# Patient Record
Sex: Female | Born: 1960 | Race: White | Hispanic: No | Smoking: Current every day smoker
Health system: Southern US, Community
[De-identification: ages and names within clinical notes are randomized; demographics above are authoritative.]

## PROBLEM LIST (undated history)

## (undated) DIAGNOSIS — G4733 Obstructive sleep apnea (adult) (pediatric): Secondary | ICD-10-CM

## (undated) DIAGNOSIS — F419 Anxiety disorder, unspecified: Secondary | ICD-10-CM

## (undated) DIAGNOSIS — G4730 Sleep apnea, unspecified: Secondary | ICD-10-CM

## (undated) DIAGNOSIS — G709 Myoneural disorder, unspecified: Secondary | ICD-10-CM

## (undated) DIAGNOSIS — D649 Anemia, unspecified: Secondary | ICD-10-CM

## (undated) DIAGNOSIS — F319 Bipolar disorder, unspecified: Secondary | ICD-10-CM

## (undated) DIAGNOSIS — N183 Chronic kidney disease, stage 3 unspecified: Secondary | ICD-10-CM

## (undated) DIAGNOSIS — F329 Major depressive disorder, single episode, unspecified: Secondary | ICD-10-CM

## (undated) DIAGNOSIS — F0390 Unspecified dementia without behavioral disturbance: Secondary | ICD-10-CM

## (undated) DIAGNOSIS — R569 Unspecified convulsions: Secondary | ICD-10-CM

## (undated) DIAGNOSIS — I639 Cerebral infarction, unspecified: Secondary | ICD-10-CM

## (undated) DIAGNOSIS — M1990 Unspecified osteoarthritis, unspecified site: Secondary | ICD-10-CM

## (undated) DIAGNOSIS — E0590 Thyrotoxicosis, unspecified without thyrotoxic crisis or storm: Secondary | ICD-10-CM

## (undated) DIAGNOSIS — N321 Vesicointestinal fistula: Secondary | ICD-10-CM

## (undated) DIAGNOSIS — G629 Polyneuropathy, unspecified: Secondary | ICD-10-CM

## (undated) DIAGNOSIS — Z87442 Personal history of urinary calculi: Secondary | ICD-10-CM

## (undated) DIAGNOSIS — F32A Depression, unspecified: Secondary | ICD-10-CM

## (undated) DIAGNOSIS — R519 Headache, unspecified: Secondary | ICD-10-CM

## (undated) DIAGNOSIS — R0600 Dyspnea, unspecified: Secondary | ICD-10-CM

## (undated) DIAGNOSIS — I2510 Atherosclerotic heart disease of native coronary artery without angina pectoris: Secondary | ICD-10-CM

## (undated) DIAGNOSIS — L4050 Arthropathic psoriasis, unspecified: Secondary | ICD-10-CM

## (undated) DIAGNOSIS — K219 Gastro-esophageal reflux disease without esophagitis: Secondary | ICD-10-CM

## (undated) HISTORY — PX: TOTAL HIP ARTHROPLASTY: SHX124

## (undated) HISTORY — DX: Sleep apnea, unspecified: G47.30

## (undated) HISTORY — DX: Obstructive sleep apnea (adult) (pediatric): G47.33

## (undated) HISTORY — PX: CHOLECYSTECTOMY: SHX55

## (undated) HISTORY — DX: Polyneuropathy, unspecified: G62.9

## (undated) HISTORY — PX: FOOT SURGERY: SHX648

## (undated) HISTORY — DX: Chronic kidney disease, stage 3 unspecified: N18.30

## (undated) HISTORY — PX: APPENDECTOMY: SHX54

## (undated) HISTORY — DX: Bipolar disorder, unspecified: F31.9

## (undated) HISTORY — PX: ABDOMINAL HYSTERECTOMY: SHX81

## (undated) HISTORY — DX: Depression, unspecified: F32.A

## (undated) HISTORY — DX: Unspecified osteoarthritis, unspecified site: M19.90

## (undated) HISTORY — DX: Arthropathic psoriasis, unspecified: L40.50

## (undated) HISTORY — PX: TONSILLECTOMY: SUR1361

## (undated) HISTORY — DX: Anxiety disorder, unspecified: F41.9

## (undated) HISTORY — DX: Major depressive disorder, single episode, unspecified: F32.9

---

## 2000-09-15 LAB — COLOGUARD: Cologuard: NEGATIVE

## 2014-10-29 DIAGNOSIS — I10 Essential (primary) hypertension: Secondary | ICD-10-CM | POA: Insufficient documentation

## 2014-11-05 DIAGNOSIS — M1611 Unilateral primary osteoarthritis, right hip: Secondary | ICD-10-CM | POA: Insufficient documentation

## 2015-12-10 ENCOUNTER — Ambulatory Visit (INDEPENDENT_AMBULATORY_CARE_PROVIDER_SITE_OTHER): Payer: Medicare Other

## 2015-12-10 ENCOUNTER — Ambulatory Visit (INDEPENDENT_AMBULATORY_CARE_PROVIDER_SITE_OTHER): Payer: Medicare Other | Admitting: Podiatry

## 2015-12-10 ENCOUNTER — Encounter: Payer: Self-pay | Admitting: Podiatry

## 2015-12-10 VITALS — BP 144/88 | HR 83 | Resp 16

## 2015-12-10 DIAGNOSIS — M2041 Other hammer toe(s) (acquired), right foot: Secondary | ICD-10-CM | POA: Diagnosis not present

## 2015-12-10 DIAGNOSIS — Q828 Other specified congenital malformations of skin: Secondary | ICD-10-CM | POA: Diagnosis not present

## 2015-12-10 DIAGNOSIS — M7751 Other enthesopathy of right foot: Secondary | ICD-10-CM | POA: Diagnosis not present

## 2015-12-10 DIAGNOSIS — M779 Enthesopathy, unspecified: Secondary | ICD-10-CM

## 2015-12-10 DIAGNOSIS — M778 Other enthesopathies, not elsewhere classified: Secondary | ICD-10-CM

## 2015-12-10 NOTE — Progress Notes (Signed)
   Subjective:    Patient ID: Valerie [name], female    DOB: February 24, 1961, 55 y.o.   MRN: [id number]  HPI: She presents today with a long history of a painful callus between the fourth and fifth digits of the right foot. She states that she has had her toenail technicians work on this occasionally which would alleviate the pain for only a day or so at a time. She states that she now she needs to have someone else look at it. She denies any trauma.    Review of Systems  Cardiovascular: Positive for leg swelling.  All other systems reviewed and are negative.      Objective:   Physical Exam: Vital signs are stable alert and oriented 3. Pulses are palpable. Neurologic sensory was intact. Deep tendon reflexes are intact and muscle strength is normal bilateral. Orthopedic evaluation and industries all joints distal to the ankle for range of motion without crepitation. She does have a very deep webspace fourth interdigital space. Radiograph confirms a very short fifth metatarsal resulting in a short fifth toe and a juxtaposition is resulting in the skin breakdown and pain on palpation of the fifth metatarsophalangeal joint. The hypertrophic callus between the fourth and fifth digits is deep within sulcus and there appears to be skin breakdown overlying the PIPJ medially. There is no signs of infection.        Assessment & Plan:  Assessment: Capsulitis fifth metatarsophalangeal joint and soft corn between the toes and a superficial ulceration.  Plan: I injected dexamethasone in in and about the fifth metatarsophalangeal joint. There is no erythema or edema saline as drainage or odor in this area. I then debrided all reactive hyperkeratoses and placed padding and made suggestions. I also discussed the need for possible surgery in the future. She understands this is amenable to it will follow up with me as needed.

## 2016-03-24 ENCOUNTER — Encounter: Payer: Self-pay | Admitting: Podiatry

## 2016-03-24 ENCOUNTER — Ambulatory Visit (INDEPENDENT_AMBULATORY_CARE_PROVIDER_SITE_OTHER): Payer: Medicare Other | Admitting: Podiatry

## 2016-03-24 ENCOUNTER — Other Ambulatory Visit: Payer: Self-pay | Admitting: Podiatry

## 2016-03-24 DIAGNOSIS — Q828 Other specified congenital malformations of skin: Secondary | ICD-10-CM | POA: Diagnosis not present

## 2016-03-24 DIAGNOSIS — M2041 Other hammer toe(s) (acquired), right foot: Secondary | ICD-10-CM

## 2016-03-24 DIAGNOSIS — Z1231 Encounter for screening mammogram for malignant neoplasm of breast: Secondary | ICD-10-CM

## 2016-03-24 NOTE — Patient Instructions (Signed)
Pre-Operative Instructions  Congratulations, you have decided to take an important step to improving your quality of life.  You can be assured that the doctors of [facility] will be with you every step of the way.  1. Plan to be at the surgery center/hospital at least 1 (one) hour prior to your scheduled time unless otherwise directed by the surgical center/hospital staff.  You must have a responsible adult accompany you, remain during the surgery and drive you home.  Make sure you have directions to the surgical center/hospital and know how to get there on time. 2. For hospital based surgery you will need to obtain a history and physical form from your family physician within 1 month prior to the date of surgery- we will give you a form for you primary physician.  3. We make every effort to accommodate the date you request for surgery.  There are however, times where surgery dates or times have to be moved.  We will contact you as soon as possible if a change in schedule is required.   4. No Aspirin/Ibuprofen for one week before surgery.  If you are on aspirin, any non-steroidal anti-inflammatory medications (Mobic, Aleve, Ibuprofen) you should stop taking it 7 days prior to your surgery.  You make take Tylenol  For pain prior to surgery.  5. Medications- If you are taking daily heart and blood pressure medications, seizure, reflux, allergy, asthma, anxiety, pain or diabetes medications, make sure the surgery center/hospital is aware before the day of surgery so they may notify you which medications to take or avoid the day of surgery. 6. No food or drink after midnight the night before surgery unless directed otherwise by surgical center/hospital staff. 7. No alcoholic beverages 24 hours prior to surgery.  No smoking 24 hours prior to or 24 hours after surgery. 8. Wear loose pants or shorts- loose enough to fit over bandages, boots, and casts. 9. No slip on shoes, sneakers are best. 10. Bring  your boot with you to the surgery center/hospital.  Also bring crutches or a walker if your physician has prescribed it for you.  If you do not have this equipment, it will be provided for you after surgery. 11. If you have not been contracted by the surgery center/hospital by the day before your surgery, call to confirm the date and time of your surgery. 12. Leave-time from work may vary depending on the type of surgery you have.  Appropriate arrangements should be made prior to surgery with your employer. 13. Prescriptions will be provided immediately following surgery by your doctor.  Have these filled as soon as possible after surgery and take the medication as directed. 14. Remove nail polish on the operative foot. 15. Wash the night before surgery.  The night before surgery wash the foot and leg well with the antibacterial soap provided and water paying special attention to beneath the toenails and in between the toes.  Rinse thoroughly with water and dry well with a towel.  Perform this wash unless told not to do so by your physician.  Enclosed: 1 Ice pack (please put in freezer the night before surgery)   1 Hibiclens skin cleaner   Pre-op Instructions  If you have any questions regarding the instructions, do not hesitate to call our office.  [place]: [address] , [place] [address] [phone]  [place]: [address] [place], [place] [address] [phone]  [place]: [address]  [place], [place] [address] [phone]   Dr.   Norman Regal DPM, Dr. Matthew Wagoner DPM, Dr. M. Todd Hyatt DPM, Dr. Titorya Stover DPM 

## 2016-03-24 NOTE — Progress Notes (Signed)
She presents today with a chief complaint of recurrence of the painful lesion to the fifth digit medial aspect right foot. She states that she would like to have this permanently corrected.  Objective: Vital signs are stable alert and oriented 3. Pulses are palpable. Neurologic sensorium is intact. Deep tendon reflexes are intact. Orthopedic evaluation was resolved just distal to the ankle for range of motion or crepitus she has adductovarus rotated hammertoe deformity fifth digit of the right foot which is resulting in a reactive hyperkeratotic lesion medial aspect of the fifth toe with pain.  Assessment: Chronic hammertoe deformity with adductovarus rotation and reactive hyperkeratotic/4. Keratosis.  Plan: We debrided the area of reactive hyperkeratosis today and consented her for surgical correction. We discussed orthoplasty fifth digit of the right foot we discussed the possible postop complications which may include but are not limited to postop pain bleeding swelling infection recurrence possible need for further surgery overcorrection under correction recurrence of the condition. Loss of digit loss of limb loss of life. She signed all 3 pages of the consent form and was dispensed a Darco shoe.

## 2016-04-06 ENCOUNTER — Telehealth: Payer: Self-pay | Admitting: *Deleted

## 2016-04-06 NOTE — Telephone Encounter (Signed)
"  The request from Dr. Milinda Pointer for Valerie [name] has been approved.  Request was approved for cpt code 28285 on August 14 to September 13 of 2017 as outpatient.  Authorization number is [id number].  If you have questions please contact [facility] at [phone] if you have additional questions.

## 2016-04-07 NOTE — Telephone Encounter (Signed)
I called Caren Griffins at surgical center and informed her of Authorization number.

## 2016-04-09 ENCOUNTER — Ambulatory Visit
Admission: RE | Admit: 2016-04-09 | Discharge: 2016-04-09 | Disposition: A | Discharge status: Home / Self Care | Payer: Medicare Other | Source: Ambulatory Visit | Attending: Podiatry | Admitting: Podiatry

## 2016-04-09 ENCOUNTER — Encounter: Payer: Self-pay | Admitting: Radiology

## 2016-04-09 DIAGNOSIS — Z1231 Encounter for screening mammogram for malignant neoplasm of breast: Secondary | ICD-10-CM

## 2016-04-16 ENCOUNTER — Other Ambulatory Visit: Payer: Self-pay | Admitting: Podiatry

## 2016-04-16 MED ORDER — HYDROCODONE-ACETAMINOPHEN 10-325 MG PO TABS: 1.0000 | ORAL_TABLET | Freq: Four times a day (QID) | ORAL | 0 refills | Status: DC | PRN

## 2016-04-16 MED ORDER — CLINDAMYCIN HCL 150 MG PO CAPS: 150.0000 mg | ORAL_CAPSULE | Freq: Three times a day (TID) | ORAL | 0 refills | Status: DC

## 2016-04-17 ENCOUNTER — Encounter: Payer: Self-pay | Admitting: Podiatry

## 2016-04-17 DIAGNOSIS — M2041 Other hammer toe(s) (acquired), right foot: Secondary | ICD-10-CM | POA: Diagnosis not present

## 2016-04-20 ENCOUNTER — Telehealth: Payer: Self-pay | Admitting: *Deleted

## 2016-04-20 NOTE — Telephone Encounter (Signed)
Post op courtesy call-Left message encouraging pt to read and follow the post op instruction sheet. I reiterated not to weight bear or have hove foot

## 2016-04-21 ENCOUNTER — Ambulatory Visit (INDEPENDENT_AMBULATORY_CARE_PROVIDER_SITE_OTHER): Payer: Medicare Other

## 2016-04-21 ENCOUNTER — Ambulatory Visit (INDEPENDENT_AMBULATORY_CARE_PROVIDER_SITE_OTHER): Payer: Medicare Other | Admitting: Podiatry

## 2016-04-21 ENCOUNTER — Encounter: Payer: Self-pay | Admitting: Podiatry

## 2016-04-21 VITALS — BP 145/92 | HR 74 | Resp 16

## 2016-04-21 DIAGNOSIS — M2041 Other hammer toe(s) (acquired), right foot: Secondary | ICD-10-CM

## 2016-04-21 DIAGNOSIS — Z9889 Other specified postprocedural states: Secondary | ICD-10-CM

## 2016-04-21 NOTE — Progress Notes (Unsigned)
DOS 08/252017 Arthroplasty 5th toe right foot.

## 2016-04-22 NOTE — Progress Notes (Signed)
She presents today for her first postop visit status post hammertoe repair fifth right. She states that she's doing just fine.  Objective: Vital signs are stable she is alert and oriented 3 no erythema edema saline as drainage or odor. Pulses are strongly palpable. Neurologic sensorium is intact. Redressed in a straighter complete arthroplasty PIPJ fifth right.  Assessment: Well-healing surgical toe less than 1 week.  Plan: She is to follow up with [place] in 1 week at which time sutures will probably not be removed. We may redress it and allow her to start washing.

## 2016-04-23 ENCOUNTER — Encounter: Payer: Self-pay | Admitting: Podiatry

## 2016-04-30 ENCOUNTER — Ambulatory Visit (INDEPENDENT_AMBULATORY_CARE_PROVIDER_SITE_OTHER): Payer: Medicare Other | Admitting: Podiatry

## 2016-04-30 ENCOUNTER — Encounter: Payer: Self-pay | Admitting: Podiatry

## 2016-04-30 DIAGNOSIS — M2041 Other hammer toe(s) (acquired), right foot: Secondary | ICD-10-CM

## 2016-04-30 DIAGNOSIS — Z9889 Other specified postprocedural states: Secondary | ICD-10-CM

## 2016-04-30 NOTE — Progress Notes (Signed)
She presents today 2 weeks status post hammertoe repair fifth right. She denies fever chills nausea vomiting muscle aches and pains.  Objective: Vital signs are stable alert and oriented 3. Pulses are palpable. Sutures are intact margins well coapted. We removed all of the sutures today margins remain well coapted. No signs of infection.  Assessment: Well-healing surgical toe fifth right.  Plan: I placed a compression dressing today and instructed her on how to do so. She will continue the use of the Darco shoe for least 2 more weeks I will follow up with her for another set of x-rays at that time.

## 2016-05-14 ENCOUNTER — Ambulatory Visit (INDEPENDENT_AMBULATORY_CARE_PROVIDER_SITE_OTHER): Payer: Medicare Other | Admitting: Podiatry

## 2016-05-14 ENCOUNTER — Ambulatory Visit (INDEPENDENT_AMBULATORY_CARE_PROVIDER_SITE_OTHER): Payer: Medicare Other

## 2016-05-14 DIAGNOSIS — M2041 Other hammer toe(s) (acquired), right foot: Secondary | ICD-10-CM | POA: Diagnosis not present

## 2016-05-14 DIAGNOSIS — Z9889 Other specified postprocedural states: Secondary | ICD-10-CM

## 2016-05-14 NOTE — Progress Notes (Signed)
She presents today one month status post hammertoe repair fifth digit right foot. She states this seems to be doing just fine and feels much better than it did previously and unable to wear shoes.  Objective: Vital signs are stable alert and oriented 3. Pulses are strongly palpable. Neurologic sensorium is intact. Mild edema to the fifth digit of the right foot no skin breakdown or reactive hyperkeratoses noted.  Assessment: Well-healing surgical toe right fifths.  Plan: Follow up with me in 1 month if necessary.

## 2016-06-11 ENCOUNTER — Ambulatory Visit (INDEPENDENT_AMBULATORY_CARE_PROVIDER_SITE_OTHER): Payer: Medicare Other

## 2016-06-11 ENCOUNTER — Ambulatory Visit (INDEPENDENT_AMBULATORY_CARE_PROVIDER_SITE_OTHER): Payer: Medicare Other | Admitting: Podiatry

## 2016-06-11 DIAGNOSIS — Z9889 Other specified postprocedural states: Secondary | ICD-10-CM

## 2016-06-11 DIAGNOSIS — M2041 Other hammer toe(s) (acquired), right foot: Secondary | ICD-10-CM | POA: Diagnosis not present

## 2016-06-13 NOTE — Progress Notes (Signed)
Subjective: Patient presents today status post hammertoe repair fifth digit right foot patient states that she's doing well  Objective: Mild edema noted to the fifth digit right foot. No skin breakdown or hypertrophic scar reaction.  Assessment: Status post fifth digit hammertoe repair right foot  Plan of care: Patient doing well and extremely satisfied. Complete healing has occurred exception of minimal edema. Patient is to return to clinic as needed

## 2016-07-13 ENCOUNTER — Ambulatory Visit (INDEPENDENT_AMBULATORY_CARE_PROVIDER_SITE_OTHER): Payer: Medicare Other

## 2016-07-13 ENCOUNTER — Encounter: Payer: Self-pay | Admitting: Podiatry

## 2016-07-13 ENCOUNTER — Ambulatory Visit (INDEPENDENT_AMBULATORY_CARE_PROVIDER_SITE_OTHER): Payer: Medicare Other | Admitting: Podiatry

## 2016-07-13 DIAGNOSIS — M7751 Other enthesopathy of right foot: Secondary | ICD-10-CM | POA: Diagnosis not present

## 2016-07-13 DIAGNOSIS — M778 Other enthesopathies, not elsewhere classified: Secondary | ICD-10-CM

## 2016-07-13 DIAGNOSIS — M779 Enthesopathy, unspecified: Secondary | ICD-10-CM

## 2016-07-13 DIAGNOSIS — M79671 Pain in right foot: Secondary | ICD-10-CM | POA: Diagnosis not present

## 2016-07-13 DIAGNOSIS — R52 Pain, unspecified: Secondary | ICD-10-CM

## 2016-07-13 DIAGNOSIS — M792 Neuralgia and neuritis, unspecified: Secondary | ICD-10-CM

## 2016-07-13 NOTE — Progress Notes (Signed)
Subjective: Patient presents to the office today for concerns of pain to the top of her right foot and for numbness and a sharp pain going in pain her first and second toes of her right foot. This been ongoing for about 1 week. Denies any recent injury or trauma. The pain started she was coming down steps but she denies twisting her foot or ankle or falling. She said no recent treatment for this. She is doing very well from the fifth toe. Denies any systemic complaints such as fevers, chills, nausea, vomiting. No acute changes since last appointment, and no other complaints at this time.   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 There is mild diffuse tenderness the dorsal aspect of the right midfoot along Lisfranc joint. There is trace edema to the area any erythema or increase in warmth. Subjective there is numbness/tingling to the big toe and second toe on the course of the deep peroneal nerve. There is no other areas tenderness bilaterally. No open lesions or pre-ulcerative lesions.  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  Plan: -All treatment options discussed with the patient including all alternatives, risks, complications.  -X-rays were obtained and reviewed with the patient. No evidence of acute fracture. Cavus foot type is present. -Discussed steroid injection she wishes to proceed. Under sterile conditions a mix of dexamethasone local anesthetic was infiltrated into the area of maximal tarsal complications. Post injection care was discussed. -Prescribed compound cream through [facility] for anti-inflammatory.  -Ice -Discussed shoe gear modifications and to re-lace her shoes to avoid pressure to the area. -Follow-up in 4 weeks if symptoms continue or sooner if needed. -Patient encouraged to call the office with any  questions, concerns, change in symptoms.   Celesta Gentile, DPM

## 2016-07-21 ENCOUNTER — Ambulatory Visit: Payer: Medicare Other | Admitting: Podiatry

## 2016-11-05 ENCOUNTER — Encounter: Payer: Self-pay | Admitting: Podiatry

## 2016-11-05 ENCOUNTER — Ambulatory Visit (INDEPENDENT_AMBULATORY_CARE_PROVIDER_SITE_OTHER): Payer: Medicare Other | Admitting: Podiatry

## 2016-11-05 DIAGNOSIS — M792 Neuralgia and neuritis, unspecified: Secondary | ICD-10-CM

## 2016-11-05 NOTE — Progress Notes (Signed)
She presents today with a chief complaint of pain to the dorsal aspect of the right foot. States that it feels like lightening bolt.   Objective: Chest pain on palpation deep peroneal nerve of the right foot with underlying osteoarthritic changes.  Assessment: Deep peroneal nerve neuritis.  Plan: Initiate her first dose of dehydrated alcohol to the deep peroneal nerve. Follow-up with her in 3-4 weeks for her second dose of necessary.

## 2016-12-03 ENCOUNTER — Ambulatory Visit: Payer: Medicare Other | Admitting: Podiatry

## 2017-01-25 ENCOUNTER — Other Ambulatory Visit: Payer: Self-pay | Admitting: Obstetrics and Gynecology

## 2017-01-25 DIAGNOSIS — Z1231 Encounter for screening mammogram for malignant neoplasm of breast: Secondary | ICD-10-CM

## 2017-04-12 ENCOUNTER — Ambulatory Visit
Admission: RE | Admit: 2017-04-12 | Discharge: 2017-04-12 | Disposition: A | Discharge status: Home / Self Care | Payer: Medicare Other | Source: Ambulatory Visit | Attending: Obstetrics and Gynecology | Admitting: Obstetrics and Gynecology

## 2017-04-12 DIAGNOSIS — Z1231 Encounter for screening mammogram for malignant neoplasm of breast: Secondary | ICD-10-CM

## 2018-02-03 ENCOUNTER — Ambulatory Visit: Payer: Medicare Other | Admitting: Podiatry

## 2018-02-08 ENCOUNTER — Ambulatory Visit: Payer: Medicare Other | Admitting: Podiatry

## 2018-02-08 ENCOUNTER — Encounter: Payer: Self-pay | Admitting: Podiatry

## 2018-02-08 ENCOUNTER — Encounter

## 2018-02-08 ENCOUNTER — Ambulatory Visit: Payer: Medicare Other

## 2018-02-08 ENCOUNTER — Telehealth: Payer: Self-pay | Admitting: *Deleted

## 2018-02-08 DIAGNOSIS — M7989 Other specified soft tissue disorders: Secondary | ICD-10-CM

## 2018-02-08 DIAGNOSIS — Z01812 Encounter for preprocedural laboratory examination: Secondary | ICD-10-CM

## 2018-02-08 DIAGNOSIS — M799 Soft tissue disorder, unspecified: Secondary | ICD-10-CM | POA: Diagnosis not present

## 2018-02-08 DIAGNOSIS — M67479 Ganglion, unspecified ankle and foot: Secondary | ICD-10-CM

## 2018-02-08 LAB — BUN/CREATININE RATIO
BUN/Creatinine Ratio: 16 (calc) (ref 6–22)
BUN: 28 mg/dL — AB (ref 7–25)
CREATININE: 1.73 mg/dL — AB (ref 0.50–1.05)
GFR, EST AFRICAN AMERICAN: 37 mL/min/{1.73_m2} — AB (ref >=60)
GFR, EST NON AFRICAN AMERICAN: 32 mL/min/{1.73_m2} — AB (ref >=60)

## 2018-02-08 NOTE — Progress Notes (Signed)
She presents today with a chief complaint of a painful area to the dorsal aspect of the right foot.  She says I think have another cyst or something and is sending lightening bolts to the top of my foot down to my toes that she points to the first and second toes of the right foot.  She denies any trauma to the foot.  Objective: Vital signs are stable she is alert and oriented x3.  Pulses are palpable.  Neurologic sensorium is intact.  Degenerative flexors are intact.  Muscle strength is normal symmetrical bilateral.  She has pain on palpation of the deep peroneal nerve as it courses across the dorsum of the foot but exquisitely painful between the first intermetatarsal spaces.  There is a small mass in the first intermetatarsal space appears to be fluctuant sort of like a ganglion or a bursa but too painful on palpation to try to aspirate.  Typically ganglion cyst is not painful to palpate.  Assessment: Soft tissue mass first intermetatarsal space right foot.  Cannot rule out a neural mass or ganglion cyst.  Plan: Requesting an MRI with contrast we will look at her BUN and creatinine because of her history of kidney disease.  I will follow-up with her once that comes in.

## 2018-02-08 NOTE — Addendum Note (Signed)
Addended by: Clovis Riley E on: 02/08/2018 10:33 AM   Modules accepted: Orders

## 2018-02-08 NOTE — Telephone Encounter (Signed)
-----   Message from Rip Harbour, [facility] sent at 02/08/2018  9:54 AM EDT ----- Regarding: MRI MRI with contrast right - evaluate 1st intermetatarsal space, soft tissue tumor - surgical consideration  Patient sent for BUN and Creat.

## 2018-02-08 NOTE — Telephone Encounter (Signed)
Orders to [facility] for pre-cert and faxed to [place].

## 2018-02-09 ENCOUNTER — Telehealth: Payer: Self-pay | Admitting: *Deleted

## 2018-02-09 DIAGNOSIS — M7989 Other specified soft tissue disorders: Secondary | ICD-10-CM

## 2018-02-09 NOTE — Telephone Encounter (Signed)
-----   Message from Garrel Ridgel, [place] sent at 02/09/2018  6:57 AM EDT ----- She is probably better of not to have the contrast.  Might want to call her doc and let them decide.

## 2018-02-09 NOTE — Telephone Encounter (Signed)
Changed pt's MRI to without contrast.

## 2018-02-09 NOTE — Telephone Encounter (Signed)
Faxed notice to cancel the MRI with and Without contrast for the right foot, and change to the MRI without contrast of the right foot.

## 2018-02-17 ENCOUNTER — Telehealth: Payer: Self-pay | Admitting: Podiatry

## 2018-02-17 NOTE — Telephone Encounter (Signed)
Orders that was sent needs to be changed per [facility] -to MRI right foot with & without contrast.

## 2018-02-17 NOTE — Telephone Encounter (Signed)
I informed [facility] Dr. Milinda Pointer had okayed wo contrast.

## 2018-02-28 ENCOUNTER — Other Ambulatory Visit: Payer: Self-pay | Admitting: Obstetrics and Gynecology

## 2018-02-28 DIAGNOSIS — Z1231 Encounter for screening mammogram for malignant neoplasm of breast: Secondary | ICD-10-CM

## 2018-03-07 ENCOUNTER — Ambulatory Visit
Admission: RE | Admit: 2018-03-07 | Discharge: 2018-03-07 | Disposition: A | Discharge status: Home / Self Care | Payer: Medicare Other | Source: Ambulatory Visit | Attending: Podiatry | Admitting: Podiatry

## 2018-03-07 DIAGNOSIS — M7989 Other specified soft tissue disorders: Secondary | ICD-10-CM

## 2018-03-09 ENCOUNTER — Ambulatory Visit: Payer: Medicare Other | Admitting: Podiatry

## 2018-03-09 DIAGNOSIS — M898X9 Other specified disorders of bone, unspecified site: Secondary | ICD-10-CM

## 2018-03-09 DIAGNOSIS — M7989 Other specified soft tissue disorders: Secondary | ICD-10-CM

## 2018-03-09 DIAGNOSIS — M799 Soft tissue disorder, unspecified: Secondary | ICD-10-CM

## 2018-03-09 NOTE — Progress Notes (Signed)
She presents today with chief complaint of painful radiating pain from the dorsal aspect of her right foot through the first intermetatarsal space and out to her toes.  She is also here for her MRI review.  Objective: Vital signs are stable she is alert and oriented x3 reproducible pain on palpation to the dorsal aspect of the arthritic midfoot.  She also has a palpable mass between the first and second metatarsals.  Palpation of this mass is exquisitely painful with radiating pain.  MRI does demonstrate soft tissue mass in the first intermetatarsal space consistent with a ganglion cyst also demonstrates a entrapment of the deep peroneal nerve.  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  Plan: Discussed etiology pathology conservative surgical therapies at this point I am going to consent her for decompression of the nerve consisting of a dorsal tarsal exostectomy as well as a excision ganglion cyst.  We discussed the pros and cons of the surgery and possible complications associated with that she understands is amenable to it signed all 3 pages a consent form dispensed both oral and written home-going instruction for care and preop as well as anesthesia group and surgery center.  Follow-up with her in the near future.  Dispensed a Cam walker.

## 2018-03-09 NOTE — Patient Instructions (Signed)
Pre-Operative Instructions  Congratulations, you have decided to take an important step towards improving your quality of life.  You can be assured that the doctors and staff at [facility] will be with you every step of the way.  Here are some important things you should know:  1. Plan to be at the surgery center/hospital at least 1 (one) hour prior to your scheduled time, unless otherwise directed by the surgical center/hospital staff.  You must have a responsible adult accompany you, remain during the surgery and drive you home.  Make sure you have directions to the surgical center/hospital to ensure you arrive on time. 2. If you are having surgery at [facility] or [facility], you will need a copy of your medical history and physical form from your family physician within one month prior to the date of surgery. We will give you a form for your primary physician to complete.  3. We make every effort to accommodate the date you request for surgery.  However, there are times where surgery dates or times have to be moved.  We will contact you as soon as possible if a change in schedule is required.   4. No aspirin/ibuprofen for one week before surgery.  If you are on aspirin, any non-steroidal anti-inflammatory medications (Mobic, Aleve, Ibuprofen) should not be taken seven (7) days prior to your surgery.  You make take Tylenol for pain prior to surgery.  5. Medications - If you are taking daily heart and blood pressure medications, seizure, reflux, allergy, asthma, anxiety, pain or diabetes medications, make sure you notify the surgery center/hospital before the day of surgery so they can tell you which medications you should take or avoid the day of surgery. 6. No food or drink after midnight the night before surgery unless directed otherwise by surgical center/hospital staff. 7. No alcoholic beverages 24-hours prior to surgery.  No smoking 24-hours prior or 24-hours after  surgery. 8. Wear loose pants or shorts. They should be loose enough to fit over bandages, boots, and casts. 9. Don't wear slip-on shoes. Sneakers are preferred. 10. Bring your boot with you to the surgery center/hospital.  Also bring crutches or a walker if your physician has prescribed it for you.  If you do not have this equipment, it will be provided for you after surgery. 11. If you have not been contacted by the surgery center/hospital by the day before your surgery, call to confirm the date and time of your surgery. 12. Leave-time from work may vary depending on the type of surgery you have.  Appropriate arrangements should be made prior to surgery with your employer. 13. Prescriptions will be provided immediately following surgery by your doctor.  Fill these as soon as possible after surgery and take the medication as directed. Pain medications will not be refilled on weekends and must be approved by the doctor. 14. Remove nail polish on the operative foot and avoid getting pedicures prior to surgery. 15. Wash the night before surgery.  The night before surgery wash the foot and leg well with water and the antibacterial soap provided. Be sure to pay special attention to beneath the toenails and in between the toes.  Wash for at least three (3) minutes. Rinse thoroughly with water and dry well with a towel.  Perform this wash unless told not to do so by your physician.  Enclosed: 1 Ice pack (please put in freezer the night before surgery)   1 Hibiclens skin cleaner     Pre-op instructions  If you have any questions regarding the instructions, please do not hesitate to call our office.  [place]: [address], [place], [place] [address] -- [phone]  [place]: [address] [place], [place] [address] -- [phone]  [place]: [address]  [place], [place] [address] -- [phone]  [place]: [address], [place], [place] [address] -- [phone]  Website:  [email or web address] 

## 2018-03-10 ENCOUNTER — Telehealth: Payer: Self-pay | Admitting: *Deleted

## 2018-03-10 NOTE — Telephone Encounter (Signed)
"  I saw Dr. Milinda Pointer yesterday.  He told me to give you a call to schedule my surgery."  Do you have a date in mind?  He can do it on July 26, August 2,9.  "Let's schedule it for July 26.  Can I go ahead and go on-line to pre-register or do I need to wait until you get it scheduled?"  You can go ahead and register with the surgical center now if you like.

## 2018-03-16 ENCOUNTER — Other Ambulatory Visit: Payer: Self-pay | Admitting: Podiatry

## 2018-03-16 MED ORDER — OXYCODONE-ACETAMINOPHEN 10-325 MG PO TABS: 1.0000 | ORAL_TABLET | Freq: Three times a day (TID) | ORAL | 0 refills | Status: AC | PRN

## 2018-03-16 MED ORDER — ONDANSETRON HCL 4 MG PO TABS: 4.0000 mg | ORAL_TABLET | Freq: Three times a day (TID) | ORAL | 0 refills | Status: DC | PRN

## 2018-03-16 MED ORDER — CEPHALEXIN 500 MG PO CAPS: 500.0000 mg | ORAL_CAPSULE | Freq: Three times a day (TID) | ORAL | 0 refills | Status: DC

## 2018-03-18 ENCOUNTER — Encounter: Payer: Self-pay | Admitting: Podiatry

## 2018-03-18 DIAGNOSIS — M67471 Ganglion, right ankle and foot: Secondary | ICD-10-CM | POA: Diagnosis not present

## 2018-03-18 DIAGNOSIS — M25774 Osteophyte, right foot: Secondary | ICD-10-CM | POA: Diagnosis not present

## 2018-03-21 ENCOUNTER — Telehealth: Payer: Self-pay | Admitting: *Deleted

## 2018-03-21 NOTE — Telephone Encounter (Signed)
LEFT MESSAGE FOR PT TO CALL IF SHE HAD ANY CONCERNS OR QUESTIONS

## 2018-03-23 ENCOUNTER — Encounter: Payer: Self-pay | Admitting: Podiatry

## 2018-03-24 ENCOUNTER — Ambulatory Visit (INDEPENDENT_AMBULATORY_CARE_PROVIDER_SITE_OTHER): Payer: Self-pay | Admitting: Podiatry

## 2018-03-24 ENCOUNTER — Ambulatory Visit (INDEPENDENT_AMBULATORY_CARE_PROVIDER_SITE_OTHER): Payer: Medicare Other

## 2018-03-24 DIAGNOSIS — M792 Neuralgia and neuritis, unspecified: Secondary | ICD-10-CM

## 2018-03-24 DIAGNOSIS — M898X9 Other specified disorders of bone, unspecified site: Secondary | ICD-10-CM | POA: Diagnosis not present

## 2018-03-26 NOTE — Progress Notes (Signed)
She presents today for her first first postop visit date of surgery 03/18/2018 excision ganglion cyst and dorsal tarsal exostectomy.  She states that it seems to be feeling fine I am only using the pain medication at nighttime.  Objective: Vital signs are stable alert and oriented x3.  Pulses are palpable.  Neurologic sensorium is intact.  Deep tendon reflexes are intact muscle strength is normal symmetrical bilateral.  Orthopedic evaluation demonstrates all joints distal to the ankle full range of motion without crepitation.  Dry sterile dressing was removed demonstrates no erythematous mild edema no cellulitis drainage or odor sutures are intact margins well coapted.  Radiographs demonstrate dorsal exostectomy with fluid retention beneath the skin.  Assessment: Well-healing surgical foot.  Plan: Redressed today dressed a compressive dressing to the right foot follow-up with her in 1 week she is to continue the use of her cam walker.  She will continue to keep this elevated dry and clean.

## 2018-03-31 ENCOUNTER — Ambulatory Visit (INDEPENDENT_AMBULATORY_CARE_PROVIDER_SITE_OTHER): Payer: Self-pay | Admitting: Podiatry

## 2018-03-31 DIAGNOSIS — Z9889 Other specified postprocedural states: Secondary | ICD-10-CM

## 2018-03-31 DIAGNOSIS — M792 Neuralgia and neuritis, unspecified: Secondary | ICD-10-CM

## 2018-03-31 DIAGNOSIS — M898X9 Other specified disorders of bone, unspecified site: Secondary | ICD-10-CM

## 2018-03-31 MED ORDER — DOXYCYCLINE HYCLATE 100 MG PO TABS: 100.0000 mg | ORAL_TABLET | Freq: Two times a day (BID) | ORAL | 0 refills | Status: DC

## 2018-03-31 NOTE — Progress Notes (Signed)
She presents today date of surgery 03/18/2018 excision ganglion and dorsal tarsal exostectomy.  States that the dorsal tarsal exostectomy part is exquisitely tender.  Objective: Vital signs are stable alert oriented x3.  Pulses are palpable.  Sutures are intact margins well coapted is mildly erythematous and edematous.  Tender on palpation.  No purulence no malodor.  Assessment: Dorsal tarsal exostectomy with mild erythema and edema cannot rule out superficial infection.  Plan: Started her on doxycycline redressed today dressed a compressive dressing we will follow-up with her in 1 week to make sure she is doing better.  Hope to remove the sutures at that time.  I did place her a small Darco shoe.

## 2018-04-07 ENCOUNTER — Encounter: Payer: Self-pay | Admitting: Podiatry

## 2018-04-07 ENCOUNTER — Ambulatory Visit (INDEPENDENT_AMBULATORY_CARE_PROVIDER_SITE_OTHER): Payer: Medicare Other | Admitting: Podiatry

## 2018-04-07 DIAGNOSIS — M898X9 Other specified disorders of bone, unspecified site: Secondary | ICD-10-CM

## 2018-04-09 NOTE — Progress Notes (Signed)
She presents today date of surgery 03/18/2018 with excision ganglion cyst and dorsal tarsal exostectomy.  She states that still has some numbness and feels like it swells a lot at times she says particularly after working my foot and toes sort of go numb.  Objective: Vital signs are stable she is alert and oriented x3.  Moderate edema to the right foot.  There is still some tenderness on palpation of the incision sites.  I removed the sutures today margins remain closed however I did go ahead and place Steri-Strips secondary to the swelling.  I am allow her to try to get back into some regular shoe gear over the next few days should she have questions or concerns she will notify us.  Assessment: Well-healing surgical foot right.  Plan: Follow-up with me in a couple of weeks.

## 2018-04-18 ENCOUNTER — Ambulatory Visit
Admission: RE | Admit: 2018-04-18 | Discharge: 2018-04-18 | Disposition: A | Discharge status: Home / Self Care | Payer: Medicare Other | Source: Ambulatory Visit | Attending: Obstetrics and Gynecology | Admitting: Obstetrics and Gynecology

## 2018-04-18 DIAGNOSIS — Z1231 Encounter for screening mammogram for malignant neoplasm of breast: Secondary | ICD-10-CM

## 2018-04-28 ENCOUNTER — Encounter: Payer: Self-pay | Admitting: Podiatry

## 2018-04-28 ENCOUNTER — Ambulatory Visit (INDEPENDENT_AMBULATORY_CARE_PROVIDER_SITE_OTHER): Payer: Medicare Other | Admitting: Podiatry

## 2018-04-28 DIAGNOSIS — Z9889 Other specified postprocedural states: Secondary | ICD-10-CM

## 2018-04-28 DIAGNOSIS — M898X9 Other specified disorders of bone, unspecified site: Secondary | ICD-10-CM

## 2018-04-30 NOTE — Progress Notes (Signed)
She presents today date of surgery 03/18/2018 status post excision ganglion cyst between the first and second toes metatarsals of the right foot.  With decompression of the deep peroneal nerve.  Also aches dorsal tarsal exostectomy.  States that is been a little sore on the top of the foot but between the metatarsals is doing great.  Objective: Vital signs are stable she is alert and oriented x3.  Presents today in a pair shoes that has a tight strap that crosses over the surgical site.  She states that she has been wearing this recently.  All surgical sites appear to be healing very well.  Assessment: More than likely poor choice and shoe gear for postsurgical site.  Will up with me in about a month to 2 months.

## 2018-06-28 ENCOUNTER — Ambulatory Visit: Payer: Medicare Other | Admitting: Podiatry

## 2019-03-16 ENCOUNTER — Other Ambulatory Visit: Payer: Self-pay | Admitting: Obstetrics and Gynecology

## 2019-03-16 DIAGNOSIS — Z1231 Encounter for screening mammogram for malignant neoplasm of breast: Secondary | ICD-10-CM

## 2019-05-03 ENCOUNTER — Ambulatory Visit
Admission: RE | Admit: 2019-05-03 | Discharge: 2019-05-03 | Disposition: A | Discharge status: Home / Self Care | Payer: Medicare Other | Source: Ambulatory Visit | Attending: Obstetrics and Gynecology | Admitting: Obstetrics and Gynecology

## 2019-05-03 ENCOUNTER — Other Ambulatory Visit: Payer: Self-pay

## 2019-05-03 DIAGNOSIS — Z1231 Encounter for screening mammogram for malignant neoplasm of breast: Secondary | ICD-10-CM

## 2019-11-13 ENCOUNTER — Observation Stay (HOSPITAL_COMMUNITY)
Admission: EM | Admit: 2019-11-13 | Discharge: 2019-11-14 | Disposition: A | Discharge status: Home / Self Care | Payer: Medicare Other | Attending: Family Medicine | Admitting: Family Medicine

## 2019-11-13 ENCOUNTER — Emergency Department (HOSPITAL_COMMUNITY): Payer: Medicare Other

## 2019-11-13 ENCOUNTER — Other Ambulatory Visit: Payer: Self-pay

## 2019-11-13 DIAGNOSIS — L4050 Arthropathic psoriasis, unspecified: Secondary | ICD-10-CM | POA: Diagnosis not present

## 2019-11-13 DIAGNOSIS — F319 Bipolar disorder, unspecified: Secondary | ICD-10-CM | POA: Diagnosis not present

## 2019-11-13 DIAGNOSIS — N1832 Chronic kidney disease, stage 3b: Secondary | ICD-10-CM | POA: Diagnosis present

## 2019-11-13 DIAGNOSIS — Z7982 Long term (current) use of aspirin: Secondary | ICD-10-CM | POA: Insufficient documentation

## 2019-11-13 DIAGNOSIS — E119 Type 2 diabetes mellitus without complications: Secondary | ICD-10-CM

## 2019-11-13 DIAGNOSIS — F329 Major depressive disorder, single episode, unspecified: Secondary | ICD-10-CM | POA: Diagnosis not present

## 2019-11-13 DIAGNOSIS — Z20822 Contact with and (suspected) exposure to covid-19: Secondary | ICD-10-CM | POA: Diagnosis not present

## 2019-11-13 DIAGNOSIS — F32A Depression, unspecified: Secondary | ICD-10-CM | POA: Diagnosis present

## 2019-11-13 DIAGNOSIS — E66812 Obesity, class 2: Secondary | ICD-10-CM | POA: Diagnosis present

## 2019-11-13 DIAGNOSIS — K219 Gastro-esophageal reflux disease without esophagitis: Secondary | ICD-10-CM | POA: Diagnosis not present

## 2019-11-13 DIAGNOSIS — E1142 Type 2 diabetes mellitus with diabetic polyneuropathy: Secondary | ICD-10-CM | POA: Diagnosis not present

## 2019-11-13 DIAGNOSIS — G4733 Obstructive sleep apnea (adult) (pediatric): Secondary | ICD-10-CM | POA: Diagnosis not present

## 2019-11-13 DIAGNOSIS — Z6841 Body Mass Index (BMI) 40.0 and over, adult: Secondary | ICD-10-CM | POA: Diagnosis not present

## 2019-11-13 DIAGNOSIS — R072 Precordial pain: Secondary | ICD-10-CM

## 2019-11-13 DIAGNOSIS — I129 Hypertensive chronic kidney disease with stage 1 through stage 4 chronic kidney disease, or unspecified chronic kidney disease: Secondary | ICD-10-CM | POA: Insufficient documentation

## 2019-11-13 DIAGNOSIS — I2510 Atherosclerotic heart disease of native coronary artery without angina pectoris: Secondary | ICD-10-CM | POA: Insufficient documentation

## 2019-11-13 DIAGNOSIS — Z7952 Long term (current) use of systemic steroids: Secondary | ICD-10-CM | POA: Insufficient documentation

## 2019-11-13 DIAGNOSIS — R079 Chest pain, unspecified: Secondary | ICD-10-CM | POA: Diagnosis present

## 2019-11-13 DIAGNOSIS — Z8249 Family history of ischemic heart disease and other diseases of the circulatory system: Secondary | ICD-10-CM | POA: Diagnosis not present

## 2019-11-13 DIAGNOSIS — F419 Anxiety disorder, unspecified: Secondary | ICD-10-CM | POA: Diagnosis present

## 2019-11-13 DIAGNOSIS — I214 Non-ST elevation (NSTEMI) myocardial infarction: Secondary | ICD-10-CM | POA: Diagnosis not present

## 2019-11-13 DIAGNOSIS — Z79899 Other long term (current) drug therapy: Secondary | ICD-10-CM | POA: Diagnosis not present

## 2019-11-13 DIAGNOSIS — F17210 Nicotine dependence, cigarettes, uncomplicated: Secondary | ICD-10-CM | POA: Insufficient documentation

## 2019-11-13 DIAGNOSIS — G629 Polyneuropathy, unspecified: Secondary | ICD-10-CM

## 2019-11-13 DIAGNOSIS — Z720 Tobacco use: Secondary | ICD-10-CM | POA: Diagnosis present

## 2019-11-13 DIAGNOSIS — N183 Chronic kidney disease, stage 3 unspecified: Secondary | ICD-10-CM | POA: Diagnosis present

## 2019-11-13 DIAGNOSIS — E1122 Type 2 diabetes mellitus with diabetic chronic kidney disease: Secondary | ICD-10-CM | POA: Insufficient documentation

## 2019-11-13 LAB — URINALYSIS, ROUTINE W REFLEX MICROSCOPIC
Bilirubin Urine: NEGATIVE
Glucose, UA: NEGATIVE mg/dL
Ketones, ur: NEGATIVE mg/dL
Nitrite: POSITIVE — AB
Protein, ur: NEGATIVE mg/dL
Specific Gravity, Urine: 1.01 (ref 1.005–1.030)
WBC, UA: >50 WBC/hpf — ABNORMAL HIGH (ref 0–5)
pH: 6 (ref 5.0–8.0)

## 2019-11-13 LAB — COMPREHENSIVE METABOLIC PANEL
ALT: 20 U/L (ref 0–44)
AST: 18 U/L (ref 15–41)
Albumin: 3 g/dL — ABNORMAL LOW (ref 3.5–5.0)
Alkaline Phosphatase: 54 U/L (ref 38–126)
Anion gap: 8 (ref 5–15)
BUN: 26 mg/dL — ABNORMAL HIGH (ref 6–20)
CO2: 25 mmol/L (ref 22–32)
Calcium: 10.4 mg/dL — ABNORMAL HIGH (ref 8.9–10.3)
Chloride: 109 mmol/L (ref 98–111)
Creatinine, Ser: 1.54 mg/dL — ABNORMAL HIGH (ref 0.44–1.00)
GFR calc Af Amer: 42 mL/min — ABNORMAL LOW (ref >60)
GFR calc non Af Amer: 37 mL/min — ABNORMAL LOW (ref >60)
Glucose, Bld: 108 mg/dL — ABNORMAL HIGH (ref 70–99)
Potassium: 4.5 mmol/L (ref 3.5–5.1)
Sodium: 142 mmol/L (ref 135–145)
Total Bilirubin: 0.5 mg/dL (ref 0.3–1.2)
Total Protein: 5.6 g/dL — ABNORMAL LOW (ref 6.5–8.1)

## 2019-11-13 LAB — CBC WITH DIFFERENTIAL/PLATELET
Abs Immature Granulocytes: 0.2 10*3/uL — ABNORMAL HIGH (ref 0.00–0.07)
Basophils Absolute: 0 10*3/uL (ref 0.0–0.1)
Basophils Relative: 0 %
Eosinophils Absolute: 0 10*3/uL (ref 0.0–0.5)
Eosinophils Relative: 0 %
HCT: 38.7 % (ref 36.0–46.0)
Hemoglobin: 12.3 g/dL (ref 12.0–15.0)
Immature Granulocytes: 2 %
Lymphocytes Relative: 9 %
Lymphs Abs: 1 10*3/uL (ref 0.7–4.0)
MCH: 34.9 pg — ABNORMAL HIGH (ref 26.0–34.0)
MCHC: 31.8 g/dL (ref 30.0–36.0)
MCV: 109.9 fL — ABNORMAL HIGH (ref 80.0–100.0)
Monocytes Absolute: 0.9 10*3/uL (ref 0.1–1.0)
Monocytes Relative: 8 %
Neutro Abs: 8.3 10*3/uL — ABNORMAL HIGH (ref 1.7–7.7)
Neutrophils Relative %: 81 %
Platelets: 164 10*3/uL (ref 150–400)
RBC: 3.52 MIL/uL — ABNORMAL LOW (ref 3.87–5.11)
RDW: 15.8 % — ABNORMAL HIGH (ref 11.5–15.5)
WBC: 10.4 10*3/uL (ref 4.0–10.5)
nRBC: 0.2 % (ref 0.0–0.2)

## 2019-11-13 LAB — CBC
HCT: 43.4 % (ref 36.0–46.0)
Hemoglobin: 13.5 g/dL (ref 12.0–15.0)
MCH: 33.9 pg (ref 26.0–34.0)
MCHC: 31.1 g/dL (ref 30.0–36.0)
MCV: 109 fL — ABNORMAL HIGH (ref 80.0–100.0)
Platelets: 201 10*3/uL (ref 150–400)
RBC: 3.98 MIL/uL (ref 3.87–5.11)
RDW: 15.7 % — ABNORMAL HIGH (ref 11.5–15.5)
WBC: 16.1 10*3/uL — ABNORMAL HIGH (ref 4.0–10.5)
nRBC: 0.1 % (ref 0.0–0.2)

## 2019-11-13 LAB — CREATININE, SERUM
Creatinine, Ser: 1.72 mg/dL — ABNORMAL HIGH (ref 0.44–1.00)
GFR calc Af Amer: 37 mL/min — ABNORMAL LOW (ref >60)
GFR calc non Af Amer: 32 mL/min — ABNORMAL LOW (ref >60)

## 2019-11-13 LAB — TROPONIN I (HIGH SENSITIVITY)
Troponin I (High Sensitivity): 71 ng/L — ABNORMAL HIGH (ref <18)
Troponin I (High Sensitivity): 218 ng/L (ref <18)

## 2019-11-13 LAB — SARS CORONAVIRUS 2 (TAT 6-24 HRS): SARS Coronavirus 2: NEGATIVE

## 2019-11-13 LAB — MAGNESIUM: Magnesium: 2.2 mg/dL (ref 1.7–2.4)

## 2019-11-13 LAB — LIPASE, BLOOD: Lipase: 26 U/L (ref 11–51)

## 2019-11-13 MED ORDER — MORPHINE SULFATE (PF) 2 MG/ML IV SOLN: 2.0000 mg | INTRAVENOUS | Status: DC | PRN

## 2019-11-13 MED ORDER — ESCITALOPRAM OXALATE 10 MG PO TABS
30.0000 mg | ORAL_TABLET | Freq: Every day | ORAL | Status: DC
  Administered 2019-11-13 – 2019-11-14 (×2): 30 mg via ORAL
  Filled 2019-11-13 (×2): qty 3

## 2019-11-13 MED ORDER — RISAQUAD PO CAPS
1.0000 | ORAL_CAPSULE | Freq: Every day | ORAL | Status: DC
  Administered 2019-11-13: 1 via ORAL
  Filled 2019-11-13 (×2): qty 1

## 2019-11-13 MED ORDER — PANTOPRAZOLE SODIUM 40 MG PO TBEC
40.0000 mg | DELAYED_RELEASE_TABLET | Freq: Every day | ORAL | Status: DC
  Administered 2019-11-13 – 2019-11-14 (×2): 40 mg via ORAL
  Filled 2019-11-13 (×2): qty 1

## 2019-11-13 MED ORDER — NADOLOL 20 MG PO TABS
20.0000 mg | ORAL_TABLET | Freq: Every day | ORAL | Status: DC
  Administered 2019-11-13: 20 mg via ORAL
  Filled 2019-11-13 (×2): qty 1

## 2019-11-13 MED ORDER — ACETAMINOPHEN 325 MG PO TABS: 650.0000 mg | ORAL_TABLET | Freq: Four times a day (QID) | ORAL | Status: DC | PRN

## 2019-11-13 MED ORDER — RIZATRIPTAN BENZOATE 10 MG PO TBDP: 10.0000 mg | ORAL_TABLET | ORAL | Status: DC

## 2019-11-13 MED ORDER — LAMOTRIGINE 25 MG PO TABS
75.0000 mg | ORAL_TABLET | Freq: Every morning | ORAL | Status: DC
  Administered 2019-11-14: 75 mg via ORAL
  Filled 2019-11-13: qty 3

## 2019-11-13 MED ORDER — ENOXAPARIN SODIUM 40 MG/0.4ML ~~LOC~~ SOLN
40.0000 mg | SUBCUTANEOUS | Status: DC
  Administered 2019-11-13: 40 mg via SUBCUTANEOUS
  Filled 2019-11-13: qty 0.4

## 2019-11-13 MED ORDER — HYDROXYCHLOROQUINE SULFATE 200 MG PO TABS
200.0000 mg | ORAL_TABLET | Freq: Two times a day (BID) | ORAL | Status: DC
  Administered 2019-11-13 – 2019-11-14 (×2): 200 mg via ORAL
  Filled 2019-11-13 (×3): qty 1

## 2019-11-13 MED ORDER — DONEPEZIL HCL 10 MG PO TABS
10.0000 mg | ORAL_TABLET | Freq: Every day | ORAL | Status: DC
  Administered 2019-11-13 – 2019-11-14 (×2): 10 mg via ORAL
  Filled 2019-11-13 (×2): qty 1

## 2019-11-13 MED ORDER — DOCUSATE SODIUM 100 MG PO CAPS
200.0000 mg | ORAL_CAPSULE | Freq: Every day | ORAL | Status: DC
  Administered 2019-11-13: 200 mg via ORAL
  Filled 2019-11-13: qty 2

## 2019-11-13 MED ORDER — ACETAMINOPHEN 650 MG RE SUPP: 650.0000 mg | Freq: Four times a day (QID) | RECTAL | Status: DC | PRN

## 2019-11-13 MED ORDER — FLUCONAZOLE 100 MG PO TABS
100.0000 mg | ORAL_TABLET | Freq: Every day | ORAL | Status: DC
  Administered 2019-11-13 – 2019-11-14 (×2): 100 mg via ORAL
  Filled 2019-11-13 (×2): qty 1

## 2019-11-13 MED ORDER — ONDANSETRON HCL 4 MG PO TABS: 4.0000 mg | ORAL_TABLET | Freq: Four times a day (QID) | ORAL | Status: DC | PRN

## 2019-11-13 MED ORDER — ALPRAZOLAM 0.5 MG PO TABS: 0.5000 mg | ORAL_TABLET | Freq: Three times a day (TID) | ORAL | Status: DC | PRN

## 2019-11-13 MED ORDER — METHOTREXATE 2.5 MG PO TABS
10.0000 mg | ORAL_TABLET | ORAL | Status: DC
  Administered 2019-11-13: 10 mg via ORAL
  Filled 2019-11-13 (×2): qty 4

## 2019-11-13 MED ORDER — PREDNISONE 10 MG PO TABS
10.0000 mg | ORAL_TABLET | Freq: Every day | ORAL | Status: DC
  Administered 2019-11-13 – 2019-11-14 (×2): 10 mg via ORAL
  Filled 2019-11-13 (×2): qty 1

## 2019-11-13 MED ORDER — LAMOTRIGINE 100 MG PO TABS
150.0000 mg | ORAL_TABLET | Freq: Every evening | ORAL | Status: DC
  Administered 2019-11-13: 150 mg via ORAL
  Filled 2019-11-13: qty 1
  Filled 2019-11-13: qty 2

## 2019-11-13 MED ORDER — TRAZODONE HCL 50 MG PO TABS
225.0000 mg | ORAL_TABLET | Freq: Every day | ORAL | Status: DC
  Administered 2019-11-13: 225 mg via ORAL
  Filled 2019-11-13: qty 1

## 2019-11-13 MED ORDER — NITROGLYCERIN 0.4 MG SL SUBL: 0.4000 mg | SUBLINGUAL_TABLET | SUBLINGUAL | Status: DC | PRN

## 2019-11-13 MED ORDER — LORATADINE 10 MG PO TABS
5.0000 mg | ORAL_TABLET | Freq: Two times a day (BID) | ORAL | Status: DC
  Administered 2019-11-13 – 2019-11-14 (×2): 5 mg via ORAL
  Filled 2019-11-13 (×2): qty 1

## 2019-11-13 MED ORDER — ONDANSETRON HCL 4 MG/2ML IJ SOLN: 4.0000 mg | Freq: Four times a day (QID) | INTRAMUSCULAR | Status: DC | PRN

## 2019-11-13 MED ORDER — LAMOTRIGINE 25 MG PO TABS: 75.0000 mg | ORAL_TABLET | ORAL | Status: DC

## 2019-11-13 MED ORDER — SODIUM CHLORIDE 0.9 % IV SOLN: INTRAVENOUS | Status: DC

## 2019-11-13 NOTE — Consult Note (Addendum)
Cardiology Consultation:   Patient ID: Valerie [name] MRN: [id number]; DOB: Jul 09, 1961  Admit date: 11/13/2019 Date of Consult: 11/13/2019  Primary Care Provider: System, Pcp Not In Primary Cardiologist: New   Patient Profile:   Valerie [name] is a 59 y.o. female with a hx of diabetes, peripheral neuropathy, bipolar disorder, tobacco smoking and morbid obesity who is being seen today for the evaluation of chest discomfort at the request of Dr. Ralene Bathe.  No prior cardiac history.  Patient follows neurology for peripheral neuropathy.   She smokes 1 pack of cigarette every day.  Sedentary lifestyle due to history of frequent falls/peripheral neuropathy.  Lives with "female roommate".  Patient reports generalized weakness on all over her body.  Denies marijuana or cocaine abuse.  Social alcohol drinking.  Father had CABG in his early 64s and eventually died of heart failure in 76s.  History of Present Illness:   [name] presented for chest pain evaluation.  She had a sudden onset lower sternal/epigastric sharp pain while sitting in chair.  Radiated to her back and upper neck.  Her pain then become burning-like sensation with episode of vomiting.  EMS was called and noted hypertensive at 187/107.  She was given sublingual nitroglycerin x2 with improved pain from 9/10 to 4/10. She had headache.  Currently complains of chest and belly soreness.  She also has upper back soreness.  Denies prior similar episodes.  She has history of GERD but this is different.  Denies orthopnea or PND.  Patient reports involved in Covid antigen treatment in January.   BUN serum creatinine 26/1.54.  LFTs normal.  Hemoglobin normal.  High-sensitivity troponin 71.  Chest x-ray without acute finding.  Patient reports taking nadolol for tachycardia which is prescribed by PCP.  Past Medical History:  Diagnosis Date  . Anxiety   . Arthritis    Psoriatic  . Depression   . Sleep apnea    Inpatient Medications:  Scheduled Meds:  Continuous Infusions:  PRN Meds:   Allergies:    Allergies  Allergen Reactions  . Carbamazepine Other (See Comments)    Parkinsons like symptoms tremors  . Sertraline Hcl Other (See Comments)    Unknown reaction    Social History:   Social History   Socioeconomic History  . Marital status: Single    Spouse name: Not on file  . Number of children: Not on file  . Years of education: Not on file  . Highest education level: Not on file  Occupational History  . Not on file  Tobacco Use  . Smoking status: Current Every Day Smoker  . Smokeless tobacco: Never Used  Substance and Sexual Activity  . Alcohol use: Not on file  . Drug use: Not on file  . Sexual activity: Not on file  Other Topics Concern  . Not on file  Social History Narrative  . Not on file   Social Determinants of Health   Financial Resource Strain:   . Difficulty of Paying Living Expenses:   Food Insecurity:   . Worried About Charity fundraiser in the Last Year:   . Arboriculturist in the Last Year:   Transportation Needs:   . Film/video editor (Medical):   Marland Kitchen Lack of Transportation (Non-Medical):   Physical Activity:   . Days of Exercise per Week:   . Minutes of Exercise per Session:   Stress:   . Feeling of Stress :   Social Connections:   . Frequency of Communication with  Friends and Family:   . Frequency of Social Gatherings with Friends and Family:   . Attends Religious Services:   . Active Member of Clubs or Organizations:   . Attends Archivist Meetings:   Marland Kitchen Marital Status:   Intimate Partner Violence:   . Fear of Current or Ex-Partner:   . Emotionally Abused:   Marland Kitchen Physically Abused:   . Sexually Abused:     Family History:    Family History  Problem Relation Age of Onset  . Breast cancer Neg Hx      ROS:  Please see the history of present illness.  All other ROS reviewed and negative.     Physical Exam/Data:   Vitals:   11/13/19 1302  11/13/19 1321 11/13/19 1323  BP: 119/73    Pulse: (!) 58    Resp: 14    Temp: 98.6 F (37 C)    TempSrc: Oral    SpO2: 96%  95%  Weight:  106.6 kg   Height:  5\' 2"  (1.575 m)     Intake/Output Summary (Last 24 hours) at 11/13/2019 1610 Last data filed at 11/13/2019 1552 Gross per 24 hour  Intake -  Output 325 ml  Net -325 ml   Last 3 Weights 11/13/2019  Weight (lbs) 235 lb  Weight (kg) 106.595 kg     Body mass index is 42.98 kg/m.  General:  Obese female in no acute distress HEENT: normal Lymph: no adenopathy Neck: no JVD Endocrine:  No thryomegaly Vascular: No carotid bruits; FA pulses 2+ bilaterally without bruits, tender to palpitations at chest, upper back and abdomen  Cardiac:  normal S1, S2; RRR; no murmur  Lungs:  clear to auscultation bilaterally, no wheezing, rhonchi or rales  Abd: soft, tender, no hepatomegaly  Ext: no edema Musculoskeletal:  No deformities Skin: warm and dry  Neuro:  CNs 2-12 intact, no focal abnormalities noted Psych:  Normal affect   EKG:  The EKG was personally reviewed and demonstrates: Bradycardia at rate of 59 bpm Telemetry:  Telemetry was personally reviewed and demonstrates: Bradycardia in 50s  Relevant CV Studies: As above  Laboratory Data:  High Sensitivity Troponin:   Recent Labs  Lab 11/13/19 1330  TROPONINIHS 71*     Chemistry Recent Labs  Lab 11/13/19 1330  NA 142  K 4.5  CL 109  CO2 25  GLUCOSE 108*  BUN 26*  CREATININE 1.54*  CALCIUM 10.4*  GFRNONAA 37*  GFRAA 42*  ANIONGAP 8    Recent Labs  Lab 11/13/19 1330  PROT 5.6*  ALBUMIN 3.0*  AST 18  ALT 20  ALKPHOS 54  BILITOT 0.5   Hematology Recent Labs  Lab 11/13/19 1330  WBC 10.4  RBC 3.52*  HGB 12.3  HCT 38.7  MCV 109.9*  MCH 34.9*  MCHC 31.8  RDW 15.8*  PLT 164    Radiology/Studies:  DG Chest 2 View  Result Date: 11/13/2019 CLINICAL DATA:  Chest pain and back pain EXAM: CHEST - 2 VIEW COMPARISON:  None. FINDINGS: Upper normal heart  size. Low lung volumes are present, causing crowding of the pulmonary vasculature. The lungs appear clear; no edema identified. No blunting of the costophrenic angles. Minimal anterior wedging at T12 which could possibly be physiologic. Loss of disc height at T12-L1. IMPRESSION: 1. No acute findings. 2. Minimal anterior wedging at T12 which could possibly be physiologic. Electronically Signed   By: Van Clines M.D.   On: 11/13/2019 14:17    HEAR Score (  for undifferentiated chest pain):  HEAR Score: 4    Assessment and Plan:   1. Chest discomfort/elevated troponin Mixed symptoms.  Initial sharp burning pain radiating to back and shoulder which somewhat improved after sublingual nitroglycerin x2.  Currently complains of chest soreness.  Her pain is reproducible with palpation however reports initial presenting symptoms were different.  High-sensitivity troponin minimally elevated at 71.  EKG without acute ischemic changes.  Her cardiac risk factor includes ongoing tobacco smoking, morbid obesity, sedentary lifestyle, treated diabetes and family history of CAD.  Admit and cycle troponin.  Get echocardiogram.  N.p.o. after midnight. Will get coronary CT in AM.   2.  Tobacco smoking -Recommended cessation.  Education given.  Reports prior unsuccessful trial  on medications.   3.  Generalized body weakness -Recommended evaluation by primary team  4.  AKI -Gentle hydration -Unknown baseline renal function (Scr was 1.7-2.0 in 2016 per Care Everywhere) -Check renal function in morning  5.  Diabetes mellitus with peripheral neuropathy -Per primary team  6.  History of tachycardia -On nadolol, currently bradycardic in 50s  7.  Frequent urination -Pending UA  For questions or updates, please contact [place] Please consult [email or web address] for contact info under     Jarrett Soho, PA  11/13/2019 4:10 PM   Patient seen and examined with Leanor Kail, PA .  Agree  as above, with the following exceptions and changes as noted below. Chest pain has some atypical features but is nitro responsive, has risk factors of DM2, smoking.  Gen: NAD, CV: RRR, no murmurs, Lungs: clear, Abd: soft, Extrem: Warm, well perfused, no edema, Neuro/Psych: alert and oriented x 3, normal mood and affect. Skin: erythema over the face and neck, blanching.  All available labs, radiology testing, previous records reviewed.   She is intermediate risk for coronary artery disease, and needs an ischemic evaluation. She would be well suited for coronary CTA to evaluate for multivessel disease in setting of DM, and chest pain. Will perform tomorrow. Trend troponins overnight.   Remainder per IM.   [name], MD 11/13/19 5:05 PM

## 2019-11-13 NOTE — Progress Notes (Addendum)
Critical value- Troponin 218  Pt is asymptomatic- [facility] on-call Will continue to monitor

## 2019-11-13 NOTE — ED Notes (Signed)
Pt alert and oriented, eating and drinking a coke at this time.  Will move to holding zone soon.

## 2019-11-13 NOTE — ED Notes (Signed)
Pt ambulatory to bathroom with assistance.

## 2019-11-13 NOTE — ED Notes (Signed)
Denies any pain at this time.  Sidelying without distress noted.

## 2019-11-13 NOTE — ED Provider Notes (Signed)
[facility] EMERGENCY DEPARTMENT Provider Note   CSN: [id number] Arrival date & time: 11/13/19  1236     History No chief complaint on file.   Valerie [name] is a 59 y.o. female.  The history is provided by the patient and medical records. No language interpreter was used.   Valerie [name] is a 59 y.o. female who presents to the Emergency Department complaining of chest pain. She presents the emergency department by EMS accompanied by her daughter for evaluation of chest pain. Last night she had some thoracic back pain that she attributed to her chronic pain. Today she developed central, sharp chest pain radiating to her back as well as her abdomen. She had associated nausea and one episode of emesis. Her pain was very severe when it occurred, now it is almost resolved. She denies any fevers, diarrhea, dysuria. She has been having problems with her neuropathy recently with increased difficulty walking secondary to the neuropathy. She did have a lumbar epidural injection performed last week. She has a history of psoriatic arthritis is on chronic steroids. No prior similar symptoms.    Past Medical History:  Diagnosis Date  . Anxiety   . Arthritis    Psoriatic  . Depression   . Sleep apnea     There are no problems to display for this patient.   No past surgical history on file.   OB History   No obstetric history on file.     Family History  Problem Relation Age of Onset  . Breast cancer Neg Hx     Social History   Tobacco Use  . Smoking status: Current Every Day Smoker  . Smokeless tobacco: Never Used  Substance Use Topics  . Alcohol use: Not on file  . Drug use: Not on file    Home Medications Prior to Admission medications   Medication Sig Start Date End Date Taking? Authorizing Provider  ALPRAZolam Duanne Moron) 0.5 MG tablet Take 0.5 mg by mouth as needed.  04/18/13   [provider]  Cholecalciferol (VITAMIN D-3) 1000 units CAPS Take  by mouth.    [provider]  cycloSPORINE modified (NEORAL) 100 MG capsule Take 100 mg by mouth daily.  09/12/15   [provider]  escitalopram (LEXAPRO) 20 MG tablet Take 20 mg by mouth daily.  09/10/15   [provider]  hydroxychloroquine (PLAQUENIL) 200 MG tablet Take 200 mg by mouth daily.     [provider]  lamoTRIgine (LAMICTAL) 100 MG tablet TAKE 1 T BY MOUTH TWICE DAILY 11/08/15   [provider]  lamoTRIgine (LAMICTAL) 150 MG tablet TK 1 T PO QAM AND 1/2 T QPM 02/27/18   [provider]  metFORMIN (GLUCOPHAGE) 500 MG tablet TK 1 T PO QAM [facility] 12/24/17   [provider]  methocarbamol (ROBAXIN) 500 MG tablet Take 500 mg by mouth daily.  05/02/15   [provider]  Omeprazole (PRILOSEC PO) omeprazole    [provider]  omeprazole (PRILOSEC) 20 MG capsule Take 20 mg by mouth daily.     [provider]  ondansetron (ZOFRAN) 4 MG tablet Take 1 tablet (4 mg total) by mouth every 8 (eight) hours as needed for nausea or vomiting. 03/16/18   Hyatt, Max T, DPM  predniSONE (DELTASONE) 10 MG tablet Take 10 mg by mouth daily.  02/11/13   [provider]  traMADol (ULTRAM) 50 MG tablet Take 50 mg by mouth as needed.     [provider]    Allergies    Carbamazepine, Pregabalin, and Sertraline hcl  Review of Systems   Review of Systems  All other systems reviewed and are negative.   Physical Exam Updated Vital Signs BP 119/73 (BP Location: Right Arm)   Pulse (!) 58   Temp 98.6 F (37 C) (Oral)   Resp 14   Ht 5\' 2"  (1.575 m)   Wt 106.6 kg   SpO2 95%   BMI 42.98 kg/m   Physical Exam Vitals and nursing note reviewed.  Constitutional:      Appearance: She is well-developed.  HENT:     Head: Normocephalic and atraumatic.  Cardiovascular:     Rate and Rhythm: Normal rate and regular rhythm.     Heart sounds: No murmur.  Pulmonary:     Effort: Pulmonary effort is normal. No  respiratory distress.     Breath sounds: Normal breath sounds.  Abdominal:     Palpations: Abdomen is soft.     Tenderness: There is no abdominal tenderness. There is no guarding or rebound.  Musculoskeletal:        General: No tenderness.     Comments: 2+ DP pulses bilaterally  Skin:    General: Skin is warm and dry.  Neurological:     Mental Status: She is alert and oriented to person, place, and time.  Psychiatric:        Behavior: Behavior normal.     ED Results / Procedures / Treatments   Labs (all labs ordered are listed, but only abnormal results are displayed) Labs Reviewed  COMPREHENSIVE METABOLIC PANEL - Abnormal; Notable for the following components:      Result Value   Glucose, Bld 108 (*)    BUN 26 (*)    Creatinine, Ser 1.54 (*)    Calcium 10.4 (*)    Total Protein 5.6 (*)    Albumin 3.0 (*)    GFR calc non Af Amer 37 (*)    GFR calc Af Amer 42 (*)    All other components within normal limits  CBC WITH DIFFERENTIAL/PLATELET - Abnormal; Notable for the following components:   RBC 3.52 (*)    MCV 109.9 (*)    MCH 34.9 (*)    RDW 15.8 (*)    Neutro Abs 8.3 (*)    Abs Immature Granulocytes 0.20 (*)    All other components within normal limits  TROPONIN I (HIGH SENSITIVITY) - Abnormal; Notable for the following components:   Troponin I (High Sensitivity) 71 (*)    All other components within normal limits  SARS CORONAVIRUS 2 (TAT 6-24 HRS)  LIPASE, BLOOD  URINALYSIS, ROUTINE W REFLEX MICROSCOPIC  TROPONIN I (HIGH SENSITIVITY)    EKG EKG Interpretation  Date/Time:  Monday November 13 2019 12:58:36 EDT Ventricular Rate:  59 PR Interval:    QRS Duration: 83 QT Interval:  394 QTC Calculation: 391 R Axis:   1 Text Interpretation: Sinus rhythm Borderline prolonged PR interval Low voltage, precordial leads no prior available for comparison Confirmed by Quintella Reichert [phone]) on 11/13/2019 1:49:57 PM   Radiology DG Chest 2 View  Result Date:  11/13/2019 CLINICAL DATA:  Chest pain and back pain EXAM: CHEST - 2 VIEW COMPARISON:  None. FINDINGS: Upper normal heart size. Low lung volumes are present, causing crowding of the pulmonary vasculature. The lungs appear clear; no edema identified. No blunting of the costophrenic angles. Minimal anterior wedging at T12 which could possibly be physiologic. Loss of disc height  at T12-L1. IMPRESSION: 1. No acute findings. 2. Minimal anterior wedging at T12 which could possibly be physiologic. Electronically Signed   By: Van Clines M.D.   On: 11/13/2019 14:17    Procedures Procedures (including critical care time)  Medications Ordered in ED Medications - No data to display  ED Course  I have reviewed the triage vital signs and the nursing notes.  Pertinent labs & imaging results that were available during my care of the patient were reviewed by me and considered in my medical decision making (see chart for details).    MDM Rules/Calculators/A&P                     Patient here for evaluation of chest pain, resolved on ED evaluation. EKG without acute ischemic changes. Troponin is elevated at 71, no priors available for comparison. Labs demonstrate baseline renal insufficiency. Cardiology consulted regarding chest pain. Patient and daughter updated findings of studies recommendation for admission and they are in agreement with treatment plan.  Final Clinical Impression(s) / ED Diagnoses Final diagnoses:  Precordial chest pain    Rx / DC Orders ED Discharge Orders    None       Quintella Reichert, MD 11/13/19 1541

## 2019-11-13 NOTE — ED Triage Notes (Signed)
CP starting one hour ago, sitting in her chair without exertion.  Pain started from chest into back with HA.  Poor intake over the last several days d/t increased urination and no AM meds today. ASA 324mg , 2 Nitro rekueved her HA and CP.

## 2019-11-13 NOTE — ED Notes (Signed)
Dinner ordered 

## 2019-11-13 NOTE — H&P (Signed)
History and Physical    Valerie [name] [id number] DOB: 1960/10/09 DOA: 11/13/2019  PCP: System, Pcp Not In  Patient coming from: Home I have personally briefly reviewed patient's old medical records in [place]  Chief Complaint: Chest pain started this morning  HPI: Valerie [name] is a 59 y.o. female with medical history significant of bipolar disorder, depression/anxiety, psoriatic arthritis, obstructive sleep apnea on CPAP at home, neuropathy, CKD stage IIIb, morbid obesity, tobacco abuse presents to emergency department with chest pain which started suddenly this morning.  Patient tells me that she was sitting this morning and suddenly she developed chest pain, central, 10 out of 10, radiates to her back, burning in sensation, associated with nausea and vomiting.  Denies association with shortness of breath, palpitation, leg swelling, headache, blurry vision, cough, congestion, fever, chills, abdominal pain, urinary or bowel changes.   She said her A1c is well controlled and she does not take any medicines for diabetes at home.  She lives with female roommate.  Smokes 1 pack of cigarettes per day, drinks alcohol occasionally and denies illicit drug use.  ED Course: Upon arrival to ED: Patient's heart rate 58, other vital signs stable.  On room air.  She is afebrile with no leukocytosis.  Chest x-ray negative.  Lipase: WNL.  Troponin: 71, EKG: No ST-T wave changes.  EDP consulted cardiology who recommended coronary CTA tomorrow a.m.  [facility] consulted for observation overnight for chest pain.  Review of Systems: As per HPI otherwise negative.    Past Medical History:  Diagnosis Date  . Anxiety   . Arthritis    Psoriatic  . Depression   . Sleep apnea     No past surgical history on file.   reports that she has been smoking. She has never used smokeless tobacco. No history on file for alcohol and drug.  Allergies  Allergen Reactions  . Carbamazepine Other (See  Comments)    Parkinsons like symptoms tremors  . Sertraline Hcl Other (See Comments)    Unknown reaction    Family History  Problem Relation Age of Onset  . Breast cancer Neg Hx     Prior to Admission medications   Medication Sig Start Date End Date Taking? Authorizing Provider  ALPRAZolam Duanne Moron) 0.5 MG tablet Take 0.5 mg by mouth 3 (three) times daily as needed for anxiety.  04/18/13  Yes [provider]  Ascorbic Acid (VITAMIN C) 1000 MG tablet Take 1,000 mg by mouth at bedtime.   Yes [provider]  b complex vitamins tablet Take 2 tablets by mouth at bedtime.   Yes [provider]  Cholecalciferol (VITAMIN D-3) 1000 units CAPS Take 1,000 Units by mouth 2 (two) times daily.    Yes [provider]  docusate sodium (COLACE) 100 MG capsule Take 100-200 mg by mouth at bedtime.   Yes [provider]  donepezil (ARICEPT) 10 MG tablet Take 10 mg by mouth daily. 10/30/19  Yes [provider]  escitalopram (LEXAPRO) 20 MG tablet Take 30 mg by mouth daily.  09/10/15  Yes [provider]  etanercept (ENBREL) 50 MG/ML injection Inject 50 mg into the skin every Saturday.   Yes [provider]  fluconazole (DIFLUCAN) 100 MG tablet Take 100 mg by mouth daily. 11/10/19  Yes [provider]  HYDROcodone-acetaminophen (NORCO) 10-325 MG tablet Take 1 tablet by mouth 2 (two) times daily as needed (pain).  07/31/19  Yes [provider]  hydroxychloroquine (PLAQUENIL) 200 MG tablet  Take 200 mg by mouth 2 (two) times daily.    Yes [provider]  lamoTRIgine (LAMICTAL) 150 MG tablet Take 75-150 mg by mouth See admin instructions. Take 1/2 tablet (75 mg) by mouth every morning and 1 tablet (150 mg) at night 02/27/18  Yes [provider]  levocetirizine (XYZAL) 5 MG tablet Take 5 mg by mouth 2 (two) times daily.  10/26/19  Yes [provider]  methotrexate (RHEUMATREX) 2.5 MG tablet Take 10 mg by mouth  every Monday. 10/30/19  Yes [provider]  nadolol (CORGARD) 40 MG tablet Take 20 mg by mouth daily.   Yes [provider]  Olopatadine HCl (PATADAY OP) Place 1 drop into both eyes daily as needed (itching).   Yes [provider]  omeprazole (PRILOSEC) 20 MG capsule Take 20 mg by mouth daily.    Yes [provider]  predniSONE (DELTASONE) 10 MG tablet Take 10 mg by mouth daily.  02/11/13  Yes [provider]  Probiotic CAPS Take 1 capsule by mouth at bedtime.   Yes [provider]  rizatriptan (MAXALT-MLT) 10 MG disintegrating tablet Take 10 mg by mouth See admin instructions. Take one tablet (10 mg) by mouth daily as needed for "floaters", May repeat in 2 hours if still needed   Yes [provider]  traZODone (DESYREL) 150 MG tablet Take 225 mg by mouth at bedtime. 10/27/19  Yes [provider]  ondansetron (ZOFRAN) 4 MG tablet Take 1 tablet (4 mg total) by mouth every 8 (eight) hours as needed for nausea or vomiting. Patient not taking: Reported on 11/13/2019 03/16/18   Garrel Ridgel, [place]    Physical Exam: Vitals:   11/13/19 1302 11/13/19 1321 11/13/19 1323  BP: 119/73    Pulse: (!) 58    Resp: 14    Temp: 98.6 F (37 C)    TempSrc: Oral    SpO2: 96%  95%  Weight:  106.6 kg   Height:  5\' 2"  (1.575 m)     Constitutional: NAD, calm, comfortable, morbidly obese, communicating well, on room air Eyes: PERRL, lids and conjunctivae normal ENMT: Mucous membranes are moist. Posterior pharynx clear of any exudate or lesions.Normal dentition.  Neck: normal, supple, no masses, no thyromegaly Respiratory: clear to auscultation bilaterally, no wheezing, no crackles. Normal respiratory effort. No accessory muscle use.  Cardiovascular: Regular rate and rhythm, no murmurs / rubs / gallops. No extremity edema. 2+ pedal pulses. No carotid bruits.  Abdomen: no tenderness, no masses palpated. No hepatosplenomegaly. Bowel sounds positive.    Musculoskeletal: no clubbing / cyanosis. No joint deformity upper and lower extremities. Good ROM, no contractures. Normal muscle tone.  Skin: Multiple bruises noted on hands and arms.   Neurologic: CN 2-12 grossly intact. Sensation intact, DTR normal. Strength 5/5 in all 4.  Psychiatric: Normal judgment and insight. Alert and oriented x 3. Normal mood.    Labs on Admission: I have personally reviewed following labs and imaging studies  CBC: Recent Labs  Lab 11/13/19 1330  WBC 10.4  NEUTROABS 8.3*  HGB 12.3  HCT 38.7  MCV 109.9*  PLT 123456   Basic Metabolic Panel: Recent Labs  Lab 11/13/19 1330  NA 142  K 4.5  CL 109  CO2 25  GLUCOSE 108*  BUN 26*  CREATININE 1.54*  CALCIUM 10.4*   GFR: Estimated Creatinine Clearance: 45.1 mL/min (A) (by C-G formula based on SCr of 1.54 mg/dL (H)). Liver Function Tests: Recent Labs  Lab 11/13/19  1330  AST 18  ALT 20  ALKPHOS 54  BILITOT 0.5  PROT 5.6*  ALBUMIN 3.0*   Recent Labs  Lab 11/13/19 1330  LIPASE 26   No results for input(s): AMMONIA in the last 168 hours. Coagulation Profile: No results for input(s): INR, PROTIME in the last 168 hours. Cardiac Enzymes: No results for input(s): CKTOTAL, CKMB, CKMBINDEX, TROPONINI in the last 168 hours. BNP (last 3 results) No results for input(s): PROBNP in the last 8760 hours. HbA1C: No results for input(s): HGBA1C in the last 72 hours. CBG: No results for input(s): GLUCAP in the last 168 hours. Lipid Profile: No results for input(s): CHOL, HDL, LDLCALC, TRIG, CHOLHDL, LDLDIRECT in the last 72 hours. Thyroid Function Tests: No results for input(s): TSH, T4TOTAL, FREET4, T3FREE, THYROIDAB in the last 72 hours. Anemia Panel: No results for input(s): VITAMINB12, FOLATE, FERRITIN, TIBC, IRON, RETICCTPCT in the last 72 hours. Urine analysis: No results found for: COLORURINE, APPEARANCEUR, LABSPEC, [place], GLUCOSEU, HGBUR, BILIRUBINUR, KETONESUR, PROTEINUR, UROBILINOGEN,  NITRITE, LEUKOCYTESUR  Radiological Exams on Admission: DG Chest 2 View  Result Date: 11/13/2019 CLINICAL DATA:  Chest pain and back pain EXAM: CHEST - 2 VIEW COMPARISON:  None. FINDINGS: Upper normal heart size. Low lung volumes are present, causing crowding of the pulmonary vasculature. The lungs appear clear; no edema identified. No blunting of the costophrenic angles. Minimal anterior wedging at T12 which could possibly be physiologic. Loss of disc height at T12-L1. IMPRESSION: 1. No acute findings. 2. Minimal anterior wedging at T12 which could possibly be physiologic. Electronically Signed   By: Van Clines M.D.   On: 11/13/2019 14:17    EKG: Independently reviewed.  Sinus rhythm.  Borderline prolonged PR interval.  No ST elevation or depression noted.  Assessment/Plan Principal Problem:   Chest pain Active Problems:   Depression   Anxiety   Obesity, Class III, BMI 40-49.9 (morbid obesity) (HCC)   CKD (chronic kidney disease), stage III   Psoriatic arthritis (HCC)   Tobacco abuse   OSA (obstructive sleep apnea)   Neuropathy   Bipolar disorder (HCC)   Chest pain: -Patient has multiple risk factors such as morbid obesity, tobacco abuse, hypertension, sedentary lifestyle -Initial troponin elevated at 71.  Chest x-ray negative, EKG: No ST T wave changes. -Admit patient on the floor for observation.  Trend troponin. -Nitro and morphine as needed for pain control.  On continuous pulse ox. -Appreciate cardiology help. -N.p.o. after midnight for coronary CTA tomorrow AM.  CKD stage IIIb: Stable -Continue to monitor  Hypertension: Stable continue nadolol  Depression/anxiety/bipolar disorder: Continue home meds -Xanax, Aricept, Lexapro, Lamictal, trazodone  Psoriatic arthritis: -Continue methotrexate, prednisone, Plaquenil  GERD: Continue PPI  Obstructive sleep apnea on CPAP: Patient's family will bring CPAP tonight  Tobacco abuse: Counseled about cessation.  DVT  prophylaxis: Lovenox/SCD/TED  code Status: Full code Family Communication: Patient's daughter present at bedside.  Plan of care discussed with patient and her daughter in length and they verbalized understanding and agreed with it. Disposition Plan: Likely home tomorrow Consults called: Cardiology by EDP Admission status: Observation   Mckinley Jewel MD [facility] Pager [phone]  If 7PM-7AM, please contact night-coverage [email or web address] Password [facility]  11/13/2019, 5:39 PM

## 2019-11-14 ENCOUNTER — Encounter (HOSPITAL_COMMUNITY): Payer: Self-pay | Admitting: Internal Medicine

## 2019-11-14 ENCOUNTER — Encounter (HOSPITAL_COMMUNITY)
Admission: EM | Disposition: A | Discharge status: Home / Self Care | Payer: Self-pay | Source: Home / Self Care | Attending: Emergency Medicine

## 2019-11-14 ENCOUNTER — Observation Stay (HOSPITAL_BASED_OUTPATIENT_CLINIC_OR_DEPARTMENT_OTHER): Payer: Medicare Other

## 2019-11-14 DIAGNOSIS — I214 Non-ST elevation (NSTEMI) myocardial infarction: Secondary | ICD-10-CM | POA: Diagnosis not present

## 2019-11-14 DIAGNOSIS — I2510 Atherosclerotic heart disease of native coronary artery without angina pectoris: Secondary | ICD-10-CM

## 2019-11-14 DIAGNOSIS — R079 Chest pain, unspecified: Secondary | ICD-10-CM

## 2019-11-14 DIAGNOSIS — N1832 Chronic kidney disease, stage 3b: Secondary | ICD-10-CM

## 2019-11-14 DIAGNOSIS — E119 Type 2 diabetes mellitus without complications: Secondary | ICD-10-CM

## 2019-11-14 DIAGNOSIS — E1140 Type 2 diabetes mellitus with diabetic neuropathy, unspecified: Secondary | ICD-10-CM

## 2019-11-14 HISTORY — PX: LEFT HEART CATH AND CORONARY ANGIOGRAPHY: CATH118249

## 2019-11-14 HISTORY — DX: Non-ST elevation (NSTEMI) myocardial infarction: I21.4

## 2019-11-14 HISTORY — DX: Type 2 diabetes mellitus without complications: E11.9

## 2019-11-14 LAB — CBC
HCT: 37.2 % (ref 36.0–46.0)
Hemoglobin: 11.6 g/dL — ABNORMAL LOW (ref 12.0–15.0)
MCH: 33.8 pg (ref 26.0–34.0)
MCHC: 31.2 g/dL (ref 30.0–36.0)
MCV: 108.5 fL — ABNORMAL HIGH (ref 80.0–100.0)
Platelets: 184 10*3/uL (ref 150–400)
RBC: 3.43 MIL/uL — ABNORMAL LOW (ref 3.87–5.11)
RDW: 15.7 % — ABNORMAL HIGH (ref 11.5–15.5)
WBC: 13.4 10*3/uL — ABNORMAL HIGH (ref 4.0–10.5)
nRBC: 0 % (ref 0.0–0.2)

## 2019-11-14 LAB — GLUCOSE, CAPILLARY: Glucose-Capillary: 157 mg/dL — ABNORMAL HIGH (ref 70–99)

## 2019-11-14 LAB — BASIC METABOLIC PANEL
Anion gap: 5 (ref 5–15)
BUN: 26 mg/dL — ABNORMAL HIGH (ref 6–20)
CO2: 30 mmol/L (ref 22–32)
Calcium: 10.2 mg/dL (ref 8.9–10.3)
Chloride: 108 mmol/L (ref 98–111)
Creatinine, Ser: 1.62 mg/dL — ABNORMAL HIGH (ref 0.44–1.00)
GFR calc Af Amer: 40 mL/min — ABNORMAL LOW (ref >60)
GFR calc non Af Amer: 34 mL/min — ABNORMAL LOW (ref >60)
Glucose, Bld: 127 mg/dL — ABNORMAL HIGH (ref 70–99)
Potassium: 5.1 mmol/L (ref 3.5–5.1)
Sodium: 143 mmol/L (ref 135–145)

## 2019-11-14 LAB — TROPONIN I (HIGH SENSITIVITY)
Troponin I (High Sensitivity): 143 ng/L (ref <18)
Troponin I (High Sensitivity): 116 ng/L (ref <18)

## 2019-11-14 LAB — ECHOCARDIOGRAM COMPLETE
Height: 62 in
Weight: 3595.2 oz

## 2019-11-14 LAB — HIV ANTIBODY (ROUTINE TESTING W REFLEX): HIV Screen 4th Generation wRfx: NONREACTIVE

## 2019-11-14 LAB — HEMOGLOBIN A1C
Hgb A1c MFr Bld: 5.8 % — ABNORMAL HIGH (ref 4.8–5.6)
Mean Plasma Glucose: 119.76 mg/dL

## 2019-11-14 LAB — LIPID PANEL
Cholesterol: 210 mg/dL — ABNORMAL HIGH (ref 0–200)
HDL: 37 mg/dL — ABNORMAL LOW (ref >40)
LDL Cholesterol: 133 mg/dL — ABNORMAL HIGH (ref 0–99)
Total CHOL/HDL Ratio: 5.7 RATIO
Triglycerides: 202 mg/dL — ABNORMAL HIGH (ref <150)
VLDL: 40 mg/dL (ref 0–40)

## 2019-11-14 SURGERY — LEFT HEART CATH AND CORONARY ANGIOGRAPHY
Anesthesia: LOCAL

## 2019-11-14 MED ORDER — VERAPAMIL HCL 2.5 MG/ML IV SOLN
INTRAVENOUS | Status: DC | PRN
  Administered 2019-11-14: 10 mL via INTRA_ARTERIAL

## 2019-11-14 MED ORDER — ROSUVASTATIN CALCIUM 5 MG PO TABS: 5.0000 mg | ORAL_TABLET | Freq: Every day | ORAL | 0 refills | Status: DC

## 2019-11-14 MED ORDER — SODIUM CHLORIDE 0.9% FLUSH: 3.0000 mL | INTRAVENOUS | Status: DC | PRN

## 2019-11-14 MED ORDER — INSULIN ASPART 100 UNIT/ML ~~LOC~~ SOLN: 0.0000 [IU] | Freq: Three times a day (TID) | SUBCUTANEOUS | Status: DC

## 2019-11-14 MED ORDER — FENTANYL CITRATE (PF) 100 MCG/2ML IJ SOLN
INTRAMUSCULAR | Status: AC
  Filled 2019-11-14: qty 2

## 2019-11-14 MED ORDER — SODIUM CHLORIDE 0.9% FLUSH: 3.0000 mL | Freq: Two times a day (BID) | INTRAVENOUS | Status: DC

## 2019-11-14 MED ORDER — HYDRALAZINE HCL 20 MG/ML IJ SOLN: 10.0000 mg | INTRAMUSCULAR | Status: AC | PRN

## 2019-11-14 MED ORDER — HEPARIN SODIUM (PORCINE) 1000 UNIT/ML IJ SOLN
INTRAMUSCULAR | Status: AC
  Filled 2019-11-14: qty 1

## 2019-11-14 MED ORDER — MIDAZOLAM HCL 2 MG/2ML IJ SOLN
INTRAMUSCULAR | Status: AC
  Filled 2019-11-14: qty 2

## 2019-11-14 MED ORDER — SODIUM CHLORIDE 0.9 % IV SOLN: 250.0000 mL | INTRAVENOUS | Status: DC | PRN

## 2019-11-14 MED ORDER — IOHEXOL 350 MG/ML SOLN
INTRAVENOUS | Status: AC
  Filled 2019-11-14: qty 1

## 2019-11-14 MED ORDER — SODIUM CHLORIDE 0.9 % IV SOLN: INTRAVENOUS | Status: DC

## 2019-11-14 MED ORDER — HEPARIN SODIUM (PORCINE) 1000 UNIT/ML IJ SOLN
INTRAMUSCULAR | Status: DC | PRN
  Administered 2019-11-14: 5000 [IU] via INTRAVENOUS

## 2019-11-14 MED ORDER — HEPARIN (PORCINE) IN NACL 1000-0.9 UT/500ML-% IV SOLN
INTRAVENOUS | Status: AC
  Filled 2019-11-14: qty 1000

## 2019-11-14 MED ORDER — ENOXAPARIN SODIUM 40 MG/0.4ML ~~LOC~~ SOLN: 40.0000 mg | SUBCUTANEOUS | Status: DC

## 2019-11-14 MED ORDER — LIDOCAINE HCL (PF) 1 % IJ SOLN
INTRAMUSCULAR | Status: AC
  Filled 2019-11-14: qty 30

## 2019-11-14 MED ORDER — FENTANYL CITRATE (PF) 100 MCG/2ML IJ SOLN
INTRAMUSCULAR | Status: DC | PRN
  Administered 2019-11-14: 25 ug via INTRAVENOUS

## 2019-11-14 MED ORDER — IOHEXOL 350 MG/ML SOLN
INTRAVENOUS | Status: DC | PRN
  Administered 2019-11-14: 40 mL

## 2019-11-14 MED ORDER — ASPIRIN 81 MG PO CHEW
81.0000 mg | CHEWABLE_TABLET | ORAL | Status: AC
  Administered 2019-11-14: 81 mg via ORAL
  Filled 2019-11-14: qty 1

## 2019-11-14 MED ORDER — SUMATRIPTAN SUCCINATE 50 MG PO TABS
50.0000 mg | ORAL_TABLET | ORAL | Status: DC | PRN
  Filled 2019-11-14: qty 1

## 2019-11-14 MED ORDER — LIDOCAINE HCL (PF) 1 % IJ SOLN
INTRAMUSCULAR | Status: DC | PRN
  Administered 2019-11-14: 2 mL

## 2019-11-14 MED ORDER — LABETALOL HCL 5 MG/ML IV SOLN: 10.0000 mg | INTRAVENOUS | Status: AC | PRN

## 2019-11-14 MED ORDER — VERAPAMIL HCL 2.5 MG/ML IV SOLN
INTRAVENOUS | Status: AC
  Filled 2019-11-14: qty 2

## 2019-11-14 MED ORDER — ASPIRIN EC 81 MG PO TBEC: 81.0000 mg | DELAYED_RELEASE_TABLET | Freq: Every day | ORAL | Status: AC

## 2019-11-14 MED ORDER — NITROGLYCERIN 0.4 MG SL SUBL: 0.4000 mg | SUBLINGUAL_TABLET | SUBLINGUAL | 0 refills | Status: DC | PRN

## 2019-11-14 MED ORDER — HEPARIN (PORCINE) IN NACL 1000-0.9 UT/500ML-% IV SOLN
INTRAVENOUS | Status: DC | PRN
  Administered 2019-11-14 (×2): 500 mL

## 2019-11-14 MED ORDER — RIZATRIPTAN BENZOATE 10 MG PO TBDP: 10.0000 mg | ORAL_TABLET | Freq: Once | ORAL | Status: DC | PRN

## 2019-11-14 MED ORDER — MIDAZOLAM HCL 2 MG/2ML IJ SOLN
INTRAMUSCULAR | Status: DC | PRN
  Administered 2019-11-14: 1 mg via INTRAVENOUS

## 2019-11-14 MED ORDER — SODIUM CHLORIDE 0.9 % WEIGHT BASED INFUSION
1.0000 mL/kg/h | INTRAVENOUS | Status: AC
  Administered 2019-11-14: 1 mL/kg/h via INTRAVENOUS

## 2019-11-14 SURGICAL SUPPLY — 12 items
CATH 5FR JL3.5 JR4 ANG PIG MP (CATHETERS) ×1 IMPLANT
CATH LAUNCHER 5F RADR (CATHETERS) IMPLANT
CATHETER LAUNCHER 5F RADR (CATHETERS) ×2
DEVICE RAD COMP TR BAND LRG (VASCULAR PRODUCTS) ×1 IMPLANT
GLIDESHEATH SLEND SS 6F .021 (SHEATH) ×1 IMPLANT
GUIDEWIRE INQWIRE 1.5J.035X260 (WIRE) IMPLANT
INQWIRE 1.5J .035X260CM (WIRE) ×2
KIT HEART LEFT (KITS) ×2 IMPLANT
PACK CARDIAC CATHETERIZATION (CUSTOM PROCEDURE TRAY) ×2 IMPLANT
SHEATH PROBE COVER 6X72 (BAG) ×1 IMPLANT
TRANSDUCER W/STOPCOCK (MISCELLANEOUS) ×2 IMPLANT
TUBING CIL FLEX 10 FLL-RA (TUBING) ×2 IMPLANT

## 2019-11-14 NOTE — Discharge Summary (Signed)
Physician Discharge Summary  Valerie [name] [id number] DOB: 07-Feb-1961 DOA: 11/13/2019  PCP: System, Pcp Not In  Admit date: 11/13/2019 Discharge date: 11/14/2019  Recommendations for Outpatient Follow-up:  1. NSTEMI  Follow-up Information    [name], MD. Schedule an appointment as soon as possible for a visit in 2 week(s).   Specialties: Cardiology, Radiology Why: Make appointment for follow-up 1 to 2 weeks Contact information: [address] [place] [address] [phone]            Discharge Diagnoses: Principal diagnosis is #1 1. NSTEMI 2. CKD stage IIIb 3. Essential hypertension 4. Diabetes mellitus type 2 diet controlled with peripheral neuropathy,  5. Psoriatic arthritis on chronic steroids 6. Cigarette smoker 1 pack/day  Discharge Condition: improved Disposition: home  Diet recommendation: heart healthy  Filed Weights   11/13/19 1321 11/13/19 2136 11/14/19 0533  Weight: 106.6 kg 101.9 kg 101.9 kg    History of present illness:  59 year old woman PMH including diabetes mellitus with peripheral neuropathy, morbid obesity presented with chest pain and found to have elevated troponin. She was admitted for further evaluation of chest pain elevated troponin, plans for echocardiogram, seen by cardiology with plans for coronary CT.  Hospital Course:  Patient was admitted for further evaluation, troponins went higher and cardiology elected to proceed with heart catheterization which showed nonobstructive disease.  Patient asymptomatic and case discussed with Dr. [name] with recommendations as below.  Clear for discharge.  NSTEMI. Risk factors include tobacco smoking, and oriented obesity, sedentary lifestyle, diabetes mellitus and family history of CAD.   EKG without acute ischemic changes.   --Troponin 71 > 218 --Given elevated troponin, left heart catheterization was recommended rather than coronary CTA. 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  Potential etiology for chest pain includes hypertensive heart disease, stress-induced cardiomyopathy or aortic pathology. --Echocardiogram LVEF 60-65% with normal LV function and no regional wall motion abnormalities.  Diastolic parameters within normal limits.  Right systolic function normal. --Discharge home on aspirin 81 mg daily, nadolol 20 mg daily and Crestor 5 mg daily with outpatient follow-up with her in 1 to 2 weeks.  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 [name], in her opinion no contraindication to use of Maxalt given no evidence of ischemic disease.  This was prescribed by patient's outpatient physician   Consults:  . Cardiology  Today's assessment: See progress note same day    Discharge Instructions  Discharge Instructions    Diet - low sodium heart healthy   Complete by: As directed    Discharge instructions   Complete by: As directed    Call your physician or seek immediate medical attention for chest pain, shortness of breath or worsening of condition.   Increase activity slowly   Complete by: As directed      Allergies as of 11/14/2019      Reactions   Carbamazepine Other (See Comments)   Parkinsons like symptoms tremors   Sertraline Hcl Other (See Comments)   Unknown reaction      Medication List  STOP taking these medications   ondansetron 4 MG tablet Commonly known as: Zofran       TAKE these medications   ALPRAZolam 0.5 MG tablet Commonly known as: XANAX Take 0.5 mg by mouth 3 (three) times daily as needed for anxiety.   aspirin EC 81 MG tablet Take 1 tablet (81 mg total) by mouth daily.   b complex vitamins tablet Take 2 tablets by mouth at bedtime.   docusate sodium 100 MG capsule Commonly known as: COLACE Take 100-200 mg by mouth at bedtime.   donepezil 10 MG tablet Commonly known as: ARICEPT Take 10 mg by mouth daily.   Enbrel 50 MG/ML injection Generic drug: etanercept Inject 50 mg into the skin every Saturday.   escitalopram 20 MG tablet Commonly known as: LEXAPRO Take 30 mg by mouth daily.   fluconazole 100 MG tablet Commonly known as: DIFLUCAN Take 100 mg by mouth daily.   HYDROcodone-acetaminophen 10-325 MG tablet Commonly known as: NORCO Take 1 tablet by mouth 2 (two) times daily as needed (pain).   hydroxychloroquine 200 MG tablet Commonly known as: PLAQUENIL Take 200 mg by mouth 2 (two) times daily.   lamoTRIgine 150 MG tablet Commonly known as: LAMICTAL Take 75-150 mg by mouth See admin instructions. Take 1/2 tablet (75 mg) by mouth every morning and 1 tablet (150 mg) at night   levocetirizine 5 MG tablet Commonly known as: XYZAL Take 5 mg by mouth 2 (two) times daily.   methotrexate 2.5 MG tablet Commonly known as: RHEUMATREX Take 10 mg by mouth every Monday.   nadolol 40 MG tablet Commonly known as: CORGARD Take 20 mg by mouth daily.   nitroGLYCERIN 0.4 MG SL tablet Commonly known as: NITROSTAT Place 1 tablet (0.4 mg total) under the tongue every 5 (five) minutes as needed for chest pain.   omeprazole 20 MG capsule Commonly known as: PRILOSEC Take 20 mg by mouth daily.   PATADAY OP Place 1 drop into both eyes daily as needed (itching).   predniSONE 10 MG tablet Commonly known as: DELTASONE Take 10 mg by mouth daily.   Probiotic Caps Take 1 capsule by mouth at bedtime.   rizatriptan 10 MG  disintegrating tablet Commonly known as: MAXALT-MLT Take 10 mg by mouth See admin instructions. Take one tablet (10 mg) by mouth daily as needed for "floaters", May repeat in 2 hours if still needed   rosuvastatin 5 MG tablet Commonly known as: Crestor Take 1 tablet (5 mg total) by mouth at bedtime.   traZODone 150 MG tablet Commonly known as: DESYREL Take 225 mg by mouth at bedtime.   vitamin C 1000 MG tablet Take 1,000 mg by mouth at bedtime.   Vitamin D-3 25 MCG (1000 UT) Caps Take 1,000 Units by mouth 2 (two) times daily.      Allergies  Allergen Reactions  . Carbamazepine Other (See Comments)    Parkinsons like symptoms tremors  . Sertraline Hcl Other (See Comments)    Unknown reaction    The results of significant diagnostics from this hospitalization (including imaging, microbiology, ancillary and laboratory) are listed below for reference.    Significant Diagnostic Studies: DG Chest 2 View  Result Date: 11/13/2019 CLINICAL DATA:  Chest pain and back pain EXAM: CHEST - 2 VIEW COMPARISON:  None. FINDINGS: Upper normal heart size. Low lung volumes are present, causing crowding of the pulmonary vasculature. The lungs appear clear; no edema identified. No blunting of the costophrenic angles. Minimal anterior wedging at T12  which could possibly be physiologic. Loss of disc height at T12-L1. IMPRESSION: 1. No acute findings. 2. Minimal anterior wedging at T12 which could possibly be physiologic. Electronically Signed   By: Van Clines M.D.   On: 11/13/2019 14:17   CARDIAC CATHETERIZATION  Result Date: 11/14/2019  Prox LAD to Mid LAD lesion is 30% stenosed.  Prox RCA lesion is 25% stenosed.  LV end diastolic pressure is moderately elevated.  1. Mild nonobstructive CAD. 2. Moderately elevated LV EDP Plan: medical management. Assess LV function by Echo. Potential etiology for chest pain include hypertensive heart disease, stress induced CM, or aortic pathology.    ECHOCARDIOGRAM COMPLETE  Result Date: 11/14/2019    ECHOCARDIOGRAM REPORT   Patient Name:   Valerie [name] Date of Exam: 11/14/2019 Medical Rec #:  [id number]      Height:       62.0 in Accession #:    [place]:[id number]     Weight:       224.7 lb Date of Birth:  1961-05-17       BSA:          2.009 m Patient Age:    59 years       BP:           120/56 mmHg Patient Gender: F              HR:           50 bpm. Exam Location:  Inpatient Procedure: 2D Echo, Cardiac Doppler and Color Doppler Indications:    Chest Pain 786.50  History:        Patient has no prior history of Echocardiogram examinations.                 Signs/Symptoms:Chest Pain; Risk Factors:Current Smoker.  Sonographer:    Vickie Epley RDCS Referring Phys: [id number] [facility] BHAGAT IMPRESSIONS  1. Left ventricular ejection fraction, by estimation, is 60 to 65%. The left ventricle has normal function. The left ventricle has no regional wall motion abnormalities. Left ventricular diastolic parameters were normal.  2. Right ventricular systolic function is normal. The right ventricular size is normal. Tricuspid regurgitation signal is inadequate for assessing PA pressure.  3. The mitral valve is grossly normal. No evidence of mitral valve regurgitation. No evidence of mitral stenosis.  4. The aortic valve is tricuspid. Aortic valve regurgitation is not visualized. No aortic stenosis is present.  5. The inferior vena cava is normal in size with greater than 50% respiratory variability, suggesting right atrial pressure of 3 mmHg. FINDINGS  Left Ventricle: Left ventricular ejection fraction, by estimation, is 60 to 65%. The left ventricle has normal function. The left ventricle has no regional wall motion abnormalities. The left ventricular internal cavity size was normal in size. There is  no left ventricular hypertrophy. Left ventricular diastolic parameters were normal. Normal left ventricular filling pressure. Right Ventricle: The right ventricular size is  normal. No increase in right ventricular wall thickness. Right ventricular systolic function is normal. Tricuspid regurgitation signal is inadequate for assessing PA pressure. Left Atrium: Left atrial size was normal in size. Right Atrium: Right atrial size was normal in size. Prominent Crista terminalis. Pericardium: There is no evidence of pericardial effusion. Presence of pericardial fat pad. Mitral Valve: The mitral valve is grossly normal. No evidence of mitral valve regurgitation. No evidence of mitral valve stenosis. Tricuspid Valve: The tricuspid valve is grossly normal. Tricuspid valve regurgitation is not demonstrated. No evidence of tricuspid stenosis. Aortic Valve: The  aortic valve is tricuspid. Aortic valve regurgitation is not visualized. No aortic stenosis is present. Pulmonic Valve: The pulmonic valve was grossly normal. Pulmonic valve regurgitation is not visualized. No evidence of pulmonic stenosis. Aorta: The aortic root is normal in size and structure. Venous: The inferior vena cava is normal in size with greater than 50% respiratory variability, suggesting right atrial pressure of 3 mmHg. IAS/Shunts: No atrial level shunt detected by color flow Doppler.  LEFT VENTRICLE PLAX 2D LVIDd:         4.60 cm      Diastology LVIDs:         3.20 cm      LV e' lateral:   7.83 cm/s LV PW:         0.80 cm      LV E/e' lateral: 11.0 LV IVS:        0.80 cm      LV e' medial:    6.64 cm/s LVOT diam:     2.00 cm      LV E/e' medial:  13.0 LV SV:         74 LV SV Index:   37 LVOT Area:     3.14 cm  LV Volumes (MOD) LV vol d, MOD A2C: 111.0 ml LV vol d, MOD A4C: 123.0 ml LV vol s, MOD A2C: 50.6 ml LV vol s, MOD A4C: 46.3 ml LV SV MOD A2C:     60.4 ml LV SV MOD A4C:     123.0 ml LV SV MOD BP:      69.6 ml RIGHT VENTRICLE RV S prime:     8.81 cm/s TAPSE (M-mode): 1.7 cm LEFT ATRIUM             Index       RIGHT ATRIUM           Index LA diam:        3.60 cm 1.79 cm/m  RA Area:     13.70 cm LA Vol (A2C):   28.8 ml  14.34 ml/m RA Volume:   31.70 ml  15.78 ml/m LA Vol (A4C):   37.6 ml 18.72 ml/m LA Biplane Vol: 33.1 ml 16.48 ml/m  AORTIC VALVE LVOT Vmax:   119.00 cm/s LVOT Vmean:  70.100 cm/s LVOT VTI:    0.235 m  AORTA Ao Root diam: 3.00 cm MITRAL VALVE MV Area (PHT): 2.18 cm    SHUNTS MV Decel Time: 348 msec    Systemic VTI:  0.24 m MV E velocity: 86.30 cm/s  Systemic Diam: 2.00 cm MV A velocity: 97.50 cm/s MV E/A ratio:  0.89 Eleonore Chiquito MD Electronically signed by Eleonore Chiquito MD Signature Date/Time: 11/14/2019/4:11:53 PM    Final     Microbiology: Recent Results (from the past 240 hour(s))  SARS CORONAVIRUS 2 (TAT 6-24 HRS) Nasopharyngeal Nasopharyngeal Swab     Status: None   Collection Time: 11/13/19  3:34 PM   Specimen: Nasopharyngeal Swab  Result Value Ref Range Status   SARS Coronavirus 2 NEGATIVE NEGATIVE Final    Comment: (NOTE) SARS-CoV-2 target nucleic acids are NOT DETECTED. The SARS-CoV-2 RNA is generally detectable in upper and lower respiratory specimens during the acute phase of infection. Negative results do not preclude SARS-CoV-2 infection, do not rule out co-infections with other pathogens, and should not be used as the sole basis for treatment or other patient management decisions. Negative results must be combined with clinical observations, patient history, and epidemiological information. The expected result is  Negative. Fact Sheet for Patients: SugarRoll.be Fact Sheet for Healthcare Providers: https://www.woods-mathews.com/ This test is not yet approved or cleared by the [place] FDA and  has been authorized for detection and/or diagnosis of SARS-CoV-2 by FDA under an Emergency Use Authorization (EUA). This EUA will remain  in effect (meaning this test can be used) for the duration of the COVID-19 declaration under Section 56 4(b)(1) of the Act, 21 U.S.C. section 360bbb-3(b)(1), unless the authorization is terminated  or revoked sooner. Performed at [facility], [place] [address] [place], [place] [address]      Labs: Basic Metabolic Panel: Recent Labs  Lab 11/13/19 1330 11/13/19 2033 11/14/19 0002  NA 142  --  143  K 4.5  --  5.1  CL 109  --  108  CO2 25  --  30  GLUCOSE 108*  --  127*  BUN 26*  --  26*  CREATININE 1.54* 1.72* 1.62*  CALCIUM 10.4*  --  10.2  MG  --  2.2  --    Liver Function Tests: Recent Labs  Lab 11/13/19 1330  AST 18  ALT 20  ALKPHOS 54  BILITOT 0.5  PROT 5.6*  ALBUMIN 3.0*   Recent Labs  Lab 11/13/19 1330  LIPASE 26   CBC: Recent Labs  Lab 11/13/19 1330 11/13/19 2033 11/14/19 0002  WBC 10.4 16.1* 13.4*  NEUTROABS 8.3*  --   --   HGB 12.3 13.5 11.6*  HCT 38.7 43.4 37.2  MCV 109.9* 109.0* 108.5*  PLT 164 201 184    CBG: Recent Labs  Lab 11/14/19 1653  GLUCAP 157*    Principal Problem:   NSTEMI (non-ST elevated myocardial infarction) ([place]) Active Problems:   Depression   Anxiety   Chest pain   Obesity, Class III, BMI 40-49.9 (morbid obesity) ([facility])   CKD (chronic kidney disease), stage III   Psoriatic arthritis ([facility])   Tobacco abuse   OSA (obstructive sleep apnea)   Neuropathy   Bipolar disorder ([place])   DM type 2 (diabetes mellitus, type 2) ([place])   Time coordinating discharge: 35 minutes  Signed:  Murray Hodgkins, MD  [facility]  11/14/2019, 5:09 PM

## 2019-11-14 NOTE — Progress Notes (Signed)
TR BAND REMOVAL  LOCATION:    right radial  DEFLATED PER PROTOCOL:    Yes.    TIME BAND OFF / DRESSING APPLIED:    1630   SITE UPON ARRIVAL:    Level 0  SITE AFTER BAND REMOVAL:    Level 0  CIRCULATION SENSATION AND MOVEMENT:    Within Normal Limits   Yes.    COMMENTS:   Rechecked site at 1700 with no change in assessment

## 2019-11-14 NOTE — Progress Notes (Signed)
Progress Note  Patient Name: Valerie [name] Date of Encounter: 11/14/2019  Primary Cardiologist: new - Dr. [name]  Subjective   Feels well, no chest pain. 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  Inpatient Medications    Scheduled Meds: . acidophilus  1 capsule Oral QHS  . aspirin  81 mg Oral Pre-Cath  . docusate sodium  200 mg Oral QHS  . donepezil  10 mg Oral Daily  . enoxaparin (LOVENOX) injection  40 mg Subcutaneous Q24H  . escitalopram  30 mg Oral Daily  . fluconazole  100 mg Oral Daily  . hydroxychloroquine  200 mg Oral BID  . lamoTRIgine  75 mg Oral q morning - 10a   And  . lamoTRIgine  150 mg Oral QPM  . loratadine  5 mg Oral BID  . methotrexate  10 mg Oral Q Mon  . nadolol  20 mg Oral Daily  . pantoprazole  40 mg Oral Daily  . predniSONE  10 mg Oral Daily  . traZODone  225 mg Oral QHS   Continuous Infusions: . sodium chloride 75 mL/hr at 11/14/19 0544  . sodium chloride     PRN Meds: acetaminophen **OR** acetaminophen, ALPRAZolam, morphine injection, nitroGLYCERIN, ondansetron **OR** ondansetron (ZOFRAN) IV   Vital Signs    Vitals:   11/13/19 2042 11/13/19 2136 11/13/19 2232 11/14/19 0533  BP: 120/65 (!) 118/92  (!) 120/56  Pulse: 60 (!) 52 (!) 53 (!) 43  Resp: 16 17  18   Temp:  98.4 F (36.9 C)  (!) 97.5 F (36.4 C)  TempSrc:  Oral  Oral  SpO2: 95% 96% 97% 100%  Weight:  101.9 kg  101.9 kg  Height:  5\' 2"  (1.575 m)      Intake/Output Summary (Last 24 hours) at 11/14/2019 0921 Last data filed at 11/14/2019 0828 Gross per 24 hour  Intake 1004.33 ml  Output 526 ml  Net 478.33 ml   Last 3 Weights 11/14/2019 11/13/2019 11/13/2019  Weight (lbs) 224 lb 11.2 oz 224 lb 11.2 oz 235 lb  Weight (kg) 101.923 kg 101.923 kg 106.595 kg      Telemetry    Marked sinus bradycardia - Personally Reviewed  ECG    Sinus bradycardia - Personally Reviewed  Physical Exam   GEN: No acute  distress.   Neck: No JVD Cardiac: RRR, no murmurs, rubs, or gallops. Radial pulse 3/4 bilaterally Respiratory: Clear to auscultation bilaterally. GI: Soft, nontender, non-distended  MS: No edema; No deformity. Neuro:  Nonfocal  Psych: Normal affect   Labs    High Sensitivity Troponin:   Recent Labs  Lab 11/13/19 1330 11/13/19 2033 11/14/19 0002 11/14/19 0231  TROPONINIHS 71* 218* 143* 116*      Chemistry Recent Labs  Lab 11/13/19 1330 11/13/19 2033 11/14/19 0002  NA 142  --  143  K 4.5  --  5.1  CL 109  --  108  CO2 25  --  30  GLUCOSE 108*  --  127*  BUN 26*  --  26*  CREATININE 1.54* 1.72* 1.62*  CALCIUM 10.4*  --  10.2  PROT 5.6*  --   --   ALBUMIN 3.0*  --   --   AST 18  --   --   ALT 20  --   --   ALKPHOS 54  --   --   BILITOT 0.5  --   --   [facility]  37* 32* 34*  GFRAA 42* 37* 40*  ANIONGAP 8  --  5     Hematology Recent Labs  Lab 11/13/19 1330 11/13/19 2033 11/14/19 0002  WBC 10.4 16.1* 13.4*  RBC 3.52* 3.98 3.43*  HGB 12.3 13.5 11.6*  HCT 38.7 43.4 37.2  MCV 109.9* 109.0* 108.5*  MCH 34.9* 33.9 33.8  MCHC 31.8 31.1 31.2  RDW 15.8* 15.7* 15.7*  PLT 164 201 184    BNPNo results for input(s): BNP, PROBNP in the last 168 hours.   DDimer No results for input(s): DDIMER in the last 168 hours.   Radiology    DG Chest 2 View  Result Date: 11/13/2019 CLINICAL DATA:  Chest pain and back pain EXAM: CHEST - 2 VIEW COMPARISON:  None. FINDINGS: Upper normal heart size. Low lung volumes are present, causing crowding of the pulmonary vasculature. The lungs appear clear; no edema identified. No blunting of the costophrenic angles. Minimal anterior wedging at T12 which could possibly be physiologic. Loss of disc height at T12-L1. IMPRESSION: 1. No acute findings. 2. Minimal anterior wedging at T12 which could possibly be physiologic. Electronically Signed   By: Van Clines M.D.   On: 11/13/2019 14:17    Cardiac Studies   none  Patient Profile       59 y.o. female with DM2, HTN and smoking history who presents with nitro responsive chest discomfort, now with NSTEMI  Assessment & Plan    1. NSTEMI - trop 71 >> 218. Smoker and diabetic, HTN. CP free at this time, CP was nitro responsive. Initially felt CCTA may be appropriate however with troponin elevation, will define coronary anatomy with LHC. Shared decision making, patient feels this is the appropriate course of action. Informed consent discussed and obtained. No contraindications to DAPT. Discussed possibility of 3 vessel CAD, PCI vs CABG. -need to obtain lipid panel  INFORMED CONSENT: I have reviewed the risks, indications, and alternatives to cardiac catheterization, possible angioplasty, and stenting with the patient. Risks include but are not limited to bleeding, infection, vascular injury, stroke, myocardial infection, arrhythmia, kidney injury, radiation-related injury in the case of prolonged fluoroscopy use, emergency cardiac surgery, and death. The patient understands the risks of serious complication is 1-2 in [id number] with diagnostic cardiac cath and 1-2% or less with angioplasty/stenting.   2. HTN - takes nadolol, says she takes that because she didn't have good heart rate recovery with metoprolol, has a history of tachycardia. BP controlled here.  3. Bradycardia - will hold nadolol this morning, she is not symptomatic with bradycardia, consider therapy change after cath depending on disease.   4. DM2 per IM 5. CKD - received 75 cc/hr overnight prehydration, consider stopping fluids after cath, patient can drink to thirst.          For questions or updates, please contact [facility] Please consult [email or web address] for contact info under        Signed, [name], MD  11/14/2019, 9:21 AM

## 2019-11-14 NOTE — Progress Notes (Signed)
At 1315 patient complained of her "foaters being back" and requested her maxalt.  Patient states that she has a new prescription for maxalt that she has not filled yet.  Dr. Sarajane Jews notified, stated he would be up shortly to assess patient.

## 2019-11-14 NOTE — Discharge Instructions (Signed)
Radial Site Care  This sheet gives you information about how to care for yourself after your procedure. Your health care provider may also give you more specific instructions. If you have problems or questions, contact your health care provider. What can I expect after the procedure? After the procedure, it is common to have:  Bruising and tenderness at the catheter insertion area. Follow these instructions at home: Medicines  Take over-the-counter and prescription medicines only as told by your health care provider. Insertion site care  Follow instructions from your health care provider about how to take care of your insertion site. Make sure you: ? Wash your hands with soap and water before you change your bandage (dressing). If soap and water are not available, use hand sanitizer. ? Change your dressing as told by your health care provider. ? Leave stitches (sutures), skin glue, or adhesive strips in place. These skin closures may need to stay in place for 2 weeks or longer. If adhesive strip edges start to loosen and curl up, you may trim the loose edges. Do not remove adhesive strips completely unless your health care provider tells you to do that.  Check your insertion site every day for signs of infection. Check for: ? Redness, swelling, or pain. ? Fluid or blood. ? Pus or a bad smell. ? Warmth.  Do not take baths, swim, or use a hot tub until your health care provider approves.  You may shower 24-48 hours after the procedure, or as directed by your health care provider. ? Remove the dressing and gently wash the site with plain soap and water. ? Pat the area dry with a clean towel. ? Do not rub the site. That could cause bleeding.  Do not apply powder or lotion to the site. Activity   For 24 hours after the procedure, or as directed by your health care provider: ? Do not flex or bend the affected arm. ? Do not push or pull heavy objects with the affected arm. ? Do not  drive yourself home from the hospital or clinic. You may drive 24 hours after the procedure unless your health care provider tells you not to. ? Do not operate machinery or power tools.  Do not lift anything that is heavier than 10 lb (4.5 kg), or the limit that you are told, until your health care provider says that it is safe.  Ask your health care provider when it is okay to: ? Return to work or school. ? Resume usual physical activities or sports. ? Resume sexual activity. General instructions  If the catheter site starts to bleed, raise your arm and put firm pressure on the site. If the bleeding does not stop, get help right away. This is a medical emergency.  If you went home on the same day as your procedure, a responsible adult should be with you for the first 24 hours after you arrive home.  Keep all follow-up visits as told by your health care provider. This is important. Contact a health care provider if:  You have a fever.  You have redness, swelling, or yellow drainage around your insertion site. Get help right away if:  You have unusual pain at the radial site.  The catheter insertion area swells very fast.  The insertion area is bleeding, and the bleeding does not stop when you hold steady pressure on the area.  Your arm or hand becomes pale, cool, tingly, or numb. These symptoms may represent a serious problem   that is an emergency. Do not wait to see if the symptoms will go away. Get medical help right away. Call your local emergency services (911 in the [place] Do not drive yourself to the hospital. Summary  After the procedure, it is common to have bruising and tenderness at the site.  Follow instructions from your health care provider about how to take care of your radial site wound. Check the wound every day for signs of infection.  Do not lift anything that is heavier than 10 lb (4.5 kg), or the limit that you are told, until your health care provider says  that it is safe. This information is not intended to replace advice given to you by your health care provider. Make sure you discuss any questions you have with your health care provider. Document Revised: 09/15/2017 Document Reviewed: 09/15/2017 Elsevier Patient Education  [place].   Diabetes Mellitus and Nutrition, Adult When you have diabetes (diabetes mellitus), it is very important to have healthy eating habits because your blood sugar (glucose) levels are greatly affected by what you eat and drink. Eating healthy foods in the appropriate amounts, at about the same times every day, can help you:  Control your blood glucose.  Lower your risk of heart disease.  Improve your blood pressure.  Reach or maintain a healthy weight. Every person with diabetes is different, and each person has different needs for a meal plan. Your health care provider may recommend that you work with a diet and nutrition specialist (dietitian) to make a meal plan that is best for you. Your meal plan may vary depending on factors such as:  The calories you need.  The medicines you take.  Your weight.  Your blood glucose, blood pressure, and cholesterol levels.  Your activity level.  Other health conditions you have, such as heart or kidney disease. How do carbohydrates affect me? Carbohydrates, also called carbs, affect your blood glucose level more than any other type of food. Eating carbs naturally raises the amount of glucose in your blood. Carb counting is a method for keeping track of how many carbs you eat. Counting carbs is important to keep your blood glucose at a healthy level, especially if you use insulin or take certain oral diabetes medicines. It is important to know how many carbs you can safely have in each meal. This is different for every person. Your dietitian can help you calculate how many carbs you should have at each meal and for each snack. Foods that contain carbs  include:  Bread, cereal, rice, pasta, and crackers.  Potatoes and corn.  Peas, beans, and lentils.  Milk and yogurt.  Fruit and juice.  Desserts, such as cakes, cookies, ice cream, and candy. How does alcohol affect me? Alcohol can cause a sudden decrease in blood glucose (hypoglycemia), especially if you use insulin or take certain oral diabetes medicines. Hypoglycemia can be a life-threatening condition. Symptoms of hypoglycemia (sleepiness, dizziness, and confusion) are similar to symptoms of having too much alcohol. If your health care provider says that alcohol is safe for you, follow these guidelines:  Limit alcohol intake to no more than 1 drink per day for nonpregnant women and 2 drinks per day for men. One drink equals 12 oz of beer, 5 oz of wine, or 1 oz of hard liquor.  Do not drink on an empty stomach.  Keep yourself hydrated with water, diet soda, or unsweetened iced tea.  Keep in mind that regular soda, juice,  and other mixers may contain a lot of sugar and must be counted as carbs. What are tips for following this plan?  Reading food labels  Start by checking the serving size on the "Nutrition Facts" label of packaged foods and drinks. The amount of calories, carbs, fats, and other nutrients listed on the label is based on one serving of the item. Many items contain more than one serving per package.  Check the total grams (g) of carbs in one serving. You can calculate the number of servings of carbs in one serving by dividing the total carbs by 15. For example, if a food has 30 g of total carbs, it would be equal to 2 servings of carbs.  Check the number of grams (g) of saturated and trans fats in one serving. Choose foods that have low or no amount of these fats.  Check the number of milligrams (mg) of salt (sodium) in one serving. Most people should limit total sodium intake to less than 2,300 mg per day.  Always check the nutrition information of foods labeled  as "low-fat" or "nonfat". These foods may be higher in added sugar or refined carbs and should be avoided.  Talk to your dietitian to identify your daily goals for nutrients listed on the label. Shopping  Avoid buying canned, premade, or processed foods. These foods tend to be high in fat, sodium, and added sugar.  Shop around the outside edge of the grocery store. This includes fresh fruits and vegetables, bulk grains, fresh meats, and fresh dairy. Cooking  Use low-heat cooking methods, such as baking, instead of high-heat cooking methods like deep frying.  Cook using healthy oils, such as olive, canola, or sunflower oil.  Avoid cooking with butter, cream, or high-fat meats. Meal planning  Eat meals and snacks regularly, preferably at the same times every day. Avoid going long periods of time without eating.  Eat foods high in fiber, such as fresh fruits, vegetables, beans, and whole grains. Talk to your dietitian about how many servings of carbs you can eat at each meal.  Eat 4-6 ounces (oz) of lean protein each day, such as lean meat, chicken, fish, eggs, or tofu. One oz of lean protein is equal to: ? 1 oz of meat, chicken, or fish. ? 1 egg. ?  cup of tofu.  Eat some foods each day that contain healthy fats, such as avocado, nuts, seeds, and fish. Lifestyle  Check your blood glucose regularly.  Exercise regularly as told by your health care provider. This may include: ? 150 minutes of moderate-intensity or vigorous-intensity exercise each week. This could be brisk walking, biking, or water aerobics. ? Stretching and doing strength exercises, such as yoga or weightlifting, at least 2 times a week.  Take medicines as told by your health care provider.  Do not use any products that contain nicotine or tobacco, such as cigarettes and e-cigarettes. If you need help quitting, ask your health care provider.  Work with a Social worker or diabetes educator to identify strategies to  manage stress and any emotional and social challenges. Questions to ask a health care provider  Do I need to meet with a diabetes educator?  Do I need to meet with a dietitian?  What number can I call if I have questions?  When are the best times to check my blood glucose? Where to find more information:  American Diabetes Association: diabetes.org  Academy of Nutrition and Dietetics: www.eatright.[employer] of Diabetes and  Digestive and Kidney Diseases (NIH): [employer] Summary  A healthy meal plan will help you control your blood glucose and maintain a healthy lifestyle.  Working with a diet and nutrition specialist (dietitian) can help you make a meal plan that is best for you.  Keep in mind that carbohydrates (carbs) and alcohol have immediate effects on your blood glucose levels. It is important to count carbs and to use alcohol carefully. This information is not intended to replace advice given to you by your health care provider. Make sure you discuss any questions you have with your health care provider. Document Revised: 07/23/2017 Document Reviewed: 09/14/2016 Elsevier Patient Education  2020 [employer].

## 2019-11-14 NOTE — Interval H&P Note (Signed)
History and Physical Interval Note:  11/14/2019 10:33 AM  Valerie [name]  has presented today for surgery, with the diagnosis of SOB.  The various methods of treatment have been discussed with the patient and family. After consideration of risks, benefits and other options for treatment, the patient has consented to  Procedure(s): LEFT HEART CATH AND CORONARY ANGIOGRAPHY (N/A) as a surgical intervention.  The patient's history has been reviewed, patient examined, no change in status, stable for surgery.  I have reviewed the patient's chart and labs.  Questions were answered to the patient's satisfaction.   Cath Lab Visit (complete for each Cath Lab visit)  Clinical Evaluation Leading to the Procedure:   ACS: Yes.    Non-ACS:    Anginal Classification: CCS IV  Anti-ischemic medical therapy: Minimal Therapy (1 class of medications)  Non-Invasive Test Results: No non-invasive testing performed  Prior CABG: No previous CABG        [facility] 11/14/2019 10:33 AM

## 2019-11-14 NOTE — Progress Notes (Signed)
PROGRESS NOTE  Valerie [name] [id number] DOB: [birth year] DOA: 11/13/2019 PCP: System, Pcp Not In  Brief History   59 year old woman PMH including diabetes mellitus with peripheral neuropathy, morbid obesity presented with chest pain and found to have elevated troponin. She was admitted for further evaluation of chest pain elevated troponin, plans for echocardiogram, seen by cardiology with plans for coronary CT.  A & P  NSTEMI. Risk factors include tobacco smoking, and oriented obesity, sedentary lifestyle, diabetes mellitus and family history of CAD.   EKG without acute ischemic changes.   --Troponin 71 > 218 --Given elevated troponin, left heart catheterization was recommended rather than coronary CTA. 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  Potential etiology for chest pain includes hypertensive heart disease, stress-induced cardiomyopathy or aortic pathology. --Follow-up echocardiogram, cardiology recommendations  CKD stage IIIb --Appears stable  Essential hypertension on nadolol, cardiology considering changing to metoprolol   Diabetes mellitus type 2 diet controlled with peripheral neuropathy,  Random blood sugar 127 Add sliding scale insulin  Psoriatic arthritis on chronic steroids --Continue methotrexate, prednisone, Plaquenil  Cigarette smoker 1 pack/day --Will recommend cessation  Depression, anxiety --Continue Xanax, Aricept, Lexapro, Lamictal, trazodone  OSA --Continue CPAP at night  Disposition Plan:  From: home Anticipated disposition: home Discussion: Patient with NSTEMI.  Catheterization with nonobstructive disease.  Echocardiogram pending.  Cardiology recommendations pending.  Home when cleared by cardiology.  DVT prophylaxis: enoxaparin Code Status: Full Family Communication: sister at bedside   Murray Hodgkins, MD  [facility] Direct contact: see  [email or web address] (further directions at bottom of note if needed) 7PM-7AM contact night coverage as at bottom of note 11/14/2019, 1:42 PM  LOS: 0 days   Consults:  Cardiology   Procedures:  .   Interval History/Subjective  Feels okay, had some twinges in her chest earlier but nothing like previously.  No shortness of breath with this.  No lower extremity edema.  Does report floaters which her doctor thinks is related to migraines and has recommended Maxalt.  Objective   Vitals:  Vitals:   11/14/19 1104 11/14/19 1104  BP: (!) 153/84 (!) 157/83  Pulse: (!) 49 (!) 49  Resp: (!) 27 (!) 21  Temp:    SpO2: 92% 92%    Exam:  Constitutional.  Appears calm, comfortable. Respiratory.  Clear to auscultation bilaterally.  No wheezes, rales or rhonchi.  Normal respiratory effort. Cardiovascular.  Bradycardic, regular rhythm rhythm.  No murmur, rub or gallop.  No lower extremity edema.  Telemetry marked sinus bradycardia. Psychiatric.  Grossly normal mood and affect.  Speech fluent and appropriate.  I have personally reviewed the following:   Today's Data  . BMP unremarkable, chronic kidney disease appears stable . CBC with modest leukocytosis, otherwise unremarkable  Scheduled Meds: . acidophilus  1 capsule Oral QHS  . docusate sodium  200 mg Oral QHS  . donepezil  10 mg Oral Daily  . [START ON 11/15/2019] enoxaparin (LOVENOX) injection  40 mg Subcutaneous Q24H  . escitalopram  30 mg Oral Daily  . fluconazole  100 mg Oral Daily  . hydroxychloroquine  200 mg Oral BID  . insulin aspart  0-6 Units Subcutaneous TID WC  . lamoTRIgine  75 mg Oral q morning - 10a   And  . lamoTRIgine  150 mg Oral QPM  . loratadine  5 mg Oral BID  . methotrexate  10 mg Oral Q Mon  . nadolol  20 mg Oral Daily  .  pantoprazole  40 mg Oral Daily  . predniSONE  10 mg Oral Daily  . sodium chloride flush  3 mL Intravenous Q12H  . traZODone  225 mg Oral QHS   Continuous Infusions: . sodium chloride Stopped  (11/14/19 1019)  . sodium chloride    . sodium chloride 1 mL/kg/hr (11/14/19 1134)    Principal Problem:   NSTEMI (non-ST elevated myocardial infarction) ([place]) Active Problems:   Depression   Anxiety   Chest pain   Obesity, Class III, BMI 40-49.9 (morbid obesity) (HCC)   CKD (chronic kidney disease), stage III   Psoriatic arthritis (HCC)   Tobacco abuse   OSA (obstructive sleep apnea)   Neuropathy   Bipolar disorder ([place])   DM type 2 (diabetes mellitus, type 2) ([place])   LOS: 0 days   How to contact the [facility] Attending or Consulting provider [place] or covering provider during after hours [place], for this patient?  1. Check the care team in [facility] and look for a) attending/consulting [facility] provider listed and b) the [facility] team listed 2. Log into [email or web address] and use [place]'s universal password to access. If you do not have the password, please contact the hospital operator. 3. Locate the [facility] provider you are looking for under [facility] and page to a number that you can be directly reached. 4. If you still have difficulty reaching the provider, please page the [facility] (Director on Call) for the Hospitalists listed on amion for assistance.

## 2019-11-14 NOTE — Progress Notes (Signed)
Echo shows preserved EF and no hemodynamically significant valvular heart disease.  Dismissal CV med recommendations:   Asa 81mg   Nadolol 20 mg   Crestor 5 mg   F/u in office with Dr. [name] 1-2 weeks.

## 2019-11-14 NOTE — Progress Notes (Addendum)
Instruction received from Dr Sarajane Jews to have patient hold Nadolol for now until patient hears from Dr [name].  This instruction was added to the discharge paperwork.  Patient instructed to follow up tomorrow afternoon if she has not heard from Dr [name].   Discharge instructions reviewed with and given to patient.   Patient in no active distress and taken to private vehicle via wheelchair.

## 2019-11-14 NOTE — Progress Notes (Signed)
Attempted echo but patient had just received food and wanted me to come back later.

## 2019-11-14 NOTE — H&P (View-Only) (Signed)
Progress Note  Patient Name: Valerie [name] Date of Encounter: 11/14/2019  Primary Cardiologist: new - Dr. [name]  Subjective   Feels well, no chest pain. 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  Inpatient Medications    Scheduled Meds: . acidophilus  1 capsule Oral QHS  . aspirin  81 mg Oral Pre-Cath  . docusate sodium  200 mg Oral QHS  . donepezil  10 mg Oral Daily  . enoxaparin (LOVENOX) injection  40 mg Subcutaneous Q24H  . escitalopram  30 mg Oral Daily  . fluconazole  100 mg Oral Daily  . hydroxychloroquine  200 mg Oral BID  . lamoTRIgine  75 mg Oral q morning - 10a   And  . lamoTRIgine  150 mg Oral QPM  . loratadine  5 mg Oral BID  . methotrexate  10 mg Oral Q Mon  . nadolol  20 mg Oral Daily  . pantoprazole  40 mg Oral Daily  . predniSONE  10 mg Oral Daily  . traZODone  225 mg Oral QHS   Continuous Infusions: . sodium chloride 75 mL/hr at 11/14/19 0544  . sodium chloride     PRN Meds: acetaminophen **OR** acetaminophen, ALPRAZolam, morphine injection, nitroGLYCERIN, ondansetron **OR** ondansetron (ZOFRAN) IV   Vital Signs    Vitals:   11/13/19 2042 11/13/19 2136 11/13/19 2232 11/14/19 0533  BP: 120/65 (!) 118/92  (!) 120/56  Pulse: 60 (!) 52 (!) 53 (!) 43  Resp: 16 17  18   Temp:  98.4 F (36.9 C)  (!) 97.5 F (36.4 C)  TempSrc:  Oral  Oral  SpO2: 95% 96% 97% 100%  Weight:  101.9 kg  101.9 kg  Height:  5\' 2"  (1.575 m)      Intake/Output Summary (Last 24 hours) at 11/14/2019 0921 Last data filed at 11/14/2019 0828 Gross per 24 hour  Intake 1004.33 ml  Output 526 ml  Net 478.33 ml   Last 3 Weights 11/14/2019 11/13/2019 11/13/2019  Weight (lbs) 224 lb 11.2 oz 224 lb 11.2 oz 235 lb  Weight (kg) 101.923 kg 101.923 kg 106.595 kg      Telemetry    Marked sinus bradycardia - Personally Reviewed  ECG    Sinus bradycardia - Personally Reviewed  Physical Exam   GEN: No acute  distress.   Neck: No JVD Cardiac: RRR, no murmurs, rubs, or gallops. Radial pulse 3/4 bilaterally Respiratory: Clear to auscultation bilaterally. GI: Soft, nontender, non-distended  MS: No edema; No deformity. Neuro:  Nonfocal  Psych: Normal affect   Labs    High Sensitivity Troponin:   Recent Labs  Lab 11/13/19 1330 11/13/19 2033 11/14/19 0002 11/14/19 0231  TROPONINIHS 71* 218* 143* 116*      Chemistry Recent Labs  Lab 11/13/19 1330 11/13/19 2033 11/14/19 0002  NA 142  --  143  K 4.5  --  5.1  CL 109  --  108  CO2 25  --  30  GLUCOSE 108*  --  127*  BUN 26*  --  26*  CREATININE 1.54* 1.72* 1.62*  CALCIUM 10.4*  --  10.2  PROT 5.6*  --   --   ALBUMIN 3.0*  --   --   AST 18  --   --   ALT 20  --   --   ALKPHOS 54  --   --   BILITOT 0.5  --   --   [facility]  37* 32* 34*  GFRAA 42* 37* 40*  ANIONGAP 8  --  5     Hematology Recent Labs  Lab 11/13/19 1330 11/13/19 2033 11/14/19 0002  WBC 10.4 16.1* 13.4*  RBC 3.52* 3.98 3.43*  HGB 12.3 13.5 11.6*  HCT 38.7 43.4 37.2  MCV 109.9* 109.0* 108.5*  MCH 34.9* 33.9 33.8  MCHC 31.8 31.1 31.2  RDW 15.8* 15.7* 15.7*  PLT 164 201 184    BNPNo results for input(s): BNP, PROBNP in the last 168 hours.   DDimer No results for input(s): DDIMER in the last 168 hours.   Radiology    DG Chest 2 View  Result Date: 11/13/2019 CLINICAL DATA:  Chest pain and back pain EXAM: CHEST - 2 VIEW COMPARISON:  None. FINDINGS: Upper normal heart size. Low lung volumes are present, causing crowding of the pulmonary vasculature. The lungs appear clear; no edema identified. No blunting of the costophrenic angles. Minimal anterior wedging at T12 which could possibly be physiologic. Loss of disc height at T12-L1. IMPRESSION: 1. No acute findings. 2. Minimal anterior wedging at T12 which could possibly be physiologic. Electronically Signed   By: Van Clines M.D.   On: 11/13/2019 14:17    Cardiac Studies   none  Patient Profile       59 y.o. female with DM2, HTN and smoking history who presents with nitro responsive chest discomfort, now with NSTEMI  Assessment & Plan    1. NSTEMI - trop 71 >> 218. Smoker and diabetic, HTN. CP free at this time, CP was nitro responsive. Initially felt CCTA may be appropriate however with troponin elevation, will define coronary anatomy with LHC. Shared decision making, patient feels this is the appropriate course of action. Informed consent discussed and obtained. No contraindications to DAPT. Discussed possibility of 3 vessel CAD, PCI vs CABG. -need to obtain lipid panel  INFORMED CONSENT: I have reviewed the risks, indications, and alternatives to cardiac catheterization, possible angioplasty, and stenting with the patient. Risks include but are not limited to bleeding, infection, vascular injury, stroke, myocardial infection, arrhythmia, kidney injury, radiation-related injury in the case of prolonged fluoroscopy use, emergency cardiac surgery, and death. The patient understands the risks of serious complication is 1-2 in [id number] with diagnostic cardiac cath and 1-2% or less with angioplasty/stenting.   2. HTN - takes nadolol, says she takes that because she didn't have good heart rate recovery with metoprolol, has a history of tachycardia. BP controlled here.  3. Bradycardia - will hold nadolol this morning, she is not symptomatic with bradycardia, consider therapy change after cath depending on disease.   4. DM2 per IM 5. CKD - received 75 cc/hr overnight prehydration, consider stopping fluids after cath, patient can drink to thirst.          For questions or updates, please contact [place] Please consult [email or web address] for contact info under        Signed, [name], MD  11/14/2019, 9:21 AM

## 2019-11-14 NOTE — Progress Notes (Signed)
  Echocardiogram 2D Echocardiogram has been performed.  Geoffery Lyons Swaim 11/14/2019, 2:13 PM

## 2019-12-04 ENCOUNTER — Encounter: Payer: Self-pay | Admitting: Internal Medicine

## 2019-12-04 ENCOUNTER — Ambulatory Visit (INDEPENDENT_AMBULATORY_CARE_PROVIDER_SITE_OTHER): Payer: Medicare Other | Admitting: Internal Medicine

## 2019-12-04 ENCOUNTER — Other Ambulatory Visit: Payer: Self-pay

## 2019-12-04 VITALS — BP 130/76 | HR 99 | Ht 61.0 in | Wt 230.0 lb

## 2019-12-04 DIAGNOSIS — I214 Non-ST elevation (NSTEMI) myocardial infarction: Secondary | ICD-10-CM

## 2019-12-04 DIAGNOSIS — I10 Essential (primary) hypertension: Secondary | ICD-10-CM | POA: Diagnosis not present

## 2019-12-04 DIAGNOSIS — E1140 Type 2 diabetes mellitus with diabetic neuropathy, unspecified: Secondary | ICD-10-CM

## 2019-12-04 DIAGNOSIS — E782 Mixed hyperlipidemia: Secondary | ICD-10-CM

## 2019-12-04 DIAGNOSIS — G4733 Obstructive sleep apnea (adult) (pediatric): Secondary | ICD-10-CM

## 2019-12-04 DIAGNOSIS — N1832 Chronic kidney disease, stage 3b: Secondary | ICD-10-CM

## 2019-12-04 MED ORDER — NADOLOL 20 MG PO TABS: 10.0000 mg | ORAL_TABLET | Freq: Every day | ORAL | 1 refills | Status: DC

## 2019-12-04 NOTE — Progress Notes (Signed)
Cardiology Office Note:    Date:  12/04/2019   ID:  Valerie [name], DOB 04-25-1961, MRN [id number]  PCP:  System, Pcp Not In  Cardiologist:  [name], MD  Electrophysiologist:  None   Referring MD: No ref. provider found   Chief Complaint: hospital follow up for NSTEMI  History of Present Illness:    Valerie [name] is a 59 y.o. female with a history of diabetes mellitus with peripheral neuropathy, morbid obesity presented with chest pain and found to have elevated troponin. Underwent coronary angiography which demonstrated nonobstructive disease. LVEDP 25 mmHg on cath. NSTEMI secondary to [facility], etiology may include HTN heart disease or small vessel disease in the setting of diabetes.   The patient denies chest pain, chest pressure, dyspnea at rest or with exertion, palpitations, PND, orthopnea. Improved leg swelling. Denies cough, fever, chills. Denies nausea, vomiting. Denies syncope or presyncope. Denies dizziness or lightheadedness.    Past Medical History:  Diagnosis Date  . Anxiety   . Arthritis    Psoriatic  . Depression   . NSTEMI (non-ST elevated myocardial infarction) ([place]) 11/14/2019  . Sleep apnea     Past Surgical History:  Procedure Laterality Date  . LEFT HEART CATH AND CORONARY ANGIOGRAPHY N/A 11/14/2019   Procedure: LEFT HEART CATH AND CORONARY ANGIOGRAPHY;  Surgeon: Martinique, Peter M, MD;  Location: [place] [facility];  Service: Cardiovascular;  Laterality: N/A;    Current Medications: Current Meds  Medication Sig  . ALPRAZolam (XANAX) 0.5 MG tablet Take 0.5 mg by mouth 3 (three) times daily as needed for anxiety.   . Ascorbic Acid (VITAMIN C) 1000 MG tablet Take 1,000 mg by mouth at bedtime.  Marland Kitchen aspirin EC 81 MG tablet Take 1 tablet (81 mg total) by mouth daily.  Marland Kitchen b complex vitamins tablet Take 2 tablets by mouth at bedtime.  . Cholecalciferol (VITAMIN D-3) 1000 units CAPS Take 1,000 Units by mouth 2 (two) times daily.   Marland Kitchen docusate sodium (COLACE)  100 MG capsule Take 100-200 mg by mouth at bedtime.  . donepezil (ARICEPT) 10 MG tablet Take 10 mg by mouth daily.  Marland Kitchen escitalopram (LEXAPRO) 20 MG tablet Take 30 mg by mouth daily.   Marland Kitchen etanercept (ENBREL) 50 MG/ML injection Inject 50 mg into the skin every Saturday.  . fluconazole (DIFLUCAN) 100 MG tablet Take 100 mg by mouth daily.  Marland Kitchen HYDROcodone-acetaminophen (NORCO) 10-325 MG tablet Take 1 tablet by mouth 2 (two) times daily as needed (pain).   . hydroxychloroquine (PLAQUENIL) 200 MG tablet Take 200 mg by mouth 2 (two) times daily.   Marland Kitchen lamoTRIgine (LAMICTAL) 150 MG tablet Take 75-150 mg by mouth See admin instructions. Take 1/2 tablet (75 mg) by mouth every morning and 1 tablet (150 mg) at night  . levocetirizine (XYZAL) 5 MG tablet Take 5 mg by mouth 2 (two) times daily.   . methotrexate (RHEUMATREX) 2.5 MG tablet Take 10 mg by mouth every Monday.  . nitroGLYCERIN (NITROSTAT) 0.4 MG SL tablet Place 1 tablet (0.4 mg total) under the tongue every 5 (five) minutes as needed for chest pain.  Marland Kitchen Olopatadine HCl (PATADAY OP) Place 1 drop into both eyes daily as needed (itching).  Marland Kitchen omeprazole (PRILOSEC) 20 MG capsule Take 20 mg by mouth daily.   . predniSONE (DELTASONE) 10 MG tablet Take 10 mg by mouth daily.   . Probiotic CAPS Take 1 capsule by mouth at bedtime.  . rizatriptan (MAXALT-MLT) 10 MG disintegrating tablet Take 10 mg by mouth  See admin instructions. Take one tablet (10 mg) by mouth daily as needed for "floaters", May repeat in 2 hours if still needed  . rosuvastatin (CRESTOR) 5 MG tablet Take 1 tablet (5 mg total) by mouth at bedtime.  . traZODone (DESYREL) 150 MG tablet Take 225 mg by mouth at bedtime.  . [DISCONTINUED] nadolol (CORGARD) 40 MG tablet Take 20 mg by mouth daily.     Allergies:   Carbamazepine and Sertraline hcl   Social History   Socioeconomic History  . Marital status: Single    Spouse name: Not on file  . Number of children: Not on file  . Years of education:  Not on file  . Highest education level: Not on file  Occupational History  . Not on file  Tobacco Use  . Smoking status: Current Every Day Smoker  . Smokeless tobacco: Never Used  Substance and Sexual Activity  . Alcohol use: Not on file  . Drug use: Not on file  . Sexual activity: Not on file  Other Topics Concern  . Not on file  Social History Narrative  . Not on file   Social Determinants of Health   Financial Resource Strain:   . Difficulty of Paying Living Expenses:   Food Insecurity:   . Worried About Charity fundraiser in the Last Year:   . Arboriculturist in the Last Year:   Transportation Needs:   . Film/video editor (Medical):   Marland Kitchen Lack of Transportation (Non-Medical):   Physical Activity:   . Days of Exercise per Week:   . Minutes of Exercise per Session:   Stress:   . Feeling of Stress :   Social Connections:   . Frequency of Communication with Friends and Family:   . Frequency of Social Gatherings with Friends and Family:   . Attends Religious Services:   . Active Member of Clubs or Organizations:   . Attends Archivist Meetings:   Marland Kitchen Marital Status:      Family History: The patient's family history is negative for Breast cancer.  ROS:   Please see the history of present illness.    All other systems reviewed and are negative.  EKGs/Labs/Other Studies Reviewed:    The following studies were reviewed today:  EKG:  NSR, low voltage, inferior infarct pattern, possible anterolateral infarct pattern - poor R wave progression.   Recent Labs: 11/13/2019: ALT 20; Magnesium 2.2 11/14/2019: BUN 26; Creatinine, Ser 1.62; Hemoglobin 11.6; Platelets 184; Potassium 5.1; Sodium 143  Recent Lipid Panel    Component Value Date/Time   CHOL 210 (H) 11/14/2019 1658   TRIG 202 (H) 11/14/2019 1658   HDL 37 (L) 11/14/2019 1658   CHOLHDL 5.7 11/14/2019 1658   VLDL 40 11/14/2019 1658   LDLCALC 133 (H) 11/14/2019 1658    Physical Exam:    VS:  BP  130/76 (BP Location: Left Arm, Patient Position: Sitting, Cuff Size: Large)   Pulse 99   Ht 5\' 1"  (1.549 m)   Wt 230 lb (104.3 kg)   BMI 43.46 kg/m     Wt Readings from Last 5 Encounters:  12/04/19 230 lb (104.3 kg)  11/14/19 224 lb 11.2 oz (101.9 kg)     Constitutional: No acute distress Eyes: sclera non-icteric, normal conjunctiva and lids ENMT: normal dentition, moist mucous membranes Cardiovascular: regular rhythm, normal rate, no murmurs. S1 and S2 normal. Radial pulses normal bilaterally. No jugular venous distention.  Respiratory: clear to auscultation bilaterally GI :  normal bowel sounds, soft and nontender. No distention.   MSK: extremities warm, well perfused. No edema.  NEURO: grossly nonfocal exam, moves all extremities. PSYCH: alert and oriented x 3, normal mood and affect.      ASSESSMENT:    1. Non-ST elevation (NSTEMI) myocardial infarction ([place])   2. Mixed hyperlipidemia   3. Essential hypertension   4. Type 2 diabetes mellitus with diabetic neuropathy, without long-term current use of insulin ([place])   5. OSA (obstructive sleep apnea)   6. Stage 3b chronic kidney disease    PLAN:    Non-ST elevation (NSTEMI) myocardial infarction (H[facility]) -  Continue ASA, BB, crestor 5 mg daily. Continue aggressive risk factor modification.  Plan: EKG 12-Lead  Mixed hyperlipidemia - continue crestor, recheck lipids at next visit in 3 mo.   Essential hypertension - adequate control. Patient feels she does better on nadolol than metoprolol due to HR response with activity, and feels she would do better on 10 mg daily.   Type 2 diabetes mellitus with diabetic neuropathy, without long-term current use of insulin (HCC) - per PCP. Last A1C 5.8.   OSA (obstructive sleep apnea) - encouraged CPAP.   Total time of encounter: 30 minutes total time of encounter, including 25 minutes spent in face-to-face patient care on the date of this encounter. This time includes coordination of  care and counseling regarding above mentioned problem list. Remainder of non-face-to-face time involved reviewing chart documents/testing relevant to the patient encounter and documentation in the medical record. I have independently reviewed documentation from referring provider.   Cherlynn Kaiser, MD [place]  [facility]    Medication Adjustments/Labs and Tests Ordered: Current medicines are reviewed at length with the patient today.  Concerns regarding medicines are outlined above.  Orders Placed This Encounter  Procedures  . EKG 12-Lead   Meds ordered this encounter  Medications  . nadolol (CORGARD) 20 MG tablet    Sig: Take 0.5 tablets (10 mg total) by mouth daily.    Dispense:  90 tablet    Refill:  1    Patient Instructions  Medication Instructions:  Take Nadolol 10 mg (this is a half of tablet 20 mg)   *If you need a refill on your cardiac medications before your next appointment, please call your pharmacy*   Follow-Up: At [facility], you and your health needs are our priority.  As part of our continuing mission to provide you with exceptional heart care, we have created designated Provider Care Teams.  These Care Teams include your primary Cardiologist (physician) and Advanced Practice Providers (APPs -  Physician Assistants and Nurse Practitioners) who all work together to provide you with the care you need, when you need it.  We recommend signing up for the patient portal called "MyChart".  Sign up information is provided on this After Visit Summary.  MyChart is used to connect with patients for Virtual Visits (Telemedicine).  Patients are able to view lab/test results, encounter notes, upcoming appointments, etc.  Non-urgent messages can be sent to your provider as well.   To learn more about what you can do with MyChart, go to [email or web address].    Your next appointment:   3 month(s)  The format for your next appointment:   In Person  Provider:    Cherlynn Kaiser, MD

## 2019-12-04 NOTE — Patient Instructions (Signed)
Medication Instructions:  Take Nadolol 10 mg (this is a half of tablet 20 mg)   *If you need a refill on your cardiac medications before your next appointment, please call your pharmacy*   Follow-Up: At [facility], you and your health needs are our priority.  As part of our continuing mission to provide you with exceptional heart care, we have created designated Provider Care Teams.  These Care Teams include your primary Cardiologist (physician) and Advanced Practice Providers (APPs -  Physician Assistants and Nurse Practitioners) who all work together to provide you with the care you need, when you need it.  We recommend signing up for the patient portal called "MyChart".  Sign up information is provided on this After Visit Summary.  MyChart is used to connect with patients for Virtual Visits (Telemedicine).  Patients are able to view lab/test results, encounter notes, upcoming appointments, etc.  Non-urgent messages can be sent to your provider as well.   To learn more about what you can do with MyChart, go to [email or web address].    Your next appointment:   3 month(s)  The format for your next appointment:   In Person  Provider:   Cherlynn Kaiser, MD

## 2020-01-09 ENCOUNTER — Telehealth: Payer: Self-pay | Admitting: *Deleted

## 2020-01-09 DIAGNOSIS — I214 Non-ST elevation (NSTEMI) myocardial infarction: Secondary | ICD-10-CM

## 2020-01-09 DIAGNOSIS — E782 Mixed hyperlipidemia: Secondary | ICD-10-CM

## 2020-01-09 NOTE — Telephone Encounter (Signed)
Sent  Patient mychart message -@ [facility] an appointment and labs  Mailed  labslip Mrs Giammarino  ,  Dr [name] would like for you to have some lab work  ([place], LIPID PANEL) completed  prior to your next visit with her in July 2021.   I have schedule you an appointment for July 7 , 2021 at 11:20 am. Please arrive 15 minute early  and bring  all medication bottles that you are taking at present time. If this time is not appropriate, you may send a message or call to reschedule date and time suitable for you.    We mail you a labslip  for you to have labs done  a week prior to appointment. Instruction will be attached. You can come to the office - from 8 am -12 noon , 1:45- 4 pm.  Thanks Ivin Booty RN

## 2020-01-09 NOTE — Telephone Encounter (Signed)
-----   Message from [name], MD sent at 12/25/2019  6:52 PM EDT ----- Can we set her up to have CMP and lipids just prior to my next visit with her? Thanks, Smitty Cords

## 2020-02-20 ENCOUNTER — Emergency Department (HOSPITAL_COMMUNITY): Payer: Medicare Other

## 2020-02-20 ENCOUNTER — Encounter (HOSPITAL_COMMUNITY): Payer: Self-pay | Admitting: *Deleted

## 2020-02-20 ENCOUNTER — Emergency Department (HOSPITAL_COMMUNITY)
Admission: EM | Admit: 2020-02-20 | Discharge: 2020-02-20 | Disposition: A | Discharge status: Home / Self Care | Payer: Medicare Other | Attending: Emergency Medicine | Admitting: Emergency Medicine

## 2020-02-20 DIAGNOSIS — R519 Headache, unspecified: Secondary | ICD-10-CM

## 2020-02-20 DIAGNOSIS — R4182 Altered mental status, unspecified: Secondary | ICD-10-CM | POA: Diagnosis present

## 2020-02-20 DIAGNOSIS — R531 Weakness: Secondary | ICD-10-CM | POA: Diagnosis not present

## 2020-02-20 LAB — COMPREHENSIVE METABOLIC PANEL
ALT: 14 U/L (ref 0–44)
AST: 14 U/L — ABNORMAL LOW (ref 15–41)
Albumin: 3.2 g/dL — ABNORMAL LOW (ref 3.5–5.0)
Alkaline Phosphatase: 63 U/L (ref 38–126)
Anion gap: 11 (ref 5–15)
BUN: 18 mg/dL (ref 6–20)
CO2: 23 mmol/L (ref 22–32)
Calcium: 9.8 mg/dL (ref 8.9–10.3)
Chloride: 103 mmol/L (ref 98–111)
Creatinine, Ser: 1.73 mg/dL — ABNORMAL HIGH (ref 0.44–1.00)
GFR calc Af Amer: 37 mL/min — ABNORMAL LOW (ref >60)
GFR calc non Af Amer: 32 mL/min — ABNORMAL LOW (ref >60)
Glucose, Bld: 113 mg/dL — ABNORMAL HIGH (ref 70–99)
Potassium: 4 mmol/L (ref 3.5–5.1)
Sodium: 137 mmol/L (ref 135–145)
Total Bilirubin: 0.7 mg/dL (ref 0.3–1.2)
Total Protein: 6.1 g/dL — ABNORMAL LOW (ref 6.5–8.1)

## 2020-02-20 LAB — URINALYSIS, COMPLETE (UACMP) WITH MICROSCOPIC
Bacteria, UA: NONE SEEN
Bilirubin Urine: NEGATIVE
Glucose, UA: NEGATIVE mg/dL
Ketones, ur: NEGATIVE mg/dL
Nitrite: NEGATIVE
Protein, ur: NEGATIVE mg/dL
Specific Gravity, Urine: 1.002 — ABNORMAL LOW (ref 1.005–1.030)
pH: 6 (ref 5.0–8.0)

## 2020-02-20 LAB — CBC
HCT: 39.8 % (ref 36.0–46.0)
Hemoglobin: 12.6 g/dL (ref 12.0–15.0)
MCH: 33.4 pg (ref 26.0–34.0)
MCHC: 31.7 g/dL (ref 30.0–36.0)
MCV: 105.6 fL — ABNORMAL HIGH (ref 80.0–100.0)
Platelets: 163 10*3/uL (ref 150–400)
RBC: 3.77 MIL/uL — ABNORMAL LOW (ref 3.87–5.11)
RDW: 14.8 % (ref 11.5–15.5)
WBC: 10.1 10*3/uL (ref 4.0–10.5)
nRBC: 0 % (ref 0.0–0.2)

## 2020-02-20 LAB — I-STAT BETA HCG BLOOD, ED (MC, WL, AP ONLY): I-stat hCG, quantitative: 23.2 m[IU]/mL — ABNORMAL HIGH (ref <5)

## 2020-02-20 LAB — TROPONIN I (HIGH SENSITIVITY)
Troponin I (High Sensitivity): 8 ng/L (ref <18)
Troponin I (High Sensitivity): 6 ng/L (ref <18)

## 2020-02-20 LAB — RAPID URINE DRUG SCREEN, HOSP PERFORMED
Amphetamines: NOT DETECTED
Barbiturates: NOT DETECTED
Benzodiazepines: NOT DETECTED
Cocaine: NOT DETECTED
Opiates: NOT DETECTED
Tetrahydrocannabinol: NOT DETECTED

## 2020-02-20 MED ORDER — PROMETHAZINE HCL 25 MG/ML IJ SOLN: 25.0000 mg | Freq: Once | INTRAMUSCULAR | Status: DC

## 2020-02-20 MED ORDER — KETOROLAC TROMETHAMINE 15 MG/ML IJ SOLN: 15.0000 mg | Freq: Once | INTRAMUSCULAR | Status: DC

## 2020-02-20 MED ORDER — LORAZEPAM 1 MG PO TABS: 0.5000 mg | ORAL_TABLET | Freq: Once | ORAL | Status: DC

## 2020-02-20 MED ORDER — SODIUM CHLORIDE 0.9 % IV BOLUS
1000.0000 mL | Freq: Once | INTRAVENOUS | Status: AC
  Administered 2020-02-20: 1000 mL via INTRAVENOUS

## 2020-02-20 MED ORDER — SODIUM CHLORIDE 0.9 % IV SOLN: INTRAVENOUS | Status: DC

## 2020-02-20 MED ORDER — CYCLOBENZAPRINE HCL 10 MG PO TABS
10.0000 mg | ORAL_TABLET | Freq: Two times a day (BID) | ORAL | 0 refills | Status: AC | PRN
Start: 2020-02-20 — End: 2020-02-27

## 2020-02-20 MED ORDER — SODIUM CHLORIDE 0.9% FLUSH
3.0000 mL | Freq: Once | INTRAVENOUS | Status: AC
  Administered 2020-02-20: 3 mL via INTRAVENOUS

## 2020-02-20 NOTE — ED Notes (Signed)
Pt daughter got pt something to eat

## 2020-02-20 NOTE — ED Provider Notes (Signed)
[place] EMERGENCY DEPARTMENT Provider Note   CSN: [id number] Arrival date & time: 02/20/20  1112     History Chief Complaint  Patient presents with  . Altered Mental Status    Valerie [name] is a 59 y.o. female with pertinent past medical history of NSTEMI March 2021, with cardiac cath finding mild nonobstructive coronary disease, OSA, CKD stage III, obesity, diabetes that presents to the ER today for headache and weakness for the past 3 days.  Patient states that headache is behind her head and radiates down into her eyes, also is complaining of blurry vision.  States that the blurry vision has been constant for the past 3 days, denies any current blurry vision.  Denies any eye pain or vision loss.  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  Patient states she takes Plavix and aspirin, has been compliant with these medications.  Patient states that she normally does get headaches, however this 1 feels different.  Is not diabetic or have high blood pressure.  Denies any dizziness.  Does admit to generalized weakness, states that it has been difficult to walk due to the weakness.  States that she did have UTI 4 days ago has been taking Keflex for 4 days.  Denies any new back pain, chest pain, shortness of breath, paresthesias, bowel incontinence.  Patient states that she did have 1 episode of urinary incontinence while she is having her current UTI.  States that she has been eating and drinking normally.  States that she was nauseous 3 days ago, however has not been nauseous since then.  Has not vomited.  Also states that about 3 days ago when headache began she states that she had an episode of aphasia, describes that she was unable to get her words out for about 10 minutes.  Daughter who is present in room states that since her past stroke, she has been having difficulty with  her speech.  Patient states that this new aphasia that lasted for 10 minutes 3 days ago was different.  Denies any other neuro deficits at that time.  Denies any dysphagia, facial droop, paresthesias, focal weakness.  HPI     Past Medical History:  Diagnosis Date  . Anxiety   . Arthritis    Psoriatic  . Depression   . NSTEMI (non-ST elevated myocardial infarction) ([place]) 11/14/2019  . Sleep apnea     Patient Active Problem List   Diagnosis Date Noted  . NSTEMI (non-ST elevated myocardial infarction) ([place]) 11/14/2019  . DM type 2 (diabetes mellitus, type 2) ([place]) 11/14/2019  . Depression   . Anxiety   . Chest pain   . Obesity, Class III, BMI 40-49.9 (morbid obesity) ([place])   . CKD (chronic kidney disease), stage III   . Psoriatic arthritis ([place])   . Tobacco abuse   . OSA (obstructive sleep apnea)   . Neuropathy   . Bipolar disorder [facility])     Past Surgical History:  Procedure Laterality Date  . LEFT HEART CATH AND CORONARY ANGIOGRAPHY N/A 11/14/2019   Procedure: LEFT HEART CATH AND CORONARY ANGIOGRAPHY;  Surgeon: Martinique, Peter M, MD;  Location: [facility];  Service: Cardiovascular;  Laterality: N/A;     OB History   No obstetric history on file.     Family History  Problem Relation Age of Onset  . Breast cancer Neg Hx  Social History   Tobacco Use  . Smoking status: Current Every Day Smoker  . Smokeless tobacco: Never Used  Substance Use Topics  . Alcohol use: Not on file  . Drug use: Not on file    Home Medications Prior to Admission medications   Medication Sig Start Date End Date Taking? Authorizing Provider  ALPRAZolam Duanne Moron) 0.5 MG tablet Take 0.5 mg by mouth 3 (three) times daily as needed for anxiety.  04/18/13   [provider]  Ascorbic Acid (VITAMIN C) 1000 MG tablet Take 1,000 mg by mouth at bedtime.    [provider]  aspirin EC 81 MG tablet Take 1 tablet (81 mg total) by mouth daily. 11/14/19 11/13/20  Samuella Cota, MD  b complex vitamins tablet Take 2 tablets by mouth at bedtime.    [provider]  Cholecalciferol (VITAMIN D-3) 1000 units CAPS Take 1,000 Units by mouth 2 (two) times daily.     [provider]  docusate sodium (COLACE) 100 MG capsule Take 100-200 mg by mouth at bedtime.    [provider]  donepezil (ARICEPT) 10 MG tablet Take 10 mg by mouth daily. 10/30/19   [provider]  escitalopram (LEXAPRO) 20 MG tablet Take 30 mg by mouth daily.  09/10/15   [provider]  etanercept (ENBREL) 50 MG/ML injection Inject 50 mg into the skin every Saturday.    [provider]  fluconazole (DIFLUCAN) 100 MG tablet Take 100 mg by mouth daily. 11/10/19   [provider]  HYDROcodone-acetaminophen (NORCO) 10-325 MG tablet Take 1 tablet by mouth 2 (two) times daily as needed (pain).  07/31/19   [provider]  hydroxychloroquine (PLAQUENIL) 200 MG tablet Take 200 mg by mouth 2 (two) times daily.     [provider]  lamoTRIgine (LAMICTAL) 150 MG tablet Take 75-150 mg by mouth See admin instructions. Take 1/2 tablet (75 mg) by mouth every morning and 1 tablet (150 mg) at night 02/27/18   [provider]  levocetirizine (XYZAL) 5 MG tablet Take 5 mg by mouth 2 (two) times daily.  10/26/19   [provider]  methotrexate (RHEUMATREX) 2.5 MG tablet Take 10 mg by mouth every Monday. 10/30/19   [provider]  nadolol (CORGARD) 20 MG tablet Take 0.5 tablets (10 mg total) by mouth daily. 12/04/19   [name], MD  nitroGLYCERIN (NITROSTAT) 0.4 MG SL tablet Place 1 tablet (0.4 mg total) under the tongue every 5 (five) minutes as needed for chest pain. 11/14/19   Samuella Cota, MD  Olopatadine HCl (PATADAY OP) Place 1 drop into both eyes daily as needed (itching).    [provider]  omeprazole (PRILOSEC) 20 MG capsule Take 20 mg by mouth daily.     [provider]  predniSONE (DELTASONE)  10 MG tablet Take 10 mg by mouth daily.  02/11/13   [provider]  Probiotic CAPS Take 1 capsule by mouth at bedtime.    [provider]  rizatriptan (MAXALT-MLT) 10 MG disintegrating tablet Take 10 mg by mouth See admin instructions. Take one tablet (10 mg) by mouth daily as needed for "floaters", May repeat in 2 hours if still needed    [provider]  rosuvastatin (CRESTOR) 5 MG tablet Take 1 tablet (5 mg total) by mouth at bedtime. 11/14/19 12/14/19  Samuella Cota, MD  traZODone (DESYREL) 150 MG tablet Take 225 mg by mouth at bedtime. 10/27/19   [provider]    Allergies    Carbamazepine and Sertraline hcl  Review of Systems   Review of Systems  Constitutional: Positive for fatigue. Negative for chills, diaphoresis and fever.  HENT: Negative for congestion, sore throat and trouble swallowing.   Eyes: Positive for visual disturbance. Negative for photophobia, pain, redness and itching.  Respiratory: Negative for cough, shortness of breath and wheezing.   Cardiovascular: Negative for chest pain, palpitations and leg swelling.  Gastrointestinal: Negative for abdominal distention, abdominal pain, diarrhea, nausea and vomiting.  Genitourinary: Negative for difficulty urinating.  Musculoskeletal: Negative for back pain, neck pain and neck stiffness.  Skin: Negative for pallor.  Neurological: Positive for weakness and headaches. Negative for dizziness, seizures, syncope, facial asymmetry, speech difficulty, light-headedness and numbness.  Psychiatric/Behavioral: Negative for confusion.    Physical Exam Updated Vital Signs BP 123/67   Pulse (!) 54   Temp 98.3 F (36.8 C) (Oral)   Resp (!) 23   Ht 5\' 2"  (1.575 m)   Wt 104.3 kg   SpO2 94%   BMI 42.07 kg/m   Physical Exam Constitutional:      General: She is not in acute distress.    Appearance: Normal appearance. She is not ill-appearing, toxic-appearing or diaphoretic.  HENT:      Mouth/Throat:     Mouth: Mucous membranes are moist.     Pharynx: Oropharynx is clear.  Eyes:     General: No scleral icterus.    Extraocular Movements: Extraocular movements intact.     Pupils: Pupils are equal, round, and reactive to light.  Cardiovascular:     Rate and Rhythm: Normal rate and regular rhythm.     Pulses: Normal pulses.     Heart sounds: Normal heart sounds.  Pulmonary:     Effort: Pulmonary effort is normal. No respiratory distress.     Breath sounds: Normal breath sounds. No stridor. No wheezing, rhonchi or rales.  Chest:     Chest wall: No tenderness.  Abdominal:     General: Abdomen is flat. There is no distension.     Palpations: Abdomen is soft.     Tenderness: There is no abdominal tenderness. There is no guarding or rebound.  Musculoskeletal:        General: No swelling or tenderness. Normal range of motion.     Cervical back: Normal range of motion and neck supple. No rigidity.     Right lower leg: No edema.     Left lower leg: No edema.  Skin:    General: Skin is warm and dry.     Capillary Refill: Capillary refill takes less than 2 seconds.     Coloration: Skin is not pale.  Neurological:     General: No focal deficit present.     Mental Status: She is alert and oriented to person, place, and time. Mental status is at baseline.     Cranial Nerves: No cranial nerve deficit.     Sensory: No sensory deficit.     Motor: Weakness present.     Coordination: Coordination normal.     Gait: Gait normal.     Comments: Patient with generalized weakness, states that it is difficult for her to sit up out of bed due to her weakness.  Patient with normal strength to her upper and lower bilateral extremities.  Psychiatric:        Mood and Affect: Mood normal.        Behavior: Behavior normal.     ED  Results / Procedures / Treatments   Labs (all labs ordered are listed, but only abnormal results are displayed) Labs Reviewed  COMPREHENSIVE METABOLIC PANEL -  Abnormal; Notable for the following components:      Result Value   Glucose, Bld 113 (*)    Creatinine, Ser 1.73 (*)    Total Protein 6.1 (*)    Albumin 3.2 (*)    AST 14 (*)    GFR calc non Af Amer 32 (*)    GFR calc Af Amer 37 (*)    All other components within normal limits  CBC - Abnormal; Notable for the following components:   RBC 3.77 (*)    MCV 105.6 (*)    All other components within normal limits  I-STAT BETA HCG BLOOD, ED ([facility], [facility], [facility] ONLY) - Abnormal; Notable for the following components:   I-stat hCG, quantitative 23.2 (*)    All other components within normal limits  URINALYSIS, COMPLETE (UACMP) WITH MICROSCOPIC  RAPID URINE DRUG SCREEN, HOSP PERFORMED  TROPONIN I (HIGH SENSITIVITY)  TROPONIN I (HIGH SENSITIVITY)    EKG EKG Interpretation  Date/Time:  Tuesday February 20 2020 11:20:11 EDT Ventricular Rate:  69 PR Interval:  198 QRS Duration: 74 QT Interval:  364 QTC Calculation: 390 R Axis:   -3 Text Interpretation: Normal sinus rhythm Possible Inferior infarct , age undetermined Anterior infarct , age undetermined Abnormal ECG No significant change since last tracing Confirmed by Wandra Arthurs [phone]) on 02/20/2020 6:14:39 PM   Radiology No results found.  Procedures Procedures (including critical care time)  Medications Ordered in ED Medications  sodium chloride 0.9 % bolus 1,000 mL (0 mLs Intravenous Stopped 02/20/20 2056)    And  0.9 %  sodium chloride infusion ( Intravenous New Bag/Given 02/20/20 2056)  promethazine (PHENERGAN) injection 25 mg (has no administration in time range)  ketorolac (TORADOL) 15 MG/ML injection 15 mg (has no administration in time range)  sodium chloride flush (NS) 0.9 % injection 3 mL (3 mLs Intravenous Given 02/20/20 1931)    ED Course  I have reviewed the triage vital signs and the nursing notes.  Pertinent labs & imaging results that were available during my care of the patient were reviewed by me and considered in my  medical decision making (see chart for details).  Clinical Course as of Feb 20 1442  Tue Feb 20, 2020  2254 Patient with previous history of MI and stroke presenting to the emergency department for 3 days of occipital left-sided headache with associated occasional aphasia, blurry vision and feeling off balance.  Her neurological exam is normal, vision intact.  Her symptoms were improved with Toradol.  She is not orthostatic.  Overall her work-up so far has been benign including a CT scan without acute finding.  However, given her previous history of cerebellar stroke we will order an MRI.  Pending results right now.   [KM]  2344 MRI was normal, patient remained improved.  Patient does note that she sees neurology regularly and she does get occipital injections for headache and neck pain.  I believe that this may be an exacerbation of occipital neuralgia.  We will treat her pain and she will follow-up with neurology.  Advised on return precautions.   [KM]    Clinical Course User Index [KM] Kristine Royal   MDM Rules/Calculators/A&P  Esme Durkin is a 59 y.o. female with pertinent past medical history of NSTEMI March 2021, with cardiac cath finding mild nonobstructive coronary disease, OSA, CKD stage III, obesity, diabetes that presents to the ER today for headache and weakness for the past 3 days.  Patient with previous history of stroke about 3 months ago, patient is compliant with anticoagulation.Patient with current vitals blood pressure 90/49, will give fluids at this time.  Patient does not want anything for pain currently states that headache has greatly improved.  Wants to wait for CT to result before pain medication.  Is not currently nauseous.  Normal visual acuity.  CBC and CMP stable.  Creatinine at baseline.  Hemoglobin also baseline.  2 negative  Troponins. Awaiting CT. awaiting urinalysis and UDS.  I-STAT beta hemoglobin 23.2, I think this is a false  positive since patient is 59.  Pt care was handed off to [facility] at 900.  Complete history and physical and current plan have been communicated.  Please refer to their note for the remainder of ED care and ultimate disposition.  Dispo depending on CT head.  If CT head normal we will most likely treat with migraine cocktail to see if there is any improvement, touch base with neurology since patient does have previous history of stroke.  Also ambulate patient to observe weakness.  Final Clinical Impression(s) / ED Diagnoses Final diagnoses:  Acute intractable headache, unspecified headache type  Weakness    Rx / DC Orders ED Discharge Orders    None       Alfredia Client, PA-C 02/21/20 1447    Drenda Freeze, MD 02/21/20 1500

## 2020-02-20 NOTE — ED Notes (Signed)
Left eye 20/50 ; Right eye 20/50

## 2020-02-20 NOTE — ED Provider Notes (Signed)
[place] EMERGENCY DEPARTMENT Provider Note   CSN: [id number] Arrival date & time: 02/20/20  1112     History Chief Complaint  Patient presents with  . Altered Mental Status    Valerie [name] is a 59 y.o. female.  HPI     Past Medical History:  Diagnosis Date  . Anxiety   . Arthritis    Psoriatic  . Depression   . NSTEMI (non-ST elevated myocardial infarction) ([place]) 11/14/2019  . Sleep apnea     Patient Active Problem List   Diagnosis Date Noted  . NSTEMI (non-ST elevated myocardial infarction) ([place]) 11/14/2019  . DM type 2 (diabetes mellitus, type 2) ([place]) 11/14/2019  . Depression   . Anxiety   . Chest pain   . Obesity, Class III, BMI 40-49.9 (morbid obesity) ([place])   . CKD (chronic kidney disease), stage III   . Psoriatic arthritis ([place])   . Tobacco abuse   . OSA (obstructive sleep apnea)   . Neuropathy   . Bipolar disorder [facility])     Past Surgical History:  Procedure Laterality Date  . LEFT HEART CATH AND CORONARY ANGIOGRAPHY N/A 11/14/2019   Procedure: LEFT HEART CATH AND CORONARY ANGIOGRAPHY;  Surgeon: Martinique, Peter M, MD;  Location: [place] CV LAB;  Service: Cardiovascular;  Laterality: N/A;     OB History   No obstetric history on file.     Family History  Problem Relation Age of Onset  . Breast cancer Neg Hx     Social History   Tobacco Use  . Smoking status: Current Every Day Smoker  . Smokeless tobacco: Never Used  Substance Use Topics  . Alcohol use: Not on file  . Drug use: Not on file    Home Medications Prior to Admission medications   Medication Sig Start Date End Date Taking? Authorizing Provider  ALPRAZolam Duanne Moron) 0.5 MG tablet Take 0.5 mg by mouth 3 (three) times daily as needed for anxiety.  04/18/13   [provider]  Ascorbic Acid (VITAMIN C) 1000 MG tablet Take 1,000 mg by mouth at bedtime.    [provider]  aspirin EC 81 MG tablet Take 1 tablet (81 mg total) by mouth daily.  11/14/19 11/13/20  Samuella Cota, MD  b complex vitamins tablet Take 2 tablets by mouth at bedtime.    [provider]  Cholecalciferol (VITAMIN D-3) 1000 units CAPS Take 1,000 Units by mouth 2 (two) times daily.     [provider]  cyclobenzaprine (FLEXERIL) 10 MG tablet Take 1 tablet (10 mg total) by mouth 2 (two) times daily as needed for up to 7 days for muscle spasms. 02/20/20 02/27/20  Madilyn Hook A, PA-C  docusate sodium (COLACE) 100 MG capsule Take 100-200 mg by mouth at bedtime.    [provider]  donepezil (ARICEPT) 10 MG tablet Take 10 mg by mouth daily. 10/30/19   [provider]  escitalopram (LEXAPRO) 20 MG tablet Take 30 mg by mouth daily.  09/10/15   [provider]  etanercept (ENBREL) 50 MG/ML injection Inject 50 mg into the skin every Saturday.    [provider]  fluconazole (DIFLUCAN) 100 MG tablet Take 100 mg by mouth daily. 11/10/19   [provider]  HYDROcodone-acetaminophen (NORCO) 10-325 MG tablet Take 1 tablet by mouth 2 (two) times daily as needed (pain).  07/31/19   [provider]  hydroxychloroquine (PLAQUENIL) 200 MG tablet Take 200 mg by mouth 2 (two) times daily.  [provider]  lamoTRIgine (LAMICTAL) 150 MG tablet Take 75-150 mg by mouth See admin instructions. Take 1/2 tablet (75 mg) by mouth every morning and 1 tablet (150 mg) at night 02/27/18   [provider]  levocetirizine (XYZAL) 5 MG tablet Take 5 mg by mouth 2 (two) times daily.  10/26/19   [provider]  methotrexate (RHEUMATREX) 2.5 MG tablet Take 10 mg by mouth every Monday. 10/30/19   [provider]  nadolol (CORGARD) 20 MG tablet Take 0.5 tablets (10 mg total) by mouth daily. 12/04/19   [name], MD  nitroGLYCERIN (NITROSTAT) 0.4 MG SL tablet Place 1 tablet (0.4 mg total) under the tongue every 5 (five) minutes as needed for chest pain. 11/14/19   Samuella Cota, MD  Olopatadine  HCl (PATADAY OP) Place 1 drop into both eyes daily as needed (itching).    [provider]  omeprazole (PRILOSEC) 20 MG capsule Take 20 mg by mouth daily.     [provider]  predniSONE (DELTASONE) 10 MG tablet Take 10 mg by mouth daily.  02/11/13   [provider]  Probiotic CAPS Take 1 capsule by mouth at bedtime.    [provider]  rizatriptan (MAXALT-MLT) 10 MG disintegrating tablet Take 10 mg by mouth See admin instructions. Take one tablet (10 mg) by mouth daily as needed for "floaters", May repeat in 2 hours if still needed    [provider]  rosuvastatin (CRESTOR) 5 MG tablet Take 1 tablet (5 mg total) by mouth at bedtime. 11/14/19 12/14/19  Samuella Cota, MD  traZODone (DESYREL) 150 MG tablet Take 225 mg by mouth at bedtime. 10/27/19   [provider]    Allergies    Carbamazepine and Sertraline hcl  Review of Systems   Review of Systems  Physical Exam Updated Vital Signs BP 124/68 (BP Location: Left Arm)   Pulse 60   Temp 98.3 F (36.8 C) (Oral)   Resp 20   Ht 5\' 2"  (1.575 m)   Wt 104.3 kg   SpO2 100%   BMI 42.07 kg/m   Physical Exam Vitals and nursing note reviewed.  Constitutional:      General: She is not in acute distress.    Appearance: Normal appearance. She is not ill-appearing, toxic-appearing or diaphoretic.  HENT:     Head: Normocephalic.  Eyes:     Conjunctiva/sclera: Conjunctivae normal.  Pulmonary:     Effort: Pulmonary effort is normal.  Skin:    General: Skin is dry.  Neurological:     General: No focal deficit present.     Mental Status: She is alert and oriented to person, place, and time.     Cranial Nerves: No cranial nerve deficit.     Sensory: No sensory deficit.     Motor: No weakness.     Coordination: Coordination normal.  Psychiatric:        Mood and Affect: Mood normal.     ED Results / Procedures / Treatments   Labs (all labs ordered are listed, but only abnormal  results are displayed) Labs Reviewed  COMPREHENSIVE METABOLIC PANEL - Abnormal; Notable for the following components:      Result Value   Glucose, Bld 113 (*)    Creatinine, Ser 1.73 (*)    Total Protein 6.1 (*)    Albumin 3.2 (*)    AST 14 (*)    GFR calc non Af Amer 32 (*)    GFR calc  Af Amer 37 (*)    All other components within normal limits  CBC - Abnormal; Notable for the following components:   RBC 3.77 (*)    MCV 105.6 (*)    All other components within normal limits  URINALYSIS, COMPLETE (UACMP) WITH MICROSCOPIC - Abnormal; Notable for the following components:   Color, Urine STRAW (*)    Specific Gravity, Urine 1.002 (*)    Hgb urine dipstick SMALL (*)    Leukocytes,Ua MODERATE (*)    All other components within normal limits  I-STAT BETA HCG BLOOD, ED (MC, WL, AP ONLY) - Abnormal; Notable for the following components:   I-stat hCG, quantitative 23.2 (*)    All other components within normal limits  URINE CULTURE  RAPID URINE DRUG SCREEN, HOSP PERFORMED  TROPONIN I (HIGH SENSITIVITY)  TROPONIN I (HIGH SENSITIVITY)    EKG EKG Interpretation  Date/Time:  Tuesday February 20 2020 11:20:11 EDT Ventricular Rate:  69 PR Interval:  198 QRS Duration: 74 QT Interval:  364 QTC Calculation: 390 R Axis:   -3 Text Interpretation: Normal sinus rhythm Possible Inferior infarct , age undetermined Anterior infarct , age undetermined Abnormal ECG No significant change since last tracing Confirmed by Wandra Arthurs [phone]) on 02/20/2020 6:14:39 PM   Radiology CT HEAD WO CONTRAST  Result Date: 02/20/2020 CLINICAL DATA:  Headache, acute, normal neuro exam. Additional provided: Confusion and weakness for the past 4 days, headache for past 3 days. EXAM: CT HEAD WITHOUT CONTRAST TECHNIQUE: Contiguous axial images were obtained from the base of the skull through the vertex without intravenous contrast. COMPARISON:  No pertinent prior studies available for comparison. FINDINGS: Brain:  Cerebral volume is normal for age. There is no acute intracranial hemorrhage. No demarcated cortical infarct. No extra-axial fluid collection. No evidence of intracranial mass. No midline shift. Partially empty sella turcica. Vascular: No hyperdense vessel. Skull: Normal. Negative for fracture or focal lesion. Sinuses/Orbits: Visualized orbits show no acute finding. Partially imaged right maxillary sinus mucous retention cyst. No significant mastoid effusion. IMPRESSION: Partially empty sella turcica. This is very commonly an incidental finding, but can be associated with idiopathic intracranial hypertension. Otherwise unremarkable non-contrast CT appearance of the brain for age. Right maxillary sinus mucous retention cyst. Electronically Signed   By: Kellie Simmering DO   On: 02/20/2020 20:41   MR BRAIN WO CONTRAST  Result Date: 02/20/2020 CLINICAL DATA:  Initial evaluation for acute speech difficulty. EXAM: MRI HEAD WITHOUT CONTRAST TECHNIQUE: Multiplanar, multiecho pulse sequences of the brain and surrounding structures were obtained without intravenous contrast. COMPARISON:  Prior CT from earlier the same day. FINDINGS: Brain: Examination mildly degraded by motion artifact. Cerebral volume within normal limits for patient age. No focal parenchymal signal abnormality identified. No abnormal foci of restricted diffusion to suggest acute or subacute ischemia. Gray-white matter differentiation well maintained. No encephalomalacia to suggest chronic infarction. No foci of susceptibility artifact to suggest acute or chronic intracranial hemorrhage. No mass lesion, midline shift or mass effect. No hydrocephalus. No extra-axial fluid collection. Major dural sinuses are grossly patent. Incidental note made of a partially empty sella. Midline structures intact and normal. Vascular: Major intracranial vascular flow voids well maintained and normal in appearance. Skull and upper cervical spine: Craniocervical junction  normal. Visualized upper cervical spine within normal limits. Bone marrow signal intensity normal. No scalp soft tissue abnormality. Sinuses/Orbits: Patient status post bilateral ocular lens replacement. Globes and orbital soft tissues demonstrate no acute finding. Tiny right maxillary sinus retention cyst noted.  Paranasal sinuses are otherwise clear. No mastoid effusion. Inner ear structures grossly within normal limits. Other: None. IMPRESSION: Normal brain MRI for age. No acute intracranial infarct or other abnormality. Electronically Signed   By: Jeannine Boga M.D.   On: 02/20/2020 23:38    Procedures Procedures (including critical care time)  Medications Ordered in ED Medications  sodium chloride 0.9 % bolus 1,000 mL (0 mLs Intravenous Stopped 02/20/20 2056)    And  0.9 %  sodium chloride infusion ( Intravenous New Bag/Given 02/20/20 2056)  promethazine (PHENERGAN) injection 25 mg (25 mg Intravenous Refused 02/20/20 2340)  LORazepam (ATIVAN) tablet 0.5 mg (0.5 mg Oral Refused 02/20/20 2340)  sodium chloride flush (NS) 0.9 % injection 3 mL (3 mLs Intravenous Given 02/20/20 1931)    ED Course  I have reviewed the triage vital signs and the nursing notes.  Pertinent labs & imaging results that were available during my care of the patient were reviewed by me and considered in my medical decision making (see chart for details).  Clinical Course as of Feb 20 2344  Tue Feb 20, 2020  2254 Patient with previous history of MI and stroke presenting to the emergency department for 3 days of occipital left-sided headache with associated occasional aphasia, blurry vision and feeling off balance.  Her neurological exam is normal, vision intact.  Her symptoms were improved with Toradol.  She is not orthostatic.  Overall her work-up so far has been benign including a CT scan without acute finding.  However, given her previous history of cerebellar stroke we will order an MRI.  Pending results right now.    [KM]  2344 MRI was normal, patient remained improved.  Patient does note that she sees neurology regularly and she does get occipital injections for headache and neck pain.  I believe that this may be an exacerbation of occipital neuralgia.  We will treat her pain and she will follow-up with neurology.  Advised on return precautions.   [KM]    Clinical Course User Index [KM] Kristine Royal   MDM Rules/Calculators/A&P                          Based on review of vitals, medical screening exam, lab work and/or imaging, there does not appear to be an acute, emergent etiology for the patient's symptoms. Counseled pt on good return precautions and encouraged both PCP and ED follow-up as needed.  Prior to discharge, I also discussed incidental imaging findings with patient in detail and advised appropriate, recommended follow-up in detail.  Clinical Impression: 1. Weakness   2. Occipital headache     Disposition: Discharge  Prior to providing a prescription for a controlled substance, I independently reviewed the patient's recent prescription history on the [place]. The patient had no recent or regular prescriptions and was deemed appropriate for a brief, less than 3 day prescription of narcotic for acute analgesia.  This note was prepared with assistance of Systems analyst. Occasional wrong-word or sound-a-like substitutions may have occurred due to the inherent limitations of voice recognition software.  Final Clinical Impression(s) / ED Diagnoses Final diagnoses:  Weakness  Occipital headache    Rx / DC Orders ED Discharge Orders         Ordered    cyclobenzaprine (FLEXERIL) 10 MG tablet  2 times daily PRN     Discontinue  Reprint     02/20/20 2344  Alveria Apley, PA-C 02/20/20 2345    Drenda Freeze, MD 02/21/20 1501

## 2020-02-20 NOTE — Discharge Instructions (Addendum)
Your workup today was reassuring. Your MRI was normal and your labs were reassuring. I believe your symptoms may be related to occipital headaches/neuralgia and muscle tension. We will try a muscle relaxant. Please see your neurologist soon. Thank you for allowing me to care for you today. Please return to the emergency department if you have new or worsening symptoms. Take your medications as instructed.

## 2020-02-20 NOTE — ED Triage Notes (Addendum)
To ED for eval of feeling confused and weak for the past 4 days. On abx for uti - today is her last dose. Today feels tired and generally weak. No neuro deficits noted. Speech is clear. Complains of HA for past 3 days. Appears in nad. MI 3/21

## 2020-02-28 ENCOUNTER — Ambulatory Visit: Payer: Medicare Other | Admitting: Internal Medicine

## 2020-03-14 ENCOUNTER — Telehealth: Payer: Self-pay | Admitting: *Deleted

## 2020-03-14 NOTE — Telephone Encounter (Signed)
Called left detail message - reminder to do labwork CMP LIPID prior to 03/21/20 virtual visit   Instruction left for how to get lab work completed.

## 2020-03-18 ENCOUNTER — Telehealth: Payer: Self-pay | Admitting: Internal Medicine

## 2020-03-18 NOTE — Telephone Encounter (Signed)
Returned call to patient, she states she had labs drawn in the ER.   Advised she did have CMET but did not have lipid panel.  She request to hold off on this right now as she has been stuck a lot in last few weeks.  She is also wondering if she can just follow with her PCP, she is seeing a lot of doctors and has a lot of co-pays.  Advised to keep VV and discuss with Dr.Acharya.  Patient verbalized understanding.

## 2020-03-18 NOTE — Telephone Encounter (Signed)
Patient called and stated that she has already done the [place] and LIPID PANEL when she was in the ER. States that she did fast because she was in the ER for 11 hrs. Please call to discuss with patient.

## 2020-03-21 ENCOUNTER — Telehealth (INDEPENDENT_AMBULATORY_CARE_PROVIDER_SITE_OTHER): Payer: Medicare Other | Admitting: Internal Medicine

## 2020-03-21 VITALS — Ht 62.0 in | Wt 215.0 lb

## 2020-03-21 DIAGNOSIS — I10 Essential (primary) hypertension: Secondary | ICD-10-CM | POA: Diagnosis not present

## 2020-03-21 DIAGNOSIS — E782 Mixed hyperlipidemia: Secondary | ICD-10-CM | POA: Diagnosis not present

## 2020-03-21 DIAGNOSIS — E1140 Type 2 diabetes mellitus with diabetic neuropathy, unspecified: Secondary | ICD-10-CM | POA: Diagnosis not present

## 2020-03-21 DIAGNOSIS — I214 Non-ST elevation (NSTEMI) myocardial infarction: Secondary | ICD-10-CM

## 2020-03-21 DIAGNOSIS — Z79899 Other long term (current) drug therapy: Secondary | ICD-10-CM

## 2020-03-21 DIAGNOSIS — G4733 Obstructive sleep apnea (adult) (pediatric): Secondary | ICD-10-CM

## 2020-03-21 MED ORDER — ROSUVASTATIN CALCIUM 5 MG PO TABS
5.0000 mg | ORAL_TABLET | Freq: Every day | ORAL | 3 refills | Status: DC
Start: 2020-03-21 — End: 2021-01-10

## 2020-03-21 NOTE — Progress Notes (Signed)
Virtual Visit via Telephone Note   This visit type was conducted due to national recommendations for restrictions regarding the COVID-19 Pandemic (e.g. social distancing) in an effort to limit this patient's exposure and mitigate transmission in our community.  Due to her co-morbid illnesses, this patient is at least at moderate risk for complications without adequate follow up.  This format is felt to be most appropriate for this patient at this time.  The patient did not have access to video technology/had technical difficulties with video requiring transitioning to audio format only (telephone).  All issues noted in this document were discussed and addressed.  No physical exam could be performed with this format.  Please refer to the patient's chart for her  consent to telehealth for [facility].    Date:  03/21/2020   ID:  Valerie [name], DOB 10/30/1960, MRN [id number] The patient was identified using 2 identifiers.  Patient Location: Home Provider Location: Home Office  PCP:  System, Pcp Not In  Cardiologist:  [name], MD  Electrophysiologist:  None   Evaluation Performed:  Follow-Up Visit  Chief Complaint:  Follow up nstemi/minoca  History of Present Illness:    Valerie [name] is a 59 y.o. female with diabetes mellitus with peripheral neuropathy, morbid obesity presented with chest pain and found to have elevated troponin. Underwent coronary angiography which demonstrated nonobstructive disease. LVEDP 25 mmHg on cath. NSTEMI secondary to [facility], etiology may include HTN heart disease or small vessel disease in the setting of diabetes.    Recently had ED visit for weakness and AMS. She is doing better now. No CP or SOB.  The patient does not have symptoms concerning for COVID-19 infection (fever, chills, cough, or new shortness of breath).    Past Medical History:  Diagnosis Date   Anxiety    Arthritis    Psoriatic   Bipolar disorder ([place])    CKD (chronic  kidney disease), stage III    Depression    DM type 2 (diabetes mellitus, type 2) ([place]) 11/14/2019   Neuropathy    NSTEMI (non-ST elevated myocardial infarction) ([place]) 11/14/2019   OSA (obstructive sleep apnea)    Psoriatic arthritis ([place])    Sleep apnea    Past Surgical History:  Procedure Laterality Date   LEFT HEART CATH AND CORONARY ANGIOGRAPHY N/A 11/14/2019   Procedure: LEFT HEART CATH AND CORONARY ANGIOGRAPHY;  Surgeon: Martinique, Peter M, MD;  Location: [place] [facility] LAB;  Service: Cardiovascular;  Laterality: N/A;     Current Meds  Medication Sig   ALPRAZolam (XANAX) 0.5 MG tablet Take 0.5 mg by mouth 3 (three) times daily as needed for anxiety.    Ascorbic Acid (VITAMIN C) 1000 MG tablet Take 1,000 mg by mouth at bedtime.   aspirin EC 81 MG tablet Take 1 tablet (81 mg total) by mouth daily.   b complex vitamins tablet Take 2 tablets by mouth at bedtime.   Cholecalciferol (VITAMIN D-3) 1000 units CAPS Take 1,000 Units by mouth 2 (two) times daily.    clopidogrel (PLAVIX) 75 MG tablet Take 75 mg by mouth daily.   docusate sodium (COLACE) 100 MG capsule Take 100-200 mg by mouth at bedtime.   donepezil (ARICEPT) 10 MG tablet Take 10 mg by mouth daily.   escitalopram (LEXAPRO) 20 MG tablet Take 30 mg by mouth daily.    etanercept (ENBREL) 50 MG/ML injection Inject 50 mg into the skin every Saturday.   HYDROcodone-acetaminophen (NORCO) 10-325 MG tablet Take 1 tablet  by mouth 2 (two) times daily as needed (pain).    hydroxychloroquine (PLAQUENIL) 200 MG tablet Take 200 mg by mouth daily.    lamoTRIgine (LAMICTAL) 150 MG tablet Take 75-150 mg by mouth See admin instructions. Take 1/2 tablet (75 mg) by mouth every morning and 1 tablet (150 mg) at night   methotrexate (RHEUMATREX) 2.5 MG tablet Take 10 mg by mouth every Monday.   nadolol (CORGARD) 20 MG tablet Take 0.5 tablets (10 mg total) by mouth daily.   nitroGLYCERIN (NITROSTAT) 0.4 MG SL tablet Place 1 tablet  (0.4 mg total) under the tongue every 5 (five) minutes as needed for chest pain.   Olopatadine HCl (PATADAY OP) Place 1 drop into both eyes daily as needed (itching).   omeprazole (PRILOSEC) 20 MG capsule Take 20 mg by mouth daily.    predniSONE (DELTASONE) 10 MG tablet Take 10 mg by mouth daily.    rizatriptan (MAXALT-MLT) 10 MG disintegrating tablet Take 10 mg by mouth See admin instructions. Take one tablet (10 mg) by mouth daily as needed for "floaters", May repeat in 2 hours if still needed   traZODone (DESYREL) 150 MG tablet Take 200 mg by mouth at bedtime.      Allergies:   Carbamazepine and Sertraline hcl   Social History   Tobacco Use   Smoking status: Current Every Day Smoker   Smokeless tobacco: Never Used  Substance Use Topics   Alcohol use: Not on file   Drug use: Not on file     Family Hx: The patient's family history is negative for Breast cancer.  ROS:   Please see the history of present illness.     All other systems reviewed and are negative.   Prior CV studies:   The following studies were reviewed today:    Labs/Other Tests and Data Reviewed:    EKG:  An ECG dated 02/20/20 was personally reviewed today and demonstrated:  NSR, inferior infarct pattern  Recent Labs: 11/13/2019: Magnesium 2.2 02/20/2020: ALT 14; BUN 18; Creatinine, Ser 1.73; Hemoglobin 12.6; Platelets 163; Potassium 4.0; Sodium 137   Recent Lipid Panel Lab Results  Component Value Date/Time   CHOL 210 (H) 11/14/2019 04:58 PM   TRIG 202 (H) 11/14/2019 04:58 PM   HDL 37 (L) 11/14/2019 04:58 PM   CHOLHDL 5.7 11/14/2019 04:58 PM   LDLCALC 133 (H) 11/14/2019 04:58 PM    Wt Readings from Last 3 Encounters:  03/21/20 (!) 215 lb (97.5 kg)  02/20/20 230 lb (104.3 kg)  12/04/19 230 lb (104.3 kg)     Objective:    Vital Signs:  Ht 5\' 2"  (1.575 m)    Wt (!) 215 lb (97.5 kg)    BMI 39.32 kg/m    VITAL SIGNS:  reviewed GEN:  no acute distress RESPIRATORY:  normal respiratory  effort, no increased work of breathing NEURO:  alert and oriented x 3, speech normal PSYCH:  normal affect   ASSESSMENT & PLAN:    1. Non-ST elevation (NSTEMI) myocardial infarction ([place])   2. Mixed hyperlipidemia   3. Essential hypertension   4. Type 2 diabetes mellitus with diabetic neuropathy, without long-term current use of insulin ([place])   5. OSA (obstructive sleep apnea)   6. Medication management     Non-ST elevation (NSTEMI) myocardial infarction (HCC) -  Continue ASA, plavix, BB, statin. Will refill Crestor 5 mg daily.   Mixed hyperlipidemia - continue crestor, needs follow up lipids or with PCP. Would like to wait on lipids  due to recent sticks.   Essential hypertension - adequate control. Takes nadolol per preference, 10 mg daily.   Type 2 diabetes mellitus with diabetic neuropathy, without long-term current use of insulin (HCC) - per PCP. Last A1C 5.8 in March 21.    OSA (obstructive sleep apnea) - encouraged CPAP.    COVID-19 Education: The signs and symptoms of COVID-19 were discussed with the patient and how to seek care for testing (follow up with PCP or arrange E-visit).  The importance of social distancing was discussed today.  Time:   Today, I have spent 15 minutes with the patient with telehealth technology discussing the above problems.     Medication Adjustments/Labs and Tests Ordered: Current medicines are reviewed at length with the patient today.  Concerns regarding medicines are outlined above.   Patient Instructions  Medication InsCtructions:  Rosuvastatin 5 mg once daily has been sent in for you.  *If you need a refill on your cardiac medications before your next appointment, please call your pharmacy*   Lab Work: None ordered If you have labs (blood work) drawn today and your tests are completely normal, you will receive your results only by:  [place] (if you have MyChart) OR  A paper copy in the mail If you have any lab test that  is abnormal or we need to change your treatment, we will call you to review the results.   Testing/Procedures: None ordered   Follow-Up: At [facility], you and your health needs are our priority.  As part of our continuing mission to provide you with exceptional heart care, we have created designated Provider Care Teams.  These Care Teams include your primary Cardiologist (physician) and Advanced Practice Providers (APPs -  Physician Assistants and Nurse Practitioners) who all work together to provide you with the care you need, when you need it.  We recommend signing up for the patient portal called "MyChart".  Sign up information is provided on this After Visit Summary.  MyChart is used to connect with patients for Virtual Visits (Telemedicine).  Patients are able to view lab/test results, encounter notes, upcoming appointments, etc.  Non-urgent messages can be sent to your provider as well.   To learn more about what you can do with MyChart, go to [email or web address].    Your next appointment:   8 month(s)  The format for your next appointment:   In Person  Provider:   You may see [name], MD or one of the following Advanced Practice Providers on your designated Care Team:    Rosaria Ferries, PA-C  Jory Sims, DNP, ANP  Cadence Kathlen Mody, PA-C       Signed, [name], MD  03/21/2020 9:43 AM    [place]

## 2020-03-21 NOTE — Patient Instructions (Signed)
Medication InsCtructions:  Rosuvastatin 5 mg once daily has been sent in for you.  *If you need a refill on your cardiac medications before your next appointment, please call your pharmacy*   Lab Work: None ordered If you have labs (blood work) drawn today and your tests are completely normal, you will receive your results only by: Marland Kitchen MyChart Message (if you have MyChart) OR . A paper copy in the mail If you have any lab test that is abnormal or we need to change your treatment, we will call you to review the results.   Testing/Procedures: None ordered   Follow-Up: At [facility], you and your health needs are our priority.  As part of our continuing mission to provide you with exceptional heart care, we have created designated Provider Care Teams.  These Care Teams include your primary Cardiologist (physician) and Advanced Practice Providers (APPs -  Physician Assistants and Nurse Practitioners) who all work together to provide you with the care you need, when you need it.  We recommend signing up for the patient portal called "MyChart".  Sign up information is provided on this After Visit Summary.  MyChart is used to connect with patients for Virtual Visits (Telemedicine).  Patients are able to view lab/test results, encounter notes, upcoming appointments, etc.  Non-urgent messages can be sent to your provider as well.   To learn more about what you can do with MyChart, go to [email or web address].    Your next appointment:   8 month(s)  The format for your next appointment:   In Person  Provider:   You may see [name], MD or one of the following Advanced Practice Providers on your designated Care Team:    Rosaria Ferries, PA-C  Jory Sims, DNP, ANP  Cadence Kathlen Mody, PA-C

## 2020-04-30 ENCOUNTER — Other Ambulatory Visit: Payer: Self-pay | Admitting: Obstetrics and Gynecology

## 2020-04-30 DIAGNOSIS — Z1231 Encounter for screening mammogram for malignant neoplasm of breast: Secondary | ICD-10-CM

## 2020-05-10 ENCOUNTER — Ambulatory Visit: Payer: Medicare Other

## 2020-05-13 ENCOUNTER — Telehealth: Payer: Self-pay

## 2020-05-13 DIAGNOSIS — E782 Mixed hyperlipidemia: Secondary | ICD-10-CM

## 2020-05-13 NOTE — Telephone Encounter (Signed)
Attempted to call patient in regards to having repeat lipids drawn. Left message for patient to call back.

## 2020-05-14 ENCOUNTER — Emergency Department (HOSPITAL_COMMUNITY): Payer: Medicare Other

## 2020-05-14 ENCOUNTER — Other Ambulatory Visit: Payer: Self-pay

## 2020-05-14 ENCOUNTER — Inpatient Hospital Stay (HOSPITAL_COMMUNITY)
Admission: EM | Admit: 2020-05-14 | Discharge: 2020-05-19 | DRG: 699 | Disposition: A | Discharge status: Home Health | Payer: Medicare Other | Attending: Internal Medicine | Admitting: Internal Medicine

## 2020-05-14 ENCOUNTER — Encounter (HOSPITAL_COMMUNITY): Payer: Self-pay

## 2020-05-14 DIAGNOSIS — E11649 Type 2 diabetes mellitus with hypoglycemia without coma: Secondary | ICD-10-CM | POA: Diagnosis not present

## 2020-05-14 DIAGNOSIS — S8010XA Contusion of unspecified lower leg, initial encounter: Secondary | ICD-10-CM | POA: Diagnosis present

## 2020-05-14 DIAGNOSIS — E669 Obesity, unspecified: Secondary | ICD-10-CM | POA: Diagnosis present

## 2020-05-14 DIAGNOSIS — E1165 Type 2 diabetes mellitus with hyperglycemia: Secondary | ICD-10-CM | POA: Diagnosis present

## 2020-05-14 DIAGNOSIS — F17210 Nicotine dependence, cigarettes, uncomplicated: Secondary | ICD-10-CM | POA: Diagnosis present

## 2020-05-14 DIAGNOSIS — E1140 Type 2 diabetes mellitus with diabetic neuropathy, unspecified: Secondary | ICD-10-CM | POA: Diagnosis present

## 2020-05-14 DIAGNOSIS — E1122 Type 2 diabetes mellitus with diabetic chronic kidney disease: Secondary | ICD-10-CM | POA: Diagnosis present

## 2020-05-14 DIAGNOSIS — Z7982 Long term (current) use of aspirin: Secondary | ICD-10-CM

## 2020-05-14 DIAGNOSIS — R3129 Other microscopic hematuria: Secondary | ICD-10-CM | POA: Diagnosis present

## 2020-05-14 DIAGNOSIS — K529 Noninfective gastroenteritis and colitis, unspecified: Secondary | ICD-10-CM | POA: Diagnosis present

## 2020-05-14 DIAGNOSIS — I2510 Atherosclerotic heart disease of native coronary artery without angina pectoris: Secondary | ICD-10-CM | POA: Diagnosis present

## 2020-05-14 DIAGNOSIS — N3289 Other specified disorders of bladder: Secondary | ICD-10-CM | POA: Diagnosis present

## 2020-05-14 DIAGNOSIS — N390 Urinary tract infection, site not specified: Secondary | ICD-10-CM | POA: Diagnosis present

## 2020-05-14 DIAGNOSIS — F319 Bipolar disorder, unspecified: Secondary | ICD-10-CM | POA: Diagnosis present

## 2020-05-14 DIAGNOSIS — K5732 Diverticulitis of large intestine without perforation or abscess without bleeding: Secondary | ICD-10-CM | POA: Diagnosis present

## 2020-05-14 DIAGNOSIS — M4854XA Collapsed vertebra, not elsewhere classified, thoracic region, initial encounter for fracture: Secondary | ICD-10-CM | POA: Diagnosis present

## 2020-05-14 DIAGNOSIS — R296 Repeated falls: Secondary | ICD-10-CM | POA: Diagnosis present

## 2020-05-14 DIAGNOSIS — N321 Vesicointestinal fistula: Principal | ICD-10-CM | POA: Diagnosis present

## 2020-05-14 DIAGNOSIS — B999 Unspecified infectious disease: Secondary | ICD-10-CM | POA: Diagnosis not present

## 2020-05-14 DIAGNOSIS — F419 Anxiety disorder, unspecified: Secondary | ICD-10-CM | POA: Diagnosis present

## 2020-05-14 DIAGNOSIS — Z79899 Other long term (current) drug therapy: Secondary | ICD-10-CM

## 2020-05-14 DIAGNOSIS — Z7902 Long term (current) use of antithrombotics/antiplatelets: Secondary | ICD-10-CM

## 2020-05-14 DIAGNOSIS — N1832 Chronic kidney disease, stage 3b: Secondary | ICD-10-CM | POA: Diagnosis present

## 2020-05-14 DIAGNOSIS — Z7952 Long term (current) use of systemic steroids: Secondary | ICD-10-CM

## 2020-05-14 DIAGNOSIS — W19XXXA Unspecified fall, initial encounter: Secondary | ICD-10-CM | POA: Diagnosis present

## 2020-05-14 DIAGNOSIS — E1142 Type 2 diabetes mellitus with diabetic polyneuropathy: Secondary | ICD-10-CM | POA: Diagnosis present

## 2020-05-14 DIAGNOSIS — D7589 Other specified diseases of blood and blood-forming organs: Secondary | ICD-10-CM | POA: Diagnosis present

## 2020-05-14 DIAGNOSIS — G4733 Obstructive sleep apnea (adult) (pediatric): Secondary | ICD-10-CM | POA: Diagnosis present

## 2020-05-14 DIAGNOSIS — I252 Old myocardial infarction: Secondary | ICD-10-CM

## 2020-05-14 DIAGNOSIS — L4050 Arthropathic psoriasis, unspecified: Secondary | ICD-10-CM | POA: Diagnosis present

## 2020-05-14 DIAGNOSIS — D849 Immunodeficiency, unspecified: Secondary | ICD-10-CM | POA: Diagnosis present

## 2020-05-14 DIAGNOSIS — E785 Hyperlipidemia, unspecified: Secondary | ICD-10-CM | POA: Diagnosis present

## 2020-05-14 DIAGNOSIS — Z20822 Contact with and (suspected) exposure to covid-19: Secondary | ICD-10-CM | POA: Diagnosis present

## 2020-05-14 DIAGNOSIS — Z6836 Body mass index (BMI) 36.0-36.9, adult: Secondary | ICD-10-CM | POA: Diagnosis not present

## 2020-05-14 DIAGNOSIS — Z888 Allergy status to other drugs, medicaments and biological substances status: Secondary | ICD-10-CM

## 2020-05-14 DIAGNOSIS — Z8673 Personal history of transient ischemic attack (TIA), and cerebral infarction without residual deficits: Secondary | ICD-10-CM

## 2020-05-14 LAB — URINALYSIS, ROUTINE W REFLEX MICROSCOPIC
Bilirubin Urine: NEGATIVE
Glucose, UA: NEGATIVE mg/dL
Ketones, ur: NEGATIVE mg/dL
Nitrite: NEGATIVE
Protein, ur: 100 mg/dL — AB
Specific Gravity, Urine: 1.01 (ref 1.005–1.030)
WBC, UA: >50 WBC/hpf — ABNORMAL HIGH (ref 0–5)
pH: 5 (ref 5.0–8.0)

## 2020-05-14 LAB — COMPREHENSIVE METABOLIC PANEL
ALT: 14 U/L (ref 0–44)
AST: 22 U/L (ref 15–41)
Albumin: 3.7 g/dL (ref 3.5–5.0)
Alkaline Phosphatase: 72 U/L (ref 38–126)
Anion gap: 11 (ref 5–15)
BUN: 16 mg/dL (ref 6–20)
CO2: 30 mmol/L (ref 22–32)
Calcium: 12.9 mg/dL — ABNORMAL HIGH (ref 8.9–10.3)
Chloride: 95 mmol/L — ABNORMAL LOW (ref 98–111)
Creatinine, Ser: 1.86 mg/dL — ABNORMAL HIGH (ref 0.44–1.00)
GFR calc Af Amer: 33 mL/min — ABNORMAL LOW (ref >60)
GFR calc non Af Amer: 29 mL/min — ABNORMAL LOW (ref >60)
Glucose, Bld: 106 mg/dL — ABNORMAL HIGH (ref 70–99)
Potassium: 4.1 mmol/L (ref 3.5–5.1)
Sodium: 136 mmol/L (ref 135–145)
Total Bilirubin: 0.5 mg/dL (ref 0.3–1.2)
Total Protein: 6.4 g/dL — ABNORMAL LOW (ref 6.5–8.1)

## 2020-05-14 LAB — CBC
HCT: 39.4 % (ref 36.0–46.0)
Hemoglobin: 13 g/dL (ref 12.0–15.0)
MCH: 35.2 pg — ABNORMAL HIGH (ref 26.0–34.0)
MCHC: 33 g/dL (ref 30.0–36.0)
MCV: 106.8 fL — ABNORMAL HIGH (ref 80.0–100.0)
Platelets: 261 10*3/uL (ref 150–400)
RBC: 3.69 MIL/uL — ABNORMAL LOW (ref 3.87–5.11)
RDW: 16.6 % — ABNORMAL HIGH (ref 11.5–15.5)
WBC: 9.9 10*3/uL (ref 4.0–10.5)
nRBC: 0 % (ref 0.0–0.2)

## 2020-05-14 LAB — CBG MONITORING, ED: Glucose-Capillary: 81 mg/dL (ref 70–99)

## 2020-05-14 LAB — SARS CORONAVIRUS 2 BY RT PCR (HOSPITAL ORDER, PERFORMED IN ~~LOC~~ HOSPITAL LAB): SARS Coronavirus 2: NEGATIVE

## 2020-05-14 LAB — LIPASE, BLOOD: Lipase: 26 U/L (ref 11–51)

## 2020-05-14 MED ORDER — ACETAMINOPHEN 650 MG RE SUPP: 650.0000 mg | Freq: Four times a day (QID) | RECTAL | Status: DC | PRN

## 2020-05-14 MED ORDER — METHOTREXATE 2.5 MG PO TABS: 10.0000 mg | ORAL_TABLET | ORAL | Status: DC

## 2020-05-14 MED ORDER — LAMOTRIGINE 25 MG PO TABS
150.0000 mg | ORAL_TABLET | Freq: Every day | ORAL | Status: DC
  Administered 2020-05-14 – 2020-05-18 (×5): 150 mg via ORAL
  Filled 2020-05-14 (×2): qty 1
  Filled 2020-05-14: qty 2
  Filled 2020-05-14 (×2): qty 1

## 2020-05-14 MED ORDER — INSULIN ASPART 100 UNIT/ML ~~LOC~~ SOLN
0.0000 [IU] | Freq: Three times a day (TID) | SUBCUTANEOUS | Status: DC
  Administered 2020-05-17: 1 [IU] via SUBCUTANEOUS
  Filled 2020-05-14: qty 0.09

## 2020-05-14 MED ORDER — VITAMIN D3 25 MCG (1000 UNIT) PO TABS
1000.0000 [IU] | ORAL_TABLET | Freq: Two times a day (BID) | ORAL | Status: DC
  Administered 2020-05-14 – 2020-05-15 (×2): 1000 [IU] via ORAL
  Filled 2020-05-14 (×3): qty 1

## 2020-05-14 MED ORDER — LAMOTRIGINE 25 MG PO TABS
75.0000 mg | ORAL_TABLET | Freq: Every morning | ORAL | Status: DC
  Administered 2020-05-15 – 2020-05-19 (×5): 75 mg via ORAL
  Filled 2020-05-14 (×5): qty 3

## 2020-05-14 MED ORDER — ASCORBIC ACID 500 MG PO TABS
1000.0000 mg | ORAL_TABLET | Freq: Every day | ORAL | Status: DC
  Administered 2020-05-14: 1000 mg via ORAL
  Filled 2020-05-14: qty 2

## 2020-05-14 MED ORDER — NADOLOL 20 MG PO TABS
10.0000 mg | ORAL_TABLET | Freq: Every day | ORAL | Status: DC
  Administered 2020-05-14 – 2020-05-19 (×6): 10 mg via ORAL
  Filled 2020-05-14 (×7): qty 1

## 2020-05-14 MED ORDER — ONDANSETRON HCL 4 MG PO TABS: 4.0000 mg | ORAL_TABLET | Freq: Four times a day (QID) | ORAL | Status: DC | PRN

## 2020-05-14 MED ORDER — PIPERACILLIN-TAZOBACTAM 3.375 G IVPB 30 MIN: 3.3750 g | Freq: Four times a day (QID) | INTRAVENOUS | Status: DC

## 2020-05-14 MED ORDER — LAMOTRIGINE 25 MG PO TABS: 75.0000 mg | ORAL_TABLET | ORAL | Status: DC

## 2020-05-14 MED ORDER — PIPERACILLIN-TAZOBACTAM 3.375 G IVPB
3.3750 g | Freq: Three times a day (TID) | INTRAVENOUS | Status: DC
  Administered 2020-05-15 – 2020-05-19 (×14): 3.375 g via INTRAVENOUS
  Filled 2020-05-14 (×16): qty 50

## 2020-05-14 MED ORDER — ONDANSETRON HCL 4 MG/2ML IJ SOLN
4.0000 mg | Freq: Four times a day (QID) | INTRAMUSCULAR | Status: DC | PRN
  Administered 2020-05-15 – 2020-05-18 (×2): 4 mg via INTRAVENOUS
  Filled 2020-05-14 (×2): qty 2

## 2020-05-14 MED ORDER — HYDROXYCHLOROQUINE SULFATE 200 MG PO TABS
200.0000 mg | ORAL_TABLET | Freq: Every day | ORAL | Status: DC
  Administered 2020-05-15 – 2020-05-19 (×5): 200 mg via ORAL
  Filled 2020-05-14 (×6): qty 1

## 2020-05-14 MED ORDER — MAGNESIUM HYDROXIDE 400 MG/5ML PO SUSP: 30.0000 mL | Freq: Every day | ORAL | Status: DC | PRN

## 2020-05-14 MED ORDER — RIZATRIPTAN BENZOATE 10 MG PO TBDP: 10.0000 mg | ORAL_TABLET | ORAL | Status: DC

## 2020-05-14 MED ORDER — ENOXAPARIN SODIUM 40 MG/0.4ML ~~LOC~~ SOLN
40.0000 mg | SUBCUTANEOUS | Status: DC
  Administered 2020-05-14: 40 mg via SUBCUTANEOUS
  Filled 2020-05-14: qty 0.4

## 2020-05-14 MED ORDER — ALPRAZOLAM 0.5 MG PO TABS: 0.5000 mg | ORAL_TABLET | Freq: Three times a day (TID) | ORAL | Status: DC | PRN

## 2020-05-14 MED ORDER — ACETAMINOPHEN 325 MG PO TABS: 650.0000 mg | ORAL_TABLET | Freq: Four times a day (QID) | ORAL | Status: DC | PRN

## 2020-05-14 MED ORDER — HYDROCODONE-ACETAMINOPHEN 10-325 MG PO TABS: 1.0000 | ORAL_TABLET | Freq: Two times a day (BID) | ORAL | Status: DC | PRN

## 2020-05-14 MED ORDER — B COMPLEX-C PO TABS
2.0000 | ORAL_TABLET | Freq: Every day | ORAL | Status: DC
  Administered 2020-05-14: 2 via ORAL
  Filled 2020-05-14: qty 2

## 2020-05-14 MED ORDER — PANTOPRAZOLE SODIUM 40 MG PO TBEC
40.0000 mg | DELAYED_RELEASE_TABLET | Freq: Every day | ORAL | Status: DC
  Administered 2020-05-14 – 2020-05-19 (×6): 40 mg via ORAL
  Filled 2020-05-14 (×6): qty 1

## 2020-05-14 MED ORDER — ESCITALOPRAM OXALATE 20 MG PO TABS
30.0000 mg | ORAL_TABLET | Freq: Every day | ORAL | Status: DC
  Administered 2020-05-14 – 2020-05-15 (×2): 30 mg via ORAL
  Filled 2020-05-14: qty 3
  Filled 2020-05-14: qty 1

## 2020-05-14 MED ORDER — SODIUM CHLORIDE 0.9 % IV BOLUS
1000.0000 mL | Freq: Once | INTRAVENOUS | Status: AC
  Administered 2020-05-14: 1000 mL via INTRAVENOUS

## 2020-05-14 MED ORDER — DOCUSATE SODIUM 100 MG PO CAPS
100.0000 mg | ORAL_CAPSULE | Freq: Every day | ORAL | Status: DC
  Administered 2020-05-14 – 2020-05-18 (×5): 100 mg via ORAL
  Filled 2020-05-14 (×5): qty 1

## 2020-05-14 MED ORDER — DONEPEZIL HCL 10 MG PO TABS
10.0000 mg | ORAL_TABLET | Freq: Every day | ORAL | Status: DC
  Administered 2020-05-14 – 2020-05-15 (×2): 10 mg via ORAL
  Filled 2020-05-14: qty 1
  Filled 2020-05-14: qty 2

## 2020-05-14 MED ORDER — PREDNISONE 5 MG PO TABS
10.0000 mg | ORAL_TABLET | Freq: Every day | ORAL | Status: DC
  Administered 2020-05-14 – 2020-05-19 (×6): 10 mg via ORAL
  Filled 2020-05-14 (×4): qty 2
  Filled 2020-05-14: qty 1
  Filled 2020-05-14: qty 2

## 2020-05-14 MED ORDER — ROSUVASTATIN CALCIUM 5 MG PO TABS
5.0000 mg | ORAL_TABLET | Freq: Every day | ORAL | Status: DC
  Administered 2020-05-14 – 2020-05-15 (×2): 5 mg via ORAL
  Filled 2020-05-14 (×3): qty 1

## 2020-05-14 MED ORDER — TRAZODONE HCL 50 MG PO TABS: 25.0000 mg | ORAL_TABLET | Freq: Every evening | ORAL | Status: DC | PRN

## 2020-05-14 MED ORDER — PIPERACILLIN-TAZOBACTAM 3.375 G IVPB 30 MIN
3.3750 g | Freq: Once | INTRAVENOUS | Status: AC
  Administered 2020-05-14: 3.375 g via INTRAVENOUS
  Filled 2020-05-14: qty 50

## 2020-05-14 MED ORDER — OLOPATADINE HCL 0.1 % OP SOLN
1.0000 [drp] | Freq: Every day | OPHTHALMIC | Status: DC | PRN
  Filled 2020-05-14: qty 5

## 2020-05-14 MED ORDER — TRAZODONE HCL 50 MG PO TABS
200.0000 mg | ORAL_TABLET | Freq: Every day | ORAL | Status: DC
  Administered 2020-05-14 – 2020-05-18 (×5): 200 mg via ORAL
  Filled 2020-05-14 (×3): qty 4
  Filled 2020-05-14: qty 2
  Filled 2020-05-14: qty 4

## 2020-05-14 MED ORDER — NITROGLYCERIN 0.4 MG SL SUBL: 0.4000 mg | SUBLINGUAL_TABLET | SUBLINGUAL | Status: DC | PRN

## 2020-05-14 MED ORDER — SODIUM CHLORIDE 0.9 % IV SOLN: INTRAVENOUS | Status: DC

## 2020-05-14 NOTE — ED Provider Notes (Signed)
Patient seen/examined in the Emergency Department in conjunction with Advanced Practice Provider [facility] Patient reports abdominal pain, constipation and reports vomiting immediately after eating Exam : Awake alert, no distress.  Patient with epigastric tenderness Plan: CT imaging is pending    Ripley Fraise, MD 05/14/20 [phone]

## 2020-05-14 NOTE — ED Triage Notes (Signed)
Pt presents with c/o vomiting for the past 3 days. Pt reports she fell approx 3 weeks ago and had significant bruising on her stomach and pain from the fall.

## 2020-05-14 NOTE — Consult Note (Signed)
Reason for Consult:  Sigmoid colitis Referring Physician: Cayley Pester is an 59 y.o. female.  HPI:  Pt is a 59 yo F who came to the ED with abdominal pain and n/v.  She has a h/o DM with neuropathy; bipolar d/o; CKD; h/o NSTEMI; psoriatic arthritis on prednisone, methotrexate, enbrel, and plaquenil; and h/o CVA for which she is on plavix.  She had a fall three weeks ago and took some pain medication for this.  She had severe constipation after this and took multiple laxatives and prune juice.  She resumed having BMs the last few days.  She passed quite a bit of gas the last 1-2 days and most of the pain improved.  She is having foul smelling urine and ? Air in the urine.  She denies vaginal discharge, but has had "lots of gas from her vagina."  She has had a colonoscopy over 10 years ago with polyps and had a negative cologard 4 years ago.  She has had the bulk of medical care in [place].    Her MI and CVA were in march of this year.  She did have a cath but did not require stents.    Of note, she hasn't taken any of her meds in the last 3 days due to n/v.  Last MTX and enbrel were 1 week ago.  Her meds for psoriatic arthritis were started around 2 years ago.    Past Medical History:  Diagnosis Date  . Anxiety   . Arthritis    Psoriatic  . Bipolar disorder ([place])   . CKD (chronic kidney disease), stage III   . Depression   . DM type 2 (diabetes mellitus, type 2) ([place]) 11/14/2019  . Neuropathy   . NSTEMI (non-ST elevated myocardial infarction) ([place]) 11/14/2019  . OSA (obstructive sleep apnea)   . Psoriatic arthritis ([place])   . Sleep apnea     Past Surgical History:  Procedure Laterality Date  . LEFT HEART CATH AND CORONARY ANGIOGRAPHY N/A 11/14/2019   Procedure: LEFT HEART CATH AND CORONARY ANGIOGRAPHY;  Surgeon: Martinique, Peter M, MD;  Location: [place] [facility];  Service: Cardiovascular;  Laterality: N/A;    Family History  Problem Relation Age of Onset  . Breast  cancer Neg Hx     Social History:  reports that she has been smoking. She has never used smokeless tobacco. No history on file for alcohol use and drug use.  Allergies:  Allergies  Allergen Reactions  . Carbamazepine Other (See Comments)    Parkinsons like symptoms tremors  . Sertraline Hcl Other (See Comments)    Unknown reaction    Medications:  Current Meds  Medication Sig  . ALPRAZolam (XANAX) 1 MG tablet Take 1 mg by mouth 3 (three) times daily.  . Ascorbic Acid (VITAMIN C) 1000 MG tablet Take 1,000 mg by mouth at bedtime.  Marland Kitchen aspirin EC 81 MG tablet Take 1 tablet (81 mg total) by mouth daily.  Marland Kitchen b complex vitamins tablet Take 2 tablets by mouth at bedtime.  . Cholecalciferol (VITAMIN D-3) 1000 units CAPS Take 1,000 Units by mouth 2 (two) times daily.   Marland Kitchen docusate sodium (COLACE) 100 MG capsule Take 100-200 mg by mouth at bedtime.  . donepezil (ARICEPT) 10 MG tablet Take 10 mg by mouth daily.  Marland Kitchen escitalopram (LEXAPRO) 20 MG tablet Take 30 mg by mouth daily.   Marland Kitchen etanercept (ENBREL) 50 MG/ML injection Inject 50 mg into the skin every Saturday.  Marland Kitchen  HYDROcodone-acetaminophen (NORCO) 10-325 MG tablet Take 1 tablet by mouth 2 (two) times daily as needed (pain).   . hydroxychloroquine (PLAQUENIL) 200 MG tablet Take 200 mg by mouth daily.   Marland Kitchen lamoTRIgine (LAMICTAL) 150 MG tablet Take 75-150 mg by mouth See admin instructions. Take 1/2 tablet (75 mg) by mouth every morning and 1 tablet (150 mg) at night  . levocetirizine (XYZAL) 5 MG tablet Take 5 mg by mouth daily.  . methotrexate (RHEUMATREX) 2.5 MG tablet Take 10 mg by mouth every Monday.  . nadolol (CORGARD) 20 MG tablet Take 0.5 tablets (10 mg total) by mouth daily.  . nitroGLYCERIN (NITROSTAT) 0.4 MG SL tablet Place 1 tablet (0.4 mg total) under the tongue every 5 (five) minutes as needed for chest pain.  Marland Kitchen omeprazole (PRILOSEC) 20 MG capsule Take 20 mg by mouth daily.   . predniSONE (DELTASONE) 10 MG tablet Take 10 mg by mouth  daily.   . rizatriptan (MAXALT-MLT) 10 MG disintegrating tablet Take 10 mg by mouth See admin instructions. Take one tablet (10 mg) by mouth daily as needed for "floaters", May repeat in 2 hours if still needed  . rosuvastatin (CRESTOR) 5 MG tablet Take 1 tablet (5 mg total) by mouth daily.  . traZODone (DESYREL) 100 MG tablet Take 200 mg by mouth at bedtime.  . valACYclovir (VALTREX) 500 MG tablet Take 1 tablet by mouth 2 (two) times a week.     Results for orders placed or performed during the hospital encounter of 05/14/20 (from the past 48 hour(s))  Lipase, blood     Status: None   Collection Time: 05/14/20  3:14 PM  Result Value Ref Range   Lipase 26 11 - 51 U/L    Comment: Performed at [facility], [place] [address] [place], [place] [address]  Comprehensive metabolic panel     Status: Abnormal   Collection Time: 05/14/20  3:14 PM  Result Value Ref Range   Sodium 136 135 - 145 mmol/L   Potassium 4.1 3.5 - 5.1 mmol/L   Chloride 95 (L) 98 - 111 mmol/L   CO2 30 22 - 32 mmol/L   Glucose, Bld 106 (H) 70 - 99 mg/dL    Comment: Glucose reference range applies only to samples taken after fasting for at least 8 hours.   BUN 16 6 - 20 mg/dL   Creatinine, Ser 1.86 (H) 0.44 - 1.00 mg/dL   Calcium 12.9 (H) 8.9 - 10.3 mg/dL   Total Protein 6.4 (L) 6.5 - 8.1 g/dL   Albumin 3.7 3.5 - 5.0 g/dL   AST 22 15 - 41 U/L   ALT 14 0 - 44 U/L   Alkaline Phosphatase 72 38 - 126 U/L   Total Bilirubin 0.5 0.3 - 1.2 mg/dL   GFR calc non Af Amer 29 (L) >60 mL/min   GFR calc Af Amer 33 (L) >60 mL/min   Anion gap 11 5 - 15    Comment: Performed at [facility], [place] [address], [place],  [address]  CBC     Status: Abnormal   Collection Time: 05/14/20  3:14 PM  Result Value Ref Range   WBC 9.9 4.0 - 10.5 K/uL   RBC 3.69 (L) 3.87 - 5.11 MIL/uL   Hemoglobin 13.0 12.0 - 15.0 g/dL   HCT 39.4 36 - 46 %   MCV 106.8 (H) 80.0 - 100.0 fL   MCH 35.2 (H) 26.0 - 34.0 pg    MCHC 33.0 30.0 -  36.0 g/dL   RDW 16.6 (H) 11.5 - 15.5 %   Platelets 261 150 - 400 K/uL   nRBC 0.0 0.0 - 0.2 %    Comment: Performed at [facility], [place] [address] [place], [place] [address]    CT ABDOMEN PELVIS WO CONTRAST  Result Date: 05/14/2020 CLINICAL DATA:  Epigastric pain.  Pain status post fall EXAM: CT ABDOMEN AND PELVIS WITHOUT CONTRAST TECHNIQUE: Multidetector CT imaging of the abdomen and pelvis was performed following the standard protocol without IV contrast. COMPARISON:  None. FINDINGS: Lower chest: The lung bases are clear.  The heart size is normal. Hepatobiliary: The liver is normal. Status post cholecystectomy.There is no biliary ductal dilation. Pancreas: Normal contours without ductal dilatation. No peripancreatic fluid collection. Spleen: Unremarkable. Adrenals/Urinary Tract: --Adrenal glands: Unremarkable. --Right kidney/ureter: No hydronephrosis or radiopaque kidney stones. --Left kidney/ureter: No hydronephrosis or radiopaque kidney stones. --Urinary bladder: There is asymmetric urinary bladder wall thickening. There is a moderate to large volume of air within the urinary bladder. Stomach/Bowel: --Stomach/Duodenum: No hiatal hernia or other gastric abnormality. Normal duodenal course and caliber. --Small bowel: Unremarkable. --Colon: There is a large amount of stool throughout colon. There is a relatively abrupt transition point the level of the sigmoid colon where there are adjacent inflammatory changes circumferential a wall thickening. There is a questionable contained perforation of the sigmoid colon abutting the urinary bladder, measuring approximately 2.1 cm on the coronal series. This is not amenable to percutaneous drainage given its small size. --Appendix: Not visualized. No right lower quadrant inflammation or free fluid. Vascular/Lymphatic: Normal course and caliber of the major abdominal vessels. --No retroperitoneal lymphadenopathy. --No  mesenteric lymphadenopathy. --No pelvic or inguinal lymphadenopathy. Reproductive: Status post hysterectomy. No adnexal mass. Other: There is a small volume of free fluid in the patient's pelvis. The abdominal wall is normal. Musculoskeletal. There is an age-indeterminate compression fracture of the T9 vertebral body with approximately 20% height loss anteriorly. The patient is status post bilateral total hip arthroplasty. There is extensive streak artifact through the patient's pelvis that limits evaluation of this region. IMPRESSION: 1. Large amount of stool throughout the colon with an abrupt transition point at the sigmoid colon. At this location, there is circumferential wall thickening with adjacent inflammatory changes. Findings may be secondary to colitis/diverticulitis. There are findings suspicious for contained perforation abutting the urinary bladder. However, an underlying mass is not excluded and should be further evaluated with colonoscopy. 2. 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 3. Age-indeterminate compression fracture of the T9 vertebral body. Aortic Atherosclerosis (ICD10-I70.0). Electronically Signed   By: Constance Holster M.D.   On: 05/14/2020 18:29   DG Chest 2 View  Result Date: 05/14/2020 CLINICAL DATA:  Cough. EXAM: CHEST - 2 VIEW COMPARISON:  November 13, 2019. FINDINGS: The heart size and mediastinal contours are within normal limits. Both lungs are clear. The visualized skeletal structures are unremarkable. IMPRESSION: No active cardiopulmonary disease. Electronically Signed   By: Marijo Conception M.D.   On: 05/14/2020 18:22    Review of Systems  Constitutional: Negative.   HENT: Negative.   Eyes: Negative.   Respiratory: Positive for cough.   Gastrointestinal: Positive for abdominal distention, abdominal pain and constipation.  Endocrine: Negative.   Genitourinary: Negative  for dysuria.       Foul smelling urine  Musculoskeletal: Positive for arthralgias.  Allergic/Immunologic: Negative.   Neurological: Negative.  Hematological: Negative.   Psychiatric/Behavioral: Negative.   All other systems reviewed and are negative.   Blood pressure 121/66, pulse (!) 56, temperature 99.1 F (37.3 C), resp. rate 18, height 5\' 2"  (1.575 m), weight 90.7 kg, SpO2 96 %. Physical Exam Constitutional:      General: She is not in acute distress.    Appearance: Normal appearance. She is obese. She is not ill-appearing, toxic-appearing or diaphoretic.  HENT:     Head: Normocephalic and atraumatic.     Right Ear: External ear normal.     Left Ear: External ear normal.     Nose: Nose normal.  Eyes:     General: No scleral icterus.    Extraocular Movements: Extraocular movements intact.     Pupils: Pupils are equal, round, and reactive to light.  Cardiovascular:     Rate and Rhythm: Regular rhythm. Bradycardia present.     Pulses: Normal pulses.  Pulmonary:     Effort: Pulmonary effort is normal.  Chest:     Chest wall: No tenderness.  Abdominal:     General: There is distension.     Palpations: There is no mass.     Tenderness: There is no abdominal tenderness. There is no guarding or rebound.     Comments: Protuberant, no hepatosplenomegaly  Musculoskeletal:        General: Swelling present.     Cervical back: Normal range of motion and neck supple.  Skin:    Capillary Refill: Capillary refill takes 2 to 3 seconds.     Coloration: Skin is pale.  Neurological:     General: No focal deficit present.     Mental Status: She is alert and oriented to person, place, and time.  Psychiatric:        Mood and Affect: Mood normal.        Behavior: Behavior normal.        Thought Content: Thought content normal.        Judgment: Judgment normal.       Assessment/Plan:  Sigmoid colitis with possible stricture Possible microperforation  Possible colovesical  fistula Anticoagulation Immunosuppression with TNF alpha inhibitor, steroids, and methotrexate Hypercalcemia  Would start with bowel rest and IV antibiotics Pt high risk for surgery given blood thinners and immunosuppression Await u/a and urine culture.  Will likely need to get urology involved. Pt does not appear to have obstruction given stools over the last few days and passing gas.    Surgery to follow for evidence of improvement to see what kind of timeline we are on. She is non toxic, but certainly her immunosuppression plays a role in blunting her response, so fever and leukocytosis may not be present.      Stark Klein 05/14/2020, 9:39 PM

## 2020-05-14 NOTE — ED Provider Notes (Addendum)
[facility] DEPT Provider Note   CSN: [id number] Arrival date & time: 05/14/20  1421     History Chief Complaint  Patient presents with  . Emesis    Valerie [name] is a 59 y.o. female with a past medical history of diabetes, neuropathy presenting to the ED with a chief complaint of abdominal pain and vomiting. Three weeks ago suffered from a fall. Reports some recurrent falls due to her neuropathy. Had some upper abdominal pain and rib pain after the most recent fall. Was evaluated after this and imaging was negative for traumatic injury or other abnormalities. She was given pain medication and this resulted in constipation.  States that she did not have a bowel movement for about 7 days after taking this.  She discontinued the pain medication and took several different laxatives and prune juice.  She reported having a bowel movement 2 to 3 days ago, daily since then.  She reports nonbloody, nonbilious emesis for the past 3 days as well.  Reports "gas pain" throughout her abdomen.  Reports dry cough as well.  Denies any head injury, loss of consciousness.  No urinary symptoms.  No fever.  HPI     Past Medical History:  Diagnosis Date  . Anxiety   . Arthritis    Psoriatic  . Bipolar disorder ([place])   . CKD (chronic kidney disease), stage III   . Depression   . DM type 2 (diabetes mellitus, type 2) ([place]) 11/14/2019  . Neuropathy   . NSTEMI (non-ST elevated myocardial infarction) ([place]) 11/14/2019  . OSA (obstructive sleep apnea)   . Psoriatic arthritis ([place])   . Sleep apnea     Patient Active Problem List   Diagnosis Date Noted  . Acute colitis 05/14/2020  . NSTEMI (non-ST elevated myocardial infarction) ([place]) 11/14/2019  . DM type 2 (diabetes mellitus, type 2) ([place]) 11/14/2019  . Depression   . Anxiety   . Chest pain   . Obesity, Class III, BMI 40-49.9 (morbid obesity) ([place])   . CKD (chronic kidney disease), stage III   . Psoriatic arthritis  ([place])   . Tobacco abuse   . OSA (obstructive sleep apnea)   . Neuropathy   . Bipolar disorder [facility])     Past Surgical History:  Procedure Laterality Date  . LEFT HEART CATH AND CORONARY ANGIOGRAPHY N/A 11/14/2019   Procedure: LEFT HEART CATH AND CORONARY ANGIOGRAPHY;  Surgeon: Martinique, Peter M, MD;  Location: [facility];  Service: Cardiovascular;  Laterality: N/A;     OB History   No obstetric history on file.     Family History  Problem Relation Age of Onset  . Breast cancer Neg Hx     Social History   Tobacco Use  . Smoking status: Current Every Day Smoker  . Smokeless tobacco: Never Used  Substance Use Topics  . Alcohol use: Not on file  . Drug use: Not on file    Home Medications Prior to Admission medications   Medication Sig Start Date End Date Taking? Authorizing Provider  ALPRAZolam Duanne Moron) 0.5 MG tablet Take 0.5 mg by mouth 3 (three) times daily as needed for anxiety.  04/18/13   [provider]  Ascorbic Acid (VITAMIN C) 1000 MG tablet Take 1,000 mg by mouth at bedtime.    [provider]  aspirin EC 81 MG tablet Take 1 tablet (81 mg total) by mouth daily. 11/14/19 11/13/20  Samuella Cota, MD  b complex vitamins tablet Take  2 tablets by mouth at bedtime.    [provider]  Cholecalciferol (VITAMIN D-3) 1000 units CAPS Take 1,000 Units by mouth 2 (two) times daily.     [provider]  clopidogrel (PLAVIX) 75 MG tablet Take 75 mg by mouth daily.    [provider]  docusate sodium (COLACE) 100 MG capsule Take 100-200 mg by mouth at bedtime.    [provider]  donepezil (ARICEPT) 10 MG tablet Take 10 mg by mouth daily. 10/30/19   [provider]  escitalopram (LEXAPRO) 20 MG tablet Take 30 mg by mouth daily.  09/10/15   [provider]  etanercept (ENBREL) 50 MG/ML injection Inject 50 mg into the skin every Saturday.    [provider]  fluconazole (DIFLUCAN) 100 MG tablet  Take 100 mg by mouth daily. Patient not taking: Reported on 03/21/2020 11/10/19   [provider]  HYDROcodone-acetaminophen (NORCO) 10-325 MG tablet Take 1 tablet by mouth 2 (two) times daily as needed (pain).  07/31/19   [provider]  hydroxychloroquine (PLAQUENIL) 200 MG tablet Take 200 mg by mouth daily.     [provider]  lamoTRIgine (LAMICTAL) 150 MG tablet Take 75-150 mg by mouth See admin instructions. Take 1/2 tablet (75 mg) by mouth every morning and 1 tablet (150 mg) at night 02/27/18   [provider]  methotrexate (RHEUMATREX) 2.5 MG tablet Take 10 mg by mouth every Monday. 10/30/19   [provider]  nadolol (CORGARD) 20 MG tablet Take 0.5 tablets (10 mg total) by mouth daily. 12/04/19   [name], MD  nitroGLYCERIN (NITROSTAT) 0.4 MG SL tablet Place 1 tablet (0.4 mg total) under the tongue every 5 (five) minutes as needed for chest pain. 11/14/19   Samuella Cota, MD  Olopatadine HCl (PATADAY OP) Place 1 drop into both eyes daily as needed (itching).    [provider]  omeprazole (PRILOSEC) 20 MG capsule Take 20 mg by mouth daily.     [provider]  predniSONE (DELTASONE) 10 MG tablet Take 10 mg by mouth daily.  02/11/13   [provider]  rizatriptan (MAXALT-MLT) 10 MG disintegrating tablet Take 10 mg by mouth See admin instructions. Take one tablet (10 mg) by mouth daily as needed for "floaters", May repeat in 2 hours if still needed    [provider]  rosuvastatin (CRESTOR) 5 MG tablet Take 1 tablet (5 mg total) by mouth daily. 03/21/20 06/19/20  [name], MD  traZODone (DESYREL) 150 MG tablet Take 200 mg by mouth at bedtime.  10/27/19   [provider]    Allergies    Carbamazepine and Sertraline hcl  Review of Systems   Review of Systems  Constitutional: Negative for appetite change, chills and fever.  HENT: Negative for ear pain, rhinorrhea, sneezing and sore throat.    Eyes: Negative for photophobia and visual disturbance.  Respiratory: Positive for cough. Negative for chest tightness, shortness of breath and wheezing.   Cardiovascular: Negative for chest pain and palpitations.  Gastrointestinal: Positive for abdominal pain, constipation, nausea and vomiting. Negative for blood in stool and diarrhea.  Genitourinary: Negative for dysuria, hematuria and urgency.  Musculoskeletal: Negative for myalgias.  Skin: Negative for rash.  Neurological: Negative for dizziness, weakness and light-headedness.    Physical Exam Updated Vital Signs BP 109/67   Pulse 62   Temp 99.1 F (37.3 C)   Resp 14   Ht 5\' 2"  (1.575 m)  Wt 90.7 kg   SpO2 96%   BMI 36.58 kg/m   Physical Exam Vitals and nursing note reviewed.  Constitutional:      General: She is not in acute distress.    Appearance: She is well-developed. She is obese.  HENT:     Head: Normocephalic and atraumatic.     Nose: Nose normal.  Eyes:     General: No scleral icterus.       Right eye: No discharge.        Left eye: No discharge.     Conjunctiva/sclera: Conjunctivae normal.  Cardiovascular:     Rate and Rhythm: Normal rate and regular rhythm.     Heart sounds: Normal heart sounds. No murmur heard.  No friction rub. No gallop.   Pulmonary:     Effort: Pulmonary effort is normal. No respiratory distress.     Breath sounds: Normal breath sounds.  Abdominal:     General: Bowel sounds are normal. There is no distension.     Palpations: Abdomen is soft.     Tenderness: There is abdominal tenderness (diffuse upper and lower). There is no guarding or rebound.  Musculoskeletal:        General: Normal range of motion.     Cervical back: Normal range of motion and neck supple.  Skin:    General: Skin is warm and dry.     Findings: No rash.  Neurological:     Mental Status: She is alert.     Motor: No abnormal muscle tone.     Coordination: Coordination normal.     ED Results /  Procedures / Treatments   Labs (all labs ordered are listed, but only abnormal results are displayed) Labs Reviewed  COMPREHENSIVE METABOLIC PANEL - Abnormal; Notable for the following components:      Result Value   Chloride 95 (*)    Glucose, Bld 106 (*)    Creatinine, Ser 1.86 (*)    Calcium 12.9 (*)    Total Protein 6.4 (*)    GFR calc non Af Amer 29 (*)    GFR calc Af Amer 33 (*)    All other components within normal limits  CBC - Abnormal; Notable for the following components:   RBC 3.69 (*)    MCV 106.8 (*)    MCH 35.2 (*)    RDW 16.6 (*)    All other components within normal limits  SARS CORONAVIRUS 2 BY RT PCR (HOSPITAL ORDER, [facility])  LIPASE, BLOOD  URINALYSIS, ROUTINE W REFLEX MICROSCOPIC  BASIC METABOLIC PANEL  CBC    EKG EKG Interpretation  Date/Time:  Tuesday May 14 2020 18:53:32 EDT Ventricular Rate:  57 PR Interval:    QRS Duration: 90 QT Interval:  358 QTC Calculation: 352 R Axis:   -6 Text Interpretation: Sinus rhythm Prolonged PR interval significant artifact limits evaluation Confirmed by Ripley Fraise [id number]) on 05/14/2020 7:11:19 PM   Radiology CT ABDOMEN PELVIS WO CONTRAST  Result Date: 05/14/2020 CLINICAL DATA:  Epigastric pain.  Pain status post fall EXAM: CT ABDOMEN AND PELVIS WITHOUT CONTRAST TECHNIQUE: Multidetector CT imaging of the abdomen and pelvis was performed following the standard protocol without IV contrast. COMPARISON:  None. FINDINGS: Lower chest: The lung bases are clear.  The heart size is normal. Hepatobiliary: The liver is normal. Status post cholecystectomy.There is no biliary ductal dilation. Pancreas: Normal contours without ductal dilatation. No peripancreatic fluid collection. Spleen: Unremarkable. Adrenals/Urinary Tract: --Adrenal glands: Unremarkable. --Right  kidney/ureter: No hydronephrosis or radiopaque kidney stones. --Left kidney/ureter: No hydronephrosis or radiopaque kidney  stones. --Urinary bladder: There is asymmetric urinary bladder wall thickening. There is a moderate to large volume of air within the urinary bladder. Stomach/Bowel: --Stomach/Duodenum: No hiatal hernia or other gastric abnormality. Normal duodenal course and caliber. --Small bowel: Unremarkable. --Colon: There is a large amount of stool throughout colon. There is a relatively abrupt transition point the level of the sigmoid colon where there are adjacent inflammatory changes circumferential a wall thickening. There is a questionable contained perforation of the sigmoid colon abutting the urinary bladder, measuring approximately 2.1 cm on the coronal series. This is not amenable to percutaneous drainage given its small size. --Appendix: Not visualized. No right lower quadrant inflammation or free fluid. Vascular/Lymphatic: Normal course and caliber of the major abdominal vessels. --No retroperitoneal lymphadenopathy. --No mesenteric lymphadenopathy. --No pelvic or inguinal lymphadenopathy. Reproductive: Status post hysterectomy. No adnexal mass. Other: There is a small volume of free fluid in the patient's pelvis. The abdominal wall is normal. Musculoskeletal. There is an age-indeterminate compression fracture of the T9 vertebral body with approximately 20% height loss anteriorly. The patient is status post bilateral total hip arthroplasty. There is extensive streak artifact through the patient's pelvis that limits evaluation of this region. IMPRESSION: 1. Large amount of stool throughout the colon with an abrupt transition point at the sigmoid colon. At this location, there is circumferential wall thickening with adjacent inflammatory changes. Findings may be secondary to colitis/diverticulitis. There are findings suspicious for contained perforation abutting the urinary bladder. However, an underlying mass is not excluded and should be further evaluated with colonoscopy. 2. 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 3. Age-indeterminate compression fracture of the T9 vertebral body. Aortic Atherosclerosis (ICD10-I70.0). Electronically Signed   By: Constance Holster M.D.   On: 05/14/2020 18:29   DG Chest 2 View  Result Date: 05/14/2020 CLINICAL DATA:  Cough. EXAM: CHEST - 2 VIEW COMPARISON:  November 13, 2019. FINDINGS: The heart size and mediastinal contours are within normal limits. Both lungs are clear. The visualized skeletal structures are unremarkable. IMPRESSION: No active cardiopulmonary disease. Electronically Signed   By: Marijo Conception M.D.   On: 05/14/2020 18:22    Procedures .Critical Care Performed by: Delia Heady, PA-C Authorized by: Delia Heady, PA-C   Critical care provider statement:    Critical care time (minutes):  35   Critical care was necessary to treat or prevent imminent or life-threatening deterioration of the following conditions:  Sepsis and shock   Critical care was time spent personally by me on the following activities:  Development of treatment plan with patient or surrogate, discussions with consultants, discussions with primary provider, obtaining history from patient or surrogate, ordering and performing treatments and interventions, ordering and review of laboratory studies, ordering and review of radiographic studies, re-evaluation of patient's condition and review of old charts   I assumed direction of critical care for this patient from another provider in my specialty: no     (including critical care time)  Medications Ordered in ED Medications  HYDROcodone-acetaminophen (NORCO) 10-325 MG per tablet 1 tablet (has no administration in time range)  rizatriptan (MAXALT-MLT) disintegrating tablet 10 mg (has no administration in time range)  hydroxychloroquine (PLAQUENIL) tablet 200 mg (has no administration in time range)  methotrexate (RHEUMATREX) tablet 10 mg  (has no administration in time range)  nadolol (CORGARD)  tablet 10 mg (has no administration in time range)  nitroGLYCERIN (NITROSTAT) SL tablet 0.4 mg (has no administration in time range)  rosuvastatin (CRESTOR) tablet 5 mg (has no administration in time range)  ALPRAZolam (XANAX) tablet 0.5 mg (has no administration in time range)  donepezil (ARICEPT) tablet 10 mg (has no administration in time range)  escitalopram (LEXAPRO) tablet 30 mg (has no administration in time range)  traZODone (DESYREL) tablet 200 mg (has no administration in time range)  predniSONE (DELTASONE) tablet 10 mg (has no administration in time range)  docusate sodium (COLACE) capsule 100-200 mg (has no administration in time range)  pantoprazole (PROTONIX) EC tablet 40 mg (has no administration in time range)  lamoTRIgine (LAMICTAL) tablet 75-150 mg (has no administration in time range)  ascorbic acid (VITAMIN C) tablet 1,000 mg (has no administration in time range)  b complex vitamins tablet 2 tablet (has no administration in time range)  Vitamin D-3 CAPS 1,000 Units (has no administration in time range)  olopatadine (PATANOL) 0.1 % ophthalmic solution 1 drop (has no administration in time range)  enoxaparin (LOVENOX) injection 40 mg (has no administration in time range)  0.9 %  sodium chloride infusion (has no administration in time range)  acetaminophen (TYLENOL) tablet 650 mg (has no administration in time range)    Or  acetaminophen (TYLENOL) suppository 650 mg (has no administration in time range)  traZODone (DESYREL) tablet 25 mg (has no administration in time range)  magnesium hydroxide (MILK OF MAGNESIA) suspension 30 mL (has no administration in time range)  ondansetron (ZOFRAN) tablet 4 mg (has no administration in time range)    Or  ondansetron (ZOFRAN) injection 4 mg (has no administration in time range)  piperacillin-tazobactam (ZOSYN) IVPB 3.375 g (has no administration in time range)  sodium chloride  0.9 % bolus 1,000 mL (1,000 mLs Intravenous New Bag/Given 05/14/20 1918)  piperacillin-tazobactam (ZOSYN) IVPB 3.375 g (3.375 g Intravenous New Bag/Given 05/14/20 1930)    ED Course  I have reviewed the triage vital signs and the nursing notes.  Pertinent labs & imaging results that were available during my care of the patient were reviewed by me and considered in my medical decision making (see chart for details).    MDM Rules/Calculators/A&P                          59 year old female with a past medical history of diabetes, neuropathy presenting to the ED with a chief complaint of abdominal pain and vomiting.  Had a fall 3 weeks ago, was seen and evaluated with reassuring imaging of chest and ribs.  She was told to take pain medication.  This caused her to be constipated for about a week.  She began taking over-the-counter medications, laxatives and prune juice with improvement in her constipation.  She has had bowel movements for the past 2 to 3 days.  Reports nonbloody, nonbilious emesis for the past 3 days.  Describes it as "gas pain" throughout her abdomen.  Reports prior colonoscopy.  On exam there is some tenderness of the upper and lower abdomen diffusely.  She is status post cholecystectomy.  Lab work significant for creatinine of 1.8 which is slightly higher than her baseline of 1.6.  CBC unremarkable.  Lipase unremarkable.  Urinalysis is pending.  CXR unremarkable. CT of the abdomen pelvis without contrast shows an area of possible stricture versus mass versus possible diverticulitis with perforation.  Spoke to Dr. Barry Dienes, on-call for  general surgery who reviewed the patient's imaging and is concerned for possible chronic diverticulitis or underlying mass.  She recommends admission, IV antibiotics, bowel rest and they will evaluate the patient in consult to determine if she needs surgical intervention.  Patient updated on plan of care.  I have ordered broad-spectrum antibiotics.  All  imaging, if done today, including plain films, CT scans, and ultrasounds, independently reviewed by me, and interpretations confirmed via formal radiology reads.  Portions of this note were generated with [employer]. Dictation errors may occur despite best attempts at proofreading.  Final Clinical Impression(s) / ED Diagnoses Final diagnoses:  Intra-abdominal infection    Rx / DC Orders ED Discharge Orders    None         Delia Heady, PA-C 05/14/20 2050    Ripley Fraise, MD 05/14/20 2139

## 2020-05-14 NOTE — H&P (Signed)
[place]   PATIENT NAME: Valerie [name]    MR#:  [id number]  DATE OF BIRTH:  Jul 01, 1961  DATE OF ADMISSION:  05/14/2020  PRIMARY CARE PHYSICIAN: Ophelia Shoulder, MD   REQUESTING/REFERRING PHYSICIAN: Delia Heady, MDh  CHIEF COMPLAINT:   Chief Complaint  Patient presents with  . Emesis    HISTORY OF PRESENT ILLNESS:  Valerie [name]  is a 59 y.o. Caucasian female with a known history of type 2 diabetes mellitus, peripheral neuropathy, stage III chronic kidney disease, obstructive sleep apnea, coronary artery disease and psoriatic arthritis, presented to the emergency room with acute onset of abdominal pain with associated constipation for about a week for which she took prune juice, Dulcolax and Colace with only small bowel movements and later had nausea and vomiting.  Prior to that the patient had a fall about 3 weeks ago and was given pain medications including oxycodone for left sided rib pain and upper abdominal pain and dose were thought to be the culprit for her constipation.  No dyspnea or cough or wheezing.  No dysuria, oliguria or hematuria or flank pain.  No chest pain or palpitations. She has not been vaccinated for COVID-19 however she is enrolled in a trial with [employer] since January that apparently involves intrusion intake. No recent exposure to COVID-19. She has upper and lower extremity bruising that she relates to her neuropathy and partly due to her fall.  Upon presentation to the emergency room, blood pressure was 97/72 with otherwise normal vital signs.  Labs revealed a creatinine of 1.86 close to baseline with a BUN of 16, calcium was 12.9 with albumin of 3.7 with otherwise unremarkable CMP.  CBC showed relative macrocytosis.  Abdominal pelvic CT scan showed the following:  1. Large amount of stool throughout the colon with an abrupt transition point at the sigmoid colon. At this location, there is circumferential wall thickening with adjacent inflammatory  changes. Findings may be secondary to colitis/diverticulitis. There are findings suspicious for contained perforation abutting the urinary bladder. However, an underlying mass is not excluded and should be further evaluated with colonoscopy. 2. 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 3. Age-indeterminate compression fracture of the T9 vertebral body and Aortic Atherosclerosis.  The patient was given 1 L bolus of IV normal saline as well as IV Zosyn.  She will be admitted to a medical bed for further evaluation and management. PAST MEDICAL HISTORY:   Past Medical History:  Diagnosis Date  . Anxiety   . Arthritis    Psoriatic  . Bipolar disorder ([place])   . CKD (chronic kidney disease), stage III   . Depression   . DM type 2 (diabetes mellitus, type 2) ([place]) 11/14/2019  . Neuropathy   . NSTEMI (non-ST elevated myocardial infarction) ([place]) 11/14/2019  . OSA (obstructive sleep apnea)   . Psoriatic arthritis ([place])   . Sleep apnea     PAST SURGICAL HISTORY:   Past Surgical History:  Procedure Laterality Date  . LEFT HEART CATH AND CORONARY ANGIOGRAPHY N/A 11/14/2019   Procedure: LEFT HEART CATH AND CORONARY ANGIOGRAPHY;  Surgeon: Martinique, Peter M, MD;  Location: [facility];  Service: Cardiovascular;  Laterality: N/A;    SOCIAL HISTORY:   Social History   Tobacco Use  . Smoking status: Current Every Day Smoker  . Smokeless tobacco: Never Used  Substance Use Topics  . Alcohol  use: Not on file    FAMILY HISTORY:   Family History  Problem Relation Age of Onset  . Breast cancer Neg Hx     DRUG ALLERGIES:   Allergies  Allergen Reactions  . Carbamazepine Other (See Comments)    Parkinsons like symptoms tremors  . Sertraline Hcl Other (See Comments)    Unknown reaction    REVIEW OF SYSTEMS:   ROS As per history of present illness. All pertinent systems were  reviewed above. Constitutional, HEENT, cardiovascular, respiratory, GI, GU, musculoskeletal, neuro, psychiatric, endocrine, integumentary and hematologic systems were reviewed and are otherwise negative/unremarkable except for positive findings mentioned above in the HPI.   MEDICATIONS AT HOME:   Prior to Admission medications   Medication Sig Start Date End Date Taking? Authorizing Provider  ALPRAZolam Duanne Moron) 0.5 MG tablet Take 0.5 mg by mouth 3 (three) times daily as needed for anxiety.  04/18/13   [provider]  Ascorbic Acid (VITAMIN C) 1000 MG tablet Take 1,000 mg by mouth at bedtime.    [provider]  aspirin EC 81 MG tablet Take 1 tablet (81 mg total) by mouth daily. 11/14/19 11/13/20  Samuella Cota, MD  b complex vitamins tablet Take 2 tablets by mouth at bedtime.    [provider]  Cholecalciferol (VITAMIN D-3) 1000 units CAPS Take 1,000 Units by mouth 2 (two) times daily.     [provider]  clopidogrel (PLAVIX) 75 MG tablet Take 75 mg by mouth daily.    [provider]  docusate sodium (COLACE) 100 MG capsule Take 100-200 mg by mouth at bedtime.    [provider]  donepezil (ARICEPT) 10 MG tablet Take 10 mg by mouth daily. 10/30/19   [provider]  escitalopram (LEXAPRO) 20 MG tablet Take 30 mg by mouth daily.  09/10/15   [provider]  etanercept (ENBREL) 50 MG/ML injection Inject 50 mg into the skin every Saturday.    [provider]  fluconazole (DIFLUCAN) 100 MG tablet Take 100 mg by mouth daily. Patient not taking: Reported on 03/21/2020 11/10/19   [provider]  HYDROcodone-acetaminophen (NORCO) 10-325 MG tablet Take 1 tablet by mouth 2 (two) times daily as needed (pain).  07/31/19   [provider]  hydroxychloroquine (PLAQUENIL) 200 MG tablet Take 200 mg by mouth daily.     [provider]  lamoTRIgine (LAMICTAL) 150 MG tablet Take 75-150 mg by mouth See admin  instructions. Take 1/2 tablet (75 mg) by mouth every morning and 1 tablet (150 mg) at night 02/27/18   [provider]  methotrexate (RHEUMATREX) 2.5 MG tablet Take 10 mg by mouth every Monday. 10/30/19   [provider]  nadolol (CORGARD) 20 MG tablet Take 0.5 tablets (10 mg total) by mouth daily. 12/04/19   [name], MD  nitroGLYCERIN (NITROSTAT) 0.4 MG SL tablet Place 1 tablet (0.4 mg total) under the tongue every 5 (five) minutes as needed for chest pain. 11/14/19   Samuella Cota, MD  Olopatadine HCl (PATADAY OP) Place 1 drop into both eyes daily as needed (itching).    [provider]  omeprazole (PRILOSEC) 20 MG capsule Take 20 mg by mouth daily.     [provider]  predniSONE (DELTASONE) 10 MG tablet Take 10 mg by mouth daily.  02/11/13   [provider]  rizatriptan (MAXALT-MLT) 10 MG disintegrating tablet Take 10 mg by mouth See admin instructions. Take one tablet (10 mg) by mouth daily  as needed for "floaters", May repeat in 2 hours if still needed    [provider]  rosuvastatin (CRESTOR) 5 MG tablet Take 1 tablet (5 mg total) by mouth daily. 03/21/20 06/19/20  [name], MD  traZODone (DESYREL) 150 MG tablet Take 200 mg by mouth at bedtime.  10/27/19   [provider]      VITAL SIGNS:  Blood pressure 109/67, pulse 62, temperature 99.1 F (37.3 C), resp. rate 14, height 5\' 2"  (1.575 m), weight 90.7 kg, SpO2 96 %.  PHYSICAL EXAMINATION:  Physical Exam  GENERAL:  59 y.o.-year-old Caucasian female self patient lying in the bed with no acute distress.  EYES: Pupils equal, round, reactive to light and accommodation. No scleral icterus. Extraocular muscles intact.  HEENT: Head atraumatic, normocephalic. Oropharynx and nasopharynx clear.  NECK:  Supple, no jugular venous distention. No thyroid enlargement, no tenderness.  LUNGS: Normal breath sounds bilaterally, no wheezing, rales,rhonchi or crepitation. No  use of accessory muscles of respiration.  CARDIOVASCULAR: Regular rate and rhythm, S1, S2 normal. No murmurs, rubs, or gallops.  ABDOMEN: Soft, nondistended, with left upper quadrant, suprapubic and infraumbilical tenderness without rebound tenderness guarding or rigidity.. Bowel sounds present. No organomegaly or discrete mass.  EXTREMITIES: No pedal edema, cyanosis, or clubbing.  NEUROLOGIC: Cranial nerves II through XII are intact. Muscle strength 5/5 in all extremities. Sensation intact. Gait not checked.  PSYCHIATRIC: The patient is alert and oriented x 3.  Normal affect and good eye contact. SKIN: She has bilateral lower extremity and upper extremity bruises/contusions. LABORATORY PANEL:   CBC Recent Labs  Lab 05/14/20 1514  WBC 9.9  HGB 13.0  HCT 39.4  PLT 261   ------------------------------------------------------------------------------------------------------------------  Chemistries  Recent Labs  Lab 05/14/20 1514  NA 136  K 4.1  CL 95*  CO2 30  GLUCOSE 106*  BUN 16  CREATININE 1.86*  CALCIUM 12.9*  AST 22  ALT 14  ALKPHOS 72  BILITOT 0.5   ------------------------------------------------------------------------------------------------------------------  Cardiac Enzymes No results for input(s): TROPONINI in the last 168 hours. ------------------------------------------------------------------------------------------------------------------  RADIOLOGY:  CT ABDOMEN PELVIS WO CONTRAST  Result Date: 05/14/2020 CLINICAL DATA:  Epigastric pain.  Pain status post fall EXAM: CT ABDOMEN AND PELVIS WITHOUT CONTRAST TECHNIQUE: Multidetector CT imaging of the abdomen and pelvis was performed following the standard protocol without IV contrast. COMPARISON:  None. FINDINGS: Lower chest: The lung bases are clear.  The heart size is normal. Hepatobiliary: The liver is normal. Status post cholecystectomy.There is no biliary ductal dilation. Pancreas: Normal contours without  ductal dilatation. No peripancreatic fluid collection. Spleen: Unremarkable. Adrenals/Urinary Tract: --Adrenal glands: Unremarkable. --Right kidney/ureter: No hydronephrosis or radiopaque kidney stones. --Left kidney/ureter: No hydronephrosis or radiopaque kidney stones. --Urinary bladder: There is asymmetric urinary bladder wall thickening. There is a moderate to large volume of air within the urinary bladder. Stomach/Bowel: --Stomach/Duodenum: No hiatal hernia or other gastric abnormality. Normal duodenal course and caliber. --Small bowel: Unremarkable. --Colon: There is a large amount of stool throughout colon. There is a relatively abrupt transition point the level of the sigmoid colon where there are adjacent inflammatory changes circumferential a wall thickening. There is a questionable contained perforation of the sigmoid colon abutting the urinary bladder, measuring approximately 2.1 cm on the coronal series. This is not amenable to percutaneous drainage given its small size. --Appendix: Not visualized. No right lower quadrant inflammation or free fluid. Vascular/Lymphatic: Normal course and caliber of the major abdominal vessels. --No retroperitoneal lymphadenopathy. --No mesenteric lymphadenopathy. --No pelvic  or inguinal lymphadenopathy. Reproductive: Status post hysterectomy. No adnexal mass. Other: There is a small volume of free fluid in the patient's pelvis. The abdominal wall is normal. Musculoskeletal. There is an age-indeterminate compression fracture of the T9 vertebral body with approximately 20% height loss anteriorly. The patient is status post bilateral total hip arthroplasty. There is extensive streak artifact through the patient's pelvis that limits evaluation of this region. IMPRESSION: 1. Large amount of stool throughout the colon with an abrupt transition point at the sigmoid colon. At this location, there is circumferential wall thickening with adjacent inflammatory changes. Findings may  be secondary to colitis/diverticulitis. There are findings suspicious for contained perforation abutting the urinary bladder. However, an underlying mass is not excluded and should be further evaluated with colonoscopy. 2. 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 3. Age-indeterminate compression fracture of the T9 vertebral body. Aortic Atherosclerosis (ICD10-I70.0). Electronically Signed   By: Constance Holster M.D.   On: 05/14/2020 18:29   DG Chest 2 View  Result Date: 05/14/2020 CLINICAL DATA:  Cough. EXAM: CHEST - 2 VIEW COMPARISON:  November 13, 2019. FINDINGS: The heart size and mediastinal contours are within normal limits. Both lungs are clear. The visualized skeletal structures are unremarkable. IMPRESSION: No active cardiopulmonary disease. Electronically Signed   By: Marijo Conception M.D.   On: 05/14/2020 18:22      IMPRESSION AND PLAN:   1.  Acute colitis/diverticulitis, rule out colonic mass. -The patient will be admitted to a medical bed. -We will continue antibiotic therapy with IV Zosyn. -Pain management will be provided. -General surgery consultation will be obtained. -Dr. Barry Dienes was notified about the patient.  2.  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 -General surgery consultation will be obtained as mentioned above. -Dr. Barry Dienes was notified and is aware about the patient. -The patient will be placed n.p.o. after midnight. -Aspirin and Plavix will be held off.  3.  Hypercalcemia. -The patient will be hydrated with IV normal saline and will follow her calcium level. -This could be contributing to her constipation and could be related to colonic neoplastic mass.  4.  Type 2 diabetes mellitus. -The patient will be placed on supplement coverage with NovoLog.  5.  Coronary artery disease. -We will continue statin and hold off aspirin  and Plavix for now.  6.  Dyslipidemia. -Statin therapy will be resumed as mentioned above.  7.  Bipolar disorder. -We will continue Lamictal.  8.  Anxiety and depression. -We will continue Xanax and Lexapro.  9.  Psoriatic arthritis. -We will continue Plaquenil and methotrexate. -She takes outpatient Enbrel as well.  10.  DVT prophylaxis. -Subcutaneous Lovenox   All the records are reviewed and case discussed with ED provider. The plan of care was discussed in details with the patient (and family). I answered all questions. The patient agreed to proceed with the above mentioned plan. Further management will depend upon hospital course.   CODE STATUS: Full code  Status is: Inpatient  Remains inpatient appropriate because:Ongoing active pain requiring inpatient pain management, Ongoing diagnostic testing needed not appropriate for outpatient work up, Unsafe d/c plan, IV treatments appropriate due to intensity of illness or inability to take PO and Inpatient level of care appropriate due to severity of illness   Dispo: The patient is from: Home              Anticipated d/c  is to: Home              Anticipated d/c date is: 3 days              Patient currently is not medically stable to d/c.   TOTAL TIME TAKING CARE OF THIS PATIENT: 60 minutes.    Christel Mormon M.D on 05/14/2020 at 7:57 PM  [facility]   From 7 PM-7 AM, contact night-coverage [email or web address]  CC: Primary care physician; Ophelia Shoulder, MD   Note: This dictation was prepared with Dragon dictation along with smaller phrase technology. Any transcriptional typo errors that result from this process are unintentional.

## 2020-05-14 NOTE — Telephone Encounter (Signed)
Left message for pt to call. Also sent a mychart message. Advised she could reply there or by calling our office back.

## 2020-05-15 ENCOUNTER — Telehealth: Payer: Self-pay | Admitting: Internal Medicine

## 2020-05-15 DIAGNOSIS — E782 Mixed hyperlipidemia: Secondary | ICD-10-CM

## 2020-05-15 DIAGNOSIS — B999 Unspecified infectious disease: Secondary | ICD-10-CM

## 2020-05-15 LAB — GLUCOSE, CAPILLARY
Glucose-Capillary: 66 mg/dL — ABNORMAL LOW (ref 70–99)
Glucose-Capillary: 80 mg/dL (ref 70–99)
Glucose-Capillary: 91 mg/dL (ref 70–99)

## 2020-05-15 LAB — BASIC METABOLIC PANEL
Anion gap: 6 (ref 5–15)
BUN: 15 mg/dL (ref 6–20)
CO2: 27 mmol/L (ref 22–32)
Calcium: 11.2 mg/dL — ABNORMAL HIGH (ref 8.9–10.3)
Chloride: 102 mmol/L (ref 98–111)
Creatinine, Ser: 1.67 mg/dL — ABNORMAL HIGH (ref 0.44–1.00)
GFR calc Af Amer: 38 mL/min — ABNORMAL LOW (ref >60)
GFR calc non Af Amer: 33 mL/min — ABNORMAL LOW (ref >60)
Glucose, Bld: 87 mg/dL (ref 70–99)
Potassium: 3.7 mmol/L (ref 3.5–5.1)
Sodium: 135 mmol/L (ref 135–145)

## 2020-05-15 LAB — CBC
HCT: 32.4 % — ABNORMAL LOW (ref 36.0–46.0)
Hemoglobin: 10.8 g/dL — ABNORMAL LOW (ref 12.0–15.0)
MCH: 35.4 pg — ABNORMAL HIGH (ref 26.0–34.0)
MCHC: 33.3 g/dL (ref 30.0–36.0)
MCV: 106.2 fL — ABNORMAL HIGH (ref 80.0–100.0)
Platelets: 181 10*3/uL (ref 150–400)
RBC: 3.05 MIL/uL — ABNORMAL LOW (ref 3.87–5.11)
RDW: 16.3 % — ABNORMAL HIGH (ref 11.5–15.5)
WBC: 9.1 10*3/uL (ref 4.0–10.5)
nRBC: 0 % (ref 0.0–0.2)

## 2020-05-15 LAB — HEMOGLOBIN A1C
Hgb A1c MFr Bld: 5.4 % (ref 4.8–5.6)
Mean Plasma Glucose: 108.28 mg/dL

## 2020-05-15 LAB — CBG MONITORING, ED: Glucose-Capillary: 95 mg/dL (ref 70–99)

## 2020-05-15 MED ORDER — SUMATRIPTAN SUCCINATE 50 MG PO TABS
100.0000 mg | ORAL_TABLET | ORAL | Status: DC | PRN
  Filled 2020-05-15: qty 2

## 2020-05-15 MED ORDER — DEXTROSE-NACL 5-0.9 % IV SOLN: INTRAVENOUS | Status: DC

## 2020-05-15 MED ORDER — CHLORHEXIDINE GLUCONATE CLOTH 2 % EX PADS
6.0000 | MEDICATED_PAD | Freq: Every day | CUTANEOUS | Status: DC
  Administered 2020-05-15 – 2020-05-17 (×3): 6 via TOPICAL

## 2020-05-15 MED ORDER — SODIUM CHLORIDE 0.9 % IV BOLUS
500.0000 mL | Freq: Once | INTRAVENOUS | Status: AC
  Administered 2020-05-15: 500 mL via INTRAVENOUS

## 2020-05-15 MED ORDER — HYDROMORPHONE HCL 1 MG/ML IJ SOLN
0.5000 mg | INTRAMUSCULAR | Status: DC | PRN
  Administered 2020-05-15 – 2020-05-18 (×7): 0.5 mg via INTRAVENOUS
  Filled 2020-05-15 (×7): qty 0.5

## 2020-05-15 NOTE — Procedures (Signed)
Procedure Note  Preprocedure diagnosis:  1.  Colovesicular fistula  Postprocedure diagnosis: 1.  Colovesicular fistula  Procedure(s): 1.  Flexible cystoscopy  Surgeon: Rexene Alberts, MD  Anesthesia:  None  Complications:  None  EBL:  none  Specimens: 1. none  Drains/Catheters: 1.  18Fr foley catheter  Intraoperative findings:   1. Significant amount of debris in the bladder limiting the study. No large bladder mass identified.   Indication:  Valerie [name] is a 59 y.o. female with concern for colovesicular fistula seen on CT A/P 05/14/2020. After a thorough discussion, including all relevant risks, benefits and alternatives, she agrees to proceed.  Description of procedure: After the patient was prepped and draped in the usual sterile fashion, flexible cystourethroscopy was performed.  This revealed a normal urethra. Systematic examination of the bladder revealed significant amount of debris present limiting the diagnostic use. No large bladder tumor identified. Unable to find fistulous tract. An 18 Fr foley catheter was placed.  Plan:  Leave foley catheter to gravity drainage. Will repeat cystoscopy after adequately drained.   [facility]  Pager: [phone]

## 2020-05-15 NOTE — Progress Notes (Signed)
PROGRESS NOTE    Valerie [name]  DTO:[id number] DOB: [birth year] DOA: 05/14/2020 PCP: Ophelia Shoulder, MD  Brief Narrative:Valerie [name]  is a 59 y.o. Caucasian female with a known history of type 2 diabetes mellitus, peripheral neuropathy, stage III chronic kidney disease, obstructive sleep apnea, coronary artery disease and psoriatic arthritis, presented to the emergency room with acute onset of abdominal pain with associated constipation for about a week for which she took prune juice, Dulcolax and Colace with only small bowel movements and later had nausea and vomiting.  Prior to that the patient had a fall about 3 weeks ago and was given pain medications including oxycodone for left sided rib pain and upper abdominal pain and dose were thought to be the culprit for her constipation.  No dyspnea or cough or wheezing.  No dysuria, oliguria or hematuria or flank pain.  No chest pain or palpitations. She has not been vaccinated for COVID-19 however she is enrolled in a trial with [employer] since January that apparently involves intrusion intake. No recent exposure to COVID-19. She has upper and lower extremity bruising that she relates to her neuropathy and partly due to her fall.  Upon presentation to the emergency room, blood pressure was 97/72 with otherwise normal vital signs.  Labs revealed a creatinine of 1.86 close to baseline with a BUN of 16, calcium was 12.9 with albumin of 3.7 with otherwise unremarkable CMP.  CBC showed relative macrocytosis.  Abdominal pelvic CT scan showed the following:  1. Large amount of stool throughout the colon with an abrupt transition point at the sigmoid colon. At this location, there is circumferential wall thickening with adjacent inflammatory changes. Findings may be secondary to colitis/diverticulitis. There are findings suspicious for contained perforation abutting the urinary bladder. However, an underlying mass is not excluded and should be further  evaluated with colonoscopy. 2. 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 3. 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 At this location, there is circumferential wall thickening with adjacent inflammatory changes. Findings may be secondary to colitis/diverticulitis. There are findings suspicious for contained perforation abutting the urinary bladder. However, an underlying mass is not excluded and should be further evaluated with colonoscopy. 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 Age-indeterminate compression fracture of the T9 vertebral body Surgery following Urology consulted GI consulted Continue IV Zosyn n.p.o. and IV fluids  #2 type 2 diabetes-patient is n.p.o. continue SSI  #3 history of stroke on Plavix  #4 history of psoriatic arthritis on multiple meds including methotrexate Plaquenil and Enbrel   Estimated body mass index is 36.58 kg/m as calculated from the following:   Height as of this encounter: 5\' 2"  (1.575 m).   Weight as of this encounter: 90.7 kg.  DVT prophylaxis: scd Code Status: full Family Communication:dw daughter Disposition Plan:  Status is: Inpatient  Dispo: The patient is from: Home              Anticipated d/c is to: Home              Anticipated d/c  date is: > 3 days              Patient currently is not medically stable to d/c. Consultants: GI, general surgery, urology   Procedures: None Antimicrobials: Zosyn  Subjective: Patient resting in bed daughter  by the bedside she is complaining of abdominal discomfort has not had a bowel movement  Objective: Vitals:   05/15/20 0700 05/15/20 0815 05/15/20 0850 05/15/20 1306  BP: (!) 117/44 114/71 118/62 133/75  Pulse: (!) 56 61 (!) 56 (!) 59  Resp: 19 (!) 21 14 19   Temp:    98.8 F (37.1 C)  TempSrc:    Oral  SpO2: 97% 94% 90% 91%  Weight:      Height:        Intake/Output Summary (Last 24 hours) at 05/15/2020 1529 Last data filed at 05/15/2020 0631 Gross per 24 hour  Intake 549 ml  Output --  Net 549 ml   Filed Weights   05/14/20 1434  Weight: 90.7 kg    Examination:  General exam: Appears calm and comfortable  Respiratory system: Clear to auscultation. Respiratory effort normal. Cardiovascular system: S1 & S2 heard, RRR. No JVD, murmurs, rubs, gallops or clicks. No pedal edema. Gastrointestinal system: Abdomen is distended, soft and nontender. No organomegaly or masses felt. Normal bowel sounds heard. Central nervous system: Alert and oriented. No focal neurological deficits. Extremities: Symmetric 5 x 5 power. Skin: No rashes, lesions or ulcers Psychiatry: Judgement and insight appear normal. Mood & affect appropriate.     Data Reviewed: I have personally reviewed following labs and imaging studies  CBC: Recent Labs  Lab 05/14/20 1514 05/15/20 0210  WBC 9.9 9.1  HGB 13.0 10.8*  HCT 39.4 32.4*  MCV 106.8* 106.2*  PLT 261 010   Basic Metabolic Panel: Recent Labs  Lab 05/14/20 1514 05/15/20 0210  NA 136 135  K 4.1 3.7  CL 95* 102  CO2 30 27  GLUCOSE 106* 87  BUN 16 15  CREATININE 1.86* 1.67*  CALCIUM 12.9* 11.2*   GFR: Estimated Creatinine Clearance: 37.5 mL/min (A) (by C-G formula based on SCr of 1.67 mg/dL (H)). Liver Function Tests: Recent Labs  Lab 05/14/20 1514  AST 22  ALT 14  ALKPHOS 72  BILITOT 0.5  PROT 6.4*  ALBUMIN 3.7   Recent Labs  Lab 05/14/20 1514  LIPASE 26   No results for input(s): AMMONIA in the last 168  hours. Coagulation Profile: No results for input(s): INR, PROTIME in the last 168 hours. Cardiac Enzymes: No results for input(s): CKTOTAL, CKMB, CKMBINDEX, TROPONINI in the last 168 hours. BNP (last 3 results) No results for input(s): PROBNP in the last 8760 hours. HbA1C: Recent Labs    05/14/20 1514  HGBA1C 5.4   CBG: Recent Labs  Lab 05/14/20 2156 05/15/20 0805  GLUCAP 81 95   Lipid Profile: No results for input(s): CHOL, HDL, LDLCALC, TRIG, CHOLHDL, LDLDIRECT in the last 72 hours. Thyroid Function Tests: No results for input(s): TSH, T4TOTAL, FREET4, T3FREE, THYROIDAB in the last 72 hours. Anemia Panel: No results for input(s): VITAMINB12, FOLATE, FERRITIN, TIBC, IRON, RETICCTPCT in the last 72 hours. Sepsis Labs: No results for input(s): PROCALCITON, LATICACIDVEN in the last 168 hours.  Recent Results (from the past 240 hour(s))  SARS Coronavirus 2 by RT PCR (hospital order, performed in [facility] hospital lab) Nasopharyngeal Nasopharyngeal Swab     Status: None   Collection Time: 05/14/20  7:18 PM   Specimen: Nasopharyngeal Swab  Result  Value Ref Range Status   SARS Coronavirus 2 NEGATIVE NEGATIVE Final    Comment: (NOTE) SARS-CoV-2 target nucleic acids are NOT DETECTED.  The SARS-CoV-2 RNA is generally detectable in upper and lower respiratory specimens during the acute phase of infection. The lowest concentration of SARS-CoV-2 viral copies this assay can detect is 250 copies / mL. A negative result does not preclude SARS-CoV-2 infection and should not be used as the sole basis for treatment or other patient management decisions.  A negative result may occur with improper specimen collection / handling, submission of specimen other than nasopharyngeal swab, presence of viral mutation(s) within the areas targeted by this assay, and inadequate number of viral copies (<250 copies / mL). A negative result must be combined with clinical observations, patient  history, and epidemiological information.  Fact Sheet for Patients:   StrictlyIdeas.no  Fact Sheet for Healthcare Providers: BankingDealers.co.za  This test is not yet approved or  cleared by the [place] FDA and has been authorized for detection and/or diagnosis of SARS-CoV-2 by FDA under an Emergency Use Authorization (EUA).  This EUA will remain in effect (meaning this test can be used) for the duration of the COVID-19 declaration under Section 564(b)(1) of the Act, 21 U.S.C. section 360bbb-3(b)(1), unless the authorization is terminated or revoked sooner.  Performed at [facility], [place] [address] [place], [place] [address]          Radiology Studies: CT ABDOMEN PELVIS WO CONTRAST  Result Date: 05/14/2020 CLINICAL DATA:  Epigastric pain.  Pain status post fall EXAM: CT ABDOMEN AND PELVIS WITHOUT CONTRAST TECHNIQUE: Multidetector CT imaging of the abdomen and pelvis was performed following the standard protocol without IV contrast. COMPARISON:  None. FINDINGS: Lower chest: The lung bases are clear.  The heart size is normal. Hepatobiliary: The liver is normal. Status post cholecystectomy.There is no biliary ductal dilation. Pancreas: Normal contours without ductal dilatation. No peripancreatic fluid collection. Spleen: Unremarkable. Adrenals/Urinary Tract: --Adrenal glands: Unremarkable. --Right kidney/ureter: No hydronephrosis or radiopaque kidney stones. --Left kidney/ureter: No hydronephrosis or radiopaque kidney stones. --Urinary bladder: There is asymmetric urinary bladder wall thickening. There is a moderate to large volume of air within the urinary bladder. Stomach/Bowel: --Stomach/Duodenum: No hiatal hernia or other gastric abnormality. Normal duodenal course and caliber. --Small bowel: Unremarkable. --Colon: There is a large amount of stool throughout colon. There is a relatively abrupt transition point the  level of the sigmoid colon where there are adjacent inflammatory changes circumferential a wall thickening. There is a questionable contained perforation of the sigmoid colon abutting the urinary bladder, measuring approximately 2.1 cm on the coronal series. This is not amenable to percutaneous drainage given its small size. --Appendix: Not visualized. No right lower quadrant inflammation or free fluid. Vascular/Lymphatic: Normal course and caliber of the major abdominal vessels. --No retroperitoneal lymphadenopathy. --No mesenteric lymphadenopathy. --No pelvic or inguinal lymphadenopathy. Reproductive: Status post hysterectomy. No adnexal mass. Other: There is a small volume of free fluid in the patient's pelvis. The abdominal wall is normal. Musculoskeletal. There is an age-indeterminate compression fracture of the T9 vertebral body with approximately 20% height loss anteriorly. The patient is status post bilateral total hip arthroplasty. There is extensive streak artifact through the patient's pelvis that limits evaluation of this region. IMPRESSION: 1. Large amount of stool throughout the colon with an abrupt transition point at the sigmoid colon. At this location, there is circumferential wall thickening with adjacent inflammatory changes. Findings may be secondary to colitis/diverticulitis. There are findings  suspicious for contained perforation abutting the urinary bladder. However, an underlying mass is not excluded and should be further evaluated with colonoscopy. 2. 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 3. Age-indeterminate compression fracture of the T9 vertebral body. Aortic Atherosclerosis (ICD10-I70.0). Electronically Signed   By: Constance Holster M.D.   On: 05/14/2020 18:29   DG Chest 2 View  Result Date: 05/14/2020 CLINICAL DATA:  Cough. EXAM: CHEST - 2 VIEW COMPARISON:  November 13, 2019.  FINDINGS: The heart size and mediastinal contours are within normal limits. Both lungs are clear. The visualized skeletal structures are unremarkable. IMPRESSION: No active cardiopulmonary disease. Electronically Signed   By: Marijo Conception M.D.   On: 05/14/2020 18:22        Scheduled Meds: . vitamin C  1,000 mg Oral QHS  . B-complex with vitamin C  2 tablet Oral QHS  . cholecalciferol  1,000 Units Oral BID  . docusate sodium  100-200 mg Oral QHS  . donepezil  10 mg Oral Daily  . enoxaparin (LOVENOX) injection  40 mg Subcutaneous Q24H  . escitalopram  30 mg Oral Daily  . hydroxychloroquine  200 mg Oral Daily  . insulin aspart  0-9 Units Subcutaneous TID PC & HS  . lamoTRIgine  150 mg Oral QHS  . lamoTRIgine  75 mg Oral q morning - 10a  . nadolol  10 mg Oral Daily  . pantoprazole  40 mg Oral Daily  . predniSONE  10 mg Oral Daily  . rosuvastatin  5 mg Oral Daily  . traZODone  200 mg Oral QHS   Continuous Infusions: . sodium chloride 100 mL/hr at 05/15/20 1205  . piperacillin-tazobactam (ZOSYN)  IV 3.375 g (05/15/20 1218)     LOS: 1 day     Georgette Shell, MD 05/15/2020, 3:29 PM

## 2020-05-15 NOTE — Progress Notes (Addendum)
Subjective:  Patient feels about the same this morning.  Pain when she voids or moves her bowels also with standing and mobilizing.  Otherwise, pain is controlled when laying still.  She has been having pneumaturia for at least 3 months she tells me this morning.    ROS: See above, otherwise other systems negative  Objective: Vital signs in last 24 hours: Temp:  [98 F (36.7 C)-99.1 F (37.3 C)] 98 F (36.7 C) (09/22 0506) Pulse Rate:  [54-66] 56 (09/22 0850) Resp:  [12-30] 14 (09/22 0850) BP: (87-128)/(41-96) 118/62 (09/22 0850) SpO2:  [87 %-100 %] 90 % (09/22 0850) Weight:  [90.7 kg] 90.7 kg (09/21 1434)    Intake/Output from previous day: 09/21 0701 - 09/22 0700 In: 549 [IV Piggyback:549] Out: -  Intake/Output this shift: No intake/output data recorded.  PE: Heart: regular Lungs: CTAB Abd: soft, mild diffuse tenderness, but no peritonitis, guarding, rebounding, hypoactive BS, ND  Lab Results:  Recent Labs    05/14/20 1514 05/15/20 0210  WBC 9.9 9.1  HGB 13.0 10.8*  HCT 39.4 32.4*  PLT 261 181   BMET Recent Labs    05/14/20 1514 05/15/20 0210  NA 136 135  K 4.1 3.7  CL 95* 102  CO2 30 27  GLUCOSE 106* 87  BUN 16 15  CREATININE 1.86* 1.67*  CALCIUM 12.9* 11.2*   PT/INR No results for input(s): LABPROT, INR in the last 72 hours. CMP     Component Value Date/Time   NA 135 05/15/2020 0210   K 3.7 05/15/2020 0210   CL 102 05/15/2020 0210   CO2 27 05/15/2020 0210   GLUCOSE 87 05/15/2020 0210   BUN 15 05/15/2020 0210   CREATININE 1.67 (H) 05/15/2020 0210   CREATININE 1.73 (H) 02/08/2018 1020   CALCIUM 11.2 (H) 05/15/2020 0210   PROT 6.4 (L) 05/14/2020 1514   ALBUMIN 3.7 05/14/2020 1514   AST 22 05/14/2020 1514   ALT 14 05/14/2020 1514   ALKPHOS 72 05/14/2020 1514   BILITOT 0.5 05/14/2020 1514   GFRNONAA 33 (L) 05/15/2020 0210   GFRNONAA 32 (L) 02/08/2018 1020   GFRAA 38 (L) 05/15/2020 0210   GFRAA 37 (L) 02/08/2018 1020   Lipase      Component Value Date/Time   LIPASE 26 05/14/2020 1514       Studies/Results: CT ABDOMEN PELVIS WO CONTRAST  Result Date: 05/14/2020 CLINICAL DATA:  Epigastric pain.  Pain status post fall EXAM: CT ABDOMEN AND PELVIS WITHOUT CONTRAST TECHNIQUE: Multidetector CT imaging of the abdomen and pelvis was performed following the standard protocol without IV contrast. COMPARISON:  None. FINDINGS: Lower chest: The lung bases are clear.  The heart size is normal. Hepatobiliary: The liver is normal. Status post cholecystectomy.There is no biliary ductal dilation. Pancreas: Normal contours without ductal dilatation. No peripancreatic fluid collection. Spleen: Unremarkable. Adrenals/Urinary Tract: --Adrenal glands: Unremarkable. --Right kidney/ureter: No hydronephrosis or radiopaque kidney stones. --Left kidney/ureter: No hydronephrosis or radiopaque kidney stones. --Urinary bladder: There is asymmetric urinary bladder wall thickening. There is a moderate to large volume of air within the urinary bladder. Stomach/Bowel: --Stomach/Duodenum: No hiatal hernia or other gastric abnormality. Normal duodenal course and caliber. --Small bowel: Unremarkable. --Colon: There is a large amount of stool throughout colon. There is a relatively abrupt transition point the level of the sigmoid colon where there are adjacent inflammatory changes circumferential a wall thickening. There is a questionable contained perforation of the sigmoid colon abutting the urinary bladder, measuring  approximately 2.1 cm on the coronal series. This is not amenable to percutaneous drainage given its small size. --Appendix: Not visualized. No right lower quadrant inflammation or free fluid. Vascular/Lymphatic: Normal course and caliber of the major abdominal vessels. --No retroperitoneal lymphadenopathy. --No mesenteric lymphadenopathy. --No pelvic or inguinal lymphadenopathy. Reproductive: Status post hysterectomy. No adnexal mass. Other: There is a  small volume of free fluid in the patient's pelvis. The abdominal wall is normal. Musculoskeletal. There is an age-indeterminate compression fracture of the T9 vertebral body with approximately 20% height loss anteriorly. The patient is status post bilateral total hip arthroplasty. There is extensive streak artifact through the patient's pelvis that limits evaluation of this region. IMPRESSION: 1. Large amount of stool throughout the colon with an abrupt transition point at the sigmoid colon. At this location, there is circumferential wall thickening with adjacent inflammatory changes. Findings may be secondary to colitis/diverticulitis. There are findings suspicious for contained perforation abutting the urinary bladder. However, an underlying mass is not excluded and should be further evaluated with colonoscopy. 2. 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 3. Age-indeterminate compression fracture of the T9 vertebral body. Aortic Atherosclerosis (ICD10-I70.0). Electronically Signed   By: Constance Holster M.D.   On: 05/14/2020 18:29   DG Chest 2 View  Result Date: 05/14/2020 CLINICAL DATA:  Cough. EXAM: CHEST - 2 VIEW COMPARISON:  November 13, 2019. FINDINGS: The heart size and mediastinal contours are within normal limits. Both lungs are clear. The visualized skeletal structures are unremarkable. IMPRESSION: No active cardiopulmonary disease. Electronically Signed   By: Marijo Conception M.D.   On: 05/14/2020 18:22    Anti-infectives: Anti-infectives (From admission, onward)   Start     Dose/Rate Route Frequency Ordered Stop   05/15/20 1000  hydroxychloroquine (PLAQUENIL) tablet 200 mg        200 mg Oral Daily 05/14/20 1956     05/15/20 0200  piperacillin-tazobactam (ZOSYN) IVPB 3.375 g        3.375 g 12.5 mL/hr over 240 Minutes Intravenous Every 8 hours 05/14/20 2054     05/14/20 2000   piperacillin-tazobactam (ZOSYN) IVPB 3.375 g  Status:  Discontinued        3.375 g 100 mL/hr over 30 Minutes Intravenous Every 6 hours 05/14/20 1956 05/14/20 2054   05/14/20 1915  piperacillin-tazobactam (ZOSYN) IVPB 3.375 g        3.375 g 100 mL/hr over 30 Minutes Intravenous  Once 05/14/20 1902 05/14/20 2359       Assessment/Plan Psoriatic arthritis, Immunosuppressed on prednisone, methotrexate, plaquenil, and Enbrel - last had enbrel and MTX 1 week ago 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  Would hold for now pending further possible interventions CKD OSA DM  Sigmoid colitis with possible microperforation, stricture  The patient has had pneumaturia for the last 3 months and a CT with extensive air in her bladder c/w colovesical fistula.  Her UA also supports this idea as well.  Urology consult is recommended for evaluation.    The patient is significantly immunosuppressed on multiple agents and has a low chance of healing any of these issues on her own without intervention.  We will discuss with our colorectal specialist, but suspicion she will need some type of diverting colostomy this admission +/- resection of diseased area as well vs at a later date.  For now, continue bowel rest, IV abx therapy, and will follow closely for more definitive plans.  FEN - NPO.IVFs VTE - hold plavix, lovwnox ID - zosyn   LOS: 1 day    Henreitta Cea , [facility] Surgery 05/15/2020, 9:09 AM Please see Amion for pager number during day hours 7:00am-4:30pm or 7:00am -11:30am on weekends  Agree with above. Daughter, Gwinda Maine, in room with patient. She's has urologic symptoms since at least May.  Her PCP is Dr. Nat Christen in [place], though she lives in [place]. Urology is in the room for cysto at this time.  Alphonsa Overall, MD, [facility] Surgery Office phone:  [phone]

## 2020-05-15 NOTE — Consult Note (Addendum)
Urology Consult   Physician requesting consult: Dr. Shari Heritage  Reason for consult: Colovesicular fistula  History of Present Illness: Valerie [name] is a 59 y.o. female seen in consultation for colovesicular fistula.  She underwent a CT abdomen pelvis noncontrast on 05/14/2020 with diverticulitis with concern for contained perforation adjacent to the bladder as well as a concern for colovesicular fistula.   She presented to the ED on 05/14/2020 with acute onset of abdominal pain associated constipation for about a week  and some intermittent nausea and vomiting. She states that over the past 2 months she has noticed pneumaturia and passing sediment in her urine.  She notes that her urine has been cloudy with intermittent foul-smelling.  She has been treated 4 times for a urinary tract infection by her PCP.  She denies gross hematuria.  She denies dysuria presently.  She does note some urinary frequency and urgency. She has noticed suprapubic discomfort. She has a significant smoking history approximately pack a day for 40 years.  She denies a history of urolithiasis, GU malignancy/trauma/surgery.    Past Medical History:  Diagnosis Date  . Anxiety   . Arthritis    Psoriatic  . Bipolar disorder ([place])   . CKD (chronic kidney disease), stage III   . Depression   . DM type 2 (diabetes mellitus, type 2) ([place]) 11/14/2019  . Neuropathy   . NSTEMI (non-ST elevated myocardial infarction) ([place]) 11/14/2019  . OSA (obstructive sleep apnea)   . Psoriatic arthritis ([place])   . Sleep apnea     Past Surgical History:  Procedure Laterality Date  . LEFT HEART CATH AND CORONARY ANGIOGRAPHY N/A 11/14/2019   Procedure: LEFT HEART CATH AND CORONARY ANGIOGRAPHY;  Surgeon: Martinique, Peter M, MD;  Location: [facility];  Service: Cardiovascular;  Laterality: N/A;     Current Hospital Medications:  Home meds:  No current facility-administered medications on file prior to encounter.   Current  Outpatient Medications on File Prior to Encounter  Medication Sig Dispense Refill  . ALPRAZolam (XANAX) 1 MG tablet Take 1 mg by mouth 3 (three) times daily.    . Ascorbic Acid (VITAMIN C) 1000 MG tablet Take 1,000 mg by mouth at bedtime.    Marland Kitchen aspirin EC 81 MG tablet Take 1 tablet (81 mg total) by mouth daily.    Marland Kitchen b complex vitamins tablet Take 2 tablets by mouth at bedtime.    . Cholecalciferol (VITAMIN D-3) 1000 units CAPS Take 1,000 Units by mouth 2 (two) times daily.     Marland Kitchen docusate sodium (COLACE) 100 MG capsule Take 100-200 mg by mouth at bedtime.    . donepezil (ARICEPT) 10 MG tablet Take 10 mg by mouth daily.    Marland Kitchen escitalopram (LEXAPRO) 20 MG tablet Take 30 mg by mouth daily.   0  . etanercept (ENBREL) 50 MG/ML injection Inject 50 mg into the skin every Saturday.    . hydroxychloroquine (PLAQUENIL) 200 MG tablet Take 200 mg by mouth daily.     Marland Kitchen lamoTRIgine (LAMICTAL) 150 MG tablet Take 75-150 mg by mouth See admin instructions. Take 1/2 tablet (75 mg) by mouth every morning and 1 tablet (150 mg) at night  0  . levocetirizine (XYZAL) 5 MG tablet Take 5 mg by mouth daily.    . methotrexate (RHEUMATREX) 2.5 MG tablet Take 10 mg by mouth every Monday.    . nadolol (CORGARD) 20 MG tablet Take 0.5 tablets (10 mg total) by mouth daily. 90 tablet 1  .  nitroGLYCERIN (NITROSTAT) 0.4 MG SL tablet Place 1 tablet (0.4 mg total) under the tongue every 5 (five) minutes as needed for chest pain. 30 tablet 0  . omeprazole (PRILOSEC) 20 MG capsule Take 20 mg by mouth daily.     . predniSONE (DELTASONE) 10 MG tablet Take 10 mg by mouth daily.     . rizatriptan (MAXALT-MLT) 10 MG disintegrating tablet Take 10 mg by mouth See admin instructions. Take one tablet (10 mg) by mouth daily as needed for "floaters", May repeat in 2 hours if still needed    . rosuvastatin (CRESTOR) 5 MG tablet Take 1 tablet (5 mg total) by mouth daily. 90 tablet 3  . traZODone (DESYREL) 100 MG tablet Take 200 mg by mouth at  bedtime.    . valACYclovir (VALTREX) 500 MG tablet Take 1 tablet by mouth 2 (two) times a week.    . clopidogrel (PLAVIX) 75 MG tablet Take 75 mg by mouth daily.    Marland Kitchen HYDROcodone-acetaminophen (NORCO) 10-325 MG tablet Take 1 tablet by mouth 2 (two) times daily as needed (pain).        Scheduled Meds: . vitamin C  1,000 mg Oral QHS  . B-complex with vitamin C  2 tablet Oral QHS  . cholecalciferol  1,000 Units Oral BID  . docusate sodium  100-200 mg Oral QHS  . donepezil  10 mg Oral Daily  . enoxaparin (LOVENOX) injection  40 mg Subcutaneous Q24H  . escitalopram  30 mg Oral Daily  . hydroxychloroquine  200 mg Oral Daily  . insulin aspart  0-9 Units Subcutaneous TID PC & HS  . lamoTRIgine  150 mg Oral QHS  . lamoTRIgine  75 mg Oral q morning - 10a  . nadolol  10 mg Oral Daily  . pantoprazole  40 mg Oral Daily  . predniSONE  10 mg Oral Daily  . rosuvastatin  5 mg Oral Daily  . traZODone  200 mg Oral QHS   Continuous Infusions: . sodium chloride 100 mL/hr at 05/15/20 1205  . piperacillin-tazobactam (ZOSYN)  IV 3.375 g (05/15/20 1218)   PRN Meds:.acetaminophen **OR** acetaminophen, ALPRAZolam, HYDROcodone-acetaminophen, magnesium hydroxide, nitroGLYCERIN, olopatadine, ondansetron **OR** ondansetron (ZOFRAN) IV, SUMAtriptan, traZODone  Allergies:  Allergies  Allergen Reactions  . Carbamazepine Other (See Comments)    Parkinsons like symptoms tremors  . Sertraline Hcl Other (See Comments)    Unknown reaction    Family History  Problem Relation Age of Onset  . Breast cancer Neg Hx     Social History:  reports that she has been smoking. She has never used smokeless tobacco. No history on file for alcohol use and drug use.  ROS: A complete review of systems was performed.  All systems are negative except for pertinent findings as noted.  Physical Exam:  Vital signs in last 24 hours: Temp:  [98 F (36.7 C)-99.1 F (37.3 C)] 98 F (36.7 C) (09/22 0506) Pulse Rate:  [54-66]  56 (09/22 0850) Resp:  [12-30] 14 (09/22 0850) BP: (87-128)/(41-96) 118/62 (09/22 0850) SpO2:  [87 %-100 %] 90 % (09/22 0850) Weight:  [90.7 kg] 90.7 kg (09/21 1434) Constitutional:  Alert and oriented, No acute distress Cardiovascular: Regular rate and rhythm Respiratory: Normal respiratory effort, Lungs clear bilaterally GI: Abdomen is soft, nontender, nondistended, no abdominal masses, obese GU: No CVA tenderness; no anterior vaginal mass. Neurologic: Grossly intact, no focal deficits Psychiatric: Normal mood and affect  Laboratory Data:  Recent Labs    05/14/20 1514 05/15/20 0210  WBC  9.9 9.1  HGB 13.0 10.8*  HCT 39.4 32.4*  PLT 261 181    Recent Labs    05/14/20 1514 05/15/20 0210  NA 136 135  K 4.1 3.7  CL 95* 102  GLUCOSE 106* 87  BUN 16 15  CALCIUM 12.9* 11.2*  CREATININE 1.86* 1.67*     Results for orders placed or performed during the hospital encounter of 05/14/20 (from the past 24 hour(s))  Lipase, blood     Status: None   Collection Time: 05/14/20  3:14 PM  Result Value Ref Range   Lipase 26 11 - 51 U/L  Comprehensive metabolic panel     Status: Abnormal   Collection Time: 05/14/20  3:14 PM  Result Value Ref Range   Sodium 136 135 - 145 mmol/L   Potassium 4.1 3.5 - 5.1 mmol/L   Chloride 95 (L) 98 - 111 mmol/L   CO2 30 22 - 32 mmol/L   Glucose, Bld 106 (H) 70 - 99 mg/dL   BUN 16 6 - 20 mg/dL   Creatinine, Ser 1.86 (H) 0.44 - 1.00 mg/dL   Calcium 12.9 (H) 8.9 - 10.3 mg/dL   Total Protein 6.4 (L) 6.5 - 8.1 g/dL   Albumin 3.7 3.5 - 5.0 g/dL   AST 22 15 - 41 U/L   ALT 14 0 - 44 U/L   Alkaline Phosphatase 72 38 - 126 U/L   Total Bilirubin 0.5 0.3 - 1.2 mg/dL   GFR calc non Af Amer 29 (L) >60 mL/min   GFR calc Af Amer 33 (L) >60 mL/min   Anion gap 11 5 - 15  CBC     Status: Abnormal   Collection Time: 05/14/20  3:14 PM  Result Value Ref Range   WBC 9.9 4.0 - 10.5 K/uL   RBC 3.69 (L) 3.87 - 5.11 MIL/uL   Hemoglobin 13.0 12.0 - 15.0 g/dL   HCT  39.4 36 - 46 %   MCV 106.8 (H) 80.0 - 100.0 fL   MCH 35.2 (H) 26.0 - 34.0 pg   MCHC 33.0 30.0 - 36.0 g/dL   RDW 16.6 (H) 11.5 - 15.5 %   Platelets 261 150 - 400 K/uL   nRBC 0.0 0.0 - 0.2 %  Hemoglobin A1c     Status: None   Collection Time: 05/14/20  3:14 PM  Result Value Ref Range   Hgb A1c MFr Bld 5.4 4.8 - 5.6 %   Mean Plasma Glucose 108.28 mg/dL  SARS Coronavirus 2 by RT PCR (hospital order, performed in [facility] hospital lab) Nasopharyngeal Nasopharyngeal Swab     Status: None   Collection Time: 05/14/20  7:18 PM   Specimen: Nasopharyngeal Swab  Result Value Ref Range   SARS Coronavirus 2 NEGATIVE NEGATIVE  CBG monitoring, ED     Status: None   Collection Time: 05/14/20  9:56 PM  Result Value Ref Range   Glucose-Capillary 81 70 - 99 mg/dL  Urinalysis, Routine w reflex microscopic     Status: Abnormal   Collection Time: 05/14/20 10:56 PM  Result Value Ref Range   Color, Urine YELLOW YELLOW   APPearance CLOUDY (A) CLEAR   Specific Gravity, Urine 1.010 1.005 - 1.030   pH 5.0 5.0 - 8.0   Glucose, UA NEGATIVE NEGATIVE mg/dL   Hgb urine dipstick MODERATE (A) NEGATIVE   Bilirubin Urine NEGATIVE NEGATIVE   Ketones, ur NEGATIVE NEGATIVE mg/dL   Protein, ur 100 (A) NEGATIVE mg/dL   Nitrite NEGATIVE NEGATIVE  Leukocytes,Ua LARGE (A) NEGATIVE   RBC / HPF 11-20 0 - 5 RBC/hpf   WBC, UA >50 (H) 0 - 5 WBC/hpf   Bacteria, UA RARE (A) NONE SEEN   Squamous Epithelial / LPF 6-10 0 - 5   WBC Clumps PRESENT    Mucus PRESENT   Basic metabolic panel     Status: Abnormal   Collection Time: 05/15/20  2:10 AM  Result Value Ref Range   Sodium 135 135 - 145 mmol/L   Potassium 3.7 3.5 - 5.1 mmol/L   Chloride 102 98 - 111 mmol/L   CO2 27 22 - 32 mmol/L   Glucose, Bld 87 70 - 99 mg/dL   BUN 15 6 - 20 mg/dL   Creatinine, Ser 1.67 (H) 0.44 - 1.00 mg/dL   Calcium 11.2 (H) 8.9 - 10.3 mg/dL   GFR calc non Af Amer 33 (L) >60 mL/min   GFR calc Af Amer 38 (L) >60 mL/min   Anion gap 6 5 - 15   CBC     Status: Abnormal   Collection Time: 05/15/20  2:10 AM  Result Value Ref Range   WBC 9.1 4.0 - 10.5 K/uL   RBC 3.05 (L) 3.87 - 5.11 MIL/uL   Hemoglobin 10.8 (L) 12.0 - 15.0 g/dL   HCT 32.4 (L) 36 - 46 %   MCV 106.2 (H) 80.0 - 100.0 fL   MCH 35.4 (H) 26.0 - 34.0 pg   MCHC 33.3 30.0 - 36.0 g/dL   RDW 16.3 (H) 11.5 - 15.5 %   Platelets 181 150 - 400 K/uL   nRBC 0.0 0.0 - 0.2 %  CBG monitoring, ED     Status: None   Collection Time: 05/15/20  8:05 AM  Result Value Ref Range   Glucose-Capillary 95 70 - 99 mg/dL   Recent Results (from the past 240 hour(s))  SARS Coronavirus 2 by RT PCR (hospital order, performed in [facility] hospital lab) Nasopharyngeal Nasopharyngeal Swab     Status: None   Collection Time: 05/14/20  7:18 PM   Specimen: Nasopharyngeal Swab  Result Value Ref Range Status   SARS Coronavirus 2 NEGATIVE NEGATIVE Final    Comment: (NOTE) SARS-CoV-2 target nucleic acids are NOT DETECTED.  The SARS-CoV-2 RNA is generally detectable in upper and lower respiratory specimens during the acute phase of infection. The lowest concentration of SARS-CoV-2 viral copies this assay can detect is 250 copies / mL. A negative result does not preclude SARS-CoV-2 infection and should not be used as the sole basis for treatment or other patient management decisions.  A negative result may occur with improper specimen collection / handling, submission of specimen other than nasopharyngeal swab, presence of viral mutation(s) within the areas targeted by this assay, and inadequate number of viral copies (<250 copies / mL). A negative result must be combined with clinical observations, patient history, and epidemiological information.  Fact Sheet for Patients:   StrictlyIdeas.no  Fact Sheet for Healthcare Providers: BankingDealers.co.za  This test is not yet approved or  cleared by the [place] FDA and has been authorized for  detection and/or diagnosis of SARS-CoV-2 by FDA under an Emergency Use Authorization (EUA).  This EUA will remain in effect (meaning this test can be used) for the duration of the COVID-19 declaration under Section 564(b)(1) of the Act, 21 U.S.C. section 360bbb-3(b)(1), unless the authorization is terminated or revoked sooner.  Performed at [facility], [place] [address], [place], [place] [address]  Renal Function: Recent Labs    05/14/20 1514 05/15/20 0210  CREATININE 1.86* 1.67*   Estimated Creatinine Clearance: 37.5 mL/min (A) (by C-G formula based on SCr of 1.67 mg/dL (H)).  Radiologic Imaging: CT ABDOMEN PELVIS WO CONTRAST  Result Date: 05/14/2020 CLINICAL DATA:  Epigastric pain.  Pain status post fall EXAM: CT ABDOMEN AND PELVIS WITHOUT CONTRAST TECHNIQUE: Multidetector CT imaging of the abdomen and pelvis was performed following the standard protocol without IV contrast. COMPARISON:  None. FINDINGS: Lower chest: The lung bases are clear.  The heart size is normal. Hepatobiliary: The liver is normal. Status post cholecystectomy.There is no biliary ductal dilation. Pancreas: Normal contours without ductal dilatation. No peripancreatic fluid collection. Spleen: Unremarkable. Adrenals/Urinary Tract: --Adrenal glands: Unremarkable. --Right kidney/ureter: No hydronephrosis or radiopaque kidney stones. --Left kidney/ureter: No hydronephrosis or radiopaque kidney stones. --Urinary bladder: There is asymmetric urinary bladder wall thickening. There is a moderate to large volume of air within the urinary bladder. Stomach/Bowel: --Stomach/Duodenum: No hiatal hernia or other gastric abnormality. Normal duodenal course and caliber. --Small bowel: Unremarkable. --Colon: There is a large amount of stool throughout colon. There is a relatively abrupt transition point the level of the sigmoid colon where there are adjacent inflammatory changes circumferential a wall thickening.  There is a questionable contained perforation of the sigmoid colon abutting the urinary bladder, measuring approximately 2.1 cm on the coronal series. This is not amenable to percutaneous drainage given its small size. --Appendix: Not visualized. No right lower quadrant inflammation or free fluid. Vascular/Lymphatic: Normal course and caliber of the major abdominal vessels. --No retroperitoneal lymphadenopathy. --No mesenteric lymphadenopathy. --No pelvic or inguinal lymphadenopathy. Reproductive: Status post hysterectomy. No adnexal mass. Other: There is a small volume of free fluid in the patient's pelvis. The abdominal wall is normal. Musculoskeletal. There is an age-indeterminate compression fracture of the T9 vertebral body with approximately 20% height loss anteriorly. The patient is status post bilateral total hip arthroplasty. There is extensive streak artifact through the patient's pelvis that limits evaluation of this region. IMPRESSION: 1. Large amount of stool throughout the colon with an abrupt transition point at the sigmoid colon. At this location, there is circumferential wall thickening with adjacent inflammatory changes. Findings may be secondary to colitis/diverticulitis. There are findings suspicious for contained perforation abutting the urinary bladder. However, an underlying mass is not excluded and should be further evaluated with colonoscopy. 2. 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 3. Age-indeterminate compression fracture of the T9 vertebral body. Aortic Atherosclerosis (ICD10-I70.0). Electronically Signed   By: Constance Holster M.D.   On: 05/14/2020 18:29   DG Chest 2 View  Result Date: 05/14/2020 CLINICAL DATA:  Cough. EXAM: CHEST - 2 VIEW COMPARISON:  November 13, 2019. FINDINGS: The heart size and mediastinal contours are within normal limits. Both lungs are clear. The visualized  skeletal structures are unremarkable. IMPRESSION: No active cardiopulmonary disease. Electronically Signed   By: Marijo Conception M.D.   On: 05/14/2020 18:22    I independently reviewed the above imaging studies.  Impression/Recommendation: 1. Colovesicular fistula: CT abdomen pelvis on 05/14/2020 with apparent colovesicular fistula with large amount of air in the bladder and adjacent diverticulitis with a contained perforation.  Complaints of pneumaturia and sediment in her urine for 3 months. a. Obtain urine culture b. Cystoscopy today with a significant amount of debris within the bladder however no evidence of a large bladder mass.  Of note cystoscopy was limited given the amount of debris present.  Unable to visualize fistula however significant amount of debris present limiting this study. c. Recommend colonoscopy to be performed by GI or general surgery to evaluate for underlying colonic mass. d. We will likely need surgical intervention, likely a bowel resection with anastomosis.  Urology can be available for bladder repair and possible omental interposition.  Will defer to general surgery for need for diverting colostomy prior. e. Leave Foley catheter to gravity for now. f. Continue zosyn g. We will plan to repeat cystoscopy after Foley catheter drainage to clear debris. Can be done outpatient or in OR just prior to intervention. 2. Microscopic hematuria: UA 9/21 with 11-20 RBC a. Cystoscopy today limited by debris in bladder. Will plan to repeat after foley drainage. Can be done outpatient or just prior to OR intervention. b. Given significant smoking history (40 pack years) will need to evaluate upper tracts. GFR only 33. Hold off on CT Urogram for now. Could evaluate with cystoscopy and bilateral retrograde pyelograms later.  Janith Lima 05/15/2020, 12:53 PM  [facility]  Pager: [phone]   CC: Jacki Cones, Alphonsa Overall

## 2020-05-15 NOTE — Progress Notes (Addendum)
Hypoglycemic Event  CBG: 66  Treatment: Patient is NPO. RN paged MD and received orders to discontinue NS and administer D5NS @100ml  continuous.   Symptoms:Asymptomatic  Follow-up CBG Result: 80   Possible Reasons for Event:NPO  Comments/MD notified:See [place]

## 2020-05-15 NOTE — Telephone Encounter (Signed)
New Message:      Pt says she is in [facility]. She says she needs a lab order for her to get her Lipid drawn.

## 2020-05-15 NOTE — Telephone Encounter (Signed)
Attempted to call patient. Left message for patient to call back.

## 2020-05-15 NOTE — Progress Notes (Signed)
Pharmacy Note    When any of the following medications are ordered by a non-oncologist:   1.   A pharmacist will review the inpatient medical record for:  a.   Abnormal Lab Values: -  CBC  - CrCl  - LFTs b.   Fever   (temperature   ?100.5 F) or other signs of infection c.   Other pertinent data   2.   If fever, other signs of infection, or any of the medication-specific conditions in the checklist below are identified, the medication will be held until physician review occurs as described in the oral chemotherapy policy.   3.   If patient meets criteria to proceed after checklist screening, before dispensing the medication the pharmacist will also: a.   Review for potential drug interactions  b.   Review need for dosage adjustment for organ dysfunction    Therefore will hold methotrexate    Royetta Asal, PharmD, BCPS 05/15/2020 6:02 AM

## 2020-05-15 NOTE — Telephone Encounter (Signed)
Patient returning a call from [place]. Please advise.

## 2020-05-15 NOTE — Telephone Encounter (Signed)
Spoke with the patient who states that she has received messages about getting her lipids drawn. She states that she is currently in the hospital. I advised her that we can wait until she is out of the hospital to have her cholesterol checked, but if they would like to draw them in the hospital then that would be fine.

## 2020-05-15 NOTE — ED Notes (Signed)
Report called to [place], Therapist, sports.

## 2020-05-16 LAB — URINE CULTURE

## 2020-05-16 LAB — GLUCOSE, CAPILLARY
Glucose-Capillary: 92 mg/dL (ref 70–99)
Glucose-Capillary: 88 mg/dL (ref 70–99)
Glucose-Capillary: 114 mg/dL — ABNORMAL HIGH (ref 70–99)
Glucose-Capillary: 115 mg/dL — ABNORMAL HIGH (ref 70–99)

## 2020-05-16 LAB — PREALBUMIN: Prealbumin: 12.4 mg/dL — ABNORMAL LOW (ref 18–38)

## 2020-05-16 MED ORDER — LIP MEDEX EX OINT
TOPICAL_OINTMENT | CUTANEOUS | Status: DC | PRN
  Filled 2020-05-16: qty 7

## 2020-05-16 NOTE — Progress Notes (Signed)
PROGRESS NOTE    Valerie [name]  BSJ:[id number] DOB: 1960/10/18 DOA: 05/14/2020 PCP: Ophelia Shoulder, MD  Brief Narrative:Valerie [name]  is a 59 y.o. Caucasian female with a known history of type 2 diabetes mellitus, peripheral neuropathy, stage III chronic kidney disease, obstructive sleep apnea, coronary artery disease and psoriatic arthritis, presented to the emergency room with acute onset of abdominal pain with associated constipation for about a week for which she took prune juice, Dulcolax and Colace with only small bowel movements and later had nausea and vomiting.  Prior to that the patient had a fall about 3 weeks ago and was given pain medications including oxycodone for left sided rib pain and upper abdominal pain and dose were thought to be the culprit for her constipation.  No dyspnea or cough or wheezing.  No dysuria, oliguria or hematuria or flank pain.  No chest pain or palpitations. She has not been vaccinated for COVID-19 however she is enrolled in a trial with [employer] since January that apparently involves intrusion intake. No recent exposure to COVID-19. She has upper and lower extremity bruising that she relates to her neuropathy and partly due to her fall.  Upon presentation to the emergency room, blood pressure was 97/72 with otherwise normal vital signs.  Labs revealed a creatinine of 1.86 close to baseline with a BUN of 16, calcium was 12.9 with albumin of 3.7 with otherwise unremarkable CMP.  CBC showed relative macrocytosis.  Abdominal pelvic CT scan showed the following:  1. Large amount of stool throughout the colon with an abrupt transition point at the sigmoid colon. At this location, there is circumferential wall thickening with adjacent inflammatory changes. Findings may be secondary to colitis/diverticulitis. There are findings suspicious for contained perforation abutting the urinary bladder. However, an underlying mass is not excluded and should be further  evaluated with colonoscopy. 2. 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 3. 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  Plan to redo cystoscopy. I discussed with GI they advised to do a colonoscopy as an outpatient since this patient has perforation with acute diverticulitis they are not keen on doing a colonoscopy at this time.  CT abdomen pelvis-Large amount of stool throughout the colon with an abrupt transition point at the sigmoid colon. At this location, there is circumferential wall thickening with adjacent inflammatory changes. Findings may be secondary to colitis/diverticulitis. There are findings suspicious for contained perforation abutting the urinary bladder. However, an underlying mass is not excluded and should be further evaluated with colonoscopy. 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 Age-indeterminate compression fracture of the T9 vertebral body Surgery following Urology consulted GI consulted Continue IV Zosyn n.p.o. and IV fluids  #2 type 2 diabetes-patient is n.p.o. continue SSI  #3 history of stroke on Plavix  #4 history of psoriatic arthritis on multiple meds including methotrexate Plaquenil and Enbrel   Estimated body mass index is 36.58 kg/m as calculated from the following:   Height as of this encounter: 5\' 2"  (1.575 m).   Weight as of this encounter: 90.7 kg.  DVT prophylaxis: scd Code Status: full Family  Communication:dw daughter Disposition Plan:  Status is: Inpatient  Dispo: The patient is from:  Home              Anticipated d/c is to: Home              Anticipated d/c date is: > 3 days              Patient currently is not medically stable to d/c. Consultants: GI, general surgery, urology   Procedures: None Antimicrobials: Zosyn  Subjective: Patient resting in bed daughter by the bedside she is complaining of abdominal discomfort has not had a bowel movement  Objective: Vitals:   05/15/20 1719 05/15/20 2057 05/16/20 0602 05/16/20 1029  BP: (!) 115/56 100/60 (!) 110/54 119/63  Pulse: 66 (!) 59 (!) 49 (!) 55  Resp: 15 17 17    Temp: 98.8 F (37.1 C) 99 F (37.2 C) 98 F (36.7 C)   TempSrc: Oral Oral Oral   SpO2: 90% (!) 89% 96%   Weight:      Height:        Intake/Output Summary (Last 24 hours) at 05/16/2020 1242 Last data filed at 05/16/2020 0552 Gross per 24 hour  Intake 1471.35 ml  Output 975 ml  Net 496.35 ml   Filed Weights   05/14/20 1434  Weight: 90.7 kg    Examination:  General exam: Appears calm and comfortable  Respiratory system: Clear to auscultation. Respiratory effort normal. Cardiovascular system: S1 & S2 heard, RRR. No JVD, murmurs, rubs, gallops or clicks. No pedal edema. Gastrointestinal system: Abdomen is distended, soft and nontender. No organomegaly or masses felt. Normal bowel sounds heard. Central nervous system: Alert and oriented. No focal neurological deficits. Extremities: Symmetric 5 x 5 power. Skin: No rashes, lesions or ulcers Psychiatry: Judgement and insight appear normal. Mood & affect appropriate.     Data Reviewed: I have personally reviewed following labs and imaging studies  CBC: Recent Labs  Lab 05/14/20 1514 05/15/20 0210  WBC 9.9 9.1  HGB 13.0 10.8*  HCT 39.4 32.4*  MCV 106.8* 106.2*  PLT 261 259   Basic Metabolic Panel: Recent Labs  Lab 05/14/20 1514 05/15/20 0210  NA 136 135  K 4.1 3.7  CL 95* 102  CO2 30 27  GLUCOSE 106* 87  BUN 16 15  CREATININE 1.86* 1.67*  CALCIUM 12.9* 11.2*    GFR: Estimated Creatinine Clearance: 37.5 mL/min (A) (by C-G formula based on SCr of 1.67 mg/dL (H)). Liver Function Tests: Recent Labs  Lab 05/14/20 1514  AST 22  ALT 14  ALKPHOS 72  BILITOT 0.5  PROT 6.4*  ALBUMIN 3.7   Recent Labs  Lab 05/14/20 1514  LIPASE 26   No results for input(s): AMMONIA in the last 168 hours. Coagulation Profile: No results for input(s): INR, PROTIME in the last 168 hours. Cardiac Enzymes: No results for input(s): CKTOTAL, CKMB, CKMBINDEX, TROPONINI in the last 168 hours. BNP (last 3 results) No results for input(s): PROBNP in the last 8760 hours. HbA1C: Recent Labs    05/14/20 1514  HGBA1C 5.4   CBG: Recent Labs  Lab 05/15/20 1656 05/15/20 1844 05/15/20 2219 05/16/20 0726 05/16/20 1214  GLUCAP 66* 80 91 92 88   Lipid Profile: No results for input(s): CHOL, HDL, LDLCALC, TRIG, CHOLHDL, LDLDIRECT in the last 72 hours. Thyroid Function Tests: No results for input(s): TSH, T4TOTAL, FREET4, T3FREE, THYROIDAB in the last 72 hours. Anemia Panel: No results for input(s): VITAMINB12, FOLATE, FERRITIN, TIBC, IRON, RETICCTPCT  in the last 72 hours. Sepsis Labs: No results for input(s): PROCALCITON, LATICACIDVEN in the last 168 hours.  Recent Results (from the past 240 hour(s))  SARS Coronavirus 2 by RT PCR (hospital order, performed in [facility] hospital lab) Nasopharyngeal Nasopharyngeal Swab     Status: None   Collection Time: 05/14/20  7:18 PM   Specimen: Nasopharyngeal Swab  Result Value Ref Range Status   SARS Coronavirus 2 NEGATIVE NEGATIVE Final    Comment: (NOTE) SARS-CoV-2 target nucleic acids are NOT DETECTED.  The SARS-CoV-2 RNA is generally detectable in upper and lower respiratory specimens during the acute phase of infection. The lowest concentration of SARS-CoV-2 viral copies this assay can detect is 250 copies / mL. A negative result does not preclude SARS-CoV-2 infection and should not be used as the sole basis for  treatment or other patient management decisions.  A negative result may occur with improper specimen collection / handling, submission of specimen other than nasopharyngeal swab, presence of viral mutation(s) within the areas targeted by this assay, and inadequate number of viral copies (<250 copies / mL). A negative result must be combined with clinical observations, patient history, and epidemiological information.  Fact Sheet for Patients:   StrictlyIdeas.no  Fact Sheet for Healthcare Providers: BankingDealers.co.za  This test is not yet approved or  cleared by the [place] FDA and has been authorized for detection and/or diagnosis of SARS-CoV-2 by FDA under an Emergency Use Authorization (EUA).  This EUA will remain in effect (meaning this test can be used) for the duration of the COVID-19 declaration under Section 564(b)(1) of the Act, 21 U.S.C. section 360bbb-3(b)(1), unless the authorization is terminated or revoked sooner.  Performed at [facility], [place] [address], [place], [place] [address]          Radiology Studies: CT ABDOMEN PELVIS WO CONTRAST  Result Date: 05/14/2020 CLINICAL DATA:  Epigastric pain.  Pain status post fall EXAM: CT ABDOMEN AND PELVIS WITHOUT CONTRAST TECHNIQUE: Multidetector CT imaging of the abdomen and pelvis was performed following the standard protocol without IV contrast. COMPARISON:  None. FINDINGS: Lower chest: The lung bases are clear.  The heart size is normal. Hepatobiliary: The liver is normal. Status post cholecystectomy.There is no biliary ductal dilation. Pancreas: Normal contours without ductal dilatation. No peripancreatic fluid collection. Spleen: Unremarkable. Adrenals/Urinary Tract: --Adrenal glands: Unremarkable. --Right kidney/ureter: No hydronephrosis or radiopaque kidney stones. --Left kidney/ureter: No hydronephrosis or radiopaque kidney stones. --Urinary  bladder: There is asymmetric urinary bladder wall thickening. There is a moderate to large volume of air within the urinary bladder. Stomach/Bowel: --Stomach/Duodenum: No hiatal hernia or other gastric abnormality. Normal duodenal course and caliber. --Small bowel: Unremarkable. --Colon: There is a large amount of stool throughout colon. There is a relatively abrupt transition point the level of the sigmoid colon where there are adjacent inflammatory changes circumferential a wall thickening. There is a questionable contained perforation of the sigmoid colon abutting the urinary bladder, measuring approximately 2.1 cm on the coronal series. This is not amenable to percutaneous drainage given its small size. --Appendix: Not visualized. No right lower quadrant inflammation or free fluid. Vascular/Lymphatic: Normal course and caliber of the major abdominal vessels. --No retroperitoneal lymphadenopathy. --No mesenteric lymphadenopathy. --No pelvic or inguinal lymphadenopathy. Reproductive: Status post hysterectomy. No adnexal mass. Other: There is a small volume of free fluid in the patient's pelvis. The abdominal wall is normal. Musculoskeletal. There is an age-indeterminate compression fracture of the T9 vertebral body with approximately 20% height  loss anteriorly. The patient is status post bilateral total hip arthroplasty. There is extensive streak artifact through the patient's pelvis that limits evaluation of this region. IMPRESSION: 1. Large amount of stool throughout the colon with an abrupt transition point at the sigmoid colon. At this location, there is circumferential wall thickening with adjacent inflammatory changes. Findings may be secondary to colitis/diverticulitis. There are findings suspicious for contained perforation abutting the urinary bladder. However, an underlying mass is not excluded and should be further evaluated with colonoscopy. 2. Findings highly suspicious for a colovesicular fistula  given the presence of a moderate to large amount of air within the urinary bladder. 3. Age-indeterminate compression fracture of the T9 vertebral body. Aortic Atherosclerosis (ICD10-I70.0). Electronically Signed   By: Constance Holster M.D.   On: 05/14/2020 18:29   DG Chest 2 View  Result Date: 05/14/2020 CLINICAL DATA:  Cough. EXAM: CHEST - 2 VIEW COMPARISON:  November 13, 2019. FINDINGS: The heart size and mediastinal contours are within normal limits. Both lungs are clear. The visualized skeletal structures are unremarkable. IMPRESSION: No active cardiopulmonary disease. Electronically Signed   By: Marijo Conception M.D.   On: 05/14/2020 18:22        Scheduled Meds:  Chlorhexidine Gluconate Cloth  6 each Topical Daily   docusate sodium  100-200 mg Oral QHS   hydroxychloroquine  200 mg Oral Daily   insulin aspart  0-9 Units Subcutaneous TID PC & HS   lamoTRIgine  150 mg Oral QHS   lamoTRIgine  75 mg Oral q morning - 10a   nadolol  10 mg Oral Daily   pantoprazole  40 mg Oral Daily   predniSONE  10 mg Oral Daily   traZODone  200 mg Oral QHS   Continuous Infusions:  dextrose 5 % and 0.9% NaCl 100 mL/hr at 05/16/20 1032   piperacillin-tazobactam (ZOSYN)  IV 3.375 g (05/16/20 1044)     LOS: 2 days     Georgette Shell, MD 05/16/2020, 12:42 PM

## 2020-05-16 NOTE — Plan of Care (Signed)
Pt VS WNL this  am.  No complaints at this time.   Problem: Education: Goal: Knowledge of General Education information will improve Description: Including pain rating scale, medication(s)/side effects and non-pharmacologic comfort measures Outcome: Progressing   Problem: Health Behavior/Discharge Planning: Goal: Ability to manage health-related needs will improve Outcome: Progressing   Problem: Clinical Measurements: Goal: Ability to maintain clinical measurements within normal limits will improve Outcome: Progressing Goal: Will remain free from infection Outcome: Progressing Goal: Diagnostic test results will improve Outcome: Progressing Goal: Respiratory complications will improve Outcome: Progressing Goal: Cardiovascular complication will be avoided Outcome: Progressing   Problem: Coping: Goal: Level of anxiety will decrease Outcome: Progressing   Problem: Elimination: Goal: Will not experience complications related to bowel motility Outcome: Progressing Goal: Will not experience complications related to urinary retention Outcome: Progressing   Problem: Pain Managment: Goal: General experience of comfort will improve Outcome: Progressing   Problem: Safety: Goal: Ability to remain free from injury will improve Outcome: Progressing   Problem: Skin Integrity: Goal: Risk for impaired skin integrity will decrease Outcome: Progressing

## 2020-05-16 NOTE — Progress Notes (Addendum)
GM:[id number] pain after fall, nausea and vomiting x 3 days  Subjective:  She says her only discomfort is her bladder, foley is in.  No nausea or vomiting.  Patient reports her mobility is limited due to her peripheral neuropathy.  She has had multiple falls.  Since November 2020 she has been limited to a cane, walker, and wheelchair. Objective: Vital signs in last 24 hours: Temp:  [98 F (36.7 C)-99 F (37.2 C)] 98 F (36.7 C) (09/23 0602) Pulse Rate:  [49-66] 49 (09/23 0602) Resp:  [14-21] 17 (09/23 0602) BP: (100-133)/(54-75) 110/54 (09/23 0602) SpO2:  [89 %-96 %] 96 % (09/23 0602) Last BM Date: 05/12/20 2556 IV 975 urine Tm 99.3; VSS Prealbumin 12.4 Urinalysis shows 11-20 RBC; > 50 WBC/HPF  CT scan 9/21: Large amount of stool throughout the colon with an abrupt transition point at the sigmoid colon.  There is circumferential wall thickening with adjacent inflammatory changes.  Consistent with colitis/diverticulitis.  There are findings suspicious for contained perforation abutting the urinary bladder.  Underlying mass is not excluded.  Findings highly suspicious for colovesicular fistula with a moderate to large amount of air in the urinary bladder.  T9 compression fracture Intake/Output from previous day: 09/22 0701 - 09/23 0700 In: 2556.9 [I.V.:2506.8; IV Piggyback:50] Out: 975 [Urine:975] Intake/Output this shift: No intake/output data recorded.  General appearance: alert, cooperative and no distress Resp: clear to auscultation bilaterally; on CPAP x 16 years GI: soft, non-tender; bowel sounds normal; no masses,  no organomegaly  Lab Results:  Recent Labs    05/14/20 1514 05/15/20 0210  WBC 9.9 9.1  HGB 13.0 10.8*  HCT 39.4 32.4*  PLT 261 181    BMET Recent Labs    05/14/20 1514 05/15/20 0210  NA 136 135  K 4.1 3.7  CL 95* 102  CO2 30 27  GLUCOSE 106* 87  BUN 16 15  CREATININE 1.86* 1.67*  CALCIUM 12.9* 11.2*   PT/INR No results for input(s):  LABPROT, INR in the last 72 hours.  Recent Labs  Lab 05/14/20 1514  AST 22  ALT 14  ALKPHOS 72  BILITOT 0.5  PROT 6.4*  ALBUMIN 3.7     Lipase     Component Value Date/Time   LIPASE 26 05/14/2020 1514     Medications: . Chlorhexidine Gluconate Cloth  6 each Topical Daily  . docusate sodium  100-200 mg Oral QHS  . hydroxychloroquine  200 mg Oral Daily  . insulin aspart  0-9 Units Subcutaneous TID PC & HS  . lamoTRIgine  150 mg Oral QHS  . lamoTRIgine  75 mg Oral q morning - 10a  . nadolol  10 mg Oral Daily  . pantoprazole  40 mg Oral Daily  . predniSONE  10 mg Oral Daily  . traZODone  200 mg Oral QHS   . dextrose 5 % and 0.9% NaCl 100 mL/hr at 05/16/20 0552  . piperacillin-tazobactam (ZOSYN)  IV 3.375 g (05/16/20 0228)    Assessment/Plan Psoriatic arthritis, Immunosuppressed on prednisone, methotrexate, plaquenil, and Enbrel - last had enbrel and MTX 1 week ago  Dr. 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 Rodman Key Gay(Significant amount of debris in the bladder limiting the study. No large bladder mass identified. Plans to repeat study in the future before surgery)   - urine culture pending  - constipation after taking pain medicine for fall injury H/O MI/CAD - March 2021  H/O  CVA    - on plavix.  Would hold for now pending further possible interventions CKD stage III   - creatinine 1.86>>1.67 OSA Type II DM with peripheral neuropathy Multiple falls; severe deconditioning   - Using cane/walker/wheelchair since November 2020 Moderate malnutrition - prealbumin 12.4(9/23)  Sigmoid colitis with possible microperforation, stricture             The patient has had pneumaturia for the last 3 months and a CT with extensive air in her bladder c/w colovesical fistula.  Her UA also supports this idea as well.  Urology consult is recommended for evaluation.               The patient is significantly  immunosuppressed on multiple agents and has a low chance of healing any of these issues on her own without intervention.  We will discuss with our colorectal specialist, but suspicion she will need some type of diverting colostomy this admission +/- resection of diseased area as well vs at a later date. Will aim for maximum medical management at this time, and work toward resection once her immunosuppression/steroids are minimized.             For now, continue bowel rest, IV abx therapy, and will follow closely for more definitive plans.  FEN - NPO/IVFs VTE - hold plavix, SCDs ordered; may have chemical DVT prophylaxis from our standpoint ID - zosyn 9/21 >> day 3  Plan: We will review with Dr. Lucia Gaskins.  Continue current antibiotics.  Appreciate Dr. Virgina Norfolk assistance..      LOS: 2 days   JENNINGS,WILLARD 05/16/2020 Please see Amion  Agree with above. Daughter, Gwinda Maine, in room with patient. To give clear liquids since there is no immediate plans for surgery. Will get PT to see patient for ambulation. Ideally would be to have cystoscopy and colonoscopy during this hospitalization to rule out malignant process. If all indications are that this is benign (diverticulitis), then consider steroid holiday and attempted one stage operation by one our colo-rectal surgeons in 6 to 10 weeks.  Her PCP is Dr. Otelia Santee [phone])  Alphonsa Overall, MD, [facility] Surgery Office phone:  [phone]

## 2020-05-16 NOTE — Progress Notes (Signed)
Initial Nutrition Assessment  DOCUMENTATION CODES:   Obesity unspecified  INTERVENTION:   -Boost Breeze po TID, each supplement provides 250 kcal and 9 grams of protein  NUTRITION DIAGNOSIS:   Inadequate oral intake related to nausea, vomiting, constipation as evidenced by per patient/family report.  GOAL:   Patient will meet greater than or equal to 90% of their needs  MONITOR:   PO intake, Supplement acceptance, Labs, Weight trends, I & O's  REASON FOR ASSESSMENT:   Malnutrition Screening Tool    ASSESSMENT:   59 y.o. Caucasian female with a known history of type 2 diabetes mellitus, peripheral neuropathy, stage III chronic kidney disease, obstructive sleep apnea, coronary artery disease and psoriatic arthritis, presented to the emergency room with acute onset of abdominal pain with associated constipation for about a week for which she took prune juice, Dulcolax and Colace with only small bowel movements and later had nausea and vomiting  Patient in room with daughter at bedside.  Pt reports poor PO for 5-6 days PTA. Pt was having N/V and cannot recall the last food she was able to tolerate PTA. Pt also with constipation. No BM yet. Pt ambulates with wheelchair/walker/cane.  Pt just now had her diet advanced to clears. Will order supplements once tolerating.    Per weight records, pt has lost 29 lbs since 4/12 (12% wt loss x 5.5 months, significant for time frame).   Medications: Colace, D5 infusion  Labs reviewed:  CBGs: 88-92  NUTRITION - FOCUSED PHYSICAL EXAM:  No depletions noted.  Diet Order:   Diet Order            Diet clear liquid Room service appropriate? Yes; Fluid consistency: Thin  Diet effective now                 EDUCATION NEEDS:   No education needs have been identified at this time  Skin:  Skin Assessment: Reviewed RN Assessment  Last BM:  9/19  Height:   Ht Readings from Last 1 Encounters:  05/14/20 5\' 2"  (1.575 m)    Weight:    Wt Readings from Last 1 Encounters:  05/14/20 90.7 kg    BMI:  Body mass index is 36.58 kg/m.  Estimated Nutritional Needs:   Kcal:  1700-1900  Protein:  60-75g  Fluid:  1.8L/day  Clayton Bibles, MS, RD, LDN Inpatient Clinical Dietitian Contact information available via Amion

## 2020-05-16 NOTE — Progress Notes (Signed)
Subjective: Denies abdominal pain, no nausea or emesis. No recent BM. Passing flatus. Tolerating foley.  Objective: Vital signs in last 24 hours: Temp:  [98 F (36.7 C)-99 F (37.2 C)] 99 F (37.2 C) (09/22 2057) Pulse Rate:  [54-66] 59 (09/22 2057) Resp:  [14-30] 17 (09/22 2057) BP: (87-133)/(44-83) 100/60 (09/22 2057) SpO2:  [87 %-99 %] 89 % (09/22 2057)  Intake/Output from previous day: 09/22 0701 - 09/23 0700 In: 1559.8 [I.V.:1509.8; IV Piggyback:50] Out: -  Intake/Output this shift: No intake/output data recorded.   UOP: approx 640ml yellow urine with sediment in urometer  Physical Exam:  General: Alert and oriented CV: RRR Lungs: Clear Abdomen: Soft, ND, NT GU: foley draining yellow urine with sediment Ext: NT, No erythema  Lab Results: Recent Labs    05/14/20 1514 05/15/20 0210  HGB 13.0 10.8*  HCT 39.4 32.4*   BMET Recent Labs    05/14/20 1514 05/15/20 0210  NA 136 135  K 4.1 3.7  CL 95* 102  CO2 30 27  GLUCOSE 106* 87  BUN 16 15  CREATININE 1.86* 1.67*  CALCIUM 12.9* 11.2*     Studies/Results: CT ABDOMEN PELVIS WO CONTRAST  Result Date: 05/14/2020 CLINICAL DATA:  Epigastric pain.  Pain status post fall EXAM: CT ABDOMEN AND PELVIS WITHOUT CONTRAST TECHNIQUE: Multidetector CT imaging of the abdomen and pelvis was performed following the standard protocol without IV contrast. COMPARISON:  None. FINDINGS: Lower chest: The lung bases are clear.  The heart size is normal. Hepatobiliary: The liver is normal. Status post cholecystectomy.There is no biliary ductal dilation. Pancreas: Normal contours without ductal dilatation. No peripancreatic fluid collection. Spleen: Unremarkable. Adrenals/Urinary Tract: --Adrenal glands: Unremarkable. --Right kidney/ureter: No hydronephrosis or radiopaque kidney stones. --Left kidney/ureter: No hydronephrosis or radiopaque kidney stones. --Urinary bladder: There is asymmetric urinary bladder wall thickening. There is a  moderate to large volume of air within the urinary bladder. Stomach/Bowel: --Stomach/Duodenum: No hiatal hernia or other gastric abnormality. Normal duodenal course and caliber. --Small bowel: Unremarkable. --Colon: There is a large amount of stool throughout colon. There is a relatively abrupt transition point the level of the sigmoid colon where there are adjacent inflammatory changes circumferential a wall thickening. There is a questionable contained perforation of the sigmoid colon abutting the urinary bladder, measuring approximately 2.1 cm on the coronal series. This is not amenable to percutaneous drainage given its small size. --Appendix: Not visualized. No right lower quadrant inflammation or free fluid. Vascular/Lymphatic: Normal course and caliber of the major abdominal vessels. --No retroperitoneal lymphadenopathy. --No mesenteric lymphadenopathy. --No pelvic or inguinal lymphadenopathy. Reproductive: Status post hysterectomy. No adnexal mass. Other: There is a small volume of free fluid in the patient's pelvis. The abdominal wall is normal. Musculoskeletal. There is an age-indeterminate compression fracture of the T9 vertebral body with approximately 20% height loss anteriorly. The patient is status post bilateral total hip arthroplasty. There is extensive streak artifact through the patient's pelvis that limits evaluation of this region. IMPRESSION: 1. Large amount of stool throughout the colon with an abrupt transition point at the sigmoid colon. At this location, there is circumferential wall thickening with adjacent inflammatory changes. Findings may be secondary to colitis/diverticulitis. There are findings suspicious for contained perforation abutting the urinary bladder. However, an underlying mass is not excluded and should be further evaluated with colonoscopy. 2. Findings highly suspicious for a colovesicular fistula given the presence of a moderate to large amount of air within the urinary  bladder. 3. Age-indeterminate compression fracture of the  T9 vertebral body. Aortic Atherosclerosis (ICD10-I70.0). Electronically Signed   By: Constance Holster M.D.   On: 05/14/2020 18:29   DG Chest 2 View  Result Date: 05/14/2020 CLINICAL DATA:  Cough. EXAM: CHEST - 2 VIEW COMPARISON:  November 13, 2019. FINDINGS: The heart size and mediastinal contours are within normal limits. Both lungs are clear. The visualized skeletal structures are unremarkable. IMPRESSION: No active cardiopulmonary disease. Electronically Signed   By: Marijo Conception M.D.   On: 05/14/2020 18:22    Assessment/Plan: 1. Colovesicular fistula: CT abdomen pelvis on 05/14/2020 with apparent colovesicular fistula with large amount of air in the bladder and adjacent diverticulitis with a contained perforation.  Complaints of pneumaturia and sediment in her urine for 3 months. a. F/u urine cx b. Cystoscopy yesterday limited by debris. Will plan to repeat prior to intervention. c. Recommend colonoscopy to eval for colonic mass d. Will defer to general surgery regarding timing of OR. Likely will require bowel resection with anastomosis. Will be available for bladder repair and omental interposition. e. Leave foley catheter to gravity f. Zosyn 2. Microscopic hematuria: UA 9/21 with 11-20 RBC a. Cystoscopy 9/22 limited by debris in bladder. Will plan to repeat after foley drainage. Can be done outpatient or just prior to OR intervention. b. Given significant smoking history (40 pack years) will need to evaluate upper tracts. GFR only 33. Hold off on CT Urogram for now. Could evaluate with cystoscopy and bilateral retrograde pyelograms later vs MRI.   LOS: 2 days   Valerie [name] 05/16/2020, 4:57 AM [facility]  Pager: [phone]

## 2020-05-17 ENCOUNTER — Encounter (HOSPITAL_COMMUNITY): Payer: Self-pay | Admitting: Family Medicine

## 2020-05-17 ENCOUNTER — Inpatient Hospital Stay (HOSPITAL_COMMUNITY): Payer: Medicare Other | Admitting: Anesthesiology

## 2020-05-17 ENCOUNTER — Encounter (HOSPITAL_COMMUNITY)
Admission: EM | Disposition: A | Discharge status: Home Health | Payer: Self-pay | Source: Home / Self Care | Attending: Internal Medicine

## 2020-05-17 HISTORY — PX: FLEXIBLE SIGMOIDOSCOPY: SHX5431

## 2020-05-17 LAB — GLUCOSE, CAPILLARY
Glucose-Capillary: 90 mg/dL (ref 70–99)
Glucose-Capillary: 126 mg/dL — ABNORMAL HIGH (ref 70–99)
Glucose-Capillary: 111 mg/dL — ABNORMAL HIGH (ref 70–99)
Glucose-Capillary: 119 mg/dL — ABNORMAL HIGH (ref 70–99)

## 2020-05-17 SURGERY — SIGMOIDOSCOPY, FLEXIBLE
Anesthesia: Monitor Anesthesia Care

## 2020-05-17 MED ORDER — PROPOFOL 500 MG/50ML IV EMUL
INTRAVENOUS | Status: DC | PRN
  Administered 2020-05-17: 20 mg via INTRAVENOUS

## 2020-05-17 MED ORDER — PROPOFOL 500 MG/50ML IV EMUL
INTRAVENOUS | Status: DC | PRN
  Administered 2020-05-17: 125 ug/kg/min via INTRAVENOUS

## 2020-05-17 MED ORDER — LACTATED RINGERS IV SOLN: INTRAVENOUS | Status: DC | PRN

## 2020-05-17 MED ORDER — GLYCOPYRROLATE 0.2 MG/ML IJ SOLN
INTRAMUSCULAR | Status: DC | PRN
  Administered 2020-05-17: .2 mg via INTRAVENOUS

## 2020-05-17 MED ORDER — ONDANSETRON HCL 4 MG/2ML IJ SOLN
INTRAMUSCULAR | Status: DC | PRN
  Administered 2020-05-17: 4 mg via INTRAVENOUS

## 2020-05-17 MED ORDER — LIDOCAINE HCL (CARDIAC) PF 100 MG/5ML IV SOSY
PREFILLED_SYRINGE | INTRAVENOUS | Status: DC | PRN
  Administered 2020-05-17: 80 mg via INTRAVENOUS

## 2020-05-17 MED ORDER — SODIUM CHLORIDE 0.9 % IV SOLN: INTRAVENOUS | Status: DC

## 2020-05-17 MED ORDER — FLEET ENEMA 7-19 GM/118ML RE ENEM
1.0000 | ENEMA | Freq: Once | RECTAL | Status: AC
  Administered 2020-05-17: 1 via RECTAL
  Filled 2020-05-17: qty 1

## 2020-05-17 NOTE — Progress Notes (Signed)
OM:VEHMCNOBS pain after fall, nausea and vomiting x 3 days  Subjective:  She is feeling much better since she has been in the hospital.  Her only complaint is bladder spasms.  Patient reports her mobility is limited due to her peripheral neuropathy.  She has had multiple falls.  Since November 2020 she has been limited to a cane, walker, and wheelchair.  Objective: Vital signs in last 24 hours: Temp:  [97.9 F (36.6 C)-98.2 F (36.8 C)] 97.9 F (36.6 C) (09/24 0500) Pulse Rate:  [50-55] 51 (09/24 0500) Resp:  [15-17] 17 (09/24 0500) BP: (92-132)/(51-65) 92/51 (09/24 0500) SpO2:  [88 %-98 %] 98 % (09/24 0500) Last BM Date: 05/12/20 2556 IV 975 urine Tm 99.3; VSS Prealbumin 12.4 Urinalysis shows 11-20 RBC; > 50 WBC/HPF  CT scan 9/21: Large amount of stool throughout the colon with an abrupt transition point at the sigmoid colon.  There is circumferential wall thickening with adjacent inflammatory changes.  Consistent with colitis/diverticulitis.  There are findings suspicious for contained perforation abutting the urinary bladder.  Underlying mass is not excluded.  Findings highly suspicious for colovesicular fistula with a moderate to large amount of air in the urinary bladder.  T9 compression fracture Intake/Output from previous day: 09/23 0701 - 09/24 0700 In: 240 [P.O.:240] Out: 1025 [Urine:1025] Intake/Output this shift: No intake/output data recorded.  General appearance: alert, cooperative and no distress Resp: clear to auscultation bilaterally; on CPAP x 16 years GI: obese, soft, non-tender; bowel sounds normal; no masses,  no organomegaly  Lab Results:  Recent Labs    05/14/20 1514 05/15/20 0210  WBC 9.9 9.1  HGB 13.0 10.8*  HCT 39.4 32.4*  PLT 261 181    BMET Recent Labs    05/14/20 1514 05/15/20 0210  NA 136 135  K 4.1 3.7  CL 95* 102  CO2 30 27  GLUCOSE 106* 87  BUN 16 15  CREATININE 1.86* 1.67*  CALCIUM 12.9* 11.2*   PT/INR No results for  input(s): LABPROT, INR in the last 72 hours.  Recent Labs  Lab 05/14/20 1514  AST 22  ALT 14  ALKPHOS 72  BILITOT 0.5  PROT 6.4*  ALBUMIN 3.7     Lipase     Component Value Date/Time   LIPASE 26 05/14/2020 1514     Medications:  Chlorhexidine Gluconate Cloth  6 each Topical Daily   docusate sodium  100-200 mg Oral QHS   hydroxychloroquine  200 mg Oral Daily   insulin aspart  0-9 Units Subcutaneous TID PC & HS   lamoTRIgine  150 mg Oral QHS   lamoTRIgine  75 mg Oral q morning - 10a   nadolol  10 mg Oral Daily   pantoprazole  40 mg Oral Daily   predniSONE  10 mg Oral Daily   sodium phosphate  1 enema Rectal Once   traZODone  200 mg Oral QHS    dextrose 5 % and 0.9% NaCl 100 mL/hr at 05/16/20 2119   piperacillin-tazobactam (ZOSYN)  IV 3.375 g (05/17/20 0333)    Assessment/Plan Psoriatic arthritis, Immunosuppressed on prednisone, methotrexate, plaquenil, and Enbrel - last had enbrel and MTX 1 week ago  Dr. Otelia Santee [facility] [phone]), her PCP, manages her psoriatic arthritis Colovesical fistula    - cystoscopy 9/22 Dr. Rodman Key Gay(Significant amount of debris in the bladder limiting the study. No large bladder mass identified. Plans to repeat study in the future before surgery)   - urine culture pending H/O MI/CAD -  March 2021  H/O CVA    - on plavix.    This can be restarted since there is no immediate plan for surgery CKD stage III   - creatinine 1.86>>1.67 OSA Type II DM with peripheral neuropathy Multiple falls; severe deconditioning   - Using cane/walker/wheelchair since November 2020 Moderate malnutrition - prealbumin 12.4 -05/16/2020  Sigmoid colitis with possible microperforation, stricture - colovesical fistula             The patient has had pneumaturia for the last 3 months and a CT with extensive air in her bladder c/w colovesical fistula.  Her UA also supports this idea as well.  Urology consult is recommended for evaluation.                The patient is significantly immunosuppressed on multiple agents and has a low chance of healing any of these issues on her own without intervention.    FEN - NPO/IVFs VTE - hold plavix, SCDs ordered; may have chemical DVT prophylaxis from our standpoint ID - zosyn 9/21 >> day 3  Plan: Spoke to Dr. Benson Norway who will try sigmoidoscopy today to rule out potential GI malignancy    Spoke to Dr. Abner Greenspan who will arrange out patient/in office cystoscopy  Spoke to Dr. Rodena Piety who will speak to Dr. Dimas Millin (Her PCP is Dr. Otelia Santee [phone]) about her diagnosis and outpatient care.  Will try to give her get her off her immunosuppressants for 6 to 10 weeks with an planned sigmoid colon resection on an elective basis.  This would give her the best chance of a one stage procedure.  If she has a malignancy we will have to move with earlier surgery.   We will arrange follow up in our office in 2 to 4 weeks with one of our colo-rectal surgeons.    LOS: 3 days   Alphonsa Overall, MD, [facility] Office phone:  [phone]

## 2020-05-17 NOTE — Consult Note (Signed)
Reason for Consult: Abnormal CT scan. Referring Physician: [facility]  [name] HPI: This is a 59 year old female admitted with complaints of abdominal pain, nausea, and vomiting.  She has a PMH significant for psoriatic arthritis and she takes prednisone, MTX, Enbrel, and plaquenil.  Over the past several weeks she reports having problems with constipatino and she required the use of laxatives, which helped.  She also reports having foul smelling urine and air in her urinary stream.  A CT scan of the abdomen shows a possible contained perforation, but speaking with Dr. Lucia Gaskins, the thought is that she has a fistula to her urinary bladder.  GI is consulted to further evaluate her sigmoid colon.  Over ten years ago, per her report, some polyps were identified, but her Cologuard 4 years ago was normal.  Past Medical History:  Diagnosis Date  . Anxiety   . Arthritis    Psoriatic  . Bipolar disorder ([place])   . CKD (chronic kidney disease), stage III   . Depression   . DM type 2 (diabetes mellitus, type 2) ([place]) 11/14/2019  . Neuropathy   . NSTEMI (non-ST elevated myocardial infarction) ([place]) 11/14/2019  . OSA (obstructive sleep apnea)   . Psoriatic arthritis ([place])   . Sleep apnea     Past Surgical History:  Procedure Laterality Date  . LEFT HEART CATH AND CORONARY ANGIOGRAPHY N/A 11/14/2019   Procedure: LEFT HEART CATH AND CORONARY ANGIOGRAPHY;  Surgeon: Martinique, Peter M, MD;  Location: [facility];  Service: Cardiovascular;  Laterality: N/A;    Family History  Problem Relation Age of Onset  . Breast cancer Neg Hx     Social History:  reports that she has been smoking. She has never used smokeless tobacco. No history on file for alcohol use and drug use.  Allergies:  Allergies  Allergen Reactions  . Carbamazepine Other (See Comments)    Parkinsons like symptoms tremors  . Sertraline Hcl Other (See Comments)    Unknown reaction    Medications:  Scheduled: .  [MAR Hold] Chlorhexidine Gluconate Cloth  6 each Topical Daily  . [MAR Hold] docusate sodium  100-200 mg Oral QHS  . [MAR Hold] hydroxychloroquine  200 mg Oral Daily  . [MAR Hold] insulin aspart  0-9 Units Subcutaneous TID PC & HS  . [MAR Hold] lamoTRIgine  150 mg Oral QHS  . [MAR Hold] lamoTRIgine  75 mg Oral q morning - 10a  . [MAR Hold] nadolol  10 mg Oral Daily  . [MAR Hold] pantoprazole  40 mg Oral Daily  . [MAR Hold] predniSONE  10 mg Oral Daily  . [MAR Hold] traZODone  200 mg Oral QHS   Continuous: . sodium chloride    . dextrose 5 % and 0.9% NaCl 100 mL/hr at 05/17/20 0952  . [MAR Hold] piperacillin-tazobactam (ZOSYN)  IV 3.375 g (05/17/20 0956)    Results for orders placed or performed during the hospital encounter of 05/14/20 (from the past 24 hour(s))  Glucose, capillary     Status: Abnormal   Collection Time: 05/16/20  4:25 PM  Result Value Ref Range   Glucose-Capillary 114 (H) 70 - 99 mg/dL  Glucose, capillary     Status: Abnormal   Collection Time: 05/16/20 10:02 PM  Result Value Ref Range   Glucose-Capillary 115 (H) 70 - 99 mg/dL   Comment 1 Notify RN    Comment 2 Document in Chart   Glucose, capillary     Status: None  Collection Time: 05/17/20  7:57 AM  Result Value Ref Range   Glucose-Capillary 90 70 - 99 mg/dL  Glucose, capillary     Status: Abnormal   Collection Time: 05/17/20 11:44 AM  Result Value Ref Range   Glucose-Capillary 126 (H) 70 - 99 mg/dL     No results found.  ROS:  As stated above in the HPI otherwise negative.  Blood pressure 133/72, pulse (!) 50, temperature 97.7 F (36.5 C), temperature source Oral, resp. rate 15, height 5\' 2"  (1.575 m), weight 90.7 kg, SpO2 92 %.    PE: Gen: NAD, Alert and Oriented HEENT:  [place]/AT, EOMI Neck: Supple, no LAD Lungs: CTA Bilaterally CV: RRR without M/G/R ABD: Soft, NTND, +BS Ext: No C/C/E  Assessment/Plan: 1) Sigmoid colon fistula. 2) ? Sigmoid colon mass. 3) History of immunosuppression for  her psoriatic arthritis.   Speaking with Dr. Lucia Gaskins, the plan is to perform a FFS for further evaluation.  There does not appear to be a perforation, rather it is a fistula.  Plan: 1) FFS today and further recommendations pending the findings.  Hadlie Gipson D 05/17/2020, 3:09 PM

## 2020-05-17 NOTE — Op Note (Signed)
[facility] Patient Name: Valerie [name] Procedure Date: 05/17/2020 MRN: [id number] Attending MD: Carol Ada , MD Date of Birth: Jul 21, 1961 CSN: [id number] Age: 59 Admit Type: Inpatient Procedure:                Flexible Sigmoidoscopy Indications:              Abnormal CT of the GI tract Providers:                Carol Ada, MD, Glori Bickers, RN, Laverda Sorenson,                            Technician, Arnoldo Hooker, CRNA Referring MD:              Medicines:                Propofol per Anesthesia Complications:            No immediate complications. Estimated Blood Loss:     Estimated blood loss: none. Procedure:                Pre-Anesthesia Assessment:                           - Prior to the procedure, a History and Physical                            was performed, and patient medications and                            allergies were reviewed. The patient's tolerance of                            previous anesthesia was also reviewed. The risks                            and benefits of the procedure and the sedation                            options and risks were discussed with the patient.                            All questions were answered, and informed consent                            was obtained. Prior Anticoagulants: The patient has                            taken Plavix (clopidogrel), last dose was 5 days                            prior to procedure. ASA Grade Assessment: III - A                            patient with severe systemic disease. After  reviewing the risks and benefits, the patient was                            deemed in satisfactory condition to undergo the                            procedure.                           - Sedation was administered by an anesthesia                            professional. Deep sedation was attained.                           After obtaining informed consent, the scope was                             passed under direct vision. The [id number]-[id number] ([id number])                            was introduced through the anus and advanced to the                            the descending colon. The flexible sigmoidoscopy                            was technically difficult and complex due to a                            tortuous colon. Successful completion of the                            procedure was aided by straightening and shortening                            the scope to obtain bowel loop reduction. The                            quality of the bowel preparation was adequate. Scope In: Scope Out: 4:22:34 PM Findings:      A benign-appearing, intrinsic moderate stenosis measuring of unknown       length was found in the sigmoid colon and was traversed.      Scattered small and large-mouthed diverticula were found in the sigmoid       colon.      The procedure was extremely difficult to perform, but with gentle       forward pressure and using a column of water, the sigmoid colon was       traversed. There was NO evidence of any malignancy. No fistulas were       endoscopically visualized. It is not clear if the degree of difficulty       with traversing this area was as a result of an acute angulation, around       30 cm, or stenosis. It is most  likely a combination of the two. Impression:               - Stricture in the sigmoid colon.                           - Diverticulosis in the sigmoid colon.                           - No specimens collected. Moderate Sedation:      Not Applicable - Patient had care per Anesthesia. Recommendation:           - Return patient to hospital ward for ongoing care.                           - Clear liquid diet.                           - Miralax BID. Procedure Code(s):        --- Professional ---                           [phone], Sigmoidoscopy, flexible; diagnostic,                            including collection of specimen(s) by  brushing or                            washing, when performed (separate procedure) Diagnosis Code(s):        --- Professional ---                           O70.962, Other intestinal obstruction unspecified                            as to partial versus complete obstruction                           K57.30, Diverticulosis of large intestine without                            perforation or abscess without bleeding                           R93.3, Abnormal findings on diagnostic imaging of                            other parts of digestive tract CPT copyright 2019 [employer]. All rights reserved. The codes documented in this report are preliminary and upon coder review may  be revised to meet current compliance requirements. Carol Ada, MD Carol Ada, MD 05/17/2020 4:39:40 PM This report has been signed electronically. Number of Addenda: 0

## 2020-05-17 NOTE — Progress Notes (Signed)
PROGRESS NOTE    Valerie [name]  ZOX:[id number] DOB: [birth year] DOA: 05/14/2020 PCP: Ophelia Shoulder, MD  Brief Narrative:Valerie [name]  is a 59 y.o. Caucasian female with a known history of type 2 diabetes mellitus, peripheral neuropathy, stage III chronic kidney disease, obstructive sleep apnea, coronary artery disease and psoriatic arthritis, presented to the emergency room with acute onset of abdominal pain with associated constipation for about a week for which she took prune juice, Dulcolax and Colace with only small bowel movements and later had nausea and vomiting.  Prior to that the patient had a fall about 3 weeks ago and was given pain medications including oxycodone for left sided rib pain and upper abdominal pain and dose were thought to be the culprit for her constipation.  No dyspnea or cough or wheezing.  No dysuria, oliguria or hematuria or flank pain.  No chest pain or palpitations. She has not been vaccinated for COVID-19 however she is enrolled in a trial with [employer] since January that apparently involves intrusion intake. No recent exposure to COVID-19. She has upper and lower extremity bruising that she relates to her neuropathy and partly due to her fall.  Upon presentation to the emergency room, blood pressure was 97/72 with otherwise normal vital signs.  Labs revealed a creatinine of 1.86 close to baseline with a BUN of 16, calcium was 12.9 with albumin of 3.7 with otherwise unremarkable CMP.  CBC showed relative macrocytosis.  Abdominal pelvic CT scan showed the following:  1. Large amount of stool throughout the colon with an abrupt transition point at the sigmoid colon. At this location, there is circumferential wall thickening with adjacent inflammatory changes. Findings may be secondary to colitis/diverticulitis. There are findings suspicious for contained perforation abutting the urinary bladder. However, an underlying mass is not excluded and should be further  evaluated with colonoscopy. 2. 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 3. 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  Plan to redo cystoscopy as an outpatient. Patient to have flex sig today.  Urine culture with mixed growth.  Discussed with Dr. Johnnye Sima will plan to send her home on Augmentin.  I have left a message with patient's primary care physician Dr. Dimas Millin to call me back.  CT abdomen pelvis-Large amount of stool throughout the colon with an abrupt transition point at the sigmoid colon. At this location, there is circumferential wall thickening with adjacent inflammatory changes. Findings may be secondary to colitis/diverticulitis. There are findings suspicious for contained perforation abutting the urinary bladder. However, an underlying mass is not excluded and should be further evaluated with colonoscopy. 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 Age-indeterminate compression fracture of the T9 vertebral body Surgery following Urology consulted GI consulted Continue IV Zosyn n.p.o. and IV fluids  #2 type 2 diabetes-patient is n.p.o. continue SSI on D5 normal saline since she became hypoglycemic. CBG (last 3)  Recent Labs    05/16/20 2202 05/17/20 0757 05/17/20 1144  GLUCAP 115* 90 126*     #3 history of stroke on Plavix  #4 history of psoriatic arthritis on multiple meds including methotrexate Plaquenil  and Enbrel and prednisone   Estimated body mass index is 36.58 kg/m as calculated from the following:   Height as of  this encounter: 5\' 2"  (1.575 m).   Weight as of this encounter: 90.7 kg.  DVT prophylaxis: scd Code Status: full Family Communication:dw daughter Disposition Plan:  Status is: Inpatient  Dispo: The patient is from: Home              Anticipated d/c is to: Home              Anticipated d/c date is: > 3 days              Patient currently is not medically stable to d/c. Consultants: GI, general surgery, urology   Procedures: None Antimicrobials: Zosyn  Subjective: Patient complains of bladder spasm Other than that she has no complaints urine continues to be cloudy and with sediments Objective: Vitals:   05/16/20 1415 05/16/20 2159 05/17/20 0500 05/17/20 0940  BP: 132/65 (!) 93/59 (!) 92/51 125/70  Pulse: (!) 50 (!) 54 (!) 51 (!) 50  Resp: 15 17 17    Temp: 98.2 F (36.8 C) 98.1 F (36.7 C) 97.9 F (36.6 C)   TempSrc: Oral Oral Oral   SpO2: (!) 88% 92% 98%   Weight:      Height:        Intake/Output Summary (Last 24 hours) at 05/17/2020 1200 Last data filed at 05/17/2020 0600 Gross per 24 hour  Intake 240 ml  Output 1025 ml  Net -785 ml   Filed Weights   05/14/20 1434  Weight: 90.7 kg    Examination: Foley draining cloudy urine  General exam: Appears calm and comfortable  Respiratory system: Clear to auscultation. Respiratory effort normal. Cardiovascular system: S1 & S2 heard, RRR. No JVD, murmurs, rubs, gallops or clicks. No pedal edema. Gastrointestinal system: Abdomen is distended, soft and nontender. No organomegaly or masses felt. Normal bowel sounds heard. Central nervous system: Alert and oriented. No focal neurological deficits. Extremities: Symmetric 5 x 5 power. Skin: No rashes, lesions or ulcers Psychiatry: Judgement and insight appear normal. Mood & affect appropriate.     Data Reviewed: I have personally reviewed following labs and imaging studies  CBC: Recent Labs  Lab 05/14/20 1514 05/15/20 0210  WBC 9.9 9.1  HGB 13.0 10.8*  HCT 39.4  32.4*  MCV 106.8* 106.2*  PLT 261 952   Basic Metabolic Panel: Recent Labs  Lab 05/14/20 1514 05/15/20 0210  NA 136 135  K 4.1 3.7  CL 95* 102  CO2 30 27  GLUCOSE 106* 87  BUN 16 15  CREATININE 1.86* 1.67*  CALCIUM 12.9* 11.2*   GFR: Estimated Creatinine Clearance: 37.5 mL/min (A) (by C-G formula based on SCr of 1.67 mg/dL (H)). Liver Function Tests: Recent Labs  Lab 05/14/20 1514  AST 22  ALT 14  ALKPHOS 72  BILITOT 0.5  PROT 6.4*  ALBUMIN 3.7   Recent Labs  Lab 05/14/20 1514  LIPASE 26   No results for input(s): AMMONIA in the last 168 hours. Coagulation Profile: No results for input(s): INR, PROTIME in the last 168 hours. Cardiac Enzymes: No results for input(s): CKTOTAL, CKMB, CKMBINDEX, TROPONINI in the last 168 hours. BNP (last 3 results) No results for input(s): PROBNP in the last 8760 hours. HbA1C: Recent Labs    05/14/20 1514  HGBA1C 5.4   CBG: Recent Labs  Lab 05/16/20 1214 05/16/20 1625 05/16/20 2202 05/17/20 0757 05/17/20 1144  GLUCAP 88 114* 115*  90 126*   Lipid Profile: No results for input(s): CHOL, HDL, LDLCALC, TRIG, CHOLHDL, LDLDIRECT in the last 72 hours. Thyroid Function Tests: No results for input(s): TSH, T4TOTAL, FREET4, T3FREE, THYROIDAB in the last 72 hours. Anemia Panel: No results for input(s): VITAMINB12, FOLATE, FERRITIN, TIBC, IRON, RETICCTPCT in the last 72 hours. Sepsis Labs: No results for input(s): PROCALCITON, LATICACIDVEN in the last 168 hours.  Recent Results (from the past 240 hour(s))  SARS Coronavirus 2 by RT PCR (hospital order, performed in [facility] hospital lab) Nasopharyngeal Nasopharyngeal Swab     Status: None   Collection Time: 05/14/20  7:18 PM   Specimen: Nasopharyngeal Swab  Result Value Ref Range Status   SARS Coronavirus 2 NEGATIVE NEGATIVE Final    Comment: (NOTE) SARS-CoV-2 target nucleic acids are NOT DETECTED.  The SARS-CoV-2 RNA is generally detectable in upper and  lower respiratory specimens during the acute phase of infection. The lowest concentration of SARS-CoV-2 viral copies this assay can detect is 250 copies / mL. A negative result does not preclude SARS-CoV-2 infection and should not be used as the sole basis for treatment or other patient management decisions.  A negative result may occur with improper specimen collection / handling, submission of specimen other than nasopharyngeal swab, presence of viral mutation(s) within the areas targeted by this assay, and inadequate number of viral copies (<250 copies / mL). A negative result must be combined with clinical observations, patient history, and epidemiological information.  Fact Sheet for Patients:   StrictlyIdeas.no  Fact Sheet for Healthcare Providers: BankingDealers.co.za  This test is not yet approved or  cleared by the [place] FDA and has been authorized for detection and/or diagnosis of SARS-CoV-2 by FDA under an Emergency Use Authorization (EUA).  This EUA will remain in effect (meaning this test can be used) for the duration of the COVID-19 declaration under Section 564(b)(1) of the Act, 21 U.S.C. section 360bbb-3(b)(1), unless the authorization is terminated or revoked sooner.  Performed at [facility], [place] [address] [place], [place] [address]   Culture, Urine     Status: Abnormal   Collection Time: 05/15/20  3:13 PM   Specimen: Urine, Random  Result Value Ref Range Status   Specimen Description   Final    URINE, RANDOM Performed at [facility] [address], [place], [place] [address]    Special Requests   Final    NONE Performed at [facility], [place] [address] [place], [place] [address]    Culture MULTIPLE SPECIES PRESENT, SUGGEST RECOLLECTION (A)  Final   Report Status 05/16/2020 FINAL  Final         Radiology Studies: No results  found.      Scheduled Meds: . Chlorhexidine Gluconate Cloth  6 each Topical Daily  . docusate sodium  100-200 mg Oral QHS  . hydroxychloroquine  200 mg Oral Daily  . insulin aspart  0-9 Units Subcutaneous TID PC & HS  . lamoTRIgine  150 mg Oral QHS  . lamoTRIgine  75 mg Oral q morning - 10a  . nadolol  10 mg Oral Daily  . pantoprazole  40 mg Oral Daily  . predniSONE  10 mg Oral Daily  . sodium phosphate  1 enema Rectal Once  . traZODone  200 mg Oral QHS   Continuous Infusions: . dextrose 5 % and 0.9% NaCl 100 mL/hr at 05/17/20 0952  . piperacillin-tazobactam (ZOSYN)  IV 3.375 g (05/17/20 0956)  LOS: 3 days     Georgette Shell, MD 05/17/2020, 12:00 PM

## 2020-05-17 NOTE — Transfer of Care (Signed)
Immediate Anesthesia Transfer of Care Note  Patient: Rhett Najera  Procedure(s) Performed: Procedure(s): FLEXIBLE SIGMOIDOSCOPY (N/A)  Patient Location: PACU  Anesthesia Type:MAC  Level of Consciousness:  sedated, patient cooperative and responds to stimulation  Airway & Oxygen Therapy:Patient Spontanous Breathing and Patient connected to face mask oxgen  Post-op Assessment:  Report given to PACU RN and Post -op Vital signs reviewed and stable  Post vital signs:  Reviewed and stable  Last Vitals:  Vitals:   05/17/20 0940 05/17/20 1406  BP: 125/70 133/72  Pulse: (!) 50 (!) 50  Resp:  15  Temp:  36.5 C  SpO2:  84%    Complications: No apparent anesthesia complications

## 2020-05-17 NOTE — Progress Notes (Signed)
Day of Surgery Subjective: Denies abdominal pain. No nausea or emesis. Passing flatus and having bowel movements. Tolerating Foley. Foley is clear yellow.  Objective: Vital signs in last 24 hours: Temp:  [97.9 F (36.6 C)-98.2 F (36.8 C)] 97.9 F (36.6 C) (09/24 0500) Pulse Rate:  [50-54] 50 (09/24 0940) Resp:  [15-17] 17 (09/24 0500) BP: (92-132)/(51-70) 125/70 (09/24 0940) SpO2:  [88 %-98 %] 98 % (09/24 0500)  Intake/Output from previous day: 09/23 0701 - 09/24 0700 In: 240 [P.O.:240] Out: 1025 [Urine:1025] Intake/Output this shift: No intake/output data recorded.  Physical Exam:  General: Alert and oriented CV: RRR Lungs: Clear Abdomen: Soft, ND, nontender Ext: NT, No erythema  Lab Results: Recent Labs    05/14/20 1514 05/15/20 0210  HGB 13.0 10.8*  HCT 39.4 32.4*   BMET Recent Labs    05/14/20 1514 05/15/20 0210  NA 136 135  K 4.1 3.7  CL 95* 102  CO2 30 27  GLUCOSE 106* 87  BUN 16 15  CREATININE 1.86* 1.67*  CALCIUM 12.9* 11.2*     Studies/Results: No results found.  Assessment/Plan: 1. Colovesicular fistula:CT abdomen pelvis on 05/14/2020 with apparent colovesicular fistula with large amount of air in the bladder and adjacent diverticulitis with a contained perforation. Complaints of pneumaturia and sediment in her urine for 3 months. a. Urine cx multiple organisms. Repeat. Discharge home on course of abx to treat UTI. b. Will arrange for follow-up in the office in 1 to 2 weeks for repeat cystoscopy. c. General surgery to perform sigmoidoscopy today d. Discussed with Dr. Lucia Gaskins this morning. We'll plan for elective bowel obstruction and primary anastomosis as outpatient in several weeks. Will be available for bladder repair at time of surgery. e. Void trial today f. Anticipate discharge home over the weekend. 2. Microscopic hematuria: UA 9/21 with 11-20 RBC a. Cystoscopy 9/22 limited by debris in bladder. Will plan outpatient at  followup. b. Given significant smoking history (40 pack years) willneed to evaluate upper tracts. GFR only 33. Hold off on CT Urogram for now. Could evaluate with cystoscopy and bilateral retrograde pyelograms later vs MRI.   LOS: 3 days   Janith Lima 05/17/2020, 12:16 PM [facility]  Pager: [phone]

## 2020-05-17 NOTE — Plan of Care (Signed)
Pt  resting comfortably.  No complaints at this time.  Problem: Education: Goal: Knowledge of General Education information will improve Description: Including pain rating scale, medication(s)/side effects and non-pharmacologic comfort measures 05/17/2020 1109 by Hewitt Blade, Revia Nghiem D, RN Outcome: Progressing 05/17/2020 1108 by Hewitt Blade, Kasandra Fehr D, RN Outcome: Progressing   Problem: Health Behavior/Discharge Planning: Goal: Ability to manage health-related needs will improve 05/17/2020 1109 by Hewitt Blade, Samya Siciliano D, RN Outcome: Progressing 05/17/2020 1108 by Hewitt Blade, Leen Tworek D, RN Outcome: Progressing   Problem: Clinical Measurements: Goal: Ability to maintain clinical measurements within normal limits will improve 05/17/2020 1109 by Hewitt Blade, Chaos Carlile D, RN Outcome: Progressing 05/17/2020 1108 by Hewitt Blade, Roniqua Kintz D, RN Outcome: Progressing Goal: Diagnostic test results will improve 05/17/2020 1109 by Hewitt Blade, Benford Asch D, RN Outcome: Progressing 05/17/2020 1108 by Hewitt Blade, Halsey Hammen D, RN Outcome: Progressing Goal: Respiratory complications will improve 05/17/2020 1109 by Hewitt Blade, Kaine Mcquillen D, RN Outcome: Progressing 05/17/2020 1108 by Hewitt Blade, Elexis Pollak D, RN Outcome: Progressing Goal: Cardiovascular complication will be avoided 05/17/2020 1109 by Hewitt Blade, Ripley Lovecchio D, RN Outcome: Progressing 05/17/2020 1108 by Hewitt Blade, Aiyah Scarpelli D, RN Outcome: Progressing   Problem: Activity: Goal: Risk for activity intolerance will decrease 05/17/2020 1109 by Hewitt Blade, Rodd Heft D, RN Outcome: Progressing 05/17/2020 1108 by Hewitt Blade, Kadelyn Dimascio D, RN Outcome: Progressing   Problem: Nutrition: Goal: Adequate nutrition will be maintained 05/17/2020 1109 by Hewitt Blade, Noam Franzen D, RN Outcome: Progressing 05/17/2020 1108 by Hewitt Blade, Mark Hassey D, RN Outcome: Progressing   Problem: Coping: Goal: Level of  anxiety will decrease 05/17/2020 1109 by Hewitt Blade, Michelle Vanhise D, RN Outcome: Progressing 05/17/2020 1108 by Hewitt Blade, Rutilio Yellowhair D, RN Outcome: Progressing   Problem: Elimination: Goal: Will not experience complications related to urinary retention 05/17/2020 1109 by Hewitt Blade, Lexington Krotz D, RN Outcome: Progressing 05/17/2020 1108 by Hewitt Blade, Piotr Christopher D, RN Outcome: Progressing   Problem: Pain Managment: Goal: General experience of comfort will improve 05/17/2020 1109 by Hewitt Blade, Ren Aspinall D, RN Outcome: Progressing 05/17/2020 1108 by Hewitt Blade, Zeppelin Commisso D, RN Outcome: Progressing   Problem: Safety: Goal: Ability to remain free from injury will improve 05/17/2020 1109 by Hewitt Blade, Felicha Frayne D, RN Outcome: Progressing 05/17/2020 1108 by Hewitt Blade, Finesse Fielder D, RN Outcome: Progressing   Problem: Skin Integrity: Goal: Risk for impaired skin integrity will decrease 05/17/2020 1109 by Hewitt Blade, Jannel Lynne D, RN Outcome: Progressing 05/17/2020 1108 by Hewitt Blade, Laken Lobato D, RN Outcome: Progressing

## 2020-05-17 NOTE — Discharge Instructions (Signed)
   Please followup with [facility] in 1-2 weeks for office followup and cystoscopy. Please call [phone]) to confirm appointment.

## 2020-05-17 NOTE — Anesthesia Preprocedure Evaluation (Addendum)
Anesthesia Evaluation  Patient identified by MRN, date of birth, ID band Patient awake    Reviewed: Allergy & Precautions, NPO status , Patient's Chart, lab work & pertinent test results  Airway Mallampati: III  TM Distance: <3 FB Neck ROM: Full    Dental no notable dental hx.    Pulmonary sleep apnea , Current Smoker,    Pulmonary exam normal breath sounds clear to auscultation       Cardiovascular + CAD and + Past MI  Normal cardiovascular exam Rhythm:Regular Rate:Normal     Neuro/Psych Bipolar Disorder negative neurological ROS     GI/Hepatic negative GI ROS, Neg liver ROS,   Endo/Other  negative endocrine ROSdiabetes, Type 2  Renal/GU Renal InsufficiencyRenal disease  negative genitourinary   Musculoskeletal negative musculoskeletal ROS (+)   Abdominal   Peds negative pediatric ROS (+)  Hematology negative hematology ROS (+)   Anesthesia Other Findings   Reproductive/Obstetrics negative OB ROS                            Anesthesia Physical Anesthesia Plan  ASA: III  Anesthesia Plan: MAC   Post-op Pain Management:    Induction: Intravenous  PONV Risk Score and Plan: 0  Airway Management Planned: Simple Face Mask  Additional Equipment:   Intra-op Plan:   Post-operative Plan:   Informed Consent: I have reviewed the patients History and Physical, chart, labs and discussed the procedure including the risks, benefits and alternatives for the proposed anesthesia with the patient or authorized representative who has indicated his/her understanding and acceptance.     Dental advisory given  Plan Discussed with: CRNA and Surgeon  Anesthesia Plan Comments:         Anesthesia Quick Evaluation

## 2020-05-17 NOTE — Care Management Important Message (Signed)
Important Message  Patient Details IM Letter given to the Patient Name: Valerie [name] MRN: [id number] Date of Birth: Dec 31, 1960   Medicare Important Message Given:  Yes     Kerin Salen 05/17/2020, 10:11 AM

## 2020-05-17 NOTE — Progress Notes (Signed)
PT Cancellation Note  Patient Details Name: Valerie [name] MRN: [id number] DOB: [birth year]   Cancelled Treatment:    Reason Eval/Treat Not Completed: Patient at procedure or test/unavailable   Mckenzy Salazar,KATHrine E 05/17/2020, 3:03 PM Arlyce Dice, DPT Acute Rehabilitation Services Pager: [phone] Office: [phone]

## 2020-05-17 NOTE — Anesthesia Postprocedure Evaluation (Signed)
Anesthesia Post Note  [name]  Procedure(s) Performed: FLEXIBLE SIGMOIDOSCOPY (N/A )     Patient location during evaluation: PACU Anesthesia Type: MAC Level of consciousness: awake and alert Pain management: pain level controlled Vital Signs Assessment: post-procedure vital signs reviewed and stable Respiratory status: spontaneous breathing, nonlabored ventilation, respiratory function stable and patient connected to nasal cannula oxygen Cardiovascular status: stable and blood pressure returned to baseline Postop Assessment: no apparent nausea or vomiting Anesthetic complications: no   No complications documented.  Last Vitals:  Vitals:   05/17/20 1406 05/17/20 1630  BP: 133/72 (!) 167/101  Pulse: (!) 50 64  Resp: 15 13  Temp: 36.5 C 36.6 C  SpO2: 92% 96%    Last Pain:  Vitals:   05/17/20 1630  TempSrc: Axillary  PainSc: 0-No pain                 Ashlynn Gunnels S

## 2020-05-18 LAB — GLUCOSE, CAPILLARY
Glucose-Capillary: 107 mg/dL — ABNORMAL HIGH (ref 70–99)
Glucose-Capillary: 109 mg/dL — ABNORMAL HIGH (ref 70–99)
Glucose-Capillary: 110 mg/dL — ABNORMAL HIGH (ref 70–99)
Glucose-Capillary: 88 mg/dL (ref 70–99)

## 2020-05-18 MED ORDER — AMOXICILLIN-POT CLAVULANATE 875-125 MG PO TABS: 1.0000 | ORAL_TABLET | Freq: Two times a day (BID) | ORAL | 0 refills | Status: AC

## 2020-05-18 MED ORDER — SODIUM CHLORIDE 0.9 % IV SOLN: INTRAVENOUS | Status: DC

## 2020-05-18 NOTE — Progress Notes (Signed)
Assessment & Plan: Psoriatic arthritis, Immunosuppressed on prednisone, methotrexate, plaquenil, and Enbrel - last had enbrel and MTX 1 week ago             Dr. Otelia Santee [facility] [phone]), her PCP, manages her psoriatic arthritis Colovesical fistula             - cystoscopy 9/22 Dr. Rodman Key Gay(Significant amount of debris in the bladder limiting the study. No large bladder mass identified. Plans to repeat study in the future before surgery) H/O MI/CAD - March 2021 H/O CVA             -on plavix.              This can be restarted since there is no immediate plan for surgery CKD stage III             - creatinine 1.86>>1.67 OSA Type II DM with peripheral neuropathy Multiple falls; severe deconditioning             - Using cane/walker/wheelchair since November 2020 Moderate malnutrition - prealbumin 12.4 -05/16/2020  Sigmoid colitis with possible microperforation, stricture - colovesical fistula  - flex sig by Dr. Benson Norway with no evidence of malignancy, ?benign stricture  - follow up at [facility] with Dr. Neysa Bonito to be arranged  - follow up at [facility] with Dr. Abner Greenspan for cystoscopy  - medical team to wean steroids and immunosuppressives prior to surgery  Anticipating discharge home later today.        Armandina Gemma, MD       [facility]       Office: [phone]   Chief Complaint: Colovesical fistula  Subjective: Patient in bed, nurse in room.  No complaints.  Anticipating discharge home later today.  Objective: Vital signs in last 24 hours: Temp:  [97.7 F (36.5 C)-99.2 F (37.3 C)] 99.2 F (37.3 C) (09/25 0541) Pulse Rate:  [50-64] 57 (09/25 0541) Resp:  [13-21] 20 (09/25 0541) BP: (114-173)/(61-107) 131/81 (09/25 0541) SpO2:  [92 %-97 %] 92 % (09/25 0541) Last BM Date: 05/17/20  Intake/Output from previous day: 09/24 0701 - 09/25 0700 In: 2809 [I.V.:2809] Out: 2750 [Urine:2750] Intake/Output this shift: No  intake/output data recorded.  Physical Exam: HEENT - sclerae clear, mucous membranes moist Neck - soft Abdomen - soft, obese, non-tender Neuro - alert & oriented, no focal deficits  Lab Results:  No results for input(s): WBC, HGB, HCT, PLT in the last 72 hours. BMET No results for input(s): NA, K, CL, CO2, GLUCOSE, BUN, CREATININE, CALCIUM in the last 72 hours. PT/INR No results for input(s): LABPROT, INR in the last 72 hours. Comprehensive Metabolic Panel:    Component Value Date/Time   NA 135 05/15/2020 0210   NA 136 05/14/2020 1514   K 3.7 05/15/2020 0210   K 4.1 05/14/2020 1514   CL 102 05/15/2020 0210   CL 95 (L) 05/14/2020 1514   CO2 27 05/15/2020 0210   CO2 30 05/14/2020 1514   BUN 15 05/15/2020 0210   BUN 16 05/14/2020 1514   CREATININE 1.67 (H) 05/15/2020 0210   CREATININE 1.86 (H) 05/14/2020 1514   CREATININE 1.73 (H) 02/08/2018 1020   GLUCOSE 87 05/15/2020 0210   GLUCOSE 106 (H) 05/14/2020 1514   CALCIUM 11.2 (H) 05/15/2020 0210   CALCIUM 12.9 (H) 05/14/2020 1514   AST 22 05/14/2020 1514   AST 14 (L) 02/20/2020 1138   ALT 14 05/14/2020 1514   ALT 14  02/20/2020 1138   ALKPHOS 72 05/14/2020 1514   ALKPHOS 63 02/20/2020 1138   BILITOT 0.5 05/14/2020 1514   BILITOT 0.7 02/20/2020 1138   PROT 6.4 (L) 05/14/2020 1514   PROT 6.1 (L) 02/20/2020 1138   ALBUMIN 3.7 05/14/2020 1514   ALBUMIN 3.2 (L) 02/20/2020 1138    Studies/Results: No results found.    Armandina Gemma 05/18/2020  Patient ID: Valerie [name], female   DOB: [birth year], 60 y.o.   MRN: [id number]

## 2020-05-18 NOTE — Discharge Summary (Addendum)
Physician Discharge Summary  Valerie [name] TIR:[id number] DOB: [birth year] DOA: 05/14/2020  PCP: Otelia Santee I, MD  Admit date: 05/14/2020 Discharge date: 05/19/20 Admitted From: Home Disposition: Home  recommendations for Outpatient Follow-up:  1. Follow up with PCP in 1-2 weeks 2. Please obtain BMP/CBC in one week 3. Please follow up with [facility] surgery Dr. Johney Maine 4. Follow-up with urology Dr. Abner Greenspan 5. Follow-up with GI Dr. Dallas Breeding. [facility] none Equipment/Devices: None Discharge Condition: Stable CODE STATUS: Full code Diet recommendation cardiac diet Brief/Interim Summary:GlendaTrexleris a59 y.o.Caucasian femalewith a known history of type 2 diabetes mellitus, peripheral neuropathy, stage III chronic kidney disease, obstructive sleep apnea, coronary artery disease and psoriatic arthritis, presented to the emergency room with acute onset of abdominal pain with associated constipation for about a week for which she took prune juice, Dulcolax and Colace with only small bowel movements and later had nausea and vomiting. Prior to that the patient had a fall about 3 weeks ago and was given pain medications including oxycodone for left sided rib pain and upper abdominal pain and dosewere thought to be the culprit for her constipation. No dyspnea or cough or wheezing. No dysuria, oliguria or hematuria or flank pain. No chest pain or palpitations.She has not been vaccinated for COVID-19 however she is enrolled in a trial with [employer] since January that apparently involves intrusion intake. No recent exposure to COVID-19. She has upper and lower extremity bruising that she relates to her neuropathy and partly due to her fall.  Upon presentation to the emergency room, blood pressure was 97/72 with otherwise normal vital signs. Labs revealed a creatinine of 1.86 close to baseline with a BUN of 16, calcium was 12.9 with albumin of 3.7 with otherwise unremarkable CMP. CBC  showed relative macrocytosis. Abdominal pelvic CT scan showed the following: 1. Large amount of stool throughout the colon with an abrupt transition point at the sigmoid colon. At this location, there is circumferential wall thickening with adjacent inflammatory changes. Findings may be secondary to colitis/diverticulitis. There are findings suspicious for contained perforation abutting the urinary bladder. However, an underlying mass is not excluded and should be further evaluated with colonoscopy. 2. 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 3. 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 Dr. Abner Greenspan from urology did a cystoscopy which was limited by a lot of debris's.  He plans to do an outpatient cystoscopy. Discussed in detail with Dr. Lucia Gaskins with general surgery plan is to discharge her home on p.o. antibiotics for 7 to 10 days on Augmentin and she will follow up with Dr. Johney Maine as an outpatient for possible colectomy/colostomy.  We will stop all her immunosuppressants for 4 to 6 weeks prior to surgery.  I have called PCP Dr. Christianne Dolin office left a message yesterday to call me back at the time of the dictation I have not received a call back from him. She will follow up with Dr. Abner Greenspan and Dr. Jerrell Belfast and GI. She also had a flex sig 05/18/2020 which basically showed strictures with no evidence of malignancy. Patient will be discharged on a regular diet without a Foley catheter.  CT abdomen  pelvis-Large amount of stool throughout the colon with an abrupt transition point at  the sigmoid colon. At this location, there is circumferential wall thickening with adjacent inflammatory changes. Findings may be secondary to colitis/diverticulitis. There are findings suspicious for contained perforation abutting the urinary bladder. However, an underlying mass is not excluded and should be further evaluated with colonoscopy. 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 Age-indeterminate compression fracture of the T9 vertebral body  #2 type 2 diabetes-her hemoglobin A1c was 5.4 and she is not on any medications at this time.  Her hyperglycemia might have been induced by chronic steroids.  But throughout the hospital stay her blood sugars have been stable without any need for insulin.  #3 history of stroke on Plavix continue  #4 psoriatic arthritis patient is on methotrexate, prednisone 10 mg daily, Plaquenil and Enbrel which is on hold during the hospital stay and on discharge.  Patient to follow-up with PCP if any worsening of her arthritis.  This is on hold in preparation for surgery.  #5 obstructive sleep apnea patient uses nasal CPAP at home continue.  #6 history of CKD stage IIIb stable during the hospital stay  #7 history of CAD and MI March 2021 continue Plavix  # multiple falls with severe deconditioning at baseline she uses cane or a walker or a wheelchair since November 2020.   Nutrition Problem: Inadequate oral intake Etiology: nausea, vomiting, constipation    Signs/Symptoms: per patient/family report     Interventions: Refer to RD note for recommendations  Estimated body mass index is 36.58 kg/m as calculated from the following:   Height as of this encounter: 5\' 2"  (1.575 m).   Weight as of this encounter: 90.7 kg.  Discharge Instructions  Discharge Instructions    Diet - low sodium heart healthy   Complete by: As directed    Increase activity  slowly   Complete by: As directed      Allergies as of 05/18/2020      Reactions   Carbamazepine Other (See Comments)   Parkinsons like symptoms tremors   Sertraline Hcl Other (See Comments)   Unknown reaction      Medication List    STOP taking these medications   Enbrel 50 MG/ML injection Generic drug: etanercept   hydroxychloroquine 200 MG tablet Commonly known as: PLAQUENIL   methotrexate 2.5 MG tablet Commonly known as: RHEUMATREX   predniSONE 10 MG tablet Commonly known as: DELTASONE   valACYclovir 500 MG tablet Commonly known as: VALTREX     TAKE these medications   ALPRAZolam 1 MG tablet Commonly known as: XANAX Take 1 mg by mouth 3 (three) times daily.   amoxicillin-clavulanate 875-125 MG tablet Commonly known as: Augmentin Take 1 tablet by mouth 2 (two) times daily for 10 days.   aspirin EC 81 MG tablet Take 1 tablet (81 mg total) by mouth daily.   b complex vitamins tablet Take 2 tablets by mouth at bedtime.   clopidogrel 75 MG tablet Commonly known as: PLAVIX Take 75 mg by mouth daily.   docusate sodium 100 MG capsule Commonly known as: COLACE Take 100-200 mg by mouth at bedtime.   donepezil 10 MG tablet Commonly known as: ARICEPT Take 10 mg by mouth daily.   escitalopram 20 MG tablet Commonly known as: LEXAPRO Take 30 mg by mouth daily.   HYDROcodone-acetaminophen 10-325 MG tablet Commonly known as: NORCO Take 1 tablet  by mouth 2 (two) times daily as needed (pain).   lamoTRIgine 150 MG tablet Commonly known as: LAMICTAL Take 75-150 mg by mouth See admin instructions. Take 1/2 tablet (75 mg) by mouth every morning and 1 tablet (150 mg) at night   levocetirizine 5 MG tablet Commonly known as: XYZAL Take 5 mg by mouth daily.   nadolol 20 MG tablet Commonly known as: Corgard Take 0.5 tablets (10 mg total) by mouth daily.   nitroGLYCERIN 0.4 MG SL tablet Commonly known as: NITROSTAT Place 1 tablet (0.4 mg total) under the tongue  every 5 (five) minutes as needed for chest pain.   omeprazole 20 MG capsule Commonly known as: PRILOSEC Take 20 mg by mouth daily.   rizatriptan 10 MG disintegrating tablet Commonly known as: MAXALT-MLT Take 10 mg by mouth See admin instructions. Take one tablet (10 mg) by mouth daily as needed for "floaters", May repeat in 2 hours if still needed   rosuvastatin 5 MG tablet Commonly known as: CRESTOR Take 1 tablet (5 mg total) by mouth daily.   traZODone 100 MG tablet Commonly known as: DESYREL Take 200 mg by mouth at bedtime.   vitamin C 1000 MG tablet Take 1,000 mg by mouth at bedtime.   Vitamin D-3 25 MCG (1000 UT) Caps Take 1,000 Units by mouth 2 (two) times daily.       Follow-up Information    Michael Boston, MD Follow up in 3 week(s).   Specialty: General Surgery Contact information: [address] [place] [address] [phone]        Otelia Santee I, MD Follow up.   Specialty: Internal Medicine Why: i have stopped her rheumatoid medications in preparation for surgery soon.?colectomy and colostomy..monitor for uti.she has a colovescical fistula. Contact information: [place] [address] [phone]        [name], MD .   Specialties: Cardiology, Radiology Contact information: [address] [place] [address] [place] [address] [phone]        Janith Lima, MD Follow up.   Specialty: Urology Contact information: [address] [place] [address] [phone]              Allergies  Allergen Reactions  . Carbamazepine Other (See Comments)    Parkinsons like symptoms tremors  . Sertraline Hcl Other (See Comments)    Unknown reaction    Consultations:  Dr. Abner Greenspan urology  Dr. Lucia Gaskins surgery  Dr. Benson Norway GI   Procedures/Studies: CT ABDOMEN PELVIS WO CONTRAST  Result Date: 05/14/2020 CLINICAL DATA:  Epigastric pain.  Pain status post fall EXAM: CT ABDOMEN AND PELVIS WITHOUT  CONTRAST TECHNIQUE: Multidetector CT imaging of the abdomen and pelvis was performed following the standard protocol without IV contrast. COMPARISON:  None. FINDINGS: Lower chest: The lung bases are clear.  The heart size is normal. Hepatobiliary: The liver is normal. Status post cholecystectomy.There is no biliary ductal dilation. Pancreas: Normal contours without ductal dilatation. No peripancreatic fluid collection. Spleen: Unremarkable. Adrenals/Urinary Tract: --Adrenal glands: Unremarkable. --Right kidney/ureter: No hydronephrosis or radiopaque kidney stones. --Left kidney/ureter: No hydronephrosis or radiopaque kidney stones. --Urinary bladder: There is asymmetric urinary bladder wall thickening. There is a moderate to large volume of air within the urinary bladder. Stomach/Bowel: --Stomach/Duodenum: No hiatal hernia or other gastric abnormality. Normal duodenal course and caliber. --Small bowel: Unremarkable. --Colon: There is a large amount of stool throughout colon. There is a relatively abrupt transition point the level of the sigmoid colon  where there are adjacent inflammatory changes circumferential a wall thickening. There is a questionable contained perforation of the sigmoid colon abutting the urinary bladder, measuring approximately 2.1 cm on the coronal series. This is not amenable to percutaneous drainage given its small size. --Appendix: Not visualized. No right lower quadrant inflammation or free fluid. Vascular/Lymphatic: Normal course and caliber of the major abdominal vessels. --No retroperitoneal lymphadenopathy. --No mesenteric lymphadenopathy. --No pelvic or inguinal lymphadenopathy. Reproductive: Status post hysterectomy. No adnexal mass. Other: There is a small volume of free fluid in the patient's pelvis. The abdominal wall is normal. Musculoskeletal. There is an age-indeterminate compression fracture of the T9 vertebral body with approximately 20% height loss anteriorly. The patient is  status post bilateral total hip arthroplasty. There is extensive streak artifact through the patient's pelvis that limits evaluation of this region. IMPRESSION: 1. Large amount of stool throughout the colon with an abrupt transition point at the sigmoid colon. At this location, there is circumferential wall thickening with adjacent inflammatory changes. Findings may be secondary to colitis/diverticulitis. There are findings suspicious for contained perforation abutting the urinary bladder. However, an underlying mass is not excluded and should be further evaluated with colonoscopy. 2. 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 3. Age-indeterminate compression fracture of the T9 vertebral body. Aortic Atherosclerosis (ICD10-I70.0). Electronically Signed   By: Constance Holster M.D.   On: 05/14/2020 18:29   DG Chest 2 View  Result Date: 05/14/2020 CLINICAL DATA:  Cough. EXAM: CHEST - 2 VIEW COMPARISON:  November 13, 2019. FINDINGS: The heart size and mediastinal contours are within normal limits. Both lungs are clear. The visualized skeletal structures are unremarkable. IMPRESSION: No active cardiopulmonary disease. Electronically Signed   By: Marijo Conception M.D.   On: 05/14/2020 18:22    (Echo, Carotid, EGD, Colonoscopy, ERCP)    Subjective:  Patient resting in bed nasal CPAP on no new complaints anxious to go home  Discharge Exam: Vitals:   05/17/20 1938 05/18/20 0541  BP: 121/66 131/81  Pulse: (!) 55 (!) 57  Resp: 16 20  Temp:  99.2 F (37.3 C)  SpO2: 92% 92%   Vitals:   05/17/20 1650 05/17/20 1836 05/17/20 1938 05/18/20 0541  BP: (!) 173/107 114/61 121/66 131/81  Pulse: (!) 56 (!) 53 (!) 55 (!) 57  Resp: 17  16 20   Temp:    99.2 F (37.3 C)  TempSrc:    Oral  SpO2: 97%  92% 92%  Weight:      Height:        General: Pt is alert, awake, not in acute distress Cardiovascular: RRR, S1/S2  +, no rubs, no gallops Respiratory: CTA bilaterally, no wheezing, no rhonchi Abdominal: Soft, NT, ND, bowel sounds + Extremities: no edema, no cyanosis    The results of significant diagnostics from this hospitalization (including imaging, microbiology, ancillary and laboratory) are listed below for reference.     Microbiology: Recent Results (from the past 240 hour(s))  SARS Coronavirus 2 by RT PCR (hospital order, performed in [facility] hospital lab) Nasopharyngeal Nasopharyngeal Swab     Status: None   Collection Time: 05/14/20  7:18 PM   Specimen: Nasopharyngeal Swab  Result Value Ref Range Status   SARS Coronavirus 2 NEGATIVE NEGATIVE Final    Comment: (NOTE) SARS-CoV-2 target nucleic acids are NOT DETECTED.  The SARS-CoV-2 RNA is generally detectable in upper and lower respiratory specimens during  the acute phase of infection. The lowest concentration of SARS-CoV-2 viral copies this assay can detect is 250 copies / mL. A negative result does not preclude SARS-CoV-2 infection and should not be used as the sole basis for treatment or other patient management decisions.  A negative result may occur with improper specimen collection / handling, submission of specimen other than nasopharyngeal swab, presence of viral mutation(s) within the areas targeted by this assay, and inadequate number of viral copies (<250 copies / mL). A negative result must be combined with clinical observations, patient history, and epidemiological information.  Fact Sheet for Patients:   StrictlyIdeas.no  Fact Sheet for Healthcare Providers: BankingDealers.co.za  This test is not yet approved or  cleared by the [place] FDA and has been authorized for detection and/or diagnosis of SARS-CoV-2 by FDA under an Emergency Use Authorization (EUA).  This EUA will remain in effect (meaning this test can be used) for the duration of the COVID-19  declaration under Section 564(b)(1) of the Act, 21 U.S.C. section 360bbb-3(b)(1), unless the authorization is terminated or revoked sooner.  Performed at [facility], [place] [address] [place], [place] [address]   Culture, Urine     Status: Abnormal   Collection Time: 05/15/20  3:13 PM   Specimen: Urine, Random  Result Value Ref Range Status   Specimen Description   Final    URINE, RANDOM Performed at [facility] [address] [place], [place] [address]    Special Requests   Final    NONE Performed at [facility], [place] [address], [place], [place] [address]    Culture MULTIPLE SPECIES PRESENT, SUGGEST RECOLLECTION (A)  Final   Report Status 05/16/2020 FINAL  Final     Labs: BNP (last 3 results) No results for input(s): BNP in the last 8760 hours. Basic Metabolic Panel: Recent Labs  Lab 05/14/20 1514 05/15/20 0210  NA 136 135  K 4.1 3.7  CL 95* 102  CO2 30 27  GLUCOSE 106* 87  BUN 16 15  CREATININE 1.86* 1.67*  CALCIUM 12.9* 11.2*   Liver Function Tests: Recent Labs  Lab 05/14/20 1514  AST 22  ALT 14  ALKPHOS 72  BILITOT 0.5  PROT 6.4*  ALBUMIN 3.7   Recent Labs  Lab 05/14/20 1514  LIPASE 26   No results for input(s): AMMONIA in the last 168 hours. CBC: Recent Labs  Lab 05/14/20 1514 05/15/20 0210  WBC 9.9 9.1  HGB 13.0 10.8*  HCT 39.4 32.4*  MCV 106.8* 106.2*  PLT 261 181   Cardiac Enzymes: No results for input(s): CKTOTAL, CKMB, CKMBINDEX, TROPONINI in the last 168 hours. BNP: Invalid input(s): POCBNP CBG: Recent Labs  Lab 05/17/20 0757 05/17/20 1144 05/17/20 1743 05/17/20 2205 05/18/20 0728  GLUCAP 90 126* 111* 119* 107*   D-Dimer No results for input(s): DDIMER in the last 72 hours. Hgb A1c No results for input(s): HGBA1C in the last 72 hours. Lipid Profile No results for input(s): CHOL, HDL, LDLCALC, TRIG, CHOLHDL, LDLDIRECT in the last 72 hours. Thyroid function  studies No results for input(s): TSH, T4TOTAL, T3FREE, THYROIDAB in the last 72 hours.  Invalid input(s): FREET3 Anemia work up No results for input(s): VITAMINB12, FOLATE, FERRITIN, TIBC, IRON, RETICCTPCT in the last 72 hours. Urinalysis    Component Value Date/Time   COLORURINE YELLOW 05/14/2020 2256   APPEARANCEUR CLOUDY (A) 05/14/2020 2256   LABSPEC 1.010 05/14/2020 2256   PHURINE 5.0 05/14/2020 2256  GLUCOSEU NEGATIVE 05/14/2020 2256   HGBUR MODERATE (A) 05/14/2020 2256   BILIRUBINUR NEGATIVE 05/14/2020 2256   KETONESUR NEGATIVE 05/14/2020 2256   PROTEINUR 100 (A) 05/14/2020 2256   NITRITE NEGATIVE 05/14/2020 2256   LEUKOCYTESUR LARGE (A) 05/14/2020 2256   Sepsis Labs Invalid input(s): PROCALCITONIN,  WBC,  LACTICIDVEN Microbiology Recent Results (from the past 240 hour(s))  SARS Coronavirus 2 by RT PCR (hospital order, performed in [facility] hospital lab) Nasopharyngeal Nasopharyngeal Swab     Status: None   Collection Time: 05/14/20  7:18 PM   Specimen: Nasopharyngeal Swab  Result Value Ref Range Status   SARS Coronavirus 2 NEGATIVE NEGATIVE Final    Comment: (NOTE) SARS-CoV-2 target nucleic acids are NOT DETECTED.  The SARS-CoV-2 RNA is generally detectable in upper and lower respiratory specimens during the acute phase of infection. The lowest concentration of SARS-CoV-2 viral copies this assay can detect is 250 copies / mL. A negative result does not preclude SARS-CoV-2 infection and should not be used as the sole basis for treatment or other patient management decisions.  A negative result may occur with improper specimen collection / handling, submission of specimen other than nasopharyngeal swab, presence of viral mutation(s) within the areas targeted by this assay, and inadequate number of viral copies (<250 copies / mL). A negative result must be combined with clinical observations, patient history, and epidemiological information.  Fact Sheet for  Patients:   StrictlyIdeas.no  Fact Sheet for Healthcare Providers: BankingDealers.co.za  This test is not yet approved or  cleared by the [place] FDA and has been authorized for detection and/or diagnosis of SARS-CoV-2 by FDA under an Emergency Use Authorization (EUA).  This EUA will remain in effect (meaning this test can be used) for the duration of the COVID-19 declaration under Section 564(b)(1) of the Act, 21 U.S.C. section 360bbb-3(b)(1), unless the authorization is terminated or revoked sooner.  Performed at [facility], [place] [address] [place], [place] [address]   Culture, Urine     Status: Abnormal   Collection Time: 05/15/20  3:13 PM   Specimen: Urine, Random  Result Value Ref Range Status   Specimen Description   Final    URINE, RANDOM Performed at [facility] [address], [place], [place] [address]    Special Requests   Final    NONE Performed at [facility], [place] [address], [place],  [address]    Culture MULTIPLE SPECIES PRESENT, SUGGEST RECOLLECTION (A)  Final   Report Status 05/16/2020 FINAL  Final     Time coordinating discharge:  39 minutes  SIGNED:   Georgette Shell, MD  [facility] 05/18/2020, 9:50 AM

## 2020-05-18 NOTE — Progress Notes (Signed)
PROGRESS NOTE    Valerie [name]  WUJ:[id number] DOB: 1961/04/22 DOA: 05/14/2020 PCP: Ophelia Shoulder, MD  Brief Narrative:Valerie [name]  is a 59 y.o. Caucasian female with a known history of type 2 diabetes mellitus, peripheral neuropathy, stage III chronic kidney disease, obstructive sleep apnea, coronary artery disease and psoriatic arthritis, presented to the emergency room with acute onset of abdominal pain with associated constipation for about a week for which she took prune juice, Dulcolax and Colace with only small bowel movements and later had nausea and vomiting.  Prior to that the patient had a fall about 3 weeks ago and was given pain medications including oxycodone for left sided rib pain and upper abdominal pain and dose were thought to be the culprit for her constipation.  No dyspnea or cough or wheezing.  No dysuria, oliguria or hematuria or flank pain.  No chest pain or palpitations. She has not been vaccinated for COVID-19 however she is enrolled in a trial with [employer] since January that apparently involves intrusion intake. No recent exposure to COVID-19. She has upper and lower extremity bruising that she relates to her neuropathy and partly due to her fall.  Upon presentation to the emergency room, blood pressure was 97/72 with otherwise normal vital signs.  Labs revealed a creatinine of 1.86 close to baseline with a BUN of 16, calcium was 12.9 with albumin of 3.7 with otherwise unremarkable CMP.  CBC showed relative macrocytosis.  Abdominal pelvic CT scan showed the following:  1. Large amount of stool throughout the colon with an abrupt transition point at the sigmoid colon. At this location, there is circumferential wall thickening with adjacent inflammatory changes. Findings may be secondary to colitis/diverticulitis. There are findings suspicious for contained perforation abutting the urinary bladder. However, an underlying mass is not excluded and should be further  evaluated with colonoscopy. 2. 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 3. 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  Plan to redo cystoscopy as an outpatient. Patient to have flex sig today.  Urine culture with mixed growth.  Discussed with Dr. Johnnye Sima will plan to send her home on Augmentin.  I have left a message with patient's primary care physician Dr. Dimas Millin to call me back. She was scheduled to be discharged 05/18/2020.  However she threw up clear liquid diet and was very weak and the daughter was very concerned to take her home today.  They expressed a desire to stay another night.  I will put her on slow IV hydration. CT abdomen pelvis-Large amount of stool throughout the colon with an abrupt transition point at the sigmoid colon. At this location, there is circumferential wall thickening with adjacent inflammatory changes. Findings may be secondary to colitis/diverticulitis. There are findings suspicious for contained perforation abutting the urinary bladder. However, an underlying mass is not excluded and should be further evaluated with colonoscopy. 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 Age-indeterminate compression fracture of the T9 vertebral body Surgery following Urology consulted GI consulted Continue IV Zosyn n.p.o. and IV fluids  #2 type 2 diabetes-patient is n.p.o. continue SSI on D5 normal  saline since she became hypoglycemic. CBG (last 3)  Recent Labs    05/17/20 2205 05/18/20 0728 05/18/20 1155  GLUCAP 119* 107*  109*     #3 history of stroke on Plavix  #4 history of psoriatic arthritis on multiple meds including methotrexate Plaquenil and Enbrel and prednisone   Estimated body mass index is 36.58 kg/m as calculated from the following:   Height as of this encounter: 5\' 2"  (1.575 m).   Weight as of this encounter: 90.7 kg.  DVT prophylaxis: scd Code Status: full Family Communication:dw daughter Disposition Plan:  Status is: Inpatient  Dispo: The patient is from: Home              Anticipated d/c is to: Home              Anticipated d/c date is: > 3 days              Patient currently is not medically stable to d/c. Consultants: GI, general surgery, urology   Procedures: None Antimicrobials: Zosyn  Subjective: Patient was scheduled to be discharged today.  Patient started throwing up after taking a clear liquid diet.  Objective: Vitals:   05/17/20 1836 05/17/20 1938 05/18/20 0541 05/18/20 1315  BP: 114/61 121/66 131/81 104/89  Pulse: (!) 53 (!) 55 (!) 57 63  Resp:  16 20 18   Temp:   99.2 F (37.3 C) 99 F (37.2 C)  TempSrc:   Oral Oral  SpO2:  92% 92% 93%  Weight:      Height:        Intake/Output Summary (Last 24 hours) at 05/18/2020 1458 Last data filed at 05/18/2020 1100 Gross per 24 hour  Intake 2808.95 ml  Output 2850 ml  Net -41.05 ml   Filed Weights   05/14/20 1434  Weight: 90.7 kg    Examination: Foley draining cloudy urine  General exam: Appears calm and comfortable  Respiratory system: Clear to auscultation. Respiratory effort normal. Cardiovascular system: S1 & S2 heard, RRR. No JVD, murmurs, rubs, gallops or clicks. No pedal edema. Gastrointestinal system: Abdomen is distended, soft and nontender. No organomegaly or masses felt. Normal bowel sounds heard. Central nervous system: Alert and oriented. No focal neurological deficits. Extremities: Symmetric 5 x 5 power. Skin: No rashes, lesions or ulcers Psychiatry: Judgement and insight appear normal.  Mood & affect appropriate.     Data Reviewed: I have personally reviewed following labs and imaging studies  CBC: Recent Labs  Lab 05/14/20 1514 05/15/20 0210  WBC 9.9 9.1  HGB 13.0 10.8*  HCT 39.4 32.4*  MCV 106.8* 106.2*  PLT 261 161   Basic Metabolic Panel: Recent Labs  Lab 05/14/20 1514 05/15/20 0210  NA 136 135  K 4.1 3.7  CL 95* 102  CO2 30 27  GLUCOSE 106* 87  BUN 16 15  CREATININE 1.86* 1.67*  CALCIUM 12.9* 11.2*   GFR: Estimated Creatinine Clearance: 37.5 mL/min (A) (by C-G formula based on SCr of 1.67 mg/dL (H)). Liver Function Tests: Recent Labs  Lab 05/14/20 1514  AST 22  ALT 14  ALKPHOS 72  BILITOT 0.5  PROT 6.4*  ALBUMIN 3.7   Recent Labs  Lab 05/14/20 1514  LIPASE 26   No results for input(s): AMMONIA in the last 168 hours. Coagulation Profile: No results for input(s): INR, PROTIME in the last 168 hours. Cardiac Enzymes: No results for input(s): CKTOTAL, CKMB, CKMBINDEX, TROPONINI in the last 168 hours. BNP (last 3 results) No results for input(s):  PROBNP in the last 8760 hours. HbA1C: No results for input(s): HGBA1C in the last 72 hours. CBG: Recent Labs  Lab 05/17/20 1144 05/17/20 1743 05/17/20 2205 05/18/20 0728 05/18/20 1155  GLUCAP 126* 111* 119* 107* 109*   Lipid Profile: No results for input(s): CHOL, HDL, LDLCALC, TRIG, CHOLHDL, LDLDIRECT in the last 72 hours. Thyroid Function Tests: No results for input(s): TSH, T4TOTAL, FREET4, T3FREE, THYROIDAB in the last 72 hours. Anemia Panel: No results for input(s): VITAMINB12, FOLATE, FERRITIN, TIBC, IRON, RETICCTPCT in the last 72 hours. Sepsis Labs: No results for input(s): PROCALCITON, LATICACIDVEN in the last 168 hours.  Recent Results (from the past 240 hour(s))  SARS Coronavirus 2 by RT PCR (hospital order, performed in [facility] hospital lab) Nasopharyngeal Nasopharyngeal Swab     Status: None   Collection Time: 05/14/20  7:18 PM   Specimen: Nasopharyngeal Swab   Result Value Ref Range Status   SARS Coronavirus 2 NEGATIVE NEGATIVE Final    Comment: (NOTE) SARS-CoV-2 target nucleic acids are NOT DETECTED.  The SARS-CoV-2 RNA is generally detectable in upper and lower respiratory specimens during the acute phase of infection. The lowest concentration of SARS-CoV-2 viral copies this assay can detect is 250 copies / mL. A negative result does not preclude SARS-CoV-2 infection and should not be used as the sole basis for treatment or other patient management decisions.  A negative result may occur with improper specimen collection / handling, submission of specimen other than nasopharyngeal swab, presence of viral mutation(s) within the areas targeted by this assay, and inadequate number of viral copies (<250 copies / mL). A negative result must be combined with clinical observations, patient history, and epidemiological information.  Fact Sheet for Patients:   StrictlyIdeas.no  Fact Sheet for Healthcare Providers: BankingDealers.co.za  This test is not yet approved or  cleared by the [place] FDA and has been authorized for detection and/or diagnosis of SARS-CoV-2 by FDA under an Emergency Use Authorization (EUA).  This EUA will remain in effect (meaning this test can be used) for the duration of the COVID-19 declaration under Section 564(b)(1) of the Act, 21 U.S.C. section 360bbb-3(b)(1), unless the authorization is terminated or revoked sooner.  Performed at [facility], [place] [address] [place], [place] [address]   Culture, Urine     Status: Abnormal   Collection Time: 05/15/20  3:13 PM   Specimen: Urine, Random  Result Value Ref Range Status   Specimen Description   Final    URINE, RANDOM Performed at [facility] [address], [place], [place] [address]    Special Requests   Final    NONE Performed at [facility], [place] [address] [place], [place] [address]    Culture MULTIPLE SPECIES PRESENT, SUGGEST RECOLLECTION (A)  Final   Report Status 05/16/2020 FINAL  Final         Radiology Studies: No results found.      Scheduled Meds: . Chlorhexidine Gluconate Cloth  6 each Topical Daily  . docusate sodium  100-200 mg Oral QHS  . hydroxychloroquine  200 mg Oral Daily  . insulin aspart  0-9 Units Subcutaneous TID PC & HS  . lamoTRIgine  150 mg Oral QHS  . lamoTRIgine  75 mg Oral q morning - 10a  . nadolol  10 mg Oral Daily  . pantoprazole  40 mg Oral Daily  . predniSONE  10 mg Oral Daily  . traZODone  200 mg Oral QHS  Continuous Infusions: . dextrose 5 % and 0.9% NaCl 100 mL/hr at 05/18/20 0815  . piperacillin-tazobactam (ZOSYN)  IV 3.375 g (05/18/20 1029)     LOS: 4 days     Georgette Shell, MD 05/18/2020, 2:58 PM

## 2020-05-18 NOTE — Progress Notes (Signed)
Md was notified of daughter's concern.  Her mother cannot seem to tolerate full liquids, only clears.  Started dry heaving while trying to ingest clears. Feels mother is too weak to go home today.

## 2020-05-18 NOTE — Evaluation (Signed)
Physical Therapy Evaluation Patient Details Name: Valerie [name] MRN: [id number] DOB: 1961/05/23 Today's Date: 05/18/2020   History of Present Illness  59 yo female admitted with colitis, weakness. Hx of DM, neuropathy, CKD, obesity, falls, bipolar d/o, NSTEMI, DM, psoriatic arthritis  Clinical Impression  On eval, pt was mostly Min guard assist for mobility. Pt presents with general weakness, decreased activity tolerance, and impaired gait and balance. Pt reports feeling weak for about 3 weeks PTA. She reports having difficulty performing ADLs at home. Discussed d/c plan-pt is agreeable to HHPT and HHOT. Will plan to follow pt during hospital stay.     Follow Up Recommendations Home health PT;Home Health OT    Equipment Recommendations  None recommended by PT    Recommendations for Other Services       Precautions / Restrictions Precautions Precautions: Fall Restrictions Weight Bearing Restrictions: No      Mobility  Bed Mobility Overal bed mobility: Needs Assistance Bed Mobility: Supine to Sit     Supine to sit: Min assist;HOB elevated     General bed mobility comments: small amount of assist for trunk to upright. increased time.  Transfers Overall transfer level: Needs assistance Equipment used: Rolling walker (2 wheeled) Transfers: Sit to/from Stand Sit to Stand: Min guard         General transfer comment: min guard for safety.  Ambulation/Gait Ambulation/Gait assistance: Min guard Gait Distance (Feet): 60 Feet Assistive device: Rolling walker (2 wheeled) Gait Pattern/deviations: Step-through pattern;Decreased stride length     General Gait Details: min guard for safety. distance limited by fatigue and "legs feeling weak."  Stairs            Wheelchair Mobility    Modified Rankin (Stroke Patients Only)       Balance Overall balance assessment: Needs assistance         Standing balance support: Bilateral upper extremity  supported Standing balance-Leahy Scale: Poor                               Pertinent Vitals/Pain Pain Assessment: No/denies pain    Home Living Family/patient expects to be discharged to:: Private residence Living Arrangements: Other (Comment) Available Help at Discharge: Friend(s) Type of Home: House Home Access: Stairs to enter   [employer] of Steps: 1 Home Layout: Bed/bath upstairs;Two level Home Equipment: Walker - 2 wheels;Cane - single point;Wheelchair - manual      Prior Function Level of Independence: Needs assistance   Gait / Transfers Assistance Needed: using RW vs cane inside home. wheelchair outside           Hand Dominance        Extremity/Trunk Assessment   Upper Extremity Assessment Upper Extremity Assessment: Generalized weakness    Lower Extremity Assessment Lower Extremity Assessment: Generalized weakness    Cervical / Trunk Assessment Cervical / Trunk Assessment: Normal  Communication   Communication: No difficulties  Cognition Arousal/Alertness: Awake/alert Behavior During Therapy: WFL for tasks assessed/performed Overall Cognitive Status: Within Functional Limits for tasks assessed                                        General Comments      Exercises     Assessment/Plan    PT Assessment Patient needs continued PT services  PT Problem List Decreased strength;Decreased mobility;Decreased activity tolerance;Decreased balance;Decreased  knowledge of use of DME       PT Treatment Interventions DME instruction;Gait training;Therapeutic activities;Therapeutic exercise;Patient/family education;Balance training;Functional mobility training    PT Goals (Current goals can be found in the Care Plan section)  Acute Rehab PT Goals Patient Stated Goal: home PT Goal Formulation: With patient Time For Goal Achievement: 06/01/20 Potential to Achieve Goals: Good    Frequency Min 3X/week   Barriers  to discharge        Co-evaluation               AM-PAC PT "6 Clicks" Mobility  Outcome Measure Help needed turning from your back to your side while in a flat bed without using bedrails?: A Little Help needed moving from lying on your back to sitting on the side of a flat bed without using bedrails?: A Little Help needed moving to and from a bed to a chair (including a wheelchair)?: A Little Help needed standing up from a chair using your arms (e.g., wheelchair or bedside chair)?: A Little Help needed to walk in hospital room?: A Little Help needed climbing 3-5 steps with a railing? : A Little 6 Click Score: 18    End of Session Equipment Utilized During Treatment: Gait belt Activity Tolerance: Patient tolerated treatment well Patient left: in chair;with call bell/phone within reach   PT Visit Diagnosis: Muscle weakness (generalized) (M62.81);History of falling (Z91.81)    Time: 8099-8338 PT Time Calculation (min) (ACUTE ONLY): 14 min   Charges:   PT Evaluation $PT Eval Low Complexity: [place], PT Acute Rehabilitation  Office: [phone] Pager: [phone]

## 2020-05-19 LAB — GLUCOSE, CAPILLARY
Glucose-Capillary: 75 mg/dL (ref 70–99)
Glucose-Capillary: 94 mg/dL (ref 70–99)

## 2020-05-19 LAB — URINE CULTURE

## 2020-05-19 MED ORDER — ONDANSETRON HCL 4 MG PO TABS: 4.0000 mg | ORAL_TABLET | Freq: Every day | ORAL | 1 refills | Status: DC | PRN

## 2020-05-19 NOTE — Progress Notes (Signed)
Reviewed AVS and answered all questions.  Patient alert and oriented, skin warm and dry.  Discharged from the front lobby to care of Daughter without incident or difficulty.

## 2020-05-19 NOTE — TOC Initial Note (Signed)
Transition of Care [facility]) - Initial/Assessment Note    Patient Details  Name: Valerie [name] MRN: [id number] Date of Birth: [birth year]  Transition of Care (TOC) CM/SW Contact:    Joaquin Courts, RN Phone Number: 05/19/2020, 10:54 AM  Clinical Narrative:    Patient [facility] up with [facility] for HHPT.                Expected Discharge Plan: [place] Barriers to Discharge: No Barriers Identified   Patient Goals and CMS Choice Patient states their goals for this hospitalization and ongoing recovery are:: to go home with therapy CMS Medicare.gov Compare Post Acute Care list provided to:: Patient Choice offered to / list presented to : Patient  Expected Discharge Plan and Services Expected Discharge Plan: [place]   Discharge Planning Services: CM Consult Post Acute Care Choice: [place] arrangements for the past 2 months: Single Family Home Expected Discharge Date: 05/19/20               DME Arranged: N/A DME Agency: NA       HH Arranged: PT HH Agency: [facility] Date [place]: 05/19/20 Time [place]: 1054 Representative spoke with at [place]: [place] Arrangements/Services Living arrangements for the past 2 months: [place]   Patient language and need for interpreter reviewed:: Yes Do you feel safe going back to the place where you live?: Yes      Need for Family Participation in Patient Care: Yes (Comment) Care giver support system in place?: Yes (comment)   Criminal Activity/Legal Involvement Pertinent to Current Situation/Hospitalization: No - Comment as needed  Activities of Daily Living Home Assistive Devices/Equipment: Wheelchair, Radio producer (specify quad or straight), Eyeglasses ADL Screening (condition at time of admission) Patient's cognitive ability adequate to safely complete daily activities?: Yes Is the patient deaf or have difficulty hearing?: No Does the  patient have difficulty seeing, even when wearing glasses/contacts?: No Does the patient have difficulty concentrating, remembering, or making decisions?: No Patient able to express need for assistance with ADLs?: Yes Does the patient have difficulty dressing or bathing?: Yes Independently performs ADLs?: No Communication: Independent Dressing (OT): Needs assistance Is this a change from baseline?: Pre-admission baseline Grooming: Independent Feeding: Independent Bathing: Needs assistance Is this a change from baseline?: Pre-admission baseline Toileting: Needs assistance Is this a change from baseline?: Pre-admission baseline In/Out Bed: Needs assistance Is this a change from baseline?: Pre-admission baseline Walks in Home: Needs assistance Is this a change from baseline?: Pre-admission baseline Does the patient have difficulty walking or climbing stairs?: Yes Weakness of Legs: Both Weakness of Arms/Hands: Both  Permission Sought/Granted                  Emotional Assessment Appearance:: Appears stated age Attitude/Demeanor/Rapport: Engaged Affect (typically observed): Accepting Orientation: : Oriented to Self, Oriented to Place, Oriented to  Time, Oriented to Situation   Psych Involvement: No (comment)  Admission diagnosis:  Acute colitis [K52.9] Intra-abdominal infection [B99.9] Patient Active Problem List   Diagnosis Date Noted  . Intra-abdominal infection   . Acute colitis 05/14/2020  . NSTEMI (non-ST elevated myocardial infarction) ([place]) 11/14/2019  . DM type 2 (diabetes mellitus, type 2) ([place]) 11/14/2019  . Depression   . Anxiety   . Chest pain   . Obesity, Class III, BMI 40-49.9 (morbid obesity) ([place])   . CKD (chronic kidney disease), stage III   . Psoriatic arthritis ([place])   .  Tobacco abuse   . OSA (obstructive sleep apnea)   . Neuropathy   . Bipolar disorder ([place])    PCP:  Ophelia Shoulder, MD Pharmacy:   [facility] [place], [place] [address] Phone: [phone] Fax: [phone]     Social Determinants of Health (SDOH) Interventions    Readmission Risk Interventions No flowsheet data found.

## 2020-05-20 ENCOUNTER — Encounter: Payer: Self-pay | Admitting: Surgery

## 2020-05-20 ENCOUNTER — Encounter (HOSPITAL_COMMUNITY): Payer: Self-pay | Admitting: Gastroenterology

## 2020-05-20 DIAGNOSIS — K56699 Other intestinal obstruction unspecified as to partial versus complete obstruction: Secondary | ICD-10-CM

## 2020-05-20 DIAGNOSIS — N321 Vesicointestinal fistula: Secondary | ICD-10-CM | POA: Insufficient documentation

## 2020-05-20 HISTORY — DX: Other intestinal obstruction unspecified as to partial versus complete obstruction: K56.699

## 2020-05-20 LAB — GLUCOSE, CAPILLARY: Glucose-Capillary: 105 mg/dL — ABNORMAL HIGH (ref 70–99)

## 2020-05-21 NOTE — Telephone Encounter (Signed)
Spoke with patient, advised patient of the need for repeat lipid panel to be drawn. Patient stated she was just hospitalized and that her discharge papers states she needs to follow up with her cardiologist. Appointment scheduled 10/14 at 11:40am. Patient advised to come in for blood work the week before, and that she needs to be fasting for these labs. Orders placed. Patient verbalized understanding.

## 2020-05-30 ENCOUNTER — Telehealth: Payer: Self-pay | Admitting: Internal Medicine

## 2020-05-30 NOTE — Telephone Encounter (Signed)
[name] with [facility] states the patient will be having lab work completed with [facility] soon and the patient would like to have all lab work completed with them. [name] is requesting to have lipid and comprehensive metabolic panel faxed to [facility] at fax number [phone].

## 2020-05-30 NOTE — Telephone Encounter (Signed)
Spoke to [name] with [facility].Advised ok for patient to have a Lipid Panel and CMET done tomorrow 05/31/20 with [facility].Note faxed to [facility] at fax # [phone].Advised to fax Dr.Acharya results at fax # [phone].

## 2020-06-03 ENCOUNTER — Inpatient Hospital Stay (HOSPITAL_COMMUNITY)
Admission: EM | Admit: 2020-06-03 | Discharge: 2020-06-13 | DRG: 690 | Disposition: A | Discharge status: Home / Self Care | Payer: Medicare Other | Attending: Internal Medicine | Admitting: Internal Medicine

## 2020-06-03 ENCOUNTER — Encounter (HOSPITAL_COMMUNITY): Payer: Self-pay | Admitting: Emergency Medicine

## 2020-06-03 ENCOUNTER — Emergency Department (HOSPITAL_COMMUNITY): Payer: Medicare Other

## 2020-06-03 DIAGNOSIS — Z79899 Other long term (current) drug therapy: Secondary | ICD-10-CM

## 2020-06-03 DIAGNOSIS — I959 Hypotension, unspecified: Secondary | ICD-10-CM | POA: Diagnosis present

## 2020-06-03 DIAGNOSIS — F419 Anxiety disorder, unspecified: Secondary | ICD-10-CM | POA: Diagnosis present

## 2020-06-03 DIAGNOSIS — N390 Urinary tract infection, site not specified: Secondary | ICD-10-CM | POA: Diagnosis not present

## 2020-06-03 DIAGNOSIS — R001 Bradycardia, unspecified: Secondary | ICD-10-CM | POA: Diagnosis present

## 2020-06-03 DIAGNOSIS — I252 Old myocardial infarction: Secondary | ICD-10-CM

## 2020-06-03 DIAGNOSIS — R627 Adult failure to thrive: Secondary | ICD-10-CM | POA: Diagnosis present

## 2020-06-03 DIAGNOSIS — F319 Bipolar disorder, unspecified: Secondary | ICD-10-CM | POA: Diagnosis present

## 2020-06-03 DIAGNOSIS — N321 Vesicointestinal fistula: Secondary | ICD-10-CM | POA: Diagnosis not present

## 2020-06-03 DIAGNOSIS — K5720 Diverticulitis of large intestine with perforation and abscess without bleeding: Secondary | ICD-10-CM | POA: Diagnosis present

## 2020-06-03 DIAGNOSIS — E669 Obesity, unspecified: Secondary | ICD-10-CM | POA: Diagnosis present

## 2020-06-03 DIAGNOSIS — D849 Immunodeficiency, unspecified: Secondary | ICD-10-CM | POA: Diagnosis present

## 2020-06-03 DIAGNOSIS — N1832 Chronic kidney disease, stage 3b: Secondary | ICD-10-CM | POA: Diagnosis present

## 2020-06-03 DIAGNOSIS — R296 Repeated falls: Secondary | ICD-10-CM | POA: Diagnosis present

## 2020-06-03 DIAGNOSIS — R413 Other amnesia: Secondary | ICD-10-CM | POA: Diagnosis present

## 2020-06-03 DIAGNOSIS — E119 Type 2 diabetes mellitus without complications: Secondary | ICD-10-CM

## 2020-06-03 DIAGNOSIS — D539 Nutritional anemia, unspecified: Secondary | ICD-10-CM

## 2020-06-03 DIAGNOSIS — R4182 Altered mental status, unspecified: Secondary | ICD-10-CM | POA: Diagnosis present

## 2020-06-03 DIAGNOSIS — G4733 Obstructive sleep apnea (adult) (pediatric): Secondary | ICD-10-CM | POA: Diagnosis present

## 2020-06-03 DIAGNOSIS — Z7982 Long term (current) use of aspirin: Secondary | ICD-10-CM

## 2020-06-03 DIAGNOSIS — F17200 Nicotine dependence, unspecified, uncomplicated: Secondary | ICD-10-CM | POA: Diagnosis present

## 2020-06-03 DIAGNOSIS — L4050 Arthropathic psoriasis, unspecified: Secondary | ICD-10-CM | POA: Diagnosis present

## 2020-06-03 DIAGNOSIS — Z6836 Body mass index (BMI) 36.0-36.9, adult: Secondary | ICD-10-CM

## 2020-06-03 DIAGNOSIS — R531 Weakness: Secondary | ICD-10-CM

## 2020-06-03 DIAGNOSIS — Z7952 Long term (current) use of systemic steroids: Secondary | ICD-10-CM

## 2020-06-03 DIAGNOSIS — E440 Moderate protein-calorie malnutrition: Secondary | ICD-10-CM | POA: Insufficient documentation

## 2020-06-03 DIAGNOSIS — I2510 Atherosclerotic heart disease of native coronary artery without angina pectoris: Secondary | ICD-10-CM

## 2020-06-03 DIAGNOSIS — Z8673 Personal history of transient ischemic attack (TIA), and cerebral infarction without residual deficits: Secondary | ICD-10-CM

## 2020-06-03 DIAGNOSIS — B9620 Unspecified Escherichia coli [E. coli] as the cause of diseases classified elsewhere: Secondary | ICD-10-CM | POA: Diagnosis present

## 2020-06-03 DIAGNOSIS — Z7902 Long term (current) use of antithrombotics/antiplatelets: Secondary | ICD-10-CM

## 2020-06-03 DIAGNOSIS — E1142 Type 2 diabetes mellitus with diabetic polyneuropathy: Secondary | ICD-10-CM | POA: Diagnosis present

## 2020-06-03 DIAGNOSIS — Z20822 Contact with and (suspected) exposure to covid-19: Secondary | ICD-10-CM | POA: Diagnosis present

## 2020-06-03 DIAGNOSIS — E1122 Type 2 diabetes mellitus with diabetic chronic kidney disease: Secondary | ICD-10-CM | POA: Diagnosis present

## 2020-06-03 LAB — CBC
HCT: 38.6 % (ref 36.0–46.0)
Hemoglobin: 11.3 g/dL — ABNORMAL LOW (ref 12.0–15.0)
MCH: 32.7 pg (ref 26.0–34.0)
MCHC: 29.3 g/dL — ABNORMAL LOW (ref 30.0–36.0)
MCV: 111.6 fL — ABNORMAL HIGH (ref 80.0–100.0)
Platelets: 175 10*3/uL (ref 150–400)
RBC: 3.46 MIL/uL — ABNORMAL LOW (ref 3.87–5.11)
RDW: 15 % (ref 11.5–15.5)
WBC: 13.1 10*3/uL — ABNORMAL HIGH (ref 4.0–10.5)
nRBC: 0 % (ref 0.0–0.2)

## 2020-06-03 LAB — URINALYSIS, ROUTINE W REFLEX MICROSCOPIC
Bilirubin Urine: NEGATIVE
Glucose, UA: NEGATIVE mg/dL
Ketones, ur: NEGATIVE mg/dL
Nitrite: POSITIVE — AB
Protein, ur: NEGATIVE mg/dL
Specific Gravity, Urine: 1.004 — ABNORMAL LOW (ref 1.005–1.030)
WBC, UA: >50 WBC/hpf — ABNORMAL HIGH (ref 0–5)
pH: 6 (ref 5.0–8.0)

## 2020-06-03 LAB — BASIC METABOLIC PANEL
Anion gap: 11 (ref 5–15)
BUN: 12 mg/dL (ref 6–20)
CO2: 30 mmol/L (ref 22–32)
Calcium: 11.5 mg/dL — ABNORMAL HIGH (ref 8.9–10.3)
Chloride: 102 mmol/L (ref 98–111)
Creatinine, Ser: 1.8 mg/dL — ABNORMAL HIGH (ref 0.44–1.00)
GFR, Estimated: 30 mL/min — ABNORMAL LOW (ref >60)
Glucose, Bld: 95 mg/dL (ref 70–99)
Potassium: 3.7 mmol/L (ref 3.5–5.1)
Sodium: 143 mmol/L (ref 135–145)

## 2020-06-03 NOTE — ED Triage Notes (Signed)
Pt arrives to ED via [facility] from home where family has been taking care of her since coming home from hospital after being treated for sepsis. Pt reports she has had multiple falls while at home she is on plavix, pt did fall today at 3am due to generalized weakness. Family reports over the last few days she has also seemed to be more confused at times.

## 2020-06-04 ENCOUNTER — Emergency Department (HOSPITAL_COMMUNITY): Payer: Medicare Other

## 2020-06-04 DIAGNOSIS — E1140 Type 2 diabetes mellitus with diabetic neuropathy, unspecified: Secondary | ICD-10-CM

## 2020-06-04 DIAGNOSIS — E440 Moderate protein-calorie malnutrition: Secondary | ICD-10-CM | POA: Diagnosis present

## 2020-06-04 DIAGNOSIS — G4733 Obstructive sleep apnea (adult) (pediatric): Secondary | ICD-10-CM | POA: Diagnosis present

## 2020-06-04 DIAGNOSIS — Z6836 Body mass index (BMI) 36.0-36.9, adult: Secondary | ICD-10-CM | POA: Diagnosis not present

## 2020-06-04 DIAGNOSIS — N321 Vesicointestinal fistula: Secondary | ICD-10-CM

## 2020-06-04 DIAGNOSIS — F419 Anxiety disorder, unspecified: Secondary | ICD-10-CM | POA: Diagnosis present

## 2020-06-04 DIAGNOSIS — B9620 Unspecified Escherichia coli [E. coli] as the cause of diseases classified elsewhere: Secondary | ICD-10-CM | POA: Diagnosis present

## 2020-06-04 DIAGNOSIS — K5720 Diverticulitis of large intestine with perforation and abscess without bleeding: Secondary | ICD-10-CM | POA: Diagnosis present

## 2020-06-04 DIAGNOSIS — L4050 Arthropathic psoriasis, unspecified: Secondary | ICD-10-CM | POA: Diagnosis present

## 2020-06-04 DIAGNOSIS — E1142 Type 2 diabetes mellitus with diabetic polyneuropathy: Secondary | ICD-10-CM | POA: Diagnosis present

## 2020-06-04 DIAGNOSIS — D539 Nutritional anemia, unspecified: Secondary | ICD-10-CM | POA: Diagnosis present

## 2020-06-04 DIAGNOSIS — R627 Adult failure to thrive: Secondary | ICD-10-CM | POA: Diagnosis present

## 2020-06-04 DIAGNOSIS — I2510 Atherosclerotic heart disease of native coronary artery without angina pectoris: Secondary | ICD-10-CM

## 2020-06-04 DIAGNOSIS — E669 Obesity, unspecified: Secondary | ICD-10-CM | POA: Diagnosis present

## 2020-06-04 DIAGNOSIS — I959 Hypotension, unspecified: Secondary | ICD-10-CM | POA: Diagnosis present

## 2020-06-04 DIAGNOSIS — R413 Other amnesia: Secondary | ICD-10-CM | POA: Diagnosis present

## 2020-06-04 DIAGNOSIS — F17200 Nicotine dependence, unspecified, uncomplicated: Secondary | ICD-10-CM | POA: Diagnosis present

## 2020-06-04 DIAGNOSIS — E1122 Type 2 diabetes mellitus with diabetic chronic kidney disease: Secondary | ICD-10-CM | POA: Diagnosis present

## 2020-06-04 DIAGNOSIS — R001 Bradycardia, unspecified: Secondary | ICD-10-CM | POA: Diagnosis present

## 2020-06-04 DIAGNOSIS — R531 Weakness: Secondary | ICD-10-CM

## 2020-06-04 DIAGNOSIS — I252 Old myocardial infarction: Secondary | ICD-10-CM | POA: Diagnosis not present

## 2020-06-04 DIAGNOSIS — N390 Urinary tract infection, site not specified: Secondary | ICD-10-CM | POA: Diagnosis present

## 2020-06-04 DIAGNOSIS — Z20822 Contact with and (suspected) exposure to covid-19: Secondary | ICD-10-CM | POA: Diagnosis present

## 2020-06-04 DIAGNOSIS — R296 Repeated falls: Secondary | ICD-10-CM | POA: Diagnosis present

## 2020-06-04 DIAGNOSIS — F319 Bipolar disorder, unspecified: Secondary | ICD-10-CM | POA: Diagnosis present

## 2020-06-04 DIAGNOSIS — D849 Immunodeficiency, unspecified: Secondary | ICD-10-CM | POA: Diagnosis present

## 2020-06-04 LAB — GLUCOSE, CAPILLARY
Glucose-Capillary: 94 mg/dL (ref 70–99)
Glucose-Capillary: 113 mg/dL — ABNORMAL HIGH (ref 70–99)

## 2020-06-04 LAB — TRIGLYCERIDES: Triglycerides: 120 mg/dL (ref <150)

## 2020-06-04 LAB — VITAMIN B12: Vitamin B-12: 872 pg/mL (ref 180–914)

## 2020-06-04 LAB — PREALBUMIN: Prealbumin: 15.6 mg/dL — ABNORMAL LOW (ref 18–38)

## 2020-06-04 LAB — CBG MONITORING, ED
Glucose-Capillary: 81 mg/dL (ref 70–99)
Glucose-Capillary: 82 mg/dL (ref 70–99)

## 2020-06-04 LAB — RESPIRATORY PANEL BY RT PCR (FLU A&B, COVID)
Influenza A by PCR: NEGATIVE
Influenza B by PCR: NEGATIVE
SARS Coronavirus 2 by RT PCR: NEGATIVE

## 2020-06-04 LAB — CORTISOL-AM, BLOOD: Cortisol - AM: 13.9 ug/dL (ref 6.7–22.6)

## 2020-06-04 LAB — PROCALCITONIN: Procalcitonin: <0.1 ng/mL

## 2020-06-04 LAB — LACTIC ACID, PLASMA
Lactic Acid, Venous: 0.4 mmol/L — ABNORMAL LOW (ref 0.5–1.9)
Lactic Acid, Venous: 0.5 mmol/L (ref 0.5–1.9)

## 2020-06-04 LAB — HEMOGLOBIN A1C
Hgb A1c MFr Bld: 4.9 % (ref 4.8–5.6)
Mean Plasma Glucose: 93.93 mg/dL

## 2020-06-04 LAB — TSH: TSH: 1.604 u[IU]/mL (ref 0.350–4.500)

## 2020-06-04 LAB — FOLATE: Folate: 6.1 ng/mL (ref >5.9)

## 2020-06-04 MED ORDER — ENOXAPARIN SODIUM 40 MG/0.4ML ~~LOC~~ SOLN
40.0000 mg | SUBCUTANEOUS | Status: DC
  Administered 2020-06-04: 40 mg via SUBCUTANEOUS
  Filled 2020-06-04: qty 0.4

## 2020-06-04 MED ORDER — SODIUM CHLORIDE 0.9% FLUSH
10.0000 mL | Freq: Two times a day (BID) | INTRAVENOUS | Status: DC
  Administered 2020-06-04 – 2020-06-12 (×12): 10 mL

## 2020-06-04 MED ORDER — ONDANSETRON HCL 4 MG PO TABS
4.0000 mg | ORAL_TABLET | Freq: Four times a day (QID) | ORAL | Status: DC | PRN
  Administered 2020-06-05 – 2020-06-09 (×3): 4 mg via ORAL
  Filled 2020-06-04 (×3): qty 1

## 2020-06-04 MED ORDER — OXYCODONE HCL 5 MG PO TABS
5.0000 mg | ORAL_TABLET | Freq: Four times a day (QID) | ORAL | Status: DC | PRN
  Administered 2020-06-04 – 2020-06-11 (×7): 5 mg via ORAL
  Filled 2020-06-04 (×8): qty 1

## 2020-06-04 MED ORDER — ACETAMINOPHEN 325 MG PO TABS
650.0000 mg | ORAL_TABLET | ORAL | Status: DC | PRN
  Administered 2020-06-05 – 2020-06-11 (×2): 650 mg via ORAL
  Filled 2020-06-04 (×3): qty 2

## 2020-06-04 MED ORDER — INSULIN ASPART 100 UNIT/ML ~~LOC~~ SOLN: 0.0000 [IU] | SUBCUTANEOUS | Status: DC

## 2020-06-04 MED ORDER — INSULIN ASPART 100 UNIT/ML ~~LOC~~ SOLN
0.0000 [IU] | SUBCUTANEOUS | Status: DC
  Administered 2020-06-05 (×4): 1 [IU] via SUBCUTANEOUS
  Administered 2020-06-05: 2 [IU] via SUBCUTANEOUS

## 2020-06-04 MED ORDER — PIPERACILLIN-TAZOBACTAM 3.375 G IVPB 30 MIN
3.3750 g | Freq: Once | INTRAVENOUS | Status: AC
  Administered 2020-06-04: 3.375 g via INTRAVENOUS
  Filled 2020-06-04: qty 50

## 2020-06-04 MED ORDER — PIPERACILLIN-TAZOBACTAM 3.375 G IVPB
3.3750 g | Freq: Three times a day (TID) | INTRAVENOUS | Status: DC
  Administered 2020-06-04 – 2020-06-05 (×4): 3.375 g via INTRAVENOUS
  Filled 2020-06-04 (×4): qty 50

## 2020-06-04 MED ORDER — PREDNISONE 10 MG PO TABS
10.0000 mg | ORAL_TABLET | Freq: Every day | ORAL | Status: DC
  Administered 2020-06-04 – 2020-06-13 (×10): 10 mg via ORAL
  Filled 2020-06-04 (×10): qty 1

## 2020-06-04 MED ORDER — SENNOSIDES-DOCUSATE SODIUM 8.6-50 MG PO TABS
1.0000 | ORAL_TABLET | Freq: Two times a day (BID) | ORAL | Status: DC
  Administered 2020-06-04 – 2020-06-12 (×16): 1 via ORAL
  Filled 2020-06-04 (×18): qty 1

## 2020-06-04 MED ORDER — IOHEXOL 9 MG/ML PO SOLN
500.0000 mL | ORAL | Status: AC
  Administered 2020-06-04: 500 mL via ORAL

## 2020-06-04 MED ORDER — SODIUM CHLORIDE 0.9% FLUSH
10.0000 mL | INTRAVENOUS | Status: DC | PRN
  Administered 2020-06-10: 20 mL
  Administered 2020-06-11: 10 mL

## 2020-06-04 MED ORDER — ACETAMINOPHEN 325 MG PO TABS: 650.0000 mg | ORAL_TABLET | Freq: Four times a day (QID) | ORAL | Status: DC | PRN

## 2020-06-04 MED ORDER — CHLORHEXIDINE GLUCONATE CLOTH 2 % EX PADS
6.0000 | MEDICATED_PAD | Freq: Every day | CUTANEOUS | Status: DC
  Administered 2020-06-04 – 2020-06-12 (×7): 6 via TOPICAL

## 2020-06-04 MED ORDER — TRAVASOL 10 % IV SOLN
INTRAVENOUS | Status: AC
  Filled 2020-06-04: qty 518.4

## 2020-06-04 MED ORDER — SODIUM CHLORIDE 0.9 % IV BOLUS
500.0000 mL | Freq: Once | INTRAVENOUS | Status: AC
  Administered 2020-06-04: 500 mL via INTRAVENOUS

## 2020-06-04 MED ORDER — ONDANSETRON HCL 4 MG/2ML IJ SOLN
4.0000 mg | Freq: Four times a day (QID) | INTRAMUSCULAR | Status: DC | PRN
  Administered 2020-06-06 – 2020-06-07 (×2): 4 mg via INTRAVENOUS
  Filled 2020-06-04 (×3): qty 2

## 2020-06-04 MED ORDER — ACETAMINOPHEN 650 MG RE SUPP: 650.0000 mg | Freq: Four times a day (QID) | RECTAL | Status: DC | PRN

## 2020-06-04 MED ORDER — NYSTATIN 100000 UNIT/GM EX POWD
Freq: Three times a day (TID) | CUTANEOUS | Status: DC
  Filled 2020-06-04: qty 15

## 2020-06-04 MED ORDER — IOHEXOL 9 MG/ML PO SOLN
ORAL | Status: AC
  Administered 2020-06-04: 500 mL via ORAL
  Filled 2020-06-04: qty 1000

## 2020-06-04 NOTE — Progress Notes (Signed)
PHARMACY - TOTAL PARENTERAL NUTRITION CONSULT NOTE   Indication: Colovesicular Fistula  Patient Measurements: Height: 5\' 2"  (157.5 cm) Weight: 90.3 kg (199 lb) IBW/kg (Calculated) : 50.1 TPN AdjBW (KG): 60.1 Body mass index is 36.4 kg/m. Usual Weight: 104kg  Assessment:  59 yo W recently admitted 05/14/25 for fistula now readmitted with worsening weakness, falls, lethargy, nausea, confusion and poor PO intake. PICC was placed outpatient 10/7 for TPN with first TPN to start 10/12. Plan for colectomy in 4-6 weeks after weaning off immunosuppresants. Poor PO intake since ~05/08/20 with documented weight loss of 14kg over 6 months. Patient is at risk for refeeding syndrome.   Glucose / Insulin: BG 81-95 on chronic prednisone 10mg . A1C 5.4, no diabetic meds PTA.  Electrolytes: Na, K WNL; Ca 11.5 high; no mag or phos available Renal: Scr 1.8, ClCr ~34 ml/min LFTs / TGs: LFT wnl (9/21), TG 202 (3/23) Prealbumin / albumin: 12.4/ 3.7 Intake / Output; MIVF: NS 558ml x1 GI Imaging: 9/21 CT Abd: Large stool burden, possible colitis/diverticulitis, highly suspicious colovesicular fistula  10/12 CT abd: L colonic diverticulitis, likely colovesical fistula and paracolonic abscess Surgeries / Procedures:  9/24 Flex sig: sigmoid stricture and diverticulosis   Central access: 10/7 PICC  TPN start date: 10/12   Nutritional Goals (pending RD recommendation) kCal: 1700-2300, Protein: 80-110, Fluid: ~2L/d Goal TPN rate is 80 mL/hr (provides 103 g of protein and 2093 kcals per day)  Current Nutrition:  TPN  Plan:  Start TPN at 63mL/hr at 1800 (Will provide 52g AA, 154g Dextrose, 32g Lipid, 1045 kcal)  Electrolytes in TPN: 75 mEq/L of Na, 78mEq/L of K, 0 mEq/L of Ca, 43mEq/L of Mg, and 54mmol/L of Phos. Cl:Ac ratio 1:1 Add standard MVI and trace elements to TPN Initiate Sensitive q4h SSI and adjust as needed  Monitor TPN labs on Mon/Thurs   Benetta Spar, PharmD, BCPS, BCCP Clinical  Pharmacist  Please check AMION for all [place] phone numbers After 10:00 PM, call [place]

## 2020-06-04 NOTE — Assessment & Plan Note (Signed)
-   follows outpatient with cardiology; s/p previous NSTEMI - continue asa, BB, statin

## 2020-06-04 NOTE — ED Notes (Signed)
This nurse noticed yeast around her lower abdominal fold. MD Girguis notified.

## 2020-06-04 NOTE — Progress Notes (Signed)
[facility] Surgery Consult Note  Valerie [name] March 01, 1961  [id number].    Requesting MD: Thayer Jew Chief Complaint:  Multiple falls secondary to general weakness, chronic nausea and abdominal discomfort, unable to eat, starting home TNA, intermittent confusion, Reason for Consult: Left colonic diverticulosis with proximal sigmoid diverticulitis/probable colovesicular fistula.  HPI:  The patient is a 59 year old female who presented in 05/14/2020, with falls, nausea and vomiting. She has multiple medical problems and is on multiple medications for psoriatic arthritis. 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 She was found to have episodes of pneumaturia for 3 months, prior to admission. She was seen by Urology, Dr. Rexene Alberts, and underwent cystoscopy which showed significant amount of debris in the bladder no large mass was identified. She was placed on IV antibiotics. She underwent flexible sigmoidoscopy on 05/17/2020 by Dr. Benson Norway. This revealed stricture in the sigmoid colon that was able to be transversed. There was no evidence of malignancy or fistula on his limited exam.  She was discharged on 05/19/2020, on antibiotics, plans for follow-up with Dr. Abner Greenspan and repeat cystoscopy and follow-up with Dr. Johney Maine for possible colon resection. The plan was to get her off her immunosuppressive medications, allow her time to recover from her current infection, and work on her nutritional status prior to surgery.  She completed her antibiotics on 05/28/2020.  She returns now with the above complaints. She is afebrile she has had 1 low blood pressure, 92/55 this AM. Other vital signs are stable. Creatinine is 1.8, WBC 13.1, H/H 11.3/38.6, platelets 175,000, respiratory panel is negative. Urinalysis shows > 50 WBC/hpf, and many bacteria. CT of the abdomen and pelvis without contrast shows left colonic diverticulosis with  subtle pericolonic inflammation associated with the proximal sigmoid. There are 2 different tracks of soft tissue between proximal to mid sigmoid and the bladder dome. Either tract could be a possible colovesicular fistula. There is also a collection of debris and gas between the anterior wall of the sigmoid colon and mid bladder which could be an abscess or intramural bladder wall infection. We are asked to see. She saw Dr. Abner Greenspan last week and had a cystoscopy "and reassuring urine." Those records are not in [place]. She was due to see Dr. Johney Maine today. She is essentially had failure to thrive, ongoing nausea and abdominal pain, she is not eating and a PICC was placed last week and she is to start TNA this week.   ROS: Review of Systems  Constitutional: Positive for malaise/fatigue and weight loss (not eating PCP has placed a PICC with home TNA to start today). Negative for chills and fever.       She has had essentially failure to thrive since she went home from the hospital 05/19/20. Weak, and falling, could not stand at times.  HENT: Negative.   Eyes: Positive for blurred vision (had this issue before 05/14/20 admit, she thinks it's worse now).  Respiratory: Negative.   Cardiovascular: Negative.   Gastrointestinal: Positive for abdominal pain (She complains of generalized abdominal pain which is fairly constant.), blood in stool (She has had blood in her stools in the past but none recently.), constipation (She describes her stools as being like "little worms."), nausea (daily even with daily Zofran) and vomiting. Negative for heartburn.  Genitourinary: Positive for frequency, hematuria and urgency.       She is wearing depends garments at home because she cannot get to the bathroom in time.  She denies dysuria, or odor with voiding.  She has recently had some blood in her urine.  Musculoskeletal: Positive for myalgias.       Just generalized weakness leading to at least 3 falls since discharge.   Neurological: Positive for weakness.       Multiple falls at home  Endo/Heme/Allergies: Bruises/bleeds easily (Remains on Plavix).  Psychiatric/Behavioral: Positive for depression. The patient is nervous/anxious.     Family History  Problem Relation Age of Onset  . Breast cancer Neg Hx     Past Medical History:  Diagnosis Date  . Anxiety   . Arthritis    Psoriatic  . Bipolar disorder ([place])   . CKD (chronic kidney disease), stage III ([place])   . Depression   . DM type 2 (diabetes mellitus, type 2) ([place]) 11/14/2019  . Neuropathy   . NSTEMI (non-ST elevated myocardial infarction) ([place]) 11/14/2019  . OSA (obstructive sleep apnea)   . Psoriatic arthritis ([place])   . Sleep apnea     Past Surgical History:  Procedure Laterality Date  . FLEXIBLE SIGMOIDOSCOPY N/A 05/17/2020   Procedure: FLEXIBLE SIGMOIDOSCOPY;  Surgeon: Carol Ada, MD;  Location: [facility];  Service: Endoscopy;  Laterality: N/A;  . LEFT HEART CATH AND CORONARY ANGIOGRAPHY N/A 11/14/2019   Procedure: LEFT HEART CATH AND CORONARY ANGIOGRAPHY;  Surgeon: Martinique, Peter M, MD;  Location: [facility];  Service: Cardiovascular;  Laterality: N/A;    Social History:  reports that she has been smoking. She has never used smokeless tobacco. No history on file for alcohol use and drug use.  Allergies:  Allergies  Allergen Reactions  . Carbamazepine Other (See Comments)    Parkinsons like symptoms tremors  . Sertraline Hcl Other (See Comments)    Unknown reaction    Prior to Admission medications   Medication Sig Start Date End Date Taking? Authorizing Provider  ALPRAZolam Duanne Moron) 1 MG tablet Take 1 mg by mouth 3 (three) times daily. 11/21/19   [provider]  Ascorbic Acid (VITAMIN C) 1000 MG tablet Take 1,000 mg by mouth at bedtime.    [provider]  aspirin EC 81 MG tablet Take 1 tablet (81 mg total) by mouth daily. 11/14/19 11/13/20  Samuella Cota, MD  b complex vitamins tablet Take 2  tablets by mouth at bedtime.    [provider]  Cholecalciferol (VITAMIN D-3) 1000 units CAPS Take 1,000 Units by mouth 2 (two) times daily.     [provider]  clopidogrel (PLAVIX) 75 MG tablet Take 75 mg by mouth daily.    [provider]  docusate sodium (COLACE) 100 MG capsule Take 100-200 mg by mouth at bedtime.    [provider]  donepezil (ARICEPT) 10 MG tablet Take 10 mg by mouth daily. 10/30/19   [provider]  escitalopram (LEXAPRO) 20 MG tablet Take 30 mg by mouth daily.  09/10/15   [provider]  HYDROcodone-acetaminophen (NORCO) 10-325 MG tablet Take 1 tablet by mouth 2 (two) times daily as needed (pain).  07/31/19   [provider]  lamoTRIgine (LAMICTAL) 150 MG tablet Take 75-150 mg by mouth See admin instructions. Take 1/2 tablet (75 mg) by mouth every morning and 1 tablet (150 mg) at night 02/27/18   [provider]  levocetirizine (XYZAL) 5 MG tablet Take 5 mg by mouth daily. 04/14/20   [provider]  nadolol (CORGARD) 20 MG tablet Take 0.5 tablets (10 mg total) by mouth daily. 12/04/19  [name], MD  nitroGLYCERIN (NITROSTAT) 0.4 MG SL tablet Place 1 tablet (0.4 mg total) under the tongue every 5 (five) minutes as needed for chest pain. 11/14/19   Samuella Cota, MD  omeprazole (PRILOSEC) 20 MG capsule Take 20 mg by mouth daily.     [provider]  ondansetron (ZOFRAN) 4 MG tablet Take 1 tablet (4 mg total) by mouth daily as needed for nausea or vomiting. 05/19/20 05/19/21  Georgette Shell, MD  rizatriptan (MAXALT-MLT) 10 MG disintegrating tablet Take 10 mg by mouth See admin instructions. Take one tablet (10 mg) by mouth daily as needed for "floaters", May repeat in 2 hours if still needed    [provider]  rosuvastatin (CRESTOR) 5 MG tablet Take 1 tablet (5 mg total) by mouth daily. 03/21/20 06/19/20  [name], MD  traZODone (DESYREL) 100 MG tablet Take  200 mg by mouth at bedtime. 02/19/20   [provider]     Blood pressure 135/90, pulse 65, temperature 98.4 F (36.9 C), temperature source Oral, resp. rate 17, height 5\' 2"  (1.575 m), weight 90.3 kg, SpO2 95 %. Physical Exam:  General: pleasant, chronically ill appearing, older than her stated age white female who is laying in bed in NAD HEENT: head is normocephalic, atraumatic.  Sclera are noninjected.  Pupils are equal.  Ears and nose without any masses or lesions.  Mouth is pink and moist Heart: regular, rate, and rhythm.  Normal s1,s2. No obvious murmurs, gallops, or rubs noted.  Palpable radial and pedal pulses bilaterally Lungs: CTAB, no wheezes, rhonchi, or rales noted.  Respiratory effort nonlabored Abd: soft, tender in the left upper quadrant on palpation.  When you ask her where the pain is she points to the whole generalized abdomen.,  She was not distended , +BS, no masses, hernias, or organomegaly.  She does describe her BMs as being like "little worms." MS: all 4 extremities are symmetrical with no cyanosis, clubbing, or edema. Skin: warm and dry with no masses, lesions, or rashes Neuro: Cranial nerves 2-12 grossly intact, sensation is normal throughout Psych: A&Ox3 with an appropriate affect.   Results for orders placed or performed during the hospital encounter of 06/03/20 (from the past 48 hour(s))  Basic metabolic panel     Status: Abnormal   Collection Time: 06/03/20  1:39 PM  Result Value Ref Range   Sodium 143 135 - 145 mmol/L   Potassium 3.7 3.5 - 5.1 mmol/L   Chloride 102 98 - 111 mmol/L   CO2 30 22 - 32 mmol/L   Glucose, Bld 95 70 - 99 mg/dL    Comment: Glucose reference range applies only to samples taken after fasting for at least 8 hours.   BUN 12 6 - 20 mg/dL   Creatinine, Ser 1.80 (H) 0.44 - 1.00 mg/dL   Calcium 11.5 (H) 8.9 - 10.3 mg/dL   GFR, Estimated 30 (L) >60 mL/min   Anion gap 11 5 - 15    Comment: Performed at [facility] [address], [place], [place] [address]  CBC     Status: Abnormal   Collection Time: 06/03/20  1:39 PM  Result Value Ref Range   WBC 13.1 (H) 4.0 - 10.5 K/uL   RBC 3.46 (L) 3.87 - 5.11 MIL/uL   Hemoglobin 11.3 (L) 12.0 - 15.0 g/dL   HCT 38.6 36 - 46 %   MCV 111.6 (H) 80.0 - 100.0 fL   MCH 32.7  26.0 - 34.0 pg   MCHC 29.3 (L) 30.0 - 36.0 g/dL   RDW 15.0 11.5 - 15.5 %   Platelets 175 150 - 400 K/uL   nRBC 0.0 0.0 - 0.2 %    Comment: Performed at [facility], [place] [address], [place], [place] [address]  Urinalysis, Routine w reflex microscopic     Status: Abnormal   Collection Time: 06/03/20  2:05 PM  Result Value Ref Range   Color, Urine YELLOW YELLOW   APPearance CLOUDY (A) CLEAR   Specific Gravity, Urine 1.004 (L) 1.005 - 1.030   pH 6.0 5.0 - 8.0   Glucose, UA NEGATIVE NEGATIVE mg/dL   Hgb urine dipstick MODERATE (A) NEGATIVE   Bilirubin Urine NEGATIVE NEGATIVE   Ketones, ur NEGATIVE NEGATIVE mg/dL   Protein, ur NEGATIVE NEGATIVE mg/dL   Nitrite POSITIVE (A) NEGATIVE   Leukocytes,Ua LARGE (A) NEGATIVE   RBC / HPF 11-20 0 - 5 RBC/hpf   WBC, UA >50 (H) 0 - 5 WBC/hpf   Bacteria, UA MANY (A) NONE SEEN   Squamous Epithelial / LPF 0-5 0 - 5   WBC Clumps PRESENT    Mucus PRESENT    Non Squamous Epithelial 0-5 (A) NONE SEEN    Comment: Performed at [facility], [place] [address] [place], [place] [address]  Lactic acid, plasma     Status: Abnormal   Collection Time: 06/04/20  5:06 AM  Result Value Ref Range   Lactic Acid, Venous 0.4 (L) 0.5 - 1.9 mmol/L    Comment: Performed at [facility] [address], [place], [place] [address]  Respiratory Panel by RT PCR (Flu A&B, Covid) - Nasopharyngeal Swab     Status: None   Collection Time: 06/04/20  5:06 AM   Specimen: Nasopharyngeal Swab  Result Value Ref Range   SARS Coronavirus 2 by RT PCR NEGATIVE NEGATIVE    Comment: (NOTE) SARS-CoV-2 target nucleic acids are NOT DETECTED.  The SARS-CoV-2 RNA is generally  detectable in upper respiratoy specimens during the acute phase of infection. The lowest concentration of SARS-CoV-2 viral copies this assay can detect is 131 copies/mL. A negative result does not preclude SARS-Cov-2 infection and should not be used as the sole basis for treatment or other patient management decisions. A negative result may occur with  improper specimen collection/handling, submission of specimen other than nasopharyngeal swab, presence of viral mutation(s) within the areas targeted by this assay, and inadequate number of viral copies (<131 copies/mL). A negative result must be combined with clinical observations, patient history, and epidemiological information. The expected result is Negative.  Fact Sheet for Patients:  PinkCheek.be  Fact Sheet for Healthcare Providers:  GravelBags.it  This test is no t yet approved or cleared by the [place] FDA and  has been authorized for detection and/or diagnosis of SARS-CoV-2 by FDA under an Emergency Use Authorization (EUA). This EUA will remain  in effect (meaning this test can be used) for the duration of the COVID-19 declaration under Section 564(b)(1) of the Act, 21 U.S.C. section 360bbb-3(b)(1), unless the authorization is terminated or revoked sooner.     Influenza A by PCR NEGATIVE NEGATIVE   Influenza B by PCR NEGATIVE NEGATIVE    Comment: (NOTE) The Xpert Xpress SARS-CoV-2/FLU/RSV assay is intended as an aid in  the diagnosis of influenza from Nasopharyngeal swab specimens and  should not be used as a sole basis for treatment. Nasal washings and  aspirates are unacceptable for  Xpert Xpress SARS-CoV-2/FLU/RSV  testing.  Fact Sheet for Patients: PinkCheek.be  Fact Sheet for Healthcare Providers: GravelBags.it  This test is not yet approved or cleared by the [place] FDA and  has been  authorized for detection and/or diagnosis of SARS-CoV-2 by  FDA under an Emergency Use Authorization (EUA). This EUA will remain  in effect (meaning this test can be used) for the duration of the  Covid-19 declaration under Section 564(b)(1) of the Act, 21  U.S.C. section 360bbb-3(b)(1), unless the authorization is  terminated or revoked. Performed at [facility], [place] [address], [place], [place] [address]   Lactic acid, plasma     Status: None   Collection Time: 06/04/20  7:28 AM  Result Value Ref Range   Lactic Acid, Venous 0.5 0.5 - 1.9 mmol/L    Comment: Performed at [facility] [address], [place], [place] [address]   CT ABDOMEN PELVIS WO CONTRAST  Result Date: 06/04/2020 CLINICAL DATA:  Abdominal pain. EXAM: CT ABDOMEN AND PELVIS WITHOUT CONTRAST TECHNIQUE: Multidetector CT imaging of the abdomen and pelvis was performed following the standard protocol without IV contrast. COMPARISON:  05/14/2020 FINDINGS: Lower chest: Atelectasis or scarring noted right middle lobe. Dependent atelectasis noted lung bases bilaterally. Hepatobiliary: No focal abnormality in the liver on this study without intravenous contrast. Gallbladder is surgically absent. No intrahepatic or extrahepatic biliary dilation. Pancreas: No focal mass lesion. No dilatation of the main duct. No intraparenchymal cyst. No peripancreatic edema. Spleen: Insert spleen Adrenals/Urinary Tract: No adrenal nodule or mass. No gross renal mass lesion evident on this noncontrast CT scan. No evidence for urinary stone disease. Mild fullness of the right intrarenal collecting system is new in the interval. No gross hydroureter. Wall thickening noted along the bladder dome is associated with a tiny gas bubble in the bladder lumen. Stomach/Bowel: Small hiatal hernia. Stomach is unremarkable. No gastric wall thickening. No evidence of outlet obstruction. Duodenum is normally positioned as is the ligament of Treitz. No small  bowel wall thickening. No small bowel dilatation. The terminal ileum is normal. The appendix is not visualized, but there is no edema or inflammation in the region of the cecum. There is left colonic diverticulosis with subtle pericolonic edema/inflammation associated with the proximal sigmoid segment. 2 soft tissue tracks communicate between the proximal to mid sigmoid colon in the dome of bladder 1 of which goes from the anterior wall of the sigmoid colon to the anterior bladder dome and is best demonstrated on sagittal images 80-89. A second confluent area of soft tissue extends from the inferior wall of the sigmoid colon to the mid bladder no (sagittal 78/7) with gas and debris between the colon in the dome of bladder. Either one or both areas could represent colovesical fistula. The collection of debris and gas wedged between the inferior wall the sigmoid colon and mid bladder dome suggests para colonic abscess and this may be intramural in the bladder wall. Vascular/Lymphatic: No abdominal aortic aneurysm. There is no gastrohepatic or hepatoduodenal ligament lymphadenopathy. No retroperitoneal or mesenteric lymphadenopathy. No pelvic sidewall lymphadenopathy. Reproductive: Inferior pelvis obscured by beam hardening artifact from bilateral hip replacement, but no adnexal mass evident. Other: No intraperitoneal free fluid. Musculoskeletal: Status post bilateral hip replacement. No worrisome lytic or sclerotic osseous abnormality. T9 compression deformity again noted. IMPRESSION: 1. Left colonic diverticulosis with subtle pericolonic edema/inflammation associated with the proximal sigmoid segment. Imaging features are compatible with diverticulitis. 2. 2 different tracks of soft tissue between  the proximal to mid sigmoid colon and the bladder dome. Either one or both areas could represent colovesical fistula. The collection of debris and gas wedged between the inferior wall the sigmoid colon and mid bladder dome  suggests para colonic abscess and this may be intramural in the bladder wall. Overall imaging features are similar to 05/14/2020. 3. Mild fullness of the right intrarenal collecting system without gross hydroureter. This finding is new since 05/14/2020. 4. Small hiatal hernia. 5. Status post bilateral hip replacement. Electronically Signed   By: Misty Stanley M.D.   On: 06/04/2020 07:24   DG Tibia/Fibula Right  Result Date: 06/04/2020 CLINICAL DATA:  Pain after a fall. EXAM: RIGHT TIBIA AND FIBULA - 2 VIEW COMPARISON:  None. FINDINGS: There is no evidence of fracture or other focal bone lesions. Soft tissues are unremarkable. IMPRESSION: Negative. Electronically Signed   By: Misty Stanley M.D.   On: 06/04/2020 05:17   CT Head Wo Contrast  Result Date: 06/03/2020 CLINICAL DATA:  Head trauma, coagulopathy.  Weakness. EXAM: CT HEAD WITHOUT CONTRAST TECHNIQUE: Contiguous axial images were obtained from the base of the skull through the vertex without intravenous contrast. COMPARISON:  CT head 02/20/2020 FINDINGS: Brain: No evidence of acute large vascular territory infarction, hemorrhage, hydrocephalus, extra-axial collection or mass lesion/mass effect. Redemonstrated partially empty sella. Vascular: No hyperdense vessel identified. Skull: No acute fracture. Sinuses/Orbits: Partially imaged right maxillary sinus retention cyst. Otherwise, the sinuses are clear. No acute orbital abnormality. Other: No mastoid effusions IMPRESSION: 1. No evidence of acute intracranial abnormality. 2. Redemonstrated partially empty sella, which may be an incidental finding but can be seen with idiopathic intracranial hypertension in the correct clinical setting. Electronically Signed   By: Margaretha Sheffield MD   On: 06/03/2020 14:57   DG Knee Complete 4 Views Right  Result Date: 06/04/2020 CLINICAL DATA:  Pain after a fall. EXAM: RIGHT KNEE - COMPLETE 4+ VIEW COMPARISON:  None. FINDINGS: No fracture. No subluxation or  dislocation. Marked loss of joint space noted medial compartment. Hypertrophic spurring evident in all 3 compartments with probable small joint effusion in the suprapatellar bursa. IMPRESSION: Tricompartmental degenerative changes without acute bony abnormality. Electronically Signed   By: Misty Stanley M.D.   On: 06/04/2020 05:16      Assessment/Plan Psoriatic arthritis -immunosuppressed; prednisone, methotrexate, Plaquenil and Enbrel Hx CAD/MI -10/2019 Hx CVA -on Plavix Stage III kidney disease Multiple falls/severe deconditioning Moderate malnutrition -prealbumin 12.4 (05/16/2020) Failure to thrive Ongoing tobacco use -42 years Hx of anxiety and depression Probable UTI  Colovesicular fistula with sigmoid diverticulitis   Plan: Recommend Medicine admission and  place her back on antibiotics. The initial plan to wean her off immunosuppressants, and work on her nutrition in order to do a single procedure operation; would still be the primary recommendation.  If this fails, the most likely scenario would be a two-step operation with partial colectomy and fistula repair with colostomy.  Followed at a later date with colostomy reversal.  We will follow with you.  She has been off her immunosuppressive since discharge except for prednisone which is at half dose.   Earnstine Regal [facility] 06/04/2020, 7:52 AM Please see Amion for pager number during day hours 7:00am-4:30pm

## 2020-06-04 NOTE — Hospital Course (Addendum)
Valerie [name] is a 59 yo CF with PMH DMII, CKDIII, peripheral neuropathy, Psoriatic arthritis (chronic prednisone use), OSA, CAD and now recent diagnosis of colovesical fistula who was recently hospitalized 9/21 - 9/26 for abdominal pain and colitis related to underlying fistula.  She is presenting today with worsening weakness, frequent falling, lethargy, worsening nausea, intermittent confusion, and has significantly lost her appetite and is not eating much per her sister who is bedside in the ER.  She was evaluated by surgery and urology last hospitalization with tentative plans for possible fistula repair in 4 to 6 weeks after discontinuing her immunosuppressants. Her prednisone was decreased to 5 mg daily at discharge and her sister states that she then quickly and progressively became weaker and started falling almost daily.  She is now requiring a walker to get around and previously only needed a cane.  She uses a wheelchair if she is going to outpatient appointments.  Also, due to her poor appetite and weakness she underwent placement of a PICC on 10/7 with plans to start home TPN until her nutritional status improved. Her sister says the TPN was to be delivered to their house on 10/12.   In the ER she underwent work-up for her weakness. CT abdomen/pelvis revealed left colonic diverticulosis with pericolonic edema/inflammation concerning for diverticulitis.  Colovesical fistula again appreciated with possible underlying paracolonic abscess which is felt to be similar when compared to previous CT on 05/14/2020. There was also mild fullness of the right intrarenal collecting system but no obvious hydroureter, which is a new finding compared to previous CT. She was afebrile.  Blood pressure was mildly hypotensive however she was also previously hypotensive last hospitalization.  Slightly bradycardic, 50s to 60s however is on nadolol. Chemistries mostly unremarkable, renal function at baseline in  setting of CKD stage III. WBC mildly elevated, 13.1. Hemoglobin 11.3, hematocrit 38.6, MCV 111, platelets 175 Urinalysis: Large leukocytes, positive nitrites, greater than 50 WBC, many bacteria Lactic acid 0.4 Covid and flu swab negative  She was started on Zosyn.  Right lower extremity x-ray in setting of falls at home and mostly reveals degenerative changes in her right knee.  No obvious fractures.   Surgery was also consulted while patient was in the ER.  She is admitted for further evaluation of her weakness.

## 2020-06-04 NOTE — Assessment & Plan Note (Addendum)
-   SSI, q4h while on TPN and CBG monitoring - check A1c

## 2020-06-04 NOTE — Assessment & Plan Note (Signed)
-   follow up med rec - resume lamictal

## 2020-06-04 NOTE — Assessment & Plan Note (Signed)
-   Hgb 11 which is about baseline. MCV 111 - check folate and B12; start supplements as needed - patient being started on TPN as well

## 2020-06-04 NOTE — H&P (Addendum)
History and Physical    Valerie [name]  TKZ:[id number]  DOB: 05-19-1961  DOA: 06/03/2020  PCP: Valerie Santee I, MD Patient coming from: home  Chief Complaint: weakness  HPI:  Valerie [name] is a 59 yo CF with PMH DMII, CKDIII, peripheral neuropathy, Psoriatic arthritis (chronic prednisone use), OSA, CAD and now recent diagnosis of colovesical fistula who was recently hospitalized 9/21 - 9/26 for abdominal pain and colitis related to underlying fistula.  She is presenting today with worsening weakness, frequent falling, lethargy, worsening nausea, intermittent confusion, and has significantly lost her appetite and is not eating much per her sister who is bedside in the ER.  She was evaluated by surgery and urology last hospitalization with tentative plans for possible fistula repair in 4 to 6 weeks after discontinuing her immunosuppressants. Her prednisone was decreased to 5 mg daily at discharge and her sister states that she then quickly and progressively became weaker and started falling almost daily.  She is now requiring a walker to get around and previously only needed a cane.  She uses a wheelchair if she is going to outpatient appointments.  Also, due to her poor appetite and weakness she underwent placement of a PICC on 10/7 with plans to start home TPN until her nutritional status improved. Her sister says the TPN was to be delivered to their house on 10/12.   In the ER she underwent work-up for her weakness. CT abdomen/pelvis revealed left colonic diverticulosis with pericolonic edema/inflammation concerning for diverticulitis.  Colovesical fistula again appreciated with possible underlying paracolonic abscess which is felt to be similar when compared to previous CT on 05/14/2020. There was also mild fullness of the right intrarenal collecting system but no obvious hydroureter, which is a new finding compared to previous CT. She was afebrile.  Blood pressure was mildly hypotensive  however she was also previously hypotensive last hospitalization.  Slightly bradycardic, 50s to 60s however is on nadolol. Chemistries mostly unremarkable, renal function at baseline in setting of CKD stage III. WBC mildly elevated, 13.1. Hemoglobin 11.3, hematocrit 38.6, MCV 111, platelets 175 Urinalysis: Large leukocytes, positive nitrites, greater than 50 WBC, many bacteria Lactic acid 0.4 Covid and flu swab negative  She was started on Zosyn.  Right lower extremity x-ray in setting of falls at home and mostly reveals degenerative changes in her right knee.  No obvious fractures.   Surgery was also consulted while patient was in the ER.  She is admitted for further evaluation of her weakness.     [name] have personally briefly reviewed patient[facility] and discussed patient with the ER provider when appropriate/indicated.  Assessment/Plan: Colovesical fistula - well known to surgery and urology; tentative plan is for repair if/when patient able to tolerate surgery; currently per sister she has been getting weaker and falling more often since discharge on 9/26 (see weakness) - UA appearance is concerning for infection vs colonization from fistula; no obvious urinary symptoms, afebrile; mild leukocytosis (13.1); but weakness may be due to infection vs steroid withdrawal  - CT abd/pelvis shows subtle diverticulitis, known fistula with paracolonic abscess, and enlarged right renal system but no gross hydroureter - obtain blood cultures - continue zosyn for now; trend PCT - surgery aware, follow up consult  - may need to place drain into abscess via IR? - start on bowel regimen given previous constipation seen on CT - will have pharmacy start TPN while patient here given poor nutritional status; Right PICC in place  with plans to start TPN at home; monitor response to steroid increase as well  Weakness - differential includes infection from CV fistula vs  glucocorticoid withdrawal; she does not appear toxic/infected clinically; [name] believe her urine is colonized at this point and she has been on IV abx last hospitalization as well as discharged with 10 days Augmentin upon 9/26 discharge; her prednisone was chronically 10 mg daily for years she says and was taken down to 5 mg daily followed almost immediately by worsening weakness/lethargy, falling, confusion, loss of appetite, and nausea unrelieved by zofran (this appears consistent at least in part due to some HPA suppression and too aggressive of steroid decrease) - check cortisol; hold off on stim test - will place back on prednisone 10 mg (patient last took 5 mg on Sunday; nothing on Monday due to severe nausea and weakness); if unable to tolerate PO then may need to try IV; would decrease prednisone at discharge by 1 mg every 1-2 weeks  - check TSH - follow up cultures, PCT and continue zosyn but low threshold to discontinue if remains nontoxic appearing; however remains at risk for recurrent infections until fistula is repaired.   Psoriatic arthritis (Valerie [name]) - patient has been managed with MTX, prednisone 10 daily (for years per patient), Plaquenil, and Enbrel; all held on discharge on 9/26 in anticipation of surgery possibly within 4-6 weeks, however prednisone was decreased to 5 mg daily at discharge  - continue holding immunosuppressants except prednisone (see weakness)  Macrocytic anemia - Hgb 11 which is about baseline. MCV 111 - check folate and B12; start supplements as needed - patient being started on TPN as well   CAD (coronary artery disease) - follows outpatient with cardiology; s/p previous NSTEMI - continue asa, BB, statin   DM type 2 (diabetes mellitus, type 2) (HCC) - SSI, q4h while on TPN and CBG monitoring - check A1c  Bipolar disorder (Valerie [name]) - follow up med rec - resume lamictal  OSA (obstructive sleep apnea) - RT eval for nightly CPAP if she will wear it   Anxiety -  database reviewed; last fill of alprazolam was 11/21/19; hold off on further benzos at this time, especially in setting of weakness/falls - check UDS    Code Status: Full DVT Prophylaxis: Lovenox Anticipated disposition is: pending PT eval   History: Past Medical History:  Diagnosis Date  . Anxiety   . Arthritis    Psoriatic  . Bipolar disorder ([place])   . CKD (chronic kidney disease), stage III ([place])   . Depression   . DM type 2 (diabetes mellitus, type 2) ([place]) 11/14/2019  . Neuropathy   . NSTEMI (non-ST elevated myocardial infarction) ([place]) 11/14/2019  . OSA (obstructive sleep apnea)   . Psoriatic arthritis ([place])   . Sleep apnea     Past Surgical History:  Procedure Laterality Date  . FLEXIBLE SIGMOIDOSCOPY N/A 05/17/2020   Procedure: FLEXIBLE SIGMOIDOSCOPY;  Surgeon: Carol Ada, MD;  Location: [facility];  Service: Endoscopy;  Laterality: N/A;  . LEFT HEART CATH AND CORONARY ANGIOGRAPHY N/A 11/14/2019   Procedure: LEFT HEART CATH AND CORONARY ANGIOGRAPHY;  Surgeon: Martinique, Peter M, MD;  Location: [facility];  Service: Cardiovascular;  Laterality: N/A;     reports that she has been smoking. She has never used smokeless tobacco. No history on file for alcohol use and drug use.  Allergies  Allergen Reactions  . Carbamazepine Other (See Comments)    Parkinsons like symptoms tremors  . Sertraline  Hcl Other (See Comments)    Unknown reaction    Family History  Problem Relation Age of Onset  . Breast cancer Neg Hx    Home Medications: Prior to Admission medications   Medication Sig Start Date End Date Taking? Authorizing Provider  predniSONE (DELTASONE) 10 MG tablet Take 5 mg by mouth daily with breakfast.   Yes [provider]  ALPRAZolam (XANAX) 1 MG tablet Take 1 mg by mouth 3 (three) times daily. 11/21/19   [provider]  Ascorbic Acid (VITAMIN C) 1000 MG tablet Take 1,000 mg by mouth at bedtime.    [provider]  aspirin EC  81 MG tablet Take 1 tablet (81 mg total) by mouth daily. 11/14/19 11/13/20  Samuella Cota, MD  b complex vitamins tablet Take 2 tablets by mouth at bedtime.    [provider]  Cholecalciferol (VITAMIN D-3) 1000 units CAPS Take 1,000 Units by mouth 2 (two) times daily.     [provider]  clopidogrel (PLAVIX) 75 MG tablet Take 75 mg by mouth daily.    [provider]  docusate sodium (COLACE) 100 MG capsule Take 100-200 mg by mouth at bedtime.    [provider]  donepezil (ARICEPT) 10 MG tablet Take 10 mg by mouth daily. 10/30/19   [provider]  escitalopram (LEXAPRO) 20 MG tablet Take 30 mg by mouth daily.  09/10/15   [provider]  HYDROcodone-acetaminophen (NORCO) 10-325 MG tablet Take 1 tablet by mouth 2 (two) times daily as needed (pain).  07/31/19   [provider]  lamoTRIgine (LAMICTAL) 150 MG tablet Take 75-150 mg by mouth See admin instructions. Take 1/2 tablet (75 mg) by mouth every morning and 1 tablet (150 mg) at night 02/27/18   [provider]  levocetirizine (XYZAL) 5 MG tablet Take 5 mg by mouth daily. 04/14/20   [provider]  nadolol (CORGARD) 20 MG tablet Take 0.5 tablets (10 mg total) by mouth daily. 12/04/19   [name], MD  nitroGLYCERIN (NITROSTAT) 0.4 MG SL tablet Place 1 tablet (0.4 mg total) under the tongue every 5 (five) minutes as needed for chest pain. 11/14/19   Samuella Cota, MD  omeprazole (PRILOSEC) 20 MG capsule Take 20 mg by mouth daily.     [provider]  ondansetron (ZOFRAN) 8 MG tablet Take 8 mg by mouth 2 (two) times daily as needed. 05/02/20   [provider]  rizatriptan (MAXALT-MLT) 10 MG disintegrating tablet Take 10 mg by mouth See admin instructions. Take one tablet (10 mg) by mouth daily as needed for "floaters", May repeat in 2 hours if still needed    [provider]  rosuvastatin (CRESTOR) 5 MG tablet Take 1 tablet (5 mg total)  by mouth daily. 03/21/20 06/19/20  [name], MD  traZODone (DESYREL) 100 MG tablet Take 200 mg by mouth at bedtime. 02/19/20   [provider]    Review of Systems:  Pertinent items noted in HPI and remainder of comprehensive ROS otherwise negative.  Physical Exam: Vitals:   06/04/20 0830 06/04/20 0845 06/04/20 0915 06/04/20 0945  BP: 101/62 99/70 (!) 109/49 90/72  Pulse: 65 66 67 69  Resp: 15 16 19  (!) 24  Temp:      TempSrc:      SpO2: 93% 90% 94% 94%  Weight:      Height:       General appearance: pleasant but fatigued appearing adult woman laying in  bed in no distress and appears comfortable Head: Normocephalic, without obvious abnormality, atraumatic Eyes: EOMI Lungs: clear to auscultation bilaterally Heart: bradycardic, regular rhythm Abdomen: minimal nonspecific TTP; BS hypoactive; no R/G Extremities: trace LE edema; RUE noted with PICC in place and covered with dressing Skin: mobility and turgor normal Neurologic: Grossly normal  Labs on Admission:  [name] have personally reviewed following labs and imaging studies Results for orders placed or performed during the hospital encounter of 06/03/20 (from the past 24 hour(s))  Basic metabolic panel     Status: Abnormal   Collection Time: 06/03/20  1:39 PM  Result Value Ref Range   Sodium 143 135 - 145 mmol/L   Potassium 3.7 3.5 - 5.1 mmol/L   Chloride 102 98 - 111 mmol/L   CO2 30 22 - 32 mmol/L   Glucose, Bld 95 70 - 99 mg/dL   BUN 12 6 - 20 mg/dL   Creatinine, Ser 1.80 (H) 0.44 - 1.00 mg/dL   Calcium 11.5 (H) 8.9 - 10.3 mg/dL   GFR, Estimated 30 (L) >60 mL/min   Anion gap 11 5 - 15  CBC     Status: Abnormal   Collection Time: 06/03/20  1:39 PM  Result Value Ref Range   WBC 13.1 (H) 4.0 - 10.5 K/uL   RBC 3.46 (L) 3.87 - 5.11 MIL/uL   Hemoglobin 11.3 (L) 12.0 - 15.0 g/dL   HCT 38.6 36 - 46 %   MCV 111.6 (H) 80.0 - 100.0 fL   MCH 32.7 26.0 - 34.0 pg   MCHC 29.3 (L) 30.0 - 36.0 g/dL   RDW 15.0 11.5 -  15.5 %   Platelets 175 150 - 400 K/uL   nRBC 0.0 0.0 - 0.2 %  Urinalysis, Routine w reflex microscopic     Status: Abnormal   Collection Time: 06/03/20  2:05 PM  Result Value Ref Range   Color, Urine YELLOW YELLOW   APPearance CLOUDY (A) CLEAR   Specific Gravity, Urine 1.004 (L) 1.005 - 1.030   pH 6.0 5.0 - 8.0   Glucose, UA NEGATIVE NEGATIVE mg/dL   Hgb urine dipstick MODERATE (A) NEGATIVE   Bilirubin Urine NEGATIVE NEGATIVE   Ketones, ur NEGATIVE NEGATIVE mg/dL   Protein, ur NEGATIVE NEGATIVE mg/dL   Nitrite POSITIVE (A) NEGATIVE   Leukocytes,Ua LARGE (A) NEGATIVE   RBC / HPF 11-20 0 - 5 RBC/hpf   WBC, UA >50 (H) 0 - 5 WBC/hpf   Bacteria, UA MANY (A) NONE SEEN   Squamous Epithelial / LPF 0-5 0 - 5   WBC Clumps PRESENT    Mucus PRESENT    Non Squamous Epithelial 0-5 (A) NONE SEEN  Lactic acid, plasma     Status: Abnormal   Collection Time: 06/04/20  5:06 AM  Result Value Ref Range   Lactic Acid, Venous 0.4 (L) 0.5 - 1.9 mmol/L  Respiratory Panel by RT PCR (Flu A&B, Covid) - Nasopharyngeal Swab     Status: None   Collection Time: 06/04/20  5:06 AM   Specimen: Nasopharyngeal Swab  Result Value Ref Range   SARS Coronavirus 2 by RT PCR NEGATIVE NEGATIVE   Influenza A by PCR NEGATIVE NEGATIVE   Influenza B by PCR NEGATIVE NEGATIVE  Lactic acid, plasma     Status: None   Collection Time: 06/04/20  7:28 AM  Result Value Ref Range   Lactic Acid, Venous 0.5 0.5 - 1.9 mmol/L     Radiological Exams on Admission: CT ABDOMEN PELVIS WO CONTRAST  Result Date: 06/04/2020 CLINICAL DATA:  Abdominal pain. EXAM: CT ABDOMEN AND PELVIS WITHOUT CONTRAST TECHNIQUE: Multidetector CT imaging of the abdomen and pelvis was performed following the standard protocol without IV contrast. COMPARISON:  05/14/2020 FINDINGS: Lower chest: Atelectasis or scarring noted right middle lobe. Dependent atelectasis noted lung bases bilaterally. Hepatobiliary: No focal abnormality in the liver on this study  without intravenous contrast. Gallbladder is surgically absent. No intrahepatic or extrahepatic biliary dilation. Pancreas: No focal mass lesion. No dilatation of the main duct. No intraparenchymal cyst. No peripancreatic edema. Spleen: Insert spleen Adrenals/Urinary Tract: No adrenal nodule or mass. No gross renal mass lesion evident on this noncontrast CT scan. No evidence for urinary stone disease. Mild fullness of the right intrarenal collecting system is new in the interval. No gross hydroureter. Wall thickening noted along the bladder dome is associated with a tiny gas bubble in the bladder lumen. Stomach/Bowel: Small hiatal hernia. Stomach is unremarkable. No gastric wall thickening. No evidence of outlet obstruction. Duodenum is normally positioned as is the ligament of Treitz. No small bowel wall thickening. No small bowel dilatation. The terminal ileum is normal. The appendix is not visualized, but there is no edema or inflammation in the region of the cecum. There is left colonic diverticulosis with subtle pericolonic edema/inflammation associated with the proximal sigmoid segment. 2 soft tissue tracks communicate between the proximal to mid sigmoid colon in the dome of bladder 1 of which goes from the anterior wall of the sigmoid colon to the anterior bladder dome and is best demonstrated on sagittal images 80-89. A second confluent area of soft tissue extends from the inferior wall of the sigmoid colon to the mid bladder no (sagittal 78/7) with gas and debris between the colon in the dome of bladder. Either one or both areas could represent colovesical fistula. The collection of debris and gas wedged between the inferior wall the sigmoid colon and mid bladder dome suggests para colonic abscess and this may be intramural in the bladder wall. Vascular/Lymphatic: No abdominal aortic aneurysm. There is no gastrohepatic or hepatoduodenal ligament lymphadenopathy. No retroperitoneal or mesenteric  lymphadenopathy. No pelvic sidewall lymphadenopathy. Reproductive: Inferior pelvis obscured by beam hardening artifact from bilateral hip replacement, but no adnexal mass evident. Other: No intraperitoneal free fluid. Musculoskeletal: Status post bilateral hip replacement. No worrisome lytic or sclerotic osseous abnormality. T9 compression deformity again noted. IMPRESSION: 1. Left colonic diverticulosis with subtle pericolonic edema/inflammation associated with the proximal sigmoid segment. Imaging features are compatible with diverticulitis. 2. 2 different tracks of soft tissue between the proximal to mid sigmoid colon and the bladder dome. Either one or both areas could represent colovesical fistula. The collection of debris and gas wedged between the inferior wall the sigmoid colon and mid bladder dome suggests para colonic abscess and this may be intramural in the bladder wall. Overall imaging features are similar to 05/14/2020. 3. Mild fullness of the right intrarenal collecting system without gross hydroureter. This finding is new since 05/14/2020. 4. Small hiatal hernia. 5. Status post bilateral hip replacement. Electronically Signed   By: Misty Stanley M.D.   On: 06/04/2020 07:24   DG Tibia/Fibula Right  Result Date: 06/04/2020 CLINICAL DATA:  Pain after a fall. EXAM: RIGHT TIBIA AND FIBULA - 2 VIEW COMPARISON:  None. FINDINGS: There is no evidence of fracture or other focal bone lesions. Soft tissues are unremarkable. IMPRESSION: Negative. Electronically Signed   By: Misty Stanley M.D.   On: 06/04/2020 05:17   CT Head Wo Contrast  Result Date: 06/03/2020 CLINICAL DATA:  Head trauma, coagulopathy.  Weakness. EXAM: CT HEAD WITHOUT CONTRAST TECHNIQUE: Contiguous axial images were obtained from the base of the skull through the vertex without intravenous contrast. COMPARISON:  CT head 02/20/2020 FINDINGS: Brain: No evidence of acute large vascular territory infarction, hemorrhage, hydrocephalus,  extra-axial collection or mass lesion/mass effect. Redemonstrated partially empty sella. Vascular: No hyperdense vessel identified. Skull: No acute fracture. Sinuses/Orbits: Partially imaged right maxillary sinus retention cyst. Otherwise, the sinuses are clear. No acute orbital abnormality. Other: No mastoid effusions IMPRESSION: 1. No evidence of acute intracranial abnormality. 2. Redemonstrated partially empty sella, which may be an incidental finding but can be seen with idiopathic intracranial hypertension in the correct clinical setting. Electronically Signed   By: Margaretha Sheffield MD   On: 06/03/2020 14:57   DG Knee Complete 4 Views Right  Result Date: 06/04/2020 CLINICAL DATA:  Pain after a fall. EXAM: RIGHT KNEE - COMPLETE 4+ VIEW COMPARISON:  None. FINDINGS: No fracture. No subluxation or dislocation. Marked loss of joint space noted medial compartment. Hypertrophic spurring evident in all 3 compartments with probable small joint effusion in the suprapatellar bursa. IMPRESSION: Tricompartmental degenerative changes without acute bony abnormality. Electronically Signed   By: Misty Stanley M.D.   On: 06/04/2020 05:16   CT ABDOMEN PELVIS WO CONTRAST  Final Result    DG Knee Complete 4 Views Right  Final Result    DG Tibia/Fibula Right  Final Result    CT Head Wo Contrast  Final Result      Consults called:  Surgery   EKG: Independently reviewed. SR with 1st degree AV block   Dwyane Dee, MD [facility] 06/04/2020, 10:05 AM

## 2020-06-04 NOTE — Assessment & Plan Note (Signed)
-   RT eval for nightly CPAP if she will wear it

## 2020-06-04 NOTE — Assessment & Plan Note (Signed)
-   patient has been managed with MTX, prednisone 10 daily (for years per patient), Plaquenil, and Enbrel; all held on discharge on 9/26 in anticipation of surgery possibly within 4-6 weeks, however prednisone was decreased to 5 mg daily at discharge  - continue holding immunosuppressants except prednisone (see weakness)

## 2020-06-04 NOTE — ED Provider Notes (Addendum)
[facility] EMERGENCY DEPARTMENT Provider Note   CSN: [id number] Arrival date & time: 06/03/20  1321     History Chief Complaint  Patient presents with  . Weakness    Valerie [name] is a 59 y.o. female.  HPI     This is a 59 year old female with a history of chronic kidney disease, diabetes, colovesicular fistula who presents with altered mental status and generalized weakness.  Patient reports that she has had generalized weakness and multiple falls over the last several days.  She lives with a roommate.  Per the daughter, the roommate called earlier today because the patient was altered.  Patient's daughter went to evaluate and stated that she was somnolent and difficult to arouse.  She also since discharge 3 weeks ago has had decreased oral intake and persistent nausea.  No noted fevers, chest pain, shortness of breath.  However, she has had some abdominal discomfort and nausea.  The abdominal discomfort is lower.  No obvious dysuria.  Daughter reports that she saw Dr. Abner Greenspan on Friday and had a cystoscopy and a reassuring urine.  Additionally she is due to see Dr. Johney Maine today.  Chart reviewed.  She was admitted several weeks ago for colovesicular fistula and abdominal infection.  She was discharged on oral antibiotics of Augmentin with both urology and general surgery follow-up.  She has finished that course of antibiotics.  Past Medical History:  Diagnosis Date  . Anxiety   . Arthritis    Psoriatic  . Bipolar disorder ([place])   . CKD (chronic kidney disease), stage III ([place])   . Depression   . DM type 2 (diabetes mellitus, type 2) ([place]) 11/14/2019  . Neuropathy   . NSTEMI (non-ST elevated myocardial infarction) ([place]) 11/14/2019  . OSA (obstructive sleep apnea)   . Psoriatic arthritis ([place])   . Sleep apnea     Patient Active Problem List   Diagnosis Date Noted  . 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  Intra-abdominal infection   . Acute colitis 05/14/2020  . NSTEMI (non-ST elevated myocardial infarction) ([place]) 11/14/2019  . DM type 2 (diabetes mellitus, type 2) ([place]) 11/14/2019  . Depression   . Anxiety   . Chest pain   . Obesity, Class III, BMI 40-49.9 (morbid obesity) ([place])   . CKD (chronic kidney disease), stage III ([place])   . Psoriatic arthritis ([place])   . Tobacco abuse   . OSA (obstructive sleep apnea)   . Neuropathy   . Bipolar disorder [facility])     Past Surgical History:  Procedure Laterality Date  . FLEXIBLE SIGMOIDOSCOPY N/A 05/17/2020   Procedure: FLEXIBLE SIGMOIDOSCOPY;  Surgeon: Carol Ada, MD;  Location: [facility];  Service: Endoscopy;  Laterality: N/A;  . LEFT HEART CATH AND CORONARY ANGIOGRAPHY N/A 11/14/2019   Procedure: LEFT HEART CATH AND CORONARY ANGIOGRAPHY;  Surgeon: Martinique, Peter M, MD;  Location: [facility];  Service: Cardiovascular;  Laterality: N/A;     OB History   No obstetric history on file.     Family History  Problem Relation Age of Onset  . Breast cancer Neg Hx     Social History   Tobacco Use  . Smoking status: Current Every Day Smoker  . Smokeless tobacco: Never Used  Substance Use Topics  . Alcohol use: Not on file  . Drug use: Not on file    Home Medications Prior to Admission medications   Medication Sig Start Date End Date  Taking? Authorizing Provider  ALPRAZolam Duanne Moron) 1 MG tablet Take 1 mg by mouth 3 (three) times daily. 11/21/19   [provider]  Ascorbic Acid (VITAMIN C) 1000 MG tablet Take 1,000 mg by mouth at bedtime.    [provider]  aspirin EC 81 MG tablet Take 1 tablet (81 mg total) by mouth daily. 11/14/19 11/13/20  Samuella Cota, MD  b complex vitamins tablet Take 2 tablets by mouth at bedtime.    [provider]  Cholecalciferol (VITAMIN D-3) 1000 units CAPS Take 1,000 Units by mouth 2 (two) times daily.     [provider]  clopidogrel (PLAVIX) 75 MG tablet Take  75 mg by mouth daily.    [provider]  docusate sodium (COLACE) 100 MG capsule Take 100-200 mg by mouth at bedtime.    [provider]  donepezil (ARICEPT) 10 MG tablet Take 10 mg by mouth daily. 10/30/19   [provider]  escitalopram (LEXAPRO) 20 MG tablet Take 30 mg by mouth daily.  09/10/15   [provider]  HYDROcodone-acetaminophen (NORCO) 10-325 MG tablet Take 1 tablet by mouth 2 (two) times daily as needed (pain).  07/31/19   [provider]  lamoTRIgine (LAMICTAL) 150 MG tablet Take 75-150 mg by mouth See admin instructions. Take 1/2 tablet (75 mg) by mouth every morning and 1 tablet (150 mg) at night 02/27/18   [provider]  levocetirizine (XYZAL) 5 MG tablet Take 5 mg by mouth daily. 04/14/20   [provider]  nadolol (CORGARD) 20 MG tablet Take 0.5 tablets (10 mg total) by mouth daily. 12/04/19   [name], MD  nitroGLYCERIN (NITROSTAT) 0.4 MG SL tablet Place 1 tablet (0.4 mg total) under the tongue every 5 (five) minutes as needed for chest pain. 11/14/19   Samuella Cota, MD  omeprazole (PRILOSEC) 20 MG capsule Take 20 mg by mouth daily.     [provider]  ondansetron (ZOFRAN) 4 MG tablet Take 1 tablet (4 mg total) by mouth daily as needed for nausea or vomiting. 05/19/20 05/19/21  Georgette Shell, MD  rizatriptan (MAXALT-MLT) 10 MG disintegrating tablet Take 10 mg by mouth See admin instructions. Take one tablet (10 mg) by mouth daily as needed for "floaters", May repeat in 2 hours if still needed    [provider]  rosuvastatin (CRESTOR) 5 MG tablet Take 1 tablet (5 mg total) by mouth daily. 03/21/20 06/19/20  [name], MD  traZODone (DESYREL) 100 MG tablet Take 200 mg by mouth at bedtime. 02/19/20   [provider]    Allergies    Carbamazepine and Sertraline hcl  Review of Systems   Review of Systems  Constitutional: Negative for fever.  Respiratory: Negative  for cough and shortness of breath.   Cardiovascular: Negative for chest pain.  Gastrointestinal: Positive for abdominal pain and nausea. Negative for constipation, diarrhea and vomiting.  Genitourinary: Negative for dysuria.  Musculoskeletal: Negative for back pain.       Right knee pain  Neurological: Positive for light-headedness.  Psychiatric/Behavioral: Positive for confusion.  All other systems reviewed and are negative.   Physical Exam Updated Vital Signs BP 135/90   Pulse 65   Temp 98.4 F (36.9 C) (Oral)   Resp 17   Ht 1.575 m (5\' 2" )   Wt 90.3 kg   SpO2 95%   BMI 36.40 kg/m   Physical Exam Vitals and nursing note reviewed.  Constitutional:  Appearance: She is well-developed. She is obese.     Comments: Chronically ill-appearing, nontoxic  HENT:     Head: Normocephalic and atraumatic.     Mouth/Throat:     Mouth: Mucous membranes are dry.  Eyes:     Pupils: Pupils are equal, round, and reactive to light.  Cardiovascular:     Rate and Rhythm: Normal rate and regular rhythm.     Heart sounds: Normal heart sounds.  Pulmonary:     Effort: Pulmonary effort is normal. No respiratory distress.     Breath sounds: No wheezing.  Abdominal:     General: Bowel sounds are normal.     Palpations: Abdomen is soft.     Tenderness: There is abdominal tenderness. There is no guarding or rebound.  Musculoskeletal:     Cervical back: Neck supple.     Right lower leg: Edema present.     Left lower leg: Edema present.     Comments: Tenderness palpation right knee, no obvious deformities, normal range of motion, large bruise noted over the lateral aspect of the right calf  Skin:    General: Skin is warm and dry.     Comments: Chronic venous stasis changes bilateral lower extremities  Neurological:     Mental Status: She is alert and oriented to person, place, and time.     Comments: Symmetric strength in all 4 extremities, fluent speech  Psychiatric:        Mood and  Affect: Mood normal.     ED Results / Procedures / Treatments   Labs (all labs ordered are listed, but only abnormal results are displayed) Labs Reviewed  BASIC METABOLIC PANEL - Abnormal; Notable for the following components:      Result Value   Creatinine, Ser 1.80 (*)    Calcium 11.5 (*)    GFR, Estimated 30 (*)    All other components within normal limits  CBC - Abnormal; Notable for the following components:   WBC 13.1 (*)    RBC 3.46 (*)    Hemoglobin 11.3 (*)    MCV 111.6 (*)    MCHC 29.3 (*)    All other components within normal limits  URINALYSIS, ROUTINE W REFLEX MICROSCOPIC - Abnormal; Notable for the following components:   APPearance CLOUDY (*)    Specific Gravity, Urine 1.004 (*)    Hgb urine dipstick MODERATE (*)    Nitrite POSITIVE (*)    Leukocytes,Ua LARGE (*)    WBC, UA >50 (*)    Bacteria, UA MANY (*)    Non Squamous Epithelial 0-5 (*)    All other components within normal limits  LACTIC ACID, PLASMA - Abnormal; Notable for the following components:   Lactic Acid, Venous 0.4 (*)    All other components within normal limits  RESPIRATORY PANEL BY RT PCR (FLU A&B, COVID)  URINE CULTURE  LACTIC ACID, PLASMA  CBG MONITORING, ED  I-STAT BETA HCG BLOOD, ED ([facility], [facility], [facility] ONLY)    EKG EKG Interpretation  Date/Time:  Monday June 03 2020 13:33:13 EDT Ventricular Rate:  60 PR Interval:  210 QRS Duration: 76 QT Interval:  386 QTC Calculation: 386 R Axis:   -21 Text Interpretation:  Poor data quality, interpretation may be adversely affected Sinus rhythm with 1st degree A-V block Possible Anterolateral infarct , age undetermined Abnormal ECG When compared with ECG of 05/14/2020, No significant change was found Confirmed by Delora Fuel ([id number]) on 06/04/2020 12:33:06 AM   Radiology DG Tibia/Fibula Right  Result Date: 06/04/2020 CLINICAL DATA:  Pain after a fall. EXAM: RIGHT TIBIA AND FIBULA - 2 VIEW COMPARISON:  None. FINDINGS: There is no evidence of  fracture or other focal bone lesions. Soft tissues are unremarkable. IMPRESSION: Negative. Electronically Signed   By: Misty Stanley M.D.   On: 06/04/2020 05:17   CT Head Wo Contrast  Result Date: 06/03/2020 CLINICAL DATA:  Head trauma, coagulopathy.  Weakness. EXAM: CT HEAD WITHOUT CONTRAST TECHNIQUE: Contiguous axial images were obtained from the base of the skull through the vertex without intravenous contrast. COMPARISON:  CT head 02/20/2020 FINDINGS: Brain: No evidence of acute large vascular territory infarction, hemorrhage, hydrocephalus, extra-axial collection or mass lesion/mass effect. Redemonstrated partially empty sella. Vascular: No hyperdense vessel identified. Skull: No acute fracture. Sinuses/Orbits: Partially imaged right maxillary sinus retention cyst. Otherwise, the sinuses are clear. No acute orbital abnormality. Other: No mastoid effusions IMPRESSION: 1. No evidence of acute intracranial abnormality. 2. Redemonstrated partially empty sella, which may be an incidental finding but can be seen with idiopathic intracranial hypertension in the correct clinical setting. Electronically Signed   By: Margaretha Sheffield MD   On: 06/03/2020 14:57   DG Knee Complete 4 Views Right  Result Date: 06/04/2020 CLINICAL DATA:  Pain after a fall. EXAM: RIGHT KNEE - COMPLETE 4+ VIEW COMPARISON:  None. FINDINGS: No fracture. No subluxation or dislocation. Marked loss of joint space noted medial compartment. Hypertrophic spurring evident in all 3 compartments with probable small joint effusion in the suprapatellar bursa. IMPRESSION: Tricompartmental degenerative changes without acute bony abnormality. Electronically Signed   By: Misty Stanley M.D.   On: 06/04/2020 05:16    Procedures Procedures (including critical care time)  CRITICAL CARE Performed by: Merryl Hacker   Total critical care time: 35 minutes  Critical care time was exclusive of separately billable procedures and treating other  patients.  Critical care was necessary to treat or prevent imminent or life-threatening deterioration.  Critical care was time spent personally by me on the following activities: development of treatment plan with patient and/or surrogate as well as nursing, discussions with consultants, evaluation of patient's response to treatment, examination of patient, obtaining history from patient or surrogate, ordering and performing treatments and interventions, ordering and review of laboratory studies, ordering and review of radiographic studies, pulse oximetry and re-evaluation of patient's condition.  Medications Ordered in ED Medications  sodium chloride 0.9 % bolus 500 mL (500 mLs Intravenous New Bag/Given 06/04/20 0504)  piperacillin-tazobactam (ZOSYN) IVPB 3.375 g (0 g Intravenous Stopped 06/04/20 0538)  iohexol (OMNIPAQUE) 9 MG/ML oral solution 500 mL (500 mLs Oral Contrast Given 06/04/20 0608)    ED Course  I have reviewed the triage vital signs and the nursing notes.  Pertinent labs & imaging results that were available during my care of the patient were reviewed by me and considered in my medical decision making (see chart for details).  Clinical Course as of Jun 04 702  Tue Jun 04, 2020  6606 Spoke with Dr. Claudia Desanctis, urology.  Nothing specifically to do from a urology perspective.  Recommends Foley catheter if she is not emptying fully.  Will await CT findings and plan for admission.   [CH]    Clinical Course User Index [CH] Xaivier Malay, Barbette Hair, MD   MDM Rules/Calculators/A&P                          Patient presents with confusion, generalized weakness and deconditioning.  Recently hospitalized  for colovesicular fistula.  She is also reporting some abdominal pain.  She is nontoxic and afebrile.  Does not overtly appear septic appearing although her blood pressure has down trended some.  Daughter reports that this is normal for her.  I have reviewed her lab work sent from triage.  She  now has a leukocytosis and her urine is nitrite positive with white blood cell clumps.  Given her clinical picture, will elect to treat although I asked presume she will have some level of contamination given known fistula.  Patient was given Zosyn and fluids.  Lactate is normal.  She reports multiple falls.  CT head is negative for acute traumatic injury.  X-rays of the right lower extremity without obvious deformity.  Covid testing negative.  CT scan of the abdomen pelvis obtained to rule out worsening infection.  Patient will likely need admission to the hospital for ongoing IV antibiotics and physical therapy.   Final Clinical Impression(s) / ED Diagnoses Final diagnoses:  Colovesical fistula  Complicated UTI (urinary tract infection)  Generalized weakness    Rx / DC Orders ED Discharge Orders    None       Rigel Filsinger, Barbette Hair, MD 06/04/20 0768    Merryl Hacker, MD 06/04/20 [phone]

## 2020-06-04 NOTE — Progress Notes (Signed)
RT set up patient's home CPAP and placed on bedside table. Patient applied mask to herself without assistance. RT will monitor as needed.

## 2020-06-04 NOTE — ED Provider Notes (Signed)
Received the patient in signout from Dr. Dina Rich.  Briefly the patient is a 59 year old female with a significant past medical history of a colovesicular fistula.  Plans for colectomy after she has been off immunosuppressants.  Arrived with worsening fatigue and frequent falls.  Found to have a urinary tract infection and leukocytosis.  Discussed with general surgery, Will Creig Hines, PA-C  will come and see the patient at bedside recommended hospitalist admission and IV antibiotics.   Deno Etienne, DO 06/04/20 [phone]

## 2020-06-04 NOTE — Assessment & Plan Note (Addendum)
-   differential includes infection from CV fistula vs glucocorticoid withdrawal; she does not appear toxic/infected clinically; I believe her urine is colonized at this point and she has been on IV abx last hospitalization as well as discharged with 10 days Augmentin upon 9/26 discharge; her prednisone was chronically 10 mg daily for years she says and was taken down to 5 mg daily followed almost immediately by worsening weakness/lethargy, falling, confusion, loss of appetite, and nausea unrelieved by zofran (this appears consistent at least in part due to some HPA suppression and too aggressive of steroid decrease) - check cortisol; hold off on stim test - will place back on prednisone 10 mg (patient last took 5 mg on Sunday; nothing on Monday due to severe nausea and weakness); if unable to tolerate PO then may need to try IV; would decrease prednisone at discharge by 1 mg every 1-2 weeks  - check TSH - follow up cultures, PCT and continue zosyn but low threshold to discontinue if remains nontoxic appearing; however remains at risk for recurrent infections until fistula is repaired.

## 2020-06-04 NOTE — Assessment & Plan Note (Signed)
-   database reviewed; last fill of alprazolam was 11/21/19; hold off on further benzos at this time, especially in setting of weakness/falls - check UDS

## 2020-06-04 NOTE — Assessment & Plan Note (Addendum)
-   well known to surgery and urology; tentative plan is for repair if/when patient able to tolerate surgery; currently per sister she has been getting weaker and falling more often since discharge on 9/26 (see weakness) - UA appearance is concerning for infection vs colonization from fistula; no obvious urinary symptoms, afebrile; mild leukocytosis (13.1); but weakness may be due to infection vs steroid withdrawal  - CT abd/pelvis shows subtle diverticulitis, known fistula with paracolonic abscess, and enlarged right renal system but no gross hydroureter - obtain blood cultures - continue zosyn for now; trend PCT - surgery aware, follow up consult  - may need to place drain into abscess via IR? - start on bowel regimen given previous constipation seen on CT - will have pharmacy start TPN while patient here given poor nutritional status; Right PICC in place with plans to start TPN at home; monitor response to steroid increase as well

## 2020-06-04 NOTE — Evaluation (Signed)
Physical Therapy Evaluation Patient Details Name: Valerie [name] MRN: [id number] DOB: 1960-10-19 Today's Date: 06/04/2020   History of Present Illness  Pt is a 59 y/o female admitted secondary to progressive weakness and frequent falls. 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 CT abdomen/pelvis revealed left colonic diverticulosis with pericolonic edema/inflammation concerning for diverticulitis.  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 PMH includes bipolar disorder, CAD, DM, CKD, and psoriatic arthritis.   Clinical Impression  Pt admitted secondary to problem above with deficits below. Pt initially lethargic, but noted improved alertness with change of position. Required mod A to get to sitting on EOB. Pt unable to touch feet to floor from stretcher height and was unsafe to attempt further mobility with +1 assist. Pt and family wanting pt to return home. Recommend resuming [facility] services. Will continue to follow acutely to maximize functional mobility independence and safety.     Follow Up Recommendations Home health PT;Supervision/Assistance - 24 hour (pt wanting to go home)    Equipment Recommendations  None recommended by PT    Recommendations for Other Services       Precautions / Restrictions Precautions Precautions: Fall Precaution Comments: Has had 3 falls within the past month.  Restrictions Weight Bearing Restrictions: No      Mobility  Bed Mobility Overal bed mobility: Needs Assistance Bed Mobility: Supine to Sit;Sit to Supine     Supine to sit: Mod assist Sit to supine: Min assist   General bed mobility comments: Mod A for trunk elevation to come to sitting. Increased time and effort required. Pt could not reach floor from stretcher height, so did not attempt standing given current deficits. Min A for LE assist for return to supine.   Transfers                    Ambulation/Gait                 Stairs            Wheelchair Mobility    Modified Rankin (Stroke Patients Only)       Balance Overall balance assessment: Needs assistance Sitting-balance support: Feet unsupported;No upper extremity supported Sitting balance-Leahy Scale: Fair                                       Pertinent Vitals/Pain Pain Assessment: Faces Faces Pain Scale: Hurts even more Pain Location: R knee Pain Descriptors / Indicators: Aching;Operative site guarding Pain Intervention(s): Monitored during session;Limited activity within patient's tolerance;Repositioned    Home Living Family/patient expects to be discharged to:: Private residence Living Arrangements: Non-relatives/Friends (roommate) Available Help at Discharge: Family;Available 24 hours/day Type of Home: House Home Access: Stairs to enter   [employer] of Steps: 1 (can fit walker on step) Home Layout: Two level;Able to live on main level with bedroom/bathroom Home Equipment: Gilford Rile - 2 wheels;Cane - single point;Wheelchair - manual;Bedside commode      Prior Function Level of Independence: Needs assistance   Gait / Transfers Assistance Needed: Has had to transition to RW in home. Per daughter, needs assist to walk. Uses WC outside of home.   ADL's / Homemaking Assistance Needed: Needs occasional assist for ADL tasks. Moreso since getting weaker.         Hand Dominance        Extremity/Trunk Assessment   Upper Extremity Assessment Upper Extremity Assessment:  Defer to OT evaluation    Lower Extremity Assessment Lower Extremity Assessment: RLE deficits/detail;LLE deficits/detail RLE Deficits / Details: noted bruising around knee. Increased effort required to flex knee secondary to pain.  LLE Deficits / Details: Bruses on shin.     Cervical / Trunk Assessment Cervical / Trunk Assessment: Normal  Communication   Communication: No difficulties  Cognition Arousal/Alertness:  Lethargic Behavior During Therapy: WFL for tasks assessed/performed Overall Cognitive Status: Within Functional Limits for tasks assessed                                 General Comments: Pt initially very lethargic. Increased alertness noted with change in position.       General Comments General comments (skin integrity, edema, etc.): Pt's daughter present throughout session.     Exercises General Exercises - Lower Extremity Ankle Circles/Pumps: AROM;Both;10 reps Long Arc Quad: AROM;Both;5 reps;Seated   Assessment/Plan    PT Assessment Patient needs continued PT services  PT Problem List Decreased strength;Decreased mobility;Decreased activity tolerance;Decreased balance;Decreased knowledge of use of DME;Pain       PT Treatment Interventions DME instruction;Gait training;Therapeutic activities;Therapeutic exercise;Patient/family education;Balance training;Functional mobility training    PT Goals (Current goals can be found in the Care Plan section)  Acute Rehab PT Goals Patient Stated Goal: to go home PT Goal Formulation: With patient Time For Goal Achievement: 06/18/20 Potential to Achieve Goals: Good    Frequency Min 3X/week   Barriers to discharge        Co-evaluation               AM-PAC PT "6 Clicks" Mobility  Outcome Measure Help needed turning from your back to your side while in a flat bed without using bedrails?: A Little Help needed moving from lying on your back to sitting on the side of a flat bed without using bedrails?: A Lot Help needed moving to and from a bed to a chair (including a wheelchair)?: A Lot Help needed standing up from a chair using your arms (e.g., wheelchair or bedside chair)?: A Lot Help needed to walk in hospital room?: A Lot Help needed climbing 3-5 steps with a railing? : A Lot 6 Click Score: 13    End of Session   Activity Tolerance: Patient tolerated treatment well Patient left: in bed;with call bell/phone  within reach;with family/visitor present (on stretcher in ED ) Nurse Communication: Mobility status PT Visit Diagnosis: Muscle weakness (generalized) (M62.81);History of falling (Z91.81);Unsteadiness on feet (R26.81)    Time: 0867-6195 PT Time Calculation (min) (ACUTE ONLY): 16 min   Charges:   PT Evaluation $PT Eval Moderate Complexity: 1 Mod          Reuel Derby, PT, DPT  Acute Rehabilitation Services  Pager: [phone] Office: [phone]   Rudean Hitt 06/04/2020, 11:35 AM

## 2020-06-04 NOTE — Plan of Care (Signed)

## 2020-06-04 NOTE — Progress Notes (Signed)
Report given to night nurse [place].  Pt. In bed resting watching TV. Purewick in place. PICC line in use, Zosyn and TPN infusing. All meds administered per MAR. Patient complaining of pain, messaged MD; awaiting orders. No distress noted.

## 2020-06-05 ENCOUNTER — Other Ambulatory Visit: Payer: Self-pay

## 2020-06-05 DIAGNOSIS — N390 Urinary tract infection, site not specified: Secondary | ICD-10-CM | POA: Diagnosis not present

## 2020-06-05 DIAGNOSIS — N321 Vesicointestinal fistula: Secondary | ICD-10-CM | POA: Diagnosis not present

## 2020-06-05 DIAGNOSIS — E440 Moderate protein-calorie malnutrition: Secondary | ICD-10-CM | POA: Insufficient documentation

## 2020-06-05 DIAGNOSIS — E1140 Type 2 diabetes mellitus with diabetic neuropathy, unspecified: Secondary | ICD-10-CM | POA: Diagnosis not present

## 2020-06-05 LAB — CBC WITH DIFFERENTIAL/PLATELET
Abs Immature Granulocytes: 0.06 10*3/uL (ref 0.00–0.07)
Basophils Absolute: 0 10*3/uL (ref 0.0–0.1)
Basophils Relative: 0 %
Eosinophils Absolute: 0.1 10*3/uL (ref 0.0–0.5)
Eosinophils Relative: 1 %
HCT: 31.7 % — ABNORMAL LOW (ref 36.0–46.0)
Hemoglobin: 9.7 g/dL — ABNORMAL LOW (ref 12.0–15.0)
Immature Granulocytes: 1 %
Lymphocytes Relative: 10 %
Lymphs Abs: 1 10*3/uL (ref 0.7–4.0)
MCH: 33.4 pg (ref 26.0–34.0)
MCHC: 30.6 g/dL (ref 30.0–36.0)
MCV: 109.3 fL — ABNORMAL HIGH (ref 80.0–100.0)
Monocytes Absolute: 0.8 10*3/uL (ref 0.1–1.0)
Monocytes Relative: 8 %
Neutro Abs: 8.2 10*3/uL — ABNORMAL HIGH (ref 1.7–7.7)
Neutrophils Relative %: 80 %
Platelets: 160 10*3/uL (ref 150–400)
RBC: 2.9 MIL/uL — ABNORMAL LOW (ref 3.87–5.11)
RDW: 14.6 % (ref 11.5–15.5)
WBC: 10.2 10*3/uL (ref 4.0–10.5)
nRBC: 0 % (ref 0.0–0.2)

## 2020-06-05 LAB — PHOSPHORUS: Phosphorus: 2.6 mg/dL (ref 2.5–4.6)

## 2020-06-05 LAB — COMPREHENSIVE METABOLIC PANEL
ALT: 15 U/L (ref 0–44)
AST: 13 U/L — ABNORMAL LOW (ref 15–41)
Albumin: 2.3 g/dL — ABNORMAL LOW (ref 3.5–5.0)
Alkaline Phosphatase: 75 U/L (ref 38–126)
Anion gap: 4 — ABNORMAL LOW (ref 5–15)
BUN: 15 mg/dL (ref 6–20)
CO2: 32 mmol/L (ref 22–32)
Calcium: 10.8 mg/dL — ABNORMAL HIGH (ref 8.9–10.3)
Chloride: 106 mmol/L (ref 98–111)
Creatinine, Ser: 1.6 mg/dL — ABNORMAL HIGH (ref 0.44–1.00)
GFR, Estimated: 35 mL/min — ABNORMAL LOW (ref >60)
Glucose, Bld: 110 mg/dL — ABNORMAL HIGH (ref 70–99)
Potassium: 4.2 mmol/L (ref 3.5–5.1)
Sodium: 142 mmol/L (ref 135–145)
Total Bilirubin: 0.5 mg/dL (ref 0.3–1.2)
Total Protein: 4.9 g/dL — ABNORMAL LOW (ref 6.5–8.1)

## 2020-06-05 LAB — URINE CULTURE: Culture: >=100000 — AB

## 2020-06-05 LAB — GLUCOSE, CAPILLARY
Glucose-Capillary: 113 mg/dL — ABNORMAL HIGH (ref 70–99)
Glucose-Capillary: 118 mg/dL — ABNORMAL HIGH (ref 70–99)
Glucose-Capillary: 122 mg/dL — ABNORMAL HIGH (ref 70–99)
Glucose-Capillary: 123 mg/dL — ABNORMAL HIGH (ref 70–99)
Glucose-Capillary: 132 mg/dL — ABNORMAL HIGH (ref 70–99)
Glucose-Capillary: 133 mg/dL — ABNORMAL HIGH (ref 70–99)
Glucose-Capillary: 162 mg/dL — ABNORMAL HIGH (ref 70–99)

## 2020-06-05 LAB — MAGNESIUM: Magnesium: 2.1 mg/dL (ref 1.7–2.4)

## 2020-06-05 MED ORDER — ROSUVASTATIN CALCIUM 5 MG PO TABS
5.0000 mg | ORAL_TABLET | Freq: Every day | ORAL | Status: DC
  Administered 2020-06-05 – 2020-06-13 (×9): 5 mg via ORAL
  Filled 2020-06-05 (×9): qty 1

## 2020-06-05 MED ORDER — DONEPEZIL HCL 10 MG PO TABS
10.0000 mg | ORAL_TABLET | Freq: Every day | ORAL | Status: DC
  Administered 2020-06-05 – 2020-06-13 (×9): 10 mg via ORAL
  Filled 2020-06-05 (×9): qty 1

## 2020-06-05 MED ORDER — ENOXAPARIN SODIUM 40 MG/0.4ML ~~LOC~~ SOLN
40.0000 mg | SUBCUTANEOUS | Status: DC
  Administered 2020-06-05 – 2020-06-12 (×8): 40 mg via SUBCUTANEOUS
  Filled 2020-06-05 (×9): qty 0.4

## 2020-06-05 MED ORDER — NADOLOL 20 MG PO TABS
10.0000 mg | ORAL_TABLET | Freq: Every day | ORAL | Status: DC
  Administered 2020-06-05 – 2020-06-13 (×8): 10 mg via ORAL
  Filled 2020-06-05 (×9): qty 1

## 2020-06-05 MED ORDER — CEFDINIR 300 MG PO CAPS
300.0000 mg | ORAL_CAPSULE | Freq: Two times a day (BID) | ORAL | Status: DC
  Administered 2020-06-06 – 2020-06-13 (×15): 300 mg via ORAL
  Filled 2020-06-05 (×16): qty 1

## 2020-06-05 MED ORDER — LAMOTRIGINE 25 MG PO TABS
75.0000 mg | ORAL_TABLET | Freq: Every day | ORAL | Status: DC
  Administered 2020-06-06 – 2020-06-13 (×8): 75 mg via ORAL
  Filled 2020-06-05 (×8): qty 3

## 2020-06-05 MED ORDER — ASPIRIN EC 81 MG PO TBEC
81.0000 mg | DELAYED_RELEASE_TABLET | Freq: Every day | ORAL | Status: DC
  Administered 2020-06-05 – 2020-06-13 (×9): 81 mg via ORAL
  Filled 2020-06-05 (×9): qty 1

## 2020-06-05 MED ORDER — ESCITALOPRAM OXALATE 20 MG PO TABS
30.0000 mg | ORAL_TABLET | Freq: Every day | ORAL | Status: DC
  Administered 2020-06-06 – 2020-06-13 (×8): 30 mg via ORAL
  Filled 2020-06-05 (×9): qty 1

## 2020-06-05 MED ORDER — ENSURE ENLIVE PO LIQD: 237.0000 mL | Freq: Two times a day (BID) | ORAL | Status: DC

## 2020-06-05 MED ORDER — TRAVASOL 10 % IV SOLN
INTRAVENOUS | Status: AC
  Filled 2020-06-05: qty 777.6

## 2020-06-05 MED ORDER — TRAZODONE HCL 100 MG PO TABS
200.0000 mg | ORAL_TABLET | Freq: Every day | ORAL | Status: DC
  Administered 2020-06-05 – 2020-06-12 (×8): 200 mg via ORAL
  Filled 2020-06-05 (×8): qty 2

## 2020-06-05 MED ORDER — LAMOTRIGINE 150 MG PO TABS
150.0000 mg | ORAL_TABLET | Freq: Every day | ORAL | Status: DC
  Administered 2020-06-05 – 2020-06-12 (×8): 150 mg via ORAL
  Filled 2020-06-05 (×8): qty 1

## 2020-06-05 NOTE — Progress Notes (Signed)
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  GOAL:   Patient will meet greater than or equal to 90% of their needs  MONITOR:   PO intake, Labs, Weight trends, Skin, I & O's  REASON FOR ASSESSMENT:   Consult Assessment of nutrition requirement/status, New TPN/TNA  ASSESSMENT:   Valerie [name] is a 59 yo CF with PMH DMII, CKDIII, peripheral neuropathy, Psoriatic arthritis (chronic prednisone use), OSA, CAD and now recent diagnosis of colovesical fistula who was recently hospitalized 9/21 - 9/26 for abdominal pain and colitis related to underlying fistula. She is presenting today with worsening weakness, frequent falling, lethargy, worsening nausea, intermittent confusion, and has significantly lost her appetite and is not eating much per her sister who is bedside in the ER.  Pt admitted with colovesical fistula and weakness.   10/7- PICC placed 10/12- TPN initiated  Reviewed I/O's: -114 ml x 24 hours and -64 ml since admission  UOP: 1.4 L x 24 hours  Case discussed with pharmacist and MD, who both confirm that TPN was initiated yesterday and is tolerating well.   Spoke with pt and close friend at bedside. Both confirm that pt has experienced a general decline in health over the past 6 months. Pt reports progressive decreased appetite due to significant nausea and vomiting accompanied after eating. Per pt friend, this has gotten a lot worse over the past 3 weeks after pt was previously discharged from the hospital with sepsis (over the past 2 weeks pt had been  vomiting most PO intake, including jello). Pt has only been able to eat 2-3 times per day and intake consist of broth, tomato soup, wonton soup, and ice chips. Pt reports she was unable to eat breakfast today due to nausea.   Pt shares her UBW is around 225#. She estimates she has lost about 30 pounds over the past 3 months. Reviewed wt hx; pt has experienced a 13.4% wt loss over the past 6 months, which is significant for time frame. Pt has also had weakness and decline in mobility. She was working with home health PT PTA, but has also experienced increased falls in the past 2 weeks (averaging 3-4 falls per week during a 10-15 foot walk from her bedroom to her bathroom at home).   Discussed with pt soft diet and reviewed menu items she could choose that were appealing to her. Educated pt on TPN and how this will help support her nutritional status (pt aware, as she was supposed to start home TPN on 06/04/20). Pt currently receiving TPN at 40 ml/hr, which will provides 1045 kcals and 52 grams protein, meeting 58% of estimated kcal needs and 47% of estimated protein needs).   Medications reviewed and include prednisone and senokot.   Labs reviewed: K, Mg, and Phos WDL, CBGS: 113-123 (inpatient orders for glycemic control are 0-9 units insulin aspart every 4 hours).   NUTRITION - FOCUSED PHYSICAL EXAM:    Most Recent Value  Orbital Region No depletion  Upper Arm Region Mild depletion  Thoracic and Lumbar Region No depletion  Buccal Region No depletion  Temple Region Mild depletion  Clavicle Bone Region No depletion  Clavicle and Acromion Bone Region No depletion  Scapular Bone Region No depletion  Dorsal Hand Mild depletion  Patellar Region Mild depletion  Anterior Thigh Region Mild depletion  Posterior Calf Region Mild depletion  Edema (RD Assessment) Mild  Hair Reviewed  Eyes Reviewed  Mouth Reviewed  Skin Reviewed  Nails Reviewed       Diet Order:   Diet Order            DIET  SOFT Room service appropriate? Yes; Fluid consistency: Thin  Diet effective now                 EDUCATION NEEDS:   Education needs have been addressed  Skin:  Skin Assessment: Reviewed RN Assessment  Last BM:  06/02/20  Height:   Ht Readings from Last 1 Encounters:  06/04/20 5\' 2"  (1.575 m)    Weight:   Wt Readings from Last 1 Encounters:  06/04/20 90.3 kg    Ideal Body Weight:  50 kg  BMI:  Body mass index is 36.4 kg/m.  Estimated Nutritional Needs:   Kcal:  1800-2000  Protein:  110-125 grams  Fluid:  > 1.8 L    Loistine Chance, RD, LDN, [place] Registered Dietitian II Certified Diabetes Care and Education Specialist Please refer to [facility] for RD and/or RD on-call/weekend/after hours pager

## 2020-06-05 NOTE — Progress Notes (Signed)
Subjective: Feels ok.  Asking when she can go home.  States she is very weak in her legs and having trouble with falls at home and ambulating with her walker due to severe weakness.  Ate some small stuff overnight.  Nausea seems improved currently.  Waiting for breakfast this am.  Had a BM since admission that was a good BM.  Pain is mild in LLQ  ROS: See above, otherwise other systems negative  Objective: Vital signs in last 24 hours: Temp:  [97.6 F (36.4 C)-98.7 F (37.1 C)] 98.3 F (36.8 C) (10/13 0511) Pulse Rate:  [55-73] 62 (10/13 0511) Resp:  [10-25] 15 (10/13 0511) BP: (90-136)/(46-75) 110/62 (10/13 0511) SpO2:  [89 %-97 %] 94 % (10/13 0511) Last BM Date: 06/02/20  Intake/Output from previous day: 10/12 0701 - 10/13 0700 In: 1236.1 [P.O.:180; I.V.:506.2; IV Piggyback:550] Out: 1350 [Urine:1350] Intake/Output this shift: No intake/output data recorded.  PE: Heart: regular Lungs: CTAB Abd: soft, mildly tender in LLQ, +BS, ND, obese  Lab Results:  Recent Labs    06/03/20 1339 06/05/20 0356  WBC 13.1* 10.2  HGB 11.3* 9.7*  HCT 38.6 31.7*  PLT 175 160   BMET Recent Labs    06/03/20 1339 06/05/20 0356  NA 143 142  K 3.7 4.2  CL 102 106  CO2 30 32  GLUCOSE 95 110*  BUN 12 15  CREATININE 1.80* 1.60*  CALCIUM 11.5* 10.8*   PT/INR No results for input(s): LABPROT, INR in the last 72 hours. CMP     Component Value Date/Time   NA 142 06/05/2020 0356   K 4.2 06/05/2020 0356   CL 106 06/05/2020 0356   CO2 32 06/05/2020 0356   GLUCOSE 110 (H) 06/05/2020 0356   BUN 15 06/05/2020 0356   CREATININE 1.60 (H) 06/05/2020 0356   CREATININE 1.73 (H) 02/08/2018 1020   CALCIUM 10.8 (H) 06/05/2020 0356   PROT 4.9 (L) 06/05/2020 0356   ALBUMIN 2.3 (L) 06/05/2020 0356   AST 13 (L) 06/05/2020 0356   ALT 15 06/05/2020 0356   ALKPHOS 75 06/05/2020 0356   BILITOT 0.5 06/05/2020 0356   GFRNONAA 35 (L) 06/05/2020 0356   GFRNONAA 32 (L) 02/08/2018 1020    GFRAA 38 (L) 05/15/2020 0210   GFRAA 37 (L) 02/08/2018 1020   Lipase     Component Value Date/Time   LIPASE 26 05/14/2020 1514       Studies/Results: CT ABDOMEN PELVIS WO CONTRAST  Result Date: 06/04/2020 CLINICAL DATA:  Abdominal pain. EXAM: CT ABDOMEN AND PELVIS WITHOUT CONTRAST TECHNIQUE: Multidetector CT imaging of the abdomen and pelvis was performed following the standard protocol without IV contrast. COMPARISON:  05/14/2020 FINDINGS: Lower chest: Atelectasis or scarring noted right middle lobe. Dependent atelectasis noted lung bases bilaterally. Hepatobiliary: No focal abnormality in the liver on this study without intravenous contrast. Gallbladder is surgically absent. No intrahepatic or extrahepatic biliary dilation. Pancreas: No focal mass lesion. No dilatation of the main duct. No intraparenchymal cyst. No peripancreatic edema. Spleen: Insert spleen Adrenals/Urinary Tract: No adrenal nodule or mass. No gross renal mass lesion evident on this noncontrast CT scan. No evidence for urinary stone disease. Mild fullness of the right intrarenal collecting system is new in the interval. No gross hydroureter. Wall thickening noted along the bladder dome is associated with a tiny gas bubble in the bladder lumen. Stomach/Bowel: Small hiatal hernia. Stomach is unremarkable. No gastric wall thickening. No evidence of outlet obstruction. Duodenum is normally positioned  as is the ligament of Treitz. No small bowel wall thickening. No small bowel dilatation. The terminal ileum is normal. The appendix is not visualized, but there is no edema or inflammation in the region of the cecum. There is left colonic diverticulosis with subtle pericolonic edema/inflammation associated with the proximal sigmoid segment. 2 soft tissue tracks communicate between the proximal to mid sigmoid colon in the dome of bladder 1 of which goes from the anterior wall of the sigmoid colon to the anterior bladder dome and is best  demonstrated on sagittal images 80-89. A second confluent area of soft tissue extends from the inferior wall of the sigmoid colon to the mid bladder no (sagittal 78/7) with gas and debris between the colon in the dome of bladder. Either one or both areas could represent colovesical fistula. The collection of debris and gas wedged between the inferior wall the sigmoid colon and mid bladder dome suggests para colonic abscess and this may be intramural in the bladder wall. Vascular/Lymphatic: No abdominal aortic aneurysm. There is no gastrohepatic or hepatoduodenal ligament lymphadenopathy. No retroperitoneal or mesenteric lymphadenopathy. No pelvic sidewall lymphadenopathy. Reproductive: Inferior pelvis obscured by beam hardening artifact from bilateral hip replacement, but no adnexal mass evident. Other: No intraperitoneal free fluid. Musculoskeletal: Status post bilateral hip replacement. No worrisome lytic or sclerotic osseous abnormality. T9 compression deformity again noted. IMPRESSION: 1. Left colonic diverticulosis with subtle pericolonic edema/inflammation associated with the proximal sigmoid segment. Imaging features are compatible with diverticulitis. 2. 2 different tracks of soft tissue between the proximal to mid sigmoid colon and the bladder dome. Either one or both areas could represent colovesical fistula. The collection of debris and gas wedged between the inferior wall the sigmoid colon and mid bladder dome suggests para colonic abscess and this may be intramural in the bladder wall. Overall imaging features are similar to 05/14/2020. 3. Mild fullness of the right intrarenal collecting system without gross hydroureter. This finding is new since 05/14/2020. 4. Small hiatal hernia. 5. Status post bilateral hip replacement. Electronically Signed   By: Misty Stanley M.D.   On: 06/04/2020 07:24   DG Tibia/Fibula Right  Result Date: 06/04/2020 CLINICAL DATA:  Pain after a fall. EXAM: RIGHT TIBIA AND  FIBULA - 2 VIEW COMPARISON:  None. FINDINGS: There is no evidence of fracture or other focal bone lesions. Soft tissues are unremarkable. IMPRESSION: Negative. Electronically Signed   By: Misty Stanley M.D.   On: 06/04/2020 05:17   CT Head Wo Contrast  Result Date: 06/03/2020 CLINICAL DATA:  Head trauma, coagulopathy.  Weakness. EXAM: CT HEAD WITHOUT CONTRAST TECHNIQUE: Contiguous axial images were obtained from the base of the skull through the vertex without intravenous contrast. COMPARISON:  CT head 02/20/2020 FINDINGS: Brain: No evidence of acute large vascular territory infarction, hemorrhage, hydrocephalus, extra-axial collection or mass lesion/mass effect. Redemonstrated partially empty sella. Vascular: No hyperdense vessel identified. Skull: No acute fracture. Sinuses/Orbits: Partially imaged right maxillary sinus retention cyst. Otherwise, the sinuses are clear. No acute orbital abnormality. Other: No mastoid effusions IMPRESSION: 1. No evidence of acute intracranial abnormality. 2. Redemonstrated partially empty sella, which may be an incidental finding but can be seen with idiopathic intracranial hypertension in the correct clinical setting. Electronically Signed   By: Margaretha Sheffield MD   On: 06/03/2020 14:57   DG Knee Complete 4 Views Right  Result Date: 06/04/2020 CLINICAL DATA:  Pain after a fall. EXAM: RIGHT KNEE - COMPLETE 4+ VIEW COMPARISON:  None. FINDINGS: No fracture. No subluxation or  dislocation. Marked loss of joint space noted medial compartment. Hypertrophic spurring evident in all 3 compartments with probable small joint effusion in the suprapatellar bursa. IMPRESSION: Tricompartmental degenerative changes without acute bony abnormality. Electronically Signed   By: Misty Stanley M.D.   On: 06/04/2020 05:16    Anti-infectives: Anti-infectives (From admission, onward)   Start     Dose/Rate Route Frequency Ordered Stop   06/04/20 1830  piperacillin-tazobactam (ZOSYN) IVPB  3.375 g        3.375 g 12.5 mL/hr over 240 Minutes Intravenous Every 8 hours 06/04/20 1806     06/04/20 0430  piperacillin-tazobactam (ZOSYN) IVPB 3.375 g        3.375 g 100 mL/hr over 30 Minutes Intravenous  Once 06/04/20 0422 06/04/20 0538       Assessment/Plan  Psoriatic arthritis -immunosuppressed; prednisone, methotrexate, Plaquenil and Enbrel, which are all being held by PCP per plan discussed at [facility] last admission, although prednisone has been reordered here.  Not sure if this was weaned to off at home or if she has actually been taking this one. Hx CAD/MI -10/2019 Hx CVA -on Plavix, but currently on hold in case she were to need something this admission Stage III kidney disease Multiple falls/severe deconditioning - needs PT/OT eval Moderate malnutrition -prealbumin 15.  On TNA, shakes, and soft diet Failure to thrive Ongoing tobacco use -42 years Hx of anxiety and depression UTI - urine cx with >100,000 colonies of gram negative, c/w last urine cx at [facility] c/w colovesical fistula  Colovesicular fistula with sigmoid diverticulitis -diverticulitis is improved on this set of imaging from her admission at [facility]. -cont abx therapy for UTI currently -agree with TNA to assist with nutrition here and as an outpatient -if she improves and is able to get discharged, would ideally like to keep plan to stay off of immunosuppressants to try for a 1 stage procedure by Dr. Johney Maine.  However, if she is unable to improve well enough to get back home, may need [facility]'s while here.  FEN - soft diet, TNA, ensure VTE - ok for chemical prophylaxis from our standpoint, hold plavix ID - [email or web address]   LOS: 1 day    Henreitta Cea , [facility] 06/05/2020, 9:42 AM Please see Amion for pager number during day hours 7:00am-4:30pm or 7:00am -11:30am on weekends

## 2020-06-05 NOTE — Progress Notes (Signed)
PHARMACY - TOTAL PARENTERAL NUTRITION CONSULT NOTE   Indication: Colovesicular Fistula  Patient Measurements: Height: 5\' 2"  (157.5 cm) Weight: 90.3 kg (199 lb) IBW/kg (Calculated) : 50.1 TPN AdjBW (KG): 60.1 Body mass index is 36.4 kg/m. Usual Weight: 104kg  Assessment:  59 yo W recently admitted 05/14/25 for fistula now readmitted with worsening weakness, falls, lethargy, nausea, confusion and poor PO intake. PICC was placed outpatient 10/7 for TPN with first TPN to start 10/12. Plan for colectomy in 4-6 weeks after weaning off immunosuppresants. Poor PO intake since ~05/08/20 with documented weight loss of 14kg over 6 months. Patient is at risk for refeeding syndrome.   Glucose / Insulin: BG 82-113, used 1 unit insulin/24hr. On chronic prednisone 10mg . A1C 4.9, no diabetic meds PTA.  Electrolytes: all lytes WNL; CoCa 12.1 high  Renal: Scr 1.6, ClCr ~39 ml/min LFTs / TGs: LFT wnl (10/13), TG 120 (10/13) Prealbumin / albumin: 15.6 (10/12), 2.3 (10/13) Intake / Output; MIVF: UOP 0.6 ml/kg/hr GI Imaging: 9/21 CT Abd: Large stool burden, possible colitis/diverticulitis, highly suspicious colovesicular fistula  10/12 CT abd: L colonic diverticulitis, likely colovesical fistula and paracolonic abscess Surgeries / Procedures:  9/24 Flex sig: sigmoid stricture and diverticulosis   Central access: 10/7 PICC  TPN start date: 10/12   Nutritional Goals (pending RD recommendation) kCal: 1700-2300, Protein: 80-110, Fluid: ~2L/d Goal TPN rate is 80 mL/hr (provides 103 g of protein and 2093 kcals per day)  Current Nutrition:  TPN  Plan:  Advance TPN to 35mL/hr at 1800 (Will provide 77g AA, 230g Dextrose, 47g Lipid, 1570 kcal; meeting ~75% of estimated goals)  Electrolytes in TPN: Decrease to 36mEq/L of K; Continue 75 mEq/L of Na, 0 mEq/L of Ca, 72mEq/L of Mg, and 11mmol/L of Phos; Cl:Ac ratio 1:1 Add standard MVI and trace elements to TPN Initiate Sensitive q4h SSI and adjust as needed   Monitor TPN labs daily until goal and then on Mon/Thurs   Benetta Spar, PharmD, BCPS, [place] Pharmacist  Please check AMION for all [place] phone numbers After 10:00 PM, call [place]

## 2020-06-05 NOTE — Progress Notes (Signed)
PROGRESS NOTE    Valerie [name]  JQB:[id number] DOB: [birth year] DOA: 06/03/2020 PCP: Otelia Santee I, MD   Brief Narrative: Valerie [name] is a 59 y.o. female with a history of diabetes mellitus, type II, CKD stage IIIb, peripheral neuropathy, psoriatic arthritis, obstructive sleep apnea, CAD, colovesical fistula.  Patient presented secondary to worsening weakness with concern for urinary tract infection.  She was started on empiric antibiotics with cultures obtained.   Assessment & Plan:   Active Problems:   Anxiety   Psoriatic arthritis (HCC)   OSA (obstructive sleep apnea)   Bipolar disorder (HCC)   DM type 2 (diabetes mellitus, type 2) (HCC)   Colovesical fistula   Weakness   CAD (coronary artery disease)   Macrocytic anemia   Malnutrition of moderate degree   Urinary tract infection Patient empirically started on Zosyn IV for treatment.  Urine culture significant for E. Coli with sensitivities available.  In setting of colovesical fistula.  -Discontinue Zosyn IV -Cefdinir 300 mg twice daily  Weakness Concern on admission was related to infection in setting of colovesical history of versus glucocorticosteroid withdrawal. -Physical therapy consulted  Colovesical fistula Patient followed by general surgery as an outpatient with plan for laparoscopic management. -General surgery recommendations: Following this admission  Psoriatic arthritis Patient has been managed with methotrexate, prednisone, Plaquenil, Enbrel which were held on previous discharge.  Patient's prednisone was decreased to 5 mg daily at discharge as well.  Prednisone was continued at 10 mg dose this admission.  Macrocytic anemia Currently baseline.  Folate and vitamin B12 levels ordered on admission and are normal.  Coronary artery disease Patient with history of NSTEMI.  Patient follows with cardiology as outpatient. -Continue aspirin, nadolol, Crestor  Memory impairment Patient is aware of her  memory deficits.  Unsure if this is related to dementia versus other cognitive impairment.  Patient is currently on donepezil as an outpatient -Continue donepezil  Diabetes mellitus, type II Patient is not on medication therapy as an outpatient.  Complicated by prednisone use.  Last hemoglobin A1c of 4.9% obtained this admission.  Patient is currently now on TPN. -Continue sliding scale insulin  CKD stage IIIb Baseline creatinine of 1.6-1.8.  Patient currently at baseline.  Moderate malnutrition Dietitian consulted.  Patient started on TPN per outpatient plan by PCP.  Patient presented with [facility] line in place.   DVT prophylaxis: Lovenox Code Status:   Code Status: Full Code Family Communication: Daughter on telephone in patient's room Disposition Plan: Discharge likely home in 1-3 days pending blood culture results in addition to PT/general surgery recommendations.   Consultants:   General surgery  Procedures:   None  Antimicrobials:  Zosyn IV    Subjective: Some nausea after eating breakfast but otherwise feels well.  Objective: Vitals:   06/04/20 2058 06/04/20 2132 06/05/20 0244 06/05/20 0511  BP: 136/73  117/72 110/62  Pulse: (!) 58 65 (!) 55 62  Resp: 16 18 15 15   Temp: 98.4 F (36.9 C)  97.6 F (36.4 C) 98.3 F (36.8 C)  TempSrc:    Oral  SpO2: 95% 96% 96% 94%  Weight:      Height:        Intake/Output Summary (Last 24 hours) at 06/05/2020 1041 Last data filed at 06/05/2020 0700 Gross per 24 hour  Intake 736.14 ml  Output 1350 ml  Net -613.86 ml   Filed Weights   06/03/20 1315 06/04/20 0342  Weight: 90.3 kg 90.3 kg    Examination:  General exam:  Appears calm and comfortable Respiratory system: Clear to auscultation. Respiratory effort normal. Cardiovascular system: S1 & S2 heard, RRR. Gastrointestinal system: Abdomen is nondistended, soft and nontender. No organomegaly or masses felt. Normal bowel sounds heard. Central nervous system: Alert  and oriented. No focal neurological deficits. Musculoskeletal: No edema. No calf tenderness Skin: No cyanosis. No rashes Psychiatry: Judgement and insight appear normal. Mood & affect appropriate.     Data Reviewed: I have personally reviewed following labs and imaging studies  CBC Lab Results  Component Value Date   WBC 10.2 06/05/2020   RBC 2.90 (L) 06/05/2020   HGB 9.7 (L) 06/05/2020   HCT 31.7 (L) 06/05/2020   MCV 109.3 (H) 06/05/2020   MCH 33.4 06/05/2020   PLT 160 06/05/2020   MCHC 30.6 06/05/2020   RDW 14.6 06/05/2020   LYMPHSABS 1.0 06/05/2020   MONOABS 0.8 06/05/2020   EOSABS 0.1 06/05/2020   BASOSABS 0.0 34/74/2595     Last metabolic panel Lab Results  Component Value Date   NA 142 06/05/2020   K 4.2 06/05/2020   CL 106 06/05/2020   CO2 32 06/05/2020   BUN 15 06/05/2020   CREATININE 1.60 (H) 06/05/2020   GLUCOSE 110 (H) 06/05/2020   GFRNONAA 35 (L) 06/05/2020   GFRAA 38 (L) 05/15/2020   CALCIUM 10.8 (H) 06/05/2020   PHOS 2.6 06/05/2020   PROT 4.9 (L) 06/05/2020   ALBUMIN 2.3 (L) 06/05/2020   BILITOT 0.5 06/05/2020   ALKPHOS 75 06/05/2020   AST 13 (L) 06/05/2020   ALT 15 06/05/2020   ANIONGAP 4 (L) 06/05/2020    CBG (last 3)  Recent Labs    06/05/20 0007 06/05/20 0445 06/05/20 0747  GLUCAP 123* 113* 122*     GFR: Estimated Creatinine Clearance: 39.1 mL/min (A) (by C-G formula based on SCr of 1.6 mg/dL (H)).  Coagulation Profile: No results for input(s): INR, PROTIME in the last 168 hours.  Recent Results (from the past 240 hour(s))  Respiratory Panel by RT PCR (Flu A&B, Covid) - Nasopharyngeal Swab     Status: None   Collection Time: 06/04/20  5:06 AM   Specimen: Nasopharyngeal Swab  Result Value Ref Range Status   SARS Coronavirus 2 by RT PCR NEGATIVE NEGATIVE Final    Comment: (NOTE) SARS-CoV-2 target nucleic acids are NOT DETECTED.  The SARS-CoV-2 RNA is generally detectable in upper respiratoy specimens during the acute phase of  infection. The lowest concentration of SARS-CoV-2 viral copies this assay can detect is 131 copies/mL. A negative result does not preclude SARS-Cov-2 infection and should not be used as the sole basis for treatment or other patient management decisions. A negative result may occur with  improper specimen collection/handling, submission of specimen other than nasopharyngeal swab, presence of viral mutation(s) within the areas targeted by this assay, and inadequate number of viral copies (<131 copies/mL). A negative result must be combined with clinical observations, patient history, and epidemiological information. The expected result is Negative.  Fact Sheet for Patients:  PinkCheek.be  Fact Sheet for Healthcare Providers:  GravelBags.it  This test is no t yet approved or cleared by the [place] FDA and  has been authorized for detection and/or diagnosis of SARS-CoV-2 by FDA under an Emergency Use Authorization (EUA). This EUA will remain  in effect (meaning this test can be used) for the duration of the COVID-19 declaration under Section 564(b)(1) of the Act, 21 U.S.C. section 360bbb-3(b)(1), unless the authorization is terminated or revoked sooner.  Influenza A by PCR NEGATIVE NEGATIVE Final   Influenza B by PCR NEGATIVE NEGATIVE Final    Comment: (NOTE) The Xpert Xpress SARS-CoV-2/FLU/RSV assay is intended as an aid in  the diagnosis of influenza from Nasopharyngeal swab specimens and  should not be used as a sole basis for treatment. Nasal washings and  aspirates are unacceptable for Xpert Xpress SARS-CoV-2/FLU/RSV  testing.  Fact Sheet for Patients: PinkCheek.be  Fact Sheet for Healthcare Providers: GravelBags.it  This test is not yet approved or cleared by the [place] FDA and  has been authorized for detection and/or diagnosis of SARS-CoV-2  by  FDA under an Emergency Use Authorization (EUA). This EUA will remain  in effect (meaning this test can be used) for the duration of the  Covid-19 declaration under Section 564(b)(1) of the Act, 21  U.S.C. section 360bbb-3(b)(1), unless the authorization is  terminated or revoked. Performed at [facility], [place] [address], [place], [place] [address]   Urine culture     Status: Abnormal   Collection Time: 06/04/20  6:08 AM   Specimen: Urine, Random  Result Value Ref Range Status   Specimen Description URINE, RANDOM  Final   Special Requests   Final    NONE Performed at [facility], [place] [address] [place], [place] [address]    Culture >=100,000 COLONIES/mL ESCHERICHIA COLI (A)  Final   Report Status 06/05/2020 FINAL  Final   Organism ID, Bacteria ESCHERICHIA COLI (A)  Final      Susceptibility   Escherichia coli - MIC*    AMPICILLIN >=32 RESISTANT Resistant     CEFAZOLIN 16 SENSITIVE Sensitive     CEFTRIAXONE <=0.25 SENSITIVE Sensitive     CIPROFLOXACIN 1 SENSITIVE Sensitive     GENTAMICIN <=1 SENSITIVE Sensitive     IMIPENEM <=0.25 SENSITIVE Sensitive     NITROFURANTOIN <=16 SENSITIVE Sensitive     TRIMETH/SULFA <=20 SENSITIVE Sensitive     AMPICILLIN/SULBACTAM >=32 RESISTANT Resistant     PIP/TAZO <=4 SENSITIVE Sensitive     * >=100,000 COLONIES/mL ESCHERICHIA COLI  Blood culture (routine x 2)     Status: None (Preliminary result)   Collection Time: 06/04/20  8:23 AM   Specimen: BLOOD  Result Value Ref Range Status   Specimen Description BLOOD RIGHT ANTECUBITAL  Final   Special Requests   Final    BOTTLES DRAWN AEROBIC ONLY Blood Culture results may not be optimal due to an inadequate volume of blood received in culture bottles   Culture   Final    NO GROWTH < 24 HOURS Performed at [facility], [address], [place], [place] [address]    Report Status PENDING  Incomplete  Blood culture (routine x 2)     Status: None (Preliminary result)    Collection Time: 06/04/20  8:59 AM   Specimen: BLOOD LEFT HAND  Result Value Ref Range Status   Specimen Description BLOOD LEFT HAND  Final   Special Requests   Final    BOTTLES DRAWN AEROBIC ONLY Blood Culture results may not be optimal due to an inadequate volume of blood received in culture bottles   Culture   Final    NO GROWTH < 24 HOURS Performed at [facility], [place] [address], [place], [place] [address]    Report Status PENDING  Incomplete        Radiology Studies: CT ABDOMEN PELVIS WO CONTRAST  Result Date: 06/04/2020 CLINICAL DATA:  Abdominal pain. EXAM:  CT ABDOMEN AND PELVIS WITHOUT CONTRAST TECHNIQUE: Multidetector CT imaging of the abdomen and pelvis was performed following the standard protocol without IV contrast. COMPARISON:  05/14/2020 FINDINGS: Lower chest: Atelectasis or scarring noted right middle lobe. Dependent atelectasis noted lung bases bilaterally. Hepatobiliary: No focal abnormality in the liver on this study without intravenous contrast. Gallbladder is surgically absent. No intrahepatic or extrahepatic biliary dilation. Pancreas: No focal mass lesion. No dilatation of the main duct. No intraparenchymal cyst. No peripancreatic edema. Spleen: Insert spleen Adrenals/Urinary Tract: No adrenal nodule or mass. No gross renal mass lesion evident on this noncontrast CT scan. No evidence for urinary stone disease. Mild fullness of the right intrarenal collecting system is new in the interval. No gross hydroureter. Wall thickening noted along the bladder dome is associated with a tiny gas bubble in the bladder lumen. Stomach/Bowel: Small hiatal hernia. Stomach is unremarkable. No gastric wall thickening. No evidence of outlet obstruction. Duodenum is normally positioned as is the ligament of Treitz. No small bowel wall thickening. No small bowel dilatation. The terminal ileum is normal. The appendix is not visualized, but there is no edema or inflammation in the region of  the cecum. There is left colonic diverticulosis with subtle pericolonic edema/inflammation associated with the proximal sigmoid segment. 2 soft tissue tracks communicate between the proximal to mid sigmoid colon in the dome of bladder 1 of which goes from the anterior wall of the sigmoid colon to the anterior bladder dome and is best demonstrated on sagittal images 80-89. A second confluent area of soft tissue extends from the inferior wall of the sigmoid colon to the mid bladder no (sagittal 78/7) with gas and debris between the colon in the dome of bladder. Either one or both areas could represent colovesical fistula. The collection of debris and gas wedged between the inferior wall the sigmoid colon and mid bladder dome suggests para colonic abscess and this may be intramural in the bladder wall. Vascular/Lymphatic: No abdominal aortic aneurysm. There is no gastrohepatic or hepatoduodenal ligament lymphadenopathy. No retroperitoneal or mesenteric lymphadenopathy. No pelvic sidewall lymphadenopathy. Reproductive: Inferior pelvis obscured by beam hardening artifact from bilateral hip replacement, but no adnexal mass evident. Other: No intraperitoneal free fluid. Musculoskeletal: Status post bilateral hip replacement. No worrisome lytic or sclerotic osseous abnormality. T9 compression deformity again noted. IMPRESSION: 1. Left colonic diverticulosis with subtle pericolonic edema/inflammation associated with the proximal sigmoid segment. Imaging features are compatible with diverticulitis. 2. 2 different tracks of soft tissue between the proximal to mid sigmoid colon and the bladder dome. Either one or both areas could represent colovesical fistula. The collection of debris and gas wedged between the inferior wall the sigmoid colon and mid bladder dome suggests para colonic abscess and this may be intramural in the bladder wall. Overall imaging features are similar to 05/14/2020. 3. Mild fullness of the right  intrarenal collecting system without gross hydroureter. This finding is new since 05/14/2020. 4. Small hiatal hernia. 5. Status post bilateral hip replacement. Electronically Signed   By: Misty Stanley M.D.   On: 06/04/2020 07:24   DG Tibia/Fibula Right  Result Date: 06/04/2020 CLINICAL DATA:  Pain after a fall. EXAM: RIGHT TIBIA AND FIBULA - 2 VIEW COMPARISON:  None. FINDINGS: There is no evidence of fracture or other focal bone lesions. Soft tissues are unremarkable. IMPRESSION: Negative. Electronically Signed   By: Misty Stanley M.D.   On: 06/04/2020 05:17   CT Head Wo Contrast  Result Date: 06/03/2020 CLINICAL DATA:  Head trauma,  coagulopathy.  Weakness. EXAM: CT HEAD WITHOUT CONTRAST TECHNIQUE: Contiguous axial images were obtained from the base of the skull through the vertex without intravenous contrast. COMPARISON:  CT head 02/20/2020 FINDINGS: Brain: No evidence of acute large vascular territory infarction, hemorrhage, hydrocephalus, extra-axial collection or mass lesion/mass effect. Redemonstrated partially empty sella. Vascular: No hyperdense vessel identified. Skull: No acute fracture. Sinuses/Orbits: Partially imaged right maxillary sinus retention cyst. Otherwise, the sinuses are clear. No acute orbital abnormality. Other: No mastoid effusions IMPRESSION: 1. No evidence of acute intracranial abnormality. 2. Redemonstrated partially empty sella, which may be an incidental finding but can be seen with idiopathic intracranial hypertension in the correct clinical setting. Electronically Signed   By: Margaretha Sheffield MD   On: 06/03/2020 14:57   DG Knee Complete 4 Views Right  Result Date: 06/04/2020 CLINICAL DATA:  Pain after a fall. EXAM: RIGHT KNEE - COMPLETE 4+ VIEW COMPARISON:  None. FINDINGS: No fracture. No subluxation or dislocation. Marked loss of joint space noted medial compartment. Hypertrophic spurring evident in all 3 compartments with probable small joint effusion in the  suprapatellar bursa. IMPRESSION: Tricompartmental degenerative changes without acute bony abnormality. Electronically Signed   By: Misty Stanley M.D.   On: 06/04/2020 05:16        Scheduled Meds: . Chlorhexidine Gluconate Cloth  6 each Topical Daily  . feeding supplement (ENSURE ENLIVE)  237 mL Oral BID BM  . insulin aspart  0-9 Units Subcutaneous Q4H  . nystatin   Topical TID  . predniSONE  10 mg Oral Q breakfast  . senna-docusate  1 tablet Oral BID  . sodium chloride flush  10-40 mL Intracatheter Q12H   Continuous Infusions: . piperacillin-tazobactam (ZOSYN)  IV 3.375 g (06/05/20 0912)  . TPN ADULT (ION) 40 mL/hr at 06/05/20 0700     LOS: 1 day     Cordelia Poche, MD [facility] 06/05/2020, 10:41 AM  If 7PM-7AM, please contact night-coverage [email or web address]

## 2020-06-05 NOTE — Plan of Care (Signed)
  Problem: Education: Goal: Knowledge of General Education information will improve Description: Including pain rating scale, medication(s)/side effects and non-pharmacologic comfort measures 06/05/2020 0724 by Bethann Punches, RN Outcome: Progressing 06/04/2020 1912 by Bethann Punches, RN Outcome: Progressing   Problem: Health Behavior/Discharge Planning: Goal: Ability to manage health-related needs will improve 06/05/2020 0724 by Bethann Punches, RN Outcome: Progressing 06/04/2020 1912 by Bethann Punches, RN Outcome: Progressing   Problem: Clinical Measurements: Goal: Ability to maintain clinical measurements within normal limits will improve 06/05/2020 0724 by Bethann Punches, RN Outcome: Progressing 06/04/2020 1912 by Bethann Punches, RN Outcome: Progressing Goal: Will remain free from infection 06/05/2020 0724 by Bethann Punches, RN Outcome: Progressing 06/04/2020 1912 by Bethann Punches, RN Outcome: Progressing Goal: Diagnostic test results will improve 06/05/2020 0724 by Bethann Punches, RN Outcome: Progressing 06/04/2020 1912 by Bethann Punches, RN Outcome: Progressing Goal: Respiratory complications will improve 06/05/2020 0724 by Bethann Punches, RN Outcome: Progressing 06/04/2020 1912 by Bethann Punches, RN Outcome: Progressing Goal: Cardiovascular complication will be avoided 06/05/2020 0724 by Bethann Punches, RN Outcome: Progressing 06/04/2020 1912 by Bethann Punches, RN Outcome: Progressing   Problem: Activity: Goal: Risk for activity intolerance will decrease 06/05/2020 0724 by Bethann Punches, RN Outcome: Progressing 06/04/2020 1912 by Bethann Punches, RN Outcome: Progressing   Problem: Nutrition: Goal: Adequate nutrition will be maintained 06/05/2020 0724 by Bethann Punches, RN Outcome: Progressing 06/04/2020 1912 by Bethann Punches, RN Outcome: Progressing   Problem: Coping: Goal: Level of anxiety will decrease 06/05/2020 0724 by Bethann Punches, RN Outcome: Progressing 06/04/2020 1912 by Bethann Punches, RN Outcome: Progressing   Problem: Elimination: Goal: Will not experience complications related to bowel motility 06/05/2020 0724 by Bethann Punches, RN Outcome: Progressing 06/04/2020 1912 by Bethann Punches, RN Outcome: Progressing Goal: Will not experience complications related to urinary retention 06/05/2020 0724 by Bethann Punches, RN Outcome: Progressing 06/04/2020 1912 by Bethann Punches, RN Outcome: Progressing   Problem: Pain Managment: Goal: General experience of comfort will improve 06/05/2020 0724 by Bethann Punches, RN Outcome: Progressing 06/04/2020 1912 by Bethann Punches, RN Outcome: Progressing   Problem: Safety: Goal: Ability to remain free from injury will improve 06/05/2020 0724 by Bethann Punches, RN Outcome: Progressing 06/04/2020 1912 by Bethann Punches, RN Outcome: Progressing   Problem: Skin Integrity: Goal: Risk for impaired skin integrity will decrease 06/05/2020 0724 by Bethann Punches, RN Outcome: Progressing 06/04/2020 1912 by Bethann Punches, RN Outcome: Progressing

## 2020-06-05 NOTE — Plan of Care (Signed)
  Problem: Education: Goal: Knowledge of General Education information will improve Description: Including pain rating scale, medication(s)/side effects and non-pharmacologic comfort measures 06/05/2020 1602 by Bethann Punches, RN Outcome: Progressing 06/05/2020 0724 by Bethann Punches, RN Outcome: Progressing   Problem: Health Behavior/Discharge Planning: Goal: Ability to manage health-related needs will improve 06/05/2020 1602 by Bethann Punches, RN Outcome: Progressing 06/05/2020 0724 by Bethann Punches, RN Outcome: Progressing   Problem: Clinical Measurements: Goal: Ability to maintain clinical measurements within normal limits will improve 06/05/2020 1602 by Bethann Punches, RN Outcome: Progressing 06/05/2020 0724 by Bethann Punches, RN Outcome: Progressing Goal: Will remain free from infection 06/05/2020 1602 by Bethann Punches, RN Outcome: Progressing 06/05/2020 0724 by Bethann Punches, RN Outcome: Progressing Goal: Diagnostic test results will improve 06/05/2020 1602 by Bethann Punches, RN Outcome: Progressing 06/05/2020 0724 by Bethann Punches, RN Outcome: Progressing Goal: Respiratory complications will improve 06/05/2020 1602 by Bethann Punches, RN Outcome: Progressing 06/05/2020 0724 by Bethann Punches, RN Outcome: Progressing Goal: Cardiovascular complication will be avoided 06/05/2020 1602 by Bethann Punches, RN Outcome: Progressing 06/05/2020 0724 by Bethann Punches, RN Outcome: Progressing   Problem: Activity: Goal: Risk for activity intolerance will decrease 06/05/2020 1602 by Bethann Punches, RN Outcome: Progressing 06/05/2020 0724 by Bethann Punches, RN Outcome: Progressing   Problem: Nutrition: Goal: Adequate nutrition will be maintained 06/05/2020 1602 by Bethann Punches, RN Outcome: Progressing 06/05/2020 0724 by Bethann Punches, RN Outcome: Progressing   Problem: Coping: Goal: Level of anxiety will decrease 06/05/2020 1602 by Bethann Punches, RN Outcome: Progressing 06/05/2020 0724 by Bethann Punches, RN Outcome: Progressing   Problem: Elimination: Goal: Will not experience complications related to bowel motility 06/05/2020 1602 by Bethann Punches, RN Outcome: Progressing 06/05/2020 0724 by Bethann Punches, RN Outcome: Progressing Goal: Will not experience complications related to urinary retention 06/05/2020 1602 by Bethann Punches, RN Outcome: Progressing 06/05/2020 0724 by Bethann Punches, RN Outcome: Progressing   Problem: Pain Managment: Goal: General experience of comfort will improve 06/05/2020 1602 by Bethann Punches, RN Outcome: Progressing 06/05/2020 0724 by Bethann Punches, RN Outcome: Progressing   Problem: Safety: Goal: Ability to remain free from injury will improve 06/05/2020 1602 by Bethann Punches, RN Outcome: Progressing 06/05/2020 0724 by Bethann Punches, RN Outcome: Progressing   Problem: Skin Integrity: Goal: Risk for impaired skin integrity will decrease 06/05/2020 1602 by Bethann Punches, RN Outcome: Progressing 06/05/2020 0724 by Bethann Punches, RN Outcome: Progressing

## 2020-06-06 ENCOUNTER — Ambulatory Visit: Payer: Medicare Other | Admitting: Internal Medicine

## 2020-06-06 DIAGNOSIS — N321 Vesicointestinal fistula: Secondary | ICD-10-CM | POA: Diagnosis not present

## 2020-06-06 DIAGNOSIS — E440 Moderate protein-calorie malnutrition: Secondary | ICD-10-CM | POA: Diagnosis not present

## 2020-06-06 DIAGNOSIS — E1140 Type 2 diabetes mellitus with diabetic neuropathy, unspecified: Secondary | ICD-10-CM | POA: Diagnosis not present

## 2020-06-06 DIAGNOSIS — N390 Urinary tract infection, site not specified: Secondary | ICD-10-CM | POA: Diagnosis not present

## 2020-06-06 LAB — GLUCOSE, CAPILLARY
Glucose-Capillary: 113 mg/dL — ABNORMAL HIGH (ref 70–99)
Glucose-Capillary: 109 mg/dL — ABNORMAL HIGH (ref 70–99)
Glucose-Capillary: 165 mg/dL — ABNORMAL HIGH (ref 70–99)
Glucose-Capillary: 148 mg/dL — ABNORMAL HIGH (ref 70–99)
Glucose-Capillary: 118 mg/dL — ABNORMAL HIGH (ref 70–99)

## 2020-06-06 LAB — CBC WITH DIFFERENTIAL/PLATELET
Abs Immature Granulocytes: 0.04 10*3/uL (ref 0.00–0.07)
Basophils Absolute: 0 10*3/uL (ref 0.0–0.1)
Basophils Relative: 1 %
Eosinophils Absolute: 0.1 10*3/uL (ref 0.0–0.5)
Eosinophils Relative: 2 %
HCT: 30.3 % — ABNORMAL LOW (ref 36.0–46.0)
Hemoglobin: 9.1 g/dL — ABNORMAL LOW (ref 12.0–15.0)
Immature Granulocytes: 1 %
Lymphocytes Relative: 15 %
Lymphs Abs: 1.2 10*3/uL (ref 0.7–4.0)
MCH: 33.1 pg (ref 26.0–34.0)
MCHC: 30 g/dL (ref 30.0–36.0)
MCV: 110.2 fL — ABNORMAL HIGH (ref 80.0–100.0)
Monocytes Absolute: 0.7 10*3/uL (ref 0.1–1.0)
Monocytes Relative: 9 %
Neutro Abs: 6.1 10*3/uL (ref 1.7–7.7)
Neutrophils Relative %: 72 %
Platelets: 155 10*3/uL (ref 150–400)
RBC: 2.75 MIL/uL — ABNORMAL LOW (ref 3.87–5.11)
RDW: 14.4 % (ref 11.5–15.5)
WBC: 8.2 10*3/uL (ref 4.0–10.5)
nRBC: 0 % (ref 0.0–0.2)

## 2020-06-06 LAB — COMPREHENSIVE METABOLIC PANEL
ALT: 12 U/L (ref 0–44)
AST: 13 U/L — ABNORMAL LOW (ref 15–41)
Albumin: 2.1 g/dL — ABNORMAL LOW (ref 3.5–5.0)
Alkaline Phosphatase: 69 U/L (ref 38–126)
Anion gap: 3 — ABNORMAL LOW (ref 5–15)
BUN: 17 mg/dL (ref 6–20)
CO2: 31 mmol/L (ref 22–32)
Calcium: 10.5 mg/dL — ABNORMAL HIGH (ref 8.9–10.3)
Chloride: 106 mmol/L (ref 98–111)
Creatinine, Ser: 1.45 mg/dL — ABNORMAL HIGH (ref 0.44–1.00)
GFR, Estimated: 39 mL/min — ABNORMAL LOW (ref >60)
Glucose, Bld: 123 mg/dL — ABNORMAL HIGH (ref 70–99)
Potassium: 4 mmol/L (ref 3.5–5.1)
Sodium: 140 mmol/L (ref 135–145)
Total Bilirubin: 0.2 mg/dL — ABNORMAL LOW (ref 0.3–1.2)
Total Protein: 4.7 g/dL — ABNORMAL LOW (ref 6.5–8.1)

## 2020-06-06 LAB — MAGNESIUM: Magnesium: 2.1 mg/dL (ref 1.7–2.4)

## 2020-06-06 LAB — PHOSPHORUS: Phosphorus: 2.2 mg/dL — ABNORMAL LOW (ref 2.5–4.6)

## 2020-06-06 MED ORDER — INSULIN ASPART 100 UNIT/ML ~~LOC~~ SOLN
0.0000 [IU] | Freq: Two times a day (BID) | SUBCUTANEOUS | Status: DC
  Administered 2020-06-07: 1 [IU] via SUBCUTANEOUS

## 2020-06-06 MED ORDER — TRAVASOL 10 % IV SOLN
INTRAVENOUS | Status: AC
  Filled 2020-06-06: qty 1152

## 2020-06-06 NOTE — Care Management (Addendum)
Per chart patient from home with [facility] and [facility]. [name] cell [phone]  Left message for Amy with [facility], and spoke to [name] with [facility]. [name] updated on plan. If patient does discharge to home [facility] cannot make new TPN on weekends so discharge would have to be a weekday.   Will follow up with both , patient and family after morning meeting.  Magdalen Spatz RN

## 2020-06-06 NOTE — Progress Notes (Addendum)
CC: Multiple falls secondary to generalized weakness, chronic nausea, chronic abdominal discomfort, unable to eat, and intermittent confusion  Subjective: She looks much better than she did 2 days ago.  She is taking some p.o. she has 820 recorded.  She is asking for regular diet so she can get vegetables.  She is not complaining of nausea, and asking if she can get up and get around some more.  She also wanted to know when she could go home.  Objective: Vital signs in last 24 hours: Temp:  [98.3 F (36.8 C)-98.6 F (37 C)] 98.3 F (36.8 C) (10/14 0420) Pulse Rate:  [62-73] 63 (10/14 0420) Resp:  [16-17] 16 (10/14 0420) BP: (104-120)/(57-69) 111/64 (10/14 0420) SpO2:  [93 %-95 %] 95 % (10/14 0420) Last BM Date: 06/05/20 820 p.o. 700 IV 2300 urine BM x1 Afebrile vital signs are stable CMP is stable this a.m. WBC 13.1 >>8.2 Culture >=100,000 COLONIES/mL ESCHERICHIA COLIAbnormal   Report Status 06/05/2020 FINAL   Organism ID, Bacteria ESCHERICHIA COLIAbnormal   Resulting Agency [facility]  Susceptibility   Escherichia coli    MIC    AMPICILLIN >=32 RESIST... Resistant    AMPICILLIN/SULBACTAM >=32 RESIST... Resistant    CEFAZOLIN 16 SENSITIVE  Sensitive    CEFTRIAXONE <=0.25 SENS... Sensitive    CIPROFLOXACIN 1 SENSITIVE  Sensitive    GENTAMICIN <=1 SENSITIVE  Sensitive    IMIPENEM <=0.25 SENS... Sensitive    NITROFURANTOIN <=16 SENSIT... Sensitive    PIP/TAZO <=4 SENSITIVE  Sensitive    TRIMETH/SULFA <=20 SENSIT... Sensitive       Intake/Output from previous day: 10/13 0701 - 10/14 0700 In: 1557.3 [P.O.:820; I.V.:637.3; IV Piggyback:100] Out: 2300 [Urine:2300] Intake/Output this shift: No intake/output data recorded.  General appearance: alert, cooperative and no distress Resp: clear to auscultation bilaterally GI: soft, non-tender; bowel sounds normal; no masses,  no organomegaly  Lab Results:  Recent Labs    06/05/20 0356 06/06/20 0409  WBC 10.2  8.2  HGB 9.7* 9.1*  HCT 31.7* 30.3*  PLT 160 155    BMET Recent Labs    06/05/20 0356 06/06/20 0409  NA 142 140  K 4.2 4.0  CL 106 106  CO2 32 31  GLUCOSE 110* 123*  BUN 15 17  CREATININE 1.60* 1.45*  CALCIUM 10.8* 10.5*   PT/INR No results for input(s): LABPROT, INR in the last 72 hours.  Recent Labs  Lab 06/05/20 0356 06/06/20 0409  AST 13* 13*  ALT 15 12  ALKPHOS 75 69  BILITOT 0.5 0.2*  PROT 4.9* 4.7*  ALBUMIN 2.3* 2.1*     Lipase     Component Value Date/Time   LIPASE 26 05/14/2020 1514     Medications: . aspirin EC  81 mg Oral Daily  . cefdinir  300 mg Oral Q12H  . Chlorhexidine Gluconate Cloth  6 each Topical Daily  . donepezil  10 mg Oral Daily  . enoxaparin (LOVENOX) injection  40 mg Subcutaneous Q24H  . escitalopram  30 mg Oral Daily  . insulin aspart  0-9 Units Subcutaneous Q12H  . lamoTRIgine  150 mg Oral QHS  . lamoTRIgine  75 mg Oral Daily  . nadolol  10 mg Oral Daily  . nystatin   Topical TID  . predniSONE  10 mg Oral Q breakfast  . rosuvastatin  5 mg Oral Daily  . senna-docusate  1 tablet Oral BID  . sodium chloride flush  10-40 mL Intracatheter Q12H  . traZODone  200  mg Oral QHS   Anti-infectives (From admission, onward)   Start     Dose/Rate Route Frequency Ordered Stop   06/06/20 1000  cefdinir (OMNICEF) capsule 300 mg        300 mg Oral Every 12 hours 06/05/20 1955     06/04/20 1830  piperacillin-tazobactam (ZOSYN) IVPB 3.375 g  Status:  Discontinued        3.375 g 12.5 mL/hr over 240 Minutes Intravenous Every 8 hours 06/04/20 1806 06/05/20 1955   06/04/20 0430  piperacillin-tazobactam (ZOSYN) IVPB 3.375 g        3.375 g 100 mL/hr over 30 Minutes Intravenous  Once 06/04/20 0422 06/04/20 0538      Assessment/Plan Psoriatic arthritis-immunosuppressed; prednisone,methotrexate, Plaquenil and Enbrel, which are all being held by PCP per plan discussed at [facility] last admission, although prednisone has been reordered here. She was on  1/2 dose prednisone at home. Hx CAD/MI-10/2019 Hx CVA-on Plavix, but currently on hold in case she were to need something this admission Stage III kidney disease Multiple falls/severe deconditioning - needs PT/OT eval Moderate malnutrition-prealbumin 15.  On TNA, shakes, and soft diet Failure to thrive Ongoing tobacco use-42 years Hxof anxiety and depression UTI - urine cx with >100,000 colonies of gram negative, c/w last urine cx at [facility] c/w colovesical fistula  Colovesicular fistula with sigmoid diverticulitis -diverticulitis is improved on this set of imaging from her admission at [facility]. -cont abx therapy for UTI currently -agree with TNA to assist with nutrition here and as an outpatient -if she improves and is able to get discharged, would ideally like to keep plan to stay off of immunosuppressants to try for a 1 stage procedure by Dr. Johney [name].  However, if she is unable to improve well enough to get back home, may need [facility] while here.  FEN - soft diet, TNA 10/12>> day 3, ensure VTE - Lovenox; hold plavix ID - zosyn 10/12-10/13: Cefdinir 10/14>> day 1 Follow up: Dr. Johney [name]   Plan: She seems to be doing much better today.  She is asking for boost instead of Ensure.  She is also asking about increasing her diet, she denies any abdominal pain except with bowel movements.  Continue current plans and treatments.       LOS: 2 days    Valerie [name] 06/06/2020 Please see Amion

## 2020-06-06 NOTE — Progress Notes (Signed)
PHARMACY - TOTAL PARENTERAL NUTRITION CONSULT NOTE   Indication: Colovesicular Fistula  Patient Measurements: Height: 5\' 2"  (157.5 cm) Weight: 90.3 kg (199 lb) IBW/kg (Calculated) : 50.1 TPN AdjBW (KG): 60.1 Body mass index is 36.4 kg/m. Usual Weight: 104kg  Assessment:  59 yo W recently admitted 05/14/25 for fistula now readmitted with worsening weakness, falls, lethargy, nausea, confusion and poor PO intake. PICC was placed outpatient 10/7 for TPN with first TPN to start 10/12. Plan for colectomy in 4-6 weeks after weaning off immunosuppresants. Poor PO intake since ~05/08/20 with documented weight loss of ~14% over 3-6 months. Patient is at risk for refeeding syndrome.   Glucose / Insulin: BG <120, used 5 units insulin/24hr. On chronic prednisone 10mg . A1C 4.9, no diabetic meds PTA.  Electrolytes: Phos 2.2 low, CoCa 12 high; Na/K/Mg WNL Renal: Scr 1.45, ClCr 43 ml/min LFTs / TGs: LFT wnl, TG 120 (10/12) Prealbumin / albumin: 15.6 (10/12), 2.1 (10/14) Intake / Output; MIVF: UOP 1.1 ml/kg/hr, LBM 10/13 GI Imaging:  9/21 CT Abd: Large stool burden, possible colitis/diverticulitis, highly suspicious colovesicular fistula  10/12 CT abd: L colonic diverticulitis, likely colovesical fistula and paracolonic abscess Surgeries / Procedures:  9/24 Flex sig: sigmoid stricture and diverticulosis   Central access: 10/7 PICC  TPN start date: 10/12   Nutritional Goals (per RD recommendation 10/13) kCal: 1800-2000, Protein: 110-125, Fluid: > 1.8 L/d Goal TPN rate is 80 mL/hr (provides 115g of protein and 1970 kcals per day)   Current Nutrition:  TPN, soft diet   Plan:  Advance TPN to goal 41mL/hr at 1800 (Will provide 115gAA, 269g Dextrose, 60g Lipid, 1970 kcal; meeting 100% of estimated goals)  Electrolytes in TPN: Increase to 17 mmol/L of Phos; Decrease to 28 mEq/L of K; Continue 75 mEq/L of Na, 0 mEq/L of Ca, 72mEq/L of Mg; Cl:Ac ratio 1:1 Add standard MVI and trace elements to  TPN Decrease Sensitive SSI to q12h and discontinue if BG remains controlled with low use Monitor TPN labs 10/15 and then on Mon/Thurs  Cycle TPN when stable at goal   Benetta Spar, PharmD, BCPS, [place] Pharmacist  Please check AMION for all [place] phone numbers After 10:00 PM, call [place]

## 2020-06-06 NOTE — TOC Progression Note (Signed)
Transition of Care (TOC) - Progression Note    Patient Details  Name: Valerie [name] MRN: [id number] Date of Birth: 01/23/1961  Transition of Care (TOC) CM/SW Contact   P , LCSW Phone Number: 06/06/2020, 3:40 PM  Clinical Narrative:      Met with pt and sister bedside; daughter on speaker phone. Discussed snf process and potential for tpn for barrier. Pt and family want to pursue snf if possible. Possible facilities are [facility] and [facility]. CSW waiting for call back from [facility] after they accepted py in [facility].       Expected Discharge Plan and Services                                                 Social Determinants of Health (SDOH) Interventions    Readmission Risk Interventions No flowsheet data found.  

## 2020-06-06 NOTE — Evaluation (Signed)
Occupational Therapy Evaluation Patient Details Name: Valerie [name] MRN: [id number] DOB: 1961/07/08 Today's Date: 06/06/2020    History of Present Illness Pt is a 59 y/o female admitted secondary to progressive weakness and frequent falls. 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 CT abdomen/pelvis revealed left colonic diverticulosis with pericolonic edema/inflammation concerning for diverticulitis.  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 PMH includes bipolar disorder, CAD, DM, CKD, and psoriatic arthritis.    Clinical Impression   PTA pt living with family and requiring as needed assist for ADL. At time of eval, pt able to complete bed mobility at mod I level and sit <> stands with min guard assist and RW. Pt is reliant on UE support to maintain safe balance. She has a history of falls at home with knee buckling. She currently requires mod A to complete LB ADL and min A to complete toilet transfers. Some cognitive deficits in memory and problem solving were noted. Given current status, recommend SNF at d/c to continue to progress ADL independence safely. Will continue to follow per POC listed below.    Follow Up Recommendations  SNF    Equipment Recommendations  3 in 1 bedside commode    Recommendations for Other Services       Precautions / Restrictions Precautions Precautions: Fall Restrictions Weight Bearing Restrictions: No      Mobility Bed Mobility Overal bed mobility: Modified Independent                Transfers Overall transfer level: Needs assistance Equipment used: Rolling walker (2 wheeled) Transfers: Sit to/from Stand Sit to Stand: Min guard              Balance Overall balance assessment: Needs assistance Sitting-balance support: Feet unsupported;No upper extremity supported Sitting balance-Leahy Scale: Good     Standing balance support: Bilateral upper extremity supported;During  functional activity Standing balance-Leahy Scale: Poor Standing balance comment: reliant on external support                           ADL either performed or assessed with clinical judgement   ADL Overall ADL's : Needs assistance/impaired Eating/Feeding: Set up;Sitting   Grooming: Min guard;Standing   Upper Body Bathing: Minimal assistance;Sitting   Lower Body Bathing: Moderate assistance;Sit to/from stand;Sitting/lateral leans   Upper Body Dressing : Minimal assistance;Sitting   Lower Body Dressing: Moderate assistance;Sit to/from stand;Sitting/lateral leans   Toilet Transfer: Minimal assistance;Regular Toilet;Grab bars;RW   Toileting- Clothing Manipulation and Hygiene: Minimal assistance;Sit to/from stand;Sitting/lateral lean       Functional mobility during ADLs: Min guard;Rolling walker;Cueing for safety       Vision Baseline Vision/History: Wears glasses Wears Glasses: At all times Patient Visual Report: No change from baseline       Perception     Praxis      Pertinent Vitals/Pain Pain Assessment: No/denies pain     Hand Dominance     Extremity/Trunk Assessment Upper Extremity Assessment Upper Extremity Assessment: Generalized weakness   Lower Extremity Assessment Lower Extremity Assessment: Generalized weakness       Communication Communication Communication: No difficulties   Cognition Arousal/Alertness: Awake/alert Behavior During Therapy: WFL for tasks assessed/performed Overall Cognitive Status: Impaired/Different from baseline Area of Impairment: Memory;Problem solving                     Memory: Decreased short-term memory       Problem Solving: Requires verbal cues  General Comments: pt and family remember report a decreas in STM in the past few months. Pt requires VC's to problem solve multistep tasks   General Comments       Exercises     Shoulder Instructions      Home Living Family/patient expects to  be discharged to:: Private residence Living Arrangements: Non-relatives/Friends Available Help at Discharge: Family;Available 24 hours/day Type of Home: House Home Access: Stairs to enter [employer] of Steps: 1   Home Layout: Two level;Able to live on main level with bedroom/bathroom     Bathroom Shower/Tub: Other (comment) (has been sponge bathing)         Home Equipment: Walker - 2 wheels;Cane - single point;Wheelchair - manual;Bedside commode          Prior Functioning/Environment Level of Independence: Needs assistance  Gait / Transfers Assistance Needed: Has had to transition to RW in home. Per daughter, needs assist to walk. Uses WC outside of home.  ADL's / Homemaking Assistance Needed: Needs occasional assist for ADL tasks.            OT Problem List: Decreased strength;Decreased knowledge of use of DME or AE;Decreased activity tolerance;Cardiopulmonary status limiting activity;Impaired balance (sitting and/or standing);Decreased safety awareness      OT Treatment/Interventions: Self-care/ADL training;Therapeutic exercise;Patient/family education;Balance training;Energy conservation;Therapeutic activities;DME and/or AE instruction    OT Goals(Current goals can be found in the care plan section) Acute Rehab OT Goals Patient Stated Goal: to get stronger OT Goal Formulation: With patient Time For Goal Achievement: 06/20/20 Potential to Achieve Goals: Good  OT Frequency: Min 2X/week   Barriers to D/C:            Co-evaluation              AM-PAC OT "6 Clicks" Daily Activity     Outcome Measure Help from another person eating meals?: A Little Help from another person taking care of personal grooming?: A Little Help from another person toileting, which includes using toliet, bedpan, or urinal?: A Lot Help from another person bathing (including washing, rinsing, drying)?: A Lot Help from another person to put on and taking off regular upper  body clothing?: A Little Help from another person to put on and taking off regular lower body clothing?: A Lot 6 Click Score: 15   End of Session Equipment Utilized During Treatment: Rolling walker Nurse Communication: Mobility status  Activity Tolerance: Patient tolerated treatment well Patient left: in chair;with call bell/phone within reach  OT Visit Diagnosis: Unsteadiness on feet (R26.81);Other abnormalities of gait and mobility (R26.89);Muscle weakness (generalized) (M62.81)                Time: 8921-1941 OT Time Calculation (min): 27 min Charges:  OT General Charges $OT Visit: 1 Visit OT Evaluation $OT Eval Moderate Complexity: 1 Mod OT Treatments $Self Care/Home Management : 8-22 mins  Zenovia Jarred, MSOT, OTR/L Acute Rehabilitation Services [facility] Office Number: [phone] Pager: [phone]  Zenovia Jarred 06/06/2020, 5:28 PM

## 2020-06-06 NOTE — NC FL2 (Signed)
[place] MEDICAID FL2 LEVEL OF CARE SCREENING TOOL     IDENTIFICATION  Patient Name: Valerie [name] Birthdate: 27-Aug-1960 Sex: female Admission Date (Current Location): 06/03/2020  [facility] and [place] Number:  Herbalist and Address:  [facility] [place] [address] [place], [place] [address]      Provider Number: [id number]  Attending Physician Name and Address:  Mariel Aloe, MD  Relative Name and Phone Number:  Avital, Dancy (Daughter) [phone] [facility])    Current Level of Care: Hospital Recommended Level of Care: [place] Prior Approval Number:    Date Approved/Denied:   PASRR Number:    Discharge Plan: SNF    Current Diagnoses: Patient Active Problem List   Diagnosis Date Noted  . Malnutrition of moderate degree 06/05/2020  . Weakness 06/04/2020  . CAD (coronary artery disease) 06/04/2020  . Macrocytic anemia 06/04/2020  . 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 Intra-abdominal infection   . Acute colitis 05/14/2020  . NSTEMI (non-ST elevated myocardial infarction) ([place]) 11/14/2019  . DM type 2 (diabetes mellitus, type 2) ([place]) 11/14/2019  . Depression   . Anxiety   . Chest pain   . Obesity, Class III, BMI 40-49.9 (morbid obesity) ([place])   . CKD (chronic kidney disease), stage III ([place])   . Psoriatic arthritis ([place])   . Tobacco abuse   . OSA (obstructive sleep apnea)   . Neuropathy   . Bipolar disorder ([place])     Orientation RESPIRATION BLADDER Height & Weight     Self, Time, Situation, Place  Other (Comment) (roomair; cpap) External catheter Weight: 199 lb (90.3 kg) Height:  5\' 2"  (157.5 cm)  BEHAVIORAL SYMPTOMS/MOOD NEUROLOGICAL BOWEL NUTRITION STATUS   (none)  (none) Continent Diet (TPN Adult infusion)  AMBULATORY STATUS COMMUNICATION OF NEEDS Skin   Extensive Assist   Normal                       Personal Care Assistance Level of Assistance   Bathing, Feeding, Dressing Bathing Assistance: Limited assistance Feeding assistance: Limited assistance Dressing Assistance: Limited assistance     Functional Limitations Info  Sight, Hearing, Speech Sight Info: Adequate Hearing Info: Adequate Speech Info: Adequate    SPECIAL CARE FACTORS FREQUENCY  PT (By licensed PT), OT (By licensed OT), Restorative feeding program     PT Frequency: 5x week OT Frequency: 5x /week   Restorative Feeding Program Frequency: TPN        Contractures Contractures Info: Not present    Additional Factors Info  Code Status, Allergies Code Status Info: Full Allergies Info: Carbamazepine,Sertraline Hcl           Current Medications (06/06/2020):  This is the current hospital active medication list Current Facility-Administered Medications  Medication Dose Route Frequency Provider Last Rate Last Admin  . acetaminophen (TYLENOL) tablet 650 mg  650 mg Oral Q4H PRN Dwyane Dee, MD   650 mg at 06/05/20 0527  . aspirin EC tablet 81 mg  81 mg Oral Daily Mariel Aloe, MD   81 mg at 06/06/20 0956  . cefdinir (OMNICEF) capsule 300 mg  300 mg Oral Q12H Mariel Aloe, MD   300 mg at 06/06/20 0958  . Chlorhexidine Gluconate Cloth 2 % PADS 6 each  6 each Topical Daily Dwyane Dee, MD   6 each at 06/05/20 0913  . donepezil (ARICEPT) tablet 10 mg  10 mg Oral Daily Nettey,  Evalee Jefferson, MD   10 mg at 06/06/20 0958  . enoxaparin (LOVENOX) injection 40 mg  40 mg Subcutaneous Q24H Mariel Aloe, MD   40 mg at 06/05/20 2102  . escitalopram (LEXAPRO) tablet 30 mg  30 mg Oral Daily Mariel Aloe, MD   30 mg at 06/06/20 0957  . insulin aspart (novoLOG) injection 0-9 Units  0-9 Units Subcutaneous Q12H Donnamae Jude, [facility]      . lamoTRIgine (LAMICTAL) tablet 150 mg  150 mg Oral QHS Mariel Aloe, MD   150 mg at 06/05/20 2100  . lamoTRIgine (LAMICTAL) tablet 75 mg  75 mg Oral Daily Blenda Nicely, RPH   75 mg at 06/06/20 0957  . nadolol (CORGARD) tablet 10  mg  10 mg Oral Daily Mariel Aloe, MD   10 mg at 06/06/20 0958  . nystatin (MYCOSTATIN/NYSTOP) topical powder   Topical TID Dwyane Dee, MD   Given at 06/06/20 1005  . ondansetron (ZOFRAN) tablet 4 mg  4 mg Oral Q6H PRN Dwyane Dee, MD   4 mg at 06/05/20 1551   Or  . ondansetron (ZOFRAN) injection 4 mg  4 mg Intravenous Q6H PRN Dwyane Dee, MD   4 mg at 06/06/20 0959  . oxyCODONE (Oxy IR/ROXICODONE) immediate release tablet 5 mg  5 mg Oral Q6H PRN Dwyane Dee, MD   5 mg at 06/05/20 2100  . predniSONE (DELTASONE) tablet 10 mg  10 mg Oral Q breakfast Dwyane Dee, MD   10 mg at 06/06/20 0858  . rosuvastatin (CRESTOR) tablet 5 mg  5 mg Oral Daily Mariel Aloe, MD   5 mg at 06/06/20 0957  . senna-docusate (Senokot-S) tablet 1 tablet  1 tablet Oral BID Dwyane Dee, MD   1 tablet at 06/06/20 0957  . sodium chloride flush (NS) 0.9 % injection 10-40 mL  10-40 mL Intracatheter Q12H Dwyane Dee, MD   10 mL at 06/06/20 1005  . sodium chloride flush (NS) 0.9 % injection 10-40 mL  10-40 mL Intracatheter PRN Dwyane Dee, MD      . TPN ADULT (ION)   Intravenous Continuous TPN Donnamae Jude, RPH 60 mL/hr at 06/05/20 1730 New Bag at 06/05/20 1730  . TPN ADULT (ION)   Intravenous Continuous TPN Donnamae Jude, RPH      . traZODone (DESYREL) tablet 200 mg  200 mg Oral QHS Mariel Aloe, MD   200 mg at 06/05/20 2100     Discharge Medications: Please see discharge summary for a list of discharge medications.  Relevant Imaging Results:  Relevant Lab Results:   Additional Information SSN [place], [place]

## 2020-06-06 NOTE — Progress Notes (Signed)
PROGRESS NOTE    Valerie [name]  DGL:[id number] DOB: 02/12/1961 DOA: 06/03/2020 PCP: Otelia Santee I, MD   Brief Narrative: Valerie [name] is a 59 y.o. female with a history of diabetes mellitus, type II, CKD stage IIIb, peripheral neuropathy, psoriatic arthritis, obstructive sleep apnea, CAD, colovesical fistula.  Patient presented secondary to worsening weakness with concern for urinary tract infection.  She was started on empiric antibiotics with cultures obtained.   Assessment & Plan:   Active Problems:   Anxiety   Psoriatic arthritis (HCC)   OSA (obstructive sleep apnea)   Bipolar disorder (HCC)   DM type 2 (diabetes mellitus, type 2) (HCC)   Colovesical fistula   Weakness   CAD (coronary artery disease)   Macrocytic anemia   Malnutrition of moderate degree   Urinary tract infection Patient empirically started on Zosyn IV for treatment.  Urine culture significant for E. Coli with sensitivities available.  In setting of colovesical fistula. Discussed with ID and recommending 7 day total treatment course with refill available for recurrent UTI symptoms. -Cefdinir 300 mg twice daily  Weakness Concern on admission was related to infection in setting of colovesical history of versus glucocorticosteroid withdrawal. Physical therapy recommending SNF.  Colovesical fistula Patient followed by general surgery as an outpatient with plan for laparoscopic management. -General surgery recommendations: Following this admission  Psoriatic arthritis Patient has been managed with methotrexate, prednisone, Plaquenil, Enbrel which were held on previous discharge.  Patient's prednisone was decreased to 5 mg daily at discharge as well.  Prednisone was continued at 10 mg dose this admission.  Macrocytic anemia Currently baseline.  Folate and vitamin B12 levels ordered on admission and are normal.  Coronary artery disease Patient with history of NSTEMI.  Patient follows with cardiology as  outpatient. -Continue aspirin, nadolol, Crestor  Memory impairment Patient is aware of her memory deficits.  Unsure if this is related to dementia versus other cognitive impairment.  Patient is currently on donepezil as an outpatient -Continue donepezil  Diabetes mellitus, type II Patient is not on medication therapy as an outpatient.  Complicated by prednisone use.  Last hemoglobin A1c of 4.9% obtained this admission.  Patient is currently now on TPN. -Continue sliding scale insulin  CKD stage IIIb Baseline creatinine of 1.6-1.8.  Patient currently at baseline.  Moderate malnutrition Dietitian consulted.  Patient started on TPN per outpatient plan by PCP.  Patient presented with PICC line in place. -Continue TPN   DVT prophylaxis: Lovenox Code Status:   Code Status: Full Code Family Communication: Sister in room Disposition Plan: Discharge to SNF pending bed availability.   Consultants:   General surgery  Procedures:   None  Antimicrobials:  Zosyn IV    Subjective: Feeling well today although had some nausea with breakfast.  Objective: Vitals:   06/05/20 0511 06/05/20 1438 06/05/20 2051 06/06/20 0420  BP: 110/62 (!) 120/57 104/69 111/64  Pulse: 62 73 62 63  Resp: 15 17 16 16   Temp: 98.3 F (36.8 C) 98.6 F (37 C) 98.3 F (36.8 C) 98.3 F (36.8 C)  TempSrc: Oral Oral Oral Oral  SpO2: 94% 95% 93% 95%  Weight:      Height:        Intake/Output Summary (Last 24 hours) at 06/06/2020 1209 Last data filed at 06/06/2020 0422 Gross per 24 hour  Intake 1077.34 ml  Output 2300 ml  Net -1222.66 ml   Filed Weights   06/03/20 1315 06/04/20 0342  Weight: 90.3 kg 90.3 kg  Examination:  General exam: Appears calm and comfortable Respiratory system: Clear to auscultation. Respiratory effort normal. Cardiovascular system: S1 & S2 heard, RRR. Gastrointestinal system: Abdomen is nondistended, soft and nontender. No organomegaly or masses felt. Normal bowel  sounds heard. Central nervous system: Alert and oriented. No focal neurological deficits. Musculoskeletal: No edema. No calf tenderness Skin: No cyanosis. No rashes Psychiatry: Judgement and insight appear normal. Mood & affect appropriate.     Data Reviewed: I have personally reviewed following labs and imaging studies  CBC Lab Results  Component Value Date   WBC 8.2 06/06/2020   RBC 2.75 (L) 06/06/2020   HGB 9.1 (L) 06/06/2020   HCT 30.3 (L) 06/06/2020   MCV 110.2 (H) 06/06/2020   MCH 33.1 06/06/2020   PLT 155 06/06/2020   MCHC 30.0 06/06/2020   RDW 14.4 06/06/2020   LYMPHSABS 1.2 06/06/2020   MONOABS 0.7 06/06/2020   EOSABS 0.1 06/06/2020   BASOSABS 0.0 47/04/6282     Last metabolic panel Lab Results  Component Value Date   NA 140 06/06/2020   K 4.0 06/06/2020   CL 106 06/06/2020   CO2 31 06/06/2020   BUN 17 06/06/2020   CREATININE 1.45 (H) 06/06/2020   GLUCOSE 123 (H) 06/06/2020   GFRNONAA 39 (L) 06/06/2020   GFRAA 38 (L) 05/15/2020   CALCIUM 10.5 (H) 06/06/2020   PHOS 2.2 (L) 06/06/2020   PROT 4.7 (L) 06/06/2020   ALBUMIN 2.1 (L) 06/06/2020   BILITOT 0.2 (L) 06/06/2020   ALKPHOS 69 06/06/2020   AST 13 (L) 06/06/2020   ALT 12 06/06/2020   ANIONGAP 3 (L) 06/06/2020    CBG (last 3)  Recent Labs    06/06/20 0417 06/06/20 0754 06/06/20 1144  GLUCAP 113* 109* 165*     GFR: Estimated Creatinine Clearance: 43.1 mL/min (A) (by C-G formula based on SCr of 1.45 mg/dL (H)).  Coagulation Profile: No results for input(s): INR, PROTIME in the last 168 hours.  Recent Results (from the past 240 hour(s))  Respiratory Panel by RT PCR (Flu A&B, Covid) - Nasopharyngeal Swab     Status: None   Collection Time: 06/04/20  5:06 AM   Specimen: Nasopharyngeal Swab  Result Value Ref Range Status   SARS Coronavirus 2 by RT PCR NEGATIVE NEGATIVE Final    Comment: (NOTE) SARS-CoV-2 target nucleic acids are NOT DETECTED.  The SARS-CoV-2 RNA is generally detectable in  upper respiratoy specimens during the acute phase of infection. The lowest concentration of SARS-CoV-2 viral copies this assay can detect is 131 copies/mL. A negative result does not preclude SARS-Cov-2 infection and should not be used as the sole basis for treatment or other patient management decisions. A negative result may occur with  improper specimen collection/handling, submission of specimen other than nasopharyngeal swab, presence of viral mutation(s) within the areas targeted by this assay, and inadequate number of viral copies (<131 copies/mL). A negative result must be combined with clinical observations, patient history, and epidemiological information. The expected result is Negative.  Fact Sheet for Patients:  PinkCheek.be  Fact Sheet for Healthcare Providers:  GravelBags.it  This test is no t yet approved or cleared by the [place] FDA and  has been authorized for detection and/or diagnosis of SARS-CoV-2 by FDA under an Emergency Use Authorization (EUA). This EUA will remain  in effect (meaning this test can be used) for the duration of the COVID-19 declaration under Section 564(b)(1) of the Act, 21 U.S.C. section 360bbb-3(b)(1), unless the authorization is terminated or  revoked sooner.     Influenza A by PCR NEGATIVE NEGATIVE Final   Influenza B by PCR NEGATIVE NEGATIVE Final    Comment: (NOTE) The Xpert Xpress SARS-CoV-2/FLU/RSV assay is intended as an aid in  the diagnosis of influenza from Nasopharyngeal swab specimens and  should not be used as a sole basis for treatment. Nasal washings and  aspirates are unacceptable for Xpert Xpress SARS-CoV-2/FLU/RSV  testing.  Fact Sheet for Patients: PinkCheek.be  Fact Sheet for Healthcare Providers: GravelBags.it  This test is not yet approved or cleared by the [place] FDA and  has been  authorized for detection and/or diagnosis of SARS-CoV-2 by  FDA under an Emergency Use Authorization (EUA). This EUA will remain  in effect (meaning this test can be used) for the duration of the  Covid-19 declaration under Section 564(b)(1) of the Act, 21  U.S.C. section 360bbb-3(b)(1), unless the authorization is  terminated or revoked. Performed at [facility], [place] [address], [place], [place] [address]   Urine culture     Status: Abnormal   Collection Time: 06/04/20  6:08 AM   Specimen: Urine, Random  Result Value Ref Range Status   Specimen Description URINE, RANDOM  Final   Special Requests   Final    NONE Performed at [facility], [place] [address] [place], [place] [address]    Culture >=100,000 COLONIES/mL ESCHERICHIA COLI (A)  Final   Report Status 06/05/2020 FINAL  Final   Organism ID, Bacteria ESCHERICHIA COLI (A)  Final      Susceptibility   Escherichia coli - MIC*    AMPICILLIN >=32 RESISTANT Resistant     CEFAZOLIN 16 SENSITIVE Sensitive     CEFTRIAXONE <=0.25 SENSITIVE Sensitive     CIPROFLOXACIN 1 SENSITIVE Sensitive     GENTAMICIN <=1 SENSITIVE Sensitive     IMIPENEM <=0.25 SENSITIVE Sensitive     NITROFURANTOIN <=16 SENSITIVE Sensitive     TRIMETH/SULFA <=20 SENSITIVE Sensitive     AMPICILLIN/SULBACTAM >=32 RESISTANT Resistant     PIP/TAZO <=4 SENSITIVE Sensitive     * >=100,000 COLONIES/mL ESCHERICHIA COLI  Blood culture (routine x 2)     Status: None (Preliminary result)   Collection Time: 06/04/20  8:23 AM   Specimen: BLOOD  Result Value Ref Range Status   Specimen Description BLOOD RIGHT ANTECUBITAL  Final   Special Requests   Final    BOTTLES DRAWN AEROBIC ONLY Blood Culture results may not be optimal due to an inadequate volume of blood received in culture bottles   Culture   Final    NO GROWTH < 24 HOURS Performed at [facility], [address], [place], [place] [address]    Report Status PENDING  Incomplete  Blood culture  (routine x 2)     Status: None (Preliminary result)   Collection Time: 06/04/20  8:59 AM   Specimen: BLOOD LEFT HAND  Result Value Ref Range Status   Specimen Description BLOOD LEFT HAND  Final   Special Requests   Final    BOTTLES DRAWN AEROBIC ONLY Blood Culture results may not be optimal due to an inadequate volume of blood received in culture bottles   Culture   Final    NO GROWTH < 24 HOURS Performed at [facility], [place] [address], [place], [place] [address]    Report Status PENDING  Incomplete        Radiology Studies: No results found.      Scheduled  Meds: . aspirin EC  81 mg Oral Daily  . cefdinir  300 mg Oral Q12H  . Chlorhexidine Gluconate Cloth  6 each Topical Daily  . donepezil  10 mg Oral Daily  . enoxaparin (LOVENOX) injection  40 mg Subcutaneous Q24H  . escitalopram  30 mg Oral Daily  . insulin aspart  0-9 Units Subcutaneous Q12H  . lamoTRIgine  150 mg Oral QHS  . lamoTRIgine  75 mg Oral Daily  . nadolol  10 mg Oral Daily  . nystatin   Topical TID  . predniSONE  10 mg Oral Q breakfast  . rosuvastatin  5 mg Oral Daily  . senna-docusate  1 tablet Oral BID  . sodium chloride flush  10-40 mL Intracatheter Q12H  . traZODone  200 mg Oral QHS   Continuous Infusions: . TPN ADULT (ION) 60 mL/hr at 06/05/20 1730  . TPN ADULT (ION)       LOS: 2 days     Cordelia Poche, MD [facility] 06/06/2020, 12:09 PM  If 7PM-7AM, please contact night-coverage [email or web address]

## 2020-06-06 NOTE — Progress Notes (Signed)
Physical Therapy Treatment Patient Details Name: Valerie [name] MRN: [id number] DOB: 1961-02-03 Today's Date: 06/06/2020    History of Present Illness Pt is a 59 y/o female admitted secondary to progressive weakness and frequent falls. 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 CT abdomen/pelvis revealed left colonic diverticulosis with pericolonic edema/inflammation concerning for diverticulitis.  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 PMH includes bipolar disorder, CAD, DM, CKD, and psoriatic arthritis.     PT Comments    Pt progressing towards her physical therapy goals. Requiring min guard assist for transfers, ambulating 20 feet with a walker at a min assist level. Pt did experience one episode of knee buckling, requiring external assist by PT to correct. Pt demonstrates generalized weakness (RLE weaker than LLE), balance deficits, and decreased endurance. She presents as a high risk for falling based on history of recurrent falls and decreased gait speed. After discussion with pt/pt sister, recommending ST rehab to address deficits and maximize functional independence.     Follow Up Recommendations  SNF     Equipment Recommendations  None recommended by PT    Recommendations for Other Services       Precautions / Restrictions Precautions Precautions: Fall Restrictions Weight Bearing Restrictions: No    Mobility  Bed Mobility Overal bed mobility: Modified Independent                Transfers Overall transfer level: Needs assistance Equipment used: Rolling walker (2 wheeled) Transfers: Sit to/from Stand Sit to Stand: Min guard         General transfer comment: Cues for hand placement  Ambulation/Gait Ambulation/Gait assistance: Min assist Gait Distance (Feet): 20 Feet Assistive device: Rolling walker (2 wheeled) Gait Pattern/deviations: Step-through pattern;Decreased stride length;Trunk  flexed Gait velocity: decreased Gait velocity interpretation: <1.31 ft/sec, indicative of household ambulator General Gait Details: Cues for upright posture, walker proximity with negotiating threshold. Pt with one episode of knee buckle exiting bathroom, requiring minA by therapist to correct and bring back to upright   Stairs             Wheelchair Mobility    Modified Rankin (Stroke Patients Only)       Balance Overall balance assessment: Needs assistance Sitting-balance support: Feet unsupported;No upper extremity supported Sitting balance-Leahy Scale: Good     Standing balance support: Bilateral upper extremity supported Standing balance-Leahy Scale: Poor                              Cognition Arousal/Alertness: Awake/alert Behavior During Therapy: WFL for tasks assessed/performed Overall Cognitive Status: Within Functional Limits for tasks assessed                                        Exercises      General Comments        Pertinent Vitals/Pain Pain Assessment: Faces Faces Pain Scale: Hurts a little bit Pain Location: "toes," with standing Pain Descriptors / Indicators: Aching Pain Intervention(s): Monitored during session    Home Living                      Prior Function            PT Goals (current goals can now be found in the care plan section) Acute Rehab PT Goals Patient Stated Goal: to get stronger  PT Goal Formulation: With patient Time For Goal Achievement: 06/18/20 Potential to Achieve Goals: Good Progress towards PT goals: Progressing toward goals    Frequency    Min 2X/week      PT Plan Discharge plan needs to be updated    Co-evaluation              AM-PAC PT "6 Clicks" Mobility   Outcome Measure  Help needed turning from your back to your side while in a flat bed without using bedrails?: None Help needed moving from lying on your back to sitting on the side of a flat bed  without using bedrails?: None Help needed moving to and from a bed to a chair (including a wheelchair)?: A Little Help needed standing up from a chair using your arms (e.g., wheelchair or bedside chair)?: A Little Help needed to walk in hospital room?: A Little   6 Click Score: 17    End of Session Equipment Utilized During Treatment: Gait belt Activity Tolerance: Patient tolerated treatment well Patient left: with call bell/phone within reach;with family/visitor present;in chair;with chair alarm set (on stretcher in ED ) Nurse Communication: Mobility status PT Visit Diagnosis: Muscle weakness (generalized) (M62.81);History of falling (Z91.81);Unsteadiness on feet (R26.81)     Time: 2336-1224 PT Time Calculation (min) (ACUTE ONLY): 32 min  Charges:  $Gait Training: 8-22 mins $Therapeutic Activity: 8-22 mins                     Wyona Almas, PT, DPT Acute Rehabilitation Services Pager [phone] Office [phone]    Deno Etienne 06/06/2020, 11:29 AM

## 2020-06-07 DIAGNOSIS — N321 Vesicointestinal fistula: Secondary | ICD-10-CM | POA: Diagnosis not present

## 2020-06-07 DIAGNOSIS — E1140 Type 2 diabetes mellitus with diabetic neuropathy, unspecified: Secondary | ICD-10-CM | POA: Diagnosis not present

## 2020-06-07 DIAGNOSIS — E440 Moderate protein-calorie malnutrition: Secondary | ICD-10-CM | POA: Diagnosis not present

## 2020-06-07 DIAGNOSIS — N390 Urinary tract infection, site not specified: Secondary | ICD-10-CM | POA: Diagnosis not present

## 2020-06-07 LAB — BASIC METABOLIC PANEL
Anion gap: 3 — ABNORMAL LOW (ref 5–15)
BUN: 23 mg/dL — ABNORMAL HIGH (ref 6–20)
CO2: 32 mmol/L (ref 22–32)
Calcium: 10.7 mg/dL — ABNORMAL HIGH (ref 8.9–10.3)
Chloride: 109 mmol/L (ref 98–111)
Creatinine, Ser: 1.42 mg/dL — ABNORMAL HIGH (ref 0.44–1.00)
GFR, Estimated: 40 mL/min — ABNORMAL LOW (ref >60)
Glucose, Bld: 117 mg/dL — ABNORMAL HIGH (ref 70–99)
Potassium: 4.4 mmol/L (ref 3.5–5.1)
Sodium: 144 mmol/L (ref 135–145)

## 2020-06-07 LAB — CBC WITH DIFFERENTIAL/PLATELET
Abs Immature Granulocytes: 0.04 10*3/uL (ref 0.00–0.07)
Basophils Absolute: 0 10*3/uL (ref 0.0–0.1)
Basophils Relative: 1 %
Eosinophils Absolute: 0.2 10*3/uL (ref 0.0–0.5)
Eosinophils Relative: 3 %
HCT: 30.4 % — ABNORMAL LOW (ref 36.0–46.0)
Hemoglobin: 9.1 g/dL — ABNORMAL LOW (ref 12.0–15.0)
Immature Granulocytes: 1 %
Lymphocytes Relative: 19 %
Lymphs Abs: 1.3 10*3/uL (ref 0.7–4.0)
MCH: 33 pg (ref 26.0–34.0)
MCHC: 29.9 g/dL — ABNORMAL LOW (ref 30.0–36.0)
MCV: 110.1 fL — ABNORMAL HIGH (ref 80.0–100.0)
Monocytes Absolute: 0.6 10*3/uL (ref 0.1–1.0)
Monocytes Relative: 9 %
Neutro Abs: 4.6 10*3/uL (ref 1.7–7.7)
Neutrophils Relative %: 67 %
Platelets: 174 10*3/uL (ref 150–400)
RBC: 2.76 MIL/uL — ABNORMAL LOW (ref 3.87–5.11)
RDW: 14.5 % (ref 11.5–15.5)
WBC: 6.8 10*3/uL (ref 4.0–10.5)
nRBC: 0 % (ref 0.0–0.2)

## 2020-06-07 LAB — GLUCOSE, CAPILLARY
Glucose-Capillary: 117 mg/dL — ABNORMAL HIGH (ref 70–99)
Glucose-Capillary: 127 mg/dL — ABNORMAL HIGH (ref 70–99)

## 2020-06-07 LAB — MAGNESIUM: Magnesium: 2.1 mg/dL (ref 1.7–2.4)

## 2020-06-07 LAB — PHOSPHORUS: Phosphorus: 2.5 mg/dL (ref 2.5–4.6)

## 2020-06-07 MED ORDER — TRAVASOL 10 % IV SOLN
INTRAVENOUS | Status: AC
  Filled 2020-06-07: qty 1152

## 2020-06-07 MED ORDER — PANTOPRAZOLE SODIUM 40 MG PO TBEC
40.0000 mg | DELAYED_RELEASE_TABLET | Freq: Once | ORAL | Status: AC
  Administered 2020-06-07: 40 mg via ORAL
  Filled 2020-06-07: qty 1

## 2020-06-07 MED ORDER — METRONIDAZOLE 500 MG PO TABS
500.0000 mg | ORAL_TABLET | Freq: Three times a day (TID) | ORAL | Status: DC
  Administered 2020-06-07 – 2020-06-13 (×19): 500 mg via ORAL
  Filled 2020-06-07 (×19): qty 1

## 2020-06-07 NOTE — Progress Notes (Signed)
Physical Therapy Treatment Patient Details Name: Valerie [name] MRN: [id number] DOB: [birth year] Today's Date: 06/07/2020    History of Present Illness Pt is a 59 y/o female admitted secondary to progressive weakness and frequent falls. 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 CT abdomen/pelvis revealed left colonic diverticulosis with pericolonic edema/inflammation concerning for diverticulitis.  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 PMH includes bipolar disorder, CAD, DM, CKD, and psoriatic arthritis.     PT Comments    Pt making excellent progress towards her physical therapy goals, exhibiting improved activity tolerance and ambulation distance today. Session focused on seated exercises for strengthening and gait training. Pt ambulating 120 feet with a walker at a min assist level (chair follow utilized). Demonstrates right knee instability and decreased weight shift to right. Continues to present as a high fall risk based on history of recurrent falling and decreased gait speed. Will follow to progress mobility as tolerated.    Follow Up Recommendations  SNF v Home health PT;Supervision/Assistance - 24 hour     Equipment Recommendations  None recommended by PT    Recommendations for Other Services       Precautions / Restrictions Precautions Precautions: Fall Restrictions Weight Bearing Restrictions: No    Mobility  Bed Mobility               General bed mobility comments: OOB in chair  Transfers Overall transfer level: Needs assistance Equipment used: Rolling walker (2 wheeled) Transfers: Sit to/from Stand Sit to Stand: Min guard            Ambulation/Gait Ambulation/Gait assistance: Min assist Gait Distance (Feet): 120 Feet Assistive device: Rolling walker (2 wheeled) Gait Pattern/deviations: Step-through pattern;Decreased stride length;Decreased weight shift to right Gait velocity:  decreased   General Gait Details: Cues for upward gaze, posture, walker proximity. Chair follow utilized. MinA for stability; decreased weight shift to right which increased with fatigue.   Stairs             Wheelchair Mobility    Modified Rankin (Stroke Patients Only)       Balance Overall balance assessment: Needs assistance Sitting-balance support: Feet unsupported;No upper extremity supported Sitting balance-Leahy Scale: Good     Standing balance support: Bilateral upper extremity supported;During functional activity Standing balance-Leahy Scale: Poor Standing balance comment: reliant on external support                            Cognition Arousal/Alertness: Awake/alert Behavior During Therapy: WFL for tasks assessed/performed Overall Cognitive Status: Impaired/Different from baseline Area of Impairment: Memory;Problem solving                     Memory: Decreased short-term memory       Problem Solving: Requires verbal cues        Exercises General Exercises - Lower Extremity Long Arc Quad: Both;10 reps;Seated Hip Flexion/Marching: Both;10 reps;Seated Heel Raises: 15 reps;Both;Seated Other Exercises Other Exercises: x5 Sit to Stands    General Comments        Pertinent Vitals/Pain Pain Assessment: No/denies pain    Home Living                      Prior Function            PT Goals (current goals can now be found in the care plan section) Acute Rehab PT Goals Patient Stated Goal: to get stronger Potential  to Achieve Goals: Good Progress towards PT goals: Progressing toward goals    Frequency    Min 3X/week      PT Plan Discharge plan needs to be updated;Frequency needs to be updated    Co-evaluation              AM-PAC PT "6 Clicks" Mobility   Outcome Measure  Help needed turning from your back to your side while in a flat bed without using bedrails?: None Help needed moving from lying on  your back to sitting on the side of a flat bed without using bedrails?: None Help needed moving to and from a bed to a chair (including a wheelchair)?: A Little Help needed standing up from a chair using your arms (e.g., wheelchair or bedside chair)?: A Little Help needed to walk in hospital room?: A Little Help needed climbing 3-5 steps with a railing? : A Lot 6 Click Score: 19    End of Session Equipment Utilized During Treatment: Gait belt Activity Tolerance: Patient tolerated treatment well Patient left: in chair;with call bell/phone within reach;with family/visitor present Nurse Communication: Mobility status PT Visit Diagnosis: Muscle weakness (generalized) (M62.81);History of falling (Z91.81);Unsteadiness on feet (R26.81)     Time: 4276-7011 PT Time Calculation (min) (ACUTE ONLY): 19 min  Charges:  $Gait Training: 8-22 mins                     Wyona Almas, PT, DPT Acute Rehabilitation Services Pager [phone] Office [phone]    Deno Etienne 06/07/2020, 4:04 PM

## 2020-06-07 NOTE — Progress Notes (Signed)
                   RE:  Valerie [name]       Date of Birth:  03/26/1961     Date:   06/07/2020       To Whom It May Concern:  Please be advised that the above-named patient will require a short-term nursing home stay - anticipated 30 days or less for rehabilitation and strengthening.  The plan is for return home.

## 2020-06-07 NOTE — Progress Notes (Signed)
Pt states she would like to go to [facility].

## 2020-06-07 NOTE — Progress Notes (Signed)
PHARMACY - TOTAL PARENTERAL NUTRITION CONSULT NOTE   Indication: Colovesicular Fistula  Patient Measurements: Height: 5\' 2"  (157.5 cm) Weight: 90.3 kg (199 lb) IBW/kg (Calculated) : 50.1 TPN AdjBW (KG): 60.1 Body mass index is 36.4 kg/m. Usual Weight: 104kg  Assessment:  59 yo W recently admitted 05/14/25 for fistula now readmitted with worsening weakness, falls, lethargy, nausea, confusion and poor PO intake. PICC was placed outpatient 10/7 for TPN with first TPN to start 10/12. Plan for colectomy in 4-6 weeks after weaning off immunosuppresants. Poor PO intake since ~05/08/20 with documented weight loss of ~14% over 3-6 months.  Glucose / Insulin: BG <120, used 0 units insulin/24hr. On chronic prednisone 10mg . A1C 4.9, no diabetic meds PTA.  Electrolytes: Lytes wnl except CoCa 12 high  Renal: Scr 1.4, ClCr 43 ml/min LFTs / TGs: LFT wnl, TG 120 (10/12) Prealbumin / albumin: 15.6 (10/12), 2.1 (10/14) Intake / Output; MIVF: UOP 0.5 ml/kg/hr (incompletely documented), LBM 10/14 GI Imaging:  9/21 CT Abd: Large stool burden, possible colitis/diverticulitis, highly suspicious colovesicular fistula  10/12 CT Abd: L colonic diverticulitis, likely colovesical fistula and paracolonic abscess Surgeries / Procedures:  9/24 Flex sig: sigmoid stricture and diverticulosis   Central access: 10/7 PICC  TPN start date: 10/12   Nutritional Goals (per RD recommendation 10/13) kCal: 1800-2000, Protein: 110-125, Fluid: > 1.8 L/d Goal TPN rate is 80 mL/hr (provides 115g of protein and 1970 kcals per day)   Current Nutrition:  TPN, regular diet- eating 100% x2 meals  Plan:  Cycle TPN over 18 hours (56-113 mL/hr) at 1800 (Will provide 115gAA, 269g Dextrose, 60g Lipid, 1970 kcal; meeting 100% of estimated goals)  Electrolytes in TPN: Continue 75 mEq/L of Na, 28 mEq/L of K; 0 mEq/L of Ca, 41mEq/L of Mg, 17 mmol/L of Phos; Cl:Ac ratio 1:1 (unable to decrease acetate due to lyte components)  Add standard  MVI and trace elements to TPN Continue Sensitive SSI at q12h while cycling and discontinue if BG remains controlled  Monitor TPN labs while cycling and then on Mon/Thurs  F/u PO intake, if continues to eat well, decrease TPN   Benetta Spar, PharmD, BCPS, BCCP Clinical Pharmacist  Please check AMION for all [place] phone numbers After 10:00 PM, call [place]

## 2020-06-07 NOTE — Progress Notes (Signed)
Subjective: Patient feels well today.  Ate well yesterday.  Nausea seems to be improving.  Still with minimal LLQ discomfort.  Transitioned to oral abx yesterday.  ROS: See above, otherwise other systems negative  Objective: Vital signs in last 24 hours: Temp:  [97.9 F (36.6 C)-98.6 F (37 C)] 98.6 F (37 C) (10/15 0425) Pulse Rate:  [57-62] 59 (10/15 0425) Resp:  [16-18] 18 (10/15 0425) BP: (90-113)/(46-65) 103/46 (10/15 0425) SpO2:  [94 %-96 %] 95 % (10/15 0425) Last BM Date: 06/06/20  Intake/Output from previous day: 10/14 0701 - 10/15 0700 In: 2957.8 [P.O.:1060; I.V.:1897.8] Out: 1076 [Urine:1075; Stool:1] Intake/Output this shift: No intake/output data recorded.  PE: Abd: soft, minimally tender in LLQ, +BS, ND  Lab Results:  Recent Labs    06/06/20 0409 06/07/20 0346  WBC 8.2 6.8  HGB 9.1* 9.1*  HCT 30.3* 30.4*  PLT 155 174   BMET Recent Labs    06/06/20 0409 06/07/20 0346  NA 140 144  K 4.0 4.4  CL 106 109  CO2 31 32  GLUCOSE 123* 117*  BUN 17 23*  CREATININE 1.45* 1.42*  CALCIUM 10.5* 10.7*   PT/INR No results for input(s): LABPROT, INR in the last 72 hours. CMP     Component Value Date/Time   NA 144 06/07/2020 0346   K 4.4 06/07/2020 0346   CL 109 06/07/2020 0346   CO2 32 06/07/2020 0346   GLUCOSE 117 (H) 06/07/2020 0346   BUN 23 (H) 06/07/2020 0346   CREATININE 1.42 (H) 06/07/2020 0346   CREATININE 1.73 (H) 02/08/2018 1020   CALCIUM 10.7 (H) 06/07/2020 0346   PROT 4.7 (L) 06/06/2020 0409   ALBUMIN 2.1 (L) 06/06/2020 0409   AST 13 (L) 06/06/2020 0409   ALT 12 06/06/2020 0409   ALKPHOS 69 06/06/2020 0409   BILITOT 0.2 (L) 06/06/2020 0409   GFRNONAA 40 (L) 06/07/2020 0346   GFRNONAA 32 (L) 02/08/2018 1020   GFRAA 38 (L) 05/15/2020 0210   GFRAA 37 (L) 02/08/2018 1020   Lipase     Component Value Date/Time   LIPASE 26 05/14/2020 1514       Studies/Results: No results found.  Anti-infectives: Anti-infectives (From  admission, onward)   Start     Dose/Rate Route Frequency Ordered Stop   06/06/20 1000  cefdinir (OMNICEF) capsule 300 mg        300 mg Oral Every 12 hours 06/05/20 1955     06/04/20 1830  piperacillin-tazobactam (ZOSYN) IVPB 3.375 g  Status:  Discontinued        3.375 g 12.5 mL/hr over 240 Minutes Intravenous Every 8 hours 06/04/20 1806 06/05/20 1955   06/04/20 0430  piperacillin-tazobactam (ZOSYN) IVPB 3.375 g        3.375 g 100 mL/hr over 30 Minutes Intravenous  Once 06/04/20 0422 06/04/20 0538       Assessment/Plan Psoriatic arthritis-immunosuppressed; prednisone,methotrexate, Plaquenil and Enbrel, which are all being held by PCP per plan discussed at [facility] last admission, although prednisone has been reordered here. She was on 1/2 dose prednisone at home. Hx CAD/MI-10/2019 Hx CVA-on Plavix, can resume given no plans for surgery this admit Stage III kidney disease Multiple falls/severe deconditioning- SNF Moderate malnutrition-prealbumin 15. On TNA, shakes, and soft diet Failure to thrive Ongoing tobacco use-42 years Hxof anxiety and depression UTI- urine cx with >100,000 colonies of gram negative, c/w last urine cx at [facility] c/w colovesical fistula  Colovesicular fistula with sigmoid diverticulitis -oral abx therapy  for a total of about 2 weeks from date of admit -ok for DC to SNF when bed available from our standpoint -cont TNA and nutrition shakes and diet at SNF  -outpatient follow up arranged with Dr. Johney Maine to still plan for elective robotic resection -cont immunosuppressive care per PCP, would ideally like to decrease steroids to minimize post operative complications.  FEN -soft diet, TNA, ensure VTE -Lovenox; may resume plavix ID -[email or web address] 10/12-10/13: Cefdinir 10/14 Follow up: Dr. Johney Maine, appt made   LOS: 3 days    Henreitta Cea , [facility] 06/07/2020, 8:20 AM Please see Amion for pager number during day hours 7:00am-4:30pm or 7:00am  -11:30am on weekends

## 2020-06-07 NOTE — Progress Notes (Signed)
RT went to check on pt to see if she is ready to go on the CPAP, Pt said she will wear it by herself. It is her home unit.

## 2020-06-07 NOTE — Progress Notes (Signed)
PROGRESS NOTE    Valerie [name]  IEP:[id number] DOB: [birth year] DOA: 06/03/2020 PCP: Otelia Santee I, MD   Brief Narrative: Valerie [name] is a 59 y.o. female with a history of diabetes mellitus, type II, CKD stage IIIb, peripheral neuropathy, psoriatic arthritis, obstructive sleep apnea, CAD, colovesical fistula.  Patient presented secondary to worsening weakness with concern for urinary tract infection.  She was started on empiric antibiotics with cultures obtained.   Assessment & Plan:   Active Problems:   Anxiety   Psoriatic arthritis (HCC)   OSA (obstructive sleep apnea)   Bipolar disorder (HCC)   DM type 2 (diabetes mellitus, type 2) (HCC)   Colovesical fistula   Weakness   CAD (coronary artery disease)   Macrocytic anemia   Malnutrition of moderate degree   Urinary tract infection Patient empirically started on Zosyn IV for treatment.  Urine culture significant for E. Coli with sensitivities available.  In setting of colovesical fistula. Discussed with ID and recommending 7 day total treatment course with refill available for recurrent UTI symptoms. -Cefdinir 300 mg twice daily  Diverticulitis -Cefdinir as above and will add Flagyl to treat for 10-14 days total  Weakness Concern on admission was related to infection in setting of colovesical history of versus glucocorticosteroid withdrawal. Physical therapy recommending SNF.  Colovesical fistula Patient followed by general surgery as an outpatient with plan for laparoscopic management. -General surgery recommendations: Following this admission  Psoriatic arthritis Patient has been managed with methotrexate, prednisone, Plaquenil, Enbrel which were held on previous discharge.  Patient's prednisone was decreased to 5 mg daily at discharge as well.  Prednisone was continued at 10 mg dose this admission.  Macrocytic anemia Currently baseline.  Folate and vitamin B12 levels ordered on admission and are  normal.  Coronary artery disease Patient with history of NSTEMI.  Patient follows with cardiology as outpatient. -Continue aspirin, nadolol, Crestor  Memory impairment Patient is aware of her memory deficits.  Unsure if this is related to dementia versus other cognitive impairment.  Patient is currently on donepezil as an outpatient -Continue donepezil  Diabetes mellitus, type II Patient is not on medication therapy as an outpatient.  Complicated by prednisone use.  Last hemoglobin A1c of 4.9% obtained this admission.  Patient is currently now on TPN. -Continue sliding scale insulin  CKD stage IIIb Baseline creatinine of 1.6-1.8.  Patient currently at baseline.  Moderate malnutrition Dietitian consulted.  Patient started on TPN per outpatient plan by PCP.  Patient presented with PICC line in place. -Continue TPN   DVT prophylaxis: Lovenox Code Status:   Code Status: Full Code Family Communication: None at bedside Disposition Plan: Discharge to SNF pending bed availability. Likely in 3 days   Consultants:   General surgery  Procedures:   None  Antimicrobials:  Zosyn IV   Cefdinir  Flagyl   Subjective: Did not sleep well overnight. Otherwise no concerns.  Objective: Vitals:   06/06/20 0420 06/06/20 1324 06/06/20 2043 06/07/20 0425  BP: 111/64 (!) 90/50 113/65 (!) 103/46  Pulse: 63 62 (!) 57 (!) 59  Resp: 16 16 18 18   Temp: 98.3 F (36.8 C) 97.9 F (36.6 C) 98.6 F (37 C) 98.6 F (37 C)  TempSrc: Oral Oral Oral Oral  SpO2: 95% 96% 94% 95%  Weight:      Height:        Intake/Output Summary (Last 24 hours) at 06/07/2020 1310 Last data filed at 06/07/2020 0500 Gross per 24 hour  Intake 2477.78 ml  Output 1075 ml  Net 1402.78 ml   Filed Weights   06/03/20 1315 06/04/20 0342  Weight: 90.3 kg 90.3 kg    Examination:  General exam: Appears calm and comfortable Respiratory system: Clear to auscultation. Respiratory effort normal. Cardiovascular  system: S1 & S2 heard, RRR. No murmurs, rubs, gallops or clicks. Gastrointestinal system: Abdomen is nondistended, soft and nontender. No organomegaly or masses felt. Normal bowel sounds heard. Central nervous system: Alert and oriented. No focal neurological deficits. Musculoskeletal: No calf tenderness Skin: No cyanosis. No rashes Psychiatry: Judgement and insight appear normal. Mood & affect appropriate.      Data Reviewed: I have personally reviewed following labs and imaging studies  CBC Lab Results  Component Value Date   WBC 6.8 06/07/2020   RBC 2.76 (L) 06/07/2020   HGB 9.1 (L) 06/07/2020   HCT 30.4 (L) 06/07/2020   MCV 110.1 (H) 06/07/2020   MCH 33.0 06/07/2020   PLT 174 06/07/2020   MCHC 29.9 (L) 06/07/2020   RDW 14.5 06/07/2020   LYMPHSABS 1.3 06/07/2020   MONOABS 0.6 06/07/2020   EOSABS 0.2 06/07/2020   BASOSABS 0.0 89/38/1017     Last metabolic panel Lab Results  Component Value Date   NA 144 06/07/2020   K 4.4 06/07/2020   CL 109 06/07/2020   CO2 32 06/07/2020   BUN 23 (H) 06/07/2020   CREATININE 1.42 (H) 06/07/2020   GLUCOSE 117 (H) 06/07/2020   GFRNONAA 40 (L) 06/07/2020   GFRAA 38 (L) 05/15/2020   CALCIUM 10.7 (H) 06/07/2020   PHOS 2.5 06/07/2020   PROT 4.7 (L) 06/06/2020   ALBUMIN 2.1 (L) 06/06/2020   BILITOT 0.2 (L) 06/06/2020   ALKPHOS 69 06/06/2020   AST 13 (L) 06/06/2020   ALT 12 06/06/2020   ANIONGAP 3 (L) 06/07/2020    CBG (last 3)  Recent Labs    06/06/20 1636 06/06/20 2115 06/07/20 0807  GLUCAP 148* 118* 117*     GFR: Estimated Creatinine Clearance: 44 mL/min (A) (by C-G formula based on SCr of 1.42 mg/dL (H)).  Coagulation Profile: No results for input(s): INR, PROTIME in the last 168 hours.  Recent Results (from the past 240 hour(s))  Respiratory Panel by RT PCR (Flu A&B, Covid) - Nasopharyngeal Swab     Status: None   Collection Time: 06/04/20  5:06 AM   Specimen: Nasopharyngeal Swab  Result Value Ref Range Status    SARS Coronavirus 2 by RT PCR NEGATIVE NEGATIVE Final    Comment: (NOTE) SARS-CoV-2 target nucleic acids are NOT DETECTED.  The SARS-CoV-2 RNA is generally detectable in upper respiratoy specimens during the acute phase of infection. The lowest concentration of SARS-CoV-2 viral copies this assay can detect is 131 copies/mL. A negative result does not preclude SARS-Cov-2 infection and should not be used as the sole basis for treatment or other patient management decisions. A negative result may occur with  improper specimen collection/handling, submission of specimen other than nasopharyngeal swab, presence of viral mutation(s) within the areas targeted by this assay, and inadequate number of viral copies (<131 copies/mL). A negative result must be combined with clinical observations, patient history, and epidemiological information. The expected result is Negative.  Fact Sheet for Patients:  PinkCheek.be  Fact Sheet for Healthcare Providers:  GravelBags.it  This test is no t yet approved or cleared by the [place] FDA and  has been authorized for detection and/or diagnosis of SARS-CoV-2 by FDA under an Emergency Use Authorization (EUA). This EUA will remain  in effect (meaning this test can be used) for the duration of the COVID-19 declaration under Section 564(b)(1) of the Act, 21 U.S.C. section 360bbb-3(b)(1), unless the authorization is terminated or revoked sooner.     Influenza A by PCR NEGATIVE NEGATIVE Final   Influenza B by PCR NEGATIVE NEGATIVE Final    Comment: (NOTE) The Xpert Xpress SARS-CoV-2/FLU/RSV assay is intended as an aid in  the diagnosis of influenza from Nasopharyngeal swab specimens and  should not be used as a sole basis for treatment. Nasal washings and  aspirates are unacceptable for Xpert Xpress SARS-CoV-2/FLU/RSV  testing.  Fact Sheet for  Patients: PinkCheek.be  Fact Sheet for Healthcare Providers: GravelBags.it  This test is not yet approved or cleared by the [place] FDA and  has been authorized for detection and/or diagnosis of SARS-CoV-2 by  FDA under an Emergency Use Authorization (EUA). This EUA will remain  in effect (meaning this test can be used) for the duration of the  Covid-19 declaration under Section 564(b)(1) of the Act, 21  U.S.C. section 360bbb-3(b)(1), unless the authorization is  terminated or revoked. Performed at [facility], [place] [address], [place], [place] [address]   Urine culture     Status: Abnormal   Collection Time: 06/04/20  6:08 AM   Specimen: Urine, Random  Result Value Ref Range Status   Specimen Description URINE, RANDOM  Final   Special Requests   Final    NONE Performed at [facility], [place] [address], [place], [place] [address]    Culture >=100,000 COLONIES/mL ESCHERICHIA COLI (A)  Final   Report Status 06/05/2020 FINAL  Final   Organism ID, Bacteria ESCHERICHIA COLI (A)  Final      Susceptibility   Escherichia coli - MIC*    AMPICILLIN >=32 RESISTANT Resistant     CEFAZOLIN 16 SENSITIVE Sensitive     CEFTRIAXONE <=0.25 SENSITIVE Sensitive     CIPROFLOXACIN 1 SENSITIVE Sensitive     GENTAMICIN <=1 SENSITIVE Sensitive     IMIPENEM <=0.25 SENSITIVE Sensitive     NITROFURANTOIN <=16 SENSITIVE Sensitive     TRIMETH/SULFA <=20 SENSITIVE Sensitive     AMPICILLIN/SULBACTAM >=32 RESISTANT Resistant     PIP/TAZO <=4 SENSITIVE Sensitive     * >=100,000 COLONIES/mL ESCHERICHIA COLI  Blood culture (routine x 2)     Status: None (Preliminary result)   Collection Time: 06/04/20  8:23 AM   Specimen: BLOOD  Result Value Ref Range Status   Specimen Description BLOOD RIGHT ANTECUBITAL  Final   Special Requests   Final    BOTTLES DRAWN AEROBIC ONLY Blood Culture results may not be optimal due to an inadequate  volume of blood received in culture bottles   Culture   Final    NO GROWTH 3 DAYS Performed at [facility], [address] [place], [place] [address]    Report Status PENDING  Incomplete  Blood culture (routine x 2)     Status: None (Preliminary result)   Collection Time: 06/04/20  8:59 AM   Specimen: BLOOD LEFT HAND  Result Value Ref Range Status   Specimen Description BLOOD LEFT HAND  Final   Special Requests   Final    BOTTLES DRAWN AEROBIC ONLY Blood Culture results may not be optimal due to an inadequate volume of blood received in culture bottles   Culture   Final    NO GROWTH 3 DAYS Performed at [facility], [place] [address]  [place], [place] [address]    Report Status PENDING  Incomplete        Radiology Studies: No results found.      Scheduled Meds: . aspirin EC  81 mg Oral Daily  . cefdinir  300 mg Oral Q12H  . Chlorhexidine Gluconate Cloth  6 each Topical Daily  . donepezil  10 mg Oral Daily  . enoxaparin (LOVENOX) injection  40 mg Subcutaneous Q24H  . escitalopram  30 mg Oral Daily  . insulin aspart  0-9 Units Subcutaneous Q12H  . lamoTRIgine  150 mg Oral QHS  . lamoTRIgine  75 mg Oral Daily  . metroNIDAZOLE  500 mg Oral Q8H  . nadolol  10 mg Oral Daily  . nystatin   Topical TID  . predniSONE  10 mg Oral Q breakfast  . rosuvastatin  5 mg Oral Daily  . senna-docusate  1 tablet Oral BID  . sodium chloride flush  10-40 mL Intracatheter Q12H  . traZODone  200 mg Oral QHS   Continuous Infusions: . TPN ADULT (ION) 80 mL/hr at 06/06/20 1800  . TPN CYCLIC-ADULT (ION)       LOS: 3 days     Cordelia Poche, MD [facility] 06/07/2020, 1:10 PM  If 7PM-7AM, please contact night-coverage [email or web address]

## 2020-06-08 DIAGNOSIS — N390 Urinary tract infection, site not specified: Secondary | ICD-10-CM | POA: Diagnosis not present

## 2020-06-08 DIAGNOSIS — E440 Moderate protein-calorie malnutrition: Secondary | ICD-10-CM | POA: Diagnosis not present

## 2020-06-08 DIAGNOSIS — E1140 Type 2 diabetes mellitus with diabetic neuropathy, unspecified: Secondary | ICD-10-CM | POA: Diagnosis not present

## 2020-06-08 DIAGNOSIS — N321 Vesicointestinal fistula: Secondary | ICD-10-CM | POA: Diagnosis not present

## 2020-06-08 LAB — BASIC METABOLIC PANEL
Anion gap: 6 (ref 5–15)
BUN: 35 mg/dL — ABNORMAL HIGH (ref 6–20)
CO2: 28 mmol/L (ref 22–32)
Calcium: 10.4 mg/dL — ABNORMAL HIGH (ref 8.9–10.3)
Chloride: 107 mmol/L (ref 98–111)
Creatinine, Ser: 1.39 mg/dL — ABNORMAL HIGH (ref 0.44–1.00)
GFR, Estimated: 41 mL/min — ABNORMAL LOW (ref >60)
Glucose, Bld: 118 mg/dL — ABNORMAL HIGH (ref 70–99)
Potassium: 4.1 mmol/L (ref 3.5–5.1)
Sodium: 141 mmol/L (ref 135–145)

## 2020-06-08 LAB — CBC WITH DIFFERENTIAL/PLATELET
Abs Immature Granulocytes: 0.06 10*3/uL (ref 0.00–0.07)
Basophils Absolute: 0.1 10*3/uL (ref 0.0–0.1)
Basophils Relative: 1 %
Eosinophils Absolute: 0.1 10*3/uL (ref 0.0–0.5)
Eosinophils Relative: 2 %
HCT: 27.9 % — ABNORMAL LOW (ref 36.0–46.0)
Hemoglobin: 8.6 g/dL — ABNORMAL LOW (ref 12.0–15.0)
Immature Granulocytes: 1 %
Lymphocytes Relative: 16 %
Lymphs Abs: 1.1 10*3/uL (ref 0.7–4.0)
MCH: 34.1 pg — ABNORMAL HIGH (ref 26.0–34.0)
MCHC: 30.8 g/dL (ref 30.0–36.0)
MCV: 110.7 fL — ABNORMAL HIGH (ref 80.0–100.0)
Monocytes Absolute: 0.6 10*3/uL (ref 0.1–1.0)
Monocytes Relative: 9 %
Neutro Abs: 5.1 10*3/uL (ref 1.7–7.7)
Neutrophils Relative %: 71 %
Platelets: 194 10*3/uL (ref 150–400)
RBC: 2.52 MIL/uL — ABNORMAL LOW (ref 3.87–5.11)
RDW: 14.3 % (ref 11.5–15.5)
WBC: 7.1 10*3/uL (ref 4.0–10.5)
nRBC: 0 % (ref 0.0–0.2)

## 2020-06-08 LAB — GLUCOSE, CAPILLARY
Glucose-Capillary: 112 mg/dL — ABNORMAL HIGH (ref 70–99)
Glucose-Capillary: 114 mg/dL — ABNORMAL HIGH (ref 70–99)
Glucose-Capillary: 153 mg/dL — ABNORMAL HIGH (ref 70–99)
Glucose-Capillary: 170 mg/dL — ABNORMAL HIGH (ref 70–99)
Glucose-Capillary: 190 mg/dL — ABNORMAL HIGH (ref 70–99)

## 2020-06-08 LAB — MAGNESIUM: Magnesium: 2 mg/dL (ref 1.7–2.4)

## 2020-06-08 LAB — PHOSPHORUS: Phosphorus: 2.7 mg/dL (ref 2.5–4.6)

## 2020-06-08 MED ORDER — INSULIN ASPART 100 UNIT/ML ~~LOC~~ SOLN
0.0000 [IU] | SUBCUTANEOUS | Status: DC
  Administered 2020-06-08: 2 [IU] via SUBCUTANEOUS
  Administered 2020-06-09: 1 [IU] via SUBCUTANEOUS
  Administered 2020-06-09: 2 [IU] via SUBCUTANEOUS

## 2020-06-08 MED ORDER — TRAVASOL 10 % IV SOLN
INTRAVENOUS | Status: AC
  Filled 2020-06-08: qty 1154.4

## 2020-06-08 MED ORDER — TRAVASOL 10 % IV SOLN: INTRAVENOUS | Status: DC

## 2020-06-08 NOTE — Progress Notes (Signed)
PHARMACY - TOTAL PARENTERAL NUTRITION CONSULT NOTE   Indication: Colovesicular Fistula  Patient Measurements: Height: 5\' 2"  (157.5 cm) Weight: 90.3 kg (199 lb) IBW/kg (Calculated) : 50.1 TPN AdjBW (KG): 60.1 Body mass index is 36.4 kg/m. Usual Weight: 104kg  Assessment:  59 yo W recently admitted 05/14/25 for fistula now readmitted with worsening weakness, falls, lethargy, nausea, confusion and poor PO intake. PICC was placed outpatient 10/7 for TPN with first TPN to start 10/12. Plan for colectomy in 4-6 weeks after weaning off immunosuppresants. Poor PO intake since ~05/08/20 with documented weight loss of ~14% over 3-6 months.  Glucose / Insulin: CBGs controlled, utilized 1u SSI in last 24hrs. On chronic prednisone 10mg . A1C 4.9 - no diabetic meds PTA.  Electrolytes: all WNL except corrected Ca high ~12.8 Renal: AKI improving - Scr 1.39 stable today, BUN up to 35 LFTs / TGs: LFTs / Tbili / TG WNL Prealbumin / albumin: Prealbumin 15.6; albumin 2.1 Intake / Output; MIVF: UOP 0.5 ml/kg/hr (incompletely documented), LBM 10/15 GI Imaging: 9/21 CT Abd: Large stool burden, possible colitis/diverticulitis, highly suspicious colovesicular fistula  10/12 CT Abd: L colonic diverticulitis, likely colovesical fistula and paracolonic abscess Surgeries / Procedures: 9/24 Flex sig: sigmoid stricture and diverticulosis   Central access: 10/7 PICC  TPN start date: 10/12   Nutritional Goals (per RD recommendation 10/13) kCal: 1800-2000, Protein: 110-125g, Fluid: > 1.8 L/d Goal TPN rate is 80 mL/hr (provides 115g of protein and 1970 kcals per day)   Current Nutrition:  TPN, regular diet- eating 75-100% x2 meals per documentation  Plan:  Advance to 16-FB cyclic TPN at 9038. Plan to titrate further to 12-hr cycle on 10/17 if patient tolerates. Cycle 1924 ml over 14hrs: 74 ml/hr x 1 hr; then 148 ml/hr x 12 hrs; then 74 ml/hr x 1 hr Will provide 115g AA, 269g Dextrose, 60g Lipid, and 1974 kCal;  meeting 100% of patient protein and kCal needs)  Electrolytes in TPN: Continue 75 mEq/L of Na, 28 mEq/L of K; 0 mEq/L of Ca, 62mEq/L of Mg, 17 mmol/L of Phos; Cl:Ac ratio 1:1 Notified MD of high Ca Add standard MVI and trace elements to TPN Change Sensitive SSI to custom cyclic 4x daily schedule and adjust as needed Monitor TPN labs, PO intake and ability to wean TPN Planning SNF when bed available per MD notes   Arturo Morton, PharmD, BCPS Please check AMION for all [facility] contact numbers Clinical Pharmacist 06/08/2020 8:41 AM

## 2020-06-08 NOTE — Progress Notes (Signed)
PROGRESS NOTE    Valerie [name]  IRW:[id number] DOB: November 24, 1960 DOA: 06/03/2020 PCP: Otelia Santee I, MD   Brief Narrative: Valerie [name] is a 59 y.o. female with a history of diabetes mellitus, type II, CKD stage IIIb, peripheral neuropathy, psoriatic arthritis, obstructive sleep apnea, CAD, colovesical fistula.  Patient presented secondary to worsening weakness with concern for urinary tract infection.  She was started on empiric antibiotics with cultures obtained.   Assessment & Plan:   Active Problems:   Anxiety   Psoriatic arthritis (HCC)   OSA (obstructive sleep apnea)   Bipolar disorder (HCC)   DM type 2 (diabetes mellitus, type 2) (HCC)   Colovesical fistula   Weakness   CAD (coronary artery disease)   Macrocytic anemia   Malnutrition of moderate degree   Urinary tract infection Patient empirically started on Zosyn IV for treatment.  Urine culture significant for E. Coli with sensitivities available.  In setting of colovesical fistula. Discussed with ID and recommending 7 day total treatment course with refill available for recurrent UTI symptoms. -Cefdinir 300 mg twice daily  Diverticulitis -Cefdinir as above in addition to Flagyl to treat for 10-14 days total  Weakness Concern on admission was related to infection in setting of colovesical history of versus glucocorticosteroid withdrawal. Physical therapy recommending SNF.  Colovesical fistula Patient followed by general surgery as an outpatient with plan for laparoscopic management. -General surgery recommendations: Following this admission. Plan for outpatient management  Psoriatic arthritis Patient has been managed with methotrexate, prednisone, Plaquenil, Enbrel which were held on previous discharge.  Patient's prednisone was decreased to 5 mg daily at discharge as well.  Prednisone was continued at 10 mg dose this admission.  Macrocytic anemia Currently baseline.  Folate and vitamin B12 levels ordered on  admission and are normal.  Coronary artery disease Patient with history of NSTEMI.  Patient follows with cardiology as outpatient. -Continue aspirin, nadolol, Crestor  Memory impairment Patient is aware of her memory deficits.  Unsure if this is related to dementia versus other cognitive impairment.  Patient is currently on donepezil as an outpatient -Continue donepezil  Diabetes mellitus, type II Patient is not on medication therapy as an outpatient.  Complicated by prednisone use.  Last hemoglobin A1c of 4.9% obtained this admission.  Patient is currently now on TPN. -Continue sliding scale insulin  CKD stage IIIb Baseline creatinine of 1.6-1.8.  Patient currently at baseline.  Moderate malnutrition Dietitian consulted.  Patient started on TPN per outpatient plan by PCP.  Patient presented with PICC line in place. -Continue TPN   DVT prophylaxis: Lovenox Code Status:   Code Status: Full Code Family Communication: None at bedside Disposition Plan: Discharge to SNF pending bed availability. Likely in 2-3 days   Consultants:   General surgery  Procedures:   None  Antimicrobials:  Zosyn IV   Cefdinir  Flagyl   Subjective: No issues overnight. Slept well. No concerns.  Objective: Vitals:   06/07/20 2107 06/07/20 2247 06/08/20 0448 06/08/20 0517  BP: 118/69  (!) 96/48 126/64  Pulse: (!) 58 (!) 59 (!) 59 62  Resp: 20 18 17    Temp: 97.8 F (36.6 C)  (!) 97.5 F (36.4 C)   TempSrc: Oral     SpO2: 98% 98% 92%   Weight:      Height:        Intake/Output Summary (Last 24 hours) at 06/08/2020 1048 Last data filed at 06/08/2020 0252 Gross per 24 hour  Intake 1181.23 ml  Output --  Net 1181.23 ml   Filed Weights   06/03/20 1315 06/04/20 0342  Weight: 90.3 kg 90.3 kg    Examination:  General exam: Appears calm and comfortable Respiratory system: Clear to auscultation. Respiratory effort normal. Cardiovascular system: S1 & S2 heard, RRR. No murmurs,  rubs, gallops or clicks. Gastrointestinal system: Abdomen is nondistended, soft and nontender. No organomegaly or masses felt. Normal bowel sounds heard. Central nervous system: Alert and oriented. No focal neurological deficits. Musculoskeletal: 1+ B/L LE edema. No calf tenderness Skin: No cyanosis. No rashes Psychiatry: Judgement and insight appear normal. Mood & affect appropriate.       Data Reviewed: I have personally reviewed following labs and imaging studies  CBC Lab Results  Component Value Date   WBC 7.1 06/08/2020   RBC 2.52 (L) 06/08/2020   HGB 8.6 (L) 06/08/2020   HCT 27.9 (L) 06/08/2020   MCV 110.7 (H) 06/08/2020   MCH 34.1 (H) 06/08/2020   PLT 194 06/08/2020   MCHC 30.8 06/08/2020   RDW 14.3 06/08/2020   LYMPHSABS 1.1 06/08/2020   MONOABS 0.6 06/08/2020   EOSABS 0.1 06/08/2020   BASOSABS 0.1 16/05/9603     Last metabolic panel Lab Results  Component Value Date   NA 141 06/08/2020   K 4.1 06/08/2020   CL 107 06/08/2020   CO2 28 06/08/2020   BUN 35 (H) 06/08/2020   CREATININE 1.39 (H) 06/08/2020   GLUCOSE 118 (H) 06/08/2020   GFRNONAA 41 (L) 06/08/2020   GFRAA 38 (L) 05/15/2020   CALCIUM 10.4 (H) 06/08/2020   PHOS 2.7 06/08/2020   PROT 4.7 (L) 06/06/2020   ALBUMIN 2.1 (L) 06/06/2020   BILITOT 0.2 (L) 06/06/2020   ALKPHOS 69 06/06/2020   AST 13 (L) 06/06/2020   ALT 12 06/06/2020   ANIONGAP 6 06/08/2020    CBG (last 3)  Recent Labs    06/07/20 0807 06/07/20 2140 06/08/20 0812  GLUCAP 117* 127* 114*     GFR: Estimated Creatinine Clearance: 45 mL/min (A) (by C-G formula based on SCr of 1.39 mg/dL (H)).  Coagulation Profile: No results for input(s): INR, PROTIME in the last 168 hours.  Recent Results (from the past 240 hour(s))  Respiratory Panel by RT PCR (Flu A&B, Covid) - Nasopharyngeal Swab     Status: None   Collection Time: 06/04/20  5:06 AM   Specimen: Nasopharyngeal Swab  Result Value Ref Range Status   SARS Coronavirus 2 by RT  PCR NEGATIVE NEGATIVE Final    Comment: (NOTE) SARS-CoV-2 target nucleic acids are NOT DETECTED.  The SARS-CoV-2 RNA is generally detectable in upper respiratoy specimens during the acute phase of infection. The lowest concentration of SARS-CoV-2 viral copies this assay can detect is 131 copies/mL. A negative result does not preclude SARS-Cov-2 infection and should not be used as the sole basis for treatment or other patient management decisions. A negative result may occur with  improper specimen collection/handling, submission of specimen other than nasopharyngeal swab, presence of viral mutation(s) within the areas targeted by this assay, and inadequate number of viral copies (<131 copies/mL). A negative result must be combined with clinical observations, patient history, and epidemiological information. The expected result is Negative.  Fact Sheet for Patients:  PinkCheek.be  Fact Sheet for Healthcare Providers:  GravelBags.it  This test is no t yet approved or cleared by the [place] FDA and  has been authorized for detection and/or diagnosis of SARS-CoV-2 by FDA under an Emergency Use Authorization (EUA). This EUA will remain  in effect (meaning this test can be used) for the duration of the COVID-19 declaration under Section 564(b)(1) of the Act, 21 U.S.C. section 360bbb-3(b)(1), unless the authorization is terminated or revoked sooner.     Influenza A by PCR NEGATIVE NEGATIVE Final   Influenza B by PCR NEGATIVE NEGATIVE Final    Comment: (NOTE) The Xpert Xpress SARS-CoV-2/FLU/RSV assay is intended as an aid in  the diagnosis of influenza from Nasopharyngeal swab specimens and  should not be used as a sole basis for treatment. Nasal washings and  aspirates are unacceptable for Xpert Xpress SARS-CoV-2/FLU/RSV  testing.  Fact Sheet for Patients: PinkCheek.be  Fact Sheet for  Healthcare Providers: GravelBags.it  This test is not yet approved or cleared by the [place] FDA and  has been authorized for detection and/or diagnosis of SARS-CoV-2 by  FDA under an Emergency Use Authorization (EUA). This EUA will remain  in effect (meaning this test can be used) for the duration of the  Covid-19 declaration under Section 564(b)(1) of the Act, 21  U.S.C. section 360bbb-3(b)(1), unless the authorization is  terminated or revoked. Performed at [facility], [place] [address] [place], [place] [address]   Urine culture     Status: Abnormal   Collection Time: 06/04/20  6:08 AM   Specimen: Urine, Random  Result Value Ref Range Status   Specimen Description URINE, RANDOM  Final   Special Requests   Final    NONE Performed at [facility], [place] [address], [place], [place] [address]    Culture >=100,000 COLONIES/mL ESCHERICHIA COLI (A)  Final   Report Status 06/05/2020 FINAL  Final   Organism ID, Bacteria ESCHERICHIA COLI (A)  Final      Susceptibility   Escherichia coli - MIC*    AMPICILLIN >=32 RESISTANT Resistant     CEFAZOLIN 16 SENSITIVE Sensitive     CEFTRIAXONE <=0.25 SENSITIVE Sensitive     CIPROFLOXACIN 1 SENSITIVE Sensitive     GENTAMICIN <=1 SENSITIVE Sensitive     IMIPENEM <=0.25 SENSITIVE Sensitive     NITROFURANTOIN <=16 SENSITIVE Sensitive     TRIMETH/SULFA <=20 SENSITIVE Sensitive     AMPICILLIN/SULBACTAM >=32 RESISTANT Resistant     PIP/TAZO <=4 SENSITIVE Sensitive     * >=100,000 COLONIES/mL ESCHERICHIA COLI  Blood culture (routine x 2)     Status: None (Preliminary result)   Collection Time: 06/04/20  8:23 AM   Specimen: BLOOD  Result Value Ref Range Status   Specimen Description BLOOD RIGHT ANTECUBITAL  Final   Special Requests   Final    BOTTLES DRAWN AEROBIC ONLY Blood Culture results may not be optimal due to an inadequate volume of blood received in culture bottles   Culture   Final    NO  GROWTH 4 DAYS Performed at [facility] [address], [place], [place] [address]    Report Status PENDING  Incomplete  Blood culture (routine x 2)     Status: None (Preliminary result)   Collection Time: 06/04/20  8:59 AM   Specimen: BLOOD LEFT HAND  Result Value Ref Range Status   Specimen Description BLOOD LEFT HAND  Final   Special Requests   Final    BOTTLES DRAWN AEROBIC ONLY Blood Culture results may not be optimal due to an inadequate volume of blood received in culture bottles   Culture   Final    NO GROWTH 4 DAYS Performed at [facility], [place] [address],  [place], [place] [address]    Report Status PENDING  Incomplete        Radiology Studies: No results found.      Scheduled Meds:  aspirin EC  81 mg Oral Daily   cefdinir  300 mg Oral Q12H   Chlorhexidine Gluconate Cloth  6 each Topical Daily   donepezil  10 mg Oral Daily   enoxaparin (LOVENOX) injection  40 mg Subcutaneous Q24H   escitalopram  30 mg Oral Daily   insulin aspart  0-9 Units Subcutaneous 4 times per day   lamoTRIgine  150 mg Oral QHS   lamoTRIgine  75 mg Oral Daily   metroNIDAZOLE  500 mg Oral Q8H   nadolol  10 mg Oral Daily   nystatin   Topical TID   predniSONE  10 mg Oral Q breakfast   rosuvastatin  5 mg Oral Daily   senna-docusate  1 tablet Oral BID   sodium chloride flush  10-40 mL Intracatheter Q12H   traZODone  200 mg Oral QHS   Continuous Infusions:  TPN CYCLIC-ADULT (ION) 213 mL/hr at 08/65/78 4696   TPN CYCLIC-ADULT (ION)       LOS: 4 days     Cordelia Poche, MD [facility] 06/08/2020, 10:48 AM  If 7PM-7AM, please contact night-coverage [email or web address]

## 2020-06-08 NOTE — Progress Notes (Signed)
[facility] Progress Note:   * No surgery found *  Subjective: Mental status is clear.  Complaints none-she says that her pain is much better. Objective: Vital signs in last 24 hours: Temp:  [97.5 F (36.4 C)-98.4 F (36.9 C)] 97.5 F (36.4 C) (10/16 0448) Pulse Rate:  [55-62] 62 (10/16 0517) Resp:  [17-20] 17 (10/16 0448) BP: (96-126)/(48-69) 126/64 (10/16 0517) SpO2:  [92 %-100 %] 92 % (10/16 0448)  Intake/Output from previous day: 10/15 0701 - 10/16 0700 In: 1181.2 [P.O.:280; I.V.:901.2] Out: -  Intake/Output this shift: No intake/output data recorded.  Physical Exam: Work of breathing --CPAP used when sleeping and removed for exam.  Less abdominal tenderness  Lab Results:  Results for orders placed or performed during the hospital encounter of 06/03/20 (from the past 48 hour(s))  Glucose, capillary     Status: Abnormal   Collection Time: 06/06/20 11:44 AM  Result Value Ref Range   Glucose-Capillary 165 (H) 70 - 99 mg/dL    Comment: Glucose reference range applies only to samples taken after fasting for at least 8 hours.   Comment 1 Notify RN   Glucose, capillary     Status: Abnormal   Collection Time: 06/06/20  4:36 PM  Result Value Ref Range   Glucose-Capillary 148 (H) 70 - 99 mg/dL    Comment: Glucose reference range applies only to samples taken after fasting for at least 8 hours.   Comment 1 Notify RN   Glucose, capillary     Status: Abnormal   Collection Time: 06/06/20  9:15 PM  Result Value Ref Range   Glucose-Capillary 118 (H) 70 - 99 mg/dL    Comment: Glucose reference range applies only to samples taken after fasting for at least 8 hours.  CBC with Differential/Platelet     Status: Abnormal   Collection Time: 06/07/20  3:46 AM  Result Value Ref Range   WBC 6.8 4.0 - 10.5 K/uL   RBC 2.76 (L) 3.87 - 5.11 MIL/uL   Hemoglobin 9.1 (L) 12.0 - 15.0 g/dL   HCT 30.4 (L) 36 - 46 %   MCV 110.1 (H) 80.0 - 100.0 fL   MCH 33.0 26.0 - 34.0 pg   MCHC 29.9  (L) 30.0 - 36.0 g/dL   RDW 14.5 11.5 - 15.5 %   Platelets 174 150 - 400 K/uL   nRBC 0.0 0.0 - 0.2 %   Neutrophils Relative % 67 %   Neutro Abs 4.6 1.7 - 7.7 K/uL   Lymphocytes Relative 19 %   Lymphs Abs 1.3 0.7 - 4.0 K/uL   Monocytes Relative 9 %   Monocytes Absolute 0.6 0.1 - 1.0 K/uL   Eosinophils Relative 3 %   Eosinophils Absolute 0.2 0.0 - 0.5 K/uL   Basophils Relative 1 %   Basophils Absolute 0.0 0.0 - 0.1 K/uL   Immature Granulocytes 1 %   Abs Immature Granulocytes 0.04 0.00 - 0.07 K/uL    Comment: Performed at [facility], [address] [place],  [address]  Basic metabolic panel     Status: Abnormal   Collection Time: 06/07/20  3:46 AM  Result Value Ref Range   Sodium 144 135 - 145 mmol/L   Potassium 4.4 3.5 - 5.1 mmol/L   Chloride 109 98 - 111 mmol/L   CO2 32 22 - 32 mmol/L   Glucose, Bld 117 (H) 70 - 99 mg/dL    Comment: Glucose reference range applies only to samples taken after fasting  for at least 8 hours.   BUN 23 (H) 6 - 20 mg/dL   Creatinine, Ser 1.42 (H) 0.44 - 1.00 mg/dL   Calcium 10.7 (H) 8.9 - 10.3 mg/dL   GFR, Estimated 40 (L) >60 mL/min   Anion gap 3 (L) 5 - 15    Comment: Performed at [facility] [address] [place], [place] [address]  Magnesium     Status: None   Collection Time: 06/07/20  3:46 AM  Result Value Ref Range   Magnesium 2.1 1.7 - 2.4 mg/dL    Comment: Performed at [facility] [address], [place], [place] [address]  Phosphorus     Status: None   Collection Time: 06/07/20  3:46 AM  Result Value Ref Range   Phosphorus 2.5 2.5 - 4.6 mg/dL    Comment: Performed at [facility] [address] [place], [place] [address]  Glucose, capillary     Status: Abnormal   Collection Time: 06/07/20  8:07 AM  Result Value Ref Range   Glucose-Capillary 117 (H) 70 - 99 mg/dL    Comment: Glucose reference range applies only to samples taken after fasting for at least 8 hours.  Glucose, capillary     Status:  Abnormal   Collection Time: 06/07/20  9:40 PM  Result Value Ref Range   Glucose-Capillary 127 (H) 70 - 99 mg/dL    Comment: Glucose reference range applies only to samples taken after fasting for at least 8 hours.  CBC with Differential/Platelet     Status: Abnormal   Collection Time: 06/08/20  4:00 AM  Result Value Ref Range   WBC 7.1 4.0 - 10.5 K/uL   RBC 2.52 (L) 3.87 - 5.11 MIL/uL   Hemoglobin 8.6 (L) 12.0 - 15.0 g/dL   HCT 27.9 (L) 36 - 46 %   MCV 110.7 (H) 80.0 - 100.0 fL   MCH 34.1 (H) 26.0 - 34.0 pg   MCHC 30.8 30.0 - 36.0 g/dL   RDW 14.3 11.5 - 15.5 %   Platelets 194 150 - 400 K/uL   nRBC 0.0 0.0 - 0.2 %   Neutrophils Relative % 71 %   Neutro Abs 5.1 1.7 - 7.7 K/uL   Lymphocytes Relative 16 %   Lymphs Abs 1.1 0.7 - 4.0 K/uL   Monocytes Relative 9 %   Monocytes Absolute 0.6 0.1 - 1.0 K/uL   Eosinophils Relative 2 %   Eosinophils Absolute 0.1 0.0 - 0.5 K/uL   Basophils Relative 1 %   Basophils Absolute 0.1 0.0 - 0.1 K/uL   Immature Granulocytes 1 %   Abs Immature Granulocytes 0.06 0.00 - 0.07 K/uL    Comment: Performed at [facility], [address] [place], [place] [address]  Basic metabolic panel     Status: Abnormal   Collection Time: 06/08/20  4:00 AM  Result Value Ref Range   Sodium 141 135 - 145 mmol/L   Potassium 4.1 3.5 - 5.1 mmol/L   Chloride 107 98 - 111 mmol/L   CO2 28 22 - 32 mmol/L   Glucose, Bld 118 (H) 70 - 99 mg/dL    Comment: Glucose reference range applies only to samples taken after fasting for at least 8 hours.   BUN 35 (H) 6 - 20 mg/dL   Creatinine, Ser 1.39 (H) 0.44 - 1.00 mg/dL   Calcium 10.4 (H) 8.9 - 10.3 mg/dL   GFR, Estimated 41 (L) >60 mL/min   Anion  gap 6 5 - 15    Comment: Performed at [facility], [place] [address] [place], [place] [address]  Magnesium     Status: None   Collection Time: 06/08/20  4:00 AM  Result Value Ref Range   Magnesium 2.0 1.7 - 2.4 mg/dL    Comment: Performed at [facility]  [address], [place], [place] [address]  Phosphorus     Status: None   Collection Time: 06/08/20  4:00 AM  Result Value Ref Range   Phosphorus 2.7 2.5 - 4.6 mg/dL    Comment: Performed at [facility] [address], [place], [place] [address]  Glucose, capillary     Status: Abnormal   Collection Time: 06/08/20  8:12 AM  Result Value Ref Range   Glucose-Capillary 114 (H) 70 - 99 mg/dL    Comment: Glucose reference range applies only to samples taken after fasting for at least 8 hours.    Radiology/Results: No results found.  Anti-infectives: Anti-infectives (From admission, onward)   Start     Dose/Rate Route Frequency Ordered Stop   06/07/20 0945  metroNIDAZOLE (FLAGYL) tablet 500 mg        500 mg Oral Every 8 hours 06/07/20 0846     06/06/20 1000  cefdinir (OMNICEF) capsule 300 mg        300 mg Oral Every 12 hours 06/05/20 1955     06/04/20 1830  piperacillin-tazobactam (ZOSYN) IVPB 3.375 g  Status:  Discontinued        3.375 g 12.5 mL/hr over 240 Minutes Intravenous Every 8 hours 06/04/20 1806 06/05/20 1955   06/04/20 0430  piperacillin-tazobactam (ZOSYN) IVPB 3.375 g        3.375 g 100 mL/hr over 30 Minutes Intravenous  Once 06/04/20 0422 06/04/20 0538      Assessment/Plan: Problem List: Patient Active Problem List   Diagnosis Date Noted  . Malnutrition of moderate degree 06/05/2020  . Weakness 06/04/2020  . CAD (coronary artery disease) 06/04/2020  . Macrocytic anemia 06/04/2020  . 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 Intra-abdominal infection   . Acute colitis 05/14/2020  . NSTEMI (non-ST elevated myocardial infarction) ([place]) 11/14/2019  . DM type 2 (diabetes mellitus, type 2) ([place]) 11/14/2019  . Depression   . Anxiety   . Chest pain   . Obesity, Class III, BMI 40-49.9 (morbid obesity) ([place])   . CKD (chronic kidney disease), stage III ([place])   . Psoriatic arthritis ([place])   . Tobacco abuse   . OSA (obstructive sleep  apnea)   . Neuropathy   . Bipolar disorder ([place])     Inprovement in perforated diverticulitis.  * No surgery found *    LOS: 4 days   Matt B. Hassell Done, MD, [facility] [phone] to reach the surgeon on call.    06/08/2020 9:23 AMPatient ID: Valerie [name], female   DOB: 31-May-1961, 59 y.o.   MRN: [id number]

## 2020-06-09 DIAGNOSIS — N390 Urinary tract infection, site not specified: Secondary | ICD-10-CM | POA: Diagnosis not present

## 2020-06-09 LAB — CULTURE, BLOOD (ROUTINE X 2)
Culture: NO GROWTH
Culture: NO GROWTH

## 2020-06-09 LAB — CBC WITH DIFFERENTIAL/PLATELET
Abs Immature Granulocytes: 0.09 10*3/uL — ABNORMAL HIGH (ref 0.00–0.07)
Basophils Absolute: 0.1 10*3/uL (ref 0.0–0.1)
Basophils Relative: 1 %
Eosinophils Absolute: 0.2 10*3/uL (ref 0.0–0.5)
Eosinophils Relative: 2 %
HCT: 26.9 % — ABNORMAL LOW (ref 36.0–46.0)
Hemoglobin: 8.1 g/dL — ABNORMAL LOW (ref 12.0–15.0)
Immature Granulocytes: 1 %
Lymphocytes Relative: 17 %
Lymphs Abs: 1.2 10*3/uL (ref 0.7–4.0)
MCH: 33.5 pg (ref 26.0–34.0)
MCHC: 30.1 g/dL (ref 30.0–36.0)
MCV: 111.2 fL — ABNORMAL HIGH (ref 80.0–100.0)
Monocytes Absolute: 0.7 10*3/uL (ref 0.1–1.0)
Monocytes Relative: 9 %
Neutro Abs: 5.1 10*3/uL (ref 1.7–7.7)
Neutrophils Relative %: 70 %
Platelets: 199 10*3/uL (ref 150–400)
RBC: 2.42 MIL/uL — ABNORMAL LOW (ref 3.87–5.11)
RDW: 14.4 % (ref 11.5–15.5)
WBC: 7.3 10*3/uL (ref 4.0–10.5)
nRBC: 0.3 % — ABNORMAL HIGH (ref 0.0–0.2)

## 2020-06-09 LAB — COMPREHENSIVE METABOLIC PANEL
ALT: 16 U/L (ref 0–44)
AST: 16 U/L (ref 15–41)
Albumin: 2.3 g/dL — ABNORMAL LOW (ref 3.5–5.0)
Alkaline Phosphatase: 65 U/L (ref 38–126)
Anion gap: 4 — ABNORMAL LOW (ref 5–15)
BUN: 41 mg/dL — ABNORMAL HIGH (ref 6–20)
CO2: 26 mmol/L (ref 22–32)
Calcium: 10.1 mg/dL (ref 8.9–10.3)
Chloride: 111 mmol/L (ref 98–111)
Creatinine, Ser: 1.36 mg/dL — ABNORMAL HIGH (ref 0.44–1.00)
GFR, Estimated: 42 mL/min — ABNORMAL LOW (ref >60)
Glucose, Bld: 148 mg/dL — ABNORMAL HIGH (ref 70–99)
Potassium: 3.9 mmol/L (ref 3.5–5.1)
Sodium: 141 mmol/L (ref 135–145)
Total Bilirubin: 0.4 mg/dL (ref 0.3–1.2)
Total Protein: 4.6 g/dL — ABNORMAL LOW (ref 6.5–8.1)

## 2020-06-09 LAB — GLUCOSE, CAPILLARY
Glucose-Capillary: 118 mg/dL — ABNORMAL HIGH (ref 70–99)
Glucose-Capillary: 133 mg/dL — ABNORMAL HIGH (ref 70–99)
Glucose-Capillary: 141 mg/dL — ABNORMAL HIGH (ref 70–99)
Glucose-Capillary: 141 mg/dL — ABNORMAL HIGH (ref 70–99)
Glucose-Capillary: 154 mg/dL — ABNORMAL HIGH (ref 70–99)
Glucose-Capillary: 172 mg/dL — ABNORMAL HIGH (ref 70–99)

## 2020-06-09 LAB — PHOSPHORUS: Phosphorus: 2.5 mg/dL (ref 2.5–4.6)

## 2020-06-09 LAB — MAGNESIUM: Magnesium: 2 mg/dL (ref 1.7–2.4)

## 2020-06-09 MED ORDER — TRAVASOL 10 % IV SOLN
INTRAVENOUS | Status: AC
  Filled 2020-06-09: qty 1154.4

## 2020-06-09 MED ORDER — INSULIN ASPART 100 UNIT/ML ~~LOC~~ SOLN
0.0000 [IU] | SUBCUTANEOUS | Status: DC
  Administered 2020-06-09: 2 [IU] via SUBCUTANEOUS
  Administered 2020-06-09: 1 [IU] via SUBCUTANEOUS
  Administered 2020-06-10: 2 [IU] via SUBCUTANEOUS
  Administered 2020-06-10: 3 [IU] via SUBCUTANEOUS
  Administered 2020-06-11: 2 [IU] via SUBCUTANEOUS
  Administered 2020-06-11 (×2): 1 [IU] via SUBCUTANEOUS
  Administered 2020-06-12 – 2020-06-13 (×4): 2 [IU] via SUBCUTANEOUS

## 2020-06-09 NOTE — Progress Notes (Signed)
PROGRESS NOTE    Valerie [name]  WPY:[id number] DOB: 1960-10-23 DOA: 06/03/2020 PCP: Otelia Santee I, MD   Brief Narrative: Valerie [name] is a 59 y.o. female with a history of diabetes mellitus, type II, CKD stage IIIb, peripheral neuropathy, psoriatic arthritis, obstructive sleep apnea, CAD, colovesical fistula.  Patient presented secondary to worsening weakness with concern for urinary tract infection.  She was started on empiric antibiotics with cultures obtained.   Assessment & Plan:   Active Problems:   Anxiety   Psoriatic arthritis (HCC)   OSA (obstructive sleep apnea)   Bipolar disorder (HCC)   DM type 2 (diabetes mellitus, type 2) (HCC)   Colovesical fistula   Weakness   CAD (coronary artery disease)   Macrocytic anemia   Malnutrition of moderate degree   Urinary tract infection Patient empirically started on Zosyn IV for treatment.  Urine culture significant for E. Coli with sensitivities available.  In setting of colovesical fistula. Discussed with ID and recommending 7 day total treatment course with refill available for recurrent UTI symptoms. -Cefdinir 300 mg twice daily  Diverticulitis -Cefdinir as above in addition to Flagyl to treat for 10-14 days total  Weakness Concern on admission was related to infection in setting of colovesical history of versus glucocorticosteroid withdrawal. Physical therapy recommending SNF.  Colovesical fistula Patient followed by general surgery as an outpatient with plan for laparoscopic management. -General surgery recommendations: Following this admission. Plan for outpatient management  Psoriatic arthritis Patient has been managed with methotrexate, prednisone, Plaquenil, Enbrel which were held on previous discharge.  Patient's prednisone was decreased to 5 mg daily at discharge as well.  Prednisone was continued at 10 mg dose this admission.  Macrocytic anemia Trending down slowly.  Folate and vitamin B12 levels ordered on  admission and are normal.  -Repeat CBC  Coronary artery disease Patient with history of NSTEMI.  Patient follows with cardiology as outpatient. -Continue aspirin, nadolol, Crestor  Memory impairment Patient is aware of her memory deficits.  Unsure if this is related to dementia versus other cognitive impairment.  Patient is currently on donepezil as an outpatient -Continue donepezil  Diabetes mellitus, type II Patient is not on medication therapy as an outpatient.  Complicated by prednisone use.  Last hemoglobin A1c of 4.9% obtained this admission.  Patient is currently now on TPN. -Continue sliding scale insulin  CKD stage IIIb Baseline creatinine of 1.6-1.8.  Patient currently at baseline.  Moderate malnutrition Dietitian consulted.  Patient started on TPN per outpatient plan by PCP.  Patient presented with PICC line in place. -Continue TPN   DVT prophylaxis: Lovenox Code Status:   Code Status: Full Code Family Communication: None at bedside Disposition Plan: Discharge to SNF pending bed availability. Likely in 1-3 days   Consultants:   General surgery  Procedures:   None  Antimicrobials:  Zosyn IV   Cefdinir  Flagyl   Subjective: No reports overnight  Objective: Vitals:   06/08/20 1545 06/08/20 2048 06/09/20 0101 06/09/20 0449  BP: 96/64 (!) 117/56 120/60 119/61  Pulse: (!) 58 (!) 53 60 61  Resp: 18 18 18 18   Temp: 98.2 F (36.8 C) 98 F (36.7 C) 98.1 F (36.7 C) 98.2 F (36.8 C)  TempSrc: Oral Oral Oral Oral  SpO2: 98% 99% 99% 99%  Weight:      Height:        Intake/Output Summary (Last 24 hours) at 06/09/2020 1217 Last data filed at 06/09/2020 0859 Gross per 24 hour  Intake 540  ml  Output --  Net 540 ml   Filed Weights   06/03/20 1315 06/04/20 0342  Weight: 90.3 kg 90.3 kg    Examination:  General: Well appearing, no distress        Data Reviewed: I have personally reviewed following labs and imaging studies  CBC Lab Results   Component Value Date   WBC 7.3 06/09/2020   RBC 2.42 (L) 06/09/2020   HGB 8.1 (L) 06/09/2020   HCT 26.9 (L) 06/09/2020   MCV 111.2 (H) 06/09/2020   MCH 33.5 06/09/2020   PLT 199 06/09/2020   MCHC 30.1 06/09/2020   RDW 14.4 06/09/2020   LYMPHSABS 1.2 06/09/2020   MONOABS 0.7 06/09/2020   EOSABS 0.2 06/09/2020   BASOSABS 0.1 54/00/8676     Last metabolic panel Lab Results  Component Value Date   NA 141 06/09/2020   K 3.9 06/09/2020   CL 111 06/09/2020   CO2 26 06/09/2020   BUN 41 (H) 06/09/2020   CREATININE 1.36 (H) 06/09/2020   GLUCOSE 148 (H) 06/09/2020   GFRNONAA 42 (L) 06/09/2020   GFRAA 38 (L) 05/15/2020   CALCIUM 10.1 06/09/2020   PHOS 2.5 06/09/2020   PROT 4.6 (L) 06/09/2020   ALBUMIN 2.3 (L) 06/09/2020   BILITOT 0.4 06/09/2020   ALKPHOS 65 06/09/2020   AST 16 06/09/2020   ALT 16 06/09/2020   ANIONGAP 4 (L) 06/09/2020    CBG (last 3)  Recent Labs    06/09/20 0454 06/09/20 0745 06/09/20 1213  GLUCAP 141* 133* 118*     GFR: Estimated Creatinine Clearance: 46 mL/min (A) (by C-G formula based on SCr of 1.36 mg/dL (H)).  Coagulation Profile: No results for input(s): INR, PROTIME in the last 168 hours.  Recent Results (from the past 240 hour(s))  Respiratory Panel by RT PCR (Flu A&B, Covid) - Nasopharyngeal Swab     Status: None   Collection Time: 06/04/20  5:06 AM   Specimen: Nasopharyngeal Swab  Result Value Ref Range Status   SARS Coronavirus 2 by RT PCR NEGATIVE NEGATIVE Final    Comment: (NOTE) SARS-CoV-2 target nucleic acids are NOT DETECTED.  The SARS-CoV-2 RNA is generally detectable in upper respiratoy specimens during the acute phase of infection. The lowest concentration of SARS-CoV-2 viral copies this assay can detect is 131 copies/mL. A negative result does not preclude SARS-Cov-2 infection and should not be used as the sole basis for treatment or other patient management decisions. A negative result may occur with  improper specimen  collection/handling, submission of specimen other than nasopharyngeal swab, presence of viral mutation(s) within the areas targeted by this assay, and inadequate number of viral copies (<131 copies/mL). A negative result must be combined with clinical observations, patient history, and epidemiological information. The expected result is Negative.  Fact Sheet for Patients:  PinkCheek.be  Fact Sheet for Healthcare Providers:  GravelBags.it  This test is no t yet approved or cleared by the [place] FDA and  has been authorized for detection and/or diagnosis of SARS-CoV-2 by FDA under an Emergency Use Authorization (EUA). This EUA will remain  in effect (meaning this test can be used) for the duration of the COVID-19 declaration under Section 564(b)(1) of the Act, 21 U.S.C. section 360bbb-3(b)(1), unless the authorization is terminated or revoked sooner.     Influenza A by PCR NEGATIVE NEGATIVE Final   Influenza B by PCR NEGATIVE NEGATIVE Final    Comment: (NOTE) The Xpert Xpress SARS-CoV-2/FLU/RSV assay is intended as an aid  in  the diagnosis of influenza from Nasopharyngeal swab specimens and  should not be used as a sole basis for treatment. Nasal washings and  aspirates are unacceptable for Xpert Xpress SARS-CoV-2/FLU/RSV  testing.  Fact Sheet for Patients: PinkCheek.be  Fact Sheet for Healthcare Providers: GravelBags.it  This test is not yet approved or cleared by the [place] FDA and  has been authorized for detection and/or diagnosis of SARS-CoV-2 by  FDA under an Emergency Use Authorization (EUA). This EUA will remain  in effect (meaning this test can be used) for the duration of the  Covid-19 declaration under Section 564(b)(1) of the Act, 21  U.S.C. section 360bbb-3(b)(1), unless the authorization is  terminated or revoked. Performed at [facility], [place] [address], [place], [place] [address]   Urine culture     Status: Abnormal   Collection Time: 06/04/20  6:08 AM   Specimen: Urine, Random  Result Value Ref Range Status   Specimen Description URINE, RANDOM  Final   Special Requests   Final    NONE Performed at [facility], [place] [address] [place], [place] [address]    Culture >=100,000 COLONIES/mL ESCHERICHIA COLI (A)  Final   Report Status 06/05/2020 FINAL  Final   Organism ID, Bacteria ESCHERICHIA COLI (A)  Final      Susceptibility   Escherichia coli - MIC*    AMPICILLIN >=32 RESISTANT Resistant     CEFAZOLIN 16 SENSITIVE Sensitive     CEFTRIAXONE <=0.25 SENSITIVE Sensitive     CIPROFLOXACIN 1 SENSITIVE Sensitive     GENTAMICIN <=1 SENSITIVE Sensitive     IMIPENEM <=0.25 SENSITIVE Sensitive     NITROFURANTOIN <=16 SENSITIVE Sensitive     TRIMETH/SULFA <=20 SENSITIVE Sensitive     AMPICILLIN/SULBACTAM >=32 RESISTANT Resistant     PIP/TAZO <=4 SENSITIVE Sensitive     * >=100,000 COLONIES/mL ESCHERICHIA COLI  Blood culture (routine x 2)     Status: None   Collection Time: 06/04/20  8:23 AM   Specimen: BLOOD  Result Value Ref Range Status   Specimen Description BLOOD RIGHT ANTECUBITAL  Final   Special Requests   Final    BOTTLES DRAWN AEROBIC ONLY Blood Culture results may not be optimal due to an inadequate volume of blood received in culture bottles   Culture   Final    NO GROWTH 5 DAYS Performed at [facility], [place] [address], [place], [place] [address]    Report Status 06/09/2020 FINAL  Final  Blood culture (routine x 2)     Status: None   Collection Time: 06/04/20  8:59 AM   Specimen: BLOOD LEFT HAND  Result Value Ref Range Status   Specimen Description BLOOD LEFT HAND  Final   Special Requests   Final    BOTTLES DRAWN AEROBIC ONLY Blood Culture results may not be optimal due to an inadequate volume of blood received in culture bottles   Culture   Final    NO GROWTH 5  DAYS Performed at [facility], [place] [address], [place], [place] [address]    Report Status 06/09/2020 FINAL  Final        Radiology Studies: No results found.      Scheduled Meds: . aspirin EC  81 mg Oral Daily  . cefdinir  300 mg Oral Q12H  . Chlorhexidine Gluconate Cloth  6 each Topical Daily  . donepezil  10 mg Oral Daily  . enoxaparin (LOVENOX) injection  40 mg Subcutaneous Q24H  . escitalopram  30 mg Oral Daily  . insulin aspart  0-9 Units Subcutaneous 4 times per day  . lamoTRIgine  150 mg Oral QHS  . lamoTRIgine  75 mg Oral Daily  . metroNIDAZOLE  500 mg Oral Q8H  . nadolol  10 mg Oral Daily  . nystatin   Topical TID  . predniSONE  10 mg Oral Q breakfast  . rosuvastatin  5 mg Oral Daily  . senna-docusate  1 tablet Oral BID  . sodium chloride flush  10-40 mL Intracatheter Q12H  . traZODone  200 mg Oral QHS   Continuous Infusions: . TPN CYCLIC-ADULT (ION)       LOS: 5 days     Cordelia Poche, MD [facility] 06/09/2020, 12:17 PM  If 7PM-7AM, please contact night-coverage [email or web address]

## 2020-06-09 NOTE — Progress Notes (Signed)
Dr. Lonny Prude notified of below vitial signs    06/09/20 1444  Vitals  Temp 98.4 F (36.9 C)  Temp Source Oral  BP (!) 89/58  MAP (mmHg) 69  BP Location Left Arm  BP Method Automatic  Patient Position (if appropriate) Sitting  Pulse Rate (!) 57  Pulse Rate Source Monitor  Resp 19  MEWS COLOR  MEWS Score Color Green  Oxygen Therapy  SpO2 98 %  O2 Device Room Air  MEWS Score  MEWS Temp 0  MEWS Systolic 1  MEWS Pulse 0  MEWS RR 0  MEWS LOC 0  MEWS Score 1

## 2020-06-09 NOTE — Progress Notes (Signed)
Patient ID: Valerie [name], female   DOB: 07-Oct-1960, 59 y.o.   MRN: [id number] [facility] Progress Note:   * No surgery found *  Subjective: Mental status is clear and awake.  Complaints she has seen some more bubbles in her urine. Objective: Vital signs in last 24 hours: Temp:  [98 F (36.7 C)-98.2 F (36.8 C)] 98.2 F (36.8 C) (10/17 0449) Pulse Rate:  [53-61] 61 (10/17 0449) Resp:  [18] 18 (10/17 0449) BP: (96-120)/(56-64) 119/61 (10/17 0449) SpO2:  [98 %-99 %] 99 % (10/17 0449)  Intake/Output from previous day: 10/16 0701 - 10/17 0700 In: 320 [P.O.:300; I.V.:20] Out: -  Intake/Output this shift: No intake/output data recorded.  Physical Exam: Work of breathing is not elevated;  No abdominal pain.  Has noticed some pneumaturia.   Lab Results:  Results for orders placed or performed during the hospital encounter of 06/03/20 (from the past 48 hour(s))  Glucose, capillary     Status: Abnormal   Collection Time: 06/07/20  9:40 PM  Result Value Ref Range   Glucose-Capillary 127 (H) 70 - 99 mg/dL    Comment: Glucose reference range applies only to samples taken after fasting for at least 8 hours.  CBC with Differential/Platelet     Status: Abnormal   Collection Time: 06/08/20  4:00 AM  Result Value Ref Range   WBC 7.1 4.0 - 10.5 K/uL   RBC 2.52 (L) 3.87 - 5.11 MIL/uL   Hemoglobin 8.6 (L) 12.0 - 15.0 g/dL   HCT 27.9 (L) 36 - 46 %   MCV 110.7 (H) 80.0 - 100.0 fL   MCH 34.1 (H) 26.0 - 34.0 pg   MCHC 30.8 30.0 - 36.0 g/dL   RDW 14.3 11.5 - 15.5 %   Platelets 194 150 - 400 K/uL   nRBC 0.0 0.0 - 0.2 %   Neutrophils Relative % 71 %   Neutro Abs 5.1 1.7 - 7.7 K/uL   Lymphocytes Relative 16 %   Lymphs Abs 1.1 0.7 - 4.0 K/uL   Monocytes Relative 9 %   Monocytes Absolute 0.6 0.1 - 1.0 K/uL   Eosinophils Relative 2 %   Eosinophils Absolute 0.1 0.0 - 0.5 K/uL   Basophils Relative 1 %   Basophils Absolute 0.1 0.0 - 0.1 K/uL   Immature Granulocytes 1 %   Abs Immature  Granulocytes 0.06 0.00 - 0.07 K/uL    Comment: Performed at [facility], [address] [place], [place] [address]  Basic metabolic panel     Status: Abnormal   Collection Time: 06/08/20  4:00 AM  Result Value Ref Range   Sodium 141 135 - 145 mmol/L   Potassium 4.1 3.5 - 5.1 mmol/L   Chloride 107 98 - 111 mmol/L   CO2 28 22 - 32 mmol/L   Glucose, Bld 118 (H) 70 - 99 mg/dL    Comment: Glucose reference range applies only to samples taken after fasting for at least 8 hours.   BUN 35 (H) 6 - 20 mg/dL   Creatinine, Ser 1.39 (H) 0.44 - 1.00 mg/dL   Calcium 10.4 (H) 8.9 - 10.3 mg/dL   GFR, Estimated 41 (L) >60 mL/min   Anion gap 6 5 - 15    Comment: Performed at [facility] [address], [place], [place] [address]  Magnesium     Status: None   Collection Time: 06/08/20  4:00 AM  Result Value Ref Range   Magnesium 2.0 1.7 - 2.4  mg/dL    Comment: Performed at [facility], [place] [address], [place], [place] [address]  Phosphorus     Status: None   Collection Time: 06/08/20  4:00 AM  Result Value Ref Range   Phosphorus 2.7 2.5 - 4.6 mg/dL    Comment: Performed at [facility] [address] [place], [place] [address]  Glucose, capillary     Status: Abnormal   Collection Time: 06/08/20  8:12 AM  Result Value Ref Range   Glucose-Capillary 114 (H) 70 - 99 mg/dL    Comment: Glucose reference range applies only to samples taken after fasting for at least 8 hours.  Glucose, capillary     Status: Abnormal   Collection Time: 06/08/20 12:22 PM  Result Value Ref Range   Glucose-Capillary 112 (H) 70 - 99 mg/dL    Comment: Glucose reference range applies only to samples taken after fasting for at least 8 hours.  Glucose, capillary     Status: Abnormal   Collection Time: 06/08/20  5:27 PM  Result Value Ref Range   Glucose-Capillary 170 (H) 70 - 99 mg/dL    Comment: Glucose reference range applies only to samples taken after fasting for at least 8 hours.  Glucose,  capillary     Status: Abnormal   Collection Time: 06/08/20  8:46 PM  Result Value Ref Range   Glucose-Capillary 190 (H) 70 - 99 mg/dL    Comment: Glucose reference range applies only to samples taken after fasting for at least 8 hours.  Glucose, capillary     Status: Abnormal   Collection Time: 06/08/20 11:31 PM  Result Value Ref Range   Glucose-Capillary 153 (H) 70 - 99 mg/dL    Comment: Glucose reference range applies only to samples taken after fasting for at least 8 hours.  CBC with Differential/Platelet     Status: Abnormal   Collection Time: 06/09/20  4:14 AM  Result Value Ref Range   WBC 7.3 4.0 - 10.5 K/uL   RBC 2.42 (L) 3.87 - 5.11 MIL/uL   Hemoglobin 8.1 (L) 12.0 - 15.0 g/dL   HCT 26.9 (L) 36 - 46 %   MCV 111.2 (H) 80.0 - 100.0 fL   MCH 33.5 26.0 - 34.0 pg   MCHC 30.1 30.0 - 36.0 g/dL   RDW 14.4 11.5 - 15.5 %   Platelets 199 150 - 400 K/uL   nRBC 0.3 (H) 0.0 - 0.2 %   Neutrophils Relative % 70 %   Neutro Abs 5.1 1.7 - 7.7 K/uL   Lymphocytes Relative 17 %   Lymphs Abs 1.2 0.7 - 4.0 K/uL   Monocytes Relative 9 %   Monocytes Absolute 0.7 0.1 - 1.0 K/uL   Eosinophils Relative 2 %   Eosinophils Absolute 0.2 0.0 - 0.5 K/uL   Basophils Relative 1 %   Basophils Absolute 0.1 0.0 - 0.1 K/uL   Immature Granulocytes 1 %   Abs Immature Granulocytes 0.09 (H) 0.00 - 0.07 K/uL   Polychromasia PRESENT     Comment: Performed at [facility], [address], [place], [place] [address]  Magnesium     Status: None   Collection Time: 06/09/20  4:14 AM  Result Value Ref Range   Magnesium 2.0 1.7 - 2.4 mg/dL    Comment: Performed at [facility] [address] [place], [place] [address]  Phosphorus     Status: None   Collection Time: 06/09/20  4:14 AM  Result  Value Ref Range   Phosphorus 2.5 2.5 - 4.6 mg/dL    Comment: Performed at [facility] [address], [place], [place] [address]  Comprehensive metabolic panel     Status: Abnormal   Collection Time:  06/09/20  4:14 AM  Result Value Ref Range   Sodium 141 135 - 145 mmol/L   Potassium 3.9 3.5 - 5.1 mmol/L   Chloride 111 98 - 111 mmol/L   CO2 26 22 - 32 mmol/L   Glucose, Bld 148 (H) 70 - 99 mg/dL    Comment: Glucose reference range applies only to samples taken after fasting for at least 8 hours.   BUN 41 (H) 6 - 20 mg/dL   Creatinine, Ser 1.36 (H) 0.44 - 1.00 mg/dL   Calcium 10.1 8.9 - 10.3 mg/dL   Total Protein 4.6 (L) 6.5 - 8.1 g/dL   Albumin 2.3 (L) 3.5 - 5.0 g/dL   AST 16 15 - 41 U/L   ALT 16 0 - 44 U/L   Alkaline Phosphatase 65 38 - 126 U/L   Total Bilirubin 0.4 0.3 - 1.2 mg/dL   GFR, Estimated 42 (L) >60 mL/min   Anion gap 4 (L) 5 - 15    Comment: Performed at [facility] [address], [place], [place] [address]  Glucose, capillary     Status: Abnormal   Collection Time: 06/09/20  4:54 AM  Result Value Ref Range   Glucose-Capillary 141 (H) 70 - 99 mg/dL    Comment: Glucose reference range applies only to samples taken after fasting for at least 8 hours.  Glucose, capillary     Status: Abnormal   Collection Time: 06/09/20  7:45 AM  Result Value Ref Range   Glucose-Capillary 133 (H) 70 - 99 mg/dL    Comment: Glucose reference range applies only to samples taken after fasting for at least 8 hours.    Radiology/Results: No results found.  Anti-infectives: Anti-infectives (From admission, onward)   Start     Dose/Rate Route Frequency Ordered Stop   06/07/20 0945  metroNIDAZOLE (FLAGYL) tablet 500 mg        500 mg Oral Every 8 hours 06/07/20 0846     06/06/20 1000  cefdinir (OMNICEF) capsule 300 mg        300 mg Oral Every 12 hours 06/05/20 1955     06/04/20 1830  piperacillin-tazobactam (ZOSYN) IVPB 3.375 g  Status:  Discontinued        3.375 g 12.5 mL/hr over 240 Minutes Intravenous Every 8 hours 06/04/20 1806 06/05/20 1955   06/04/20 0430  piperacillin-tazobactam (ZOSYN) IVPB 3.375 g        3.375 g 100 mL/hr over 30 Minutes Intravenous  Once 06/04/20 0422  06/04/20 0538      Assessment/Plan: Problem List: Patient Active Problem List   Diagnosis Date Noted  . Malnutrition of moderate degree 06/05/2020  . Weakness 06/04/2020  . CAD (coronary artery disease) 06/04/2020  . Macrocytic anemia 06/04/2020  . 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 Intra-abdominal infection   . Acute colitis 05/14/2020  . NSTEMI (non-ST elevated myocardial infarction) ([place]) 11/14/2019  . DM type 2 (diabetes mellitus, type 2) ([place]) 11/14/2019  . Depression   . Anxiety   . Chest pain   . Obesity, Class III, BMI 40-49.9 (morbid obesity) ([place])   . CKD (chronic kidney disease), stage III ([place])   . Psoriatic arthritis ([place])   . Tobacco  abuse   . OSA (obstructive sleep apnea)   . Neuropathy   . Bipolar disorder ([place])     Pneumaturia is not necessarily a reason to push forward with immediate surgery.  I would suggest letting her inflammation mature and then try for a one stage resection and closure of colovesical fistula.   * No surgery found *    LOS: 5 days   Matt B. Hassell Done, MD, [facility] [phone] to reach the surgeon on call.    06/09/2020 8:33 AM

## 2020-06-09 NOTE — Progress Notes (Signed)
Patient has home CPAP at bedside. RT added distilled H20 to chamber for humidity per patient request. Patient states she is able to place herself on/off as needed. Aware to call for assistance if needed.

## 2020-06-09 NOTE — Progress Notes (Signed)
PHARMACY - TOTAL PARENTERAL NUTRITION CONSULT NOTE   Indication: Colovesicular Fistula  Patient Measurements: Height: 5\' 2"  (157.5 cm) Weight: 90.3 kg (199 lb) IBW/kg (Calculated) : 50.1 TPN AdjBW (KG): 60.1 Body mass index is 36.4 kg/m. Usual Weight: 104kg  Assessment:  59 yo W recently admitted 05/14/25 for fistula now readmitted with worsening weakness, falls, lethargy, nausea, confusion and poor PO intake. PICC was placed outpatient 10/7 for TPN with first TPN to start 10/12. Plan for colectomy in 4-6 weeks after weaning off immunosuppresants. Poor PO intake since ~05/08/20 with documented weight loss of ~14% over 3-6 months.  Glucose / Insulin: CBGs controlled, utilized 4 units SSI in last 24hrs. On chronic prednisone 10mg . A1C 4.9 - no meds PTA.  Electrolytes: all WNL except corrected Ca high ~11.3 (improved) Renal: AKI improving - Scr 1.36 stable, BUN up to 41 LFTs / TGs: LFTs / Tbili / TG WNL Prealbumin / albumin: Prealbumin 15.6; albumin 2.1 Intake / Output; MIVF: UOP inaccurate, LBM 10/16 GI Imaging: 9/21 CT Abd: Large stool burden, possible colitis/diverticulitis, highly suspicious colovesicular fistula  10/12 CT Abd: L colonic diverticulitis, likely colovesical fistula and paracolonic abscess Surgeries / Procedures: 9/24 Flex sig: sigmoid stricture and diverticulosis   Central access: 10/7 PICC  TPN start date: 10/12   Nutritional Goals (per RD recommendation 10/13) kCal: 1800-2000, Protein: 110-125g, Fluid: > 1.8 L/d Goal TPN rate is 80 mL/hr (provides 115g of protein and 1970 kcals per day)   Current Nutrition:  TPN, regular diet- eating 75-100% x2 meals per documentation  Plan:  Advance to 41-OI cyclic TPN at 7867 with CBGs controlled <200. Cycle 1924 ml over 14hrs: 87 ml/hr x 1 hr; then 175 ml/hr x 10 hrs; then 87 ml/hr x 1 hr Will provide 115g AA, 269g Dextrose, 60g Lipid, and 1974 kCal; meeting 100% of patient protein and kCal needs Electrolytes in TPN:  Continue 75 mEq/L of Na, 28 mEq/L of K; 0 mEq/L of Ca, 4mEq/L of Mg, 17 mmol/L of Phos; Cl:Ac ratio 1:1 Watch high Ca (improved today) Add standard MVI and trace elements to TPN Continue custom cyclic 4x daily Sensitive SSI and adjust as needed Monitor TPN labs, PO intake and ability to wean TPN Surgery planning to continue TPN at SNF when bed available   Arturo Morton, PharmD, BCPS Please check AMION for all [facility] contact numbers Clinical Pharmacist 06/09/2020 7:51 AM

## 2020-06-10 DIAGNOSIS — E1140 Type 2 diabetes mellitus with diabetic neuropathy, unspecified: Secondary | ICD-10-CM | POA: Diagnosis not present

## 2020-06-10 DIAGNOSIS — E440 Moderate protein-calorie malnutrition: Secondary | ICD-10-CM | POA: Diagnosis not present

## 2020-06-10 DIAGNOSIS — N321 Vesicointestinal fistula: Secondary | ICD-10-CM | POA: Diagnosis not present

## 2020-06-10 DIAGNOSIS — N390 Urinary tract infection, site not specified: Secondary | ICD-10-CM | POA: Diagnosis not present

## 2020-06-10 LAB — COMPREHENSIVE METABOLIC PANEL
ALT: 17 U/L (ref 0–44)
AST: 18 U/L (ref 15–41)
Albumin: 2.4 g/dL — ABNORMAL LOW (ref 3.5–5.0)
Alkaline Phosphatase: 64 U/L (ref 38–126)
Anion gap: 8 (ref 5–15)
BUN: 45 mg/dL — ABNORMAL HIGH (ref 6–20)
CO2: 23 mmol/L (ref 22–32)
Calcium: 9.8 mg/dL (ref 8.9–10.3)
Chloride: 112 mmol/L — ABNORMAL HIGH (ref 98–111)
Creatinine, Ser: 1.45 mg/dL — ABNORMAL HIGH (ref 0.44–1.00)
GFR, Estimated: 39 mL/min — ABNORMAL LOW (ref >60)
Glucose, Bld: 145 mg/dL — ABNORMAL HIGH (ref 70–99)
Potassium: 3.8 mmol/L (ref 3.5–5.1)
Sodium: 143 mmol/L (ref 135–145)
Total Bilirubin: 0.5 mg/dL (ref 0.3–1.2)
Total Protein: 4.9 g/dL — ABNORMAL LOW (ref 6.5–8.1)

## 2020-06-10 LAB — CBC
HCT: 27.6 % — ABNORMAL LOW (ref 36.0–46.0)
Hemoglobin: 8.4 g/dL — ABNORMAL LOW (ref 12.0–15.0)
MCH: 34 pg (ref 26.0–34.0)
MCHC: 30.4 g/dL (ref 30.0–36.0)
MCV: 111.7 fL — ABNORMAL HIGH (ref 80.0–100.0)
Platelets: 208 10*3/uL (ref 150–400)
RBC: 2.47 MIL/uL — ABNORMAL LOW (ref 3.87–5.11)
RDW: 14.7 % (ref 11.5–15.5)
WBC: 7.8 10*3/uL (ref 4.0–10.5)
nRBC: 0.4 % — ABNORMAL HIGH (ref 0.0–0.2)

## 2020-06-10 LAB — PHOSPHORUS: Phosphorus: 3.1 mg/dL (ref 2.5–4.6)

## 2020-06-10 LAB — TRIGLYCERIDES: Triglycerides: 146 mg/dL (ref <150)

## 2020-06-10 LAB — PREALBUMIN: Prealbumin: 26.8 mg/dL (ref 18–38)

## 2020-06-10 LAB — GLUCOSE, CAPILLARY
Glucose-Capillary: 109 mg/dL — ABNORMAL HIGH (ref 70–99)
Glucose-Capillary: 127 mg/dL — ABNORMAL HIGH (ref 70–99)
Glucose-Capillary: 158 mg/dL — ABNORMAL HIGH (ref 70–99)
Glucose-Capillary: 201 mg/dL — ABNORMAL HIGH (ref 70–99)
Glucose-Capillary: 85 mg/dL (ref 70–99)

## 2020-06-10 LAB — MAGNESIUM: Magnesium: 2.1 mg/dL (ref 1.7–2.4)

## 2020-06-10 MED ORDER — TRAVASOL 10 % IV SOLN
INTRAVENOUS | Status: AC
  Filled 2020-06-10: qty 1154.4

## 2020-06-10 MED ORDER — PROCHLORPERAZINE EDISYLATE 10 MG/2ML IJ SOLN
10.0000 mg | Freq: Once | INTRAMUSCULAR | Status: AC
  Administered 2020-06-10: 10 mg via INTRAVENOUS
  Filled 2020-06-10: qty 2

## 2020-06-10 MED ORDER — ALUM & MAG HYDROXIDE-SIMETH 200-200-20 MG/5ML PO SUSP
30.0000 mL | Freq: Four times a day (QID) | ORAL | Status: DC | PRN
  Administered 2020-06-10 – 2020-06-11 (×2): 30 mL via ORAL
  Filled 2020-06-10 (×2): qty 30

## 2020-06-10 NOTE — Progress Notes (Signed)
PROGRESS NOTE    Valerie [name]  CBS:[id number] DOB: 06/19/1961 DOA: 06/03/2020 PCP: Otelia Santee I, MD   Brief Narrative: Valerie [name] is a 59 y.o. female with a history of diabetes mellitus, type II, CKD stage IIIb, peripheral neuropathy, psoriatic arthritis, obstructive sleep apnea, CAD, colovesical fistula.  Patient presented secondary to worsening weakness with concern for urinary tract infection.  She was started on empiric antibiotics with cultures obtained.   Assessment & Plan:   Active Problems:   Anxiety   Psoriatic arthritis (HCC)   OSA (obstructive sleep apnea)   Bipolar disorder (HCC)   DM type 2 (diabetes mellitus, type 2) (HCC)   Colovesical fistula   Weakness   CAD (coronary artery disease)   Macrocytic anemia   Malnutrition of moderate degree   Urinary tract infection Patient empirically started on Zosyn IV for treatment.  Urine culture significant for E. Coli with sensitivities available.  In setting of colovesical fistula. Discussed with ID and recommending 7 day total treatment course with refill available for recurrent UTI symptoms. -Cefdinir 300 mg twice daily  Diverticulitis -Cefdinir as above in addition to Flagyl to treat for 10-14 days total  Weakness Concern on admission was related to infection in setting of colovesical history of versus glucocorticosteroid withdrawal. Physical therapy recommending SNF.  Colovesical fistula Patient followed by general surgery as an outpatient with plan for laparoscopic management. -General surgery recommendations: Following this admission. Plan for outpatient management  Psoriatic arthritis Patient has been managed with methotrexate, prednisone, Plaquenil, Enbrel which were held on previous discharge.  Patient's prednisone was decreased to 5 mg daily at discharge as well.  Prednisone was continued at 10 mg dose this admission.  Macrocytic anemia Trended down slowly.  Folate and vitamin B12 levels ordered on  admission and are normal. CBC stable. Hemoglobin on admission of 11 with baseline of around 13. Trended down to 8.4 -FOBT  Coronary artery disease Patient with history of NSTEMI.  Patient follows with cardiology as outpatient. -Continue aspirin, nadolol, Crestor  Memory impairment Patient is aware of her memory deficits.  Unsure if this is related to dementia versus other cognitive impairment.  Patient is currently on donepezil as an outpatient -Continue donepezil  Diabetes mellitus, type II Patient is not on medication therapy as an outpatient.  Complicated by prednisone use.  Last hemoglobin A1c of 4.9% obtained this admission.  Patient is currently now on TPN. -Continue sliding scale insulin  CKD stage IIIb Baseline creatinine of 1.6-1.8.  Patient currently at baseline.  Moderate malnutrition Dietitian consulted.  Patient started on TPN per outpatient plan by PCP.  Patient presented with PICC line in place. -Continue TPN   DVT prophylaxis: Lovenox Code Status:   Code Status: Full Code Family Communication: None at bedside Disposition Plan: Discharge to SNF pending bed availability. Likely in 1-2 days   Consultants:   General surgery  Procedures:   None  Antimicrobials:  Zosyn IV   Cefdinir  Flagyl   Subjective: Some nausea. No vomiting but dry heaves.  Objective: Vitals:   06/09/20 1444 06/09/20 1451 06/09/20 2115 06/10/20 0553  BP: (!) 89/58 104/72 (!) 112/45 (!) 98/46  Pulse: (!) 57 61 (!) 55 67  Resp: 19  16 17   Temp: 98.4 F (36.9 C)  97.8 F (36.6 C) 97.6 F (36.4 C)  TempSrc: Oral     SpO2: 98%  98% 96%  Weight:      Height:       No intake or output data  in the 24 hours ending 06/10/20 1423 Filed Weights   06/03/20 1315 06/04/20 0342  Weight: 90.3 kg 90.3 kg    Examination:  General exam: Appears calm and comfortable Respiratory system: Clear to auscultation. Respiratory effort normal. Cardiovascular system: S1 & S2 heard, RRR. No  murmurs, rubs, gallops or clicks. Gastrointestinal system: Abdomen is nondistended, soft and mildly tender in epigastrium. No organomegaly or masses felt. Normal bowel sounds heard. Central nervous system: Alert and oriented. No focal neurological deficits. Musculoskeletal: No calf tenderness Skin: No cyanosis. No rashes Psychiatry: Judgement and insight appear normal. Mood & affect appropriate.      Data Reviewed: I have personally reviewed following labs and imaging studies  CBC Lab Results  Component Value Date   WBC 7.8 06/10/2020   RBC 2.47 (L) 06/10/2020   HGB 8.4 (L) 06/10/2020   HCT 27.6 (L) 06/10/2020   MCV 111.7 (H) 06/10/2020   MCH 34.0 06/10/2020   PLT 208 06/10/2020   MCHC 30.4 06/10/2020   RDW 14.7 06/10/2020   LYMPHSABS 1.2 06/09/2020   MONOABS 0.7 06/09/2020   EOSABS 0.2 06/09/2020   BASOSABS 0.1 62/13/0865     Last metabolic panel Lab Results  Component Value Date   NA 143 06/10/2020   K 3.8 06/10/2020   CL 112 (H) 06/10/2020   CO2 23 06/10/2020   BUN 45 (H) 06/10/2020   CREATININE 1.45 (H) 06/10/2020   GLUCOSE 145 (H) 06/10/2020   GFRNONAA 39 (L) 06/10/2020   GFRAA 38 (L) 05/15/2020   CALCIUM 9.8 06/10/2020   PHOS 3.1 06/10/2020   PROT 4.9 (L) 06/10/2020   ALBUMIN 2.4 (L) 06/10/2020   BILITOT 0.5 06/10/2020   ALKPHOS 64 06/10/2020   AST 18 06/10/2020   ALT 17 06/10/2020   ANIONGAP 8 06/10/2020    CBG (last 3)  Recent Labs    06/09/20 2337 06/10/20 0754 06/10/20 1204  GLUCAP 141* 85 109*     GFR: Estimated Creatinine Clearance: 43.1 mL/min (A) (by C-G formula based on SCr of 1.45 mg/dL (H)).  Coagulation Profile: No results for input(s): INR, PROTIME in the last 168 hours.  Recent Results (from the past 240 hour(s))  Respiratory Panel by RT PCR (Flu A&B, Covid) - Nasopharyngeal Swab     Status: None   Collection Time: 06/04/20  5:06 AM   Specimen: Nasopharyngeal Swab  Result Value Ref Range Status   SARS Coronavirus 2 by RT PCR  NEGATIVE NEGATIVE Final    Comment: (NOTE) SARS-CoV-2 target nucleic acids are NOT DETECTED.  The SARS-CoV-2 RNA is generally detectable in upper respiratoy specimens during the acute phase of infection. The lowest concentration of SARS-CoV-2 viral copies this assay can detect is 131 copies/mL. A negative result does not preclude SARS-Cov-2 infection and should not be used as the sole basis for treatment or other patient management decisions. A negative result may occur with  improper specimen collection/handling, submission of specimen other than nasopharyngeal swab, presence of viral mutation(s) within the areas targeted by this assay, and inadequate number of viral copies (<131 copies/mL). A negative result must be combined with clinical observations, patient history, and epidemiological information. The expected result is Negative.  Fact Sheet for Patients:  PinkCheek.be  Fact Sheet for Healthcare Providers:  GravelBags.it  This test is no t yet approved or cleared by the [place] FDA and  has been authorized for detection and/or diagnosis of SARS-CoV-2 by FDA under an Emergency Use Authorization (EUA). This EUA will remain  in effect (  meaning this test can be used) for the duration of the COVID-19 declaration under Section 564(b)(1) of the Act, 21 U.S.C. section 360bbb-3(b)(1), unless the authorization is terminated or revoked sooner.     Influenza A by PCR NEGATIVE NEGATIVE Final   Influenza B by PCR NEGATIVE NEGATIVE Final    Comment: (NOTE) The Xpert Xpress SARS-CoV-2/FLU/RSV assay is intended as an aid in  the diagnosis of influenza from Nasopharyngeal swab specimens and  should not be used as a sole basis for treatment. Nasal washings and  aspirates are unacceptable for Xpert Xpress SARS-CoV-2/FLU/RSV  testing.  Fact Sheet for Patients: PinkCheek.be  Fact Sheet for  Healthcare Providers: GravelBags.it  This test is not yet approved or cleared by the [place] FDA and  has been authorized for detection and/or diagnosis of SARS-CoV-2 by  FDA under an Emergency Use Authorization (EUA). This EUA will remain  in effect (meaning this test can be used) for the duration of the  Covid-19 declaration under Section 564(b)(1) of the Act, 21  U.S.C. section 360bbb-3(b)(1), unless the authorization is  terminated or revoked. Performed at [facility], [place] [address] [place], [place] [address]   Urine culture     Status: Abnormal   Collection Time: 06/04/20  6:08 AM   Specimen: Urine, Random  Result Value Ref Range Status   Specimen Description URINE, RANDOM  Final   Special Requests   Final    NONE Performed at [facility], [place] [address], [place], [place] [address]    Culture >=100,000 COLONIES/mL ESCHERICHIA COLI (A)  Final   Report Status 06/05/2020 FINAL  Final   Organism ID, Bacteria ESCHERICHIA COLI (A)  Final      Susceptibility   Escherichia coli - MIC*    AMPICILLIN >=32 RESISTANT Resistant     CEFAZOLIN 16 SENSITIVE Sensitive     CEFTRIAXONE <=0.25 SENSITIVE Sensitive     CIPROFLOXACIN 1 SENSITIVE Sensitive     GENTAMICIN <=1 SENSITIVE Sensitive     IMIPENEM <=0.25 SENSITIVE Sensitive     NITROFURANTOIN <=16 SENSITIVE Sensitive     TRIMETH/SULFA <=20 SENSITIVE Sensitive     AMPICILLIN/SULBACTAM >=32 RESISTANT Resistant     PIP/TAZO <=4 SENSITIVE Sensitive     * >=100,000 COLONIES/mL ESCHERICHIA COLI  Blood culture (routine x 2)     Status: None   Collection Time: 06/04/20  8:23 AM   Specimen: BLOOD  Result Value Ref Range Status   Specimen Description BLOOD RIGHT ANTECUBITAL  Final   Special Requests   Final    BOTTLES DRAWN AEROBIC ONLY Blood Culture results may not be optimal due to an inadequate volume of blood received in culture bottles   Culture   Final    NO GROWTH 5 DAYS Performed  at [facility], [place] [address] [place], [place] [address]    Report Status 06/09/2020 FINAL  Final  Blood culture (routine x 2)     Status: None   Collection Time: 06/04/20  8:59 AM   Specimen: BLOOD LEFT HAND  Result Value Ref Range Status   Specimen Description BLOOD LEFT HAND  Final   Special Requests   Final    BOTTLES DRAWN AEROBIC ONLY Blood Culture results may not be optimal due to an inadequate volume of blood received in culture bottles   Culture   Final    NO GROWTH 5 DAYS Performed at [facility], [place] [address] [place], [place] [address]  Report Status 06/09/2020 FINAL  Final        Radiology Studies: No results found.      Scheduled Meds: . aspirin EC  81 mg Oral Daily  . cefdinir  300 mg Oral Q12H  . Chlorhexidine Gluconate Cloth  6 each Topical Daily  . donepezil  10 mg Oral Daily  . enoxaparin (LOVENOX) injection  40 mg Subcutaneous Q24H  . escitalopram  30 mg Oral Daily  . insulin aspart  0-9 Units Subcutaneous 4 times per day  . lamoTRIgine  150 mg Oral QHS  . lamoTRIgine  75 mg Oral Daily  . metroNIDAZOLE  500 mg Oral Q8H  . nadolol  10 mg Oral Daily  . nystatin   Topical TID  . predniSONE  10 mg Oral Q breakfast  . rosuvastatin  5 mg Oral Daily  . senna-docusate  1 tablet Oral BID  . sodium chloride flush  10-40 mL Intracatheter Q12H  . traZODone  200 mg Oral QHS   Continuous Infusions: . TPN CYCLIC-ADULT (ION)       LOS: 6 days     Cordelia Poche, MD [facility] 06/10/2020, 2:23 PM  If 7PM-7AM, please contact night-coverage [email or web address]

## 2020-06-10 NOTE — Progress Notes (Addendum)
Patient has home CPAP at bedside. Patient is able to place her self on/off as needed. Distilled water added to chamber for humidity per patient request.  Aware to call for assistance if needed.

## 2020-06-10 NOTE — Care Management Important Message (Signed)
Important Message  Patient Details  Name: Valerie [name] MRN: [id number] Date of Birth: 12/13/1960   Medicare Important Message Given:        [name] 06/10/2020, 2:31 PM

## 2020-06-10 NOTE — TOC Progression Note (Signed)
Transition of Care [facility]) - Progression Note    Patient Details  Name: Valerie [name] MRN: [id number] Date of Birth: August 04, 1961  [facility] CM/SW [place], [place] Phone Number: 06/10/2020, 12:02 PM  Clinical Narrative:     CSW called Noreene Filbert, and [facility] regarding TPN.   [facility] extended bed offer under condition that TPN would be discontinued. They are unable to take pt's on TPN .  Abigail Butts at [employer] is out this week and she will review pt with leadership.   [facility] explained they would review pt with leadership.        Expected Discharge Plan and Services                                                 Social Determinants of Health (SDOH) Interventions    Readmission Risk Interventions No flowsheet data found.

## 2020-06-10 NOTE — Progress Notes (Signed)
Physical Therapy Treatment Patient Details Name: Valerie [name] MRN: [id number] DOB: [birth year] Today's Date: 06/10/2020    History of Present Illness Pt is a 59 y/o female admitted secondary to progressive weakness and frequent falls. 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 CT abdomen/pelvis revealed left colonic diverticulosis with pericolonic edema/inflammation concerning for diverticulitis.  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 PMH includes bipolar disorder, CAD, DM, CKD, and psoriatic arthritis.     PT Comments    Pt was in pain on R knee with lateral instability, relieved with friction massage to the joint.  Her plan is to progress from  Requiring help to being more independent on RW for home.  Pt is in pain but was relieved partially by friction on R knee during session.  Will expect a rehab placement to be the best option now due to her unsafe walking on RW, painful and limited R knee stability and proprioceptive deficits, as well as limited help at home to manage her restrictions.  Follow acutely for these needs.  Follow Up Recommendations  SNF     Equipment Recommendations  None recommended by PT    Recommendations for Other Services       Precautions / Restrictions Precautions Precautions: Fall Precaution Comments: mult falls with R knee instabiltiy Restrictions Weight Bearing Restrictions: No    Mobility  Bed Mobility Overal bed mobility: Modified Independent                Transfers Overall transfer level: Needs assistance Equipment used: Rolling walker (2 wheeled);1 person hand held assist Transfers: Sit to/from Stand Sit to Stand: Min guard         General transfer comment: Cues for hand placement  Ambulation/Gait Ambulation/Gait assistance: Min guard;Min assist Gait Distance (Feet): 100 Feet Assistive device: Rolling walker (2 wheeled) Gait Pattern/deviations: Step-through  pattern;Decreased stride length;Decreased weight shift to right Gait velocity: decreased Gait velocity interpretation: <1.31 ft/sec, indicative of household ambulator General Gait Details: lateral instability and shift to LLE during gait   Stairs             Wheelchair Mobility    Modified Rankin (Stroke Patients Only)       Balance Overall balance assessment: Needs assistance Sitting-balance support: Feet unsupported;No upper extremity supported Sitting balance-Leahy Scale: Good       Standing balance-Leahy Scale: Poor                              Cognition Arousal/Alertness: Awake/alert Behavior During Therapy: WFL for tasks assessed/performed Overall Cognitive Status: Impaired/Different from baseline Area of Impairment: Problem solving;Safety/judgement;Following commands                     Memory: Decreased short-term memory Following Commands: Follows one step commands inconsistently Safety/Judgement: Decreased awareness of safety   Problem Solving: Requires verbal cues General Comments: sister is voicing details for pt      Exercises      General Comments General comments (skin integrity, edema, etc.): pt is able to walk but unsafe, used close following of chair to avoid her losign balance and sat her when R knee increasingly became painful      Pertinent Vitals/Pain Pain Assessment: Faces Faces Pain Scale: Hurts even more Pain Location: R knee with gait Pain Descriptors / Indicators: Grimacing;Guarding Pain Intervention(s): Monitored during session;Repositioned;Other (comment) (friction to R lateral collateral ligament)    Home Living  Prior Function            PT Goals (current goals can now be found in the care plan section) Acute Rehab PT Goals Patient Stated Goal: to get stronger Progress towards PT goals: Progressing toward goals    Frequency    Min 3X/week      PT Plan Current  plan remains appropriate    Co-evaluation              AM-PAC PT "6 Clicks" Mobility   Outcome Measure  Help needed turning from your back to your side while in a flat bed without using bedrails?: None Help needed moving from lying on your back to sitting on the side of a flat bed without using bedrails?: None Help needed moving to and from a bed to a chair (including a wheelchair)?: A Little Help needed standing up from a chair using your arms (e.g., wheelchair or bedside chair)?: A Little Help needed to walk in hospital room?: A Little Help needed climbing 3-5 steps with a railing? : A Lot 6 Click Score: 19    End of Session Equipment Utilized During Treatment: Gait belt Activity Tolerance: Patient tolerated treatment well Patient left: in chair;with call bell/phone within reach;with family/visitor present Nurse Communication: Mobility status PT Visit Diagnosis: Muscle weakness (generalized) (M62.81);History of falling (Z91.81);Unsteadiness on feet (R26.81)     Time: 0881-1031 PT Time Calculation (min) (ACUTE ONLY): 40 min  Charges:  $Gait Training: 8-22 mins $Therapeutic Exercise: 8-22 mins $Therapeutic Activity: 8-22 mins               Ramond Dial 06/10/2020, 8:31 PM  Mee Hives, PT MS Acute Rehab Dept. Number: [place]

## 2020-06-10 NOTE — Final Consult Note (Signed)
Consultant Final Sign-Off Note    Assessment/Final recommendations  Valerie [name] is a 59 y.o. female followed by me for diverticulitis and colovesical fistula. Abdominal pain, constipation, nausea, and vomiting are all improving on antibiotics. Continue PO abx for total of 14 days per ID, with plans for outpatient abx PRN for recurrent urinary symptoms related to CV fistula. Patient remains non-toxic with no acute surgical needs. On TPN for supplemental nutrition given moderate malnutrition and poor PO intake. We will sign off but will remain available as needed. Follow up as below to discuss elective, laparoscopic repair of CV fistula.   Wound care (if applicable): N/A   Diet at discharge: low residue diet/low fiber for at least 2 weeks   Activity at discharge: per primary team   Follow-up appointment:  06/17/2020 Dr. Michael Boston    Pending results:  Unresulted Labs (From admission, onward)          Start     Ordered   06/12/20 0500  Creatinine, serum  (enoxaparin (LOVENOX)    CrCl >/= 30 ml/min)  Weekly,   R     Comments: while on enoxaparin therapy   Question:  Specimen collection method  Answer:  IV Team=IV Team collect   06/05/20 2001   06/10/20 0500  CBC  (TPN Lab Panel)  Every Monday (0500),   R      06/04/20 1037   06/10/20 0500  Triglycerides  (TPN Lab Panel)  Every Monday (0500),   R      06/04/20 1037   06/10/20 0500  Prealbumin  (TPN Lab Panel)  Every Monday (0500),   R      06/04/20 1037   06/06/20 0500  Comprehensive metabolic panel  (TPN Lab Panel)  Every Mon,Thu (0500),   R      06/04/20 1037   06/06/20 0500  Magnesium  (TPN Lab Panel)  Every Mon,Thu (0500),   R      06/04/20 1037   06/06/20 0500  Phosphorus  (TPN Lab Panel)  Every Mon,Thu (0500),   R      06/04/20 1037           Medication recommendations:   Other recommendations:    Thank you for allowing [place] to participate in the care of your patient!  Please consult [place] again if you have further  needs for your patient.  Jill Alexanders 06/10/2020 8:37 AM    Subjective   Reports some nausea and an episode of dry heaves overnight, now resolved. +flatus. Last BM day before yesterday described as normal and non-bloody. Pneumaturia is improves compared to last week.   Objective  Vital signs in last 24 hours: Temp:  [97.6 F (36.4 C)-98.4 F (36.9 C)] 97.6 F (36.4 C) (10/18 0553) Pulse Rate:  [55-67] 67 (10/18 0553) Resp:  [16-19] 17 (10/18 0553) BP: (89-112)/(45-72) 98/46 (10/18 0553) SpO2:  [96 %-98 %] 96 % (10/18 0553)  General: alert, non-toxic, no acute distress Pulm; normal effort on room air CV: RRR Abd: soft, obese, mild epigastric tenderness to deep palpation, no lower abdominal tenderness, no peritonitis.   Pertinent labs and Studies: Recent Labs    06/08/20 0400 06/09/20 0414 06/10/20 0418  WBC 7.1 7.3 7.8  HGB 8.6* 8.1* 8.4*  HCT 27.9* 26.9* 27.6*   BMET Recent Labs    06/09/20 0414 06/10/20 0418  NA 141 143  K 3.9 3.8  CL 111 112*  CO2 26 23  GLUCOSE 148* 145*  BUN 41* 45*  CREATININE 1.36* 1.45*  CALCIUM 10.1 9.8   No results for input(s): LABURIN in the last 72 hours. Results for orders placed or performed during the hospital encounter of 06/03/20  Respiratory Panel by RT PCR (Flu A&B, Covid) - Nasopharyngeal Swab     Status: None   Collection Time: 06/04/20  5:06 AM   Specimen: Nasopharyngeal Swab  Result Value Ref Range Status   SARS Coronavirus 2 by RT PCR NEGATIVE NEGATIVE Final    Comment: (NOTE) SARS-CoV-2 target nucleic acids are NOT DETECTED.  The SARS-CoV-2 RNA is generally detectable in upper respiratoy specimens during the acute phase of infection. The lowest concentration of SARS-CoV-2 viral copies this assay can detect is 131 copies/mL. A negative result does not preclude SARS-Cov-2 infection and should not be used as the sole basis for treatment or other patient management decisions. A negative result may occur  with  improper specimen collection/handling, submission of specimen other than nasopharyngeal swab, presence of viral mutation(s) within the areas targeted by this assay, and inadequate number of viral copies (<131 copies/mL). A negative result must be combined with clinical observations, patient history, and epidemiological information. The expected result is Negative.  Fact Sheet for Patients:  PinkCheek.be  Fact Sheet for Healthcare Providers:  GravelBags.it  This test is no t yet approved or cleared by the [place] FDA and  has been authorized for detection and/or diagnosis of SARS-CoV-2 by FDA under an Emergency Use Authorization (EUA). This EUA will remain  in effect (meaning this test can be used) for the duration of the COVID-19 declaration under Section 564(b)(1) of the Act, 21 U.S.C. section 360bbb-3(b)(1), unless the authorization is terminated or revoked sooner.     Influenza A by PCR NEGATIVE NEGATIVE Final   Influenza B by PCR NEGATIVE NEGATIVE Final    Comment: (NOTE) The Xpert Xpress SARS-CoV-2/FLU/RSV assay is intended as an aid in  the diagnosis of influenza from Nasopharyngeal swab specimens and  should not be used as a sole basis for treatment. Nasal washings and  aspirates are unacceptable for Xpert Xpress SARS-CoV-2/FLU/RSV  testing.  Fact Sheet for Patients: PinkCheek.be  Fact Sheet for Healthcare Providers: GravelBags.it  This test is not yet approved or cleared by the [place] FDA and  has been authorized for detection and/or diagnosis of SARS-CoV-2 by  FDA under an Emergency Use Authorization (EUA). This EUA will remain  in effect (meaning this test can be used) for the duration of the  Covid-19 declaration under Section 564(b)(1) of the Act, 21  U.S.C. section 360bbb-3(b)(1), unless the authorization is  terminated or  revoked. Performed at [facility], [place] [address] [place], [place] [address]   Urine culture     Status: Abnormal   Collection Time: 06/04/20  6:08 AM   Specimen: Urine, Random  Result Value Ref Range Status   Specimen Description URINE, RANDOM  Final   Special Requests   Final    NONE Performed at [facility], [place] [address] [place], [place] [address]    Culture >=100,000 COLONIES/mL ESCHERICHIA COLI (A)  Final   Report Status 06/05/2020 FINAL  Final   Organism ID, Bacteria ESCHERICHIA COLI (A)  Final      Susceptibility   Escherichia coli - MIC*    AMPICILLIN >=32 RESISTANT Resistant     CEFAZOLIN 16 SENSITIVE Sensitive     CEFTRIAXONE <=0.25 SENSITIVE Sensitive     CIPROFLOXACIN 1 SENSITIVE Sensitive     GENTAMICIN <=1 SENSITIVE  Sensitive     IMIPENEM <=0.25 SENSITIVE Sensitive     NITROFURANTOIN <=16 SENSITIVE Sensitive     TRIMETH/SULFA <=20 SENSITIVE Sensitive     AMPICILLIN/SULBACTAM >=32 RESISTANT Resistant     PIP/TAZO <=4 SENSITIVE Sensitive     * >=100,000 COLONIES/mL ESCHERICHIA COLI  Blood culture (routine x 2)     Status: None   Collection Time: 06/04/20  8:23 AM   Specimen: BLOOD  Result Value Ref Range Status   Specimen Description BLOOD RIGHT ANTECUBITAL  Final   Special Requests   Final    BOTTLES DRAWN AEROBIC ONLY Blood Culture results may not be optimal due to an inadequate volume of blood received in culture bottles   Culture   Final    NO GROWTH 5 DAYS Performed at [facility], [place] [address], [place], [place] [address]    Report Status 06/09/2020 FINAL  Final  Blood culture (routine x 2)     Status: None   Collection Time: 06/04/20  8:59 AM   Specimen: BLOOD LEFT HAND  Result Value Ref Range Status   Specimen Description BLOOD LEFT HAND  Final   Special Requests   Final    BOTTLES DRAWN AEROBIC ONLY Blood Culture results may not be optimal due to an inadequate volume of blood received in culture bottles   Culture   Final     NO GROWTH 5 DAYS Performed at [facility], [place] [address] [place], [place] [address]    Report Status 06/09/2020 FINAL  Final    Imaging: No results found.

## 2020-06-10 NOTE — Progress Notes (Addendum)
PHARMACY - TOTAL PARENTERAL NUTRITION CONSULT NOTE   Indication: Colovesicular Fistula  Patient Measurements: Height: 5\' 2"  (157.5 cm) Weight: 90.3 kg (199 lb) IBW/kg (Calculated) : 50.1 TPN AdjBW (KG): 60.1 Body mass index is 36.4 kg/m. Usual Weight: 104kg  Assessment:  59 yo W recently admitted 05/14/20 for fistula now readmitted with worsening weakness, falls, lethargy, nausea, confusion and poor PO intake. PICC was placed outpatient 10/7 for TPN with first TPN to start 10/12. Plan for colectomy in 4-6 weeks after weaning off immunosuppresants. Poor PO intake since ~05/08/20 with documented weight loss of ~14% over 3-6 months.  Glucose / Insulin: CBGs controlled, utilized 4 units SSI in last 24hrs. On chronic prednisone 10mg . A1C 4.9 - no meds PTA.  Electrolytes: all WNL except corrected Ca high ~10.9 (improved), Cl 112 Renal: AKI improving - Scr 1.45 stable, BUN up to 45 LFTs / TGs: LFTs / Tbili / TG WNL Prealbumin / albumin: Prealbumin up 26.8; albumin up 2.4 Intake / Output; MIVF: UOP inaccurate, LBM 10/16 GI Imaging:  9/21 CT Abd: Large stool burden, possible colitis/diverticulitis, highly suspicious colovesicular fistula  10/12 CT Abd: L colonic diverticulitis, likely colovesical fistula and paracolonic abscess Surgeries / Procedures:  9/24 Flex sig: sigmoid stricture and diverticulosis   Central access: 10/7 PICC  TPN start date: 10/12   Nutritional Goals (per RD recommendation 10/13) kCal: 1800-2000, Protein: 110-125g, Fluid: > 1.8 L/d Goal TPN rate is 80 mL/hr (provides 115g of protein and 1970 kcals per day)   Current Nutrition:  TPN, regular diet- eating 75% x1 meal per documentation  Plan:  Continue 03-KJ cyclic TPN at 1791 with CBGs controlled <200. Cycle 1924 ml over 12hrs: 87 ml/hr x 1 hr; then 175 ml/hr x 10 hrs; then 87 ml/hr x 1 hr Will provide 115g AA, 269g Dextrose, 60g Lipid, and 1974 kCal; meeting 100% of patient protein and kCal needs Electrolytes in  TPN: Continue 75 mEq/L of Na, 28 mEq/L of K; 0 mEq/L of Ca, 29mEq/L of Mg, 17 mmol/L of Phos; Cl:Ac ratio 1:1 Watch high Ca (improved today) Add standard MVI and trace elements to TPN Continue custom cyclic 4x daily Sensitive SSI and adjust as needed Monitor TPN labs, PO intake and ability to wean TPN Surgery planning to continue TPN at SNF when bed available   Thank you for involving pharmacy in this patient's care.  Renold Genta, PharmD, BCPS Clinical Pharmacist Clinical phone for 06/10/2020 until 3p is [phone] 06/10/2020 7:37 AM  **Pharmacist phone directory can be found on [email or web address] listed under [place]**

## 2020-06-11 DIAGNOSIS — N390 Urinary tract infection, site not specified: Secondary | ICD-10-CM | POA: Diagnosis not present

## 2020-06-11 LAB — GLUCOSE, CAPILLARY
Glucose-Capillary: 123 mg/dL — ABNORMAL HIGH (ref 70–99)
Glucose-Capillary: 82 mg/dL (ref 70–99)
Glucose-Capillary: 120 mg/dL — ABNORMAL HIGH (ref 70–99)
Glucose-Capillary: 130 mg/dL — ABNORMAL HIGH (ref 70–99)
Glucose-Capillary: 179 mg/dL — ABNORMAL HIGH (ref 70–99)

## 2020-06-11 MED ORDER — TRAVASOL 10 % IV SOLN
INTRAVENOUS | Status: AC
  Filled 2020-06-11: qty 1154.4

## 2020-06-11 NOTE — Progress Notes (Signed)
Nutrition Follow-up  RD working remotely.  DOCUMENTATION CODES:   Non-severe (moderate) malnutrition in context of chronic illness, Obesity unspecified  INTERVENTION:   -Continue with soft diet -TPN management per pharmacy  NUTRITION DIAGNOSIS:   Moderate Malnutrition related to chronic illness (colovesical fistula) as evidenced by mild muscle depletion, energy intake < 75% for > or equal to 1 month, percent weight loss.  Ongoing  GOAL:   Patient will meet greater than or equal to 90% of their needs  Met with TPN  MONITOR:   PO intake, Labs, Weight trends, Skin, I & O's  REASON FOR ASSESSMENT:   Consult Assessment of nutrition requirement/status, New TPN/TNA  ASSESSMENT:   Valerie [name] is a 59 yo CF with PMH DMII, CKDIII, peripheral neuropathy, Psoriatic arthritis (chronic prednisone use), OSA, CAD and now recent diagnosis of colovesical fistula who was recently hospitalized 9/21 - 9/26 for abdominal pain and colitis related to underlying fistula. She is presenting today with worsening weakness, frequent falling, lethargy, worsening nausea, intermittent confusion, and has significantly lost her appetite and is not eating much per her sister who is bedside in the ER.  10/7- PICC placed 10/12- TPN initiated 10/15- transition to cyclic TPN   Reviewed I/O's: +767 ml x 24 hours and +3.6 L since admission  Attempted to speak with pt via call to hospital room phone, however, unable to reach.   Pt consuming soft diet; noted meal completion 75-100%, however, meals tend to be small (pancakes or scrambled eggs or sandwich or mac and cheese).   Pt remains on TPN for nutritional support- (59-XH cyclic TPN- cycle 7414 ml over 12hrs: 87 ml/hr x 1 hr; then 175 ml/hr x 10 hrs; then 87 ml/hr x 1 hr). Regimen providing 1974 kcals and 115 grams protein, meeting 100% of estimated kcal and protein needs.  Per general surgery notes, plan for robotic colectomy once pt able to improve at SNF/  therapies.   Per TOC notes, plan for SNF placement, however, TPN is a barrier to placement.   Labs reviewed: CBGS: 82-158 (inpatient orders for glycemic control are 0-9 units insulin aspart 4 times daily).   Diet Order:   Diet Order            DIET SOFT Room service appropriate? Yes; Fluid consistency: Thin  Diet effective now                 EDUCATION NEEDS:   Education needs have been addressed  Skin:  Skin Assessment: Reviewed RN Assessment  Last BM:  06/10/20  Height:   Ht Readings from Last 1 Encounters:  06/04/20 _0  (1.575 m)    Weight:   Wt Readings from Last 1 Encounters:  06/04/20 90.3 kg    Ideal Body Weight:  50 kg  BMI:  Body mass index is 36.4 kg/m.  Estimated Nutritional Needs:   Kcal:  1800-2000  Protein:  110-125 grams  Fluid:  > 1.8 L    Loistine Chance, RD, LDN, [place] Registered Dietitian II Certified Diabetes Care and Education Specialist Please refer to [facility] for RD and/or RD on-call/weekend/after hours pager

## 2020-06-11 NOTE — Progress Notes (Signed)
PHARMACY - TOTAL PARENTERAL NUTRITION CONSULT NOTE   Indication: Colovesicular Fistula  Patient Measurements: Height: 5\' 2"  (157.5 cm) Weight: 90.3 kg (199 lb) IBW/kg (Calculated) : 50.1 TPN AdjBW (KG): 60.1 Body mass index is 36.4 kg/m. Usual Weight: 104kg  Assessment:  59 yo W recently admitted 05/14/20 for fistula now readmitted with worsening weakness, falls, lethargy, nausea, confusion and poor PO intake. PICC was placed outpatient 10/7 for TPN with first TPN to start 10/12. Plan for colectomy in 4-6 weeks after weaning off immunosuppresants. Poor PO intake since ~05/08/20 with documented weight loss of ~14% over 3-6 months.  Glucose / Insulin: CBGs 123-201 while on TPN, utilized 6 units SSI in last 24hrs. On chronic prednisone 10mg . A1C 4.9 - no meds PTA.  Electrolytes: all WNL except corrected Ca high ~10.9 (improved), Cl 112 Renal: AKI improving - Scr 1.45 stable, BUN up to 45 LFTs / TGs: LFTs / Tbili / TG WNL Prealbumin / albumin: Prealbumin up 26.8; albumin up 2.4 Intake / Output; MIVF: UOP inaccurate, LBM 10/18 GI Imaging:  9/21 CT Abd: Large stool burden, possible colitis/diverticulitis, highly suspicious colovesicular fistula  10/12 CT Abd: L colonic diverticulitis, likely colovesical fistula and paracolonic abscess Surgeries / Procedures:  9/24 Flex sig: sigmoid stricture and diverticulosis   Central access: 10/7 PICC  TPN start date: 10/12   Nutritional Goals (per RD recommendation 10/13) kCal: 1800-2000, Protein: 110-125g, Fluid: > 1.8 L/d Goal TPN rate is 80 mL/hr (provides 115g of protein and 1970 kcals per day)   Current Nutrition:  TPN, regular diet - % meal eaten not documented 10/18  Plan:  Continue 39-QZ cyclic TPN at 0092 with CBGs controlled <200. Cycle 1924 ml over 12hrs: 87 ml/hr x 1 hr; then 175 ml/hr x 10 hrs; then 87 ml/hr x 1 hr Will provide 115g AA, 269g Dextrose, 60g Lipid, and 1974 kCal; meeting 100% of patient protein and kCal  needs Electrolytes in TPN: Continue 75 mEq/L of Na, 28 mEq/L of K; 0 mEq/L of Ca, 49mEq/L of Mg, 17 mmol/L of Phos; Cl:Ac ratio 1:1 Watch high Ca (improved today) Add standard MVI and trace elements to TPN Continue custom cyclic 4x daily Sensitive SSI and adjust as needed Monitor TPN labs, PO intake and ability to wean TPN Surgery planning to continue TPN at SNF when bed available   Thank you for involving pharmacy in this patient's care.  Renold Genta, PharmD, BCPS Clinical Pharmacist Clinical phone for 06/11/2020 until 3p is [phone] 06/11/2020 7:31 AM  **Pharmacist phone directory can be found on [email or web address] listed under [place]**

## 2020-06-11 NOTE — Progress Notes (Signed)
PROGRESS NOTE    Valerie [name]  QAS:[id number] DOB: 1961-08-19 DOA: 06/03/2020 PCP: Otelia Santee I, MD   Brief Narrative: Valerie [name] is a 59 y.o. female with a history of diabetes mellitus, type II, CKD stage IIIb, peripheral neuropathy, psoriatic arthritis, obstructive sleep apnea, CAD, colovesical fistula.  Patient presented secondary to worsening weakness with concern for urinary tract infection.  She was started on empiric antibiotics with cultures obtained.   Assessment & Plan:   Active Problems:   Anxiety   Psoriatic arthritis (HCC)   OSA (obstructive sleep apnea)   Bipolar disorder (HCC)   DM type 2 (diabetes mellitus, type 2) (HCC)   Colovesical fistula   Weakness   CAD (coronary artery disease)   Macrocytic anemia   Malnutrition of moderate degree   Urinary tract infection Patient empirically started on Zosyn IV for treatment.  Urine culture significant for E. Coli with sensitivities available.  In setting of colovesical fistula. Discussed with ID and recommending 7 day total treatment course for UTI with refill available for recurrent UTI symptoms. -Cefdinir 300 mg twice daily (treating for 10-14 days as below)  Diverticulitis -Cefdinir as above in addition to Flagyl to treat for 10-14 days total  Weakness Concern on admission was related to infection in setting of colovesical history of versus glucocorticosteroid withdrawal. Physical therapy recommending SNF.  Colovesical fistula Patient followed by general surgery as an outpatient with plan for laparoscopic management. -General surgery recommendations: Following this admission. Plan for outpatient management  Psoriatic arthritis Patient has been managed with methotrexate, prednisone, Plaquenil, Enbrel which were held on previous discharge.  Patient's prednisone was decreased to 5 mg daily at discharge as well.  Prednisone was continued at 10 mg dose this admission.  Macrocytic anemia Trended down slowly.   Folate and vitamin B12 levels ordered on admission and are normal. CBC stable. Hemoglobin on admission of 11 with baseline of around 13. Trended down to 8.4. Stable. -FOBT  Coronary artery disease Patient with history of NSTEMI.  Patient follows with cardiology as outpatient. -Continue aspirin, nadolol, Crestor  Memory impairment Patient is aware of her memory deficits.  Unsure if this is related to dementia versus other cognitive impairment.  Patient is currently on donepezil as an outpatient -Continue donepezil  Diabetes mellitus, type II Patient is not on medication therapy as an outpatient.  Complicated by prednisone use.  Last hemoglobin A1c of 4.9% obtained this admission.  Patient is currently now on TPN. -Continue sliding scale insulin  CKD stage IIIb Baseline creatinine of 1.6-1.8.  Patient currently at baseline.  Moderate malnutrition Dietitian consulted.  Patient started on TPN per outpatient plan by PCP.  Patient presented with PICC line in place. -Continue TPN   DVT prophylaxis: Lovenox Code Status:   Code Status: Full Code Family Communication: None at bedside Disposition Plan: Discharge to SNF. SNF now saying they cannot take the patient. I have called to speak with administration at the SNF and I am still awaiting a call back. If patient cannot discharge to SNF, she will discharge home with home health; will need orders earlier in the day for TPN delivery.   Consultants:   General surgery  Procedures:   None  Antimicrobials:  Zosyn IV   Cefdinir  Flagyl   Subjective: No reports overnight.  Objective: Vitals:   06/10/20 1959 06/11/20 0426 06/11/20 0622 06/11/20 1511  BP: (!) 102/54 104/60 116/71 107/73  Pulse: (!) 52 (!) 54 (!) 105 (!) 55  Resp: 16  17  Temp: 98.3 F (36.8 C) 99 F (37.2 C)  98.8 F (37.1 C)  TempSrc:  Oral  Oral  SpO2: 97% 97% (!) 83% 100%  Weight:      Height:        Intake/Output Summary (Last 24 hours) at  06/11/2020 1609 Last data filed at 06/11/2020 9798 Gross per 24 hour  Intake 769.63 ml  Output --  Net 769.63 ml   Filed Weights   06/03/20 1315 06/04/20 0342  Weight: 90.3 kg 90.3 kg    Examination:  General: Well appearing, no distress    Data Reviewed: I have personally reviewed following labs and imaging studies  CBC Lab Results  Component Value Date   WBC 7.8 06/10/2020   RBC 2.47 (L) 06/10/2020   HGB 8.4 (L) 06/10/2020   HCT 27.6 (L) 06/10/2020   MCV 111.7 (H) 06/10/2020   MCH 34.0 06/10/2020   PLT 208 06/10/2020   MCHC 30.4 06/10/2020   RDW 14.7 06/10/2020   LYMPHSABS 1.2 06/09/2020   MONOABS 0.7 06/09/2020   EOSABS 0.2 06/09/2020   BASOSABS 0.1 92/06/9416     Last metabolic panel Lab Results  Component Value Date   NA 143 06/10/2020   K 3.8 06/10/2020   CL 112 (H) 06/10/2020   CO2 23 06/10/2020   BUN 45 (H) 06/10/2020   CREATININE 1.45 (H) 06/10/2020   GLUCOSE 145 (H) 06/10/2020   GFRNONAA 39 (L) 06/10/2020   GFRAA 38 (L) 05/15/2020   CALCIUM 9.8 06/10/2020   PHOS 3.1 06/10/2020   PROT 4.9 (L) 06/10/2020   ALBUMIN 2.4 (L) 06/10/2020   BILITOT 0.5 06/10/2020   ALKPHOS 64 06/10/2020   AST 18 06/10/2020   ALT 17 06/10/2020   ANIONGAP 8 06/10/2020    CBG (last 3)  Recent Labs    06/11/20 0618 06/11/20 0821 06/11/20 1219  GLUCAP 123* 82 120*     GFR: Estimated Creatinine Clearance: 43.1 mL/min (A) (by C-G formula based on SCr of 1.45 mg/dL (H)).  Coagulation Profile: No results for input(s): INR, PROTIME in the last 168 hours.  Recent Results (from the past 240 hour(s))  Respiratory Panel by RT PCR (Flu A&B, Covid) - Nasopharyngeal Swab     Status: None   Collection Time: 06/04/20  5:06 AM   Specimen: Nasopharyngeal Swab  Result Value Ref Range Status   SARS Coronavirus 2 by RT PCR NEGATIVE NEGATIVE Final    Comment: (NOTE) SARS-CoV-2 target nucleic acids are NOT DETECTED.  The SARS-CoV-2 RNA is generally detectable in upper  respiratoy specimens during the acute phase of infection. The lowest concentration of SARS-CoV-2 viral copies this assay can detect is 131 copies/mL. A negative result does not preclude SARS-Cov-2 infection and should not be used as the sole basis for treatment or other patient management decisions. A negative result may occur with  improper specimen collection/handling, submission of specimen other than nasopharyngeal swab, presence of viral mutation(s) within the areas targeted by this assay, and inadequate number of viral copies (<131 copies/mL). A negative result must be combined with clinical observations, patient history, and epidemiological information. The expected result is Negative.  Fact Sheet for Patients:  PinkCheek.be  Fact Sheet for Healthcare Providers:  GravelBags.it  This test is no t yet approved or cleared by the [place] FDA and  has been authorized for detection and/or diagnosis of SARS-CoV-2 by FDA under an Emergency Use Authorization (EUA). This EUA will remain  in effect (meaning this test can be used) for  the duration of the COVID-19 declaration under Section 564(b)(1) of the Act, 21 U.S.C. section 360bbb-3(b)(1), unless the authorization is terminated or revoked sooner.     Influenza A by PCR NEGATIVE NEGATIVE Final   Influenza B by PCR NEGATIVE NEGATIVE Final    Comment: (NOTE) The Xpert Xpress SARS-CoV-2/FLU/RSV assay is intended as an aid in  the diagnosis of influenza from Nasopharyngeal swab specimens and  should not be used as a sole basis for treatment. Nasal washings and  aspirates are unacceptable for Xpert Xpress SARS-CoV-2/FLU/RSV  testing.  Fact Sheet for Patients: PinkCheek.be  Fact Sheet for Healthcare Providers: GravelBags.it  This test is not yet approved or cleared by the [place] FDA and  has been  authorized for detection and/or diagnosis of SARS-CoV-2 by  FDA under an Emergency Use Authorization (EUA). This EUA will remain  in effect (meaning this test can be used) for the duration of the  Covid-19 declaration under Section 564(b)(1) of the Act, 21  U.S.C. section 360bbb-3(b)(1), unless the authorization is  terminated or revoked. Performed at [facility], [place] [address], [place], [place] [address]   Urine culture     Status: Abnormal   Collection Time: 06/04/20  6:08 AM   Specimen: Urine, Random  Result Value Ref Range Status   Specimen Description URINE, RANDOM  Final   Special Requests   Final    NONE Performed at [facility], [place] [address], [place], [place] [address]    Culture >=100,000 COLONIES/mL ESCHERICHIA COLI (A)  Final   Report Status 06/05/2020 FINAL  Final   Organism ID, Bacteria ESCHERICHIA COLI (A)  Final      Susceptibility   Escherichia coli - MIC*    AMPICILLIN >=32 RESISTANT Resistant     CEFAZOLIN 16 SENSITIVE Sensitive     CEFTRIAXONE <=0.25 SENSITIVE Sensitive     CIPROFLOXACIN 1 SENSITIVE Sensitive     GENTAMICIN <=1 SENSITIVE Sensitive     IMIPENEM <=0.25 SENSITIVE Sensitive     NITROFURANTOIN <=16 SENSITIVE Sensitive     TRIMETH/SULFA <=20 SENSITIVE Sensitive     AMPICILLIN/SULBACTAM >=32 RESISTANT Resistant     PIP/TAZO <=4 SENSITIVE Sensitive     * >=100,000 COLONIES/mL ESCHERICHIA COLI  Blood culture (routine x 2)     Status: None   Collection Time: 06/04/20  8:23 AM   Specimen: BLOOD  Result Value Ref Range Status   Specimen Description BLOOD RIGHT ANTECUBITAL  Final   Special Requests   Final    BOTTLES DRAWN AEROBIC ONLY Blood Culture results may not be optimal due to an inadequate volume of blood received in culture bottles   Culture   Final    NO GROWTH 5 DAYS Performed at [facility], [place] [address], [place],  [address]    Report Status 06/09/2020 FINAL  Final  Blood culture (routine x 2)     Status:  None   Collection Time: 06/04/20  8:59 AM   Specimen: BLOOD LEFT HAND  Result Value Ref Range Status   Specimen Description BLOOD LEFT HAND  Final   Special Requests   Final    BOTTLES DRAWN AEROBIC ONLY Blood Culture results may not be optimal due to an inadequate volume of blood received in culture bottles   Culture   Final    NO GROWTH 5 DAYS Performed at [facility], [place] [address] [place],  [address]    Report Status 06/09/2020 FINAL  Final  Radiology Studies: No results found.      Scheduled Meds: . aspirin EC  81 mg Oral Daily  . cefdinir  300 mg Oral Q12H  . Chlorhexidine Gluconate Cloth  6 each Topical Daily  . donepezil  10 mg Oral Daily  . enoxaparin (LOVENOX) injection  40 mg Subcutaneous Q24H  . escitalopram  30 mg Oral Daily  . insulin aspart  0-9 Units Subcutaneous 4 times per day  . lamoTRIgine  150 mg Oral QHS  . lamoTRIgine  75 mg Oral Daily  . metroNIDAZOLE  500 mg Oral Q8H  . nadolol  10 mg Oral Daily  . nystatin   Topical TID  . predniSONE  10 mg Oral Q breakfast  . rosuvastatin  5 mg Oral Daily  . senna-docusate  1 tablet Oral BID  . sodium chloride flush  10-40 mL Intracatheter Q12H  . traZODone  200 mg Oral QHS   Continuous Infusions: . TPN CYCLIC-ADULT (ION)       LOS: 7 days     Cordelia Poche, MD [facility] 06/11/2020, 4:09 PM  If 7PM-7AM, please contact night-coverage [email or web address]

## 2020-06-11 NOTE — Care Management Important Message (Signed)
Important Message  Patient Details  Name: Valerie [name] MRN: [id number] Date of Birth: May 23, 1961   Medicare Important Message Given:  Yes     Naif Alabi 06/11/2020, 3:45 PM

## 2020-06-11 NOTE — Progress Notes (Signed)
Patient has home CPAP machine at bedside. Distilled water added to chamber. Patient able to place herself on/off as needed.

## 2020-06-12 DIAGNOSIS — E440 Moderate protein-calorie malnutrition: Secondary | ICD-10-CM | POA: Diagnosis not present

## 2020-06-12 DIAGNOSIS — N321 Vesicointestinal fistula: Secondary | ICD-10-CM | POA: Diagnosis not present

## 2020-06-12 LAB — GLUCOSE, CAPILLARY
Glucose-Capillary: 135 mg/dL — ABNORMAL HIGH (ref 70–99)
Glucose-Capillary: 141 mg/dL — ABNORMAL HIGH (ref 70–99)
Glucose-Capillary: 152 mg/dL — ABNORMAL HIGH (ref 70–99)
Glucose-Capillary: 155 mg/dL — ABNORMAL HIGH (ref 70–99)
Glucose-Capillary: 172 mg/dL — ABNORMAL HIGH (ref 70–99)
Glucose-Capillary: 182 mg/dL — ABNORMAL HIGH (ref 70–99)
Glucose-Capillary: 96 mg/dL (ref 70–99)

## 2020-06-12 MED ORDER — OMEPRAZOLE 20 MG PO CPDR: 20.0000 mg | DELAYED_RELEASE_CAPSULE | Freq: Two times a day (BID) | ORAL | 3 refills | Status: DC

## 2020-06-12 MED ORDER — CEFDINIR 300 MG PO CAPS: 300.0000 mg | ORAL_CAPSULE | Freq: Two times a day (BID) | ORAL | 0 refills | Status: AC

## 2020-06-12 MED ORDER — METRONIDAZOLE 500 MG PO TABS: 500.0000 mg | ORAL_TABLET | Freq: Three times a day (TID) | ORAL | 0 refills | Status: AC

## 2020-06-12 MED ORDER — TRAVASOL 10 % IV SOLN
INTRAVENOUS | Status: AC
  Filled 2020-06-12: qty 1154.4

## 2020-06-12 MED ORDER — ALPRAZOLAM 0.5 MG PO TABS
1.0000 mg | ORAL_TABLET | Freq: Three times a day (TID) | ORAL | Status: DC | PRN
  Administered 2020-06-12 (×2): 1 mg via ORAL
  Filled 2020-06-12 (×2): qty 2

## 2020-06-12 NOTE — Discharge Instructions (Signed)
Deconditioning °Deconditioning refers to the changes in the body that occur during a period of inactivity. The changes happen in the heart, lungs, and muscles. They make you feel tired and weak (fatigued) and decrease your ability to be active. The three stages of deconditioning include: °· Mild deconditioning. This is a change in your ability to do your usual exercise activities, such as running, biking, or swimming. °· Moderate deconditioning. This is a change in your ability to do normal everyday activities, such as walking, shopping for groceries, and doing chores. °· Severe deconditioning. In this stage, you may not be able to do minimal activity or usual self-care. °What are the causes? °Deconditioning can occur after only a few days of inactivity. The longer the period of inactivity, the more severe the deconditioning will be, and the longer it will take to return to your previous level of functioning. Deconditioning is often caused by inactivity due to: °· Illnesses, such as cancer, stroke, heart attack, fibromyalgia, and chronic fatigue syndrome. °· Injuries, especially back injuries, broken bones, and injuries to soft tissues, such as ligaments and tendons. °· A long stay in the hospital. °· Pregnancy, especially if long periods of bed rest are needed. °What increases the risk? °The following factors may make you more likely to develop this condition: °· Staying in the hospital or being on bed rest. °· Obesity. °· Poor nutrition. °· Being an older adult. °· Having an injury or illness that affects your movement and activity. °What are the signs or symptoms? °Symptoms of this condition include: °· Weakness and tiredness. °· Shortness of breath with minor physical effort (exertion). °· A heartbeat that is faster than normal. You may not notice this without taking your pulse. °· Pain or discomfort with activity. °· Decreased strength, endurance, and balance. °· Difficulty doing your usual forms of  exercise. °· Difficulty doing activities of daily living, such as grocery shopping or chores. You may also have problems walking around the house and doing basic self-care, such as getting to the bathroom, preparing meals, or doing laundry. °How is this diagnosed? °This condition is diagnosed based on your medical history and a physical exam. During the physical exam, your health care provider will check for signs of deconditioning, such as: °· Decreased size of muscles. °· Decreased strength. °· Trouble with balance. °· Shortness of breath or a heart rate that is faster than normal after minor exertion. °How is this treated? °Treatment for this condition involves an exercise program in which activity is increased slowly. Your health care provider will tell you which exercises are right for you. The exercise program will likely include: °· Aerobic exercise. This type of exercise helps improve the functioning of the heart, lungs, and muscles. °· Strength training. This type of exercise helps increase muscle size and strength. °Both of these types of exercise will improve your endurance. You may be referred to a physical therapist who can create a safe strengthening program for you to follow. °Follow these instructions at home: °Eating and drinking ° °· Eat a healthy, well-balanced diet. This includes: °? Proteins, such as lean meats and fish, to build muscles. °? Fresh fruits and vegetables. °? Carbohydrates, such as whole grains, to boost energy. °· Drink enough fluid to keep your urine pale yellow. °Activity ° °· Follow the exercise program that is recommended by your health care provider or physical therapist. °· Do not increase your exercise any faster than directed. °General instructions °· Take over-the-counter and prescription medicines only   as told by your health care provider. °· Do not use any products that contain nicotine or tobacco, such as cigarettes, e-cigarettes, and chewing tobacco. If you need help  quitting, ask your health care provider. °· Keep all follow-up visits as told by your health care provider. This is important. °Contact a health care provider if: °· You are not able to do the recommended exercise program. °· You are becoming more and more tired and weak. °· You become light-headed when rising to a sitting or standing position. °· Your level of endurance decreases after it has improved. °Get help right away if you: °· Have chest pain. °· Are very short of breath. °· Have any episodes of fainting. °Summary °· Deconditioning refers to the changes in the body that occur during a period of inactivity. °· Deconditioning happens in the heart, lungs, and muscles. The changes make you feel tired and weak and decrease your ability to be active. °· Treatment for deconditioning involves an exercise program in which activity is increased slowly. °This information is not intended to replace advice given to you by your health care provider. Make sure you discuss any questions you have with your health care provider. °Document Revised: 01/05/2019 Document Reviewed: 01/05/2019 °Elsevier Patient Education © 2020 [employer] ° °

## 2020-06-12 NOTE — Progress Notes (Signed)
Occupational Therapy Treatment Patient Details Name: Valerie [name] MRN: [id number] DOB: 1960/12/16 Today's Date: 06/12/2020    History of present illness Pt is a 59 y/o female admitted secondary to progressive weakness and frequent falls. 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 CT abdomen/pelvis revealed left colonic diverticulosis with pericolonic edema/inflammation concerning for diverticulitis.  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 PMH includes bipolar disorder, CAD, DM, CKD, and psoriatic arthritis.    OT comments  Pt making steady progress towards OT goals this session. Pt continues to present with decreased safety awareness, decreased activity tolerance and generalized weakness impacting pts ability to complete BADLs. Pt overall, requires s/u for UB ADLS, MIN guard for LB ADLs, and min guard for functional mobility with RW needing cues for RW mgmt and for safety awareness. Pt reports frequent falls at home. Extensive education provided on fall prevention strategies. Pt reports grab bars will be installed at home as pt reports frequent falls in the bathroom. Feel pt more appropriate for SNF placement although noted pt is getting HH. Will continue to follow acutely per [place].    Follow Up Recommendations  SNF    Equipment Recommendations  3 in 1 bedside commode    Recommendations for Other Services      Precautions / Restrictions Precautions Precautions: Fall Precaution Comments: mult falls with R knee instabiltiy Restrictions Weight Bearing Restrictions: No       Mobility Bed Mobility               General bed mobility comments: pt sitting EOB upon arrival, sister reports pts bed is in living room  Transfers Overall transfer level: Needs assistance Equipment used: Rolling walker (2 wheeled) Transfers: Sit to/from Stand Sit to Stand: Min guard         General transfer comment: close minguard and cues  for hand placement especially in bathroom    Balance Overall balance assessment: Needs assistance Sitting-balance support: Feet unsupported;No upper extremity supported Sitting balance-Leahy Scale: Good     Standing balance support: Single extremity supported;During functional activity Standing balance-Leahy Scale: Poor Standing balance comment: at least one UE supported during ADLs                           ADL either performed or assessed with clinical judgement   ADL Overall ADL's : Needs assistance/impaired     Grooming: Oral care;Supervision/safety;Standing Grooming Details (indicate cue type and reason): pt able to complete oral care while standing at sink with supervision for safety secondary to frequent falls         Upper Body Dressing : Set up;Sitting Upper Body Dressing Details (indicate cue type and reason): from EOB Lower Body Dressing: Supervision/safety;Set up;Min guard;Sit to/from stand Lower Body Dressing Details (indicate cue type and reason): min guard when pt sit<>stand from EOB and for safety when reaching out BOS with no AD Toilet Transfer: Min guard;RW;Ambulation;Grab bars Toilet Transfer Details (indicate cue type and reason): noted heavy use of grab bars; sister reports plan to install some at home Toileting- Clothing Manipulation and Hygiene: Supervision/safety;Sitting/lateral lean     Tub/Shower Transfer Details (indicate cue type and reason): pt reports not getting into shower at home as shower is on the second floor, bird baths at home Functional mobility during ADLs: Min guard;Rolling walker;Cueing for safety General ADL Comments: pt reports frequent falls at home, mostly all occurring in the bathroom. education provided on fall prevention strategies. pt plans  to have grab bars installed in bathroom     [place]: Awake/alert Behavior During Therapy: WFL for tasks  assessed/performed Overall Cognitive Status: Within Functional Limits for tasks assessed                                 General Comments: overall for simple mobility tasks, noted some decreased safety awareness        Exercises     Shoulder Instructions       General Comments pts sister present during session, feel pt more appropriate for SNF placement but noted denial d/t TPN    Pertinent Vitals/ Pain       Pain Assessment: No/denies pain  Home Living                                          Prior Functioning/Environment              Frequency  Min 2X/week        Progress Toward Goals  OT Goals(current goals can now be found in the care plan section)  Progress towards OT goals: Progressing toward goals  Acute Rehab OT Goals Patient Stated Goal: to get stronger OT Goal Formulation: With patient Time For Goal Achievement: 06/20/20 Potential to Achieve Goals: Good  Plan Discharge plan remains appropriate;Frequency remains appropriate    Co-evaluation                 AM-PAC OT "6 Clicks" Daily Activity     Outcome Measure   Help from another person eating meals?: None Help from another person taking care of personal grooming?: A Little Help from another person toileting, which includes using toliet, bedpan, or urinal?: A Little Help from another person bathing (including washing, rinsing, drying)?: A Lot Help from another person to put on and taking off regular upper body clothing?: A Little Help from another person to put on and taking off regular lower body clothing?: A Little 6 Click Score: 18    End of Session Equipment Utilized During Treatment: Rolling walker;Gait belt  OT Visit Diagnosis: Unsteadiness on feet (R26.81);Other abnormalities of gait and mobility (R26.89);Muscle weakness (generalized) (M62.81)   Activity Tolerance Patient tolerated treatment well   Patient Left in bed;with call bell/phone  within reach;with family/visitor present;Other (comment);with nursing/sitter in room (sitting EOB)   Nurse Communication Mobility status        Time: 2500-3704 OT Time Calculation (min): 18 min  Charges: OT General Charges $OT Visit: 1 Visit OT Treatments $Self Care/Home Management : 8-22 mins  Lanier Clam., COTA/L Acute Rehabilitation Services [phone]   Ihor Gully 06/12/2020, 12:45 PM

## 2020-06-12 NOTE — Discharge Summary (Signed)
Physician Discharge Summary  Valerie [name] NUU:[id number] DOB: 06/27/1961 DOA: 06/03/2020  PCP: Otelia Santee I, MD  Admit date: 06/03/2020 Discharge date: 06/12/2020  Admitted From: Home Disposition: Home with home health  Recommendations for Outpatient Follow-up:  1. Follow up with PCP in 1-2 weeks 2. Follow-up chemistry, CMP magnesium and phosphorus as per TPN protocol by home health nurse.  Home Health: PT/RN/home health aide Equipment/Devices: TPN  Discharge Condition: Fair CODE STATUS: Full code Diet recommendation: As tolerated and TPN.  Discharge summary: Valerie [name] is a 59 y.o. female with a history of diabetes mellitus type II, CKD stage IIIb, peripheral neuropathy, psoriatic arthritis, obstructive sleep apnea, CAD, colovesical fistula.  Patient presented secondary to worsening weakness with concern for urinary tract infection.  She was started on empiric antibiotics with cultures obtained.  Treated for following conditions.  #1. urinary tract infection, present on admission.  Empirically treated with Zosyn.  Urine culture with E. coli.  Patient has complicated UTI given colovesical fistula.  Will treat with 10 days of antibiotic therapy with cefdinir.  ID recommended that she can empirically use antibiotics next time she has any events.  # 2. Diverticulitis: Cefdinir and Flagyl for total 10 days.  Patient complained of persistent epigastric discomfort, will increase her omeprazole to twice daily.  # 3. Colovesical fistula: Followed by general surgery as outpatient.  They do have surgical management plan.  Patient with moderate protein calorie malnutrition, currently planned for TPN and PICC line in place.  She is going home with PICC line.  Oral diet as tolerated. TPN and electrolytes to be managed by pharmacy along with her primary care physician. She will be followed by her surgery.  #4. coronary artery disease: Currently stable.  On aspirin, nadolol and Crestor.  #5.  Stage IIIb chronic kidney disease: Creatinine at about 1.6-1.8 and stable.  # 6.  Psoriatic arthritis: Patient on chronic prednisone therapy.  She will continue this.  Physical deconditioning, patient was seen by PT OT recommended inpatient therapies, however her insurance company declined placement to skilled nursing facility.  She is going home with home health therapies and support at home.  Discharge home when home health arrangements can be made.   Discharge Diagnoses:  Active Problems:   Anxiety   Psoriatic arthritis (HCC)   OSA (obstructive sleep apnea)   Bipolar disorder (HCC)   DM type 2 (diabetes mellitus, type 2) (HCC)   Colovesical fistula   Weakness   CAD (coronary artery disease)   Macrocytic anemia   Malnutrition of moderate degree    Discharge Instructions  Discharge Instructions    Call MD for:  persistant nausea and vomiting   Complete by: As directed    Call MD for:  severe uncontrolled pain   Complete by: As directed    Diet - low sodium heart healthy   Complete by: As directed    Increase activity slowly   Complete by: As directed      Allergies as of 06/12/2020      Reactions   Carbamazepine Other (See Comments)   Parkinsons like symptoms tremors   Sertraline Hcl Other (See Comments)   Unknown reaction      Medication List    STOP taking these medications   clopidogrel 75 MG tablet Commonly known as: PLAVIX   levocetirizine 5 MG tablet Commonly known as: XYZAL     TAKE these medications   ALPRAZolam 1 MG tablet Commonly known as: XANAX Take 1 mg by mouth 3 (three)  times daily as needed for anxiety (depression.).   aspirin EC 81 MG tablet Take 1 tablet (81 mg total) by mouth daily.   b complex vitamins tablet Take 2 tablets by mouth at bedtime.   cefdinir 300 MG capsule Commonly known as: OMNICEF Take 1 capsule (300 mg total) by mouth every 12 (twelve) hours for 2 days.   docusate sodium 100 MG capsule Commonly known as:  COLACE Take 100-200 mg by mouth at bedtime.   donepezil 10 MG tablet Commonly known as: ARICEPT Take 10 mg by mouth daily.   escitalopram 20 MG tablet Commonly known as: LEXAPRO Take 30 mg by mouth daily.   HYDROcodone-acetaminophen 10-325 MG tablet Commonly known as: NORCO Take 1 tablet by mouth 2 (two) times daily as needed (pain).   lamoTRIgine 150 MG tablet Commonly known as: LAMICTAL Take 75-150 mg by mouth See admin instructions. Take 1/2 tablet (75 mg) by mouth every morning and 1 tablet (150 mg) at night   metroNIDAZOLE 500 MG tablet Commonly known as: FLAGYL Take 1 tablet (500 mg total) by mouth every 8 (eight) hours for 2 days.   nadolol 20 MG tablet Commonly known as: Corgard Take 0.5 tablets (10 mg total) by mouth daily.   nitroGLYCERIN 0.4 MG SL tablet Commonly known as: NITROSTAT Place 1 tablet (0.4 mg total) under the tongue every 5 (five) minutes as needed for chest pain.   omeprazole 20 MG capsule Commonly known as: PRILOSEC Take 1 capsule (20 mg total) by mouth 2 (two) times daily before a meal. What changed: when to take this   ondansetron 8 MG tablet Commonly known as: ZOFRAN Take 8 mg by mouth 2 (two) times daily as needed for nausea or vomiting.   predniSONE 10 MG tablet Commonly known as: DELTASONE Take 5 mg by mouth daily with breakfast.   rizatriptan 10 MG disintegrating tablet Commonly known as: MAXALT-MLT Take 10 mg by mouth See admin instructions. Take one tablet (10 mg) by mouth daily as needed for "floaters", May repeat in 2 hours if still needed   rosuvastatin 5 MG tablet Commonly known as: CRESTOR Take 1 tablet (5 mg total) by mouth daily.   traZODone 100 MG tablet Commonly known as: DESYREL Take 200 mg by mouth at bedtime.   vitamin C 1000 MG tablet Take 1,000 mg by mouth at bedtime.   Vitamin D-3 25 MCG (1000 UT) Caps Take 1,000 Units by mouth 2 (two) times daily.            Durable Medical Equipment  (From  admission, onward)         Start     Ordered   06/06/20 1821  For home use only DME 3 n 1  Once        06/06/20 1820          Follow-up Information    Michael Boston, MD Follow up on 06/17/2020.   Specialty: General Surgery Why: Your appointment is at 1:30 PM.  Be at the office 30 minutes early with family member.  Bring photo ID and insuracne information with you.   Contact information: [address] [place] [address] [phone]              Allergies  Allergen Reactions  . Carbamazepine Other (See Comments)    Parkinsons like symptoms tremors  . Sertraline Hcl Other (See Comments)    Unknown reaction    Consultations:  None   Procedures/Studies: CT ABDOMEN PELVIS  WO CONTRAST  Result Date: 06/04/2020 CLINICAL DATA:  Abdominal pain. EXAM: CT ABDOMEN AND PELVIS WITHOUT CONTRAST TECHNIQUE: Multidetector CT imaging of the abdomen and pelvis was performed following the standard protocol without IV contrast. COMPARISON:  05/14/2020 FINDINGS: Lower chest: Atelectasis or scarring noted right middle lobe. Dependent atelectasis noted lung bases bilaterally. Hepatobiliary: No focal abnormality in the liver on this study without intravenous contrast. Gallbladder is surgically absent. No intrahepatic or extrahepatic biliary dilation. Pancreas: No focal mass lesion. No dilatation of the main duct. No intraparenchymal cyst. No peripancreatic edema. Spleen: Insert spleen Adrenals/Urinary Tract: No adrenal nodule or mass. No gross renal mass lesion evident on this noncontrast CT scan. No evidence for urinary stone disease. Mild fullness of the right intrarenal collecting system is new in the interval. No gross hydroureter. Wall thickening noted along the bladder dome is associated with a tiny gas bubble in the bladder lumen. Stomach/Bowel: Small hiatal hernia. Stomach is unremarkable. No gastric wall thickening. No evidence of outlet obstruction. Duodenum is normally  positioned as is the ligament of Treitz. No small bowel wall thickening. No small bowel dilatation. The terminal ileum is normal. The appendix is not visualized, but there is no edema or inflammation in the region of the cecum. There is left colonic diverticulosis with subtle pericolonic edema/inflammation associated with the proximal sigmoid segment. 2 soft tissue tracks communicate between the proximal to mid sigmoid colon in the dome of bladder 1 of which goes from the anterior wall of the sigmoid colon to the anterior bladder dome and is best demonstrated on sagittal images 80-89. A second confluent area of soft tissue extends from the inferior wall of the sigmoid colon to the mid bladder no (sagittal 78/7) with gas and debris between the colon in the dome of bladder. Either one or both areas could represent colovesical fistula. The collection of debris and gas wedged between the inferior wall the sigmoid colon and mid bladder dome suggests para colonic abscess and this may be intramural in the bladder wall. Vascular/Lymphatic: No abdominal aortic aneurysm. There is no gastrohepatic or hepatoduodenal ligament lymphadenopathy. No retroperitoneal or mesenteric lymphadenopathy. No pelvic sidewall lymphadenopathy. Reproductive: Inferior pelvis obscured by beam hardening artifact from bilateral hip replacement, but no adnexal mass evident. Other: No intraperitoneal free fluid. Musculoskeletal: Status post bilateral hip replacement. No worrisome lytic or sclerotic osseous abnormality. T9 compression deformity again noted. IMPRESSION: 1. Left colonic diverticulosis with subtle pericolonic edema/inflammation associated with the proximal sigmoid segment. Imaging features are compatible with diverticulitis. 2. 2 different tracks of soft tissue between the proximal to mid sigmoid colon and the bladder dome. Either one or both areas could represent colovesical fistula. The collection of debris and gas wedged between the  inferior wall the sigmoid colon and mid bladder dome suggests para colonic abscess and this may be intramural in the bladder wall. Overall imaging features are similar to 05/14/2020. 3. Mild fullness of the right intrarenal collecting system without gross hydroureter. This finding is new since 05/14/2020. 4. Small hiatal hernia. 5. Status post bilateral hip replacement. Electronically Signed   By: Misty Stanley M.D.   On: 06/04/2020 07:24   CT ABDOMEN PELVIS WO CONTRAST  Result Date: 05/14/2020 CLINICAL DATA:  Epigastric pain.  Pain status post fall EXAM: CT ABDOMEN AND PELVIS WITHOUT CONTRAST TECHNIQUE: Multidetector CT imaging of the abdomen and pelvis was performed following the standard protocol without IV contrast. COMPARISON:  None. FINDINGS: Lower chest: The lung bases are clear.  The heart size is  normal. Hepatobiliary: The liver is normal. Status post cholecystectomy.There is no biliary ductal dilation. Pancreas: Normal contours without ductal dilatation. No peripancreatic fluid collection. Spleen: Unremarkable. Adrenals/Urinary Tract: --Adrenal glands: Unremarkable. --Right kidney/ureter: No hydronephrosis or radiopaque kidney stones. --Left kidney/ureter: No hydronephrosis or radiopaque kidney stones. --Urinary bladder: There is asymmetric urinary bladder wall thickening. There is a moderate to large volume of air within the urinary bladder. Stomach/Bowel: --Stomach/Duodenum: No hiatal hernia or other gastric abnormality. Normal duodenal course and caliber. --Small bowel: Unremarkable. --Colon: There is a large amount of stool throughout colon. There is a relatively abrupt transition point the level of the sigmoid colon where there are adjacent inflammatory changes circumferential a wall thickening. There is a questionable contained perforation of the sigmoid colon abutting the urinary bladder, measuring approximately 2.1 cm on the coronal series. This is not amenable to percutaneous drainage given  its small size. --Appendix: Not visualized. No right lower quadrant inflammation or free fluid. Vascular/Lymphatic: Normal course and caliber of the major abdominal vessels. --No retroperitoneal lymphadenopathy. --No mesenteric lymphadenopathy. --No pelvic or inguinal lymphadenopathy. Reproductive: Status post hysterectomy. No adnexal mass. Other: There is a small volume of free fluid in the patient's pelvis. The abdominal wall is normal. Musculoskeletal. There is an age-indeterminate compression fracture of the T9 vertebral body with approximately 20% height loss anteriorly. The patient is status post bilateral total hip arthroplasty. There is extensive streak artifact through the patient's pelvis that limits evaluation of this region. IMPRESSION: 1. Large amount of stool throughout the colon with an abrupt transition point at the sigmoid colon. At this location, there is circumferential wall thickening with adjacent inflammatory changes. Findings may be secondary to colitis/diverticulitis. There are findings suspicious for contained perforation abutting the urinary bladder. However, an underlying mass is not excluded and should be further evaluated with colonoscopy. 2. 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 3. Age-indeterminate compression fracture of the T9 vertebral body. Aortic Atherosclerosis (ICD10-I70.0). Electronically Signed   By: Constance Holster M.D.   On: 05/14/2020 18:29   DG Chest 2 View  Result Date: 05/14/2020 CLINICAL DATA:  Cough. EXAM: CHEST - 2 VIEW COMPARISON:  November 13, 2019. FINDINGS: The heart size and mediastinal contours are within normal limits. Both lungs are clear. The visualized skeletal structures are unremarkable. IMPRESSION: No active cardiopulmonary disease. Electronically Signed   By: Marijo Conception M.D.   On: 05/14/2020 18:22   DG Tibia/Fibula Right  Result Date:  06/04/2020 CLINICAL DATA:  Pain after a fall. EXAM: RIGHT TIBIA AND FIBULA - 2 VIEW COMPARISON:  None. FINDINGS: There is no evidence of fracture or other focal bone lesions. Soft tissues are unremarkable. IMPRESSION: Negative. Electronically Signed   By: Misty Stanley M.D.   On: 06/04/2020 05:17   CT Head Wo Contrast  Result Date: 06/03/2020 CLINICAL DATA:  Head trauma, coagulopathy.  Weakness. EXAM: CT HEAD WITHOUT CONTRAST TECHNIQUE: Contiguous axial images were obtained from the base of the skull through the vertex without intravenous contrast. COMPARISON:  CT head 02/20/2020 FINDINGS: Brain: No evidence of acute large vascular territory infarction, hemorrhage, hydrocephalus, extra-axial collection or mass lesion/mass effect. Redemonstrated partially empty sella. Vascular: No hyperdense vessel identified. Skull: No acute fracture. Sinuses/Orbits: Partially imaged right maxillary sinus retention cyst. Otherwise, the sinuses are clear. No acute orbital abnormality. Other: No mastoid effusions IMPRESSION: 1. No evidence of acute intracranial abnormality. 2. Redemonstrated partially empty sella, which may  be an incidental finding but can be seen with idiopathic intracranial hypertension in the correct clinical setting. Electronically Signed   By: Margaretha Sheffield MD   On: 06/03/2020 14:57   DG Knee Complete 4 Views Right  Result Date: 06/04/2020 CLINICAL DATA:  Pain after a fall. EXAM: RIGHT KNEE - COMPLETE 4+ VIEW COMPARISON:  None. FINDINGS: No fracture. No subluxation or dislocation. Marked loss of joint space noted medial compartment. Hypertrophic spurring evident in all 3 compartments with probable small joint effusion in the suprapatellar bursa. IMPRESSION: Tricompartmental degenerative changes without acute bony abnormality. Electronically Signed   By: Misty Stanley M.D.   On: 06/04/2020 05:16   (Echo, Carotid, EGD, Colonoscopy, ERCP)    Subjective: Patient seen and examined.  Sister at the  bedside.  Patient is frustrated resting in the hospital waiting for rehab and her insurance is not approving it.  She would like to go home and sister is available to help her. Complains of epigastric discomfort which is going on for long time.  No other events.   Discharge Exam: Vitals:   06/12/20 0826 06/12/20 1043  BP: (!) 99/54 120/62  Pulse: (!) 58 61  Resp: 19 18  Temp: 97.8 F (36.6 C)   SpO2: 100%    Vitals:   06/11/20 2001 06/12/20 0511 06/12/20 0826 06/12/20 1043  BP: (!) 132/57 (!) 103/50 (!) 99/54 120/62  Pulse: (!) 52 60 (!) 58 61  Resp: 18 18 19 18   Temp: 98.1 F (36.7 C) (!) 97.5 F (36.4 C) 97.8 F (36.6 C)   TempSrc: Oral Oral Oral   SpO2: 100% 98% 100%   Weight:      Height:        General: Pt is alert, awake, not in acute distress Patient is anxious.  She is on room air.  Chronically sick looking but not in any distress. Cardiovascular: RRR, S1/S2 +, no rubs, no gallops Respiratory: CTA bilaterally, no wheezing, no rhonchi Abdominal: Soft, NT, ND, bowel sounds +, obese and pendulous.  No localized tenderness. Extremities: no edema, no cyanosis PICC line left arm intact and dry.    The results of significant diagnostics from this hospitalization (including imaging, microbiology, ancillary and laboratory) are listed below for reference.     Microbiology: Recent Results (from the past 240 hour(s))  Respiratory Panel by RT PCR (Flu A&B, Covid) - Nasopharyngeal Swab     Status: None   Collection Time: 06/04/20  5:06 AM   Specimen: Nasopharyngeal Swab  Result Value Ref Range Status   SARS Coronavirus 2 by RT PCR NEGATIVE NEGATIVE Final    Comment: (NOTE) SARS-CoV-2 target nucleic acids are NOT DETECTED.  The SARS-CoV-2 RNA is generally detectable in upper respiratoy specimens during the acute phase of infection. The lowest concentration of SARS-CoV-2 viral copies this assay can detect is 131 copies/mL. A negative result does not preclude  SARS-Cov-2 infection and should not be used as the sole basis for treatment or other patient management decisions. A negative result may occur with  improper specimen collection/handling, submission of specimen other than nasopharyngeal swab, presence of viral mutation(s) within the areas targeted by this assay, and inadequate number of viral copies (<131 copies/mL). A negative result must be combined with clinical observations, patient history, and epidemiological information. The expected result is Negative.  Fact Sheet for Patients:  PinkCheek.be  Fact Sheet for Healthcare Providers:  GravelBags.it  This test is no t yet approved or cleared by the [place] and  has been authorized for detection and/or diagnosis of SARS-CoV-2 by FDA under an Emergency Use Authorization (EUA). This EUA will remain  in effect (meaning this test can be used) for the duration of the COVID-19 declaration under Section 564(b)(1) of the Act, 21 U.S.C. section 360bbb-3(b)(1), unless the authorization is terminated or revoked sooner.     Influenza A by PCR NEGATIVE NEGATIVE Final   Influenza B by PCR NEGATIVE NEGATIVE Final    Comment: (NOTE) The Xpert Xpress SARS-CoV-2/FLU/RSV assay is intended as an aid in  the diagnosis of influenza from Nasopharyngeal swab specimens and  should not be used as a sole basis for treatment. Nasal washings and  aspirates are unacceptable for Xpert Xpress SARS-CoV-2/FLU/RSV  testing.  Fact Sheet for Patients: PinkCheek.be  Fact Sheet for Healthcare Providers: GravelBags.it  This test is not yet approved or cleared by the [place] FDA and  has been authorized for detection and/or diagnosis of SARS-CoV-2 by  FDA under an Emergency Use Authorization (EUA). This EUA will remain  in effect (meaning this test can be used) for the duration of the   Covid-19 declaration under Section 564(b)(1) of the Act, 21  U.S.C. section 360bbb-3(b)(1), unless the authorization is  terminated or revoked. Performed at [facility], [place] [address], [place], [place] [address]   Urine culture     Status: Abnormal   Collection Time: 06/04/20  6:08 AM   Specimen: Urine, Random  Result Value Ref Range Status   Specimen Description URINE, RANDOM  Final   Special Requests   Final    NONE Performed at [facility], [place] [address], [place], [place] [address]    Culture >=100,000 COLONIES/mL ESCHERICHIA COLI (A)  Final   Report Status 06/05/2020 FINAL  Final   Organism ID, Bacteria ESCHERICHIA COLI (A)  Final      Susceptibility   Escherichia coli - MIC*    AMPICILLIN >=32 RESISTANT Resistant     CEFAZOLIN 16 SENSITIVE Sensitive     CEFTRIAXONE <=0.25 SENSITIVE Sensitive     CIPROFLOXACIN 1 SENSITIVE Sensitive     GENTAMICIN <=1 SENSITIVE Sensitive     IMIPENEM <=0.25 SENSITIVE Sensitive     NITROFURANTOIN <=16 SENSITIVE Sensitive     TRIMETH/SULFA <=20 SENSITIVE Sensitive     AMPICILLIN/SULBACTAM >=32 RESISTANT Resistant     PIP/TAZO <=4 SENSITIVE Sensitive     * >=100,000 COLONIES/mL ESCHERICHIA COLI  Blood culture (routine x 2)     Status: None   Collection Time: 06/04/20  8:23 AM   Specimen: BLOOD  Result Value Ref Range Status   Specimen Description BLOOD RIGHT ANTECUBITAL  Final   Special Requests   Final    BOTTLES DRAWN AEROBIC ONLY Blood Culture results may not be optimal due to an inadequate volume of blood received in culture bottles   Culture   Final    NO GROWTH 5 DAYS Performed at [facility], [place] [address], [place], [place] [address]    Report Status 06/09/2020 FINAL  Final  Blood culture (routine x 2)     Status: None   Collection Time: 06/04/20  8:59 AM   Specimen: BLOOD LEFT HAND  Result Value Ref Range Status   Specimen Description BLOOD LEFT HAND  Final   Special Requests   Final    BOTTLES DRAWN  AEROBIC ONLY Blood Culture results may not be optimal due to an inadequate volume of blood received in culture bottles   Culture  Final    NO GROWTH 5 DAYS Performed at [facility], [place] [address], [place], [place] [address]    Report Status 06/09/2020 FINAL  Final     Labs: BNP (last 3 results) No results for input(s): BNP in the last 8760 hours. Basic Metabolic Panel: Recent Labs  Lab 06/06/20 0409 06/07/20 0346 06/08/20 0400 06/09/20 0414 06/10/20 0418  NA 140 144 141 141 143  K 4.0 4.4 4.1 3.9 3.8  CL 106 109 107 111 112*  CO2 31 32 28 26 23   GLUCOSE 123* 117* 118* 148* 145*  BUN 17 23* 35* 41* 45*  CREATININE 1.45* 1.42* 1.39* 1.36* 1.45*  CALCIUM 10.5* 10.7* 10.4* 10.1 9.8  MG 2.1 2.1 2.0 2.0 2.1  PHOS 2.2* 2.5 2.7 2.5 3.1   Liver Function Tests: Recent Labs  Lab 06/06/20 0409 06/09/20 0414 06/10/20 0418  AST 13* 16 18  ALT 12 16 17   ALKPHOS 69 65 64  BILITOT 0.2* 0.4 0.5  PROT 4.7* 4.6* 4.9*  ALBUMIN 2.1* 2.3* 2.4*   No results for input(s): LIPASE, AMYLASE in the last 168 hours. No results for input(s): AMMONIA in the last 168 hours. CBC: Recent Labs  Lab 06/06/20 0409 06/07/20 0346 06/08/20 0400 06/09/20 0414 06/10/20 0418  WBC 8.2 6.8 7.1 7.3 7.8  NEUTROABS 6.1 4.6 5.1 5.1  --   HGB 9.1* 9.1* 8.6* 8.1* 8.4*  HCT 30.3* 30.4* 27.9* 26.9* 27.6*  MCV 110.2* 110.1* 110.7* 111.2* 111.7*  PLT 155 174 194 199 208   Cardiac Enzymes: No results for input(s): CKTOTAL, CKMB, CKMBINDEX, TROPONINI in the last 168 hours. BNP: Invalid input(s): POCBNP CBG: Recent Labs  Lab 06/11/20 2002 06/12/20 0040 06/12/20 0512 06/12/20 0828 06/12/20 1133  GLUCAP 179* 182* 155* 96 135*   D-Dimer No results for input(s): DDIMER in the last 72 hours. Hgb A1c No results for input(s): HGBA1C in the last 72 hours. Lipid Profile Recent Labs    06/10/20 0418  TRIG 146   Thyroid function studies No results for input(s): TSH, T4TOTAL, T3FREE, THYROIDAB  in the last 72 hours.  Invalid input(s): FREET3 Anemia work up No results for input(s): VITAMINB12, FOLATE, FERRITIN, TIBC, IRON, RETICCTPCT in the last 72 hours. Urinalysis    Component Value Date/Time   COLORURINE YELLOW 06/03/2020 1405   APPEARANCEUR CLOUDY (A) 06/03/2020 1405   LABSPEC 1.004 (L) 06/03/2020 1405   PHURINE 6.0 06/03/2020 1405   GLUCOSEU NEGATIVE 06/03/2020 1405   HGBUR MODERATE (A) 06/03/2020 1405   BILIRUBINUR NEGATIVE 06/03/2020 1405   KETONESUR NEGATIVE 06/03/2020 1405   PROTEINUR NEGATIVE 06/03/2020 1405   NITRITE POSITIVE (A) 06/03/2020 1405   LEUKOCYTESUR LARGE (A) 06/03/2020 1405   Sepsis Labs Invalid input(s): PROCALCITONIN,  WBC,  LACTICIDVEN Microbiology Recent Results (from the past 240 hour(s))  Respiratory Panel by RT PCR (Flu A&B, Covid) - Nasopharyngeal Swab     Status: None   Collection Time: 06/04/20  5:06 AM   Specimen: Nasopharyngeal Swab  Result Value Ref Range Status   SARS Coronavirus 2 by RT PCR NEGATIVE NEGATIVE Final    Comment: (NOTE) SARS-CoV-2 target nucleic acids are NOT DETECTED.  The SARS-CoV-2 RNA is generally detectable in upper respiratoy specimens during the acute phase of infection. The lowest concentration of SARS-CoV-2 viral copies this assay can detect is 131 copies/mL. A negative result does not preclude SARS-Cov-2 infection and should not be used as the sole basis for treatment or other patient management decisions. A negative result may  occur with  improper specimen collection/handling, submission of specimen other than nasopharyngeal swab, presence of viral mutation(s) within the areas targeted by this assay, and inadequate number of viral copies (<131 copies/mL). A negative result must be combined with clinical observations, patient history, and epidemiological information. The expected result is Negative.  Fact Sheet for Patients:  PinkCheek.be  Fact Sheet for Healthcare  Providers:  GravelBags.it  This test is no t yet approved or cleared by the [place] FDA and  has been authorized for detection and/or diagnosis of SARS-CoV-2 by FDA under an Emergency Use Authorization (EUA). This EUA will remain  in effect (meaning this test can be used) for the duration of the COVID-19 declaration under Section 564(b)(1) of the Act, 21 U.S.C. section 360bbb-3(b)(1), unless the authorization is terminated or revoked sooner.     Influenza A by PCR NEGATIVE NEGATIVE Final   Influenza B by PCR NEGATIVE NEGATIVE Final    Comment: (NOTE) The Xpert Xpress SARS-CoV-2/FLU/RSV assay is intended as an aid in  the diagnosis of influenza from Nasopharyngeal swab specimens and  should not be used as a sole basis for treatment. Nasal washings and  aspirates are unacceptable for Xpert Xpress SARS-CoV-2/FLU/RSV  testing.  Fact Sheet for Patients: PinkCheek.be  Fact Sheet for Healthcare Providers: GravelBags.it  This test is not yet approved or cleared by the [place] FDA and  has been authorized for detection and/or diagnosis of SARS-CoV-2 by  FDA under an Emergency Use Authorization (EUA). This EUA will remain  in effect (meaning this test can be used) for the duration of the  Covid-19 declaration under Section 564(b)(1) of the Act, 21  U.S.C. section 360bbb-3(b)(1), unless the authorization is  terminated or revoked. Performed at [facility], [place] [address] [place], [place] [address]   Urine culture     Status: Abnormal   Collection Time: 06/04/20  6:08 AM   Specimen: Urine, Random  Result Value Ref Range Status   Specimen Description URINE, RANDOM  Final   Special Requests   Final    NONE Performed at [facility], [place] [address] [place], [place] [address]    Culture >=100,000 COLONIES/mL ESCHERICHIA COLI (A)  Final   Report Status 06/05/2020 FINAL  Final    Organism ID, Bacteria ESCHERICHIA COLI (A)  Final      Susceptibility   Escherichia coli - MIC*    AMPICILLIN >=32 RESISTANT Resistant     CEFAZOLIN 16 SENSITIVE Sensitive     CEFTRIAXONE <=0.25 SENSITIVE Sensitive     CIPROFLOXACIN 1 SENSITIVE Sensitive     GENTAMICIN <=1 SENSITIVE Sensitive     IMIPENEM <=0.25 SENSITIVE Sensitive     NITROFURANTOIN <=16 SENSITIVE Sensitive     TRIMETH/SULFA <=20 SENSITIVE Sensitive     AMPICILLIN/SULBACTAM >=32 RESISTANT Resistant     PIP/TAZO <=4 SENSITIVE Sensitive     * >=100,000 COLONIES/mL ESCHERICHIA COLI  Blood culture (routine x 2)     Status: None   Collection Time: 06/04/20  8:23 AM   Specimen: BLOOD  Result Value Ref Range Status   Specimen Description BLOOD RIGHT ANTECUBITAL  Final   Special Requests   Final    BOTTLES DRAWN AEROBIC ONLY Blood Culture results may not be optimal due to an inadequate volume of blood received in culture bottles   Culture   Final    NO GROWTH 5 DAYS Performed at [facility], [place] [address], [place], [place] [address]  Report Status 06/09/2020 FINAL  Final  Blood culture (routine x 2)     Status: None   Collection Time: 06/04/20  8:59 AM   Specimen: BLOOD LEFT HAND  Result Value Ref Range Status   Specimen Description BLOOD LEFT HAND  Final   Special Requests   Final    BOTTLES DRAWN AEROBIC ONLY Blood Culture results may not be optimal due to an inadequate volume of blood received in culture bottles   Culture   Final    NO GROWTH 5 DAYS Performed at [facility], [place] [address] [place], [place] [address]    Report Status 06/09/2020 FINAL  Final     Time coordinating discharge: 40 minutes  SIGNED:   Barb Merino, MD  [facility] 06/12/2020, 3:04 PM

## 2020-06-12 NOTE — Plan of Care (Signed)
  Problem: Education: Goal: Knowledge of General Education information will improve Description: Including pain rating scale, medication(s)/side effects and non-pharmacologic comfort measures 06/12/2020 2042 by Berta Minor, RN Outcome: Progressing 06/12/2020 2042 by Berta Minor, RN Outcome: Progressing   Problem: Health Behavior/Discharge Planning: Goal: Ability to manage health-related needs will improve 06/12/2020 2042 by Berta Minor, RN Outcome: Progressing 06/12/2020 2042 by Berta Minor, RN Outcome: Progressing   Problem: Clinical Measurements: Goal: Ability to maintain clinical measurements within normal limits will improve 06/12/2020 2042 by Berta Minor, RN Outcome: Progressing 06/12/2020 2042 by Berta Minor, RN Outcome: Progressing Goal: Will remain free from infection 06/12/2020 2042 by Berta Minor, RN Outcome: Progressing 06/12/2020 2042 by Berta Minor, RN Outcome: Progressing Goal: Diagnostic test results will improve 06/12/2020 2042 by Berta Minor, RN Outcome: Progressing 06/12/2020 2042 by Berta Minor, RN Outcome: Progressing Goal: Respiratory complications will improve 06/12/2020 2042 by Berta Minor, RN Outcome: Progressing 06/12/2020 2042 by Berta Minor, RN Outcome: Progressing Goal: Cardiovascular complication will be avoided 06/12/2020 2042 by Berta Minor, RN Outcome: Progressing 06/12/2020 2042 by Berta Minor, RN Outcome: Progressing   Problem: Activity: Goal: Risk for activity intolerance will decrease 06/12/2020 2042 by Berta Minor, RN Outcome: Progressing 06/12/2020 2042 by Berta Minor, RN Outcome: Progressing   Problem: Nutrition: Goal: Adequate nutrition will be maintained 06/12/2020 2042 by Berta Minor, RN Outcome: Progressing 06/12/2020 2042 by Berta Minor, RN Outcome:  Progressing   Problem: Coping: Goal: Level of anxiety will decrease 06/12/2020 2042 by Berta Minor, RN Outcome: Progressing 06/12/2020 2042 by Berta Minor, RN Outcome: Progressing   Problem: Elimination: Goal: Will not experience complications related to bowel motility 06/12/2020 2042 by Berta Minor, RN Outcome: Progressing 06/12/2020 2042 by Berta Minor, RN Outcome: Progressing Goal: Will not experience complications related to urinary retention 06/12/2020 2042 by Berta Minor, RN Outcome: Progressing 06/12/2020 2042 by Berta Minor, RN Outcome: Progressing   Problem: Pain Managment: Goal: General experience of comfort will improve 06/12/2020 2042 by Berta Minor, RN Outcome: Progressing 06/12/2020 2042 by Berta Minor, RN Outcome: Progressing   Problem: Safety: Goal: Ability to remain free from injury will improve 06/12/2020 2042 by Berta Minor, RN Outcome: Progressing 06/12/2020 2042 by Berta Minor, RN Outcome: Progressing   Problem: Skin Integrity: Goal: Risk for impaired skin integrity will decrease 06/12/2020 2042 by Berta Minor, RN Outcome: Progressing 06/12/2020 2042 by Berta Minor, RN Outcome: Progressing

## 2020-06-12 NOTE — Progress Notes (Signed)
Physical Therapy Treatment Patient Details Name: Valerie [name] MRN: [id number] DOB: 1960-10-03 Today's Date: 06/12/2020    History of Present Illness Pt is a 59 y/o female admitted secondary to progressive weakness and frequent falls. 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 CT abdomen/pelvis revealed left colonic diverticulosis with pericolonic edema/inflammation concerning for diverticulitis.  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 PMH includes bipolar disorder, CAD, DM, CKD, and psoriatic arthritis.     PT Comments    Pt making good progress toward mobility goals, able to perform gait trial up to 134ft using RW prior to needing seated break with min guard at most and min guard to supervision for transfers. Pt needing min cues for safety awareness/to check if chair is locked prior to sitting and would benefit from Supervision with all mobility at home for safety if pt instead D/C home. Pt continues to benefit from PT services to progress toward functional mobility goals. D/C recs below remain appropriate, pending progress.   Follow Up Recommendations  SNF     Equipment Recommendations  None recommended by PT    Recommendations for Other Services       Precautions / Restrictions Precautions Precautions: Fall Precaution Comments: mult falls with R knee instabiltiy Restrictions Weight Bearing Restrictions: No    Mobility  Bed Mobility Overal bed mobility: Modified Independent             General bed mobility comments: pt sitting EOB upon arrival  Transfers Overall transfer level: Needs assistance Equipment used: Rolling walker (2 wheeled) Transfers: Sit to/from Stand Sit to Stand: Min guard         General transfer comment: cues for hand placement  Ambulation/Gait Ambulation/Gait assistance: Min guard Gait Distance (Feet): 150 Feet (176ft, seated break, then 113ft) Assistive device: Rolling walker (2  wheeled) Gait Pattern/deviations: Step-through pattern;Decreased stride length;Decreased weight shift to right Gait velocity: decreased Gait velocity interpretation: <1.31 ft/sec, indicative of household ambulator General Gait Details: occasional mild R knee buckling/instability but able to correct with RW support and min guard   Stairs Stairs:  (verbal review of sequencing)           Wheelchair Mobility    Modified Rankin (Stroke Patients Only)       Balance Overall balance assessment: Needs assistance Sitting-balance support: Feet supported Sitting balance-Leahy Scale: Good     Standing balance support: Bilateral upper extremity supported;During functional activity Standing balance-Leahy Scale: Poor Standing balance comment: mild to moderate BUE support of RW during dynamic standing tasks                            Cognition Arousal/Alertness: Awake/alert Behavior During Therapy: WFL for tasks assessed/performed Overall Cognitive Status: Within Functional Limits for tasks assessed Area of Impairment: Attention;Memory;Safety/judgement                     Memory: Decreased short-term memory Following Commands: Follows one step commands consistently Safety/Judgement: Decreased awareness of safety   Problem Solving: Requires verbal cues General Comments: overall for simple mobility tasks, noted some decreased safety awareness such as not checking if recliner chair locked prior to sitting down      Exercises      General Comments General comments (skin integrity, edema, etc.): pt reports increased feeling of stability after transverse friction massage to R lateral knee      Pertinent Vitals/Pain Pain Assessment: Faces Faces Pain Scale: Hurts little more  Pain Location: R knee with gait Pain Descriptors / Indicators: Grimacing;Guarding Pain Intervention(s): Monitored during session;Repositioned;Other (comment) (pt given gentle transverse  friction massage to R knee)    Home Living                      Prior Function            PT Goals (current goals can now be found in the care plan section) Acute Rehab PT Goals Patient Stated Goal: to get stronger PT Goal Formulation: With patient Time For Goal Achievement: 06/18/20 Potential to Achieve Goals: Good Progress towards PT goals: Progressing toward goals    Frequency    Min 3X/week      PT Plan Current plan remains appropriate    Co-evaluation              AM-PAC PT "6 Clicks" Mobility   Outcome Measure  Help needed turning from your back to your side while in a flat bed without using bedrails?: None Help needed moving from lying on your back to sitting on the side of a flat bed without using bedrails?: None Help needed moving to and from a bed to a chair (including a wheelchair)?: A Little Help needed standing up from a chair using your arms (e.g., wheelchair or bedside chair)?: A Little Help needed to walk in hospital room?: A Little Help needed climbing 3-5 steps with a railing? : A Lot 6 Click Score: 19    End of Session Equipment Utilized During Treatment: Gait belt Activity Tolerance: Patient tolerated treatment well Patient left: in chair;with call bell/phone within reach Nurse Communication: Mobility status PT Visit Diagnosis: Muscle weakness (generalized) (M62.81);History of falling (Z91.81);Unsteadiness on feet (R26.81)     Time: 8469-6295 PT Time Calculation (min) (ACUTE ONLY): 18 min  Charges:  $Gait Training: 8-22 mins                     Anslee Micheletti P., PTA Acute Rehabilitation Services Pager: [phone] Office: [place] 06/12/2020, 4:17 PM

## 2020-06-12 NOTE — Progress Notes (Signed)
PHARMACY - TOTAL PARENTERAL NUTRITION CONSULT NOTE   Indication: Colovesicular Fistula  Patient Measurements: Height: 5\' 2"  (157.5 cm) Weight: 90.3 kg (199 lb) IBW/kg (Calculated) : 50.1 TPN AdjBW (KG): 60.1 Body mass index is 36.4 kg/m. Usual Weight: 104kg  Assessment:  59 yo W recently admitted 05/14/20 for fistula; now readmitted with worsening weakness, falls, lethargy, nausea, confusion and poor PO intake. PICC was placed outpatient 10/7 for TPN with first TPN to start 10/12. Plan for colectomy in 4-6 weeks after weaning off immunosuppresants for psoriatic arthritis. Poor PO intake since ~05/08/20 with documented weight loss of ~14% over 3-6 months.  Glucose / Insulin: CBGs 82-182 over 24 hrs, utilized 6 units SSI in last 24hrs. On chronic prednisone 10mg  (psoriatic arthritis). A1C 4.9.  Electrolytes: all WNL except corrected Ca high ~10.9 (improved), Cl 112 Renal: AKI improving - Scr 1.45 stable, BUN up to 45. LFTs / TGs: LFTs / Tbili / TG WNL Prealbumin / albumin: Prealbumin up 26.8; albumin up 2.4 Intake / Output; MIVF: UOP not measured, LBM 10/19 ID: UTI. Day #9 total Zosyn>Cefdinir. Flagyl for diverticulitis. GI Imaging:  9/21 CT Abd: Large stool burden, possible colitis/diverticulitis, highly suspicious colovesicular fistula  10/12 CT Abd: L colonic diverticulitis, likely colovesical fistula and paracolonic abscess Surgeries / Procedures:  9/24 Flex sig: sigmoid stricture and diverticulosis   Central access: 10/7 PICC  TPN start date: 10/12   Nutritional Goals (per RD recommendation 10/13) kCal: 1800-2000, Protein: 110-125g, Fluid: > 1.8 L/d Goal TPN rate is 80 mL/hr (provides 115g of protein and 1970 kcals per day)   Current Nutrition:  TPN, regular diet -0 documented 10/19.  Plan:  Continue 22-VV cyclic TPN at 6122 with CBGs controlled <200. - Cycle 1924 ml over 12hrs: 87 ml/hr x 1 hr; then 175 ml/hr x 10 hrs; then 87 ml/hr x 1 hr - Will provide 115g AA, 269g  Dextrose, 60g Lipid, and 1974 kCal; meeting 100% of patient protein and kCal needs Electrolytes in TPN: Continue 75 mEq/L of Na, 28 mEq/L of K; 0 mEq/L of Ca, 44mEq/L of Mg, 17 mmol/L of Phos; Cl:Ac ratio 1:1 Watch high Ca (improved) Add standard MVI and trace elements to TPN Continue custom cyclic 4x daily Sensitive SSI and adjust as needed Monitor TPN labs Mon/Thurs and prn Surgery planning to continue TPN at SNF when bed available vs home with [place]. Alford Highland, PharmD, BCPS Clinical Staff Pharmacist [email or web address] Clinical phone for 06/12/2020 until 3p is [phone] 06/12/2020 8:44 AM  **Pharmacist phone directory can be found on [email or web address] listed under [place]**

## 2020-06-12 NOTE — Progress Notes (Signed)
RT assisted patient with home CPAP set up, patient will self place when ready.

## 2020-06-12 NOTE — Care Management (Addendum)
Left message for [name] with [facility] ( [facility]fusion [facility]) regarding discharge today. [name] returned call. She is requesting NCM to fax recent lab results,  Dietitian notes, MD progress notes, Recent TPN orders to [phone].  Also notified Amy with [facility] regarding discharge today. She will contact office and call NCM back.   Resumption of care orders for [facility] and HHPT entered . Amy with [facility] aware.   Just waiting for [name] with [facility] to review fax and call back.  Timpson with [facility] called back. They have everything they need for resumption of home TPN , supplies will be delivered tonight.   NCM spoke to patient and sister Tye [place] at bedside. They have supplies and TPN at home, however they want to ensure a [place] visit tonight because they have never started administration of TPN at home before. Tye [place] would like a call from [facility] prior to discharging to confirm a visit tonight.   NCM called Amy with [facility] back  Explained above. Amy will call office and call NCM and Cathy back.   Amy with [facility] returned call. They are unable to provide [facility] visit tonight. [facility] asking NCM to call [facility] and see if they have an infusion nurse to visit patient at home tonight. If [facility] does not have a nurse to visit tonight Amy with [facility] asking if patient could stay in hospital tonight receive TPN , then be discharged home tomorrow morning and [facility] will see patient at home tomorrow.   NCM has called [facility] awaiting call back. [name] with [facility] returned call. They do not have a nurse to see patient tonight. Spoke with patient sister Tye [place] . Explained above.   Patient will stay here at hospital tonight receive TPN and be discharged in morning. [facility] will see patient at home tomorrow afternoon. Tye [place] wanting time of visit. NCM explained to [facility] NCM has provided [facility] with Cathy's contact number and asked [facility] to call  [facility] directly. Cathy voiced understanding. [name] with [facility] will call Cathy directly also.   Magdalen Spatz RN

## 2020-06-13 DIAGNOSIS — E440 Moderate protein-calorie malnutrition: Secondary | ICD-10-CM | POA: Diagnosis not present

## 2020-06-13 DIAGNOSIS — G4733 Obstructive sleep apnea (adult) (pediatric): Secondary | ICD-10-CM | POA: Diagnosis not present

## 2020-06-13 DIAGNOSIS — N321 Vesicointestinal fistula: Secondary | ICD-10-CM | POA: Diagnosis not present

## 2020-06-13 LAB — COMPREHENSIVE METABOLIC PANEL
ALT: 13 U/L (ref 0–44)
AST: 12 U/L — ABNORMAL LOW (ref 15–41)
Albumin: 2.3 g/dL — ABNORMAL LOW (ref 3.5–5.0)
Alkaline Phosphatase: 62 U/L (ref 38–126)
Anion gap: 5 (ref 5–15)
BUN: 40 mg/dL — ABNORMAL HIGH (ref 6–20)
CO2: 23 mmol/L (ref 22–32)
Calcium: 9.3 mg/dL (ref 8.9–10.3)
Chloride: 114 mmol/L — ABNORMAL HIGH (ref 98–111)
Creatinine, Ser: 1.18 mg/dL — ABNORMAL HIGH (ref 0.44–1.00)
GFR, Estimated: 50 mL/min — ABNORMAL LOW (ref >60)
Glucose, Bld: 141 mg/dL — ABNORMAL HIGH (ref 70–99)
Potassium: 4.3 mmol/L (ref 3.5–5.1)
Sodium: 142 mmol/L (ref 135–145)
Total Bilirubin: 0.3 mg/dL (ref 0.3–1.2)
Total Protein: 4.7 g/dL — ABNORMAL LOW (ref 6.5–8.1)

## 2020-06-13 LAB — PHOSPHORUS: Phosphorus: 3 mg/dL (ref 2.5–4.6)

## 2020-06-13 LAB — GLUCOSE, CAPILLARY
Glucose-Capillary: 157 mg/dL — ABNORMAL HIGH (ref 70–99)
Glucose-Capillary: 81 mg/dL (ref 70–99)

## 2020-06-13 LAB — MAGNESIUM: Magnesium: 1.9 mg/dL (ref 1.7–2.4)

## 2020-06-13 MED ORDER — TRAVASOL 10 % IV SOLN
INTRAVENOUS | Status: DC
  Filled 2020-06-13: qty 1159.06

## 2020-06-13 NOTE — Progress Notes (Signed)
Patient was seen and examined.  No overnight events.  She was sleeping nicely wearing her CPAP machine.  She could not be discharged yesterday due to unavailability of TPN at home. Today, home health arrangements has been made and patient is stable to discharge. Reviewed with her about discharge planning medications.  Discharge summary from 10/20 remains unchanged.  Physical Exam Constitutional:      Appearance: Normal appearance. She is obese.  HENT:     Head: Normocephalic.     Mouth/Throat:     Mouth: Mucous membranes are moist.  Cardiovascular:     Heart sounds: Normal heart sounds.  Pulmonary:     Breath sounds: Normal breath sounds.  Abdominal:     Palpations: Abdomen is soft.     Comments: Obese and pendulous.  Musculoskeletal:     Cervical back: Neck supple.  Psychiatric:        Mood and Affect: Mood normal.        Behavior: Behavior normal.    Total time spent: 15 minutes.

## 2020-06-13 NOTE — Progress Notes (Signed)
Patient verbalized understanding of d/cinstructions. D/c home with daughter.

## 2020-06-13 NOTE — Progress Notes (Addendum)
PHARMACY - TOTAL PARENTERAL NUTRITION CONSULT NOTE  Indication: Colovesicular fistula  Patient Measurements: Height: 5\' 2"  (157.5 cm) Weight: 90.3 kg (199 lb) IBW/kg (Calculated) : 50.1 TPN AdjBW (KG): 60.1 Body mass index is 36.4 kg/m. Usual Weight: 104kg  Assessment:  78 yoF recently admitted 05/14/20 for fistula; now readmitted with worsening weakness, falls, lethargy, nausea, confusion and poor PO intake. PICC was placed outpatient 10/7 for TPN with first TPN to start 10/12. Plan for colectomy in 4-6 weeks after weaning off immunosuppresants for psoriatic arthritis. Poor PO intake since ~05/08/20 with documented weight loss of ~14% over 3-6 months.  Glucose / Insulin: A1c 4.9% - CBGs 152-172 on TPN, 81-141 off TPN (controlled) Utilized 4 units SSI in last 24hrs Chronic prednisone 10mg /d for psoriatic arthritis. Electrolytes: CL 114, CoCa 10.66 (none in TPN), others WNL Renal: AKI improving - SCr down 1.18, BUN down to 40 LFTs / TGs: LFTs / tbili / TG WNL Prealbumin / albumin: Prealbumin up 26.8; albumin 2.3 Intake / Output; MIVF: UOP not measured, LBM 10/19 ID: UTI. Day #10 total Zosyn>Cefdinir. Flagyl for diverticulitis. GI Imaging:  9/21 CT - large stool burden, possible colitis/diverticulitis, highly suspicious colovesicular fistula  10/12 CT - L colonic diverticulitis, likely colovesical fistula and paracolonic abscess Surgeries / Procedures:  9/24 Flex sig - sigmoid stricture and diverticulosis   Central access: 10/7 PICC  TPN start date: 06/04/20  Nutritional Goals (per RD rec 10/13) kCal: 1800-2000, Protein: 110-125g, Fluid: > 1.8 L/d  Current Nutrition:  TPN Soft diet - consume up to 75% of meals  Plan:  Tweak TPN to adjust Cl:Ac ratio  Continue 42-VZ cyclic TPN, cycle 5638 ml over 12hrs: 89 ml/hr x 1 hr; then 177 ml/hr x 10 hrs; then 89 ml/hr x 1 hr TPN provides 116g AA, 263g CHO, 59g ILE for a total of 1952 kCal, meeting 100% of patient needs Electrolytes in  TPN: Na 75 mEq/L, K 28 mEq/L, no Ca, Mag 12mEq/L, Phos 17 mmol/L, change Cl:Ac to 1:2 Add standard MVI and trace elements to TPN Continue custom cyclic 4x daily, sensitive SSI  Monitor TPN labs Mon/Thurs and PRN Surgery planning to continue TPN at SNF when bed available vs home with [facility]. Mina Marble, PharmD, BCPS, [place] 06/13/2020, 8:35 AM  ==========================================  Addendum: Patient to be discharged per RN's progress note Pharmacy will not prepare a TPN bag for today Home health consult entered for [facility] to manage TPN per protocol  Stephaie Dardis D. Mina Marble, PharmD, BCPS, [place] 06/13/2020, 11:26 AM

## 2020-06-17 ENCOUNTER — Other Ambulatory Visit (HOSPITAL_COMMUNITY): Payer: Self-pay | Admitting: Surgery

## 2020-06-17 ENCOUNTER — Ambulatory Visit: Payer: Self-pay | Admitting: Surgery

## 2020-06-17 ENCOUNTER — Ambulatory Visit (HOSPITAL_COMMUNITY): Admission: RE | Admit: 2020-06-17 | Payer: Medicare Other | Source: Ambulatory Visit

## 2020-06-17 DIAGNOSIS — R609 Edema, unspecified: Secondary | ICD-10-CM

## 2020-06-17 NOTE — H&P (View-Only) (Signed)
[facility]erie [name] Appointment: 06/17/2020 1:30 PM Location: [name] Patient #: [id number] DOB: Dec 08, 1960 Single / Language: [name] / Race: White Female  History of Present Illness Valerie Hector MD; 06/17/2020 5:42 PM) The patient is a 59 year old female who presents with colovesical fistula. Note for "Colovesical fistula": ` ` ` Patient sent for surgical consultation at the request of Valerie Valerie [name]  Chief Complaint: Colovesical fistula with multiple medical problems ` ` The patient is a obese woman with numerous health issues. Chronically immunosuppressed on steroids and Biologics for psoriatic arthritis - intermittently on prednisone, methotrexate, Enbrel, plaque Valerie [name], bipolar disorder. Had a stroke and myocardial infarction in March. Catheterization data but no stenting done. Felt to be not amenable to more aggressive intervention. Medical therapy. Chronic kidney disease. Diabetes. Had been on anticoagulation. [name] care physician in [place] - Valerie [name]. She is to live down there by Valerie [name]. She has had care at numerous health centers in the region. Rheumatology management and immunosuppression per her [name] care physician.   Patient had a history of fall. Narcotics. Pain medications. Constipation. Recurrent urinary tract infections. Had worsening nausea or vomiting and hematuria. Admitted in September. Colovesical fistula suspected. Consultations main. Flexible sigmoidoscopy noting thickening and stricture but no malignancy. Improved with antibiotics. Recommendation for outpatient follow-up and try to hold immunosuppressive. Then she bounced back in the hospital a few weeks ago with failure to thrive. Placed on IV parenteral nutrition through a PICC line. Antibiotics. Steadily improved. Recommended to go to rehabilitation. She comes in to the office less than a week from her discharge. She notes she normally would have a bowel  movement at least once a day. Sometimes up to 4 times a day. Intermittent constipation. He comes in a wheelchair. Her daughter is with her trying to help with overall healthcare. She claims she can walk maybe a block before she has to stop. Her appetite has been poor. She has been getting stronger on the IV nutrition though. Patient and daughter note that her legs are swelling and she has some swelling in her right upper extremity as well. She is off all immunosuppression right now and is just on prednisone only. She is down to 10 mg a day. He had been initially consulted, and recommended holding prednisone and considering just using Enbrel or methotrexate to control her psoriatic arthritis. Looks like she is somewhat insulin-requiring. She notes she is not taking her Plavix since her last discharge for some reason. She does not know why.   (Review of systems as stated in this history (HPI) or in the review of systems. Otherwise all other 12 point ROS are negative) ` ` ###########################################`  This patient encounter took 80 minutes today to perform the following: obtain history, perform exam, review outside records, interpret tests & imaging, counsel the patient on their diagnosis; and, document this encounter, including findings & plan in the electronic health record (EHR).   Allergies (Valerie [name], CMA; 06/17/2020 1:42 PM) TEGretol *ANTICONVULSANTS* Sertraline HCl *ANTIDEPRESSANTS* carBAMazepine *ANTICONVULSANTS* Allergies Reconciled  Medication History (Valerie [name], CMA; 06/17/2020 1:43 PM) ALPRAZolam (1MG  Tablet, Oral) Active. Cefdinir (300MG  Capsule, Oral) Active. Donepezil HCl (10MG  Tablet, Oral) Active. Escitalopram Oxalate (20MG  Tablet, Oral) Active. HYDROcodone-Acetaminophen (10-325MG  Tablet, Oral) Active. lamoTRIgine (150MG  Tablet, Oral) Active. metroNIDAZOLE (500MG  Tablet, Oral) Active. Nadolol (20MG  Tablet, Oral)  Active. Nitroglycerin (0.4MG  Tab Sublingual, Sublingual) Active. Ondansetron HCl (8MG  Tablet, Oral) Active. predniSONE (10MG  Tablet, Oral) Active. Rizatriptan Benzoate (10MG  Tablet, Oral) Active. Rosuvastatin Calcium (5MG  Tablet,  Oral) Active. traZODone HCl (100MG  Tablet, Oral) Active. Medications Reconciled    Vitals (Valerie [name] CMA; 06/17/2020 1:44 PM) 06/17/2020 1:43 PM Weight: 219.25 lb Height: 62in Body Surface Area: 1.99 m Body Mass Index: 40.1 kg/m  Temp.: 97.71F  Pulse: 74 (Regular)         Physical Exam Valerie Hector MD; 06/17/2020 5:44 PM)  General Mental Status-Alert. General Appearance-Not in acute distress, Not Sickly. Orientation-Oriented X3. Hydration-Well hydrated. Voice-Normal. Note: Sitting wheelchair. Calm. Not toxic. Not sickly. Can slowly climb up to the examination table with one person assist for balance  Integumentary Global Assessment Upon inspection and palpation of skin surfaces of the - Axillae: non-tender, no inflammation or ulceration, no drainage. and Distribution of scalp and body hair is normal. General Characteristics Temperature - normal warmth is noted.  Head and Neck Head-normocephalic, atraumatic with no lesions or palpable masses. Face Global Assessment - atraumatic, no absence of expression. Neck Global Assessment - no abnormal movements, no bruit auscultated on the right, no bruit auscultated on the left, no decreased range of motion, non-tender. Trachea-midline. Thyroid Gland Characteristics - non-tender.  Eye Eyeball - Left-Extraocular movements intact, No Nystagmus - Left. Eyeball - Right-Extraocular movements intact, No Nystagmus - Right. Cornea - Left-No Hazy - Left. Cornea - Right-No Hazy - Right. Sclera/Conjunctiva - Left-No scleral icterus, No Discharge - Left. Sclera/Conjunctiva - Right-No scleral icterus, No Discharge - Right. Pupil - Left-Direct reaction  to light normal. Pupil - Right-Direct reaction to light normal.  ENMT Ears Pinna - Left - no drainage observed, no generalized tenderness observed. Pinna - Right - no drainage observed, no generalized tenderness observed. Nose and Sinuses External Inspection of the Nose - no destructive lesion observed. Inspection of the nares - Left - quiet respiration. Inspection of the nares - Right - quiet respiration. Mouth and Throat Lips - Upper Lip - no fissures observed, no pallor noted. Lower Lip - no fissures observed, no pallor noted. Nasopharynx - no discharge present. Oral Cavity/Oropharynx - Tongue - no dryness observed. Oral Mucosa - no cyanosis observed. Hypopharynx - no evidence of airway distress observed.  Chest and Lung Exam Inspection Movements - Normal and Symmetrical. Accessory muscles - No use of accessory muscles in breathing. Palpation Palpation of the chest reveals - Non-tender. Auscultation Breath sounds - Normal and Clear.  Cardiovascular Auscultation Rhythm - Regular. Murmurs & Other Heart Sounds - Auscultation of the heart reveals - No Murmurs and No Systolic Clicks.  Abdomen Inspection Inspection of the abdomen reveals - No Visible peristalsis and No Abnormal pulsations. Umbilicus - No Bleeding, No Urine drainage. Palpation/Percussion Palpation and Percussion of the abdomen reveal - Soft, Non Tender, No Rebound tenderness, No Rigidity (guarding) and No Cutaneous hyperesthesia. Note: Abdomen morbidly obese but soft. Suprapubic discomfort but no peritonitis.. Not severely distended. No umbilical or other anterior abdominal wall hernias  Female Genitourinary Sexual Maturity Tanner 5 - Adult hair pattern. Note: No vaginal bleeding nor discharge  Peripheral Vascular Upper Extremity Inspection - Left - No Cyanotic nailbeds - Left, Not Ischemic. Inspection - Right - No Cyanotic nailbeds - Right, Not Ischemic.  Neurologic Neurologic evaluation reveals -normal  attention span and ability to concentrate, able to name objects and repeat phrases. Appropriate fund of knowledge , normal sensation and normal coordination. Mental Status Affect - not angry, not paranoid. Cranial Nerves-Normal Bilaterally. Gait-Normal.  Neuropsychiatric Mental status exam performed with findings of-able to articulate well with normal speech/language, rate, volume and coherence, thought content normal with ability to  perform basic computations and apply abstract reasoning and no evidence of hallucinations, delusions, obsessions or homicidal/suicidal ideation.  Musculoskeletal Global Assessment Spine, Ribs and Pelvis - no instability, subluxation or laxity. Right Upper Extremity - no instability, subluxation or laxity. Note: 1-2 plus bilateral lower extremity edema within the skin. Right upper extremity with 2+ edema. More swollen and right upper extremity than left. PICC line in right inner arm with clean dressing. No purulence or cellulitis.  Lymphatic Head & Neck  General Head & Neck Lymphatics: Bilateral - Description - No Localized lymphadenopathy. Axillary  General Axillary Region: Bilateral - Description - No Localized lymphadenopathy. Femoral & Inguinal  Generalized Femoral & Inguinal Lymphatics: Left - Description - No Localized lymphadenopathy. Right - Description - No Localized lymphadenopathy.    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 No obvious tumor on flex sigmoidoscopy. Less inflammation on follow-up CAT scan a few weeks ago.  Standard of care would be robotic segmental resection rectosigmoid resection repair of colovesical fistula.  Hopefully can do a one-step surgery with immediate colorectal anastomosis.  The challenges her malnutrition and chronic immunosuppression for psoriatic arthritis. She's been hoping a lot of  medications with still on prednisone. Would be nice to get below 10 mg and down to 5 possible to minimize risk of leak. Otherwise low threshold to do Hartmann resection or temporary diverting loop ileostomy to minimize risk issues.  Her surgery risks are obviously increased. I would like cardiac clearance. She is overdue to see cardiology. She tried to imply her [name] care physician commanded all that. I flat out stated I need cardiology in town to see her in double check things. Patient had a heart attack in March. She is not taking her Plavix. I don't know if that is appropriate or safe.  She is currently placed on parenteral nutrition with her last hospitalization. I do not know who is managing this. Records implied as to [name] care physician. Daughter notes patient is stronger on that. May need to stay on that until surgery can happen next month. We will see.   MALNUTRITION OF MODERATE DEGREE (E44.0) Impression: Fair oral intake but nutrition clinically seems to improve on the parenteral nutrition.  Would be wise to get nutritional labs. Patient notes she is getting those weekly. See if we can get copy of those lab results. If her albumin is above 3 and prealbumin in the more normal range, hopefully perioperative risks and wound/anastomotic line/hernia risk would be minimized   PICC (PERIPHERALLY INSERTED CENTRAL CATHETER) IN PLACE (Z45.2)   ON TOTAL PARENTERAL NUTRITION (Z78.9)   IMMUNOSUPPRESSION DUE TO DRUG THERAPY (Y86.578) Impression: Immunosuppression for psoriatic arthritis. Steroids have a highest risk of anastomotic leak and other issues, so ideally she should be off that and on something else. She is down to 10 mg. If she get down to 5, hopefully risk of anastomotic leak or hernia problems would be less. Once she can get past the surgery, consider more aggressive immunosuppression as needed >6 weeks postop   PSORIATIC ARTHRITIS (L40.50)   HISTORY OF STROKE  (I69.62) Impression: History of stroke. Her Plavix has been held as of the last month. Don't know who held that. I strongly recommend the patient reach out to her PCP and [facility] neurology to make sure that that is okay versus resuming.   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 Evaluation seems like it is consistent with my card disease not amenable to stent or bypass. Needs cardiac clearance before considering surgery. She is overdue to see them since last visit in April 2021  Current Plans I recommended obtaining preoperative cardiac clearance. I am concerned about the health of the patient and the ability to tolerate the operation. Therefore, we will request clearance by cardiology to better assess operative risk & see if a reevaluation, further workup, etc is needed. Also recommendations on how medications such as for anticoagulation and blood pressure should be managed/held/restarted after surgery. Pt Education - [facility] Hold anticoagulation preoperatively  SWELLING OF RIGHT UPPER EXTREMITY (M79.89) Impression: Swelling of right upper extremity at site of PICC line. Do not know if there is a clot or an issue there. See if we can order a duplex. See if she can restart her Plavix.  Current Plans Follow Up - Call [facility] office after tests / studies doneto discuss further plans  PREOP COLON - ENCOUNTER FOR PREOPERATIVE EXAMINATION FOR GENERAL SURGICAL PROCEDURE (Z01.818)  Current Plans You are being scheduled for surgery- Our schedulers will call you.  You should hear from our office's scheduling department within 5 working days about the location, date, and time of surgery. We try to make accommodations for patient's preferences in scheduling surgery, but sometimes the OR schedule or the surgeon's schedule prevents [place] from making those accommodations.  If you have not heard from our office [phone]) in 5 working  days, call the office and ask for your surgeon's nurse.  If you have other questions about your diagnosis, plan, or surgery, call the office and ask for your surgeon's nurse.  Written instructions provided The anatomy & physiology of the digestive tract was discussed. The pathophysiology of the colon was discussed. Natural history risks without surgery was discussed. I feel the risks of no intervention will lead to serious problems that outweigh the operative risks; therefore, I recommended a partial colectomy to remove the pathology. Minimally invasive (Robotic/Laparoscopic) & open techniques were discussed.  Risks such as bleeding, infection, abscess, leak, reoperation, possible ostomy, hernia, heart attack, death, and other risks were discussed. I noted a good likelihood this will help address the problem. Goals of post-operative recovery were discussed as well. Need for adequate nutrition, daily bowel regimen and healthy physical activity, to optimize recovery was noted as well. We will work to minimize complications. Educational materials were available as well. Questions were answered. The patient expresses understanding & wishes to proceed with surgery.  Pt Education - [facility] Colon Bowel Prep 2018 ERAS/Miralax/Antibiotics Started Neomycin Sulfate 500 MG Oral Tablet, 2 (two) Tablet SEE NOTE, #6, 06/17/2020, No Refill. Local Order: Pharmacist Notes: TAKE TWO TABLETS AT 2 PM, 3 PM, AND 10 PM THE DAY PRIOR TO SURGERY Started Flagyl 500 MG Oral Tablet, 2 (two) Tablet SEE NOTE, #6, 06/17/2020, No Refill. Local Order: Pharmacist Notes: Take at 2pm, 3pm, and 10pm the day prior to your colon operation Pt Education - Pamphlet Given - Laparoscopic Colorectal Surgery: discussed with patient and provided information. Pt Education - [facility] Colectomy post-op instructions: discussed with patient and provided information.  OBESITY, MORBID, BMI 40.0-49.9 (E66.01)  Valerie Hector, MD, FACS,  [facility] Gastrointestinal and Minimally Invasive Surgery  [facility] [address] [place], [place] [address] [phone] Fax [phone] Main/Paging  CONTACT INFORMATION: Weekday (9AM-5PM) concerns: Call [facility] main office at [phone] Weeknight (5PM-9AM) or Weekend/Holiday concerns: Check [email or web address] for General Surgery [facility] coverage (Please,  do not use SecureChat as it is not reliable communication to operating surgeons for immediate patient care)

## 2020-06-17 NOTE — H&P (Signed)
Valerie [name] Appointment: 06/17/2020 1:30 PM Location: [facility] Patient #: [id number] DOB: September 30, 1960 Single / Language: [name] / Race: White Female  History of Present Illness [name] MD; 06/17/2020 5:42 PM) The patient is a 59 year old female who presents with colovesical fistula. Note for "Colovesical fistula": ` ` ` Patient sent for surgical consultation at the request of Dr Otelia Santee  Chief Complaint: Colovesical fistula with multiple medical problems ` ` The patient is a obese woman with numerous health issues. Chronically immunosuppressed on steroids and Biologics for psoriatic arthritis - intermittently on prednisone, methotrexate, Enbrel, plaque [place], bipolar disorder. Had a stroke and myocardial infarction in March. Catheterization data but no stenting done. Felt to be not amenable to more aggressive intervention. Medical therapy. Chronic kidney disease. Diabetes. Had been on anticoagulation. Primary care physician in [place] - Dr Dimas Millin. She is to live down there by His Primary. She has had care at numerous health centers in the region. Rheumatology management and immunosuppression per her primary care physician.   Patient had a history of fall. Narcotics. Pain medications. Constipation. Recurrent urinary tract infections. Had worsening nausea or vomiting and hematuria. Admitted in September. Colovesical fistula suspected. Consultations main. Flexible sigmoidoscopy noting thickening and stricture but no malignancy. Improved with antibiotics. Recommendation for outpatient follow-up and try to hold immunosuppressive. Then she bounced back in the hospital a few weeks ago with failure to thrive. Placed on IV parenteral nutrition through a PICC line. Antibiotics. Steadily improved. Recommended to go to rehabilitation. She comes in to the office less than a week from her discharge. She notes she normally would have a bowel  movement at least once a day. Sometimes up to 4 times a day. Intermittent constipation. He comes in a wheelchair. Her daughter is with her trying to help with overall healthcare. She claims she can walk maybe a block before she has to stop. Her appetite has been poor. She has been getting stronger on the IV nutrition though. Patient and daughter note that her legs are swelling and she has some swelling in her right upper extremity as well. She is off all immunosuppression right now and is just on prednisone only. She is down to 10 mg a day. He had been initially consulted, and recommended holding prednisone and considering just using Enbrel or methotrexate to control her psoriatic arthritis. Looks like she is somewhat insulin-requiring. She notes she is not taking her Plavix since her last discharge for some reason. She does not know why.   (Review of systems as stated in this history (HPI) or in the review of systems. Otherwise all other 12 point ROS are negative) ` ` ###########################################`  This patient encounter took 80 minutes today to perform the following: obtain history, perform exam, review outside records, interpret tests & imaging, counsel the patient on their diagnosis; and, document this encounter, including findings & plan in the electronic health record (EHR).   Allergies (Chanel Teressa Senter, CMA; 06/17/2020 1:42 PM) TEGretol *ANTICONVULSANTS* Sertraline HCl *ANTIDEPRESSANTS* carBAMazepine *ANTICONVULSANTS* Allergies Reconciled  Medication History (Chanel Teressa Senter, CMA; 06/17/2020 1:43 PM) ALPRAZolam (1MG  Tablet, Oral) Active. Cefdinir (300MG  Capsule, Oral) Active. Donepezil HCl (10MG  Tablet, Oral) Active. Escitalopram Oxalate (20MG  Tablet, Oral) Active. HYDROcodone-Acetaminophen (10-325MG  Tablet, Oral) Active. lamoTRIgine (150MG  Tablet, Oral) Active. metroNIDAZOLE (500MG  Tablet, Oral) Active. Nadolol (20MG  Tablet, Oral)  Active. Nitroglycerin (0.4MG  Tab Sublingual, Sublingual) Active. Ondansetron HCl (8MG  Tablet, Oral) Active. predniSONE (10MG  Tablet, Oral) Active. Rizatriptan Benzoate (10MG  Tablet, Oral) Active. Rosuvastatin Calcium (5MG  Tablet,  Oral) Active. traZODone HCl (100MG  Tablet, Oral) Active. Medications Reconciled    Vitals (Chanel Nolan CMA; 06/17/2020 1:44 PM) 06/17/2020 1:43 PM Weight: 219.25 lb Height: 62in Body Surface Area: 1.99 m Body Mass Index: 40.1 kg/m  Temp.: 97.63F  Pulse: 74 (Regular)         Physical Exam [name] MD; 06/17/2020 5:44 PM)  General Mental Status-Alert. General Appearance-Not in acute distress, Not Sickly. Orientation-Oriented X3. Hydration-Well hydrated. Voice-Normal. Note: Sitting wheelchair. Calm. Not toxic. Not sickly. Can slowly climb up to the examination table with one person assist for balance  Integumentary Global Assessment Upon inspection and palpation of skin surfaces of the - Axillae: non-tender, no inflammation or ulceration, no drainage. and Distribution of scalp and body hair is normal. General Characteristics Temperature - normal warmth is noted.  Head and Neck Head-normocephalic, atraumatic with no lesions or palpable masses. Face Global Assessment - atraumatic, no absence of expression. Neck Global Assessment - no abnormal movements, no bruit auscultated on the right, no bruit auscultated on the left, no decreased range of motion, non-tender. Trachea-midline. Thyroid Gland Characteristics - non-tender.  Eye Eyeball - Left-Extraocular movements intact, No Nystagmus - Left. Eyeball - Right-Extraocular movements intact, No Nystagmus - Right. Cornea - Left-No Hazy - Left. Cornea - Right-No Hazy - Right. Sclera/Conjunctiva - Left-No scleral icterus, No Discharge - Left. Sclera/Conjunctiva - Right-No scleral icterus, No Discharge - Right. Pupil - Left-Direct reaction  to light normal. Pupil - Right-Direct reaction to light normal.  ENMT Ears Pinna - Left - no drainage observed, no generalized tenderness observed. Pinna - Right - no drainage observed, no generalized tenderness observed. Nose and Sinuses External Inspection of the Nose - no destructive lesion observed. Inspection of the nares - Left - quiet respiration. Inspection of the nares - Right - quiet respiration. Mouth and Throat Lips - Upper Lip - no fissures observed, no pallor noted. Lower Lip - no fissures observed, no pallor noted. Nasopharynx - no discharge present. Oral Cavity/Oropharynx - Tongue - no dryness observed. Oral Mucosa - no cyanosis observed. Hypopharynx - no evidence of airway distress observed.  Chest and Lung Exam Inspection Movements - Normal and Symmetrical. Accessory muscles - No use of accessory muscles in breathing. Palpation Palpation of the chest reveals - Non-tender. Auscultation Breath sounds - Normal and Clear.  Cardiovascular Auscultation Rhythm - Regular. Murmurs & Other Heart Sounds - Auscultation of the heart reveals - No Murmurs and No Systolic Clicks.  Abdomen Inspection Inspection of the abdomen reveals - No Visible peristalsis and No Abnormal pulsations. Umbilicus - No Bleeding, No Urine drainage. Palpation/Percussion Palpation and Percussion of the abdomen reveal - Soft, Non Tender, No Rebound tenderness, No Rigidity (guarding) and No Cutaneous hyperesthesia. Note: Abdomen morbidly obese but soft. Suprapubic discomfort but no peritonitis.. Not severely distended. No umbilical or other anterior abdominal wall hernias  Female Genitourinary Sexual Maturity Tanner 5 - Adult hair pattern. Note: No vaginal bleeding nor discharge  Peripheral Vascular Upper Extremity Inspection - Left - No Cyanotic nailbeds - Left, Not Ischemic. Inspection - Right - No Cyanotic nailbeds - Right, Not Ischemic.  Neurologic Neurologic evaluation reveals -normal  attention span and ability to concentrate, able to name objects and repeat phrases. Appropriate fund of knowledge , normal sensation and normal coordination. Mental Status Affect - not angry, not paranoid. Cranial Nerves-Normal Bilaterally. Gait-Normal.  Neuropsychiatric Mental status exam performed with findings of-able to articulate well with normal speech/language, rate, volume and coherence, thought content normal with ability to  perform basic computations and apply abstract reasoning and no evidence of hallucinations, delusions, obsessions or homicidal/suicidal ideation.  Musculoskeletal Global Assessment Spine, Ribs and Pelvis - no instability, subluxation or laxity. Right Upper Extremity - no instability, subluxation or laxity. Note: 1-2 plus bilateral lower extremity edema within the skin. Right upper extremity with 2+ edema. More swollen and right upper extremity than left. PICC line in right inner arm with clean dressing. No purulence or cellulitis.  Lymphatic Head & Neck  General Head & Neck Lymphatics: Bilateral - Description - No Localized lymphadenopathy. Axillary  General Axillary Region: Bilateral - Description - No Localized lymphadenopathy. Femoral & Inguinal  Generalized Femoral & Inguinal Lymphatics: Left - Description - No Localized lymphadenopathy. Right - Description - No Localized lymphadenopathy.    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 No obvious tumor on flex sigmoidoscopy. Less inflammation on follow-up CAT scan a few weeks ago.  Standard of care would be robotic segmental resection rectosigmoid resection repair of colovesical fistula.  Hopefully can do a one-step surgery with immediate colorectal anastomosis.  The challenges her malnutrition and chronic immunosuppression for psoriatic arthritis. She's been hoping a lot of  medications with still on prednisone. Would be nice to get below 10 mg and down to 5 possible to minimize risk of leak. Otherwise low threshold to do Hartmann resection or temporary diverting loop ileostomy to minimize risk issues.  Her surgery risks are obviously increased. I would like cardiac clearance. She is overdue to see cardiology. She tried to imply her primary care physician commanded all that. I flat out stated I need cardiology in town to see her in double check things. Patient had a heart attack in March. She is not taking her Plavix. I don't know if that is appropriate or safe.  She is currently placed on parenteral nutrition with her last hospitalization. I do not know who is managing this. Records implied as to primary care physician. Daughter notes patient is stronger on that. May need to stay on that until surgery can happen next month. We will see.   MALNUTRITION OF MODERATE DEGREE (E44.0) Impression: Fair oral intake but nutrition clinically seems to improve on the parenteral nutrition.  Would be wise to get nutritional labs. Patient notes she is getting those weekly. See if we can get copy of those lab results. If her albumin is above 3 and prealbumin in the more normal range, hopefully perioperative risks and wound/anastomotic line/hernia risk would be minimized   PICC (PERIPHERALLY INSERTED CENTRAL CATHETER) IN PLACE (Z45.2)   ON TOTAL PARENTERAL NUTRITION (Z78.9)   IMMUNOSUPPRESSION DUE TO DRUG THERAPY (P50.932) Impression: Immunosuppression for psoriatic arthritis. Steroids have a highest risk of anastomotic leak and other issues, so ideally she should be off that and on something else. She is down to 10 mg. If she get down to 5, hopefully risk of anastomotic leak or hernia problems would be less. Once she can get past the surgery, consider more aggressive immunosuppression as needed >6 weeks postop   PSORIATIC ARTHRITIS (L40.50)   HISTORY OF STROKE  (I71.24) Impression: History of stroke. Her Plavix has been held as of the last month. Don't know who held that. I strongly recommend the patient reach out to her PCP and [facility] neurology to make sure that that is okay versus resuming.   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 Evaluation seems like it is consistent with my card disease not amenable to stent or bypass. Needs cardiac clearance before considering surgery. She is overdue to see them since last visit in April 2021  Current Plans I recommended obtaining preoperative cardiac clearance. I am concerned about the health of the patient and the ability to tolerate the operation. Therefore, we will request clearance by cardiology to better assess operative risk & see if a reevaluation, further workup, etc is needed. Also recommendations on how medications such as for anticoagulation and blood pressure should be managed/held/restarted after surgery. Pt Education - [facility] Hold anticoagulation preoperatively  SWELLING OF RIGHT UPPER EXTREMITY (M79.89) Impression: Swelling of right upper extremity at site of PICC line. Do not know if there is a clot or an issue there. See if we can order a duplex. See if she can restart her Plavix.  Current Plans Follow Up - Call [facility] office after tests / studies doneto discuss further plans  PREOP COLON - ENCOUNTER FOR PREOPERATIVE EXAMINATION FOR GENERAL SURGICAL PROCEDURE (Z01.818)  Current Plans You are being scheduled for surgery- Our schedulers will call you.  You should hear from our office's scheduling department within 5 working days about the location, date, and time of surgery. We try to make accommodations for patient's preferences in scheduling surgery, but sometimes the OR schedule or the surgeon's schedule prevents [place] from making those accommodations.  If you have not heard from our office [phone]) in 5 working  days, call the office and ask for your surgeon's nurse.  If you have other questions about your diagnosis, plan, or surgery, call the office and ask for your surgeon's nurse.  Written instructions provided The anatomy & physiology of the digestive tract was discussed. The pathophysiology of the colon was discussed. Natural history risks without surgery was discussed. I feel the risks of no intervention will lead to serious problems that outweigh the operative risks; therefore, I recommended a partial colectomy to remove the pathology. Minimally invasive (Robotic/Laparoscopic) & open techniques were discussed.  Risks such as bleeding, infection, abscess, leak, reoperation, possible ostomy, hernia, heart attack, death, and other risks were discussed. I noted a good likelihood this will help address the problem. Goals of post-operative recovery were discussed as well. Need for adequate nutrition, daily bowel regimen and healthy physical activity, to optimize recovery was noted as well. We will work to minimize complications. Educational materials were available as well. Questions were answered. The patient expresses understanding & wishes to proceed with surgery.  Pt Education - [facility] Colon Bowel Prep 2018 ERAS/Miralax/Antibiotics Started Neomycin Sulfate 500 MG Oral Tablet, 2 (two) Tablet SEE NOTE, #6, 06/17/2020, No Refill. Local Order: Pharmacist Notes: TAKE TWO TABLETS AT 2 PM, 3 PM, AND 10 PM THE DAY PRIOR TO SURGERY Started Flagyl 500 MG Oral Tablet, 2 (two) Tablet SEE NOTE, #6, 06/17/2020, No Refill. Local Order: Pharmacist Notes: Take at 2pm, 3pm, and 10pm the day prior to your colon operation Pt Education - Pamphlet Given - Laparoscopic Colorectal Surgery: discussed with patient and provided information. Pt Education - [facility] Colectomy post-op instructions: discussed with patient and provided information.  OBESITY, MORBID, BMI 40.0-49.9 (E66.01)  [name], MD, FACS,  [facility] Gastrointestinal and Minimally Invasive Surgery  [facility] [address], [place], [place] [address] [phone] Fax [phone] Main/Paging  CONTACT INFORMATION: Weekday (9AM-5PM) concerns: Call [facility] main office at [phone] Weeknight (5PM-9AM) or Weekend/Holiday concerns: Check [email or web address] for General Surgery [facility] coverage (Please,  do not use SecureChat as it is not reliable communication to operating surgeons for immediate patient care)

## 2020-06-18 ENCOUNTER — Other Ambulatory Visit: Payer: Self-pay

## 2020-06-18 ENCOUNTER — Ambulatory Visit (HOSPITAL_COMMUNITY)
Admission: RE | Admit: 2020-06-18 | Discharge: 2020-06-18 | Disposition: A | Discharge status: Home / Self Care | Payer: Medicare Other | Source: Ambulatory Visit | Attending: Surgery | Admitting: Surgery

## 2020-06-18 ENCOUNTER — Telehealth: Payer: Self-pay

## 2020-06-18 DIAGNOSIS — R609 Edema, unspecified: Secondary | ICD-10-CM | POA: Diagnosis present

## 2020-06-18 NOTE — Telephone Encounter (Signed)
   [facility] Pre-operative Risk Assessment    HEARTCARE STAFF  Request for surgical clearance:  1. What type of surgery is being performed? Colon surgery  2. When is this surgery scheduled? TBD   3. What type of clearance is required (medical clearance vs. Pharmacy clearance to hold med vs. Both)? both  4. Are there any medications that need to be held prior to surgery and how long? Patient reports plavix is on hold, please confirm   5. Practice name and name of physician performing surgery? [facility] / Dr. Johney Maine   6. What is the office phone number? [phone]   7.   What is the office fax number? [phone]  8.   Anesthesia type (None, local, MAC, general) ? general   Sherrie Mustache 06/18/2020, 10:44 AM  _________________________________________________________________   (provider comments below)

## 2020-06-18 NOTE — Telephone Encounter (Signed)
   Primary Cardiologist: Valerie Munroe, MD  Chart reviewed as part of pre-operative protocol coverage. Patient history of NSTEMI secondary to [facility] in 10/2019. Cardiac cath showed mild non-obstructive CAD. Echo showed LVEF of Echo at that time showed LVEF of 60-65% with normal wall motion and diastolic function. She was last seen by Dr. [name] in 02/2020 for a virtual visit at which time she was doing well from a cardiac standpoint. Patient contacted today for further pre-op evaluation. She reports doing well from a cardiac standpoint since last office visit. She notes some lower extremity edema recently but no chest pain, shortness of breath, orthopnea, PND, palpitations, syncope. Advised limiting sodium intake, compression stockings, and elevating legs for edema. Patient has had some significant weakness lately and is not very activity so unsure if she can truly complete 4.0 METS. However, given only mild CAD on recent cath, patient OK to proceed with procedure. Discussed with Dr. [name] and patient probably considered intermediate risk for procedure but OK to proceed without any additional cardiovascular testing.    Patient's Plavix was recently discontinued at most recent discharged. Unclear exactly why as it was not specifically mentioned in Discharge Summary. Possibly due to acute anemia with hemoglobin trending down to 8.4.  Discussed this with Dr. [name] as well. Patient would ideally be on dual antiplatelet therapy for 12 months after NSTEMI; however, given no intervention was performed, OK to hold Plavix for now in light of unclear source of anemia and upcoming surgery. However, patient has history of CVA so would also recommend patient touch base with Neurology or PCP about need for Plavix.  The patient was advised that if she develops new symptoms or worsening lower extremity edema prior to surgery to contact our office and she verbalized understanding.  I will route this recommendation  to the requesting party via Epic fax function and remove from pre-op pool.  Please call with questions.  Darreld Mclean, PA-C 06/18/2020, 1:36 PM

## 2020-06-18 NOTE — Progress Notes (Signed)
Lower extremity venous has been completed.   Preliminary results in CV Proc.   Abram Sander 06/18/2020 9:11 AM

## 2020-07-03 ENCOUNTER — Other Ambulatory Visit: Payer: Self-pay | Admitting: Urology

## 2020-07-05 ENCOUNTER — Ambulatory Visit: Payer: Self-pay | Admitting: Surgery

## 2020-07-05 NOTE — Progress Notes (Addendum)
COVID Vaccine Completed:  x1 Date COVID Vaccine completed:  November 2021 COVID vaccine manufacturer: [place]   PCP - Otelia Santee, MD Cardiologist - Cherlynn Kaiser, MD  Clearance in Epic dated 06-18-20 by Sande Rives, PA-C  Chest x-ray - 05-14-20 in Epic EKG - 06-04-20 in Epic Stress Test -  ECHO - 11-14-19 in Epic Cardiac Cath - 11-14-19 in Epic Pacemaker/ICD device last checked:  Sleep Study - 10+ years ago,  +sleep apnea CPAP - Yes  Fasting Blood Sugar -  Checks Blood Sugar - does not check   Blood Thinner Instructions:  Plavix 75.  Stop five days preop per patient Aspirin Instructions:  ASA 81 Last Dose:  Anesthesia review:  CAD, hx of MI, OSA, DM, CKD Stage III  Patient denies shortness of breath, fever, cough and chest pain at PAT appointment   Patient verbalized understanding of instructions that were given to them at the PAT appointment. Patient was also instructed that they will need to review over the PAT instructions again at home before surgery.

## 2020-07-05 NOTE — Patient Instructions (Addendum)
DUE TO COVID-19 ONLY ONE VISITOR IS ALLOWED TO COME WITH YOU AND STAY IN THE WAITING ROOM ONLY DURING PRE OP AND PROCEDURE.   IF YOU WILL BE ADMITTED INTO THE HOSPITAL YOU ARE ALLOWED ONE SUPPORT PERSON DURING VISITATION HOURS ONLY (10AM -8PM)   . The support person may change daily. . The support person must pass our screening, gel in and out, and wear a mask at all times, including in the patient's room. . Patients must also wear a mask when staff or their support person are in the room.   COVID SWAB TESTING MUST BE COMPLETED ON:  Saturday, 07-13-20 @  10:45 AM   [address] [place], [place] [address]  (Must self quarantine after testing. Follow instructions on handout.)    Your procedure is scheduled on:  Wednesday, 07-17-20   Report to [facility] Main  Entrance   Report to Short Stay at 5:45 AM   [facility])   Call this number if you have problems the morning of surgery [phone]   Do not eat food :After Midnight.   May have liquids until 4:45 AM  day of surgery  CLEAR LIQUID DIET  Foods Allowed                                                                     Foods Excluded  Water, Black Coffee and tea, regular and decaf             liquids that you cannot  Plain Jell-O in any flavor  (No red)                                    see through such as: Fruit ices (not with fruit pulp)                                      milk, soups, orange juice              Iced Popsicles (No red)                                      All solid food                                   Apple juices Sports drinks like Gatorade (No red) Lightly seasoned clear broth or consume(fat free) Sugar, honey syrup     Drink 2 G2 drinks the night before surgery by 10:00 PM.  Complete one Ensure drink the morning of surgery 3 hours prior to  scheduled surgery at 4:45 AM.   Oral Hygiene is also important to reduce your risk of infection.                                    Remember -  BRUSH YOUR TEETH THE MORNING OF SURGERY WITH YOUR REGULAR  TOOTHPASTE   Do NOT smoke after Midnight   Take these medicines the morning of surgery with A SIP OF WATER:  Aricept, Lexapro, Lamictal, Omeprazole, Nadolol, Rosuvastatin                               You may not have any metal on your body including hair pins, jewelry, and body piercings             Do not wear make-up, lotions, powders, perfumes/cologne, or deodorant             Do not wear nail polish.  Do not shave  48 hours prior to surgery.    Bring CPAP mask and tubing day of surgery           Do not bring valuables to the hospital. [place].   Contacts, dentures or bridgework may not be worn into surgery.   Bring small overnight bag day of surgery.                 Please read over the following fact sheets you were given: IF YOU HAVE QUESTIONS ABOUT YOUR PRE OP INSTRUCTIONS PLEASE CALL [phone]   [place] - Preparing for Surgery Before surgery, you can play an important role.  Because skin is not sterile, your skin needs to be as free of germs as possible.  You can reduce the number of germs on your skin by washing with CHG (chlorahexidine gluconate) soap before surgery.  CHG is an antiseptic cleaner which kills germs and bonds with the skin to continue killing germs even after washing. Please DO NOT use if you have an allergy to CHG or antibacterial soaps.  If your skin becomes reddened/irritated stop using the CHG and inform your nurse when you arrive at Short Stay. Do not shave (including legs and underarms) for at least 48 hours prior to the first CHG shower.  You may shave your face/neck.  Please follow these instructions carefully:  1.  Shower with CHG Soap the night before surgery and the  morning of surgery.  2.  If you choose to wash your hair, wash your hair first as usual with your normal  shampoo.  3.  After you shampoo, rinse your hair and body thoroughly to  remove the shampoo.                             4.  Use CHG as you would any other liquid soap.  You can apply chg directly to the skin and wash.  Gently with a scrungie or clean washcloth.  5.  Apply the CHG Soap to your body ONLY FROM THE NECK DOWN.   Do   not use on face/ open                           Wound or open sores. Avoid contact with eyes, ears mouth and   genitals (private parts).                       Wash face,  Genitals (private parts) with your normal soap.             6.  Wash thoroughly, paying special attention to the area where your    surgery  will be  performed.  7.  Thoroughly rinse your body with warm water from the neck down.  8.  DO NOT shower/wash with your normal soap after using and rinsing off the CHG Soap.                9.  Pat yourself dry with a clean towel.            10.  Wear clean pajamas.            11.  Place clean sheets on your bed the night of your first shower and do not  sleep with pets. Day of Surgery : Do not apply any lotions/deodorants the morning of surgery.  Please wear clean clothes to the hospital/surgery center.  FAILURE TO FOLLOW THESE INSTRUCTIONS MAY RESULT IN THE CANCELLATION OF YOUR SURGERY  PATIENT SIGNATURE_________________________________  NURSE SIGNATURE__________________________________  ________________________________________________________________________   Valerie [name]  An incentive spirometer is a tool that can help keep your lungs clear and active. This tool measures how well you are filling your lungs with each breath. Taking long deep breaths may help reverse or decrease the chance of developing breathing (pulmonary) problems (especially infection) following:  A long period of time when you are unable to move or be active. BEFORE THE PROCEDURE   If the spirometer includes an indicator to show your best effort, your nurse or respiratory therapist will set it to a desired goal.  If possible, sit up straight  or lean slightly forward. Try not to slouch.  Hold the incentive spirometer in an upright position. INSTRUCTIONS FOR USE  1. Sit on the edge of your bed if possible, or sit up as far as you can in bed or on a chair. 2. Hold the incentive spirometer in an upright position. 3. Breathe out normally. 4. Place the mouthpiece in your mouth and seal your lips tightly around it. 5. Breathe in slowly and as deeply as possible, raising the piston or the ball toward the top of the column. 6. Hold your breath for 3-5 seconds or for as long as possible. Allow the piston or ball to fall to the bottom of the column. 7. Remove the mouthpiece from your mouth and breathe out normally. 8. Rest for a few seconds and repeat Steps 1 through 7 at least 10 times every 1-2 hours when you are awake. Take your time and take a few normal breaths between deep breaths. 9. The spirometer may include an indicator to show your best effort. Use the indicator as a goal to work toward during each repetition. 10. After each set of 10 deep breaths, practice coughing to be sure your lungs are clear. If you have an incision (the cut made at the time of surgery), support your incision when coughing by placing a pillow or rolled up towels firmly against it. Once you are able to get out of bed, walk around indoors and cough well. You may stop using the incentive spirometer when instructed by your caregiver.  RISKS AND COMPLICATIONS  Take your time so you do not get dizzy or light-headed.  If you are in pain, you may need to take or ask for pain medication before doing incentive spirometry. It is harder to take a deep breath if you are having pain. AFTER USE  Rest and breathe slowly and easily.  It can be helpful to keep track of a log of your progress. Your caregiver can provide you with a simple table to help with this. If you  are using the spirometer at home, follow these instructions: [place] IF:   You are having  difficultly using the spirometer.  You have trouble using the spirometer as often as instructed.  Your pain medication is not giving enough relief while using the spirometer.  You develop fever of 100.5 F (38.1 C) or higher. SEEK IMMEDIATE MEDICAL CARE IF:   You cough up bloody sputum that had not been present before.  You develop fever of 102 F (38.9 C) or greater.  You develop worsening pain at or near the incision site. MAKE SURE YOU:   Understand these instructions.  Will watch your condition.  Will get help right away if you are not doing well or get worse. Document Released: 12/21/2006 Document Revised: 11/02/2011 Document Reviewed: 02/21/2007 ExitCare Patient Information 2014 Exit[employer], [place].   ________________________________________________________________________  WHAT IS A BLOOD TRANSFUSION? Blood Transfusion Information  A transfusion is the replacement of blood or some of its parts. Blood is made up of multiple cells which provide different functions.  Red blood cells carry oxygen and are used for blood loss replacement.  White blood cells fight against infection.  Platelets control bleeding.  Plasma helps clot blood.  Other blood products are available for specialized needs, such as hemophilia or other clotting disorders. BEFORE THE TRANSFUSION  Who gives blood for transfusions?   Healthy volunteers who are fully evaluated to make sure their blood is safe. This is blood bank blood. Transfusion therapy is the safest it has ever been in the practice of medicine. Before blood is taken from a donor, a complete history is taken to make sure that person has no history of diseases nor engages in risky social behavior (examples are intravenous drug use or sexual activity with multiple partners). The donor's travel history is screened to minimize risk of transmitting infections, such as malaria. The donated blood is tested for signs of infectious diseases, such as  HIV and hepatitis. The blood is then tested to be sure it is compatible with you in order to minimize the chance of a transfusion reaction. If you or a relative donates blood, this is often done in anticipation of surgery and is not appropriate for emergency situations. It takes many days to process the donated blood. RISKS AND COMPLICATIONS Although transfusion therapy is very safe and saves many lives, the main dangers of transfusion include:   Getting an infectious disease.  Developing a transfusion reaction. This is an allergic reaction to something in the blood you were given. Every precaution is taken to prevent this. The decision to have a blood transfusion has been considered carefully by your caregiver before blood is given. Blood is not given unless the benefits outweigh the risks. AFTER THE TRANSFUSION  Right after receiving a blood transfusion, you will usually feel much better and more energetic. This is especially true if your red blood cells have gotten low (anemic). The transfusion raises the level of the red blood cells which carry oxygen, and this usually causes an energy increase.  The nurse administering the transfusion will monitor you carefully for complications. HOME CARE INSTRUCTIONS  No special instructions are needed after a transfusion. You may find your energy is better. Speak with your caregiver about any limitations on activity for underlying diseases you may have. SEEK MEDICAL CARE IF:   Your condition is not improving after your transfusion.  You develop redness or irritation at the intravenous (IV) site. SEEK IMMEDIATE MEDICAL CARE IF:  Any of the following  symptoms occur over the next 12 hours:  Shaking chills.  You have a temperature by mouth above 102 F (38.9 C), not controlled by medicine.  Chest, back, or muscle pain.  People around you feel you are not acting correctly or are confused.  Shortness of breath or difficulty breathing.  Dizziness  and fainting.  You get a rash or develop hives.  You have a decrease in urine output.  Your urine turns a dark color or changes to pink, red, or brown. Any of the following symptoms occur over the next 10 days:  You have a temperature by mouth above 102 F (38.9 C), not controlled by medicine.  Shortness of breath.  Weakness after normal activity.  The white part of the eye turns yellow (jaundice).  You have a decrease in the amount of urine or are urinating less often.  Your urine turns a dark color or changes to pink, red, or brown. Document Released: 08/07/2000 Document Revised: 11/02/2011 Document Reviewed: 03/26/2008 [facility] Patient Information 2014 [place], [place].  _______________________________________________________________________

## 2020-07-08 ENCOUNTER — Other Ambulatory Visit: Payer: Self-pay

## 2020-07-08 ENCOUNTER — Encounter (HOSPITAL_COMMUNITY)
Admission: RE | Admit: 2020-07-08 | Discharge: 2020-07-08 | Disposition: A | Discharge status: Home / Self Care | Payer: Medicare Other | Source: Ambulatory Visit | Attending: Surgery | Admitting: Surgery

## 2020-07-08 ENCOUNTER — Encounter (HOSPITAL_COMMUNITY): Payer: Self-pay

## 2020-07-08 DIAGNOSIS — Z79899 Other long term (current) drug therapy: Secondary | ICD-10-CM | POA: Diagnosis not present

## 2020-07-08 DIAGNOSIS — R531 Weakness: Secondary | ICD-10-CM | POA: Insufficient documentation

## 2020-07-08 DIAGNOSIS — Z01812 Encounter for preprocedural laboratory examination: Secondary | ICD-10-CM | POA: Diagnosis present

## 2020-07-08 DIAGNOSIS — N321 Vesicointestinal fistula: Secondary | ICD-10-CM | POA: Diagnosis not present

## 2020-07-08 DIAGNOSIS — N183 Chronic kidney disease, stage 3 unspecified: Secondary | ICD-10-CM | POA: Insufficient documentation

## 2020-07-08 DIAGNOSIS — F17210 Nicotine dependence, cigarettes, uncomplicated: Secondary | ICD-10-CM | POA: Insufficient documentation

## 2020-07-08 DIAGNOSIS — I2510 Atherosclerotic heart disease of native coronary artery without angina pectoris: Secondary | ICD-10-CM | POA: Diagnosis not present

## 2020-07-08 DIAGNOSIS — E118 Type 2 diabetes mellitus with unspecified complications: Secondary | ICD-10-CM | POA: Insufficient documentation

## 2020-07-08 DIAGNOSIS — Z8673 Personal history of transient ischemic attack (TIA), and cerebral infarction without residual deficits: Secondary | ICD-10-CM | POA: Diagnosis not present

## 2020-07-08 HISTORY — DX: Vesicointestinal fistula: N32.1

## 2020-07-08 HISTORY — DX: Headache, unspecified: R51.9

## 2020-07-08 HISTORY — DX: Dyspnea, unspecified: R06.00

## 2020-07-08 HISTORY — DX: Anemia, unspecified: D64.9

## 2020-07-08 HISTORY — DX: Cerebral infarction, unspecified: I63.9

## 2020-07-08 LAB — HEMOGLOBIN A1C
Hgb A1c MFr Bld: 5.2 % (ref 4.8–5.6)
Mean Plasma Glucose: 102.54 mg/dL

## 2020-07-08 LAB — COMPREHENSIVE METABOLIC PANEL
ALT: 14 U/L (ref 0–44)
AST: 15 U/L (ref 15–41)
Albumin: 3.2 g/dL — ABNORMAL LOW (ref 3.5–5.0)
Alkaline Phosphatase: 89 U/L (ref 38–126)
Anion gap: 7 (ref 5–15)
BUN: 42 mg/dL — ABNORMAL HIGH (ref 6–20)
CO2: 26 mmol/L (ref 22–32)
Calcium: 10.1 mg/dL (ref 8.9–10.3)
Chloride: 106 mmol/L (ref 98–111)
Creatinine, Ser: 1.53 mg/dL — ABNORMAL HIGH (ref 0.44–1.00)
GFR, Estimated: 39 mL/min — ABNORMAL LOW (ref >60)
Glucose, Bld: 133 mg/dL — ABNORMAL HIGH (ref 70–99)
Potassium: 4.8 mmol/L (ref 3.5–5.1)
Sodium: 139 mmol/L (ref 135–145)
Total Bilirubin: 0.4 mg/dL (ref 0.3–1.2)
Total Protein: 6.4 g/dL — ABNORMAL LOW (ref 6.5–8.1)

## 2020-07-08 LAB — CBC WITH DIFFERENTIAL/PLATELET
Abs Immature Granulocytes: 0.15 10*3/uL — ABNORMAL HIGH (ref 0.00–0.07)
Basophils Absolute: 0.1 10*3/uL (ref 0.0–0.1)
Basophils Relative: 1 %
Eosinophils Absolute: 0.3 10*3/uL (ref 0.0–0.5)
Eosinophils Relative: 2 %
HCT: 33.7 % — ABNORMAL LOW (ref 36.0–46.0)
Hemoglobin: 10.1 g/dL — ABNORMAL LOW (ref 12.0–15.0)
Immature Granulocytes: 1 %
Lymphocytes Relative: 5 %
Lymphs Abs: 0.6 10*3/uL — ABNORMAL LOW (ref 0.7–4.0)
MCH: 32.8 pg (ref 26.0–34.0)
MCHC: 30 g/dL (ref 30.0–36.0)
MCV: 109.4 fL — ABNORMAL HIGH (ref 80.0–100.0)
Monocytes Absolute: 0.8 10*3/uL (ref 0.1–1.0)
Monocytes Relative: 7 %
Neutro Abs: 10.4 10*3/uL — ABNORMAL HIGH (ref 1.7–7.7)
Neutrophils Relative %: 84 %
Platelets: 304 10*3/uL (ref 150–400)
RBC: 3.08 MIL/uL — ABNORMAL LOW (ref 3.87–5.11)
RDW: 14.4 % (ref 11.5–15.5)
WBC: 12.3 10*3/uL — ABNORMAL HIGH (ref 4.0–10.5)
nRBC: 0 % (ref 0.0–0.2)

## 2020-07-08 LAB — GLUCOSE, CAPILLARY: Glucose-Capillary: 131 mg/dL — ABNORMAL HIGH (ref 70–99)

## 2020-07-08 NOTE — Progress Notes (Signed)
CBC with Diff sent to Dr. Johney [place] to review.

## 2020-07-09 NOTE — Consult Note (Signed)
[facility] Nurse requested for preoperative stoma site marking LATE ENTRY  Patient was seen at Pre surgical testing appointment 07/08/20  Discussed surgical procedure and stoma creation with patient and family.  Explained role of the [facility] nurse team.  Provided the patient with educational booklet and provided samples of pouching options.  Answered patient and family questions.   Examined patient  sitting, and standing in order to place the marking in the patient's visual field, away from any creases or abdominal contour issues and within the rectus muscle.  Patient unable to visualize stoma is placed below umbilicus due to pendulous abdomen.  Marked above umbilicus within field of vision.   Marked for colostomy in the LLQ  4 cm to the left of the umbilicus and 4 cm above the umbilicus.  Marked for ileostomy in the RLQ  4 cm to the right of the umbilicus and  4 cm above the umbilicus.    Patient's abdomen cleansed with CHG wipes at site markings, allowed to air dry prior to marking.Covered mark with thin film transparent dressing to preserve mark until date of surgery.   [facility] Nurse team will follow up with patient after surgery for continue ostomy care and teaching.   Domenic Moras MSN, RN, FNP-BC [facility]ON Wound, Ostomy, Continence Nurse Pager [phone]

## 2020-07-11 ENCOUNTER — Other Ambulatory Visit (HOSPITAL_COMMUNITY): Payer: Medicare Other

## 2020-07-12 NOTE — Progress Notes (Signed)
Anesthesia Chart Review   Case: [id number] Date/Time: 07/17/20 0715   Procedures:      ROBOTIC RESECTION OF COLON, RECTOSIGMOID, POSSIBLE BLADDER REPAIR, (N/A )     POSSIBLE OSTOMY (N/A )     RIGID PROCTOSCOPY (N/A )     CYSTOSCOPY WITH FIREFLY STENT PLACEMENT (N/A )   Anesthesia type: General   Pre-op diagnosis: colovesical fistula   Location: [facility] ROOM 02 / [facility]   Surgeons: Michael Boston, MD; Ceasar Mons, MD      DISCUSSION:59 y.o. current every day smoker with h/o OSA, DM II, CKD Stage III, stroke, colovesical fistula scheduled for above procedure 07/17/2020 with Dr. Michael Boston and Dr. Ellison Hughs.   Per cardiology preoperative risk assessment 06/18/2020, "Chart reviewed as part of pre-operative protocol coverage. Patient history of NSTEMI secondary to [facility] in 10/2019. Cardiac cath showed mild non-obstructive CAD. Echo showed LVEF of Echo at that time showed LVEF of 60-65% with normal wall motion and diastolic function. She was last seen by Dr. [name] in 02/2020 for a virtual visit at which time she was doing well from a cardiac standpoint. Patient contacted today for further pre-op evaluation. She reports doing well from a cardiac standpoint since last office visit. She notes some lower extremity edema recently but no chest pain, shortness of breath, orthopnea, PND, palpitations, syncope. Advised limiting sodium intake, compression stockings, and elevating legs for edema. Patient has had some significant weakness lately and is not very activity so unsure if she can truly complete 4.0 METS. However, given only mild CAD on recent cath, patient OK to proceed with procedure. Discussed with Dr. [name] and patient probably considered intermediate risk for procedure but OK to proceed without any additional cardiovascular testing. Patient's Plavix was recently discontinued at most recent discharged. Unclear exactly why as it was not specifically mentioned in Discharge Summary.  Possibly due to acute anemia with hemoglobin trending down to 8.4.  Discussed this with Dr. [name] as well. Patient would ideally be on dual antiplatelet therapy for 12 months after NSTEMI; however, given no intervention was performed, OK to hold Plavix for now in light of unclear source of anemia and upcoming surgery. However, patient has history of CVA so would also recommend patient touch base with Neurology or PCP about need for Plavix." Pt reports she has been advised to hold Plavix 5 days prior to surgery.   Anticipate pt can proceed with planned procedure barring acute status change.   VS: BP (!) 129/37   Temp 36.7 C (Oral)   Resp 16   Ht 5\' 2"  (1.575 m)   Wt 100.7 kg   SpO2 98%   BMI 40.60 kg/m   PROVIDERS: Ophelia Shoulder, MD is PCP   Cherlynn Kaiser, MD is Cardiologist  LABS: Labs reviewed: Acceptable for surgery. (all labs ordered are listed, but only abnormal results are displayed)  Labs Reviewed  CBC WITH DIFFERENTIAL/PLATELET - Abnormal; Notable for the following components:      Result Value   WBC 12.3 (*)    RBC 3.08 (*)    Hemoglobin 10.1 (*)    HCT 33.7 (*)    MCV 109.4 (*)    Neutro Abs 10.4 (*)    Lymphs Abs 0.6 (*)    Abs Immature Granulocytes 0.15 (*)    All other components within normal limits  COMPREHENSIVE METABOLIC PANEL - Abnormal; Notable for the following components:   Glucose, Bld 133 (*)    BUN 42 (*)  Creatinine, Ser 1.53 (*)    Total Protein 6.4 (*)    Albumin 3.2 (*)    GFR, Estimated 39 (*)    All other components within normal limits  GLUCOSE, CAPILLARY - Abnormal; Notable for the following components:   Glucose-Capillary 131 (*)    All other components within normal limits  HEMOGLOBIN A1C  TYPE AND SCREEN     IMAGES:   EKG: 06/04/20 Rate 60 bpm  Sinus rhythm with 1st degree AV block  Possible anterolateral infarct, age undetermined   CV: Cardiac Cath 11/14/2019  Prox LAD to Mid LAD lesion is 30% stenosed.  Prox RCA  lesion is 25% stenosed.  LV end diastolic pressure is moderately elevated.   1. Mild nonobstructive CAD. 2. Moderately elevated LV EDP  Plan: medical management. Assess LV function by Echo. Potential etiology for chest pain include hypertensive heart disease, stress induced CM, or aortic pathology.   Echo 11/14/2019 IMPRESSIONS    1. Left ventricular ejection fraction, by estimation, is 60 to 65%. The  left ventricle has normal function. The left ventricle has no regional  wall motion abnormalities. Left ventricular diastolic parameters were  normal.  2. Right ventricular systolic function is normal. The right ventricular  size is normal. Tricuspid regurgitation signal is inadequate for assessing  PA pressure.  3. The mitral valve is grossly normal. No evidence of mitral valve  regurgitation. No evidence of mitral stenosis.  4. The aortic valve is tricuspid. Aortic valve regurgitation is not  visualized. No aortic stenosis is present.  5. The inferior vena cava is normal in size with greater than 50%  respiratory variability, suggesting right atrial pressure of 3 mmHg. Past Medical History:  Diagnosis Date  . Anemia   . Anxiety   . Arthritis    Psoriatic  . Bipolar disorder ([place])   . CKD (chronic kidney disease), stage III ([place])   . Colovesical fistula   . Depression   . DM type 2 (diabetes mellitus, type 2) ([place]) 11/14/2019  . Dyspnea   . Headache   . Neuropathy   . NSTEMI (non-ST elevated myocardial infarction) ([place]) 11/14/2019  . OSA (obstructive sleep apnea)   . Psoriatic arthritis ([place])   . Sleep apnea   . Stroke [facility])    Memory issues, March 2021    Past Surgical History:  Procedure Laterality Date  . APPENDECTOMY    . CHOLECYSTECTOMY    . FLEXIBLE SIGMOIDOSCOPY N/A 05/17/2020   Procedure: FLEXIBLE SIGMOIDOSCOPY;  Surgeon: Carol Ada, MD;  Location: [facility];  Service: Endoscopy;  Laterality: N/A;  . FOOT SURGERY Right   . LEFT HEART CATH AND  CORONARY ANGIOGRAPHY N/A 11/14/2019   Procedure: LEFT HEART CATH AND CORONARY ANGIOGRAPHY;  Surgeon: Martinique, Peter M, MD;  Location: [facility] CV LAB;  Service: Cardiovascular;  Laterality: N/A;  . TONSILLECTOMY    . TOTAL HIP ARTHROPLASTY Bilateral     MEDICATIONS: . ALPRAZolam (XANAX) 1 MG tablet  . Ascorbic Acid (VITAMIN C) 1000 MG tablet  . aspirin EC 81 MG tablet  . b complex vitamins tablet  . Cholecalciferol (VITAMIN D-3) 1000 units CAPS  . ciprofloxacin (CIPRO) 500 MG tablet  . clopidogrel (PLAVIX) 75 MG tablet  . docusate sodium (COLACE) 100 MG capsule  . donepezil (ARICEPT) 10 MG tablet  . escitalopram (LEXAPRO) 20 MG tablet  . furosemide (LASIX) 20 MG tablet  . HYDROcodone-acetaminophen (NORCO) 10-325 MG tablet  . lamoTRIgine (LAMICTAL) 150 MG tablet  . nadolol (CORGARD) 20 MG  tablet  . nitroGLYCERIN (NITROSTAT) 0.4 MG SL tablet  . omeprazole (PRILOSEC) 20 MG capsule  . ondansetron (ZOFRAN) 8 MG tablet  . predniSONE (DELTASONE) 10 MG tablet  . rizatriptan (MAXALT-MLT) 10 MG disintegrating tablet  . rosuvastatin (CRESTOR) 5 MG tablet  . traZODone (DESYREL) 100 MG tablet   No current facility-administered medications for this encounter.    [name], PA-C [facility] Pre-Surgical Testing [phone]

## 2020-07-12 NOTE — Anesthesia Preprocedure Evaluation (Addendum)
Anesthesia Evaluation  Patient identified by MRN, date of birth, ID band Patient awake    Reviewed: Allergy & Precautions, H&P , NPO status , Patient's Chart, lab work & pertinent test results  Airway Mallampati: III  TM Distance: >3 FB Neck ROM: Full    Dental no notable dental hx. (+) Teeth Intact, Dental Advisory Given, Poor Dentition, Chipped,    Pulmonary neg pulmonary ROS, sleep apnea and Continuous Positive Airway Pressure Ventilation , Current Smoker,    Pulmonary exam normal breath sounds clear to auscultation       Cardiovascular Exercise Tolerance: Good + CAD and + Past MI  negative cardio ROS Normal cardiovascular exam Rhythm:Regular Rate:Normal     Neuro/Psych  Headaches, PSYCHIATRIC DISORDERS Anxiety Depression Bipolar Disorder CVA, No Residual Symptoms negative psych ROS   GI/Hepatic negative GI ROS, Neg liver ROS,   Endo/Other  negative endocrine ROSdiabetes, Type 2Morbid obesity  Renal/GU Renal diseasenegative Renal ROS  negative genitourinary   Musculoskeletal negative musculoskeletal ROS (+) Arthritis , Osteoarthritis,    Abdominal   Peds negative pediatric ROS (+)  Hematology negative hematology ROS (+) Blood dyscrasia, anemia ,   Anesthesia Other Findings   Reproductive/Obstetrics negative OB ROS                         Anesthesia Physical Anesthesia Plan  ASA: III  Anesthesia Plan: General   Post-op Pain Management:    Induction: Intravenous  PONV Risk Score and Plan: 3 and Ondansetron, Treatment may vary due to age or medical condition and Dexamethasone  Airway Management Planned: Oral ETT  Additional Equipment: None  Intra-op Plan:   Post-operative Plan: Extubation in OR  Informed Consent: I have reviewed the patients History and Physical, chart, labs and discussed the procedure including the risks, benefits and alternatives for the proposed anesthesia  with the patient or authorized representative who has indicated his/her understanding and acceptance.       Plan Discussed with: CRNA and Anesthesiologist  Anesthesia Plan Comments: (See PAT note 07/08/2020, Valerie Felix, PA-C 58 y.o. current every day smoker with h/o OSA, DM II, CKD Stage III, stroke, colovesical fistula scheduled for above procedure 07/17/2020 with Dr. Michael Boston and Dr. Ellison Hughs.   Per cardiology preoperative risk assessment 06/18/2020, "Chart reviewed as part of pre-operative protocol coverage.Patient history of NSTEMI secondary to [name] in 10/2019. Cardiac cath showed mild non-obstructive CAD. Echo showed LVEF of Echo at that time showed LVEF of 60-65% with normal wall motion and diastolic function. She was last seen by Dr. [name] in 02/2020 for a virtual visit at which time she was doing well from a cardiac standpoint. Patient contacted today for further pre-op evaluation. She reports doing well from a cardiac standpoint since last office visit. She notes some lower extremity edema recently but no chest pain, shortness of breath, orthopnea, PND, palpitations, syncope. Advised limiting sodium intake, compression stockings, and elevating legs for edema. Patient has had some significant weakness lately and is not very activity so unsure if she can truly complete 4.0 METS. However, given only mild CAD on recent cath, patient OK to proceed with procedure. Discussed with Dr. [name] and patient probably considered intermediate risk for procedure but OK to proceed without any additional cardiovascular testing. Patient's Plavix was recently discontinued at most recent discharged. Unclear exactly why as it was not specifically mentioned in Discharge Summary. Possibly due to acute anemia with hemoglobin trending down to 8.4. Discussed this with Dr. [name]  as well. Patient would ideally be on dual antiplatelet therapy for 12 months after NSTEMI; however, given no  intervention was performed, OK to hold Plavix for now in light of unclear source of anemia and upcoming surgery. However, patient has history of CVA so would also recommend patient touch base with Neurology or PCP about need for Plavix." Pt reports she has been advised to hold Plavix 5 days prior to surgery.   EKG: 06/04/20 Rate 60 bpm  Sinus rhythm with 1st degree AV block  Possible anterolateral infarct, age undetermined   Cardiac Cath 11/14/2019  Prox LAD to Mid LAD lesion is 30% stenosed.  Prox RCA lesion is 25% stenosed.  LV end diastolic pressure is moderately elevated.  1. Mild nonobstructive CAD. 2. Moderately elevated LV EDP  Echo 11/14/2019 1. Left ventricular ejection fraction, by estimation, is 60 to 65%. The  left ventricle has normal function. The left ventricle has no regional  wall motion abnormalities. Left ventricular diastolic parameters were  normal.  )       Anesthesia Quick Evaluation

## 2020-07-13 ENCOUNTER — Other Ambulatory Visit (HOSPITAL_COMMUNITY)
Admission: RE | Admit: 2020-07-13 | Discharge: 2020-07-13 | Disposition: A | Discharge status: Home / Self Care | Payer: Medicare Other | Source: Ambulatory Visit | Attending: Surgery | Admitting: Surgery

## 2020-07-13 DIAGNOSIS — Z01812 Encounter for preprocedural laboratory examination: Secondary | ICD-10-CM | POA: Insufficient documentation

## 2020-07-13 DIAGNOSIS — Z20822 Contact with and (suspected) exposure to covid-19: Secondary | ICD-10-CM | POA: Diagnosis not present

## 2020-07-13 LAB — SARS CORONAVIRUS 2 (TAT 6-24 HRS): SARS Coronavirus 2: NEGATIVE

## 2020-07-16 MED ORDER — BUPIVACAINE LIPOSOME 1.3 % IJ SUSP
20.0000 mL | Freq: Once | INTRAMUSCULAR | Status: DC
  Filled 2020-07-16: qty 20

## 2020-07-17 ENCOUNTER — Inpatient Hospital Stay (HOSPITAL_COMMUNITY): Payer: Medicare Other | Admitting: Anesthesiology

## 2020-07-17 ENCOUNTER — Inpatient Hospital Stay (HOSPITAL_COMMUNITY): Payer: Medicare Other | Admitting: Physician Assistant

## 2020-07-17 ENCOUNTER — Other Ambulatory Visit: Payer: Self-pay

## 2020-07-17 ENCOUNTER — Inpatient Hospital Stay (HOSPITAL_COMMUNITY)
Admission: RE | Admit: 2020-07-17 | Discharge: 2020-07-23 | DRG: 329 | Disposition: A | Discharge status: Skilled Nursing Facility | Payer: Medicare Other | Attending: Internal Medicine | Admitting: Internal Medicine

## 2020-07-17 ENCOUNTER — Encounter (HOSPITAL_COMMUNITY): Payer: Self-pay | Admitting: Surgery

## 2020-07-17 ENCOUNTER — Encounter (HOSPITAL_COMMUNITY)
Admission: RE | Disposition: A | Discharge status: Skilled Nursing Facility | Payer: Self-pay | Source: Home / Self Care | Attending: Internal Medicine

## 2020-07-17 DIAGNOSIS — Z20822 Contact with and (suspected) exposure to covid-19: Secondary | ICD-10-CM | POA: Diagnosis present

## 2020-07-17 DIAGNOSIS — E8770 Fluid overload, unspecified: Secondary | ICD-10-CM | POA: Diagnosis not present

## 2020-07-17 DIAGNOSIS — K660 Peritoneal adhesions (postprocedural) (postinfection): Secondary | ICD-10-CM | POA: Diagnosis present

## 2020-07-17 DIAGNOSIS — F419 Anxiety disorder, unspecified: Secondary | ICD-10-CM | POA: Diagnosis not present

## 2020-07-17 DIAGNOSIS — F319 Bipolar disorder, unspecified: Secondary | ICD-10-CM | POA: Diagnosis present

## 2020-07-17 DIAGNOSIS — G9341 Metabolic encephalopathy: Secondary | ICD-10-CM | POA: Diagnosis not present

## 2020-07-17 DIAGNOSIS — N183 Chronic kidney disease, stage 3 unspecified: Secondary | ICD-10-CM | POA: Diagnosis present

## 2020-07-17 DIAGNOSIS — J9601 Acute respiratory failure with hypoxia: Secondary | ICD-10-CM

## 2020-07-17 DIAGNOSIS — E1122 Type 2 diabetes mellitus with diabetic chronic kidney disease: Secondary | ICD-10-CM | POA: Diagnosis present

## 2020-07-17 DIAGNOSIS — J9811 Atelectasis: Secondary | ICD-10-CM | POA: Diagnosis not present

## 2020-07-17 DIAGNOSIS — L4050 Arthropathic psoriasis, unspecified: Secondary | ICD-10-CM | POA: Diagnosis not present

## 2020-07-17 DIAGNOSIS — D631 Anemia in chronic kidney disease: Secondary | ICD-10-CM | POA: Diagnosis present

## 2020-07-17 DIAGNOSIS — E1140 Type 2 diabetes mellitus with diabetic neuropathy, unspecified: Secondary | ICD-10-CM | POA: Diagnosis not present

## 2020-07-17 DIAGNOSIS — N1832 Chronic kidney disease, stage 3b: Secondary | ICD-10-CM | POA: Diagnosis present

## 2020-07-17 DIAGNOSIS — I2510 Atherosclerotic heart disease of native coronary artery without angina pectoris: Secondary | ICD-10-CM | POA: Diagnosis present

## 2020-07-17 DIAGNOSIS — I129 Hypertensive chronic kidney disease with stage 1 through stage 4 chronic kidney disease, or unspecified chronic kidney disease: Secondary | ICD-10-CM | POA: Diagnosis present

## 2020-07-17 DIAGNOSIS — E1142 Type 2 diabetes mellitus with diabetic polyneuropathy: Secondary | ICD-10-CM | POA: Diagnosis present

## 2020-07-17 DIAGNOSIS — R4781 Slurred speech: Secondary | ICD-10-CM | POA: Diagnosis not present

## 2020-07-17 DIAGNOSIS — R0902 Hypoxemia: Secondary | ICD-10-CM

## 2020-07-17 DIAGNOSIS — K219 Gastro-esophageal reflux disease without esophagitis: Secondary | ICD-10-CM | POA: Diagnosis present

## 2020-07-17 DIAGNOSIS — D638 Anemia in other chronic diseases classified elsewhere: Secondary | ICD-10-CM

## 2020-07-17 DIAGNOSIS — E7800 Pure hypercholesterolemia, unspecified: Secondary | ICD-10-CM | POA: Diagnosis present

## 2020-07-17 DIAGNOSIS — E876 Hypokalemia: Secondary | ICD-10-CM | POA: Diagnosis not present

## 2020-07-17 DIAGNOSIS — N321 Vesicointestinal fistula: Secondary | ICD-10-CM | POA: Diagnosis present

## 2020-07-17 DIAGNOSIS — E440 Moderate protein-calorie malnutrition: Secondary | ICD-10-CM

## 2020-07-17 DIAGNOSIS — Z888 Allergy status to other drugs, medicaments and biological substances status: Secondary | ICD-10-CM

## 2020-07-17 DIAGNOSIS — R270 Ataxia, unspecified: Secondary | ICD-10-CM | POA: Diagnosis not present

## 2020-07-17 DIAGNOSIS — R7981 Abnormal blood-gas level: Secondary | ICD-10-CM

## 2020-07-17 DIAGNOSIS — G4733 Obstructive sleep apnea (adult) (pediatric): Secondary | ICD-10-CM | POA: Diagnosis present

## 2020-07-17 DIAGNOSIS — E66812 Obesity, class 2: Secondary | ICD-10-CM | POA: Diagnosis present

## 2020-07-17 DIAGNOSIS — Z7952 Long term (current) use of systemic steroids: Secondary | ICD-10-CM

## 2020-07-17 DIAGNOSIS — K5720 Diverticulitis of large intestine with perforation and abscess without bleeding: Principal | ICD-10-CM | POA: Diagnosis present

## 2020-07-17 DIAGNOSIS — R400 Somnolence: Secondary | ICD-10-CM | POA: Diagnosis not present

## 2020-07-17 DIAGNOSIS — E119 Type 2 diabetes mellitus without complications: Secondary | ICD-10-CM

## 2020-07-17 DIAGNOSIS — T502X5A Adverse effect of carbonic-anhydrase inhibitors, benzothiadiazides and other diuretics, initial encounter: Secondary | ICD-10-CM | POA: Diagnosis not present

## 2020-07-17 DIAGNOSIS — Z6841 Body Mass Index (BMI) 40.0 and over, adult: Secondary | ICD-10-CM | POA: Diagnosis not present

## 2020-07-17 DIAGNOSIS — I252 Old myocardial infarction: Secondary | ICD-10-CM

## 2020-07-17 DIAGNOSIS — Z79899 Other long term (current) drug therapy: Secondary | ICD-10-CM

## 2020-07-17 DIAGNOSIS — F17210 Nicotine dependence, cigarettes, uncomplicated: Secondary | ICD-10-CM | POA: Diagnosis present

## 2020-07-17 DIAGNOSIS — R21 Rash and other nonspecific skin eruption: Secondary | ICD-10-CM | POA: Diagnosis not present

## 2020-07-17 DIAGNOSIS — Z452 Encounter for adjustment and management of vascular access device: Secondary | ICD-10-CM

## 2020-07-17 DIAGNOSIS — N182 Chronic kidney disease, stage 2 (mild): Secondary | ICD-10-CM

## 2020-07-17 DIAGNOSIS — Z8673 Personal history of transient ischemic attack (TIA), and cerebral infarction without residual deficits: Secondary | ICD-10-CM

## 2020-07-17 DIAGNOSIS — R4182 Altered mental status, unspecified: Secondary | ICD-10-CM

## 2020-07-17 HISTORY — PX: CYSTOSCOPY WITH STENT PLACEMENT: SHX5790

## 2020-07-17 HISTORY — PX: PROCTOSCOPY: SHX2266

## 2020-07-17 LAB — TYPE AND SCREEN
ABO/RH(D): AB POS
Antibody Screen: NEGATIVE

## 2020-07-17 LAB — CBC WITH DIFFERENTIAL/PLATELET
Abs Immature Granulocytes: 0.02 10*3/uL (ref 0.00–0.07)
Basophils Absolute: 0.1 10*3/uL (ref 0.0–0.1)
Basophils Relative: 1 %
Eosinophils Absolute: 0.3 10*3/uL (ref 0.0–0.5)
Eosinophils Relative: 4 %
HCT: 31.7 % — ABNORMAL LOW (ref 36.0–46.0)
Hemoglobin: 9.7 g/dL — ABNORMAL LOW (ref 12.0–15.0)
Immature Granulocytes: 0 %
Lymphocytes Relative: 13 %
Lymphs Abs: 0.9 10*3/uL (ref 0.7–4.0)
MCH: 32 pg (ref 26.0–34.0)
MCHC: 30.6 g/dL (ref 30.0–36.0)
MCV: 104.6 fL — ABNORMAL HIGH (ref 80.0–100.0)
Monocytes Absolute: 0.8 10*3/uL (ref 0.1–1.0)
Monocytes Relative: 11 %
Neutro Abs: 5.3 10*3/uL (ref 1.7–7.7)
Neutrophils Relative %: 71 %
Platelets: 218 10*3/uL (ref 150–400)
RBC: 3.03 MIL/uL — ABNORMAL LOW (ref 3.87–5.11)
RDW: 13.8 % (ref 11.5–15.5)
WBC: 7.4 10*3/uL (ref 4.0–10.5)
nRBC: 0 % (ref 0.0–0.2)

## 2020-07-17 LAB — COMPREHENSIVE METABOLIC PANEL
ALT: 12 U/L (ref 0–44)
AST: 18 U/L (ref 15–41)
Albumin: 3.1 g/dL — ABNORMAL LOW (ref 3.5–5.0)
Alkaline Phosphatase: 73 U/L (ref 38–126)
Anion gap: 7 (ref 5–15)
BUN: 20 mg/dL (ref 6–20)
CO2: 28 mmol/L (ref 22–32)
Calcium: 11.3 mg/dL — ABNORMAL HIGH (ref 8.9–10.3)
Chloride: 102 mmol/L (ref 98–111)
Creatinine, Ser: 1.64 mg/dL — ABNORMAL HIGH (ref 0.44–1.00)
GFR, Estimated: 36 mL/min — ABNORMAL LOW (ref >60)
Glucose, Bld: 109 mg/dL — ABNORMAL HIGH (ref 70–99)
Potassium: 3.9 mmol/L (ref 3.5–5.1)
Sodium: 137 mmol/L (ref 135–145)
Total Bilirubin: 0.4 mg/dL (ref 0.3–1.2)
Total Protein: 6 g/dL — ABNORMAL LOW (ref 6.5–8.1)

## 2020-07-17 LAB — ABO/RH: ABO/RH(D): AB POS

## 2020-07-17 LAB — GLUCOSE, CAPILLARY: Glucose-Capillary: 96 mg/dL (ref 70–99)

## 2020-07-17 SURGERY — COLECTOMY, SIGMOID, ROBOT-ASSISTED
Anesthesia: General | Site: Rectum

## 2020-07-17 MED ORDER — ASPIRIN EC 81 MG PO TBEC
81.0000 mg | DELAYED_RELEASE_TABLET | Freq: Every day | ORAL | Status: DC
  Administered 2020-07-18 – 2020-07-23 (×6): 81 mg via ORAL
  Filled 2020-07-17 (×6): qty 1

## 2020-07-17 MED ORDER — BUPIVACAINE-EPINEPHRINE (PF) 0.25% -1:200000 IJ SOLN
INTRAMUSCULAR | Status: AC
  Filled 2020-07-17: qty 60

## 2020-07-17 MED ORDER — METHOCARBAMOL 500 MG PO TABS: 1000.0000 mg | ORAL_TABLET | Freq: Four times a day (QID) | ORAL | Status: DC | PRN

## 2020-07-17 MED ORDER — ONDANSETRON HCL 4 MG PO TABS: 4.0000 mg | ORAL_TABLET | Freq: Four times a day (QID) | ORAL | Status: DC | PRN

## 2020-07-17 MED ORDER — ROCURONIUM BROMIDE 10 MG/ML (PF) SYRINGE
PREFILLED_SYRINGE | INTRAVENOUS | Status: AC
  Filled 2020-07-17: qty 10

## 2020-07-17 MED ORDER — METRONIDAZOLE 500 MG PO TABS: 1000.0000 mg | ORAL_TABLET | ORAL | Status: DC

## 2020-07-17 MED ORDER — ASCORBIC ACID 500 MG PO TABS
1000.0000 mg | ORAL_TABLET | Freq: Every day | ORAL | Status: DC
  Administered 2020-07-17 – 2020-07-18 (×2): 1000 mg via ORAL
  Filled 2020-07-17 (×2): qty 2

## 2020-07-17 MED ORDER — ROCURONIUM BROMIDE 10 MG/ML (PF) SYRINGE
PREFILLED_SYRINGE | INTRAVENOUS | Status: DC | PRN
  Administered 2020-07-17 (×3): 20 mg via INTRAVENOUS

## 2020-07-17 MED ORDER — STERILE WATER FOR INJECTION IJ SOLN
INTRAMUSCULAR | Status: AC
  Filled 2020-07-17: qty 10

## 2020-07-17 MED ORDER — PSYLLIUM 95 % PO PACK
1.0000 | PACK | Freq: Two times a day (BID) | ORAL | Status: DC
  Administered 2020-07-17 – 2020-07-23 (×10): 1 via ORAL
  Filled 2020-07-17 (×10): qty 1

## 2020-07-17 MED ORDER — LIDOCAINE 2% (20 MG/ML) 5 ML SYRINGE
INTRAMUSCULAR | Status: AC
  Filled 2020-07-17: qty 5

## 2020-07-17 MED ORDER — PHENYLEPHRINE 40 MCG/ML (10ML) SYRINGE FOR IV PUSH (FOR BLOOD PRESSURE SUPPORT)
PREFILLED_SYRINGE | INTRAVENOUS | Status: DC | PRN
  Administered 2020-07-17 (×2): 80 ug via INTRAVENOUS

## 2020-07-17 MED ORDER — BUPIVACAINE LIPOSOME 1.3 % IJ SUSP
INTRAMUSCULAR | Status: DC | PRN
  Administered 2020-07-17: 20 mL

## 2020-07-17 MED ORDER — MAGIC MOUTHWASH
15.0000 mL | Freq: Four times a day (QID) | ORAL | Status: DC | PRN
  Filled 2020-07-17: qty 15

## 2020-07-17 MED ORDER — PHENYLEPHRINE HCL (PRESSORS) 10 MG/ML IV SOLN
INTRAVENOUS | Status: AC
  Filled 2020-07-17: qty 1

## 2020-07-17 MED ORDER — POLYETHYLENE GLYCOL 3350 17 GM/SCOOP PO POWD: 1.0000 | Freq: Once | ORAL | Status: DC

## 2020-07-17 MED ORDER — LIDOCAINE 2% (20 MG/ML) 5 ML SYRINGE
INTRAMUSCULAR | Status: DC | PRN
  Administered 2020-07-17: 1 mg/kg/h via INTRAVENOUS

## 2020-07-17 MED ORDER — LACTATED RINGERS IV SOLN: INTRAVENOUS | Status: DC

## 2020-07-17 MED ORDER — ACETAMINOPHEN 325 MG PO TABS: 325.0000 mg | ORAL_TABLET | ORAL | Status: DC | PRN

## 2020-07-17 MED ORDER — MIDAZOLAM HCL 5 MG/5ML IJ SOLN
INTRAMUSCULAR | Status: DC | PRN
  Administered 2020-07-17: 2 mg via INTRAVENOUS

## 2020-07-17 MED ORDER — ACETAMINOPHEN 160 MG/5ML PO SOLN: 325.0000 mg | ORAL | Status: DC | PRN

## 2020-07-17 MED ORDER — ENOXAPARIN SODIUM 40 MG/0.4ML ~~LOC~~ SOLN
40.0000 mg | Freq: Once | SUBCUTANEOUS | Status: AC
  Administered 2020-07-17: 40 mg via SUBCUTANEOUS
  Filled 2020-07-17: qty 0.4

## 2020-07-17 MED ORDER — PHENYLEPHRINE HCL-NACL 10-0.9 MG/250ML-% IV SOLN
INTRAVENOUS | Status: DC | PRN
  Administered 2020-07-17: 50 ug/min via INTRAVENOUS

## 2020-07-17 MED ORDER — LACTATED RINGERS IV SOLN: INTRAVENOUS | Status: DC | PRN

## 2020-07-17 MED ORDER — CELECOXIB 200 MG PO CAPS
200.0000 mg | ORAL_CAPSULE | ORAL | Status: AC
  Administered 2020-07-17: 200 mg via ORAL
  Filled 2020-07-17: qty 1

## 2020-07-17 MED ORDER — NADOLOL 20 MG PO TABS
10.0000 mg | ORAL_TABLET | Freq: Every day | ORAL | Status: DC
  Administered 2020-07-17 – 2020-07-23 (×7): 10 mg via ORAL
  Filled 2020-07-17 (×7): qty 1

## 2020-07-17 MED ORDER — PREDNISONE 5 MG PO TABS: 5.0000 mg | ORAL_TABLET | ORAL | Status: DC

## 2020-07-17 MED ORDER — ACETAMINOPHEN 500 MG PO TABS
1000.0000 mg | ORAL_TABLET | ORAL | Status: AC
  Administered 2020-07-17: 1000 mg via ORAL
  Filled 2020-07-17: qty 2

## 2020-07-17 MED ORDER — PHENYLEPHRINE 40 MCG/ML (10ML) SYRINGE FOR IV PUSH (FOR BLOOD PRESSURE SUPPORT)
PREFILLED_SYRINGE | INTRAVENOUS | Status: AC
  Filled 2020-07-17: qty 10

## 2020-07-17 MED ORDER — FENTANYL CITRATE (PF) 250 MCG/5ML IJ SOLN
INTRAMUSCULAR | Status: DC | PRN
  Administered 2020-07-17 (×2): 50 ug via INTRAVENOUS

## 2020-07-17 MED ORDER — ACETAMINOPHEN 500 MG PO TABS
500.0000 mg | ORAL_TABLET | Freq: Four times a day (QID) | ORAL | Status: DC
  Administered 2020-07-17 – 2020-07-23 (×15): 500 mg via ORAL
  Filled 2020-07-17 (×15): qty 1

## 2020-07-17 MED ORDER — OXYCODONE HCL 5 MG/5ML PO SOLN: 5.0000 mg | Freq: Once | ORAL | Status: DC | PRN

## 2020-07-17 MED ORDER — STERILE WATER FOR INJECTION IJ SOLN
INTRAMUSCULAR | Status: DC | PRN
  Administered 2020-07-17: 15 mL via URETERAL

## 2020-07-17 MED ORDER — ALVIMOPAN 12 MG PO CAPS
12.0000 mg | ORAL_CAPSULE | ORAL | Status: AC
  Administered 2020-07-17: 12 mg via ORAL
  Filled 2020-07-17: qty 1

## 2020-07-17 MED ORDER — NITROGLYCERIN 0.4 MG SL SUBL: 0.4000 mg | SUBLINGUAL_TABLET | SUBLINGUAL | Status: DC | PRN

## 2020-07-17 MED ORDER — SODIUM CHLORIDE 0.9 % IR SOLN
Status: DC | PRN
  Administered 2020-07-17: 200 mL via INTRAVESICAL

## 2020-07-17 MED ORDER — PROCHLORPERAZINE MALEATE 10 MG PO TABS
10.0000 mg | ORAL_TABLET | Freq: Four times a day (QID) | ORAL | Status: DC | PRN
  Filled 2020-07-17: qty 1

## 2020-07-17 MED ORDER — ENSURE PRE-SURGERY PO LIQD
296.0000 mL | Freq: Once | ORAL | Status: DC
  Filled 2020-07-17: qty 296

## 2020-07-17 MED ORDER — LIP MEDEX EX OINT
1.0000 "application " | TOPICAL_OINTMENT | Freq: Two times a day (BID) | CUTANEOUS | Status: DC
  Administered 2020-07-17 – 2020-07-23 (×13): 1 via TOPICAL
  Filled 2020-07-17 (×4): qty 7

## 2020-07-17 MED ORDER — PROCHLORPERAZINE EDISYLATE 10 MG/2ML IJ SOLN: 5.0000 mg | Freq: Four times a day (QID) | INTRAMUSCULAR | Status: DC | PRN

## 2020-07-17 MED ORDER — ESCITALOPRAM OXALATE 20 MG PO TABS
30.0000 mg | ORAL_TABLET | Freq: Every day | ORAL | Status: DC
  Administered 2020-07-17 – 2020-07-23 (×7): 30 mg via ORAL
  Filled 2020-07-17 (×7): qty 1

## 2020-07-17 MED ORDER — ORAL CARE MOUTH RINSE
15.0000 mL | Freq: Once | OROMUCOSAL | Status: AC
  Administered 2020-07-17: 15 mL via OROMUCOSAL

## 2020-07-17 MED ORDER — LIDOCAINE 2% (20 MG/ML) 5 ML SYRINGE
INTRAMUSCULAR | Status: DC | PRN
  Administered 2020-07-17: 40 mg via INTRAVENOUS

## 2020-07-17 MED ORDER — ONDANSETRON HCL 4 MG/2ML IJ SOLN: 4.0000 mg | Freq: Once | INTRAMUSCULAR | Status: DC | PRN

## 2020-07-17 MED ORDER — TRAMADOL HCL 50 MG PO TABS: 50.0000 mg | ORAL_TABLET | Freq: Four times a day (QID) | ORAL | Status: DC | PRN

## 2020-07-17 MED ORDER — METHOCARBAMOL 1000 MG/10ML IJ SOLN
1000.0000 mg | Freq: Four times a day (QID) | INTRAVENOUS | Status: DC | PRN
  Filled 2020-07-17: qty 10

## 2020-07-17 MED ORDER — LAMOTRIGINE 25 MG PO TABS
150.0000 mg | ORAL_TABLET | Freq: Two times a day (BID) | ORAL | Status: DC
  Administered 2020-07-17 – 2020-07-23 (×13): 150 mg via ORAL
  Filled 2020-07-17 (×3): qty 2
  Filled 2020-07-17: qty 1
  Filled 2020-07-17: qty 2
  Filled 2020-07-17: qty 1
  Filled 2020-07-17 (×3): qty 2
  Filled 2020-07-17: qty 1
  Filled 2020-07-17: qty 2
  Filled 2020-07-17: qty 1
  Filled 2020-07-17: qty 2

## 2020-07-17 MED ORDER — PREDNISONE 5 MG PO TABS
10.0000 mg | ORAL_TABLET | ORAL | Status: DC
  Administered 2020-07-19: 10 mg via ORAL
  Filled 2020-07-17 (×2): qty 2

## 2020-07-17 MED ORDER — HYDROCODONE-ACETAMINOPHEN 10-325 MG PO TABS
1.0000 | ORAL_TABLET | Freq: Four times a day (QID) | ORAL | Status: DC | PRN
  Administered 2020-07-17 – 2020-07-19 (×2): 1 via ORAL
  Filled 2020-07-17 (×2): qty 1

## 2020-07-17 MED ORDER — HYDROMORPHONE HCL 1 MG/ML IJ SOLN
0.5000 mg | INTRAMUSCULAR | Status: DC | PRN
  Administered 2020-07-17: 1 mg via INTRAVENOUS
  Administered 2020-07-18: 0.5 mg via INTRAVENOUS
  Filled 2020-07-17 (×2): qty 1

## 2020-07-17 MED ORDER — PROPOFOL 10 MG/ML IV BOLUS
INTRAVENOUS | Status: AC
  Filled 2020-07-17: qty 20

## 2020-07-17 MED ORDER — ALPRAZOLAM 1 MG PO TABS
1.0000 mg | ORAL_TABLET | Freq: Three times a day (TID) | ORAL | Status: DC | PRN
  Administered 2020-07-18: 1 mg via ORAL
  Filled 2020-07-17: qty 1

## 2020-07-17 MED ORDER — MEPERIDINE HCL 50 MG/ML IJ SOLN: 6.2500 mg | INTRAMUSCULAR | Status: DC | PRN

## 2020-07-17 MED ORDER — ALUM & MAG HYDROXIDE-SIMETH 200-200-20 MG/5ML PO SUSP: 30.0000 mL | Freq: Four times a day (QID) | ORAL | Status: DC | PRN

## 2020-07-17 MED ORDER — GABAPENTIN 300 MG PO CAPS
300.0000 mg | ORAL_CAPSULE | ORAL | Status: AC
  Administered 2020-07-17: 300 mg via ORAL
  Filled 2020-07-17: qty 1

## 2020-07-17 MED ORDER — DEXAMETHASONE SODIUM PHOSPHATE 10 MG/ML IJ SOLN
INTRAMUSCULAR | Status: DC | PRN
  Administered 2020-07-17: 8 mg via INTRAVENOUS

## 2020-07-17 MED ORDER — ONDANSETRON HCL 4 MG/2ML IJ SOLN
INTRAMUSCULAR | Status: AC
  Filled 2020-07-17: qty 2

## 2020-07-17 MED ORDER — ONDANSETRON HCL 4 MG/2ML IJ SOLN
INTRAMUSCULAR | Status: DC | PRN
  Administered 2020-07-17: 4 mg via INTRAVENOUS

## 2020-07-17 MED ORDER — OXYCODONE HCL 5 MG PO TABS: 5.0000 mg | ORAL_TABLET | Freq: Once | ORAL | Status: DC | PRN

## 2020-07-17 MED ORDER — B COMPLEX-C PO TABS
1.0000 | ORAL_TABLET | Freq: Every day | ORAL | Status: DC
  Administered 2020-07-17 – 2020-07-23 (×7): 1 via ORAL
  Filled 2020-07-17 (×7): qty 1

## 2020-07-17 MED ORDER — GABAPENTIN 100 MG PO CAPS
200.0000 mg | ORAL_CAPSULE | Freq: Three times a day (TID) | ORAL | Status: DC
  Administered 2020-07-17 – 2020-07-18 (×5): 200 mg via ORAL
  Filled 2020-07-17 (×5): qty 2

## 2020-07-17 MED ORDER — 0.9 % SODIUM CHLORIDE (POUR BTL) OPTIME
TOPICAL | Status: DC | PRN
  Administered 2020-07-17: 2000 mL

## 2020-07-17 MED ORDER — DIPHENHYDRAMINE HCL 50 MG/ML IJ SOLN: 12.5000 mg | Freq: Four times a day (QID) | INTRAMUSCULAR | Status: DC | PRN

## 2020-07-17 MED ORDER — KETAMINE HCL 10 MG/ML IJ SOLN
INTRAMUSCULAR | Status: AC
  Filled 2020-07-17: qty 1

## 2020-07-17 MED ORDER — BISACODYL 5 MG PO TBEC: 20.0000 mg | DELAYED_RELEASE_TABLET | Freq: Once | ORAL | Status: DC

## 2020-07-17 MED ORDER — SODIUM CHLORIDE 0.9 % IV SOLN
2.0000 g | INTRAVENOUS | Status: AC
  Administered 2020-07-17: 2 g via INTRAVENOUS
  Filled 2020-07-17: qty 2

## 2020-07-17 MED ORDER — ENSURE PRE-SURGERY PO LIQD: 592.0000 mL | Freq: Once | ORAL | Status: DC

## 2020-07-17 MED ORDER — METHYLENE BLUE 0.5 % INJ SOLN
INTRAVENOUS | Status: DC | PRN
  Administered 2020-07-17: 10 mL

## 2020-07-17 MED ORDER — METOPROLOL TARTRATE 5 MG/5ML IV SOLN: 5.0000 mg | Freq: Four times a day (QID) | INTRAVENOUS | Status: DC | PRN

## 2020-07-17 MED ORDER — ONDANSETRON HCL 4 MG PO TABS: 8.0000 mg | ORAL_TABLET | Freq: Two times a day (BID) | ORAL | Status: DC | PRN

## 2020-07-17 MED ORDER — SODIUM CHLORIDE 0.9 % IR SOLN
Status: DC | PRN
  Administered 2020-07-17: 1000 mL

## 2020-07-17 MED ORDER — SODIUM CHLORIDE 0.9 % IV SOLN: Freq: Three times a day (TID) | INTRAVENOUS | Status: DC | PRN

## 2020-07-17 MED ORDER — FENTANYL CITRATE (PF) 100 MCG/2ML IJ SOLN: 25.0000 ug | INTRAMUSCULAR | Status: DC | PRN

## 2020-07-17 MED ORDER — SUGAMMADEX SODIUM 200 MG/2ML IV SOLN
INTRAVENOUS | Status: DC | PRN
  Administered 2020-07-17: 400 mg via INTRAVENOUS

## 2020-07-17 MED ORDER — NEOMYCIN SULFATE 500 MG PO TABS: 1000.0000 mg | ORAL_TABLET | ORAL | Status: DC

## 2020-07-17 MED ORDER — FENTANYL CITRATE (PF) 250 MCG/5ML IJ SOLN
INTRAMUSCULAR | Status: AC
  Filled 2020-07-17: qty 5

## 2020-07-17 MED ORDER — B COMPLEX PO TABS: 2.0000 | ORAL_TABLET | Freq: Every day | ORAL | Status: DC

## 2020-07-17 MED ORDER — BUPIVACAINE-EPINEPHRINE (PF) 0.25% -1:200000 IJ SOLN
INTRAMUSCULAR | Status: DC | PRN
  Administered 2020-07-17: 60 mL

## 2020-07-17 MED ORDER — ONDANSETRON HCL 4 MG/2ML IJ SOLN: 4.0000 mg | Freq: Four times a day (QID) | INTRAMUSCULAR | Status: DC | PRN

## 2020-07-17 MED ORDER — SPY AGENT GREEN - (INDOCYANINE FOR INJECTION)
INTRAMUSCULAR | Status: DC | PRN
  Administered 2020-07-17: 5 mL via INTRAVENOUS

## 2020-07-17 MED ORDER — VITAMIN D3 25 MCG (1000 UNIT) PO TABS
1000.0000 [IU] | ORAL_TABLET | Freq: Every day | ORAL | Status: DC
  Administered 2020-07-18 – 2020-07-23 (×6): 1000 [IU] via ORAL
  Filled 2020-07-17 (×6): qty 1

## 2020-07-17 MED ORDER — LIDOCAINE HCL 2 % IJ SOLN
INTRAMUSCULAR | Status: AC
  Filled 2020-07-17: qty 20

## 2020-07-17 MED ORDER — ROSUVASTATIN CALCIUM 5 MG PO TABS
5.0000 mg | ORAL_TABLET | Freq: Every day | ORAL | Status: DC
  Administered 2020-07-17 – 2020-07-23 (×7): 5 mg via ORAL
  Filled 2020-07-17 (×7): qty 1

## 2020-07-17 MED ORDER — ALVIMOPAN 12 MG PO CAPS
12.0000 mg | ORAL_CAPSULE | Freq: Two times a day (BID) | ORAL | Status: DC
  Administered 2020-07-18 – 2020-07-19 (×4): 12 mg via ORAL
  Filled 2020-07-17 (×4): qty 1

## 2020-07-17 MED ORDER — ENOXAPARIN SODIUM 40 MG/0.4ML ~~LOC~~ SOLN
40.0000 mg | SUBCUTANEOUS | Status: DC
  Administered 2020-07-18 – 2020-07-23 (×6): 40 mg via SUBCUTANEOUS
  Filled 2020-07-17 (×6): qty 0.4

## 2020-07-17 MED ORDER — TRAZODONE HCL 100 MG PO TABS
200.0000 mg | ORAL_TABLET | Freq: Every day | ORAL | Status: DC
  Administered 2020-07-17 – 2020-07-23 (×4): 200 mg via ORAL
  Filled 2020-07-17 (×4): qty 2

## 2020-07-17 MED ORDER — PREDNISONE 5 MG PO TABS
5.0000 mg | ORAL_TABLET | ORAL | Status: DC
  Administered 2020-07-18 – 2020-07-20 (×2): 5 mg via ORAL
  Filled 2020-07-17 (×2): qty 1

## 2020-07-17 MED ORDER — MIDAZOLAM HCL 2 MG/2ML IJ SOLN
INTRAMUSCULAR | Status: AC
  Filled 2020-07-17: qty 2

## 2020-07-17 MED ORDER — LACTATED RINGERS IR SOLN
Status: DC | PRN
  Administered 2020-07-17: 1000 mL

## 2020-07-17 MED ORDER — SODIUM CHLORIDE 0.9 % IV SOLN
2.0000 g | Freq: Two times a day (BID) | INTRAVENOUS | Status: AC
  Administered 2020-07-17: 2 g via INTRAVENOUS
  Filled 2020-07-17: qty 2

## 2020-07-17 MED ORDER — GLUCERNA SHAKE PO LIQD
237.0000 mL | Freq: Two times a day (BID) | ORAL | Status: DC
  Administered 2020-07-18 – 2020-07-23 (×9): 237 mL via ORAL
  Filled 2020-07-17 (×13): qty 237

## 2020-07-17 MED ORDER — ENALAPRILAT 1.25 MG/ML IV SOLN
0.6250 mg | Freq: Four times a day (QID) | INTRAVENOUS | Status: DC | PRN
  Filled 2020-07-17: qty 1

## 2020-07-17 MED ORDER — KETAMINE HCL 10 MG/ML IJ SOLN
INTRAMUSCULAR | Status: DC | PRN
  Administered 2020-07-17: 50 mg via INTRAVENOUS

## 2020-07-17 MED ORDER — RIZATRIPTAN BENZOATE 10 MG PO TBDP: 10.0000 mg | ORAL_TABLET | ORAL | Status: DC

## 2020-07-17 MED ORDER — PANTOPRAZOLE SODIUM 40 MG PO TBEC
40.0000 mg | DELAYED_RELEASE_TABLET | Freq: Every day | ORAL | Status: DC
  Administered 2020-07-17 – 2020-07-23 (×7): 40 mg via ORAL
  Filled 2020-07-17 (×8): qty 1

## 2020-07-17 MED ORDER — CHLORHEXIDINE GLUCONATE 0.12 % MT SOLN: 15.0000 mL | Freq: Once | OROMUCOSAL | Status: AC

## 2020-07-17 MED ORDER — DONEPEZIL HCL 10 MG PO TABS
10.0000 mg | ORAL_TABLET | Freq: Every day | ORAL | Status: DC
  Administered 2020-07-17 – 2020-07-18 (×2): 10 mg via ORAL
  Filled 2020-07-17 (×2): qty 1

## 2020-07-17 MED ORDER — DEXAMETHASONE SODIUM PHOSPHATE 10 MG/ML IJ SOLN
INTRAMUSCULAR | Status: AC
  Filled 2020-07-17: qty 1

## 2020-07-17 MED ORDER — METHYLENE BLUE 0.5 % INJ SOLN
INTRAVENOUS | Status: AC
  Filled 2020-07-17: qty 10

## 2020-07-17 MED ORDER — DIPHENHYDRAMINE HCL 12.5 MG/5ML PO ELIX: 12.5000 mg | ORAL_SOLUTION | Freq: Four times a day (QID) | ORAL | Status: DC | PRN

## 2020-07-17 SURGICAL SUPPLY — 114 items
APPLIER CLIP 5 13 M/L LIGAMAX5 (MISCELLANEOUS)
APPLIER CLIP ROT 10 11.4 M/L (STAPLE)
BAG URO CATCHER STRL LF (MISCELLANEOUS) ×4 IMPLANT
BLADE EXTENDED COATED 6.5IN (ELECTRODE) IMPLANT
CANNULA REDUC XI 12-8 STAPL (CANNULA)
CANNULA REDUCER 12-8 DVNC XI (CANNULA) IMPLANT
CATH FOLEY 3WAY 30CC 16FR (CATHETERS) ×4 IMPLANT
CATH URET 5FR 28IN OPEN ENDED (CATHETERS) ×4 IMPLANT
CELLS DAT CNTRL 66122 CELL SVR (MISCELLANEOUS) IMPLANT
CHLORAPREP W/TINT 26 (MISCELLANEOUS) IMPLANT
CLIP APPLIE 5 13 M/L LIGAMAX5 (MISCELLANEOUS) IMPLANT
CLIP APPLIE ROT 10 11.4 M/L (STAPLE) IMPLANT
CLOTH BEACON ORANGE TIMEOUT ST (SAFETY) ×4 IMPLANT
COVER SURGICAL LIGHT HANDLE (MISCELLANEOUS) ×8 IMPLANT
COVER TIP SHEARS 8 DVNC (MISCELLANEOUS) ×3 IMPLANT
COVER TIP SHEARS 8MM DA VINCI (MISCELLANEOUS) ×1
COVER WAND RF STERILE (DRAPES) ×4 IMPLANT
DEVICE TROCAR PUNCTURE CLOSURE (ENDOMECHANICALS) IMPLANT
DRAIN CHANNEL 19F RND (DRAIN) ×4 IMPLANT
DRAPE ARM DVNC X/XI (DISPOSABLE) ×12 IMPLANT
DRAPE COLUMN DVNC XI (DISPOSABLE) ×3 IMPLANT
DRAPE DA VINCI XI ARM (DISPOSABLE) ×4
DRAPE DA VINCI XI COLUMN (DISPOSABLE) ×1
DRAPE SURG IRRIG POUCH 19X23 (DRAPES) ×4 IMPLANT
DRSG OPSITE POSTOP 4X10 (GAUZE/BANDAGES/DRESSINGS) IMPLANT
DRSG OPSITE POSTOP 4X6 (GAUZE/BANDAGES/DRESSINGS) ×4 IMPLANT
DRSG OPSITE POSTOP 4X8 (GAUZE/BANDAGES/DRESSINGS) IMPLANT
DRSG TEGADERM 2-3/8X2-3/4 SM (GAUZE/BANDAGES/DRESSINGS) ×20 IMPLANT
DRSG TEGADERM 4X4.75 (GAUZE/BANDAGES/DRESSINGS) IMPLANT
ELECT PENCIL ROCKER SW 15FT (MISCELLANEOUS) ×4 IMPLANT
ELECT REM PT RETURN 15FT ADLT (MISCELLANEOUS) ×4 IMPLANT
ENDOLOOP SUT PDS II  0 18 (SUTURE)
ENDOLOOP SUT PDS II 0 18 (SUTURE) IMPLANT
EVACUATOR SILICONE 100CC (DRAIN) ×4 IMPLANT
GAUZE SPONGE 2X2 8PLY STRL LF (GAUZE/BANDAGES/DRESSINGS) ×3 IMPLANT
GLOVE BIOGEL M STRL SZ7.5 (GLOVE) ×4 IMPLANT
GLOVE ECLIPSE 8.0 STRL XLNG CF (GLOVE) ×12 IMPLANT
GLOVE INDICATOR 8.0 STRL GRN (GLOVE) ×12 IMPLANT
GOWN STRL REUS W/TWL XL LVL3 (GOWN DISPOSABLE) ×16 IMPLANT
GRASPER SUT TROCAR 14GX15 (MISCELLANEOUS) IMPLANT
GUIDEWIRE STR DUAL SENSOR (WIRE) IMPLANT
GUIDEWIRE ZIPWRE .038 STRAIGHT (WIRE) ×4 IMPLANT
HOLDER FOLEY CATH W/STRAP (MISCELLANEOUS) ×4 IMPLANT
IRRIG SUCT STRYKERFLOW 2 WTIP (MISCELLANEOUS) ×4
IRRIGATION SUCT STRKRFLW 2 WTP (MISCELLANEOUS) ×3 IMPLANT
KIT PROCEDURE DA VINCI SI (MISCELLANEOUS) ×1
KIT PROCEDURE DVNC SI (MISCELLANEOUS) ×3 IMPLANT
KIT SIGMOIDOSCOPE (SET/KITS/TRAYS/PACK) ×4 IMPLANT
KIT TURNOVER KIT A (KITS) IMPLANT
MANIFOLD NEPTUNE II (INSTRUMENTS) ×4 IMPLANT
NDL SAFETY ECLIPSE 18X1.5 (NEEDLE) ×3 IMPLANT
NEEDLE HYPO 18GX1.5 SHARP (NEEDLE) ×1
NEEDLE INSUFFLATION 14GA 120MM (NEEDLE) ×4 IMPLANT
PACK CARDIOVASCULAR III (CUSTOM PROCEDURE TRAY) ×4 IMPLANT
PACK COLON (CUSTOM PROCEDURE TRAY) ×4 IMPLANT
PACK CYSTO (CUSTOM PROCEDURE TRAY) ×4 IMPLANT
PAD POSITIONING PINK XL (MISCELLANEOUS) ×4 IMPLANT
PLUG CATH AND CAP STER (CATHETERS) ×4 IMPLANT
PORT LAP GEL ALEXIS MED 5-9CM (MISCELLANEOUS) ×4 IMPLANT
PROTECTOR NERVE ULNAR (MISCELLANEOUS) ×4 IMPLANT
RELOAD STAPLER 3.5X45 BLU DVNC (STAPLE) IMPLANT
RELOAD STAPLER 4.3X45 GRN DVNC (STAPLE) IMPLANT
RELOAD STAPLER 4.3X60 GRN DVNC (STAPLE) ×3 IMPLANT
RTRCTR WOUND ALEXIS 18CM MED (MISCELLANEOUS)
SCISSORS LAP 5X35 DISP (ENDOMECHANICALS) ×4 IMPLANT
SEAL CANN UNIV 5-8 DVNC XI (MISCELLANEOUS) ×12 IMPLANT
SEAL XI 5MM-8MM UNIVERSAL (MISCELLANEOUS) ×4
SEALER VESSEL DA VINCI XI (MISCELLANEOUS) ×1
SEALER VESSEL EXT DVNC XI (MISCELLANEOUS) ×3 IMPLANT
SET IRRIG Y TYPE TUR BLADDER L (SET/KITS/TRAYS/PACK) ×4 IMPLANT
SOLUTION ELECTROLUBE (MISCELLANEOUS) ×4 IMPLANT
SPONGE GAUZE 2X2 STER 10/PKG (GAUZE/BANDAGES/DRESSINGS) ×1
STAPLER 45 DA VINCI SURE FORM (STAPLE)
STAPLER 45 SUREFORM DVNC (STAPLE) IMPLANT
STAPLER 60 DA VINCI SURE FORM (STAPLE) ×1
STAPLER 60 SUREFORM DVNC (STAPLE) ×3 IMPLANT
STAPLER CANNULA SEAL DVNC XI (STAPLE) ×3 IMPLANT
STAPLER CANNULA SEAL XI (STAPLE) ×1
STAPLER ECHELON POWER CIR 29 (STAPLE) IMPLANT
STAPLER ECHELON POWER CIR 31 (STAPLE) ×4 IMPLANT
STAPLER RELOAD 3.5X45 BLU DVNC (STAPLE)
STAPLER RELOAD 3.5X45 BLUE (STAPLE)
STAPLER RELOAD 4.3X45 GREEN (STAPLE)
STAPLER RELOAD 4.3X45 GRN DVNC (STAPLE)
STAPLER RELOAD 4.3X60 GREEN (STAPLE) ×1
STAPLER RELOAD 4.3X60 GRN DVNC (STAPLE) ×3
STOPCOCK 4 WAY LG BORE MALE ST (IV SETS) ×8 IMPLANT
SURGILUBE 2OZ TUBE FLIPTOP (MISCELLANEOUS) ×4 IMPLANT
SUT MNCRL AB 4-0 PS2 18 (SUTURE) ×4 IMPLANT
SUT PDS AB 1 CT1 27 (SUTURE) ×8 IMPLANT
SUT PROLENE 0 CT 2 (SUTURE) IMPLANT
SUT PROLENE 2 0 KS (SUTURE) ×4 IMPLANT
SUT PROLENE 2 0 SH DA (SUTURE) ×4 IMPLANT
SUT SILK 2 0 (SUTURE)
SUT SILK 2 0 SH CR/8 (SUTURE) IMPLANT
SUT SILK 2-0 18XBRD TIE 12 (SUTURE) IMPLANT
SUT SILK 3 0 (SUTURE)
SUT SILK 3 0 SH CR/8 (SUTURE) ×4 IMPLANT
SUT SILK 3-0 18XBRD TIE 12 (SUTURE) IMPLANT
SUT V-LOC BARB 180 2/0GR6 GS22 (SUTURE) ×12
SUT VIC AB 3-0 SH 18 (SUTURE) IMPLANT
SUT VIC AB 3-0 SH 27 (SUTURE)
SUT VIC AB 3-0 SH 27XBRD (SUTURE) IMPLANT
SUT VICRYL 0 UR6 27IN ABS (SUTURE) ×4 IMPLANT
SUTURE V-LC BRB 180 2/0GR6GS22 (SUTURE) ×9 IMPLANT
SYR 10ML ECCENTRIC (SYRINGE) ×4 IMPLANT
SYR 10ML LL (SYRINGE) ×4 IMPLANT
SYS LAPSCP GELPORT 120MM (MISCELLANEOUS)
SYSTEM LAPSCP GELPORT 120MM (MISCELLANEOUS) IMPLANT
TOWEL OR NON WOVEN STRL DISP B (DISPOSABLE) ×4 IMPLANT
TRAY FOLEY MTR SLVR 16FR STAT (SET/KITS/TRAYS/PACK) ×4 IMPLANT
TROCAR ADV FIXATION 5X100MM (TROCAR) ×4 IMPLANT
TUBING CONNECTING 10 (TUBING) ×12 IMPLANT
TUBING INSUFFLATION 10FT LAP (TUBING) ×4 IMPLANT

## 2020-07-17 NOTE — Progress Notes (Signed)
Pt home CPAP inspected by RT for damages/defects, none were found.  Pt's machine is working properly at this time.

## 2020-07-17 NOTE — Op Note (Signed)
07/17/2020  1:33 PM  PATIENT:  Valerie [name]  59 y.o. 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  This assistant was needed for exposure, dissection, suctioning, retraction, instrument exchange, etc.   ANESTHESIA:     General  Nerve block provided with liposomal bupivacaine (Experel) mixed with 0.25% bupivacaine as a Bilateral TAP block x 78mL each side at the level of the transverse abdominis & preperitoneal spaces along the flank at the anterior axillary line, from subcostal ridge to iliac crest under laparoscopic guidance   Local field block at port sites & extraction wound  EBL:  Total I/O In: 1600 [I.V.:1500; IV Piggyback:100] Out: 200 [Urine:100; Blood:100]  Delay start of Pharmacological VTE agent (>24hrs) due to surgical blood loss or risk of bleeding:  no  DRAINS: 19 Fr Blake drain goes to the pelvis  SPECIMEN:   RECTOSIGMOID COLON (open end proximal) DISTAL ANASTOMOTIC RING (final distal margin) OMENTUM  DISPOSITION OF  SPECIMEN:  PATHOLOGY  COUNTS:  YES  PLAN OF CARE: Admit to inpatient   PATIENT DISPOSITION:  PACU - hemodynamically stable.  INDICATION:    Woman with morbid obesity and chronically immunosuppressed for psoriatic arthritis.  Found to have diverticulitis with development of colovesical fistula with recurrent urinary tract infections.  No evidence of malignancy.  Eventually somewhat stabilized.  Placed on parenteral nutrition to improve nutritional status.  Recommendation made for robotic resection with possible bladder repair.  I recommended segmental resection:  The anatomy & physiology of the digestive tract was discussed.  The pathophysiology was discussed.  Natural history risks without surgery was discussed.   I worked to give an overview of the disease and the frequent need to have multispecialty involvement.  I feel the risks of no intervention will lead to serious problems that outweigh the operative risks; therefore, I recommended a partial colectomy to remove the pathology.  Laparoscopic & open techniques were discussed.   Risks such as bleeding, infection, abscess, leak, reoperation, possible ostomy, hernia, heart attack, death, and other risks were discussed.  I noted a good likelihood this will help address the problem.   Goals of post-operative recovery were discussed as well.  We will work to minimize complications.  Educational materials on the pathology had been given in the office.  Questions were answered.    The patient expressed understanding & wished to proceed with surgery.  OR FINDINGS:   Patient had very inflamed sigmoid colon densely adherent to left lower quadrant and entire dome of bladder towards the right pelvis.  Significant colovesical fistula at the left superior dome of the bladder.  Primary repair and omentopexy with omental pedicle flap done.  No obvious metastatic disease on visceral  parietal peritoneum or liver.  The anastomosis rests ~12 cm from the anal  verge by rigid proctoscopy.  CASE DATA:  Type of patient?: LDOW CASE (Surgical Hospitalist [facility] Inpatient)  Status of Case? Elective Scheduled  Infection Present At Time Of Surgery (PATOS)?  PHLEGMON  DESCRIPTION:   Informed consent was confirmed.  The patient underwent general anaesthesia without difficulty.  The patient was positioned appropriately.  VTE prevention in place.  Patient underwent cystoscopy with firefly immunofluorescent injection of bladder and ureters by Dr. Alinda Money.  Please see his separate operative note.  The patient was clipped, prepped, & draped in a sterile fashion.  Surgical timeout confirmed our plan.  The patient was positioned in reverse Trendelenburg.  Abdominal entry was gained using Varess technique at the left subcostal ridge on the anterior abdominal wall.  No elevated EtCO2 noted.  Port placed.  Camera inspection revealed no injury.  Extra ports were carefully placed under direct laparoscopic visualization.  I did some laparoscopically adhesions to free the omentum off the lower abdomen to allow port placement.  Patient had prior history of open cholecystectomy as well as open appendectomy I reflected the greater omentum and the upper abdomen the small bowel in the upper abdomen.  The patient was carefully positioned.  The Intuitive daVinci robot was docked with camera & instruments carefully placed.  The patient had major phlegmon involving the entire sigmoid colon.  I work to help free some of the attachments of the rectosigmoid colon off the right pelvis to identify and preserve the right adnexa.  I came around anteriorly but was clear she had very dense adhesions to most of the dome of the bladder.  I proceeded to go to a medial to lateral approach since there seemed to not have strong inflammation to the retroperitoneum.  I mobilized the rectosigmoid colon & elevated it to put the main pedicle on tension.  I scored the base of peritoneum of the medial side of the  mesentery of the elevated left colon from the ligament of Treitz to the mid rectum.   I elevated the sigmoid mesentery and entered into the retro-mesenteric plane. We were able to identify the left ureter and gonadal vessels. We kept those posterior within the retroperitoneum and elevated the left colon mesentery off that. I did isolate the inferior mesenteric artery (IMA) pedicle but did not ligate it yet.  I continued distally and got into the avascular plane posterior to the mesorectum, sparing the nervi ergentes.. This allowed me to help mobilize the rectum as well by freeing the mesorectum off the sacrum.  I stayed away from the right and left ureters.  I kept the lateral vascular pedicles to the rectum intact.  I skeletonized the lymph nodes off the inferior mesenteric artery pedicle.  I went down to its takeoff from the aorta.  I isolated the inferior mesenteric vein off of the ligament of Treitz just cephalad to that as well.  After confirming the left ureter was out of the way, I went ahead and ligated the inferior mesenteric artery pedicle just near its takeoff from the aorta.  I did ligate the inferior mesenteric vein in a similar fashion.  We ensured hemostasis.  I proceeded to free the left colon mesentery off the retroperitoneum including the left kidney up towards the splenic flexure and inferior pancreatic ridge.  There is still some tension on the mesentery of the left colon which was somewhat foreshortened given the chronic inflammation of the sigmoid and even  lower descending colon.  And transecting more mesentery until it came to the middle colic pedicle.  Return to dissection of the colovesical fistula.  Freed the rectosigmoid mesentery off the retroperitoneum and came up to the left lateral pelvis.  Worked in a lateral to medial fashion to free the left colon in a lateral medial fashion until it came to the left anterior pelvis.  Was able to free off adhesions to the left adnexa and came  around the bladder.  Worked from the rectum side on the right pelvis to come up more proximally.  Started transect through the adhesions tween the sigmoid and bladder dome taking down the colovesical fistula.  Encountered a ball of stool as well as a small abscess.  Aspirated control these.  Stool removed.  Eventually was able to take the colon down.  With this I can place the rectosigmoid colon on axial tension.  Focused on dissection of the proximal rectum to free it off the posterior pelvis and come around the anterior pedicles.  Shows an area at the proximal/mid rectal junction that was distal to all the inflammation.  I transected through the mesial rectum using a vessel sealer.  Became apparent that the colon was not going to reach down the pelvis well.  Therefore I completed splenic flexure mobilization.  Found the mid transverse colon and freed the greater omentum off it and continued distally until I got to the splenic flexure.  With this I could get into the retrogastric space.  Freddrick March off the distal transverse colon adhesions to the inferior pancreatic ridge and retroperitoneum to mobilize the entire left side of the colon.  Came more proximally until it came to the middle colic pedicle which I preserved.  We also focused on creating omental pedicle flap.  I transected the omentum off the greater curvature of the stomach up to the short gastrics.  Transected across the omentum just inferior to the splenic tip.  With this her omentum could reach down towards the pelvis.  Her right side omentum was densely adhered and somewhat obliterated given her prior cholecystectomy.  However we were able to mobilize enough off so we could have a decent tongue of omentum to reach down.  I had the circulator infuse the bladder with methylene blue stained isotonic fluid to insufflate the bladder.  Had 2 openings on the left anterior dome of the bladder that leaked out a few millimeters in size but a 15 mm region.  I  primarily closed the persistent bladder defect using 2 OV lock running Connell style suture to help imbricate the region.  We reinsufflated and noted one other corner leaking.  I ran another stitch V-Loc 2 imbricate the prior suture and close this down.  Reinsufflated and no more leak.  I chose a region at the mid descending colon that was soft and easily reached down to the rectal stump.  I transected the mesentery of the colon radially to preserve remaining colon blood supply.  We asked anesthesia to dilute the indocyanine green (ICG) to 10 mL and inject 3 mL intravenously with IV flush.  I switched to the NIR fluorescence (Firefly mode) imaging window on the daVinci platform.  I was able to see good light green visualization of blood vessels with good perfusion of tissues, confirming good tissue perfusion of both ends planned for anastomosis. We also inspected the omentum.  A tongue of omentum had no good blood supply so this was transected off until the omental pedicle  flap had good perfusion.  It could reach down to the pelvis.  I transected at the proximal/mid rectum junction using a robotic stapler. 60 mm green load.  I created an extraction incision through a small Pfannenstiel incision in the suprapubic region.  Placed a wound protector.  I was able to eviscerate the rectosigmoid and descending colon out the wound.   I clamped the colon proximal to this area using a reusable pursestringer device.  Passed a 2-0 Keith needle. I transected at the descending/sigmoid junction with a scalpel. I got healthy bleeding mucosa.  We sent the rectosigmoid colon specimen off to go to pathology.  We sized the colon orifice.  I chose a 51mm EEA anvil stapler system.  I reinforced the prolene pursestring with interrupted silk suture.  I placed the anvil to the open end of the proximal remaining colon and closed around it using the pursestring.    We did copious irrigation with crystalloid solution.  Hemostasis was  good.  The distal end of the remaining colon easily reached down to the rectal stump.     The robot was redocked.  .Dr Thermon Leyland scrubbed down and did gentle anal dilation and advanced the EEA stapler up the rectal stump. The spike was brought out at the provimal end of the rectal stump under direct visualization.  I  attached the anvil of the proximal colon the spike of the stapler. Anvil was tightened down and held clamped for 60 seconds. The EEA stapler was fired and held clamped for 30 seconds. The stapler was released & removed. We noted 2 excellent anastomotic rings. Blue stitch is in the proximal ring.  We irrigation of isotonic solution & held that for the pelvic air leak test .  The rectum was insufflated the rectum while clamping the colon proximal to that anastomosis.  There was a negative air leak test. There was no tension of mesentery or bowel at the anastomosis.   Tissues looked viable.  Ureters & bowel uninjured.  The anastomosis looked healthy.   Greater omentum positioned down into the pelvis to help protect the anastomosis.  I proceeded to do an omentopexy using 2 OV lock around most of the dome of the bladder in a running half pursestring between bladder and omental pedicle flap on the right and left.  This provided good healthy patch.  Had a drain placed to go down the pelvis down to the pelvis as well as around the repair of the colovesical fistula.  We already done tap blocks the start of the case.  Endoluminal gas was evacuated.  Ports & wound protector removed.  We changed gloves & redraped the patient per colon SSI prevention protocol.  We aspirated the antibiotic irrigation.  Hemostasis was good.  Sterile unused instruments were used from this point.  I closed the skin at the port sites using Monocryl stitch and sterile dressing.  I closed the extraction wound using a 0 Vicryl vertical peritoneal closure and a #1 PDS transverse anterior rectal fascial closure like a small  Pfannenstiel closure. I closed the skin with some interrupted Monocryl stitches.  I placed sterile dressings.     Patient is being extubated go to recovery room. I had discussed postop care with the patient in detail the office & in the holding area. Instructions are written. I discussed operative findings, updated the patient's status, discussed probable steps to recovery, and gave postoperative recommendations to the patient's daughter, [name]..  Recommendations were made.  Questions were answered.  She expressed understanding & appreciation.  [name], M.D., F.A.C.S. Gastrointestinal and Minimally Invasive Surgery [facility] [address], [place], [place] [address] [phone] Main / Paging

## 2020-07-17 NOTE — Op Note (Signed)
Preoperative diagnosis:  1. Diverticulitis with colovesical fistula  Postoperative diagnosis:  1. Diverticulitis with colovesical fistula  Procedure:  1. Cystoscopy 2. Bilateral ureteral cannulation and injection of indocyanine green dye  Surgeon: Pryor Curia. M.D.  Anesthesia: General  Complications: None  Intraoperative findings: There was large amount of fecal material circulating in the bladder with a defect posteriorly consistent with her known colovesical fistula.  EBL: Minimal  Specimens: None  Indication: Valerie [name] is a 59 y.o. patient with diverticulitis and a colovesical fistula. I was asked to assist Dr. Johney Maine by performing the above procedures to assist him with intraoperative identification of the ureters. After reviewing the management options for treatment, she elected to proceed with the above surgical procedure(s). We have discussed the potential benefits and risks of the procedure, side effects of the proposed treatment, the likelihood of the patient achieving the goals of the procedure, and any potential problems that might occur during the procedure or recuperation. Informed consent has been obtained.  Description of procedure:  The patient was taken to the operating room and general anesthesia was induced.  The patient was placed in the dorsal lithotomy position, prepped and draped in the usual sterile fashion, and preoperative antibiotics were administered. A preoperative time-out was performed.   Cystourethroscopy was performed.  The patients urethra was examined and was normal. The bladder was then systematically examined in its entirety. Findings are as dictated as above.  Attention then turned to the right ureteral orifice and a 5 Fr ureteral catheter was used to intubate the ureteral orifice.  7 cc of indocyanine green dye was injected through the ureteral catheter.  Attention then turned to the left ureteral orifice and a 5 Fr ureteral  catheter was used to intubate the ureteral orifice.  7 cc of indocyanine green dye was injected through the ureteral catheter.  A 3 way catheter was then inserted into the bladder.  The case was then turned over to Dr. Johney Maine for the remainder of the procedure.  The patient appeared to tolerate this portion of the procedure well and without complications.    Pryor Curia MD

## 2020-07-17 NOTE — Transfer of Care (Signed)
Immediate Anesthesia Transfer of Care Note  [name]  Procedure(s) Performed: ROBOTIC LOW ANTERIOR RESECTION, TAP BLOCK, COLOVESICAL FISTULA REPAIR, MOBILIZATION OF SPLENIC FLEXURE, OMENTAL PEDICLE FLAP (N/A Abdomen) RIGID PROCTOSCOPY (N/A Rectum) CYSTOSCOPY WITH BILATERAL FIREFLY INJECTION (N/A )  Patient Location: PACU  Anesthesia Type:General  Level of Consciousness: drowsy, patient cooperative and responds to stimulation  Airway & Oxygen Therapy: Patient Spontanous Breathing and Patient connected to face mask oxygen  Post-op Assessment: Report given to RN, Post -op Vital signs reviewed and stable and Patient moving all extremities  Post vital signs: Reviewed and stable  Last Vitals:  Vitals Value Taken Time  BP 124/102 07/17/20 1317  Temp    Pulse 65 07/17/20 1322  Resp    SpO2 92 % 07/17/20 1322  Vitals shown include unvalidated device data.  Last Pain:  Vitals:   07/17/20 0616  TempSrc: Oral         Complications: No complications documented.

## 2020-07-17 NOTE — Anesthesia Postprocedure Evaluation (Signed)
Anesthesia Post Note  Patient: [name]  Procedure(s) Performed: ROBOTIC LOW ANTERIOR RESECTION, TAP BLOCK, COLOVESICAL FISTULA REPAIR, MOBILIZATION OF SPLENIC FLEXURE, OMENTAL PEDICLE FLAP (N/A Abdomen) RIGID PROCTOSCOPY (N/A Rectum) CYSTOSCOPY WITH BILATERAL FIREFLY INJECTION (N/A )     Patient location during evaluation: PACU Anesthesia Type: General Level of consciousness: awake and alert Pain management: pain level controlled Vital Signs Assessment: post-procedure vital signs reviewed and stable Respiratory status: spontaneous breathing, nonlabored ventilation, respiratory function stable and patient connected to nasal cannula oxygen Cardiovascular status: blood pressure returned to baseline and stable Postop Assessment: no apparent nausea or vomiting Anesthetic complications: no   No complications documented.  Last Vitals:  Vitals:   07/17/20 1400 07/17/20 1415  BP: 133/63 127/71  Pulse: 65 68  Resp: 17 16  Temp:    SpO2: 93% 93%    Last Pain:  Vitals:   07/17/20 1318  TempSrc:   PainSc: Asleep                 Madhavi Hamblen

## 2020-07-17 NOTE — Interval H&P Note (Signed)
History and Physical Interval Note:  07/17/2020 7:14 AM  Valerie [name]  has presented today for surgery, with the diagnosis of colovesical fistula.  The various methods of treatment have been discussed with the patient and family. After consideration of risks, benefits and other options for treatment, the patient has consented to  Procedure(s): ROBOTIC RESECTION OF COLON, RECTOSIGMOID, POSSIBLE BLADDER REPAIR, (N/A) POSSIBLE OSTOMY (N/A) RIGID PROCTOSCOPY (N/A) CYSTOSCOPY WITH FIREFLY STENT PLACEMENT (N/A) as a surgical intervention.  The patient's history has been reviewed, patient examined, no change in status, stable for surgery.  I have reviewed the patient's chart and labs.  Questions were answered to the patient's satisfaction.     I have re-reviewed the the patient's records, history, medications, and allergies.  I have re-examined the patient.  I again discussed intraoperative plans and goals of post-operative recovery.  The patient agrees to proceed.  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[place]) 05/20/2020   Intra-abdominal infection    Acute colitis 05/14/2020   NSTEMI (non-ST elevated myocardial infarction) ([place]) 11/14/2019   DM type 2 (diabetes mellitus, type 2) ([place]) 11/14/2019   Depression    Anxiety    Chest pain    Obesity, Class III, BMI  40-49.9 (morbid obesity) ([place])    CKD (chronic kidney disease), stage III ([place])    Psoriatic arthritis ([place])    Tobacco abuse    OSA (obstructive sleep apnea)    Neuropathy    Bipolar disorder ([place])     Past Medical History:  Diagnosis Date   Anemia    Anxiety    Arthritis    Psoriatic   Bipolar disorder ([place])    CKD (chronic kidney disease), stage III ([place])    Colovesical fistula    Depression    DM type 2 (diabetes mellitus, type 2) ([place]) 11/14/2019   Dyspnea    Headache    Neuropathy    NSTEMI (non-ST elevated myocardial infarction) ([place]) 11/14/2019   OSA (obstructive sleep apnea)    Psoriatic arthritis ([place])    Sleep apnea    Stroke [facility])    Memory issues, March 2021    Past Surgical History:  Procedure Laterality Date   APPENDECTOMY     CHOLECYSTECTOMY     FLEXIBLE SIGMOIDOSCOPY N/A 05/17/2020   Procedure: FLEXIBLE SIGMOIDOSCOPY;  Surgeon: Carol Ada, MD;  Location: [facility];  Service: Endoscopy;  Laterality: N/A;   FOOT SURGERY Right    LEFT HEART CATH AND CORONARY ANGIOGRAPHY N/A 11/14/2019   Procedure: LEFT HEART CATH AND CORONARY ANGIOGRAPHY;  Surgeon: Martinique, Peter M, MD;  Location: [place] [facility];  Service: Cardiovascular;  Laterality: N/A;   TONSILLECTOMY     TOTAL HIP ARTHROPLASTY Bilateral     Social History   Socioeconomic History   Marital status: Divorced    Spouse name: Not on file   Number of children: Not on file   Years of education: Not on  file   Highest education level: Not on file  Occupational History   Not on file  Tobacco Use   Smoking status: Current Every Day Smoker    Packs/day: 0.50   Smokeless tobacco: Never Used  Vaping Use   Vaping Use: Never used  Substance and Sexual Activity   Alcohol use: Not Currently   Drug use: Never   Sexual activity: Not on file  Other Topics Concern   Not on file  Social History Narrative   Not on file   Social Determinants of Health   Financial Resource Strain:    Difficulty of  Paying Living Expenses: Not on file  Food Insecurity:    Worried About [place] in the Last Year: Not on file   Ran Out of Food in the Last Year: Not on file  Transportation Needs:    Lack of Transportation (Medical): Not on file   Lack of Transportation (Non-Medical): Not on file  Physical Activity:    Days of Exercise per Week: Not on file   Minutes of Exercise per Session: Not on file  Stress:    Feeling of Stress : Not on file  Social Connections:    Frequency of Communication with Friends and Family: Not on file   Frequency of Social Gatherings with Friends and Family: Not on file   Attends Religious Services: Not on file   Active Member of [place] or Organizations: Not on file   Attends Archivist Meetings: Not on file   Marital Status: Not on file  Intimate Partner Violence:    Fear of Current or Ex-Partner: Not on file   Emotionally Abused: Not on file   Physically Abused: Not on file   Sexually Abused: Not on file    Family History  Problem Relation Age of Onset   Breast cancer Neg Hx     Medications Prior to Admission  Medication Sig Dispense Refill Last Dose   ALPRAZolam (XANAX) 1 MG tablet Take 1 mg by mouth 3 (three) times daily as needed for anxiety (depression.).    07/16/2020 at Unknown time   Ascorbic Acid (VITAMIN C) 1000 MG tablet Take 1,000 mg by mouth at bedtime.   07/16/2020 at Unknown time   aspirin EC 81 MG tablet Take 1 tablet (81 mg total) by mouth daily.   07/16/2020 at Unknown time   b complex vitamins tablet Take 2 tablets by mouth at bedtime.   07/16/2020 at Unknown time   Cholecalciferol (VITAMIN D-3) 1000 units CAPS Take 1,000 Units by mouth daily.    07/16/2020 at Unknown time   ciprofloxacin (CIPRO) 500 MG tablet Take 500 mg by mouth 2 (two) times daily.   07/16/2020 at Unknown time   clopidogrel (PLAVIX) 75 MG tablet Take 75 mg by mouth daily.   07/12/2020   docusate sodium (COLACE) 100 MG capsule Take 100-200 mg by mouth  at bedtime.   07/16/2020 at Unknown time   donepezil (ARICEPT) 10 MG tablet Take 10 mg by mouth daily.   07/16/2020 at Unknown time   escitalopram (LEXAPRO) 20 MG tablet Take 30 mg by mouth daily.   0 07/16/2020 at Unknown time   furosemide (LASIX) 20 MG tablet Take 20 mg by mouth every other day.   07/16/2020 at Unknown time   HYDROcodone-acetaminophen (NORCO) 10-325 MG tablet Take 1 tablet by mouth 2 (two) times daily as needed (pain).    07/16/2020 at Unknown time   lamoTRIgine (LAMICTAL) 150 MG tablet  Take 150 mg by mouth 2 (two) times daily.   0 07/16/2020 at Unknown time   nadolol (CORGARD) 20 MG tablet Take 0.5 tablets (10 mg total) by mouth daily. 90 tablet 1 07/16/2020 at Unknown time   nitroGLYCERIN (NITROSTAT) 0.4 MG SL tablet Place 1 tablet (0.4 mg total) under the tongue every 5 (five) minutes as needed for chest pain. 30 tablet 0    omeprazole (PRILOSEC) 20 MG capsule Take 1 capsule (20 mg total) by mouth 2 (two) times daily before a meal. 60 capsule 3 07/16/2020 at Unknown time   ondansetron (ZOFRAN) 8 MG tablet Take 8 mg by mouth 2 (two) times daily as needed for nausea or vomiting.    07/16/2020 at Unknown time   predniSONE (DELTASONE) 10 MG tablet Take 5-10 mg by mouth See admin instructions. Take 5 mg by mouth every other day, alternating with 10 mg on opposing days   07/16/2020 at Unknown time   rosuvastatin (CRESTOR) 5 MG tablet Take 1 tablet (5 mg total) by mouth daily. 90 tablet 3 07/16/2020 at Unknown time   traZODone (DESYREL) 100 MG tablet Take 200 mg by mouth at bedtime.   07/16/2020 at Unknown time   rizatriptan (MAXALT-MLT) 10 MG disintegrating tablet Take 10 mg by mouth See admin instructions. Take one tablet (10 mg) by mouth daily as needed for "floaters", May repeat in 2 hours if still needed   More than a month at Unknown time    Current Facility-Administered Medications  Medication Dose Route Frequency Provider Last Rate Last Admin   bisacodyl (DULCOLAX) EC tablet  20 mg  20 mg Oral Once Michael Boston, MD       bupivacaine liposome (EXPAREL) 1.3 % injection 266 mg  20 mL Infiltration Once Leodis Sias T, RPH       cefoTEtan (CEFOTAN) 2 g in sodium chloride 0.9 % 100 mL IVPB  2 g Intravenous On Call to OR Michael Boston, MD       Derrill Memo ON 07/18/2020] feeding supplement (ENSURE PRE-SURGERY) liquid 296 mL  296 mL Oral Once Michael Boston, MD       lactated ringers infusion   Intravenous Continuous Oleta Mouse, MD 10 mL/hr at 07/17/20 0637 New Bag at 07/17/20 1610     Allergies  Allergen Reactions   Carbamazepine Other (See Comments)    Parkinsons like symptoms tremors   Sertraline Hcl Other (See Comments)    Unknown reaction    BP (!) 120/55   Pulse 77   Temp 98.6 F (37 C) (Oral)   Resp 18   SpO2 93%   Labs: Results for orders placed or performed during the hospital encounter of 07/17/20 (from the past 48 hour(s))  Glucose, capillary     Status: None   Collection Time: 07/17/20  5:57 AM  Result Value Ref Range   Glucose-Capillary 96 70 - 99 mg/dL    Comment: Glucose reference range applies only to samples taken after fasting for at least 8 hours.  CBC with Differential/Platelet     Status: Abnormal   Collection Time: 07/17/20  6:45 AM  Result Value Ref Range   WBC 7.4 4.0 - 10.5 K/uL   RBC 3.03 (L) 3.87 - 5.11 MIL/uL   Hemoglobin 9.7 (L) 12.0 - 15.0 g/dL   HCT 31.7 (L) 36 - 46 %   MCV 104.6 (H) 80.0 - 100.0 fL   MCH 32.0 26.0 - 34.0 pg   MCHC 30.6 30.0 - 36.0 g/dL   RDW  13.8 11.5 - 15.5 %   Platelets 218 150 - 400 K/uL   nRBC 0.0 0.0 - 0.2 %   Neutrophils Relative % 71 %   Neutro Abs 5.3 1.7 - 7.7 K/uL   Lymphocytes Relative 13 %   Lymphs Abs 0.9 0.7 - 4.0 K/uL   Monocytes Relative 11 %   Monocytes Absolute 0.8 0.1 - 1.0 K/uL   Eosinophils Relative 4 %   Eosinophils Absolute 0.3 0.0 - 0.5 K/uL   Basophils Relative 1 %   Basophils Absolute 0.1 0.0 - 0.1 K/uL   Immature Granulocytes 0 %   Abs Immature Granulocytes 0.02  0.00 - 0.07 K/uL    Comment: Performed at [facility], [place] [address], [place], [place] [address]    Imaging / Studies: VAS [place] UPPER EXTREMITY VENOUS DUPLEX  Result Date: 06/18/2020 UPPER VENOUS STUDY  Indications: Swelling Comparison Study: no prior Performing Technologist: Abram Sander RVS  Examination Guidelines: A complete evaluation includes B-mode imaging, spectral Doppler, color Doppler, and power Doppler as needed of all accessible portions of each vessel. Bilateral testing is considered an integral part of a complete examination. Limited examinations for reoccurring indications may be performed as noted.  Right Findings: +----------+------------+---------+-----------+----------+-------+ RIGHT     CompressiblePhasicitySpontaneousPropertiesSummary +----------+------------+---------+-----------+----------+-------+ IJV           Full       Yes       Yes                      +----------+------------+---------+-----------+----------+-------+ Subclavian    Full       Yes       Yes                      +----------+------------+---------+-----------+----------+-------+ Axillary      Full       Yes       Yes                      +----------+------------+---------+-----------+----------+-------+ Brachial      Full       Yes       Yes                      +----------+------------+---------+-----------+----------+-------+ Radial        Full                                          +----------+------------+---------+-----------+----------+-------+ Ulnar         Full                                          +----------+------------+---------+-----------+----------+-------+ Cephalic      Full                                          +----------+------------+---------+-----------+----------+-------+ Basilic       Full                                          +----------+------------+---------+-----------+----------+-------+  Left  Findings: +----------+------------+---------+-----------+----------+-------+  LEFT      CompressiblePhasicitySpontaneousPropertiesSummary +----------+------------+---------+-----------+----------+-------+ IJV           Full       Yes       Yes                      +----------+------------+---------+-----------+----------+-------+ Subclavian    Full       Yes       Yes                      +----------+------------+---------+-----------+----------+-------+ Axillary      Full       Yes       Yes                      +----------+------------+---------+-----------+----------+-------+ Brachial      Full       Yes       Yes                      +----------+------------+---------+-----------+----------+-------+ Radial        Full                                          +----------+------------+---------+-----------+----------+-------+ Ulnar         Full                                          +----------+------------+---------+-----------+----------+-------+ Cephalic      Full                                          +----------+------------+---------+-----------+----------+-------+ Basilic       Full                                          +----------+------------+---------+-----------+----------+-------+  Summary:  Right: No evidence of deep vein thrombosis in the upper extremity. No evidence of superficial vein thrombosis in the upper extremity.  Left: No evidence of deep vein thrombosis in the upper extremity. No evidence of superficial vein thrombosis in the upper extremity.  *See table(s) above for measurements and observations.  Diagnosing physician: Deitra Mayo MD Electronically signed by Deitra Mayo MD on 06/18/2020 at 5:41:03 PM.    Final      .[name], M.D., F.A.C.S. Gastrointestinal and Minimally Invasive Surgery [facility] [address], [place], [place] [address] [phone]  Main / Paging  07/17/2020 7:15 AM    [name]

## 2020-07-17 NOTE — Anesthesia Procedure Notes (Signed)
Procedure Name: Intubation Date/Time: 07/17/2020 8:04 AM Performed by: Mitzie Na, CRNA Pre-anesthesia Checklist: Patient identified, Emergency Drugs available, Suction available and Patient being monitored Patient Re-evaluated:Patient Re-evaluated prior to induction Oxygen Delivery Method: Circle system utilized Preoxygenation: Pre-oxygenation with 100% oxygen Induction Type: IV induction Ventilation: Mask ventilation without difficulty and Oral airway inserted - appropriate to patient size Laryngoscope Size: Mac and 3 Grade View: Grade I Tube type: Oral Tube size: 7.5 mm Number of attempts: 1 Airway Equipment and Method: Stylet and Oral airway Placement Confirmation: ETT inserted through vocal cords under direct vision,  positive ETCO2 and breath sounds checked- equal and bilateral Secured at: 23 cm Tube secured with: Tape Dental Injury: Teeth and Oropharynx as per pre-operative assessment

## 2020-07-18 ENCOUNTER — Encounter (HOSPITAL_COMMUNITY): Payer: Self-pay | Admitting: Surgery

## 2020-07-18 ENCOUNTER — Inpatient Hospital Stay (HOSPITAL_COMMUNITY): Payer: Medicare Other

## 2020-07-18 DIAGNOSIS — Z79899 Other long term (current) drug therapy: Secondary | ICD-10-CM

## 2020-07-18 DIAGNOSIS — D638 Anemia in other chronic diseases classified elsewhere: Secondary | ICD-10-CM

## 2020-07-18 DIAGNOSIS — D84821 Immunodeficiency due to drugs: Secondary | ICD-10-CM

## 2020-07-18 DIAGNOSIS — N182 Chronic kidney disease, stage 2 (mild): Secondary | ICD-10-CM

## 2020-07-18 LAB — BASIC METABOLIC PANEL
Anion gap: 6 (ref 5–15)
BUN: 20 mg/dL (ref 6–20)
CO2: 26 mmol/L (ref 22–32)
Calcium: 10.7 mg/dL — ABNORMAL HIGH (ref 8.9–10.3)
Chloride: 104 mmol/L (ref 98–111)
Creatinine, Ser: 1.7 mg/dL — ABNORMAL HIGH (ref 0.44–1.00)
GFR, Estimated: 34 mL/min — ABNORMAL LOW (ref >60)
Glucose, Bld: 124 mg/dL — ABNORMAL HIGH (ref 70–99)
Potassium: 4.6 mmol/L (ref 3.5–5.1)
Sodium: 136 mmol/L (ref 135–145)

## 2020-07-18 LAB — CBC
HCT: 26.7 % — ABNORMAL LOW (ref 36.0–46.0)
Hemoglobin: 8.5 g/dL — ABNORMAL LOW (ref 12.0–15.0)
MCH: 33.5 pg (ref 26.0–34.0)
MCHC: 31.8 g/dL (ref 30.0–36.0)
MCV: 105.1 fL — ABNORMAL HIGH (ref 80.0–100.0)
Platelets: 224 10*3/uL (ref 150–400)
RBC: 2.54 MIL/uL — ABNORMAL LOW (ref 3.87–5.11)
RDW: 13.7 % (ref 11.5–15.5)
WBC: 15 10*3/uL — ABNORMAL HIGH (ref 4.0–10.5)
nRBC: 0 % (ref 0.0–0.2)

## 2020-07-18 LAB — RETICULOCYTES
Immature Retic Fract: 22.2 % — ABNORMAL HIGH (ref 2.3–15.9)
RBC.: 2.51 MIL/uL — ABNORMAL LOW (ref 3.87–5.11)
Retic Count, Absolute: 77.8 10*3/uL (ref 19.0–186.0)
Retic Ct Pct: 3.1 % (ref 0.4–3.1)

## 2020-07-18 LAB — IRON AND TIBC
Iron: 12 ug/dL — ABNORMAL LOW (ref 28–170)
Saturation Ratios: 5 % — ABNORMAL LOW (ref 10.4–31.8)
TIBC: 225 ug/dL — ABNORMAL LOW (ref 250–450)
UIBC: 213 ug/dL

## 2020-07-18 LAB — MAGNESIUM: Magnesium: 1.7 mg/dL (ref 1.7–2.4)

## 2020-07-18 LAB — FOLATE: Folate: 9.3 ng/mL (ref >5.9)

## 2020-07-18 LAB — VITAMIN B12: Vitamin B-12: 1723 pg/mL — ABNORMAL HIGH (ref 180–914)

## 2020-07-18 LAB — PHOSPHORUS: Phosphorus: 3.1 mg/dL (ref 2.5–4.6)

## 2020-07-18 LAB — FERRITIN: Ferritin: 84 ng/mL (ref 11–307)

## 2020-07-18 LAB — PREALBUMIN: Prealbumin: 11.4 mg/dL — ABNORMAL LOW (ref 18–38)

## 2020-07-18 MED ORDER — SODIUM CHLORIDE 0.9% FLUSH
10.0000 mL | Freq: Two times a day (BID) | INTRAVENOUS | Status: DC
  Administered 2020-07-18 – 2020-07-22 (×6): 10 mL

## 2020-07-18 MED ORDER — CHLORHEXIDINE GLUCONATE CLOTH 2 % EX PADS
6.0000 | MEDICATED_PAD | Freq: Every day | CUTANEOUS | Status: DC
  Administered 2020-07-18 – 2020-07-23 (×6): 6 via TOPICAL

## 2020-07-18 MED ORDER — SODIUM CHLORIDE 0.9% FLUSH
3.0000 mL | Freq: Two times a day (BID) | INTRAVENOUS | Status: DC
  Administered 2020-07-18: 3 mL via INTRAVENOUS

## 2020-07-18 MED ORDER — SODIUM CHLORIDE 0.9 % IV SOLN
25.0000 mg | Freq: Once | INTRAVENOUS | Status: AC
  Administered 2020-07-18: 25 mg via INTRAVENOUS
  Filled 2020-07-18: qty 0.5

## 2020-07-18 MED ORDER — SODIUM CHLORIDE 0.9 % IV SOLN: 250.0000 mL | INTRAVENOUS | Status: DC | PRN

## 2020-07-18 MED ORDER — SODIUM CHLORIDE 0.9 % IV SOLN
500.0000 mg | Freq: Once | INTRAVENOUS | Status: AC
  Administered 2020-07-18: 500 mg via INTRAVENOUS
  Filled 2020-07-18: qty 10

## 2020-07-18 MED ORDER — SODIUM CHLORIDE 0.9% FLUSH: 10.0000 mL | INTRAVENOUS | Status: DC | PRN

## 2020-07-18 MED ORDER — SODIUM CHLORIDE 0.9% FLUSH: 3.0000 mL | INTRAVENOUS | Status: DC | PRN

## 2020-07-18 MED ORDER — LACTATED RINGERS IV BOLUS: 1000.0000 mL | Freq: Three times a day (TID) | INTRAVENOUS | Status: DC | PRN

## 2020-07-18 NOTE — Progress Notes (Signed)
PT Cancellation Note  Patient Details Name: Mushka Laconte MRN: [id number] DOB: 09-06-1960   Cancelled Treatment:    Reason Eval/Treat Not Completed: Fatigue/lethargy limiting ability to participate (pt sleeping soundly, pt's daughter requested PT hold for today and attempt tomorrow as pt is very fatigued. Will follow.)  Philomena Doheny PT 07/18/2020  Acute Rehabilitation Services Pager [phone] Office [phone]

## 2020-07-18 NOTE — Evaluation (Signed)
Occupational Therapy Evaluation Patient Details Name: Valerie [name] MRN: [id number] DOB: 1961/03/27 Today's Date: 07/18/2020    History of Present Illness 59 year old female who presents with colovesical fistula, s/p rectosigmoid resection and fistula repair 07/17/20. PMH includes bipolar disorder, CAD, CKD, psoriatic arthritis, NSTEMI, B THA. Recent admission 10/11-10/21/21 for colovesical fistula and frequent falls.   Clinical Impression   Patient lives with roommate and stays on main level of home. Per patient/DTR since July has been limited physically spending a lot of time in bed or recliner "she walked out to her patio once." Patient has supervision from family or roommate for mobility with walker and intermittent assist with ADLs. Currently patient requiring max A for bed mobility, mod A x1-2 to power up to standing with decreased stability and LE buckling. Required mod A to side step to [facility] and for eccentric control onto bed. Patient also having R UE weakness/jerking with difficulty maintaining grasp on walker. DTR reporting patient not fully back to baseline for mentation post surgery. Recommend continued acute OT services in order to maximize safety and independence with self care in order to facilitate D/C to venue listed below.    Follow Up Recommendations  SNF;Supervision/Assistance - 24 hour    Equipment Recommendations  None recommended by OT       Precautions / Restrictions Precautions Precautions: Fall Precaution Comments: drain Restrictions Weight Bearing Restrictions: No      Mobility Bed Mobility Overal bed mobility: Needs Assistance Bed Mobility: Rolling;Sidelying to Sit;Sit to Sidelying Rolling: Max assist Sidelying to sit: HOB elevated;Max assist     Sit to sidelying: Mod assist;HOB elevated General bed mobility comments: cues to sequence log roll, max A for LE and trunk management to EOB. mod A to lift LEs onto bed    Transfers Overall transfer level:  Needs assistance Equipment used: Rolling walker (2 wheeled) Transfers: Sit to/from Stand Sit to Stand: Mod assist         General transfer comment: patient tremulous at EOB, having difficulty maintaining grasp on walker with R UE, please see toilet transfer in ADL section    Balance Overall balance assessment: Needs assistance Sitting-balance support: Feet supported;Bilateral upper extremity supported Sitting balance-Leahy Scale: Poor     Standing balance support: Bilateral upper extremity supported Standing balance-Leahy Scale: Poor Standing balance comment: needs external support from RW and mod A                           ADL either performed or assessed with clinical judgement   ADL Overall ADL's : Needs assistance/impaired     Grooming: Wash/dry face;Set up;Bed level   Upper Body Bathing: Moderate assistance;Sitting   Lower Body Bathing: Total assistance;Bed level;Sitting/lateral leans   Upper Body Dressing : Moderate assistance;Sitting   Lower Body Dressing: Total assistance;Sit to/from stand;Bed level   Toilet Transfer: Moderate assistance;Cueing for safety;Cueing for sequencing;RW Toilet Transfer Details (indicate cue type and reason): side step to HOB, LE buckling and small shuffled steps, assist for eccentric control onto EOB Toileting- Clothing Manipulation and Hygiene: Total assistance       Functional mobility during ADLs: Moderate assistance;Cueing for safety;Cueing for sequencing;Rolling walker General ADL Comments: patient requiring increased assistance for self care due to decreased activity tolerance, balance, safety awareness, strength and pain                   Pertinent Vitals/Pain Pain Assessment: Faces Pain Score: 5  Faces Pain Scale:  Hurts even more Pain Location: abdomen with mobility Pain Descriptors / Indicators: Grimacing;Guarding Pain Intervention(s): Monitored during session     Hand Dominance Right    Extremity/Trunk Assessment Upper Extremity Assessment Upper Extremity Assessment: Generalized weakness   Lower Extremity Assessment Lower Extremity Assessment: Defer to PT evaluation   Cervical / Trunk Assessment Cervical / Trunk Assessment: Other exceptions Cervical / Trunk Exceptions: body habitus   Communication Communication Communication: No difficulties   Cognition Arousal/Alertness: Awake/alert;Lethargic;Suspect due to medications Behavior During Therapy: [facility] for tasks assessed/performed Overall Cognitive Status: Impaired/Different from baseline Area of Impairment: Attention;Following commands                   Current Attention Level: Sustained   Following Commands: Follows one step commands with increased time       General Comments: DTR reports patient still groggy not quite back to her normal, patient initially lethargic, improved with mobility               Home Living Family/patient expects to be discharged to:: Private residence Living Arrangements: Non-relatives/Friends (roommate ) Available Help at Discharge: Family;Available 24 hours/day Type of Home: House Home Access: Stairs to enter [employer] of Steps: 1   Home Layout: Two level;Able to live on main level with bedroom/bathroom Alternate Level Stairs-Number of Steps: 1 flight   Bathroom Shower/Tub: Other (comment) (sponge bath)   Bathroom Toilet: Handicapped height     Home Equipment: Walker - 2 wheels;Cane - single point;Wheelchair - manual;Bedside commode;Grab bars - toilet          Prior Functioning/Environment Level of Independence: Needs assistance  Gait / Transfers Assistance Needed: supervision with ambulation + RW, uses w/c in community ADL's / Homemaking Assistance Needed: Needs occasional assist for ADL/IADL tasks.   Comments: DTR, patient's sister or roommate provides assist for ADL/IADLs        OT Problem List: Decreased strength;Decreased activity  tolerance;Impaired balance (sitting and/or standing);Decreased cognition;Decreased safety awareness;Obesity;Pain      OT Treatment/Interventions: Self-care/ADL training;Therapeutic exercise;Energy conservation;DME and/or AE instruction;Therapeutic activities;Patient/family education;Balance training;Cognitive remediation/compensation    OT Goals(Current goals can be found in the care plan section) Acute Rehab OT Goals Patient Stated Goal: go to rehab OT Goal Formulation: With patient/family Time For Goal Achievement: 08/01/20 Potential to Achieve Goals: Good  OT Frequency: Min 2X/week    AM-PAC OT "6 Clicks" Daily Activity     Outcome Measure Help from another person eating meals?: A Little Help from another person taking care of personal grooming?: A Little Help from another person toileting, which includes using toliet, bedpan, or urinal?: Total Help from another person bathing (including washing, rinsing, drying)?: A Lot Help from another person to put on and taking off regular upper body clothing?: A Lot Help from another person to put on and taking off regular lower body clothing?: Total 6 Click Score: 12   End of Session Equipment Utilized During Treatment: Rolling walker  Activity Tolerance: Patient limited by fatigue;Patient limited by pain Patient left: in bed;with call bell/phone within reach;with bed alarm set;with family/visitor present  OT Visit Diagnosis: Unsteadiness on feet (R26.81);Other abnormalities of gait and mobility (R26.89);Muscle weakness (generalized) (M62.81);Pain Pain - part of body:  (abdomen)                Time: 2197-5883 OT Time Calculation (min): 27 min Charges:  OT General Charges $OT Visit: 1 Visit OT Evaluation $OT Eval Moderate Complexity: 1 Mod OT Treatments $Self Care/Home Management : 8-22 mins  Delbert Phenix OT OT pager: [phone]  Rosemary Holms 07/18/2020, 12:03 PM

## 2020-07-18 NOTE — Progress Notes (Addendum)
Valerie [name] [id number] 12/10/1960  CARE TEAM:  PCP: Ophelia Shoulder, MD  Outpatient Care Team: Patient Care Team: Ophelia Shoulder, MD as PCP - General (Internal Medicine) [name], MD as PCP - Cardiology (Cardiology) Janith Lima, MD as Consulting Physician (Urology) Michael Boston, MD as Consulting Physician (Colon and Rectal Surgery) Carol Ada, MD as Consulting Physician (Gastroenterology) [name], MD as Consulting Physician (Cardiology) Tenna Child, RN as [place] Nurse  Inpatient Treatment Team: Treatment Team: Attending Provider: Michael Boston, MD; [place] Nurse: Tenna Child, RN; Technician: Ernest Mallick, NT; Registered Nurse: Levonne Spiller, RN; Registered Nurse: Baltazar Najjar, RN; Technician: Leda Quail, NT; Physical Therapist: Lucile Crater, PT; Utilization Review: Fortino Sic, RN   Problem List:   Principal Problem:   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 Active Problems:   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   Anxiety   Obesity, Class III, BMI 40-49.9 (morbid obesity) ([place])   CKD (chronic kidney disease), stage III (HCC)   Psoriatic arthritis ([place])   OSA (obstructive sleep apnea)   Bipolar disorder ([place])   DM type 2 (diabetes mellitus, type 2) ([place])   CAD (coronary artery disease)   CKD (chronic kidney disease) stage 2, GFR 60-89 ml/min   Anemia, chronic disease   1 Day Post-Op    07/17/2020  POST-OPERATIVE DIAGNOSIS:  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 Diverticulitis with colovesical  fistula  Postoperative diagnosis:  1. Diverticulitis with colovesical fistula  Procedure: 1. Cystoscopy 2. Bilateral ureteral cannulation and injection of indocyanine green dye  Surgeon: Pryor Curia. M.D.  Assessment  Recovering  [facility] Stay = 1 days)  Plan:  -Advance diet per protocol  Pain control  Wean off IV fluids per protocol.  Low threshold to give IV boluses to catch up if needed.  Keep on dry side as possible while watching creatinine closely  Follow-up on pathology  Foley catheter for at least 7 days and then cystogram on postop day 7.  Low threshold to carry further out or have urology reinvolved if delayed healing.  She is high risk for this given her chronic immunosuppression and large fistula requiring 2 layer closure and omentopexy  Acute on chronic anemia.  IV iron.  Check anemia panel since she seems to be macrocytic to rule out any folate and vitamin B12 or other deficiencies  Keep on low-dose steroids for now to minimize anastomotic healing issues while the same time preventing debilitating psoriatic arthritis.  Pain control.  Hypercholesterolemia controlled  GERD control  CPAP for sleep apnea  Borderline diabetes.  Not hyperglycemic.  Hold off on sliding scale for now but follow-up  -VTE prophylaxis- SCDs, etc  -mobilize as tolerated to help recovery.  Physical therapy and Occupational Therapy evaluations.  See if she can do home health versus needing skilled facility given her medical complexity and deconditioned state  30 minutes spent in review, evaluation, examination, counseling, and coordination of care.  More than 50% of that time was spent in counseling.  07/18/2020    Subjective: (Chief complaint)  Patient woke up very sore.  Medicines given.  Little groggy.  Nurse and daughter at bedside.  Tolerating clears.  No nausea.    Objective:  Vital signs:  Vitals:   07/17/20 2009 07/17/20 2125 07/18/20 0500  07/18/20 0512  BP: (!) 112/53   (!) 141/113  Pulse: 85 88  88  Resp: 18 16  18   Temp: 98.2 F (36.8 C)   98.7 F (37.1 C)  TempSrc:      SpO2: 99% 91%  (!) 86%  Weight:   102.8 kg   Height:        Last BM Date: 07/17/20  Intake/Output   Yesterday:  11/24 0701 - 11/25 0700 In: 1739 [P.O.:60; I.V.:1579; IV Piggyback:100] Out: 1550 [Urine:1100; Drains:350; Blood:100] This shift:  No intake/output data recorded.  Bowel function:  Flatus: No  BM:  No  Drain: Serosanguinous   Physical Exam:  General: Pt awake/alert in no acute distress.  Initially groggy but wakens up.  Tired but not toxic nor sickly Eyes: PERRL, normal EOM.  Sclera clear.  No icterus Neuro: CN II-XII intact w/o focal sensory/motor deficits. Lymph: No head/neck/groin lymphadenopathy Psych:  No delerium/psychosis/paranoia.  Oriented x 4 HENT: Normocephalic, Mucus membranes moist.  No thrush Neck: Supple, No tracheal deviation.  No obvious thyromegaly Chest: No pain to chest wall compression.  Good respiratory excursion.  No audible wheezing CV:  Pulses intact.  Regular rhythm.  No major extremity edema MS: Normal AROM mjr joints.  No obvious deformity  Abdomen: Soft.  Nondistended.  Mildly tender at incisions only.  No evidence of peritonitis.  No incarcerated hernias.  Ext:  No deformity.  No mjr edema.  No cyanosis Skin: No petechiae / purpurea.  No major sores.  Warm and dry    Results:   Cultures: Recent Results (from the past 720 hour(s))  SARS CORONAVIRUS 2 (TAT 6-24 HRS) Nasopharyngeal Nasopharyngeal Swab     Status: None   Collection Time: 07/13/20 10:40 AM   Specimen: Nasopharyngeal Swab  Result Value Ref Range Status   SARS Coronavirus 2 NEGATIVE NEGATIVE Final    Comment: (NOTE) SARS-CoV-2 target nucleic acids are NOT DETECTED.  The SARS-CoV-2 RNA is generally detectable in upper and lower respiratory specimens during the acute phase of infection. Negative results do not preclude  SARS-CoV-2 infection, do not rule out co-infections with other pathogens, and should not be used as the sole basis for treatment or other patient management decisions. Negative results must be combined with clinical observations, patient history, and epidemiological information. The expected result is Negative.  Fact Sheet for Patients: SugarRoll.be  Fact Sheet for Healthcare Providers: https://www.woods-mathews.com/  This test is not yet approved or cleared by the [place] FDA and  has been authorized for detection and/or diagnosis of SARS-CoV-2 by FDA under an Emergency Use Authorization (EUA). This EUA will remain  in effect (meaning this test can be used) for the duration of the COVID-19 declaration under Se ction 564(b)(1) of the Act, 21 U.S.C. section 360bbb-3(b)(1), unless the authorization is terminated or revoked sooner.  Performed at [facility], [place] [address] [place], [place] [address]     Labs: Results for orders placed or performed during the hospital encounter of 07/17/20 (from the past 48 hour(s))  Glucose, capillary     Status: None   Collection Time: 07/17/20  5:57 AM  Result Value Ref Range   Glucose-Capillary 96 70 - 99 mg/dL    Comment: Glucose reference range applies only to samples taken after fasting for at least 8 hours.  ABO/Rh     Status: None  Collection Time: 07/17/20  6:45 AM  Result Value Ref Range   ABO/RH(D)      AB POS Performed at [facility], [place] [address], [place], [place] [address]   CBC with Differential/Platelet     Status: Abnormal   Collection Time: 07/17/20  6:45 AM  Result Value Ref Range   WBC 7.4 4.0 - 10.5 K/uL   RBC 3.03 (L) 3.87 - 5.11 MIL/uL   Hemoglobin 9.7 (L) 12.0 - 15.0 g/dL   HCT 31.7 (L) 36 - 46 %   MCV 104.6 (H) 80.0 - 100.0 fL   MCH 32.0 26.0 - 34.0 pg   MCHC 30.6 30.0 - 36.0 g/dL   RDW 13.8 11.5 - 15.5 %   Platelets 218 150 - 400 K/uL    nRBC 0.0 0.0 - 0.2 %   Neutrophils Relative % 71 %   Neutro Abs 5.3 1.7 - 7.7 K/uL   Lymphocytes Relative 13 %   Lymphs Abs 0.9 0.7 - 4.0 K/uL   Monocytes Relative 11 %   Monocytes Absolute 0.8 0.1 - 1.0 K/uL   Eosinophils Relative 4 %   Eosinophils Absolute 0.3 0.0 - 0.5 K/uL   Basophils Relative 1 %   Basophils Absolute 0.1 0.0 - 0.1 K/uL   Immature Granulocytes 0 %   Abs Immature Granulocytes 0.02 0.00 - 0.07 K/uL    Comment: Performed at [facility], [place] [address] [place], [place] [address]  Comprehensive metabolic panel     Status: Abnormal   Collection Time: 07/17/20  6:45 AM  Result Value Ref Range   Sodium 137 135 - 145 mmol/L   Potassium 3.9 3.5 - 5.1 mmol/L   Chloride 102 98 - 111 mmol/L   CO2 28 22 - 32 mmol/L   Glucose, Bld 109 (H) 70 - 99 mg/dL    Comment: Glucose reference range applies only to samples taken after fasting for at least 8 hours.   BUN 20 6 - 20 mg/dL   Creatinine, Ser 1.64 (H) 0.44 - 1.00 mg/dL   Calcium 11.3 (H) 8.9 - 10.3 mg/dL   Total Protein 6.0 (L) 6.5 - 8.1 g/dL   Albumin 3.1 (L) 3.5 - 5.0 g/dL   AST 18 15 - 41 U/L   ALT 12 0 - 44 U/L   Alkaline Phosphatase 73 38 - 126 U/L   Total Bilirubin 0.4 0.3 - 1.2 mg/dL   GFR, Estimated 36 (L) >60 mL/min    Comment: (NOTE) Calculated using the CKD-EPI Creatinine Equation (2021)    Anion gap 7 5 - 15    Comment: Performed at [facility], [place] [address], [place], [place] [address]  Basic metabolic panel     Status: Abnormal   Collection Time: 07/18/20  4:49 AM  Result Value Ref Range   Sodium 136 135 - 145 mmol/L   Potassium 4.6 3.5 - 5.1 mmol/L   Chloride 104 98 - 111 mmol/L   CO2 26 22 - 32 mmol/L   Glucose, Bld 124 (H) 70 - 99 mg/dL    Comment: Glucose reference range applies only to samples taken after fasting for at least 8 hours.   BUN 20 6 - 20 mg/dL   Creatinine, Ser 1.70 (H) 0.44 - 1.00 mg/dL   Calcium 10.7 (H) 8.9 - 10.3 mg/dL   GFR,  Estimated 34 (L) >60 mL/min    Comment: (NOTE) Calculated using the CKD-EPI Creatinine Equation (2021)    Anion gap 6  5 - 15    Comment: Performed at [facility], [place] [address], [place], [place] [address]  CBC     Status: Abnormal   Collection Time: 07/18/20  4:49 AM  Result Value Ref Range   WBC 15.0 (H) 4.0 - 10.5 K/uL   RBC 2.54 (L) 3.87 - 5.11 MIL/uL   Hemoglobin 8.5 (L) 12.0 - 15.0 g/dL   HCT 26.7 (L) 36 - 46 %   MCV 105.1 (H) 80.0 - 100.0 fL   MCH 33.5 26.0 - 34.0 pg   MCHC 31.8 30.0 - 36.0 g/dL   RDW 13.7 11.5 - 15.5 %   Platelets 224 150 - 400 K/uL   nRBC 0.0 0.0 - 0.2 %    Comment: Performed at [facility], [place] [address] [place], [place] [address]  Magnesium     Status: None   Collection Time: 07/18/20  4:49 AM  Result Value Ref Range   Magnesium 1.7 1.7 - 2.4 mg/dL    Comment: Performed at [facility], [place] [address] [place], [place] [address]    Imaging / Studies: No results found.  Medications / Allergies: per chart  Antibiotics: Anti-infectives (From admission, onward)   Start     Dose/Rate Route Frequency Ordered Stop   07/17/20 2000  cefoTEtan (CEFOTAN) 2 g in sodium chloride 0.9 % 100 mL IVPB        2 g 200 mL/hr over 30 Minutes Intravenous Every 12 hours 07/17/20 1615 07/17/20 2359   07/17/20 1400  neomycin (MYCIFRADIN) tablet 1,000 mg  Status:  Discontinued       "And" Linked Group Details   1,000 mg Oral 3 times per day 07/17/20 0603 07/17/20 0609   07/17/20 1400  metroNIDAZOLE (FLAGYL) tablet 1,000 mg  Status:  Discontinued       "And" Linked Group Details   1,000 mg Oral 3 times per day 07/17/20 0603 07/17/20 0609   07/17/20 0615  cefoTEtan (CEFOTAN) 2 g in sodium chloride 0.9 % 100 mL IVPB        2 g 200 mL/hr over 30 Minutes Intravenous On call to O.R. 07/17/20 0603 07/17/20 4287        Note: Portions of this report may have been transcribed using voice recognition software.  Every effort was made to ensure accuracy; however, inadvertent computerized transcription errors may be present.   Any transcriptional errors that result from this process are unintentional.    [name], MD, FACS, MASCRS Gastrointestinal and Minimally Invasive Surgery  [facility] [address] [place], [place] [address] [phone] Fax [phone] Main/Paging  CONTACT INFORMATION: Weekday (9AM-5PM) concerns: Call [facility] main office at [phone] Weeknight (5PM-9AM) or Weekend/Holiday concerns: Check [email or web address] for General Surgery [facility] coverage (Please, do not use SecureChat as it is not reliable communication to operating surgeons for immediate patient care)      07/18/2020  8:09 AM

## 2020-07-19 ENCOUNTER — Inpatient Hospital Stay (HOSPITAL_COMMUNITY): Payer: Medicare Other

## 2020-07-19 DIAGNOSIS — I2510 Atherosclerotic heart disease of native coronary artery without angina pectoris: Secondary | ICD-10-CM

## 2020-07-19 DIAGNOSIS — E1140 Type 2 diabetes mellitus with diabetic neuropathy, unspecified: Secondary | ICD-10-CM

## 2020-07-19 DIAGNOSIS — N321 Vesicointestinal fistula: Secondary | ICD-10-CM

## 2020-07-19 DIAGNOSIS — L4050 Arthropathic psoriasis, unspecified: Secondary | ICD-10-CM

## 2020-07-19 DIAGNOSIS — R4182 Altered mental status, unspecified: Secondary | ICD-10-CM

## 2020-07-19 DIAGNOSIS — F419 Anxiety disorder, unspecified: Secondary | ICD-10-CM

## 2020-07-19 DIAGNOSIS — G4733 Obstructive sleep apnea (adult) (pediatric): Secondary | ICD-10-CM

## 2020-07-19 DIAGNOSIS — R400 Somnolence: Secondary | ICD-10-CM

## 2020-07-19 DIAGNOSIS — D638 Anemia in other chronic diseases classified elsewhere: Secondary | ICD-10-CM

## 2020-07-19 DIAGNOSIS — N1832 Chronic kidney disease, stage 3b: Secondary | ICD-10-CM

## 2020-07-19 DIAGNOSIS — F319 Bipolar disorder, unspecified: Secondary | ICD-10-CM

## 2020-07-19 DIAGNOSIS — E440 Moderate protein-calorie malnutrition: Secondary | ICD-10-CM

## 2020-07-19 DIAGNOSIS — J9601 Acute respiratory failure with hypoxia: Secondary | ICD-10-CM

## 2020-07-19 LAB — BASIC METABOLIC PANEL
Anion gap: 6 (ref 5–15)
BUN: 22 mg/dL — ABNORMAL HIGH (ref 6–20)
CO2: 27 mmol/L (ref 22–32)
Calcium: 11.6 mg/dL — ABNORMAL HIGH (ref 8.9–10.3)
Chloride: 108 mmol/L (ref 98–111)
Creatinine, Ser: 1.56 mg/dL — ABNORMAL HIGH (ref 0.44–1.00)
GFR, Estimated: 38 mL/min — ABNORMAL LOW (ref >60)
Glucose, Bld: 89 mg/dL (ref 70–99)
Potassium: 4.4 mmol/L (ref 3.5–5.1)
Sodium: 141 mmol/L (ref 135–145)

## 2020-07-19 LAB — POTASSIUM: Potassium: 4.2 mmol/L (ref 3.5–5.1)

## 2020-07-19 LAB — BLOOD GAS, ARTERIAL
Acid-Base Excess: 3.4 mmol/L — ABNORMAL HIGH (ref 0.0–2.0)
Bicarbonate: 28.6 mmol/L — ABNORMAL HIGH (ref 20.0–28.0)
Drawn by: 308601
FIO2: 40
O2 Content: 5 L/min
O2 Saturation: 93.7 %
Patient temperature: 98.6
pCO2 arterial: 47.8 mmHg (ref 32.0–48.0)
pH, Arterial: 7.395 (ref 7.350–7.450)
pO2, Arterial: 69.8 mmHg — ABNORMAL LOW (ref 83.0–108.0)

## 2020-07-19 LAB — CREATININE, SERUM
Creatinine, Ser: 1.5 mg/dL — ABNORMAL HIGH (ref 0.44–1.00)
GFR, Estimated: 40 mL/min — ABNORMAL LOW (ref >60)

## 2020-07-19 LAB — CBC
HCT: 27.8 % — ABNORMAL LOW (ref 36.0–46.0)
Hemoglobin: 8.6 g/dL — ABNORMAL LOW (ref 12.0–15.0)
MCH: 33.1 pg (ref 26.0–34.0)
MCHC: 30.9 g/dL (ref 30.0–36.0)
MCV: 106.9 fL — ABNORMAL HIGH (ref 80.0–100.0)
Platelets: 198 10*3/uL (ref 150–400)
RBC: 2.6 MIL/uL — ABNORMAL LOW (ref 3.87–5.11)
RDW: 13.5 % (ref 11.5–15.5)
WBC: 14.2 10*3/uL — ABNORMAL HIGH (ref 4.0–10.5)
nRBC: 0 % (ref 0.0–0.2)

## 2020-07-19 LAB — AMMONIA: Ammonia: 28 umol/L (ref 9–35)

## 2020-07-19 LAB — COMPREHENSIVE METABOLIC PANEL
ALT: 14 U/L (ref 0–44)
AST: 17 U/L (ref 15–41)
Albumin: 2.8 g/dL — ABNORMAL LOW (ref 3.5–5.0)
Alkaline Phosphatase: 65 U/L (ref 38–126)
Anion gap: 7 (ref 5–15)
BUN: 21 mg/dL — ABNORMAL HIGH (ref 6–20)
CO2: 27 mmol/L (ref 22–32)
Calcium: 11.7 mg/dL — ABNORMAL HIGH (ref 8.9–10.3)
Chloride: 104 mmol/L (ref 98–111)
Creatinine, Ser: 1.56 mg/dL — ABNORMAL HIGH (ref 0.44–1.00)
GFR, Estimated: 38 mL/min — ABNORMAL LOW (ref >60)
Glucose, Bld: 97 mg/dL (ref 70–99)
Potassium: 4.1 mmol/L (ref 3.5–5.1)
Sodium: 138 mmol/L (ref 135–145)
Total Bilirubin: 0.9 mg/dL (ref 0.3–1.2)
Total Protein: 6.1 g/dL — ABNORMAL LOW (ref 6.5–8.1)

## 2020-07-19 LAB — GLUCOSE, CAPILLARY
Glucose-Capillary: 98 mg/dL (ref 70–99)
Glucose-Capillary: 103 mg/dL — ABNORMAL HIGH (ref 70–99)
Glucose-Capillary: 97 mg/dL (ref 70–99)

## 2020-07-19 LAB — TSH: TSH: 1.301 u[IU]/mL (ref 0.350–4.500)

## 2020-07-19 MED ORDER — ALBUTEROL SULFATE (2.5 MG/3ML) 0.083% IN NEBU
2.5000 mg | INHALATION_SOLUTION | Freq: Once | RESPIRATORY_TRACT | Status: AC
  Administered 2020-07-19: 2.5 mg via RESPIRATORY_TRACT
  Filled 2020-07-19: qty 3

## 2020-07-19 MED ORDER — FENTANYL CITRATE (PF) 100 MCG/2ML IJ SOLN: 25.0000 ug | INTRAMUSCULAR | Status: DC | PRN

## 2020-07-19 MED ORDER — ALTEPLASE 2 MG IJ SOLR
2.0000 mg | Freq: Once | INTRAMUSCULAR | Status: AC
  Administered 2020-07-19: 2 mg
  Filled 2020-07-19: qty 2

## 2020-07-19 MED ORDER — FUROSEMIDE 10 MG/ML IJ SOLN
40.0000 mg | Freq: Once | INTRAMUSCULAR | Status: AC
  Administered 2020-07-19: 40 mg via INTRAVENOUS
  Filled 2020-07-19: qty 4

## 2020-07-19 MED ORDER — INSULIN ASPART 100 UNIT/ML ~~LOC~~ SOLN
0.0000 [IU] | Freq: Three times a day (TID) | SUBCUTANEOUS | Status: DC
  Administered 2020-07-20: 2 [IU] via SUBCUTANEOUS

## 2020-07-19 MED ORDER — INSULIN ASPART 100 UNIT/ML ~~LOC~~ SOLN: 0.0000 [IU] | Freq: Every day | SUBCUTANEOUS | Status: DC

## 2020-07-19 MED ORDER — ASCORBIC ACID 500 MG PO TABS
500.0000 mg | ORAL_TABLET | Freq: Every day | ORAL | Status: DC
  Administered 2020-07-19 – 2020-07-23 (×5): 500 mg via ORAL
  Filled 2020-07-19 (×5): qty 1

## 2020-07-19 NOTE — Consult Note (Signed)
Neurology Consultation  CC: My speech is slurred and I am so tired  Neuro asked to see due to continued somulence after surgery.   History is obtained from: patient and the chart  HPI: Stephany Poorman is a 59 y.o. female with a PMHX of obesity, OSA, morbid obesity, bipolar disorder, ? 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 (cystocopy with stent, robotic low anterior rectosigmoid resection and colovesical fistula repair.   Surgery consulted hospitalists and hospitalist asked for neuro help today with request to consult. Per chart, pt has had hypoxia several times since surgery. She pulled her CPAP off so now she is on O2 ATC. ABG showed normal pH, pCO2 47, pO2 70. Her SaO2 drops when lying back and improves with sitting up. CXR showed pleural effusions and atelectasis. Lasix was given. Her sugars have been in the 90s. On Lamictal and Lexapro for hx of psych issues. Medicine ordered MRI, CMP, close CBG monitoring, O2 ATC, and continuous oximetry.   Per chart and patient, she has been "extra sleepy" since surgery 2 days ago. Her main concern is for stroke. She states that her words are slurred and are usually not.   Per RN, her movements and activity has been slowed but seemed to improve some today. Her last Norco was 0100 and she has not received any Benzos today.     ROS: A complete ROS was not performed due to pt's sleepiness.    Past Medical History:  Diagnosis Date   Anemia    Anxiety    Arthritis    Psoriatic   Bipolar disorder ([place])    CKD (chronic kidney disease), stage III (HCC)    Colovesical fistula    Depression    DM type 2 (diabetes mellitus, type 2) ([place]) 11/14/2019   Dyspnea    Headache    Neuropathy    NSTEMI (non-ST elevated myocardial infarction) ([place]) 11/14/2019   OSA (obstructive sleep apnea)     Psoriatic arthritis ([place])    Sleep apnea    Stroke [facility])    Memory issues, March 2021     Family History  Problem Relation Age of Onset   Breast cancer Neg Hx      Social History:  reports that she has been smoking. She has been smoking about 0.50 packs per day. She has never used smokeless tobacco. She reports previous alcohol use. She reports that she does not use drugs.   Prior to Admission medications   Medication Sig Start Date End Date Taking? Authorizing Provider  ALPRAZolam Duanne Moron) 1 MG tablet Take 1 mg by mouth 3 (three) times daily as needed for anxiety (depression.).  11/21/19  Yes [provider]  Ascorbic Acid (VITAMIN C) 1000 MG tablet Take 1,000 mg by mouth at bedtime.   Yes [provider]  aspirin EC 81 MG tablet Take 1 tablet (81 mg total) by mouth daily. 11/14/19 11/13/20 Yes Samuella Cota, MD  b complex vitamins tablet Take 2 tablets by mouth at bedtime.   Yes [provider]  Cholecalciferol (VITAMIN D-3) 1000 units CAPS Take 1,000 Units by mouth daily.    Yes [provider]  clopidogrel (PLAVIX) 75 MG tablet Take 75 mg by mouth daily.   Yes [provider]  docusate sodium (COLACE) 100 MG capsule Take 100-200 mg by mouth at bedtime.   Yes [provider]  donepezil (ARICEPT) 10 MG tablet Take 10 mg by mouth daily. 10/30/19  Yes [provider]  escitalopram (LEXAPRO) 20 MG tablet Take 30 mg by mouth daily.  09/10/15  Yes [provider]  furosemide (LASIX) 20 MG tablet Take 20 mg by mouth every other day. 06/21/20  Yes [provider]  HYDROcodone-acetaminophen (NORCO) 10-325 MG tablet Take 1 tablet by mouth 2 (two) times daily as needed (pain).  07/31/19  Yes [provider]  lamoTRIgine (LAMICTAL) 150 MG tablet Take 150 mg by mouth 2 (two) times daily.  02/27/18  Yes [provider]  nadolol (CORGARD) 20 MG tablet Take 0.5 tablets (10 mg total) by mouth daily.  12/04/19  Yes [name], MD  nitroGLYCERIN (NITROSTAT) 0.4 MG SL tablet Place 1 tablet (0.4 mg total) under the tongue every 5 (five) minutes as needed for chest pain. 11/14/19  Yes Samuella Cota, MD  omeprazole (PRILOSEC) 20 MG capsule Take 1 capsule (20 mg total) by mouth 2 (two) times daily before a meal. 06/12/20 10/10/20 Yes Ghimire, Dante Gang, MD  ondansetron (ZOFRAN) 8 MG tablet Take 8 mg by mouth 2 (two) times daily as needed for nausea or vomiting.  05/02/20  Yes [provider]  predniSONE (DELTASONE) 10 MG tablet Take 5-10 mg by mouth See admin instructions. Take 5 mg by mouth every other day, alternating with 10 mg on opposing days   Yes [provider]  rosuvastatin (CRESTOR) 5 MG tablet Take 1 tablet (5 mg total) by mouth daily. 03/21/20 07/03/20 Yes [name], MD  traZODone (DESYREL) 100 MG tablet Take 200 mg by mouth at bedtime. 02/19/20  Yes [provider]  rizatriptan (MAXALT-MLT) 10 MG disintegrating tablet Take 10 mg by mouth See admin instructions. Take one tablet (10 mg) by mouth daily as needed for "floaters", May repeat in 2 hours if still needed    [provider]     Exam: Current vital signs: BP 139/68 (BP Location: Left Arm)    Pulse 85    Temp 98.9 F (37.2 C) (Oral)    Resp 20    Ht 5\' 2"  (1.575 m)    Wt 102.8 kg    SpO2 95%    BMI 41.45 kg/m    Physical Exam  Constitutional: Appears well-developed and well-nourished.  Psych: Affect appropriate to situation Eyes: No scleral injection HENT: No OP obstrucion Head: Normocephalic.  Cardiovascular: Normal rate and regular rhythm.  Respiratory: Effort normal, non-labored breathing GI: Soft.  Obese.  Skin: WDI  Neuro: Mental Status: Patient awakens to voice, but keeps her eyes closed throughout most of exam. She is oriented to person, place, month, year, and situation. Patient is able to give a clear and coherent history. No signs of aphasia or neglect but  exhibits dysarthria.  Cranial Nerves: II: Visual Fields are somewhat difficult to obtain because pt will not keep her eyes open. Pupils are equal, round, and reactive to light.   III,IV, VI: EOMI . Her OD eyelid is drooping more than Left. She does not know if this is chronic.  V: Facial sensation is symmetric to temperature VII: Facial movement is symmetric. No facial droop VIII: hearing is intact to voice X: Uvula elevates symmetrically XI: Shoulder shrug is symmetric. XII: tongue is midline without atrophy or fasciculations.  Motor: Tone is normal. Bulk is normal. 5/5 strength was present in all four extremities. Sensory: Sensation is symmetric to light touch to all extremities.  Deep Tendon Reflexes: 2+ and symmetric in the brachioradialis, biceps and patellae.  Plantars: Toes are downgoing bilaterally.  Cerebellar: FNF slightly ataxic but NP questions whether her exhibition is due to her sleepiness. Also, hx cerebellar stroke.   I have reviewed labs in epic and the pertinent results are:  Lab Results  Component Value Date/Time   CHOL 210 (H) 11/14/2019 04:58 PM    Results for orders placed or performed during the hospital encounter of 07/17/20 (from the past 48 hour(s))  Basic metabolic panel     Status: Abnormal   Collection Time: 07/18/20  4:49 AM  Result Value Ref Range   Sodium 136 135 - 145 mmol/L   Potassium 4.6 3.5 - 5.1 mmol/L   Chloride 104 98 - 111 mmol/L   CO2 26 22 - 32 mmol/L   Glucose, Bld 124 (H) 70 - 99 mg/dL    Comment: Glucose reference range applies only to samples taken after fasting for at least 8 hours.   BUN 20 6 - 20 mg/dL   Creatinine, Ser 1.70 (H) 0.44 - 1.00 mg/dL   Calcium 10.7 (H) 8.9 - 10.3 mg/dL   GFR, Estimated 34 (L) >60 mL/min    Comment: (NOTE) Calculated using the CKD-EPI Creatinine Equation (2021)    Anion gap 6 5 - 15    Comment: Performed at [facility], [place] [address], [place], [place] [address]  CBC      Status: Abnormal   Collection Time: 07/18/20  4:49 AM  Result Value Ref Range   WBC 15.0 (H) 4.0 - 10.5 K/uL   RBC 2.54 (L) 3.87 - 5.11 MIL/uL   Hemoglobin 8.5 (L) 12.0 - 15.0 g/dL   HCT 26.7 (L) 36 - 46 %   MCV 105.1 (H) 80.0 - 100.0 fL   MCH 33.5 26.0 - 34.0 pg   MCHC 31.8 30.0 - 36.0 g/dL   RDW 13.7 11.5 - 15.5 %   Platelets 224 150 - 400 K/uL   nRBC 0.0 0.0 - 0.2 %    Comment: Performed at [facility], [place] [address], [place], [place] [address]  Magnesium     Status: None   Collection Time: 07/18/20  4:49 AM  Result Value Ref Range   Magnesium 1.7 1.7 - 2.4 mg/dL    Comment: Performed at [facility], [place] [address] [place], [place] [address]  Vitamin B12     Status: Abnormal   Collection Time: 07/18/20  9:55 AM  Result Value Ref Range   Vitamin B-12 1,723 (H) 180 - 914 pg/mL    Comment: RESULTS CONFIRMED BY MANUAL DILUTION (NOTE) This assay is not validated for testing neonatal or myeloproliferative syndrome specimens for Vitamin B12 levels. Performed at [facility] [place] [address] [place], [place] [address]   Folate     Status: None   Collection Time: 07/18/20  9:55 AM  Result Value Ref Range   Folate 9.3 >5.9 ng/mL    Comment: Performed at [facility], [place] [address] [place], [place] [address]  Iron and TIBC     Status: Abnormal   Collection Time: 07/18/20  9:55 AM  Result Value Ref Range   Iron 12 (L) 28 - 170 ug/dL   TIBC 225 (L) 250 - 450 ug/dL   Saturation Ratios 5 (L) 10.4 - 31.8 %   UIBC 213 ug/dL    Comment: Performed at [facility], [place] [address] [place],  [place] [address]  Ferritin     Status: None   Collection Time: 07/18/20  9:55 AM  Result Value Ref Range   Ferritin 84 11 - 307 ng/mL    Comment: Performed at [facility], [place] [address] [place], [place] [address]  Reticulocytes     Status: Abnormal   Collection Time: 07/18/20   9:55 AM  Result Value Ref Range   Retic Ct Pct 3.1 0.4 - 3.1 %   RBC. 2.51 (L) 3.87 - 5.11 MIL/uL   Retic Count, Absolute 77.8 19.0 - 186.0 K/uL   Immature Retic Fract 22.2 (H) 2.3 - 15.9 %    Comment: Performed at [facility], [place] [address], [place], [place] [address]  Prealbumin     Status: Abnormal   Collection Time: 07/18/20  9:55 AM  Result Value Ref Range   Prealbumin 11.4 (L) 18 - 38 mg/dL    Comment: Performed at [facility], [place] [address] [place], [place] [address]  Phosphorus     Status: None   Collection Time: 07/18/20  9:55 AM  Result Value Ref Range   Phosphorus 3.1 2.5 - 4.6 mg/dL    Comment: Performed at [facility], [place] [address] [place], [place] [address]  Blood gas, arterial     Status: Abnormal   Collection Time: 07/19/20  5:25 AM  Result Value Ref Range   FIO2 40.00    O2 Content 5.0 L/min   Delivery systems NASAL CANNULA    pH, Arterial 7.395 7.35 - 7.45   pCO2 arterial 47.8 32 - 48 mmHg   pO2, Arterial 69.8 (L) 83 - 108 mmHg   Bicarbonate 28.6 (H) 20.0 - 28.0 mmol/L   Acid-Base Excess 3.4 (H) 0.0 - 2.0 mmol/L   O2 Saturation 93.7 %   Patient temperature 98.6    Collection site LEFT BRACHIAL    Drawn by [id number]     Comment: Performed at [place] [address], [place], [place] [address]  CBC     Status: Abnormal   Collection Time: 07/19/20  5:43 AM  Result Value Ref Range   WBC 14.2 (H) 4.0 - 10.5 K/uL   RBC 2.60 (L) 3.87 - 5.11 MIL/uL   Hemoglobin 8.6 (L) 12.0 - 15.0 g/dL   HCT 27.8 (L) 36 - 46 %   MCV 106.9 (H) 80.0 - 100.0 fL   MCH 33.1 26.0 - 34.0 pg   MCHC 30.9 30.0 - 36.0 g/dL   RDW 13.5 11.5 - 15.5 %   Platelets 198 150 - 400 K/uL   nRBC 0.0 0.0 - 0.2 %    Comment: Performed at [facility], [place] [address] [place], [place] [address]  Potassium     Status: None   Collection Time: 07/19/20  5:43 AM  Result Value Ref Range   Potassium  4.2 3.5 - 5.1 mmol/L    Comment: Performed at [facility], [place] [address], [place], [place] [address]  Creatinine, serum     Status: Abnormal   Collection Time: 07/19/20  5:43 AM  Result Value Ref Range   Creatinine, Ser 1.50 (H) 0.44 - 1.00 mg/dL   GFR, Estimated 40 (L) >60 mL/min    Comment: (NOTE) Calculated using the CKD-EPI Creatinine Equation (2021) Performed at [facility], [place] [address] [place],  [address]   Basic metabolic panel     Status: Abnormal   Collection Time: 07/19/20  5:43 AM  Result Value Ref Range   Sodium 141 135 - 145 mmol/L   Potassium 4.4 3.5 - 5.1 mmol/L   Chloride 108 98 - 111 mmol/L   CO2 27 22 - 32 mmol/L   Glucose, Bld 89 70 - 99 mg/dL    Comment: Glucose reference range applies only to samples taken after fasting for at least 8 hours.   BUN 22 (H) 6 - 20 mg/dL   Creatinine, Ser 1.56 (H) 0.44 - 1.00 mg/dL   Calcium 11.6 (H) 8.9 - 10.3 mg/dL   GFR, Estimated 38 (L) >60 mL/min    Comment: (NOTE) Calculated using the CKD-EPI Creatinine Equation (2021)    Anion gap 6 5 - 15    Comment: Performed at [facility], [place] [address] [place], [place] [address]  Comprehensive metabolic panel     Status: Abnormal   Collection Time: 07/19/20  9:06 AM  Result Value Ref Range   Sodium 138 135 - 145 mmol/L   Potassium 4.1 3.5 - 5.1 mmol/L   Chloride 104 98 - 111 mmol/L   CO2 27 22 - 32 mmol/L   Glucose, Bld 97 70 - 99 mg/dL    Comment: Glucose reference range applies only to samples taken after fasting for at least 8 hours.   BUN 21 (H) 6 - 20 mg/dL   Creatinine, Ser 1.56 (H) 0.44 - 1.00 mg/dL   Calcium 11.7 (H) 8.9 - 10.3 mg/dL   Total Protein 6.1 (L) 6.5 - 8.1 g/dL   Albumin 2.8 (L) 3.5 - 5.0 g/dL   AST 17 15 - 41 U/L   ALT 14 0 - 44 U/L   Alkaline Phosphatase 65 38 - 126 U/L   Total Bilirubin 0.9 0.3 - 1.2 mg/dL   GFR, Estimated 38 (L) >60 mL/min    Comment: (NOTE) Calculated using  the CKD-EPI Creatinine Equation (2021)    Anion gap 7 5 - 15    Comment: Performed at [facility], [place] [address] [place], [place] [address]  Glucose, capillary     Status: None   Collection Time: 07/19/20 12:32 PM  Result Value Ref Range   Glucose-Capillary 98 70 - 99 mg/dL    Comment: Glucose reference range applies only to samples taken after fasting for at least 8 hours.  Glucose, capillary     Status: Abnormal   Collection Time: 07/19/20  5:14 PM  Result Value Ref Range   Glucose-Capillary 103 (H) 70 - 99 mg/dL    Comment: Glucose reference range applies only to samples taken after fasting for at least 8 hours.    DG Chest 1 View  Result Date: 07/19/2020 CLINICAL DATA:  Low oxygen saturation EXAM: CHEST  1 VIEW COMPARISON:  05/14/2020, 07/18/2020 FINDINGS: Shallow inspiration with atelectasis in the lung bases. Scarring or atelectasis in the medial left upper lung, similar to prior study. Cardiac enlargement. No definite consolidation or edema. Subcutaneous emphysema in the left breast and left chest wall. Probable developing pleural effusions. IMPRESSION: 1. Shallow inspiration with atelectasis in the lung bases. 2. Developing bilateral pleural effusions. 3. Subcutaneous emphysema in the left breast and left chest wall. Electronically Signed   By: Lucienne Capers M.D.   On: 07/19/2020 05:02   CT HEAD WO CONTRAST  Result Date: 07/19/2020 CLINICAL DATA:  Mental status change, unknown cause. EXAM: CT HEAD WITHOUT CONTRAST TECHNIQUE: Contiguous axial images were obtained from the base of the skull through the vertex without intravenous contrast.  COMPARISON:  Head CT 06/03/2020.  Brain MRI 02/20/2020. FINDINGS: Brain: Cerebral volume is normal for age. There is no acute intracranial hemorrhage. No demarcated cortical infarct. No extra-axial fluid collection. No evidence of intracranial mass. No midline shift. Partially empty sella turcica. Vascular: No hyperdense  vessel. Skull: Normal. Negative for fracture or focal lesion. Sinuses/Orbits: Visualized orbits show no acute finding. No significant paranasal sinus disease at the imaged levels. 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 IMPRESSION: No evidence of acute intracranial abnormality. Redemonstrated partially empty sella turcica. This finding is very commonly incidental, but can be associated with idiopathic intracranial hypertension. Incidentally noted advanced left TMJ osteoarthrosis. Electronically Signed   By: Kellie Simmering DO   On: 07/19/2020 12:26   DG CHEST PORT 1 VIEW  Result Date: 07/18/2020 CLINICAL DATA:  59 year old female with right PICC line. EXAM: PORTABLE CHEST 1 VIEW COMPARISON:  05/14/2020 FINDINGS: No cardiomegaly. Obscuration of the left superior mediastinum due to linear opacification. Interval insertion right upper extremity peripherally inserted central catheter with the catheter tip at the cavoatrial junction. No pleural effusion. No pneumothorax. The visualized skeletal structures are unremarkable. IMPRESSION: 1. Streaky linear opacity in the medial aspect left upper lobe. Differential diagnosis is broad, however after recent intubation, sequela of aspiration with possible superimposed infection is most likely. Mucous plugging with left upper lobe atelectasis could appear similarly. Widening of the mediastinum due to etiology such as mediastinal hemorrhage is least likely. If there is any clinical concern for mediastinal process, recommend CT chest. 2. Right upper extremity PICC line in place with the tip at the cavoatrial junction. These results will be called to the ordering clinician or representative by the Radiologist Assistant, and communication documented in the PACS or [employer]. Ruthann Cancer, MD [facility] Radiology Electronically Signed   By: Ruthann Cancer MD   On: 07/18/2020 11:38     I have  reviewed the images obtained:CT head neg for acute.   Primary Diagnosis:  1. Persistent encephalopathy-Agree that this is multifactoral-anesthia clearance, hypoxia, likely has hypoventilation syndrome due to her body habitus, medications.  Impression: 59 yo female with morbid obesity and PMHX as above who has been hyper somulent since surgery on the 24th. Per RN, she seemed to be a little better today as she helped with toileting and walking back to bed.   Recommendations: 1. Continue to monitor O2 levels and sugars as hypoglycemia could play a role.  2. Continue to monitor electrolytes and replete prn.. We will give Thiamine.  2. Will get MRI brain to r/o stroke. If neg, neuro will sign off.   Clance Boll, NP/Neuro Case/findings discussed with Dr. Meredeth Ide who will review and edit note as needed.    NEUROHOSPITALIST ADDENDUM I was unfortunately not able to see Ms. Kastens as she was down for MRI Brain today.  I have reviewed the contents of history and physical exam as documented by PA/ARNP/Resident and agree with above documentation.  I have discussed and formulated the above plan as documented. Edits to the note have been made as needed.  Impression: 30 F with delayed wakefullness after surgery. It is not uncommon for obese patient's to take some time to wake up due to distribution of anesthesia into adipose tissues. Key exam findings: I was unable to see Ms. Apperson as she was down for MRI Brain but I did discuss plan with the Neuro NP and also spoke to the hospitalist. It seems that patient is now more  awake and conversant. Plan: No further workup if MRI Brain is negative. Hospitalist will let us know if they need our assistance.  Donnetta Simpers, MD [facility] [phone]   If 7pm to 7am, please call on call as listed on AMION.

## 2020-07-19 NOTE — Evaluation (Signed)
Physical Therapy Evaluation Patient Details Name: Valerie [name] MRN: [id number] DOB: Jul 09, 1961 Today's Date: 07/19/2020   History of Present Illness  59 year old female who presents with colovesical fistula, s/p rectosigmoid resection and fistula repair 07/17/20. PMH includes bipolar disorder, CAD, CKD, psoriatic arthritis, NSTEMI, B THA. Recent admission 10/11-10/21/21 for colovesical fistula and frequent falls.  Clinical Impression  Pt admitted with above diagnosis.  Pt extremely lethargic but arouses with multi-modal stimuli. Oriented once alert. Pt is globally deconditioned compounded by surgery and post op bedrest. Will likely need STSNF post acute.continue to follow in acute setting  Pt currently with functional limitations due to the deficits listed below (see PT Problem List). Pt will benefit from skilled PT to increase their independence and safety with mobility to allow discharge to the venue listed below.       Follow Up Recommendations SNF    Equipment Recommendations  None recommended by PT    Recommendations for Other Services       Precautions / Restrictions Precautions Precautions: Fall Precaution Comments: JP drain on R Restrictions Weight Bearing Restrictions: No      Mobility  Bed Mobility Overal bed mobility: Needs Assistance Bed Mobility: Rolling;Sidelying to Sit Rolling: Min assist;Mod assist Sidelying to sit: Min assist;Mod assist       General bed mobility comments: cues for sequence, assist to bring LEs off bed and elevate trunk    Transfers Overall transfer level: Needs assistance Equipment used: Rolling walker (2 wheeled) Transfers: Sit to/from [facility] Sit to Stand: Min assist;+2 safety/equipment Stand pivot transfers: Min assist;+2 physical assistance;+2 safety/equipment       General transfer comment: cues for hand placement adn to power up with LEs, pt uses momentum and counting to stand. repeated x2 for activity  tolerance  Ambulation/Gait Ambulation/Gait assistance: Min assist;+2 safety/equipment;+2 physical assistance Gait Distance (Feet): 4 Feet Assistive device: Rolling walker (2 wheeled) Gait Pattern/deviations: Step-to pattern     General Gait Details: assist for safety and balance, cues for task sequence. pivotal steps bed to recliner  Stairs            Wheelchair Mobility    Modified Rankin (Stroke Patients Only)       Balance Overall balance assessment: Needs assistance Sitting-balance support: Feet supported;Bilateral upper extremity supported Sitting balance-Leahy Scale: Fair     Standing balance support: Bilateral upper extremity supported Standing balance-Leahy Scale: Poor Standing balance comment: reliant on RW and  external support                             Pertinent Vitals/Pain Pain Assessment: No/denies pain    Home Living Family/patient expects to be discharged to:: Private residence Living Arrangements: Non-relatives/Friends (roommate) Available Help at Discharge: Other (Comment) Type of Home: Other(Comment) (condo) Home Access: Stairs to enter   Entrance Stairs-Number of Steps: 1 Home Layout: Two level;Able to live on main level with bedroom/bathroom Home Equipment: Gilford Rile - 2 wheels;Cane - single point;Wheelchair - manual;Bedside commode;Grab bars - toilet Additional Comments: per pt sister they placed temporary bed downstairs as pt has been unable to get upstairs for 2-3 mos    Prior Function Level of Independence: Needs assistance   Gait / Transfers Assistance Needed: supervision with ambulation + RW, uses w/c in community. reports multiple falls, roommate calls family when pt falls to get her up  ADL's / Homemaking Assistance Needed: Needs occasional assist for ADL/IADL tasks.  Comments: DTR, patient's sister  or roommate provides assist for ADL/IADLs     Hand Dominance        Extremity/Trunk Assessment   Upper Extremity  Assessment Upper Extremity Assessment: Defer to OT evaluation;Generalized weakness    Lower Extremity Assessment Lower Extremity Assessment: Generalized weakness       Communication   Communication: No difficulties  Cognition Arousal/Alertness: Lethargic;Suspect due to medications Behavior During Therapy: [facility] for tasks assessed/performed   Area of Impairment: Following commands;Problem solving                       Following Commands: Follows one step commands with increased time;Follows multi-step commands inconsistently     Problem Solving: Decreased initiation;Slow processing;Difficulty sequencing;Requires verbal cues;Requires tactile cues General Comments: pt sister reports she has been asleep all day      General Comments      Exercises     Assessment/Plan    PT Assessment Patient needs continued PT services  PT Problem List Decreased strength;Decreased activity tolerance;Decreased mobility;Decreased safety awareness;Decreased balance;Decreased knowledge of use of DME;Decreased cognition;Cardiopulmonary status limiting activity       PT Treatment Interventions DME instruction;Therapeutic exercise;Gait training;Functional mobility training;Therapeutic activities;Patient/family education;Balance training    PT Goals (Current goals can be found in the Care Plan section)  Acute Rehab PT Goals Patient Stated Goal: go to rehab PT Goal Formulation: With patient/family Time For Goal Achievement: 08/01/20 Potential to Achieve Goals: Good    Frequency Min 2X/week   Barriers to discharge        Co-evaluation               AM-PAC PT "6 Clicks" Mobility  Outcome Measure Help needed turning from your back to your side while in a flat bed without using bedrails?: A Lot Help needed moving from lying on your back to sitting on the side of a flat bed without using bedrails?: A Lot Help needed moving to and from a bed to a chair (including a wheelchair)?: A  Lot Help needed standing up from a chair using your arms (e.g., wheelchair or bedside chair)?: Total Help needed to walk in hospital room?: A Lot Help needed climbing 3-5 steps with a railing? : Total 6 Click Score: 10    End of Session Equipment Utilized During Treatment: Gait belt Activity Tolerance: Patient tolerated treatment well Patient left: in chair;with call bell/phone within reach;with family/visitor present;with chair alarm set        Time: 1543-1601 PT Time Calculation (min) (ACUTE ONLY): 18 min   Charges:   PT Evaluation $PT Eval Low Complexity: [place], PT  Acute Rehab Dept ([place]) [phone] Pager [phone]  07/19/2020   [facility] 07/19/2020, 5:32 PM

## 2020-07-19 NOTE — Progress Notes (Addendum)
CT head unremarkable for acute pathology. MRI brain ordered to rule out acute stroke  I also discussed the case with Dr. Lorrin Goodell, Neurology, who agreed with the current plan and also recommended a TSH, Ammonia level, AM cortisol and agreed to see in the patient in consult in the AM.  Marva Panda, DO

## 2020-07-19 NOTE — Consult Note (Addendum)
Medical Consultation   Nature Kueker  EYC:[id number]  DOB: 1961/02/01  DOA: 07/17/2020  PCP: Ophelia Shoulder, MD    Requesting physician: Dr. Johney Maine  Reason for consultation: Post op hypoxia and persistent grogginess.    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 Alinda Money followed by robotic low anterior rectosigmoid resection and colovesical fistula repair with Dr. Johney Maine on 07/17/2020.  She has been hospitalized since.    Early this a.m. (11/26), she was noted by nursing to have had persisting grogginess since her surgery on 11/24.  She was placed on CPAP for sleep at 5 but pulled her CPAP off multiple times overnight and was noted to have O2 sat 79% on room air. She was subsequently placed on nasal cannula at 5 L and remained in the low 90s. Overnight primary team ordered an ABG, CXR and albuterol neb.  CXR showed atelectasis in the lung bases with developing bilateral pleural effusions and subcutaneous emphysema in the left breast and left chest wall.  ABG: pH 7.39, pCO2 47, pO2 70, Bicarb 29. She was given Lasix 40 mg IV x1.  Given her persistent grogginess and hypoxia, TRH was consulted.  Today, the patient states that since her surgery, she has been very tired and with generalized weakness without improvement. States that her speech has been slurred since surgery but was normal prior. She denies any focal weakness. Denies cough, wheeze, chest pain but admits to shortness of breath.     Review of Systems:  Review of Systems  All other systems reviewed and are negative.  As per HPI otherwise 10 point review of systems negative.      Past Medical History: Past Medical History:  Diagnosis Date  . Anemia   . Anxiety   . Arthritis    Psoriatic  . Bipolar disorder ([place])   . CKD (chronic kidney disease), stage III ([place])   . Colovesical fistula   . Depression   . DM type 2 (diabetes mellitus, type 2) ([place]) 11/14/2019  . Dyspnea   . Headache   . Neuropathy   . NSTEMI (non-ST elevated myocardial infarction) ([place]) 11/14/2019  . OSA (obstructive sleep apnea)   . Psoriatic arthritis ([place])   . Sleep apnea   . Stroke [facility])    Memory issues, March 2021    Past Surgical History: Past Surgical History:  Procedure Laterality Date  . APPENDECTOMY    . CHOLECYSTECTOMY    . CYSTOSCOPY WITH STENT PLACEMENT N/A 07/17/2020   Procedure: CYSTOSCOPY WITH BILATERAL FIREFLY INJECTION;  Surgeon: Raynelle Bring, MD;  Location: [facility] ORS;  Service: Urology;  Laterality: N/A;  . FLEXIBLE SIGMOIDOSCOPY N/A 05/17/2020   Procedure: FLEXIBLE SIGMOIDOSCOPY;  Surgeon: Carol Ada, MD;  Location: [facility] ENDOSCOPY;  Service: Endoscopy;  Laterality: N/A;  . FOOT SURGERY Right   . LEFT HEART CATH AND CORONARY ANGIOGRAPHY N/A 11/14/2019   Procedure: LEFT HEART CATH AND CORONARY ANGIOGRAPHY;  Surgeon: Martinique, Peter M, MD;  Location: [facility] CV LAB;  Service: Cardiovascular;  Laterality: N/A;  . PROCTOSCOPY N/A 07/17/2020   Procedure: RIGID PROCTOSCOPY;  Surgeon: Michael Boston, MD;  Location: [facility];  Service: General;  Laterality: N/A;  . TONSILLECTOMY    . TOTAL HIP ARTHROPLASTY Bilateral      Allergies:   Allergies  Allergen Reactions  . Carbamazepine Other (See Comments)    Parkinsons like symptoms tremors  . Sertraline Hcl Other (See Comments)    Unknown reaction     Social History:  reports that she has been smoking. She has been smoking about 0.50 packs per day. She has never used smokeless tobacco. She reports previous alcohol use. She reports that she does not use drugs.   Family History: Family History  Problem  Relation Age of Onset  . Breast cancer Neg Hx      Physical Exam: Vitals:   07/18/20 1411 07/18/20 2148 07/19/20 0502 07/19/20 0525  BP: (!) 104/57 114/70 (!) 121/52   Pulse: 80 77 86   Resp: 18 18 18    Temp: 99.3 F (37.4 C) 98.6 F (37 C) 98.6 F (37 C)   TempSrc: Oral Oral Oral   SpO2: 90% 93% 92% 90%  Weight:      Height:        Physical Exam Vitals and nursing note reviewed.  Constitutional:      General: She is not in acute distress.    Appearance: She is not toxic-appearing.  HENT:     Head: Normocephalic.     Mouth/Throat:     Mouth: Mucous membranes are moist.  Eyes:     Extraocular Movements: Extraocular movements intact.     Conjunctiva/sclera: Conjunctivae normal.  Cardiovascular:     Rate and Rhythm: Normal rate and regular rhythm.  Pulmonary:     Effort: Pulmonary effort is normal.     Breath sounds: Rales present. No wheezing.     Comments:  +Bibasilar rales and decreased bibasilar breath sounds +O2 sat decreased to 80% when laying forward for lung exam and returned to 90s with sitting upright and deep breaths Abdominal:     General: There is no distension.     Comments: Well healing post op abdomen with drain  Musculoskeletal:        General: No swelling or tenderness.     Comments: RUE PICC without erythema  Skin:    Coloration: Skin is not jaundiced or pale.  Neurological:     Mental Status: She is lethargic.     Cranial Nerves: Dysarthria present. No facial asymmetry.     Sensory: No sensory deficit.     Motor: No pronator drift.     Coordination: Coordination abnormal. Finger-Nose-Finger Test abnormal.     Comments: Oriented x 2 to person and place, not time  Psychiatric:        Mood and Affect: Mood normal.        Behavior: Behavior normal.      Data reviewed:  I have personally reviewed following labs and imaging studies Labs:  CBC: Recent Labs  Lab 07/17/20 0645 07/18/20 0449 07/19/20 0543  WBC 7.4 15.0* 14.2*  NEUTROABS  5.3  --   --   HGB 9.7* 8.5* 8.6*  HCT 31.7* 26.7* 27.8*  MCV 104.6* 105.1* 106.9*  PLT 218 224 546    Basic Metabolic Panel: Recent Labs  Lab 07/17/20 0645 07/17/20 0645 07/18/20 0449 07/18/20 0955 07/19/20 0543  NA 137  --  136  --  141  K 3.9   < > 4.6  --  4.4  4.2  CL 102  --  104  --  108  CO2 28  --  26  --  27  GLUCOSE 109*  --  124*  --  89  BUN 20  --  20  --  22*  CREATININE 1.64*  --  1.70*  --  1.56*  1.50*  CALCIUM 11.3*  --  10.7*  --  11.6*  MG  --   --  1.7  --   --   PHOS  --   --   --  3.1  --    < > = values in this interval not displayed.   GFR Estimated Creatinine Clearance: 44.8 mL/min (A) (by C-G formula based on SCr of 1.5 mg/dL (H)). Liver Function Tests: Recent Labs  Lab 07/17/20 0645  AST 18  ALT 12  ALKPHOS 73  BILITOT 0.4  PROT 6.0*  ALBUMIN 3.1*   No results for input(s): LIPASE, AMYLASE in the last 168 hours. No results for input(s): AMMONIA in the last 168 hours. Coagulation profile No results for input(s): INR, PROTIME in the last 168 hours.  Cardiac Enzymes: No results for input(s): CKTOTAL, CKMB, CKMBINDEX, TROPONINI in the last 168 hours. BNP: Invalid input(s): POCBNP CBG: Recent Labs  Lab 07/17/20 0557  GLUCAP 96   D-Dimer No results for input(s): DDIMER in the last 72 hours. Hgb A1c No results for input(s): HGBA1C in the last 72 hours. Lipid Profile No results for input(s): CHOL, HDL, LDLCALC, TRIG, CHOLHDL, LDLDIRECT in the last 72 hours. Thyroid function studies No results for input(s): TSH, T4TOTAL, T3FREE, THYROIDAB in the last 72 hours.  Invalid input(s): FREET3 Anemia work up Recent Labs    07/18/20 0955  VITAMINB12 1,723*  FOLATE 9.3  FERRITIN 84  TIBC 225*  IRON 12*  RETICCTPCT 3.1   Urinalysis    Component Value Date/Time   COLORURINE YELLOW 06/03/2020 1405   APPEARANCEUR CLOUDY (A) 06/03/2020 1405   LABSPEC 1.004 (L) 06/03/2020 1405   PHURINE 6.0 06/03/2020 1405   GLUCOSEU NEGATIVE  06/03/2020 1405   HGBUR MODERATE (A) 06/03/2020 1405   BILIRUBINUR NEGATIVE 06/03/2020 1405   KETONESUR NEGATIVE 06/03/2020 1405   PROTEINUR NEGATIVE 06/03/2020 1405   NITRITE POSITIVE (A) 06/03/2020 1405   LEUKOCYTESUR LARGE (A) 06/03/2020 1405     Microbiology Recent Results (from the past 240 hour(s))  SARS CORONAVIRUS 2 (TAT 6-24 HRS) Nasopharyngeal Nasopharyngeal Swab     Status: None   Collection Time: 07/13/20 10:40 AM   Specimen: Nasopharyngeal Swab  Result Value Ref Range Status   SARS Coronavirus 2 NEGATIVE NEGATIVE Final    Comment: (NOTE) SARS-CoV-2 target nucleic acids are NOT DETECTED.  The SARS-CoV-2 RNA is generally detectable in upper and lower respiratory specimens during the acute phase of infection. Negative results do not preclude SARS-CoV-2 infection, do not rule out co-infections with other pathogens, and should not be used as the sole basis for treatment or other patient management decisions. Negative results must be combined with clinical observations, patient history, and epidemiological information. The expected result is Negative.  Fact Sheet for Patients: SugarRoll.be  Fact Sheet for Healthcare Providers: https://www.woods-mathews.com/  This test is not yet approved or cleared by the [place] FDA and  has been authorized for detection and/or diagnosis of SARS-CoV-2 by FDA under an Emergency Use Authorization (EUA). This EUA will remain  in effect (meaning this test can be used) for the duration of the COVID-19 declaration under Se ction 564(b)(1) of the Act, 21 U.S.C. section 360bbb-3(b)(1), unless the authorization is terminated or revoked sooner.  Performed at [facility]  Lab, [address], [place], [place] [address]        Inpatient Medications:   Scheduled Meds: . acetaminophen  500 mg Oral Q6H  . alvimopan  12 mg Oral BID  . vitamin C  500 mg Oral QHS  . aspirin EC  81 mg Oral  Daily  . B-complex with vitamin C  1 tablet Oral QHS  . Chlorhexidine Gluconate Cloth  6 each Topical Daily  . cholecalciferol  1,000 Units Oral Daily  . enoxaparin (LOVENOX) injection  40 mg Subcutaneous Q24H  . escitalopram  30 mg Oral Daily  . feeding supplement (GLUCERNA SHAKE)  237 mL Oral BID BM  . insulin aspart  0-15 Units Subcutaneous TID WC  . insulin aspart  0-5 Units Subcutaneous QHS  . lamoTRIgine  150 mg Oral BID  . lip balm  1 application Topical BID  . nadolol  10 mg Oral Daily  . pantoprazole  40 mg Oral Daily  . predniSONE  10 mg Oral QODAY  . predniSONE  5 mg Oral QODAY  . psyllium  1 packet Oral BID  . rosuvastatin  5 mg Oral Daily  . sodium chloride flush  10-40 mL Intracatheter Q12H  . sodium chloride flush  3 mL Intravenous Q12H  . traZODone  200 mg Oral QHS   Continuous Infusions: . sodium chloride    . methocarbamol (ROBAXIN) IV       Radiological Exams on Admission: DG Chest 1 View  Result Date: 07/19/2020 CLINICAL DATA:  Low oxygen saturation EXAM: CHEST  1 VIEW COMPARISON:  05/14/2020, 07/18/2020 FINDINGS: Shallow inspiration with atelectasis in the lung bases. Scarring or atelectasis in the medial left upper lung, similar to prior study. Cardiac enlargement. No definite consolidation or edema. Subcutaneous emphysema in the left breast and left chest wall. Probable developing pleural effusions. IMPRESSION: 1. Shallow inspiration with atelectasis in the lung bases. 2. Developing bilateral pleural effusions. 3. Subcutaneous emphysema in the left breast and left chest wall. Electronically Signed   By: Lucienne Capers M.D.   On: 07/19/2020 05:02   DG CHEST PORT 1 VIEW  Result Date: 07/18/2020 CLINICAL DATA:  59 year old female with right PICC line. EXAM: PORTABLE CHEST 1 VIEW COMPARISON:  05/14/2020 FINDINGS: No cardiomegaly. Obscuration of the left superior mediastinum due to linear opacification. Interval insertion right upper extremity peripherally  inserted central catheter with the catheter tip at the cavoatrial junction. No pleural effusion. No pneumothorax. The visualized skeletal structures are unremarkable. IMPRESSION: 1. Streaky linear opacity in the medial aspect left upper lobe. Differential diagnosis is broad, however after recent intubation, sequela of aspiration with possible superimposed infection is most likely. Mucous plugging with left upper lobe atelectasis could appear similarly. Widening of the mediastinum due to etiology such as mediastinal hemorrhage is least likely. If there is any clinical concern for mediastinal process, recommend CT chest. 2. Right upper extremity PICC line in place with the tip at the cavoatrial junction. These results will be called to the ordering clinician or representative by the Radiologist Assistant, and communication documented in the PACS or [employer]. Ruthann Cancer, MD [facility] Radiology Electronically Signed   By: Ruthann Cancer MD   On: 07/18/2020 11:38    Impression/Recommendations Principal Problem:   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 Active Problems:   Anxiety   Obesity, Class III, BMI 40-49.9 (morbid obesity) ([place])   CKD (chronic kidney disease), stage III ([place])   Psoriatic arthritis ([place])  OSA (obstructive sleep apnea)   Bipolar disorder ([facility])   DM type 2 (diabetes mellitus, type 2) ([facility])   CAD (coronary artery disease)   Protein-calorie malnutrition, moderate ([facility])   Anemia, chronic disease   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   Altered mental status   Acute respiratory failure with hypoxemia ([place])    1. Altered mental status, likely multifactorial: hypoxemia, medication induced, concern for stroke a. Patient with persistent fatigue, garbled speech (new this admission), abnormal cerebellar neuro exam (however she did have a previous right sided cerebellar stroke in  March - no records of her previous neuro exam to compare to) b. CT head, may need an MRI pending CT results c. Limit opiate and benzo use (received Norco last night) d. Check CMP/full electrolytes and monitor glucose (may have hypoglycemia component) e. EKG f. Physical therapy g. Hold gabapentin, aricept, benadryl.  h. If no improvement, can consider briefly decreasing Lamictal if she has an AKI (95% renally cleared) i. Hypoxemia - treatment as below  2. Acute hypoxemic respiratory failure, likely multifactorial: atelectasis, possible volume overload a. Agree with Lasix dose, monitor renal function b. Incentive spirometry c. Out of bed as tolerated if ok with surgery  3. Colovesical Fistula due to Diverticulitis s/p robotic low anterior rectosigmoid resection and fistula repair with Dr. Johney Maine, POD 2 a. Per primary team  4. Type 2 Diabetes a. Increase frequency of glucose checks  5. Psoriatic arthritis a. Continue current management  6. Bipolar disorder a. On Lamictal  7. CKD 3b, at baseline  8. OSA  a. On CPAP   Thank you for this consultation.  Our [facility] hospitalist team will follow the patient with you.   Time Spent: 75 minutes  Harold Hedge D.O.  [facility] 07/19/2020, 9:42 AM

## 2020-07-19 NOTE — Progress Notes (Signed)
IV team notes: Scant blood return from PICC [place]. Lab notified for lab work.

## 2020-07-19 NOTE — Progress Notes (Signed)
Went to get pt around 1740. Pt is on an O2 monitor and MD informed me that pt needs to stay on it for MRI. I informed MD that a RN will need to come down with pt to monitor her O2. Floor is short staffed and unable to come down now.

## 2020-07-19 NOTE — Progress Notes (Signed)
Previous shift reported that pt has been groggy with an O2 sat in the low 90s while on 5L O2. Pt placed on CPAP for sleep on 5L O2 with an O2 sat of 88%; 79% on RA. Pt is still groggy and pulled off CPAP multiple times. RN placed pt back on [place] at 5L O2. MD Nani Gasser notified. ABG, CXR, and albuterol neb was ordered. Continuing to monitor pt.

## 2020-07-19 NOTE — Consult Note (Signed)
[place] nurse follow-up Patient receiving care in [place] Patient was marked for colostomy on 07/08/20. Surgical procedure performed on 07/17/20.  Colostomy not needed for this procedure.  WOC will sign off at this time.   Please re-consult the [place] team if needed.  Cathlean Marseilles Tamala Julian, MSN, RN, [place], Lysle Pearl, [facility] Wound Treatment Associate Pager [phone]

## 2020-07-19 NOTE — Progress Notes (Addendum)
Valerie [name] [id number] [id number]  CARE TEAM:  PCP: Ophelia Shoulder, MD  Outpatient Care Team: Patient Care Team: Ophelia Shoulder, MD as PCP - General (Internal Medicine) [name], MD as PCP - Cardiology (Cardiology) Janith Lima, MD as Consulting Physician (Urology) Michael Boston, MD as Consulting Physician (Colon and Rectal Surgery) Carol Ada, MD as Consulting Physician (Gastroenterology) [name], MD as Consulting Physician (Cardiology) Tenna Child, RN as [place] Nurse  Inpatient Treatment Team: Treatment Team: Attending Provider: Michael Boston, MD; [place] Nurse: Tenna Child, RN; Technician: Ernest Mallick, NT; Physical Therapist: Neil Crouch, PT; Technician: Karna Christmas, NT; Utilization Review: Fortino Sic, RN; Registered Nurse: Donia Ast, RN; Consulting Physician: Harold Hedge, MD; Rounding Team: Jackelyn Knife, MD   Problem List:   Principal Problem:   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 Active Problems:   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   Anxiety   Obesity, Class III, BMI 40-49.9 (morbid obesity) ([place])   CKD (chronic kidney disease), stage III (HCC)   Psoriatic arthritis ([place])   OSA (obstructive sleep apnea)   Bipolar disorder ([place])   DM type 2 (diabetes mellitus, type 2) ([place])   CAD (coronary artery disease)   Protein-calorie malnutrition, moderate (HCC)   CKD (chronic kidney disease) stage 2, GFR 60-89 ml/min   Anemia, chronic disease   2 Days Post-Op    07/17/2020  POST-OPERATIVE DIAGNOSIS:  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 Diverticulitis with colovesical fistula  Postoperative diagnosis:  1. Diverticulitis with colovesical fistula  Procedure: 1. Cystoscopy 2. Bilateral ureteral cannulation and injection of indocyanine green dye  Surgeon: Pryor Curia. M.D.  Assessment  FAIR  [facility] Stay = 2 days)  Plan:  -Advance diet per protocol given the fact patient completely oriented and hungry.  Try mechanically soft diet.  Hold off on TNA given concerns of pulmonary fluid edema.  May reconsider if she does not advance well given that her moderate malnutrition  Pain control.  Hold gabapentin given grogginess.  Low-dose Tylenol.  Patient used to hydrocodone to use for p.o.  Hypoxia most likely related to pulmonary issues despite try to keep on the dry side.  We will give 1 dose of Lasix.  I have asked internal medicine consult.  Discussed with Dr. Maryln Gottron who will evaluate the patient and offer further insights.    Grogginess better but concerning - medicine to see.    Follow-up on pathology  Foley catheter for at least 7 days and then cystogram on postop day 7.  Low threshold to carry further out or have urology reinvolved if delayed healing.  She is high risk for this given her chronic immunosuppression and large fistula requiring 2 layer closure and omentopexy  Acute on chronic anemia.  IV iron given 11/25.  Check anemia panel since she seems to be macrocytic to rule out any folate and vitamin B12 or other deficiencies  Keep on low-dose steroids for now to minimize anastomotic healing issues while the same time preventing debilitating psoriatic arthritis.  Discussed with internal medicine.  Try to avoid bolusing high-dose steroids in order to minimize anastomotic leak risks unless would be  of benefit from a pulmonary standpoint.  See what medicine thanks  Pain control.  Hypercholesterolemia controlled  GERD control  CPAP for sleep  apnea  Borderline diabetes.  SSI & follow  VTE prophylaxis- SCDs, etc  -mobilize as tolerated to help recovery.  She is already rather deconditioned.  Hopefully can work with therapy when she is more alert.  We will get transitional care team involved since most likely she will need skilled nursing facility.  Physical therapy and Occupational Therapy evaluations.  See if she can do home health versus needing skilled facility given her medical complexity and deconditioned state  30 minutes spent in review, evaluation, examination, counseling, and coordination of care.  More than 50% of that time was spent in counseling.  07/19/2020    Subjective: (Chief complaint)  Patient with some hypoxia requiring oxygen.  Still somewhat groggy but more alert and oriented this morning.  Nursing in room.  Denies any nausea.  Starting to have bowel movements.    Objective:  Vital signs:  Vitals:   07/18/20 1411 07/18/20 2148 07/19/20 0502 07/19/20 0525  BP: (!) 104/57 114/70 (!) 121/52   Pulse: 80 77 86   Resp: 18 18 18    Temp: 99.3 F (37.4 C) 98.6 F (37 C) 98.6 F (37 C)   TempSrc: Oral Oral Oral   SpO2: 90% 93% 92% 90%  Weight:      Height:        Last BM Date: 07/17/20  Intake/Output   Yesterday:  11/25 0701 - 11/26 0700 In: 1030 [P.O.:480; I.V.:300; IV Piggyback:250] Out: 2550 [Urine:1900; Drains:650] This shift:  No intake/output data recorded.  Bowel function:  Flatus: YES  BM:  YES  Drain: Serosanguinous   Physical Exam:  General: Pt awake/alert in no acute distress.  Somewhat groggy but more alert today.  Joking. Eyes: PERRL, normal EOM.  Sclera clear.  No icterus Neuro: CN II-XII intact w/o focal sensory/motor deficits. Lymph: No head/neck/groin lymphadenopathy Psych:  No delerium/psychosis/paranoia.  Oriented x 4 HENT: Normocephalic, Mucus membranes moist.  No thrush Neck: Supple, No tracheal deviation.  No obvious thyromegaly Chest: No pain to  chest wall compression.  Good respiratory excursion.  No audible wheezing CV:  Pulses intact.  Regular rhythm.  No major extremity edema MS: Normal AROM mjr joints.  No obvious deformity  Abdomen: Soft.  Nondistended.  Mildly tender at incisions only.  Dressings clean dry and intact no evidence of peritonitis.  No incarcerated hernias.  GU: Pfannenstiel dressing dry.  Foley in place with clear dark yellow urine. Ext:  No deformity.  No mjr edema.  No cyanosis Skin: No petechiae / purpurea.  No major sores.  Warm and dry    Results:   Cultures: Recent Results (from the past 720 hour(s))  SARS CORONAVIRUS 2 (TAT 6-24 HRS) Nasopharyngeal Nasopharyngeal Swab     Status: None   Collection Time: 07/13/20 10:40 AM   Specimen: Nasopharyngeal Swab  Result Value Ref Range Status   SARS Coronavirus 2 NEGATIVE NEGATIVE Final    Comment: (NOTE) SARS-CoV-2 target nucleic acids are NOT DETECTED.  The SARS-CoV-2 RNA is generally detectable in upper and lower respiratory specimens during the acute phase of infection. Negative results do not preclude SARS-CoV-2 infection, do not rule out co-infections with other pathogens, and should not be used as the sole basis for treatment or other patient management decisions. Negative results must be combined with clinical observations, patient history, and epidemiological information. The expected result is Negative.  Fact Sheet for Patients: SugarRoll.be  Fact Sheet for Healthcare Providers: https://www.woods-mathews.com/  This test is not yet approved or cleared by the [place] FDA and  has been authorized for detection and/or diagnosis of SARS-CoV-2 by FDA under an Emergency Use Authorization (EUA). This EUA will remain  in effect (meaning this test can be used) for the duration of the COVID-19 declaration under Se ction 564(b)(1) of the Act, 21 U.S.C. section 360bbb-3(b)(1), unless the authorization is  terminated or revoked sooner.  Performed at [facility], [place] [address] [place], [place] [address]     Labs: Results for orders placed or performed during the hospital encounter of 07/17/20 (from the past 48 hour(s))  Basic metabolic panel     Status: Abnormal   Collection Time: 07/18/20  4:49 AM  Result Value Ref Range   Sodium 136 135 - 145 mmol/L   Potassium 4.6 3.5 - 5.1 mmol/L   Chloride 104 98 - 111 mmol/L   CO2 26 22 - 32 mmol/L   Glucose, Bld 124 (H) 70 - 99 mg/dL    Comment: Glucose reference range applies only to samples taken after fasting for at least 8 hours.   BUN 20 6 - 20 mg/dL   Creatinine, Ser 1.70 (H) 0.44 - 1.00 mg/dL   Calcium 10.7 (H) 8.9 - 10.3 mg/dL   GFR, Estimated 34 (L) >60 mL/min    Comment: (NOTE) Calculated using the CKD-EPI Creatinine Equation (2021)    Anion gap 6 5 - 15    Comment: Performed at [facility], [place] [address], [place], [place] [address]  CBC     Status: Abnormal   Collection Time: 07/18/20  4:49 AM  Result Value Ref Range   WBC 15.0 (H) 4.0 - 10.5 K/uL   RBC 2.54 (L) 3.87 - 5.11 MIL/uL   Hemoglobin 8.5 (L) 12.0 - 15.0 g/dL   HCT 26.7 (L) 36 - 46 %   MCV 105.1 (H) 80.0 - 100.0 fL   MCH 33.5 26.0 - 34.0 pg   MCHC 31.8 30.0 - 36.0 g/dL   RDW 13.7 11.5 - 15.5 %   Platelets 224 150 - 400 K/uL   nRBC 0.0 0.0 - 0.2 %    Comment: Performed at [facility], [place] [address], [place], [place] [address]  Magnesium     Status: None   Collection Time: 07/18/20  4:49 AM  Result Value Ref Range   Magnesium 1.7 1.7 - 2.4 mg/dL    Comment: Performed at [facility], [place] [address] [place], [place] [address]  Vitamin B12     Status: Abnormal   Collection Time: 07/18/20  9:55 AM  Result Value Ref Range   Vitamin B-12 1,723 (H) 180 - 914 pg/mL    Comment: RESULTS CONFIRMED BY MANUAL DILUTION (NOTE) This assay is not validated for testing neonatal or myeloproliferative  syndrome specimens for Vitamin B12 levels. Performed at [facility], [place], [place] [address] [place], [place] [address]   Folate     Status: None   Collection Time: 07/18/20  9:55 AM  Result Value Ref Range   Folate 9.3 >5.9 ng/mL    Comment: Performed at [facility], [place] [address] [place], [place] [address]  Iron and TIBC     Status: Abnormal   Collection Time: 07/18/20  9:55 AM  Result Value Ref Range   Iron 12 (L) 28 - 170 ug/dL   TIBC 225 (  L) 250 - 450 ug/dL   Saturation Ratios 5 (L) 10.4 - 31.8 %   UIBC 213 ug/dL    Comment: Performed at [facility], [place] [address], [place], [place] [address]  Ferritin     Status: None   Collection Time: 07/18/20  9:55 AM  Result Value Ref Range   Ferritin 84 11 - 307 ng/mL    Comment: Performed at [facility], [place] [address] [place], [place] [address]  Reticulocytes     Status: Abnormal   Collection Time: 07/18/20  9:55 AM  Result Value Ref Range   Retic Ct Pct 3.1 0.4 - 3.1 %   RBC. 2.51 (L) 3.87 - 5.11 MIL/uL   Retic Count, Absolute 77.8 19.0 - 186.0 K/uL   Immature Retic Fract 22.2 (H) 2.3 - 15.9 %    Comment: Performed at [facility], [place] [address], [place], [place] [address]  Prealbumin     Status: Abnormal   Collection Time: 07/18/20  9:55 AM  Result Value Ref Range   Prealbumin 11.4 (L) 18 - 38 mg/dL    Comment: Performed at [facility], [place] [address], [place], [place] [address]  Phosphorus     Status: None   Collection Time: 07/18/20  9:55 AM  Result Value Ref Range   Phosphorus 3.1 2.5 - 4.6 mg/dL    Comment: Performed at [facility], [place] [address] [place], [place] [address]  Blood gas, arterial     Status: Abnormal   Collection Time: 07/19/20  5:25 AM  Result Value Ref Range   FIO2 40.00    O2 Content 5.0 L/min   Delivery systems NASAL CANNULA    pH, Arterial 7.395 7.35 - 7.45   pCO2  arterial 47.8 32 - 48 mmHg   pO2, Arterial 69.8 (L) 83 - 108 mmHg   Bicarbonate 28.6 (H) 20.0 - 28.0 mmol/L   Acid-Base Excess 3.4 (H) 0.0 - 2.0 mmol/L   O2 Saturation 93.7 %   Patient temperature 98.6    Collection site LEFT BRACHIAL    Drawn by [id number]     Comment: Performed at [place] [address], [place], [place] [address]  CBC     Status: Abnormal   Collection Time: 07/19/20  5:43 AM  Result Value Ref Range   WBC 14.2 (H) 4.0 - 10.5 K/uL   RBC 2.60 (L) 3.87 - 5.11 MIL/uL   Hemoglobin 8.6 (L) 12.0 - 15.0 g/dL   HCT 27.8 (L) 36 - 46 %   MCV 106.9 (H) 80.0 - 100.0 fL   MCH 33.1 26.0 - 34.0 pg   MCHC 30.9 30.0 - 36.0 g/dL   RDW 13.5 11.5 - 15.5 %   Platelets 198 150 - 400 K/uL   nRBC 0.0 0.0 - 0.2 %    Comment: Performed at [facility], [place] [address] [place], [place] [address]  Potassium     Status: None   Collection Time: 07/19/20  5:43 AM  Result Value Ref Range   Potassium 4.2 3.5 - 5.1 mmol/L    Comment: Performed at [facility], [place] [address], [place], [place] [address]  Creatinine, serum     Status: Abnormal   Collection Time: 07/19/20  5:43 AM  Result Value Ref Range   Creatinine, Ser 1.50 (H) 0.44 - 1.00 mg/dL   GFR, Estimated 40 (L) >60 mL/min    Comment: (NOTE) Calculated using  the CKD-EPI Creatinine Equation (2021) Performed at [facility], [place] [address], [place], [place] [address]     Imaging / Studies: DG Chest 1 View  Result Date: 07/19/2020 CLINICAL DATA:  Low oxygen saturation EXAM: CHEST  1 VIEW COMPARISON:  05/14/2020, 07/18/2020 FINDINGS: Shallow inspiration with atelectasis in the lung bases. Scarring or atelectasis in the medial left upper lung, similar to prior study. Cardiac enlargement. No definite consolidation or edema. Subcutaneous emphysema in the left breast and left chest wall. Probable developing pleural effusions. IMPRESSION: 1. Shallow inspiration  with atelectasis in the lung bases. 2. Developing bilateral pleural effusions. 3. Subcutaneous emphysema in the left breast and left chest wall. Electronically Signed   By: Lucienne Capers M.D.   On: 07/19/2020 05:02   DG CHEST PORT 1 VIEW  Result Date: 07/18/2020 CLINICAL DATA:  59 year old female with right PICC line. EXAM: PORTABLE CHEST 1 VIEW COMPARISON:  05/14/2020 FINDINGS: No cardiomegaly. Obscuration of the left superior mediastinum due to linear opacification. Interval insertion right upper extremity peripherally inserted central catheter with the catheter tip at the cavoatrial junction. No pleural effusion. No pneumothorax. The visualized skeletal structures are unremarkable. IMPRESSION: 1. Streaky linear opacity in the medial aspect left upper lobe. Differential diagnosis is broad, however after recent intubation, sequela of aspiration with possible superimposed infection is most likely. Mucous plugging with left upper lobe atelectasis could appear similarly. Widening of the mediastinum due to etiology such as mediastinal hemorrhage is least likely. If there is any clinical concern for mediastinal process, recommend CT chest. 2. Right upper extremity PICC line in place with the tip at the cavoatrial junction. These results will be called to the ordering clinician or representative by the Radiologist Assistant, and communication documented in the PACS or [employer]. Ruthann Cancer, MD [facility] Radiology Electronically Signed   By: Ruthann Cancer MD   On: 07/18/2020 11:38    Medications / Allergies: per chart  Antibiotics: Anti-infectives (From admission, onward)   Start     Dose/Rate Route Frequency Ordered Stop   07/17/20 2000  cefoTEtan (CEFOTAN) 2 g in sodium chloride 0.9 % 100 mL IVPB        2 g 200 mL/hr over 30 Minutes Intravenous Every 12 hours 07/17/20 1615 07/17/20 2359   07/17/20 1400  neomycin (MYCIFRADIN) tablet 1,000 mg   Status:  Discontinued       "And" Linked Group Details   1,000 mg Oral 3 times per day 07/17/20 0603 07/17/20 0609   07/17/20 1400  metroNIDAZOLE (FLAGYL) tablet 1,000 mg  Status:  Discontinued       "And" Linked Group Details   1,000 mg Oral 3 times per day 07/17/20 0603 07/17/20 0609   07/17/20 0615  cefoTEtan (CEFOTAN) 2 g in sodium chloride 0.9 % 100 mL IVPB        2 g 200 mL/hr over 30 Minutes Intravenous On call to O.R. 07/17/20 0603 07/17/20 5329        Note: Portions of this report may have been transcribed using voice recognition software. Every effort was made to ensure accuracy; however, inadvertent computerized transcription errors may be present.   Any transcriptional errors that result from this process are unintentional.    [name], MD, FACS, MASCRS Gastrointestinal and Minimally Invasive Surgery  [facility] [address] [place],  [address] [phone] Fax [phone] Main/Paging  CONTACT INFORMATION: Weekday (9AM-5PM) concerns: Call [facility] main office  at [phone] Weeknight (5PM-9AM) or Weekend/Holiday concerns: Check [email or web address] for General Surgery [facility] coverage (Please, do not use SecureChat as it is not reliable communication to operating surgeons for immediate patient care)      07/19/2020  8:01 AM

## 2020-07-20 ENCOUNTER — Inpatient Hospital Stay (HOSPITAL_COMMUNITY): Payer: Medicare Other

## 2020-07-20 DIAGNOSIS — R0902 Hypoxemia: Secondary | ICD-10-CM

## 2020-07-20 LAB — MAGNESIUM: Magnesium: 1.9 mg/dL (ref 1.7–2.4)

## 2020-07-20 LAB — CBC
HCT: 27.2 % — ABNORMAL LOW (ref 36.0–46.0)
Hemoglobin: 8.4 g/dL — ABNORMAL LOW (ref 12.0–15.0)
MCH: 32.2 pg (ref 26.0–34.0)
MCHC: 30.9 g/dL (ref 30.0–36.0)
MCV: 104.2 fL — ABNORMAL HIGH (ref 80.0–100.0)
Platelets: 212 10*3/uL (ref 150–400)
RBC: 2.61 MIL/uL — ABNORMAL LOW (ref 3.87–5.11)
RDW: 13.6 % (ref 11.5–15.5)
WBC: 13.4 10*3/uL — ABNORMAL HIGH (ref 4.0–10.5)
nRBC: 0 % (ref 0.0–0.2)

## 2020-07-20 LAB — CREATININE, FLUID (PLEURAL, PERITONEAL, JP DRAINAGE): Creat, Fluid: 1.4 mg/dL

## 2020-07-20 LAB — GLUCOSE, CAPILLARY
Glucose-Capillary: 85 mg/dL (ref 70–99)
Glucose-Capillary: 116 mg/dL — ABNORMAL HIGH (ref 70–99)
Glucose-Capillary: 122 mg/dL — ABNORMAL HIGH (ref 70–99)
Glucose-Capillary: 95 mg/dL (ref 70–99)

## 2020-07-20 LAB — BASIC METABOLIC PANEL
Anion gap: 8 (ref 5–15)
BUN: 19 mg/dL (ref 6–20)
CO2: 31 mmol/L (ref 22–32)
Calcium: 11.4 mg/dL — ABNORMAL HIGH (ref 8.9–10.3)
Chloride: 101 mmol/L (ref 98–111)
Creatinine, Ser: 1.33 mg/dL — ABNORMAL HIGH (ref 0.44–1.00)
GFR, Estimated: 46 mL/min — ABNORMAL LOW (ref >60)
Glucose, Bld: 88 mg/dL (ref 70–99)
Potassium: 3.9 mmol/L (ref 3.5–5.1)
Sodium: 140 mmol/L (ref 135–145)

## 2020-07-20 LAB — CORTISOL-AM, BLOOD: Cortisol - AM: 5.8 ug/dL — ABNORMAL LOW (ref 6.7–22.6)

## 2020-07-20 MED ORDER — FUROSEMIDE 40 MG PO TABS
40.0000 mg | ORAL_TABLET | Freq: Two times a day (BID) | ORAL | Status: AC
  Administered 2020-07-20 – 2020-07-21 (×3): 40 mg via ORAL
  Filled 2020-07-20 (×3): qty 1

## 2020-07-20 MED ORDER — PREDNISONE 5 MG PO TABS
10.0000 mg | ORAL_TABLET | Freq: Every day | ORAL | Status: DC
  Administered 2020-07-21 – 2020-07-23 (×3): 10 mg via ORAL
  Filled 2020-07-20 (×3): qty 2

## 2020-07-20 MED ORDER — HYDROCODONE-ACETAMINOPHEN 10-325 MG PO TABS
0.5000 | ORAL_TABLET | Freq: Four times a day (QID) | ORAL | Status: DC | PRN
  Administered 2020-07-21 – 2020-07-23 (×2): 1 via ORAL
  Filled 2020-07-20 (×2): qty 1

## 2020-07-20 NOTE — Progress Notes (Signed)
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 Postoperatively, patient was noted to be more sleepy and groggy so medicine was consulted.   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 Does have history of cerebellar stroke, now with some difficulty coordination, CT head without evidence of acute stroke.  Mental status already improving.  MRI of the brain today. Minimizing opiates and benzodiazepines. Electrolytes normal.  Ammonia normal.  TSH normal.  Arterial blood gas normal with no CO2 retention. Continue mobility. Discontinued dose of gabapentin, Aricept and Benadryl with clinical improvement. PT OT, out of bed.  Hypoxemia: Multifactorial.  Due to atelectasis.   Intermittently diuresing.  Aggressive chest physiotherapy and mobility.  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  Type 2 diabetes: Blood sugars are stable on current dose of insulin.  Psoriatic arthritis: On low-dose maintenance prednisone.  OSA on CPAP: Using CPAP at night.  Thank you for consult.  We will continue to follow this patient along with you while she is in the hospital. Will follow MRI and if positive will discuss with neurology.   DVT prophylaxis: enoxaparin (LOVENOX) injection 40 mg Start: 07/18/20 0800 SCD's Start: 07/17/20 1616 Place TED hose Start: 07/17/20 1616   Code Status: Full code Family Communication: Daughter at the bedside Disposition Plan: Status is: Inpatient  Remains inpatient appropriate because:Inpatient level of care appropriate due to severity of illness   Dispo: The patient is from: Home              Anticipated d/c is to: SNF              Anticipated d/c date is: > 3 days              Patient currently is not medically stable to d/c.         Consultants:   TRH  Procedures:   As above  Antimicrobials:   None   Subjective: Patient seen and examined on the morning rounds.  Her daughter was at the bedside.  Patient was more awake and alert today.  Family reported that she is more communicating and interacting today. On examination, she denied any complaints. Patient was noted to have some difficulty with coordination especially reaching to her mouth. Patient herself denies any focal weakness.  Objective: Vitals:   07/19/20 1420 07/19/20 2112 07/20/20 0500 07/20/20 0639  BP: 139/68 (!) 141/72  (!) 148/86  Pulse: 85 73  75  Resp: 20 20  17   Temp: 98.9 F (37.2 C) 99.5 F (37.5 C)  98.5 F (36.9 C)  TempSrc: Oral Oral  Oral  SpO2: 95% 96%  94%  Weight:   102 kg   Height:        Intake/Output Summary (Last 24 hours) at 07/20/2020 1127 Last data filed at 07/20/2020 0854 Gross per 24 hour   Intake 450 ml  Output 2004 ml  Net -1554 ml   Filed Weights   07/17/20 1622 07/18/20 0500 07/20/20 0500  Weight: 100.7 kg 102.8 kg 102 kg    Examination:  General exam: Appears calm and comfortable  Chronically sick looking.  Not in any distress.  Currently on 3 to 4 L of oxygen. Respiratory system: Poor air entry at bases. Cardiovascular system: S1 & S2 heard, RRR. N Gastrointestinal system: Abdomen is distended but nontender.  Obese and pendulous. Foley catheter with dark urine. Central nervous system: Alert and oriented.  Cranial nerves with no deficits. Bilateral upper and lower extremity motor and sensory normal. Difficulty coordinating with both hands to the mouth. Extremities: Symmetric 5 x 5 power. Skin: No rashes, lesions or ulcers Psychiatry: Judgement and insight appear normal. Mood & affect appropriate.     Data Reviewed: I have personally reviewed following labs and imaging studies  CBC: Recent Labs  Lab 07/17/20 0645 07/18/20 0449 07/19/20 0543 07/20/20 0349  WBC 7.4 15.0* 14.2* 13.4*  NEUTROABS 5.3  --   --   --   HGB 9.7* 8.5* 8.6* 8.4*  HCT 31.7* 26.7* 27.8* 27.2*  MCV 104.6* 105.1* 106.9* 104.2*  PLT 218 224 198 144   Basic Metabolic Panel: Recent Labs  Lab 07/17/20 0645 07/18/20 0449 07/18/20 0955 07/19/20 0543 07/19/20 0906 07/20/20 0349  NA 137 136  --  141 138 140  K 3.9 4.6  --  4.4  4.2 4.1 3.9  CL 102 104  --  108 104 101  CO2 28 26  --  27 27 31   GLUCOSE 109* 124*  --  89 97 88  BUN 20 20  --  22* 21* 19  CREATININE 1.64* 1.70*  --  1.56*  1.50* 1.56* 1.33*  CALCIUM 11.3* 10.7*  --  11.6* 11.7* 11.4*  MG  --  1.7  --   --   --  1.9  PHOS  --   --  3.1  --   --   --    GFR: Estimated Creatinine Clearance: 50.3 mL/min (A) (by C-G formula based on SCr of 1.33 mg/dL (H)). Liver Function Tests: Recent Labs  Lab 07/17/20 0645 07/19/20 0906  AST 18 17  ALT 12 14  ALKPHOS 73 65  BILITOT 0.4 0.9  PROT 6.0* 6.1*  ALBUMIN  3.1* 2.8*   No results for input(s): LIPASE, AMYLASE in the last 168 hours. Recent Labs  Lab 07/19/20 1902  AMMONIA 28   Coagulation Profile: No results for input(s): INR, PROTIME in the last 168 hours. Cardiac Enzymes: No results for input(s): CKTOTAL, CKMB, CKMBINDEX, TROPONINI in the last 168 hours. BNP (last 3 results) No results for input(s): PROBNP in the last 8760 hours. HbA1C: No results for input(s): HGBA1C in the last 72 hours. CBG: Recent Labs  Lab 07/17/20 0557 07/19/20 1232 07/19/20 1714 07/19/20 2223 07/20/20 0722  GLUCAP 96 98 103* 97 85   Lipid Profile: No results for input(s): CHOL, HDL, LDLCALC, TRIG, CHOLHDL, LDLDIRECT in the last 72 hours. Thyroid  Function Tests: Recent Labs    07/19/20 1902  TSH 1.301   Anemia Panel: Recent Labs    07/18/20 0955  VITAMINB12 1,723*  FOLATE 9.3  FERRITIN 84  TIBC 225*  IRON 12*  RETICCTPCT 3.1   Sepsis Labs: No results for input(s): PROCALCITON, LATICACIDVEN in the last 168 hours.  Recent Results (from the past 240 hour(s))  SARS CORONAVIRUS 2 (TAT 6-24 HRS) Nasopharyngeal Nasopharyngeal Swab     Status: None   Collection Time: 07/13/20 10:40 AM   Specimen: Nasopharyngeal Swab  Result Value Ref Range Status   SARS Coronavirus 2 NEGATIVE NEGATIVE Final    Comment: (NOTE) SARS-CoV-2 target nucleic acids are NOT DETECTED.  The SARS-CoV-2 RNA is generally detectable in upper and lower respiratory specimens during the acute phase of infection. Negative results do not preclude SARS-CoV-2 infection, do not rule out co-infections with other pathogens, and should not be used as the sole basis for treatment or other patient management decisions. Negative results must be combined with clinical observations, patient history, and epidemiological information. The expected result is Negative.  Fact Sheet for Patients: SugarRoll.be  Fact Sheet for Healthcare  Providers: https://www.woods-mathews.com/  This test is not yet approved or cleared by the [place] FDA and  has been authorized for detection and/or diagnosis of SARS-CoV-2 by FDA under an Emergency Use Authorization (EUA). This EUA will remain  in effect (meaning this test can be used) for the duration of the COVID-19 declaration under Se ction 564(b)(1) of the Act, 21 U.S.C. section 360bbb-3(b)(1), unless the authorization is terminated or revoked sooner.  Performed at [facility], [place] [address] [place], [place] [address]          Radiology Studies: DG Chest 1 View  Result Date: 07/19/2020 CLINICAL DATA:  Low oxygen saturation EXAM: CHEST  1 VIEW COMPARISON:  05/14/2020, 07/18/2020 FINDINGS: Shallow inspiration with atelectasis in the lung bases. Scarring or atelectasis in the medial left upper lung, similar to prior study. Cardiac enlargement. No definite consolidation or edema. Subcutaneous emphysema in the left breast and left chest wall. Probable developing pleural effusions. IMPRESSION: 1. Shallow inspiration with atelectasis in the lung bases. 2. Developing bilateral pleural effusions. 3. Subcutaneous emphysema in the left breast and left chest wall. Electronically Signed   By: Lucienne Capers M.D.   On: 07/19/2020 05:02   CT HEAD WO CONTRAST  Result Date: 07/19/2020 CLINICAL DATA:  Mental status change, unknown cause. EXAM: CT HEAD WITHOUT CONTRAST TECHNIQUE: Contiguous axial images were obtained from the base of the skull through the vertex without intravenous contrast. COMPARISON:  Head CT 06/03/2020.  Brain MRI 02/20/2020. FINDINGS: Brain: Cerebral volume is normal for age. There is no acute intracranial hemorrhage. No demarcated cortical infarct. No extra-axial fluid collection. No evidence of intracranial mass. No midline shift. Partially empty sella turcica. Vascular: No hyperdense vessel. Skull: Normal. Negative for fracture or focal lesion.  Sinuses/Orbits: Visualized orbits show no acute finding. No significant paranasal sinus disease at the imaged levels. 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 IMPRESSION: No evidence of acute intracranial abnormality. Redemonstrated partially empty sella turcica. This finding is very commonly incidental, but can be associated with idiopathic intracranial hypertension. Incidentally noted advanced left TMJ osteoarthrosis. Electronically Signed   By: Kellie Simmering DO   On: 07/19/2020 12:26        Scheduled Meds: . acetaminophen  500 mg Oral Q6H  . vitamin C  500 mg Oral QHS  . aspirin EC  81  mg Oral Daily  . B-complex with vitamin C  1 tablet Oral QHS  . Chlorhexidine Gluconate Cloth  6 each Topical Daily  . cholecalciferol  1,000 Units Oral Daily  . enoxaparin (LOVENOX) injection  40 mg Subcutaneous Q24H  . escitalopram  30 mg Oral Daily  . feeding supplement (GLUCERNA SHAKE)  237 mL Oral BID BM  . furosemide  40 mg Oral BID  . insulin aspart  0-15 Units Subcutaneous TID WC  . insulin aspart  0-5 Units Subcutaneous QHS  . lamoTRIgine  150 mg Oral BID  . lip balm  1 application Topical BID  . nadolol  10 mg Oral Daily  . pantoprazole  40 mg Oral Daily  . [START ON 07/21/2020] predniSONE  10 mg Oral Q breakfast  . psyllium  1 packet Oral BID  . rosuvastatin  5 mg Oral Daily  . sodium chloride flush  10-40 mL Intracatheter Q12H  . traZODone  200 mg Oral QHS   Continuous Infusions: . methocarbamol (ROBAXIN) IV       LOS: 3 days    Time spent: 30 minutes    Barb Merino, MD [facility] Pager [phone]

## 2020-07-20 NOTE — TOC Initial Note (Signed)
Transition of Care [facility]) - Initial/Assessment Note    Patient Details  Name: Valerie [name] MRN: [id number] Date of Birth: 1961-05-18  [facility] CM/SW Contact:    [address], [place] Phone Number: 07/20/2020, 2:22 PM  Clinical Narrative:                 CSW called pt's phone to initiate assessment for possible SNF placement. Phone was answered by pt's daughter, Valerie [name]. Valerie [name] states that she believes it is best for pt to complete short term rehab at a SNF. Valerie [name] states that pt lives with a roommate and uses a walker. Valerie [name] states pt will have support once she returns from SNF. Pt lives in the [place] area but [place] request SNF be located in the [place] area. Valerie [name] states pt is partially vaccinated. CSW explained SNF ratings list on Medicare.gov and Biomedical engineer process. CSW will continue to follow for dc planning needs.         Patient Goals and CMS Choice        Expected Discharge Plan and Services                                                Prior Living Arrangements/Services                       Activities of Daily Living Home Assistive Devices/Equipment: None ADL Screening (condition at time of admission) Patient's cognitive ability adequate to safely complete daily activities?: Yes Is the patient deaf or have difficulty hearing?: No Does the patient have difficulty seeing, even when wearing glasses/contacts?: No Does the patient have difficulty concentrating, remembering, or making decisions?: No Patient able to express need for assistance with ADLs?: Yes Does the patient have difficulty dressing or bathing?: No Independently performs ADLs?: Yes (appropriate for developmental age) Does the patient have difficulty walking or climbing stairs?: No Weakness of Legs: None Weakness of Arms/Hands: None  Permission Sought/Granted                  Emotional Assessment              Admission  diagnosis:  Colovesical fistula [N32.1] Patient Active Problem List   Diagnosis Date Noted  . Hypoxemia 07/20/2020  . Altered mental status 07/19/2020  . Acute respiratory failure with hypoxemia ([place]) 07/19/2020  . Anemia, chronic disease 07/18/2020  . 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 Protein-calorie malnutrition, moderate ([place]) 06/05/2020  . Weakness 06/04/2020  . CAD (coronary artery disease) 06/04/2020  . Macrocytic anemia 06/04/2020  . 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 Intra-abdominal infection   . NSTEMI (non-ST elevated myocardial infarction) ([place]) 11/14/2019  . DM type 2 (diabetes mellitus, type 2) ([place]) 11/14/2019  . Depression   . Anxiety   . Chest pain   . Obesity, Class III, BMI 40-49.9 (morbid obesity) ([place])   . CKD (chronic kidney disease), stage III ([place])   . Psoriatic arthritis ([place])   . Tobacco abuse   . OSA (obstructive sleep apnea)   . Neuropathy   . Bipolar disorder ([place])   . Osteoarthritis of right hip 11/05/2014  . Essential hypertension 10/29/2014   PCP:  Ophelia Shoulder, MD Pharmacy:   [facility] [place], [address] [place] [address] Phone: [phone] Fax: [phone]     Social Determinants of Health (SDOH) Interventions    Readmission Risk Interventions No flowsheet data found.

## 2020-07-20 NOTE — Progress Notes (Signed)
Physical Therapy Treatment Patient Details Name: Valerie [name] MRN: [id number] DOB: 1960/10/15 Today's Date: 07/20/2020    History of Present Illness 59 year old female who presents with colovesical fistula, s/p rectosigmoid resection and fistula repair 07/17/20. PMH includes bipolar disorder, CAD, CKD, psoriatic arthritis, NSTEMI, B THA. Recent admission 10/11-10/21/21 for colovesical fistula and frequent falls.    PT Comments    Continues to progress toward goals. incr activity tolerance and gait distance however does fatigue quickly. Continue to recommend SNF    Follow Up Recommendations  SNF     Equipment Recommendations  None recommended by PT    Recommendations for Other Services       Precautions / Restrictions Precautions Precautions: Fall Precaution Comments: JP drain on R    Mobility  Bed Mobility Overal bed mobility: Needs Assistance Bed Mobility: Rolling;Sidelying to Sit Rolling: Min assist;Min guard Sidelying to sit: Min assist       General bed mobility comments: cues for sequence, assist to elevate trunk  Transfers Overall transfer level: Needs assistance Equipment used: Rolling walker (2 wheeled) Transfers: Sit to/from Stand Sit to Stand: Min assist         General transfer comment: cues for hand placement and to power up with LEs as well as control descent  Ambulation/Gait Ambulation/Gait assistance: Min assist;+2 safety/equipment Gait Distance (Feet): 18 Feet Assistive device: Rolling walker (2 wheeled) Gait Pattern/deviations: Step-through pattern;Decreased stride length     General Gait Details: assist for safety and balance, cues for task sequence. chair follow for safety , pt continues to fatigue quickly. SpO2=85% on RA with activity. replaced O2 at 4.5L, SpO2 = 90s   Stairs             Wheelchair Mobility    Modified Rankin (Stroke Patients Only)       Balance                                             Cognition Arousal/Alertness: Awake/alert (somewhat sleepy, arouses easily today ) Behavior During Therapy: WFL for tasks assessed/performed Overall Cognitive Status: Within Functional Limits for tasks assessed                                        Exercises      General Comments        Pertinent Vitals/Pain Pain Assessment: No/denies pain    Home Living                      Prior Function            PT Goals (current goals can now be found in the care plan section) Acute Rehab PT Goals Patient Stated Goal: go to rehab PT Goal Formulation: With patient/family Time For Goal Achievement: 08/01/20 Potential to Achieve Goals: Good Progress towards PT goals: Progressing toward goals    Frequency    Min 2X/week      PT Plan Current plan remains appropriate    Co-evaluation              AM-PAC PT "6 Clicks" Mobility   Outcome Measure  Help needed turning from your back to your side while in a flat bed without using bedrails?: A Little Help needed moving from lying on your  back to sitting on the side of a flat bed without using bedrails?: A Little Help needed moving to and from a bed to a chair (including a wheelchair)?: A Little Help needed standing up from a chair using your arms (e.g., wheelchair or bedside chair)?: A Little Help needed to walk in hospital room?: A Little Help needed climbing 3-5 steps with a railing? : A Lot 6 Click Score: 17    End of Session Equipment Utilized During Treatment: Gait belt Activity Tolerance: Patient tolerated treatment well Patient left: in chair;with call bell/phone within reach;with family/visitor present;with chair alarm set   PT Visit Diagnosis: Unsteadiness on feet (R26.81)     Time: 2182-8833 PT Time Calculation (min) (ACUTE ONLY): 16 min  Charges:  $Gait Training: 8-22 mins                     Baxter Flattery, PT  Acute Rehab Dept ([place]) [phone] Pager  [phone]  07/20/2020    Kenyon Ana 07/20/2020, 2:20 PM

## 2020-07-20 NOTE — Progress Notes (Signed)
CSW received a call from pt's RN Caryl Pina at [phone] stating pt/pt's family is wanting SNF and PT recommends SNF, per the chart.  Per the provider, pt may need a "post-surgical work-up" and the specialist has been consulted via Epic messaging as to the estimated LOS.  CSW will continue to follow for D/C needs.  Alphonse Guild. Ryiah Bellissimo  MSW, LCSW, LCAS, CCS Transitions of Care Clinical Social Worker Care Coordination Department Ph: [phone]

## 2020-07-20 NOTE — Progress Notes (Signed)
Pharmacy Brief Note - Alvimopan (Entereg)  The standing order set for alvimopan (Entereg) now includes an automatic order to discontinue the drug after the patient has had a bowel movement. The change was approved by the [facility] and the Medical Executive Committee.   This patient has had bowel movements documented by nursing. Therefore, alvimopan has been discontinued. If there are questions, please contact the pharmacy at [phone].   Thank you-  Dia Sitter, PharmD, BCPS 07/20/2020 11:23 AM

## 2020-07-20 NOTE — Progress Notes (Signed)
CSW called pt's phone at ph: [phone] and was unable to reach her.  CSW will continue to follow for D/C needs.  Alphonse Guild. Orene Abbasi  MSW, LCSW, LCAS, CCS Transitions of Care Clinical Social Worker Care Coordination Department Ph: [phone]

## 2020-07-20 NOTE — [place] FL2 (Signed)
[place] MEDICAID FL2 LEVEL OF CARE SCREENING TOOL     IDENTIFICATION  Patient Name: Valerie [name] Birthdate: Jan 19, 1961 Sex: female Admission Date (Current Location): 07/17/2020  [facility] and [place] Number:  Herbalist and Address:  [facility],  [place] [address] [place]      Provider Number: [id number]  Attending Physician Name and Address:  Barb Merino, MD  Relative Name and Phone Number:  Dorothye Berni [phone]    Current Level of Care: Hospital Recommended Level of Care: [place] Prior Approval Number:    Date Approved/Denied:   PASRR Number: pending  Discharge Plan: SNF    Current Diagnoses: Patient Active Problem List   Diagnosis Date Noted  . Hypoxemia 07/20/2020  . Altered mental status 07/19/2020  . Acute respiratory failure with hypoxemia ([place]) 07/19/2020  . Anemia, chronic disease 07/18/2020  . 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 Protein-calorie malnutrition, moderate ([place]) 06/05/2020  . Weakness 06/04/2020  . CAD (coronary artery disease) 06/04/2020  . Macrocytic anemia 06/04/2020  . 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[place]) 05/20/2020  . Intra-abdominal infection   . NSTEMI (non-ST elevated myocardial infarction) ([place]) 11/14/2019  . DM type 2 (diabetes mellitus, type 2) ([place]) 11/14/2019  . Depression   . Anxiety   . Chest pain   . Obesity, Class III, BMI 40-49.9 (morbid obesity) ([place])   . CKD (chronic kidney disease), stage III ([place])   . Psoriatic arthritis ([place])   . Tobacco abuse   . OSA (obstructive sleep apnea)   . Neuropathy   . Bipolar disorder ([place])   . Osteoarthritis of right hip 11/05/2014  . Essential hypertension 10/29/2014    Orientation RESPIRATION BLADDER Height & Weight     Self, Time, Situation, Place  O2 ([place] 5 Liters) Continent, External catheter Weight: 224 lb  13.9 oz (102 kg) Height:  5\' 2"  (157.5 cm)  BEHAVIORAL SYMPTOMS/MOOD NEUROLOGICAL BOWEL NUTRITION STATUS      Incontinent Diet (see dc summary)  AMBULATORY STATUS COMMUNICATION OF NEEDS Skin   Extensive Assist Verbally Surgical wounds (Closed incision 07/17/20 Abdomen)                       Personal Care Assistance Level of Assistance  Bathing, Feeding, Dressing Bathing Assistance: Limited assistance Feeding assistance: Independent Dressing Assistance: Limited assistance     Functional Limitations Info  Sight, Hearing, Speech Sight Info: Adequate Hearing Info: Adequate Speech Info: Adequate    SPECIAL CARE FACTORS FREQUENCY  PT (By licensed PT), OT (By licensed OT)     PT Frequency: 5x week OT Frequency: 5x week            Contractures Contractures Info: Not present    Additional Factors Info  Code Status, Allergies, Psychotropic, Insulin Sliding Scale Code Status Info: Full Allergies Info: Carbamazepine Sertraline Hcl Psychotropic Info: traZODone (DESYREL) tablet 200 mg at bedtime Insulin Sliding Scale Info: insulin aspart (novoLOG) injection 0-15 Units 3x daily with meals, insulin aspart (novoLOG) injection 0-5 Units at bedtime       Current Medications (07/20/2020):  This is the current hospital active medication list Current Facility-Administered Medications  Medication Dose Route Frequency Provider Last Rate Last Admin  . acetaminophen (TYLENOL) tablet 500 mg  500 mg Oral Lajuana Ripple, MD   500 mg at 07/20/20 1216  . ALPRAZolam (XANAX) tablet 1 mg  1 mg  Oral TID PRN Michael Boston, MD   1 mg at 07/18/20 0526  . alum & mag hydroxide-simeth (MAALOX/MYLANTA) 200-200-20 MG/5ML suspension 30 mL  30 mL Oral Q6H PRN Michael Boston, MD      . ascorbic acid (VITAMIN C) tablet 500 mg  500 mg Oral Ardeen Fillers, MD   500 mg at 07/19/20 2153  . aspirin EC tablet 81 mg  81 mg Oral Daily Michael Boston, MD   81 mg at 07/20/20 0801  . B-complex with vitamin C  tablet 1 tablet  1 tablet Oral Ardeen Fillers, MD   1 tablet at 07/19/20 2159  . Chlorhexidine Gluconate Cloth 2 % PADS 6 each  6 each Topical Daily Michael Boston, MD   6 each at 07/20/20 0805  . cholecalciferol (VITAMIN D) tablet 1,000 Units  1,000 Units Oral Daily Michael Boston, MD   1,000 Units at 07/20/20 0801  . enalaprilat (VASOTEC) injection 0.625-1.25 mg  0.625-1.25 mg Intravenous Q6H PRN Michael Boston, MD      . enoxaparin (LOVENOX) injection 40 mg  40 mg Subcutaneous Q24H Michael Boston, MD   40 mg at 07/20/20 0802  . escitalopram (LEXAPRO) tablet 30 mg  30 mg Oral Daily Michael Boston, MD   30 mg at 07/20/20 0800  . feeding supplement (GLUCERNA SHAKE) (GLUCERNA SHAKE) liquid 237 mL  237 mL Oral BID BM Michael Boston, MD   237 mL at 07/20/20 1320  . fentaNYL (SUBLIMAZE) injection 25-50 mcg  25-50 mcg Intravenous Q2H PRN Michael Boston, MD      . furosemide (LASIX) tablet 40 mg  40 mg Oral BID Michael Boston, MD   40 mg at 07/20/20 0800  . HYDROcodone-acetaminophen (NORCO) 10-325 MG per tablet 0.5-1 tablet  0.5-1 tablet Oral Q6H PRN Michael Boston, MD      . insulin aspart (novoLOG) injection 0-15 Units  0-15 Units Subcutaneous TID WC Gross, Remo Lipps, MD      . insulin aspart (novoLOG) injection 0-5 Units  0-5 Units Subcutaneous QHS Michael Boston, MD      . lamoTRIgine (LAMICTAL) tablet 150 mg  150 mg Oral BID Michael Boston, MD   150 mg at 07/20/20 1015  . lip balm (CARMEX) ointment 1 application  1 application Topical BID Michael Boston, MD   1 application at 75/64/33 0805  . magic mouthwash  15 mL Oral QID PRN Michael Boston, MD      . methocarbamol (ROBAXIN) 1,000 mg in dextrose 5 % 100 mL IVPB  1,000 mg Intravenous Q6H PRN Michael Boston, MD      . methocarbamol (ROBAXIN) tablet 1,000 mg  1,000 mg Oral Q6H PRN Michael Boston, MD      . metoprolol tartrate (LOPRESSOR) injection 5 mg  5 mg Intravenous Q6H PRN Michael Boston, MD      . nadolol (CORGARD) tablet 10 mg  10 mg Oral Daily Michael Boston,  MD   10 mg at 07/20/20 1014  . nitroGLYCERIN (NITROSTAT) SL tablet 0.4 mg  0.4 mg Sublingual Q5 min PRN Michael Boston, MD      . ondansetron [facility]) tablet 4 mg  4 mg Oral Q6H PRN Michael Boston, MD       Or  . ondansetron (ZOFRAN) injection 4 mg  4 mg Intravenous Q6H PRN Michael Boston, MD      . pantoprazole (PROTONIX) EC tablet 40 mg  40 mg Oral Daily Michael Boston, MD   40 mg at 07/20/20 1014  . [START ON 07/21/2020] predniSONE (  DELTASONE) tablet 10 mg  10 mg Oral Q breakfast Michael Boston, MD      . prochlorperazine (COMPAZINE) tablet 10 mg  10 mg Oral Q6H PRN Michael Boston, MD       Or  . prochlorperazine (COMPAZINE) injection 5-10 mg  5-10 mg Intravenous Q6H PRN Michael Boston, MD      . psyllium (HYDROCIL/METAMUCIL) 1 packet  1 packet Oral BID Michael Boston, MD   1 packet at 07/19/20 2153  . rosuvastatin (CRESTOR) tablet 5 mg  5 mg Oral Daily Michael Boston, MD   5 mg at 07/20/20 1016  . sodium chloride flush (NS) 0.9 % injection 10-40 mL  10-40 mL Intracatheter Gorden Harms, MD   10 mL at 07/20/20 0806  . sodium chloride flush (NS) 0.9 % injection 10-40 mL  10-40 mL Intracatheter PRN Michael Boston, MD      . traZODone (DESYREL) tablet 200 mg  200 mg Oral Ardeen Fillers, MD   200 mg at 07/18/20 2208     Discharge Medications: Please see discharge summary for a list of discharge medications.  Relevant Imaging Results:  Relevant Lab Results:   Additional Information    Raneen Jaffer B Edenilson Austad, LCSWA

## 2020-07-20 NOTE — Progress Notes (Addendum)
Valerie [name] [id number] [birth year]  CARE TEAM:  PCP: Ophelia Shoulder, MD  Outpatient Care Team: Patient Care Team: Ophelia Shoulder, MD as PCP - General (Internal Medicine) [name], MD as PCP - Cardiology (Cardiology) Janith Lima, MD as Consulting Physician (Urology) Michael Boston, MD as Consulting Physician (Colon and Rectal Surgery) Carol Ada, MD as Consulting Physician (Gastroenterology) [name], MD as Consulting Physician (Cardiology)  Inpatient Treatment Team: Treatment Team: Attending Provider: Barb Merino, MD; Technician: Ernest Mallick, NT; Consulting Physician: Harold Hedge, MD; Rounding Team: Catarina Hartshorn, MD; Technician: Lewanda Rife, NT; Registered Nurse: Sunny Schlein, RN; Consulting Physician: Mariel Aloe, MD; Utilization Review: Alease Medina, RN; Rounding Team: Suzan Garibaldi, MD; Technician: Stamps, Lawanna Kobus, [place]; Physical Therapist: Neil Crouch, PT; Registered Nurse: Donia Ast, RN   Problem List:   Principal Problem:   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 Active Problems:   Psoriatic arthritis ([place])   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   Anxiety   Obesity, Class III, BMI 40-49.9 (morbid obesity) ([place])   CKD (chronic kidney disease), stage III (HCC)   OSA (obstructive sleep apnea)   Bipolar disorder ([place])   DM type 2 (diabetes mellitus, type 2) ([place])   CAD (coronary artery disease)   Protein-calorie malnutrition, moderate (HCC)   Anemia, chronic disease   Altered mental status   Acute respiratory failure with hypoxemia ([place])   3 Days Post-Op    07/17/2020  POST-OPERATIVE DIAGNOSIS:  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 Diverticulitis with colovesical fistula  Postoperative diagnosis:  1. Diverticulitis with colovesical fistula  Procedure: 1. Cystoscopy 2. Bilateral ureteral cannulation and injection of indocyanine green dye  Surgeon: Pryor Curia. M.D.  Assessment  SLOWLY IMPROVING  [facility] Stay = 3 days)  Plan:  [facility] internal medicine help appreciated  Heart healthy solid diet as tolerated-  Mental status seems improved to me.  Still little groggy but more alert.  Nursing agrees.  Defer to medicine and neurology with MRI to make sure no other issues.  Pain control.  Holding gabapentin given grogginess.  Low-dose Tylenol seems to be adequate.  Patient used to hydrocodone to use for p.o.  Hypoxia most likely related to pulmonary issues despite try to keep on the dry side.  Lasix responsive so will place on scheduled Lasix for 3 more doses and follow creatinine.   Hypoxia on oxygen.  Able to go down from 5 L to 3 L.  Hopefully can wean further versus need for home health oxygen.  We will see  Follow-up on pathology  Foley catheter for at least 7 days and then cystogram on postop day 7.  Low threshold to carry further out or have urology reinvolved if delayed healing.  She is high risk for this given her chronic immunosuppression and large fistula requiring 2 layer closure and omentopexy.  Keep surgical drain as well.  Most likely will send urine for creatinine on postop day 7 to rule out any urine leak as well  Acute on chronic anemia.  IV iron given 11/25.  Check anemia panel since she seems to be macrocytic to rule out any folate  and vitamin B12 or other deficiencies  Keep on low-dose steroids for now to minimize anastomotic healing issues while the same time preventing debilitating psoriatic arthritis.  Discussed with internal medicine.  Try to  avoid bolusing high-dose steroids in order to minimize anastomotic leak risks unless would be of benefit from a pulmonary standpoint.  Cortisol somewhat low.  Can place on 10 mg daily for now (up from 5/10 alternating).  Severe psoriatic arthritis.  Usually on Enbrel.  Can consider restarting that around 3 weeks out from surgery on side outside window of possible leak or abscess.  Hopefully could wean off steroids as well  Pain control.  Hypercholesterolemia controlled  GERD control  CPAP for sleep apnea  Borderline diabetes.  SSI & follow  VTE prophylaxis- SCDs, etc  -mobilize as tolerated to help recovery.  She is already rather deconditioned.  Hopefully can work with therapy when she is more alert.    Initial impression patient would benefit from skilled nursing facility for more aggressive care and daily rehab.  We will get transitional care team involved given her medical complexity and deconditioned state.  Perhaps she would be ready to discharge in a few days, Monday if improves.  We will see.  40 minutes spent in review, evaluation, examination, counseling, and coordination of care.  More than 50% of that time was spent in counseling.  07/20/2020    Subjective: (Chief complaint)  Patient feeling more alert.  Nursing agrees.  Tolerating solid diet.  CT scan of brain yesterday argued against any acute issues.  MRI pending.  Patient still requiring oxygen.  Did diurese with Lasix.  Starting to be able to stand and bear weight but still very deconditioned    Objective:  Vital signs:  Vitals:   07/19/20 1420 07/19/20 2112 07/20/20 0500 07/20/20 0639  BP: 139/68 (!) 141/72  (!) 148/86  Pulse: 85 73  75  Resp: 20 20  17   Temp: 98.9 F (37.2 C) 99.5 F (37.5 C)  98.5 F (36.9 C)  TempSrc: Oral Oral  Oral  SpO2: 95% 96%  94%  Weight:   102 kg   Height:        Last BM Date: 07/17/20  Intake/Output   Yesterday:  11/26 0701 - 11/27 0700 In: 370  [P.O.:370] Out: 4008 [Urine:3625; Drains:124] This shift:  No intake/output data recorded.  Bowel function:  Flatus: YES  BM:  YES  Drain: Serosanguinous   Physical Exam:  General: Pt awake/alert in no acute distress.  Somewhat groggy but more alert today.  Joking. Eyes: PERRL, normal EOM.  Sclera clear.  No icterus Neuro: CN II-XII intact w/o focal sensory/motor deficits. Lymph: No head/neck/groin lymphadenopathy Psych:  No delerium/psychosis/paranoia.  Oriented x 4 HENT: Normocephalic, Mucus membranes moist.  No thrush Neck: Supple, No tracheal deviation.  No obvious thyromegaly Chest: No pain to chest wall compression.  Good respiratory excursion.  No audible wheezing CV:  Pulses intact.  Regular rhythm.  No major extremity edema MS: Normal AROM mjr joints.  No obvious deformity  Abdomen: Soft.  Nondistended.  Mildly tender at incisions only.  Dressings clean dry and intact no evidence of peritonitis.  No incarcerated hernias.  GU: Pfannenstiel dressing dry.  Foley in place with clear dark yellow urine. Ext:  No deformity.  No mjr edema.  No cyanosis Skin: No petechiae / purpurea.  No major sores.  Warm and dry    Results:   Cultures: Recent Results (from the past 720 hour(s))  SARS CORONAVIRUS 2 (TAT 6-24 HRS) Nasopharyngeal Nasopharyngeal Swab     Status: None   Collection Time: 07/13/20 10:40 AM   Specimen: Nasopharyngeal Swab  Result Value Ref Range Status   SARS Coronavirus 2 NEGATIVE NEGATIVE Final    Comment: (NOTE) SARS-CoV-2 target nucleic acids are NOT DETECTED.  The SARS-CoV-2 RNA is generally detectable in upper and lower respiratory specimens during the acute phase of infection. Negative results do not preclude SARS-CoV-2 infection, do not rule out co-infections with other pathogens, and should not be used as the sole basis for treatment or other patient management decisions. Negative results must be combined with clinical observations, patient  history, and epidemiological information. The expected result is Negative.  Fact Sheet for Patients: SugarRoll.be  Fact Sheet for Healthcare Providers: https://www.woods-mathews.com/  This test is not yet approved or cleared by the [place] FDA and  has been authorized for detection and/or diagnosis of SARS-CoV-2 by FDA under an Emergency Use Authorization (EUA). This EUA will remain  in effect (meaning this test can be used) for the duration of the COVID-19 declaration under Se ction 564(b)(1) of the Act, 21 U.S.C. section 360bbb-3(b)(1), unless the authorization is terminated or revoked sooner.  Performed at [facility], [place] [address] [place], [place] [address]     Labs: Results for orders placed or performed during the hospital encounter of 07/17/20 (from the past 48 hour(s))  Vitamin B12     Status: Abnormal   Collection Time: 07/18/20  9:55 AM  Result Value Ref Range   Vitamin B-12 1,723 (H) 180 - 914 pg/mL    Comment: RESULTS CONFIRMED BY MANUAL DILUTION (NOTE) This assay is not validated for testing neonatal or myeloproliferative syndrome specimens for Vitamin B12 levels. Performed at [facility], [place] [address] [place], [place] [address]   Folate     Status: None   Collection Time: 07/18/20  9:55 AM  Result Value Ref Range   Folate 9.3 >5.9 ng/mL    Comment: Performed at [facility], [place] [address], [place], [place] [address]  Iron and TIBC     Status: Abnormal   Collection Time: 07/18/20  9:55 AM  Result Value Ref Range   Iron 12 (L) 28 - 170 ug/dL   TIBC 225 (L) 250 - 450 ug/dL   Saturation Ratios 5 (L) 10.4 - 31.8 %   UIBC 213 ug/dL    Comment: Performed at [facility], [place] [address], [place], [place] [address]  Ferritin     Status: None   Collection Time: 07/18/20  9:55 AM  Result Value Ref Range   Ferritin 84 11 - 307 ng/mL    Comment:  Performed at [facility], [place] [address], [place], [place] [address]  Reticulocytes     Status: Abnormal   Collection Time: 07/18/20  9:55 AM  Result Value Ref Range   Retic Ct Pct 3.1 0.4 - 3.1 %   RBC. 2.51 (L) 3.87 - 5.11 MIL/uL   Retic Count, Absolute 77.8 19.0 - 186.0 K/uL   Immature Retic Fract 22.2 (H) 2.3 - 15.9 %    Comment: Performed at [facility], [place] [address], [place], [place] [address]  Prealbumin     Status: Abnormal   Collection Time: 07/18/20  9:55 AM  Result Value Ref Range   Prealbumin 11.4 (L) 18 - 38 mg/dL    Comment: Performed at [facility], [place]  [address] [place], [place] [address]  Phosphorus     Status: None   Collection Time: 07/18/20  9:55 AM  Result Value Ref Range   Phosphorus 3.1 2.5 - 4.6 mg/dL    Comment: Performed at [facility], [place] [address], [place], [place] [address]  Blood gas, arterial     Status: Abnormal   Collection Time: 07/19/20  5:25 AM  Result Value Ref Range   FIO2 40.00    O2 Content 5.0 L/min   Delivery systems NASAL CANNULA    pH, Arterial 7.395 7.35 - 7.45   pCO2 arterial 47.8 32 - 48 mmHg   pO2, Arterial 69.8 (L) 83 - 108 mmHg   Bicarbonate 28.6 (H) 20.0 - 28.0 mmol/L   Acid-Base Excess 3.4 (H) 0.0 - 2.0 mmol/L   O2 Saturation 93.7 %   Patient temperature 98.6    Collection site LEFT BRACHIAL    Drawn by [id number]     Comment: Performed at [place] [address] [place], [place] [address]  CBC     Status: Abnormal   Collection Time: 07/19/20  5:43 AM  Result Value Ref Range   WBC 14.2 (H) 4.0 - 10.5 K/uL   RBC 2.60 (L) 3.87 - 5.11 MIL/uL   Hemoglobin 8.6 (L) 12.0 - 15.0 g/dL   HCT 27.8 (L) 36 - 46 %   MCV 106.9 (H) 80.0 - 100.0 fL   MCH 33.1 26.0 - 34.0 pg   MCHC 30.9 30.0 - 36.0 g/dL   RDW 13.5 11.5 - 15.5 %   Platelets 198 150 - 400 K/uL   nRBC 0.0 0.0 - 0.2 %    Comment: Performed at [facility], [place] [address] [place], [place] [address]  Potassium     Status: None   Collection Time: 07/19/20  5:43 AM  Result Value Ref Range   Potassium 4.2 3.5 - 5.1 mmol/L    Comment: Performed at [facility], [place] [address] [place], [place] [address]  Creatinine, serum     Status: Abnormal   Collection Time: 07/19/20  5:43 AM  Result Value Ref Range   Creatinine, Ser 1.50 (H) 0.44 - 1.00 mg/dL   GFR, Estimated 40 (L) >60 mL/min    Comment: (NOTE) Calculated using the CKD-EPI Creatinine Equation (2021) Performed at [facility], [place] [address], [place], [place] [address]   Basic metabolic panel     Status: Abnormal   Collection Time: 07/19/20  5:43 AM  Result Value Ref Range   Sodium 141 135 - 145 mmol/L   Potassium 4.4 3.5 - 5.1 mmol/L   Chloride 108 98 - 111 mmol/L   CO2 27 22 - 32 mmol/L   Glucose, Bld 89 70 - 99 mg/dL    Comment: Glucose reference range applies only to samples taken after fasting for at least 8 hours.   BUN 22 (H) 6 - 20 mg/dL   Creatinine, Ser 1.56 (H) 0.44 - 1.00 mg/dL   Calcium 11.6 (H) 8.9 - 10.3 mg/dL   GFR, Estimated 38 (L) >60 mL/min    Comment: (NOTE) Calculated using the CKD-EPI Creatinine Equation (2021)    Anion gap 6 5 - 15    Comment: Performed at [facility] [place] [address], [place],  [address]  Comprehensive metabolic panel     Status: Abnormal   Collection Time: 07/19/20  9:06 AM  Result Value Ref Range   Sodium 138 135 -  145 mmol/L   Potassium 4.1 3.5 - 5.1 mmol/L   Chloride 104 98 - 111 mmol/L   CO2 27 22 - 32 mmol/L   Glucose, Bld 97 70 - 99 mg/dL    Comment: Glucose reference range applies only to samples taken after fasting for at least 8 hours.   BUN 21 (H) 6 - 20 mg/dL   Creatinine, Ser 1.56 (H) 0.44 - 1.00 mg/dL   Calcium 11.7 (H) 8.9 - 10.3 mg/dL   Total Protein 6.1 (L) 6.5 - 8.1 g/dL   Albumin 2.8 (L) 3.5 - 5.0 g/dL   AST 17 15 - 41 U/L   ALT 14 0 - 44  U/L   Alkaline Phosphatase 65 38 - 126 U/L   Total Bilirubin 0.9 0.3 - 1.2 mg/dL   GFR, Estimated 38 (L) >60 mL/min    Comment: (NOTE) Calculated using the CKD-EPI Creatinine Equation (2021)    Anion gap 7 5 - 15    Comment: Performed at [facility], [place] [address], [place], [place] [address]  Glucose, capillary     Status: None   Collection Time: 07/19/20 12:32 PM  Result Value Ref Range   Glucose-Capillary 98 70 - 99 mg/dL    Comment: Glucose reference range applies only to samples taken after fasting for at least 8 hours.  Glucose, capillary     Status: Abnormal   Collection Time: 07/19/20  5:14 PM  Result Value Ref Range   Glucose-Capillary 103 (H) 70 - 99 mg/dL    Comment: Glucose reference range applies only to samples taken after fasting for at least 8 hours.  TSH     Status: None   Collection Time: 07/19/20  7:02 PM  Result Value Ref Range   TSH 1.301 0.350 - 4.500 uIU/mL    Comment: Performed by a 3rd Generation assay with a functional sensitivity of <=0.01 uIU/mL. Performed at [facility], [place] [address] [place], [place] [address]   Ammonia     Status: None   Collection Time: 07/19/20  7:02 PM  Result Value Ref Range   Ammonia 28 9 - 35 umol/L    Comment: Performed at [facility], [place] [address] [place],  [address]  Glucose, capillary     Status: None   Collection Time: 07/19/20 10:23 PM  Result Value Ref Range   Glucose-Capillary 97 70 - 99 mg/dL    Comment: Glucose reference range applies only to samples taken after fasting for at least 8 hours.  Magnesium     Status: None   Collection Time: 07/20/20  3:49 AM  Result Value Ref Range   Magnesium 1.9 1.7 - 2.4 mg/dL    Comment: Performed at [facility], [place] [address] [place],  [address]  CBC     Status: Abnormal   Collection Time: 07/20/20  3:49 AM  Result Value Ref Range   WBC 13.4 (H) 4.0 - 10.5 K/uL   RBC 2.61  (L) 3.87 - 5.11 MIL/uL   Hemoglobin 8.4 (L) 12.0 - 15.0 g/dL   HCT 27.2 (L) 36 - 46 %   MCV 104.2 (H) 80.0 - 100.0 fL   MCH 32.2 26.0 - 34.0 pg   MCHC 30.9 30.0 - 36.0 g/dL   RDW 13.6 11.5 - 15.5 %   Platelets 212 150 - 400 K/uL   nRBC 0.0 0.0 - 0.2 %    Comment: Performed at [facility], [place]  [address] [place], [place] [address]  Basic metabolic panel     Status: Abnormal   Collection Time: 07/20/20  3:49 AM  Result Value Ref Range   Sodium 140 135 - 145 mmol/L   Potassium 3.9 3.5 - 5.1 mmol/L   Chloride 101 98 - 111 mmol/L   CO2 31 22 - 32 mmol/L   Glucose, Bld 88 70 - 99 mg/dL    Comment: Glucose reference range applies only to samples taken after fasting for at least 8 hours.   BUN 19 6 - 20 mg/dL   Creatinine, Ser 1.33 (H) 0.44 - 1.00 mg/dL   Calcium 11.4 (H) 8.9 - 10.3 mg/dL   GFR, Estimated 46 (L) >60 mL/min    Comment: (NOTE) Calculated using the CKD-EPI Creatinine Equation (2021)    Anion gap 8 5 - 15    Comment: Performed at [facility], [place] [address], [place], [place] [address]  Glucose, capillary     Status: None   Collection Time: 07/20/20  7:22 AM  Result Value Ref Range   Glucose-Capillary 85 70 - 99 mg/dL    Comment: Glucose reference range applies only to samples taken after fasting for at least 8 hours.    Imaging / Studies: DG Chest 1 View  Result Date: 07/19/2020 CLINICAL DATA:  Low oxygen saturation EXAM: CHEST  1 VIEW COMPARISON:  05/14/2020, 07/18/2020 FINDINGS: Shallow inspiration with atelectasis in the lung bases. Scarring or atelectasis in the medial left upper lung, similar to prior study. Cardiac enlargement. No definite consolidation or edema. Subcutaneous emphysema in the left breast and left chest wall. Probable developing pleural effusions. IMPRESSION: 1. Shallow inspiration with atelectasis in the lung bases. 2. Developing bilateral pleural effusions. 3. Subcutaneous emphysema in the left breast and  left chest wall. Electronically Signed   By: Lucienne Capers M.D.   On: 07/19/2020 05:02   CT HEAD WO CONTRAST  Result Date: 07/19/2020 CLINICAL DATA:  Mental status change, unknown cause. EXAM: CT HEAD WITHOUT CONTRAST TECHNIQUE: Contiguous axial images were obtained from the base of the skull through the vertex without intravenous contrast. COMPARISON:  Head CT 06/03/2020.  Brain MRI 02/20/2020. FINDINGS: Brain: Cerebral volume is normal for age. There is no acute intracranial hemorrhage. No demarcated cortical infarct. No extra-axial fluid collection. No evidence of intracranial mass. No midline shift. Partially empty sella turcica. Vascular: No hyperdense vessel. Skull: Normal. Negative for fracture or focal lesion. Sinuses/Orbits: Visualized orbits show no acute finding. No significant paranasal sinus disease at the imaged levels. 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 IMPRESSION: No evidence of acute intracranial abnormality. Redemonstrated partially empty sella turcica. This finding is very commonly incidental, but can be associated with idiopathic intracranial hypertension. Incidentally noted advanced left TMJ osteoarthrosis. Electronically Signed   By: Kellie Simmering DO   On: 07/19/2020 12:26   DG CHEST PORT 1 VIEW  Result Date: 07/18/2020 CLINICAL DATA:  59 year old female with right PICC line. EXAM: PORTABLE CHEST 1 VIEW COMPARISON:  05/14/2020 FINDINGS: No cardiomegaly. Obscuration of the left superior mediastinum due to linear opacification. Interval insertion right upper extremity peripherally inserted central catheter with the catheter tip at the cavoatrial junction. No pleural effusion. No pneumothorax. The visualized skeletal structures are unremarkable. IMPRESSION: 1. Streaky linear opacity in the medial aspect left upper lobe. Differential diagnosis is broad, however after recent intubation, sequela of aspiration with possible superimposed infection is most  likely. Mucous plugging with left upper lobe atelectasis could appear similarly. Widening of the  mediastinum due to etiology such as mediastinal hemorrhage is least likely. If there is any clinical concern for mediastinal process, recommend CT chest. 2. Right upper extremity PICC line in place with the tip at the cavoatrial junction. These results will be called to the ordering clinician or representative by the Radiologist Assistant, and communication documented in the PACS or [employer]. Ruthann Cancer, MD [facility] Radiology Electronically Signed   By: Ruthann Cancer MD   On: 07/18/2020 11:38    Medications / Allergies: per chart  Antibiotics: Anti-infectives (From admission, onward)   Start     Dose/Rate Route Frequency Ordered Stop   07/17/20 2000  cefoTEtan (CEFOTAN) 2 g in sodium chloride 0.9 % 100 mL IVPB        2 g 200 mL/hr over 30 Minutes Intravenous Every 12 hours 07/17/20 1615 07/17/20 2359   07/17/20 1400  neomycin (MYCIFRADIN) tablet 1,000 mg  Status:  Discontinued       "And" Linked Group Details   1,000 mg Oral 3 times per day 07/17/20 0603 07/17/20 0609   07/17/20 1400  metroNIDAZOLE (FLAGYL) tablet 1,000 mg  Status:  Discontinued       "And" Linked Group Details   1,000 mg Oral 3 times per day 07/17/20 0603 07/17/20 0609   07/17/20 0615  cefoTEtan (CEFOTAN) 2 g in sodium chloride 0.9 % 100 mL IVPB        2 g 200 mL/hr over 30 Minutes Intravenous On call to O.R. 07/17/20 0603 07/17/20 6945        Note: Portions of this report may have been transcribed using voice recognition software. Every effort was made to ensure accuracy; however, inadvertent computerized transcription errors may be present.   Any transcriptional errors that result from this process are unintentional.    [name], MD, FACS, MASCRS Gastrointestinal and Minimally Invasive Surgery  [facility] [address], [place], [place] [address] [phone] Fax [phone] Main/Paging  CONTACT INFORMATION: Weekday (9AM-5PM) concerns: Call [facility] main office at [phone] Weeknight (5PM-9AM) or Weekend/Holiday concerns: Check [email or web address] for General Surgery [facility] coverage (Please, do not use SecureChat as it is not reliable communication to operating surgeons for immediate patient care)      07/20/2020  7:47 AM

## 2020-07-20 NOTE — TOC Progression Note (Signed)
Transition of Care [facility]) - Progression Note    Patient Details  Name: Geroldine Esquivias MRN: [id number] Date of Birth: [birth year]  Transition of Care [facility]) CM/SW [place], [place] Phone Number: 07/20/2020, 2:46 PM  Clinical Narrative:    RE: Latia Mataya Date of Birth:  12/07/1960 Date: 07/20/20   Please be advised that the above-named patient will require a short-term nursing home stay - anticipated 30 days or less for rehabilitation and strengthening.  The plan is for return home.   Expected Discharge Plan: Skilled Nursing Facility Barriers to Discharge: Ship broker, Continued Medical Work up  Expected Discharge Plan and Services Expected Discharge Plan: [place] In-house Referral: Clinical Social Work                                             Social Determinants of Health (SDOH) Interventions    Readmission Risk Interventions No flowsheet data found.

## 2020-07-21 LAB — CBC WITH DIFFERENTIAL/PLATELET
Abs Immature Granulocytes: 0.15 10*3/uL — ABNORMAL HIGH (ref 0.00–0.07)
Basophils Absolute: 0 10*3/uL (ref 0.0–0.1)
Basophils Relative: 0 %
Eosinophils Absolute: 0.3 10*3/uL (ref 0.0–0.5)
Eosinophils Relative: 2 %
HCT: 27.8 % — ABNORMAL LOW (ref 36.0–46.0)
Hemoglobin: 8.6 g/dL — ABNORMAL LOW (ref 12.0–15.0)
Immature Granulocytes: 1 %
Lymphocytes Relative: 8 %
Lymphs Abs: 1 10*3/uL (ref 0.7–4.0)
MCH: 32.2 pg (ref 26.0–34.0)
MCHC: 30.9 g/dL (ref 30.0–36.0)
MCV: 104.1 fL — ABNORMAL HIGH (ref 80.0–100.0)
Monocytes Absolute: 0.8 10*3/uL (ref 0.1–1.0)
Monocytes Relative: 7 %
Neutro Abs: 9.8 10*3/uL — ABNORMAL HIGH (ref 1.7–7.7)
Neutrophils Relative %: 82 %
Platelets: 246 10*3/uL (ref 150–400)
RBC: 2.67 MIL/uL — ABNORMAL LOW (ref 3.87–5.11)
RDW: 13.6 % (ref 11.5–15.5)
WBC: 12 10*3/uL — ABNORMAL HIGH (ref 4.0–10.5)
nRBC: 0 % (ref 0.0–0.2)

## 2020-07-21 LAB — GLUCOSE, CAPILLARY: Glucose-Capillary: 95 mg/dL (ref 70–99)

## 2020-07-21 LAB — BASIC METABOLIC PANEL
Anion gap: 8 (ref 5–15)
BUN: 16 mg/dL (ref 6–20)
CO2: 32 mmol/L (ref 22–32)
Calcium: 11.4 mg/dL — ABNORMAL HIGH (ref 8.9–10.3)
Chloride: 99 mmol/L (ref 98–111)
Creatinine, Ser: 1.24 mg/dL — ABNORMAL HIGH (ref 0.44–1.00)
GFR, Estimated: 50 mL/min — ABNORMAL LOW (ref >60)
Glucose, Bld: 82 mg/dL (ref 70–99)
Potassium: 3.4 mmol/L — ABNORMAL LOW (ref 3.5–5.1)
Sodium: 139 mmol/L (ref 135–145)

## 2020-07-21 LAB — PHOSPHORUS: Phosphorus: 1.9 mg/dL — ABNORMAL LOW (ref 2.5–4.6)

## 2020-07-21 LAB — MAGNESIUM: Magnesium: 1.8 mg/dL (ref 1.7–2.4)

## 2020-07-21 MED ORDER — NYSTATIN 100000 UNIT/GM EX POWD
Freq: Two times a day (BID) | CUTANEOUS | Status: DC
  Filled 2020-07-21: qty 15

## 2020-07-21 MED ORDER — FUROSEMIDE 40 MG PO TABS
40.0000 mg | ORAL_TABLET | Freq: Two times a day (BID) | ORAL | Status: DC
  Administered 2020-07-21 – 2020-07-23 (×5): 40 mg via ORAL
  Filled 2020-07-21 (×5): qty 1

## 2020-07-21 MED ORDER — POTASSIUM CHLORIDE CRYS ER 20 MEQ PO TBCR
40.0000 meq | EXTENDED_RELEASE_TABLET | Freq: Every day | ORAL | Status: AC
  Administered 2020-07-21 – 2020-07-23 (×3): 40 meq via ORAL
  Filled 2020-07-21 (×3): qty 2

## 2020-07-21 MED ORDER — POTASSIUM PHOSPHATES 15 MMOLE/5ML IV SOLN
20.0000 mmol | Freq: Once | INTRAVENOUS | Status: AC
  Administered 2020-07-21: 20 mmol via INTRAVENOUS
  Filled 2020-07-21: qty 6.67

## 2020-07-21 MED ORDER — MAGNESIUM OXIDE 400 (241.3 MG) MG PO TABS
400.0000 mg | ORAL_TABLET | Freq: Every day | ORAL | Status: DC
  Administered 2020-07-21 – 2020-07-23 (×3): 400 mg via ORAL
  Filled 2020-07-21 (×3): qty 1

## 2020-07-21 MED ORDER — FLUCONAZOLE 100 MG PO TABS
200.0000 mg | ORAL_TABLET | Freq: Once | ORAL | Status: AC
  Administered 2020-07-21: 200 mg via ORAL
  Filled 2020-07-21: qty 2

## 2020-07-21 NOTE — Progress Notes (Addendum)
Valerie [name] [id number] 03/13/1961  CARE TEAM:  PCP: Ophelia Shoulder, MD  Outpatient Care Team: Patient Care Team: Ophelia Shoulder, MD as PCP - General (Internal Medicine) [name], MD as PCP - Cardiology (Cardiology) Janith Lima, MD as Consulting Physician (Urology) Michael Boston, MD as Consulting Physician (Colon and Rectal Surgery) Carol Ada, MD as Consulting Physician (Gastroenterology) [name], MD as Consulting Physician (Cardiology)  Inpatient Treatment Team: Treatment Team: Attending Provider: Barb Merino, MD; Technician: Ernest Mallick, NT; Consulting Physician: Harold Hedge, MD; Consulting Physician: Mariel Aloe, MD; Rounding Team: Suzan Garibaldi, MD; Registered Nurse: Efraim Kaufmann, RN; Registered Nurse: Charlyne Petrin, RN; Occupational Therapist: Lavon Paganini, OT; Utilization Review: Alease Medina, RN   Problem List:   Principal Problem:   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 Active Problems:   Psoriatic arthritis ([place])   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   Anxiety   Obesity, Class III, BMI 40-49.9 (morbid obesity) ([place])   CKD (chronic kidney disease), stage III (HCC)   OSA (obstructive sleep apnea)   Bipolar disorder ([place])   DM type 2 (diabetes mellitus, type 2) ([place])   CAD (coronary artery disease)   Protein-calorie malnutrition, moderate (HCC)   Anemia, chronic disease   Altered mental status   Acute respiratory failure with hypoxemia ([place])   Hypoxemia   4 Days Post-Op    07/17/2020  POST-OPERATIVE DIAGNOSIS:  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 Diverticulitis with colovesical fistula  Postoperative diagnosis:  1. Diverticulitis with colovesical fistula  Procedure: 1. Cystoscopy 2. Bilateral ureteral cannulation and injection of indocyanine green dye  Surgeon: Pryor Curia. M.D.  Assessment  SLOWLY IMPROVING  [facility] Stay = 4 days)  Plan:  [facility] internal medicine help appreciated  Heart healthy solid diet as tolerated  Mental status seems improved to me.  Still little groggy but more alert.  Nursing agrees.  Defer to medicine and neurology - CT & MRI underwhelming a hopeful sign.  Pain control adequate.  Holding gabapentin given grogginess.  Low-dose Tylenol seems to be adequate.  Patient used to hydrocodone to use for p.o.  Hypoxia most likely related to pulmonary issues despite try to keep on the dry side.  Lasix responsive so will place on scheduled Lasix for 3 more doses and follow creatinine Able to go down from 5 L to 3 L.  Hopefully can wean further versus need for home health oxygen.  Could benefit from chest x-ray but will defer to medicine.   Hypokalemia most likely diuresis.  Replace orally and follow  Follow-up on pathology  Foley catheter for at least 7 days and then cystogram on postop day 7.  Low threshold to carry further out or have urology reinvolved if delayed healing.  She is high risk for this given her chronic immunosuppression and large fistula requiring 2 layer closure and omentopexy.  Keep surgical drain as well.  Most likely will send urine for creatinine on postop day 7 to rule out any urine leak as well  Acute on chronic anemia.  IV iron given 11/25.  Check anemia panel since she seems to be macrocytic to rule out any folate and  vitamin B12 or other deficiencies  Keep on low-dose steroids for now to minimize anastomotic healing issues while the same time preventing debilitating psoriatic arthritis.  Discussed with internal medicine.   Try to avoid bolusing high-dose steroids in order to minimize anastomotic leak risks unless would be of benefit from a pulmonary standpoint.  Cortisol somewhat low.  Can place on 10 mg daily for now (up from 5/10 alternating).  Severe psoriatic arthritis.  Usually on Enbrel.  Can consider restarting that around 3 weeks out from surgery on side outside window of possible leak or abscess.  Hopefully could wean off steroids as well  Nystatin & fluconazole for prob yeast rash under panniculus, etc  Pain control.  Hypercholesterolemia controlled  GERD control  CPAP for sleep apnea  Borderline diabetes.  SSI & follow  VTE prophylaxis- SCDs, etc  -mobilize as tolerated to help recovery.  She is already rather deconditioned.  Hopefully can work with therapy when she is more alert.    Initial impression patient would benefit from skilled nursing facility for more aggressive care and daily rehab.  We will get transitional care team involved given her medical complexity and deconditioned state.  Perhaps she would be ready to discharge in a few days, Monday if improves.  We will see.  40 minutes spent in review, evaluation, examination, counseling, and coordination of care.  More than 50% of that time was spent in counseling.  07/21/2020    Subjective: (Chief complaint)  Patient sitting up in chair.  Denies nausea.  Nursing in room.  Started to try and walk to the hallway.    Objective:  Vital signs:  Vitals:   07/20/20 1337 07/20/20 2038 07/20/20 2112 07/21/20 0552  BP: 126/64  123/68 (!) 142/65  Pulse: 75 74 66 72  Resp: 18 17 19 18   Temp: 98.1 F (36.7 C)  97.9 F (36.6 C) 98.3 F (36.8 C)  TempSrc:    Oral  SpO2: 96% 95% 93% 93%  Weight:      Height:        Last BM Date: 07/20/20  Intake/Output   Yesterday:  11/27 0701 - 11/28 0700 In: 610 [P.O.:600] Out: 8527 [Urine:3850; Drains:70] This shift:  No intake/output data recorded.  Bowel function:  Flatus:  YES  BM:  YES  Drain: Serosanguinous   Physical Exam:  General: Pt awake/alert in no acute distress.  Somewhat groggy but more alert today.  Joking. Eyes: PERRL, normal EOM.  Sclera clear.  No icterus Neuro: CN II-XII intact w/o focal sensory/motor deficits. Lymph: No head/neck/groin lymphadenopathy Psych:  No delerium/psychosis/paranoia.  Oriented x 4 HENT: Normocephalic, Mucus membranes moist.  No thrush Neck: Supple, No tracheal deviation.  No obvious thyromegaly Chest: No pain to chest wall compression.  Good respiratory excursion.  No audible wheezing CV:  Pulses intact.  Regular rhythm.  No major extremity edema MS: Normal AROM mjr joints.  No obvious deformity  Abdomen: Soft.  Nondistended.  Mildly tender at incisions only.  Dressings all removed.  Clean dry and intact no evidence of peritonitis.  No incarcerated hernias.  GU: Pfannenstiel incision dry but mild red rash returning.  Foley in place with clear    yellow urine. Ext:  No deformity.  No mjr edema.  No cyanosis Skin: No petechiae / purpurea.  No major sores.  Warm and dry    Results:   Cultures: Recent Results (from the past 720 hour(s))  SARS CORONAVIRUS 2 (TAT 6-24 HRS) Nasopharyngeal Nasopharyngeal Swab  Status: None   Collection Time: 07/13/20 10:40 AM   Specimen: Nasopharyngeal Swab  Result Value Ref Range Status   SARS Coronavirus 2 NEGATIVE NEGATIVE Final    Comment: (NOTE) SARS-CoV-2 target nucleic acids are NOT DETECTED.  The SARS-CoV-2 RNA is generally detectable in upper and lower respiratory specimens during the acute phase of infection. Negative results do not preclude SARS-CoV-2 infection, do not rule out co-infections with other pathogens, and should not be used as the sole basis for treatment or other patient management decisions. Negative results must be combined with clinical observations, patient history, and epidemiological information. The expected result is Negative.  Fact Sheet  for Patients: SugarRoll.be  Fact Sheet for Healthcare Providers: https://www.woods-mathews.com/  This test is not yet approved or cleared by the [place] FDA and  has been authorized for detection and/or diagnosis of SARS-CoV-2 by FDA under an Emergency Use Authorization (EUA). This EUA will remain  in effect (meaning this test can be used) for the duration of the COVID-19 declaration under Se ction 564(b)(1) of the Act, 21 U.S.C. section 360bbb-3(b)(1), unless the authorization is terminated or revoked sooner.  Performed at [facility], [place] [address], [place], [place] [address]     Labs: Results for orders placed or performed during the hospital encounter of 07/17/20 (from the past 48 hour(s))  Comprehensive metabolic panel     Status: Abnormal   Collection Time: 07/19/20  9:06 AM  Result Value Ref Range   Sodium 138 135 - 145 mmol/L   Potassium 4.1 3.5 - 5.1 mmol/L   Chloride 104 98 - 111 mmol/L   CO2 27 22 - 32 mmol/L   Glucose, Bld 97 70 - 99 mg/dL    Comment: Glucose reference range applies only to samples taken after fasting for at least 8 hours.   BUN 21 (H) 6 - 20 mg/dL   Creatinine, Ser 1.56 (H) 0.44 - 1.00 mg/dL   Calcium 11.7 (H) 8.9 - 10.3 mg/dL   Total Protein 6.1 (L) 6.5 - 8.1 g/dL   Albumin 2.8 (L) 3.5 - 5.0 g/dL   AST 17 15 - 41 U/L   ALT 14 0 - 44 U/L   Alkaline Phosphatase 65 38 - 126 U/L   Total Bilirubin 0.9 0.3 - 1.2 mg/dL   GFR, Estimated 38 (L) >60 mL/min    Comment: (NOTE) Calculated using the CKD-EPI Creatinine Equation (2021)    Anion gap 7 5 - 15    Comment: Performed at [facility], [place] [address], [place], [place] [address]  Glucose, capillary     Status: None   Collection Time: 07/19/20 12:32 PM  Result Value Ref Range   Glucose-Capillary 98 70 - 99 mg/dL    Comment: Glucose reference range applies only to samples taken after fasting for at least 8 hours.  Glucose,  capillary     Status: Abnormal   Collection Time: 07/19/20  5:14 PM  Result Value Ref Range   Glucose-Capillary 103 (H) 70 - 99 mg/dL    Comment: Glucose reference range applies only to samples taken after fasting for at least 8 hours.  TSH     Status: None   Collection Time: 07/19/20  7:02 PM  Result Value Ref Range   TSH 1.301 0.350 - 4.500 uIU/mL    Comment: Performed by a 3rd Generation assay with a functional sensitivity of <=0.01 uIU/mL. Performed at [facility], [place] [address], [place], [place] [address]  Ammonia     Status: None   Collection Time: 07/19/20  7:02 PM  Result Value Ref Range   Ammonia 28 9 - 35 umol/L    Comment: Performed at [facility], [place] [address] [place], [place] [address]  Glucose, capillary     Status: None   Collection Time: 07/19/20 10:23 PM  Result Value Ref Range   Glucose-Capillary 97 70 - 99 mg/dL    Comment: Glucose reference range applies only to samples taken after fasting for at least 8 hours.  Magnesium     Status: None   Collection Time: 07/20/20  3:49 AM  Result Value Ref Range   Magnesium 1.9 1.7 - 2.4 mg/dL    Comment: Performed at [facility], [place] [address], [place], [place] [address]  CBC     Status: Abnormal   Collection Time: 07/20/20  3:49 AM  Result Value Ref Range   WBC 13.4 (H) 4.0 - 10.5 K/uL   RBC 2.61 (L) 3.87 - 5.11 MIL/uL   Hemoglobin 8.4 (L) 12.0 - 15.0 g/dL   HCT 27.2 (L) 36 - 46 %   MCV 104.2 (H) 80.0 - 100.0 fL   MCH 32.2 26.0 - 34.0 pg   MCHC 30.9 30.0 - 36.0 g/dL   RDW 13.6 11.5 - 15.5 %   Platelets 212 150 - 400 K/uL   nRBC 0.0 0.0 - 0.2 %    Comment: Performed at [facility], [place] [address], [place], [place] [address]  Basic metabolic panel     Status: Abnormal   Collection Time: 07/20/20  3:49 AM  Result Value Ref Range   Sodium 140 135 - 145 mmol/L   Potassium 3.9 3.5 - 5.1 mmol/L   Chloride 101 98 - 111 mmol/L   CO2 31  22 - 32 mmol/L   Glucose, Bld 88 70 - 99 mg/dL    Comment: Glucose reference range applies only to samples taken after fasting for at least 8 hours.   BUN 19 6 - 20 mg/dL   Creatinine, Ser 1.33 (H) 0.44 - 1.00 mg/dL   Calcium 11.4 (H) 8.9 - 10.3 mg/dL   GFR, Estimated 46 (L) >60 mL/min    Comment: (NOTE) Calculated using the CKD-EPI Creatinine Equation (2021)    Anion gap 8 5 - 15    Comment: Performed at [facility], [place] [address], [place], [place] [address]  Cortisol-am, blood     Status: Abnormal   Collection Time: 07/20/20  3:49 AM  Result Value Ref Range   Cortisol - AM 5.8 (L) 6.7 - 22.6 ug/dL    Comment: Performed at [place] [address], [place], [place] [address]  Glucose, capillary     Status: None   Collection Time: 07/20/20  7:22 AM  Result Value Ref Range   Glucose-Capillary 85 70 - 99 mg/dL    Comment: Glucose reference range applies only to samples taken after fasting for at least 8 hours.  Glucose, capillary     Status: Abnormal   Collection Time: 07/20/20 11:35 AM  Result Value Ref Range   Glucose-Capillary 116 (H) 70 - 99 mg/dL    Comment: Glucose reference range applies only to samples taken after fasting for at least 8 hours.  Creatinine, fluid (JP Drainage)     Status: None   Collection Time: 07/20/20 12:23 PM  Result Value Ref Range   Creat, Fluid 1.4 mg/dL    Comment: (  NOTE) No normal range established for this test Results should be evaluated in conjunction with serum values    Fluid Type-FCRE JP DRAINAGE     Comment: Performed at [facility], [place] [address], [place], [place] [address]  Glucose, capillary     Status: Abnormal   Collection Time: 07/20/20  5:11 PM  Result Value Ref Range   Glucose-Capillary 122 (H) 70 - 99 mg/dL    Comment: Glucose reference range applies only to samples taken after fasting for at least 8 hours.  Glucose, capillary     Status: None   Collection Time: 07/20/20  9:13  PM  Result Value Ref Range   Glucose-Capillary 95 70 - 99 mg/dL    Comment: Glucose reference range applies only to samples taken after fasting for at least 8 hours.   Comment 1 Notify RN   CBC with Differential/Platelet     Status: Abnormal   Collection Time: 07/21/20  3:25 AM  Result Value Ref Range   WBC 12.0 (H) 4.0 - 10.5 K/uL   RBC 2.67 (L) 3.87 - 5.11 MIL/uL   Hemoglobin 8.6 (L) 12.0 - 15.0 g/dL   HCT 27.8 (L) 36 - 46 %   MCV 104.1 (H) 80.0 - 100.0 fL   MCH 32.2 26.0 - 34.0 pg   MCHC 30.9 30.0 - 36.0 g/dL   RDW 13.6 11.5 - 15.5 %   Platelets 246 150 - 400 K/uL   nRBC 0.0 0.0 - 0.2 %   Neutrophils Relative % 82 %   Neutro Abs 9.8 (H) 1.7 - 7.7 K/uL   Lymphocytes Relative 8 %   Lymphs Abs 1.0 0.7 - 4.0 K/uL   Monocytes Relative 7 %   Monocytes Absolute 0.8 0.1 - 1.0 K/uL   Eosinophils Relative 2 %   Eosinophils Absolute 0.3 0.0 - 0.5 K/uL   Basophils Relative 0 %   Basophils Absolute 0.0 0.0 - 0.1 K/uL   Immature Granulocytes 1 %   Abs Immature Granulocytes 0.15 (H) 0.00 - 0.07 K/uL    Comment: Performed at [facility], [place] [address], [place], [place] [address]  Basic metabolic panel     Status: Abnormal   Collection Time: 07/21/20  3:25 AM  Result Value Ref Range   Sodium 139 135 - 145 mmol/L   Potassium 3.4 (L) 3.5 - 5.1 mmol/L   Chloride 99 98 - 111 mmol/L   CO2 32 22 - 32 mmol/L   Glucose, Bld 82 70 - 99 mg/dL    Comment: Glucose reference range applies only to samples taken after fasting for at least 8 hours.   BUN 16 6 - 20 mg/dL   Creatinine, Ser 1.24 (H) 0.44 - 1.00 mg/dL   Calcium 11.4 (H) 8.9 - 10.3 mg/dL   GFR, Estimated 50 (L) >60 mL/min    Comment: (NOTE) Calculated using the CKD-EPI Creatinine Equation (2021)    Anion gap 8 5 - 15    Comment: Performed at [facility], [place] [address] [place], [place] [address]  Magnesium     Status: None   Collection Time: 07/21/20  3:25 AM  Result Value Ref Range    Magnesium 1.8 1.7 - 2.4 mg/dL    Comment: Performed at [facility], [place] [address] [place], [place] [address]  Phosphorus     Status: Abnormal   Collection Time: 07/21/20  3:25 AM  Result Value Ref Range   Phosphorus 1.9 (L) 2.5 -  4.6 mg/dL    Comment: Performed at [facility], [place] [address] [place], [place] [address]    Imaging / Studies: CT HEAD WO CONTRAST  Result Date: 07/19/2020 CLINICAL DATA:  Mental status change, unknown cause. EXAM: CT HEAD WITHOUT CONTRAST TECHNIQUE: Contiguous axial images were obtained from the base of the skull through the vertex without intravenous contrast. COMPARISON:  Head CT 06/03/2020.  Brain MRI 02/20/2020. FINDINGS: Brain: Cerebral volume is normal for age. There is no acute intracranial hemorrhage. No demarcated cortical infarct. No extra-axial fluid collection. No evidence of intracranial mass. No midline shift. Partially empty sella turcica. Vascular: No hyperdense vessel. Skull: Normal. Negative for fracture or focal lesion. Sinuses/Orbits: Visualized orbits show no acute finding. No significant paranasal sinus disease at the imaged levels. 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 IMPRESSION: No evidence of acute intracranial abnormality. Redemonstrated partially empty sella turcica. This finding is very commonly incidental, but can be associated with idiopathic intracranial hypertension. Incidentally noted advanced left TMJ osteoarthrosis. Electronically Signed   By: Kellie Simmering DO   On: 07/19/2020 12:26   MR BRAIN WO CONTRAST  Result Date: 07/20/2020 CLINICAL DATA:  Altered mental status. EXAM: MRI HEAD WITHOUT CONTRAST TECHNIQUE: Multiplanar, multiecho pulse sequences of the brain and surrounding structures were obtained without intravenous contrast. COMPARISON:  Head CT 07/19/2020 and MRI 02/20/2020 FINDINGS: The study is mildly motion degraded. Brain: There is no evidence of an  acute infarct, intracranial hemorrhage, mass, midline shift, or extra-axial fluid collection. A partially empty sella is unchanged from the prior MRI. The ventricles and sulci are normal. No significant white matter disease is seen for age. Vascular: Major intracranial vascular flow voids are preserved. Skull and upper cervical spine: Unremarkable bone marrow signal para Sinuses/Orbits: Bilateral cataract extraction. Minimal sphenoid sinus mucosal thickening. Clear mastoid air cells. Other: None. IMPRESSION: Unchanged and largely unremarkable appearance of the brain. No acute intracranial abnormality. Electronically Signed   By: Logan Bores M.D.   On: 07/20/2020 14:39    Medications / Allergies: per chart  Antibiotics: Anti-infectives (From admission, onward)   Start     Dose/Rate Route Frequency Ordered Stop   07/17/20 2000  cefoTEtan (CEFOTAN) 2 g in sodium chloride 0.9 % 100 mL IVPB        2 g 200 mL/hr over 30 Minutes Intravenous Every 12 hours 07/17/20 1615 07/17/20 2359   07/17/20 1400  neomycin (MYCIFRADIN) tablet 1,000 mg  Status:  Discontinued       "And" Linked Group Details   1,000 mg Oral 3 times per day 07/17/20 0603 07/17/20 0609   07/17/20 1400  metroNIDAZOLE (FLAGYL) tablet 1,000 mg  Status:  Discontinued       "And" Linked Group Details   1,000 mg Oral 3 times per day 07/17/20 0603 07/17/20 0609   07/17/20 0615  cefoTEtan (CEFOTAN) 2 g in sodium chloride 0.9 % 100 mL IVPB        2 g 200 mL/hr over 30 Minutes Intravenous On call to O.R. 07/17/20 0603 07/17/20 3299        Note: Portions of this report may have been transcribed using voice recognition software. Every effort was made to ensure accuracy; however, inadvertent computerized transcription errors may be present.   Any transcriptional errors that result from this process are unintentional.    [name], MD, FACS, MASCRS Gastrointestinal and Minimally Invasive Surgery  [facility] [address], [place], [place] [address] [phone]  Fax [phone] Main/Paging  CONTACT INFORMATION: Weekday (9AM-5PM) concerns: Call [facility] main office at [phone] Weeknight (5PM-9AM) or Weekend/Holiday concerns: Check [email or web address] for General Surgery [facility] coverage (Please, do not use SecureChat as it is not reliable communication to operating surgeons for immediate patient care)      07/21/2020  7:35 AM

## 2020-07-21 NOTE — Progress Notes (Signed)
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 Postoperatively, patient was noted to be more sleepy and groggy so medicine was consulted. She has some fluid overload.  Negative for stroke.  Improved with symptomatic treatment and de-escalation of sedatives.   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 History of cerebellar stroke, new MRI with no evidence of recurrence of stroke. Minimizing opiates and benzodiazepines. Electrolytes normal.  Ammonia normal.  TSH normal.  Arterial blood gas normal with no CO2 retention. Continue mobility. Discontinued dose of gabapentin, Aricept and Benadryl with clinical improvement. PT OT, out of bed.  Hypoxemia: Multifactorial.  Due to  atelectasis.  Aggressive chest physiotherapy antivenin exercises. Currently on Lasix 40 mg twice a day with excellent response. Balance -8 L.  Continue Lasix along with electrolyte replacements.  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  Type 2 diabetes: Blood sugars are stable on current dose of insulin.  Psoriatic arthritis: On low-dose maintenance prednisone.  OSA on CPAP: Using CPAP at night.  Thank you for consult.  We will continue to follow this patient along with you while she is in the hospital.   DVT prophylaxis: enoxaparin (LOVENOX) injection 40 mg Start: 07/18/20 0800 SCD's Start: 07/17/20 1616 Place TED hose Start: 07/17/20 1616   Code Status: Full code Family Communication: Daughter at the bedside 11/27. Disposition Plan: Status is: Inpatient  Remains inpatient appropriate because:Inpatient level of care appropriate due to severity of illness   Dispo: The patient is from: Home              Anticipated d/c is to: SNF              Anticipated d/c date is: > 3 days              Patient currently is not medically stable to d/c.         Consultants:   TRH  Procedures:   As above  Antimicrobials:   None   Subjective: Patient seen and examined.  No overnight events.  She feels slightly more energetic today.  She was sitting in couch.  Was on 3 L of oxygen.  Was able to actively participate on incentive spirometry.  Denies any chest pain shortness of breath or abdominal pain.  Objective: Vitals:   07/20/20 1337 07/20/20 2038 07/20/20 2112 07/21/20 0552  BP: 126/64  123/68 (!) 142/65  Pulse:  75 74 66 72  Resp: 18 17 19 18   Temp: 98.1 F (36.7 C)  97.9 F (36.6 C) 98.3 F (36.8 C)  TempSrc:    Oral  SpO2: 96% 95% 93% 93%  Weight:      Height:        Intake/Output Summary (Last 24 hours) at 07/21/2020 1310 Last data filed at 07/21/2020 1000 Gross per 24 hour  Intake 992.86 ml  Output 4300 ml  Net -3307.14 ml    Filed Weights   07/17/20 1622 07/18/20 0500 07/20/20 0500  Weight: 100.7 kg 102.8 kg 102 kg    Examination:  General exam: Appears calm and comfortable, sitting in couch.  Not in any distress.  On 3 L oxygen. Respiratory system: Poor air entry at bases.  No added sounds. Cardiovascular system: S1 & S2 heard, RRR.  Gastrointestinal system: Abdomen is distended but nontender.  Obese and pendulous. Foley catheter with dark urine. Central nervous system: Alert and oriented.  Cranial nerves with no deficits. Bilateral upper and lower extremity motor and sensory normal. Extremities: Symmetric 5 x 5 power. Skin: No rashes, lesions or ulcers Psychiatry: Judgement and insight appear normal. Mood & affect appropriate.     Data Reviewed: I have personally reviewed following labs and imaging studies  CBC: Recent Labs  Lab 07/17/20 0645 07/18/20 0449 07/19/20 0543 07/20/20 0349 07/21/20 0325  WBC 7.4 15.0* 14.2* 13.4* 12.0*  NEUTROABS 5.3  --   --   --  9.8*  HGB 9.7* 8.5* 8.6* 8.4* 8.6*  HCT 31.7* 26.7* 27.8* 27.2* 27.8*  MCV 104.6* 105.1* 106.9* 104.2* 104.1*  PLT 218 224 198 212 354   Basic Metabolic Panel: Recent Labs  Lab 07/18/20 0449 07/18/20 0955 07/19/20 0543 07/19/20 0906 07/20/20 0349 07/21/20 0325  NA 136  --  141 138 140 139  K 4.6  --  4.4  4.2 4.1 3.9 3.4*  CL 104  --  108 104 101 99  CO2 26  --  27 27 31  32  GLUCOSE 124*  --  89 97 88 82  BUN 20  --  22* 21* 19 16  CREATININE 1.70*  --  1.56*  1.50* 1.56* 1.33* 1.24*  CALCIUM 10.7*  --  11.6* 11.7* 11.4* 11.4*  MG 1.7  --   --   --  1.9 1.8  PHOS  --  3.1  --   --   --  1.9*   GFR: Estimated Creatinine Clearance: 54 mL/min (A) (by C-G formula based on SCr of 1.24 mg/dL (H)). Liver Function Tests: Recent Labs  Lab 07/17/20 0645 07/19/20 0906  AST 18 17  ALT 12 14  ALKPHOS 73 65  BILITOT 0.4 0.9  PROT 6.0* 6.1*  ALBUMIN 3.1* 2.8*   No results for input(s): LIPASE, AMYLASE in the last 168  hours. Recent Labs  Lab 07/19/20 1902  AMMONIA 28   Coagulation Profile: No results for input(s): INR, PROTIME in the last 168 hours. Cardiac Enzymes: No results for input(s): CKTOTAL, CKMB, CKMBINDEX, TROPONINI in the last 168 hours. BNP (last 3 results) No results for input(s): PROBNP in the last 8760 hours. HbA1C: No results for input(s): HGBA1C in the last 72 hours. CBG: Recent Labs  Lab 07/20/20 0722 07/20/20 1135 07/20/20 1711 07/20/20 2113 07/21/20 0735  GLUCAP 85 116* 122* 95 95   Lipid Profile: No results for input(s): CHOL, HDL, LDLCALC, TRIG, CHOLHDL, LDLDIRECT in the last 72 hours. Thyroid Function Tests: Recent Labs  07/19/20 1902  TSH 1.301   Anemia Panel: No results for input(s): VITAMINB12, FOLATE, FERRITIN, TIBC, IRON, RETICCTPCT in the last 72 hours. Sepsis Labs: No results for input(s): PROCALCITON, LATICACIDVEN in the last 168 hours.  Recent Results (from the past 240 hour(s))  SARS CORONAVIRUS 2 (TAT 6-24 HRS) Nasopharyngeal Nasopharyngeal Swab     Status: None   Collection Time: 07/13/20 10:40 AM   Specimen: Nasopharyngeal Swab  Result Value Ref Range Status   SARS Coronavirus 2 NEGATIVE NEGATIVE Final    Comment: (NOTE) SARS-CoV-2 target nucleic acids are NOT DETECTED.  The SARS-CoV-2 RNA is generally detectable in upper and lower respiratory specimens during the acute phase of infection. Negative results do not preclude SARS-CoV-2 infection, do not rule out co-infections with other pathogens, and should not be used as the sole basis for treatment or other patient management decisions. Negative results must be combined with clinical observations, patient history, and epidemiological information. The expected result is Negative.  Fact Sheet for Patients: SugarRoll.be  Fact Sheet for Healthcare Providers: https://www.woods-mathews.com/  This test is not yet approved or cleared by the [place] FDA and  has been authorized for detection and/or diagnosis of SARS-CoV-2 by FDA under an Emergency Use Authorization (EUA). This EUA will remain  in effect (meaning this test can be used) for the duration of the COVID-19 declaration under Se ction 564(b)(1) of the Act, 21 U.S.C. section 360bbb-3(b)(1), unless the authorization is terminated or revoked sooner.  Performed at [facility], [place] [address], [place], [place] [address]          Radiology Studies: MR BRAIN WO CONTRAST  Result Date: 07/20/2020 CLINICAL DATA:  Altered mental status. EXAM: MRI HEAD WITHOUT CONTRAST TECHNIQUE: Multiplanar, multiecho pulse sequences of the brain and surrounding structures were obtained without intravenous contrast. COMPARISON:  Head CT 07/19/2020 and MRI 02/20/2020 FINDINGS: The study is mildly motion degraded. Brain: There is no evidence of an acute infarct, intracranial hemorrhage, mass, midline shift, or extra-axial fluid collection. A partially empty sella is unchanged from the prior MRI. The ventricles and sulci are normal. No significant white matter disease is seen for age. Vascular: Major intracranial vascular flow voids are preserved. Skull and upper cervical spine: Unremarkable bone marrow signal para Sinuses/Orbits: Bilateral cataract extraction. Minimal sphenoid sinus mucosal thickening. Clear mastoid air cells. Other: None. IMPRESSION: Unchanged and largely unremarkable appearance of the brain. No acute intracranial abnormality. Electronically Signed   By: Logan Bores M.D.   On: 07/20/2020 14:39        Scheduled Meds: . acetaminophen  500 mg Oral Q6H  . vitamin C  500 mg Oral QHS  . aspirin EC  81 mg Oral Daily  . B-complex with vitamin C  1 tablet Oral QHS  . Chlorhexidine Gluconate Cloth  6 each Topical Daily  . cholecalciferol  1,000 Units Oral Daily  . enoxaparin (LOVENOX) injection  40 mg Subcutaneous Q24H  . escitalopram  30 mg Oral Daily  . feeding  supplement (GLUCERNA SHAKE)  237 mL Oral BID BM  . furosemide  40 mg Oral BID  . lamoTRIgine  150 mg Oral BID  . lip balm  1 application Topical BID  . magnesium oxide  400 mg Oral Daily  . nadolol  10 mg Oral Daily  . pantoprazole  40 mg Oral Daily  . potassium chloride  40 mEq Oral Daily  . predniSONE  10 mg Oral Q breakfast  . psyllium  1 packet Oral  BID  . rosuvastatin  5 mg Oral Daily  . sodium chloride flush  10-40 mL Intracatheter Q12H  . traZODone  200 mg Oral QHS   Continuous Infusions: . methocarbamol (ROBAXIN) IV    . potassium PHOSPHATE IVPB (in mmol) 20 mmol (07/21/20 0950)     LOS: 4 days    Time spent: 30 minutes    Barb Merino, MD [facility] Pager [phone]

## 2020-07-22 ENCOUNTER — Inpatient Hospital Stay (HOSPITAL_COMMUNITY): Payer: Medicare Other

## 2020-07-22 LAB — CBC WITH DIFFERENTIAL/PLATELET
Abs Immature Granulocytes: 0.3 10*3/uL — ABNORMAL HIGH (ref 0.00–0.07)
Basophils Absolute: 0 10*3/uL (ref 0.0–0.1)
Basophils Relative: 0 %
Eosinophils Absolute: 0.3 10*3/uL (ref 0.0–0.5)
Eosinophils Relative: 3 %
HCT: 28.8 % — ABNORMAL LOW (ref 36.0–46.0)
Hemoglobin: 8.9 g/dL — ABNORMAL LOW (ref 12.0–15.0)
Immature Granulocytes: 2 %
Lymphocytes Relative: 9 %
Lymphs Abs: 1.1 10*3/uL (ref 0.7–4.0)
MCH: 32 pg (ref 26.0–34.0)
MCHC: 30.9 g/dL (ref 30.0–36.0)
MCV: 103.6 fL — ABNORMAL HIGH (ref 80.0–100.0)
Monocytes Absolute: 1.1 10*3/uL — ABNORMAL HIGH (ref 0.1–1.0)
Monocytes Relative: 9 %
Neutro Abs: 9.4 10*3/uL — ABNORMAL HIGH (ref 1.7–7.7)
Neutrophils Relative %: 77 %
Platelets: 261 10*3/uL (ref 150–400)
RBC: 2.78 MIL/uL — ABNORMAL LOW (ref 3.87–5.11)
RDW: 13.6 % (ref 11.5–15.5)
WBC: 12.3 10*3/uL — ABNORMAL HIGH (ref 4.0–10.5)
nRBC: 0 % (ref 0.0–0.2)

## 2020-07-22 LAB — CREATININE, SERUM
Creatinine, Ser: 1.34 mg/dL — ABNORMAL HIGH (ref 0.44–1.00)
GFR, Estimated: 45 mL/min — ABNORMAL LOW (ref >60)

## 2020-07-22 LAB — PREALBUMIN: Prealbumin: 12 mg/dL — ABNORMAL LOW (ref 18–38)

## 2020-07-22 LAB — SURGICAL PATHOLOGY

## 2020-07-22 LAB — POTASSIUM: Potassium: 3.5 mmol/L (ref 3.5–5.1)

## 2020-07-22 NOTE — Progress Notes (Signed)
Valerie [name] [id number] 1961/01/18  CARE TEAM:  PCP: Ophelia Shoulder, MD  Outpatient Care Team: Patient Care Team: Ophelia Shoulder, MD as PCP - General (Internal Medicine) [name], MD as PCP - Cardiology (Cardiology) Janith Lima, MD as Consulting Physician (Urology) Michael Boston, MD as Consulting Physician (Colon and Rectal Surgery) Carol Ada, MD as Consulting Physician (Gastroenterology) [name], MD as Consulting Physician (Cardiology)  Inpatient Treatment Team: Treatment Team: Attending Provider: Barb Merino, MD; Technician: Ernest Mallick, NT; Consulting Physician: Harold Hedge, MD; Consulting Physician: Mariel Aloe, MD; Rounding Team: Suzan Garibaldi, MD; Registered Nurse: Efraim Kaufmann, RN; Technician: Abbe Amsterdam, NT; Registered Nurse: Charlyne Petrin, RN; Consulting Physician: Michael Boston, MD   Problem List:   Principal Problem:   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 Active Problems:   Psoriatic arthritis ([place])   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   Anxiety   Obesity, Class III, BMI 40-49.9 (morbid obesity) ([place])   CKD (chronic kidney disease), stage III (HCC)   OSA (obstructive sleep apnea)   Bipolar disorder ([place])   DM type 2 (diabetes mellitus, type 2) ([place])   CAD (coronary artery disease)   Protein-calorie malnutrition, moderate (HCC)   Anemia, chronic disease   Altered mental status   Acute respiratory failure with hypoxemia ([place])   Hypoxemia   5 Days Post-Op    07/17/2020  POST-OPERATIVE DIAGNOSIS:  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 Diverticulitis with colovesical fistula  Postoperative diagnosis:  1. Diverticulitis with colovesical fistula  Procedure: 1. Cystoscopy 2. Bilateral ureteral cannulation and injection of indocyanine green dye  Surgeon: Pryor Curia. M.D.  Assessment  SLOWLY IMPROVING  [facility] Stay = 5 days)  Plan:  [facility] internal medicine help appreciated  Heart healthy solid diet as tolerated  Mental status more WNL to me.  Defer to medicine and neurology - CT & MRI underwhelming a hopeful sign.  Pain control adequate.  Holding gabapentin given grogginess.  Low-dose Tylenol seems to be adequate.  Patient used to hydrocodone to use for p.o.  Hypoxia most likely related to pulmonary issues despite try to keep on the dry side.  Lasix responsive so will place on scheduled Lasix for 3 more doses and follow creatinine Able to go down from 5 L to 3 L.  Hopefully can wean further versus need for home health oxygen.  Could benefit from chest x-ray but will defer to medicine.   Hypokalemia most likely from diuresis.  Replace orally and follow  Follow-up on pathology  Foley catheter for at least 7 days and then cystogram on postop day 7 = 12/1 Wed.  Low threshold to carry further out or have urology reinvolved if delayed healing.  She is high risk for this given her chronic immunosuppression and large fistula requiring 2 layer closure and omentopexy.  Keep surgical drain as well.  Most likely will send urine for creatinine on postop day 7 to rule out any urine leak as well  Acute on chronic anemia.  IV iron given 11/25.  Check anemia panel since she seems to be macrocytic to rule out any folate and vitamin B12 or other deficiencies  Keep  on low-dose steroids for now to minimize anastomotic healing issues while the same time preventing debilitating psoriatic arthritis.  Discussed with internal medicine.  Try to avoid bolusing high-dose steroids in  order to minimize anastomotic leak risks unless would be of benefit from a pulmonary standpoint.  Cortisol somewhat low.  Can place on 10 mg daily for now (up from 5/10 alternating).  Severe psoriatic arthritis.  Usually on Enbrel.  Can consider restarting that around 3 weeks out from surgery on side outside window of possible leak or abscess.  Hopefully could wean off steroids as well  Nystatin & fluconazole for prob yeast rash under panniculus, etc  Pain control.  Hypercholesterolemia controlled  GERD control  CPAP for sleep apnea  Borderline diabetes.  SSI & follow  VTE prophylaxis- SCDs, etc  -mobilize as tolerated to help recovery.  PT/OT seeing.  She is already rather deconditioned.  Hopefully can work with therapy when she is more alert.    Initial impression patient would benefit from skilled nursing facility for more aggressive care and daily rehab.  We will get transitional care team involved given her medical complexity and deconditioned state.  Perhaps she would be ready to discharge in a few days, Monday if improves.  We will see.  40 minutes spent in review, evaluation, examination, counseling, and coordination of care.  More than 50% of that time was spent in counseling.  07/22/2020    Subjective: (Chief complaint)  Pt walked to hallway No N/V but appetite fair "I dont like the food" Tol supp shakes    Objective:  Vital signs:  Vitals:   07/21/20 0552 07/21/20 1445 07/21/20 2137 07/22/20 0517  BP: (!) 142/65 131/62 136/70 121/63  Pulse: 72 66 (!) 58 66  Resp: 18 18 17 18   Temp: 98.3 F (36.8 C) 99.1 F (37.3 C) 98.9 F (37.2 C) 99.1 F (37.3 C)  TempSrc: Oral Oral Oral Oral  SpO2: 93% 97% 98% 92%  Weight:    94.5 kg  Height:        Last BM Date: 07/21/20  Intake/Output   Yesterday:  11/28 0701 - 11/29 0700 In: 1717.4 [P.O.:1200; IV Piggyback:507.4] Out: 9326 [Urine:3600; Drains:45] This shift:  Total I/O In: 240 [P.O.:240] Out: 2235  [Urine:2200; Drains:35]  Bowel function:  Flatus: YES  BM:  YES  Drain: Serosanguinous   Physical Exam:  General: Pt awake/alert in no acute distress.  Alert Eyes: PERRL, normal EOM.  Sclera clear.  No icterus Neuro: CN II-XII intact w/o focal sensory/motor deficits. Lymph: No head/neck/groin lymphadenopathy Psych:  No delerium/psychosis/paranoia.  Oriented x 4 HENT: Normocephalic, Mucus membranes moist.  No thrush Neck: Supple, No tracheal deviation.  No obvious thyromegaly Chest: No pain to chest wall compression.  Good respiratory excursion.  No audible wheezing CV:  Pulses intact.  Regular rhythm.  No major extremity edema MS: Normal AROM mjr joints.  No obvious deformity  Abdomen: Soft.  Nondistended.  Mildly tender at incisions only.  Dressings all removed.  Incisions - clean dry and intact no evidence of peritonitis.  No incarcerated hernias.  GU: Pfannenstiel incision w scant moisture but no cellulitis/seroma/ecchymosis.  Foley in place with clear    yellow urine. Ext:  No deformity.  No mjr edema.  No cyanosis Skin: No petechiae / purpurea.  No major sores.  Warm and dry    Results:   Cultures: Recent Results (from the past 720 hour(s))  SARS CORONAVIRUS 2 (TAT 6-24 HRS) Nasopharyngeal Nasopharyngeal Swab  Status: None   Collection Time: 07/13/20 10:40 AM   Specimen: Nasopharyngeal Swab  Result Value Ref Range Status   SARS Coronavirus 2 NEGATIVE NEGATIVE Final    Comment: (NOTE) SARS-CoV-2 target nucleic acids are NOT DETECTED.  The SARS-CoV-2 RNA is generally detectable in upper and lower respiratory specimens during the acute phase of infection. Negative results do not preclude SARS-CoV-2 infection, do not rule out co-infections with other pathogens, and should not be used as the sole basis for treatment or other patient management decisions. Negative results must be combined with clinical observations, patient history, and epidemiological information.  The expected result is Negative.  Fact Sheet for Patients: SugarRoll.be  Fact Sheet for Healthcare Providers: https://www.woods-mathews.com/  This test is not yet approved or cleared by the [place] FDA and  has been authorized for detection and/or diagnosis of SARS-CoV-2 by FDA under an Emergency Use Authorization (EUA). This EUA will remain  in effect (meaning this test can be used) for the duration of the COVID-19 declaration under Se ction 564(b)(1) of the Act, 21 U.S.C. section 360bbb-3(b)(1), unless the authorization is terminated or revoked sooner.  Performed at [facility], [place] [address] [place], [place] [address]     Labs: Results for orders placed or performed during the hospital encounter of 07/17/20 (from the past 48 hour(s))  Glucose, capillary     Status: None   Collection Time: 07/20/20  7:22 AM  Result Value Ref Range   Glucose-Capillary 85 70 - 99 mg/dL    Comment: Glucose reference range applies only to samples taken after fasting for at least 8 hours.  Glucose, capillary     Status: Abnormal   Collection Time: 07/20/20 11:35 AM  Result Value Ref Range   Glucose-Capillary 116 (H) 70 - 99 mg/dL    Comment: Glucose reference range applies only to samples taken after fasting for at least 8 hours.  Creatinine, fluid (JP Drainage)     Status: None   Collection Time: 07/20/20 12:23 PM  Result Value Ref Range   Creat, Fluid 1.4 mg/dL    Comment: (NOTE) No normal range established for this test Results should be evaluated in conjunction with serum values    Fluid Type-FCRE JP DRAINAGE     Comment: Performed at [facility], [place] [address] [place], [place] [address]  Glucose, capillary     Status: Abnormal   Collection Time: 07/20/20  5:11 PM  Result Value Ref Range   Glucose-Capillary 122 (H) 70 - 99 mg/dL    Comment: Glucose reference range applies only to samples taken after fasting  for at least 8 hours.  Glucose, capillary     Status: None   Collection Time: 07/20/20  9:13 PM  Result Value Ref Range   Glucose-Capillary 95 70 - 99 mg/dL    Comment: Glucose reference range applies only to samples taken after fasting for at least 8 hours.   Comment 1 Notify RN   CBC with Differential/Platelet     Status: Abnormal   Collection Time: 07/21/20  3:25 AM  Result Value Ref Range   WBC 12.0 (H) 4.0 - 10.5 K/uL   RBC 2.67 (L) 3.87 - 5.11 MIL/uL   Hemoglobin 8.6 (L) 12.0 - 15.0 g/dL   HCT 27.8 (L) 36 - 46 %   MCV 104.1 (H) 80.0 - 100.0 fL   MCH 32.2 26.0 - 34.0 pg   MCHC 30.9 30.0 - 36.0 g/dL   RDW 13.6 11.5 -  15.5 %   Platelets 246 150 - 400 K/uL   nRBC 0.0 0.0 - 0.2 %   Neutrophils Relative % 82 %   Neutro Abs 9.8 (H) 1.7 - 7.7 K/uL   Lymphocytes Relative 8 %   Lymphs Abs 1.0 0.7 - 4.0 K/uL   Monocytes Relative 7 %   Monocytes Absolute 0.8 0.1 - 1.0 K/uL   Eosinophils Relative 2 %   Eosinophils Absolute 0.3 0.0 - 0.5 K/uL   Basophils Relative 0 %   Basophils Absolute 0.0 0.0 - 0.1 K/uL   Immature Granulocytes 1 %   Abs Immature Granulocytes 0.15 (H) 0.00 - 0.07 K/uL    Comment: Performed at [facility], [place] [address] [place], [place] [address]  Basic metabolic panel     Status: Abnormal   Collection Time: 07/21/20  3:25 AM  Result Value Ref Range   Sodium 139 135 - 145 mmol/L   Potassium 3.4 (L) 3.5 - 5.1 mmol/L   Chloride 99 98 - 111 mmol/L   CO2 32 22 - 32 mmol/L   Glucose, Bld 82 70 - 99 mg/dL    Comment: Glucose reference range applies only to samples taken after fasting for at least 8 hours.   BUN 16 6 - 20 mg/dL   Creatinine, Ser 1.24 (H) 0.44 - 1.00 mg/dL   Calcium 11.4 (H) 8.9 - 10.3 mg/dL   GFR, Estimated 50 (L) >60 mL/min    Comment: (NOTE) Calculated using the CKD-EPI Creatinine Equation (2021)    Anion gap 8 5 - 15    Comment: Performed at [facility], [place] [address] [place], [place] [address]   Magnesium     Status: None   Collection Time: 07/21/20  3:25 AM  Result Value Ref Range   Magnesium 1.8 1.7 - 2.4 mg/dL    Comment: Performed at [facility], [place] [address] [place], [place] [address]  Phosphorus     Status: Abnormal   Collection Time: 07/21/20  3:25 AM  Result Value Ref Range   Phosphorus 1.9 (L) 2.5 - 4.6 mg/dL    Comment: Performed at [facility], [place] [address] [place], [place] [address]  Glucose, capillary     Status: None   Collection Time: 07/21/20  7:35 AM  Result Value Ref Range   Glucose-Capillary 95 70 - 99 mg/dL    Comment: Glucose reference range applies only to samples taken after fasting for at least 8 hours.  Prealbumin     Status: Abnormal   Collection Time: 07/21/20 11:53 PM  Result Value Ref Range   Prealbumin 12.0 (L) 18 - 38 mg/dL    Comment: Performed at [facility], [place] [address] [place],  [address]  CBC with Differential/Platelet     Status: Abnormal   Collection Time: 07/22/20  3:17 AM  Result Value Ref Range   WBC 12.3 (H) 4.0 - 10.5 K/uL   RBC 2.78 (L) 3.87 - 5.11 MIL/uL   Hemoglobin 8.9 (L) 12.0 - 15.0 g/dL   HCT 28.8 (L) 36 - 46 %   MCV 103.6 (H) 80.0 - 100.0 fL   MCH 32.0 26.0 - 34.0 pg   MCHC 30.9 30.0 - 36.0 g/dL   RDW 13.6 11.5 - 15.5 %   Platelets 261 150 - 400 K/uL   nRBC 0.0 0.0 - 0.2 %   Neutrophils Relative % 77 %   Neutro Abs 9.4 (H) 1.7 - 7.7 K/uL  Lymphocytes Relative 9 %   Lymphs Abs 1.1 0.7 - 4.0 K/uL   Monocytes Relative 9 %   Monocytes Absolute 1.1 (H) 0.1 - 1.0 K/uL   Eosinophils Relative 3 %   Eosinophils Absolute 0.3 0.0 - 0.5 K/uL   Basophils Relative 0 %   Basophils Absolute 0.0 0.0 - 0.1 K/uL   Immature Granulocytes 2 %   Abs Immature Granulocytes 0.30 (H) 0.00 - 0.07 K/uL    Comment: Performed at [facility], [place] [address] [place], [place] [address]  Potassium     Status: None   Collection Time: 07/22/20   3:17 AM  Result Value Ref Range   Potassium 3.5 3.5 - 5.1 mmol/L    Comment: Performed at [facility], [place] [address], [place], [place] [address]  Creatinine, serum     Status: Abnormal   Collection Time: 07/22/20  3:17 AM  Result Value Ref Range   Creatinine, Ser 1.34 (H) 0.44 - 1.00 mg/dL   GFR, Estimated 45 (L) >60 mL/min    Comment: (NOTE) Calculated using the CKD-EPI Creatinine Equation (2021) Performed at [facility], [place] [address] [place], [place] [address]     Imaging / Studies: MR BRAIN WO CONTRAST  Result Date: 07/20/2020 CLINICAL DATA:  Altered mental status. EXAM: MRI HEAD WITHOUT CONTRAST TECHNIQUE: Multiplanar, multiecho pulse sequences of the brain and surrounding structures were obtained without intravenous contrast. COMPARISON:  Head CT 07/19/2020 and MRI 02/20/2020 FINDINGS: The study is mildly motion degraded. Brain: There is no evidence of an acute infarct, intracranial hemorrhage, mass, midline shift, or extra-axial fluid collection. A partially empty sella is unchanged from the prior MRI. The ventricles and sulci are normal. No significant white matter disease is seen for age. Vascular: Major intracranial vascular flow voids are preserved. Skull and upper cervical spine: Unremarkable bone marrow signal para Sinuses/Orbits: Bilateral cataract extraction. Minimal sphenoid sinus mucosal thickening. Clear mastoid air cells. Other: None. IMPRESSION: Unchanged and largely unremarkable appearance of the brain. No acute intracranial abnormality. Electronically Signed   By: Logan Bores M.D.   On: 07/20/2020 14:39    Medications / Allergies: per chart  Antibiotics: Anti-infectives (From admission, onward)   Start     Dose/Rate Route Frequency Ordered Stop   07/21/20 1630  fluconazole (DIFLUCAN) tablet 200 mg        200 mg Oral  Once 07/21/20 1535 07/21/20 1601   07/17/20 2000  cefoTEtan (CEFOTAN) 2 g in sodium chloride 0.9 % 100 mL  IVPB        2 g 200 mL/hr over 30 Minutes Intravenous Every 12 hours 07/17/20 1615 07/17/20 2359   07/17/20 1400  neomycin (MYCIFRADIN) tablet 1,000 mg  Status:  Discontinued       "And" Linked Group Details   1,000 mg Oral 3 times per day 07/17/20 0603 07/17/20 0609   07/17/20 1400  metroNIDAZOLE (FLAGYL) tablet 1,000 mg  Status:  Discontinued       "And" Linked Group Details   1,000 mg Oral 3 times per day 07/17/20 0603 07/17/20 0609   07/17/20 0615  cefoTEtan (CEFOTAN) 2 g in sodium chloride 0.9 % 100 mL IVPB        2 g 200 mL/hr over 30 Minutes Intravenous On call to O.R. 07/17/20 0603 07/17/20 9449        Note: Portions of this report may have been transcribed using voice recognition software. Every effort was made to ensure accuracy; however, inadvertent  computerized transcription errors may be present.   Any transcriptional errors that result from this process are unintentional.    [name], MD, FACS, MASCRS Gastrointestinal and Minimally Invasive Surgery  [facility] [address], [place], [place] [address] [phone] Fax [phone] Main/Paging  CONTACT INFORMATION: Weekday (9AM-5PM) concerns: Call [facility] main office at [phone] Weeknight (5PM-9AM) or Weekend/Holiday concerns: Check [email or web address] for General Surgery [facility] coverage (Please, do not use SecureChat as it is not reliable communication to operating surgeons for immediate patient care)      07/22/2020  6:52 AM

## 2020-07-22 NOTE — Progress Notes (Signed)
Physical Therapy Treatment Patient Details Name: Valerie [name] MRN: [id number] DOB: 12-08-1960 Today's Date: 07/22/2020    History of Present Illness 59 year old female who presents with colovesical fistula, s/p rectosigmoid resection and fistula repair 07/17/20. PMH includes bipolar disorder, CAD, CKD, psoriatic arthritis, NSTEMI, B THA. Recent admission 10/11-10/21/21 for colovesical fistula and frequent falls.    PT Comments    Patient resting in bed on 2 L [place], SPO2 93%.The patient is motivated to ambulate today. Ambulated x 25' then 15' on RA with SPO2 87%  With return to 90% with rest. Placed back on 2 L. Participated in strengthening exercises for U/LE's. Patient plans for Rehab . Continue  Progressive mobility.   Follow Up Recommendations  SNF     Equipment Recommendations  None recommended by PT    Recommendations for Other Services       Precautions / Restrictions Precautions Precautions: Fall Precaution Comments: JP drain on R, on O2 but amb w/o    Mobility  Bed Mobility   Bed Mobility: Rolling;Sidelying to Sit;Sit to Supine Rolling: Supervision Sidelying to sit: Min guard     Sit to sidelying: Supervision General bed mobility comments: patient able to move to sitting and back to supine with supervision only  Transfers Overall transfer level: Needs assistance Equipment used: Rolling walker (2 wheeled) Transfers: Sit to/from Stand Sit to Stand: Min guard         General transfer comment: stands from bed, toilet using rails and from window seat with min guard.  Ambulation/Gait Ambulation/Gait assistance: Min assist Gait Distance (Feet): 25 Feet (then 15) Assistive device: Rolling walker (2 wheeled) Gait Pattern/deviations: Step-through pattern;Decreased stride length Gait velocity: decr   General Gait Details: gait  presnents with improved steadiness than previously   Chief Strategy Officer    Modified Rankin (Stroke  Patients Only)       Balance   Sitting-balance support: Feet supported;Bilateral upper extremity supported Sitting balance-Leahy Scale: Fair     Standing balance support: Bilateral upper extremity supported Standing balance-Leahy Scale: Fair                              Cognition Arousal/Alertness: Awake/alert Behavior During Therapy: WFL for tasks assessed/performed Overall Cognitive Status: Within Functional Limits for tasks assessed                                        Exercises General Exercises - Upper Extremity Shoulder Flexion: Strengthening;Theraband;10 reps;Supine Theraband Level (Shoulder Flexion): Level 2 (Red) Shoulder Extension: Strengthening;Both;10 reps;Supine;Theraband Theraband Level (Shoulder Extension): Level 2 (Red) Shoulder ABduction: Strengthening;Both;10 reps;Theraband;Supine Theraband Level (Shoulder Abduction): Level 2 (Red) Elbow Flexion: AROM;Both;10 reps;Theraband Theraband Level (Elbow Flexion): Level 2 (Red) Elbow Extension: Strengthening;Both;10 reps;Theraband Theraband Level (Elbow Extension): Level 2 (Red) General Exercises - Lower Extremity Ankle Circles/Pumps: AROM;Both;10 reps Heel Slides: AROM;Both;10 reps;Supine Hip ABduction/ADduction: AROM;Both;10 reps;Supine Straight Leg Raises: AROM;Both;10 reps;Supine    General Comments        Pertinent Vitals/Pain Faces Pain Scale: Hurts even more Pain Location: bladder spasm Pain Descriptors / Indicators: Cramping Pain Intervention(s): Monitored during session    Home Living                      Prior Function  PT Goals (current goals can now be found in the care plan section) Progress towards PT goals: Progressing toward goals    Frequency    Min 2X/week      PT Plan Current plan remains appropriate    Co-evaluation              AM-PAC PT "6 Clicks" Mobility   Outcome Measure  Help needed turning from your back  to your side while in a flat bed without using bedrails?: None Help needed moving from lying on your back to sitting on the side of a flat bed without using bedrails?: None Help needed moving to and from a bed to a chair (including a wheelchair)?: A Little Help needed standing up from a chair using your arms (e.g., wheelchair or bedside chair)?: A Little Help needed to walk in hospital room?: A Lot Help needed climbing 3-5 steps with a railing? : A Lot 6 Click Score: 18    End of Session Equipment Utilized During Treatment: Gait belt Activity Tolerance: Patient tolerated treatment well Patient left: in bed;with call bell/phone within reach;with bed alarm set Nurse Communication: Mobility status PT Visit Diagnosis: Unsteadiness on feet (R26.81)     Time: 1510-1540 PT Time Calculation (min) (ACUTE ONLY): 30 min  Charges:  $Gait Training: 8-22 mins $Self Care/Home Management: [place] Pager [phone] Office [phone]    Claretha Cooper 07/22/2020, 4:30 PM

## 2020-07-22 NOTE — TOC Progression Note (Signed)
Transition of Care [facility]) - Progression Note    Patient Details  Name: Talulah Schirmer MRN: [id number] Date of Birth: Feb 27, 1961  Transition of [facility] CM/SW [place], LCSW Phone Number: 07/22/2020, 3:01 PM  Clinical Narrative:    CSW provided SNF bed offers to the patient daughter [name]. CSW explain how daughter can reference ratings at StartupExpense.be. Daughter to follow up with a decision tomorrow.  A[place]a authorization initiated, pending reference# [id number].   Expected Discharge Plan: Skilled Nursing Facility Barriers to Discharge: Continued Medical Work up, Ship broker, Environmental education officer)  Expected Discharge Plan and Services Expected Discharge Plan: [place] In-house Referral: Clinical Social Work                                             Social Determinants of Health (SDOH) Interventions    Readmission Risk Interventions No flowsheet data found.

## 2020-07-22 NOTE — Care Management Important Message (Signed)
Important Message  Patient Details IM Letter given to the Patient. Name: Valerie [name] MRN: [id number] Date of Birth: 06-29-1961   Medicare Important Message Given:  Yes     Kerin Salen 07/22/2020, 11:46 AM

## 2020-07-22 NOTE — Progress Notes (Signed)
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 Postoperatively, patient was noted to be more sleepy and groggy so medicine was consulted. She has some fluid overload.  Negative for stroke.  Improved with symptomatic treatment and de-escalation of sedatives.   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 Active Problems:   Anxiety   Obesity, Class III, BMI 40-49.9 (morbid obesity) (HCC)   CKD (chronic kidney disease), stage III (HCC)   Psoriatic arthritis (HCC)   OSA (obstructive sleep apnea)   Bipolar disorder ([place])   DM type 2 (diabetes mellitus, type 2) (HCC)   CAD (coronary artery disease)   Protein-calorie malnutrition, moderate (HCC)   Anemia, chronic disease   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   Altered mental status   Acute respiratory failure with hypoxemia (HCC)   Hypoxemia  Acute metabolic encephalopathy, multifactorial.  Suspected medication induced.  Acute stroke ruled out.  Metabolic abnormalities ruled out.  Clinically improved with medication adjustment and discontinuing opiates and benzos.  Discontinued dose of gabapentin, Aricept and Benadryl with clinical improvement. PT OT, out of bed.  Hypoxemia: Multifactorial.  Due to atelectasis with underlying history of obstructive sleep apnea. Chest physiotherapy.  Incentive spirometry. Excellent response to  diuresis.  Repeat chest x-ray with improvement. Continue to keep on oxygen to keep saturations more than 92%.  She may take some time to come off the oxygen. She can be discharged to a skilled nursing facility on oxygen.  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  Foley catheter in place.  Type 2 diabetes: Blood sugars are stable on current dose of insulin.  Psoriatic arthritis: On low-dose maintenance prednisone.  OSA on CPAP: Not quite using CPAP at night.  She needs to start using it.  Thank you for consult.  We will continue to follow this patient along with you while she is in the hospital.   DVT prophylaxis: enoxaparin (LOVENOX) injection 40 mg Start: 07/18/20 0800 SCD's Start: 07/17/20 1616 Place TED hose Start: 07/17/20 1616   Code Status: Full code Family Communication: No family at bedside. Disposition Plan: Status is: Inpatient  Remains inpatient appropriate because:Inpatient level of care appropriate due to severity of illness   Dispo: The patient is from: Home              Anticipated d/c is to: SNF              Anticipated d/c date is: When surgically stable.              Patient currently is medically stable to transfer to skilled level of care if surgery is okay.    Consultants:   TRH  Procedures:   As above  Antimicrobials:   None   Subjective: Seen and examined.  No overnight events.  Feels better today.  No more confusion.  More participating.  Able to do incentive spirometry and pull up to 750 mL.  On 2 to 3 L of oxygen but mostly comfortable. Objective: Vitals:  07/21/20 0552 07/21/20 1445 07/21/20 2137 07/22/20 0517  BP: (!) 142/65 131/62 136/70 121/63  Pulse: 72 66 (!) 58 66  Resp: 18 18 17 18   Temp: 98.3 F (36.8 C) 99.1 F (37.3 C) 98.9 F (37.2 C) 99.1 F (37.3 C)  TempSrc: Oral Oral Oral Oral  SpO2: 93% 97% 98% 92%  Weight:    94.5 kg  Height:        Intake/Output Summary (Last 24 hours) at 07/22/2020  1059 Last data filed at 07/22/2020 1000 Gross per 24 hour  Intake 1454.5 ml  Output 3655 ml  Net -2200.5 ml   Filed Weights   07/18/20 0500 07/20/20 0500 07/22/20 0517  Weight: 102.8 kg 102 kg 94.5 kg    Examination:  General exam: Appears calm and comfortable, sitting in couch.  Not in any distress.  On 2 L oxygen. Respiratory system: Poor air entry at bases.  No added sounds. Cardiovascular system: S1 & S2 heard, RRR.  Gastrointestinal system: Abdomen is distended but nontender.  Obese and pendulous. Foley catheter with dark urine. Central nervous system: Alert and oriented.  Cranial nerves with no deficits. Bilateral upper and lower extremity motor and sensory normal. Extremities: Symmetric 5 x 5 power. Skin: No rashes, lesions or ulcers Psychiatry: Judgement and insight appear normal. Mood & affect appropriate.     Data Reviewed: I have personally reviewed following labs and imaging studies  CBC: Recent Labs  Lab 07/17/20 0645 07/17/20 0645 07/18/20 0449 07/19/20 0543 07/20/20 0349 07/21/20 0325 07/22/20 0317  WBC 7.4   < > 15.0* 14.2* 13.4* 12.0* 12.3*  NEUTROABS 5.3  --   --   --   --  9.8* 9.4*  HGB 9.7*   < > 8.5* 8.6* 8.4* 8.6* 8.9*  HCT 31.7*   < > 26.7* 27.8* 27.2* 27.8* 28.8*  MCV 104.6*   < > 105.1* 106.9* 104.2* 104.1* 103.6*  PLT 218   < > 224 198 212 246 261   < > = values in this interval not displayed.   Basic Metabolic Panel: Recent Labs  Lab 07/18/20 0449 07/18/20 0449 07/18/20 0955 07/19/20 0543 07/19/20 0906 07/20/20 0349 07/21/20 0325 07/22/20 0317  NA 136  --   --  141 138 140 139  --   K 4.6   < >  --  4.4  4.2 4.1 3.9 3.4* 3.5  CL 104  --   --  108 104 101 99  --   CO2 26  --   --  27 27 31  32  --   GLUCOSE 124*  --   --  89 97 88 82  --   BUN 20  --   --  22* 21* 19 16  --   CREATININE 1.70*   < >  --  1.56*  1.50* 1.56* 1.33* 1.24* 1.34*  CALCIUM 10.7*  --   --  11.6* 11.7* 11.4* 11.4*  --   MG 1.7  --   --   --   --   1.9 1.8  --   PHOS  --   --  3.1  --   --   --  1.9*  --    < > = values in this interval not displayed.   GFR: Estimated Creatinine Clearance: 47.9 mL/min (A) (by C-G formula based on SCr of 1.34 mg/dL (H)). Liver Function Tests: Recent Labs  Lab 07/17/20 0645 07/19/20 0906  AST 18 17  ALT 12 14  ALKPHOS 73  65  BILITOT 0.4 0.9  PROT 6.0* 6.1*  ALBUMIN 3.1* 2.8*   No results for input(s): LIPASE, AMYLASE in the last 168 hours. Recent Labs  Lab 07/19/20 1902  AMMONIA 28   Coagulation Profile: No results for input(s): INR, PROTIME in the last 168 hours. Cardiac Enzymes: No results for input(s): CKTOTAL, CKMB, CKMBINDEX, TROPONINI in the last 168 hours. BNP (last 3 results) No results for input(s): PROBNP in the last 8760 hours. HbA1C: No results for input(s): HGBA1C in the last 72 hours. CBG: Recent Labs  Lab 07/20/20 0722 07/20/20 1135 07/20/20 1711 07/20/20 2113 07/21/20 0735  GLUCAP 85 116* 122* 95 95   Lipid Profile: No results for input(s): CHOL, HDL, LDLCALC, TRIG, CHOLHDL, LDLDIRECT in the last 72 hours. Thyroid Function Tests: Recent Labs    07/19/20 1902  TSH 1.301   Anemia Panel: No results for input(s): VITAMINB12, FOLATE, FERRITIN, TIBC, IRON, RETICCTPCT in the last 72 hours. Sepsis Labs: No results for input(s): PROCALCITON, LATICACIDVEN in the last 168 hours.  Recent Results (from the past 240 hour(s))  SARS CORONAVIRUS 2 (TAT 6-24 HRS) Nasopharyngeal Nasopharyngeal Swab     Status: None   Collection Time: 07/13/20 10:40 AM   Specimen: Nasopharyngeal Swab  Result Value Ref Range Status   SARS Coronavirus 2 NEGATIVE NEGATIVE Final    Comment: (NOTE) SARS-CoV-2 target nucleic acids are NOT DETECTED.  The SARS-CoV-2 RNA is generally detectable in upper and lower respiratory specimens during the acute phase of infection. Negative results do not preclude SARS-CoV-2 infection, do not rule out co-infections with other pathogens, and should not  be used as the sole basis for treatment or other patient management decisions. Negative results must be combined with clinical observations, patient history, and epidemiological information. The expected result is Negative.  Fact Sheet for Patients: SugarRoll.be  Fact Sheet for Healthcare Providers: https://www.woods-mathews.com/  This test is not yet approved or cleared by the [place] FDA and  has been authorized for detection and/or diagnosis of SARS-CoV-2 by FDA under an Emergency Use Authorization (EUA). This EUA will remain  in effect (meaning this test can be used) for the duration of the COVID-19 declaration under Se ction 564(b)(1) of the Act, 21 U.S.C. section 360bbb-3(b)(1), unless the authorization is terminated or revoked sooner.  Performed at [facility], [place] [address] [place], [place] [address]          Radiology Studies: MR BRAIN WO CONTRAST  Result Date: 07/20/2020 CLINICAL DATA:  Altered mental status. EXAM: MRI HEAD WITHOUT CONTRAST TECHNIQUE: Multiplanar, multiecho pulse sequences of the brain and surrounding structures were obtained without intravenous contrast. COMPARISON:  Head CT 07/19/2020 and MRI 02/20/2020 FINDINGS: The study is mildly motion degraded. Brain: There is no evidence of an acute infarct, intracranial hemorrhage, mass, midline shift, or extra-axial fluid collection. A partially empty sella is unchanged from the prior MRI. The ventricles and sulci are normal. No significant white matter disease is seen for age. Vascular: Major intracranial vascular flow voids are preserved. Skull and upper cervical spine: Unremarkable bone marrow signal para Sinuses/Orbits: Bilateral cataract extraction. Minimal sphenoid sinus mucosal thickening. Clear mastoid air cells. Other: None. IMPRESSION: Unchanged and largely unremarkable appearance of the brain. No acute intracranial abnormality. Electronically Signed    By: Logan Bores M.D.   On: 07/20/2020 14:39   DG CHEST PORT 1 VIEW  Result Date: 07/22/2020 CLINICAL DATA:  Hypoxia EXAM: PORTABLE CHEST 1 VIEW COMPARISON:  07/19/2020 chest radiograph. FINDINGS: Right PICC terminates  over the cavoatrial junction. Stable cardiomediastinal silhouette with top-normal heart size. No pneumothorax. No pleural effusion. Improved lung volumes. No pulmonary edema. Minimal residual atelectasis at the left lung base, improved. IMPRESSION: Improved lung volumes with minimal residual left basilar atelectasis. Electronically Signed   By: Ilona Sorrel M.D.   On: 07/22/2020 08:49        Scheduled Meds: . acetaminophen  500 mg Oral Q6H  . vitamin C  500 mg Oral QHS  . aspirin EC  81 mg Oral Daily  . B-complex with vitamin C  1 tablet Oral QHS  . Chlorhexidine Gluconate Cloth  6 each Topical Daily  . cholecalciferol  1,000 Units Oral Daily  . enoxaparin (LOVENOX) injection  40 mg Subcutaneous Q24H  . escitalopram  30 mg Oral Daily  . feeding supplement (GLUCERNA SHAKE)  237 mL Oral BID BM  . furosemide  40 mg Oral BID  . lamoTRIgine  150 mg Oral BID  . lip balm  1 application Topical BID  . magnesium oxide  400 mg Oral Daily  . nadolol  10 mg Oral Daily  . nystatin   Topical BID  . pantoprazole  40 mg Oral Daily  . potassium chloride  40 mEq Oral Daily  . predniSONE  10 mg Oral Q breakfast  . psyllium  1 packet Oral BID  . rosuvastatin  5 mg Oral Daily  . sodium chloride flush  10-40 mL Intracatheter Q12H  . traZODone  200 mg Oral QHS   Continuous Infusions: . methocarbamol (ROBAXIN) IV       LOS: 5 days    Time spent: 30 minutes    Barb Merino, MD [facility] Pager [phone]

## 2020-07-23 ENCOUNTER — Inpatient Hospital Stay (HOSPITAL_COMMUNITY): Payer: Medicare Other

## 2020-07-23 LAB — CBC WITH DIFFERENTIAL/PLATELET
Abs Immature Granulocytes: 0.38 10*3/uL — ABNORMAL HIGH (ref 0.00–0.07)
Basophils Absolute: 0.1 10*3/uL (ref 0.0–0.1)
Basophils Relative: 0 %
Eosinophils Absolute: 0.5 10*3/uL (ref 0.0–0.5)
Eosinophils Relative: 4 %
HCT: 30.2 % — ABNORMAL LOW (ref 36.0–46.0)
Hemoglobin: 9.3 g/dL — ABNORMAL LOW (ref 12.0–15.0)
Immature Granulocytes: 3 %
Lymphocytes Relative: 9 %
Lymphs Abs: 1.3 10*3/uL (ref 0.7–4.0)
MCH: 32 pg (ref 26.0–34.0)
MCHC: 30.8 g/dL (ref 30.0–36.0)
MCV: 103.8 fL — ABNORMAL HIGH (ref 80.0–100.0)
Monocytes Absolute: 1.2 10*3/uL — ABNORMAL HIGH (ref 0.1–1.0)
Monocytes Relative: 9 %
Neutro Abs: 10.3 10*3/uL — ABNORMAL HIGH (ref 1.7–7.7)
Neutrophils Relative %: 75 %
Platelets: 275 10*3/uL (ref 150–400)
RBC: 2.91 MIL/uL — ABNORMAL LOW (ref 3.87–5.11)
RDW: 13.8 % (ref 11.5–15.5)
WBC: 13.7 10*3/uL — ABNORMAL HIGH (ref 4.0–10.5)
nRBC: 0 % (ref 0.0–0.2)

## 2020-07-23 LAB — RESP PANEL BY RT-PCR (FLU A&B, COVID) ARPGX2
Influenza A by PCR: NEGATIVE
Influenza B by PCR: NEGATIVE
SARS Coronavirus 2 by RT PCR: NEGATIVE

## 2020-07-23 LAB — CREATININE, SERUM
Creatinine, Ser: 1.46 mg/dL — ABNORMAL HIGH (ref 0.44–1.00)
GFR, Estimated: 41 mL/min — ABNORMAL LOW (ref >60)

## 2020-07-23 LAB — POTASSIUM: Potassium: 3.7 mmol/L (ref 3.5–5.1)

## 2020-07-23 LAB — CREATININE, FLUID (PLEURAL, PERITONEAL, JP DRAINAGE): Creat, Fluid: 1.6 mg/dL

## 2020-07-23 MED ORDER — HYDROCODONE-ACETAMINOPHEN 10-325 MG PO TABS
0.5000 | ORAL_TABLET | Freq: Four times a day (QID) | ORAL | 0 refills | Status: DC | PRN
Start: 2020-07-23 — End: 2020-08-21

## 2020-07-23 MED ORDER — FUROSEMIDE 20 MG PO TABS
20.0000 mg | ORAL_TABLET | Freq: Every day | ORAL | 1 refills | Status: DC
Start: 2020-07-23 — End: 2022-04-16

## 2020-07-23 MED ORDER — TRAZODONE HCL 100 MG PO TABS: 200.0000 mg | ORAL_TABLET | Freq: Every day | ORAL | 0 refills | Status: AC

## 2020-07-23 NOTE — Progress Notes (Signed)
Patient and family member were given discharge instructions which were then put in the packet to go to [facility]. Report was given to the nurse at the facility.  Patient waiting for PTAR.

## 2020-07-23 NOTE — TOC Transition Note (Signed)
Transition of Care [facility]) - CM/SW Discharge Note   Patient Details  Name: Valerie [name] MRN: [id number] Date of Birth: [birth year]  Transition of Care [facility]) CM/SW Contact:  Lia Hopping, [place] Phone Number: 07/23/2020, 3:16 PM   Clinical Narrative:    Daughter first choice [facility] unable to accept pt. Pt. and daughter chose [facility]F.  CSW confirm [facility] can accept today.  [employer] approval. 3 days, start of care 11/30-12/2. SNF provided details. Reference#[id number] Covid test negative. Facility aware pt. Not fully vaccinated.  Nurse call report to: [phone] Room 111     Final next level of care: Skilled Nursing Facility Barriers to Discharge: No Barriers Identified   Patient Goals and CMS Choice Patient states their goals for this hospitalization and ongoing recovery are:: Get back home CMS Medicare.gov Compare Post Acute Care list provided to:: Patient Represenative (must comment) Valerie [name], daughter) Choice offered to / list presented to : Adult Children  Discharge Placement PASRR number recieved: 07/23/20            Patient chooses bed at: [facility] Patient to be transferred to facility by: [employer] Name of family member notified: Daughter Valerie [name] Patient and family notified of of transfer: 07/23/20  Discharge Plan and Services In-house Referral: Clinical Social Work                                   Social Determinants of Health (SDOH) Interventions     Readmission Risk Interventions No flowsheet data found.

## 2020-07-23 NOTE — Discharge Summary (Addendum)
Physician Discharge Summary    Patient ID: Valerie [name] MRN: [id number] DOB/AGE: 60-May-1962  60 y.o.  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   Anxiety   Obesity, Class III, BMI 40-49.9 (morbid obesity) ([place])   CKD (chronic kidney disease), stage III (HCC)   OSA (obstructive sleep apnea)   Bipolar disorder ([place])   DM type 2 (diabetes mellitus, type 2) (HCC)   CAD (coronary artery disease)   Protein-calorie malnutrition, moderate (HCC)   Anemia, chronic disease   Altered mental status   Acute respiratory failure with hypoxemia ([place])   Hypoxemia   6 Days Post-Op   SURGERY: 07/17/2020  POST-OPERATIVE DIAGNOSIS: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 Diverticulitis with colovesical fistula  Postoperative diagnosis: 1. Diverticulitis with colovesical fistula  Procedure: 1. Cystoscopy 2. Bilateral ureteral cannulation and injection of indocyanine green dye  Surgeon: Pryor Curia.  M.D.  SURGICAL PATHOLOGY   CASE: [id number]  [name]   FINAL MICROSCOPIC DIAGNOSIS:   A. COLON, RECTOSIGMOID, RESECTION:  - Diverticulitis with perforation and pericolonic abscess, clinically  colovesical fistula.  - Two benign lymph nodes.  - No evidence of malignancy.   B. FINAL DISTAL MARGIN:  - Unremarkable colon.  - No evidence of malignancy.    Consults: Internal medicine, physical therapy, Occupational Therapy, Urology  Hospital Course:   Woman with many medical problems including immunosuppression for severe psoriatic arthritis, obesity, sleep apnea, chronic kidney disease, bipolar disease, diabetes, anemia, etc; who developed diverticulitis with urosepsis and colovesical fistula.  Chronic medical admissions with surgical consultation.  Eventually she stabilized.  Placed on optimized parenteral nutrition.  Cleared medically and from cardiac standpoint.  Therefore underwent robotic rectosigmoid low anterior resection with primary repair of persistent bladder fistula and omentopexy on day of admission.   Postoperatively, the patient gradually mobilized and advanced to a solid diet.  Pain and other symptoms were treated aggressively.  She had been given a bolus of steroids intraoperatively.  She was returned to her lower dose of prednisone 10 mg a day.  Medicine was consulted given her medical complexity with multiple medical problems  The patient was deconditioned but eventually mobilized somewhat and at the time of discharge was walking with assistance by PT & nursing in the hallways.  She had flatus & bowel movements.  Decreased appetite but able to tolerate solid diet with supplemental nutritional shakes.  No nausea.  Sleep apnea with CPAP.  She had persistent postoperative hypoxia.  We did aggressive diuresis.  Over 10 L off.  Her oxygen requirements went down but could not be totally weaned off.  Remained off parenteral nutrition.  Baseline mildly elevated  creatinine consistent with her chronic kidney disease.  Pain  was well-controlled on an oral medications.    Patient had cystogram on postoperative day 6 which showed no evidence of any bladder leak.  Pelvic drain fluid creatinine the same as serum arguing against bladder leak as well.  Therefore Foley catheter and drain removed.  Felt no need to have decline anymore.  Based on meeting discharge criteria and continuing to recover, I felt it was safe for the patient to be discharged from the hospital to further recover with close followup.  Internal medicine agreed.  It was felt by nursing and therapy as well as the physicians that she was too deconditioned to be independent of to go home despite close support by her daughter.  Skilled nursing facility with therapy recommended.  That has been arranged.  Postoperative recommendations were discussed in detail.  They are written as well.  Discharged Condition: Fair but stable and improving  Discharge Exam: Blood pressure 121/64, pulse 66, temperature 98.6 F (37 C), temperature source Oral, resp. rate 16, height 5\' 2"  (1.575 m), weight 94.5 kg, SpO2 93 %.  General: Pt awake/alert in no acute distress.  Alert Eyes: PERRL, normal EOM.  Sclera clear.  No icterus Neuro: CN II-XII intact w/o focal sensory/motor deficits. Lymph: No head/neck/groin lymphadenopathy Psych:  No delerium/psychosis/paranoia.  Oriented x 4 HENT: Normocephalic, Mucus membranes moist.  No thrush Neck: Supple, No tracheal deviation.  No obvious thyromegaly Chest: No pain to chest wall compression.  Good respiratory excursion.  No audible wheezing CV:  Pulses intact.  Regular rhythm.  No major extremity edema MS: Normal AROM mjr joints.  No obvious deformity  Abdomen: Soft.  Nondistended.  Nontender.  Incisions - clean dry and intact no evidence of peritonitis.  No incarcerated hernias.  GU: Pfannenstiel incision w scant moisture but no cellulitis/seroma/ecchymosis.  Foley in  place with clear    yellow urine.  Ext:  No deformity.  No mjr edema.  No cyanosis Skin: No petechiae / purpurea.  No major sores.  Warm and dry  Disposition:    Contact information for follow-up providers    Michael Boston, MD. Schedule an appointment as soon as possible for a visit in 3 weeks.   Specialty: General Surgery Why: To follow up after your operation, To follow up after your hospital stay Contact information: [place] [address] [phone]        Otelia Santee I, MD. Schedule an appointment as soon as possible for a visit in 2 week(s).   Specialty: Internal Medicine Contact information: [place] [address] [phone]            Contact information for after-discharge care    Destination    [facility] Preferred SNF .   Service: Skilled Nursing Contact information: [address] [place] [phone]                  Discharge disposition: 03-Skilled [place]       Discharge Instructions    Call MD for:   Complete by: As directed    FEVER > 101.5 F  (temperatures < 101.5 F are not significant)   Call MD for:   Complete by: As directed    FEVER > 101.5 F  (temperatures < 101.5 F are not significant)   Call MD for:  extreme fatigue   Complete by: As directed    Call MD for:  extreme fatigue   Complete by: As directed  Call MD for:  persistant dizziness or light-headedness   Complete by: As directed    Call MD for:  persistant dizziness or light-headedness   Complete by: As directed    Call MD for:  persistant nausea and vomiting   Complete by: As directed    Call MD for:  persistant nausea and vomiting   Complete by: As directed    Call MD for:  redness, tenderness, or signs of infection (pain, swelling, redness, odor or green/yellow discharge around incision site)   Complete by: As directed    Call MD for:  redness, tenderness,  or signs of infection (pain, swelling, redness, odor or green/yellow discharge around incision site)   Complete by: As directed    Call MD for:  severe uncontrolled pain   Complete by: As directed    Call MD for:  severe uncontrolled pain   Complete by: As directed    Diet - low sodium heart healthy   Complete by: As directed    Start with a bland diet such as soups, liquids, starchy foods, low fat foods, etc. the first few days at home. Gradually advance to a solid, low-fat, high fiber diet by the end of the first week at home.   Add a fiber supplement to your diet (Metamucil, etc) If you feel full, bloated, or constipated, stay on a full liquid or pureed/blenderized diet for a few days until you feel better and are no longer constipated.   Discharge instructions   Complete by: As directed    See Discharge Instructions If you are not getting better after two weeks or are noticing you are getting worse, contact our office [phone] for further advice.  We may need to adjust your medications, re-evaluate you in the office, send you to the emergency room, or see what other things we can do to help. The clinic staff is available to answer your questions during regular business hours (8:30am-5pm).  Please don't hesitate to call and ask to speak to one of our nurses for clinical concerns.    A surgeon from [facility] is always on call at the hospitals 24 hours/day If you have a medical emergency, go to the nearest emergency room or call 911.   Discharge instructions   Complete by: As directed    See Discharge Instructions If you are not getting better after two weeks or are noticing you are getting worse, contact our office [phone] for further advice.  We may need to adjust your medications, re-evaluate you in the office, send you to the emergency room, or see what other things we can do to help. The clinic staff is available to answer your questions during regular  business hours (8:30am-5pm).  Please don't hesitate to call and ask to speak to one of our nurses for clinical concerns.    A surgeon from [facility] is always on call at the hospitals 24 hours/day If you have a medical emergency, go to the nearest emergency room or call 911.   Discharge wound care:   Complete by: As directed    It is good for closed incisions and even open wounds to be washed every day.  Shower every day.  Short baths are fine.  Wash the incisions and wounds clean with soap & water.    You may leave closed incisions open to air if it is dry.   You may cover the incision with clean gauze & replace it after your  daily shower for comfort.   Driving Restrictions   Complete by: As directed    You may drive when: - you are no longer taking narcotic prescription pain medication - you can comfortably wear a seatbelt - you can safely make sudden turns/stops without pain.   Driving Restrictions   Complete by: As directed    You may drive when: - you are no longer taking narcotic prescription pain medication - you can comfortably wear a seatbelt - you can safely make sudden turns/stops without pain.   For home use only DME oxygen   Complete by: As directed    Keep oxygen saturations > 90%   Length of Need: 6 Months   Mode or (Route): Nasal cannula   Liters per Minute: 3   Frequency: Continuous (stationary and portable oxygen unit needed)   Oxygen delivery system: Gas   Increase activity slowly   Complete by: As directed    Start light daily activities --- self-care, walking, climbing stairs- beginning the day after surgery.  Gradually increase activities as tolerated.  Control your pain to be active.  Stop when you are tired.  Ideally, walk several times a day, eventually an hour a day.   Most people are back to most day-to-day activities in a few weeks.  It takes 4-6 weeks to get back to unrestricted, intense activity. If you can walk 30 minutes without  difficulty, it is safe to try more intense activity such as jogging, treadmill, bicycling, low-impact aerobics, swimming, etc. Save the most intensive and strenuous activity for last (Usually 4-8 weeks after surgery) such as sit-ups, heavy lifting, contact sports, etc.  Refrain from any intense heavy lifting or straining until you are off narcotics for pain control.  You will have off days, but things should improve week-by-week. DO NOT PUSH THROUGH PAIN.  Let pain be your guide: If it hurts to do something, don't do it.   Increase activity slowly   Complete by: As directed    Start light daily activities --- self-care, walking, climbing stairs- beginning the day after surgery.  Gradually increase activities as tolerated.  Control your pain to be active.  Stop when you are tired.  Ideally, walk several times a day, eventually an hour a day.   Most people are back to most day-to-day activities in a few weeks.  It takes 4-6 weeks to get back to unrestricted, intense activity. If you can walk 30 minutes without difficulty, it is safe to try more intense activity such as jogging, treadmill, bicycling, low-impact aerobics, swimming, etc. Save the most intensive and strenuous activity for last (Usually 4-8 weeks after surgery) such as sit-ups, heavy lifting, contact sports, etc.  Refrain from any intense heavy lifting or straining until you are off narcotics for pain control.  You will have off days, but things should improve week-by-week. DO NOT PUSH THROUGH PAIN.  Let pain be your guide: If it hurts to do something, don't do it.   Lifting restrictions   Complete by: As directed    If you can walk 30 minutes without difficulty, it is safe to try more intense activity such as jogging, treadmill, bicycling, low-impact aerobics, swimming, etc. Save the most intensive and strenuous activity for last (Usually 4-8 weeks after surgery) such as sit-ups, heavy lifting, contact sports, etc.   Refrain from any intense  heavy lifting or straining until you are off narcotics for pain control.  You will have off days, but things should improve week-by-week. DO  NOT PUSH THROUGH PAIN.  Let pain be your guide: If it hurts to do something, don't do it.  Pain is your body warning you to avoid that activity for another week until the pain goes down.   Lifting restrictions   Complete by: As directed    If you can walk 30 minutes without difficulty, it is safe to try more intense activity such as jogging, treadmill, bicycling, low-impact aerobics, swimming, etc. Save the most intensive and strenuous activity for last (Usually 4-8 weeks after surgery) such as sit-ups, heavy lifting, contact sports, etc.   Refrain from any intense heavy lifting or straining until you are off narcotics for pain control.  You will have off days, but things should improve week-by-week. DO NOT PUSH THROUGH PAIN.  Let pain be your guide: If it hurts to do something, don't do it.  Pain is your body warning you to avoid that activity for another week until the pain goes down.   May shower / Bathe   Complete by: As directed    May shower / Bathe   Complete by: As directed    May walk up steps   Complete by: As directed    May walk up steps   Complete by: As directed    Remove dressing in 72 hours   Complete by: As directed    Make sure all dressings are removed by the third day after surgery.  Leave incisions open to air.  OK to cover incisions with gauze or bandages as desired   Sexual Activity Restrictions   Complete by: As directed    You may have sexual intercourse when it is comfortable. If it hurts to do something, stop.   Sexual Activity Restrictions   Complete by: As directed    You may have sexual intercourse when it is comfortable. If it hurts to do something, stop.      Allergies as of 07/23/2020      Reactions   Carbamazepine Other (See Comments)   Parkinsons like symptoms tremors   Sertraline Hcl Other (See Comments)    Unknown reaction      Medication List    TAKE these medications   ALPRAZolam 1 MG tablet Commonly known as: XANAX Take 1 mg by mouth 3 (three) times daily as needed for anxiety (depression.).   aspirin EC 81 MG tablet Take 1 tablet (81 mg total) by mouth daily.   b complex vitamins tablet Take 2 tablets by mouth at bedtime.   clopidogrel 75 MG tablet Commonly known as: PLAVIX Take 75 mg by mouth daily.   docusate sodium 100 MG capsule Commonly known as: COLACE Take 100-200 mg by mouth at bedtime.   donepezil 10 MG tablet Commonly known as: ARICEPT Take 10 mg by mouth daily.   escitalopram 20 MG tablet Commonly known as: LEXAPRO Take 30 mg by mouth daily.   furosemide 20 MG tablet Commonly known as: LASIX Take 1 tablet (20 mg total) by mouth daily. What changed: when to take this   HYDROcodone-acetaminophen 10-325 MG tablet Commonly known as: NORCO Take 1 tablet by mouth 2 (two) times daily as needed (pain).   lamoTRIgine 150 MG tablet Commonly known as: LAMICTAL Take 150 mg by mouth 2 (two) times daily.   nadolol 20 MG tablet Commonly known as: Corgard Take 0.5 tablets (10 mg total) by mouth daily.   nitroGLYCERIN 0.4 MG SL tablet Commonly known as: NITROSTAT Place 1 tablet (0.4 mg total) under the tongue every  5 (five) minutes as needed for chest pain.   omeprazole 20 MG capsule Commonly known as: PRILOSEC Take 1 capsule (20 mg total) by mouth 2 (two) times daily before a meal.   ondansetron 8 MG tablet Commonly known as: ZOFRAN Take 8 mg by mouth 2 (two) times daily as needed for nausea or vomiting.   predniSONE 10 MG tablet Commonly known as: DELTASONE Take 5-10 mg by mouth See admin instructions. Take 5 mg by mouth every other day, alternating with 10 mg on opposing days   rizatriptan 10 MG disintegrating tablet Commonly known as: MAXALT-MLT Take 10 mg by mouth See admin instructions. Take one tablet (10 mg) by mouth daily as needed for  "floaters", May repeat in 2 hours if still needed   rosuvastatin 5 MG tablet Commonly known as: CRESTOR Take 1 tablet (5 mg total) by mouth daily.   traZODone 100 MG tablet Commonly known as: DESYREL Take 200 mg by mouth at bedtime.   vitamin C 1000 MG tablet Take 1,000 mg by mouth at bedtime.   Vitamin D-3 25 MCG (1000 UT) Caps Take 1,000 Units by mouth daily.            Durable Medical Equipment  (From admission, onward)         Start     Ordered   07/23/20 0000  For home use only DME oxygen       Comments: Keep oxygen saturations > 90%  Question Answer Comment  Length of Need 6 Months   Mode or (Route) Nasal cannula   Liters per Minute 3   Frequency Continuous (stationary and portable oxygen unit needed)   Oxygen delivery system Gas      07/23/20 0714           Discharge Care Instructions  (From admission, onward)         Start     Ordered   07/23/20 0000  Discharge wound care:       Comments: It is good for closed incisions and even open wounds to be washed every day.  Shower every day.  Short baths are fine.  Wash the incisions and wounds clean with soap & water.    You may leave closed incisions open to air if it is dry.   You may cover the incision with clean gauze & replace it after your daily shower for comfort.   07/23/20 0711          Significant Diagnostic Studies:  Results for orders placed or performed during the hospital encounter of 07/17/20 (from the past 72 hour(s))  Glucose, capillary     Status: Abnormal   Collection Time: 07/20/20  5:11 PM  Result Value Ref Range   Glucose-Capillary 122 (H) 70 - 99 mg/dL    Comment: Glucose reference range applies only to samples taken after fasting for at least 8 hours.  Glucose, capillary     Status: None   Collection Time: 07/20/20  9:13 PM  Result Value Ref Range   Glucose-Capillary 95 70 - 99 mg/dL    Comment: Glucose reference range applies only to samples taken after fasting for at least  8 hours.   Comment 1 Notify RN   CBC with Differential/Platelet     Status: Abnormal   Collection Time: 07/21/20  3:25 AM  Result Value Ref Range   WBC 12.0 (H) 4.0 - 10.5 K/uL   RBC 2.67 (L) 3.87 - 5.11 MIL/uL   Hemoglobin 8.6 (L) 12.0 -  15.0 g/dL   HCT 27.8 (L) 36 - 46 %   MCV 104.1 (H) 80.0 - 100.0 fL   MCH 32.2 26.0 - 34.0 pg   MCHC 30.9 30.0 - 36.0 g/dL   RDW 13.6 11.5 - 15.5 %   Platelets 246 150 - 400 K/uL   nRBC 0.0 0.0 - 0.2 %   Neutrophils Relative % 82 %   Neutro Abs 9.8 (H) 1.7 - 7.7 K/uL   Lymphocytes Relative 8 %   Lymphs Abs 1.0 0.7 - 4.0 K/uL   Monocytes Relative 7 %   Monocytes Absolute 0.8 0.1 - 1.0 K/uL   Eosinophils Relative 2 %   Eosinophils Absolute 0.3 0.0 - 0.5 K/uL   Basophils Relative 0 %   Basophils Absolute 0.0 0.0 - 0.1 K/uL   Immature Granulocytes 1 %   Abs Immature Granulocytes 0.15 (H) 0.00 - 0.07 K/uL    Comment: Performed at [facility], [place] [address], [place], [place] [address]  Basic metabolic panel     Status: Abnormal   Collection Time: 07/21/20  3:25 AM  Result Value Ref Range   Sodium 139 135 - 145 mmol/L   Potassium 3.4 (L) 3.5 - 5.1 mmol/L   Chloride 99 98 - 111 mmol/L   CO2 32 22 - 32 mmol/L   Glucose, Bld 82 70 - 99 mg/dL    Comment: Glucose reference range applies only to samples taken after fasting for at least 8 hours.   BUN 16 6 - 20 mg/dL   Creatinine, Ser 1.24 (H) 0.44 - 1.00 mg/dL   Calcium 11.4 (H) 8.9 - 10.3 mg/dL   GFR, Estimated 50 (L) >60 mL/min    Comment: (NOTE) Calculated using the CKD-EPI Creatinine Equation (2021)    Anion gap 8 5 - 15    Comment: Performed at [facility], [place] [address] [place], [place] [address]  Magnesium     Status: None   Collection Time: 07/21/20  3:25 AM  Result Value Ref Range   Magnesium 1.8 1.7 - 2.4 mg/dL    Comment: Performed at [facility], [place] [address], [place], [place] [address]  Phosphorus     Status: Abnormal    Collection Time: 07/21/20  3:25 AM  Result Value Ref Range   Phosphorus 1.9 (L) 2.5 - 4.6 mg/dL    Comment: Performed at [facility], [place] [address] [place], [place] [address]  Glucose, capillary     Status: None   Collection Time: 07/21/20  7:35 AM  Result Value Ref Range   Glucose-Capillary 95 70 - 99 mg/dL    Comment: Glucose reference range applies only to samples taken after fasting for at least 8 hours.  Prealbumin     Status: Abnormal   Collection Time: 07/21/20 11:53 PM  Result Value Ref Range   Prealbumin 12.0 (L) 18 - 38 mg/dL    Comment: Performed at [facility], [place] [address] [place], [place] [address]  CBC with Differential/Platelet     Status: Abnormal   Collection Time: 07/22/20  3:17 AM  Result Value Ref Range   WBC 12.3 (H) 4.0 - 10.5 K/uL   RBC 2.78 (L) 3.87 - 5.11 MIL/uL   Hemoglobin 8.9 (L) 12.0 - 15.0 g/dL   HCT 28.8 (L) 36 - 46 %   MCV 103.6 (H) 80.0 - 100.0 fL   MCH 32.0 26.0 - 34.0 pg   MCHC 30.9 30.0 - 36.0  g/dL   RDW 13.6 11.5 - 15.5 %   Platelets 261 150 - 400 K/uL   nRBC 0.0 0.0 - 0.2 %   Neutrophils Relative % 77 %   Neutro Abs 9.4 (H) 1.7 - 7.7 K/uL   Lymphocytes Relative 9 %   Lymphs Abs 1.1 0.7 - 4.0 K/uL   Monocytes Relative 9 %   Monocytes Absolute 1.1 (H) 0.1 - 1.0 K/uL   Eosinophils Relative 3 %   Eosinophils Absolute 0.3 0.0 - 0.5 K/uL   Basophils Relative 0 %   Basophils Absolute 0.0 0.0 - 0.1 K/uL   Immature Granulocytes 2 %   Abs Immature Granulocytes 0.30 (H) 0.00 - 0.07 K/uL    Comment: Performed at [facility], [place] [address], [place], [place] [address]  Potassium     Status: None   Collection Time: 07/22/20  3:17 AM  Result Value Ref Range   Potassium 3.5 3.5 - 5.1 mmol/L    Comment: Performed at [facility], [place] [address] [place], [place] [address]  Creatinine, serum     Status: Abnormal   Collection Time: 07/22/20  3:17 AM  Result Value  Ref Range   Creatinine, Ser 1.34 (H) 0.44 - 1.00 mg/dL   GFR, Estimated 45 (L) >60 mL/min    Comment: (NOTE) Calculated using the CKD-EPI Creatinine Equation (2021) Performed at [facility], [place] [address] [place], [place] [address]   CBC with Differential/Platelet     Status: Abnormal   Collection Time: 07/23/20  3:44 AM  Result Value Ref Range   WBC 13.7 (H) 4.0 - 10.5 K/uL   RBC 2.91 (L) 3.87 - 5.11 MIL/uL   Hemoglobin 9.3 (L) 12.0 - 15.0 g/dL   HCT 30.2 (L) 36 - 46 %   MCV 103.8 (H) 80.0 - 100.0 fL   MCH 32.0 26.0 - 34.0 pg   MCHC 30.8 30.0 - 36.0 g/dL   RDW 13.8 11.5 - 15.5 %   Platelets 275 150 - 400 K/uL   nRBC 0.0 0.0 - 0.2 %   Neutrophils Relative % 75 %   Neutro Abs 10.3 (H) 1.7 - 7.7 K/uL   Lymphocytes Relative 9 %   Lymphs Abs 1.3 0.7 - 4.0 K/uL   Monocytes Relative 9 %   Monocytes Absolute 1.2 (H) 0.1 - 1.0 K/uL   Eosinophils Relative 4 %   Eosinophils Absolute 0.5 0.0 - 0.5 K/uL   Basophils Relative 0 %   Basophils Absolute 0.1 0.0 - 0.1 K/uL   Immature Granulocytes 3 %   Abs Immature Granulocytes 0.38 (H) 0.00 - 0.07 K/uL    Comment: Performed at [facility], [place] [address] [place], [place] [address]  Potassium     Status: None   Collection Time: 07/23/20  3:44 AM  Result Value Ref Range   Potassium 3.7 3.5 - 5.1 mmol/L    Comment: Performed at [facility], [place] [address], [place], [place] [address]  Creatinine, serum     Status: Abnormal   Collection Time: 07/23/20  3:44 AM  Result Value Ref Range   Creatinine, Ser 1.46 (H) 0.44 - 1.00 mg/dL   GFR, Estimated 41 (L) >60 mL/min    Comment: (NOTE) Calculated using the CKD-EPI Creatinine Equation (2021) Performed at [facility], [place] [address], [place], [place] [address]   Creatinine, fluid (JP Drainage)     Status: None   Collection Time: 07/23/20  8:15  AM  Result Value Ref Range   Creat, Fluid 1.6 mg/dL    Comment:  (NOTE) No normal range established for this test Results should be evaluated in conjunction with serum values    Fluid Type-FCRE JP DRAINAGE     Comment: Performed at [facility], [place] [address] [place], [place] [address]  Resp Panel by RT-PCR (Flu A&B, Covid) Nasopharyngeal Swab     Status: None   Collection Time: 07/23/20  9:53 AM   Specimen: Nasopharyngeal Swab; Nasopharyngeal(NP) swabs in vial transport medium  Result Value Ref Range   SARS Coronavirus 2 by RT PCR NEGATIVE NEGATIVE    Comment: (NOTE) SARS-CoV-2 target nucleic acids are NOT DETECTED.  The SARS-CoV-2 RNA is generally detectable in upper respiratory specimens during the acute phase of infection. The lowest concentration of SARS-CoV-2 viral copies this assay can detect is 138 copies/mL. A negative result does not preclude SARS-Cov-2 infection and should not be used as the sole basis for treatment or other patient management decisions. A negative result may occur with  improper specimen collection/handling, submission of specimen other than nasopharyngeal swab, presence of viral mutation(s) within the areas targeted by this assay, and inadequate number of viral copies(<138 copies/mL). A negative result must be combined with clinical observations, patient history, and epidemiological information. The expected result is Negative.  Fact Sheet for Patients:  EntrepreneurPulse.com.au  Fact Sheet for Healthcare Providers:  IncredibleEmployment.be  This test is no t yet approved or cleared by the [place] FDA and  has been authorized for detection and/or diagnosis of SARS-CoV-2 by FDA under an Emergency Use Authorization (EUA). This EUA will remain  in effect (meaning this test can be used) for the duration of the COVID-19 declaration under Section 564(b)(1) of the Act, 21 U.S.C.section 360bbb-3(b)(1), unless the authorization is terminated  or revoked  sooner.       Influenza A by PCR NEGATIVE NEGATIVE   Influenza B by PCR NEGATIVE NEGATIVE    Comment: (NOTE) The Xpert Xpress SARS-CoV-2/FLU/RSV plus assay is intended as an aid in the diagnosis of influenza from Nasopharyngeal swab specimens and should not be used as a sole basis for treatment. Nasal washings and aspirates are unacceptable for Xpert Xpress SARS-CoV-2/FLU/RSV testing.  Fact Sheet for Patients: EntrepreneurPulse.com.au  Fact Sheet for Healthcare Providers: IncredibleEmployment.be  This test is not yet approved or cleared by the [place] FDA and has been authorized for detection and/or diagnosis of SARS-CoV-2 by FDA under an Emergency Use Authorization (EUA). This EUA will remain in effect (meaning this test can be used) for the duration of the COVID-19 declaration under Section 564(b)(1) of the Act, 21 U.S.C. section 360bbb-3(b)(1), unless the authorization is terminated or revoked.  Performed at [facility], [place] [address] [place], [place] [address]     No results found.  Past Medical History:  Diagnosis Date  . Anemia   . Anxiety   . Arthritis    Psoriatic  . Bipolar disorder ([place])   . CKD (chronic kidney disease), stage III ([place])   . Colovesical fistula   . Depression   . DM type 2 (diabetes mellitus, type 2) ([place]) 11/14/2019  . Dyspnea   . Headache   . Neuropathy   . NSTEMI (non-ST elevated myocardial infarction) ([place]) 11/14/2019  . OSA (obstructive sleep apnea)   . Psoriatic arthritis ([place])   . Sleep apnea   . Stroke [facility])    Memory issues, March 2021    Past Surgical  History:  Procedure Laterality Date  . APPENDECTOMY    . CHOLECYSTECTOMY    . CYSTOSCOPY WITH STENT PLACEMENT N/A 07/17/2020   Procedure: CYSTOSCOPY WITH BILATERAL FIREFLY INJECTION;  Surgeon: Raynelle Bring, MD;  Location: [facility];  Service: Urology;  Laterality: N/A;  . FLEXIBLE SIGMOIDOSCOPY N/A 05/17/2020    Procedure: FLEXIBLE SIGMOIDOSCOPY;  Surgeon: Carol Ada, MD;  Location: [facility] END[facility]COPY;  Service: Endoscopy;  Laterality: N/A;  . FOOT SURGERY Right   . LEFT HEART CATH AND CORONARY ANGIOGRAPHY N/A 11/14/2019   Procedure: LEFT HEART CATH AND CORONARY ANGIOGRAPHY;  Surgeon: Martinique, Peter M, MD;  Location: [place] [facility];  Service: Cardiovascular;  Laterality: N/A;  . PROCTOSCOPY N/A 07/17/2020   Procedure: RIGID PROCTOSCOPY;  Surgeon: Michael Boston, MD;  Location: [facility];  Service: General;  Laterality: N/A;  . TONSILLECTOMY    . TOTAL HIP ARTHROPLASTY Bilateral     Social History   Socioeconomic History  . Marital status: Divorced    Spouse name: Not on file  . Number of children: Not on file  . Years of education: Not on file  . Highest education level: Not on file  Occupational History  . Not on file  Tobacco Use  . Smoking status: Current Every Day Smoker    Packs/day: 0.50  . Smokeless tobacco: Never Used  Vaping Use  . Vaping Use: Never used  Substance and Sexual Activity  . Alcohol use: Not Currently  . Drug use: Never  . Sexual activity: Not on file  Other Topics Concern  . Not on file  Social History Narrative  . Not on file   Social Determinants of Health   Financial Resource Strain:   . Difficulty of Paying Living Expenses: Not on file  Food Insecurity:   . Worried About Charity fundraiser in the Last Year: Not on file  . Ran Out of Food in the Last Year: Not on file  Transportation Needs:   . Lack of Transportation (Medical): Not on file  . Lack of Transportation (Non-Medical): Not on file  Physical Activity:   . Days of Exercise per Week: Not on file  . Minutes of Exercise per Session: Not on file  Stress:   . Feeling of Stress : Not on file  Social Connections:   . Frequency of Communication with Friends and Family: Not on file  . Frequency of Social Gatherings with Friends and Family: Not on file  . Attends Religious Services: Not on file  .  Active Member of Clubs or Organizations: Not on file  . Attends Archivist Meetings: Not on file  . Marital Status: Not on file  Intimate Partner Violence:   . Fear of Current or Ex-Partner: Not on file  . Emotionally Abused: Not on file  . Physically Abused: Not on file  . Sexually Abused: Not on file    Family History  Problem Relation Age of Onset  . Breast cancer Neg Hx     Current Facility-Administered Medications  Medication Dose Route Frequency Provider Last Rate Last Admin  . acetaminophen (TYLENOL) tablet 500 mg  500 mg Oral Lajuana Ripple, MD   500 mg at 07/23/20 1139  . ALPRAZolam Duanne Moron) tablet 1 mg  1 mg Oral TID PRN Michael Boston, MD   1 mg at 07/18/20 0526  . alum & mag hydroxide-simeth (MAALOX/MYLANTA) 200-200-20 MG/5ML suspension 30 mL  30 mL Oral Q6H PRN Michael Boston, MD      . ascorbic  acid (VITAMIN C) tablet 500 mg  500 mg Oral Ardeen Fillers, MD   500 mg at 07/22/20 2145  . aspirin EC tablet 81 mg  81 mg Oral Daily Michael Boston, MD   81 mg at 07/23/20 1139  . B-complex with vitamin C tablet 1 tablet  1 tablet Oral Ardeen Fillers, MD   1 tablet at 07/22/20 2145  . Chlorhexidine Gluconate Cloth 2 % PADS 6 each  6 each Topical Daily Michael Boston, MD   6 each at 07/23/20 1139  . cholecalciferol (VITAMIN D) tablet 1,000 Units  1,000 Units Oral Daily Michael Boston, MD   1,000 Units at 07/23/20 1136  . enalaprilat (VASOTEC) injection 0.625-1.25 mg  0.625-1.25 mg Intravenous Q6H PRN Michael Boston, MD      . enoxaparin (LOVENOX) injection 40 mg  40 mg Subcutaneous Q24H Michael Boston, MD   40 mg at 07/23/20 0802  . escitalopram (LEXAPRO) tablet 30 mg  30 mg Oral Daily Michael Boston, MD   30 mg at 07/23/20 1138  . feeding supplement (GLUCERNA SHAKE) (GLUCERNA SHAKE) liquid 237 mL  237 mL Oral BID BM Michael Boston, MD   237 mL at 07/23/20 1352  . fentaNYL (SUBLIMAZE) injection 25-50 mcg  25-50 mcg Intravenous Q2H PRN Michael Boston, MD      . furosemide  (LASIX) tablet 40 mg  40 mg Oral BID Michael Boston, MD   40 mg at 07/23/20 0802  . HYDROcodone-acetaminophen (NORCO) 10-325 MG per tablet 0.5-1 tablet  0.5-1 tablet Oral Q6H PRN Michael Boston, MD   1 tablet at 07/23/20 [phone]  . lamoTRIgine (LAMICTAL) tablet 150 mg  150 mg Oral BID Michael Boston, MD   150 mg at 07/23/20 1136  . lip balm (CARMEX) ointment 1 application  1 application Topical BID Michael Boston, MD   1 application at 25/85/27 1140  . magic mouthwash  15 mL Oral QID PRN Michael Boston, MD      . magnesium oxide (MAG-OX) tablet 400 mg  400 mg Oral Daily Barb Merino, MD   400 mg at 07/23/20 1138  . methocarbamol (ROBAXIN) 1,000 mg in dextrose 5 % 100 mL IVPB  1,000 mg Intravenous Q6H PRN Michael Boston, MD      . methocarbamol (ROBAXIN) tablet 1,000 mg  1,000 mg Oral Q6H PRN Michael Boston, MD      . metoprolol tartrate (LOPRESSOR) injection 5 mg  5 mg Intravenous Q6H PRN Michael Boston, MD      . nadolol (CORGARD) tablet 10 mg  10 mg Oral Daily Michael Boston, MD   10 mg at 07/23/20 1135  . nitroGLYCERIN (NITROSTAT) SL tablet 0.4 mg  0.4 mg Sublingual Q5 min PRN Michael Boston, MD      . nystatin (MYCOSTATIN/NYSTOP) topical powder   Topical BID Michael Boston, MD   Given at 07/23/20 1351  . ondansetron (ZOFRAN) tablet 4 mg  4 mg Oral Q6H PRN Michael Boston, MD       Or  . ondansetron (ZOFRAN) injection 4 mg  4 mg Intravenous Q6H PRN Michael Boston, MD      . pantoprazole (PROTONIX) EC tablet 40 mg  40 mg Oral Daily Michael Boston, MD   40 mg at 07/23/20 1134  . predniSONE (DELTASONE) tablet 10 mg  10 mg Oral Q breakfast Michael Boston, MD   10 mg at 07/23/20 0802  . prochlorperazine (COMPAZINE) tablet 10 mg  10 mg Oral Q6H PRN Michael Boston, MD  Or  . prochlorperazine (COMPAZINE) injection 5-10 mg  5-10 mg Intravenous Q6H PRN Michael Boston, MD      . psyllium (HYDROCIL/METAMUCIL) 1 packet  1 packet Oral BID Michael Boston, MD   1 packet at 07/23/20 1140  . rosuvastatin (CRESTOR) tablet 5  mg  5 mg Oral Daily Michael Boston, MD   5 mg at 07/23/20 1138  . sodium chloride flush (NS) 0.9 % injection 10-40 mL  10-40 mL Intracatheter Gorden Harms, MD   10 mL at 07/22/20 2145  . sodium chloride flush (NS) 0.9 % injection 10-40 mL  10-40 mL Intracatheter PRN Michael Boston, MD      . traZODone (DESYREL) tablet 200 mg  200 mg Oral Ardeen Fillers, MD   200 mg at 07/22/20 2145     Allergies  Allergen Reactions  . Carbamazepine Other (See Comments)    Parkinsons like symptoms tremors  . Sertraline Hcl Other (See Comments)    Unknown reaction    Signed: Morton Peters, MD, FACS, MASCRS Gastrointestinal and Minimally Invasive Surgery  [facility] [address] [place], [place] [address] [phone] Fax [phone] Main/Paging  CONTACT INFORMATION: Weekday (9AM-5PM) concerns: Call [facility] main office at [phone] Weeknight (5PM-9AM) or Weekend/Holiday concerns: Check [email or web address] for General Surgery [facility] coverage (Please, do not use SecureChat as it is not reliable communication to operating surgeons for immediate patient care)      07/23/2020, 2:07 PM

## 2020-07-23 NOTE — Discharge Instructions (Signed)
Follow-up with your surgeon to make sure you continue to recover.  Dr. Johney Maine at [facility] surgery.  Follow-up with your primary care physician to follow-up on your health status including sleep apnea, oxygen, psoriatic arthritis, etc.  Stay on low-dose prednisone for the next several weeks.  Can consider restarting Enbrel or other immunosuppressive medication with your rheumatologist when you are more than 4 weeks out from surgery  SURGERY: POST OP INSTRUCTIONS (Surgery for small bowel obstruction, colon resection, etc)   ######################################################################  EAT Gradually transition to a high fiber diet with a fiber supplement over the next few days after discharge  WALK Walk an hour a day.  Control your pain to do that.    CONTROL PAIN Control pain so that you can walk, sleep, tolerate sneezing/coughing, go up/down stairs.  HAVE A BOWEL MOVEMENT DAILY Keep your bowels regular to avoid problems.  OK to try a laxative to override constipation.  OK to use an antidairrheal to slow down diarrhea.  Call if not better after 2 tries  CALL IF YOU HAVE PROBLEMS/CONCERNS Call if you are still struggling despite following these instructions. Call if you have concerns not answered by these instructions  ######################################################################   DIET Follow a light diet the first few days at home.  Start with a bland diet such as soups, liquids, starchy foods, low fat foods, etc.  If you feel full, bloated, or constipated, stay on a ful liquid or pureed/blenderized diet for a few days until you feel better and no longer constipated. Be sure to drink plenty of fluids every day to avoid getting dehydrated (feeling dizzy, not urinating, etc.). Gradually add a fiber supplement to your diet over the next week.  Gradually get back to a regular solid diet.  Avoid fast food or heavy meals the first week as you are more likely to  get nauseated. It is expected for your digestive tract to need a few months to get back to normal.  It is common for your bowel movements and stools to be irregular.  You will have occasional bloating and cramping that should eventually fade away.  Until you are eating solid food normally, off all pain medications, and back to regular activities; your bowels will not be normal. Focus on eating a low-fat, high fiber diet the rest of your life (See Getting to [place], below).  CARE of your INCISION or WOUND It is good for closed incision and even open wounds to be washed every day.  Shower every day.  Short baths are fine.  Wash the incisions and wounds clean with soap & water.    If you have a closed incision(s), wash the incision with soap & water every day.  You may leave closed incisions open to air if it is dry.   You may cover the incision with clean gauze & replace it after your daily shower for comfort. If you have skin tapes (Steristrips) or skin glue (Dermabond) on your incision, leave them in place.  They will fall off on their own like a scab.  You may trim any edges that curl up with clean scissors.  If you have staples, set up an appointment for them to be removed in the office in 10 days after surgery.  If you have a drain, wash around the skin exit site with soap & water and place a new dressing of gauze or band aid around the skin every day.  Keep the drain site clean & dry.  If you have an open wound with packing, see wound care instructions.  In general, it is encouraged that you remove your dressing and packing, shower with soap & water, and replace your dressing once a day.  Pack the wound with clean gauze moistened with normal (0.9%) saline to keep the wound moist & uninfected.  Pressure on the dressing for 30 minutes will stop most wound bleeding.  Eventually your body will heal & pull the open wound closed over the next few months.  Raw open wounds will occasionally bleed  or secrete yellow drainage until it heals closed.  Drain sites will drain a little until the drain is removed.  Even closed incisions can have mild bleeding or drainage the first few days until the skin edges scab over & seal.   If you have an open wound with a wound vac, see wound vac care instructions.     ACTIVITIES as tolerated Start light daily activities --- self-care, walking, climbing stairs-- beginning the day after surgery.  Gradually increase activities as tolerated.  Control your pain to be active.  Stop when you are tired.  Ideally, walk several times a day, eventually an hour a day.   Most people are back to most day-to-day activities in a few weeks.  It takes 4-8 weeks to get back to unrestricted, intense activity. If you can walk 30 minutes without difficulty, it is safe to try more intense activity such as jogging, treadmill, bicycling, low-impact aerobics, swimming, etc. Save the most intensive and strenuous activity for last (Usually 4-8 weeks after surgery) such as sit-ups, heavy lifting, contact sports, etc.  Refrain from any intense heavy lifting or straining until you are off narcotics for pain control.  You will have off days, but things should improve week-by-week. DO NOT PUSH THROUGH PAIN.  Let pain be your guide: If it hurts to do something, don't do it.  Pain is your body warning you to avoid that activity for another week until the pain goes down. You may drive when you are no longer taking narcotic prescription pain medication, you can comfortably wear a seatbelt, and you can safely make sudden turns/stops to protect yourself without hesitating due to pain. You may have sexual intercourse when it is comfortable. If it hurts to do something, stop.  MEDICATIONS Take your usually prescribed home medications unless otherwise directed.   Blood thinners:  Usually you can restart any strong blood thinners after the second postoperative day.  It is OK to take aspirin right  away.     If you are on strong blood thinners (warfarin/Coumadin, Plavix, Xerelto, Eliquis, Pradaxa, etc), discuss with your surgeon, medicine PCP, and/or cardiologist for instructions on when to restart the blood thinner & if blood monitoring is needed (PT/INR blood check, etc).     PAIN CONTROL Pain after surgery or related to activity is often due to strain/injury to muscle, tendon, nerves and/or incisions.  This pain is usually short-term and will improve in a few months.  To help speed the process of healing and to get back to regular activity more quickly, DO THE FOLLOWING THINGS TOGETHER: 1. Increase activity gradually.  DO NOT PUSH THROUGH PAIN 2. Use Ice and/or Heat 3. Try Gentle Massage and/or Stretching 4. Take over the counter pain medication 5. Take Narcotic prescription pain medication for more severe pain  Good pain control = faster recovery.  It is better to take more medicine to be more active than to stay in bed all  day to avoid medications. 1.  Increase activity gradually Avoid heavy lifting at first, then increase to lifting as tolerated over the next 6 weeks. Do not "push through" the pain.  Listen to your body and avoid positions and maneuvers than reproduce the pain.  Wait a few days before trying something more intense Walking an hour a day is encouraged to help your body recover faster and more safely.  Start slowly and stop when getting sore.  If you can walk 30 minutes without stopping or pain, you can try more intense activity (running, jogging, aerobics, cycling, swimming, treadmill, sex, sports, weightlifting, etc.) Remember: If it hurts to do it, then don't do it! 2. Use Ice and/or Heat You will have swelling and bruising around the incisions.  This will take several weeks to resolve. Ice packs or heating pads (6-8 times a day, 30-60 minutes at a time) will help sooth soreness & bruising. Some people prefer to use ice alone, heat alone, or alternate between ice &  heat.  Experiment and see what works best for you.  Consider trying ice for the first few days to help decrease swelling and bruising; then, switch to heat to help relax sore spots and speed recovery. Shower every day.  Short baths are fine.  It feels good!  Keep the incisions and wounds clean with soap & water.   3. Try Gentle Massage and/or Stretching Massage at the area of pain many times a day Stop if you feel pain - do not overdo it 4. Take over the counter pain medication This helps the muscle and nerve tissues become less irritable and calm down faster Choose ONE of the following over-the-counter anti-inflammatory medications: Acetaminophen 500mg  tabs (Tylenol) 1-2 pills with every meal and just before bedtime (avoid if you have liver problems or if you have acetaminophen in you narcotic prescription) Naproxen 220mg  tabs (ex. Aleve, Naprosyn) 1-2 pills twice a day (avoid if you have kidney, stomach, IBD, or bleeding problems) Ibuprofen 200mg  tabs (ex. Advil, Motrin) 3-4 pills with every meal and just before bedtime (avoid if you have kidney, stomach, IBD, or bleeding problems) Take with food/snack several times a day as directed for at least 2 weeks to help keep pain / soreness down & more manageable. 5. Take Narcotic prescription pain medication for more severe pain A prescription for strong pain control is often given to you upon discharge (for example: oxycodone/Percocet, hydrocodone/Norco/Vicodin, or tramadol/Ultram) Take your pain medication as prescribed. Be mindful that most narcotic prescriptions contain Tylenol (acetaminophen) as well - avoid taking too much Tylenol. If you are having problems/concerns with the prescription medicine (does not control pain, nausea, vomiting, rash, itching, etc.), please call us [phone] to see if we need to switch you to a different pain medicine that will work better for you and/or control your side effects better. If you need a refill on your  pain medication, you must call the office before 4 pm and on weekdays only.  By federal law, prescriptions for narcotics cannot be called into a pharmacy.  They must be filled out on paper & picked up from our office by the patient or authorized caretaker.  Prescriptions cannot be filled after 4 pm nor on weekends.    WHEN TO CALL us [phone] Severe uncontrolled or worsening pain  Fever over 101 F (38.5 C) Concerns with the incision: Worsening pain, redness, rash/hives, swelling, bleeding, or drainage Reactions / problems with new medications (itching, rash, hives, nausea, etc.) Nausea  and/or vomiting Difficulty urinating Difficulty breathing Worsening fatigue, dizziness, lightheadedness, blurred vision Other concerns If you are not getting better after two weeks or are noticing you are getting worse, contact our office [phone] for further advice.  We may need to adjust your medications, re-evaluate you in the office, send you to the emergency room, or see what other things we can do to help. The clinic staff is available to answer your questions during regular business hours (8:30am-5pm).  Please don't hesitate to call and ask to speak to one of our nurses for clinical concerns.    A surgeon from [facility] is always on call at the hospitals 24 hours/day If you have a medical emergency, go to the nearest emergency room or call 911.  FOLLOW UP in our office One the day of your discharge from the hospital (or the next business weekday), please call [facility] to set up or confirm an appointment to see your surgeon in the office for a follow-up appointment.  Usually it is 2-3 weeks after your surgery.   If you have skin staples at your incision(s), let the office know so we can set up a time in the office for the nurse to remove them (usually around 10 days after surgery). Make sure that you call for appointments the day of discharge (or the next business  weekday) from the hospital to ensure a convenient appointment time. IF YOU HAVE DISABILITY OR FAMILY LEAVE FORMS, BRING THEM TO THE OFFICE FOR PROCESSING.  DO NOT GIVE THEM TO YOUR DOCTOR.  [facility] [address], [place], [place], [place]  [address] ? [phone] - Main [phone] - [place],  [phone] - Fax [email or web address]  GETTING TO GOOD BOWEL HEALTH. It is expected for your digestive tract to need a few months to get back to normal.  It is common for your bowel movements and stools to be irregular.  You will have occasional bloating and cramping that should eventually fade away.  Until you are eating solid food normally, off all pain medications, and back to regular activities; your bowels will not be normal.   Avoiding constipation The goal: ONE SOFT BOWEL MOVEMENT A DAY!    Drink plenty of fluids.  Choose water first. TAKE A FIBER SUPPLEMENT EVERY DAY THE REST OF YOUR LIFE During your first week back home, gradually add back a fiber supplement every day Experiment which form you can tolerate.   There are many forms such as powders, tablets, wafers, gummies, etc Psyllium bran (Metamucil), methylcellulose (Citrucel), Miralax or Glycolax, Benefiber, Flax Seed.  Adjust the dose week-by-week (1/2 dose/day to 6 doses a day) until you are moving your bowels 1-2 times a day.  Cut back the dose or try a different fiber product if it is giving you problems such as diarrhea or bloating. Sometimes a laxative is needed to help jump-start bowels if constipated until the fiber supplement can help regulate your bowels.  If you are tolerating eating & you are farting, it is okay to try a gentle laxative such as double dose MiraLax, prune juice, or Milk of Magnesia.  Avoid using laxatives too often. Stool softeners can sometimes help counteract the constipating effects of narcotic pain medicines.  It can also cause diarrhea, so avoid using for too  long. If you are still constipated despite taking fiber daily, eating solids, and a few doses of laxatives, call our office. Controlling diarrhea Try drinking liquids and  eating bland foods for a few days to avoid stressing your intestines further. Avoid dairy products (especially milk & ice cream) for a short time.  The intestines often can lose the ability to digest lactose when stressed. Avoid foods that cause gassiness or bloating.  Typical foods include beans and other legumes, cabbage, broccoli, and dairy foods.  Avoid greasy, spicy, fast foods.  Every person has some sensitivity to other foods, so listen to your body and avoid those foods that trigger problems for you. Probiotics (such as active yogurt, Align, etc) may help repopulate the intestines and colon with normal bacteria and calm down a sensitive digestive tract Adding a fiber supplement gradually can help thicken stools by absorbing excess fluid and retrain the intestines to act more normally.  Slowly increase the dose over a few weeks.  Too much fiber too soon can backfire and cause cramping & bloating. It is okay to try and slow down diarrhea with a few doses of antidiarrheal medicines.   Bismuth subsalicylate (ex. Kayopectate, Pepto Bismol) for a few doses can help control diarrhea.  Avoid if pregnant.   Loperamide (Imodium) can slow down diarrhea.  Start with one tablet (2mg ) first.  Avoid if you are having fevers or severe pain.  ILEOSTOMY PATIENTS WILL HAVE CHRONIC DIARRHEA since their colon is not in use.    Drink plenty of liquids.  You will need to drink even more glasses of water/liquid a day to avoid getting dehydrated. Record output from your ileostomy.  Expect to empty the bag every 3-4 hours at first.  Most people with a permanent ileostomy empty their bag 4-6 times at the least.   Use antidiarrheal medicine (especially Imodium) several times a day to avoid getting dehydrated.  Start with a dose at bedtime & breakfast.   Adjust up or down as needed.  Increase antidiarrheal medications as directed to avoid emptying the bag more than 8 times a day (every 3 hours). Work with your wound ostomy nurse to learn care for your ostomy.  See ostomy care instructions. TROUBLESHOOTING IRREGULAR BOWELS 1) Start with a soft & bland diet. No spicy, greasy, or fried foods.  2) Avoid gluten/wheat or dairy products from diet to see if symptoms improve. 3) Miralax 17gm or flax seed mixed in [place]. water or juice-daily. May use 2-4 times a day as needed. 4) Gas-X, Phazyme, etc. as needed for gas & bloating.  5) Prilosec (omeprazole) over-the-counter as needed 6)  Consider probiotics (Align, Activa, etc) to help calm the bowels down  Call your doctor if you are getting worse or not getting better.  Sometimes further testing (cultures, endoscopy, X-ray studies, CT scans, bloodwork, etc.) may be needed to help diagnose and treat the cause of the diarrhea. [facility], [place], [place], [place], [place]  [address] [phone] - Main.    [phone]  - Toll Free.   [phone] - Fax [email or web address]

## 2020-07-23 NOTE — Progress Notes (Signed)
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 PMH includes bipolar disorder, CAD, CKD, psoriatic arthritis, NSTEMI, B THA. Recent admission 10/11-10/21/21 for colovesical fistula and frequent falls.   OT comments  OT treatment session with focus on self-care re-education, activity tolerance, and energy conservation strategies. Patient making good progress toward goals demonstrating multiple sit to stand transfers with Min guard. Patient complete toieting/hygiene/clothing management with Min guard. Toilet transfer and toileting goals updated. Patient continues to require supplemental O2 with activity. Patient would benefit from continued acute OT services to maximize safety and independence with self-care tasks in prep for safe d/c to next level of care with continued recommendation for SNF rehab.     Follow Up Recommendations  SNF;Supervision/Assistance - 24 hour    Equipment Recommendations  None recommended by OT    Recommendations for Other Services      Precautions / Restrictions Precautions Precautions: Fall Precaution Comments: JP drain on R, on O2 but amb w/o Restrictions Weight Bearing Restrictions: No       Mobility Bed Mobility Overal bed mobility: Needs Assistance Bed Mobility: Supine to Sit Rolling: Supervision         General bed mobility comments: HOB elevated and slightly increased time/effort.   Transfers   Equipment used: Rolling walker (2 wheeled) Transfers: Sit to/from Stand Sit to Stand: Min guard         General transfer comment: Sit to stand x4 from EOB>BSC over standard toilet, and recliner with Min guard.     Balance   Sitting-balance support: Feet supported;Bilateral upper extremity supported Sitting balance-Leahy Scale:  Fair     Standing balance support: Bilateral upper extremity supported Standing balance-Leahy Scale: Fair Standing balance comment: Reliance on BUE on RW                           ADL either performed or assessed with clinical judgement   ADL Overall ADL's : Needs assistance/impaired     Grooming: Wash/dry hands;Wash/dry face;Oral care;Brushing hair;Sitting;Standing;Min guard Grooming Details (indicate cue type and reason): Patient completed hand washing and oral hygiene in standing. Completed face washing and hair brushing seated in recliner 2/2 fatigue and knee buckling.                  Toilet Transfer: Designer, television/film set Details (indicate cue type and reason): Min guard with use of RW.  Toileting- Water quality scientist and Hygiene: Min guard;Sit to/from stand;Sitting/lateral lean Toileting - Clothing Manipulation Details (indicate cue type and reason): Hygiene/clothing management with Min guard for steadying.      Functional mobility during ADLs: Min guard;Rolling walker General ADL Comments: Min guard for safety and occasional steadying with use of RW. Episode of LE buckling with fatigue during grooming tasks standing at sink. SpO2 97% at rest on 2L but desat to 86-88% on RA during toileting task as patient declined O2. SpO2 returned to 97% on 2L within ~10-15 sec.      Vision       Perception     Praxis      Cognition Arousal/Alertness: Awake/alert Behavior During Therapy: WFL for tasks assessed/performed Overall Cognitive Status: Within Functional Limits for tasks assessed  Exercises     Shoulder Instructions       General Comments      Pertinent Vitals/ Pain       Pain Assessment: No/denies pain Pain Intervention(s): Monitored during session  Home Living                                          Prior Functioning/Environment               Frequency  Min 2X/week        Progress Toward Goals  OT Goals(current goals can now be found in the care plan section)  Progress towards OT goals: Progressing toward goals  Acute Rehab OT Goals Patient Stated Goal: go to rehab OT Goal Formulation: With patient/family Time For Goal Achievement: 08/01/20 Potential to Achieve Goals: Good ADL Goals Pt Will Perform Grooming: with supervision;sitting;standing Pt Will Perform Upper Body Dressing: with min assist;sitting Pt Will Perform Lower Body Dressing: with min assist;with adaptive equipment;sit to/from stand;sitting/lateral leans Pt Will Transfer to Toilet: with modified independence;ambulating Pt Will Perform Toileting - Clothing Manipulation and hygiene: with modified independence;sit to/from stand;sitting/lateral leans  Plan Discharge plan remains appropriate    Co-evaluation                 AM-PAC OT "6 Clicks" Daily Activity     Outcome Measure   Help from another person eating meals?: A Little Help from another person taking care of personal grooming?: A Little Help from another person toileting, which includes using toliet, bedpan, or urinal?: A Little Help from another person bathing (including washing, rinsing, drying)?: A Lot Help from another person to put on and taking off regular upper body clothing?: A Lot Help from another person to put on and taking off regular lower body clothing?: A Lot 6 Click Score: 15    End of Session Equipment Utilized During Treatment: Gait belt;Rolling walker;Oxygen (2L via [place])  OT Visit Diagnosis: Unsteadiness on feet (R26.81);Other abnormalities of gait and mobility (R26.89);Muscle weakness (generalized) (M62.81);Pain   Activity Tolerance Patient tolerated treatment well   Patient Left in chair;with call bell/phone within reach;with chair alarm set   Nurse Communication          Time: 1638-4536 OT Time Calculation (min): 26 min  Charges: OT General  Charges $OT Visit: 1 Visit OT Treatments $Self Care/Home Management : 23-37 mins  Sumiya Mamaril H. OTR/L Supplemental OT, Department of rehab services [phone]   Betheny Suchecki R H. 07/23/2020, 11:48 AM

## 2020-07-23 NOTE — Progress Notes (Signed)
PROGRESS NOTE    Valerie [name]  RXV:[id number] DOB: Feb 28, 1961 DOA: 07/17/2020 PCP: Otelia Santee I, MD    Brief history. 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 Postoperatively, patient was noted to be more sleepy and groggy so medicine was consulted.  She has some fluid overload.  Negative for stroke.  Improved with symptomatic treatment and de-escalation of sedatives.  Assessment plan:  Acute metabolic encephalopathy: Improved.  Negative for stroke or infection.  No focal deficits now.  Hypoxia: Multifactorial.  Obstructive sleep apnea, postop atelectasis and fluid retention.  Improved with diuresis.  Still using 2 to 3 L of oxygen.  She can be gradually weaned off from the oxygen at skilled nursing rehab.  Colovesical fistula: As per surgical plan.  Foley catheter and drain removed today.  Type 2 diabetes: Diet controlled.  Not on treatment.  Psoriatic arthritis: On maintenance prednisone 5/10 mg alternate day.  Continued.  Obstructive sleep apnea: Using CPAP.  Patient has medically stabilized.  Still using some supplemental oxygen.  Due to her obesity, obstructive sleep apnea, it may take some time for her to wean off the oxygen.  She can be discharged to a skilled nursing facility when bed is available.  Discussed with surgery.     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 Disposition Plan: Status is: Inpatient  Remains inpatient appropriate because:Inpatient level of care appropriate due to severity of illness   Dispo: The patient is from: Home              Anticipated d/c is to: SNF              Anticipated d/c date is: When bed available.              Patient is medically stable to transfer to skilled level of care when available.     Consultants:   TRH  Procedures:   As above  Antimicrobials:   None   Subjective: No overnight events.   Objective: Vitals:   07/22/20 1333 07/22/20 2116 07/23/20 0525 07/23/20 1429  BP: 113/62 (!) 119/58 121/64 (!) 150/55  Pulse: (!) 58 (!) 55 66 (!) 59  Resp: 20 17 16 17   Temp: 98 F (36.7 C) 98.2 F (36.8 C) 98.6 F (37 C) 98 F (36.7 C)  TempSrc: Oral  Oral Oral  SpO2: 93% 97% 93% 96%  Weight:  Height:        Intake/Output Summary (Last 24 hours) at 07/23/2020 1513 Last data filed at 07/23/2020 1009 Gross per 24 hour  Intake 720 ml  Output 2290 ml  Net -1570 ml   Filed Weights   07/18/20 0500 07/20/20 0500 07/22/20 0517  Weight: 102.8 kg 102 kg 94.5 kg    Examination:  General exam: Appears calm and comfortable. Not in any distress.  On 2-3 L oxygen. Respiratory system: Poor air entry at bases.  No added sounds. Cardiovascular system: S1 & S2 heard, RRR.  Gastrointestinal  system: Abdomen is distended but nontender.  Obese and pendulous. No neurological deficits.    Data Reviewed: I have personally reviewed following labs and imaging studies  CBC: Recent Labs  Lab 07/17/20 0645 07/18/20 0449 07/19/20 0543 07/20/20 0349 07/21/20 0325 07/22/20 0317 07/23/20 0344  WBC 7.4   < > 14.2* 13.4* 12.0* 12.3* 13.7*  NEUTROABS 5.3  --   --   --  9.8* 9.4* 10.3*  HGB 9.7*   < > 8.6* 8.4* 8.6* 8.9* 9.3*  HCT 31.7*   < > 27.8* 27.2* 27.8* 28.8* 30.2*  MCV 104.6*   < > 106.9* 104.2* 104.1* 103.6* 103.8*  PLT 218   < > 198 212 246 261 275   < > = values in this interval not displayed.   Basic Metabolic Panel: Recent Labs  Lab 07/18/20 0449 07/18/20 0449 07/18/20 0955 07/19/20 0543 07/19/20 0543 07/19/20 0906 07/20/20 0349 07/21/20 0325 07/22/20 0317 07/23/20 0344  NA 136  --   --  141  --  138 140 139  --   --   K 4.6   < >  --  4.4  4.2   < > 4.1 3.9 3.4* 3.5 3.7  CL 104  --   --  108  --  104 101 99  --   --   CO2 26  --   --  27  --  27 31 32  --   --   GLUCOSE 124*  --   --  89  --  97 88 82  --   --   BUN 20  --   --  22*  --  21* 19 16  --   --   CREATININE 1.70*   < >  --  1.56*  1.50*   < > 1.56* 1.33* 1.24* 1.34* 1.46*  CALCIUM 10.7*  --   --  11.6*  --  11.7* 11.4* 11.4*  --   --   MG 1.7  --   --   --   --   --  1.9 1.8  --   --   PHOS  --   --  3.1  --   --   --   --  1.9*  --   --    < > = values in this interval not displayed.   GFR: Estimated Creatinine Clearance: 43.9 mL/min (A) (by C-G formula based on SCr of 1.46 mg/dL (H)). Liver Function Tests: Recent Labs  Lab 07/17/20 0645 07/19/20 0906  AST 18 17  ALT 12 14  ALKPHOS 73 65  BILITOT 0.4 0.9  PROT 6.0* 6.1*  ALBUMIN 3.1* 2.8*   No results for input(s): LIPASE, AMYLASE in the last 168 hours. Recent Labs  Lab 07/19/20 1902  AMMONIA 28   Coagulation Profile: No results for input(s): INR, PROTIME in the last 168 hours. Cardiac Enzymes: No results for input(s):  CKTOTAL, CKMB, CKMBINDEX, TROPONINI in the last 168 hours. BNP (last 3 results) No results for input(s): PROBNP in the last 8760 hours. HbA1C: No results for input(s): HGBA1C in the last 72 hours. CBG: Recent Labs  Lab 07/20/20 0722 07/20/20 1135 07/20/20 1711 07/20/20 2113 07/21/20 0735  GLUCAP 85 116* 122* 95 95   Lipid Profile: No results for input(s): CHOL, HDL, LDLCALC, TRIG, CHOLHDL, LDLDIRECT in the last 72 hours. Thyroid Function Tests: No results for input(s): TSH, T4TOTAL, FREET4, T3FREE, THYROIDAB in the last 72 hours. Anemia Panel: No results for input(s): VITAMINB12, FOLATE, FERRITIN, TIBC, IRON, RETICCTPCT in the last 72 hours. Sepsis Labs: No results for input(s): PROCALCITON, LATICACIDVEN in the last 168 hours.  Recent Results (from the past 240 hour(s))  Resp Panel by RT-PCR (Flu A&B, Covid) Nasopharyngeal Swab     Status: None   Collection Time: 07/23/20  9:53 AM   Specimen: Nasopharyngeal Swab; Nasopharyngeal(NP) swabs in vial transport medium  Result Value Ref Range Status   SARS Coronavirus 2 by RT PCR NEGATIVE NEGATIVE Final    Comment: (NOTE) SARS-CoV-2 target nucleic acids are NOT DETECTED.  The SARS-CoV-2 RNA is generally detectable in upper respiratory specimens during the acute phase of infection. The lowest concentration of SARS-CoV-2 viral copies this assay can detect is 138 copies/mL. A negative result does not preclude SARS-Cov-2 infection and should not be used as the sole basis for treatment or other patient management decisions. A negative result may occur with  improper specimen collection/handling, submission of specimen other than nasopharyngeal swab, presence of viral mutation(s) within the areas targeted by this assay, and inadequate number of viral copies(<138 copies/mL). A negative result must be combined with clinical observations, patient history, and epidemiological information. The expected result is Negative.  Fact Sheet for  Patients:  EntrepreneurPulse.com.au  Fact Sheet for Healthcare Providers:  IncredibleEmployment.be  This test is no t yet approved or cleared by the [place] FDA and  has been authorized for detection and/or diagnosis of SARS-CoV-2 by FDA under an Emergency Use Authorization (EUA). This EUA will remain  in effect (meaning this test can be used) for the duration of the COVID-19 declaration under Section 564(b)(1) of the Act, 21 U.S.C.section 360bbb-3(b)(1), unless the authorization is terminated  or revoked sooner.       Influenza A by PCR NEGATIVE NEGATIVE Final   Influenza B by PCR NEGATIVE NEGATIVE Final    Comment: (NOTE) The Xpert Xpress SARS-CoV-2/FLU/RSV plus assay is intended as an aid in the diagnosis of influenza from Nasopharyngeal swab specimens and should not be used as a sole basis for treatment. Nasal washings and aspirates are unacceptable for Xpert Xpress SARS-CoV-2/FLU/RSV testing.  Fact Sheet for Patients: EntrepreneurPulse.com.au  Fact Sheet for Healthcare Providers: IncredibleEmployment.be  This test is not yet approved or cleared by the [place] FDA and has been authorized for detection and/or diagnosis of SARS-CoV-2 by FDA under an Emergency Use Authorization (EUA). This EUA will remain in effect (meaning this test can be used) for the duration of the COVID-19 declaration under Section 564(b)(1) of the Act, 21 U.S.C. section 360bbb-3(b)(1), unless the authorization is terminated or revoked.  Performed at [facility], [place] [address] [place], [place] [address]          Radiology Studies: DG Cystogram  Result Date: 07/23/2020 CLINICAL DATA:  History of repair of colovesical fistula. EXAM: CYSTOGRAM TECHNIQUE: 150 mL Cysto-Hypaque 30% was infused into the bladder through the existing Foley catheter by drip  infusion. Serial spot images were obtained  during bladder filling and post draining. FLUOROSCOPY TIME:  Fluoroscopy Time:  1 minutes and 40 seconds Radiation Exposure Index (if provided by the fluoroscopic device): 48.8 mGy Number of Acquired Spot Images: 3 COMPARISON:  CT scan 06/04/2020 FINDINGS: The bladder is unremarkable. No filling defects or extravasation of contrast is identified. IMPRESSION: Normal cystogram. No extravasation of contrast to suggest persistent fistula or postoperative leakage. Electronically Signed   By: Marijo Sanes M.D.   On: 07/23/2020 10:16   DG CHEST PORT 1 VIEW  Result Date: 07/22/2020 CLINICAL DATA:  Hypoxia EXAM: PORTABLE CHEST 1 VIEW COMPARISON:  07/19/2020 chest radiograph. FINDINGS: Right PICC terminates over the cavoatrial junction. Stable cardiomediastinal silhouette with top-normal heart size. No pneumothorax. No pleural effusion. Improved lung volumes. No pulmonary edema. Minimal residual atelectasis at the left lung base, improved. IMPRESSION: Improved lung volumes with minimal residual left basilar atelectasis. Electronically Signed   By: Ilona Sorrel M.D.   On: 07/22/2020 08:49        Scheduled Meds: . acetaminophen  500 mg Oral Q6H  . vitamin C  500 mg Oral QHS  . aspirin EC  81 mg Oral Daily  . B-complex with vitamin C  1 tablet Oral QHS  . Chlorhexidine Gluconate Cloth  6 each Topical Daily  . cholecalciferol  1,000 Units Oral Daily  . enoxaparin (LOVENOX) injection  40 mg Subcutaneous Q24H  . escitalopram  30 mg Oral Daily  . feeding supplement (GLUCERNA SHAKE)  237 mL Oral BID BM  . furosemide  40 mg Oral BID  . lamoTRIgine  150 mg Oral BID  . lip balm  1 application Topical BID  . magnesium oxide  400 mg Oral Daily  . nadolol  10 mg Oral Daily  . nystatin   Topical BID  . pantoprazole  40 mg Oral Daily  . predniSONE  10 mg Oral Q breakfast  . psyllium  1 packet Oral BID  . rosuvastatin  5 mg Oral Daily  . sodium chloride flush  10-40 mL Intracatheter Q12H  . traZODone  200 mg  Oral QHS   Continuous Infusions: . methocarbamol (ROBAXIN) IV       LOS: 6 days    Time spent: 20 minutes    Barb Merino, MD [facility] Pager [phone]

## 2020-07-23 NOTE — Progress Notes (Signed)
[facility] arrived to transport patient to [facility].

## 2020-07-23 NOTE — Progress Notes (Signed)
Valerie [name] [id number] [birth year]  CARE TEAM:  PCP: Ophelia Shoulder, MD  Outpatient Care Team: Patient Care Team: Ophelia Shoulder, MD as PCP - General (Internal Medicine) [name], MD as PCP - Cardiology (Cardiology) Janith Lima, MD as Consulting Physician (Urology) Michael Boston, MD as Consulting Physician (Colon and Rectal Surgery) Carol Ada, MD as Consulting Physician (Gastroenterology) [name], MD as Consulting Physician (Cardiology)  Inpatient Treatment Team: Treatment Team: Attending Provider: Barb Merino, MD; Technician: Ernest Mallick, NT; Consulting Physician: Harold Hedge, MD; Consulting Physician: Mariel Aloe, MD; Rounding Team: Suzan Garibaldi, MD; Consulting Physician: Michael Boston, MD; Consulting Physician: Nolon Nations, MD; Registered Nurse: Heloise Ochoa, RN; Occupational Therapist: Felipe Drone, OT; Registered Nurse: Aura Dials, RN   Problem List:   Principal Problem:   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 Active Problems:   Psoriatic arthritis ([place])   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   Anxiety   Obesity, Class III, BMI 40-49.9 (morbid obesity) (HCC)   CKD (chronic kidney disease), stage III (HCC)   OSA (obstructive sleep apnea)   Bipolar disorder ([place])   DM type 2 (diabetes mellitus, type 2) (HCC)   CAD (coronary artery disease)   Protein-calorie malnutrition, moderate (HCC)   Anemia, chronic disease   Altered mental status   Acute respiratory failure with hypoxemia ([place])   Hypoxemia   6 Days Post-Op    07/17/2020  POST-OPERATIVE DIAGNOSIS:  COLOVESICAL FISTULA DUE TO DIVERTICULITIS  PROCEDURE:   ROBOTIC LOW ANTERIOR RECTOSIGMOID RESECTION PRIMARY COLOVESICAL FISTULA REPAIR MOBILIZATION OF SPLENIC FLEXURE OMENTAL PEDICLE FLAP WITH OMENTOPEXY OF BLADDER REPAIR ASSESSMENT OF TISSUE PERFUSION WITH FIREFLY IMMUNOFLUORESENCE RIGID  PROCTOSCOPY  SURGEON:  [name], MD  07/17/2020 Preoperative diagnosis:  1. Diverticulitis with colovesical fistula  Postoperative diagnosis:  1. Diverticulitis with colovesical fistula  Procedure: 1. Cystoscopy 2. Bilateral ureteral cannulation and injection of indocyanine green dye  Surgeon: Pryor Curia. M.D.  SURGICAL PATHOLOGY   CASE: [id number]  [name]   FINAL MICROSCOPIC DIAGNOSIS:   A. COLON, RECTOSIGMOID, RESECTION:  - Diverticulitis with perforation and pericolonic abscess, clinically  colovesical fistula.  - Two benign lymph nodes.  - No evidence of malignancy.   B. FINAL DISTAL MARGIN:  - Unremarkable colon.  - No evidence of malignancy.   Assessment  SLOWLY IMPROVING  [facility] Stay = 6 days)  Plan:  Pathology consistent with diverticulitis.  Reassuring.  [facility] internal medicine help appreciated  Heart healthy solid diet as tolerated  Mental status more WNL to me.  Defer to medicine and neurology - CT & MRI underwhelming a hopeful sign.  Pain control adequate.  Holding gabapentin given grogginess.  Low-dose Tylenol seems to be adequate.  Patient used to hydrocodone to use for p.o.  Hypoxia most likely related to pulmonary issues despite try to keep on the dry side.  Lasix responsive so will place on scheduled Lasix for 3 more doses and follow creatinine Able to go down from 5 L to 3 L.  Hopefully can wean further versus need for home health oxygen.  Could benefit from chest x-ray but will defer to medicine.   Hypokalemia most likely from diuresis.  Replace orally and follow  Follow-up on pathology  Foley catheter status post repair of colovesical fistula.  Send fluid for drain creatinine today.  Do cystogram today as well.  If no evidence of leak remove Foley.  If concern for persistent  leak, leave for 2 more weeks and restudy.  See if this can be done prior to discharge.    Acute on chronic anemia.  IV  iron given 11/25.  Check anemia panel since she seems to be macrocytic to rule out any folate and vitamin B12 or other deficiencies  Plan remove PICC line at d/c  Keep on low-dose steroids for now to minimize anastomotic healing issues while the same time preventing debilitating psoriatic arthritis.  Discussed with internal medicine.  Try to avoid bolusing high-dose steroids in order to minimize anastomotic leak risks unless would be of benefit from a pulmonary standpoint.  Cortisol somewhat low.  Can place on 10 mg daily for now (up from 5/10 alternating).  Severe psoriatic arthritis.  Usually on Enbrel.  Can consider restarting that around 3 weeks out from surgery on side outside window of possible leak or abscess.  Hopefully could wean off steroids as well  Nystatin & fluconazole for prob yeast rash under panniculus, etc  Pain control.  Hypercholesterolemia controlled  GERD control  CPAP for sleep apnea  Borderline diabetes.  SSI & follow  VTE prophylaxis- SCDs, etc  -mobilize as tolerated to help recovery.  PT/OT seeing.  She is already rather deconditioned.  Hopefully can work with therapy when she is more alert.    Patient would benefit from skilled nursing facility for more aggressive care and daily rehab.  We will get transitional care team involved given her medical complexity and deconditioned state.  Medicine had taken over as lead but now seems to be wishing me to be primary again.  Patient has not weaned off oxygen = will need oxygen supplementation per TRH.  Otherwise, should be able to discharge later today 11/30, with or without Foley depending on cystogram findings  40 minutes spent in review, evaluation, examination, counseling, and coordination of care.  More than 50% of that time was spent in counseling.  07/23/2020    Subjective: (Chief complaint)  Pt walked with physical therapy  Tol supp shakes RN in room    Objective:  Vital signs:  Vitals:    07/22/20 0517 07/22/20 1333 07/22/20 2116 07/23/20 0525  BP: 121/63 113/62 (!) 119/58 121/64  Pulse: 66 (!) 58 (!) 55 66  Resp: 18 20 17 16   Temp: 99.1 F (37.3 C) 98 F (36.7 C) 98.2 F (36.8 C) 98.6 F (37 C)  TempSrc: Oral Oral  Oral  SpO2: 92% 93% 97% 93%  Weight: 94.5 kg     Height:        Last BM Date: 07/21/20  Intake/Output   Yesterday:  11/29 0701 - 11/30 0700 In: 1200 [P.O.:1200] Out: 2840 [Urine:2800; Drains:40] This shift:  No intake/output data recorded.  Bowel function:  Flatus: YES  BM:  YES  Drain: Serosanguinous   Physical Exam:  General: Pt awake/alert in no acute distress.  Alert Eyes: PERRL, normal EOM.  Sclera clear.  No icterus Neuro: CN II-XII intact w/o focal sensory/motor deficits. Lymph: No head/neck/groin lymphadenopathy Psych:  No delerium/psychosis/paranoia.  Oriented x 4 HENT: Normocephalic, Mucus membranes moist.  No thrush Neck: Supple, No tracheal deviation.  No obvious thyromegaly Chest: No pain to chest wall compression.  Good respiratory excursion.  No audible wheezing CV:  Pulses intact.  Regular rhythm.  No major extremity edema MS: Normal AROM mjr joints.  No obvious deformity  Abdomen: Soft.  Nondistended.  Nontender.  Incisions - clean dry and intact no evidence of peritonitis.  No incarcerated hernias.  GU: Pfannenstiel incision w scant moisture but no cellulitis/seroma/ecchymosis.  Foley in place with clear    yellow urine.  Ext:  No deformity.  No mjr edema.  No cyanosis Skin: No petechiae / purpurea.  No major sores.  Warm and dry    Results:   SURGICAL PATHOLOGY   CASE: [id number]  [name]  Surgical Pathology Report   Clinical History: Colo vesical fistula ([facility])   FINAL MICROSCOPIC DIAGNOSIS:   A. COLON, RECTOSIGMOID, RESECTION:  - Diverticulitis with perforation and pericolonic abscess, clinically  colovesical fistula.  - Two benign lymph nodes.  - No evidence of malignancy.   B.  FINAL DISTAL MARGIN:  - Unremarkable colon.  - No evidence of malignancy.   Valerie [name] DESCRIPTION:  Specimen A: Rectosigmoid colon, open end is proximal, received fresh.  Length: 29 cm, tortuous.  Serosa: Pink-red to dark red, diffusely roughened. Within the mid  segment there is a 0.4 cm transmural defect.  Contents: Small amount of light green-brown soft material.  Mucosa/Wall: The mucosa is tan-pink to hyperemic, smooth, soft, with  normal intestinal folds. There are no mass lesions. The wall is up to  2.3 cm thick, and has multiple diverticula throughout the segment, one  of which is involved with the transmural defect.  Lymph nodes: 5 possible lymph nodes are sampled from the fat.  Block Summary:  Block 1 = proximal margin  Block 2 = distal margin  Blocks 3, 4 = transmural defect associated with diverticulum  Block 5 = nonperforated diverticulum  Block 6 = 5 possible nodes   Specimen B: Received fresh. Is a 1.6 cm in length and 2.1 cm in  diameter ring of tissue with overlying tan-pink smooth mucosa, and  underlying muscularis with embedded staples. Representative sections  are submitted in 1 block.  SW 07/19/2020    Final Diagnosis performed by Claudette Laws, MD.  Electronically signed  07/22/2020  Technical and / or Professional components performed at [facility], [place] [address], [place], [place] [address].  Immunohistochemistry Technical component (if applicable) was performed  at [facility]. [address] [place],  [place], [place] [address].  IMMUNOHISTOCHEMISTRY DISCLAIMER (if applicable):  Some of these immunohistochemical stains Zena Amos been developed and the  performance characteristics determine by [facility]. Some  may not have been cleared or approved by the U.S. Food and Drug  Administration. The FDA has determined that such clearance or approval  is not necessary. This test is used for clinical  purposes. It should not  be regarded as investigational or for research. This laboratory is  certified under the [place]  (CLIA-88) as qualified to perform high complexity clinical laboratory  testing. The controls stained appropriately.   Cultures: Recent Results (from the past 720 hour(s))  SARS CORONAVIRUS 2 (TAT 6-24 HRS) Nasopharyngeal Nasopharyngeal Swab     Status: None   Collection Time: 07/13/20 10:40 AM   Specimen: Nasopharyngeal Swab  Result Value Ref Range Status   SARS Coronavirus 2 NEGATIVE NEGATIVE Final    Comment: (NOTE) SARS-CoV-2 target nucleic acids are NOT DETECTED.  The SARS-CoV-2 RNA is generally detectable in upper and lower respiratory specimens during the acute phase of infection. Negative results do not preclude SARS-CoV-2 infection, do not rule out co-infections with other pathogens, and should not be used as the sole basis for treatment or other patient management decisions. Negative results must be combined with clinical observations, patient history, and  epidemiological information. The expected result is Negative.  Fact Sheet for Patients: SugarRoll.be  Fact Sheet for Healthcare Providers: https://www.woods-mathews.com/  This test is not yet approved or cleared by the [place] FDA and  has been authorized for detection and/or diagnosis of SARS-CoV-2 by FDA under an Emergency Use Authorization (EUA). This EUA will remain  in effect (meaning this test can be used) for the duration of the COVID-19 declaration under Se ction 564(b)(1) of the Act, 21 U.S.C. section 360bbb-3(b)(1), unless the authorization is terminated or revoked sooner.  Performed at [facility], [place] [address] [place], [place] [address]     Labs: Results for orders placed or performed during the hospital encounter of 07/17/20 (from the past 48 hour(s))  Glucose, capillary      Status: None   Collection Time: 07/21/20  7:35 AM  Result Value Ref Range   Glucose-Capillary 95 70 - 99 mg/dL    Comment: Glucose reference range applies only to samples taken after fasting for at least 8 hours.  Prealbumin     Status: Abnormal   Collection Time: 07/21/20 11:53 PM  Result Value Ref Range   Prealbumin 12.0 (L) 18 - 38 mg/dL    Comment: Performed at [facility], [place] [address] [place], [place] [address]  CBC with Differential/Platelet     Status: Abnormal   Collection Time: 07/22/20  3:17 AM  Result Value Ref Range   WBC 12.3 (H) 4.0 - 10.5 K/uL   RBC 2.78 (L) 3.87 - 5.11 MIL/uL   Hemoglobin 8.9 (L) 12.0 - 15.0 g/dL   HCT 28.8 (L) 36 - 46 %   MCV 103.6 (H) 80.0 - 100.0 fL   MCH 32.0 26.0 - 34.0 pg   MCHC 30.9 30.0 - 36.0 g/dL   RDW 13.6 11.5 - 15.5 %   Platelets 261 150 - 400 K/uL   nRBC 0.0 0.0 - 0.2 %   Neutrophils Relative % 77 %   Neutro Abs 9.4 (H) 1.7 - 7.7 K/uL   Lymphocytes Relative 9 %   Lymphs Abs 1.1 0.7 - 4.0 K/uL   Monocytes Relative 9 %   Monocytes Absolute 1.1 (H) 0.1 - 1.0 K/uL   Eosinophils Relative 3 %   Eosinophils Absolute 0.3 0.0 - 0.5 K/uL   Basophils Relative 0 %   Basophils Absolute 0.0 0.0 - 0.1 K/uL   Immature Granulocytes 2 %   Abs Immature Granulocytes 0.30 (H) 0.00 - 0.07 K/uL    Comment: Performed at [facility], [place] [address], [place], [place] [address]  Potassium     Status: None   Collection Time: 07/22/20  3:17 AM  Result Value Ref Range   Potassium 3.5 3.5 - 5.1 mmol/L    Comment: Performed at [facility], [place] [address], [place], [place] [address]  Creatinine, serum     Status: Abnormal   Collection Time: 07/22/20  3:17 AM  Result Value Ref Range   Creatinine, Ser 1.34 (H) 0.44 - 1.00 mg/dL   GFR, Estimated 45 (L) >60 mL/min    Comment: (NOTE) Calculated using the CKD-EPI Creatinine Equation (2021) Performed at [facility], [place]  [address] [place], [place] [address]   CBC with Differential/Platelet     Status: Abnormal   Collection Time: 07/23/20  3:44 AM  Result Value Ref Range   WBC 13.7 (H) 4.0 - 10.5 K/uL   RBC 2.91 (L) 3.87 - 5.11 MIL/uL  Hemoglobin 9.3 (L) 12.0 - 15.0 g/dL   HCT 30.2 (L) 36 - 46 %   MCV 103.8 (H) 80.0 - 100.0 fL   MCH 32.0 26.0 - 34.0 pg   MCHC 30.8 30.0 - 36.0 g/dL   RDW 13.8 11.5 - 15.5 %   Platelets 275 150 - 400 K/uL   nRBC 0.0 0.0 - 0.2 %   Neutrophils Relative % 75 %   Neutro Abs 10.3 (H) 1.7 - 7.7 K/uL   Lymphocytes Relative 9 %   Lymphs Abs 1.3 0.7 - 4.0 K/uL   Monocytes Relative 9 %   Monocytes Absolute 1.2 (H) 0.1 - 1.0 K/uL   Eosinophils Relative 4 %   Eosinophils Absolute 0.5 0.0 - 0.5 K/uL   Basophils Relative 0 %   Basophils Absolute 0.1 0.0 - 0.1 K/uL   Immature Granulocytes 3 %   Abs Immature Granulocytes 0.38 (H) 0.00 - 0.07 K/uL    Comment: Performed at [facility], [place] [address], [place], [place] [address]  Potassium     Status: None   Collection Time: 07/23/20  3:44 AM  Result Value Ref Range   Potassium 3.7 3.5 - 5.1 mmol/L    Comment: Performed at [facility], [place] [address], [place], [place] [address]  Creatinine, serum     Status: Abnormal   Collection Time: 07/23/20  3:44 AM  Result Value Ref Range   Creatinine, Ser 1.46 (H) 0.44 - 1.00 mg/dL   GFR, Estimated 41 (L) >60 mL/min    Comment: (NOTE) Calculated using the CKD-EPI Creatinine Equation (2021) Performed at [facility], [place] [address] [place], [place] [address]     Imaging / Studies: DG CHEST PORT 1 VIEW  Result Date: 07/22/2020 CLINICAL DATA:  Hypoxia EXAM: PORTABLE CHEST 1 VIEW COMPARISON:  07/19/2020 chest radiograph. FINDINGS: Right PICC terminates over the cavoatrial junction. Stable cardiomediastinal silhouette with top-normal heart size. No pneumothorax. No pleural effusion. Improved lung volumes. No pulmonary edema.  Minimal residual atelectasis at the left lung base, improved. IMPRESSION: Improved lung volumes with minimal residual left basilar atelectasis. Electronically Signed   By: Ilona Sorrel M.D.   On: 07/22/2020 08:49    Medications / Allergies: per chart  Antibiotics: Anti-infectives (From admission, onward)   Start     Dose/Rate Route Frequency Ordered Stop   07/21/20 1630  fluconazole (DIFLUCAN) tablet 200 mg        200 mg Oral  Once 07/21/20 1535 07/21/20 1601   07/17/20 2000  cefoTEtan (CEFOTAN) 2 g in sodium chloride 0.9 % 100 mL IVPB        2 g 200 mL/hr over 30 Minutes Intravenous Every 12 hours 07/17/20 1615 07/17/20 2359   07/17/20 1400  neomycin (MYCIFRADIN) tablet 1,000 mg  Status:  Discontinued       "And" Linked Group Details   1,000 mg Oral 3 times per day 07/17/20 0603 07/17/20 0609   07/17/20 1400  metroNIDAZOLE (FLAGYL) tablet 1,000 mg  Status:  Discontinued       "And" Linked Group Details   1,000 mg Oral 3 times per day 07/17/20 0603 07/17/20 0609   07/17/20 0615  cefoTEtan (CEFOTAN) 2 g in sodium chloride 0.9 % 100 mL IVPB        2 g 200 mL/hr over 30 Minutes Intravenous On call to O.R. 07/17/20 0603 07/17/20 3893        Note: Portions of this report may have been transcribed using  voice recognition software. Every effort was made to ensure accuracy; however, inadvertent computerized transcription errors may be present.   Any transcriptional errors that result from this process are unintentional.    [name], MD, FACS, MASCRS Gastrointestinal and Minimally Invasive Surgery  [facility] Surgery [address], [place], [place] [address] [phone] Fax [phone] Main/Paging  CONTACT INFORMATION: Weekday (9AM-5PM) concerns: Call [facility] main office at [phone] Weeknight (5PM-9AM) or Weekend/Holiday concerns: Check [email or web address] for General Surgery [facility] coverage (Please, do not use SecureChat as it is not reliable communication  to operating surgeons for immediate patient care)      07/23/2020  7:03 AM

## 2020-07-23 NOTE — Progress Notes (Signed)
Physical Therapy Treatment Patient Details Name: Valerie [name] MRN: [id number] DOB: [birth year] Today's Date: 07/23/2020    History of Present Illness 59 year old female who presents with colovesical fistula, s/p rectosigmoid resection and fistula repair 07/17/20. PMH includes bipolar disorder, CAD, CKD, psoriatic arthritis, NSTEMI, B THA. Recent admission 10/11-10/21/21 for colovesical fistula and frequent falls.    PT Comments    Pt in bed on 2 lts nasal at rest 95% with HR 81.  Assisted OOB to amb.  General bed mobility comments: HOB elevated and slightly increased time/effort. General transfer comment: one VC on safety with turns and hand placement with stand to sit.  Quick sit due to fatigue/dyspnea.  General Gait Details: sats decreased to 89% remaining on 2 lts.  used Rollator this session as this is the AD she uses at home.  Increased gait instability and effort needed.  Required one extended seated rest break and amb a limited distance of 22 feet x 2. Pt will need ST Rehab to address mobility decline prior to returning home.   Follow Up Recommendations  SNF     Equipment Recommendations  None recommended by PT    Recommendations for Other Services       Precautions / Restrictions Precautions Precautions: Fall Precaution Comments: R JP drain    Mobility  Bed Mobility Overal bed mobility: Needs Assistance Bed Mobility: Sidelying to Sit   Sidelying to sit: Min guard;Supervision       General bed mobility comments: HOB elevated and slightly increased time/effort.   Transfers Overall transfer level: Needs assistance Equipment used: 4-wheeled walker Transfers: Sit to/from [facility] Sit to Stand: Min assist Stand pivot transfers: Min assist;Mod assist       General transfer comment: one VC on safety with turns and hand placement with stand to sit.  Quick sit due to fatigue/dyspnea  Ambulation/Gait Ambulation/Gait assistance: Min assist Gait  Distance (Feet): 44 Feet (22 feet xc 2) Assistive device: Rolling walker (2 wheeled) Gait Pattern/deviations: Step-through pattern;Decreased stride length     General Gait Details: used Rollator this session as this is the AD she uses at home.  Increased gait instability and effort needed.  Required one extended seated rest break and amb a limited distance of 22 feet x 2.   Stairs             Wheelchair Mobility    Modified Rankin (Stroke Patients Only)       Balance                                            Cognition Arousal/Alertness: Awake/alert Behavior During Therapy: WFL for tasks assessed/performed Overall Cognitive Status: Within Functional Limits for tasks assessed                                 General Comments: AxO x 3 pleasant, aware      Exercises      General Comments        Pertinent Vitals/Pain Pain Assessment: Faces Faces Pain Scale: Hurts a little bit Pain Location: bladder spasm Pain Descriptors / Indicators: Cramping Pain Intervention(s): Monitored during session    Home Living                      Prior Function  PT Goals (current goals can now be found in the care plan section) Progress towards PT goals: Progressing toward goals    Frequency    Min 2X/week      PT Plan Current plan remains appropriate    Co-evaluation              AM-PAC PT "6 Clicks" Mobility   Outcome Measure  Help needed turning from your back to your side while in a flat bed without using bedrails?: None Help needed moving from lying on your back to sitting on the side of a flat bed without using bedrails?: A Little Help needed moving to and from a bed to a chair (including a wheelchair)?: A Little Help needed standing up from a chair using your arms (e.g., wheelchair or bedside chair)?: A Little Help needed to walk in hospital room?: A Lot Help needed climbing 3-5 steps with a railing? :  A Lot 6 Click Score: 17    End of Session Equipment Utilized During Treatment: Gait belt Activity Tolerance: Patient limited by fatigue;Treatment limited secondary to medical complications (Comment);Other (comment) (dyspnea) Patient left: in bed;with call bell/phone within reach;with bed alarm set Nurse Communication: Mobility status PT Visit Diagnosis: Unsteadiness on feet (R26.81)     Time: 1314-3888   Charges:    2 ta                    Rica Koyanagi  PTA Acute  Rehabilitation Services Pager      [phone] Office      [phone]

## 2020-07-23 NOTE — Progress Notes (Signed)
Cystogram shows no leak of bladder.  Drain creatinine equivalent to serum.  Therefore no evidence of any delayed bladder leak at this time.  Remove Foley catheter.  Remove Blake drain.  Discussed with Dr. Sloan Leiter with [facility] hospitalist.  He feels patient is stable for discharge as well.  He feels patient would benefit from oxygen at discharge.  Perhaps can follow-up with primary care physician and wean off over time.  Patient with adequate oral intake and not needing supplemental parenteral nutrition.  Therefore remove PICC catheter.  Okay to discharge once location established.  Believe the daughter and patient have agreed to location.  Will place discharge orders and summary.  [name], MD, FACS, MASCRS Gastrointestinal and Minimally Invasive Surgery  [facility] [address], [place], [place] [address] [phone] Fax [phone] Main/Paging  CONTACT INFORMATION: Weekday (9AM-5PM) concerns: Call [facility] main office at [phone] Weeknight (5PM-9AM) or Weekend/Holiday concerns: Check [email or web address] for General Surgery [facility] coverage (Please, do not use SecureChat as it is not reliable communication to operating surgeons for immediate patient care)

## 2020-08-03 ENCOUNTER — Telehealth: Payer: Self-pay | Admitting: Physician Assistant

## 2020-08-03 NOTE — Telephone Encounter (Signed)
Called to discuss the homebound Covid-19 vaccination initiative with the patient and/or caregiver.   Message left to call back and sent mychart message.  Angelena Form PA-C  [facility]

## 2020-08-05 ENCOUNTER — Telehealth: Payer: Self-pay | Admitting: Physician Assistant

## 2020-08-05 NOTE — Telephone Encounter (Signed)
I connected by phone with [name] and/or patient's caregiver on 08/05/2020 at 4:52 PM to discuss the potential vaccination through our Homebound vaccination initiative.   Prevaccination Checklist for COVID-19 Vaccines  1.  Are you feeling sick today? no  2.  Have you ever received a dose of a COVID-19 vaccine?  yes      If yes, which one? Moderna   How many dose of Covid-19 vaccine have your received and dates ? One on 11/2   Check all that apply: I live in a long-term care setting. no  I have been diagnosed with a medical condition(s). Please list: recent surgery with rehab stay (pertinent to homebound status)  I am a first responder. no  I work in a long-term care facility, correctional facility, hospital, restaurant, retail setting, school, or other setting with high exposure to the public. no  4. Do you have a health condition or are you undergoing treatment that makes you moderately or severely immunocompromised? (This would include treatment for cancer or HIV, receipt of organ transplant, immunosuppressive therapy or high-dose corticosteroids, CAR-T-cell therapy, hematopoietic cell transplant [HCT], DiGeorge syndrome or Wiskott-Aldrich syndrome)  no (does take prednisone but lower dose  5. Have you received hematopoietic cell transplant (HCT) or CAR-T-cell therapies since receiving COVID-19 vaccine? no  6.  Have you ever had an allergic reaction: (This would include a severe reaction [ e.g., anaphylaxis] that required treatment with epinephrine or EpiPen or that caused you to go to the hospital.  It would also include an allergic reaction that occurred within 4 hours that caused hives, swelling, or respiratory distress, including wheezing.) A.  A previous dose of COVID-19 vaccine. no  B.  A vaccine or injectable therapy that contains multiple components, one of which is a COVID-19 vaccine component, but it is not known which component elicited the immediate reaction. no  C.  Are  you allergic to polyethylene glycol? no  D. Are you allergic to Polysorbate, which is found in some vaccines, film coated tablets and intravenous steroids?  no   7.  Have you ever had an allergic reaction to another vaccine (other than COVID-19 vaccine) or an injectable medication? (This would include a severe reaction [ e.g., anaphylaxis] that required treatment with epinephrine or EpiPen or that caused you to go to the hospital.  It would also include an allergic reaction that occurred within 4 hours that caused hives, swelling, or respiratory distress, including wheezing.)  no   8.  Have you ever had a severe allergic reaction (e.g., anaphylaxis) to something other than a component of the COVID-19 vaccine, or any vaccine or injectable medication?  This would include food, pet, venom, environmental, or oral medication allergies.  no   Check all that apply to you:  Am a female between ages 86 and 47 years old  no  Women 58 through 61 years of age can receive any FDA-authorized or -approved COVID-19 vaccine. However, they should be informed of the rare but increased risk of thrombosis with thrombocytopenia syndrome (TTS) after receipt of the [employer] Vaccine and the availability of other FDA-authorized and -approved COVID-19 vaccines. People who had TTS after a first dose of Janssen vaccine should not receive a subsequent dose of Janssen product    Am a female between ages 65 and 48 years old  no Males 5 through 60 years of age may receive the correct formulation of Pfizer-BioNTech COVID-19 vaccine. Males 18 and older can receive any FDA-authorized or -approved vaccine.  However, people receiving an mRNA COVID-19 vaccine, especially males 33 through 59 years of age and their parents/legal representative (when relevant), should be informed of the risk of developing myocarditis (an inflammation of the heart muscle) or pericarditis (inflammation of the lining around the heart) after receipt of an mRNA  vaccine. The risk of developing either myocarditis or pericarditis after vaccination is low, and lower than the risk of myocarditis associated with SARS-CoV-2 infection in adolescents and adults. Vaccine recipients should be counseled about the need to seek care if symptoms of myocarditis or pericarditis develop after vaccination     Have a history of myocarditis or pericarditis  no Myocarditis or pericarditis after receipt of the first dose of an mRNA COVID-19 vaccine series but before administration of the second dose  Experts advise that people who develop myocarditis or pericarditis after a dose of an mRNA COVID-19 vaccine not receive a subsequent dose of any COVID-19 vaccine, until additional safety data are available.  Administration of a subsequent dose of COVID-19 vaccine before safety data are available can be considered in certain circumstances after the episode of myocarditis or pericarditis has completely resolved. Until additional data are available, some experts recommend a Alphonsa Overall COVID-19 vaccine be considered instead of an mRNA COVID-19 vaccine. Decisions about proceeding with a subsequent dose should include a conversation between the patient, their parent/legal representative (when relevant), and their clinical team, which may include a cardiologist.    Have been treated with monoclonal antibodies or convalescent serum to prevent or treat COVID-19  no Vaccination should be offered to people regardless of history of prior symptomatic or asymptomatic SARS-CoV-2 infection. There is no recommended minimal interval between infection and vaccination.  However, vaccination should be deferred if a patient received monoclonal antibodies or convalescent serum as treatment for COVID-19 or for post-exposure prophylaxis. This is a precautionary measure until additional information becomes available, to avoid interference of the antibody treatment with vaccine-induced immune responses.  Defer  COVID-19 vaccination for 30 days when a passive antibody product was used for post-exposure prophylaxis.  Defer COVID-19 vaccination for 90 days when a passive antibody product was used to treat COVID-19.     Diagnosed with Multisystem Inflammatory Syndrome (MIS-C or MIS-A) after a COVID-19 infection  no It is unknown if people with a history of MIS-C or MIS-A are at risk for a dysregulated immune response to COVID-19 vaccination.  People with a history of MIS-C or MIS-A may choose to be vaccinated. Considerations for vaccination may include:   Clinical recovery from MIS-C or MIS-A, including return to normal cardiac function   Personal risk of severe acute COVID-19 (e.g., age, underlying conditions)   High or substantial community transmission of SARS-CoV-2 and personal increased risk of reinfection.   Timing of any immunomodulatory therapies (general best practice guidelines for immunization can be consulted for more information [email or web address])   It has been 90 days or more since their diagnosis of MIS-C   Onset of MIS-C occurred before any COVID-19 vaccination   A conversation between the patient, their guardian(s), and their clinical team or a specialist may assist with COVID-19 vaccination decisions. Healthcare providers and health departments may also request a consultation from the [place] at [email or web address].     Have a bleeding disorder  no Take a blood thinner  yes but holding plavix currently s/p surgery  As with all vaccines, any COVID-19 vaccine product may be given to these patients, if a physician familiar with the patient's  bleeding risk determines that the vaccine can be administered intramuscularly with reasonable safety.  ACIP recommends the following technique for intramuscular vaccination in patients with bleeding disorders or taking  blood thinners: a fine-gauge needle (23-gauge or smaller caliber) should be used for the vaccination, followed by firm pressure on the site, without rubbing, for at least 2 minutes.  People who regularly take aspirin or anticoagulants as part of their routine medications do not need to stop these medications prior to receipt of any COVID-19 vaccine.    Have a history of heparin-induced thrombocytopenia (HIT)  no Although the etiology of TTS associated with the Alphonsa Overall COVID-19 vaccine is unclear, it appears to be similar to another rare immune-mediated syndrome, heparin-induced thrombocytopenia (HIT). People with a history of an episode of an immune-mediated syndrome characterized by thrombosis and thrombocytopenia, such as HIT, should be offered a currently FDA-approved or FDA-authorized mRNA COVID-19 vaccine if it has been ?90 days since their TTS resolved. After 90 days, patients may be vaccinated with any currently FDA-approved or FDA-authorized COVID-19 vaccine, including Janssen COVID-19 Vaccine. However, people who developed TTS after their initial Alphonsa Overall vaccine should not receive a Janssen booster dose.  Experts believe the following factors do not make people more susceptible to TTS after receipt of the [employer]. People with these conditions can be vaccinated with any FDA-authorized or - approved COVID-19 vaccine, including the [employer] COVID-19 Vaccine:   A prior history of venous thromboembolism   Risk factors for venous thromboembolism (e.g., inherited or acquired thrombophilia including Factor V Leiden; prothrombin gene 20210A mutation; antiphospholipid syndrome; protein C, protein S or antithrombin deficiency   A prior history of other types of thromboses not associated with thrombocytopenia   Pregnancy, post-partum status, or receipt of hormonal contraceptives (e.g., combined oral contraceptives, patch, ring)   Additional recipient education materials can be found at  [email or web address] vaccines/safety/JJUpdate.html.    Am currently pregnant or breastfeeding  no Vaccination is recommended for all people aged 66 years and older, including people that are:   Pregnant   Breastfeeding   Trying to get pregnant now or who might become pregnant in the future   Pregnant, breastfeeding, and post-partum people 60 through 60 years of age should be aware of the rare risk of TTS after receipt of the Alphonsa Overall COVID-19 Vaccine and the availability of other FDA-authorized or -approved COVID-19 vaccines (i.e., mRNA vaccines).    Have received dermal fillers  no FDA-authorized or -approved COVID-19 vaccines can be administered to people who have received injectable dermal fillers who have no contraindications for vaccination.  Infrequently, these people might experience temporary swelling at or near the site of filler injection (usually the face or lips) following administration of a dose of an mRNA COVID-19 vaccine. These people should be advised to contact their healthcare provider if swelling develops at or near the site of dermal filler following vaccination.     Have a history of Guillain-Barr Syndrome (GBS)  no People with a history of GBS can receive any FDA-authorized or -approved COVID-19 vaccine. However, given the possible association between the [employer] and an increased risk of GBS, a patient with a history of GBS and their clinical team should discuss the availability of mRNA vaccines to offer protection against COVID-19. The highest risk has been observed in men aged 28-64 years with symptoms of GBS beginning within 42 days after Alphonsa Overall COVID-19 vaccination.  People who had GBS after receiving Janssen vaccine should be made aware of the option  to receive an mRNA COVID-19 vaccine booster at least 2 months (8 weeks) after the Janssen dose. However, Alphonsa Overall vaccine may be used as a booster, particularly if GBS occurred more than 42 days  after vaccination or was related to a non-vaccine factor. Prior to booster vaccination, a conversation between the patient and their clinical team may assist with decisions about use of a COVID-19 booster dose, including the timing of administration     Postvaccination Observation Times for People without Contraindications to Covid 19 Vaccination.  30 minutes:  People with a history of: A contraindication to another type of COVID-19 vaccine product (i.e., mRNA or viral vector COVID-19 vaccines)   Immediate (within 4 hours of exposure) non-severe allergic reaction to a COVID-19 vaccine or injectable therapies   Anaphylaxis due to any cause   Immediate allergic reaction of any severity to a non-COVID-19 vaccine   15 minutes: All other people  This patient is a 59 y.o. female that meets the FDA criteria to receive homebound vaccination. Patient or parent/caregiver understands they have the option to accept or refuse homebound vaccination.  Patient passed the pre-screening checklist and would like to proceed with homebound vaccination.  Based on questionnaire above, I recommend the patient be observed for 15 minutes.  There are an estimated no other household members/caregivers who are also interested in receiving the vaccine.    The patient has been confirmed homebound and eligible for homebound vaccination with the considerations outlined above. I will send the patient's information to our scheduling team who will reach out to schedule the patient and potential caregiver/family members for homebound vaccination.   Second [place] shot.   Angelena Form 08/05/2020 4:52 PM

## 2020-08-06 ENCOUNTER — Ambulatory Visit: Payer: Self-pay | Admitting: Surgery

## 2020-08-09 ENCOUNTER — Encounter (HOSPITAL_COMMUNITY): Payer: Self-pay

## 2020-08-09 ENCOUNTER — Other Ambulatory Visit: Payer: Self-pay

## 2020-08-09 ENCOUNTER — Emergency Department (HOSPITAL_COMMUNITY): Payer: Medicare Other

## 2020-08-09 ENCOUNTER — Inpatient Hospital Stay (HOSPITAL_COMMUNITY)
Admission: EM | Admit: 2020-08-09 | Discharge: 2020-08-21 | DRG: 643 | Disposition: A | Discharge status: Skilled Nursing Facility | Payer: Medicare Other | Attending: Internal Medicine | Admitting: Internal Medicine

## 2020-08-09 DIAGNOSIS — E1122 Type 2 diabetes mellitus with diabetic chronic kidney disease: Secondary | ICD-10-CM | POA: Diagnosis present

## 2020-08-09 DIAGNOSIS — Z79899 Other long term (current) drug therapy: Secondary | ICD-10-CM

## 2020-08-09 DIAGNOSIS — Z9049 Acquired absence of other specified parts of digestive tract: Secondary | ICD-10-CM

## 2020-08-09 DIAGNOSIS — B999 Unspecified infectious disease: Secondary | ICD-10-CM

## 2020-08-09 DIAGNOSIS — N1832 Chronic kidney disease, stage 3b: Secondary | ICD-10-CM | POA: Diagnosis present

## 2020-08-09 DIAGNOSIS — E210 Primary hyperparathyroidism: Secondary | ICD-10-CM | POA: Diagnosis not present

## 2020-08-09 DIAGNOSIS — Z20822 Contact with and (suspected) exposure to covid-19: Secondary | ICD-10-CM | POA: Diagnosis present

## 2020-08-09 DIAGNOSIS — F0390 Unspecified dementia without behavioral disturbance: Secondary | ICD-10-CM | POA: Diagnosis present

## 2020-08-09 DIAGNOSIS — E860 Dehydration: Secondary | ICD-10-CM | POA: Diagnosis present

## 2020-08-09 DIAGNOSIS — G9341 Metabolic encephalopathy: Secondary | ICD-10-CM

## 2020-08-09 DIAGNOSIS — E119 Type 2 diabetes mellitus without complications: Secondary | ICD-10-CM

## 2020-08-09 DIAGNOSIS — Z7902 Long term (current) use of antithrombotics/antiplatelets: Secondary | ICD-10-CM

## 2020-08-09 DIAGNOSIS — Z96643 Presence of artificial hip joint, bilateral: Secondary | ICD-10-CM | POA: Diagnosis present

## 2020-08-09 DIAGNOSIS — E669 Obesity, unspecified: Secondary | ICD-10-CM | POA: Diagnosis present

## 2020-08-09 DIAGNOSIS — Z8673 Personal history of transient ischemic attack (TIA), and cerebral infarction without residual deficits: Secondary | ICD-10-CM

## 2020-08-09 DIAGNOSIS — R531 Weakness: Secondary | ICD-10-CM

## 2020-08-09 DIAGNOSIS — R296 Repeated falls: Secondary | ICD-10-CM | POA: Diagnosis present

## 2020-08-09 DIAGNOSIS — L4050 Arthropathic psoriasis, unspecified: Secondary | ICD-10-CM | POA: Diagnosis present

## 2020-08-09 DIAGNOSIS — E876 Hypokalemia: Secondary | ICD-10-CM | POA: Diagnosis not present

## 2020-08-09 DIAGNOSIS — Z888 Allergy status to other drugs, medicaments and biological substances status: Secondary | ICD-10-CM

## 2020-08-09 DIAGNOSIS — D509 Iron deficiency anemia, unspecified: Secondary | ICD-10-CM | POA: Diagnosis present

## 2020-08-09 DIAGNOSIS — E871 Hypo-osmolality and hyponatremia: Secondary | ICD-10-CM | POA: Diagnosis not present

## 2020-08-09 DIAGNOSIS — G4733 Obstructive sleep apnea (adult) (pediatric): Secondary | ICD-10-CM | POA: Diagnosis present

## 2020-08-09 DIAGNOSIS — N183 Chronic kidney disease, stage 3 unspecified: Secondary | ICD-10-CM | POA: Diagnosis present

## 2020-08-09 DIAGNOSIS — Z6832 Body mass index (BMI) 32.0-32.9, adult: Secondary | ICD-10-CM

## 2020-08-09 DIAGNOSIS — N179 Acute kidney failure, unspecified: Secondary | ICD-10-CM | POA: Diagnosis not present

## 2020-08-09 DIAGNOSIS — I252 Old myocardial infarction: Secondary | ICD-10-CM

## 2020-08-09 DIAGNOSIS — I2510 Atherosclerotic heart disease of native coronary artery without angina pectoris: Secondary | ICD-10-CM | POA: Diagnosis present

## 2020-08-09 DIAGNOSIS — I129 Hypertensive chronic kidney disease with stage 1 through stage 4 chronic kidney disease, or unspecified chronic kidney disease: Secondary | ICD-10-CM | POA: Diagnosis present

## 2020-08-09 DIAGNOSIS — B952 Enterococcus as the cause of diseases classified elsewhere: Secondary | ICD-10-CM | POA: Diagnosis not present

## 2020-08-09 DIAGNOSIS — R627 Adult failure to thrive: Secondary | ICD-10-CM | POA: Diagnosis present

## 2020-08-09 DIAGNOSIS — N321 Vesicointestinal fistula: Secondary | ICD-10-CM | POA: Diagnosis present

## 2020-08-09 DIAGNOSIS — B9620 Unspecified Escherichia coli [E. coli] as the cause of diseases classified elsewhere: Secondary | ICD-10-CM | POA: Diagnosis present

## 2020-08-09 DIAGNOSIS — T8183XA Persistent postprocedural fistula, initial encounter: Secondary | ICD-10-CM | POA: Diagnosis present

## 2020-08-09 DIAGNOSIS — E440 Moderate protein-calorie malnutrition: Secondary | ICD-10-CM | POA: Diagnosis present

## 2020-08-09 DIAGNOSIS — F17210 Nicotine dependence, cigarettes, uncomplicated: Secondary | ICD-10-CM | POA: Diagnosis present

## 2020-08-09 DIAGNOSIS — Z87442 Personal history of urinary calculi: Secondary | ICD-10-CM

## 2020-08-09 DIAGNOSIS — K651 Peritoneal abscess: Secondary | ICD-10-CM

## 2020-08-09 DIAGNOSIS — I82B11 Acute embolism and thrombosis of right subclavian vein: Secondary | ICD-10-CM | POA: Diagnosis present

## 2020-08-09 DIAGNOSIS — R21 Rash and other nonspecific skin eruption: Secondary | ICD-10-CM | POA: Diagnosis present

## 2020-08-09 DIAGNOSIS — T8143XA Infection following a procedure, organ and space surgical site, initial encounter: Secondary | ICD-10-CM

## 2020-08-09 DIAGNOSIS — R5381 Other malaise: Secondary | ICD-10-CM | POA: Diagnosis present

## 2020-08-09 DIAGNOSIS — D84821 Immunodeficiency due to drugs: Secondary | ICD-10-CM

## 2020-08-09 DIAGNOSIS — Z7952 Long term (current) use of systemic steroids: Secondary | ICD-10-CM

## 2020-08-09 DIAGNOSIS — E66812 Obesity, class 2: Secondary | ICD-10-CM | POA: Diagnosis present

## 2020-08-09 DIAGNOSIS — N390 Urinary tract infection, site not specified: Secondary | ICD-10-CM | POA: Diagnosis present

## 2020-08-09 DIAGNOSIS — K5909 Other constipation: Secondary | ICD-10-CM | POA: Diagnosis present

## 2020-08-09 DIAGNOSIS — T3695XA Adverse effect of unspecified systemic antibiotic, initial encounter: Secondary | ICD-10-CM

## 2020-08-09 DIAGNOSIS — Z7982 Long term (current) use of aspirin: Secondary | ICD-10-CM

## 2020-08-09 DIAGNOSIS — F319 Bipolar disorder, unspecified: Secondary | ICD-10-CM | POA: Diagnosis present

## 2020-08-09 DIAGNOSIS — R3989 Other symptoms and signs involving the genitourinary system: Secondary | ICD-10-CM | POA: Diagnosis present

## 2020-08-09 DIAGNOSIS — F419 Anxiety disorder, unspecified: Secondary | ICD-10-CM | POA: Diagnosis present

## 2020-08-09 DIAGNOSIS — D638 Anemia in other chronic diseases classified elsewhere: Secondary | ICD-10-CM | POA: Diagnosis present

## 2020-08-09 LAB — CBC WITH DIFFERENTIAL/PLATELET
Abs Immature Granulocytes: 0.12 10*3/uL — ABNORMAL HIGH (ref 0.00–0.07)
Basophils Absolute: 0.1 10*3/uL (ref 0.0–0.1)
Basophils Relative: 0 %
Eosinophils Absolute: 0.5 10*3/uL (ref 0.0–0.5)
Eosinophils Relative: 3 %
HCT: 33.2 % — ABNORMAL LOW (ref 36.0–46.0)
Hemoglobin: 10.3 g/dL — ABNORMAL LOW (ref 12.0–15.0)
Immature Granulocytes: 1 %
Lymphocytes Relative: 6 %
Lymphs Abs: 1.1 10*3/uL (ref 0.7–4.0)
MCH: 31.2 pg (ref 26.0–34.0)
MCHC: 31 g/dL (ref 30.0–36.0)
MCV: 100.6 fL — ABNORMAL HIGH (ref 80.0–100.0)
Monocytes Absolute: 1.2 10*3/uL — ABNORMAL HIGH (ref 0.1–1.0)
Monocytes Relative: 7 %
Neutro Abs: 13.7 10*3/uL — ABNORMAL HIGH (ref 1.7–7.7)
Neutrophils Relative %: 83 %
Platelets: 231 10*3/uL (ref 150–400)
RBC: 3.3 MIL/uL — ABNORMAL LOW (ref 3.87–5.11)
RDW: 14.1 % (ref 11.5–15.5)
WBC: 16.6 10*3/uL — ABNORMAL HIGH (ref 4.0–10.5)
nRBC: 0 % (ref 0.0–0.2)

## 2020-08-09 LAB — COMPREHENSIVE METABOLIC PANEL
ALT: 22 U/L (ref 0–44)
AST: 23 U/L (ref 15–41)
Albumin: 3.3 g/dL — ABNORMAL LOW (ref 3.5–5.0)
Alkaline Phosphatase: 83 U/L (ref 38–126)
Anion gap: 9 (ref 5–15)
BUN: 23 mg/dL — ABNORMAL HIGH (ref 6–20)
CO2: 27 mmol/L (ref 22–32)
Calcium: 12.8 mg/dL — ABNORMAL HIGH (ref 8.9–10.3)
Chloride: 99 mmol/L (ref 98–111)
Creatinine, Ser: 1.78 mg/dL — ABNORMAL HIGH (ref 0.44–1.00)
GFR, Estimated: 32 mL/min — ABNORMAL LOW (ref >60)
Glucose, Bld: 116 mg/dL — ABNORMAL HIGH (ref 70–99)
Potassium: 3.5 mmol/L (ref 3.5–5.1)
Sodium: 135 mmol/L (ref 135–145)
Total Bilirubin: 0.5 mg/dL (ref 0.3–1.2)
Total Protein: 6.6 g/dL (ref 6.5–8.1)

## 2020-08-09 LAB — URINALYSIS, ROUTINE W REFLEX MICROSCOPIC
Bilirubin Urine: NEGATIVE
Glucose, UA: NEGATIVE mg/dL
Ketones, ur: NEGATIVE mg/dL
Nitrite: NEGATIVE
Protein, ur: 30 mg/dL — AB
Specific Gravity, Urine: 1.011 (ref 1.005–1.030)
WBC, UA: >50 WBC/hpf — ABNORMAL HIGH (ref 0–5)
pH: 5 (ref 5.0–8.0)

## 2020-08-09 LAB — VITAMIN D 25 HYDROXY (VIT D DEFICIENCY, FRACTURES): Vit D, 25-Hydroxy: 68.85 ng/mL (ref 30–100)

## 2020-08-09 LAB — RESP PANEL BY RT-PCR (FLU A&B, COVID) ARPGX2
Influenza A by PCR: NEGATIVE
Influenza B by PCR: NEGATIVE
SARS Coronavirus 2 by RT PCR: NEGATIVE

## 2020-08-09 LAB — CREATININE, SERUM
Creatinine, Ser: 1.49 mg/dL — ABNORMAL HIGH (ref 0.44–1.00)
GFR, Estimated: 40 mL/min — ABNORMAL LOW (ref >60)

## 2020-08-09 LAB — PHOSPHORUS: Phosphorus: 2.7 mg/dL (ref 2.5–4.6)

## 2020-08-09 LAB — MAGNESIUM: Magnesium: 2 mg/dL (ref 1.7–2.4)

## 2020-08-09 LAB — TSH: TSH: 1.144 u[IU]/mL (ref 0.350–4.500)

## 2020-08-09 LAB — CK: Total CK: 18 U/L — ABNORMAL LOW (ref 38–234)

## 2020-08-09 MED ORDER — SODIUM CHLORIDE 0.9 % IV BOLUS
1000.0000 mL | Freq: Once | INTRAVENOUS | Status: AC
  Administered 2020-08-09: 1000 mL via INTRAVENOUS

## 2020-08-09 MED ORDER — SODIUM CHLORIDE 0.9% FLUSH
3.0000 mL | Freq: Two times a day (BID) | INTRAVENOUS | Status: DC
  Administered 2020-08-13 – 2020-08-21 (×8): 3 mL via INTRAVENOUS

## 2020-08-09 MED ORDER — ONDANSETRON HCL 4 MG PO TABS
4.0000 mg | ORAL_TABLET | Freq: Four times a day (QID) | ORAL | Status: DC | PRN
  Administered 2020-08-15: 4 mg via ORAL
  Filled 2020-08-09: qty 1

## 2020-08-09 MED ORDER — POLYETHYLENE GLYCOL 3350 17 G PO PACK: 17.0000 g | PACK | Freq: Every day | ORAL | Status: DC | PRN

## 2020-08-09 MED ORDER — SODIUM CHLORIDE 0.9% FLUSH
10.0000 mL | INTRAVENOUS | Status: DC | PRN
  Administered 2020-08-18 (×2): 30 mL
  Administered 2020-08-21: 10 mL

## 2020-08-09 MED ORDER — ACETAMINOPHEN 650 MG RE SUPP: 650.0000 mg | Freq: Four times a day (QID) | RECTAL | Status: DC | PRN

## 2020-08-09 MED ORDER — SODIUM CHLORIDE 0.9% FLUSH
10.0000 mL | Freq: Two times a day (BID) | INTRAVENOUS | Status: DC
  Administered 2020-08-10 – 2020-08-16 (×6): 10 mL
  Administered 2020-08-18: 30 mL
  Administered 2020-08-19 (×2): 10 mL

## 2020-08-09 MED ORDER — ENOXAPARIN SODIUM 40 MG/0.4ML ~~LOC~~ SOLN
40.0000 mg | SUBCUTANEOUS | Status: DC
  Administered 2020-08-09 – 2020-08-15 (×6): 40 mg via SUBCUTANEOUS
  Filled 2020-08-09 (×6): qty 0.4

## 2020-08-09 MED ORDER — PREDNISONE 5 MG PO TABS
10.0000 mg | ORAL_TABLET | Freq: Every day | ORAL | Status: DC
  Administered 2020-08-10 – 2020-08-21 (×12): 10 mg via ORAL
  Filled 2020-08-09 (×13): qty 2

## 2020-08-09 MED ORDER — ACETAMINOPHEN 325 MG PO TABS
650.0000 mg | ORAL_TABLET | Freq: Four times a day (QID) | ORAL | Status: DC | PRN
  Administered 2020-08-10 – 2020-08-15 (×4): 650 mg via ORAL
  Filled 2020-08-09 (×5): qty 2

## 2020-08-09 MED ORDER — ONDANSETRON HCL 4 MG/2ML IJ SOLN: 4.0000 mg | Freq: Four times a day (QID) | INTRAMUSCULAR | Status: DC | PRN

## 2020-08-09 MED ORDER — ROSUVASTATIN CALCIUM 5 MG PO TABS
5.0000 mg | ORAL_TABLET | Freq: Every day | ORAL | Status: DC
  Administered 2020-08-09 – 2020-08-20 (×12): 5 mg via ORAL
  Filled 2020-08-09 (×12): qty 1

## 2020-08-09 MED ORDER — LACTATED RINGERS IV SOLN: INTRAVENOUS | Status: DC

## 2020-08-09 NOTE — ED Provider Notes (Signed)
[facility] DEPT Provider Note   CSN: [id number] Arrival date & time: 08/09/20  1207     History Chief Complaint  Patient presents with  . Weakness    Valerie [name] is a 59 y.o. female with PMh/o bipolar, CKD, Depression, DM with recent surgery BIB EMS for evaluation of increased generalized weakness.  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  She was discharged to rehab work with.  She was discharged home 5 days ago.  She reports that since being home, she feels like generalized weakness is gone worse.  She feels like she does not have a strength to support herself.  She states some of this had been ongoing prior to surgery but states that since surgery, she feels like it is gotten worse.  She states she has fallen twice this week because of weakness.  She states she feels like she cannot support herself.  She has not had any dizziness.  She has not any chest pain.  She states she has had some decreased appetite and has been able to tolerate small amount of things.  She states that she has not any vomiting.  She denies any diarrhea or abdominal pain.  Does feel like her urine has decreased.  He has not noted any fevers.  She denies any chest pain or difficulty breathing.  The history is provided by the patient.       Past Medical History:  Diagnosis Date  . Anemia   . Anxiety   . Arthritis    Psoriatic  . Bipolar disorder ([place])   . CKD (chronic kidney disease), stage III ([place])   . Colovesical fistula   . Depression   . DM type 2 (diabetes mellitus, type 2) ([place]) 11/14/2019  . Dyspnea   . Headache   . Neuropathy   . NSTEMI (non-ST elevated myocardial infarction) ([place]) 11/14/2019  . OSA (obstructive sleep apnea)   . Psoriatic arthritis ([place])   . Sleep apnea   . Stroke [facility])    Memory issues, March 2021    Patient Active Problem List   Diagnosis Date Noted  . Hypercalcemia 08/09/2020  . Hypoxemia 07/20/2020  . Altered  mental status 07/19/2020  . Acute respiratory failure with hypoxemia ([place]) 07/19/2020  . Anemia, chronic disease 07/18/2020  . 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 Protein-calorie malnutrition, moderate ([place]) 06/05/2020  . Weakness 06/04/2020  . CAD (coronary artery disease) 06/04/2020  . Macrocytic anemia 06/04/2020  . 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 Intra-abdominal infection   . NSTEMI (non-ST elevated myocardial infarction) ([place]) 11/14/2019  . DM type 2 (diabetes mellitus, type 2) ([place]) 11/14/2019  . Depression   . Anxiety   . Chest pain   . Obesity, Class III, BMI 40-49.9 (morbid obesity) ([place])   . CKD (chronic kidney disease), stage III ([place])   . Psoriatic arthritis ([place])   . Tobacco abuse   . OSA (obstructive sleep apnea)   . Neuropathy   . Bipolar disorder ([place])   . Osteoarthritis of right hip 11/05/2014  . Essential hypertension 10/29/2014    Past Surgical History:  Procedure Laterality Date  . APPENDECTOMY    . CHOLECYSTECTOMY    . CYSTOSCOPY WITH STENT PLACEMENT N/A 07/17/2020   Procedure: CYSTOSCOPY WITH BILATERAL FIREFLY INJECTION;  Surgeon: Raynelle Bring, MD;  Location: [facility] ORS;  Service: Urology;  Laterality: N/A;  .  FLEXIBLE SIGMOIDOSCOPY N/A 05/17/2020   Procedure: FLEXIBLE SIGMOIDOSCOPY;  Surgeon: Carol Ada, MD;  Location: [facility] ENDOSCOPY;  Service: Endoscopy;  Laterality: N/A;  . FOOT SURGERY Right   . LEFT HEART CATH AND CORONARY ANGIOGRAPHY N/A 11/14/2019   Procedure: LEFT HEART CATH AND CORONARY ANGIOGRAPHY;  Surgeon: Martinique, Peter M, MD;  Location: [facility];  Service: Cardiovascular;  Laterality: N/A;  . PROCTOSCOPY N/A 07/17/2020   Procedure: RIGID PROCTOSCOPY;  Surgeon: Michael Boston, MD;  Location: [facility];  Service: General;  Laterality: N/A;  . TONSILLECTOMY    . TOTAL HIP ARTHROPLASTY Bilateral      OB History    No obstetric history on file.     Family History  Problem Relation Age of Onset  . Breast cancer Neg Hx     Social History   Tobacco Use  . Smoking status: Current Every Day Smoker    Packs/day: 0.50  . Smokeless tobacco: Never Used  Vaping Use  . Vaping Use: Never used  Substance Use Topics  . Alcohol use: Not Currently  . Drug use: Never    Home Medications Prior to Admission medications   Medication Sig Start Date End Date Taking? Authorizing Provider  ALPRAZolam Duanne Moron) 1 MG tablet Take 1 mg by mouth 3 (three) times daily as needed for anxiety (depression.).  11/21/19   [provider]  Ascorbic Acid (VITAMIN C) 1000 MG tablet Take 1,000 mg by mouth at bedtime.    [provider]  aspirin EC 81 MG tablet Take 1 tablet (81 mg total) by mouth daily. 11/14/19 11/13/20  Samuella Cota, MD  b complex vitamins tablet Take 2 tablets by mouth at bedtime.    [provider]  Cholecalciferol (VITAMIN D-3) 1000 units CAPS Take 1,000 Units by mouth daily.     [provider]  clopidogrel (PLAVIX) 75 MG tablet Take 75 mg by mouth daily.    [provider]  docusate sodium (COLACE) 100 MG capsule Take 100-200 mg by mouth at bedtime.    [provider]  donepezil (ARICEPT) 10 MG tablet Take 10 mg by mouth daily. 10/30/19   [provider]  escitalopram (LEXAPRO) 20 MG tablet Take 30 mg by mouth daily.  09/10/15   [provider]  furosemide (LASIX) 20 MG tablet Take 1 tablet (20 mg total) by mouth daily. 07/23/20   Michael Boston, MD  HYDROcodone-acetaminophen (NORCO) 10-325 MG tablet Take 0.5-1 tablets by mouth every 6 (six) hours as needed for moderate pain or severe pain. 07/23/20   Earnstine Regal, PA-C  lamoTRIgine (LAMICTAL) 150 MG tablet Take 150 mg by mouth 2 (two) times daily.  02/27/18   [provider]  nadolol (CORGARD) 20 MG tablet Take 0.5 tablets (10 mg total) by mouth daily. 12/04/19   [name], MD  nitroGLYCERIN (NITROSTAT) 0.4 MG SL tablet Place 1 tablet (0.4 mg total) under the tongue every 5 (five) minutes as needed for chest pain. 11/14/19   Samuella Cota, MD  omeprazole (PRILOSEC) 20 MG capsule Take 1 capsule (20 mg total) by mouth 2 (two) times daily before a meal. 06/12/20 10/10/20  Barb Merino, MD  ondansetron (ZOFRAN) 8 MG tablet Take 8 mg by mouth 2 (two) times daily as needed for nausea or vomiting.  05/02/20   [provider]  predniSONE (DELTASONE) 10 MG tablet Take 5-10 mg by mouth See admin instructions. Take 5 mg by mouth every other day, alternating with 10 mg  on opposing days    [provider]  rizatriptan (MAXALT-MLT) 10 MG disintegrating tablet Take 10 mg by mouth See admin instructions. Take one tablet (10 mg) by mouth daily as needed for "floaters", May repeat in 2 hours if still needed    [provider]  rosuvastatin (CRESTOR) 5 MG tablet Take 1 tablet (5 mg total) by mouth daily. 03/21/20 07/03/20  [name], MD  traZODone (DESYREL) 100 MG tablet Take 2 tablets (200 mg total) by mouth at bedtime. 07/23/20   Earnstine Regal, PA-C    Allergies    Carbamazepine and Sertraline hcl  Review of Systems   Review of Systems  Constitutional: Positive for appetite change. Negative for fever.  Respiratory: Negative for cough and shortness of breath.   Cardiovascular: Negative for chest pain.  Gastrointestinal: Negative for abdominal pain, nausea and vomiting.  Genitourinary: Negative for dysuria and hematuria.  Neurological: Positive for weakness (generalized). Negative for headaches.  All other systems reviewed and are negative.   Physical Exam Updated Vital Signs BP 114/63   Pulse (!) 57   Temp 98.6 F (37 C) (Oral)   Resp (!) 21   SpO2 98%   Physical Exam Vitals and nursing note reviewed.  Constitutional:      Appearance: Normal appearance. She is well-developed and well-nourished.  HENT:     Head:  Normocephalic and atraumatic.     Mouth/Throat:     Mouth: Oropharynx is clear and moist. Mucous membranes are dry.     Comments: Dry mucous membranes none. Eyes:     General: Lids are normal.     Extraocular Movements: EOM normal.     Conjunctiva/sclera: Conjunctivae normal.     Pupils: Pupils are equal, round, and reactive to light.     Comments: PERRL. EOMs intact. No nystagmus. No neglect.   Cardiovascular:     Rate and Rhythm: Normal rate and regular rhythm.     Pulses: Normal pulses.     Heart sounds: Normal heart sounds. No murmur heard. No friction rub. No gallop.   Pulmonary:     Effort: Pulmonary effort is normal.     Breath sounds: Normal breath sounds.     Comments: Lungs clear to auscultation bilaterally.  Symmetric chest rise.  No wheezing, rales, rhonchi. Abdominal:     Palpations: Abdomen is soft. Abdomen is not rigid.     Tenderness: There is no abdominal tenderness. There is no guarding.     Comments: Abdomen is soft, non-distended, non-tender. No rigidity, No guarding. No peritoneal signs.  Healing abdominal incision sites with no surrounding warmth, erythema, drainage.  Musculoskeletal:        General: Normal range of motion.     Cervical back: Full passive range of motion without pain.  Skin:    General: Skin is warm and dry.     Capillary Refill: Capillary refill takes less than 2 seconds.  Neurological:     Mental Status: She is alert and oriented to person, place, and time.     Comments: Cranial nerves III-XII intact Follows commands, Moves all extremities  5/5 strength to BUE and BLE  Sensation intact throughout all major nerve distributions No slurred speech. No facial droop.   Psychiatric:        Mood and Affect: Mood and affect normal.        Speech: Speech normal.     ED Results / Procedures / Treatments   Labs (all labs ordered are listed, but only  abnormal results are displayed) Labs Reviewed  URINALYSIS, ROUTINE W REFLEX MICROSCOPIC -  Abnormal; Notable for the following components:      Result Value   APPearance CLOUDY (*)    Hgb urine dipstick MODERATE (*)    Protein, ur 30 (*)    Leukocytes,Ua LARGE (*)    WBC, UA >50 (*)    Bacteria, UA MANY (*)    All other components within normal limits  COMPREHENSIVE METABOLIC PANEL - Abnormal; Notable for the following components:   Glucose, Bld 116 (*)    BUN 23 (*)    Creatinine, Ser 1.78 (*)    Calcium 12.8 (*)    Albumin 3.3 (*)    GFR, Estimated 32 (*)    All other components within normal limits  CBC WITH DIFFERENTIAL/PLATELET - Abnormal; Notable for the following components:   WBC 16.6 (*)    RBC 3.30 (*)    Hemoglobin 10.3 (*)    HCT 33.2 (*)    MCV 100.6 (*)    Neutro Abs 13.7 (*)    Monocytes Absolute 1.2 (*)    Abs Immature Granulocytes 0.12 (*)    All other components within normal limits  RESP PANEL BY RT-PCR (FLU A&B, COVID) ARPGX2  URINE CULTURE  CBC  CREATININE, SERUM  MAGNESIUM  PHOSPHORUS  TSH  PTH, INTACT AND CALCIUM  PTH-RELATED PEPTIDE  CALCITRIOL (1,25 DI-OH VIT D)  VITAMIN D 25 HYDROXY (VIT D DEFICIENCY, FRACTURES)  COMPREHENSIVE METABOLIC PANEL  CBC  CK    EKG None  Radiology CT Head Wo Contrast  Result Date: 08/09/2020 CLINICAL DATA:  Increased weakness, recent abdominal surgery EXAM: CT HEAD WITHOUT CONTRAST TECHNIQUE: Contiguous axial images were obtained from the base of the skull through the vertex without intravenous contrast. COMPARISON:  07/19/2020 FINDINGS: Brain: There is no acute intracranial hemorrhage, mass effect, or edema. Gray-white differentiation is preserved. There is no extra-axial fluid collection. Ventricles and sulci are within normal limits in size and configuration. Vascular: No hyperdense vessel or unexpected calcification. Skull: Calvarium is unremarkable. Sinuses/Orbits: No acute finding. Other: None. IMPRESSION: No acute intracranial abnormality. Electronically Signed   By: Macy Mis M.D.   On:  08/09/2020 15:05   DG Chest Portable 1 View  Result Date: 08/09/2020 CLINICAL DATA:  Weakness EXAM: PORTABLE CHEST 1 VIEW COMPARISON:  None. FINDINGS: Stable cardiomediastinal contours with top-normal heart size. No new consolidation or edema. Mild chronic interstitial prominence. No pleural effusion or pneumothorax. IMPRESSION: No acute process in the chest. Electronically Signed   By: Macy Mis M.D.   On: 08/09/2020 14:51    Procedures Procedures (including critical care time)  Medications Ordered in ED Medications  enoxaparin (LOVENOX) injection 40 mg (has no administration in time range)  sodium chloride flush (NS) 0.9 % injection 3 mL (has no administration in time range)  acetaminophen (TYLENOL) tablet 650 mg (has no administration in time range)    Or  acetaminophen (TYLENOL) suppository 650 mg (has no administration in time range)  polyethylene glycol (MIRALAX / GLYCOLAX) packet 17 g (has no administration in time range)  ondansetron (ZOFRAN) tablet 4 mg (has no administration in time range)    Or  ondansetron (ZOFRAN) injection 4 mg (has no administration in time range)  lactated ringers infusion (has no administration in time range)  rosuvastatin (CRESTOR) tablet 5 mg (has no administration in time range)  predniSONE (DELTASONE) tablet 10 mg (has no administration in time range)  sodium chloride 0.9 % bolus 1,000 mL (  1,000 mLs Intravenous New Bag/Given 08/09/20 1333)    ED Course  I have reviewed the triage vital signs and the nursing notes.  Pertinent labs & imaging results that were available during my care of the patient were reviewed by me and considered in my medical decision making (see chart for details).    MDM Rules/Calculators/A&P                           59 year old female who presents for evaluation of generalized weakness.  Recent abdominal surgery and November.  Was discharged to rehab.  She recently returned home this past week.  Since then, she  has had increased weakness.  She has fallen twice.  On initial arrival, she is afebrile nontoxic-appearing.  Vital signs are stable.  On exam, she is stable.  Vital signs are stable.  She is on 2 L which is baseline.  Her abdominal incisions seem to be healing well.  No abdominal tenderness.  No neuro deficits noted on exam.  She describes more systemic weakness rather than focal.  Will obtain CT head given recent falls as well as baseline labs.  CBC shows leukocytosis of 16.6.  She has had leukocytosis previously.  She has been on prednisone so question of that is causing a bump.  Her hemoglobin is low at 10.3 but this appears to be baseline is actually improved from her most recent.  CMP shows creatinine of 1.78, BUN of 23.  During her admission, she had had creatinines between 1.2-1.5.  CT head negative for any acute abnormalities. Chest x-ray is unremarkable.  At this time, patient with worsening generalized weakness as removal of multiple falls. Patient reports that before her surgery, she could use a walker to get around but now she is so weak that she cannot even use that. I think she would benefit from admission for IV fluids for AKI. Social work has been consulted regarding possible placement. Patient is unsure if she would like long term placement. She has home health in place already and would like to eventually get back home.   Discussed patient with Dr. Reesa Chew (hospitalist) who accepts patient for admission.   Portions of this note were generated with [employer]. Dictation errors may occur despite best attempts at proofreading.   Final Clinical Impression(s) / ED Diagnoses Final diagnoses:  Generalized weakness  AKI (acute kidney injury) [facility])    Rx / DC Orders ED Discharge Orders    None       Desma Mcgregor 08/09/20 1722    Lacretia Leigh, MD 08/10/20 1328

## 2020-08-09 NOTE — ED Provider Notes (Signed)
Medical screening examination/treatment/procedure(s) were conducted as a shared visit with non-physician practitioner(s) and myself.  I personally evaluated the patient during the encounter.    59 year old female presents with increasing weakness times several days.  Notes decreased oral intake.  Has evidence of dehydration.  Will admit to the hospital service   Lacretia Leigh, MD 08/09/20 1419

## 2020-08-09 NOTE — H&P (Signed)
History and Physical    Valerie [name] PHX:[id number] DOB: Aug 24, 1961 DOA: 08/09/2020  PCP: Otelia Santee I, MD   Patient coming from: Home  I have personally briefly reviewed patient's old medical records in [place]  Chief Complaint: Generalized weakness  HPI: Valerie [name] is a 59 y.o. 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  Patient was accompanied by her caregiver.  According to caregiver she is having multiple falls and unable to get up from floor, they have to call fire department twice in the last 2 days to help.  Patient is also very somnolent, sleeping randomly while talking.  Patient denies any nausea or vomiting.  Decreased appetite, but stating that her p.o. intake is improving.  No fever or chills.  No pain.  No shortness of breath or chest pain.  No urinary symptoms. Has a remote history of renal calculi.  ED Course: Hemodynamically stable, labs positive for neutrophilic predominant leukocytosis at 6.6, hemoglobin of 10.3, BUN 23, creatinine of 1.78 with baseline around 1.2-1.3, hypercalcemia with corrected calcium of 13.4, COVID-19 PCR negative, UA with positive dipstick, large leukocytes and many bacteria which seems chronic, might be a colonization.CT head without any acute abnormality.  Chest x-ray within normal limit.  Review of Systems: As per HPI otherwise 10 point review of systems negative.   Past Medical History:  Diagnosis Date  . Anemia   . Anxiety   . Arthritis    Psoriatic  . Bipolar disorder ([place])   . CKD (chronic kidney disease), stage III ([place])   . Colovesical fistula   . Depression    . DM type 2 (diabetes mellitus, type 2) ([place]) 11/14/2019  . Dyspnea   . Headache   . Neuropathy   . NSTEMI (non-ST elevated myocardial infarction) ([place]) 11/14/2019  . OSA (obstructive sleep apnea)   . Psoriatic arthritis ([place])   . Sleep apnea   . Stroke [facility])    Memory issues, March 2021    Past Surgical History:  Procedure Laterality Date  . APPENDECTOMY    . CHOLECYSTECTOMY    . CYSTOSCOPY WITH STENT PLACEMENT N/A 07/17/2020   Procedure: CYSTOSCOPY WITH BILATERAL FIREFLY INJECTION;  Surgeon: Raynelle Bring, MD;  Location: [facility];  Service: Urology;  Laterality: N/A;  . FLEXIBLE SIGMOIDOSCOPY N/A 05/17/2020   Procedure: FLEXIBLE SIGMOIDOSCOPY;  Surgeon: Carol Ada, MD;  Location: [facility];  Service: Endoscopy;  Laterality: N/A;  . FOOT SURGERY Right   . LEFT HEART CATH AND CORONARY ANGIOGRAPHY N/A 11/14/2019   Procedure: LEFT HEART CATH AND CORONARY ANGIOGRAPHY;  Surgeon: Martinique, Peter M, MD;  Location: [facility];  Service: Cardiovascular;  Laterality: N/A;  . PROCTOSCOPY N/A 07/17/2020   Procedure: RIGID PROCTOSCOPY;  Surgeon: Michael Boston, MD;  Location: [facility];  Service: General;  Laterality: N/A;  . TONSILLECTOMY    . TOTAL HIP ARTHROPLASTY Bilateral      reports that she has been smoking. She has been smoking about 0.50 packs per day. She has never used smokeless tobacco. She reports previous alcohol use. She reports that she does not use drugs.  Allergies  Allergen Reactions  . Carbamazepine Other (See Comments)    Parkinsons like symptoms tremors  .  Sertraline Hcl Other (See Comments)    Unknown reaction    Family History  Problem Relation Age of Onset  . Breast cancer Neg Hx     Prior to Admission medications   Medication Sig Start Date End Date Taking? Authorizing Provider  ALPRAZolam Duanne Moron) 1 MG tablet Take 1 mg by mouth 3 (three) times daily as needed for anxiety (depression.).  11/21/19   [provider]  Ascorbic Acid (VITAMIN C)  1000 MG tablet Take 1,000 mg by mouth at bedtime.    [provider]  aspirin EC 81 MG tablet Take 1 tablet (81 mg total) by mouth daily. 11/14/19 11/13/20  Samuella Cota, MD  b complex vitamins tablet Take 2 tablets by mouth at bedtime.    [provider]  Cholecalciferol (VITAMIN D-3) 1000 units CAPS Take 1,000 Units by mouth daily.     [provider]  clopidogrel (PLAVIX) 75 MG tablet Take 75 mg by mouth daily.    [provider]  docusate sodium (COLACE) 100 MG capsule Take 100-200 mg by mouth at bedtime.    [provider]  donepezil (ARICEPT) 10 MG tablet Take 10 mg by mouth daily. 10/30/19   [provider]  escitalopram (LEXAPRO) 20 MG tablet Take 30 mg by mouth daily.  09/10/15   [provider]  furosemide (LASIX) 20 MG tablet Take 1 tablet (20 mg total) by mouth daily. 07/23/20   Michael Boston, MD  HYDROcodone-acetaminophen (NORCO) 10-325 MG tablet Take 0.5-1 tablets by mouth every 6 (six) hours as needed for moderate pain or severe pain. 07/23/20   Earnstine Regal, PA-C  lamoTRIgine (LAMICTAL) 150 MG tablet Take 150 mg by mouth 2 (two) times daily.  02/27/18   [provider]  nadolol (CORGARD) 20 MG tablet Take 0.5 tablets (10 mg total) by mouth daily. 12/04/19   [name], MD  nitroGLYCERIN (NITROSTAT) 0.4 MG SL tablet Place 1 tablet (0.4 mg total) under the tongue every 5 (five) minutes as needed for chest pain. 11/14/19   Samuella Cota, MD  omeprazole (PRILOSEC) 20 MG capsule Take 1 capsule (20 mg total) by mouth 2 (two) times daily before a meal. 06/12/20 10/10/20  Barb Merino, MD  ondansetron (ZOFRAN) 8 MG tablet Take 8 mg by mouth 2 (two) times daily as needed for nausea or vomiting.  05/02/20   [provider]  predniSONE (DELTASONE) 10 MG tablet Take 5-10 mg by mouth See admin instructions. Take 5 mg by mouth every other day, alternating with 10 mg on opposing days    [provider]  rizatriptan (MAXALT-MLT) 10 MG disintegrating tablet Take 10 mg by mouth See admin instructions. Take one tablet (10 mg) by mouth daily as needed for "floaters", May repeat in 2 hours if still needed    [provider]  rosuvastatin (CRESTOR) 5 MG tablet Take 1 tablet (5 mg total) by mouth daily. 03/21/20 07/03/20  [name], MD  traZODone (DESYREL) 100 MG tablet Take 2 tablets (200 mg total) by mouth at bedtime. 07/23/20   Earnstine Regal, PA-C    Physical Exam: Vitals:   08/09/20 1545 08/09/20 1600 08/09/20 1615 08/09/20 1630  BP:  (!) 105/58  114/63  Pulse: (!) 59 62 62 (!) 57  Resp: (!) 23 (!) 22 20 (!) 21  Temp:      TempSrc:      SpO2: (!) 89% 97% 100% 98%    General: Vital signs reviewed.  Patient  is well-developed and well-nourished, in no acute distress and cooperative with exam, appears lethargic. Head: Normocephalic and atraumatic. Eyes: EOMI, conjunctivae normal, no scleral icterus.  ENMT: Mucous membranes are dry. Neck: Supple, trachea midline, normal ROM,  Cardiovascular: RRR, S1 normal, S2 normal, no murmurs, gallops, or rubs. Pulmonary/Chest: Clear to auscultation bilaterally, no wheezes, rales, or rhonchi. Abdominal: Soft, non-tender, non-distended, BS +, well-healing surgical scars. Extremities: No lower extremity edema bilaterally,  pulses symmetric and intact bilaterally. No cyanosis or clubbing. Neurological: A&O x3, Strength is symmetric bilaterally, cranial nerve II-XII are grossly intact, no focal motor deficit, sensory intact to light touch bilaterally.  Psychiatric: Normal mood and affect.  Labs on Admission: I have personally reviewed following labs and imaging studies  CBC: Recent Labs  Lab 08/09/20 1312  WBC 16.6*  NEUTROABS 13.7*  HGB 10.3*  HCT 33.2*  MCV 100.6*  PLT 175   Basic Metabolic Panel: Recent Labs  Lab 08/09/20 1312  NA 135  K 3.5  CL 99  CO2 27  GLUCOSE 116*  BUN 23*  CREATININE 1.78*   CALCIUM 12.8*   GFR: CrCl cannot be calculated (Unknown ideal weight.). Liver Function Tests: Recent Labs  Lab 08/09/20 1312  AST 23  ALT 22  ALKPHOS 83  BILITOT 0.5  PROT 6.6  ALBUMIN 3.3*   No results for input(s): LIPASE, AMYLASE in the last 168 hours. No results for input(s): AMMONIA in the last 168 hours. Coagulation Profile: No results for input(s): INR, PROTIME in the last 168 hours. Cardiac Enzymes: No results for input(s): CKTOTAL, CKMB, CKMBINDEX, TROPONINI in the last 168 hours. BNP (last 3 results) No results for input(s): PROBNP in the last 8760 hours. HbA1C: No results for input(s): HGBA1C in the last 72 hours. CBG: No results for input(s): GLUCAP in the last 168 hours. Lipid Profile: No results for input(s): CHOL, HDL, LDLCALC, TRIG, CHOLHDL, LDLDIRECT in the last 72 hours. Thyroid Function Tests: No results for input(s): TSH, T4TOTAL, FREET4, T3FREE, THYROIDAB in the last 72 hours. Anemia Panel: No results for input(s): VITAMINB12, FOLATE, FERRITIN, TIBC, IRON, RETICCTPCT in the last 72 hours. Urine analysis:    Component Value Date/Time   COLORURINE YELLOW 08/09/2020 1237   APPEARANCEUR CLOUDY (A) 08/09/2020 1237   LABSPEC 1.011 08/09/2020 1237   PHURINE 5.0 08/09/2020 1237   GLUCOSEU NEGATIVE 08/09/2020 1237   HGBUR MODERATE (A) 08/09/2020 1237   BILIRUBINUR NEGATIVE 08/09/2020 1237   KETONESUR NEGATIVE 08/09/2020 1237   PROTEINUR 30 (A) 08/09/2020 1237   NITRITE NEGATIVE 08/09/2020 1237   LEUKOCYTESUR LARGE (A) 08/09/2020 1237    Radiological Exams on Admission: CT Head Wo Contrast  Result Date: 08/09/2020 CLINICAL DATA:  Increased weakness, recent abdominal surgery EXAM: CT HEAD WITHOUT CONTRAST TECHNIQUE: Contiguous axial images were obtained from the base of the skull through the vertex without intravenous contrast. COMPARISON:  07/19/2020 FINDINGS: Brain: There is no acute intracranial hemorrhage, mass effect, or edema. Gray-white  differentiation is preserved. There is no extra-axial fluid collection. Ventricles and sulci are within normal limits in size and configuration. Vascular: No hyperdense vessel or unexpected calcification. Skull: Calvarium is unremarkable. Sinuses/Orbits: No acute finding. Other: None. IMPRESSION: No acute intracranial abnormality. Electronically Signed   By: Macy Mis M.D.   On: 08/09/2020 15:05   DG Chest Portable 1 View  Result Date: 08/09/2020 CLINICAL DATA:  Weakness EXAM: PORTABLE CHEST 1 VIEW COMPARISON:  None. FINDINGS: Stable cardiomediastinal contours with top-normal heart size. No new consolidation or edema. Mild chronic interstitial prominence.  No pleural effusion or pneumothorax. IMPRESSION: No acute process in the chest. Electronically Signed   By: Macy Mis M.D.   On: 08/09/2020 14:51    Assessment/Plan Active Problems:   Hypercalcemia   Hypercalcemia.  Patient's generalized weakness and increased somnolence can be secondary to hypercalcemia.  Corrected calcium of 13.4.  Appears very dry.  Seems chronic, as it was present in different range since March 2021, no other records available.  Patient also has CKD.  No history of malignancy.  Could not find any hypercalcemia work-up in her chart.  Received 1 L of normal saline bolus in ED. -Start her on LR at 125 mL/h. -Check hypercalcemia labs which include parathyroid intake, vitamin D, parathyroid related hormone, TSH and a.m. cortisol.  Patient is also chronically on steroid for her psoriatic arthritis.  Frequent falls.  Patient was on floor for more than an hour twice since yesterday, requiring fire department help. -Check CK -PT evaluation  AKI with CKD stage IIIb.  Patient appears dry.  Baseline creatinine around 1.3-1.4. -Giving some IV fluid. -Monitor renal function. -Avoid nephrotoxins.  Psoriatic arthritis.  Most of her immunosuppressive therapy is being held after surgery.  Per patient plan was to restart next  year. -Continue prednisone at 10 mg daily.  Dirty urine/UTI/colonization.  Patient with multiple UA with pyuria, no urinary symptoms but do have leukocytosis.  History of E. coli with good sensitivity which were adequately treated before, might be colonization. -Check urine culture -Hold antibiotics at this time.  Type 2 diabetes.  Listed in her chart.  A1c was 5.2 in November 2021.  She is not on any medication at home. -Continue to monitor.  History of bipolar disorder. -Continue home meds once med rec is completed.  Hypertension.  Blood pressure within goal. -We will hold home dose of Lasix due to AKI and dehydration. -Can restart Nardella once med rec is complete.  History of diverticulitis, perforation and colovesical fistula s/p repair. Healing well.  No acute concern  DVT prophylaxis: Lovenox Code Status: Full code Family Communication: Caregiver was updated at bedside. Disposition Plan: To be determined Consults called: None Admission status: Observation   Lorella Nimrod MD [facility]  If 7PM-7AM, please contact night-coverage [email or web address]  08/09/2020, 5:04 PM   This record has been created using Systems analyst. Errors have been sought and corrected,but may not always be located. Such creation errors do not reflect on the standard of care.

## 2020-08-09 NOTE — ED Notes (Signed)
Placed Pt on purewick

## 2020-08-09 NOTE — ED Triage Notes (Signed)
PT BIB EMS. Pt is c/o of increased weakness. Pt had abd surgery 3 weeks ago; was in rehab for 2 weeks and has been home for a week. Pts physical therapist came out today and stated that pt is weak and needs to be seen at hospital.    BP-106/70 HR-68 Temp-98.5 CBG-130 RR-18 O2- 95% RA; 100% on 2L [place] (pt wears this at home)

## 2020-08-10 DIAGNOSIS — N321 Vesicointestinal fistula: Secondary | ICD-10-CM | POA: Diagnosis present

## 2020-08-10 DIAGNOSIS — D84821 Immunodeficiency due to drugs: Secondary | ICD-10-CM | POA: Diagnosis present

## 2020-08-10 DIAGNOSIS — Z20822 Contact with and (suspected) exposure to covid-19: Secondary | ICD-10-CM | POA: Diagnosis present

## 2020-08-10 DIAGNOSIS — F419 Anxiety disorder, unspecified: Secondary | ICD-10-CM | POA: Diagnosis present

## 2020-08-10 DIAGNOSIS — F319 Bipolar disorder, unspecified: Secondary | ICD-10-CM | POA: Diagnosis present

## 2020-08-10 DIAGNOSIS — N390 Urinary tract infection, site not specified: Secondary | ICD-10-CM | POA: Diagnosis present

## 2020-08-10 DIAGNOSIS — F0390 Unspecified dementia without behavioral disturbance: Secondary | ICD-10-CM | POA: Diagnosis present

## 2020-08-10 DIAGNOSIS — N179 Acute kidney failure, unspecified: Secondary | ICD-10-CM | POA: Diagnosis present

## 2020-08-10 DIAGNOSIS — E871 Hypo-osmolality and hyponatremia: Secondary | ICD-10-CM | POA: Diagnosis not present

## 2020-08-10 DIAGNOSIS — G9341 Metabolic encephalopathy: Secondary | ICD-10-CM | POA: Diagnosis present

## 2020-08-10 DIAGNOSIS — R627 Adult failure to thrive: Secondary | ICD-10-CM | POA: Diagnosis present

## 2020-08-10 DIAGNOSIS — N1832 Chronic kidney disease, stage 3b: Secondary | ICD-10-CM | POA: Diagnosis present

## 2020-08-10 DIAGNOSIS — R531 Weakness: Secondary | ICD-10-CM | POA: Diagnosis not present

## 2020-08-10 DIAGNOSIS — D509 Iron deficiency anemia, unspecified: Secondary | ICD-10-CM | POA: Diagnosis present

## 2020-08-10 DIAGNOSIS — R296 Repeated falls: Secondary | ICD-10-CM | POA: Diagnosis present

## 2020-08-10 DIAGNOSIS — L4050 Arthropathic psoriasis, unspecified: Secondary | ICD-10-CM | POA: Diagnosis present

## 2020-08-10 DIAGNOSIS — G4733 Obstructive sleep apnea (adult) (pediatric): Secondary | ICD-10-CM | POA: Diagnosis present

## 2020-08-10 DIAGNOSIS — T8183XA Persistent postprocedural fistula, initial encounter: Secondary | ICD-10-CM | POA: Diagnosis present

## 2020-08-10 DIAGNOSIS — E440 Moderate protein-calorie malnutrition: Secondary | ICD-10-CM | POA: Diagnosis present

## 2020-08-10 DIAGNOSIS — I129 Hypertensive chronic kidney disease with stage 1 through stage 4 chronic kidney disease, or unspecified chronic kidney disease: Secondary | ICD-10-CM | POA: Diagnosis present

## 2020-08-10 DIAGNOSIS — E210 Primary hyperparathyroidism: Secondary | ICD-10-CM | POA: Diagnosis present

## 2020-08-10 DIAGNOSIS — K651 Peritoneal abscess: Secondary | ICD-10-CM | POA: Diagnosis present

## 2020-08-10 DIAGNOSIS — I82B11 Acute embolism and thrombosis of right subclavian vein: Secondary | ICD-10-CM | POA: Diagnosis present

## 2020-08-10 DIAGNOSIS — E1122 Type 2 diabetes mellitus with diabetic chronic kidney disease: Secondary | ICD-10-CM | POA: Diagnosis present

## 2020-08-10 LAB — CBC
HCT: 27.6 % — ABNORMAL LOW (ref 36.0–46.0)
Hemoglobin: 8.5 g/dL — ABNORMAL LOW (ref 12.0–15.0)
MCH: 31 pg (ref 26.0–34.0)
MCHC: 30.8 g/dL (ref 30.0–36.0)
MCV: 100.7 fL — ABNORMAL HIGH (ref 80.0–100.0)
Platelets: 200 10*3/uL (ref 150–400)
RBC: 2.74 MIL/uL — ABNORMAL LOW (ref 3.87–5.11)
RDW: 14.1 % (ref 11.5–15.5)
WBC: 11.9 10*3/uL — ABNORMAL HIGH (ref 4.0–10.5)
nRBC: 0 % (ref 0.0–0.2)

## 2020-08-10 LAB — COMPREHENSIVE METABOLIC PANEL
ALT: 16 U/L (ref 0–44)
AST: 15 U/L (ref 15–41)
Albumin: 2.4 g/dL — ABNORMAL LOW (ref 3.5–5.0)
Alkaline Phosphatase: 69 U/L (ref 38–126)
Anion gap: 8 (ref 5–15)
BUN: 16 mg/dL (ref 6–20)
CO2: 28 mmol/L (ref 22–32)
Calcium: 11.9 mg/dL — ABNORMAL HIGH (ref 8.9–10.3)
Chloride: 104 mmol/L (ref 98–111)
Creatinine, Ser: 1.56 mg/dL — ABNORMAL HIGH (ref 0.44–1.00)
GFR, Estimated: 38 mL/min — ABNORMAL LOW (ref >60)
Glucose, Bld: 86 mg/dL (ref 70–99)
Potassium: 3.9 mmol/L (ref 3.5–5.1)
Sodium: 140 mmol/L (ref 135–145)
Total Bilirubin: 0.5 mg/dL (ref 0.3–1.2)
Total Protein: 5.3 g/dL — ABNORMAL LOW (ref 6.5–8.1)

## 2020-08-10 LAB — CORTISOL-AM, BLOOD: Cortisol - AM: 7.8 ug/dL (ref 6.7–22.6)

## 2020-08-10 MED ORDER — DONEPEZIL HCL 10 MG PO TABS
10.0000 mg | ORAL_TABLET | Freq: Every day | ORAL | Status: DC
  Administered 2020-08-10 – 2020-08-21 (×12): 10 mg via ORAL
  Filled 2020-08-10 (×12): qty 1

## 2020-08-10 MED ORDER — FERROUS SULFATE 325 (65 FE) MG PO TABS
325.0000 mg | ORAL_TABLET | Freq: Two times a day (BID) | ORAL | Status: DC
  Administered 2020-08-10 – 2020-08-14 (×9): 325 mg via ORAL
  Filled 2020-08-10 (×9): qty 1

## 2020-08-10 MED ORDER — ZOLEDRONIC ACID 5 MG/100ML IV SOLN: 5.0000 mg | Freq: Once | INTRAVENOUS | Status: DC

## 2020-08-10 MED ORDER — ASCORBIC ACID 500 MG PO TABS
1000.0000 mg | ORAL_TABLET | Freq: Every day | ORAL | Status: DC
  Administered 2020-08-10 – 2020-08-20 (×11): 1000 mg via ORAL
  Filled 2020-08-10 (×11): qty 2

## 2020-08-10 MED ORDER — ASPIRIN EC 81 MG PO TBEC
81.0000 mg | DELAYED_RELEASE_TABLET | Freq: Every day | ORAL | Status: DC
  Administered 2020-08-10 – 2020-08-21 (×11): 81 mg via ORAL
  Filled 2020-08-10 (×12): qty 1

## 2020-08-10 MED ORDER — SODIUM CHLORIDE 0.9 % IV SOLN: INTRAVENOUS | Status: DC

## 2020-08-10 MED ORDER — ZOLEDRONIC ACID 4 MG/5ML IV CONC
4.0000 mg | Freq: Once | INTRAVENOUS | Status: AC
  Administered 2020-08-10: 4 mg via INTRAVENOUS
  Filled 2020-08-10 (×2): qty 5

## 2020-08-10 MED ORDER — POLYETHYLENE GLYCOL 3350 17 G PO PACK
17.0000 g | PACK | Freq: Every day | ORAL | Status: DC
  Administered 2020-08-10 – 2020-08-12 (×3): 17 g via ORAL
  Filled 2020-08-10 (×9): qty 1

## 2020-08-10 MED ORDER — CALCITONIN (SALMON) 200 UNIT/ACT NA SOLN
1.0000 | Freq: Once | NASAL | Status: AC
  Administered 2020-08-10: 1 via NASAL
  Filled 2020-08-10: qty 3.7

## 2020-08-10 MED ORDER — ALPRAZOLAM 1 MG PO TABS
1.0000 mg | ORAL_TABLET | Freq: Two times a day (BID) | ORAL | Status: DC
  Administered 2020-08-10 – 2020-08-13 (×7): 1 mg via ORAL
  Administered 2020-08-15: 0.5 mg via ORAL
  Administered 2020-08-15 – 2020-08-21 (×8): 1 mg via ORAL
  Filled 2020-08-10 (×21): qty 1

## 2020-08-10 MED ORDER — PANTOPRAZOLE SODIUM 40 MG PO TBEC
40.0000 mg | DELAYED_RELEASE_TABLET | Freq: Every day | ORAL | Status: DC
  Administered 2020-08-10 – 2020-08-21 (×12): 40 mg via ORAL
  Filled 2020-08-10 (×12): qty 1

## 2020-08-10 MED ORDER — LAMOTRIGINE 25 MG PO TABS
150.0000 mg | ORAL_TABLET | Freq: Two times a day (BID) | ORAL | Status: DC
  Administered 2020-08-10 – 2020-08-21 (×23): 150 mg via ORAL
  Filled 2020-08-10: qty 1
  Filled 2020-08-10 (×10): qty 2
  Filled 2020-08-10: qty 1
  Filled 2020-08-10 (×10): qty 2
  Filled 2020-08-10: qty 1

## 2020-08-10 MED ORDER — ESCITALOPRAM OXALATE 20 MG PO TABS
30.0000 mg | ORAL_TABLET | Freq: Every day | ORAL | Status: DC
  Administered 2020-08-10 – 2020-08-21 (×12): 30 mg via ORAL
  Filled 2020-08-10 (×12): qty 1

## 2020-08-10 NOTE — Progress Notes (Addendum)
PROGRESS NOTE    Valerie [name]  JKD:[id number] DOB: 21-Jun-1961 DOA: 08/09/2020 PCP: Ophelia Shoulder, MD   Brief Narrative: Taken from H&P. Valerie [name] is a 59 y.o. 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 Found to have hypercalcemia, corrected calcium of 13.4.  Started her on IV fluid and sent hypercalcemia labs.  Subjective: Patient was very somnolent when seen today.  Daughter at bedside.  Last bowel movement was 3 to 4 days ago.  History of constipation.  Assessment & Plan:   Active Problems:   Generalized weakness   Hypercalcemia  Hypercalcemia.  Patient's generalized weakness and increased somnolence can be secondary to hypercalcemia.  Corrected calcium of 13.4>>13.2.  Seems chronic, as it was present in different range since March 2021, no other records available.  Patient also has CKD.  No history of malignancy.  Could not find any hypercalcemia work-up in her chart.  Received 1 L of normal saline bolus in ED, and received LR at 125 mL/h overnight with very small change in her calcium.  TSH within normal limits, a.m. cortisol of 7.8 which is within lower normal limit but patient is also chronically on steroid for psoriatic arthritis.  Vitamin D level at 68.  Rest of the labs are still pending. History of constipation. Has symptomatic hypercalcemia. -Switch her IV fluid with normal saline. -Give her 1 dose of zoledronic acid. -Give her 1 nasal spray of calcitonin today. -Continue to monitor calcium. -Check urinary calcium. -Follow-up lab results to direct our management.  Constipation.  Can be secondary to  hyperglycemia.  Patient also has poor p.o. intake. -Start her on scheduled bowel regimen.  Frequent falls.  Patient was on floor for more than an hour twice since yesterday, requiring fire department help.  CK within normal limit. -PT evaluation-recommending SNF placement.  TOC to check if she still has some benefits left as she was recently sent home from SNF.  AKI with CKD stage IIIb.   Baseline creatinine around 1.3-1.4.  BUN normalized with little worsening of creatinine this morning, at 1.56 -Continue with normal saline. -Monitor renal function. -Avoid nephrotoxins.  Anemia.  Patient had an anemia panel done in November 2021 which was consistent with anemia of chronic disease with some iron deficiency. Hemoglobin decreased to 8.5 this morning without any obvious bleeding.  All cell lines decreased, most likely dilutional effect with IV fluid. -Start her on iron supplement -Continue to monitor. -Conceals below 7.  Psoriatic arthritis.  Most of her immunosuppressive therapy is being held after surgery.  Per patient plan was to restart next year. -Continue prednisone at 10 mg daily, patient normally takes 5 and 10 mg on alternate days.  Dirty urine/UTI/colonization.  Patient with multiple UA with pyuria, no urinary symptoms but do have leukocytosis.  History of E. coli with good sensitivity which were adequately treated before, might be colonization. -Follow-up urine culture -Hold antibiotics at this time.  Type 2 diabetes.  Listed in her chart.  A1c was 5.2 in November 2021.  She is not on any medication at home. -Continue to monitor.  History of bipolar disorder. -Continue home dose of Lamictal and Lexapro.  Hypertension.  Blood pressure within goal. -We will hold  home dose of Lasix due to AKI and dehydration. -Keep holding nadolol-can be restarted if needed.  History of diverticulitis, perforation and colovesical fistula s/p repair. Healing well.  No acute  concern   Objective: Vitals:   08/09/20 2135 08/09/20 2147 08/10/20 0121 08/10/20 0500  BP:  (!) 117/54 (!) 113/52 (!) 110/49  Pulse:  (!) 59 73 71  Resp:  20 14 16   Temp:  (!) 97.4 F (36.3 C) 99.3 F (37.4 C) 98.5 F (36.9 C)  TempSrc:    Oral  SpO2:  100% 91% 92%  Weight: 80.7 kg     Height: 5\' 2"  (1.575 m)       Intake/Output Summary (Last 24 hours) at 08/10/2020 1235 Last data filed at 08/10/2020 0500 Gross per 24 hour  Intake 1461.67 ml  Output 700 ml  Net 761.67 ml   Filed Weights   08/09/20 2135  Weight: 80.7 kg    Examination:  General exam: Well-developed, lethargic lady, appears calm and comfortable  Respiratory system: Clear to auscultation. Respiratory effort normal. Cardiovascular system: S1 & S2 heard, RRR. Gastrointestinal system: Soft, nontender, nondistended, bowel sounds positive. Central nervous system: Alert and oriented. No focal neurological deficits. Extremities: No edema, no cyanosis, pulses intact and symmetrical. Psychiatry: Judgement and insight appear normal.    DVT prophylaxis: Lovenox Code Status: Full Family Communication: Daughter was updated at bedside. Disposition Plan:  Status is: Inpatient  Remains inpatient appropriate because:Inpatient level of care appropriate due to severity of illness   Dispo: The patient is from: Home              Anticipated d/c is to: SNF              Anticipated d/c date is: 2 days              Patient currently is not medically stable to d/c.   Consultants:   None  Procedures:  Antimicrobials:   Data Reviewed: I have personally reviewed following labs and imaging studies  CBC: Recent Labs  Lab 08/09/20 1312 08/10/20 0652  WBC 16.6* 11.9*  NEUTROABS 13.7*  --   HGB 10.3* 8.5*  HCT 33.2* 27.6*  MCV 100.6* 100.7*  PLT 231 433   Basic Metabolic Panel: Recent Labs  Lab 08/09/20 1312 08/09/20 1729 08/10/20 0652  NA 135  --  140  K 3.5  --  3.9  CL 99  --  104  CO2 27  --   28  GLUCOSE 116*  --  86  BUN 23*  --  16  CREATININE 1.78* 1.49* 1.56*  CALCIUM 12.8*  --  11.9*  MG  --  2.0  --   PHOS  --  2.7  --    GFR: Estimated Creatinine Clearance: 37.7 mL/min (A) (by C-G formula based on SCr of 1.56 mg/dL (H)). Liver Function Tests: Recent Labs  Lab 08/09/20 1312 08/10/20 0652  AST 23 15  ALT 22 16  ALKPHOS 83 69  BILITOT 0.5 0.5  PROT 6.6 5.3*  ALBUMIN 3.3* 2.4*   No results for input(s): LIPASE, AMYLASE in the last 168 hours. No results for input(s): AMMONIA in the last 168 hours. Coagulation Profile: No results for input(s): INR, PROTIME in the last 168 hours. Cardiac Enzymes: Recent Labs  Lab 08/09/20 1710  CKTOTAL 18*   BNP (last 3 results) No results for input(s): PROBNP in the last 8760 hours. HbA1C: No results for input(s): HGBA1C in the last 72 hours. CBG: No  results for input(s): GLUCAP in the last 168 hours. Lipid Profile: No results for input(s): CHOL, HDL, LDLCALC, TRIG, CHOLHDL, LDLDIRECT in the last 72 hours. Thyroid Function Tests: Recent Labs    08/09/20 1729  TSH 1.144   Anemia Panel: No results for input(s): VITAMINB12, FOLATE, FERRITIN, TIBC, IRON, RETICCTPCT in the last 72 hours. Sepsis Labs: No results for input(s): PROCALCITON, LATICACIDVEN in the last 168 hours.  Recent Results (from the past 240 hour(s))  Resp Panel by RT-PCR (Flu A&B, Covid) Nasopharyngeal Swab     Status: None   Collection Time: 08/09/20  2:13 PM   Specimen: Nasopharyngeal Swab; Nasopharyngeal(NP) swabs in vial transport medium  Result Value Ref Range Status   SARS Coronavirus 2 by RT PCR NEGATIVE NEGATIVE Final    Comment: (NOTE) SARS-CoV-2 target nucleic acids are NOT DETECTED.  The SARS-CoV-2 RNA is generally detectable in upper respiratory specimens during the acute phase of infection. The lowest concentration of SARS-CoV-2 viral copies this assay can detect is 138 copies/mL. A negative result does not preclude  SARS-Cov-2 infection and should not be used as the sole basis for treatment or other patient management decisions. A negative result may occur with  improper specimen collection/handling, submission of specimen other than nasopharyngeal swab, presence of viral mutation(s) within the areas targeted by this assay, and inadequate number of viral copies(<138 copies/mL). A negative result must be combined with clinical observations, patient history, and epidemiological information. The expected result is Negative.  Fact Sheet for Patients:  EntrepreneurPulse.com.au  Fact Sheet for Healthcare Providers:  IncredibleEmployment.be  This test is no t yet approved or cleared by the [place] FDA and  has been authorized for detection and/or diagnosis of SARS-CoV-2 by FDA under an Emergency Use Authorization (EUA). This EUA will remain  in effect (meaning this test can be used) for the duration of the COVID-19 declaration under Section 564(b)(1) of the Act, 21 U.S.C.section 360bbb-3(b)(1), unless the authorization is terminated  or revoked sooner.       Influenza A by PCR NEGATIVE NEGATIVE Final   Influenza B by PCR NEGATIVE NEGATIVE Final    Comment: (NOTE) The Xpert Xpress SARS-CoV-2/FLU/RSV plus assay is intended as an aid in the diagnosis of influenza from Nasopharyngeal swab specimens and should not be used as a sole basis for treatment. Nasal washings and aspirates are unacceptable for Xpert Xpress SARS-CoV-2/FLU/RSV testing.  Fact Sheet for Patients: EntrepreneurPulse.com.au  Fact Sheet for Healthcare Providers: IncredibleEmployment.be  This test is not yet approved or cleared by the [place] FDA and has been authorized for detection and/or diagnosis of SARS-CoV-2 by FDA under an Emergency Use Authorization (EUA). This EUA will remain in effect (meaning this test can be used) for the duration of  the COVID-19 declaration under Section 564(b)(1) of the Act, 21 U.S.C. section 360bbb-3(b)(1), unless the authorization is terminated or revoked.  Performed at [facility], [place] [address] [place], [place] [address]      Radiology Studies: CT Head Wo Contrast  Result Date: 08/09/2020 CLINICAL DATA:  Increased weakness, recent abdominal surgery EXAM: CT HEAD WITHOUT CONTRAST TECHNIQUE: Contiguous axial images were obtained from the base of the skull through the vertex without intravenous contrast. COMPARISON:  07/19/2020 FINDINGS: Brain: There is no acute intracranial hemorrhage, mass effect, or edema. Gray-white differentiation is preserved. There is no extra-axial fluid collection. Ventricles and sulci are within normal limits in size and configuration. Vascular: No hyperdense vessel or unexpected calcification. Skull: Calvarium is unremarkable. Sinuses/Orbits: No  acute finding. Other: None. IMPRESSION: No acute intracranial abnormality. Electronically Signed   By: Macy Mis M.D.   On: 08/09/2020 15:05   DG Chest Portable 1 View  Result Date: 08/09/2020 CLINICAL DATA:  Weakness EXAM: PORTABLE CHEST 1 VIEW COMPARISON:  None. FINDINGS: Stable cardiomediastinal contours with top-normal heart size. No new consolidation or edema. Mild chronic interstitial prominence. No pleural effusion or pneumothorax. IMPRESSION: No acute process in the chest. Electronically Signed   By: Macy Mis M.D.   On: 08/09/2020 14:51    Scheduled Meds: . ALPRAZolam  1 mg Oral BID  . vitamin C  1,000 mg Oral QHS  . aspirin EC  81 mg Oral Daily  . calcitonin (salmon)  1 spray Alternating Nares Once  . donepezil  10 mg Oral Daily  . enoxaparin (LOVENOX) injection  40 mg Subcutaneous Q24H  . escitalopram  30 mg Oral Daily  . lamoTRIgine  150 mg Oral BID  . pantoprazole  40 mg Oral Daily  . polyethylene glycol  17 g Oral Daily  . predniSONE  10 mg Oral Q breakfast  . rosuvastatin  5 mg  Oral q1800  . sodium chloride flush  10-40 mL Intracatheter Q12H  . sodium chloride flush  3 mL Intravenous Q12H  . zoledronic acid  5 mg Intravenous Once   Continuous Infusions: . sodium chloride       LOS: 0 days   Time spent: 35 minutes.  Lorella Nimrod, MD [facility]  If 7PM-7AM, please contact night-coverage [email or web address]  08/10/2020, 12:35 PM   This record has been created using Systems analyst. Errors have been sought and corrected,but may not always be located. Such creation errors do not reflect on the standard of care.

## 2020-08-10 NOTE — Evaluation (Signed)
Occupational Therapy Evaluation Patient Details Name: Valerie [name] MRN: [id number] DOB: [birth year] Today's Date: 08/10/2020    History of Present Illness 59 y.o. 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 Dx of hypercalcemia, AKI.   Clinical Impression   [name] is a 59 year old woman who presents with generalized weakness, decreased activity tolerance, impaired balance and increased somnolence resulting in significant decline in functional abilities and falls. Per patient she did improve at rehab but had increasing sleepiness the last week and a sudden decline in strength when she got home. On evaluation patient min assist to transfer to side of bed, min assist to stand and perform pivot transfers. With attempts at more than 2-3 steps patient's knees buckling. Patient set up for UB ADLs and max-total assist for lower body dressing and toileting. Patient will benefit from skilled OT services while in hospital to improve deficits and learn compensatory strategies as needed in order to improve mobility and ADLs. Therapist recommendation is for patient to return to short term rehab at discharge. If patient unable to go to SNF at discharge she will need maximized [facility] services and 24/7 assistance at home.       Follow Up Recommendations  SNF;Supervision/Assistance - 24 hour;Home health OT    Equipment Recommendations  None recommended by OT    Recommendations for Other Services       Precautions / Restrictions Precautions Precautions: Fall Precaution Comments: 3 falls in week prior to admission, pt/daughter report multiple  falls in past 1 year Restrictions Weight Bearing Restrictions: No      Mobility Bed Mobility Overal bed mobility: Needs Assistance Bed Mobility: Supine to Sit     Supine to sit: Min assist;HOB elevated     General bed mobility comments: Increased time, use of bed rails and hand hold to pull herself into sitting. Needed assistance to scoot forward    Transfers Overall transfer level: Needs assistance Equipment used: Rolling walker (2 wheeled) Transfers: Sit to/from [facility] Sit to Stand: Min assist;From elevated surface Stand pivot transfers: Min assist       General transfer comment: Min assist to stand with walker from elevated bed height. Only able to pivot due to knees buckling with any significant time in standing - also reports pain in knees.    Balance Overall balance assessment: Needs assistance Sitting-balance support: Feet supported;No upper extremity supported Sitting balance-Leahy Scale: Fair     Standing balance support: Bilateral upper extremity supported;During functional activity Standing balance-Leahy Scale: Poor Standing balance comment: Reliance on BUE on RW                           ADL either performed or assessed with clinical judgement   ADL Overall ADL's : Needs assistance/impaired Eating/Feeding: Set up   Grooming: Set up;Sitting   Upper Body Bathing: Set up;Sitting   Lower Body Bathing: Sit to/from stand;Maximal assistance   Upper Body Dressing : Set up;Sitting   Lower Body Dressing: Total assistance;Sit to/from stand Lower Body Dressing Details (indicate cue type and reason): Unable to assist with LB dressing - hands needing to be on walker Toilet Transfer: Minimal assistance;Stand-pivot;BSC;RW  Toileting- Clothing Manipulation and Hygiene: Maximal assistance;Sit to/from stand               Vision Patient Visual Report: No change from baseline       Perception     Praxis      Pertinent  Vitals/Pain Pain Assessment: Faces Pain Score: 5  Faces Pain Scale: Hurts a little bit Pain Location: generalized Pain Descriptors / Indicators: Sore Pain Intervention(s): Monitored during session     Hand Dominance Right   Extremity/Trunk Assessment Upper Extremity Assessment Upper Extremity Assessment: Generalized weakness;RUE deficits/detail;LUE deficits/detail RUE Deficits / Details: Shoulder 4-/5, elbow 4-/5, wrist 5/5, grip 4-/5 RUE Sensation: WNL RUE Coordination: WNL (able to perform finger to thumb and finger to nose without significant difficulty. Fatigued quickly and started missing the target.) LUE Deficits / Details: Shoulder 3+/5, elbow 3+/5, wrist 4/5, grip 3+/5 LUE Sensation: WNL LUE Coordination: WNL (able to perform finger to thumb and finger to nose without significant difficulty. Fatigued quickly and started missing the target.)   Lower Extremity Assessment Lower Extremity Assessment: Defer to PT evaluation   Cervical / Trunk Assessment Cervical / Trunk Assessment: Kyphotic   Communication Communication Communication: No difficulties   Cognition Arousal/Alertness: Awake/alert Behavior During Therapy: WFL for tasks assessed/performed Overall Cognitive Status: Within Functional Limits for tasks assessed                                     General Comments       Exercises     Shoulder Instructions      Home Living Family/patient expects to be discharged to:: Private residence Living Arrangements: Non-relatives/Friends (roommate) Available Help at Discharge: Other (Comment) (has a roommate) Type of Home: Other(Comment) (condo) Home Access: Stairs to enter Entrance Stairs-Number of Steps: 1   Home Layout: Two level;Able to live on main level with bedroom/bathroom Alternate Level Stairs-Number of Steps: 1 flight   Bathroom Shower/Tub: Other (comment) (sponge bathes)   Bathroom Toilet: Handicapped height     Home Equipment: Walker  - 2 wheels;Cane - single point;Wheelchair - manual;Bedside commode;Grab bars - toilet   Additional Comments: per daughter they placed temporary bed downstairs as pt has been unable to get upstairs for 2-3 mos      Prior Functioning/Environment Level of Independence: Needs assistance  Gait / Transfers Assistance Needed: Prior to recent hospitalization and SNF stay: supervision with ambulation + RW, uses w/c in community. reports multiple falls, roommate calls family when pt falls to get her up. Was walking about 67' at SNF. ADL's / Homemaking Assistance Needed: Able to perform UB ADLs, needs assistance with LB ADLs, was able to get up to [facility]   Comments: DTR, patient's sister or roommate provides assist for ADL/IADLs        OT Problem List: Decreased strength;Decreased activity tolerance;Impaired balance (sitting and/or standing);Decreased cognition;Decreased safety awareness;Obesity;Pain      OT Treatment/Interventions: Self-care/ADL training;Therapeutic exercise;Energy conservation;DME and/or AE instruction;Therapeutic activities;Patient/family education;Balance training    OT Goals(Current goals can be found in the care plan section) Acute Rehab OT Goals Patient Stated Goal: to get stronger OT Goal Formulation: With patient/family Time For Goal Achievement: 08/24/20 Potential to Achieve Goals: Good  OT Frequency: Min 2X/week   Barriers to D/C:            Co-evaluation              AM-PAC OT "6 Clicks" Daily  Activity     Outcome Measure Help from another person eating meals?: A Little Help from another person taking care of personal grooming?: A Little Help from another person toileting, which includes using toliet, bedpan, or urinal?: A Lot Help from another person bathing (including washing, rinsing, drying)?: A Lot Help from another person to put on and taking off regular upper body clothing?: A Little Help from another person to put on and taking off regular lower  body clothing?: Total 6 Click Score: 14   End of Session Equipment Utilized During Treatment: Gait belt;Rolling walker Nurse Communication: Mobility status  Activity Tolerance: Patient tolerated treatment well Patient left: in chair;with call bell/phone within reach;with chair alarm set  OT Visit Diagnosis: Unsteadiness on feet (R26.81);Other abnormalities of gait and mobility (R26.89);Muscle weakness (generalized) (M62.81);Pain Pain - part of body:  (generalized complaints of pain)                Time: 5449-2010 OT Time Calculation (min): 28 min Charges:  OT General Charges $OT Visit: 1 Visit OT Evaluation $OT Eval Moderate Complexity: 1 Mod OT Treatments $Self Care/Home Management : 8-22 mins  Glessie Eustice, OTR/L [place] [phone] Pager: [phone]   Lenward Chancellor 08/10/2020, 1:00 PM

## 2020-08-10 NOTE — TOC Initial Note (Addendum)
Transition of Care [facility]) - Initial/Assessment Note    Patient Details  Name: Valerie [name] MRN: [id number] Date of Birth: [birth year]  Transition of Care [facility]) CM/SW Contact:    Lennart Pall, LCSW Phone Number: 08/10/2020, 9:58 AM  Clinical Narrative:                 Met with pt and daughter this morning to discuss rehospitalization following recent SNF discharge home.  Both confirm pt was not managing well at home with poor mobility and unable to perform ADLs safely.  Pt very frustrated by this but realistic that a return to SNF rehab will likely be needed - pt and daughter agreeable with this plan.  Will await therapy evaluations and will need to begin ins authorization once these have been done.  Tentative plan at this point is SNF placement.  Pt is adamant that she does NOT wish to return to [facility].  Insurance Auth begun with ref # [id number]  Expected Discharge Plan: Skilled Nursing Facility Barriers to Discharge: Continued Medical Work up   Patient Goals and CMS Choice Patient states their goals for this hospitalization and ongoing recovery are:: return to SNF rehab      Expected Discharge Plan and Services Expected Discharge Plan: [place] In-house Referral: Clinical Social Work     Living arrangements for the past 2 months: [place] (Pt recently at SNF x 2 weeks then home alone x 1 week with rehospitalization)                                      Prior Living Arrangements/Services Living arrangements for the past 2 months: [place] (Pt recently at SNF x 2 weeks then home alone x 1 week with rehospitalization)   Patient language and need for interpreter reviewed:: Yes Do you feel safe going back to the place where you live?: No   does not have needed assistance at home  - requests rehab  Need for Family Participation in Patient Care: Yes (Comment) Care giver support  system in place?: No (comment)   Criminal Activity/Legal Involvement Pertinent to Current Situation/Hospitalization: No - Comment as needed  Activities of Daily Living Home Assistive Devices/Equipment: Eyeglasses,CBG Meter,CPAP,Blood pressure cuff,Cane (specify quad or straight),Walker (specify type),Wheelchair (Straight cane, walker with seat and no seat) ADL Screening (condition at time of admission) Patient's cognitive ability adequate to safely complete daily activities?: Yes Is the patient deaf or have difficulty hearing?: No Does the patient have difficulty seeing, even when wearing glasses/contacts?: Yes Does the patient have difficulty concentrating, remembering, or making decisions?: Yes (Memory issues since stroke) Patient able to express need for assistance with ADLs?: Yes Does the patient have difficulty dressing or bathing?: No Independently performs ADLs?: No Communication: Needs assistance Is this a change from baseline?: Pre-admission baseline Dressing (OT): Needs assistance Is this a change from baseline?: Pre-admission baseline Grooming: Independent Feeding: Independent Bathing: Needs assistance Is this a change from baseline?: Pre-admission baseline Toileting: Needs assistance Is this a change from baseline?: Pre-admission baseline In/Out Bed: Needs assistance Is this a change from baseline?: Pre-admission baseline Walks in Home: Dependent Is this a change from baseline?: Pre-admission baseline Does the patient have difficulty walking or climbing stairs?: Yes Weakness of Legs: Both Weakness of Arms/Hands: Both  Permission Sought/Granted Permission sought to share information with : Family Supports Permission granted to share information  with : Yes, Verbal Permission Granted  Share Information with NAME: Suleyma Wafer  Permission granted to share info w AGENCY: SNFs  Permission granted to share info w Relationship: daughter  Permission granted to share info w  Contact Information: [phone]  Emotional Assessment Appearance:: Appears older than stated age Attitude/Demeanor/Rapport: Gracious Affect (typically observed): Accepting,Pleasant Orientation: : Oriented to Place,Oriented to Self,Oriented to  Time,Oriented to Situation Alcohol / Substance Use: Not Applicable Psych Involvement: No (comment)  Admission diagnosis:  Hypercalcemia [E83.52] Generalized weakness [R53.1] AKI (acute kidney injury) ([place]) [N17.9] Patient Active Problem List   Diagnosis Date Noted  . Hypercalcemia 08/09/2020  . AKI (acute kidney injury) ([place])   . Hypoxemia 07/20/2020  . Altered mental status 07/19/2020  . Acute respiratory failure with hypoxemia ([place]) 07/19/2020  . Anemia, chronic disease 07/18/2020  . 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 Protein-calorie malnutrition, moderate ([place]) 06/05/2020  . Generalized weakness 06/04/2020  . CAD (coronary artery disease) 06/04/2020  . Macrocytic anemia 06/04/2020  . 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[place]) 05/20/2020  . Intra-abdominal infection   . NSTEMI (non-ST elevated myocardial infarction) ([place]) 11/14/2019  . DM type 2 (diabetes mellitus, type 2) ([place]) 11/14/2019  . Depression   . Anxiety   . Chest pain   . Obesity, Class III, BMI 40-49.9 (morbid obesity) ([place])   . CKD (chronic kidney disease), stage III ([place])   . Psoriatic arthritis ([place])   . Tobacco abuse   . OSA (obstructive sleep apnea)   . Neuropathy   . Bipolar disorder ([place])   . Osteoarthritis of right hip 11/05/2014  . Essential hypertension 10/29/2014   PCP:  Ophelia Shoulder, MD Pharmacy:   [facility] [place], [place] [place] [address] Phone: [phone] Fax: [phone]     Social Determinants of Health (SDOH) Interventions     Readmission Risk Interventions No flowsheet data found.

## 2020-08-10 NOTE — NC FL2 (Signed)
[place] MEDICAID FL2 LEVEL OF CARE SCREENING TOOL     IDENTIFICATION  Patient Name: Valerie [name] Birthdate: Jun 29, 1961 Sex: female Admission Date (Current Location): 08/09/2020  [facility] and [place] Number:  Herbalist and Address:  [facility],  [place], [place]      Provider Number: [id number]  Attending Physician Name and Address:  Lorella Nimrod, MD  Relative Name and Phone Number:  Rosia Syme [phone]    Current Level of Care: Hospital Recommended Level of Care: [place] Prior Approval Number:    Date Approved/Denied:   PASRR Number: [id number]  Discharge Plan: SNF    Current Diagnoses: Patient Active Problem List   Diagnosis Date Noted  . Hypercalcemia 08/09/2020  . AKI (acute kidney injury) ([place])   . Hypoxemia 07/20/2020  . Altered mental status 07/19/2020  . Acute respiratory failure with hypoxemia ([place]) 07/19/2020  . Anemia, chronic disease 07/18/2020  . 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 Protein-calorie malnutrition, moderate ([place]) 06/05/2020  . Generalized weakness 06/04/2020  . CAD (coronary artery disease) 06/04/2020  . Macrocytic anemia 06/04/2020  . 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[place]) 05/20/2020  . Intra-abdominal infection   . NSTEMI (non-ST elevated myocardial infarction) ([place]) 11/14/2019  . DM type 2 (diabetes mellitus, type 2) ([place]) 11/14/2019  . Depression   . Anxiety   . Chest pain   . Obesity, Class III, BMI 40-49.9 (morbid obesity) ([place])   . CKD (chronic kidney disease), stage III ([place])   . Psoriatic arthritis ([place])   . Tobacco abuse   . OSA (obstructive sleep apnea)   . Neuropathy   . Bipolar disorder ([place])   . Osteoarthritis of right hip 11/05/2014  . Essential hypertension 10/29/2014    Orientation RESPIRATION BLADDER Height & Weight      Self,Time,Situation,Place  Normal Continent,External catheter Weight: 177 lb 14.4 oz (80.7 kg) Height:  5\' 2"  (157.5 cm)  BEHAVIORAL SYMPTOMS/MOOD NEUROLOGICAL BOWEL NUTRITION STATUS      Continent    AMBULATORY STATUS COMMUNICATION OF NEEDS Skin   Extensive Assist Verbally Surgical wounds                       Personal Care Assistance Level of Assistance  Bathing,Feeding,Dressing Bathing Assistance: Limited assistance Feeding assistance: Independent Dressing Assistance: Limited assistance     Functional Limitations Info             SPECIAL CARE FACTORS FREQUENCY  PT (By licensed PT),OT (By licensed OT)     PT Frequency: 5x/wk OT Frequency: 5x/wk            Contractures Contractures Info: Not present    Additional Factors Info  Code Status,Allergies,Psychotropic Code Status Info: Full Allergies Info: see MAR Psychotropic Info: see MAR         Current Medications (08/10/2020):  This is the current hospital active medication list Current Facility-Administered Medications  Medication Dose Route Frequency Provider Last Rate Last Admin  . 0.9 %  sodium chloride infusion   Intravenous Continuous Lorella Nimrod, MD      . acetaminophen (TYLENOL) tablet 650 mg  650 mg Oral Q6H PRN Lorella Nimrod, MD   650 mg at 08/10/20 1057   Or  . acetaminophen (TYLENOL) suppository 650 mg  650 mg Rectal Q6H PRN Lorella Nimrod, MD      . ALPRAZolam Duanne Moron) tablet 1 mg  1 mg Oral BID Lorella Nimrod, MD   1 mg at 08/10/20 1058  . ascorbic acid (VITAMIN C) tablet 1,000 mg  1,000 mg Oral QHS Lorella Nimrod, MD      . aspirin EC tablet 81 mg  81 mg Oral Daily Lorella Nimrod, MD   81 mg at 08/10/20 1058  . calcitonin (salmon) (MIACALCIN/FORTICAL) nasal spray 1 spray  1 spray Alternating Nares Once Lorella Nimrod, MD      . donepezil (ARICEPT) tablet 10 mg  10 mg Oral Daily Lorella Nimrod, MD   10 mg at 08/10/20 1058  . enoxaparin (LOVENOX) injection 40 mg  40 mg Subcutaneous Q24H Lorella Nimrod, MD   40 mg at 08/09/20 2221  . escitalopram (LEXAPRO) tablet 30 mg  30 mg Oral Daily Lorella Nimrod, MD   30 mg at 08/10/20 1057  . ferrous sulfate tablet 325 mg  325 mg Oral BID WC Lorella Nimrod, MD      . lamoTRIgine (LAMICTAL) tablet 150 mg  150 mg Oral BID Lorella Nimrod, MD   150 mg at 08/10/20 1057  . ondansetron (ZOFRAN) tablet 4 mg  4 mg Oral Q6H PRN Lorella Nimrod, MD       Or  . ondansetron (ZOFRAN) injection 4 mg  4 mg Intravenous Q6H PRN Lorella Nimrod, MD      . pantoprazole (PROTONIX) EC tablet 40 mg  40 mg Oral Daily Lorella Nimrod, MD   40 mg at 08/10/20 1058  . polyethylene glycol (MIRALAX / GLYCOLAX) packet 17 g  17 g Oral Daily Lorella Nimrod, MD      . predniSONE (DELTASONE) tablet 10 mg  10 mg Oral Q breakfast Lorella Nimrod, MD   10 mg at 08/10/20 0856  . rosuvastatin (CRESTOR) tablet 5 mg  5 mg Oral q1800 Lorella Nimrod, MD   5 mg at 08/09/20 2136  . sodium chloride flush (NS) 0.9 % injection 10-40 mL  10-40 mL Intracatheter Q12H Lorella Nimrod, MD   10 mL at 08/10/20 0004  . sodium chloride flush (NS) 0.9 % injection 10-40 mL  10-40 mL Intracatheter PRN Lorella Nimrod, MD      . sodium chloride flush (NS) 0.9 % injection 3 mL  3 mL Intravenous Q12H Amin, Soundra Pilon, MD      . zolendronic acid (ZOMETA) 4 mg in sodium chloride 0.9 % 100 mL IVPB  4 mg Intravenous Once Lorella Nimrod, MD         Discharge Medications: Please see discharge summary for a list of discharge medications.  Relevant Imaging Results:  Relevant Lab Results:   Additional Information SSN [place]  Takeysha Bonk, LCSW

## 2020-08-10 NOTE — Evaluation (Signed)
Physical Therapy Evaluation Patient Details Name: Valerie [name] MRN: [id number] DOB: 12-07-1960 Today's Date: 08/10/2020   History of Present Illness  59 y.o. 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 Dx of hypercalcemia, AKI.  Clinical Impression  Pt admitted with above diagnosis. Pt reports she was able to ambulate about 43' with a RW at SNF, from which she DCed on 08/05/20. She reports 2-3 falls in the past 5 days. Today she was able to stand about 45 seconds with a RW and perform marching in place, tolerance limited by BLE fatigue and pain. ST-SNF recommended. Pt currently with functional limitations due to the deficits listed below (see PT Problem List). Pt will benefit from skilled PT to increase their independence and safety with mobility to allow discharge to the venue listed below.       Follow Up Recommendations SNF;Supervision for mobility/OOB;Supervision/Assistance - 24 hour    Equipment Recommendations  None recommended by PT    Recommendations for Other Services       Precautions / Restrictions Precautions Precautions: Fall Precaution Comments: 3 falls in week prior to admission, pt/daughter report multiple falls in past 1 year Restrictions Weight Bearing Restrictions: No      Mobility  Bed Mobility               General bed mobility comments: up in recliner    Transfers Overall transfer level: Needs assistance Equipment used: Rolling walker (2 wheeled) Transfers: Sit to/from Stand Sit to Stand: Mod assist         General transfer comment: Mod A to power up, pt stood and marched in place for  ~45 seconds, tolerance limited by fatigue  Ambulation/Gait             General Gait Details: unable  Stairs            Wheelchair Mobility    Modified Rankin (Stroke Patients Only)       Balance   Sitting-balance support: Feet supported;Bilateral upper extremity supported Sitting balance-Leahy Scale: Fair     Standing balance support: Bilateral upper extremity supported Standing balance-Leahy Scale: Poor Standing balance comment: Reliance on BUE on RW                             Pertinent Vitals/Pain Pain Score: 5  Pain Location: B knees Pain Descriptors / Indicators: Sore Pain Intervention(s): Limited activity within patient's tolerance;Monitored during session;Patient requesting pain meds-RN notified    Home Living Family/patient expects to be discharged to:: Private residence Living Arrangements: Non-relatives/Friends (roommate) Available Help at Discharge: Other (Comment) (has a roommate) Type of Home: Other(Comment) (condo) Home Access: Stairs to enter   Entrance Stairs-Number of Steps: 1 Home Layout: Two level;Able to live on main level with bedroom/bathroom Home Equipment: Gilford Rile - 2 wheels;Cane - single point;Wheelchair - manual;Bedside commode;Grab bars - toilet Additional Comments: per pt sister they placed temporary bed downstairs as pt has been unable to get upstairs for 2-3 mos    Prior Function Level of Independence: Needs assistance   Gait / Transfers Assistance Needed: Prior to recent hospitalization and SNF stay: supervision with ambulation + RW, uses w/c in community.  reports multiple falls, roommate calls family when pt falls to get her up. Was walking about 12' at SNF.  ADL's / Homemaking Assistance Needed: needs assist  Comments: DTR, patient's sister or roommate provides assist for ADL/IADLs     Hand Dominance        Extremity/Trunk Assessment   Upper Extremity Assessment Upper Extremity Assessment: Defer to OT  evaluation    Lower Extremity Assessment Lower Extremity Assessment: Generalized weakness (B knee ext -4/5, B ankle DF -4/5)    Cervical / Trunk Assessment Cervical / Trunk Assessment: Kyphotic  Communication   Communication: No difficulties  Cognition Arousal/Alertness: Awake/alert Behavior During Therapy: WFL for tasks assessed/performed Overall Cognitive Status: Within Functional Limits for tasks assessed                                        General Comments      Exercises     Assessment/Plan    PT Assessment Patient needs continued PT services  PT Problem List Decreased strength;Decreased activity tolerance;Decreased mobility;Decreased safety awareness;Decreased balance;Decreased knowledge of use of DME;Decreased cognition;Cardiopulmonary status limiting activity       PT Treatment Interventions DME instruction;Therapeutic exercise;Gait training;Functional mobility training;Therapeutic activities;Patient/family education;Balance training    PT Goals (Current goals can be found in the Care Plan section)  Acute Rehab PT Goals Patient Stated Goal: go to rehab PT Goal Formulation: With patient/family Time For Goal Achievement: 08/24/20 Potential to Achieve Goals: Good    Frequency Min 2X/week   Barriers to discharge        Co-evaluation               AM-PAC PT "6 Clicks" Mobility  Outcome Measure Help needed turning from your back to your side while in a flat bed without using bedrails?: A Little Help needed moving from lying on your back to sitting on the side of a flat bed without using bedrails?: A Lot Help needed moving to and from a bed to a chair (including a wheelchair)?: A Lot Help needed standing up from a chair using your arms (e.g., wheelchair or bedside chair)?: A Lot Help needed to walk in hospital room?: A Lot Help needed climbing 3-5 steps with a railing? : A Lot 6 Click Score: 13    End of Session Equipment Utilized  During Treatment: Gait belt Activity Tolerance: Patient tolerated treatment well Patient left: with call bell/phone within reach;in chair;with nursing/sitter in room;with family/visitor present;with chair alarm set Nurse Communication: Mobility status PT Visit Diagnosis: Unsteadiness on feet (R26.81)    Time: 1638-4665 PT Time Calculation (min) (ACUTE ONLY): 13 min   Charges:   PT Evaluation $PT Eval Low Complexity: 1 Low         Blondell Reveal Kistler PT 08/10/2020  Acute Rehabilitation Services Pager [phone] Office [phone]

## 2020-08-11 LAB — RENAL FUNCTION PANEL
Albumin: 2.4 g/dL — ABNORMAL LOW (ref 3.5–5.0)
Anion gap: 5 (ref 5–15)
BUN: 16 mg/dL (ref 6–20)
CO2: 28 mmol/L (ref 22–32)
Calcium: 11.7 mg/dL — ABNORMAL HIGH (ref 8.9–10.3)
Chloride: 107 mmol/L (ref 98–111)
Creatinine, Ser: 1.55 mg/dL — ABNORMAL HIGH (ref 0.44–1.00)
GFR, Estimated: 38 mL/min — ABNORMAL LOW (ref >60)
Glucose, Bld: 86 mg/dL (ref 70–99)
Phosphorus: 2.3 mg/dL — ABNORMAL LOW (ref 2.5–4.6)
Potassium: 4 mmol/L (ref 3.5–5.1)
Sodium: 140 mmol/L (ref 135–145)

## 2020-08-11 LAB — PTH, INTACT AND CALCIUM
Calcium, Total (PTH): 12 mg/dL — ABNORMAL HIGH (ref 8.7–10.3)
PTH: 73 pg/mL — ABNORMAL HIGH (ref 15–65)

## 2020-08-11 LAB — CBC
HCT: 28.6 % — ABNORMAL LOW (ref 36.0–46.0)
Hemoglobin: 8.7 g/dL — ABNORMAL LOW (ref 12.0–15.0)
MCH: 31 pg (ref 26.0–34.0)
MCHC: 30.4 g/dL (ref 30.0–36.0)
MCV: 101.8 fL — ABNORMAL HIGH (ref 80.0–100.0)
Platelets: 201 10*3/uL (ref 150–400)
RBC: 2.81 MIL/uL — ABNORMAL LOW (ref 3.87–5.11)
RDW: 14.1 % (ref 11.5–15.5)
WBC: 8.7 10*3/uL (ref 4.0–10.5)
nRBC: 0 % (ref 0.0–0.2)

## 2020-08-11 LAB — CALCITRIOL (1,25 DI-OH VIT D): Vit D, 1,25-Dihydroxy: 33.2 pg/mL (ref 19.9–79.3)

## 2020-08-11 MED ORDER — SODIUM CHLORIDE 0.9 % IV SOLN
1.0000 g | INTRAVENOUS | Status: DC
  Administered 2020-08-11: 1 g via INTRAVENOUS
  Filled 2020-08-11: qty 1
  Filled 2020-08-11: qty 10

## 2020-08-11 MED ORDER — SODIUM CHLORIDE 0.9 % IV SOLN: INTRAVENOUS | Status: AC

## 2020-08-11 NOTE — Plan of Care (Signed)

## 2020-08-11 NOTE — Progress Notes (Signed)
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  After surgery she was discharged to skilled nursing facility for rehab.  She came back on Monday with complaints of inability to take care of herself with persistent generalized weakness and recurrent falls.  She was found to have hypercalcemia, corrected calcium 13.4.   Subjective   Patient seen and examined, continues to have generalized weakness.  Serum calcium is still elevated at 13.0.   Assessment/Plan:     1. Hypercalcemia-patient presented with generalized weakness and increased somnolence.  Serum calcium was elevated to 13.4, started on IV normal saline.  TSH with normal limits, cortisol 7.8.  Vitamin D level normal at 68.  PTH is mildly elevated at 73, patient received Zometa 4 mg 1 dose, along with Miacalcin nasal spray.  Serum calcium is still elevated at 13.0.  Will order 24-hour urinary calcium to distinguish mild hyperparathyroidism with [place].  Continue IV normal saline at 125 mL/h. 2. Constipation-secondary to hypercalcemia, started on bowel regimen  3. Frequent falls-patient was found on floor more than twice in the hospital.  CK within normal.  PT recommended skilled nursing facility placement. 4. Acute kidney injury with CKD stage IIIb-baseline creatinine around 1.3-1.4, creatinine is elevated at 1.55.  Continue with normal saline.  Follow BMP in am. 5. Chronic anemia-anemia panel in November 21 showed findings consistent with iron deficiency anemia.  Continue iron supplementation. 6. Psoriatic arthritis-most ofher therapy is being held after surgery.  Continue prednisone 10 mg daily 7. UTI-urine  culture growing E. coli, will start ceftriaxone.  Follow culture sensitivity results. 8. Diabetes mellitus type 2-continue sliding scale insulin with NovoLog. 9. History bipolar disorder-continue Lamictal, Lexapro. 10. Hypertension-blood pressure is stable, 11. History of diverticulitis, perforation and colovesical fistula s/p repair-healing well.     COVID-19 Labs  No results for input(s): DDIMER, FERRITIN, LDH, CRP in the last 72 hours.  Lab Results  Component Value Date   SARSCOV2NAA NEGATIVE 08/09/2020   [place] NEGATIVE 07/23/2020   [employer] [employer] NEGATIVE 07/13/2020   [place] NEGATIVE 06/04/2020     Scheduled medications:   . ALPRAZolam  1 mg Oral BID  . vitamin C  1,000 mg Oral QHS  . aspirin EC  81 mg Oral Daily  . donepezil  10 mg Oral Daily  . enoxaparin (LOVENOX) injection  40 mg Subcutaneous Q24H  . escitalopram  30 mg Oral Daily  . ferrous sulfate  325 mg Oral BID WC  . lamoTRIgine  150 mg Oral BID  . pantoprazole  40 mg Oral Daily  . polyethylene glycol  17 g Oral Daily  . predniSONE  10 mg Oral Q breakfast  . rosuvastatin  5 mg Oral q1800  . sodium chloride flush  10-40 mL Intracatheter Q12H  . sodium chloride flush  3 mL Intravenous Q12H         CBG: No results for input(s): GLUCAP in the last 168 hours.  SpO2: 93 %    CBC: Recent Labs  Lab 08/09/20 1312 08/10/20 0652 08/11/20 0438  WBC 16.6* 11.9* 8.7  NEUTROABS 13.7*  --   --   HGB 10.3* 8.5* 8.7*  HCT 33.2* 27.6* 28.6*  MCV 100.6* 100.7* 101.8*  PLT 231 200 201  Basic Metabolic Panel: Recent Labs  Lab 08/09/20 1312 08/09/20 1729 08/10/20 0652 08/11/20 0438  NA 135  --  140 140  K 3.5  --  3.9 4.0  CL 99  --  104 107  CO2 27  --  28 28  GLUCOSE 116*  --  86 86  BUN 23*  --  16 16  CREATININE 1.78* 1.49* 1.56* 1.55*  CALCIUM 12.8* 12.0* 11.9* 11.7*  MG  --  2.0  --   --   PHOS  --  2.7  --  2.3*     Liver Function Tests: Recent Labs  Lab 08/09/20 1312  08/10/20 0652 08/11/20 0438  AST 23 15  --   ALT 22 16  --   ALKPHOS 83 69  --   BILITOT 0.5 0.5  --   PROT 6.6 5.3*  --   ALBUMIN 3.3* 2.4* 2.4*     Antibiotics: Anti-infectives (From admission, onward)   None       DVT prophylaxis: Lovenox  Code Status: Full code  Family Communication: No family at bedside Yes    Objective   Vitals:   08/10/20 0500 08/10/20 1349 08/10/20 2108 08/11/20 0419  BP: (!) 110/49 (!) 107/49 (!) 105/52 (!) 121/56  Pulse: 71 65 61 (!) 56  Resp: 16 18 17 18   Temp: 98.5 F (36.9 C) 98.4 F (36.9 C) 98.4 F (36.9 C) 97.6 F (36.4 C)  TempSrc: Oral Oral Oral Oral  SpO2: 92% (!) 88% 91% 93%  Weight:      Height:        Intake/Output Summary (Last 24 hours) at 08/11/2020 1540 Last data filed at 08/11/2020 1400 Gross per 24 hour  Intake 2436.2 ml  Output 1300 ml  Net 1136.2 ml    12/17 1901 - 12/19 0700 In: 3255.6 [P.O.:780; I.V.:2370.6] Out: 2150 [Urine:2150]  Filed Weights   08/09/20 2135  Weight: 80.7 kg    Physical Examination:    General-appears in no acute distress  Heart-S1-S2, regular, no murmur auscultated  Lungs-clear to auscultation bilaterally, no wheezing or crackles auscultated  Abdomen-soft, nontender, no organomegaly  Extremities-no edema in the lower extremities  Neuro-alert, oriented x3, no focal deficit noted   Status is: Inpatient  Dispo: The patient is from: Home              Anticipated d/c is to: Home              Anticipated d/c date is: 08/15/2020              Patient currently not medically stable for discharge  Barrier to discharge-ongoing treatment for hypercalcemia       Data Reviewed:   Recent Results (from the past 240 hour(s))  Culture, Urine     Status: Abnormal (Preliminary result)   Collection Time: 08/09/20 12:37 PM   Specimen: Urine, Clean Catch  Result Value Ref Range Status   Specimen Description   Final    URINE, CLEAN CATCH Performed at [facility], [place] [address] [place], [place] [address]    Special Requests   Final    NONE Performed at [facility], [place] [address], [place], [place] [address]    Culture (A)  Final    >=100,000 COLONIES/mL ESCHERICHIA COLI SUSCEPTIBILITIES TO FOLLOW Performed at [facility], [place] [address], [place],  [address]    Report Status PENDING  Incomplete  Resp Panel by RT-PCR (Flu A&B, Covid)  Nasopharyngeal Swab     Status: None   Collection Time: 08/09/20  2:13 PM   Specimen: Nasopharyngeal Swab; Nasopharyngeal(NP) swabs in vial transport medium  Result Value Ref Range Status   SARS Coronavirus 2 by RT PCR NEGATIVE NEGATIVE Final    Comment: (NOTE) SARS-CoV-2 target nucleic acids are NOT DETECTED.  The SARS-CoV-2 RNA is generally detectable in upper respiratory specimens during the acute phase of infection. The lowest concentration of SARS-CoV-2 viral copies this assay can detect is 138 copies/mL. A negative result does not preclude SARS-Cov-2 infection and should not be used as the sole basis for treatment or other patient management decisions. A negative result may occur with  improper specimen collection/handling, submission of specimen other than nasopharyngeal swab, presence of viral mutation(s) within the areas targeted by this assay, and inadequate number of viral copies(<138 copies/mL). A negative result must be combined with clinical observations, patient history, and epidemiological information. The expected result is Negative.  Fact Sheet for Patients:  EntrepreneurPulse.com.au  Fact Sheet for Healthcare Providers:  IncredibleEmployment.be  This test is no t yet approved or cleared by the [place] FDA and  has been authorized for detection and/or diagnosis of SARS-CoV-2 by FDA under an Emergency Use Authorization (EUA). This EUA will remain  in effect (meaning this test can be used) for  the duration of the COVID-19 declaration under Section 564(b)(1) of the Act, 21 U.S.C.section 360bbb-3(b)(1), unless the authorization is terminated  or revoked sooner.       Influenza A by PCR NEGATIVE NEGATIVE Final   Influenza B by PCR NEGATIVE NEGATIVE Final    Comment: (NOTE) The Xpert Xpress SARS-CoV-2/FLU/RSV plus assay is intended as an aid in the diagnosis of influenza from Nasopharyngeal swab specimens and should not be used as a sole basis for treatment. Nasal washings and aspirates are unacceptable for Xpert Xpress SARS-CoV-2/FLU/RSV testing.  Fact Sheet for Patients: EntrepreneurPulse.com.au  Fact Sheet for Healthcare Providers: IncredibleEmployment.be  This test is not yet approved or cleared by the [place] FDA and has been authorized for detection and/or diagnosis of SARS-CoV-2 by FDA under an Emergency Use Authorization (EUA). This EUA will remain in effect (meaning this test can be used) for the duration of the COVID-19 declaration under Section 564(b)(1) of the Act, 21 U.S.C. section 360bbb-3(b)(1), unless the authorization is terminated or revoked.  Performed at [facility], [place] [address], [place], [place] [address]      Cardiac Enzymes: Recent Labs  Lab 08/09/20 1710  CKTOTAL [place]   [facility] If 7PM-7AM, please contact night-coverage at [email or web address], Office  [phone]   08/11/2020, 3:40 PM  LOS: 1 day

## 2020-08-12 LAB — RENAL FUNCTION PANEL
Albumin: 2.5 g/dL — ABNORMAL LOW (ref 3.5–5.0)
Anion gap: 7 (ref 5–15)
BUN: 13 mg/dL (ref 6–20)
CO2: 26 mmol/L (ref 22–32)
Calcium: 10.3 mg/dL (ref 8.9–10.3)
Chloride: 105 mmol/L (ref 98–111)
Creatinine, Ser: 1.43 mg/dL — ABNORMAL HIGH (ref 0.44–1.00)
GFR, Estimated: 42 mL/min — ABNORMAL LOW (ref >60)
Glucose, Bld: 85 mg/dL (ref 70–99)
Phosphorus: 1.1 mg/dL — ABNORMAL LOW (ref 2.5–4.6)
Potassium: 3.9 mmol/L (ref 3.5–5.1)
Sodium: 138 mmol/L (ref 135–145)

## 2020-08-12 LAB — URINE CULTURE: Culture: >=100000 — AB

## 2020-08-12 LAB — CALCIUM / CREATININE RATIO, URINE
Calcium, Ur: 6 mg/dL
Calcium/Creat.Ratio: 154 mg/g creat (ref 29–442)
Creatinine, Urine: 39 mg/dL

## 2020-08-12 MED ORDER — SODIUM CHLORIDE 0.9 % IV SOLN: INTRAVENOUS | Status: DC

## 2020-08-12 MED ORDER — CEPHALEXIN 500 MG PO CAPS
500.0000 mg | ORAL_CAPSULE | Freq: Two times a day (BID) | ORAL | Status: DC
  Administered 2020-08-12 – 2020-08-15 (×6): 500 mg via ORAL
  Filled 2020-08-12 (×7): qty 1

## 2020-08-12 NOTE — Progress Notes (Signed)
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  After surgery she was discharged to skilled nursing facility for rehab.  She came back on Monday with complaints of inability to take care of herself with persistent generalized weakness and recurrent falls.  She was found to have hypercalcemia, corrected calcium 13.4.   Subjective   Patient seen and examined, denies any complaints.  Serum calcium is down to 11.5.   Assessment/Plan:     1. Hypercalcemia-patient presented with generalized weakness and increased somnolence.  Serum calcium was elevated to 13.4, started on IV normal saline.  TSH with normal limits, cortisol 7.8.  Vitamin D level normal at 68.  PTH is mildly elevated at 73, patient received Zometa 4 mg 1 dose, along with Miacalcin nasal spray.  Serum calcium is down to 11.5.   24-hour urinary calcium has been ordered to distinguish mild hyperparathyroidism with [place].  Continue IV normal saline at 125 mL/h. 2. Constipation-secondary to hypercalcemia, started on bowel regimen  3. Frequent falls-patient was found on floor more than twice in the hospital.  CK within normal.  PT recommended skilled nursing facility placement. 4. Acute kidney injury with CKD stage IIIb-baseline creatinine around 1.3-1.4, creatinine is down to 1.43.  Continue with normal saline.  Follow BMP in am. 5. Chronic anemia-anemia panel in November 21 showed findings consistent with iron deficiency anemia.  Continue iron supplementation. 6. Psoriatic arthritis-most ofher therapy is being held after surgery.  Continue prednisone 10 mg daily 7. UTI-urine culture growing E. coli, continue  ceftriaxone.  Follow culture sensitivity results. 8. Diabetes mellitus type 2-continue sliding scale insulin with NovoLog. 9. History bipolar disorder-continue Lamictal, Lexapro. 10. Hypertension-blood pressure is stable, 11. History of diverticulitis, perforation and colovesical fistula s/p repair-healing well.     COVID-19 Labs  No results for input(s): DDIMER, FERRITIN, LDH, CRP in the last 72 hours.  Lab Results  Component Value Date   SARSCOV2NAA NEGATIVE 08/09/2020   [place] NEGATIVE 07/23/2020   [place] NEGATIVE 07/13/2020   [place] NEGATIVE 06/04/2020     Scheduled medications:   . ALPRAZolam  1 mg Oral BID  . vitamin C  1,000 mg Oral QHS  . aspirin EC  81 mg Oral Daily  . donepezil  10 mg Oral Daily  . enoxaparin (LOVENOX) injection  40 mg Subcutaneous Q24H  . escitalopram  30 mg Oral Daily  . ferrous sulfate  325 mg Oral BID WC  . lamoTRIgine  150 mg Oral BID  . pantoprazole  40 mg Oral Daily  . polyethylene glycol  17 g Oral Daily  . predniSONE  10 mg Oral Q breakfast  . rosuvastatin  5 mg Oral q1800  . sodium chloride flush  10-40 mL Intracatheter Q12H  . sodium chloride flush  3 mL Intravenous Q12H         CBG: No results for input(s): GLUCAP in the last 168 hours.  SpO2: 95 %    CBC: Recent Labs  Lab 08/09/20 1312 08/10/20 0652 08/11/20 0438  WBC 16.6* 11.9* 8.7  NEUTROABS 13.7*  --   --   HGB 10.3* 8.5* 8.7*  HCT 33.2* 27.6* 28.6*  MCV 100.6* 100.7* 101.8*  PLT 231 200 201    Basic  Metabolic Panel: Recent Labs  Lab 08/09/20 1312 08/09/20 1729 08/10/20 0652 08/11/20 0438 08/12/20 0428  NA 135  --  140 140 138  K 3.5  --  3.9 4.0 3.9  CL 99  --  104 107 105  CO2 27  --  28 28 26   GLUCOSE 116*  --  86 86 85  BUN 23*  --  16 16 13   CREATININE 1.78* 1.49* 1.56* 1.55* 1.43*  CALCIUM 12.8* 12.0* 11.9* 11.7* 10.3  MG  --  2.0  --   --   --   PHOS  --  2.7  --  2.3* 1.1*     Liver Function Tests: Recent Labs  Lab  08/09/20 1312 08/10/20 0652 08/11/20 0438 08/12/20 0428  AST 23 15  --   --   ALT 22 16  --   --   ALKPHOS 83 69  --   --   BILITOT 0.5 0.5  --   --   PROT 6.6 5.3*  --   --   ALBUMIN 3.3* 2.4* 2.4* 2.5*     Antibiotics: Anti-infectives (From admission, onward)   Start     Dose/Rate Route Frequency Ordered Stop   08/11/20 1730  cefTRIAXone (ROCEPHIN) 1 g in sodium chloride 0.9 % 100 mL IVPB        1 g 200 mL/hr over 30 Minutes Intravenous Every 24 hours 08/11/20 1631         DVT prophylaxis: Lovenox  Code Status: Full code  Family Communication: No family at bedside Yes    Objective   Vitals:   08/10/20 2108 08/11/20 0419 08/11/20 2050 08/12/20 0454  BP: (!) 105/52 (!) 121/56 119/73 131/67  Pulse: 61 (!) 56 68 74  Resp: 17 18 17 17   Temp: 98.4 F (36.9 C) 97.6 F (36.4 C) 98.8 F (37.1 C) 98.3 F (36.8 C)  TempSrc: Oral Oral Oral   SpO2: 91% 93% 94% 95%  Weight:      Height:        Intake/Output Summary (Last 24 hours) at 08/12/2020 1040 Last data filed at 08/12/2020 1000 Gross per 24 hour  Intake 2606.75 ml  Output 1250 ml  Net 1356.75 ml    12/18 1901 - 12/20 0700 In: 4515.1 [P.O.:540; I.V.:3875.1] Out: 2200 [Urine:2200]  Filed Weights   08/09/20 2135  Weight: 80.7 kg    Physical Examination:    General-appears in no acute distress  Heart-S1-S2, regular, no murmur auscultated  Lungs-clear to auscultation bilaterally, no wheezing or crackles auscultated  Abdomen-soft, nontender, no organomegaly  Extremities-no edema in the lower extremities  Neuro-alert, oriented x3, no focal deficit noted   Status is: Inpatient  Dispo: The patient is from: Home              Anticipated d/c is to: Home              Anticipated d/c date is: 08/15/2020              Patient currently not medically stable for discharge  Barrier to discharge-ongoing treatment for hypercalcemia       Data Reviewed:   Recent Results (from the past 240  hour(s))  Culture, Urine     Status: Abnormal   Collection Time: 08/09/20 12:37 PM   Specimen: Urine, Clean Catch  Result Value Ref Range Status   Specimen Description   Final    URINE, CLEAN CATCH Performed at [facility] [address]  [place], [place] [address]    Special Requests   Final    NONE Performed at [facility], [place] [address], [place], [place] [address]    Culture >=100,000 COLONIES/mL ESCHERICHIA COLI (A)  Final   Report Status 08/12/2020 FINAL  Final   Organism ID, Bacteria ESCHERICHIA COLI (A)  Final      Susceptibility   Escherichia coli - MIC*    AMPICILLIN >=32 RESISTANT Resistant     CEFAZOLIN <=4 SENSITIVE Sensitive     CEFEPIME <=0.12 SENSITIVE Sensitive     CEFTRIAXONE <=0.25 SENSITIVE Sensitive     CIPROFLOXACIN 0.5 SENSITIVE Sensitive     GENTAMICIN <=1 SENSITIVE Sensitive     IMIPENEM <=0.25 SENSITIVE Sensitive     NITROFURANTOIN <=16 SENSITIVE Sensitive     TRIMETH/SULFA <=20 SENSITIVE Sensitive     AMPICILLIN/SULBACTAM >=32 RESISTANT Resistant     PIP/TAZO <=4 SENSITIVE Sensitive     * >=100,000 COLONIES/mL ESCHERICHIA COLI  Resp Panel by RT-PCR (Flu A&B, Covid) Nasopharyngeal Swab     Status: None   Collection Time: 08/09/20  2:13 PM   Specimen: Nasopharyngeal Swab; Nasopharyngeal(NP) swabs in vial transport medium  Result Value Ref Range Status   SARS Coronavirus 2 by RT PCR NEGATIVE NEGATIVE Final    Comment: (NOTE) SARS-CoV-2 target nucleic acids are NOT DETECTED.  The SARS-CoV-2 RNA is generally detectable in upper respiratory specimens during the acute phase of infection. The lowest concentration of SARS-CoV-2 viral copies this assay can detect is 138 copies/mL. A negative result does not preclude SARS-Cov-2 infection and should not be used as the sole basis for treatment or other patient management decisions. A negative result may occur with  improper specimen collection/handling, submission of  specimen other than nasopharyngeal swab, presence of viral mutation(s) within the areas targeted by this assay, and inadequate number of viral copies(<138 copies/mL). A negative result must be combined with clinical observations, patient history, and epidemiological information. The expected result is Negative.  Fact Sheet for Patients:  EntrepreneurPulse.com.au  Fact Sheet for Healthcare Providers:  IncredibleEmployment.be  This test is no t yet approved or cleared by the [place] FDA and  has been authorized for detection and/or diagnosis of SARS-CoV-2 by FDA under an Emergency Use Authorization (EUA). This EUA will remain  in effect (meaning this test can be used) for the duration of the COVID-19 declaration under Section 564(b)(1) of the Act, 21 U.S.C.section 360bbb-3(b)(1), unless the authorization is terminated  or revoked sooner.       Influenza A by PCR NEGATIVE NEGATIVE Final   Influenza B by PCR NEGATIVE NEGATIVE Final    Comment: (NOTE) The Xpert Xpress SARS-CoV-2/FLU/RSV plus assay is intended as an aid in the diagnosis of influenza from Nasopharyngeal swab specimens and should not be used as a sole basis for treatment. Nasal washings and aspirates are unacceptable for Xpert Xpress SARS-CoV-2/FLU/RSV testing.  Fact Sheet for Patients: EntrepreneurPulse.com.au  Fact Sheet for Healthcare Providers: IncredibleEmployment.be  This test is not yet approved or cleared by the [place] FDA and has been authorized for detection and/or diagnosis of SARS-CoV-2 by FDA under an Emergency Use Authorization (EUA). This EUA will remain in effect (meaning this test can be used) for the duration of the COVID-19 declaration under Section 564(b)(1) of the Act, 21 U.S.C. section 360bbb-3(b)(1), unless the authorization is terminated or revoked.  Performed at [facility], [place]  [address] [place],  [address]      Cardiac Enzymes: Recent Labs  Lab 08/09/20 1710  CKTOTAL 18*    [place]   [facility] If 7PM-7AM, please contact night-coverage at [email or web address], Office  [phone]   08/12/2020, 10:40 AM  LOS: 2 days

## 2020-08-13 LAB — RENAL FUNCTION PANEL
Albumin: 2.5 g/dL — ABNORMAL LOW (ref 3.5–5.0)
Anion gap: 8 (ref 5–15)
BUN: 11 mg/dL (ref 6–20)
CO2: 23 mmol/L (ref 22–32)
Calcium: 9.5 mg/dL (ref 8.9–10.3)
Chloride: 110 mmol/L (ref 98–111)
Creatinine, Ser: 1.35 mg/dL — ABNORMAL HIGH (ref 0.44–1.00)
GFR, Estimated: 45 mL/min — ABNORMAL LOW (ref >60)
Glucose, Bld: 67 mg/dL — ABNORMAL LOW (ref 70–99)
Phosphorus: 1 mg/dL — CL (ref 2.5–4.6)
Potassium: 4 mmol/L (ref 3.5–5.1)
Sodium: 141 mmol/L (ref 135–145)

## 2020-08-13 MED ORDER — SODIUM CHLORIDE 0.9 % IV SOLN: INTRAVENOUS | Status: DC

## 2020-08-13 MED ORDER — SODIUM PHOSPHATES 45 MMOLE/15ML IV SOLN
30.0000 mmol | Freq: Once | INTRAVENOUS | Status: AC
  Administered 2020-08-13: 30 mmol via INTRAVENOUS
  Filled 2020-08-13: qty 10

## 2020-08-13 NOTE — Progress Notes (Signed)
CRITICAL VALUE ALERT  Critical Value:  Phosphorus 1.0  Date & Time Notied:  08/13/20 @0718   Provider Notified: [place]  Orders Received/Actions taken:None

## 2020-08-13 NOTE — Progress Notes (Addendum)
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  After surgery she was discharged to skilled nursing facility for rehab.  She came back on Monday with complaints of inability to take care of herself with persistent generalized weakness and recurrent falls.  She was found to have hypercalcemia, corrected calcium 13.4.   Subjective   Patient seen and examined, still complains of lethargy.  Phosphorus was 1.0 this morning.   Assessment/Plan:     1. Hypercalcemia-patient presented with generalized weakness and increased somnolence.  Serum calcium was elevated to 13.4, started on IV normal saline.  TSH with normal limits, cortisol 7.8.  Vitamin D level normal at 68.  PTH is mildly elevated at 73, patient received Zometa 4 mg 1 dose, along with Miacalcin nasal spray.  Corrected serum calcium is down to 10.7.    24-hour urinary calcium has been ordered to distinguish mild hyperparathyroidism with [place].  Will cut down normal saline to 75 mill per hour. 2. Constipation-secondary to hypercalcemia, started on bowel regimen  3. Hypophosphatemia-phosphorus is down to 1.0 this morning.  Will replace phosphorus with sodium phosphate 30 mmol.  Follow phosphorus level in a.m. 4. Frequent falls-patient was found on floor more than twice in the hospital.  CK within normal.  PT recommended skilled nursing facility placement. 5. Acute kidney injury with CKD stage IIIb-baseline creatinine around 1.3-1.4, creatinine is down to 1. 35.likely at baseline.  Continue with normal saline.  Follow BMP in am. 6. Chronic anemia-anemia panel in November 21 showed findings consistent with iron deficiency  anemia.  Continue iron supplementation. 7. Psoriatic arthritis-most ofher therapy is being held after surgery.  Continue prednisone 10 mg daily 8. UTI-urine culture growing E. coli sensitive to ceftriaxone, Keflex.  Ceftriaxone has been changed to p.o. Keflex. 9. Diabetes mellitus type 2-continue sliding scale insulin with NovoLog. 10. History bipolar disorder-continue Lamictal, Lexapro. 11. Hypertension-blood pressure is stable, 12. History of diverticulitis, perforation and colovesical fistula s/p repair-healing well.     COVID-19 Labs  No results for input(s): DDIMER, FERRITIN, LDH, CRP in the last 72 hours.  Lab Results  Component Value Date   SARSCOV2NAA NEGATIVE 08/09/2020   [place] NEGATIVE 07/23/2020   [place] NEGATIVE 07/13/2020   [place] NEGATIVE 06/04/2020     Scheduled medications:   . ALPRAZolam  1 mg Oral BID  . vitamin C  1,000 mg Oral QHS  . aspirin EC  81 mg Oral Daily  . cephALEXin  500 mg Oral Q12H  . donepezil  10 mg Oral Daily  . enoxaparin (LOVENOX) injection  40 mg Subcutaneous Q24H  . escitalopram  30 mg Oral Daily  . ferrous sulfate  325 mg Oral BID WC  . lamoTRIgine  150 mg Oral BID  . pantoprazole  40 mg Oral Daily  . polyethylene glycol  17 g Oral Daily  . predniSONE  10 mg Oral Q breakfast  . rosuvastatin  5 mg Oral q1800  . sodium chloride flush  10-40 mL Intracatheter Q12H  . sodium chloride flush  3 mL Intravenous Q12H         CBG: No results for input(s): GLUCAP in the last 168 hours.  SpO2: 95 %    CBC: Recent Labs  Lab  08/09/20 1312 08/10/20 0652 08/11/20 0438  WBC 16.6* 11.9* 8.7  NEUTROABS 13.7*  --   --   HGB 10.3* 8.5* 8.7*  HCT 33.2* 27.6* 28.6*  MCV 100.6* 100.7* 101.8*  PLT 231 200 240    Basic Metabolic Panel: Recent Labs  Lab 08/09/20 1312 08/09/20 1729 08/10/20 0652 08/11/20 0438 08/12/20 0428 08/13/20 0320  NA 135  --  140 140 138 141  K 3.5  --  3.9 4.0 3.9 4.0  CL 99  --  104 107  105 110  CO2 27  --  28 28 26 23   GLUCOSE 116*  --  86 86 85 67*  BUN 23*  --  16 16 13 11   CREATININE 1.78* 1.49* 1.56* 1.55* 1.43* 1.35*  CALCIUM 12.8* 12.0* 11.9* 11.7* 10.3 9.5  MG  --  2.0  --   --   --   --   PHOS  --  2.7  --  2.3* 1.1* 1.0*     Liver Function Tests: Recent Labs  Lab 08/09/20 1312 08/10/20 0652 08/11/20 0438 08/12/20 0428 08/13/20 0320  AST 23 15  --   --   --   ALT 22 16  --   --   --   ALKPHOS 83 69  --   --   --   BILITOT 0.5 0.5  --   --   --   PROT 6.6 5.3*  --   --   --   ALBUMIN 3.3* 2.4* 2.4* 2.5* 2.5*     Antibiotics: Anti-infectives (From admission, onward)   Start     Dose/Rate Route Frequency Ordered Stop   08/12/20 1800  cephALEXin (KEFLEX) capsule 500 mg        500 mg Oral Every 12 hours 08/12/20 1327     08/11/20 1730  cefTRIAXone (ROCEPHIN) 1 g in sodium chloride 0.9 % 100 mL IVPB  Status:  Discontinued        1 g 200 mL/hr over 30 Minutes Intravenous Every 24 hours 08/11/20 1631 08/12/20 1327       DVT prophylaxis: Lovenox  Code Status: Full code  Family Communication: No family at bedside     Objective   Vitals:   08/12/20 1401 08/12/20 2040 08/13/20 0635 08/13/20 1324  BP: 124/64 130/74 134/73 112/83  Pulse: 68 62 79 67  Resp: 18 18 18 18   Temp: 98.9 F (37.2 C) 98 F (36.7 C) 97.6 F (36.4 C)   TempSrc: Oral Oral Oral   SpO2: 96% 94% 95% 95%  Weight:      Height:        Intake/Output Summary (Last 24 hours) at 08/13/2020 1411 Last data filed at 08/13/2020 1300 Gross per 24 hour  Intake 2359.79 ml  Output 3225 ml  Net -865.21 ml    12/19 1901 - 12/21 0700 In: 4416.4 [P.O.:960; I.V.:3356.4] Out: 4175 [Urine:4175]  Filed Weights   08/09/20 2135  Weight: 80.7 kg    Physical Examination:    General-appears in no acute distress  Heart-S1-S2, regular, no murmur auscultated  Lungs-clear to auscultation bilaterally, no wheezing or crackles auscultated  Abdomen-soft, nontender, no  organomegaly  Extremities-no edema in the lower extremities  Neuro-alert, oriented x3, no focal deficit noted   Status is: Inpatient  Dispo: The patient is from: Home              Anticipated d/c is to: Home  Anticipated d/c date is: 08/15/2020              Patient currently not medically stable for discharge  Barrier to discharge-ongoing treatment for hypercalcemia       Data Reviewed:   Recent Results (from the past 240 hour(s))  Culture, Urine     Status: Abnormal   Collection Time: 08/09/20 12:37 PM   Specimen: Urine, Clean Catch  Result Value Ref Range Status   Specimen Description   Final    URINE, CLEAN CATCH Performed at [facility] [address], [place], [place] [address]    Special Requests   Final    NONE Performed at [facility], [place] [address] [place], [place] [address]    Culture >=100,000 COLONIES/mL ESCHERICHIA COLI (A)  Final   Report Status 08/12/2020 FINAL  Final   Organism ID, Bacteria ESCHERICHIA COLI (A)  Final      Susceptibility   Escherichia coli - MIC*    AMPICILLIN >=32 RESISTANT Resistant     CEFAZOLIN <=4 SENSITIVE Sensitive     CEFEPIME <=0.12 SENSITIVE Sensitive     CEFTRIAXONE <=0.25 SENSITIVE Sensitive     CIPROFLOXACIN 0.5 SENSITIVE Sensitive     GENTAMICIN <=1 SENSITIVE Sensitive     IMIPENEM <=0.25 SENSITIVE Sensitive     NITROFURANTOIN <=16 SENSITIVE Sensitive     TRIMETH/SULFA <=20 SENSITIVE Sensitive     AMPICILLIN/SULBACTAM >=32 RESISTANT Resistant     PIP/TAZO <=4 SENSITIVE Sensitive     * >=100,000 COLONIES/mL ESCHERICHIA COLI  Resp Panel by RT-PCR (Flu A&B, Covid) Nasopharyngeal Swab     Status: None   Collection Time: 08/09/20  2:13 PM   Specimen: Nasopharyngeal Swab; Nasopharyngeal(NP) swabs in vial transport medium  Result Value Ref Range Status   SARS Coronavirus 2 by RT PCR NEGATIVE NEGATIVE Final    Comment: (NOTE) SARS-CoV-2 target nucleic acids are NOT  DETECTED.  The SARS-CoV-2 RNA is generally detectable in upper respiratory specimens during the acute phase of infection. The lowest concentration of SARS-CoV-2 viral copies this assay can detect is 138 copies/mL. A negative result does not preclude SARS-Cov-2 infection and should not be used as the sole basis for treatment or other patient management decisions. A negative result may occur with  improper specimen collection/handling, submission of specimen other than nasopharyngeal swab, presence of viral mutation(s) within the areas targeted by this assay, and inadequate number of viral copies(<138 copies/mL). A negative result must be combined with clinical observations, patient history, and epidemiological information. The expected result is Negative.  Fact Sheet for Patients:  EntrepreneurPulse.com.au  Fact Sheet for Healthcare Providers:  IncredibleEmployment.be  This test is no t yet approved or cleared by the [place] FDA and  has been authorized for detection and/or diagnosis of SARS-CoV-2 by FDA under an Emergency Use Authorization (EUA). This EUA will remain  in effect (meaning this test can be used) for the duration of the COVID-19 declaration under Section 564(b)(1) of the Act, 21 U.S.C.section 360bbb-3(b)(1), unless the authorization is terminated  or revoked sooner.       Influenza A by PCR NEGATIVE NEGATIVE Final   Influenza B by PCR NEGATIVE NEGATIVE Final    Comment: (NOTE) The Xpert Xpress SARS-CoV-2/FLU/RSV plus assay is intended as an aid in the diagnosis of influenza from Nasopharyngeal swab specimens and should not be used as a sole basis for treatment. Nasal washings and aspirates are unacceptable for Xpert Xpress SARS-CoV-2/FLU/RSV testing.  Fact Sheet for Patients:  EntrepreneurPulse.com.au  Fact Sheet for Healthcare Providers: IncredibleEmployment.be  This test is not yet  approved or cleared by the [place] FDA and has been authorized for detection and/or diagnosis of SARS-CoV-2 by FDA under an Emergency Use Authorization (EUA). This EUA will remain in effect (meaning this test can be used) for the duration of the COVID-19 declaration under Section 564(b)(1) of the Act, 21 U.S.C. section 360bbb-3(b)(1), unless the authorization is terminated or revoked.  Performed at [facility], [place] [address], [place], [place] [address]      Cardiac Enzymes: Recent Labs  Lab 08/09/20 1710  [facility] [place]   [facility] If 7PM-7AM, please contact night-coverage at [email or web address], Office  [phone]   08/13/2020, 2:11 PM  LOS: 3 days

## 2020-08-13 NOTE — Progress Notes (Signed)
Physical Therapy Treatment Patient Details Name: Falisa Lamora MRN: [id number] DOB: 1961/02/05 Today's Date: 08/13/2020    History of Present Illness 59 y.o. 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 Dx of hypercalcemia, AKI.    PT Comments    Pt is very lethargic today, difficult to arouse. Her speech is mumbled. She responded briefly to questions, but fell asleep prior to completing response. She became more alert once in a sitting position. Max assist for bed mobility. Pt sat at edge of bed for 6 minutes, tolerance limited by abdominal pain.   Follow Up Recommendations  SNF;Supervision for mobility/OOB;Supervision/Assistance - 24 hour     Equipment Recommendations  None recommended by PT    Recommendations for Other Services       Precautions / Restrictions Precautions Precautions: Fall Precaution Comments: 3 falls in week prior to admission, pt/daughter report multiple falls in past 1 year Restrictions Weight Bearing Restrictions: No    Mobility  Bed Mobility Overal bed mobility: Needs Assistance Bed Mobility: Supine to Sit     Supine to sit: Mod assist   Sit to sidelying: Max assist General bed mobility comments: assist to raise trunk and pivot hips to edge of bed, max assist for LEs into bed; pt sat at edge of bed x 6 minutes, performed forward reaching with max verbal/manual cues  Transfers                 General transfer comment: unable  Ambulation/Gait             General Gait Details: unable   Stairs             Wheelchair Mobility    Modified Rankin (Stroke Patients  Only)       Balance   Sitting-balance support: Feet supported Sitting balance-Leahy Scale: Fair                                      Cognition Arousal/Alertness: Lethargic Behavior During Therapy: WFL for tasks assessed/performed Overall Cognitive Status: Impaired/Different from baseline Area of Impairment: Attention;Orientation;Following commands;Problem solving                 Orientation Level: Situation;Time     Following Commands: Follows one step commands inconsistently     Problem Solving: Decreased initiation;Slow processing;Difficulty sequencing;Requires verbal cues;Requires tactile cues General Comments: very lethargic, falls asleep while talking, more alert once in sitting position. Stated year is "2000". Oriented to self and location.      Exercises General Exercises - Upper Extremity Shoulder Flexion: AAROM;Both;10 reps;Supine General Exercises - Lower Extremity Ankle Circles/Pumps: AAROM;Both;10 reps;Supine Heel Slides: AROM;Both;Supine;15 reps    General Comments        Pertinent Vitals/Pain Faces Pain Scale: Hurts little more Pain Location: "belly" Pain Descriptors / Indicators: Grimacing;Guarding Pain Intervention(s): Limited activity within patient's tolerance;Monitored during session;Repositioned;Patient requesting pain meds-RN notified    Home Living                      Prior Function            PT Goals (current  goals can now be found in the care plan section) Acute Rehab PT Goals Patient Stated Goal: go to rehab PT Goal Formulation: With patient/family Time For Goal Achievement: 08/24/20 Potential to Achieve Goals: Fair Progress towards PT goals: Not progressing toward goals - comment (significantly lethargic today)    Frequency    Min 2X/week      PT Plan Current plan remains appropriate    Co-evaluation              AM-PAC PT "6 Clicks" Mobility   Outcome Measure  Help needed turning  from your back to your side while in a flat bed without using bedrails?: A Lot Help needed moving from lying on your back to sitting on the side of a flat bed without using bedrails?: A Lot Help needed moving to and from a bed to a chair (including a wheelchair)?: Total Help needed standing up from a chair using your arms (e.g., wheelchair or bedside chair)?: Total Help needed to walk in hospital room?: Total Help needed climbing 3-5 steps with a railing? : Total 6 Click Score: 8    End of Session Equipment Utilized During Treatment: Gait belt Activity Tolerance: Patient limited by lethargy Patient left: with call bell/phone within reach;with family/visitor present;in bed;with bed alarm set Nurse Communication: Mobility status PT Visit Diagnosis: Unsteadiness on feet (R26.81)     Time: [id number] PT Time Calculation (min) (ACUTE ONLY): 14 min  Charges:  $Therapeutic Activity: 8-22 mins                     Blondell Reveal Kistler PT 08/13/2020  Acute Rehabilitation Services Pager [phone] Office [phone]

## 2020-08-13 NOTE — Care Management Important Message (Signed)
Important Message  Patient Details IM Letter given to the Patient. Name: Khrystina Bonnes MRN: [id number] Date of Birth: 1960/09/30   Medicare Important Message Given:  Yes     Kerin Salen 08/13/2020, 2:25 PM

## 2020-08-14 ENCOUNTER — Ambulatory Visit: Payer: Medicare Other

## 2020-08-14 DIAGNOSIS — G9341 Metabolic encephalopathy: Secondary | ICD-10-CM

## 2020-08-14 DIAGNOSIS — B9620 Unspecified Escherichia coli [E. coli] as the cause of diseases classified elsewhere: Secondary | ICD-10-CM

## 2020-08-14 DIAGNOSIS — N1832 Chronic kidney disease, stage 3b: Secondary | ICD-10-CM

## 2020-08-14 DIAGNOSIS — N390 Urinary tract infection, site not specified: Secondary | ICD-10-CM

## 2020-08-14 DIAGNOSIS — R627 Adult failure to thrive: Secondary | ICD-10-CM

## 2020-08-14 LAB — CALCIUM, URINE, 24 HOUR
Calcium, 24 hour urine: 143 mg/24 hr (ref 0–320)
Calcium, Ur: 6.2 mg/dL
Total Volume: 2300

## 2020-08-14 LAB — RENAL FUNCTION PANEL
Albumin: 2.5 g/dL — ABNORMAL LOW (ref 3.5–5.0)
Anion gap: 9 (ref 5–15)
BUN: 13 mg/dL (ref 6–20)
CO2: 24 mmol/L (ref 22–32)
Calcium: 8.9 mg/dL (ref 8.9–10.3)
Chloride: 108 mmol/L (ref 98–111)
Creatinine, Ser: 1.32 mg/dL — ABNORMAL HIGH (ref 0.44–1.00)
GFR, Estimated: 46 mL/min — ABNORMAL LOW (ref >60)
Glucose, Bld: 76 mg/dL (ref 70–99)
Phosphorus: 1.8 mg/dL — ABNORMAL LOW (ref 2.5–4.6)
Potassium: 3.6 mmol/L (ref 3.5–5.1)
Sodium: 141 mmol/L (ref 135–145)

## 2020-08-14 MED ORDER — NADOLOL 20 MG PO TABS
10.0000 mg | ORAL_TABLET | Freq: Every day | ORAL | Status: DC
  Administered 2020-08-15 – 2020-08-21 (×7): 10 mg via ORAL
  Filled 2020-08-14 (×7): qty 1

## 2020-08-14 MED ORDER — K PHOS MONO-SOD PHOS DI & MONO 155-852-130 MG PO TABS
250.0000 mg | ORAL_TABLET | Freq: Two times a day (BID) | ORAL | Status: DC
  Administered 2020-08-14 (×2): 250 mg via ORAL
  Filled 2020-08-14 (×3): qty 1

## 2020-08-14 NOTE — Progress Notes (Addendum)
PROGRESS NOTE    Valerie [name]  [id number] DOB: February 29, 1961 DOA: 08/09/2020 PCP: Ophelia Shoulder, MD    Chief Complaint  Patient presents with  . Weakness    Brief Narrative:  Valerie [name] is a 59 y.o. 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  Patient was accompanied by her caregiver.  According to caregiver she is having multiple falls and unable to get up from floor, they have to call fire department twice in the last 2 days to help.  Patient is also very somnolent, sleeping randomly while talking.  Subjective:   Feeling better, more alert , per daughter she was sleep and confused initially, today confused has resolved , she appears back to baseline memory impairment She continue to reports feeling weak No fever, denies pain   Assessment & Plan:   Active Problems:   Generalized weakness   Hypercalcemia  Acute metabolic encephalopathy, present on admission -per caregiver "Patient is also very somnolent, sleeping randomly while talking." at home --Daughter report she was confused and very somnolent initially, today confusion appear has resolved, she is fully alert -Acute metabolic encephalopathy likely due to hypercalcemia and UTI  Hypercalcemia -Presented with generalized weakness, acute encephalopathy, constipation -TSH with normal limits, cortisol 7.8.  Vitamin D level normal at 68.  Calcitriol level unremarkable, PTH is mildly elevated at 73, PTH like protein in process, 24hr urine calcium unremarkabble -Calcium on presentation was 13.4, she does appear dehydrated, she received hydration ,  Zometa and  calcitonin nasal spray -calcium is 8.9 today, constipation resolved, DC hydration, encourage oral intake, repeat lab in the morning -Case discussed with endocrinology Dr. Benjiman Core who will recommend outpatient follow-up with her in 2 to 3 weeks, recommend repeat calcium  in 1 week by PCP  hypophosphatemia -phos nadir at 1 -today is 1.8 continue to replace  E. coli UTI Received Rocephin on 12/19 and then Keflex, last dose on 12/23  AKI on CKDIII b/anemia of chronic disease -Creatinine improved, hemoglobin stable -Monitor  Psoriatic arthritis On prednisone 10 mg daily Was on methotrexate, Plaquenil, Enbrel prior to surgery  Per chart review there is no history of diabetes, her A1c is 5.2 Fasting blood glucose range from 67 to 116 She is not on any medication for this at home Will monitor   History of CAD status post non-STEMI March 2021, history of CVA ( per chart review, though ct head and mri in the system did not show h/o CVA) Stable,  Was on Plavix, currently on asa Continue crestor  Continue nadolol Daughter reports patient was suppose to be on asa and plavix , but plavix held due to recently surgery, message sent to cardiology to clarify   HTN; continue nadalol  History bipolar disorder-continue Lamictal, Lexapro  H/o mild dementia, on aricept , aaox3, with mild memory deficit, at baseline today per daugher  Recent History of diverticulitis, perforation and colovesical fistula s/p repair-healing well   OSA on nightly cpap Body mass index is 32.54 kg/m.   FTT/frequent falls Therapy, rehab placement   DVT prophylaxis: enoxaparin (LOVENOX) injection 40 mg Start: 08/09/20 2200   Code Status:full Family Communication: daughter at bedside Disposition:   Status is: Inpatient  Dispo:  The patient is from: home              Anticipated d/c is to: SNF              Anticipated d/c date is: 24-48hrs                Consultants:   Phone conversation with  endocrinology Dr. Cruzita Lederer  Procedures:   none  Antimicrobials:   As above     Objective: Vitals:   08/13/20 1324 08/13/20 1955 08/13/20 2014 08/14/20 0646  BP: 112/83  130/80 129/68  Pulse: 67  (!) 54 64  Resp: 18 18 18 19   Temp:   (!) 97.4 F (36.3 C) 97.6 F (36.4 C)  TempSrc:   Oral Oral  SpO2: 95%  97% 94%  Weight:      Height:        Intake/Output Summary (Last 24 hours) at 08/14/2020 1156 Last data filed at 08/14/2020 1000 Gross per 24 hour  Intake 2822.72 ml  Output 2451 ml  Net 371.72 ml   Filed Weights   08/09/20 2135  Weight: 80.7 kg    Examination:  General exam: Chronically ill appearing ,calm, NAD, AAOx3, baseline memory deficit, immediate recall 2 out of 3 Respiratory system: Clear to auscultation. Respiratory effort normal. Cardiovascular system: S1 & S2 heard, RRR. No JVD, no murmur, No pedal edema. Gastrointestinal system: Abdomen is nondistended, soft and nontender.  Normal bowel sounds heard. Central nervous system: Alert and orientedx3. No focal neurological deficits. Extremities: equal , generalized weakness, chronic venous stasis changes, no edema Skin: chronic venous stasis changes lower legs Psychiatry: Judgement and insight appear normal. Mood & affect appropriate.     Data Reviewed: I have personally reviewed following labs and imaging studies  CBC: Recent Labs  Lab 08/09/20 1312 08/10/20 0652 08/11/20 0438  WBC 16.6* 11.9* 8.7  NEUTROABS 13.7*  --   --   HGB 10.3* 8.5* 8.7*  HCT 33.2* 27.6* 28.6*  MCV 100.6* 100.7* 101.8*  PLT 231 200 [id number]    Basic Metabolic Panel: Recent Labs  Lab 08/09/20 1729 08/10/20 0652 08/11/20 0438 08/12/20 0428 08/13/20 0320 08/14/20 0350  NA  --  140 140 138 141 141  K  --  3.9 4.0 3.9 4.0 3.6  CL  --  104 107 105 110 108  CO2  --  28 28 26 23 24   GLUCOSE  --  86 86 85 67* 76  BUN  --  16 16 13 11 13   CREATININE 1.49* 1.56* 1.55* 1.43* 1.35* 1.32*  CALCIUM 12.0* 11.9* 11.7* 10.3  9.5 8.9  MG 2.0  --   --   --   --   --   PHOS 2.7  --  2.3* 1.1* 1.0* 1.8*    GFR: Estimated Creatinine Clearance: 44.6 mL/min (A) (by C-G formula based on SCr of 1.32 mg/dL (H)).  Liver Function Tests: Recent Labs  Lab 08/09/20 1312 08/10/20 0652 08/11/20 0438 08/12/20 0428 08/13/20 0320 08/14/20 0350  AST 23 15  --   --   --   --   ALT 22 16  --   --   --   --   ALKPHOS 83 69  --   --   --   --   BILITOT 0.5 0.5  --   --   --   --   PROT 6.6 5.3*  --   --   --   --   ALBUMIN 3.3*  2.4* 2.4* 2.5* 2.5* 2.5*    CBG: No results for input(s): GLUCAP in the last 168 hours.   Recent Results (from the past 240 hour(s))  Culture, Urine     Status: Abnormal   Collection Time: 08/09/20 12:37 PM   Specimen: Urine, Clean Catch  Result Value Ref Range Status   Specimen Description   Final    URINE, CLEAN CATCH Performed at [facility], [place] [address], [place], [place] [address]    Special Requests   Final    NONE Performed at [facility], [place] [address] [place], [place] [address]    Culture >=100,000 COLONIES/mL ESCHERICHIA COLI (A)  Final   Report Status 08/12/2020 FINAL  Final   Organism ID, Bacteria ESCHERICHIA COLI (A)  Final      Susceptibility   Escherichia coli - MIC*    AMPICILLIN >=32 RESISTANT Resistant     CEFAZOLIN <=4 SENSITIVE Sensitive     CEFEPIME <=0.12 SENSITIVE Sensitive     CEFTRIAXONE <=0.25 SENSITIVE Sensitive     CIPROFLOXACIN 0.5 SENSITIVE Sensitive     GENTAMICIN <=1 SENSITIVE Sensitive     IMIPENEM <=0.25 SENSITIVE Sensitive     NITROFURANTOIN <=16 SENSITIVE Sensitive     TRIMETH/SULFA <=20 SENSITIVE Sensitive     AMPICILLIN/SULBACTAM >=32 RESISTANT Resistant     PIP/TAZO <=4 SENSITIVE Sensitive     * >=100,000 COLONIES/mL ESCHERICHIA COLI  Resp Panel by RT-PCR (Flu A&B, Covid) Nasopharyngeal Swab     Status: None   Collection Time: 08/09/20  2:13 PM   Specimen: Nasopharyngeal Swab; Nasopharyngeal(NP)  swabs in vial transport medium  Result Value Ref Range Status   SARS Coronavirus 2 by RT PCR NEGATIVE NEGATIVE Final    Comment: (NOTE) SARS-CoV-2 target nucleic acids are NOT DETECTED.  The SARS-CoV-2 RNA is generally detectable in upper respiratory specimens during the acute phase of infection. The lowest concentration of SARS-CoV-2 viral copies this assay can detect is 138 copies/mL. A negative result does not preclude SARS-Cov-2 infection and should not be used as the sole basis for treatment or other patient management decisions. A negative result may occur with  improper specimen collection/handling, submission of specimen other than nasopharyngeal swab, presence of viral mutation(s) within the areas targeted by this assay, and inadequate number of viral copies(<138 copies/mL). A negative result must be combined with clinical observations, patient history, and epidemiological information. The expected result is Negative.  Fact Sheet for Patients:  EntrepreneurPulse.com.au  Fact Sheet for Healthcare Providers:  IncredibleEmployment.be  This test is no t yet approved or cleared by the [place] FDA and  has been authorized for detection and/or diagnosis of SARS-CoV-2 by FDA under an Emergency Use Authorization (EUA). This EUA will remain  in effect (meaning this test can be used) for the duration of the COVID-19 declaration under Section 564(b)(1) of the Act, 21 U.S.C.section 360bbb-3(b)(1), unless the authorization is terminated  or revoked sooner.       Influenza A by PCR NEGATIVE NEGATIVE Final   Influenza B by PCR NEGATIVE NEGATIVE Final    Comment: (NOTE) The Xpert Xpress SARS-CoV-2/FLU/RSV plus assay is intended as an aid in the diagnosis of influenza from Nasopharyngeal swab specimens and should not be used as a sole basis for treatment. Nasal washings and aspirates are unacceptable for Xpert Xpress  SARS-CoV-2/FLU/RSV testing.  Fact Sheet for Patients: EntrepreneurPulse.com.au  Fact Sheet for Healthcare Providers: IncredibleEmployment.be  This test is not yet approved or cleared by the [place]  States FDA and has been authorized for detection and/or diagnosis of SARS-CoV-2 by FDA under an Emergency Use Authorization (EUA). This EUA will remain in effect (meaning this test can be used) for the duration of the COVID-19 declaration under Section 564(b)(1) of the Act, 21 U.S.C. section 360bbb-3(b)(1), unless the authorization is terminated or revoked.  Performed at [facility], [place] [address] [place], [place] [address]          Radiology Studies: No results found.      Scheduled Meds: . ALPRAZolam  1 mg Oral BID  . vitamin C  1,000 mg Oral QHS  . aspirin EC  81 mg Oral Daily  . cephALEXin  500 mg Oral Q12H  . donepezil  10 mg Oral Daily  . enoxaparin (LOVENOX) injection  40 mg Subcutaneous Q24H  . escitalopram  30 mg Oral Daily  . ferrous sulfate  325 mg Oral BID WC  . lamoTRIgine  150 mg Oral BID  . pantoprazole  40 mg Oral Daily  . phosphorus  250 mg Oral BID  . polyethylene glycol  17 g Oral Daily  . predniSONE  10 mg Oral Q breakfast  . rosuvastatin  5 mg Oral q1800  . sodium chloride flush  10-40 mL Intracatheter Q12H  . sodium chloride flush  3 mL Intravenous Q12H   Continuous Infusions: . sodium chloride 75 mL/hr at 08/14/20 1029     LOS: 4 days   Time spent: 35 mins, case discussed with endocrinology Dr. Cruzita Lederer, message sent to cardiology, contact urology office per family request Greater than 50% of this time was spent in counseling, explanation of diagnosis, planning of further management, and coordination of care.  I have personally reviewed and interpreted on  08/14/2020 daily labs, I reviewed all nursing notes, pharmacy notes,  vitals, pertinent old records  I have discussed plan of  care as described above with RN , patient and family on 08/14/2020  Voice Recognition /Dragon dictation system was used to create this note, attempts have been made to correct errors. Please contact the author with questions and/or clarifications.   Florencia Reasons, MD PhD FACP [facility]  Available via Epic secure chat 7am-7pm for nonurgent issues Please page for urgent issues To page the attending provider between 7A-7P or the covering provider during after hours 7P-7A, please log into the web site [email or web address] and access using universal [place] password for that web site. If you do not have the password, please call the hospital operator.    08/14/2020, 11:56 AM

## 2020-08-14 NOTE — Progress Notes (Signed)
Occupational Therapy Progress Note  Patient min A x2 for safety due to tremulous in standing and with functional transfer requiring cues for hand placement during sit <> stand. Patient able to stand pivot to [facility], requiring total A for peri care due to decreased standing tolerance and balance. Continue to recommend D/C to venue listed below in order to maximize patient strength, endurance, safety in order to safely participate in ADLs.    08/14/20 1200  OT Visit Information  Last OT Received On 08/14/20  Assistance Needed +2  PT/OT/SLP Co-Evaluation/Treatment Yes  Reason for Co-Treatment For patient/therapist safety  OT goals addressed during session ADL's and self-care  History of Present Illness 59 y.o. 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 Dx of hypercalcemia, AKI.  Precautions  Precautions Fall  Precaution Comments 3 falls in week prior to admission, pt/daughter report multiple falls in past 1 year  Pain Assessment  Pain Assessment Faces  Faces Pain Scale 4  Pain Location "belly"  Pain Descriptors / Indicators Grimacing;Guarding  Pain Intervention(s) Monitored during session  Cognition  Arousal/Alertness Awake/alert  Behavior During Therapy WFL for tasks assessed/performed  Overall Cognitive Status Within Functional Limits for tasks assessed  ADL  Overall ADL's  Needs assistance/impaired  Grooming Wash/dry face;Set up;Sitting  Toilet Transfer Minimal assistance;+2 for safety/equipment;Cueing for safety;Stand-pivot;RW;BSC  Toilet Transfer Details (indicate cue type and reason) decreased standing tolerance and balance,  no LE buckling however patient does become shaky in standing. cues for hand placement during sit <> stand  Toileting- Clothing Manipulation and Hygiene Total assistance;Sit to/from stand  Toileting - Clothing Manipulation Details (indicate cue type and reason) patient attempt to perform perianal care after bowel movement however having difficulty reaching and decreased standing balance + tolerance  Bed Mobility  Overal bed mobility Needs Assistance  Bed Mobility Sidelying to Sit;Rolling  Rolling Supervision  Sidelying to sit Min assist  General bed mobility comments min A to elevate trunk from side lying  Balance  Overall balance assessment Needs assistance  Sitting-balance support Feet supported;No upper extremity supported  Sitting balance-Leahy Scale Fair  Standing balance support Bilateral upper extremity supported;During functional activity  Standing balance-Leahy Scale Poor  Standing balance comment Reliance on BUE on RW  Transfers  Overall transfer level Needs assistance  Equipment used Rolling walker (2 wheeled)  Transfers Sit to/from [employer] Transfers  Sit to Stand +2 safety/equipment;Min assist  Stand pivot transfers Min assist;+2 safety/equipment  OT - End of Session  Equipment Utilized During Treatment Gait belt;Rolling walker  Activity Tolerance Patient tolerated treatment well  Patient left in chair;with call bell/phone within reach;with chair alarm set  Nurse Communication Mobility status  OT Assessment/Plan  OT Plan Discharge plan remains appropriate  OT Visit Diagnosis Unsteadiness on feet (R26.81);Other abnormalities of gait and mobility (R26.89);Muscle weakness (generalized) (M62.81);Pain  Pain - part of body  (abdomen)  OT Frequency (ACUTE ONLY) Min 2X/week  Follow Up Recommendations SNF  OT Equipment None recommended by OT  AM-PAC OT "6 Clicks" Daily Activity Outcome Measure (Version 2)  Help from another person eating meals? 3  Help from another  person taking care of personal grooming? 3  Help from another person toileting, which includes using toliet,  bedpan, or urinal? 2  Help from another person bathing (including washing, rinsing, drying)? 2  Help from another person to put on and taking off regular upper body clothing? 3  Help from another person to put on and taking off regular lower body clothing? 1  6 Click Score 14  OT Goal Progression  Progress towards OT goals Progressing toward goals  Acute Rehab OT Goals  Patient Stated Goal go to rehab  OT Goal Formulation With patient/family  Time For Goal Achievement 08/24/20  Potential to Achieve Goals Good  ADL Goals  Pt Will Perform Lower Body Dressing sit to/from stand;with min guard assist;with adaptive equipment  Pt Will Transfer to Toilet with supervision;ambulating;regular height toilet;grab bars  Pt Will Perform Toileting - Clothing Manipulation and hygiene with supervision;sit to/from stand  Pt/caregiver will Perform Home Exercise Program Right Upper extremity;Left upper extremity;Increased strength;With theraband  Additional ADL Goal #1 Patient will perform 10 min functional activity or exercise activity as evidence of improving activity tolerance  OT Time Calculation  OT Start Time (ACUTE ONLY) 1047  OT Stop Time (ACUTE ONLY) 1110  OT Time Calculation (min) 23 min  OT General Charges  $OT Visit 1 Visit  OT Treatments  $Self Care/Home Management  8-22 mins   Delbert Phenix OT OT pager: [phone]

## 2020-08-14 NOTE — Progress Notes (Signed)
Physical Therapy Treatment Patient Details Name: Valerie [name] MRN: [id number] DOB: 1961-06-11 Today's Date: 08/14/2020    History of Present Illness 59 y.o. 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 Dx of hypercalcemia, AKI.    PT Comments    Pt is much more alert today. Min assist for sit to stand and for stand pivot transfer with RW x 2 trials. She is able to stand for ~90 seconds with a RW before becoming fatigued.  Pt performed seated BLE exercises for strengthening.   Follow Up Recommendations  SNF;Supervision for mobility/OOB;Supervision/Assistance - 24 hour     Equipment Recommendations  None recommended by PT    Recommendations for Other Services       Precautions / Restrictions Precautions Precautions: Fall Precaution Comments: 3 falls in week prior to admission, pt/daughter report multiple falls in past 1 year Restrictions Weight Bearing Restrictions: No    Mobility  Bed Mobility Overal bed mobility: Needs Assistance Bed Mobility: Sidelying to Sit;Rolling Rolling: Supervision Sidelying to sit: Min assist          Transfers Overall transfer level: Needs assistance Equipment used: Rolling walker (2 wheeled) Transfers: Sit to/from [employer] Transfers Sit to Stand: +2 safety/equipment;Min assist Stand pivot transfers: Min assist;+2 safety/equipment       General transfer comment: VCs hand placement, min A to power up, stand pivot tx x 2; pt stood for ~90 seconds with RW for pericare, standing tolerance limited by generalized weakness (pt tremulous )  Ambulation/Gait                  Stairs             Wheelchair Mobility    Modified Rankin (Stroke Patients Only)       Balance Overall balance assessment: Needs assistance Sitting-balance support: Feet supported;No upper extremity supported Sitting balance-Leahy Scale: Fair     Standing balance support: Bilateral upper extremity supported;During functional activity Standing balance-Leahy Scale: Poor Standing balance comment: Reliance on BUE on RW                            Cognition Arousal/Alertness: Awake/alert Behavior During Therapy: WFL for tasks assessed/performed Overall Cognitive Status: Within Functional Limits for tasks assessed Area of Impairment: Attention;Orientation;Following commands;Problem solving                       Following Commands: Follows one step commands consistently     Problem Solving: Decreased initiation;Slow processing;Difficulty sequencing;Requires verbal cues;Requires tactile cues        Exercises General Exercises - Lower Extremity Ankle Circles/Pumps: AROM;Both;Seated;15 reps Long Arc Quad: AROM;Both;20 reps;Seated Hip Flexion/Marching: AROM;Both;15 reps;Seated    General Comments        Pertinent Vitals/Pain Pain Score: 3  Pain Location: "belly" Pain Descriptors / Indicators: Grimacing;Guarding Pain Intervention(s): Limited activity within patient's tolerance;Monitored during session    Home Living                      Prior Function            PT Goals (current goals can now be found in the  care plan section) Acute Rehab PT Goals Patient Stated Goal: go to rehab PT Goal Formulation: With patient/family Time For Goal Achievement: 08/24/20 Potential to Achieve Goals: Good Progress towards PT goals: Progressing toward goals    Frequency    Min 2X/week      PT Plan Current plan remains appropriate    Co-evaluation              AM-PAC PT "6 Clicks" Mobility   Outcome Measure  Help needed  turning from your back to your side while in a flat bed without using bedrails?: A Little Help needed moving from lying on your back to sitting on the side of a flat bed without using bedrails?: A Lot Help needed moving to and from a bed to a chair (including a wheelchair)?: A Little Help needed standing up from a chair using your arms (e.g., wheelchair or bedside chair)?: A Little Help needed to walk in hospital room?: A Lot Help needed climbing 3-5 steps with a railing? : Total 6 Click Score: 14    End of Session Equipment Utilized During Treatment: Gait belt Activity Tolerance: Patient tolerated treatment well Patient left: with call bell/phone within reach;with family/visitor present;in chair;with chair alarm set Nurse Communication: Mobility status PT Visit Diagnosis: Unsteadiness on feet (R26.81)     Time: [id number] PT Time Calculation (min) (ACUTE ONLY): 24 min  Charges:  $Therapeutic Activity: 8-22 mins                    Blondell Reveal Kistler PT 08/14/2020  Acute Rehabilitation Services Pager [phone] Office [phone]

## 2020-08-14 NOTE — TOC Progression Note (Signed)
Transition of Care [facility]) - Progression Note    Patient Details  Name: Valerie [name] MRN: [id number] Date of Birth: 23-Feb-1961  [facility]) CM/SW [place], LCSW Phone Number: 08/14/2020, 12:31 PM  Clinical Narrative:    Daughter Chose [facility] to for short rehab. Patient agreeable.  Anticiapte D/C tomorrow.  [facility] authorization approval [id number] 6 days 12/22-12/27, details provided to SNF admission coordinator Loie. Physician please order a covid test.   Expected Discharge Plan: Skilled Nursing Facility Barriers to Discharge: Continued Medical Work up  Expected Discharge Plan and Services Expected Discharge Plan: [place] In-house Referral: Clinical Social Work     Living arrangements for the past 2 months: [place] (Pt recently at SNF x 2 weeks then home alone x 1 week with rehospitalization)                                       Social Determinants of Health (SDOH) Interventions    Readmission Risk Interventions No flowsheet data found.

## 2020-08-14 NOTE — Progress Notes (Addendum)
Nurse Tech went into the patients room to assist her to the chair. Patient went to sit down and her knees buckled. Nurse Tech assisted the patient to the floor. Patient did not hit her head or lose consciousness. Patient remains alert and oriented, vitals are stable, MD made aware, and post fall huddle was performed. Patient was placed in a low bed with floor mats in place.

## 2020-08-15 ENCOUNTER — Inpatient Hospital Stay (HOSPITAL_COMMUNITY): Payer: Medicare Other

## 2020-08-15 LAB — PROTEIN ELECTROPHORESIS, SERUM
A/G Ratio: 1 (ref 0.7–1.7)
Albumin ELP: 2.6 g/dL — ABNORMAL LOW (ref 2.9–4.4)
Alpha-1-Globulin: 0.4 g/dL (ref 0.0–0.4)
Alpha-2-Globulin: 0.6 g/dL (ref 0.4–1.0)
Beta Globulin: 0.7 g/dL (ref 0.7–1.3)
Gamma Globulin: 0.8 g/dL (ref 0.4–1.8)
Globulin, Total: 2.5 g/dL (ref 2.2–3.9)
Total Protein ELP: 5.1 g/dL — ABNORMAL LOW (ref 6.0–8.5)

## 2020-08-15 LAB — CBC WITH DIFFERENTIAL/PLATELET
Abs Immature Granulocytes: 0.06 10*3/uL (ref 0.00–0.07)
Basophils Absolute: 0 10*3/uL (ref 0.0–0.1)
Basophils Relative: 0 %
Eosinophils Absolute: 0.8 10*3/uL — ABNORMAL HIGH (ref 0.0–0.5)
Eosinophils Relative: 8 %
HCT: 26.7 % — ABNORMAL LOW (ref 36.0–46.0)
Hemoglobin: 8.3 g/dL — ABNORMAL LOW (ref 12.0–15.0)
Immature Granulocytes: 1 %
Lymphocytes Relative: 12 %
Lymphs Abs: 1.1 10*3/uL (ref 0.7–4.0)
MCH: 31.3 pg (ref 26.0–34.0)
MCHC: 31.1 g/dL (ref 30.0–36.0)
MCV: 100.8 fL — ABNORMAL HIGH (ref 80.0–100.0)
Monocytes Absolute: 0.7 10*3/uL (ref 0.1–1.0)
Monocytes Relative: 7 %
Neutro Abs: 6.9 10*3/uL (ref 1.7–7.7)
Neutrophils Relative %: 72 %
Platelets: 183 10*3/uL (ref 150–400)
RBC: 2.65 MIL/uL — ABNORMAL LOW (ref 3.87–5.11)
RDW: 14.6 % (ref 11.5–15.5)
WBC: 9.5 10*3/uL (ref 4.0–10.5)
nRBC: 0 % (ref 0.0–0.2)

## 2020-08-15 LAB — BASIC METABOLIC PANEL
Anion gap: 11 (ref 5–15)
BUN: 11 mg/dL (ref 6–20)
CO2: 21 mmol/L — ABNORMAL LOW (ref 22–32)
Calcium: 8.3 mg/dL — ABNORMAL LOW (ref 8.9–10.3)
Chloride: 108 mmol/L (ref 98–111)
Creatinine, Ser: 1.32 mg/dL — ABNORMAL HIGH (ref 0.44–1.00)
GFR, Estimated: 46 mL/min — ABNORMAL LOW (ref >60)
Glucose, Bld: 93 mg/dL (ref 70–99)
Potassium: 3.3 mmol/L — ABNORMAL LOW (ref 3.5–5.1)
Sodium: 140 mmol/L (ref 135–145)

## 2020-08-15 LAB — ANGIOTENSIN CONVERTING ENZYME: Angiotensin-Converting Enzyme: 41 U/L (ref 14–82)

## 2020-08-15 LAB — SARS CORONAVIRUS 2 BY RT PCR (HOSPITAL ORDER, PERFORMED IN ~~LOC~~ HOSPITAL LAB): SARS Coronavirus 2: NEGATIVE

## 2020-08-15 LAB — PHOSPHORUS: Phosphorus: 1.9 mg/dL — ABNORMAL LOW (ref 2.5–4.6)

## 2020-08-15 MED ORDER — POTASSIUM CHLORIDE CRYS ER 20 MEQ PO TBCR
40.0000 meq | EXTENDED_RELEASE_TABLET | Freq: Once | ORAL | Status: AC
  Administered 2020-08-15: 40 meq via ORAL
  Filled 2020-08-15: qty 2

## 2020-08-15 MED ORDER — CEPHALEXIN 500 MG PO CAPS
500.0000 mg | ORAL_CAPSULE | Freq: Two times a day (BID) | ORAL | Status: AC
  Administered 2020-08-15: 500 mg via ORAL
  Filled 2020-08-15: qty 1

## 2020-08-15 MED ORDER — HYDROCODONE-ACETAMINOPHEN 5-325 MG PO TABS
1.0000 | ORAL_TABLET | ORAL | Status: DC | PRN
Start: 2020-08-15 — End: 2020-08-21
  Administered 2020-08-18 – 2020-08-20 (×8): 1 via ORAL
  Filled 2020-08-15 (×9): qty 1

## 2020-08-15 MED ORDER — PHENAZOPYRIDINE HCL 100 MG PO TABS
100.0000 mg | ORAL_TABLET | Freq: Three times a day (TID) | ORAL | Status: DC | PRN
  Administered 2020-08-15 – 2020-08-19 (×3): 100 mg via ORAL
  Filled 2020-08-15 (×2): qty 1

## 2020-08-15 MED ORDER — CHLORHEXIDINE GLUCONATE CLOTH 2 % EX PADS
6.0000 | MEDICATED_PAD | Freq: Every day | CUTANEOUS | Status: DC
  Administered 2020-08-15 – 2020-08-21 (×7): 6 via TOPICAL

## 2020-08-15 MED ORDER — IOHEXOL 300 MG/ML  SOLN
100.0000 mL | Freq: Once | INTRAMUSCULAR | Status: AC | PRN
  Administered 2020-08-15: 100 mL via INTRAVENOUS

## 2020-08-15 MED ORDER — FERROUS SULFATE 325 (65 FE) MG PO TABS
325.0000 mg | ORAL_TABLET | Freq: Every day | ORAL | Status: DC
  Administered 2020-08-16 – 2020-08-21 (×6): 325 mg via ORAL
  Filled 2020-08-15 (×7): qty 1

## 2020-08-15 MED ORDER — K PHOS MONO-SOD PHOS DI & MONO 155-852-130 MG PO TABS
250.0000 mg | ORAL_TABLET | Freq: Two times a day (BID) | ORAL | Status: DC
  Administered 2020-08-15 – 2020-08-16 (×3): 250 mg via ORAL
  Filled 2020-08-15 (×4): qty 1

## 2020-08-15 NOTE — Consult Note (Signed)
CC: Pneumaturia  Requesting provider: Dr Erlinda Hong  HPI: Valerie [name] is an 59 y.o. female status post robotic low anterior rectosigmoid resection with primary colovesicular fistula repair, mobilization of splenic flexure, omental pedicle flap with omentopexy of bladder repair 07/17/2020 by Dr. 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  She had been discharged to skilled nursing facility on November 30.  She had seen urology around December 16 with complaints of foul-smelling urine and pneumaturia.  In the ER she was found to have elevated creatinine and UA consistent with recurrent UTI.  Her urine culture grew more than 100,000 E. Coli.  she was started on antibiotics.  And she had been clinically improving.  She had had no fever, tachycardia.  She had been tolerating a diet and having bowel movements and was actually being planned to transfer back to rehab on Friday.  However she complained of pneumaturia and urology was reconsulted.  A CT cystogram was performed earlier today which showed recurrent colovesicular fistula arising from the dome of the bladder extending to the sigmoid colon with a 8 x 4 x 4 cm abscess adjacent to the bladder and sigmoid colon.  There was some free air however there was no extravasation of contrast into the abdominal cavity.  There is also a subcutaneous fluid collection in the anterior pelvic wall.  Her comorbidities include obesity, psoriatic arthritis on chronic prednisone.  She had been on biologic therapy but had been held perioperatively.  She also has a significant smoking history.  Other comorbidities include obstructive sleep apnea, chronic kidney disease, anemia, bipolar disorder  We were called because of the findings on the CT scan today  Her daughter is at the bedside  She denies any abdominal pain.  She denies any nausea or vomiting.  She is  having bowel movements.  She has a Foley catheter.  No fever or chills.  She feels better than when she came in but still reports some ongoing weakness.  Past Medical History:  Diagnosis Date  . Anemia   . Anxiety   . Arthritis    Psoriatic  . Bipolar disorder ([place])   . CKD (chronic kidney disease), stage III ([place])   . Colovesical fistula   . Depression   . DM type 2 (diabetes mellitus, type 2) ([place]) 11/14/2019  . Dyspnea   . Headache   . Neuropathy   . NSTEMI (non-ST elevated myocardial infarction) ([place]) 11/14/2019  . OSA (obstructive sleep apnea)   . Psoriatic arthritis ([place])   . Sleep apnea   . Stroke [facility])    Memory issues, March 2021    Past Surgical History:  Procedure Laterality Date  . APPENDECTOMY    . CHOLECYSTECTOMY    . CYSTOSCOPY WITH STENT PLACEMENT N/A 07/17/2020   Procedure: CYSTOSCOPY WITH BILATERAL FIREFLY INJECTION;  Surgeon: Raynelle Bring, MD;  Location: [facility];  Service: Urology;  Laterality: N/A;  . FLEXIBLE SIGMOIDOSCOPY N/A 05/17/2020   Procedure: FLEXIBLE SIGMOIDOSCOPY;  Surgeon: Carol Ada, MD;  Location: [facility];  Service: Endoscopy;  Laterality: N/A;  . FOOT SURGERY Right   . LEFT HEART CATH AND CORONARY ANGIOGRAPHY N/A 11/14/2019   Procedure: LEFT HEART CATH AND CORONARY ANGIOGRAPHY;  Surgeon: Martinique, Peter M, MD;  Location: [facility];  Service: Cardiovascular;  Laterality: N/A;  . PROCTOSCOPY N/A 07/17/2020   Procedure: RIGID PROCTOSCOPY;  Surgeon: Michael Boston, MD;  Location: [facility];  Service: General;  Laterality: N/A;  . TONSILLECTOMY    . TOTAL HIP ARTHROPLASTY Bilateral     Family History  Problem Relation Age of Onset  . Breast cancer Neg Hx     Social:  reports that she has been smoking. She has been smoking about 0.50 packs per day. She has never used smokeless tobacco. She reports previous alcohol use. She reports that she does not use drugs.  Allergies:  Allergies  Allergen Reactions  . Carbamazepine Other (See  Comments)    Parkinsons like symptoms tremors  . Lyrica [Pregabalin] Other (See Comments)    Sedation Other reaction(s): sedated  . Sertraline Hcl Other (See Comments)    Unknown reaction    Medications: I have reviewed the patient's current medications.   ROS - all of the below systems have been reviewed with the patient and positives are indicated with bold text General: chills, fever or night sweats Eyes: blurry vision or double vision ENT: epistaxis or sore throat Allergy/Immunology: itchy/watery eyes or nasal congestion Hematologic/Lymphatic: bleeding problems, blood clots or swollen lymph nodes Endocrine: temperature intolerance or unexpected weight changes Breast: new or changing breast lumps or nipple discharge Resp: cough, shortness of breath, or wheezing CV: chest pain or dyspnea on exertion GI: as per HPI GU: dysuria, trouble voiding, or hematuria MSK: joint pain or joint stiffness Neuro: TIA or stroke symptoms Derm: pruritus and skin lesion changes Psych: anxiety and depression  PE Blood pressure 116/62, pulse 61, temperature 98.4 F (36.9 C), temperature source Oral, resp. rate 16, height 5\' 2"  (1.575 m), weight 80.7 kg, SpO2 94 %. Constitutional: NAD; conversant; no deformities Eyes: Moist conjunctiva; no lid lag; anicteric; PERRL Neck: Trachea midline; no thyromegaly Lungs: Normal respiratory effort; no tactile fremitus CV: RRR; no palpable thrills; no pitting edema GI: Abd soft, nontender, healed trocar scars; no palpable hepatosplenomegaly MSK: Normal gait; no clubbing/cyanosis Psychiatric: Appropriate affect; alert and oriented x3 Lymphatic: No palpable cervical or axillary lymphadenopathy Skin:no rash, edema, induration  Results for orders placed or performed during the hospital encounter of 08/09/20 (from the past 48 hour(s))  Renal function panel     Status: Abnormal   Collection Time: 08/14/20  3:50 AM  Result Value Ref Range   Sodium 141 135 -  145 mmol/L   Potassium 3.6 3.5 - 5.1 mmol/L   Chloride 108 98 - 111 mmol/L   CO2 24 22 - 32 mmol/L   Glucose, Bld 76 70 - 99 mg/dL    Comment: Glucose reference range applies only to samples taken after fasting for at least 8 hours.   BUN 13 6 - 20 mg/dL   Creatinine, Ser 1.32 (H) 0.44 - 1.00 mg/dL   Calcium 8.9 8.9 - 10.3 mg/dL   Phosphorus 1.8 (L) 2.5 - 4.6 mg/dL   Albumin 2.5 (L) 3.5 - 5.0 g/dL   GFR, Estimated 46 (L) >60 mL/min    Comment: (NOTE) Calculated using the CKD-EPI Creatinine Equation (2021)    Anion gap 9 5 - 15    Comment: Performed at [facility], [place] [address], [place], [place] [address]  Protein electrophoresis, serum     Status: Abnormal   Collection Time: 08/14/20 11:35 AM  Result Value Ref Range   Total Protein ELP 5.1 (L) 6.0 - 8.5 g/dL   Albumin ELP 2.6 (L) 2.9 - 4.4 g/dL   Alpha-1-Globulin 0.4 0.0 - 0.4 g/dL   Alpha-2-Globulin 0.6 0.4 - 1.0 g/dL   Beta Globulin 0.7 0.7 -  1.3 g/dL   Gamma Globulin 0.8 0.4 - 1.8 g/dL   M-Spike, % Not Observed Not Observed g/dL   SPE Interp. Comment     Comment: (NOTE) The SPE pattern reflects hypoalbuminemia. Evidence of monoclonal protein is not apparent. Performed At: [facility] [place], [place] [id number] Rush Farmer MD [id number]    Comment Comment     Comment: (NOTE) Protein electrophoresis scan will follow via computer, mail, or courier delivery.    Globulin, Total 2.5 2.2 - 3.9 g/dL   A/G Ratio 1.0 0.7 - 1.7  Angiotensin converting enzyme     Status: None   Collection Time: 08/14/20 11:35 AM  Result Value Ref Range   Angiotensin-Converting Enzyme 41 14 - 82 U/L    Comment: (NOTE) Performed At: [facility] [address] [place], [place] [id number] Rush Farmer MD [id number]   SARS Coronavirus 2 by RT PCR (hospital order, performed in [facility] hospital lab) Nasopharyngeal Nasopharyngeal Swab     Status: None   Collection Time: 08/14/20  11:59 PM   Specimen: Nasopharyngeal Swab  Result Value Ref Range   SARS Coronavirus 2 NEGATIVE NEGATIVE    Comment: (NOTE) SARS-CoV-2 target nucleic acids are NOT DETECTED.  The SARS-CoV-2 RNA is generally detectable in upper and lower respiratory specimens during the acute phase of infection. The lowest concentration of SARS-CoV-2 viral copies this assay can detect is 250 copies / mL. A negative result does not preclude SARS-CoV-2 infection and should not be used as the sole basis for treatment or other patient management decisions.  A negative result may occur with improper specimen collection / handling, submission of specimen other than nasopharyngeal swab, presence of viral mutation(s) within the areas targeted by this assay, and inadequate number of viral copies (<250 copies / mL). A negative result must be combined with clinical observations, patient history, and epidemiological information.  Fact Sheet for Patients:   StrictlyIdeas.no  Fact Sheet for Healthcare Providers: BankingDealers.co.za  This test is not yet approved or  cleared by the [place] FDA and has been authorized for detection and/or diagnosis of SARS-CoV-2 by FDA under an Emergency Use Authorization (EUA).  This EUA will remain in effect (meaning this test can be used) for the duration of the COVID-19 declaration under Section 564(b)(1) of the Act, 21 U.S.C. section 360bbb-3(b)(1), unless the authorization is terminated or revoked sooner.  Performed at [facility], [place] [address], [place], [place] [address]   CBC with Differential/Platelet     Status: Abnormal   Collection Time: 08/15/20  5:35 AM  Result Value Ref Range   WBC 9.5 4.0 - 10.5 K/uL   RBC 2.65 (L) 3.87 - 5.11 MIL/uL   Hemoglobin 8.3 (L) 12.0 - 15.0 g/dL   HCT 26.7 (L) 36.0 - 46.0 %   MCV 100.8 (H) 80.0 - 100.0 fL   MCH 31.3 26.0 - 34.0 pg   MCHC 31.1 30.0 - 36.0  g/dL   RDW 14.6 11.5 - 15.5 %   Platelets 183 150 - 400 K/uL   nRBC 0.0 0.0 - 0.2 %   Neutrophils Relative % 72 %   Neutro Abs 6.9 1.7 - 7.7 K/uL   Lymphocytes Relative 12 %   Lymphs Abs 1.1 0.7 - 4.0 K/uL   Monocytes Relative 7 %   Monocytes Absolute 0.7 0.1 - 1.0 K/uL   Eosinophils Relative 8 %   Eosinophils Absolute 0.8 (H) 0.0 - 0.5 K/uL   Basophils Relative 0 %  Basophils Absolute 0.0 0.0 - 0.1 K/uL   Immature Granulocytes 1 %   Abs Immature Granulocytes 0.06 0.00 - 0.07 K/uL    Comment: Performed at [facility], [place] [address], [place], [place] [id number]  Basic metabolic panel     Status: Abnormal   Collection Time: 08/15/20  5:35 AM  Result Value Ref Range   Sodium 140 135 - 145 mmol/L   Potassium 3.3 (L) 3.5 - 5.1 mmol/L   Chloride 108 98 - 111 mmol/L   CO2 21 (L) 22 - 32 mmol/L   Glucose, Bld 93 70 - 99 mg/dL    Comment: Glucose reference range applies only to samples taken after fasting for at least 8 hours.   BUN 11 6 - 20 mg/dL   Creatinine, Ser 1.32 (H) 0.44 - 1.00 mg/dL   Calcium 8.3 (L) 8.9 - 10.3 mg/dL   GFR, Estimated 46 (L) >60 mL/min    Comment: (NOTE) Calculated using the CKD-EPI Creatinine Equation (2021)    Anion gap 11 5 - 15    Comment: Performed at [facility], [place] [address] [place], [place] [address]  Phosphorus     Status: Abnormal   Collection Time: 08/15/20  5:35 AM  Result Value Ref Range   Phosphorus 1.9 (L) 2.5 - 4.6 mg/dL    Comment: Performed at [facility], [place] [address], [place], [place] [address]    CT ABDOMEN PELVIS W CONTRAST  Result Date: 08/15/2020 CLINICAL DATA:  Vesicointestinal fistula, persistent colovesical fistula, CT cystogram EXAM: CT ABDOMEN AND PELVIS WITH CONTRAST TECHNIQUE: Multidetector CT imaging of the abdomen and pelvis was performed using the standard protocol following bolus administration of intravenous contrast. CONTRAST:  161mL OMNIPAQUE IOHEXOL  300 MG/ML  SOLN COMPARISON:  06/04/2020 FINDINGS: Lower chest: Dependent atelectasis at lung bases Hepatobiliary: Gallbladder surgically absent.  Liver unremarkable. Pancreas: Normal appearance Spleen: Normal appearance Adrenals/Urinary Tract: Adrenal glands normal appearance. BILATERAL perinephric stranding, nonspecific. BILATERAL renal cysts. No additional renal mass, hydronephrosis or hydroureter. No urinary tract calcifications. Foley catheter within urinary bladder. Bladder distended by contrast material. Contrast also opacifies the sigmoid colon and rectum, with contrast seen extending proximally into the descending colon. Contrast is also seen extending into a gas and fluid containing collection anterosuperior to the urinary bladder 8.0 x 4.0 x 4.4 cm image 66 consistent with perforation/abscess. Surrounding infiltrative changes. Stomach/Bowel: Diverticulosis of sigmoid colon. Prior takedown of at splenic flexure of colon. Wall thickening of sigmoid colon with adjacent infiltrative changes question diverticulitis. Sigmoid anastomosis staple line noted. Appendix surgically absent by history. Stomach and small bowel loops normal appearance. Proximal colon normal appearance. Mild rectal wall thickening. Vascular/Lymphatic: Atherosclerotic calcification aorta, minimal. Aorta normal caliber. No adenopathy. Few normal sized pelvic lymph nodes seen. Reproductive: Uterus surgically absent. Questionable visualization of normal sized ovaries. Other: Foci of free air in the abdomen which could be related to recent procedure or perforation. Stranding in presacral fat. No definite ascites. No hernia. Subcutaneous fluid collection and anterior pelvic wall, 8.7 x 2.6 cm extending 5.5 cm length, could be sterile or infected. Musculoskeletal: Osseous demineralization. Chronic superior endplate compression fractures of T9 and T11. No acute osseous findings. IMPRESSION: Colovesical fistula arising from the superior margin of  the bladder and extending to the sigmoid colon, with cystogram contrast material opacifying the rectum, sigmoid colon and descending colon. 8.0 x 4.0 x 4.4 cm diameter abscess collection adjacent to the bladder and sigmoid colon, also opacifying with contrast from  bladder fistula. Free air within the abdomen consistent with perforation. Sigmoid diverticulosis with wall thickening and adjacent infiltrative changes though these could be related to prior surgery rather than diverticulitis. Subcutaneous fluid collection and gas collection anterior pelvic wall 8.7 x 2.6 x 5.5 cm, could be sterile or infected. Aortic Atherosclerosis (ICD10-I70.0). Critical Value/emergent results were called by telephone at the time of interpretation on 08/15/2020 at 1640 hours to provider DR. MATTHEW GAY, who verbally acknowledged these results. Electronically Signed   By: Lavonia Dana M.D.   On: 08/15/2020 16:41    Imaging: Personally reviewed and discussed with reading radiologist  A/P: Valerie [name] is an 59 y.o. female with  Recurrent colovesicular fistula with abscess Status post robotic low anterior resection with primary colovesicular repair with omental flap and omentopexy 07/17/2020 by Dr. Johney Maine Psoriatic arthritis on immunosuppression -prednisone; methotrexate, Plaquenil, Enbrel held Obesity Chronic anemia E. coli UTI Acute kidney injury on chronic kidney disease-resolved Acute metabolic encephalopathy-resolved  She is completely nontoxic appearing.  She is afebrile without tachycardia or hypotension.  Her abdominal exam is benign. Contrast does not extravasate freely into the abdominal cavity I do not believe she needs urgent surgical exploration.  Moreover I do not believe it is wise to go back into her abdomen at the 1 month mark because her abdomen would be hostile with probably dense adhesions D/w with Dr Georgette Dover  Definitive plan would be to reexplore the abdomen at the 6-week mark at the earliest and  will likely need resection and ostomy.    I think the fluid collection adjacent to the dome of the bladder and sigmoid is probably the omentum that was placed on top of the dome of the bladder  Nonetheless we will ask IR to evaluate the patient for percutaneous drain placement  N.p.o. after midnight except meds Check coags in a.m. Consult IR in a.m. for potential drain placement Continue Foley catheter rec ID consult to see if abx need to be extended given new clinical findings If decompensates during this admission - best surgical option would be loop ileostomy. But since she is afebrile, nontoxic, benign exam - would hold off on that  Discussed with pt and daughter    Leighton Ruff. Redmond Pulling, MD, FACS General, Bariatric, & Minimally Invasive Surgery [facility], [facility]&

## 2020-08-15 NOTE — Consult Note (Signed)
Urology Consult   Physician requesting consult: Florencia Reasons, MD  Reason for consult: UTI, pneumaturia, colovesicular fistula  History of Present Illness: Valerie [name] is a 59 y.o. who is seen in consultation for a history of colovesicular fistula.  CT A/P 05/14/2020 with diverticulitis with concern for contained perforation adjacent to the bladder as well as concern for colovesicular fistula.   Flexible sigmoidoscopy on 05/17/2020 with benign-appearing, intrinsic moderate stenosis in the sigmoid colon with no evidence of any malignancy and evidence of diverticulosis in the sigmoid colon.   She is s/p robotic low anterior rectosigmoid resection with primary colovesical fistula repair, omental pedicle flap of bladder repair with Dr. Michael Boston on 07/17/2020.   She was seen in my office on 08/08/2020 with complaints of foul-smelling urine, several days of pneumaturia.  At the time, she denies dysuria or gross hematuria.  She presented the ED on 08/09/2020 with persistent generalized weakness, falls, altered mental status.  She was found to have a UTI with urine culture on 12/17 growing 100,000 E. coli.  She has been treated effectively with antibiotics.  She states she had continued complaints of pneumaturia which prompted urology reengagement.  Given concern for recurrent colovesicular fistula, CT cystogram was performed on 08/15/2020 which revealed persistent colovesicular fistula arising from the dome of the bladder extending into the sigmoid colon along with a 8 x 4 x 4 cm abscess adjacent to the bladder and sigmoid colon.  There is evidence of free air within the abdomen.  There is also a subcutaneous fluid collection measuring 8 x 7 x 2 0.6 x 5.5 cm in the anterior pelvic wall.  She has a significant smoking history approximately pack a day for 40 years.  She also has a history of sporadic arthritis and has been on immunosuppressive therapy.   Past Medical History:  Diagnosis Date  . Anemia    . Anxiety   . Arthritis    Psoriatic  . Bipolar disorder ([place])   . CKD (chronic kidney disease), stage III ([place])   . Colovesical fistula   . Depression   . DM type 2 (diabetes mellitus, type 2) ([place]) 11/14/2019  . Dyspnea   . Headache   . Neuropathy   . NSTEMI (non-ST elevated myocardial infarction) ([place]) 11/14/2019  . OSA (obstructive sleep apnea)   . Psoriatic arthritis ([place])   . Sleep apnea   . Stroke [facility])    Memory issues, March 2021    Past Surgical History:  Procedure Laterality Date  . APPENDECTOMY    . CHOLECYSTECTOMY    . CYSTOSCOPY WITH STENT PLACEMENT N/A 07/17/2020   Procedure: CYSTOSCOPY WITH BILATERAL FIREFLY INJECTION;  Surgeon: Raynelle Bring, MD;  Location: [facility];  Service: Urology;  Laterality: N/A;  . FLEXIBLE SIGMOIDOSCOPY N/A 05/17/2020   Procedure: FLEXIBLE SIGMOIDOSCOPY;  Surgeon: Carol Ada, MD;  Location: [facility];  Service: Endoscopy;  Laterality: N/A;  . FOOT SURGERY Right   . LEFT HEART CATH AND CORONARY ANGIOGRAPHY N/A 11/14/2019   Procedure: LEFT HEART CATH AND CORONARY ANGIOGRAPHY;  Surgeon: Martinique, Peter M, MD;  Location: [place] [facility];  Service: Cardiovascular;  Laterality: N/A;  . PROCTOSCOPY N/A 07/17/2020   Procedure: RIGID PROCTOSCOPY;  Surgeon: Michael Boston, MD;  Location: [facility];  Service: General;  Laterality: N/A;  . TONSILLECTOMY    . TOTAL HIP ARTHROPLASTY Bilateral      Current Hospital Medications:  Home meds:  No current facility-administered medications on file prior to encounter.   Current Outpatient  Medications on File Prior to Encounter  Medication Sig Dispense Refill  . ALPRAZolam (XANAX) 1 MG tablet Take 1 mg by mouth 2 (two) times daily.    . Ascorbic Acid (VITAMIN C) 1000 MG tablet Take 1,000 mg by mouth at bedtime.    Marland Kitchen aspirin EC 81 MG tablet Take 1 tablet (81 mg total) by mouth daily.    Marland Kitchen b complex vitamins tablet Take 2 tablets by mouth at bedtime.    . Cholecalciferol (VITAMIN D-3) 1000 units  CAPS Take 1,000 Units by mouth daily.     . clopidogrel (PLAVIX) 75 MG tablet Take 75 mg by mouth daily.    Marland Kitchen docusate sodium (COLACE) 100 MG capsule Take 100 mg by mouth at bedtime.    . donepezil (ARICEPT) 10 MG tablet Take 10 mg by mouth daily.    Marland Kitchen escitalopram (LEXAPRO) 20 MG tablet Take 30 mg by mouth daily.   0  . furosemide (LASIX) 20 MG tablet Take 1 tablet (20 mg total) by mouth daily. 30 tablet 1  . HYDROcodone-acetaminophen (NORCO) 10-325 MG tablet Take 0.5-1 tablets by mouth every 6 (six) hours as needed for moderate pain or severe pain. 30 tablet 0  . lamoTRIgine (LAMICTAL) 150 MG tablet Take 150 mg by mouth 2 (two) times daily.   0  . nadolol (CORGARD) 20 MG tablet Take 0.5 tablets (10 mg total) by mouth daily. 90 tablet 1  . nitroGLYCERIN (NITROSTAT) 0.4 MG SL tablet Place 1 tablet (0.4 mg total) under the tongue every 5 (five) minutes as needed for chest pain. 30 tablet 0  . omeprazole (PRILOSEC) 20 MG capsule Take 1 capsule (20 mg total) by mouth 2 (two) times daily before a meal. 60 capsule 3  . ondansetron (ZOFRAN) 8 MG tablet Take 8 mg by mouth 2 (two) times daily as needed for nausea or vomiting.     . predniSONE (DELTASONE) 10 MG tablet Take 5-10 mg by mouth See admin instructions. Take 5 mg by mouth every other day, alternating with 10 mg on opposing days    . rizatriptan (MAXALT-MLT) 10 MG disintegrating tablet Take 10 mg by mouth See admin instructions. Take one tablet (10 mg) by mouth daily as needed for migraine. May repeat in 2 hours if still needed    . rosuvastatin (CRESTOR) 5 MG tablet Take 1 tablet (5 mg total) by mouth daily. 90 tablet 3  . traZODone (DESYREL) 100 MG tablet Take 2 tablets (200 mg total) by mouth at bedtime. 60 tablet 0     Scheduled Meds: . ALPRAZolam  1 mg Oral BID  . vitamin C  1,000 mg Oral QHS  . aspirin EC  81 mg Oral Daily  . cephALEXin  500 mg Oral Q12H  . Chlorhexidine Gluconate Cloth  6 each Topical Daily  . donepezil  10 mg Oral  Daily  . enoxaparin (LOVENOX) injection  40 mg Subcutaneous Q24H  . escitalopram  30 mg Oral Daily  . [START ON 08/16/2020] ferrous sulfate  325 mg Oral Q breakfast  . lamoTRIgine  150 mg Oral BID  . nadolol  10 mg Oral Daily  . pantoprazole  40 mg Oral Daily  . phosphorus  250 mg Oral BID  . polyethylene glycol  17 g Oral Daily  . predniSONE  10 mg Oral Q breakfast  . rosuvastatin  5 mg Oral q1800  . sodium chloride flush  10-40 mL Intracatheter Q12H  . sodium chloride flush  3 mL Intravenous Q12H  Continuous Infusions: PRN Meds:.acetaminophen **OR** acetaminophen, HYDROcodone-acetaminophen, ondansetron **OR** ondansetron (ZOFRAN) IV, phenazopyridine, sodium chloride flush  Allergies:  Allergies  Allergen Reactions  . Carbamazepine Other (See Comments)    Parkinsons like symptoms tremors  . Lyrica [Pregabalin] Other (See Comments)    Sedation Other reaction(s): sedated  . Sertraline Hcl Other (See Comments)    Unknown reaction    Family History  Problem Relation Age of Onset  . Breast cancer Neg Hx     Social History:  reports that she has been smoking. She has been smoking about 0.50 packs per day. She has never used smokeless tobacco. She reports previous alcohol use. She reports that she does not use drugs.  ROS: A complete review of systems was performed.  All systems are negative except for pertinent findings as noted.  Physical Exam:  Vital signs in last 24 hours: Temp:  [97.7 F (36.5 C)-98.4 F (36.9 C)] 98.4 F (36.9 C) (12/23 1303) Pulse Rate:  [59-71] 61 (12/23 1303) Resp:  [16-18] 16 (12/23 1303) BP: (116-149)/(58-87) 116/62 (12/23 1303) SpO2:  [94 %-100 %] 94 % (12/23 1303) Constitutional:  Alert and oriented, No acute distress Cardiovascular: Regular rate and rhythm Respiratory: Normal respiratory effort, Lungs clear bilaterally GI: Abdomen is soft, nontender, nondistended, no abdominal masses, no peritoneal findings GU: No CVA  tenderness Neurologic: Grossly intact, no focal deficits Psychiatric: Normal mood and affect  Laboratory Data:  Recent Labs    08/15/20 0535  WBC 9.5  HGB 8.3*  HCT 26.7*  PLT 183    Recent Labs    08/13/20 0320 08/14/20 0350 08/15/20 0535  NA 141 141 140  K 4.0 3.6 3.3*  CL 110 108 108  GLUCOSE 67* 76 93  BUN 11 13 11   CALCIUM 9.5 8.9 8.3*  CREATININE 1.35* 1.32* 1.32*     Results for orders placed or performed during the hospital encounter of 08/09/20 (from the past 24 hour(s))  SARS Coronavirus 2 by RT PCR (hospital order, performed in [place] hospital lab) Nasopharyngeal Nasopharyngeal Swab     Status: None   Collection Time: 08/14/20 11:59 PM   Specimen: Nasopharyngeal Swab  Result Value Ref Range   SARS Coronavirus 2 NEGATIVE NEGATIVE  CBC with Differential/Platelet     Status: Abnormal   Collection Time: 08/15/20  5:35 AM  Result Value Ref Range   WBC 9.5 4.0 - 10.5 K/uL   RBC 2.65 (L) 3.87 - 5.11 MIL/uL   Hemoglobin 8.3 (L) 12.0 - 15.0 g/dL   HCT 26.7 (L) 36.0 - 46.0 %   MCV 100.8 (H) 80.0 - 100.0 fL   MCH 31.3 26.0 - 34.0 pg   MCHC 31.1 30.0 - 36.0 g/dL   RDW 14.6 11.5 - 15.5 %   Platelets 183 150 - 400 K/uL   nRBC 0.0 0.0 - 0.2 %   Neutrophils Relative % 72 %   Neutro Abs 6.9 1.7 - 7.7 K/uL   Lymphocytes Relative 12 %   Lymphs Abs 1.1 0.7 - 4.0 K/uL   Monocytes Relative 7 %   Monocytes Absolute 0.7 0.1 - 1.0 K/uL   Eosinophils Relative 8 %   Eosinophils Absolute 0.8 (H) 0.0 - 0.5 K/uL   Basophils Relative 0 %   Basophils Absolute 0.0 0.0 - 0.1 K/uL   Immature Granulocytes 1 %   Abs Immature Granulocytes 0.06 0.00 - 0.07 K/uL  Basic metabolic panel     Status: Abnormal   Collection Time: 08/15/20  5:35 AM  Result Value Ref Range   Sodium 140 135 - 145 mmol/L   Potassium 3.3 (L) 3.5 - 5.1 mmol/L   Chloride 108 98 - 111 mmol/L   CO2 21 (L) 22 - 32 mmol/L   Glucose, Bld 93 70 - 99 mg/dL   BUN 11 6 - 20 mg/dL   Creatinine, Ser 1.32 (H)  0.44 - 1.00 mg/dL   Calcium 8.3 (L) 8.9 - 10.3 mg/dL   GFR, Estimated 46 (L) >60 mL/min   Anion gap 11 5 - 15  Phosphorus     Status: Abnormal   Collection Time: 08/15/20  5:35 AM  Result Value Ref Range   Phosphorus 1.9 (L) 2.5 - 4.6 mg/dL   Recent Results (from the past 240 hour(s))  Culture, Urine     Status: Abnormal   Collection Time: 08/09/20 12:37 PM   Specimen: Urine, Clean Catch  Result Value Ref Range Status   Specimen Description   Final    URINE, CLEAN CATCH Performed at [facility], [place] [address], [place], [place] [address]    Special Requests   Final    NONE Performed at [facility], [place] [address] [place], [place] [address]    Culture >=100,000 COLONIES/mL ESCHERICHIA COLI (A)  Final   Report Status 08/12/2020 FINAL  Final   Organism ID, Bacteria ESCHERICHIA COLI (A)  Final      Susceptibility   Escherichia coli - MIC*    AMPICILLIN >=32 RESISTANT Resistant     CEFAZOLIN <=4 SENSITIVE Sensitive     CEFEPIME <=0.12 SENSITIVE Sensitive     CEFTRIAXONE <=0.25 SENSITIVE Sensitive     CIPROFLOXACIN 0.5 SENSITIVE Sensitive     GENTAMICIN <=1 SENSITIVE Sensitive     IMIPENEM <=0.25 SENSITIVE Sensitive     NITROFURANTOIN <=16 SENSITIVE Sensitive     TRIMETH/SULFA <=20 SENSITIVE Sensitive     AMPICILLIN/SULBACTAM >=32 RESISTANT Resistant     PIP/TAZO <=4 SENSITIVE Sensitive     * >=100,000 COLONIES/mL ESCHERICHIA COLI  Resp Panel by RT-PCR (Flu A&B, Covid) Nasopharyngeal Swab     Status: None   Collection Time: 08/09/20  2:13 PM   Specimen: Nasopharyngeal Swab; Nasopharyngeal(NP) swabs in vial transport medium  Result Value Ref Range Status   SARS Coronavirus 2 by RT PCR NEGATIVE NEGATIVE Final    Comment: (NOTE) SARS-CoV-2 target nucleic acids are NOT DETECTED.  The SARS-CoV-2 RNA is generally detectable in upper respiratory specimens during the acute phase of infection. The lowest concentration of SARS-CoV-2 viral  copies this assay can detect is 138 copies/mL. A negative result does not preclude SARS-Cov-2 infection and should not be used as the sole basis for treatment or other patient management decisions. A negative result may occur with  improper specimen collection/handling, submission of specimen other than nasopharyngeal swab, presence of viral mutation(s) within the areas targeted by this assay, and inadequate number of viral copies(<138 copies/mL). A negative result must be combined with clinical observations, patient history, and epidemiological information. The expected result is Negative.  Fact Sheet for Patients:  EntrepreneurPulse.com.au  Fact Sheet for Healthcare Providers:  IncredibleEmployment.be  This test is no t yet approved or cleared by the [place] FDA and  has been authorized for detection and/or diagnosis of SARS-CoV-2 by FDA under an Emergency Use Authorization (EUA). This EUA will remain  in effect (meaning this test can be used) for the duration of the COVID-19 declaration under Section 564(b)(1) of the Act, 21 U.S.C.section 360bbb-3(b)(1), unless the  authorization is terminated  or revoked sooner.       Influenza A by PCR NEGATIVE NEGATIVE Final   Influenza B by PCR NEGATIVE NEGATIVE Final    Comment: (NOTE) The Xpert Xpress SARS-CoV-2/FLU/RSV plus assay is intended as an aid in the diagnosis of influenza from Nasopharyngeal swab specimens and should not be used as a sole basis for treatment. Nasal washings and aspirates are unacceptable for Xpert Xpress SARS-CoV-2/FLU/RSV testing.  Fact Sheet for Patients: EntrepreneurPulse.com.au  Fact Sheet for Healthcare Providers: IncredibleEmployment.be  This test is not yet approved or cleared by the [place] FDA and has been authorized for detection and/or diagnosis of SARS-CoV-2 by FDA under an Emergency Use Authorization (EUA). This  EUA will remain in effect (meaning this test can be used) for the duration of the COVID-19 declaration under Section 564(b)(1) of the Act, 21 U.S.C. section 360bbb-3(b)(1), unless the authorization is terminated or revoked.  Performed at [facility], [place] [address] [place], [place] [address]   SARS Coronavirus 2 by RT PCR (hospital order, performed in [facility] hospital lab) Nasopharyngeal Nasopharyngeal Swab     Status: None   Collection Time: 08/14/20 11:59 PM   Specimen: Nasopharyngeal Swab  Result Value Ref Range Status   SARS Coronavirus 2 NEGATIVE NEGATIVE Final    Comment: (NOTE) SARS-CoV-2 target nucleic acids are NOT DETECTED.  The SARS-CoV-2 RNA is generally detectable in upper and lower respiratory specimens during the acute phase of infection. The lowest concentration of SARS-CoV-2 viral copies this assay can detect is 250 copies / mL. A negative result does not preclude SARS-CoV-2 infection and should not be used as the sole basis for treatment or other patient management decisions.  A negative result may occur with improper specimen collection / handling, submission of specimen other than nasopharyngeal swab, presence of viral mutation(s) within the areas targeted by this assay, and inadequate number of viral copies (<250 copies / mL). A negative result must be combined with clinical observations, patient history, and epidemiological information.  Fact Sheet for Patients:   StrictlyIdeas.no  Fact Sheet for Healthcare Providers: BankingDealers.co.za  This test is not yet approved or  cleared by the [place] FDA and has been authorized for detection and/or diagnosis of SARS-CoV-2 by FDA under an Emergency Use Authorization (EUA).  This EUA will remain in effect (meaning this test can be used) for the duration of the COVID-19 declaration under Section 564(b)(1) of the Act, 21 U.S.C. section  360bbb-3(b)(1), unless the authorization is terminated or revoked sooner.  Performed at [facility], [place] [address], [place], [place] [address]     Renal Function: Recent Labs    08/09/20 1729 08/10/20 [id number] 08/11/20 0438 08/12/20 0428 08/13/20 0320 08/14/20 0350 08/15/20 0535  CREATININE 1.49* 1.56* 1.55* 1.43* 1.35* 1.32* 1.32*   Estimated Creatinine Clearance: 44.6 mL/min (A) (by C-G formula based on SCr of 1.32 mg/dL (H)).  Radiologic Imaging: CT ABDOMEN PELVIS W CONTRAST  Result Date: 08/15/2020 CLINICAL DATA:  Vesicointestinal fistula, persistent colovesical fistula, CT cystogram EXAM: CT ABDOMEN AND PELVIS WITH CONTRAST TECHNIQUE: Multidetector CT imaging of the abdomen and pelvis was performed using the standard protocol following bolus administration of intravenous contrast. CONTRAST:  164mL OMNIPAQUE IOHEXOL 300 MG/ML  SOLN COMPARISON:  06/04/2020 FINDINGS: Lower chest: Dependent atelectasis at lung bases Hepatobiliary: Gallbladder surgically absent.  Liver unremarkable. Pancreas: Normal appearance Spleen: Normal appearance Adrenals/Urinary Tract: Adrenal glands normal appearance. BILATERAL perinephric stranding, nonspecific. BILATERAL renal cysts. No additional renal  mass, hydronephrosis or hydroureter. No urinary tract calcifications. Foley catheter within urinary bladder. Bladder distended by contrast material. Contrast also opacifies the sigmoid colon and rectum, with contrast seen extending proximally into the descending colon. Contrast is also seen extending into a gas and fluid containing collection anterosuperior to the urinary bladder 8.0 x 4.0 x 4.4 cm image 66 consistent with perforation/abscess. Surrounding infiltrative changes. Stomach/Bowel: Diverticulosis of sigmoid colon. Prior takedown of at splenic flexure of colon. Wall thickening of sigmoid colon with adjacent infiltrative changes question diverticulitis. Sigmoid anastomosis staple line  noted. Appendix surgically absent by history. Stomach and small bowel loops normal appearance. Proximal colon normal appearance. Mild rectal wall thickening. Vascular/Lymphatic: Atherosclerotic calcification aorta, minimal. Aorta normal caliber. No adenopathy. Few normal sized pelvic lymph nodes seen. Reproductive: Uterus surgically absent. Questionable visualization of normal sized ovaries. Other: Foci of free air in the abdomen which could be related to recent procedure or perforation. Stranding in presacral fat. No definite ascites. No hernia. Subcutaneous fluid collection and anterior pelvic wall, 8.7 x 2.6 cm extending 5.5 cm length, could be sterile or infected. Musculoskeletal: Osseous demineralization. Chronic superior endplate compression fractures of T9 and T11. No acute osseous findings. IMPRESSION: 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 8.0 x 4.0 x 4.4 cm diameter abscess collection adjacent to the bladder and sigmoid colon, also opacifying with contrast from bladder fistula. Free air within the abdomen consistent with perforation. Sigmoid diverticulosis with wall thickening and adjacent infiltrative changes though these could be related to prior surgery rather than diverticulitis. Subcutaneous fluid collection and gas collection anterior pelvic wall 8.7 x 2.6 x 5.5 cm, could be sterile or infected. Aortic Atherosclerosis (ICD10-I70.0). Critical Value/emergent results were called by telephone at the time of interpretation on 08/15/2020 at 1640 hours to provider DR. Blaike Vickers, who verbally acknowledged these results. Electronically Signed   By: Lavonia Dana M.D.   On: 08/15/2020 16:41    I independently reviewed the above imaging studies.  Impression/Recommendation: 1. Recurrent colovesicular fistula: Dx with CT A/P 05/14/2020 with  diverticulitis with concern for contained perforation adjacent to the bladder as well as concern for colovesicular fistula. S/p robotic low anterior rectosigmoid resection with primary colovesical fistula repair, omental pedicle flap of bladder repair with Dr. Michael Boston on 07/17/2020.  CT cystogram 08/15/2020 with persistent colovesicular fistula arising from the dome of the bladder extending into the sigmoid colon along with a 8 x 4 x 4 cm abscess adjacent to the bladder and sigmoid colon.  There is evidence of free air within the abdomen.  2. UTI: Urine culture 08/09/2020 with 100,000 E. Coli. 3. PMH of CKD stage IIIb, cirrhotic arthritis, type 2 diabetes, bipolar disorder, hypertension  -Reviewed CT cystogram as above with evidence of recurrent colovesicular fistula as well as adjacent fluid collection and evidence of free air in the abdomen. -I spoke with Dr. Erlinda Hong to engage general surgery as she is s/p low anterior rectosigmoid resection with colovesicular repair on 07/17/2020.  -Difficult case as she has a history of cirrhotic arthritis on immunosuppressive therapy. -Defer to general surgery regarding take back to OR given persistent leak. In the least, would benefit from CT guided drain placement in abscess. -Leave foley to gravity -Continue antibiotics -Following  Matt R. Donda Friedli MD 08/15/2020, 4:49 PM  [facility]  Pager: [phone]   CC: Florencia Reasons, MD

## 2020-08-15 NOTE — Progress Notes (Addendum)
PROGRESS NOTE    Valerie [name]  [id number] DOB: 11-Jun-1961 DOA: 08/09/2020 PCP: Ophelia Shoulder, MD    Chief Complaint  Patient presents with  . Weakness    Brief Narrative:  Valerie [name] is a 59 y.o. 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  Patient was accompanied by her caregiver.  According to caregiver she is having multiple falls and unable to get up from floor, they have to call fire department twice in the last 2 days to help.  Patient is also very somnolent, sleeping randomly while talking.  Subjective:   Feeling better, fully alert, no confusion today per sister at bedside She continue to reports feeling weak, she almost fell yesterday due to weakness, nurse tech was able to catch her by report No fever   Assessment & Plan:   Active Problems:   Generalized weakness   Hypercalcemia  Acute metabolic encephalopathy, present on admission -Acute metabolic encephalopathy likely due to hypercalcemia and UTI -per caregiver "Patient is also very somnolent, sleeping randomly while talking." at home --Daughter report she was confused and very somnolent initially,  confusion appear has resolved from 12/22, she is fully alert   Hypercalcemia -Presented with generalized weakness, acute encephalopathy, constipation -TSH with normal limits, cortisol 7.8.  Vitamin D level normal at 68.  Calcitriol level unremarkable, PTH is mildly elevated at 73, PTH like protein in process, 24hr urine calcium unremarkabble -Calcium on presentation was 13.4, she does appear dehydrated, she received hydration ,  Zometa and  calcitonin nasal spray -calcium is 8.3 today, constipation resolved,  -Case discussed with endocrinology Dr. Benjiman Core who will recommend outpatient follow-up with her in 2 to 3 weeks, recommend repeat calcium  in 1 week by PCP  hypophosphatemia -phos nadir at 1 -today is 1.9, continue to replace  Hypokalemia, replace K, check mag  E. coli UTI Received Rocephin on 12/19 and then Keflex, last dose on 12/23  AKI on CKDIII b/anemia of chronic disease -Creatinine 1.78 on presentation , creatinine improved, back to baseline 1.32 ,hemoglobin stable -Monitor  Psoriatic arthritis On prednisone 10 mg daily Was on methotrexate, Plaquenil, Enbrel prior to surgery, follow-up with PCP  Per chart review there is a history of diabetes, her A1c is 5.2 Fasting blood glucose range from 67 to 116 She is not on any medication for this at home Will monitor   History of CAD status post non-STEMI March 2021, history of CVA ( per chart review, though ct head and mri in the system did not show h/o CVA) Stable,  Was on Plavix, currently on asa Continue crestor  Continue nadolol Daughter reports patient was suppose to be on asa and plavix , but plavix held due to recently surgery, Case discussed with cardiology who state initial plan was to take aspirin and Plavix a year until March 2020 Patient states she was supposed to resume Plavix soon, plan to resume Plavix if cleared by urology after cystogram today  HTN; continue nadalol  History bipolar disorder-continue Lamictal, Lexapro  H/o mild dementia, on aricept , aaox3, with mild memory deficit, at baseline today per daugher  Recent History of diverticulitis, perforation and colovesical fistula s/p repair-healing well Case discussed with urology  Dr. Abner Greenspan recommend cystogram today, Dr. Abner Greenspan will see patient in consult this afternoon  5pm Addendum: CT showed persistent colovesicular fistula with free air in abdomen, case discussed with urology  Dr Abner Greenspan  and general surgery Dr Redmond Pulling ,  will follow recommendation Daughter updated over the phone   OSA on nightly cpap Body mass index is 32.54 kg/m.   FTT/frequent falls Therapy, rehab placement   DVT prophylaxis: enoxaparin (LOVENOX) injection 40 mg Start: 08/09/20 2200   Code Status:full Family Communication: daughter at bedside on 12/22, sister at bedside with permission on December 23 Disposition:   Status is: Inpatient  Dispo: The patient is from: home              Anticipated d/c is to: SNF              Anticipated d/c date is: Likely tomorrow if cleared by urology                Consultants:   Phone conversation with endocrinology Dr. Cruzita Lederer  Urology Dr. Abner Greenspan  Procedures:   none  Antimicrobials:   As above     Objective: Vitals:   08/14/20 1702 08/14/20 2134 08/15/20 0549 08/15/20 1303  BP: 126/68 125/65 (!) 129/58 116/62  Pulse: 60 (!) 59 60 61  Resp: 16 16 16 16   Temp: 97.7 F (36.5 C) 98.1 F (36.7 C) 98.2 F (36.8 C) 98.4 F (36.9 C)  TempSrc: Oral Oral Oral Oral  SpO2: 98% 99% 96% 94%  Weight:      Height:        Intake/Output Summary (Last 24 hours) at 08/15/2020 1509 Last data filed at 08/15/2020 1400 Gross per 24 hour  Intake 600 ml  Output 700 ml  Net -100 ml   Filed Weights   08/09/20 2135  Weight: 80.7 kg    Examination:  General exam: Chronically ill appearing ,calm, NAD, AAOx3, baseline memory deficit, immediate recall 2 out of 3 Respiratory system: Clear to auscultation. Respiratory effort normal. Cardiovascular system: S1 & S2 heard, RRR. No JVD, no murmur, No pedal edema. Gastrointestinal system: Abdomen is nondistended, soft and nontender.  Normal bowel sounds heard. Central nervous system: Alert and orientedx3. No focal neurological deficits. Extremities: equal , generalized weakness, chronic venous stasis changes, no edema Skin: chronic venous stasis changes lower legs Psychiatry: Judgement and insight  appear normal. Mood & affect appropriate.     Data Reviewed: I have personally reviewed following labs and imaging studies  CBC: Recent Labs  Lab 08/09/20 1312 08/10/20 0652 08/11/20 0438 08/15/20 0535  WBC 16.6* 11.9* 8.7 9.5  NEUTROABS 13.7*  --   --  6.9  HGB 10.3* 8.5* 8.7* 8.3*  HCT 33.2* 27.6* 28.6* 26.7*  MCV 100.6* 100.7* 101.8* 100.8*  PLT 231 200 201 [id number]    Basic Metabolic Panel: Recent Labs  Lab 08/09/20 1729 08/10/20 0652 08/11/20 0438 08/12/20 0428 08/13/20 0320 08/14/20 0350 08/15/20 0535  NA  --    < > 140 138 141 141 140  K  --    < > 4.0 3.9 4.0 3.6 3.3*  CL  --    < > 107 105 110 108 108  CO2  --    < > 28 26 23 24  21*  GLUCOSE  --    < > 86 85 67* 76 93  BUN  --    < > 16 13 11 13 11   CREATININE 1.49*   < > 1.55* 1.43* 1.35* 1.32*  1.32*  CALCIUM 12.0*   < > 11.7* 10.3 9.5 8.9 8.3*  MG 2.0  --   --   --   --   --   --   PHOS 2.7  --  2.3* 1.1* 1.0* 1.8* 1.9*   < > = values in this interval not displayed.    GFR: Estimated Creatinine Clearance: 44.6 mL/min (A) (by C-G formula based on SCr of 1.32 mg/dL (H)).  Liver Function Tests: Recent Labs  Lab 08/09/20 1312 08/10/20 0652 08/11/20 0438 08/12/20 0428 08/13/20 0320 08/14/20 0350  AST 23 15  --   --   --   --   ALT 22 16  --   --   --   --   ALKPHOS 83 69  --   --   --   --   BILITOT 0.5 0.5  --   --   --   --   PROT 6.6 5.3*  --   --   --   --   ALBUMIN 3.3* 2.4* 2.4* 2.5* 2.5* 2.5*    CBG: No results for input(s): GLUCAP in the last 168 hours.   Recent Results (from the past 240 hour(s))  Culture, Urine     Status: Abnormal   Collection Time: 08/09/20 12:37 PM   Specimen: Urine, Clean Catch  Result Value Ref Range Status   Specimen Description   Final    URINE, CLEAN CATCH Performed at [facility], [place] [address] [place], [place] [address]    Special Requests   Final    NONE Performed at [facility], [place] [address],  [place], [place] [address]    Culture >=100,000 COLONIES/mL ESCHERICHIA COLI (A)  Final   Report Status 08/12/2020 FINAL  Final   Organism ID, Bacteria ESCHERICHIA COLI (A)  Final      Susceptibility   Escherichia coli - MIC*    AMPICILLIN >=32 RESISTANT Resistant     CEFAZOLIN <=4 SENSITIVE Sensitive     CEFEPIME <=0.12 SENSITIVE Sensitive     CEFTRIAXONE <=0.25 SENSITIVE Sensitive     CIPROFLOXACIN 0.5 SENSITIVE Sensitive     GENTAMICIN <=1 SENSITIVE Sensitive     IMIPENEM <=0.25 SENSITIVE Sensitive     NITROFURANTOIN <=16 SENSITIVE Sensitive     TRIMETH/SULFA <=20 SENSITIVE Sensitive     AMPICILLIN/SULBACTAM >=32 RESISTANT Resistant     PIP/TAZO <=4 SENSITIVE Sensitive     * >=100,000 COLONIES/mL ESCHERICHIA COLI  Resp Panel by RT-PCR (Flu A&B, Covid) Nasopharyngeal Swab     Status: None   Collection Time: 08/09/20  2:13 PM   Specimen: Nasopharyngeal Swab; Nasopharyngeal(NP) swabs in vial transport medium  Result Value Ref Range Status   SARS Coronavirus 2 by RT PCR NEGATIVE NEGATIVE Final    Comment: (NOTE) SARS-CoV-2 target nucleic acids are NOT DETECTED.  The SARS-CoV-2 RNA is generally detectable in upper respiratory specimens during the acute phase of infection. The lowest concentration of SARS-CoV-2 viral copies this assay can detect is 138 copies/mL. A negative result does not preclude SARS-Cov-2 infection and should not be used as the sole basis for treatment or other patient management decisions. A negative result may occur with  improper specimen collection/handling, submission of specimen other than nasopharyngeal swab, presence of viral mutation(s) within the areas targeted by this assay, and inadequate number of viral copies(<138 copies/mL). A negative result must be combined with clinical observations, patient history, and epidemiological information. The expected result is Negative.  Fact Sheet  for Patients:  EntrepreneurPulse.com.au  Fact Sheet  for Healthcare Providers:  IncredibleEmployment.be  This test is no t yet approved or cleared by the [place] FDA and  has been authorized for detection and/or diagnosis of SARS-CoV-2 by FDA under an Emergency Use Authorization (EUA). This EUA will remain  in effect (meaning this test can be used) for the duration of the COVID-19 declaration under Section 564(b)(1) of the Act, 21 U.S.C.section 360bbb-3(b)(1), unless the authorization is terminated  or revoked sooner.       Influenza A by PCR NEGATIVE NEGATIVE Final   Influenza B by PCR NEGATIVE NEGATIVE Final    Comment: (NOTE) The Xpert Xpress SARS-CoV-2/FLU/RSV plus assay is intended as an aid in the diagnosis of influenza from Nasopharyngeal swab specimens and should not be used as a sole basis for treatment. Nasal washings and aspirates are unacceptable for Xpert Xpress SARS-CoV-2/FLU/RSV testing.  Fact Sheet for Patients: EntrepreneurPulse.com.au  Fact Sheet for Healthcare Providers: IncredibleEmployment.be  This test is not yet approved or cleared by the [place] FDA and has been authorized for detection and/or diagnosis of SARS-CoV-2 by FDA under an Emergency Use Authorization (EUA). This EUA will remain in effect (meaning this test can be used) for the duration of the COVID-19 declaration under Section 564(b)(1) of the Act, 21 U.S.C. section 360bbb-3(b)(1), unless the authorization is terminated or revoked.  Performed at [facility], [place] [address], [place], [place] [address]   SARS Coronavirus 2 by RT PCR (hospital order, performed in [facility] hospital lab) Nasopharyngeal Nasopharyngeal Swab     Status: None   Collection Time: 08/14/20 11:59 PM   Specimen: Nasopharyngeal Swab  Result Value Ref Range Status   SARS Coronavirus 2 NEGATIVE NEGATIVE Final    Comment: (NOTE) SARS-CoV-2 target nucleic acids are NOT DETECTED.  The  SARS-CoV-2 RNA is generally detectable in upper and lower respiratory specimens during the acute phase of infection. The lowest concentration of SARS-CoV-2 viral copies this assay can detect is 250 copies / mL. A negative result does not preclude SARS-CoV-2 infection and should not be used as the sole basis for treatment or other patient management decisions.  A negative result may occur with improper specimen collection / handling, submission of specimen other than nasopharyngeal swab, presence of viral mutation(s) within the areas targeted by this assay, and inadequate number of viral copies (<250 copies / mL). A negative result must be combined with clinical observations, patient history, and epidemiological information.  Fact Sheet for Patients:   StrictlyIdeas.no  Fact Sheet for Healthcare Providers: BankingDealers.co.za  This test is not yet approved or  cleared by the [place] FDA and has been authorized for detection and/or diagnosis of SARS-CoV-2 by FDA under an Emergency Use Authorization (EUA).  This EUA will remain in effect (meaning this test can be used) for the duration of the COVID-19 declaration under Section 564(b)(1) of the Act, 21 U.S.C. section 360bbb-3(b)(1), unless the authorization is terminated or revoked sooner.  Performed at [facility], [place] [address], [place], [place] [address]          Radiology Studies: No results found.      Scheduled Meds: . ALPRAZolam  1 mg Oral BID  . vitamin C  1,000 mg Oral QHS  . aspirin EC  81 mg Oral Daily  . cephALEXin  500 mg Oral Q12H  . Chlorhexidine Gluconate Cloth  6 each Topical Daily  . donepezil  10 mg Oral Daily  .  enoxaparin (LOVENOX) injection  40 mg Subcutaneous Q24H  . escitalopram  30 mg Oral Daily  . [START ON 08/16/2020] ferrous sulfate  325 mg Oral Q breakfast  . lamoTRIgine  150 mg Oral BID  . nadolol  10 mg Oral Daily   . pantoprazole  40 mg Oral Daily  . phosphorus  250 mg Oral BID  . polyethylene glycol  17 g Oral Daily  . predniSONE  10 mg Oral Q breakfast  . rosuvastatin  5 mg Oral q1800  . sodium chloride flush  10-40 mL Intracatheter Q12H  . sodium chloride flush  3 mL Intravenous Q12H   Continuous Infusions:    LOS: 5 days   Time spent: 35 mins, case discussed with urology  Greater than 50% of this time was spent in counseling, explanation of diagnosis, planning of further management, and coordination of care.  I have personally reviewed and interpreted on  08/15/2020 daily labs, I reviewed all nursing notes, pharmacy notes,  vitals, pertinent old records  I have discussed plan of care as described above with RN , patient and family on 08/15/2020  Voice Recognition /Dragon dictation system was used to create this note, attempts have been made to correct errors. Please contact the author with questions and/or clarifications.   Florencia Reasons, MD PhD FACP [facility]  Available via Epic secure chat 7am-7pm for nonurgent issues Please page for urgent issues To page the attending provider between 7A-7P or the covering provider during after hours 7P-7A, please log into the web site [email or web address] and access using universal Onyx password for that web site. If you do not have the password, please call the hospital operator.    08/15/2020, 3:09 PM

## 2020-08-15 NOTE — TOC Progression Note (Signed)
Transition of Care [facility]) - Progression Note    Patient Details  Name: Valerie [name] MRN: [id number] Date of Birth: 03-Mar-1961  [facility]) CM/SW Contact  Shade Flood, LCSW Phone Number: 08/15/2020, 11:31 AM  Clinical Narrative:     TOC following. Per MD, pt is not medically stable for dc today. She is anticipating dc tomorrow. Updated Loie at [place] who states that assigned TOC can call her tomorrow to coordinate transition to [facility].  TOC will follow.  Expected Discharge Plan: [place] Barriers to Discharge: Continued Medical Work up  Expected Discharge Plan and Services Expected Discharge Plan: [place] In-house Referral: Clinical Social Work     Living arrangements for the past 2 months: [place] (Pt recently at SNF x 2 weeks then home alone x 1 week with rehospitalization)                                       Social Determinants of Health (SDOH) Interventions    Readmission Risk Interventions No flowsheet data found.

## 2020-08-16 ENCOUNTER — Encounter (HOSPITAL_COMMUNITY): Payer: Self-pay | Admitting: Internal Medicine

## 2020-08-16 ENCOUNTER — Inpatient Hospital Stay (HOSPITAL_COMMUNITY): Payer: Medicare Other

## 2020-08-16 DIAGNOSIS — K651 Peritoneal abscess: Secondary | ICD-10-CM

## 2020-08-16 DIAGNOSIS — N321 Vesicointestinal fistula: Secondary | ICD-10-CM

## 2020-08-16 DIAGNOSIS — R531 Weakness: Secondary | ICD-10-CM

## 2020-08-16 LAB — BASIC METABOLIC PANEL
Anion gap: 7 (ref 5–15)
BUN: 13 mg/dL (ref 6–20)
CO2: 24 mmol/L (ref 22–32)
Calcium: 8.2 mg/dL — ABNORMAL LOW (ref 8.9–10.3)
Chloride: 111 mmol/L (ref 98–111)
Creatinine, Ser: 1.41 mg/dL — ABNORMAL HIGH (ref 0.44–1.00)
GFR, Estimated: 43 mL/min — ABNORMAL LOW (ref >60)
Glucose, Bld: 90 mg/dL (ref 70–99)
Potassium: 3.6 mmol/L (ref 3.5–5.1)
Sodium: 142 mmol/L (ref 135–145)

## 2020-08-16 LAB — SEDIMENTATION RATE: Sed Rate: 32 mm/hr — ABNORMAL HIGH (ref 0–22)

## 2020-08-16 LAB — CBC
HCT: 27.9 % — ABNORMAL LOW (ref 36.0–46.0)
Hemoglobin: 8.6 g/dL — ABNORMAL LOW (ref 12.0–15.0)
MCH: 31 pg (ref 26.0–34.0)
MCHC: 30.8 g/dL (ref 30.0–36.0)
MCV: 100.7 fL — ABNORMAL HIGH (ref 80.0–100.0)
Platelets: 194 10*3/uL (ref 150–400)
RBC: 2.77 MIL/uL — ABNORMAL LOW (ref 3.87–5.11)
RDW: 14.4 % (ref 11.5–15.5)
WBC: 6.9 10*3/uL (ref 4.0–10.5)
nRBC: 0 % (ref 0.0–0.2)

## 2020-08-16 LAB — C-REACTIVE PROTEIN: CRP: 4.1 mg/dL — ABNORMAL HIGH (ref <1.0)

## 2020-08-16 LAB — PROTIME-INR
INR: 1.1 (ref 0.8–1.2)
Prothrombin Time: 13.8 seconds (ref 11.4–15.2)

## 2020-08-16 LAB — PHOSPHORUS: Phosphorus: 2.2 mg/dL — ABNORMAL LOW (ref 2.5–4.6)

## 2020-08-16 LAB — MAGNESIUM: Magnesium: 2.3 mg/dL (ref 1.7–2.4)

## 2020-08-16 MED ORDER — K PHOS MONO-SOD PHOS DI & MONO 155-852-130 MG PO TABS
250.0000 mg | ORAL_TABLET | Freq: Two times a day (BID) | ORAL | Status: AC
  Administered 2020-08-16 – 2020-08-18 (×4): 250 mg via ORAL
  Filled 2020-08-16 (×4): qty 1

## 2020-08-16 MED ORDER — SODIUM CHLORIDE 0.9 % IV SOLN
3.0000 g | Freq: Four times a day (QID) | INTRAVENOUS | Status: DC
  Filled 2020-08-16: qty 8

## 2020-08-16 MED ORDER — SODIUM CHLORIDE 0.9 % IV SOLN
2.0000 g | Freq: Every day | INTRAVENOUS | Status: DC
  Administered 2020-08-16 – 2020-08-18 (×3): 2 g via INTRAVENOUS
  Filled 2020-08-16 (×3): qty 2

## 2020-08-16 MED ORDER — MIDAZOLAM HCL 2 MG/2ML IJ SOLN
INTRAMUSCULAR | Status: AC
  Filled 2020-08-16: qty 2

## 2020-08-16 MED ORDER — FENTANYL CITRATE (PF) 100 MCG/2ML IJ SOLN
INTRAMUSCULAR | Status: AC
  Filled 2020-08-16: qty 4

## 2020-08-16 MED ORDER — ENOXAPARIN SODIUM 40 MG/0.4ML ~~LOC~~ SOLN
40.0000 mg | SUBCUTANEOUS | Status: DC
Start: 2020-08-17 — End: 2020-08-21
  Administered 2020-08-17 – 2020-08-21 (×5): 40 mg via SUBCUTANEOUS
  Filled 2020-08-16 (×5): qty 0.4

## 2020-08-16 MED ORDER — MIDAZOLAM HCL 2 MG/2ML IJ SOLN
INTRAMUSCULAR | Status: AC | PRN
  Administered 2020-08-16 (×2): 1 mg via INTRAVENOUS

## 2020-08-16 MED ORDER — NALOXONE HCL 0.4 MG/ML IJ SOLN
INTRAMUSCULAR | Status: AC
  Filled 2020-08-16: qty 1

## 2020-08-16 MED ORDER — METRONIDAZOLE 500 MG PO TABS
500.0000 mg | ORAL_TABLET | Freq: Three times a day (TID) | ORAL | Status: DC
  Administered 2020-08-16 – 2020-08-18 (×7): 500 mg via ORAL
  Filled 2020-08-16 (×7): qty 1

## 2020-08-16 MED ORDER — FENTANYL CITRATE (PF) 100 MCG/2ML IJ SOLN
INTRAMUSCULAR | Status: AC | PRN
Start: 2020-08-16 — End: 2020-08-16
  Administered 2020-08-16: 50 ug via INTRAVENOUS
  Administered 2020-08-16 (×2): 25 ug via INTRAVENOUS

## 2020-08-16 NOTE — Progress Notes (Signed)
Physical Therapy Treatment Patient Details Name: Valerie [name] MRN: [id number] DOB: 04/23/1961 Today's Date: 08/16/2020    History of Present Illness 59 y.o. 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 Dx of hypercalcemia, AKI.    PT Comments    General Comments: AxO x 2 pleasant following all VC's slightly delayed response General bed mobility comments: increased time and use of bed pad to complete scooting to EOB General transfer comment: first assisted from bed to [facility] 1/4 pivot without walker and 50% VC's on proper hand transfer and turn completion.  Unsteady.  Second + 2 side by side assist to amb with posterior lean and assist to transfer hands from recliner to walker.  Unsteady. General Gait Details: + 2 side by side assist with caution to watch for knee buckle and daughter closely following with recliner.  Pt tolerated amb 18 feet with increased time and much effort.  Limited by fatigue and weakness. Positioned in recliner to comfort.   Follow Up Recommendations  SNF     Equipment Recommendations  None recommended by PT    Recommendations for Other Services       Precautions / Restrictions Precautions Precautions: Fall Precaution Comments: knees buckle    Mobility  Bed Mobility Overal bed mobility: Needs Assistance Bed Mobility: Supine to Sit     Supine to sit: Mod assist;Min assist     General bed mobility comments: increased time and use of bed pad to complete scooting to EOB  Transfers Overall transfer level: Needs assistance Equipment used: Rolling walker (2 wheeled);None Transfers: Sit to/from  [employer] to Stand: Min assist Stand pivot transfers: Min assist;Mod assist       General transfer comment: first assisted from bed to [facility] 1/4 pivot without walker and 50% VC's on proper hand transfer and turn completion.  Unsteady.  Second + 2 side by side assist to amb with posterior lean and assist to transfer hands from recliner to walker.  Unsteady.  Ambulation/Gait Ambulation/Gait assistance: Mod assist;+2 safety/equipment Gait Distance (Feet): 18 Feet Assistive device: Rolling walker (2 wheeled) Gait Pattern/deviations: Step-through pattern;Decreased stride length     General Gait Details: + 2 side by side assist with caution to watch for knee buckle and daughter closely following with recliner.  Pt tolerated amb 18 feet with increased time and much effort.  Limited by fatigue and weakness.   Stairs             Wheelchair Mobility    Modified Rankin (Stroke Patients Only)       Balance                                            Cognition Arousal/Alertness: Awake/alert Behavior During Therapy: WFL for tasks assessed/performed Overall Cognitive Status: Within Functional Limits for tasks assessed                                 General Comments: AxO x 2 pleasant following all VC's slightly delayed responce  Exercises      General Comments        Pertinent Vitals/Pain Pain Assessment: No/denies pain    Home Living                      Prior Function            PT Goals (current goals can now be found in the care plan section) Progress towards PT goals: Progressing toward goals    Frequency    Min 2X/week      PT Plan Current plan remains appropriate    Co-evaluation              AM-PAC PT "6 Clicks" Mobility   Outcome Measure  Help needed turning from your back to your side while in a flat bed without using bedrails?: A Lot Help needed moving from lying on your  back to sitting on the side of a flat bed without using bedrails?: A Lot Help needed moving to and from a bed to a chair (including a wheelchair)?: A Lot Help needed standing up from a chair using your arms (e.g., wheelchair or bedside chair)?: A Lot Help needed to walk in hospital room?: A Lot Help needed climbing 3-5 steps with a railing? : Total 6 Click Score: 11    End of Session Equipment Utilized During Treatment: Gait belt Activity Tolerance: Patient limited by fatigue Patient left: in chair;with call bell/phone within reach;with chair alarm set Nurse Communication: Mobility status PT Visit Diagnosis: Unsteadiness on feet (R26.81)     Time: 6384-6659 PT Time Calculation (min) (ACUTE ONLY): 30 min  Charges:  $Gait Training: 8-22 mins $Therapeutic Activity: 8-22 mins                     Rica Koyanagi  PTA Acute  Rehabilitation Services Pager      [phone] Office      [phone]

## 2020-08-16 NOTE — Progress Notes (Signed)
Subjective/Chief Complaint: Complains of pneumaturia   Objective: Vital signs in last 24 hours: Temp:  [97.7 F (36.5 C)-98.4 F (36.9 C)] 98.4 F (36.9 C) (12/24 0509) Pulse Rate:  [53-62] 62 (12/24 0509) Resp:  [16-18] 18 (12/24 0509) BP: (109-133)/(62-66) 109/66 (12/24 0509) SpO2:  [94 %-98 %] 94 % (12/24 0509) Last BM Date: 08/15/20  Intake/Output from previous day: 12/23 0701 - 12/24 0700 In: 970 [P.O.:960; I.V.:10] Out: 2500 [Urine:2500] Intake/Output this shift: No intake/output data recorded.  General appearance: alert and cooperative Resp: clear to auscultation bilaterally Cardio: regular rate and rhythm GI: soft, nontender  Lab Results:  Recent Labs    08/15/20 0535 08/16/20 0608  WBC 9.5 6.9  HGB 8.3* 8.6*  HCT 26.7* 27.9*  PLT 183 194   BMET Recent Labs    08/15/20 0535 08/16/20 0608  NA 140 142  K 3.3* 3.6  CL 108 111  CO2 21* 24  GLUCOSE 93 90  BUN 11 13  CREATININE 1.32* 1.41*  CALCIUM 8.3* 8.2*   PT/INR Recent Labs    08/16/20 0608  LABPROT 13.8  INR 1.1   ABG No results for input(s): PHART, HCO3 in the last 72 hours.  Invalid input(s): PCO2, PO2  Studies/Results: CT ABDOMEN PELVIS W CONTRAST  Result Date: 08/15/2020 CLINICAL DATA:  Vesicointestinal fistula, persistent colovesical fistula, CT cystogram EXAM: CT ABDOMEN AND PELVIS WITH CONTRAST TECHNIQUE: Multidetector CT imaging of the abdomen and pelvis was performed using the standard protocol following bolus administration of intravenous contrast. CONTRAST:  182mL OMNIPAQUE IOHEXOL 300 MG/ML  SOLN COMPARISON:  06/04/2020 FINDINGS: Lower chest: Dependent atelectasis at lung bases Hepatobiliary: Gallbladder surgically absent.  Liver unremarkable. Pancreas: Normal appearance Spleen: Normal appearance Adrenals/Urinary Tract: Adrenal glands normal appearance. BILATERAL perinephric stranding, nonspecific. BILATERAL renal cysts. No additional renal mass, hydronephrosis or  hydroureter. No urinary tract calcifications. Foley catheter within urinary bladder. Bladder distended by contrast material. Contrast also opacifies the sigmoid colon and rectum, with contrast seen extending proximally into the descending colon. Contrast is also seen extending into a gas and fluid containing collection anterosuperior to the urinary bladder 8.0 x 4.0 x 4.4 cm image 66 consistent with perforation/abscess. Surrounding infiltrative changes. Stomach/Bowel: Diverticulosis of sigmoid colon. Prior takedown of at splenic flexure of colon. Wall thickening of sigmoid colon with adjacent infiltrative changes question diverticulitis. Sigmoid anastomosis staple line noted. Appendix surgically absent by history. Stomach and small bowel loops normal appearance. Proximal colon normal appearance. Mild rectal wall thickening. Vascular/Lymphatic: Atherosclerotic calcification aorta, minimal. Aorta normal caliber. No adenopathy. Few normal sized pelvic lymph nodes seen. Reproductive: Uterus surgically absent. Questionable visualization of normal sized ovaries. Other: Foci of free air in the abdomen which could be related to recent procedure or perforation. Stranding in presacral fat. No definite ascites. No hernia. Subcutaneous fluid collection and anterior pelvic wall, 8.7 x 2.6 cm extending 5.5 cm length, could be sterile or infected. Musculoskeletal: Osseous demineralization. Chronic superior endplate compression fractures of T9 and T11. No acute osseous findings. IMPRESSION: 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 8.0 x 4.0 x 4.4 cm diameter abscess collection adjacent to the bladder and sigmoid colon, also opacifying with contrast from bladder fistula. Free air within the abdomen consistent with perforation. Sigmoid diverticulosis with wall thickening and  adjacent infiltrative changes though these could be related to prior surgery rather than diverticulitis. Subcutaneous fluid collection and  gas collection anterior pelvic wall 8.7 x 2.6 x 5.5 cm, could be sterile or infected. Aortic Atherosclerosis (ICD10-I70.0). Critical Value/emergent results were called by telephone at the time of interpretation on 08/15/2020 at 1640 hours to provider DR. MATTHEW GAY, who verbally acknowledged these results. Electronically Signed   By: Lavonia Dana M.D.   On: 08/15/2020 16:41    Anti-infectives: Anti-infectives (From admission, onward)   Start     Dose/Rate Route Frequency Ordered Stop   08/15/20 2200  cephALEXin (KEFLEX) capsule 500 mg        500 mg Oral Every 12 hours 08/15/20 1512 08/15/20 2011   08/12/20 1800  cephALEXin (KEFLEX) capsule 500 mg  Status:  Discontinued        500 mg Oral Every 12 hours 08/12/20 1327 08/15/20 1512   08/11/20 1730  cefTRIAXone (ROCEPHIN) 1 g in sodium chloride 0.9 % 100 mL IVPB  Status:  Discontinued        1 g 200 mL/hr over 30 Minutes Intravenous Every 24 hours 08/11/20 1631 08/12/20 1327      Assessment/Plan: s/p * No surgery found * 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

## 2020-08-16 NOTE — Progress Notes (Signed)
Attempted to insert PICC in right basilic vein.No problem in accessing but PICC unable to thread beyond brachiocephalic region despite of all measures.Primary RN aware and will notify MD.If PICC still desire please refer to IR.

## 2020-08-16 NOTE — Progress Notes (Signed)
MEDICATION-RELATED CONSULT NOTE   IR Procedure Consult - Anticoagulant/Antiplatelet PTA/Inpatient Med List Review by Pharmacist    Procedure: LLQ abscess drain placement    Completed: 12/24 after 8pm    Post-Procedural bleeding risk per IR MD assessment: Standard  Antithrombotic medications on inpatient or PTA profile prior to procedure:  Lovenox 40mg  qd, last dose 12/23 at 2011    Recommended restart time per IR Post-Procedure Guidelines:  Day +1 in am   Other considerations:      Plan:  Lovenox to resume at 40mg  daily 12/25 at 10am  Minda Ditto PharmD 08/16/2020, 8:48 PM

## 2020-08-16 NOTE — Progress Notes (Signed)
PROGRESS NOTE    Valerie [name]  [id number] DOB: 04-25-1961 DOA: 08/09/2020 PCP: Ophelia Shoulder, MD    Chief Complaint  Patient presents with  . Weakness    Brief Narrative:  Murtis Ten is a 59 y.o. 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  Patient was accompanied by her caregiver.  According to caregiver she is having multiple falls and unable to get up from floor, they have to call fire department twice in the last 2 days to help.  Patient is also very somnolent, sleeping randomly while talking.  Subjective:  She denies pain, No fever, no confusion, she is to have drain placement by IR today   Assessment & Plan:   Active Problems:   Generalized weakness   Hypercalcemia  Acute metabolic encephalopathy, present on admission -Acute metabolic encephalopathy likely due to hypercalcemia and UTI -per caregiver "Patient is also very somnolent, sleeping randomly while talking." at home --Daughter report she was confused and very somnolent initially,  confusion appear has resolved from 12/22, she is fully alert  E. coli UTI Received Rocephin  then Keflex  Recent History of diverticulitis, perforation and colovesical fistula s/p repair CT cystogram showed persistent colovesicular fistula with abscess and  free air in abdomen, case discussed with urology Dr Abner Greenspan  and general surgery Dr Redmond Pulling  General surgery recommend IR drain, ID consult Will follow recommendation    AKI on CKDIII b/anemia of chronic disease -Creatinine 1.78 on presentation , creatinine improved, back to baseline 1.32 ,hemoglobin  stable -Monitor  Hypercalcemia, likely primary hyper parathyroidism ,presents on admission -Presented with generalized weakness, acute encephalopathy, constipation -TSH with normal limits, cortisol 7.8.  Vitamin D level normal at 68.  Calcitriol level unremarkable, PTH is mildly elevated at 73, PTH like protein in process, 24hr urine calcium unremarkabble -Calcium on presentation was 13.4, she does appear dehydrated, she received hydration ,  Zometa and calcitonin nasal spray -calcium is 8.3 today, constipation resolved,  -Case discussed with endocrinology Dr. Benjiman Core who will recommend outpatient follow-up with her in 2 to 3 weeks, recommend repeat calcium  in 1 week by PCP  hypophosphatemia -phos nadir at 1 -today is 1.9, continue to replace  Hypokalemia, replace K,  mag 2.3   Psoriatic arthritis On prednisone 10 mg daily Was on methotrexate, Plaquenil, Enbrel prior to surgery, follow-up with PCP  Per chart review there is a history of diabetes, her A1c is 5.2 Fasting blood glucose range from 67 to 116 She is not on any medication for this at home Will monitor   History of CAD status post non-STEMI March 2021, history of CVA ( per chart review, though ct head and mri in the system did not show h/o CVA) Stable,  Was on Plavix, currently on asa Continue crestor  Continue nadolol Daughter reports patient was suppose to be on asa and plavix , but plavix held due to recently surgery, Case discussed with cardiology who state initial plan was to take aspirin and Plavix a year until March 2020 Likely will need to continue hold plavix due to new CT ab findings above   HTN; continue nadalol  History bipolar disorder-continue Lamictal, Lexapro  H/o mild dementia, on aricept ,  aaox3, with mild memory deficit, at baseline today per daugher   OSA on nightly cpap Body mass index is 32.54 kg/m.   FTT/frequent falls Therapy, rehab placement   DVT prophylaxis: enoxaparin  (LOVENOX) injection 40 mg Start: 08/17/20 1000   Code Status:full Family Communication: daughter over the phone Disposition:   Status is: Inpatient  Dispo: The patient is from: home              Anticipated d/c is to: SNF              Anticipated d/c date is: To be determined                Consultants:   Phone conversation with endocrinology Dr. Cruzita Lederer  Urology Dr. Abner Greenspan  General surgery  Infectious disease  IR  Procedures:   none  Antimicrobials:   As above     Objective: Vitals:   08/15/20 1303 08/15/20 2106 08/16/20 0509 08/16/20 1451  BP: 116/62 133/63 109/66 111/70  Pulse: 61 (!) 53 62 (!) 54  Resp: 16 16 18 15   Temp: 98.4 F (36.9 C) 97.7 F (36.5 C) 98.4 F (36.9 C) 98.1 F (36.7 C)  TempSrc: Oral Oral Oral Oral  SpO2: 94% 98% 94% 93%  Weight:      Height:        Intake/Output Summary (Last 24 hours) at 08/16/2020 1813 Last data filed at 08/16/2020 1744 Gross per 24 hour  Intake 590 ml  Output 3000 ml  Net -2410 ml   Filed Weights   08/09/20 2135  Weight: 80.7 kg    Examination:  General exam: Chronically ill appearing ,calm, NAD, AAOx3, baseline memory deficit, immediate recall 2 out of 3 Respiratory system: Clear to auscultation. Respiratory effort normal. Cardiovascular system: S1 & S2 heard, RRR. No JVD, no murmur, No pedal edema. Gastrointestinal system: Abdomen is nondistended, soft and nontender.  Normal bowel sounds heard. Central nervous system: Alert and orientedx3. No focal neurological deficits. Extremities: equal , generalized weakness, chronic venous stasis changes, no edema Skin: chronic venous stasis changes lower legs Psychiatry: Judgement and insight appear normal. Mood & affect appropriate.     Data Reviewed: I have personally reviewed following labs and imaging studies  CBC: Recent Labs  Lab 08/10/20 0652 08/11/20 0438 08/15/20 0535 08/16/20 0608  WBC 11.9* 8.7 9.5 6.9  NEUTROABS  --   --  6.9  --    HGB 8.5* 8.7* 8.3* 8.6*  HCT 27.6* 28.6* 26.7* 27.9*  MCV 100.7* 101.8* 100.8* 100.7*  PLT 200 201 183 [id number]    Basic Metabolic Panel: Recent Labs  Lab 08/12/20 0428 08/13/20 0320 08/14/20 0350 08/15/20 0535 08/16/20 0608  NA 138 141 141 140 142  K 3.9 4.0 3.6 3.3* 3.6  CL 105 110 108 108 111  CO2 26 23 24  21* 24  GLUCOSE 85 67* 76 93 90  BUN 13 11 13 11 13   CREATININE 1.43* 1.35* 1.32* 1.32* 1.41*  CALCIUM 10.3 9.5 8.9 8.3* 8.2*  MG  --   --   --   --  2.3  PHOS 1.1* 1.0* 1.8* 1.9* 2.2*    GFR: Estimated Creatinine Clearance: 41.7 mL/min (A) (by C-G formula based on SCr of 1.41 mg/dL (H)).  Liver Function Tests: Recent Labs  Lab 08/10/20 [id number] 08/11/20 0438 08/12/20 0428 08/13/20 0320 08/14/20 0350  AST 15  --   --   --   --   ALT 16  --   --   --   --  ALKPHOS 69  --   --   --   --   BILITOT 0.5  --   --   --   --   PROT 5.3*  --   --   --   --   ALBUMIN 2.4* 2.4* 2.5* 2.5* 2.5*    CBG: No results for input(s): GLUCAP in the last 168 hours.   Recent Results (from the past 240 hour(s))  Culture, Urine     Status: Abnormal   Collection Time: 08/09/20 12:37 PM   Specimen: Urine, Clean Catch  Result Value Ref Range Status   Specimen Description   Final    URINE, CLEAN CATCH Performed at [facility], [place] [address] [place], [place] [address]    Special Requests   Final    NONE Performed at [facility], [place] [address], [place],  [address]    Culture >=100,000 COLONIES/mL ESCHERICHIA COLI (A)  Final   Report Status 08/12/2020 FINAL  Final   Organism ID, Bacteria ESCHERICHIA COLI (A)  Final      Susceptibility   Escherichia coli - MIC*    AMPICILLIN >=32 RESISTANT Resistant     CEFAZOLIN <=4 SENSITIVE Sensitive     CEFEPIME <=0.12 SENSITIVE Sensitive     CEFTRIAXONE <=0.25 SENSITIVE Sensitive     CIPROFLOXACIN 0.5 SENSITIVE Sensitive     GENTAMICIN <=1 SENSITIVE Sensitive     IMIPENEM <=0.25 SENSITIVE  Sensitive     NITROFURANTOIN <=16 SENSITIVE Sensitive     TRIMETH/SULFA <=20 SENSITIVE Sensitive     AMPICILLIN/SULBACTAM >=32 RESISTANT Resistant     PIP/TAZO <=4 SENSITIVE Sensitive     * >=100,000 COLONIES/mL ESCHERICHIA COLI  Resp Panel by RT-PCR (Flu A&B, Covid) Nasopharyngeal Swab     Status: None   Collection Time: 08/09/20  2:13 PM   Specimen: Nasopharyngeal Swab; Nasopharyngeal(NP) swabs in vial transport medium  Result Value Ref Range Status   SARS Coronavirus 2 by RT PCR NEGATIVE NEGATIVE Final    Comment: (NOTE) SARS-CoV-2 target nucleic acids are NOT DETECTED.  The SARS-CoV-2 RNA is generally detectable in upper respiratory specimens during the acute phase of infection. The lowest concentration of SARS-CoV-2 viral copies this assay can detect is 138 copies/mL. A negative result does not preclude SARS-Cov-2 infection and should not be used as the sole basis for treatment or other patient management decisions. A negative result may occur with  improper specimen collection/handling, submission of specimen other than nasopharyngeal swab, presence of viral mutation(s) within the areas targeted by this assay, and inadequate number of viral copies(<138 copies/mL). A negative result must be combined with clinical observations, patient history, and epidemiological information. The expected result is Negative.  Fact Sheet for Patients:  EntrepreneurPulse.com.au  Fact Sheet for Healthcare Providers:  IncredibleEmployment.be  This test is no t yet approved or cleared by the [place] FDA and  has been authorized for detection and/or diagnosis of SARS-CoV-2 by FDA under an Emergency Use Authorization (EUA). This EUA will remain  in effect (meaning this test can be used) for the duration of the COVID-19 declaration under Section 564(b)(1) of the Act, 21 U.S.C.section 360bbb-3(b)(1), unless the authorization is terminated  or revoked sooner.        Influenza A by PCR NEGATIVE NEGATIVE Final   Influenza B by PCR NEGATIVE NEGATIVE Final    Comment: (NOTE) The Xpert Xpress SARS-CoV-2/FLU/RSV plus assay is intended as an aid in the diagnosis of influenza from Nasopharyngeal swab specimens and  should not be used as a sole basis for treatment. Nasal washings and aspirates are unacceptable for Xpert Xpress SARS-CoV-2/FLU/RSV testing.  Fact Sheet for Patients: EntrepreneurPulse.com.au  Fact Sheet for Healthcare Providers: IncredibleEmployment.be  This test is not yet approved or cleared by the [place] FDA and has been authorized for detection and/or diagnosis of SARS-CoV-2 by FDA under an Emergency Use Authorization (EUA). This EUA will remain in effect (meaning this test can be used) for the duration of the COVID-19 declaration under Section 564(b)(1) of the Act, 21 U.S.C. section 360bbb-3(b)(1), unless the authorization is terminated or revoked.  Performed at [facility], [place] [address], [place], [place] [address]   SARS Coronavirus 2 by RT PCR (hospital order, performed in [facility] hospital lab) Nasopharyngeal Nasopharyngeal Swab     Status: None   Collection Time: 08/14/20 11:59 PM   Specimen: Nasopharyngeal Swab  Result Value Ref Range Status   SARS Coronavirus 2 NEGATIVE NEGATIVE Final    Comment: (NOTE) SARS-CoV-2 target nucleic acids are NOT DETECTED.  The SARS-CoV-2 RNA is generally detectable in upper and lower respiratory specimens during the acute phase of infection. The lowest concentration of SARS-CoV-2 viral copies this assay can detect is 250 copies / mL. A negative result does not preclude SARS-CoV-2 infection and should not be used as the sole basis for treatment or other patient management decisions.  A negative result may occur with improper specimen collection / handling, submission of specimen other than nasopharyngeal swab, presence  of viral mutation(s) within the areas targeted by this assay, and inadequate number of viral copies (<250 copies / mL). A negative result must be combined with clinical observations, patient history, and epidemiological information.  Fact Sheet for Patients:   StrictlyIdeas.no  Fact Sheet for Healthcare Providers: BankingDealers.co.za  This test is not yet approved or  cleared by the [place] FDA and has been authorized for detection and/or diagnosis of SARS-CoV-2 by FDA under an Emergency Use Authorization (EUA).  This EUA will remain in effect (meaning this test can be used) for the duration of the COVID-19 declaration under Section 564(b)(1) of the Act, 21 U.S.C. section 360bbb-3(b)(1), unless the authorization is terminated or revoked sooner.  Performed at [facility], [place] [address], [place], [place] [address]          Radiology Studies: CT ABDOMEN PELVIS W CONTRAST  Result Date: 08/15/2020 CLINICAL DATA:  Vesicointestinal fistula, persistent colovesical fistula, CT cystogram EXAM: CT ABDOMEN AND PELVIS WITH CONTRAST TECHNIQUE: Multidetector CT imaging of the abdomen and pelvis was performed using the standard protocol following bolus administration of intravenous contrast. CONTRAST:  126mL OMNIPAQUE IOHEXOL 300 MG/ML  SOLN COMPARISON:  06/04/2020 FINDINGS: Lower chest: Dependent atelectasis at lung bases Hepatobiliary: Gallbladder surgically absent.  Liver unremarkable. Pancreas: Normal appearance Spleen: Normal appearance Adrenals/Urinary Tract: Adrenal glands normal appearance. BILATERAL perinephric stranding, nonspecific. BILATERAL renal cysts. No additional renal mass, hydronephrosis or hydroureter. No urinary tract calcifications. Foley catheter within urinary bladder. Bladder distended by contrast material. Contrast also opacifies the sigmoid colon and rectum, with contrast seen extending proximally  into the descending colon. Contrast is also seen extending into a gas and fluid containing collection anterosuperior to the urinary bladder 8.0 x 4.0 x 4.4 cm image 66 consistent with perforation/abscess. Surrounding infiltrative changes. Stomach/Bowel: Diverticulosis of sigmoid colon. Prior takedown of at splenic flexure of colon. Wall thickening of sigmoid colon with adjacent infiltrative changes question diverticulitis. Sigmoid anastomosis staple line noted. Appendix surgically absent by  history. Stomach and small bowel loops normal appearance. Proximal colon normal appearance. Mild rectal wall thickening. Vascular/Lymphatic: Atherosclerotic calcification aorta, minimal. Aorta normal caliber. No adenopathy. Few normal sized pelvic lymph nodes seen. Reproductive: Uterus surgically absent. Questionable visualization of normal sized ovaries. Other: Foci of free air in the abdomen which could be related to recent procedure or perforation. Stranding in presacral fat. No definite ascites. No hernia. Subcutaneous fluid collection and anterior pelvic wall, 8.7 x 2.6 cm extending 5.5 cm length, could be sterile or infected. Musculoskeletal: Osseous demineralization. Chronic superior endplate compression fractures of T9 and T11. No acute osseous findings. IMPRESSION: 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 8.0 x 4.0 x 4.4 cm diameter abscess collection adjacent to the bladder and sigmoid colon, also opacifying with contrast from bladder fistula. Free air within the abdomen consistent with perforation. Sigmoid diverticulosis with wall thickening and adjacent infiltrative changes though these could be related to prior surgery rather than diverticulitis. Subcutaneous fluid collection and gas collection anterior pelvic wall 8.7 x 2.6 x 5.5 cm, could be sterile or infected.  Aortic Atherosclerosis (ICD10-I70.0). Critical Value/emergent results were called by telephone at the time of interpretation on 08/15/2020 at 1640 hours to provider DR. MATTHEW GAY, who verbally acknowledged these results. Electronically Signed   By: Lavonia Dana M.D.   On: 08/15/2020 16:41   [place] EKG SITE RITE  Result Date: 08/16/2020 If Site Rite image not attached, placement could not be confirmed due to current cardiac rhythm.       Scheduled Meds: . ALPRAZolam  1 mg Oral BID  . vitamin C  1,000 mg Oral QHS  . aspirin EC  81 mg Oral Daily  . Chlorhexidine Gluconate Cloth  6 each Topical Daily  . donepezil  10 mg Oral Daily  . [START ON 08/17/2020] enoxaparin (LOVENOX) injection  40 mg Subcutaneous Q24H  . escitalopram  30 mg Oral Daily  . ferrous sulfate  325 mg Oral Q breakfast  . lamoTRIgine  150 mg Oral BID  . metroNIDAZOLE  500 mg Oral Q8H  . nadolol  10 mg Oral Daily  . pantoprazole  40 mg Oral Daily  . phosphorus  250 mg Oral BID  . polyethylene glycol  17 g Oral Daily  . predniSONE  10 mg Oral Q breakfast  . rosuvastatin  5 mg Oral q1800  . sodium chloride flush  10-40 mL Intracatheter Q12H  . sodium chloride flush  3 mL Intravenous Q12H   Continuous Infusions: . cefTRIAXone (ROCEPHIN)  IV 2 g (08/16/20 1306)     LOS: 6 days   Time spent: 35 mins Greater than 50% of this time was spent in counseling, explanation of diagnosis, planning of further management, and coordination of care.  I have personally reviewed and interpreted on  08/16/2020 daily labs, I reviewed all nursing notes, pharmacy notes,  vitals, pertinent old records  I have discussed plan of care as described above with RN , patient and family on 08/16/2020  Voice Recognition /Dragon dictation system was used to create this note, attempts have been made to correct errors. Please contact the author with questions and/or clarifications.   Florencia Reasons, MD PhD FACP [facility]  Available via Epic  secure chat 7am-7pm for nonurgent issues Please page for urgent issues To page the attending provider between 7A-7P or the covering provider during after hours 7P-7A,  please log into the web site [email or web address] and access using universal [place] password for that web site. If you do not have the password, please call the hospital operator.    08/16/2020, 6:13 PM

## 2020-08-16 NOTE — H&P (Signed)
Chief Complaint: Abdominal fluid collection  Referring Physician(s): Greer Pickerel  Supervising Physician: Arne Cleveland  Patient Status: [facility] - In-pt  History of Present Illness: Valerie [name] is a 59 y.o. female with medical  Issues  including obesity, psoriatic arthritis on chronic prednisone, obstructive sleep apnea, chronic kidney disease, anemia, bipolar disorder and extensive smoking history.  She is status post robotic low anterior rectosigmoid resection with primary colovesicular fistula repair, mobilization of splenic flexure, omental pedicle flap with omentopexy of bladder repair 07/17/2020 by Dr. 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  She was readmitted about 6 days ago with complaints of fall, weakness, increasing lethargy.    She was discharged to a skilled nursing facility on November 30.    She was seen by Urology December 16 for foul-smelling urine and pneumaturia.    In the ED she was found to have elevated creatinine and UA consistent with recurrent UTI.    Urine culture grew more than 100,000 E. Coli.  so she was started on antibiotics.    She was supposed to return to rehab on Friday but again complained of pneumaturia so urology was reconsulted.    CT showed= 8.0 x 4.0 x 4.4 cm diameter abscess collection adjacent to the bladder and sigmoid colon, also opacifying with contrast from bladder fistula. Subcutaneous fluid collection and gas collection anterior pelvic wall 8.7 x 2.6 x 5.5 cm, could be sterile or infected.   We are asked to evaluate her for placement of a drain. She is NPO.  Past Medical History:  Diagnosis Date  . Anemia   . Anxiety   . Arthritis    Psoriatic  . Bipolar disorder ([place])   . CKD (chronic kidney disease), stage III ([place])   . Colovesical fistula   . Depression   . DM type 2 (diabetes mellitus, type 2) ([place]) 11/14/2019  . Dyspnea   . Headache   . Neuropathy   . NSTEMI (non-ST elevated  myocardial infarction) ([place]) 11/14/2019  . OSA (obstructive sleep apnea)   . Psoriatic arthritis ([place])   . Sleep apnea   . Stroke [facility])    Memory issues, March 2021    Past Surgical History:  Procedure Laterality Date  . APPENDECTOMY    . CHOLECYSTECTOMY    . CYSTOSCOPY WITH STENT PLACEMENT N/A 07/17/2020   Procedure: CYSTOSCOPY WITH BILATERAL FIREFLY INJECTION;  Surgeon: Raynelle Bring, MD;  Location: [facility] ORS;  Service: Urology;  Laterality: N/A;  . FLEXIBLE SIGMOIDOSCOPY N/A 05/17/2020   Procedure: FLEXIBLE SIGMOIDOSCOPY;  Surgeon: Carol Ada, MD;  Location: [facility] ENDOSCOPY;  Service: Endoscopy;  Laterality: N/A;  . FOOT SURGERY Right   . LEFT HEART CATH AND CORONARY ANGIOGRAPHY N/A 11/14/2019   Procedure: LEFT HEART CATH AND CORONARY ANGIOGRAPHY;  Surgeon: Martinique, Peter M, MD;  Location: [place] CV LAB;  Service: Cardiovascular;  Laterality: N/A;  . PROCTOSCOPY N/A 07/17/2020   Procedure: RIGID PROCTOSCOPY;  Surgeon: Michael Boston, MD;  Location: [facility] ORS;  Service: General;  Laterality: N/A;  . TONSILLECTOMY    . TOTAL HIP ARTHROPLASTY Bilateral     Allergies: Carbamazepine, Lyrica [pregabalin], and Sertraline hcl  Medications: Prior to Admission medications   Medication Sig Start Date End Date Taking? Authorizing Provider  ALPRAZolam Duanne Moron) 1 MG tablet Take 1 mg by mouth 2 (two) times daily. 11/21/19  Yes [provider]  Ascorbic Acid (VITAMIN C) 1000 MG tablet Take 1,000 mg by mouth at bedtime.   Yes [provider]  aspirin EC 81 MG tablet Take 1 tablet (81 mg total) by mouth daily. 11/14/19 11/13/20 Yes Samuella Cota, MD  b complex vitamins tablet Take 2 tablets by mouth at bedtime.   Yes [provider]  Cholecalciferol (VITAMIN D-3) 1000 units CAPS Take 1,000 Units by mouth daily.    Yes [provider]  clopidogrel (PLAVIX) 75 MG tablet Take 75 mg by mouth daily.   Yes [provider]  docusate sodium (COLACE) 100 MG  capsule Take 100 mg by mouth at bedtime.   Yes [provider]  donepezil (ARICEPT) 10 MG tablet Take 10 mg by mouth daily. 10/30/19  Yes [provider]  escitalopram (LEXAPRO) 20 MG tablet Take 30 mg by mouth daily.  09/10/15  Yes [provider]  furosemide (LASIX) 20 MG tablet Take 1 tablet (20 mg total) by mouth daily. 07/23/20  Yes Michael Boston, MD  HYDROcodone-acetaminophen (NORCO) 10-325 MG tablet Take 0.5-1 tablets by mouth every 6 (six) hours as needed for moderate pain or severe pain. 07/23/20  Yes Earnstine Regal, PA-C  lamoTRIgine (LAMICTAL) 150 MG tablet Take 150 mg by mouth 2 (two) times daily.  02/27/18  Yes [provider]  nadolol (CORGARD) 20 MG tablet Take 0.5 tablets (10 mg total) by mouth daily. 12/04/19  Yes [name], MD  nitroGLYCERIN (NITROSTAT) 0.4 MG SL tablet Place 1 tablet (0.4 mg total) under the tongue every 5 (five) minutes as needed for chest pain. 11/14/19  Yes Samuella Cota, MD  omeprazole (PRILOSEC) 20 MG capsule Take 1 capsule (20 mg total) by mouth 2 (two) times daily before a meal. 06/12/20 10/10/20 Yes Ghimire, Dante Gang, MD  ondansetron (ZOFRAN) 8 MG tablet Take 8 mg by mouth 2 (two) times daily as needed for nausea or vomiting.  05/02/20  Yes [provider]  predniSONE (DELTASONE) 10 MG tablet Take 5-10 mg by mouth See admin instructions. Take 5 mg by mouth every other day, alternating with 10 mg on opposing days   Yes [provider]  rizatriptan (MAXALT-MLT) 10 MG disintegrating tablet Take 10 mg by mouth See admin instructions. Take one tablet (10 mg) by mouth daily as needed for migraine. May repeat in 2 hours if still needed   Yes [provider]  rosuvastatin (CRESTOR) 5 MG tablet Take 1 tablet (5 mg total) by mouth daily. 03/21/20 07/03/20 Yes [name], MD  traZODone (DESYREL) 100 MG tablet Take 2 tablets (200 mg total) by mouth at bedtime. 07/23/20  Yes Earnstine Regal, PA-C      Family History  Problem Relation Age of Onset  . Breast cancer Neg Hx     Social History   Socioeconomic History  . Marital status: Divorced    Spouse name: Not on file  . Number of children: Not on file  . Years of education: Not on file  . Highest education level: Not on file  Occupational History  . Not on file  Tobacco Use  . Smoking status: Current Every Day Smoker    Packs/day: 0.50  . Smokeless tobacco: Never Used  Vaping Use  . Vaping Use: Never used  Substance and Sexual Activity  . Alcohol use: Not Currently  . Drug use: Never  . Sexual activity: Not on file  Other Topics Concern  . Not on file  Social History Narrative  . Not on file   Social Determinants of Health   Financial Resource Strain: Not on file  Food Insecurity: Not  on file  Transportation Needs: Not on file  Physical Activity: Not on file  Stress: Not on file  Social Connections: Not on file     Review of Systems: A 12 point ROS discussed and pertinent positives are indicated in the HPI above.  All other systems are negative.  Review of Systems  Vital Signs: BP 109/66 (BP Location: Right Arm)   Pulse 62   Temp 98.4 F (36.9 C) (Oral)   Resp 18   Ht 5\' 2"  (1.575 m)   Wt 80.7 kg   SpO2 94%   BMI 32.54 kg/m   Physical Exam Vitals reviewed.  Constitutional:      Appearance: Normal appearance.  HENT:     Head: Normocephalic and atraumatic.  Eyes:     Extraocular Movements: Extraocular movements intact.  Cardiovascular:     Rate and Rhythm: Normal rate and regular rhythm.  Pulmonary:     Effort: Pulmonary effort is normal. No respiratory distress.     Breath sounds: Normal breath sounds.  Abdominal:     General: There is no distension.     Palpations: Abdomen is soft.     Tenderness: There is no abdominal tenderness.  Musculoskeletal:        General: Normal range of motion.  Skin:    General: Skin is warm and dry.  Neurological:     General: No focal deficit  present.     Mental Status: She is alert and oriented to person, place, and time.  Psychiatric:        Mood and Affect: Mood normal.        Behavior: Behavior normal.        Thought Content: Thought content normal.        Judgment: Judgment normal.     Imaging: DG Chest 1 View  Result Date: 07/19/2020 CLINICAL DATA:  Low oxygen saturation EXAM: CHEST  1 VIEW COMPARISON:  05/14/2020, 07/18/2020 FINDINGS: Shallow inspiration with atelectasis in the lung bases. Scarring or atelectasis in the medial left upper lung, similar to prior study. Cardiac enlargement. No definite consolidation or edema. Subcutaneous emphysema in the left breast and left chest wall. Probable developing pleural effusions. IMPRESSION: 1. Shallow inspiration with atelectasis in the lung bases. 2. Developing bilateral pleural effusions. 3. Subcutaneous emphysema in the left breast and left chest wall. Electronically Signed   By: Lucienne Capers M.D.   On: 07/19/2020 05:02   CT Head Wo Contrast  Result Date: 08/09/2020 CLINICAL DATA:  Increased weakness, recent abdominal surgery EXAM: CT HEAD WITHOUT CONTRAST TECHNIQUE: Contiguous axial images were obtained from the base of the skull through the vertex without intravenous contrast. COMPARISON:  07/19/2020 FINDINGS: Brain: There is no acute intracranial hemorrhage, mass effect, or edema. Gray-white differentiation is preserved. There is no extra-axial fluid collection. Ventricles and sulci are within normal limits in size and configuration. Vascular: No hyperdense vessel or unexpected calcification. Skull: Calvarium is unremarkable. Sinuses/Orbits: No acute finding. Other: None. IMPRESSION: No acute intracranial abnormality. Electronically Signed   By: Macy Mis M.D.   On: 08/09/2020 15:05   CT HEAD WO CONTRAST  Result Date: 07/19/2020 CLINICAL DATA:  Mental status change, unknown cause. EXAM: CT HEAD WITHOUT CONTRAST TECHNIQUE: Contiguous axial images were obtained from  the base of the skull through the vertex without intravenous contrast. COMPARISON:  Head CT 06/03/2020.  Brain MRI 02/20/2020. FINDINGS: Brain: Cerebral volume is normal for age. There is no acute intracranial hemorrhage. No demarcated cortical infarct. No extra-axial fluid  collection. No evidence of intracranial mass. No midline shift. Partially empty sella turcica. Vascular: No hyperdense vessel. Skull: Normal. Negative for fracture or focal lesion. Sinuses/Orbits: Visualized orbits show no acute finding. No significant paranasal sinus disease at the imaged levels. 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 IMPRESSION: No evidence of acute intracranial abnormality. Redemonstrated partially empty sella turcica. This finding is very commonly incidental, but can be associated with idiopathic intracranial hypertension. Incidentally noted advanced left TMJ osteoarthrosis. Electronically Signed   By: Kellie Simmering DO   On: 07/19/2020 12:26   MR BRAIN WO CONTRAST  Result Date: 07/20/2020 CLINICAL DATA:  Altered mental status. EXAM: MRI HEAD WITHOUT CONTRAST TECHNIQUE: Multiplanar, multiecho pulse sequences of the brain and surrounding structures were obtained without intravenous contrast. COMPARISON:  Head CT 07/19/2020 and MRI 02/20/2020 FINDINGS: The study is mildly motion degraded. Brain: There is no evidence of an acute infarct, intracranial hemorrhage, mass, midline shift, or extra-axial fluid collection. A partially empty sella is unchanged from the prior MRI. The ventricles and sulci are normal. No significant white matter disease is seen for age. Vascular: Major intracranial vascular flow voids are preserved. Skull and upper cervical spine: Unremarkable bone marrow signal para Sinuses/Orbits: Bilateral cataract extraction. Minimal sphenoid sinus mucosal thickening. Clear mastoid air cells. Other: None. IMPRESSION: Unchanged and largely unremarkable appearance of the brain. No acute  intracranial abnormality. Electronically Signed   By: Logan Bores M.D.   On: 07/20/2020 14:39   CT ABDOMEN PELVIS W CONTRAST  Result Date: 08/15/2020 CLINICAL DATA:  Vesicointestinal fistula, persistent colovesical fistula, CT cystogram EXAM: CT ABDOMEN AND PELVIS WITH CONTRAST TECHNIQUE: Multidetector CT imaging of the abdomen and pelvis was performed using the standard protocol following bolus administration of intravenous contrast. CONTRAST:  148mL OMNIPAQUE IOHEXOL 300 MG/ML  SOLN COMPARISON:  06/04/2020 FINDINGS: Lower chest: Dependent atelectasis at lung bases Hepatobiliary: Gallbladder surgically absent.  Liver unremarkable. Pancreas: Normal appearance Spleen: Normal appearance Adrenals/Urinary Tract: Adrenal glands normal appearance. BILATERAL perinephric stranding, nonspecific. BILATERAL renal cysts. No additional renal mass, hydronephrosis or hydroureter. No urinary tract calcifications. Foley catheter within urinary bladder. Bladder distended by contrast material. Contrast also opacifies the sigmoid colon and rectum, with contrast seen extending proximally into the descending colon. Contrast is also seen extending into a gas and fluid containing collection anterosuperior to the urinary bladder 8.0 x 4.0 x 4.4 cm image 66 consistent with perforation/abscess. Surrounding infiltrative changes. Stomach/Bowel: Diverticulosis of sigmoid colon. Prior takedown of at splenic flexure of colon. Wall thickening of sigmoid colon with adjacent infiltrative changes question diverticulitis. Sigmoid anastomosis staple line noted. Appendix surgically absent by history. Stomach and small bowel loops normal appearance. Proximal colon normal appearance. Mild rectal wall thickening. Vascular/Lymphatic: Atherosclerotic calcification aorta, minimal. Aorta normal caliber. No adenopathy. Few normal sized pelvic lymph nodes seen. Reproductive: Uterus surgically absent. Questionable visualization of normal sized ovaries.  Other: Foci of free air in the abdomen which could be related to recent procedure or perforation. Stranding in presacral fat. No definite ascites. No hernia. Subcutaneous fluid collection and anterior pelvic wall, 8.7 x 2.6 cm extending 5.5 cm length, could be sterile or infected. Musculoskeletal: Osseous demineralization. Chronic superior endplate compression fractures of T9 and T11. No acute osseous findings. IMPRESSION: 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 8.0 x 4.0 x 4.4 cm diameter abscess collection adjacent to the bladder and sigmoid colon, also opacifying  with contrast from bladder fistula. Free air within the abdomen consistent with perforation. Sigmoid diverticulosis with wall thickening and adjacent infiltrative changes though these could be related to prior surgery rather than diverticulitis. Subcutaneous fluid collection and gas collection anterior pelvic wall 8.7 x 2.6 x 5.5 cm, could be sterile or infected. Aortic Atherosclerosis (ICD10-I70.0). Critical Value/emergent results were called by telephone at the time of interpretation on 08/15/2020 at 1640 hours to provider DR. MATTHEW GAY, who verbally acknowledged these results. Electronically Signed   By: Lavonia Dana M.D.   On: 08/15/2020 16:41   DG Cystogram  Result Date: 07/23/2020 CLINICAL DATA:  History of repair of colovesical fistula. EXAM: CYSTOGRAM TECHNIQUE: 150 mL Cysto-Hypaque 30% was infused into the bladder through the existing Foley catheter by drip infusion. Serial spot images were obtained during bladder filling and post draining. FLUOROSCOPY TIME:  Fluoroscopy Time:  1 minutes and 40 seconds Radiation Exposure Index (if provided by the fluoroscopic device): 48.8 mGy Number of Acquired Spot Images: 3 COMPARISON:  CT scan 06/04/2020 FINDINGS: The bladder is unremarkable. No  filling defects or extravasation of contrast is identified. IMPRESSION: Normal cystogram. No extravasation of contrast to suggest persistent fistula or postoperative leakage. Electronically Signed   By: Marijo Sanes M.D.   On: 07/23/2020 10:16   DG Chest Portable 1 View  Result Date: 08/09/2020 CLINICAL DATA:  Weakness EXAM: PORTABLE CHEST 1 VIEW COMPARISON:  None. FINDINGS: Stable cardiomediastinal contours with top-normal heart size. No new consolidation or edema. Mild chronic interstitial prominence. No pleural effusion or pneumothorax. IMPRESSION: No acute process in the chest. Electronically Signed   By: Macy Mis M.D.   On: 08/09/2020 14:51   DG CHEST PORT 1 VIEW  Result Date: 07/22/2020 CLINICAL DATA:  Hypoxia EXAM: PORTABLE CHEST 1 VIEW COMPARISON:  07/19/2020 chest radiograph. FINDINGS: Right PICC terminates over the cavoatrial junction. Stable cardiomediastinal silhouette with top-normal heart size. No pneumothorax. No pleural effusion. Improved lung volumes. No pulmonary edema. Minimal residual atelectasis at the left lung base, improved. IMPRESSION: Improved lung volumes with minimal residual left basilar atelectasis. Electronically Signed   By: Ilona Sorrel M.D.   On: 07/22/2020 08:49   DG CHEST PORT 1 VIEW  Result Date: 07/18/2020 CLINICAL DATA:  59 year old female with right PICC line. EXAM: PORTABLE CHEST 1 VIEW COMPARISON:  05/14/2020 FINDINGS: No cardiomegaly. Obscuration of the left superior mediastinum due to linear opacification. Interval insertion right upper extremity peripherally inserted central catheter with the catheter tip at the cavoatrial junction. No pleural effusion. No pneumothorax. The visualized skeletal structures are unremarkable. IMPRESSION: 1. Streaky linear opacity in the medial aspect left upper lobe. Differential diagnosis is broad, however after recent intubation, sequela of aspiration with possible superimposed infection is most likely. Mucous plugging  with left upper lobe atelectasis could appear similarly. Widening of the mediastinum due to etiology such as mediastinal hemorrhage is least likely. If there is any clinical concern for mediastinal process, recommend CT chest. 2. Right upper extremity PICC line in place with the tip at the cavoatrial junction. These results will be called to the ordering clinician or representative by the Radiologist Assistant, and communication documented in the PACS or [employer]. Ruthann Cancer, MD [facility] Electronically Signed   By: Ruthann Cancer MD   On: 07/18/2020 11:38   [place] EKG SITE RITE  Result Date: 08/16/2020 If Site Rite image not attached, placement could not be confirmed due to current cardiac rhythm.   Labs:  CBC: Recent Labs    08/10/20 0652 08/11/20 0438 08/15/20 0535 08/16/20 0608  WBC 11.9* 8.7 9.5 6.9  HGB 8.5* 8.7* 8.3* 8.6*  HCT 27.6* 28.6* 26.7* 27.9*  PLT 200 201 183 194    COAGS: Recent Labs    08/16/20 0608  INR 1.1    BMP: Recent Labs    11/14/19 0002 02/20/20 1138 05/14/20 1514 05/15/20 0210 06/03/20 1339 08/13/20 0320 08/14/20 0350 08/15/20 0535 08/16/20 0608  NA 143 137 136 135   < > 141 141 140 142  K 5.1 4.0 4.1 3.7   < > 4.0 3.6 3.3* 3.6  CL 108 103 95* 102   < > 110 108 108 111  CO2 30 23 30 27    < > 23 24 21* 24  GLUCOSE 127* 113* 106* 87   < > 67* 76 93 90  BUN 26* 18 16 15    < > 11 13 11 13   CALCIUM 10.2 9.8 12.9* 11.2*   < > 9.5 8.9 8.3* 8.2*  CREATININE 1.62* 1.73* 1.86* 1.67*   < > 1.35* 1.32* 1.32* 1.41*  GFRNONAA 34* 32* 29* 33*   < > 45* 46* 46* 43*  GFRAA 40* 37* 33* 38*  --   --   --   --   --    < > = values in this interval not displayed.    LIVER FUNCTION TESTS: Recent Labs    07/17/20 0645 07/19/20 0906 08/09/20 1312 08/10/20 0652 08/11/20 0438 08/12/20 0428 08/13/20 0320 08/14/20 0350  BILITOT 0.4 0.9 0.5 0.5  --   --   --   --   AST 18 17 23 15   --    --   --   --   ALT 12 14 22 16   --   --   --   --   ALKPHOS 73 65 83 69  --   --   --   --   PROT 6.0* 6.1* 6.6 5.3*  --   --   --   --   ALBUMIN 3.1* 2.8* 3.3* 2.4* 2.4* 2.5* 2.5* 2.5*    TUMOR MARKERS: No results for input(s): AFPTM, CEA, CA199, CHROMGRNA in the last 8760 hours.  Assessment and Plan:  Status post robotic low anterior rectosigmoid resection with primary colovesicular fistula repair, mobilization of splenic flexure, omental pedicle flap with omentopexy of bladder repair 07/17/2020 by Dr. 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  Now with 8.0 x 4.0 x 4.4 cm diameter abscess collection adjacent to the bladder and sigmoid colon, also opacifying with contrast from bladder fistula and a subcutaneous fluid collection and gas collection anterior pelvic wall 8.7 x 2.6 x 5.5 cm, could be sterile or infected.  Images reviewed by Dr. Vernard Gambles. Will proceed with image guided aspiration/Drain(s) placement today.  Risks and benefits of aspirtion/drain placement was discussed with the patient including bleeding, infection, damage to adjacent structures, bowel perforation/fistula connection, and sepsis.  All of the patient's questions were answered, patient is agreeable to proceed. Consent signed and in chart.  Thank you for this interesting consult.  I greatly enjoyed meeting Vista Leazer and look forward to participating in their care.  A copy of this report was sent to the requesting provider on this date.  Electronically Signed: Murrell Redden, PA-C   08/16/2020, 2:07 PM      I spent a total of 40 Minutes in face to face in clinical consultation, greater than 50% of which  was counseling/coordinating care for abscess drain.

## 2020-08-16 NOTE — Consult Note (Addendum)
[facility]  Total days of antibiotics 5               Reason for Consult:intra-abdominal abscess    Referring Physician: xu  Active Problems:   Generalized weakness   Hypercalcemia    HPI: Valerie [name] is a 59 y.o. female with history of diverticulitis, hx of colovesicular fistula (sep 2021), sigmoid stenosis and underwent low anterior rectosigmoid resection with colovesical fistula repair on 11/24. She was doing well until mid December started to have foul smelling urine, weakness, falls. She admitted on 12/17 for weakness subsequently identifeid with e.coli uti but CT imaging showed 8 x 4 x 4.4 cm abscess adjacent to bllader and sigmoid colon as well as subcutaneous fluid collection 8 x 7 x 2 in anterior pelvic wall.  Past Medical History:  Diagnosis Date  . Anemia   . Anxiety   . Arthritis    Psoriatic  . Bipolar disorder (HCC)   . CKD (chronic kidney disease), stage III (HCC)   . Colovesical fistula   . Depression   . DM type 2 (diabetes mellitus, type 2) (HCC) 11/14/2019  . Dyspnea   . Headache   . Neuropathy   . NSTEMI (non-ST elevated myocardial infarction) (HCC) 11/14/2019  . OSA (obstructive sleep apnea)   . Psoriatic arthritis (HCC)   . Sleep apnea   . Stroke [facility](HCC)    Memory issues, March 2021    Allergies:  Allergies  Allergen Reactions  . Carbamazepine Other (See Comments)    Parkinsons like symptoms tremors  . Lyrica [Pregabalin] Other (See Comments)    Sedation Other reaction(s): sedated  . Sertraline Hcl Other (See Comments)    Unknown reaction    MEDICATIONS: . ALPRAZolam  1 mg Oral BID  . vitamin C  1,000 mg Oral QHS  . aspirin EC  81 mg Oral Daily  . Chlorhexidine Gluconate Cloth  6 each Topical Daily  . donepezil  10 mg Oral Daily  . [START ON 08/17/2020] enoxaparin (LOVENOX) injection  40 mg Subcutaneous Q24H  . escitalopram  30 mg Oral Daily  . ferrous sulfate  325 mg Oral Q breakfast  . lamoTRIgine  150  mg Oral BID  . nadolol  10 mg Oral Daily  . pantoprazole  40 mg Oral Daily  . phosphorus  250 mg Oral BID  . polyethylene glycol  17 g Oral Daily  . predniSONE  10 mg Oral Q breakfast  . rosuvastatin  5 mg Oral q1800  . sodium chloride flush  10-40 mL Intracatheter Q12H  . sodium chloride flush  3 mL Intravenous Q12H    Social History   Tobacco Use  . Smoking status: Current Every Day Smoker    Packs/day: 0.50  . Smokeless tobacco: Never Used  Vaping Use  . Vaping Use: Never used  Substance Use Topics  . Alcohol use: Not Currently  . Drug use: Never    Family History  Problem Relation Age of Onset  . Breast cancer Neg Hx     Review of Systems - Review of Systems  Constitutional: Negative for fever, chills, diaphoresis, activity change, appetite change, fatigue and unexpected weight change.  HENT: Negative for congestion, sore throat, rhinorrhea, sneezing, trouble swallowing and sinus pressure.  Eyes: Negative for photophobia and visual disturbance.  Respiratory: Negative for cough, chest tightness, shortness of breath, wheezing and stridor.  Cardiovascular: Negative for chest pain, palpitations and leg swelling.  Gastrointestinal: +abd pain. Negative for  nausea, vomiting,  diarrhea, constipation, blood in stool, abdominal distention and anal bleeding.  Genitourinary: Negative for dysuria, hematuria, flank pain and difficulty urinating.  Musculoskeletal: Negative for myalgias, back pain, joint swelling, arthralgias and gait problem.  Skin: Negative for color change, pallor, rash and wound.  Neurological: Negative for dizziness, tremors, weakness and light-headedness.  Hematological: Negative for adenopathy. Does not bruise/bleed easily.  Psychiatric/Behavioral: Negative for behavioral problems, confusion, sleep disturbance, dysphoric mood, decreased concentration and agitation.     OBJECTIVE: Temp:  [97.7 F (36.5 C)-98.4 F (36.9 C)] 98.4 F (36.9 C) (12/24  0509) Pulse Rate:  [53-62] 62 (12/24 0509) Resp:  [16-18] 18 (12/24 0509) BP: (109-133)/(62-66) 109/66 (12/24 0509) SpO2:  [94 %-98 %] 94 % (12/24 0509) Physical Exam  Constitutional:  oriented to person, place, and time. appears well-developed and well-nourished. No distress.  HENT: [place]/AT, PERRLA, no scleral icterus Mouth/Throat: Oropharynx is clear and moist. No oropharyngeal exudate.  Cardiovascular: Normal rate, regular rhythm and normal heart sounds. Exam reveals no gallop and no friction rub.  No murmur heard.  Pulmonary/Chest: Effort normal and breath sounds normal. No respiratory distress.  has no wheezes.  Neck = supple, no nuchal rigidity Abdominal: Soft. Bowel sounds are normal.  exhibits no distension. There is no tenderness. Healed port sites  Lymphadenopathy: no cervical adenopathy. No axillary adenopathy Neurological: alert and oriented to person, place, and time.  Skin: Skin is warm and dry. No rash noted. No erythema.  Psychiatric: a normal mood and affect.  behavior is normal.    LABS: Results for orders placed or performed during the hospital encounter of 08/09/20 (from the past 48 hour(s))  Protein electrophoresis, serum     Status: Abnormal   Collection Time: 08/14/20 11:35 AM  Result Value Ref Range   Total Protein ELP 5.1 (L) 6.0 - 8.5 g/dL   Albumin ELP 2.6 (L) 2.9 - 4.4 g/dL   Alpha-1-Globulin 0.4 0.0 - 0.4 g/dL   Alpha-2-Globulin 0.6 0.4 - 1.0 g/dL   Beta Globulin 0.7 0.7 - 1.3 g/dL   Gamma Globulin 0.8 0.4 - 1.8 g/dL   M-Spike, % Not Observed Not Observed g/dL   SPE Interp. Comment     Comment: (NOTE) The SPE pattern reflects hypoalbuminemia. Evidence of monoclonal protein is not apparent. Performed At: [facility] [place], [place] [id number] Rush Farmer MD JJ:[id number]    Comment Comment     Comment: (NOTE) Protein electrophoresis scan will follow via computer, mail, or courier delivery.    Globulin, Total 2.5 2.2 -  3.9 g/dL   A/G Ratio 1.0 0.7 - 1.7  Angiotensin converting enzyme     Status: None   Collection Time: 08/14/20 11:35 AM  Result Value Ref Range   Angiotensin-Converting Enzyme 41 14 - 82 U/L    Comment: (NOTE) Performed At: [facility] [address] [place], [place] [id number] Rush Farmer MD FT:[id number]   SARS Coronavirus 2 by RT PCR (hospital order, performed in [facility] hospital lab) Nasopharyngeal Nasopharyngeal Swab     Status: None   Collection Time: 08/14/20 11:59 PM   Specimen: Nasopharyngeal Swab  Result Value Ref Range   SARS Coronavirus 2 NEGATIVE NEGATIVE    Comment: (NOTE) SARS-CoV-2 target nucleic acids are NOT DETECTED.  The SARS-CoV-2 RNA is generally detectable in upper and lower respiratory specimens during the acute phase of infection. The lowest concentration of SARS-CoV-2 viral copies this assay can detect is 250 copies / mL. A negative result does not  preclude SARS-CoV-2 infection and should not be used as the sole basis for treatment or other patient management decisions.  A negative result may occur with improper specimen collection / handling, submission of specimen other than nasopharyngeal swab, presence of viral mutation(s) within the areas targeted by this assay, and inadequate number of viral copies (<250 copies / mL). A negative result must be combined with clinical observations, patient history, and epidemiological information.  Fact Sheet for Patients:   StrictlyIdeas.no  Fact Sheet for Healthcare Providers: BankingDealers.co.za  This test is not yet approved or  cleared by the [place] FDA and has been authorized for detection and/or diagnosis of SARS-CoV-2 by FDA under an Emergency Use Authorization (EUA).  This EUA will remain in effect (meaning this test can be used) for the duration of the COVID-19 declaration under Section 564(b)(1) of the Act, 21 U.S.C. section  360bbb-3(b)(1), unless the authorization is terminated or revoked sooner.  Performed at [facility], [place] [address] [place], [place] [address]   CBC with Differential/Platelet     Status: Abnormal   Collection Time: 08/15/20  5:35 AM  Result Value Ref Range   WBC 9.5 4.0 - 10.5 K/uL   RBC 2.65 (L) 3.87 - 5.11 MIL/uL   Hemoglobin 8.3 (L) 12.0 - 15.0 g/dL   HCT 26.7 (L) 36.0 - 46.0 %   MCV 100.8 (H) 80.0 - 100.0 fL   MCH 31.3 26.0 - 34.0 pg   MCHC 31.1 30.0 - 36.0 g/dL   RDW 14.6 11.5 - 15.5 %   Platelets 183 150 - 400 K/uL   nRBC 0.0 0.0 - 0.2 %   Neutrophils Relative % 72 %   Neutro Abs 6.9 1.7 - 7.7 K/uL   Lymphocytes Relative 12 %   Lymphs Abs 1.1 0.7 - 4.0 K/uL   Monocytes Relative 7 %   Monocytes Absolute 0.7 0.1 - 1.0 K/uL   Eosinophils Relative 8 %   Eosinophils Absolute 0.8 (H) 0.0 - 0.5 K/uL   Basophils Relative 0 %   Basophils Absolute 0.0 0.0 - 0.1 K/uL   Immature Granulocytes 1 %   Abs Immature Granulocytes 0.06 0.00 - 0.07 K/uL    Comment: Performed at [facility], [place] [address] [place], [place] [id number]  Basic metabolic panel     Status: Abnormal   Collection Time: 08/15/20  5:35 AM  Result Value Ref Range   Sodium 140 135 - 145 mmol/L   Potassium 3.3 (L) 3.5 - 5.1 mmol/L   Chloride 108 98 - 111 mmol/L   CO2 21 (L) 22 - 32 mmol/L   Glucose, Bld 93 70 - 99 mg/dL    Comment: Glucose reference range applies only to samples taken after fasting for at least 8 hours.   BUN 11 6 - 20 mg/dL   Creatinine, Ser 1.32 (H) 0.44 - 1.00 mg/dL   Calcium 8.3 (L) 8.9 - 10.3 mg/dL   GFR, Estimated 46 (L) >60 mL/min    Comment: (NOTE) Calculated using the CKD-EPI Creatinine Equation (2021)    Anion gap 11 5 - 15    Comment: Performed at [facility], [place] [address] [place], [place] [address]  Phosphorus     Status: Abnormal   Collection Time: 08/15/20  5:35 AM  Result Value Ref Range   Phosphorus 1.9 (L) 2.5  - 4.6 mg/dL    Comment: Performed at [facility], [place] [address], [place],  [address]  Basic  metabolic panel     Status: Abnormal   Collection Time: 08/16/20  6:08 AM  Result Value Ref Range   Sodium 142 135 - 145 mmol/L   Potassium 3.6 3.5 - 5.1 mmol/L   Chloride 111 98 - 111 mmol/L   CO2 24 22 - 32 mmol/L   Glucose, Bld 90 70 - 99 mg/dL    Comment: Glucose reference range applies only to samples taken after fasting for at least 8 hours.   BUN 13 6 - 20 mg/dL   Creatinine, Ser 1.41 (H) 0.44 - 1.00 mg/dL   Calcium 8.2 (L) 8.9 - 10.3 mg/dL   GFR, Estimated 43 (L) >60 mL/min    Comment: (NOTE) Calculated using the CKD-EPI Creatinine Equation (2021)    Anion gap 7 5 - 15    Comment: Performed at [facility], [place] [address], [place], [place] [address]  Magnesium     Status: None   Collection Time: 08/16/20  6:08 AM  Result Value Ref Range   Magnesium 2.3 1.7 - 2.4 mg/dL    Comment: Performed at [facility], [place] [address] [place], [place] [address]  Phosphorus     Status: Abnormal   Collection Time: 08/16/20  6:08 AM  Result Value Ref Range   Phosphorus 2.2 (L) 2.5 - 4.6 mg/dL    Comment: Performed at [facility], [place] [address], [place], [place] [address]  CBC     Status: Abnormal   Collection Time: 08/16/20  6:08 AM  Result Value Ref Range   WBC 6.9 4.0 - 10.5 K/uL   RBC 2.77 (L) 3.87 - 5.11 MIL/uL   Hemoglobin 8.6 (L) 12.0 - 15.0 g/dL   HCT 27.9 (L) 36.0 - 46.0 %   MCV 100.7 (H) 80.0 - 100.0 fL   MCH 31.0 26.0 - 34.0 pg   MCHC 30.8 30.0 - 36.0 g/dL   RDW 14.4 11.5 - 15.5 %   Platelets 194 150 - 400 K/uL   nRBC 0.0 0.0 - 0.2 %    Comment: Performed at [facility], [place] [address], [place], [place] [address]  Protime-INR     Status: None   Collection Time: 08/16/20  6:08 AM  Result Value Ref Range   Prothrombin Time 13.8 11.4 - 15.2 seconds   INR 1.1 0.8 - 1.2     Comment: (NOTE) INR goal varies based on device and disease states. Performed at [facility], [place] [address], [place], [place] [address]     MICRO:     Culture, Urine (Order [id number])   Contains abnormal dataCulture, Urine Order: [id number]  Status: Final result    Visible to patient: Yes (seen)    Next appt: 08/20/2020 at 01:20 PM in Internal Medicine Philemon Kingdom, MD)   Specimen Information: Urine, Clean Catch      0 Result Notes   Component 7 d ago  Specimen Description URINE, CLEAN CATCH  Performed at [facility], [place] [address], [place], [place] [address]   Special Requests NONE  Performed at [facility], [place] [address] [place], [place] [address]   Culture >=100,000 COLONIES/mL ESCHERICHIA COLIAbnormal   Report Status 08/12/2020 FINAL   Organism ID, Bacteria ESCHERICHIA COLIAbnormal   Resulting Agency [facility]       Susceptibility   Escherichia coli    MIC    AMPICILLIN >=32 RESIST... Resistant    AMPICILLIN/SULBACTAM >=32 RESIST... Resistant  CEFAZOLIN <=4 SENSITIVE  Sensitive    CEFEPIME <=0.12 SENS... Sensitive    CEFTRIAXONE <=0.25 SENS... Sensitive    CIPROFLOXACIN 0.5 SENSITIVE  Sensitive    GENTAMICIN <=1 SENSITIVE  Sensitive    IMIPENEM <=0.25 SENS... Sensitive    NITROFURANTOIN <=16 SENSIT... Sensitive    PIP/TAZO <=4 SENSITIVE  Sensitive    TRIMETH/SULFA <=20 SENSIT... Sensitive           IMAGING: CT ABDOMEN PELVIS W CONTRAST  Result Date: 08/15/2020 CLINICAL DATA:  Vesicointestinal fistula, persistent colovesical fistula, CT cystogram EXAM: CT ABDOMEN AND PELVIS WITH CONTRAST TECHNIQUE: Multidetector CT imaging of the abdomen and pelvis was performed using the standard protocol following bolus administration of intravenous contrast. CONTRAST:  142mL OMNIPAQUE IOHEXOL 300 MG/ML  SOLN COMPARISON:  06/04/2020 FINDINGS: Lower chest: Dependent atelectasis at lung  bases Hepatobiliary: Gallbladder surgically absent.  Liver unremarkable. Pancreas: Normal appearance Spleen: Normal appearance Adrenals/Urinary Tract: Adrenal glands normal appearance. BILATERAL perinephric stranding, nonspecific. BILATERAL renal cysts. No additional renal mass, hydronephrosis or hydroureter. No urinary tract calcifications. Foley catheter within urinary bladder. Bladder distended by contrast material. Contrast also opacifies the sigmoid colon and rectum, with contrast seen extending proximally into the descending colon. Contrast is also seen extending into a gas and fluid containing collection anterosuperior to the urinary bladder 8.0 x 4.0 x 4.4 cm image 66 consistent with perforation/abscess. Surrounding infiltrative changes. Stomach/Bowel: Diverticulosis of sigmoid colon. Prior takedown of at splenic flexure of colon. Wall thickening of sigmoid colon with adjacent infiltrative changes question diverticulitis. Sigmoid anastomosis staple line noted. Appendix surgically absent by history. Stomach and small bowel loops normal appearance. Proximal colon normal appearance. Mild rectal wall thickening. Vascular/Lymphatic: Atherosclerotic calcification aorta, minimal. Aorta normal caliber. No adenopathy. Few normal sized pelvic lymph nodes seen. Reproductive: Uterus surgically absent. Questionable visualization of normal sized ovaries. Other: Foci of free air in the abdomen which could be related to recent procedure or perforation. Stranding in presacral fat. No definite ascites. No hernia. Subcutaneous fluid collection and anterior pelvic wall, 8.7 x 2.6 cm extending 5.5 cm length, could be sterile or infected. Musculoskeletal: Osseous demineralization. Chronic superior endplate compression fractures of T9 and T11. No acute osseous findings. IMPRESSION: Colovesical fistula arising from the superior margin of the bladder and extending to the sigmoid colon, with cystogram contrast material opacifying the  rectum, sigmoid colon and descending colon. 8.0 x 4.0 x 4.4 cm diameter abscess collection adjacent to the bladder and sigmoid colon, also opacifying with contrast from bladder fistula. Free air within the abdomen consistent with perforation. Sigmoid diverticulosis with wall thickening and adjacent infiltrative changes though these could be related to prior surgery rather than diverticulitis. Subcutaneous fluid collection and gas collection anterior pelvic wall 8.7 x 2.6 x 5.5 cm, could be sterile or infected. Aortic Atherosclerosis (ICD10-I70.0). Critical Value/emergent results were called by telephone at the time of interpretation on 08/15/2020 at 1640 hours to provider DR. MATTHEW GAY, who verbally acknowledged these results. Electronically Signed   By: Lavonia Dana M.D.   On: 08/15/2020 16:41    Assessment/Plan: 59yo F with colovesicula fistula with intra-abdominal abscess  - recommend to change to iv abtx of ceftriaxone plus flagyl. - please have  aerobic and anaerobic culture sent from IR aspirate today - will check sed rate and crp - will need picc line for long term antibiotics  Nausea side effect = may need prn anti-emetics from side effects of metronidazole    [facility] [place] [facility] [phone]

## 2020-08-16 NOTE — TOC Progression Note (Signed)
Transition of Care [facility]) - Progression Note    Patient Details  Name: Valerie [name] MRN: [id number] Date of Birth: [birth year]  Transition of [facility]) CM/SW Contact  Lennart Pall, LCSW Phone Number: 08/16/2020, 9:53 AM  Clinical Narrative:    Notified by MD that not medically ready for SNF transfer today.  Have alerted [facility] - they report they are not able to admit over the weekend - have alerted MD.  TOC to continue to monitor.   Expected Discharge Plan: [place] Barriers to Discharge: Continued Medical Work up  Expected Discharge Plan and Services Expected Discharge Plan: [place] In-house Referral: Clinical Social Work     Living arrangements for the past 2 months: [place] (Pt recently at SNF x 2 weeks then home alone x 1 week with rehospitalization)                                       Social Determinants of Health (SDOH) Interventions    Readmission Risk Interventions No flowsheet data found.

## 2020-08-17 ENCOUNTER — Inpatient Hospital Stay: Payer: Self-pay

## 2020-08-17 MED ORDER — SODIUM CHLORIDE 0.9% FLUSH
5.0000 mL | Freq: Three times a day (TID) | INTRAVENOUS | Status: DC
  Administered 2020-08-17 – 2020-08-21 (×13): 5 mL

## 2020-08-17 MED ORDER — LACTATED RINGERS IV SOLN: INTRAVENOUS | Status: DC

## 2020-08-17 MED ORDER — LACTATED RINGERS IV BOLUS: 1000.0000 mL | Freq: Once | INTRAVENOUS | Status: DC

## 2020-08-17 NOTE — Progress Notes (Signed)
Subjective/Chief Complaint: Feels better since drain went in   Objective: Vital signs in last 24 hours: Temp:  [98.1 F (36.7 C)-99.3 F (37.4 C)] 99.3 F (37.4 C) (12/25 0623) Pulse Rate:  [42-54] 53 (12/25 0623) Resp:  [11-19] 18 (12/25 0623) BP: (110-128)/(40-77) 110/40 (12/25 0623) SpO2:  [93 %-100 %] 98 % (12/25 0623) Last BM Date: 08/15/20  Intake/Output from previous day: 12/24 0701 - 12/25 0700 In: 460 [P.O.:360; IV Piggyback:100] Out: 1851 [Urine:500; Drains:1350; Stool:1] Intake/Output this shift: Total I/O In: 360 [P.O.:360] Out: 1351 [Drains:1350; Stool:1]  General appearance: alert and cooperative Resp: clear to auscultation bilaterally Cardio: regular rate and rhythm GI: soft, mild tenderness  Lab Results:  Recent Labs    08/15/20 0535 08/16/20 0608  WBC 9.5 6.9  HGB 8.3* 8.6*  HCT 26.7* 27.9*  PLT 183 194   BMET Recent Labs    08/15/20 0535 08/16/20 0608  NA 140 142  K 3.3* 3.6  CL 108 111  CO2 21* 24  GLUCOSE 93 90  BUN 11 13  CREATININE 1.32* 1.41*  CALCIUM 8.3* 8.2*   PT/INR Recent Labs    08/16/20 0608  LABPROT 13.8  INR 1.1   ABG No results for input(s): PHART, HCO3 in the last 72 hours.  Invalid input(s): PCO2, PO2  Studies/Results: CT ABDOMEN PELVIS W CONTRAST  Result Date: 08/15/2020 CLINICAL DATA:  Vesicointestinal fistula, persistent colovesical fistula, CT cystogram EXAM: CT ABDOMEN AND PELVIS WITH CONTRAST TECHNIQUE: Multidetector CT imaging of the abdomen and pelvis was performed using the standard protocol following bolus administration of intravenous contrast. CONTRAST:  181mL OMNIPAQUE IOHEXOL 300 MG/ML  SOLN COMPARISON:  06/04/2020 FINDINGS: Lower chest: Dependent atelectasis at lung bases Hepatobiliary: Gallbladder surgically absent.  Liver unremarkable. Pancreas: Normal appearance Spleen: Normal appearance Adrenals/Urinary Tract: Adrenal glands normal appearance. BILATERAL perinephric stranding, nonspecific.  BILATERAL renal cysts. No additional renal mass, hydronephrosis or hydroureter. No urinary tract calcifications. Foley catheter within urinary bladder. Bladder distended by contrast material. Contrast also opacifies the sigmoid colon and rectum, with contrast seen extending proximally into the descending colon. Contrast is also seen extending into a gas and fluid containing collection anterosuperior to the urinary bladder 8.0 x 4.0 x 4.4 cm image 66 consistent with perforation/abscess. Surrounding infiltrative changes. Stomach/Bowel: Diverticulosis of sigmoid colon. Prior takedown of at splenic flexure of colon. Wall thickening of sigmoid colon with adjacent infiltrative changes question diverticulitis. Sigmoid anastomosis staple line noted. Appendix surgically absent by history. Stomach and small bowel loops normal appearance. Proximal colon normal appearance. Mild rectal wall thickening. Vascular/Lymphatic: Atherosclerotic calcification aorta, minimal. Aorta normal caliber. No adenopathy. Few normal sized pelvic lymph nodes seen. Reproductive: Uterus surgically absent. Questionable visualization of normal sized ovaries. Other: Foci of free air in the abdomen which could be related to recent procedure or perforation. Stranding in presacral fat. No definite ascites. No hernia. Subcutaneous fluid collection and anterior pelvic wall, 8.7 x 2.6 cm extending 5.5 cm length, could be sterile or infected. Musculoskeletal: Osseous demineralization. Chronic superior endplate compression fractures of T9 and T11. No acute osseous findings. IMPRESSION: 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 8.0 x 4.0 x 4.4 cm diameter abscess collection adjacent to the bladder and sigmoid colon, also opacifying with contrast from bladder fistula. Free air within the abdomen consistent with  perforation. Sigmoid diverticulosis with wall thickening and adjacent infiltrative changes though these could  be related to prior surgery rather than diverticulitis. Subcutaneous fluid collection and gas collection anterior pelvic wall 8.7 x 2.6 x 5.5 cm, could be sterile or infected. Aortic Atherosclerosis (ICD10-I70.0). Critical Value/emergent results were called by telephone at the time of interpretation on 08/15/2020 at 1640 hours to provider DR. MATTHEW GAY, who verbally acknowledged these results. Electronically Signed   By: Lavonia Dana M.D.   On: 08/15/2020 16:41   [place] EKG SITE RITE  Result Date: 08/16/2020 If Site Rite image not attached, placement could not be confirmed due to current cardiac rhythm.   Anti-infectives: Anti-infectives (From admission, onward)   Start     Dose/Rate Route Frequency Ordered Stop   08/16/20 1400  metroNIDAZOLE (FLAGYL) tablet 500 mg        500 mg Oral Every 8 hours 08/16/20 1132     08/16/20 1200  Ampicillin-Sulbactam (UNASYN) 3 g in sodium chloride 0.9 % 100 mL IVPB  Status:  Discontinued        3 g 200 mL/hr over 30 Minutes Intravenous Every 6 hours 08/16/20 1123 08/16/20 1131   08/16/20 1145  cefTRIAXone (ROCEPHIN) 2 g in sodium chloride 0.9 % 100 mL IVPB        2 g 200 mL/hr over 30 Minutes Intravenous Daily 08/16/20 1131     08/15/20 2200  cephALEXin (KEFLEX) capsule 500 mg        500 mg Oral Every 12 hours 08/15/20 1512 08/15/20 2011   08/12/20 1800  cephALEXin (KEFLEX) capsule 500 mg  Status:  Discontinued        500 mg Oral Every 12 hours 08/12/20 1327 08/15/20 1512   08/11/20 1730  cefTRIAXone (ROCEPHIN) 1 g in sodium chloride 0.9 % 100 mL IVPB  Status:  Discontinued        1 g 200 mL/hr over 30 Minutes Intravenous Every 24 hours 08/11/20 1631 08/12/20 1327      Assessment/Plan: s/p * No surgery found * Advance diet  Continue IV abx for now Continue drain Hold on picc line ambulate  LOS: 7 days    Autumn Messing III 08/17/2020

## 2020-08-17 NOTE — Progress Notes (Signed)
PROGRESS NOTE    Valerie [name]  [id number] DOB: 10-15-1960 DOA: 08/09/2020 PCP: Ophelia Shoulder, MD    Chief Complaint  Patient presents with  . Weakness    Brief Narrative:  Valerie [name] is a 59 y.o. 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  Patient was accompanied by her caregiver.  According to caregiver she is having multiple falls and unable to get up from floor, they have to call fire department twice in the last 2 days to help.  Patient is also very somnolent, sleeping randomly while talking.  Subjective:  Had abdominal  drain placement by IR yesterday, denies pain, no nausea no vomiting ,no fever   Assessment & Plan:   Active Problems:   Generalized weakness   Hypercalcemia  Acute metabolic encephalopathy, present on admission -Acute metabolic encephalopathy likely due to hypercalcemia and UTI -per caregiver "Patient is also very somnolent, sleeping randomly while talking." at home --Daughter report she was confused and very somnolent initially,  confusion appear has resolved from 12/22, she is fully alert  E. coli UTI, on presentation  Received Rocephin  then Keflex  Recent History of diverticulitis, perforation and colovesical fistula s/p repair CT cystogram on 12/23 showed persistent colovesicular fistula with abscess and  free air in abdomen S/p Ir drain placement on 12/24, drain culture in process  urology / general surgery /IR/ID following Currently on IV Rocephin and oral Flagyl, she has poor IV access, not able to place picc line due to CKD, will ask IR to evaluate central line/port  placement    AKI on CKDIII b/anemia of chronic disease -Creatinine 1.78 on presentation , creatinine improved, back to baseline 1.3-1.4 ,hemoglobin stable -Monitor  Hypercalcemia, likely primary hyper parathyroidism ,presents on admission -Presented with generalized weakness, acute encephalopathy, constipation -TSH with normal limits, cortisol 7.8.  Vitamin D level normal at 68.  Calcitriol level unremarkable, PTH is mildly elevated at 73, PTH like protein in process, 24hr urine calcium unremarkabble -Calcium on presentation was 13.4, she does appear dehydrated, she received hydration ,  Zometa and calcitonin nasal spray -calcium is 8.3 today, constipation resolved,  -Case discussed with endocrinology Dr. Benjiman Core who will recommend outpatient follow-up with her in 2 to 3 weeks, recommend repeat calcium  in 1 week by PCP  hypophosphatemia -phos nadir at 1 -today is 1.9, continue to replace  Hypokalemia, replace K,  mag 2.3   Psoriatic arthritis On prednisone 10 mg daily Was on methotrexate, Plaquenil, Enbrel prior to surgery, follow-up with PCP  Per chart review there is a history of diabetes, her A1c is 5.2 Fasting blood glucose range from 67 to 116 She is not on any medication for this at home Will monitor   History of CAD status post non-STEMI March 2021, history of CVA ( per chart review, though ct head and mri in the system did not show h/o CVA) Stable,  Was on Plavix, currently on asa Continue crestor  Continue nadolol Daughter reports patient was suppose to be on asa and plavix , but plavix held due to recently surgery, Case discussed with cardiology who state initial plan was to take aspirin and Plavix a year until March 2020 Likely will need  to continue hold plavix due to new CT ab findings above   HTN; continue nadalol  History bipolar disorder-continue Lamictal, Lexapro  H/o mild dementia, on aricept , aaox3, with mild memory deficit, at baseline today  per daugher   OSA on nightly cpap Body mass index is 32.54 kg/m.   FTT/frequent falls Therapy, rehab placement   DVT prophylaxis: enoxaparin (LOVENOX) injection 40 mg Start: 08/17/20 1000   Code Status:full Family Communication: daughter over the phone Disposition:   Status is: Inpatient  Dispo: The patient is from: home              Anticipated d/c is to: SNF              Anticipated d/c date is: To be determined, needs general surgery  And ID clearance                 Consultants:   Phone conversation with endocrinology Dr. Cruzita Lederer  Urology Dr. Abner Greenspan  General surgery  Infectious disease  IR  Procedures:   IR drain placement on December 24  Antimicrobials:   As above     Objective: Vitals:   08/17/20 0216 08/17/20 0623 08/17/20 0800 08/17/20 1400  BP: (!) 112/55 (!) 110/40 (!) 118/55 (!) 89/52  Pulse: (!) 50 (!) 53 (!) 57 (!) 56  Resp: 18 18 20 20   Temp: 98.7 F (37.1 C) 99.3 F (37.4 C) 98.8 F (37.1 C) 98.1 F (36.7 C)  TempSrc: Oral Oral Oral Oral  SpO2: 98% 98% 97% 95%  Weight:      Height:        Intake/Output Summary (Last 24 hours) at 08/17/2020 1431 Last data filed at 08/17/2020 0900 Gross per 24 hour  Intake 840 ml  Output 1951 ml  Net -1111 ml   Filed Weights   08/09/20 2135  Weight: 80.7 kg    Examination:  General exam: Chronically ill appearing ,calm, NAD, AAOx3, baseline memory deficit, immediate recall 2 out of 3 Respiratory system: Clear to auscultation. Respiratory effort normal. Cardiovascular system: S1 & S2 heard, RRR. No JVD, no murmur, No pedal edema. Gastrointestinal system: Abdomen is nondistended, soft and nontender.  Normal bowel sounds heard.  Drain to left side abdomen Central nervous system: Alert and orientedx3. No focal neurological deficits. Extremities: equal , generalized weakness, chronic venous stasis changes, no edema Skin: chronic venous stasis changes lower legs Psychiatry: Judgement and insight  appear normal. Mood & affect appropriate.     Data Reviewed: I have personally reviewed following labs and imaging studies  CBC: Recent Labs  Lab 08/11/20 0438 08/15/20 0535 08/16/20 0608  WBC 8.7 9.5 6.9  NEUTROABS  --  6.9  --   HGB 8.7* 8.3* 8.6*  HCT 28.6* 26.7* 27.9*  MCV 101.8* 100.8* 100.7*  PLT 201 183 [id number]    Basic Metabolic Panel: Recent Labs  Lab 08/12/20 0428 08/13/20 0320 08/14/20 0350 08/15/20 0535 08/16/20 0608  NA 138 141 141 140 142  K 3.9 4.0 3.6 3.3* 3.6  CL 105 110 108 108 111  CO2 26 23 24  21* 24  GLUCOSE 85 67* 76 93 90  BUN 13 11 13 11 13   CREATININE 1.43* 1.35* 1.32* 1.32* 1.41*  CALCIUM 10.3 9.5 8.9 8.3* 8.2*  MG  --   --   --   --  2.3  PHOS 1.1* 1.0* 1.8* 1.9* 2.2*    GFR: Estimated Creatinine Clearance: 41.7 mL/min (A) (by C-G formula based on SCr of 1.41  mg/dL (H)).  Liver Function Tests: Recent Labs  Lab 08/11/20 0438 08/12/20 0428 08/13/20 0320 08/14/20 0350  ALBUMIN 2.4* 2.5* 2.5* 2.5*    CBG: No results for input(s): GLUCAP in the last 168 hours.   Recent Results (from the past 240 hour(s))  Culture, Urine     Status: Abnormal   Collection Time: 08/09/20 12:37 PM   Specimen: Urine, Clean Catch  Result Value Ref Range Status   Specimen Description   Final    URINE, CLEAN CATCH Performed at [facility], [place] [address] [place], [place] [address]    Special Requests   Final    NONE Performed at [facility], [place] [address] [place], [place] [address]    Culture >=100,000 COLONIES/mL ESCHERICHIA COLI (A)  Final   Report Status 08/12/2020 FINAL  Final   Organism ID, Bacteria ESCHERICHIA COLI (A)  Final      Susceptibility   Escherichia coli - MIC*    AMPICILLIN >=32 RESISTANT Resistant     CEFAZOLIN <=4 SENSITIVE Sensitive     CEFEPIME <=0.12 SENSITIVE Sensitive     CEFTRIAXONE <=0.25 SENSITIVE Sensitive     CIPROFLOXACIN 0.5 SENSITIVE Sensitive     GENTAMICIN <=1  SENSITIVE Sensitive     IMIPENEM <=0.25 SENSITIVE Sensitive     NITROFURANTOIN <=16 SENSITIVE Sensitive     TRIMETH/SULFA <=20 SENSITIVE Sensitive     AMPICILLIN/SULBACTAM >=32 RESISTANT Resistant     PIP/TAZO <=4 SENSITIVE Sensitive     * >=100,000 COLONIES/mL ESCHERICHIA COLI  Resp Panel by RT-PCR (Flu A&B, Covid) Nasopharyngeal Swab     Status: None   Collection Time: 08/09/20  2:13 PM   Specimen: Nasopharyngeal Swab; Nasopharyngeal(NP) swabs in vial transport medium  Result Value Ref Range Status   SARS Coronavirus 2 by RT PCR NEGATIVE NEGATIVE Final    Comment: (NOTE) SARS-CoV-2 target nucleic acids are NOT DETECTED.  The SARS-CoV-2 RNA is generally detectable in upper respiratory specimens during the acute phase of infection. The lowest concentration of SARS-CoV-2 viral copies this assay can detect is 138 copies/mL. A negative result does not preclude SARS-Cov-2 infection and should not be used as the sole basis for treatment or other patient management decisions. A negative result may occur with  improper specimen collection/handling, submission of specimen other than nasopharyngeal swab, presence of viral mutation(s) within the areas targeted by this assay, and inadequate number of viral copies(<138 copies/mL). A negative result must be combined with clinical observations, patient history, and epidemiological information. The expected result is Negative.  Fact Sheet for Patients:  EntrepreneurPulse.com.au  Fact Sheet for Healthcare Providers:  IncredibleEmployment.be  This test is no t yet approved or cleared by the [place] FDA and  has been authorized for detection and/or diagnosis of SARS-CoV-2 by FDA under an Emergency Use Authorization (EUA). This EUA will remain  in effect (meaning this test can be used) for the duration of the COVID-19 declaration under Section 564(b)(1) of the Act, 21 U.S.C.section 360bbb-3(b)(1), unless  the authorization is terminated  or revoked sooner.       Influenza A by PCR NEGATIVE NEGATIVE Final   Influenza B by PCR NEGATIVE NEGATIVE Final    Comment: (NOTE) The Xpert Xpress SARS-CoV-2/FLU/RSV plus assay is intended as an aid in the diagnosis of influenza from Nasopharyngeal swab specimens and should not be used as a sole basis for treatment. Nasal washings and aspirates are unacceptable for Xpert Xpress SARS-CoV-2/FLU/RSV testing.  Fact Sheet for Patients: EntrepreneurPulse.com.au  Fact Sheet for Healthcare Providers: IncredibleEmployment.be  This test is not yet approved or cleared by the [place] FDA and has been authorized for detection and/or diagnosis of SARS-CoV-2 by FDA under an Emergency Use Authorization (EUA). This EUA will remain in effect (meaning this test can be used) for the duration of the COVID-19 declaration under Section 564(b)(1) of the Act, 21 U.S.C. section 360bbb-3(b)(1), unless the authorization is terminated or revoked.  Performed at [facility], [place] [address], [place], [place] [address]   SARS Coronavirus 2 by RT PCR (hospital order, performed in [facility] hospital lab) Nasopharyngeal Nasopharyngeal Swab     Status: None   Collection Time: 08/14/20 11:59 PM   Specimen: Nasopharyngeal Swab  Result Value Ref Range Status   SARS Coronavirus 2 NEGATIVE NEGATIVE Final    Comment: (NOTE) SARS-CoV-2 target nucleic acids are NOT DETECTED.  The SARS-CoV-2 RNA is generally detectable in upper and lower respiratory specimens during the acute phase of infection. The lowest concentration of SARS-CoV-2 viral copies this assay can detect is 250 copies / mL. A negative result does not preclude SARS-CoV-2 infection and should not be used as the sole basis for treatment or other patient management decisions.  A negative result may occur with improper specimen collection / handling, submission  of specimen other than nasopharyngeal swab, presence of viral mutation(s) within the areas targeted by this assay, and inadequate number of viral copies (<250 copies / mL). A negative result must be combined with clinical observations, patient history, and epidemiological information.  Fact Sheet for Patients:   StrictlyIdeas.no  Fact Sheet for Healthcare Providers: BankingDealers.co.za  This test is not yet approved or  cleared by the [place] FDA and has been authorized for detection and/or diagnosis of SARS-CoV-2 by FDA under an Emergency Use Authorization (EUA).  This EUA will remain in effect (meaning this test can be used) for the duration of the COVID-19 declaration under Section 564(b)(1) of the Act, 21 U.S.C. section 360bbb-3(b)(1), unless the authorization is terminated or revoked sooner.  Performed at [facility], [place] [address], [place], [place] [address]   Aerobic/Anaerobic Culture (surgical/deep wound)     Status: None (Preliminary result)   Collection Time: 08/16/20  8:42 PM   Specimen: Abscess  Result Value Ref Range Status   Specimen Description   Final    ABSCESS Performed at [place] [address] [place], [place] [address]    Special Requests   Final    Normal Performed at [facility], [place] [address], [place], [place] [address]    Gram Stain   Final    ABUNDANT WBC PRESENT, PREDOMINANTLY PMN ABUNDANT GRAM POSITIVE COCCI MODERATE GRAM NEGATIVE RODS MODERATE GRAM POSITIVE RODS Performed at [facility], [place] [address], [place], [place] [address]    Culture PENDING  Incomplete   Report Status PENDING  Incomplete         Radiology Studies: CT ABDOMEN PELVIS W CONTRAST  Result Date: 08/15/2020 CLINICAL DATA:  Vesicointestinal fistula, persistent colovesical fistula, CT cystogram EXAM: CT ABDOMEN AND PELVIS WITH CONTRAST  TECHNIQUE: Multidetector CT imaging of the abdomen and pelvis was performed using the standard protocol following bolus administration of intravenous contrast. CONTRAST:  123mL OMNIPAQUE IOHEXOL 300 MG/ML  SOLN COMPARISON:  06/04/2020 FINDINGS: Lower chest: Dependent atelectasis at lung bases Hepatobiliary: Gallbladder surgically absent.  Liver unremarkable. Pancreas: Normal appearance Spleen: Normal appearance Adrenals/Urinary Tract: Adrenal glands normal appearance. BILATERAL perinephric  stranding, nonspecific. BILATERAL renal cysts. No additional renal mass, hydronephrosis or hydroureter. No urinary tract calcifications. Foley catheter within urinary bladder. Bladder distended by contrast material. Contrast also opacifies the sigmoid colon and rectum, with contrast seen extending proximally into the descending colon. Contrast is also seen extending into a gas and fluid containing collection anterosuperior to the urinary bladder 8.0 x 4.0 x 4.4 cm image 66 consistent with perforation/abscess. Surrounding infiltrative changes. Stomach/Bowel: Diverticulosis of sigmoid colon. Prior takedown of at splenic flexure of colon. Wall thickening of sigmoid colon with adjacent infiltrative changes question diverticulitis. Sigmoid anastomosis staple line noted. Appendix surgically absent by history. Stomach and small bowel loops normal appearance. Proximal colon normal appearance. Mild rectal wall thickening. Vascular/Lymphatic: Atherosclerotic calcification aorta, minimal. Aorta normal caliber. No adenopathy. Few normal sized pelvic lymph nodes seen. Reproductive: Uterus surgically absent. Questionable visualization of normal sized ovaries. Other: Foci of free air in the abdomen which could be related to recent procedure or perforation. Stranding in presacral fat. No definite ascites. No hernia. Subcutaneous fluid collection and anterior pelvic wall, 8.7 x 2.6 cm extending 5.5 cm length, could be sterile or infected.  Musculoskeletal: Osseous demineralization. Chronic superior endplate compression fractures of T9 and T11. No acute osseous findings. IMPRESSION: 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 8.0 x 4.0 x 4.4 cm diameter abscess collection adjacent to the bladder and sigmoid colon, also opacifying with contrast from bladder fistula. Free air within the abdomen consistent with perforation. Sigmoid diverticulosis with wall thickening and adjacent infiltrative changes though these could be related to prior surgery rather than diverticulitis. Subcutaneous fluid collection and gas collection anterior pelvic wall 8.7 x 2.6 x 5.5 cm, could be sterile or infected. Aortic Atherosclerosis (ICD10-I70.0). Critical Value/emergent results were called by telephone at the time of interpretation on 08/15/2020 at 1640 hours to provider DR. MATTHEW GAY, who verbally acknowledged these results. Electronically Signed   By: Lavonia Dana M.D.   On: 08/15/2020 16:41   CT IMAGE GUIDED DRAINAGE BY PERCUTANEOUS CATHETER  Result Date: 08/17/2020 CLINICAL DATA:  Colovesical fistula with abscess, drainage requested EXAM: CT GUIDED DRAINAGE OF PELVIC ABSCESS ANESTHESIA/SEDATION: Intravenous Fentanyl 173mcg and Versed 2mg  were administered as conscious sedation during continuous monitoring of the patient's level of consciousness and physiological / cardiorespiratory status by the radiology RN, with a total moderate sedation time of 15 minutes. PROCEDURE: The procedure, risks, benefits, and alternatives were explained to the patient. Questions regarding the procedure were encouraged and answered. The patient understands and consents to the procedure. Select axial scans through the pelvis was obtained. The collection was localized and an appropriate skin entry site was determined and marked. The site  was prepped with chlorhexidinein a sterile fashion, and a sterile drape was applied covering the operative field. A sterile gown and sterile gloves were used for the procedure. Local anesthesia was provided with 1% Lidocaine. Under CT fluoroscopic guidance, 18 gauge trocar needle advanced into the collection. An Amplatz wire advanced easily. Tract dilated to facilitate placement 12 French pigtail drain catheter. 5 mL of thick feculent material were aspirated, sent for Gram stain and culture. The catheter was secured externally with 0 Prolene suture and StatLock and placed to gravity drain bag. The patient tolerated the procedure well. COMPLICATIONS: None immediate FINDINGS: Gas and fluid collection interposed between the sigmoid colon and urinary bladder was again localized. 12 French pigtail drain  catheter placed as above. IMPRESSION: Technically successful CT-guided pelvic abscess drain catheter placement. Electronically Signed   By: Lucrezia Europe M.D.   On: 08/17/2020 08:19   [place] EKG SITE RITE  Result Date: 08/16/2020 If Site Rite image not attached, placement could not be confirmed due to current cardiac rhythm.       Scheduled Meds: . ALPRAZolam  1 mg Oral BID  . vitamin C  1,000 mg Oral QHS  . aspirin EC  81 mg Oral Daily  . Chlorhexidine Gluconate Cloth  6 each Topical Daily  . donepezil  10 mg Oral Daily  . enoxaparin (LOVENOX) injection  40 mg Subcutaneous Q24H  . escitalopram  30 mg Oral Daily  . ferrous sulfate  325 mg Oral Q breakfast  . lamoTRIgine  150 mg Oral BID  . metroNIDAZOLE  500 mg Oral Q8H  . nadolol  10 mg Oral Daily  . pantoprazole  40 mg Oral Daily  . phosphorus  250 mg Oral BID  . polyethylene glycol  17 g Oral Daily  . predniSONE  10 mg Oral Q breakfast  . rosuvastatin  5 mg Oral q1800  . sodium chloride flush  10-40 mL Intracatheter Q12H  . sodium chloride flush  3 mL Intravenous Q12H  . sodium chloride flush  5 mL Intracatheter Q8H   Continuous Infusions: .  cefTRIAXone (ROCEPHIN)  IV 2 g (08/17/20 1226)     LOS: 7 days   Time spent: 35 mins Greater than 50% of this time was spent in counseling, explanation of diagnosis, planning of further management, and coordination of care.  I have personally reviewed and interpreted on  08/17/2020 daily labs, I reviewed all nursing notes, pharmacy notes,  vitals, pertinent old records  I have discussed plan of care as described above with RN , patient and family on 08/17/2020  Voice Recognition /Dragon dictation system was used to create this note, attempts have been made to correct errors. Please contact the author with questions and/or clarifications.   Florencia Reasons, MD PhD FACP [facility]  Available via Epic secure chat 7am-7pm for nonurgent issues Please page for urgent issues To page the attending provider between 7A-7P or the covering provider during after hours 7P-7A, please log into the web site [email or web address] and access using universal [place] password for that web site. If you do not have the password, please call the hospital operator.    08/17/2020, 2:31 PM

## 2020-08-17 NOTE — Progress Notes (Signed)
Subjective: Pain controlled. No nausea or emesis. Tolerating foley - actually prefers this and requests to keep in place until surgery.  Objective: Vital signs in last 24 hours: Temp:  [98.1 F (36.7 C)-99.3 F (37.4 C)] 98.8 F (37.1 C) (12/25 0800) Pulse Rate:  [42-57] 57 (12/25 0800) Resp:  [11-20] 20 (12/25 0800) BP: (110-128)/(40-77) 118/55 (12/25 0800) SpO2:  [93 %-100 %] 97 % (12/25 0800)  Intake/Output from previous day: 12/24 0701 - 12/25 0700 In: 700 [P.O.:600; IV Piggyback:100] Out: 2451 [Urine:500; Drains:1950; Stool:1] Intake/Output this shift: Total I/O In: 240 [P.O.:240] Out: -   Clear yellow  Physical Exam:  General: Alert and oriented CV: RRR Lungs: Clear Abdomen: Soft, ND, NT; non-peritoneal Ext: NT, No erythema  Lab Results: Recent Labs    08/15/20 0535 08/16/20 0608  HGB 8.3* 8.6*  HCT 26.7* 27.9*   BMET Recent Labs    08/15/20 0535 08/16/20 0608  NA 140 142  K 3.3* 3.6  CL 108 111  CO2 21* 24  GLUCOSE 93 90  BUN 11 13  CREATININE 1.32* 1.41*  CALCIUM 8.3* 8.2*     Studies/Results: CT ABDOMEN PELVIS W CONTRAST  Result Date: 08/15/2020 CLINICAL DATA:  Vesicointestinal fistula, persistent colovesical fistula, CT cystogram EXAM: CT ABDOMEN AND PELVIS WITH CONTRAST TECHNIQUE: Multidetector CT imaging of the abdomen and pelvis was performed using the standard protocol following bolus administration of intravenous contrast. CONTRAST:  116mL OMNIPAQUE IOHEXOL 300 MG/ML  SOLN COMPARISON:  06/04/2020 FINDINGS: Lower chest: Dependent atelectasis at lung bases Hepatobiliary: Gallbladder surgically absent.  Liver unremarkable. Pancreas: Normal appearance Spleen: Normal appearance Adrenals/Urinary Tract: Adrenal glands normal appearance. BILATERAL perinephric stranding, nonspecific. BILATERAL renal cysts. No additional renal mass, hydronephrosis or hydroureter. No urinary tract calcifications. Foley catheter within urinary bladder. Bladder  distended by contrast material. Contrast also opacifies the sigmoid colon and rectum, with contrast seen extending proximally into the descending colon. Contrast is also seen extending into a gas and fluid containing collection anterosuperior to the urinary bladder 8.0 x 4.0 x 4.4 cm image 66 consistent with perforation/abscess. Surrounding infiltrative changes. Stomach/Bowel: Diverticulosis of sigmoid colon. Prior takedown of at splenic flexure of colon. Wall thickening of sigmoid colon with adjacent infiltrative changes question diverticulitis. Sigmoid anastomosis staple line noted. Appendix surgically absent by history. Stomach and small bowel loops normal appearance. Proximal colon normal appearance. Mild rectal wall thickening. Vascular/Lymphatic: Atherosclerotic calcification aorta, minimal. Aorta normal caliber. No adenopathy. Few normal sized pelvic lymph nodes seen. Reproductive: Uterus surgically absent. Questionable visualization of normal sized ovaries. Other: Foci of free air in the abdomen which could be related to recent procedure or perforation. Stranding in presacral fat. No definite ascites. No hernia. Subcutaneous fluid collection and anterior pelvic wall, 8.7 x 2.6 cm extending 5.5 cm length, could be sterile or infected. Musculoskeletal: Osseous demineralization. Chronic superior endplate compression fractures of T9 and T11. No acute osseous findings. IMPRESSION: 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 8.0 x 4.0 x 4.4 cm diameter abscess collection adjacent to the bladder and sigmoid colon, also opacifying with contrast from bladder fistula. Free air within the abdomen consistent with perforation. Sigmoid diverticulosis with wall thickening and adjacent infiltrative changes though these could be related to prior surgery rather than  diverticulitis. Subcutaneous fluid collection and gas collection anterior pelvic wall 8.7 x 2.6 x 5.5 cm, could be sterile or infected. Aortic Atherosclerosis (ICD10-I70.0).  Critical Value/emergent results were called by telephone at the time of interpretation on 08/15/2020 at 1640 hours to provider DR. Anna-Marie Coller, who verbally acknowledged these results. Electronically Signed   By: Ulyses Southward M.D.   On: 08/15/2020 16:41   CT IMAGE GUIDED DRAINAGE BY PERCUTANEOUS CATHETER  Result Date: 08/17/2020 CLINICAL DATA:  Colovesical fistula with abscess, drainage requested EXAM: CT GUIDED DRAINAGE OF PELVIC ABSCESS ANESTHESIA/SEDATION: Intravenous Fentanyl and Versed 2mg  were administered as conscious sedation during continuous monitoring of the patient's level of consciousness and physiological / cardiorespiratory status by the radiology RN, with a total moderate sedation time of 15 minutes. PROCEDURE: The procedure, risks, benefits, and alternatives were explained to the patient. Questions regarding the procedure were encouraged and answered. The patient understands and consents to the procedure. Select axial scans through the pelvis was obtained. The collection was localized and an appropriate skin entry site was determined and marked. The site was prepped with chlorhexidinein a sterile fashion, and a sterile drape was applied covering the operative field. A sterile gown and sterile gloves were used for the procedure. Local anesthesia was provided with 1% Lidocaine. Under CT fluoroscopic guidance, 18 gauge trocar needle advanced into the collection. An Amplatz wire advanced easily. Tract dilated to facilitate placement 12 French pigtail drain catheter. 5 mL of thick feculent material were aspirated, sent for Gram stain and culture. The catheter was secured externally with 0 Prolene suture and StatLock and placed to gravity drain bag. The patient tolerated the procedure well. COMPLICATIONS: None immediate  FINDINGS: Gas and fluid collection interposed between the sigmoid colon and urinary bladder was again localized. 12 French pigtail drain catheter placed as above. IMPRESSION: Technically successful CT-guided pelvic abscess drain catheter placement. Electronically Signed   By: M.D.   On: 08/17/2020 08:19   08/19/2020 EKG SITE RITE  Result Date: 08/16/2020 If Site Rite image not attached, placement could not be confirmed due to current cardiac rhythm.   Assessment/Plan: 1. Recurrent colovesicular fistula: Dx with CT A/P 05/14/2020 with diverticulitis with concern for contained perforation adjacent to the bladder as well as concern for colovesicular fistula. S/p robotic low anterior rectosigmoid resection with primary colovesical fistula repair, omental pedicle flap of bladder repair with Dr. 05/16/2020 on 07/17/2020.  CT cystogram 08/15/2020 with persistent colovesicular fistula arising from the dome of the bladder extending into the sigmoid colon along with a 8 x 4 x 4 cm abscess adjacent to the bladder and sigmoid colon.  There is evidence of free air within the abdomen. S/p CT guided drain on 08/16/2020 2. UTI: Urine culture 08/09/2020 with 100,000 E. Coli. 3. PMH of CKD stage IIIb, cirrhotic arthritis, type 2 diabetes, bipolar disorder, hypertension  -Noted gen surg plan for repeat repair in mid-January -Drain to gravity -Continue abx -Keep foley in place until surgery per pt preference -Following peripherally   LOS: 7 days   Matt R. Kirtis Challis MD 08/17/2020, 10:19 AM [facility]  Pager: [phone]

## 2020-08-18 DIAGNOSIS — K651 Peritoneal abscess: Secondary | ICD-10-CM

## 2020-08-18 LAB — BASIC METABOLIC PANEL
Anion gap: 9 (ref 5–15)
BUN: 14 mg/dL (ref 6–20)
CO2: 24 mmol/L (ref 22–32)
Calcium: 7.9 mg/dL — ABNORMAL LOW (ref 8.9–10.3)
Chloride: 113 mmol/L — ABNORMAL HIGH (ref 98–111)
Creatinine, Ser: 1.48 mg/dL — ABNORMAL HIGH (ref 0.44–1.00)
GFR, Estimated: 40 mL/min — ABNORMAL LOW (ref >60)
Glucose, Bld: 84 mg/dL (ref 70–99)
Potassium: 3.4 mmol/L — ABNORMAL LOW (ref 3.5–5.1)
Sodium: 146 mmol/L — ABNORMAL HIGH (ref 135–145)

## 2020-08-18 LAB — PHOSPHORUS: Phosphorus: 3 mg/dL (ref 2.5–4.6)

## 2020-08-18 LAB — LACTIC ACID, PLASMA: Lactic Acid, Venous: 1 mmol/L (ref 0.5–1.9)

## 2020-08-18 MED ORDER — POTASSIUM CHLORIDE CRYS ER 20 MEQ PO TBCR
40.0000 meq | EXTENDED_RELEASE_TABLET | Freq: Once | ORAL | Status: AC
  Administered 2020-08-18: 40 meq via ORAL
  Filled 2020-08-18: qty 2

## 2020-08-18 MED ORDER — PIPERACILLIN-TAZOBACTAM 3.375 G IVPB
3.3750 g | Freq: Three times a day (TID) | INTRAVENOUS | Status: DC
  Administered 2020-08-18 – 2020-08-21 (×8): 3.375 g via INTRAVENOUS
  Filled 2020-08-18 (×8): qty 50

## 2020-08-18 MED ORDER — DEXTROSE IN LACTATED RINGERS 5 % IV SOLN: INTRAVENOUS | Status: AC

## 2020-08-18 MED ORDER — PIPERACILLIN-TAZOBACTAM 3.375 G IVPB 30 MIN: 3.3750 g | Freq: Four times a day (QID) | INTRAVENOUS | Status: DC

## 2020-08-18 NOTE — Progress Notes (Signed)
Pharmacy Antibiotic Note  Valerie [name] is a 59 y.o. female admitted on 08/09/2020 with persistent colovesicular fistula with abscess . Pharmacy has been consulted for Zosyn dosing.   Plan: Zosyn 3.375g IV q8h (extended infusion, each dose infused over 4 hours) Monitor renal function, cultures, clinical course, ID recommendations    Height: 5\' 2"  (157.5 cm) Weight: 80.7 kg (177 lb 14.4 oz) IBW/kg (Calculated) : 50.1  Temp (24hrs), Avg:98.3 F (36.8 C), Min:97.8 F (36.6 C), Max:99.1 F (37.3 C)  Recent Labs  Lab 08/13/20 0320 08/14/20 0350 08/15/20 0535 08/16/20 0608 08/18/20 0433  WBC  --   --  9.5 6.9  --   CREATININE 1.35* 1.32* 1.32* 1.41* 1.48*  LATICACIDVEN  --   --   --   --  1.0    Estimated Creatinine Clearance: 39.8 mL/min (A) (by C-G formula based on SCr of 1.48 mg/dL (H)).    Allergies  Allergen Reactions  . Carbamazepine Other (See Comments)    Parkinsons like symptoms tremors  . Lyrica [Pregabalin] Other (See Comments)    Sedation Other reaction(s): sedated  . Sertraline Hcl Other (See Comments)    Unknown reaction    Antimicrobials this admission: 12/19 Ceftriaxone >> 12/20, 12/24 >> 12/26 12/20 Cephalexin >> 12/23 12/24 Metronidazole >> 12/26 12/26 Zosyn >>  Dose adjustments this admission: --  Microbiology results: 12/17 UCx: > 100K E.coli  12/17 Respiratory panel: negative  12/22 COVID: negative  12/24 abscess: moderate GNR, abundant Enterococcus avium, cx reincubated for better growth    Thank you for allowing pharmacy to be a part of this patient's care.    Lindell Spar, PharmD, BCPS Clinical Pharmacist  08/18/2020 5:57 PM

## 2020-08-18 NOTE — Progress Notes (Signed)
Patient ID: Valerie [name], female   DOB: [birth year], 60 y.o.   MRN: [id number]   Acute Care Surgery Service Progress Note:    Chief Complaint/Subjective: Some occasional discomfort around perc drain No n/v Eating well Spent time in chair  Objective: Vital signs in last 24 hours: Temp:  [97.8 F (36.6 C)-98.1 F (36.7 C)] 97.8 F (36.6 C) (12/26 0531) Pulse Rate:  [50-68] 55 (12/26 0531) Resp:  [16-20] 16 (12/26 0531) BP: (80-120)/(52-58) 120/57 (12/26 0531) SpO2:  [95 %-98 %] 98 % (12/26 0531) Last BM Date: 08/17/20  Intake/Output from previous day: 12/25 0701 - 12/26 0700 In: 1801.3 [P.O.:720; I.V.:971.3; IV Piggyback:100] Out: 2915 [Urine:2900; Drains:15] Intake/Output this shift: No intake/output data recorded.  Lungs:  nonlabored  Cardiovascular: reg  Abd: soft, min TTP, obese, ND, perc drain - purulent   Extremities: no edema, +SCDs  Neuro: alert, nonfocal  Lab Results: CBC  Recent Labs    08/16/20 0608  WBC 6.9  HGB 8.6*  HCT 27.9*  PLT 194   BMET Recent Labs    08/16/20 0608 08/18/20 0433  NA 142 146*  K 3.6 3.4*  CL 111 113*  CO2 24 24  GLUCOSE 90 84  BUN 13 14  CREATININE 1.41* 1.48*  CALCIUM 8.2* 7.9*   LFT Hepatic Function Latest Ref Rng & Units 08/14/2020 08/13/2020 08/12/2020  Total Protein 6.5 - 8.1 g/dL - - -  Albumin 3.5 - 5.0 g/dL 2.5(L) 2.5(L) 2.5(L)  AST 15 - 41 U/L - - -  ALT 0 - 44 U/L - - -  Alk Phosphatase 38 - 126 U/L - - -  Total Bilirubin 0.3 - 1.2 mg/dL - - -   PT/INR Recent Labs    08/16/20 0608  LABPROT 13.8  INR 1.1   ABG No results for input(s): PHART, HCO3 in the last 72 hours.  Invalid input(s): PCO2, PO2  Studies/Results:  Anti-infectives: Anti-infectives (From admission, onward)   Start     Dose/Rate Route Frequency Ordered Stop   08/16/20 1400  metroNIDAZOLE (FLAGYL) tablet 500 mg        500 mg Oral Every 8 hours 08/16/20 1132     08/16/20 1200  Ampicillin-Sulbactam (UNASYN) 3 g in sodium  chloride 0.9 % 100 mL IVPB  Status:  Discontinued        3 g 200 mL/hr over 30 Minutes Intravenous Every 6 hours 08/16/20 1123 08/16/20 1131   08/16/20 1145  cefTRIAXone (ROCEPHIN) 2 g in sodium chloride 0.9 % 100 mL IVPB        2 g 200 mL/hr over 30 Minutes Intravenous Daily 08/16/20 1131     08/15/20 2200  cephALEXin (KEFLEX) capsule 500 mg        500 mg Oral Every 12 hours 08/15/20 1512 08/15/20 2011   08/12/20 1800  cephALEXin (KEFLEX) capsule 500 mg  Status:  Discontinued        500 mg Oral Every 12 hours 08/12/20 1327 08/15/20 1512   08/11/20 1730  cefTRIAXone (ROCEPHIN) 1 g in sodium chloride 0.9 % 100 mL IVPB  Status:  Discontinued        1 g 200 mL/hr over 30 Minutes Intravenous Every 24 hours 08/11/20 1631 08/12/20 1327      Medications: Scheduled Meds: . ALPRAZolam  1 mg Oral BID  . vitamin C  1,000 mg Oral QHS  . aspirin EC  81 mg Oral Daily  . Chlorhexidine Gluconate Cloth  6 each Topical Daily  . donepezil  10 mg Oral Daily  . enoxaparin (LOVENOX) injection  40 mg Subcutaneous Q24H  . escitalopram  30 mg Oral Daily  . ferrous sulfate  325 mg Oral Q breakfast  . lamoTRIgine  150 mg Oral BID  . metroNIDAZOLE  500 mg Oral Q8H  . nadolol  10 mg Oral Daily  . pantoprazole  40 mg Oral Daily  . phosphorus  250 mg Oral BID  . polyethylene glycol  17 g Oral Daily  . predniSONE  10 mg Oral Q breakfast  . rosuvastatin  5 mg Oral q1800  . sodium chloride flush  10-40 mL Intracatheter Q12H  . sodium chloride flush  3 mL Intravenous Q12H  . sodium chloride flush  5 mL Intracatheter Q8H   Continuous Infusions: . cefTRIAXone (ROCEPHIN)  IV 2 g (08/17/20 1226)   PRN Meds:.acetaminophen **OR** acetaminophen, HYDROcodone-acetaminophen, ondansetron **OR** ondansetron (ZOFRAN) IV, phenazopyridine, sodium chloride flush  Assessment/Plan: Patient Active Problem List   Diagnosis Date Noted  . Hypercalcemia 08/09/2020  . AKI (acute kidney injury) ([place])   . Hypoxemia 07/20/2020   . Altered mental status 07/19/2020  . Acute respiratory failure with hypoxemia ([place]) 07/19/2020  . Anemia, chronic disease 07/18/2020  . 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 Protein-calorie malnutrition, moderate ([place]) 06/05/2020  . Generalized weakness 06/04/2020  . CAD (coronary artery disease) 06/04/2020  . Macrocytic anemia 06/04/2020  . 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[place]) 05/20/2020  . Intra-abdominal infection   . NSTEMI (non-ST elevated myocardial infarction) ([place]) 11/14/2019  . DM type 2 (diabetes mellitus, type 2) ([place]) 11/14/2019  . Depression   . Anxiety   . Chest pain   . Obesity, Class III, BMI 40-49.9 (morbid obesity) ([place])   . CKD (chronic kidney disease), stage III ([place])   . Psoriatic arthritis ([place])   . Tobacco abuse   . OSA (obstructive sleep apnea)   . Neuropathy   . Bipolar disorder ([place])   . Osteoarthritis of right hip 11/05/2014  . Essential hypertension 10/29/2014   Valerie [name] is an 59 y.o. female with  Recurrent colovesicular fistula with abscess Status post robotic low anterior resection with primary colovesicular repair with omental flap and omentopexy 07/17/2020 by Dr. Johney Maine Psoriatic arthritis on immunosuppression -prednisone; methotrexate, Plaquenil, Enbrel held Obesity Chronic anemia E. coli UTI Acute kidney injury on chronic kidney disease-resolved Acute metabolic encephalopathy-resolved  S/p IR drain into abscess Cont diet  Cont vte prophylaxis ID - rocephin/flagyl, follow up drain cultures - so far GNR, ID service rec long term IV abx - not candidate for picc bc of CKD, triad talking with IR about central line/port Cont PT/ot   Disposition: I think pt is nearing point for dc from hospital once the IV situation and abx course is determined. Dr Johney Maine is here tomorrow and can also weigh in   LOS: 8 days    Leighton Ruff. Redmond Pulling,  MD, FACS General, Bariatric, & Minimally Invasive Surgery [phone] [facility]

## 2020-08-18 NOTE — Progress Notes (Signed)
PROGRESS NOTE    Valerie [name]  XVQ:[id number] DOB: December 04, 1960 DOA: 08/09/2020 PCP: Ophelia Shoulder, MD    Chief Complaint  Patient presents with  . Weakness    Brief Narrative:  Valerie [name] is a 59 y.o. 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  Patient was accompanied by her caregiver.  According to caregiver she is having multiple falls and unable to get up from floor, they have to call fire department twice in the last 2 days to help.  Patient is also very somnolent, sleeping randomly while talking.  Subjective:  Had abdominal  drain placement by IR on 12/24, denies pain, no nausea no vomiting ,no fever Daughter at the bedside   Assessment & Plan:   Active Problems:   Generalized weakness   Hypercalcemia  Acute metabolic encephalopathy, present on admission -Acute metabolic encephalopathy likely due to hypercalcemia and UTI -per caregiver "Patient is also very somnolent, sleeping randomly while talking." at home --Daughter report she was confused and very somnolent initially,  confusion appear has resolved from 12/22, she is fully alert   Recent History of diverticulitis, perforation and colovesical fistula s/p repair CT cystogram on 12/23 showed persistent colovesicular fistula with abscess and  free air in abdomen S/p IR drain placement on 12/24, drain culture in process Urine culture on presentation positive for E. coli , currently on IV Rocephin and oral Flagyl she has poor IV access, not able to place picc line due to CKD,  IR consulted to evaluate central line/port placement, plan for line placement  tomorrow  urology / general surgery /IR/ID following   AKI on CKDIII b/anemia of chronic disease -Creatinine 1.78 on presentation , creatinine improved, back to baseline 1.3-1.4 ,hemoglobin stable -Monitor  Hypercalcemia, likely primary hyper parathyroidism ,presents on admission -Presented with generalized weakness, acute encephalopathy, constipation -TSH with normal limits, cortisol 7.8.  Vitamin D level normal at 68.  Calcitriol level unremarkable, PTH is mildly elevated at 73, PTH like protein in process, 24hr urine calcium unremarkabble -Calcium on presentation was 13.4, she does appear dehydrated, she received hydration ,  Zometa and calcitonin nasal spray -calcium is 8.3 today, constipation resolved,  -Case discussed with endocrinology Dr. Benjiman Core who recommend outpatient follow-up with her in 2 to 3 weeks, recommend repeat calcium  in 1 week by PCP  hypophosphatemia -phos nadir at 1 -Replaced and normalized   Hypokalemia, replace K,  mag 2.3  Hyponatremia -New on December 26 -Start D5 LR for 24hrs, encourage oral intake, repeat BMP in the morning   Psoriatic arthritis On prednisone 10 mg daily Was on methotrexate, Plaquenil, Enbrel prior to surgery, follow-up with PCP  Per chart review there is a history of diabetes, her A1c is 5.2 Fasting blood glucose range from 67 to 116 She is not on any medication for this at home Will monitor   History of CAD status post non-STEMI March 2021, history of CVA ( per chart review, though ct head and mri in the system did not show h/o CVA) Stable,  Was on Plavix, currently on asa Continue crestor  Continue nadolol Case discussed with cardiology who state initial plan was to take aspirin and Plavix a year until March  2020 hold plavix due to new CT ab findings above   HTN; continue nadalol  History bipolar disorder-continue Lamictal, Lexapro  H/o mild dementia, on aricept , aaox3, with mild memory deficit, mental  status returned to baseline per daugher   OSA on nightly cpap Body mass index is 32.54 kg/m.   FTT/frequent falls Physical therapy, rehab placement   DVT prophylaxis: enoxaparin (LOVENOX) injection 40 mg Start: 08/17/20 1000   Code Status:full Family Communication: daughter at bedside Disposition:   Status is: Inpatient  Dispo: The patient is from: home              Anticipated d/c is to: SNF              Anticipated d/c date is: 24 to 48 hours, needs general surgery  And ID clearance                 Consultants:   Phone conversation with endocrinology Dr. Cruzita Lederer  Urology Dr. Abner Greenspan  General surgery  Infectious disease  IR  Procedures:   IR drain placement on December 24  Antimicrobials:   As above     Objective: Vitals:   08/17/20 1442 08/17/20 1938 08/17/20 2133 08/18/20 0531  BP: (!) 97/55  (!) 117/58 (!) 120/57  Pulse: (!) 55 68 (!) 50 (!) 55  Resp:  17 16 16   Temp:   98 F (36.7 C) 97.8 F (36.6 C)  TempSrc:   Oral   SpO2:  97% 98% 98%  Weight:      Height:        Intake/Output Summary (Last 24 hours) at 08/18/2020 0804 Last data filed at 08/18/2020 0600 Gross per 24 hour  Intake 1801.27 ml  Output 2915 ml  Net -1113.73 ml   Filed Weights   08/09/20 2135  Weight: 80.7 kg    Examination:  General exam: Chronically ill appearing ,calm, NAD, AAOx3, baseline memory deficit, immediate recall 2 out of 3 Respiratory system: Clear to auscultation. Respiratory effort normal. Cardiovascular system: S1 & S2 heard, RRR. No JVD, no murmur, No pedal edema. Gastrointestinal system: Abdomen is nondistended, soft and nontender.  Normal bowel sounds heard.  Drain to left side abdomen Central nervous system: Alert and orientedx3. No focal neurological deficits. Extremities: equal , generalized weakness, chronic venous stasis changes, no edema Skin: chronic venous stasis changes lower legs Psychiatry: Judgement and insight appear normal. Mood & affect  appropriate.     Data Reviewed: I have personally reviewed following labs and imaging studies  CBC: Recent Labs  Lab 08/15/20 0535 08/16/20 0608  WBC 9.5 6.9  NEUTROABS 6.9  --   HGB 8.3* 8.6*  HCT 26.7* 27.9*  MCV 100.8* 100.7*  PLT 183 [id number]    Basic Metabolic Panel: Recent Labs  Lab 08/13/20 0320 08/14/20 0350 08/15/20 0535 08/16/20 0608 08/18/20 0433  NA 141 141 140 142 146*  K 4.0 3.6 3.3* 3.6 3.4*  CL 110 108 108 111 113*  CO2 23 24 21* 24 24  GLUCOSE 67* 76 93 90 84  BUN 11 13 11 13 14   CREATININE 1.35* 1.32* 1.32* 1.41* 1.48*  CALCIUM 9.5 8.9 8.3* 8.2* 7.9*  MG  --   --   --  2.3  --   PHOS 1.0* 1.8* 1.9* 2.2* 3.0    GFR: Estimated Creatinine Clearance: 39.8 mL/min (A) (by C-G formula based on SCr of 1.48 mg/dL (H)).  Liver Function Tests: Recent Labs  Lab 08/12/20 0428 08/13/20 0320 08/14/20 0350  ALBUMIN 2.5* 2.5* 2.5*    CBG: No results for input(s): GLUCAP in the last 168 hours.   Recent Results (from the past 240 hour(s))  Culture, Urine     Status: Abnormal   Collection Time: 08/09/20 12:37 PM   Specimen: Urine, Clean Catch  Result Value Ref Range Status   Specimen Description   Final    URINE, CLEAN CATCH Performed at [facility], [place] [address], [place], [place] [address]    Special Requests   Final    NONE Performed at [facility], [place] [address], [place], [place] [address]    Culture >=100,000 COLONIES/mL ESCHERICHIA COLI (A)  Final   Report Status 08/12/2020 FINAL  Final   Organism ID, Bacteria ESCHERICHIA COLI (A)  Final      Susceptibility   Escherichia coli - MIC*    AMPICILLIN >=32 RESISTANT Resistant     CEFAZOLIN <=4 SENSITIVE Sensitive     CEFEPIME <=0.12 SENSITIVE Sensitive     CEFTRIAXONE <=0.25 SENSITIVE Sensitive     CIPROFLOXACIN 0.5 SENSITIVE Sensitive     GENTAMICIN <=1 SENSITIVE Sensitive     IMIPENEM <=0.25 SENSITIVE Sensitive     NITROFURANTOIN <=16 SENSITIVE  Sensitive     TRIMETH/SULFA <=20 SENSITIVE Sensitive     AMPICILLIN/SULBACTAM >=32 RESISTANT Resistant     PIP/TAZO <=4 SENSITIVE Sensitive     * >=100,000 COLONIES/mL ESCHERICHIA COLI  Resp Panel by RT-PCR (Flu A&B, Covid) Nasopharyngeal Swab     Status: None   Collection Time: 08/09/20  2:13 PM   Specimen: Nasopharyngeal Swab; Nasopharyngeal(NP) swabs in vial transport medium  Result Value Ref Range Status   SARS Coronavirus 2 by RT PCR NEGATIVE NEGATIVE Final    Comment: (NOTE) SARS-CoV-2 target nucleic acids are NOT DETECTED.  The SARS-CoV-2 RNA is generally detectable in upper respiratory specimens during the acute phase of infection. The lowest concentration of SARS-CoV-2 viral copies this assay can detect is 138 copies/mL. A negative result does not preclude SARS-Cov-2 infection and should not be used as the sole basis for treatment or other patient management decisions. A negative result may occur with  improper specimen collection/handling, submission of specimen other than nasopharyngeal swab, presence of viral mutation(s) within the areas targeted by this assay, and inadequate number of viral copies(<138 copies/mL). A negative result must be combined with clinical observations, patient history, and epidemiological information. The expected result is Negative.  Fact Sheet for Patients:  EntrepreneurPulse.com.au  Fact Sheet for Healthcare Providers:  IncredibleEmployment.be  This test is no t yet approved or cleared by the [place] FDA and  has been authorized for detection and/or diagnosis of SARS-CoV-2 by FDA under an Emergency Use Authorization (EUA). This EUA will remain  in effect (meaning this test can be used) for the duration of the COVID-19 declaration under Section 564(b)(1) of the Act, 21 U.S.C.section 360bbb-3(b)(1), unless the authorization is terminated  or revoked sooner.       Influenza A by PCR NEGATIVE  NEGATIVE Final   Influenza B by PCR NEGATIVE NEGATIVE Final    Comment: (NOTE) The Xpert Xpress SARS-CoV-2/FLU/RSV plus assay is intended as an aid in the diagnosis of influenza from Nasopharyngeal swab specimens and should not be used as a sole basis for treatment. Nasal washings and aspirates are unacceptable for Xpert Xpress SARS-CoV-2/FLU/RSV testing.  Fact Sheet for Patients: EntrepreneurPulse.com.au  Fact Sheet for Healthcare Providers: IncredibleEmployment.be  This test is not yet approved or cleared by the [place]  FDA and has been authorized for detection and/or diagnosis of SARS-CoV-2 by FDA under an Emergency Use Authorization (EUA). This EUA will remain in effect (meaning this test can be used) for the duration of the COVID-19 declaration under Section 564(b)(1) of the Act, 21 U.S.C. section 360bbb-3(b)(1), unless the authorization is terminated or revoked.  Performed at [facility], [place] [address] [place], [place] [address]   SARS Coronavirus 2 by RT PCR (hospital order, performed in [facility] hospital lab) Nasopharyngeal Nasopharyngeal Swab     Status: None   Collection Time: 08/14/20 11:59 PM   Specimen: Nasopharyngeal Swab  Result Value Ref Range Status   SARS Coronavirus 2 NEGATIVE NEGATIVE Final    Comment: (NOTE) SARS-CoV-2 target nucleic acids are NOT DETECTED.  The SARS-CoV-2 RNA is generally detectable in upper and lower respiratory specimens during the acute phase of infection. The lowest concentration of SARS-CoV-2 viral copies this assay can detect is 250 copies / mL. A negative result does not preclude SARS-CoV-2 infection and should not be used as the sole basis for treatment or other patient management decisions.  A negative result may occur with improper specimen collection / handling, submission of specimen other than nasopharyngeal swab, presence of viral mutation(s) within  the areas targeted by this assay, and inadequate number of viral copies (<250 copies / mL). A negative result must be combined with clinical observations, patient history, and epidemiological information.  Fact Sheet for Patients:   StrictlyIdeas.no  Fact Sheet for Healthcare Providers: BankingDealers.co.za  This test is not yet approved or  cleared by the [place] FDA and has been authorized for detection and/or diagnosis of SARS-CoV-2 by FDA under an Emergency Use Authorization (EUA).  This EUA will remain in effect (meaning this test can be used) for the duration of the COVID-19 declaration under Section 564(b)(1) of the Act, 21 U.S.C. section 360bbb-3(b)(1), unless the authorization is terminated or revoked sooner.  Performed at [facility], [place] [address] [place], [place] [address]   Aerobic/Anaerobic Culture (surgical/deep wound)     Status: None (Preliminary result)   Collection Time: 08/16/20  8:42 PM   Specimen: Abscess  Result Value Ref Range Status   Specimen Description   Final    ABSCESS Performed at [facility] [address] [place], [place] [address]    Special Requests   Final    Normal Performed at [facility], [place] [address] [place], [place] [address]    Gram Stain   Final    ABUNDANT WBC PRESENT, PREDOMINANTLY PMN ABUNDANT GRAM POSITIVE COCCI MODERATE GRAM NEGATIVE RODS MODERATE GRAM POSITIVE RODS Performed at [facility], [place] [address] [place],  [address]    Culture PENDING  Incomplete   Report Status PENDING  Incomplete         Radiology Studies: CT IMAGE GUIDED DRAINAGE BY PERCUTANEOUS CATHETER  Result Date: 08/17/2020 CLINICAL DATA:  Colovesical fistula with abscess, drainage requested EXAM: CT GUIDED DRAINAGE OF PELVIC ABSCESS ANESTHESIA/SEDATION: Intravenous Fentanyl 133mcg and Versed 2mg  were administered as  conscious sedation during continuous monitoring of the patient's level of consciousness and physiological / cardiorespiratory status by the radiology RN, with a total moderate sedation time of 15 minutes. PROCEDURE: The procedure, risks, benefits, and alternatives were explained to the patient. Questions regarding the procedure were encouraged and answered. The patient understands and consents to the procedure. Select axial scans through the pelvis was obtained. The collection was localized and  an appropriate skin entry site was determined and marked. The site was prepped with chlorhexidinein a sterile fashion, and a sterile drape was applied covering the operative field. A sterile gown and sterile gloves were used for the procedure. Local anesthesia was provided with 1% Lidocaine. Under CT fluoroscopic guidance, 18 gauge trocar needle advanced into the collection. An Amplatz wire advanced easily. Tract dilated to facilitate placement 12 French pigtail drain catheter. 5 mL of thick feculent material were aspirated, sent for Gram stain and culture. The catheter was secured externally with 0 Prolene suture and StatLock and placed to gravity drain bag. The patient tolerated the procedure well. COMPLICATIONS: None immediate FINDINGS: Gas and fluid collection interposed between the sigmoid colon and urinary bladder was again localized. 12 French pigtail drain catheter placed as above. IMPRESSION: Technically successful CT-guided pelvic abscess drain catheter placement. Electronically Signed   By: Lucrezia Europe M.D.   On: 08/17/2020 08:19   [place] EKG SITE RITE  Result Date: 08/16/2020 If Site Rite image not attached, placement could not be confirmed due to current cardiac rhythm.       Scheduled Meds: . ALPRAZolam  1 mg Oral BID  . vitamin C  1,000 mg Oral QHS  . aspirin EC  81 mg Oral Daily  . Chlorhexidine Gluconate Cloth  6 each Topical Daily  . donepezil  10 mg Oral Daily  . enoxaparin (LOVENOX) injection   40 mg Subcutaneous Q24H  . escitalopram  30 mg Oral Daily  . ferrous sulfate  325 mg Oral Q breakfast  . lamoTRIgine  150 mg Oral BID  . metroNIDAZOLE  500 mg Oral Q8H  . nadolol  10 mg Oral Daily  . pantoprazole  40 mg Oral Daily  . phosphorus  250 mg Oral BID  . polyethylene glycol  17 g Oral Daily  . predniSONE  10 mg Oral Q breakfast  . rosuvastatin  5 mg Oral q1800  . sodium chloride flush  10-40 mL Intracatheter Q12H  . sodium chloride flush  3 mL Intravenous Q12H  . sodium chloride flush  5 mL Intracatheter Q8H   Continuous Infusions: . cefTRIAXone (ROCEPHIN)  IV 2 g (08/17/20 1226)     LOS: 8 days   Time spent: 35 mins Greater than 50% of this time was spent in counseling, explanation of diagnosis, planning of further management, and coordination of care.  I have personally reviewed and interpreted on  08/18/2020 daily labs, I reviewed all nursing notes, pharmacy notes,  vitals, pertinent old records  I have discussed plan of care as described above with RN , patient and family on 08/18/2020  Voice Recognition /Dragon dictation system was used to create this note, attempts have been made to correct errors. Please contact the author with questions and/or clarifications.   Florencia Reasons, MD PhD FACP [facility]  Available via Epic secure chat 7am-7pm for nonurgent issues Please page for urgent issues To page the attending provider between 7A-7P or the covering provider during after hours 7P-7A, please log into the web site [email or web address] and access using universal Guy password for that web site. If you do not have the password, please call the hospital operator.    08/18/2020, 8:04 AM

## 2020-08-18 NOTE — Progress Notes (Signed)
[facility]  Date of Admission:  08/09/2020        Abx: 12/19, 12/24-c ceftriaxone 12/24-c metronidazole  12/20-23 cephalexin   Assessment/PLAN: 59 yo female hx ckd3, diverticulitis, complicated by colovessicular fistula and intraabdominal abscess. Admitted 12/17 for sepsis  S/p ir placed perc drain 12/24. cx in process. E avium noted; should be ok with ampicillin equivalent  Draining well. Clinically well appearing. No evidence sepsis at this time  1. swtich ceftriaxone/flagyl to piptazo 2. F/u final cx 3. Anticipate prolonged iv abx  Active Problems:   Generalized weakness   Hypercalcemia   Scheduled Meds: . ALPRAZolam  1 mg Oral BID  . vitamin C  1,000 mg Oral QHS  . aspirin EC  81 mg Oral Daily  . Chlorhexidine Gluconate Cloth  6 each Topical Daily  . donepezil  10 mg Oral Daily  . enoxaparin (LOVENOX) injection  40 mg Subcutaneous Q24H  . escitalopram  30 mg Oral Daily  . ferrous sulfate  325 mg Oral Q breakfast  . lamoTRIgine  150 mg Oral BID  . nadolol  10 mg Oral Daily  . pantoprazole  40 mg Oral Daily  . polyethylene glycol  17 g Oral Daily  . predniSONE  10 mg Oral Q breakfast  . rosuvastatin  5 mg Oral q1800  . sodium chloride flush  10-40 mL Intracatheter Q12H  . sodium chloride flush  3 mL Intravenous Q12H  . sodium chloride flush  5 mL Intracatheter Q8H   Continuous Infusions: . dextrose 5% lactated ringers 75 mL/hr at 08/18/20 1141  . piperacillin-tazobactam     PRN Meds:.acetaminophen **OR** acetaminophen, HYDROcodone-acetaminophen, ondansetron **OR** ondansetron (ZOFRAN) IV, phenazopyridine, sodium chloride flush   SUBJECTIVE: No abd pain, eating well No n/v/diarrhea/rash cx pending but growing enterococcus avium  Review of Systems: ROS Other ros negative  Allergies  Allergen Reactions  . Carbamazepine Other (See Comments)    Parkinsons like symptoms tremors  . Lyrica [Pregabalin] Other  (See Comments)    Sedation Other reaction(s): sedated  . Sertraline Hcl Other (See Comments)    Unknown reaction    OBJECTIVE: Vitals:   08/17/20 2133 08/18/20 0531 08/18/20 0950 08/18/20 1334  BP: (!) 117/58 (!) 120/57 (!) 134/54 108/63  Pulse: (!) 50 (!) 55 61 62  Resp: 16 16 18 18   Temp: 98 F (36.7 C) 97.8 F (36.6 C)  99.1 F (37.3 C)  TempSrc: Oral   Oral  SpO2: 98% 98% 95% 96%  Weight:      Height:       Body mass index is 32.54 kg/m.  Physical Exam Constitutional:      Appearance: Normal appearance. She is obese.  HENT:     Head: Normocephalic.     Mouth/Throat:     Pharynx: Oropharynx is clear.  Eyes:     Pupils: Pupils are equal, round, and reactive to light.  Cardiovascular:     Rate and Rhythm: Normal rate and regular rhythm.     Heart sounds: Normal heart sounds.  Pulmonary:     Effort: No respiratory distress.     Breath sounds: Normal breath sounds.  Abdominal:     Palpations: Abdomen is soft.     Comments: LLQ perc drain intact; purulent liquid in catheter bag  Musculoskeletal:     Cervical back: Normal range of motion.  Skin:    General: Skin is warm.     Findings: No rash.  Neurological:     General: No focal deficit present.     Lab Results Lab Results  Component Value Date   WBC 6.9 08/16/2020   HGB 8.6 (L) 08/16/2020   HCT 27.9 (L) 08/16/2020   MCV 100.7 (H) 08/16/2020   PLT 194 08/16/2020    Lab Results  Component Value Date   CREATININE 1.48 (H) 08/18/2020   BUN 14 08/18/2020   NA 146 (H) 08/18/2020   K 3.4 (L) 08/18/2020   CL 113 (H) 08/18/2020   CO2 24 08/18/2020    Lab Results  Component Value Date   ALT 16 08/10/2020   AST 15 08/10/2020   ALKPHOS 69 08/10/2020   BILITOT 0.5 08/10/2020     Microbiology: Recent Results (from the past 240 hour(s))  Culture, Urine     Status: Abnormal   Collection Time: 08/09/20 12:37 PM   Specimen: Urine, Clean Catch  Result Value Ref Range Status   Specimen Description    Final    URINE, CLEAN CATCH Performed at [facility], [place] [address], [place], [place] [address]    Special Requests   Final    NONE Performed at [facility], [place] [address] [place], [place] [address]    Culture >=100,000 COLONIES/mL ESCHERICHIA COLI (A)  Final   Report Status 08/12/2020 FINAL  Final   Organism ID, Bacteria ESCHERICHIA COLI (A)  Final      Susceptibility   Escherichia coli - MIC*    AMPICILLIN >=32 RESISTANT Resistant     CEFAZOLIN <=4 SENSITIVE Sensitive     CEFEPIME <=0.12 SENSITIVE Sensitive     CEFTRIAXONE <=0.25 SENSITIVE Sensitive     CIPROFLOXACIN 0.5 SENSITIVE Sensitive     GENTAMICIN <=1 SENSITIVE Sensitive     IMIPENEM <=0.25 SENSITIVE Sensitive     NITROFURANTOIN <=16 SENSITIVE Sensitive     TRIMETH/SULFA <=20 SENSITIVE Sensitive     AMPICILLIN/SULBACTAM >=32 RESISTANT Resistant     PIP/TAZO <=4 SENSITIVE Sensitive     * >=100,000 COLONIES/mL ESCHERICHIA COLI  Resp Panel by RT-PCR (Flu A&B, Covid) Nasopharyngeal Swab     Status: None   Collection Time: 08/09/20  2:13 PM   Specimen: Nasopharyngeal Swab; Nasopharyngeal(NP) swabs in vial transport medium  Result Value Ref Range Status   SARS Coronavirus 2 by RT PCR NEGATIVE NEGATIVE Final    Comment: (NOTE) SARS-CoV-2 target nucleic acids are NOT DETECTED.  The SARS-CoV-2 RNA is generally detectable in upper respiratory specimens during the acute phase of infection. The lowest concentration of SARS-CoV-2 viral copies this assay can detect is 138 copies/mL. A negative result does not preclude SARS-Cov-2 infection and should not be used as the sole basis for treatment or other patient management decisions. A negative result may occur with  improper specimen collection/handling, submission of specimen other than nasopharyngeal swab, presence of viral mutation(s) within the areas targeted by this assay, and inadequate number of viral copies(<138 copies/mL). A  negative result must be combined with clinical observations, patient history, and epidemiological information. The expected result is Negative.  Fact Sheet for Patients:  EntrepreneurPulse.com.au  Fact Sheet for Healthcare Providers:  IncredibleEmployment.be  This test is no t yet approved or cleared by the [place] FDA and  has been authorized for detection and/or diagnosis of SARS-CoV-2 by FDA under an Emergency Use Authorization (EUA). This EUA will remain  in effect (meaning this test can be used) for the duration of the COVID-19 declaration under Section 564(b)(1) of the Act,  21 U.S.C.section 360bbb-3(b)(1), unless the authorization is terminated  or revoked sooner.       Influenza A by PCR NEGATIVE NEGATIVE Final   Influenza B by PCR NEGATIVE NEGATIVE Final    Comment: (NOTE) The Xpert Xpress SARS-CoV-2/FLU/RSV plus assay is intended as an aid in the diagnosis of influenza from Nasopharyngeal swab specimens and should not be used as a sole basis for treatment. Nasal washings and aspirates are unacceptable for Xpert Xpress SARS-CoV-2/FLU/RSV testing.  Fact Sheet for Patients: EntrepreneurPulse.com.au  Fact Sheet for Healthcare Providers: IncredibleEmployment.be  This test is not yet approved or cleared by the [place] FDA and has been authorized for detection and/or diagnosis of SARS-CoV-2 by FDA under an Emergency Use Authorization (EUA). This EUA will remain in effect (meaning this test can be used) for the duration of the COVID-19 declaration under Section 564(b)(1) of the Act, 21 U.S.C. section 360bbb-3(b)(1), unless the authorization is terminated or revoked.  Performed at [facility], [place] [address], [place], [place] [address]   SARS Coronavirus 2 by RT PCR (hospital order, performed in [facility] hospital lab) Nasopharyngeal Nasopharyngeal Swab     Status:  None   Collection Time: 08/14/20 11:59 PM   Specimen: Nasopharyngeal Swab  Result Value Ref Range Status   SARS Coronavirus 2 NEGATIVE NEGATIVE Final    Comment: (NOTE) SARS-CoV-2 target nucleic acids are NOT DETECTED.  The SARS-CoV-2 RNA is generally detectable in upper and lower respiratory specimens during the acute phase of infection. The lowest concentration of SARS-CoV-2 viral copies this assay can detect is 250 copies / mL. A negative result does not preclude SARS-CoV-2 infection and should not be used as the sole basis for treatment or other patient management decisions.  A negative result may occur with improper specimen collection / handling, submission of specimen other than nasopharyngeal swab, presence of viral mutation(s) within the areas targeted by this assay, and inadequate number of viral copies (<250 copies / mL). A negative result must be combined with clinical observations, patient history, and epidemiological information.  Fact Sheet for Patients:   StrictlyIdeas.no  Fact Sheet for Healthcare Providers: BankingDealers.co.za  This test is not yet approved or  cleared by the [place] FDA and has been authorized for detection and/or diagnosis of SARS-CoV-2 by FDA under an Emergency Use Authorization (EUA).  This EUA will remain in effect (meaning this test can be used) for the duration of the COVID-19 declaration under Section 564(b)(1) of the Act, 21 U.S.C. section 360bbb-3(b)(1), unless the authorization is terminated or revoked sooner.  Performed at [facility], [place] [address] [place], [place] [address]   Aerobic/Anaerobic Culture (surgical/deep wound)     Status: None (Preliminary result)   Collection Time: 08/16/20  8:42 PM   Specimen: Abscess  Result Value Ref Range Status   Specimen Description   Final    ABSCESS Performed at [place] [address] [place], [place] [address]    Special Requests   Final    Normal Performed at [facility], [place] [address], [place], [place] [address]    Gram Stain   Final    ABUNDANT WBC PRESENT, PREDOMINANTLY PMN ABUNDANT GRAM POSITIVE COCCI MODERATE GRAM NEGATIVE RODS MODERATE GRAM POSITIVE RODS    Culture   Final    MODERATE GRAM NEGATIVE RODS ABUNDANT ENTEROCOCCUS AVIUM CULTURE REINCUBATED FOR BETTER GROWTH Performed at [facility], [place] [address] [place], [place] [address]  Report Status PENDING  Incomplete    Imaging: 12/23 abd pelv ct I personally reviewed -- abscess next to bladder; fisula colonic-bladder noted  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  8.0 x 4.0 x 4.4 cm diameter abscess collection adjacent to the bladder and sigmoid colon, also opacifying with contrast from bladder fistula.  Free air within the abdomen consistent with perforation.  Sigmoid diverticulosis with wall thickening and adjacent infiltrative changes though these could be related to prior surgery rather than diverticulitis.  Subcutaneous fluid collection and gas collection anterior pelvic wall 8.7 x 2.6 x 5.5 cm, could be sterile or infected.  Aortic Atherosclerosis (ICD10-I70.0).   Raymondo Band, MD [facility] [phone] pager    08/18/2020, 5:33 PM

## 2020-08-19 ENCOUNTER — Telehealth: Payer: Self-pay | Admitting: Internal Medicine

## 2020-08-19 ENCOUNTER — Encounter (HOSPITAL_COMMUNITY): Payer: Self-pay | Admitting: Internal Medicine

## 2020-08-19 ENCOUNTER — Inpatient Hospital Stay (HOSPITAL_COMMUNITY): Payer: Medicare Other

## 2020-08-19 DIAGNOSIS — K651 Peritoneal abscess: Secondary | ICD-10-CM

## 2020-08-19 HISTORY — PX: IR FLUORO GUIDE CV LINE RIGHT: IMG2283

## 2020-08-19 HISTORY — PX: IR US GUIDE VASC ACCESS RIGHT: IMG2390

## 2020-08-19 LAB — CBC WITH DIFFERENTIAL/PLATELET
Abs Immature Granulocytes: 0.07 10*3/uL (ref 0.00–0.07)
Basophils Absolute: 0 10*3/uL (ref 0.0–0.1)
Basophils Relative: 1 %
Eosinophils Absolute: 0.3 10*3/uL (ref 0.0–0.5)
Eosinophils Relative: 3 %
HCT: 27.8 % — ABNORMAL LOW (ref 36.0–46.0)
Hemoglobin: 8.1 g/dL — ABNORMAL LOW (ref 12.0–15.0)
Immature Granulocytes: 1 %
Lymphocytes Relative: 13 %
Lymphs Abs: 1 10*3/uL (ref 0.7–4.0)
MCH: 30.6 pg (ref 26.0–34.0)
MCHC: 29.1 g/dL — ABNORMAL LOW (ref 30.0–36.0)
MCV: 104.9 fL — ABNORMAL HIGH (ref 80.0–100.0)
Monocytes Absolute: 0.5 10*3/uL (ref 0.1–1.0)
Monocytes Relative: 7 %
Neutro Abs: 5.9 10*3/uL (ref 1.7–7.7)
Neutrophils Relative %: 75 %
Platelets: 215 10*3/uL (ref 150–400)
RBC: 2.65 MIL/uL — ABNORMAL LOW (ref 3.87–5.11)
RDW: 15.3 % (ref 11.5–15.5)
WBC: 7.8 10*3/uL (ref 4.0–10.5)
nRBC: 0 % (ref 0.0–0.2)

## 2020-08-19 LAB — COMPREHENSIVE METABOLIC PANEL
ALT: 13 U/L (ref 0–44)
AST: 13 U/L — ABNORMAL LOW (ref 15–41)
Albumin: 2.6 g/dL — ABNORMAL LOW (ref 3.5–5.0)
Alkaline Phosphatase: 49 U/L (ref 38–126)
Anion gap: 6 (ref 5–15)
BUN: 11 mg/dL (ref 6–20)
CO2: 25 mmol/L (ref 22–32)
Calcium: 7.9 mg/dL — ABNORMAL LOW (ref 8.9–10.3)
Chloride: 112 mmol/L — ABNORMAL HIGH (ref 98–111)
Creatinine, Ser: 1.36 mg/dL — ABNORMAL HIGH (ref 0.44–1.00)
GFR, Estimated: 45 mL/min — ABNORMAL LOW (ref >60)
Glucose, Bld: 92 mg/dL (ref 70–99)
Potassium: 4 mmol/L (ref 3.5–5.1)
Sodium: 143 mmol/L (ref 135–145)
Total Bilirubin: 0.4 mg/dL (ref 0.3–1.2)
Total Protein: 4.9 g/dL — ABNORMAL LOW (ref 6.5–8.1)

## 2020-08-19 LAB — PTH-RELATED PEPTIDE: PTH-related peptide: <2 pmol/L

## 2020-08-19 LAB — PREALBUMIN: Prealbumin: 17.5 mg/dL — ABNORMAL LOW (ref 18–38)

## 2020-08-19 MED ORDER — LIDOCAINE HCL 1 % IJ SOLN
INTRAMUSCULAR | Status: AC | PRN
  Administered 2020-08-19 (×2): 5 mL

## 2020-08-19 MED ORDER — SODIUM CHLORIDE 0.9 % IV SOLN
500.0000 mg | Freq: Once | INTRAVENOUS | Status: AC
  Administered 2020-08-19: 500 mg via INTRAVENOUS
  Filled 2020-08-19 (×2): qty 10

## 2020-08-19 MED ORDER — IOHEXOL 300 MG/ML  SOLN
50.0000 mL | Freq: Once | INTRAMUSCULAR | Status: AC | PRN
  Administered 2020-08-19: 4 mL

## 2020-08-19 MED ORDER — LIDOCAINE HCL 1 % IJ SOLN
INTRAMUSCULAR | Status: AC
  Filled 2020-08-19: qty 20

## 2020-08-19 MED ORDER — SODIUM CHLORIDE 0.9 % IV SOLN: 500.0000 mg | Freq: Once | INTRAVENOUS | Status: DC

## 2020-08-19 MED ORDER — SODIUM CHLORIDE 0.9 % IV SOLN: 25.0000 mg | Freq: Once | INTRAVENOUS | Status: DC

## 2020-08-19 NOTE — Progress Notes (Signed)
PHARMACY CONSULT NOTE FOR:  OUTPATIENT  PARENTERAL ANTIBIOTIC THERAPY (OPAT)  Indication: abdominal abscess Regimen: zosyn 12.5g/day as continuous infusion End date: 09/16/20  IV antibiotic discharge orders are pended. To discharging provider:  please sign these orders via discharge navigator,  Select New Orders & click on the button choice - Manage This Unsigned Work.     Thank you for allowing pharmacy to be a part of this patient's care.  Peggyann Juba, PharmD, BCPS Pharmacy: [phone] 08/19/2020, 5:29 PM

## 2020-08-19 NOTE — Progress Notes (Signed)
Valerie [name] [id number] Apr 09, 1961  CARE TEAM:  PCP: Ophelia Shoulder, MD  Outpatient Care Team: Patient Care Team: Ophelia Shoulder, MD as PCP - General (Internal Medicine) [name], MD as PCP - Cardiology (Cardiology) Janith Lima, MD as Consulting Physician (Urology) Michael Boston, MD as Consulting Physician (Colon and Rectal Surgery) Carol Ada, MD as Consulting Physician (Gastroenterology) [name], MD as Consulting Physician (Cardiology) Janith Lima, MD as Consulting Physician (Urology)  Inpatient Treatment Team: Treatment Team: Attending Provider: Shelly Coss, MD; Rounding Team: Fatima Blank, MD; Technician: Ernest Mallick, NT; Consulting Physician: Janith Lima, MD; Consulting Physician: Nolon Nations, MD; Technician: Gardiner Ramus, NT; Physical Therapy Assistant: Diego Cory, PTA; Technician: Karna Christmas, NT; Registered Nurse: Guadlupe Spanish, RN; Utilization Review: Barbara Cower, RN   Problem List:   Active Problems:   Generalized weakness   Hypercalcemia   Intra-abdominal abscess ([place])        07/17/2020  POST-OPERATIVE DIAGNOSIS:  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 Diverticulitis with colovesical fistula  Postoperative diagnosis:  1. Diverticulitis with colovesical fistula  Procedure: 1. Cystoscopy 2. Bilateral ureteral cannulation and injection of indocyanine green dye  Surgeon: Pryor Curia. M.D.  SURGICAL PATHOLOGY   CASE: [id number]  [name]   FINAL MICROSCOPIC DIAGNOSIS:   A. COLON, RECTOSIGMOID, RESECTION:  - Diverticulitis with  perforation and pericolonic abscess, clinically  colovesical fistula.  - Two benign lymph nodes.  - No evidence of malignancy.   B. FINAL DISTAL MARGIN:  - Unremarkable colon.  - No evidence of malignancy.   Assessment  SLOWLY IMPROVING  [facility] Stay = 9 days)  Plan:  Getting central line for prolonged IV ABx per ID.  F/u cultures  I would keep drain in abscess cavity & Foley in bladder for several weeks upto 3 months in the hopes that the delayed bladder leak will close on its own as infection & hopefully nutrition improve.  The source (sigmoid diverticulitis) has been removed with the diverticulitis removed.  There is omentum nearby.  Should gradually improve with time.  Discussed with Dr. Diona Fanti with urology who agrees with the plan.  Pathology consistent with diverticulitis.    Heart healthy solid diet as tolerated  Mental status WNL to me.  Defer to medicine and neurology - CT & MRI underwhelming a hopeful sign.  H/o hypoxia on oxygen. - defer to medicine  Acute on chronic anemia.  Iron deficient.  IV iron given 11/25.  Given again  Keep on low-dose steroids for now to minimize anastomotic healing issues while the same time preventing debilitating psoriatic arthritis.    Severe psoriatic arthritis.  Usually on Enbrel.  Can consider restarting that around 3 weeks out from surgery on side outside window of possible leak or abscess.  Hopefully could wean off steroids as well  Nystatin under panniculus.  Yeast rash resolved now  mobilize as tolerated to help recovery.  PT/OT seeing.  She is already rather deconditioned.  Hopefully can work with therapy when she is more alert.    Patient would benefit from skilled nursing facility for more aggressive care and daily rehab.  We will get transitional care team involved given her  medical complexity and deconditioned state.  Medicine had taken over as lead but now seems to be wishing me to be primary again.  Patient has not  weaned off oxygen = will need oxygen supplementation per TRH.  Otherwise, should be able to discharge later today 11/30, with or without Foley depending on cystogram findings  40 minutes spent in review, evaluation, examination, counseling, and coordination of care.  More than 50% of that time was spent in counseling.  08/19/2020    Subjective: (Chief complaint)  Down in Int Rad - questions about line Pt denies pain - feels well    Objective:  Vital signs:  Vitals:   08/18/20 0950 08/18/20 1334 08/18/20 2046 08/19/20 0530  BP: (!) 134/54 108/63 112/66 (!) 104/58  Pulse: 61 62 (!) 50 (!) 49  Resp: 18 18 17 20   Temp:  99.1 F (37.3 C) 98.9 F (37.2 C) 98.1 F (36.7 C)  TempSrc:  Oral Oral Oral  SpO2: 95% 96% 99% 100%  Weight:      Height:        Last BM Date: 08/18/20  Intake/Output   Yesterday:  12/26 0701 - 12/27 0700 In: 650.8 [I.V.:544.9; IV Piggyback:105.9] Out: 1850 [Urine:1850] This shift:  No intake/output data recorded.  Bowel function:  Flatus: YES  BM:  YES  Drain: grey/tan perc drain   Physical Exam:  General: Pt awake/alert in no acute distress.  Alert Eyes: PERRL, normal EOM.  Sclera clear.  No icterus Neuro: CN II-XII intact w/o focal sensory/motor deficits. Lymph: No head/neck/groin lymphadenopathy Psych:  No delerium/psychosis/paranoia.  Oriented x 4 HENT: Normocephalic, Mucus membranes moist.  No thrush Neck: Supple, No tracheal deviation.  No obvious thyromegaly Chest: No pain to chest wall compression.  Good respiratory excursion.  No audible wheezing CV:  Pulses intact.  Regular rhythm.  No major extremity edema MS: Normal AROM mjr joints.  No obvious deformity  Abdomen: Soft.  Nondistended.  Nontender.  Incision closed - clean dry and intact no evidence of peritonitis.  No incarcerated hernias.  GU: Pfannenstiel incision lulitis/seroma/ecchymosis.  Foley in place with clear    yellow urine.  Ext:  No deformity.  No mjr edema.   No cyanosis Skin: No petechiae / purpurea.  No major sores.  Warm and dry    Results:   SURGICAL PATHOLOGY   CASE: [id number]  [name]  Surgical Pathology Report   Clinical History: Colo vesical fistula ([facility])   FINAL MICROSCOPIC DIAGNOSIS:   A. COLON, RECTOSIGMOID, RESECTION:  - Diverticulitis with perforation and pericolonic abscess, clinically  colovesical fistula.  - Two benign lymph nodes.  - No evidence of malignancy.   B. FINAL DISTAL MARGIN:  - Unremarkable colon.  - No evidence of malignancy.   Lash Matulich DESCRIPTION:  Specimen A: Rectosigmoid colon, open end is proximal, received fresh.  Length: 29 cm, tortuous.  Serosa: Pink-red to dark red, diffusely roughened. Within the mid  segment there is a 0.4 cm transmural defect.  Contents: Small amount of light green-brown soft material.  Mucosa/Wall: The mucosa is tan-pink to hyperemic, smooth, soft, with  normal intestinal folds. There are no mass lesions. The wall is up to  2.3 cm thick, and has multiple diverticula throughout the segment, one  of which is involved with the transmural defect.  Lymph nodes: 5 possible lymph nodes are sampled from the fat.  Block Summary:  Block 1 = proximal margin  Block 2 = distal margin  Blocks 3, 4 = transmural  defect associated with diverticulum  Block 5 = nonperforated diverticulum  Block 6 = 5 possible nodes   Specimen B: Received fresh. Is a 1.6 cm in length and 2.1 cm in  diameter ring of tissue with overlying tan-pink smooth mucosa, and  underlying muscularis with embedded staples. Representative sections  are submitted in 1 block.  SW 07/19/2020    Final Diagnosis performed by Claudette Laws, MD.  Electronically signed  07/22/2020  Technical and / or Professional components performed at [facility], [place] [address] [place], [place] [address].  Immunohistochemistry Technical component (if applicable) was performed  at  [facility]. [address], [place],  [place], [place] [address].  IMMUNOHISTOCHEMISTRY DISCLAIMER (if applicable):  Some of these immunohistochemical stains Zena Amos been developed and the  performance characteristics determine by [facility]. Some  may not have been cleared or approved by the U.S. Food and Drug  Administration. The FDA has determined that such clearance or approval  is not necessary. This test is used for clinical purposes. It should not  be regarded as investigational or for research. This laboratory is  certified under the [employer]  (CLIA-88) as qualified to perform high complexity clinical laboratory  testing. The controls stained appropriately.   Cultures: Recent Results (from the past 720 hour(s))  Resp Panel by RT-PCR (Flu A&B, Covid) Nasopharyngeal Swab     Status: None   Collection Time: 07/23/20  9:53 AM   Specimen: Nasopharyngeal Swab; Nasopharyngeal(NP) swabs in vial transport medium  Result Value Ref Range Status   SARS Coronavirus 2 by RT PCR NEGATIVE NEGATIVE Final    Comment: (NOTE) SARS-CoV-2 target nucleic acids are NOT DETECTED.  The SARS-CoV-2 RNA is generally detectable in upper respiratory specimens during the acute phase of infection. The lowest concentration of SARS-CoV-2 viral copies this assay can detect is 138 copies/mL. A negative result does not preclude SARS-Cov-2 infection and should not be used as the sole basis for treatment or other patient management decisions. A negative result may occur with  improper specimen collection/handling, submission of specimen other than nasopharyngeal swab, presence of viral mutation(s) within the areas targeted by this assay, and inadequate number of viral copies(<138 copies/mL). A negative result must be combined with clinical observations, patient history, and epidemiological information. The expected result is  Negative.  Fact Sheet for Patients:  EntrepreneurPulse.com.au  Fact Sheet for Healthcare Providers:  IncredibleEmployment.be  This test is no t yet approved or cleared by the [place] FDA and  has been authorized for detection and/or diagnosis of SARS-CoV-2 by FDA under an Emergency Use Authorization (EUA). This EUA will remain  in effect (meaning this test can be used) for the duration of the COVID-19 declaration under Section 564(b)(1) of the Act, 21 U.S.C.section 360bbb-3(b)(1), unless the authorization is terminated  or revoked sooner.       Influenza A by PCR NEGATIVE NEGATIVE Final   Influenza B by PCR NEGATIVE NEGATIVE Final    Comment: (NOTE) The Xpert Xpress SARS-CoV-2/FLU/RSV plus assay is intended as an aid in the diagnosis of influenza from Nasopharyngeal swab specimens and should not be used as a sole basis for treatment. Nasal washings and aspirates are unacceptable for Xpert Xpress SARS-CoV-2/FLU/RSV testing.  Fact Sheet for Patients: EntrepreneurPulse.com.au  Fact Sheet for Healthcare Providers: IncredibleEmployment.be  This test is not yet approved or cleared by the [place] FDA and has been authorized for detection and/or diagnosis of SARS-CoV-2 by  FDA under an Emergency Use Authorization (EUA). This EUA will remain in effect (meaning this test can be used) for the duration of the COVID-19 declaration under Section 564(b)(1) of the Act, 21 U.S.C. section 360bbb-3(b)(1), unless the authorization is terminated or revoked.  Performed at [facility], [place] [address], [place], [place] [address]   Culture, Urine     Status: Abnormal   Collection Time: 08/09/20 12:37 PM   Specimen: Urine, Clean Catch  Result Value Ref Range Status   Specimen Description   Final    URINE, CLEAN CATCH Performed at [facility], [place] [address],  [place], [place] [address]    Special Requests   Final    NONE Performed at [facility], [place] [address] [place], [place] [address]    Culture >=100,000 COLONIES/mL ESCHERICHIA COLI (A)  Final   Report Status 08/12/2020 FINAL  Final   Organism ID, Bacteria ESCHERICHIA COLI (A)  Final      Susceptibility   Escherichia coli - MIC*    AMPICILLIN >=32 RESISTANT Resistant     CEFAZOLIN <=4 SENSITIVE Sensitive     CEFEPIME <=0.12 SENSITIVE Sensitive     CEFTRIAXONE <=0.25 SENSITIVE Sensitive     CIPROFLOXACIN 0.5 SENSITIVE Sensitive     GENTAMICIN <=1 SENSITIVE Sensitive     IMIPENEM <=0.25 SENSITIVE Sensitive     NITROFURANTOIN <=16 SENSITIVE Sensitive     TRIMETH/SULFA <=20 SENSITIVE Sensitive     AMPICILLIN/SULBACTAM >=32 RESISTANT Resistant     PIP/TAZO <=4 SENSITIVE Sensitive     * >=100,000 COLONIES/mL ESCHERICHIA COLI  Resp Panel by RT-PCR (Flu A&B, Covid) Nasopharyngeal Swab     Status: None   Collection Time: 08/09/20  2:13 PM   Specimen: Nasopharyngeal Swab; Nasopharyngeal(NP) swabs in vial transport medium  Result Value Ref Range Status   SARS Coronavirus 2 by RT PCR NEGATIVE NEGATIVE Final    Comment: (NOTE) SARS-CoV-2 target nucleic acids are NOT DETECTED.  The SARS-CoV-2 RNA is generally detectable in upper respiratory specimens during the acute phase of infection. The lowest concentration of SARS-CoV-2 viral copies this assay can detect is 138 copies/mL. A negative result does not preclude SARS-Cov-2 infection and should not be used as the sole basis for treatment or other patient management decisions. A negative result may occur with  improper specimen collection/handling, submission of specimen other than nasopharyngeal swab, presence of viral mutation(s) within the areas targeted by this assay, and inadequate number of viral copies(<138 copies/mL). A negative result must be combined with clinical observations, patient history, and  epidemiological information. The expected result is Negative.  Fact Sheet for Patients:  EntrepreneurPulse.com.au  Fact Sheet for Healthcare Providers:  IncredibleEmployment.be  This test is no t yet approved or cleared by the [place] FDA and  has been authorized for detection and/or diagnosis of SARS-CoV-2 by FDA under an Emergency Use Authorization (EUA). This EUA will remain  in effect (meaning this test can be used) for the duration of the COVID-19 declaration under Section 564(b)(1) of the Act, 21 U.S.C.section 360bbb-3(b)(1), unless the authorization is terminated  or revoked sooner.       Influenza A by PCR NEGATIVE NEGATIVE Final   Influenza B by PCR NEGATIVE NEGATIVE Final    Comment: (NOTE) The Xpert Xpress SARS-CoV-2/FLU/RSV plus assay is intended as an aid in the diagnosis of influenza from Nasopharyngeal swab specimens and should not be used as a sole basis for treatment. Nasal washings and aspirates are unacceptable  for Xpert Xpress SARS-CoV-2/FLU/RSV testing.  Fact Sheet for Patients: EntrepreneurPulse.com.au  Fact Sheet for Healthcare Providers: IncredibleEmployment.be  This test is not yet approved or cleared by the [place] FDA and has been authorized for detection and/or diagnosis of SARS-CoV-2 by FDA under an Emergency Use Authorization (EUA). This EUA will remain in effect (meaning this test can be used) for the duration of the COVID-19 declaration under Section 564(b)(1) of the Act, 21 U.S.C. section 360bbb-3(b)(1), unless the authorization is terminated or revoked.  Performed at [facility], [place] [address], [place], [place] [address]   SARS Coronavirus 2 by RT PCR (hospital order, performed in [facility] hospital lab) Nasopharyngeal Nasopharyngeal Swab     Status: None   Collection Time: 08/14/20 11:59 PM   Specimen: Nasopharyngeal Swab  Result  Value Ref Range Status   SARS Coronavirus 2 NEGATIVE NEGATIVE Final    Comment: (NOTE) SARS-CoV-2 target nucleic acids are NOT DETECTED.  The SARS-CoV-2 RNA is generally detectable in upper and lower respiratory specimens during the acute phase of infection. The lowest concentration of SARS-CoV-2 viral copies this assay can detect is 250 copies / mL. A negative result does not preclude SARS-CoV-2 infection and should not be used as the sole basis for treatment or other patient management decisions.  A negative result may occur with improper specimen collection / handling, submission of specimen other than nasopharyngeal swab, presence of viral mutation(s) within the areas targeted by this assay, and inadequate number of viral copies (<250 copies / mL). A negative result must be combined with clinical observations, patient history, and epidemiological information.  Fact Sheet for Patients:   StrictlyIdeas.no  Fact Sheet for Healthcare Providers: BankingDealers.co.za  This test is not yet approved or  cleared by the [place] FDA and has been authorized for detection and/or diagnosis of SARS-CoV-2 by FDA under an Emergency Use Authorization (EUA).  This EUA will remain in effect (meaning this test can be used) for the duration of the COVID-19 declaration under Section 564(b)(1) of the Act, 21 U.S.C. section 360bbb-3(b)(1), unless the authorization is terminated or revoked sooner.  Performed at [facility], [place] [address] [place], [place] [address]   Aerobic/Anaerobic Culture (surgical/deep wound)     Status: None (Preliminary result)   Collection Time: 08/16/20  8:42 PM   Specimen: Abscess  Result Value Ref Range Status   Specimen Description   Final    ABSCESS Performed at [place] [address], [place], [place] [address]    Special Requests   Final    Normal Performed at [facility], [place] [address] [place], [place] [address]    Gram Stain   Final    ABUNDANT WBC PRESENT, PREDOMINANTLY PMN ABUNDANT GRAM POSITIVE COCCI MODERATE GRAM NEGATIVE RODS MODERATE GRAM POSITIVE RODS    Culture   Final    MODERATE GRAM NEGATIVE RODS ABUNDANT ENTEROCOCCUS AVIUM CULTURE REINCUBATED FOR BETTER GROWTH Performed at [facility], [place] [address] [place], [place] [address]    Report Status PENDING  Incomplete    Labs: Results for orders placed or performed during the hospital encounter of 08/09/20 (from the past 48 hour(s))  Basic metabolic panel     Status: Abnormal   Collection Time: 08/18/20  4:33 AM  Result Value Ref Range   Sodium 146 (H) 135 - 145 mmol/L   Potassium 3.4 (L) 3.5 - 5.1 mmol/L   Chloride 113 (H) 98 - 111  mmol/L   CO2 24 22 - 32 mmol/L   Glucose, Bld 84 70 - 99 mg/dL    Comment: Glucose reference range applies only to samples taken after fasting for at least 8 hours.   BUN 14 6 - 20 mg/dL   Creatinine, Ser 1.48 (H) 0.44 - 1.00 mg/dL   Calcium 7.9 (L) 8.9 - 10.3 mg/dL   GFR, Estimated 40 (L) >60 mL/min    Comment: (NOTE) Calculated using the CKD-EPI Creatinine Equation (2021)    Anion gap 9 5 - 15    Comment: Performed at [facility], [place] [address], [place], [place] [address]  Phosphorus     Status: None   Collection Time: 08/18/20  4:33 AM  Result Value Ref Range   Phosphorus 3.0 2.5 - 4.6 mg/dL    Comment: Performed at [facility], [place] [address] [place], [place] [address]  Lactic acid, plasma     Status: None   Collection Time: 08/18/20  4:33 AM  Result Value Ref Range   Lactic Acid, Venous 1.0 0.5 - 1.9 mmol/L    Comment: Performed at [facility], [place] [address] [place], [place] [address]  CBC with Differential/Platelet     Status: Abnormal   Collection Time: 08/19/20  4:08 AM  Result Value Ref Range   WBC 7.8 4.0 - 10.5 K/uL   RBC 2.65 (L) 3.87 -  5.11 MIL/uL   Hemoglobin 8.1 (L) 12.0 - 15.0 g/dL   HCT 27.8 (L) 36.0 - 46.0 %   MCV 104.9 (H) 80.0 - 100.0 fL   MCH 30.6 26.0 - 34.0 pg   MCHC 29.1 (L) 30.0 - 36.0 g/dL   RDW 15.3 11.5 - 15.5 %   Platelets 215 150 - 400 K/uL   nRBC 0.0 0.0 - 0.2 %   Neutrophils Relative % 75 %   Neutro Abs 5.9 1.7 - 7.7 K/uL   Lymphocytes Relative 13 %   Lymphs Abs 1.0 0.7 - 4.0 K/uL   Monocytes Relative 7 %   Monocytes Absolute 0.5 0.1 - 1.0 K/uL   Eosinophils Relative 3 %   Eosinophils Absolute 0.3 0.0 - 0.5 K/uL   Basophils Relative 1 %   Basophils Absolute 0.0 0.0 - 0.1 K/uL   Immature Granulocytes 1 %   Abs Immature Granulocytes 0.07 0.00 - 0.07 K/uL    Comment: Performed at [facility], [place] [address] [place], [place] [address]  Comprehensive metabolic panel     Status: Abnormal   Collection Time: 08/19/20  4:08 AM  Result Value Ref Range   Sodium 143 135 - 145 mmol/L   Potassium 4.0 3.5 - 5.1 mmol/L   Chloride 112 (H) 98 - 111 mmol/L   CO2 25 22 - 32 mmol/L   Glucose, Bld 92 70 - 99 mg/dL    Comment: Glucose reference range applies only to samples taken after fasting for at least 8 hours.   BUN 11 6 - 20 mg/dL   Creatinine, Ser 1.36 (H) 0.44 - 1.00 mg/dL   Calcium 7.9 (L) 8.9 - 10.3 mg/dL   Total Protein 4.9 (L) 6.5 - 8.1 g/dL   Albumin 2.6 (L) 3.5 - 5.0 g/dL   AST 13 (L) 15 - 41 U/L   ALT 13 0 - 44 U/L   Alkaline Phosphatase 49 38 - 126 U/L   Total Bilirubin 0.4 0.3 - 1.2 mg/dL   GFR, Estimated 45 (L) >60 mL/min  Comment: (NOTE) Calculated using the CKD-EPI Creatinine Equation (2021)    Anion gap 6 5 - 15    Comment: Performed at [facility], [place] [address], [place], [place] [address]    Imaging / Studies: No results found.  Medications / Allergies: per chart  Antibiotics: Anti-infectives (From admission, onward)   Start     Dose/Rate Route Frequency Ordered Stop   08/18/20 1830  piperacillin-tazobactam (ZOSYN) IVPB 3.375 g   Status:  Discontinued       Note to Pharmacy: If extended infusion is available, 3.375 q8hrs dosing is ok, if not, then regular dosing q6hours   3.375 g 100 mL/hr over 30 Minutes Intravenous Every 6 hours 08/18/20 1732 08/18/20 1756   08/18/20 1800  piperacillin-tazobactam (ZOSYN) IVPB 3.375 g        3.375 g 12.5 mL/hr over 240 Minutes Intravenous Every 8 hours 08/18/20 1756     08/16/20 1400  metroNIDAZOLE (FLAGYL) tablet 500 mg  Status:  Discontinued        500 mg Oral Every 8 hours 08/16/20 1132 08/18/20 1732   08/16/20 1200  Ampicillin-Sulbactam (UNASYN) 3 g in sodium chloride 0.9 % 100 mL IVPB  Status:  Discontinued        3 g 200 mL/hr over 30 Minutes Intravenous Every 6 hours 08/16/20 1123 08/16/20 1131   08/16/20 1145  cefTRIAXone (ROCEPHIN) 2 g in sodium chloride 0.9 % 100 mL IVPB  Status:  Discontinued        2 g 200 mL/hr over 30 Minutes Intravenous Daily 08/16/20 1131 08/18/20 1732   08/15/20 2200  cephALEXin (KEFLEX) capsule 500 mg        500 mg Oral Every 12 hours 08/15/20 1512 08/15/20 2011   08/12/20 1800  cephALEXin (KEFLEX) capsule 500 mg  Status:  Discontinued        500 mg Oral Every 12 hours 08/12/20 1327 08/15/20 1512   08/11/20 1730  cefTRIAXone (ROCEPHIN) 1 g in sodium chloride 0.9 % 100 mL IVPB  Status:  Discontinued        1 g 200 mL/hr over 30 Minutes Intravenous Every 24 hours 08/11/20 1631 08/12/20 1327        Note: Portions of this report may have been transcribed using voice recognition software. Every effort was made to ensure accuracy; however, inadvertent computerized transcription errors may be present.   Any transcriptional errors that result from this process are unintentional.    [name], MD, FACS, MASCRS Gastrointestinal and Minimally Invasive Surgery  [facility] [address] [place], [place] [address] [phone] Fax [phone] Main/Paging  CONTACT INFORMATION: Weekday (9AM-5PM) concerns:  Call [facility] main office at [phone] Weeknight (5PM-9AM) or Weekend/Holiday concerns: Check [email or web address] for General Surgery [facility] coverage (Please, do not use SecureChat as it is not reliable communication to operating surgeons for immediate patient care)      08/19/2020  8:37 AM

## 2020-08-19 NOTE — Progress Notes (Signed)
PROGRESS NOTE    Valerie [name]  OBS:[id number] DOB: September 22, 1960 DOA: 08/09/2020 PCP: Otelia Santee I, MD   Chief Complain: Weakness    Brief Narrative: Patient is a 59 year old female with history of severe psoriatic arthritis, OSA, CKD, bipolar disorder, diabetes type 2, anemia, diverticulitis, colovesical fistula status post repair in November 2021 who presents to the emergency department with complaints of generalized weakness, recurrent falls,, confusion.  She was found to have hypercalcelcemia, UTI.  CT cystogram done on 12/23 showed persistent colovesical fistula with abscess and free air in the abdomen.  She underwent IR guided drain placement on 12/24.  Culture showed multiple organisms.  ID, general surgery, IR were following.  She underwent tunneled cathter  placement today.  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  Currently she alert and oriented.  Colovesical fistula with abscess: She was recently admitted here for diverticulitis, perforation/colovesical fistula status post repair.  CT cystogram on 12/23 showed persistent colovesical fistula with abscess and free air in the abdomen.  IR placed a LLQ drain on 12/24.  Wound Culture showing  multiple organisms,but mostly Ecoli.  Currently on Zosyn.  ID following.  We will  follow-up with ID about antibiotics recommendation.  Underwent tunneled right IJ central line placed today.She will be discharged on foley  AKI on CKD stage IIIb: Currently kidney function at baseline.  Anemia of chronic disease: Currently hemoglobin is stable.  Hypercalcemia: Likely primary hyperparathyroidism.  Presented  with generalized weakness, encephalopathy, constipation.  TSH normal.  Vitamin D level normal. Calcitriol level unremarkable,PTH is mildly elevated at 73, PTH like protein in process, 24hr urine calcium unremarkabble Calcium on presentation was 13.4, she appeared  dehydrated, she received hydration ,  Zometa and calcitonin nasal spray -calcium is normal now, constipation resolved,  -Case discussed with endocrinology Dr. Benjiman Core who recommend outpatient follow-up with her in 2 to 3 weeks, recommend repeat calcium  in 1 week by PCP  Electrolyte abnormalities: Being supplemented and monitored  Psoriatic arthritis On prednisone 10 mg daily Was on methotrexate, Plaquenil, Enbrel prior to surgery, follow-up with PCP  History of CAD :status post non-STEMI March 2021, history of CVA  Was on Plavix, currently on asa Continue crestor  Continue nadolol  HTN; continue nadalol  History bipolar disorder-continue Lamictal, Lexapro  H/o mild dementia On aricept , aaox3, with mild memory deficit, mental status returned to baseline per daugher  OSA on nightly cpap Body mass index is 32.54 kg/m.   debility/frequent falls Physical therapy, rehab placement plan        DVT prophylaxis:Lovenox Code Status: Full Family Communication:  Status is: Inpatient  Remains inpatient appropriate because:Inpatient level of care appropriate due to severity of illness   Dispo: The patient is from: Home              Anticipated d/c is to:  SNF              Anticipated d/c date is: 1 day              Patient currently is not medically stable to d/c.   Consultants:  ID,IR,surgery  Procedures:IR drain   Antimicrobials:  Anti-infectives (From admission, onward)   Start     Dose/Rate Route Frequency Ordered Stop   08/18/20 1830  piperacillin-tazobactam (ZOSYN) IVPB 3.375 g  Status:  Discontinued       Note to Pharmacy: If extended infusion is available, 3.375 q8hrs dosing is ok, if not, then regular dosing q6hours   3.375 g 100 mL/hr over 30 Minutes Intravenous Every 6 hours 08/18/20 1732 08/18/20 1756   08/18/20 1800  piperacillin-tazobactam (ZOSYN) IVPB 3.375 g        3.375 g 12.5 mL/hr over 240 Minutes Intravenous Every 8 hours 08/18/20 1756     08/16/20 1400  metroNIDAZOLE (FLAGYL) tablet 500 mg  Status:  Discontinued        500 mg Oral Every 8 hours 08/16/20 1132 08/18/20 1732   08/16/20 1200  Ampicillin-Sulbactam (UNASYN) 3 g in sodium chloride 0.9 % 100 mL IVPB  Status:  Discontinued        3 g 200 mL/hr over 30 Minutes Intravenous Every 6 hours 08/16/20 1123 08/16/20 1131   08/16/20 1145  cefTRIAXone (ROCEPHIN) 2 g in sodium chloride 0.9 % 100 mL IVPB  Status:  Discontinued        2 g 200 mL/hr over 30 Minutes Intravenous Daily 08/16/20 1131 08/18/20 1732   08/15/20 2200  cephALEXin (KEFLEX) capsule 500 mg        500 mg Oral Every 12 hours 08/15/20 1512 08/15/20 2011   08/12/20 1800  cephALEXin (KEFLEX) capsule 500 mg  Status:  Discontinued        500 mg Oral Every 12 hours 08/12/20 1327 08/15/20 1512   08/11/20 1730  cefTRIAXone (ROCEPHIN) 1 g in sodium chloride 0.9 % 100 mL IVPB  Status:  Discontinued        1 g 200 mL/hr over 30 Minutes Intravenous Every 24 hours 08/11/20 1631 08/12/20 1327      Subjective: Patient seen and examined at bedside this afternoon.  Sitting on a chair.  Hemodynamic stable.  Pain well controlled.  No new complaints  Objective: Vitals:   08/18/20 2046 08/19/20 0530 08/19/20 1013 08/19/20 1457  BP: 112/66 (!) 104/58 126/61 140/64  Pulse: (!) 50 (!) 49 (!) 48 (!) 48  Resp: '17 20 18 14  ' Temp: 98.9 F (37.2  C) 98.1 F (36.7 C) 98.2 F (36.8 C) 98.2 F (36.8 C)  TempSrc: Oral Oral Oral Oral  SpO2: 99% 100% 98% 97%  Weight:      Height:        Intake/Output Summary (Last 24 hours) at 08/19/2020 1459 Last data filed at 08/19/2020 1026 Gross per 24 hour  Intake 1065.79 ml  Output 1900 ml  Net -834.21 ml   Filed Weights   08/09/20 2135  Weight: 80.7 kg    Examination:  General exam: Comfortable, obese, pleasant female HEENT:PERRL,Oral mucosa moist, Ear/Nose normal on gross exam Respiratory system: Bilateral equal air entry, normal vesicular breath sounds, no wheezes or crackles  Cardiovascular system: S1 & S2 heard, RRR. No JVD, murmurs, rubs, gallops or clicks. No pedal edema.  Tunneled catheter on the right chest. Gastrointestinal system: Abdomen is nondistended, soft and nontender. No organomegaly or masses felt. Normal bowel  sounds heard.  Left lower quadrant drain.foley Central nervous system: Alert and oriented. No focal neurological deficits. Extremities: No edema, no clubbing ,no cyanosis, midline catheter on the right arm Skin: No rashes, lesions or ulcers,no icterus ,no pallor MSK: Normal muscle bulk,tone ,power     Data Reviewed: I have personally reviewed following labs and imaging studies  CBC: Recent Labs  Lab 08/15/20 0535 08/16/20 0608 08/19/20 0408  WBC 9.5 6.9 7.8  NEUTROABS 6.9  --  5.9  HGB 8.3* 8.6* 8.1*  HCT 26.7* 27.9* 27.8*  MCV 100.8* 100.7* 104.9*  PLT 183 194 257   Basic Metabolic Panel: Recent Labs  Lab 08/13/20 0320 08/14/20 0350 08/15/20 0535 08/16/20 0608 08/18/20 0433 08/19/20 0408  NA 141 141 140 142 146* 143  K 4.0 3.6 3.3* 3.6 3.4* 4.0  CL 110 108 108 111 113* 112*  CO2 23 24 21* '24 24 25  ' GLUCOSE 67* 76 93 90 84 92  BUN '11 13 11 13 14 11  ' CREATININE 1.35* 1.32* 1.32* 1.41* 1.48* 1.36*  CALCIUM 9.5 8.9 8.3* 8.2* 7.9* 7.9*  MG  --   --   --  2.3  --   --   PHOS 1.0* 1.8* 1.9* 2.2* 3.0  --    GFR: Estimated Creatinine  Clearance: 43.3 mL/min (A) (by C-G formula based on SCr of 1.36 mg/dL (H)). Liver Function Tests: Recent Labs  Lab 08/13/20 0320 08/14/20 0350 08/19/20 0408  AST  --   --  13*  ALT  --   --  13  ALKPHOS  --   --  49  BILITOT  --   --  0.4  PROT  --   --  4.9*  ALBUMIN 2.5* 2.5* 2.6*   No results for input(s): LIPASE, AMYLASE in the last 168 hours. No results for input(s): AMMONIA in the last 168 hours. Coagulation Profile: Recent Labs  Lab 08/16/20 0608  INR 1.1   Cardiac Enzymes: No results for input(s): CKTOTAL, CKMB, CKMBINDEX, TROPONINI in the last 168 hours. BNP (last 3 results) No results for input(s): PROBNP in the last 8760 hours. HbA1C: No results for input(s): HGBA1C in the last 72 hours. CBG: No results for input(s): GLUCAP in the last 168 hours. Lipid Profile: No results for input(s): CHOL, HDL, LDLCALC, TRIG, CHOLHDL, LDLDIRECT in the last 72 hours. Thyroid Function Tests: No results for input(s): TSH, T4TOTAL, FREET4, T3FREE, THYROIDAB in the last 72 hours. Anemia Panel: No results for input(s): VITAMINB12, FOLATE, FERRITIN, TIBC, IRON, RETICCTPCT in the last 72 hours. Sepsis Labs: Recent Labs  Lab 08/18/20 0433  LATICACIDVEN 1.0    Recent Results (from the past 240 hour(s))  SARS Coronavirus 2 by RT PCR (hospital order, performed in [facility] hospital lab) Nasopharyngeal Nasopharyngeal Swab     Status: None   Collection Time: 08/14/20 11:59 PM   Specimen: Nasopharyngeal Swab  Result Value Ref Range Status   SARS Coronavirus 2 NEGATIVE NEGATIVE Final    Comment: (NOTE) SARS-CoV-2 target nucleic acids are NOT DETECTED.  The SARS-CoV-2 RNA is generally detectable in upper and lower respiratory specimens during the acute phase of infection. The lowest concentration of SARS-CoV-2 viral copies this assay can detect is 250 copies / mL. A negative result does not preclude SARS-CoV-2 infection and should not be used as the sole basis for treatment or  other patient management decisions.  A negative result may occur with improper specimen collection / handling, submission of specimen other than nasopharyngeal swab, presence  of viral mutation(s) within the areas targeted by this assay, and inadequate number of viral copies (<250 copies / mL). A negative result must be combined with clinical observations, patient history, and epidemiological information.  Fact Sheet for Patients:   StrictlyIdeas.no  Fact Sheet for Healthcare Providers: BankingDealers.co.za  This test is not yet approved or  cleared by the [place] FDA and has been authorized for detection and/or diagnosis of SARS-CoV-2 by FDA under an Emergency Use Authorization (EUA).  This EUA will remain in effect (meaning this test can be used) for the duration of the COVID-19 declaration under Section 564(b)(1) of the Act, 21 U.S.C. section 360bbb-3(b)(1), unless the authorization is terminated or revoked sooner.  Performed at [facility], [place] [address], [place], [place] [address]   Aerobic/Anaerobic Culture (surgical/deep wound)     Status: None (Preliminary result)   Collection Time: 08/16/20  8:42 PM   Specimen: Abscess  Result Value Ref Range Status   Specimen Description   Final    ABSCESS Performed at [facility] [address] [place], [place] [address]    Special Requests   Final    Normal Performed at [facility], [place] [address] [place], [place] [address]    Gram Stain   Final    ABUNDANT WBC PRESENT, PREDOMINANTLY PMN ABUNDANT GRAM POSITIVE COCCI MODERATE GRAM NEGATIVE RODS MODERATE GRAM POSITIVE RODS    Culture   Final    MODERATE ESCHERICHIA COLI ABUNDANT ENTEROCOCCUS AVIUM SUSCEPTIBILITIES TO FOLLOW HOLDING FOR POSSIBLE ANAEROBE Performed at [facility], [place] [address], [place], [place] [address]    Report Status PENDING   Incomplete   Organism ID, Bacteria ESCHERICHIA COLI  Final      Susceptibility   Escherichia coli - MIC*    AMPICILLIN >=32 RESISTANT Resistant     CEFAZOLIN <=4 SENSITIVE Sensitive     CEFEPIME <=0.12 SENSITIVE Sensitive     CEFTAZIDIME <=1 SENSITIVE Sensitive     CEFTRIAXONE <=0.25 SENSITIVE Sensitive     CIPROFLOXACIN 0.5 SENSITIVE Sensitive     GENTAMICIN <=1 SENSITIVE Sensitive     IMIPENEM <=0.25 SENSITIVE Sensitive     TRIMETH/SULFA <=20 SENSITIVE Sensitive     AMPICILLIN/SULBACTAM >=32 RESISTANT Resistant     PIP/TAZO <=4 SENSITIVE Sensitive     * MODERATE ESCHERICHIA COLI         Radiology Studies: IR Fluoro Guide CV Line Right  Result Date: 08/19/2020 INDICATION: Poor venous access. In need of durable intravenous access for medication administration and blood draws. Patient with history of previous failed attempted bedside PICC line placement. EXAM: 1. ULTRASOUND AND FLUOROSCOPIC GUIDED PICC LINE INSERTION 2. ULTRASOUND AND FLUOROSCOPIC GUIDED PLACEMENT OF A TUNNELED CENTRAL VENOUS CATHETER MEDICATIONS: None. CONTRAST:  10 cc Omnipaque 300 FLUOROSCOPY TIME:  1 minute, 24 seconds (277 mGy) COMPLICATIONS: None immediate. TECHNIQUE: The procedure, risks, benefits, and alternatives were explained to the patient and informed written consent was obtained. A timeout was performed prior to the initiation of the procedure. The right upper extremity was prepped with chlorhexidine in a sterile fashion, and a sterile drape was applied covering the operative field. Maximum barrier sterile technique with sterile gowns and gloves were used for the procedure. A timeout was performed prior to the initiation of the procedure. Local anesthesia was provided with 1% lidocaine. Under direct ultrasound guidance, the brachial vein was accessed with a micropuncture kit after the overlying soft tissues were anesthetized with 1% lidocaine. After the  overlying soft tissues were anesthetized, a small venotomy  incision was created and a micropuncture kit was utilized to access the right basilic vein. Real-time ultrasound guidance was utilized for vascular access including the acquisition of a permanent ultrasound image documenting patency of the accessed vessel. As was difficulty advancing guidewire to the level of the superior cavoatrial junction. Limited contrast injection was performed via the peel-away sheath demonstrating occlusion of the right subclavian vein with development of hypertrophied supraclavicular venous collaterals. As such, decision was made to place a midline catheter. A guidewire was advanced to the level of the right axilla for measurement purposes and the PICC line was cut to length. A peel-away sheath was placed and a 21 cm, 5 [place], dual lumen was inserted to level of the right axillary vein. A post procedure spot fluoroscopic was obtained. The catheter easily aspirated and flushed and was secured in place with stat lock device. A dressing was applied. _________________________________________________________ As patient is in need of durable intravenous access and has difficulty maintaining patency of previous midline catheters, the decision was made to proceed with placement of a definitive tunneled central venous catheter, The skin overlying the base of the right neck and right upper chest were prepped and draped in usual sterile fashion. Local anesthesia was provided with 1% lidocaine. Under direct ultrasound guidance, the right internal jugular vein was accessed with a micropuncture kit after the overlying soft tissues were anesthetized with 1% lidocaine. After the overlying soft tissues were anesthetized, a small venotomy incision was created and a micropuncture kit was utilized to access the right internal jugular vein. Real-time ultrasound guidance was utilized for vascular access including the acquisition of a permanent ultrasound image documenting patency of the accessed vessel. The  tunneled single-lumen central venous catheter was then tunneled from the right upper chest to the venotomy site. The guidewire was utilized for measurement purposes and the catheter was inserted via the peel-away sheath with tip ultimately terminating within the superior cavoatrial junction. A tunneled central venous catheter was noted to easily aspirate and flush was secured in place within interrupted suture. Dressings were applied. The patient tolerated the above procedures well without immediate postprocedural complication FINDINGS: After catheter placement, the right upper extremity approach midline dual lumen PICC line tip lies within the right axillary vein. After catheter placement, the right internal jugular approach tunneled single-lumen central venous catheter tip lies within the superior cavoatrial junction. Both catheters easily aspirate and flush and both are ready for immediate use. IMPRESSION: 1. Successful ultrasound and fluoroscopic guided placement of a tunneled right internal jugular approach single-lumen central venous catheter with tip terminating within the superior cavoatrial junction. 2. Successful ultrasound and fluoroscopic guided placement of a right upper extremity approach midline PICC with tip terminating within the right axillary vein. 3. 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 Electronically Signed   By: Sandi Mariscal M.D.   On: 08/19/2020 10:55   IR US Guide Vasc Access Right  Result Date: 08/19/2020 INDICATION: Poor venous access. In need of durable intravenous access for medication administration and blood draws. Patient with history of previous failed attempted bedside PICC line placement. EXAM: 1. ULTRASOUND AND FLUOROSCOPIC GUIDED PICC LINE INSERTION 2. ULTRASOUND AND FLUOROSCOPIC GUIDED PLACEMENT OF A TUNNELED CENTRAL VENOUS CATHETER MEDICATIONS: None.  CONTRAST:  10 cc Omnipaque 300 FLUOROSCOPY TIME:  1 minute, 24 seconds (038 mGy) COMPLICATIONS: None immediate. TECHNIQUE: The procedure, risks, benefits, and alternatives  were explained to the patient and informed written consent was obtained. A timeout was performed prior to the initiation of the procedure. The right upper extremity was prepped with chlorhexidine in a sterile fashion, and a sterile drape was applied covering the operative field. Maximum barrier sterile technique with sterile gowns and gloves were used for the procedure. A timeout was performed prior to the initiation of the procedure. Local anesthesia was provided with 1% lidocaine. Under direct ultrasound guidance, the brachial vein was accessed with a micropuncture kit after the overlying soft tissues were anesthetized with 1% lidocaine. After the overlying soft tissues were anesthetized, a small venotomy incision was created and a micropuncture kit was utilized to access the right basilic vein. Real-time ultrasound guidance was utilized for vascular access including the acquisition of a permanent ultrasound image documenting patency of the accessed vessel. As was difficulty advancing guidewire to the level of the superior cavoatrial junction. Limited contrast injection was performed via the peel-away sheath demonstrating occlusion of the right subclavian vein with development of hypertrophied supraclavicular venous collaterals. As such, decision was made to place a midline catheter. A guidewire was advanced to the level of the right axilla for measurement purposes and the PICC line was cut to length. A peel-away sheath was placed and a 21 cm, 5 [place], dual lumen was inserted to level of the right axillary vein. A post procedure spot fluoroscopic was obtained. The catheter easily aspirated and flushed and was secured in place with stat lock device. A dressing was applied. _________________________________________________________ As patient is  in need of durable intravenous access and has difficulty maintaining patency of previous midline catheters, the decision was made to proceed with placement of a definitive tunneled central venous catheter, The skin overlying the base of the right neck and right upper chest were prepped and draped in usual sterile fashion. Local anesthesia was provided with 1% lidocaine. Under direct ultrasound guidance, the right internal jugular vein was accessed with a micropuncture kit after the overlying soft tissues were anesthetized with 1% lidocaine. After the overlying soft tissues were anesthetized, a small venotomy incision was created and a micropuncture kit was utilized to access the right internal jugular vein. Real-time ultrasound guidance was utilized for vascular access including the acquisition of a permanent ultrasound image documenting patency of the accessed vessel. The tunneled single-lumen central venous catheter was then tunneled from the right upper chest to the venotomy site. The guidewire was utilized for measurement purposes and the catheter was inserted via the peel-away sheath with tip ultimately terminating within the superior cavoatrial junction. A tunneled central venous catheter was noted to easily aspirate and flush was secured in place within interrupted suture. Dressings were applied. The patient tolerated the above procedures well without immediate postprocedural complication FINDINGS: After catheter placement, the right upper extremity approach midline dual lumen PICC line tip lies within the right axillary vein. After catheter placement, the right internal jugular approach tunneled single-lumen central venous catheter tip lies within the superior cavoatrial junction. Both catheters easily aspirate and flush and both are ready for immediate use. IMPRESSION: 1. Successful ultrasound and fluoroscopic guided placement of a tunneled right internal jugular approach single-lumen central venous  catheter with tip terminating within the superior cavoatrial junction. 2. Successful ultrasound and fluoroscopic guided placement of a right upper extremity approach midline PICC with tip terminating within the right axillary vein. 3. 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 Electronically  Signed   By: Sandi Mariscal M.D.   On: 08/19/2020 10:55   IR PICC PLACEMENT RIGHT >5 YRS INC IMG GUIDE  Result Date: 08/19/2020 INDICATION: Poor venous access. In need of durable intravenous access for medication administration and blood draws. Patient with history of previous failed attempted bedside PICC line placement. EXAM: 1. ULTRASOUND AND FLUOROSCOPIC GUIDED PICC LINE INSERTION 2. ULTRASOUND AND FLUOROSCOPIC GUIDED PLACEMENT OF A TUNNELED CENTRAL VENOUS CATHETER MEDICATIONS: None. CONTRAST:  10 cc Omnipaque 300 FLUOROSCOPY TIME:  1 minute, 24 seconds (676 mGy) COMPLICATIONS: None immediate. TECHNIQUE: The procedure, risks, benefits, and alternatives were explained to the patient and informed written consent was obtained. A timeout was performed prior to the initiation of the procedure. The right upper extremity was prepped with chlorhexidine in a sterile fashion, and a sterile drape was applied covering the operative field. Maximum barrier sterile technique with sterile gowns and gloves were used for the procedure. A timeout was performed prior to the initiation of the procedure. Local anesthesia was provided with 1% lidocaine. Under direct ultrasound guidance, the brachial vein was accessed with a micropuncture kit after the overlying soft tissues were anesthetized with 1% lidocaine. After the overlying soft tissues were anesthetized, a small venotomy incision was created and a micropuncture kit was utilized to access the right basilic vein. Real-time ultrasound guidance was utilized for vascular access  including the acquisition of a permanent ultrasound image documenting patency of the accessed vessel. As was difficulty advancing guidewire to the level of the superior cavoatrial junction. Limited contrast injection was performed via the peel-away sheath demonstrating occlusion of the right subclavian vein with development of hypertrophied supraclavicular venous collaterals. As such, decision was made to place a midline catheter. A guidewire was advanced to the level of the right axilla for measurement purposes and the PICC line was cut to length. A peel-away sheath was placed and a 21 cm, 5 [place], dual lumen was inserted to level of the right axillary vein. A post procedure spot fluoroscopic was obtained. The catheter easily aspirated and flushed and was secured in place with stat lock device. A dressing was applied. _________________________________________________________ As patient is in need of durable intravenous access and has difficulty maintaining patency of previous midline catheters, the decision was made to proceed with placement of a definitive tunneled central venous catheter, The skin overlying the base of the right neck and right upper chest were prepped and draped in usual sterile fashion. Local anesthesia was provided with 1% lidocaine. Under direct ultrasound guidance, the right internal jugular vein was accessed with a micropuncture kit after the overlying soft tissues were anesthetized with 1% lidocaine. After the overlying soft tissues were anesthetized, a small venotomy incision was created and a micropuncture kit was utilized to access the right internal jugular vein. Real-time ultrasound guidance was utilized for vascular access including the acquisition of a permanent ultrasound image documenting patency of the accessed vessel. The tunneled single-lumen central venous catheter was then tunneled from the right upper chest to the venotomy site. The guidewire was utilized for measurement  purposes and the catheter was inserted via the peel-away sheath with tip ultimately terminating within the superior cavoatrial junction. A tunneled central venous catheter was noted to easily aspirate and flush was secured in place within interrupted suture. Dressings were applied. The patient tolerated the above procedures well without immediate postprocedural complication FINDINGS: After catheter placement, the right upper extremity approach midline dual lumen PICC line tip lies within the right axillary vein. After  catheter placement, the right internal jugular approach tunneled single-lumen central venous catheter tip lies within the superior cavoatrial junction. Both catheters easily aspirate and flush and both are ready for immediate use. IMPRESSION: 1. Successful ultrasound and fluoroscopic guided placement of a tunneled right internal jugular approach single-lumen central venous catheter with tip terminating within the superior cavoatrial junction. 2. Successful ultrasound and fluoroscopic guided placement of a right upper extremity approach midline PICC with tip terminating within the right axillary vein. 3. 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 Electronically Signed   By: Sandi Mariscal M.D.   On: 08/19/2020 10:55        Scheduled Meds: . ALPRAZolam  1 mg Oral BID  . vitamin C  1,000 mg Oral QHS  . aspirin EC  81 mg Oral Daily  . Chlorhexidine Gluconate Cloth  6 each Topical Daily  . donepezil  10 mg Oral Daily  . enoxaparin (LOVENOX) injection  40 mg Subcutaneous Q24H  . escitalopram  30 mg Oral Daily  . ferrous sulfate  325 mg Oral Q breakfast  . lamoTRIgine  150 mg Oral BID  . lidocaine      . nadolol  10 mg Oral Daily  . pantoprazole  40 mg Oral Daily  . polyethylene glycol  17 g Oral Daily  . predniSONE  10 mg Oral Q breakfast  . rosuvastatin  5 mg Oral q1800   . sodium chloride flush  10-40 mL Intracatheter Q12H  . sodium chloride flush  3 mL Intravenous Q12H  . sodium chloride flush  5 mL Intracatheter Q8H   Continuous Infusions: . iron dextran (INFED/DEXFERRUM) infusion 500 mg (08/19/20 1230)  . piperacillin-tazobactam (ZOSYN)  IV 3.375 g (08/19/20 1026)     LOS: 9 days    Time spent: 35 mins,More than 50% of that time was spent in counseling and/or coordination of care.      Shelly Coss, MD [facility] P12/27/2021, 2:59 PM

## 2020-08-19 NOTE — Progress Notes (Signed)
Pts. Home CPAP, only needs humidifier refilled, auto start with nasal pillows on room air, aware to notify if needed.

## 2020-08-19 NOTE — Progress Notes (Signed)
MEDICATION-RELATED CONSULT NOTE   IR Procedure Consult - Anticoagulant/Antiplatelet PTA/Inpatient Med List Review by Pharmacist    Procedure: IJ placement, PICC placement    Completed: 12/27 10:57  Post-Procedural bleeding risk per IR MD assessment:  Low/standard  Antithrombotic medications on inpatient or PTA profile prior to procedure:   Enoxaparin 40mg  SQ q24h - most recent dose on 12/27 at 1033.  Anticoagulant order was NOT discontinued or held prior to procedure.   Recommended restart time per IR Post-Procedure Guidelines:  Day + 1 (Next AM)  Other considerations:   No immediate post procedural complication.  Hgb remains low/stable at 8.1  Plan:     Continue Enoxaparin 40mg  SQ q24h as ordered.  Next dose due on 12/28.    PharmD, BCPS Clinical Pharmacist [facility] main pharmacy [phone] 08/19/2020 11:10 AM

## 2020-08-19 NOTE — Progress Notes (Signed)
A/p Intraabdominal abscess colovessicular fistula  cx growing ecoli, enterococcus species, and pending final result   Appointment date and time: 09/11/2020 @ 1130 with dr Staci Righter  Final Antibiotics recommendation/duration: Piperacillin-tazobactam 12.5 gram continuous daily infusion Duration please plan 4 weeks until 09/16/2020  Labs Monitor (check all applied): _x_ weekly cbc, cmp, crp __ weekly vancomycin __ trough or __ predialysis level __ other TDM    Other: She'll also need a repeat abd/pelv ct with contrast a week prior to that (will arrange)   [facility] address: [facility] Located in: [facility] Address: [address], [place], [place] [address] Phone: [phone]

## 2020-08-19 NOTE — Procedures (Signed)
Pre procedural Diagnosis: Poor venous access Post Procedural Diagnosis: Same  Successful ultrasound and fluoroscopic guided placement of a tunneled right internal jugular approach single-lumen central venous catheter with tip terminating within the superior cavoatrial junction.   Successful ultrasound and fluoroscopic guided placement of a right upper extremity approach dual lumen midline PICC with tip terminating within the right axillary vein.   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   EBL: None No immediate post procedural complication.  Katherina Right, MD Pager #: [phone]

## 2020-08-19 NOTE — TOC Progression Note (Signed)
Transition of Care [facility]) - Progression Note    Patient Details  Name: Rowan Pollman MRN: [id number] Date of Birth: 1961/02/21  Transition of Care [facility]) CM/SW Contact  Coralyn Helling, [place] Phone Number: 08/19/2020, 11:02 AM  Clinical Narrative:   Updated facility that patient is not ready for dc today. Possibly tomorrow.     Expected Discharge Plan: Skilled Nursing Facility Barriers to Discharge: Continued Medical Work up  Expected Discharge Plan and Services Expected Discharge Plan: Skilled Nursing Facility In-house Referral: Clinical Social Work     Living arrangements for the past 2 months: Single Family Home,Skilled Nursing Facility (Pt recently at SNF x 2 weeks then home alone x 1 week with rehospitalization)                                       Social Determinants of Health (SDOH) Interventions    Readmission Risk Interventions No flowsheet data found.

## 2020-08-19 NOTE — Telephone Encounter (Signed)
Lab ordered for abd ct placed

## 2020-08-19 NOTE — Progress Notes (Signed)
Physical Therapy Treatment Patient Details Name: Valerie [name] MRN: [id number] DOB: 06-29-1961 Today's Date: 08/19/2020    History of Present Illness 59 y.o. 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 Dx of hypercalcemia, AKI.    PT Comments    Assisted pt OOB to amb.  General bed mobility comments: increased time and use of bed pad to complete scooting to EOB  General transfer comment: from elevated bed with increased effort and use of walker to steady self.  25% VC's on proper hand placement to control decend as pt tends to sit quickly due to fatigue. General Gait Details: + 2 assist for safety such that recliner is following as B knees buckle with weakness without warning.  Tolerated an increased distance but no where near her prior level of mobility.   Follow Up Recommendations  SNF     Equipment Recommendations  None recommended by PT    Recommendations for Other Services       Precautions / Restrictions Precautions Precautions: Fall Precaution Comments: knees buckle Restrictions Weight Bearing Restrictions: No    Mobility  Bed Mobility Overal bed mobility: Needs Assistance Bed Mobility: Supine to Sit     Supine to sit: Min assist     General bed mobility comments: increased time and use of bed pad to complete scooting to EOB  Transfers Overall transfer level: Needs assistance Equipment used: Rolling walker (2 wheeled);None Transfers: Sit to/from [employer] Sit to Stand: Min assist         General transfer comment: from elevated bed with increased effort and use of walker to steady  self.  25% VC's on proper hand placement to control decend as pt tends to sit quickly due to fatigue.  Ambulation/Gait Ambulation/Gait assistance: Min assist Gait Distance (Feet): 38 Feet Assistive device: Rolling walker (2 wheeled) Gait Pattern/deviations: Step-through pattern;Decreased stride length Gait velocity: decr   General Gait Details: + 2 assist for safety such that recliner is following as B knees buckle with weakness without warning.  Tolerated an increased distance but no where near her prior level of mobility.   Stairs             Wheelchair Mobility    Modified Rankin (Stroke Patients Only)       Balance                                            Cognition Arousal/Alertness: Awake/alert Behavior During Therapy: WFL for tasks assessed/performed Overall Cognitive Status: Within Functional Limits for tasks assessed                                 General Comments: AxO x 3 very pleasant      Exercises      General Comments        Pertinent Vitals/Pain Pain Assessment: No/denies pain    Home Living                      Prior Function  PT Goals (current goals can now be found in the care plan section) Progress towards PT goals: Progressing toward goals    Frequency    Min 2X/week      PT Plan Current plan remains appropriate    Co-evaluation              AM-PAC PT "6 Clicks" Mobility   Outcome Measure  Help needed turning from your back to your side while in a flat bed without using bedrails?: A Lot Help needed moving from lying on your back to sitting on the side of a flat bed without using bedrails?: A Lot Help needed moving to and from a bed to a chair (including a wheelchair)?: A Lot Help needed standing up from a chair using your arms (e.g., wheelchair or bedside chair)?: A Lot Help needed to walk in hospital room?: A Lot Help needed climbing 3-5 steps with a railing?  : Total 6 Click Score: 11    End of Session Equipment Utilized During Treatment: Gait belt Activity Tolerance: Patient limited by fatigue Patient left: in chair;with call bell/phone within reach;with chair alarm set Nurse Communication: Mobility status PT Visit Diagnosis: Unsteadiness on feet (R26.81)     Time: 9604-5409 PT Time Calculation (min) (ACUTE ONLY): 18 min  Charges:  $Gait Training: 8-22 mins                     Rica Koyanagi  PTA Acute  Rehabilitation Services Pager      [phone] Office      [phone]

## 2020-08-20 ENCOUNTER — Ambulatory Visit: Payer: Medicare Other | Admitting: Internal Medicine

## 2020-08-20 LAB — UPEP/UIFE/LIGHT CHAINS/TP, 24-HR UR
Free Kappa Lt Chains,Ur: 82.65 mg/L (ref 0.63–113.79)
Free Kappa/Lambda Ratio: 4.63 (ref 1.03–31.76)
Free Lambda Lt Chains,Ur: 17.85 mg/L — ABNORMAL HIGH (ref 0.47–11.77)
Total Protein, Urine-Ur/day: 267 mg/24 hr — ABNORMAL HIGH (ref 30–150)
Total Protein, Urine: 16.7 mg/dL
Total Volume: 1600

## 2020-08-20 LAB — AEROBIC/ANAEROBIC CULTURE W GRAM STAIN (SURGICAL/DEEP WOUND): Special Requests: NORMAL

## 2020-08-20 LAB — SARS CORONAVIRUS 2 (TAT 6-24 HRS): SARS Coronavirus 2: NEGATIVE

## 2020-08-20 MED ORDER — TAB-A-VITE/IRON PO TABS
1.0000 | ORAL_TABLET | Freq: Every day | ORAL | Status: DC
  Administered 2020-08-20 – 2020-08-21 (×2): 1 via ORAL
  Filled 2020-08-20 (×2): qty 1

## 2020-08-20 MED ORDER — SACCHAROMYCES BOULARDII 250 MG PO CAPS
250.0000 mg | ORAL_CAPSULE | Freq: Two times a day (BID) | ORAL | Status: DC
  Administered 2020-08-20 – 2020-08-21 (×3): 250 mg via ORAL
  Filled 2020-08-20 (×3): qty 1

## 2020-08-20 NOTE — TOC Progression Note (Addendum)
Transition of Care [facility]) - Progression Note    Patient Details  Name: Valerie [name] MRN: [id number] Date of Birth: 20-Mar-1961  Transition of Care [facility]) CM/SW Contact  Clearance Coots, LCSW Phone Number: 08/20/2020, 11:39 AM  Clinical Narrative:    CSW discussed SNF with the patient and her daughter Valerie [name]. CSW reached out to [facility] snf per daughter request. CSW provided update to daughter, the SNF is still not accepting patients at this time due to covid. Patient and her daughter are still agreeable to [facility] for short rehab. Patient previous authorization expired.  [facility] Authorization submitted. Pending Auth. Reference# [id number]. Clinicals faxed. Physician notified to order a covid test.    Expected Discharge Plan: Skilled Nursing Facility Barriers to Discharge: Insurance Authorization  Expected Discharge Plan and Services Expected Discharge Plan: Skilled Nursing Facility In-house Referral: Clinical Social Work     Living arrangements for the past 2 months: Single Family Home,Skilled Nursing Facility (Pt recently at SNF x 2 weeks then home alone x 1 week with rehospitalization)                                       Social Determinants of Health (SDOH) Interventions    Readmission Risk Interventions No flowsheet data found.

## 2020-08-20 NOTE — Telephone Encounter (Signed)
Called patient she is still in hospital at [facility] wants to know if it can be ordered there why she is in hospital states she could be going to rehab once discharged   Thanks  Claris Che

## 2020-08-20 NOTE — Plan of Care (Signed)

## 2020-08-20 NOTE — Progress Notes (Signed)
CC: Weakness, multiple falls, lethargy, recurrent UTI  Subjective: Appears comfortable in bed.  There is very little in the IR drainage bag but it does appear to be a white purulent fluid still in the bag.  She was able to walk to the into the hall but could not walk back on her own yesterday.  She is tolerating this diet with supplements.  She is still using her CPAP but is off O2.  She does not know where she is going to be discharged to.  She lives alone.  Her last experience with rehab was very negative.  She reports that case manager is working on finding her a [facility] facility for discharge.  She is not using her incentive spirometry.  Objective: Vital signs in last 24 hours: Temp:  [98 F (36.7 C)-98.6 F (37 C)] 98.6 F (37 C) (12/28 0606) Pulse Rate:  [47-51] 51 (12/28 0606) Resp:  [14-18] 18 (12/28 0606) BP: (126-140)/(61-75) 128/75 (12/28 0606) SpO2:  [96 %-98 %] 96 % (12/28 0606) Last BM Date: 08/18/20 840 p.o. 600 IV 2950 urine Drain 30 BM x3 Afebrile vital signs are stable      Intake/Output from previous day: 12/27 0701 - 12/28 0700 In: 1464.3 [P.O.:840; I.V.:300; IV Piggyback:324.3] Out: 2980 [Urine:2950; Drains:30] Intake/Output this shift: No intake/output data recorded.  General appearance: alert, cooperative and no distress Resp: She has some rhonchi and rales in both bases.  Otherwise clear. GI: Soft, nontender, all the port sites look great.  The IR drain is in place and has a white purulent fluid in the bag.  Lab Results:  Recent Labs    08/19/20 0408  WBC 7.8  HGB 8.1*  HCT 27.8*  PLT 215    BMET Recent Labs    08/18/20 0433 08/19/20 0408  NA 146* 143  K 3.4* 4.0  CL 113* 112*  CO2 24 25  GLUCOSE 84 92  BUN 14 11  CREATININE 1.48* 1.36*  CALCIUM 7.9* 7.9*   PT/INR No results for input(s): LABPROT, INR in the last 72 hours.  Recent Labs  Lab 08/14/20 0350 08/19/20 0408  AST  --  13*  ALT  --  13  ALKPHOS  --  49   BILITOT  --  0.4  PROT  --  4.9*  ALBUMIN 2.5* 2.6*     Lipase     Component Value Date/Time   LIPASE 26 05/14/2020 1514     Medications: . ALPRAZolam  1 mg Oral BID  . vitamin C  1,000 mg Oral QHS  . aspirin EC  81 mg Oral Daily  . Chlorhexidine Gluconate Cloth  6 each Topical Daily  . donepezil  10 mg Oral Daily  . enoxaparin (LOVENOX) injection  40 mg Subcutaneous Q24H  . escitalopram  30 mg Oral Daily  . ferrous sulfate  325 mg Oral Q breakfast  . lamoTRIgine  150 mg Oral BID  . nadolol  10 mg Oral Daily  . pantoprazole  40 mg Oral Daily  . polyethylene glycol  17 g Oral Daily  . predniSONE  10 mg Oral Q breakfast  . rosuvastatin  5 mg Oral q1800  . sodium chloride flush  10-40 mL Intracatheter Q12H  . sodium chloride flush  3 mL Intravenous Q12H  . sodium chloride flush  5 mL Intracatheter Q8H   . 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 Karie Soda IR drain placement 08/16/2020 IR ultrasound-guided vascular access 08/19/2020  FEN: Regular diet ID: Keflex 12/20-12/23; Rocephin 12/24-12/26; Flagyl 12/24-12/26;        Zosyn 12/26 >> day 3 (4 weeks thru 09/17/19 -Dr. Rutha Bouchard -ID) DVT: Lovenox Follow-up: Dr. Michaell Cowing Pain: Vicodin 1 tablet x  4;  Plan: Her plan is to go to rehab if they can find a new an acceptable facility.  Otherwise she did have to go home with help.  She lives alone and currently has no help.  She has an IR drain recommendations for IV Zosyn through 09/17/2019. I have added a multivitamin with iron in the a.m., along with a probiotic.  We will need decide when radiology is going to reimage her abdomen; then set her up with follow-up with Dr. Michaell Cowing after discharge.  She has been seen by TOC.  Will await their recommendations.   LOS: 10 days    Valerie [name] 08/20/2020 Please see Amion

## 2020-08-20 NOTE — Progress Notes (Signed)
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  She was found to have hypercalcelcemia, UTI.  CT cystogram done on 12/23 showed persistent colovesical fistula with abscess and free air in the abdomen.  She underwent IR guided drain placement on 12/24.  Culture showed multiple organisms.  ID, general surgery, IR were following.  She had a prolonged hospital course.  Currently she is on Zosyn .she also underwent tunneled cathter  placement for outpatient antibiotic.  She was recommended skilled nursing facility placement by PT and OT.  Plan for discharge to skilled nursing facility when authorization is complete.  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  Currently she alert and oriented.  Colovesical fistula with abscess: She was recently admitted here for diverticulitis, perforation/colovesical fistula status post repair.  CT cystogram on 12/23 showed persistent colovesical  fistula with abscess and free air in the abdomen.  IR placed a LLQ drain on 12/24.  Wound Culture showed  multiple organisms,but mostly Ecoli.  Currently on Zosyn.  ID were following.   Underwent tunneled right IJ central line placed.ID recommended to continue Zosyn through 09/16/2020.  She has an appointment with ID, Dr. Linus Salmons on 09/12/2020.   She will be discharged on foley cathter and also follow up with urology ,Dr Rutherford Limerick an outpatient. Follow up with general surgery as an outpatient.  AKI on CKD stage IIIb: Currently kidney function at baseline.  Anemia of chronic disease: Currently hemoglobin is stable in the range of 8.  Monitor as an outpatient.  She was given a dose of IV iron infusion during this hospitalization.  Hypercalcemia: Likely primary hyperparathyroidism.  Presented  with generalized weakness, encephalopathy, constipation.  TSH normal.  Vitamin D level normal. Calcitriol level unremarkable,PTH is mildly elevated at 73, PTH like protein in process, 24hr urine calcium unremarkabble Calcium on presentation was 13.4, she appeared  dehydrated, she received hydration ,  Zometa and calcitonin nasal spray -calcium is normal now, constipation resolved,  -Case discussed with endocrinology Dr. Benjiman Core who recommend outpatient follow-up with her in 2 to 3 weeks, recommend repeat calcium  in 1 week by PCP  Electrolyte abnormalities: Being  Monitored and supplemented  Psoriatic arthritis On prednisone 10 mg daily Follow-up with PCP/rheumatology  History of CAD :status post non-STEMI March 2021, history of CVA  Was on Plavix, now on asa only Continue crestor  Continue nadolol  HTN; On nadalol  History bipolar disorder-On Lamictal, Lexapro  H/o mild dementia On aricept , aaox3, with mild memory deficit, mental status currently at baseline  OSA on nightly cpap Body mass index is 32.54 kg/m.   debility/frequent falls Physical therapy, PT/OT recommended skilled  nursing facility discharge.  Waiting for authorization        DVT prophylaxis:Lovenox Code Status: Full Family Communication: called and discussed with daughter on phone today Status is: Inpatient  Remains inpatient appropriate because:Inpatient level of care appropriate due to severity of illness   Dispo: The patient is from: Home              Anticipated d/c is to: SNF              Anticipated d/c date is: 1 day              Patient currently is not medically stable to d/c.   Consultants: ID,IR,surgery  Procedures:IR drain   Antimicrobials:  Anti-infectives (From admission, onward)   Start     Dose/Rate Route Frequency Ordered Stop   08/18/20 1830  piperacillin-tazobactam (ZOSYN) IVPB 3.375 g  Status:  Discontinued       Note to Pharmacy: If extended infusion is available, 3.375 q8hrs dosing is ok, if not, then regular dosing q6hours   3.375 g 100 mL/hr over 30 Minutes Intravenous Every 6 hours 08/18/20 1732 08/18/20 1756   08/18/20 1800  piperacillin-tazobactam (ZOSYN) IVPB 3.375 g        3.375 g 12.5 mL/hr over 240 Minutes Intravenous Every 8 hours 08/18/20 1756     08/16/20 1400  metroNIDAZOLE (FLAGYL) tablet 500 mg  Status:  Discontinued        500 mg Oral Every 8 hours 08/16/20 1132 08/18/20 1732   08/16/20 1200  Ampicillin-Sulbactam (UNASYN) 3 g in sodium chloride 0.9 % 100 mL IVPB  Status:  Discontinued        3 g 200 mL/hr over 30 Minutes Intravenous Every 6 hours 08/16/20 1123 08/16/20 1131   08/16/20 1145  cefTRIAXone (ROCEPHIN) 2 g in sodium chloride 0.9 % 100 mL IVPB  Status:  Discontinued        2 g 200 mL/hr over 30 Minutes Intravenous Daily 08/16/20 1131 08/18/20 1732   08/15/20 2200  cephALEXin (KEFLEX) capsule 500 mg        500 mg Oral Every 12 hours 08/15/20 1512 08/15/20 2011   08/12/20 1800  cephALEXin (KEFLEX) capsule 500 mg  Status:  Discontinued        500 mg Oral Every 12 hours 08/12/20 1327 08/15/20 1512   08/11/20 1730  cefTRIAXone (ROCEPHIN)  1 g in sodium chloride 0.9 % 100 mL IVPB  Status:  Discontinued        1 g 200 mL/hr over 30 Minutes Intravenous Every 24 hours 08/11/20 1631 08/12/20 1327      Subjective: Patient seen and examined at bedside this morning.  Hemodynamically stable.  Sitting in the chair.  Comfortable without any new complaints.  Objective: Vitals:   08/19/20 1457 08/19/20 2200 08/19/20 2356 08/20/20 0606  BP: 140/64 126/67  128/75  Pulse: (!) 48 (!) 47  (!) 51  Resp: '14 18 16 18  ' Temp: 98.2 F (36.8 C) 98 F (36.7 C)  98.6 F (37 C)  TempSrc: Oral Oral  Oral  SpO2: 97% 96%  96%  Weight:      Height:        Intake/Output Summary (Last 24 hours) at 08/20/2020 1145 Last data filed at 08/20/2020 1000 Gross per 24 hour  Intake 1354.62 ml  Output 3030 ml  Net -1675.38 ml  Filed Weights   08/09/20 2135  Weight: 80.7 kg    Examination:   General exam: Comfortable, pleasant female Respiratory system: Bilateral equal air entry, normal vesicular breath sounds, no wheezes or crackles  Cardiovascular system: S1 & S2 heard, RRR. No JVD, murmurs, rubs, gallops or clicks.  Central line on the right chest Gastrointestinal system: Abdomen is nondistended, soft and nontender. No organomegaly or masses felt. Normal bowel sounds heard.  Left lower quadrant drain, Foley catheter Central nervous system: Alert and oriented. No focal neurological deficits. Extremities: No edema, no clubbing ,no cyanosis Skin: No rashes, lesions or ulcers,no icterus ,no pallor    Data Reviewed: I have personally reviewed following labs and imaging studies  CBC: Recent Labs  Lab 08/15/20 0535 08/16/20 0608 08/19/20 0408  WBC 9.5 6.9 7.8  NEUTROABS 6.9  --  5.9  HGB 8.3* 8.6* 8.1*  HCT 26.7* 27.9* 27.8*  MCV 100.8* 100.7* 104.9*  PLT 183 194 712   Basic Metabolic Panel: Recent Labs  Lab 08/14/20 0350 08/15/20 0535 08/16/20 0608 08/18/20 0433 08/19/20 0408  NA 141 140 142 146* 143  K 3.6 3.3* 3.6 3.4*  4.0  CL 108 108 111 113* 112*  CO2 24 21* '24 24 25  ' GLUCOSE 76 93 90 84 92  BUN '13 11 13 14 11  ' CREATININE 1.32* 1.32* 1.41* 1.48* 1.36*  CALCIUM 8.9 8.3* 8.2* 7.9* 7.9*  MG  --   --  2.3  --   --   PHOS 1.8* 1.9* 2.2* 3.0  --    GFR: Estimated Creatinine Clearance: 43.3 mL/min (A) (by C-G formula based on SCr of 1.36 mg/dL (H)). Liver Function Tests: Recent Labs  Lab 08/14/20 0350 08/19/20 0408  AST  --  13*  ALT  --  13  ALKPHOS  --  49  BILITOT  --  0.4  PROT  --  4.9*  ALBUMIN 2.5* 2.6*   No results for input(s): LIPASE, AMYLASE in the last 168 hours. No results for input(s): AMMONIA in the last 168 hours. Coagulation Profile: Recent Labs  Lab 08/16/20 0608  INR 1.1   Cardiac Enzymes: No results for input(s): CKTOTAL, CKMB, CKMBINDEX, TROPONINI in the last 168 hours. BNP (last 3 results) No results for input(s): PROBNP in the last 8760 hours. HbA1C: No results for input(s): HGBA1C in the last 72 hours. CBG: No results for input(s): GLUCAP in the last 168 hours. Lipid Profile: No results for input(s): CHOL, HDL, LDLCALC, TRIG, CHOLHDL, LDLDIRECT in the last 72 hours. Thyroid Function Tests: No results for input(s): TSH, T4TOTAL, FREET4, T3FREE, THYROIDAB in the last 72 hours. Anemia Panel: No results for input(s): VITAMINB12, FOLATE, FERRITIN, TIBC, IRON, RETICCTPCT in the last 72 hours. Sepsis Labs: Recent Labs  Lab 08/18/20 0433  LATICACIDVEN 1.0    Recent Results (from the past 240 hour(s))  SARS Coronavirus 2 by RT PCR (hospital order, performed in [facility] hospital lab) Nasopharyngeal Nasopharyngeal Swab     Status: None   Collection Time: 08/14/20 11:59 PM   Specimen: Nasopharyngeal Swab  Result Value Ref Range Status   SARS Coronavirus 2 NEGATIVE NEGATIVE Final    Comment: (NOTE) SARS-CoV-2 target nucleic acids are NOT DETECTED.  The SARS-CoV-2 RNA is generally detectable in upper and lower respiratory specimens during the acute phase of  infection. The lowest concentration of SARS-CoV-2 viral copies this assay can detect is 250 copies / mL. A negative result does not preclude SARS-CoV-2 infection and should not be used as  the sole basis for treatment or other patient management decisions.  A negative result may occur with improper specimen collection / handling, submission of specimen other than nasopharyngeal swab, presence of viral mutation(s) within the areas targeted by this assay, and inadequate number of viral copies (<250 copies / mL). A negative result must be combined with clinical observations, patient history, and epidemiological information.  Fact Sheet for Patients:   StrictlyIdeas.no  Fact Sheet for Healthcare Providers: BankingDealers.co.za  This test is not yet approved or  cleared by the [place] FDA and has been authorized for detection and/or diagnosis of SARS-CoV-2 by FDA under an Emergency Use Authorization (EUA).  This EUA will remain in effect (meaning this test can be used) for the duration of the COVID-19 declaration under Section 564(b)(1) of the Act, 21 U.S.C. section 360bbb-3(b)(1), unless the authorization is terminated or revoked sooner.  Performed at [facility], [place] [address] [place], [place] [address]   Aerobic/Anaerobic Culture (surgical/deep wound)     Status: None (Preliminary result)   Collection Time: 08/16/20  8:42 PM   Specimen: Abscess  Result Value Ref Range Status   Specimen Description   Final    ABSCESS Performed at [place] [address] [place], [place] [address]    Special Requests   Final    Normal Performed at [facility], [place] [address] [place], [place] [address]    Gram Stain   Final    ABUNDANT WBC PRESENT, PREDOMINANTLY PMN ABUNDANT GRAM POSITIVE COCCI MODERATE GRAM NEGATIVE RODS MODERATE GRAM POSITIVE RODS    Culture   Final     MODERATE ESCHERICHIA COLI ABUNDANT ENTEROCOCCUS AVIUM SUSCEPTIBILITIES TO FOLLOW HOLDING FOR POSSIBLE ANAEROBE Performed at [facility], [place] [address], [place], [place] [address]    Report Status PENDING  Incomplete   Organism ID, Bacteria ESCHERICHIA COLI  Final      Susceptibility   Escherichia coli - MIC*    AMPICILLIN >=32 RESISTANT Resistant     CEFAZOLIN <=4 SENSITIVE Sensitive     CEFEPIME <=0.12 SENSITIVE Sensitive     CEFTAZIDIME <=1 SENSITIVE Sensitive     CEFTRIAXONE <=0.25 SENSITIVE Sensitive     CIPROFLOXACIN 0.5 SENSITIVE Sensitive     GENTAMICIN <=1 SENSITIVE Sensitive     IMIPENEM <=0.25 SENSITIVE Sensitive     TRIMETH/SULFA <=20 SENSITIVE Sensitive     AMPICILLIN/SULBACTAM >=32 RESISTANT Resistant     PIP/TAZO <=4 SENSITIVE Sensitive     * MODERATE ESCHERICHIA COLI         Radiology Studies: IR Fluoro Guide CV Line Right  Result Date: 08/19/2020 INDICATION: Poor venous access. In need of durable intravenous access for medication administration and blood draws. Patient with history of previous failed attempted bedside PICC line placement. EXAM: 1. ULTRASOUND AND FLUOROSCOPIC GUIDED PICC LINE INSERTION 2. ULTRASOUND AND FLUOROSCOPIC GUIDED PLACEMENT OF A TUNNELED CENTRAL VENOUS CATHETER MEDICATIONS: None. CONTRAST:  10 cc Omnipaque 300 FLUOROSCOPY TIME:  1 minute, 24 seconds (379 mGy) COMPLICATIONS: None immediate. TECHNIQUE: The procedure, risks, benefits, and alternatives were explained to the patient and informed written consent was obtained. A timeout was performed prior to the initiation of the procedure. The right upper extremity was prepped with chlorhexidine in a sterile fashion, and a sterile drape was applied covering the operative field. Maximum barrier sterile technique with sterile gowns and gloves were used for the procedure. A timeout was performed prior to the initiation of the procedure. Local anesthesia  was provided with 1% lidocaine. Under  direct ultrasound guidance, the brachial vein was accessed with a micropuncture kit after the overlying soft tissues were anesthetized with 1% lidocaine. After the overlying soft tissues were anesthetized, a small venotomy incision was created and a micropuncture kit was utilized to access the right basilic vein. Real-time ultrasound guidance was utilized for vascular access including the acquisition of a permanent ultrasound image documenting patency of the accessed vessel. As was difficulty advancing guidewire to the level of the superior cavoatrial junction. Limited contrast injection was performed via the peel-away sheath demonstrating occlusion of the right subclavian vein with development of hypertrophied supraclavicular venous collaterals. As such, decision was made to place a midline catheter. A guidewire was advanced to the level of the right axilla for measurement purposes and the PICC line was cut to length. A peel-away sheath was placed and a 21 cm, 5 [place], dual lumen was inserted to level of the right axillary vein. A post procedure spot fluoroscopic was obtained. The catheter easily aspirated and flushed and was secured in place with stat lock device. A dressing was applied. _________________________________________________________ As patient is in need of durable intravenous access and has difficulty maintaining patency of previous midline catheters, the decision was made to proceed with placement of a definitive tunneled central venous catheter, The skin overlying the base of the right neck and right upper chest were prepped and draped in usual sterile fashion. Local anesthesia was provided with 1% lidocaine. Under direct ultrasound guidance, the right internal jugular vein was accessed with a micropuncture kit after the overlying soft tissues were anesthetized with 1% lidocaine. After the overlying soft tissues were anesthetized, a small venotomy incision was created and a micropuncture kit was  utilized to access the right internal jugular vein. Real-time ultrasound guidance was utilized for vascular access including the acquisition of a permanent ultrasound image documenting patency of the accessed vessel. The tunneled single-lumen central venous catheter was then tunneled from the right upper chest to the venotomy site. The guidewire was utilized for measurement purposes and the catheter was inserted via the peel-away sheath with tip ultimately terminating within the superior cavoatrial junction. A tunneled central venous catheter was noted to easily aspirate and flush was secured in place within interrupted suture. Dressings were applied. The patient tolerated the above procedures well without immediate postprocedural complication FINDINGS: After catheter placement, the right upper extremity approach midline dual lumen PICC line tip lies within the right axillary vein. After catheter placement, the right internal jugular approach tunneled single-lumen central venous catheter tip lies within the superior cavoatrial junction. Both catheters easily aspirate and flush and both are ready for immediate use. IMPRESSION: 1. Successful ultrasound and fluoroscopic guided placement of a tunneled right internal jugular approach single-lumen central venous catheter with tip terminating within the superior cavoatrial junction. 2. Successful ultrasound and fluoroscopic guided placement of a right upper extremity approach midline PICC with tip terminating within the right axillary vein. 3. 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 Electronically Signed   By: Sandi Mariscal M.D.   On: 08/19/2020 10:55   IR US Guide Vasc Access Right  Result Date: 08/19/2020 INDICATION: Poor venous access. In need of durable intravenous access for medication administration and blood draws. Patient with history of  previous failed attempted bedside PICC line placement. EXAM: 1. ULTRASOUND AND FLUOROSCOPIC GUIDED PICC LINE INSERTION 2. ULTRASOUND AND FLUOROSCOPIC GUIDED PLACEMENT OF A TUNNELED  CENTRAL VENOUS CATHETER MEDICATIONS: None. CONTRAST:  10 cc Omnipaque 300 FLUOROSCOPY TIME:  1 minute, 24 seconds (528 mGy) COMPLICATIONS: None immediate. TECHNIQUE: The procedure, risks, benefits, and alternatives were explained to the patient and informed written consent was obtained. A timeout was performed prior to the initiation of the procedure. The right upper extremity was prepped with chlorhexidine in a sterile fashion, and a sterile drape was applied covering the operative field. Maximum barrier sterile technique with sterile gowns and gloves were used for the procedure. A timeout was performed prior to the initiation of the procedure. Local anesthesia was provided with 1% lidocaine. Under direct ultrasound guidance, the brachial vein was accessed with a micropuncture kit after the overlying soft tissues were anesthetized with 1% lidocaine. After the overlying soft tissues were anesthetized, a small venotomy incision was created and a micropuncture kit was utilized to access the right basilic vein. Real-time ultrasound guidance was utilized for vascular access including the acquisition of a permanent ultrasound image documenting patency of the accessed vessel. As was difficulty advancing guidewire to the level of the superior cavoatrial junction. Limited contrast injection was performed via the peel-away sheath demonstrating occlusion of the right subclavian vein with development of hypertrophied supraclavicular venous collaterals. As such, decision was made to place a midline catheter. A guidewire was advanced to the level of the right axilla for measurement purposes and the PICC line was cut to length. A peel-away sheath was placed and a 21 cm, 5 [place], dual lumen was inserted to level of the right axillary vein. A post  procedure spot fluoroscopic was obtained. The catheter easily aspirated and flushed and was secured in place with stat lock device. A dressing was applied. _________________________________________________________ As patient is in need of durable intravenous access and has difficulty maintaining patency of previous midline catheters, the decision was made to proceed with placement of a definitive tunneled central venous catheter, The skin overlying the base of the right neck and right upper chest were prepped and draped in usual sterile fashion. Local anesthesia was provided with 1% lidocaine. Under direct ultrasound guidance, the right internal jugular vein was accessed with a micropuncture kit after the overlying soft tissues were anesthetized with 1% lidocaine. After the overlying soft tissues were anesthetized, a small venotomy incision was created and a micropuncture kit was utilized to access the right internal jugular vein. Real-time ultrasound guidance was utilized for vascular access including the acquisition of a permanent ultrasound image documenting patency of the accessed vessel. The tunneled single-lumen central venous catheter was then tunneled from the right upper chest to the venotomy site. The guidewire was utilized for measurement purposes and the catheter was inserted via the peel-away sheath with tip ultimately terminating within the superior cavoatrial junction. A tunneled central venous catheter was noted to easily aspirate and flush was secured in place within interrupted suture. Dressings were applied. The patient tolerated the above procedures well without immediate postprocedural complication FINDINGS: After catheter placement, the right upper extremity approach midline dual lumen PICC line tip lies within the right axillary vein. After catheter placement, the right internal jugular approach tunneled single-lumen central venous catheter tip lies within the superior cavoatrial junction.  Both catheters easily aspirate and flush and both are ready for immediate use. IMPRESSION: 1. Successful ultrasound and fluoroscopic guided placement of a tunneled right internal jugular approach single-lumen central venous catheter with tip terminating within the superior cavoatrial junction. 2. Successful ultrasound and fluoroscopic guided placement of a right upper extremity approach midline  PICC with tip terminating within the right axillary vein. 3. 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 Electronically Signed   By: Sandi Mariscal M.D.   On: 08/19/2020 10:55   IR PICC PLACEMENT RIGHT >5 YRS INC IMG GUIDE  Result Date: 08/19/2020 INDICATION: Poor venous access. In need of durable intravenous access for medication administration and blood draws. Patient with history of previous failed attempted bedside PICC line placement. EXAM: 1. ULTRASOUND AND FLUOROSCOPIC GUIDED PICC LINE INSERTION 2. ULTRASOUND AND FLUOROSCOPIC GUIDED PLACEMENT OF A TUNNELED CENTRAL VENOUS CATHETER MEDICATIONS: None. CONTRAST:  10 cc Omnipaque 300 FLUOROSCOPY TIME:  1 minute, 24 seconds (001 mGy) COMPLICATIONS: None immediate. TECHNIQUE: The procedure, risks, benefits, and alternatives were explained to the patient and informed written consent was obtained. A timeout was performed prior to the initiation of the procedure. The right upper extremity was prepped with chlorhexidine in a sterile fashion, and a sterile drape was applied covering the operative field. Maximum barrier sterile technique with sterile gowns and gloves were used for the procedure. A timeout was performed prior to the initiation of the procedure. Local anesthesia was provided with 1% lidocaine. Under direct ultrasound guidance, the brachial vein was accessed with a micropuncture kit after the overlying soft tissues were anesthetized with 1% lidocaine.  After the overlying soft tissues were anesthetized, a small venotomy incision was created and a micropuncture kit was utilized to access the right basilic vein. Real-time ultrasound guidance was utilized for vascular access including the acquisition of a permanent ultrasound image documenting patency of the accessed vessel. As was difficulty advancing guidewire to the level of the superior cavoatrial junction. Limited contrast injection was performed via the peel-away sheath demonstrating occlusion of the right subclavian vein with development of hypertrophied supraclavicular venous collaterals. As such, decision was made to place a midline catheter. A guidewire was advanced to the level of the right axilla for measurement purposes and the PICC line was cut to length. A peel-away sheath was placed and a 21 cm, 5 [place], dual lumen was inserted to level of the right axillary vein. A post procedure spot fluoroscopic was obtained. The catheter easily aspirated and flushed and was secured in place with stat lock device. A dressing was applied. _________________________________________________________ As patient is in need of durable intravenous access and has difficulty maintaining patency of previous midline catheters, the decision was made to proceed with placement of a definitive tunneled central venous catheter, The skin overlying the base of the right neck and right upper chest were prepped and draped in usual sterile fashion. Local anesthesia was provided with 1% lidocaine. Under direct ultrasound guidance, the right internal jugular vein was accessed with a micropuncture kit after the overlying soft tissues were anesthetized with 1% lidocaine. After the overlying soft tissues were anesthetized, a small venotomy incision was created and a micropuncture kit was utilized to access the right internal jugular vein. Real-time ultrasound guidance was utilized for vascular access including the acquisition of a permanent  ultrasound image documenting patency of the accessed vessel. The tunneled single-lumen central venous catheter was then tunneled from the right upper chest to the venotomy site. The guidewire was utilized for measurement purposes and the catheter was inserted via the peel-away sheath with tip ultimately terminating within the superior cavoatrial junction. A tunneled central venous catheter was noted to easily aspirate and flush was secured in place within interrupted suture. Dressings were applied. The patient tolerated  the above procedures well without immediate postprocedural complication FINDINGS: After catheter placement, the right upper extremity approach midline dual lumen PICC line tip lies within the right axillary vein. After catheter placement, the right internal jugular approach tunneled single-lumen central venous catheter tip lies within the superior cavoatrial junction. Both catheters easily aspirate and flush and both are ready for immediate use. IMPRESSION: 1. Successful ultrasound and fluoroscopic guided placement of a tunneled right internal jugular approach single-lumen central venous catheter with tip terminating within the superior cavoatrial junction. 2. Successful ultrasound and fluoroscopic guided placement of a right upper extremity approach midline PICC with tip terminating within the right axillary vein. 3. 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 Electronically Signed   By: Sandi Mariscal M.D.   On: 08/19/2020 10:55        Scheduled Meds: . ALPRAZolam  1 mg Oral BID  . vitamin C  1,000 mg Oral QHS  . aspirin EC  81 mg Oral Daily  . Chlorhexidine Gluconate Cloth  6 each Topical Daily  . donepezil  10 mg Oral Daily  . enoxaparin (LOVENOX) injection  40 mg Subcutaneous Q24H  . escitalopram  30 mg Oral Daily  . ferrous sulfate  325 mg Oral Q breakfast  .  lamoTRIgine  150 mg Oral BID  . multivitamins with iron  1 tablet Oral Daily  . nadolol  10 mg Oral Daily  . pantoprazole  40 mg Oral Daily  . polyethylene glycol  17 g Oral Daily  . predniSONE  10 mg Oral Q breakfast  . rosuvastatin  5 mg Oral q1800  . saccharomyces boulardii  250 mg Oral BID  . sodium chloride flush  10-40 mL Intracatheter Q12H  . sodium chloride flush  3 mL Intravenous Q12H  . sodium chloride flush  5 mL Intracatheter Q8H   Continuous Infusions: . piperacillin-tazobactam (ZOSYN)  IV 3.375 g (08/20/20 0237)     LOS: 10 days    Time spent: 35 mins,More than 50% of that time was spent in counseling and/or coordination of care.      Shelly Coss, MD [facility] P12/28/2021, 11:45 AM

## 2020-08-21 DIAGNOSIS — F319 Bipolar disorder, unspecified: Secondary | ICD-10-CM | POA: Diagnosis not present

## 2020-08-21 DIAGNOSIS — N179 Acute kidney failure, unspecified: Secondary | ICD-10-CM | POA: Diagnosis not present

## 2020-08-21 DIAGNOSIS — G4733 Obstructive sleep apnea (adult) (pediatric): Secondary | ICD-10-CM

## 2020-08-21 DIAGNOSIS — N321 Vesicointestinal fistula: Secondary | ICD-10-CM | POA: Diagnosis not present

## 2020-08-21 DIAGNOSIS — F419 Anxiety disorder, unspecified: Secondary | ICD-10-CM | POA: Diagnosis not present

## 2020-08-21 DIAGNOSIS — G9341 Metabolic encephalopathy: Secondary | ICD-10-CM

## 2020-08-21 MED ORDER — HEPARIN SOD (PORK) LOCK FLUSH 100 UNIT/ML IV SOLN
250.0000 [IU] | INTRAVENOUS | Status: DC | PRN
  Administered 2020-08-21: 250 [IU]
  Filled 2020-08-21 (×2): qty 2.5

## 2020-08-21 MED ORDER — ALPRAZOLAM 1 MG PO TABS: 1.0000 mg | ORAL_TABLET | Freq: Two times a day (BID) | ORAL | 0 refills | Status: DC

## 2020-08-21 MED ORDER — PIPERACILLIN-TAZOBACTAM IV (FOR PTA / DISCHARGE USE ONLY): 12.5000 g | INTRAVENOUS | 0 refills | Status: DC

## 2020-08-21 MED ORDER — HYDROCODONE-ACETAMINOPHEN 10-325 MG PO TABS: 0.5000 | ORAL_TABLET | Freq: Four times a day (QID) | ORAL | 0 refills | Status: DC | PRN

## 2020-08-21 MED ORDER — TAB-A-VITE/IRON PO TABS: 1.0000 | ORAL_TABLET | Freq: Every day | ORAL | 0 refills | Status: DC

## 2020-08-21 MED ORDER — HEPARIN SOD (PORK) LOCK FLUSH 100 UNIT/ML IV SOLN
250.0000 [IU] | Freq: Every day | INTRAVENOUS | Status: DC
  Filled 2020-08-21: qty 2.5

## 2020-08-21 MED ORDER — FERROUS SULFATE 325 (65 FE) MG PO TABS: 325.0000 mg | ORAL_TABLET | Freq: Every day | ORAL | 3 refills | Status: DC

## 2020-08-21 MED ORDER — PHENAZOPYRIDINE HCL 100 MG PO TABS: 100.0000 mg | ORAL_TABLET | Freq: Three times a day (TID) | ORAL | 0 refills | Status: DC | PRN

## 2020-08-21 MED ORDER — ENOXAPARIN SODIUM 40 MG/0.4ML ~~LOC~~ SOLN: 40.0000 mg | SUBCUTANEOUS | Status: DC

## 2020-08-21 MED ORDER — SACCHAROMYCES BOULARDII 250 MG PO CAPS: 250.0000 mg | ORAL_CAPSULE | Freq: Two times a day (BID) | ORAL | Status: DC

## 2020-08-21 MED ORDER — POLYETHYLENE GLYCOL 3350 17 G PO PACK: 17.0000 g | PACK | Freq: Every day | ORAL | 0 refills | Status: DC

## 2020-08-21 NOTE — Discharge Summary (Signed)
Physician Discharge Summary  Valerie [name] HUD:[id number] DOB: [birth year] DOA: 08/09/2020  PCP: Otelia Santee I, MD  Admit date: 08/09/2020 Discharge date: 08/21/2020  Time spent: 65 minutes  Recommendations for Outpatient Follow-up:  1. Follow-up with Otelia Santee I, MD in 1 week. 2. Follow-up with MD at SNF 3. Follow-up with Dr. [name] cardiology as previously scheduled. 4. Follow-up with Dr. Benjiman Core in 2 weeks 5. Follow-up with Dr. Rexene Alberts, urology in 2 weeks 6. Follow-up with Dr. Johney Maine, general surgery September 03, 2020. 7. Follow-up in IR drain clinic. 8. Follow-up with Dr., Infectious disease, September 11, 2020 as scheduled.   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 Elray Buba is a 59 y.o. 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  Patient was accompanied by her  caregiver.  According to caregiver she is having multiple falls and unable to get up from floor, they have to call fire department twice in the last 2 days to help.  Patient is also very somnolent, sleeping randomly while talking.  Patient denies any nausea or vomiting.  Decreased appetite, but stating that her p.o. intake is improving.  No fever or chills.  No pain.  No shortness of breath or chest pain.  No urinary symptoms. Has a remote history of renal calculi.  ED Course: Hemodynamically stable, labs positive for neutrophilic predominant leukocytosis at 6.6, hemoglobin of 10.3, BUN 23, creatinine of 1.78 with baseline around 1.2-1.3, hypercalcemia with corrected calcium of 13.4, COVID-19 PCR negative, UA with positive dipstick, large leukocytes and many bacteria which seems chronic, might be a colonization.CT head without any acute abnormality.  Chest x-ray within normal limit.  Hospital Course:  #1 acute metabolic encephalopathy Patient presented acute metabolic encephalopathy felt likely secondary to hypercalcemia and UTI on presentation.  CT head done on admission negative for any acute abnormalities.  Patient on IV antibiotics.  Patient improved clinically.  Patient back to baseline by day of discharge.  Outpatient follow-up.  2.  Colovesicular fistula with abscess Patient noted to have recently been admitted for diverticulitis, perforation/colovesicular fistula status post repair.  CT cystogram done on 1223 showed persistent colovesicular fistula with abscess and free air in the abdomen.  IR consulted and left lower quadrant drain placed on August 16, 2020.  Wound cultures grew multiple organisms but mostly E. coli.  Patient placed empirically on IV Zosyn ID consulted.  ID recommended 4 weeks of IV antibiotics until September 16, 2020 with  weekly CBCs, c-Met, CRP with outpatient follow-up with ID, Dr.Comer September 11, 2020. Patient underwent tunneled right IJ central line placement per ID  recommendations to continue IV antibiotics.  Foley catheter was also placed and patient was seen by urology during the hospitalization and followed up by general surgery as well.  Outpatient follow-up with IR, urology, general surgery.  Patient be discharged in stable and improved condition.  3.  Acute kidney injury on chronic kidney disease stage IIIb Improved.  Renal function at baseline.  4.  Anemia of chronic disease Hemoglobin remained stable around 8.  Patient received a dose of IV iron infusion during the hospitalization.  Patient's Plavix was held but will be resumed on discharge in addition to aspirin.  Outpatient follow-up.  5.  Hypercalcemia Felt likely secondary to primary hyperparathyroidism.  Patient had presented with generalized weakness, encephalopathy, constipation.  TSH done was within normal limits.  Vitamin D level was normal.  Calcitriol level was unremarkable.  PTH mildly elevated at 73.  PTH like protein pending.  24-hour urine calcium unremarkable.  Calcium on presentation was 13.4.  Patient noted to be dehydrated received IV fluids Zometa, calcitonin nasal spray.  Calcium normalized and constipation resolved.  Dr. Tawanna Solo discussed with Dr. Beverly Sessions who recommended outpatient follow-up with her in 2 to 3 weeks and recommended repeat calcium levels in 1 week per PCP.  6.  Psoriatic arthritis Patient maintained on prednisone during the hospitalization.  Outpatient follow-up with PCP or rheumatology.  7.  History of coronary artery disease status post non-STEMI March 2021/history of CVA Patient noted to be have been on Plavix and aspirin.  Patient's Plavix was held during the hospitalization.  Patient maintained on aspirin.  Plavix will be resumed on discharge.  Outpatient follow-up with cardiology.  8.  Hypertension Controlled on home regimen of nadolol.  9.  Bipolar disorder Remained stable.  Patient maintained on home regimen of Lamictal and Lexapro.  10.   History of mild dementia Patient maintained on home regimen of Aricept.  11.  OSA Patient maintained on home regimen CPAP nightly.  12.  Debility/frequent falls Patient seen by PT OT SNF was recommended.  Patient be discharged to SNF.  Procedures:  CT abdomen and pelvis 08/15/2020  CT head 08/09/2020  Chest x-ray 08/09/2020  Ultrasound and fluoroscopic guided PICC line insertion/ultrasound and fluoroscopic guided placement of a tunneled central venous catheter per IR Dr. Pascal Lux 08/19/2020  CT-guided pelvic abscess drain catheter placement per Dr. Vernard Gambles 08/17/2020    Consultations:  Urology: Dr. Abner Greenspan 08/15/2020  General surgery: Dr. Redmond Pulling 08/15/2020  Infectious disease: Dr. Baxter Flattery 08/16/2020  Discharge Exam: Vitals:   08/21/20 0557 08/21/20 1407  BP: 116/64 132/66  Pulse: (!) 48 62  Resp: 17 16  Temp: 98.1 F (36.7 C) 97.6 F (36.4 C)  SpO2: 97% 96%    General: NAD Cardiovascular: RRR Respiratory: CTAB anterior lung fields.  No wheezes, no crackles, no rhonchi.  Discharge Instructions   Discharge Instructions    [facility] pharmacist to adjust dose for Vancomycin, Aminoglycosides and other anti-infective therapies as requested by physician.   Complete by: As directed    [facility] to provide Cath Flo 47m   Complete by: As directed    Administer for PICC line occlusion and as ordered by physician for other access device issues.   Anaphylaxis Kit: Provided to treat any anaphylactic reaction to the medication being provided to the patient if First Dose or when requested by physician  Complete by: As directed    Epinephrine 36m/ml vial / amp: Administer 0.347m(0.91m68msubcutaneously once for moderate to severe anaphylaxis, nurse to call physician and pharmacy when reaction occurs and call 911 if needed for immediate care   Diphenhydramine 87m42m IV vial: Administer 25-87mg32mIM PRN for first dose reaction, rash, itching, mild reaction,  nurse to call physician and pharmacy when reaction occurs   Sodium Chloride 0.9% NS 500ml 58mAdminister if needed for hypovolemic blood pressure drop or as ordered by physician after call to physician with anaphylactic reaction   Change dressing on IV access line weekly and PRN   Complete by: As directed    Diet - low sodium heart healthy   Complete by: As directed    Discharge wound care:   Complete by: As directed    As above and per general surgery and IR.   Flush IV access with Sodium Chloride 0.9% and Heparin 10 units/ml or 100 units/ml   Complete by: As directed    Home infusion instructions - [facility]   Complete by: As directed    Instructions: Flush IV access with Sodium Chloride 0.9% and Heparin 10units/ml or 100units/ml   Change dressing on IV access line: Weekly and PRN   Instructions Cath Flo 2mg: A87mnister for PICC Line occlusion and as ordered by physician for other access device   [facility] pharmacist to adjust dose for: Vancomycin, Aminoglycosides and other anti-infective therapies as requested by physician   Increase activity slowly   Complete by: As directed    Method of administration may be changed at the discretion of home infusion pharmacist based upon assessment of the patient and/or caregiver's ability to self-administer the medication ordered   Complete by: As directed      Allergies as of 08/21/2020      Reactions   Carbamazepine Other (See Comments)   Parkinsons like symptoms tremors   Lyrica [pregabalin] Other (See Comments)   Sedation Other reaction(s): sedated   Sertraline Hcl Other (See Comments)   Unknown reaction      Medication List    TAKE these medications   ALPRAZolam 1 MG tablet Commonly known as: XANAX Take 1 tablet (1 mg total) by mouth 2 (two) times daily.   aspirin EC 81 MG tablet Take 1 tablet (81 mg total) by mouth daily.   b complex vitamins tablet Take 2 tablets by mouth at bedtime.    clopidogrel 75 MG tablet Commonly known as: PLAVIX Take 75 mg by mouth daily.   docusate sodium 100 MG capsule Commonly known as: COLACE Take 100 mg by mouth at bedtime.   donepezil 10 MG tablet Commonly known as: ARICEPT Take 10 mg by mouth daily.   enoxaparin 40 MG/0.4ML injection Commonly known as: LOVENOX Inject 0.4 mLs (40 mg total) into the skin daily. Start taking on: August 22, 2020   escitalopram 20 MG tablet Commonly known as: LEXAPRO Take 30 mg by mouth daily.   ferrous sulfate 325 (65 FE) MG tablet Take 1 tablet (325 mg total) by mouth daily with breakfast. Start taking on: August 22, 2020   furosemide 20 MG tablet Commonly known as: LASIX Take 1 tablet (20 mg total) by mouth daily.   HYDROcodone-acetaminophen 10-325 MG tablet Commonly known as: NORCO Take 0.5-1 tablets by mouth every 6 (six) hours as needed for moderate pain or severe pain.   lamoTRIgine 150 MG tablet Commonly known as: LAMICTAL Take 150 mg by mouth 2 (two) times  daily.   multivitamins with iron Tabs tablet Take 1 tablet by mouth daily. Start taking on: August 22, 2020   nadolol 20 MG tablet Commonly known as: Corgard Take 0.5 tablets (10 mg total) by mouth daily.   nitroGLYCERIN 0.4 MG SL tablet Commonly known as: NITROSTAT Place 1 tablet (0.4 mg total) under the tongue every 5 (five) minutes as needed for chest pain.   omeprazole 20 MG capsule Commonly known as: PRILOSEC Take 1 capsule (20 mg total) by mouth 2 (two) times daily before a meal.   ondansetron 8 MG tablet Commonly known as: ZOFRAN Take 8 mg by mouth 2 (two) times daily as needed for nausea or vomiting.   phenazopyridine 100 MG tablet Commonly known as: PYRIDIUM Take 1 tablet (100 mg total) by mouth 3 (three) times daily as needed (bladder spasm).   piperacillin-tazobactam  IVPB Commonly known as: ZOSYN Inject 12.5 g into the vein daily. As continuous infusion Indication:  Abdominal abscess First  Dose: Yes Last Day of Therapy:  09/16/20 Labs - Once weekly:  CBC/D and BMP, Labs - Every other week:  ESR and CRP Method of administration: Elastomeric (Continuous infusion) Method of administration may be changed at the discretion of home infusion pharmacist based upon assessment of the patient and/or caregiver's ability to self-administer the medication ordered.   polyethylene glycol 17 g packet Commonly known as: MIRALAX / GLYCOLAX Take 17 g by mouth daily. Start taking on: August 22, 2020   predniSONE 10 MG tablet Commonly known as: DELTASONE Take 5-10 mg by mouth See admin instructions. Take 5 mg by mouth every other day, alternating with 10 mg on opposing days   rizatriptan 10 MG disintegrating tablet Commonly known as: MAXALT-MLT Take 10 mg by mouth See admin instructions. Take one tablet (10 mg) by mouth daily as needed for migraine. May repeat in 2 hours if still needed   rosuvastatin 5 MG tablet Commonly known as: CRESTOR Take 1 tablet (5 mg total) by mouth daily.   saccharomyces boulardii 250 MG capsule Commonly known as: FLORASTOR Take 1 capsule (250 mg total) by mouth 2 (two) times daily.   traZODone 100 MG tablet Commonly known as: DESYREL Take 2 tablets (200 mg total) by mouth at bedtime.   vitamin C 1000 MG tablet Take 1,000 mg by mouth at bedtime.   Vitamin D-3 25 MCG (1000 UT) Caps Take 1,000 Units by mouth daily.            Discharge Care Instructions  (From admission, onward)         Start     Ordered   08/21/20 0000  Change dressing on IV access line weekly and PRN  (Home infusion instructions - [facility] )        08/21/20 1534   08/21/20 0000  Discharge wound care:       Comments: As above and per general surgery and IR.   08/21/20 1534         Allergies  Allergen Reactions  . Carbamazepine Other (See Comments)    Parkinsons like symptoms tremors  . Lyrica [Pregabalin] Other (See Comments)    Sedation Other  reaction(s): sedated  . Sertraline Hcl Other (See Comments)    Unknown reaction    Contact information for follow-up providers    Ophelia Shoulder, MD Follow up.   Specialty: Internal Medicine Why: repeat calcium in one week Contact information: [address] [place] [address] [phone]  [name], MD .   Specialties: Cardiology, Radiology Contact information: [address] [place] [address] [phone]        Philemon Kingdom, MD. Schedule an appointment as soon as possible for a visit in 2 week(s).   Specialty: Internal Medicine Contact information: [address] [place] [address] [phone]        Janith Lima, MD. Schedule an appointment as soon as possible for a visit in 2 week(s).   Specialty: Urology Contact information: [place] [address] [phone]        Michael Boston, MD Follow up on 09/03/2020.   Specialty: General Surgery Why: Your appointment is at 11:45 AM.Be at the office 30 minutes early for check in. Bring photo ID, and insurance information with you.  Have someone from the Rehab or a family member come with you.  Contact information: [place] [address] [phone]        [facility] Follow up.   Why: Please follow up in our outpatient clinic in 10-14 days. A scheduler from our office will call you to arrange this appointment. Flush the drain daily and record the output. Keep the site clean and dry. Please call our office with any questions.  Contact information: [place] [address] [phone]        Thayer Headings, MD Follow up on 09/11/2020.   Specialty: Infectious Diseases Why: Follow-up as scheduled at 11:30 AM Contact information: [address] [place] [address] [phone]            Contact information for after-discharge care     Destination    [facility] .   Service: Skilled Nursing Contact information: [place] [phone]                   The results of significant diagnostics from this hospitalization (including imaging, microbiology, ancillary and laboratory) are listed below for reference.    Significant Diagnostic Studies: CT Head Wo Contrast  Result Date: 08/09/2020 CLINICAL DATA:  Increased weakness, recent abdominal surgery EXAM: CT HEAD WITHOUT CONTRAST TECHNIQUE: Contiguous axial images were obtained from the base of the skull through the vertex without intravenous contrast. COMPARISON:  07/19/2020 FINDINGS: Brain: There is no acute intracranial hemorrhage, mass effect, or edema. Gray-white differentiation is preserved. There is no extra-axial fluid collection. Ventricles and sulci are within normal limits in size and configuration. Vascular: No hyperdense vessel or unexpected calcification. Skull: Calvarium is unremarkable. Sinuses/Orbits: No acute finding. Other: None. IMPRESSION: No acute intracranial abnormality. Electronically Signed   By: Macy Mis M.D.   On: 08/09/2020 15:05   CT ABDOMEN PELVIS W CONTRAST  Result Date: 08/15/2020 CLINICAL DATA:  Vesicointestinal fistula, persistent colovesical fistula, CT cystogram EXAM: CT ABDOMEN AND PELVIS WITH CONTRAST TECHNIQUE: Multidetector CT imaging of the abdomen and pelvis was performed using the standard protocol following bolus administration of intravenous contrast. CONTRAST:  159m OMNIPAQUE IOHEXOL 300 MG/ML  SOLN COMPARISON:  06/04/2020 FINDINGS: Lower chest: Dependent atelectasis at lung bases Hepatobiliary: Gallbladder surgically absent.  Liver unremarkable. Pancreas: Normal appearance Spleen: Normal appearance Adrenals/Urinary Tract: Adrenal glands normal appearance. BILATERAL perinephric stranding, nonspecific. BILATERAL renal cysts. No additional renal mass,  hydronephrosis or hydroureter. No urinary tract calcifications. Foley catheter within urinary bladder. Bladder distended by contrast material. Contrast also opacifies the sigmoid colon and rectum,  with contrast seen extending proximally into the descending colon. Contrast is also seen extending into a gas and fluid containing collection anterosuperior to the urinary bladder 8.0 x 4.0 x 4.4 cm image 66 consistent with perforation/abscess. Surrounding infiltrative changes. Stomach/Bowel: Diverticulosis of sigmoid colon. Prior takedown of at splenic flexure of colon. Wall thickening of sigmoid colon with adjacent infiltrative changes question diverticulitis. Sigmoid anastomosis staple line noted. Appendix surgically absent by history. Stomach and small bowel loops normal appearance. Proximal colon normal appearance. Mild rectal wall thickening. Vascular/Lymphatic: Atherosclerotic calcification aorta, minimal. Aorta normal caliber. No adenopathy. Few normal sized pelvic lymph nodes seen. Reproductive: Uterus surgically absent. Questionable visualization of normal sized ovaries. Other: Foci of free air in the abdomen which could be related to recent procedure or perforation. Stranding in presacral fat. No definite ascites. No hernia. Subcutaneous fluid collection and anterior pelvic wall, 8.7 x 2.6 cm extending 5.5 cm length, could be sterile or infected. Musculoskeletal: Osseous demineralization. Chronic superior endplate compression fractures of T9 and T11. No acute osseous findings. IMPRESSION: 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 8.0 x 4.0 x 4.4 cm diameter abscess collection adjacent to the bladder and sigmoid colon, also opacifying with contrast from bladder fistula. Free air within the abdomen consistent with perforation. Sigmoid diverticulosis with wall  thickening and adjacent infiltrative changes though these could be related to prior surgery rather than diverticulitis. Subcutaneous fluid collection and gas collection anterior pelvic wall 8.7 x 2.6 x 5.5 cm, could be sterile or infected. Aortic Atherosclerosis (ICD10-I70.0). Critical Value/emergent results were called by telephone at the time of interpretation on 08/15/2020 at 1640 hours to provider DR. MATTHEW GAY, who verbally acknowledged these results. Electronically Signed   By: Lavonia Dana M.D.   On: 08/15/2020 16:41   DG Cystogram  Result Date: 07/23/2020 CLINICAL DATA:  History of repair of colovesical fistula. EXAM: CYSTOGRAM TECHNIQUE: 150 mL Cysto-Hypaque 30% was infused into the bladder through the existing Foley catheter by drip infusion. Serial spot images were obtained during bladder filling and post draining. FLUOROSCOPY TIME:  Fluoroscopy Time:  1 minutes and 40 seconds Radiation Exposure Index (if provided by the fluoroscopic device): 48.8 mGy Number of Acquired Spot Images: 3 COMPARISON:  CT scan 06/04/2020 FINDINGS: The bladder is unremarkable. No filling defects or extravasation of contrast is identified. IMPRESSION: Normal cystogram. No extravasation of contrast to suggest persistent fistula or postoperative leakage. Electronically Signed   By: Marijo Sanes M.D.   On: 07/23/2020 10:16   IR Fluoro Guide CV Line Right  Result Date: 08/19/2020 INDICATION: Poor venous access. In need of durable intravenous access for medication administration and blood draws. Patient with history of previous failed attempted bedside PICC line placement. EXAM: 1. ULTRASOUND AND FLUOROSCOPIC GUIDED PICC LINE INSERTION 2. ULTRASOUND AND FLUOROSCOPIC GUIDED PLACEMENT OF A TUNNELED CENTRAL VENOUS CATHETER MEDICATIONS: None. CONTRAST:  10 cc Omnipaque 300 FLUOROSCOPY TIME:  1 minute, 24 seconds (932 mGy) COMPLICATIONS: None immediate. TECHNIQUE: The procedure, risks, benefits, and alternatives were explained  to the patient and informed written consent was obtained. A timeout was performed prior to the initiation of the procedure. The right upper extremity was prepped with chlorhexidine in a sterile fashion, and a sterile drape was applied covering the operative field. Maximum barrier sterile technique with sterile gowns and gloves were used for the procedure. A timeout was performed prior to the initiation  of the procedure. Local anesthesia was provided with 1% lidocaine. Under direct ultrasound guidance, the brachial vein was accessed with a micropuncture kit after the overlying soft tissues were anesthetized with 1% lidocaine. After the overlying soft tissues were anesthetized, a small venotomy incision was created and a micropuncture kit was utilized to access the right basilic vein. Real-time ultrasound guidance was utilized for vascular access including the acquisition of a permanent ultrasound image documenting patency of the accessed vessel. As was difficulty advancing guidewire to the level of the superior cavoatrial junction. Limited contrast injection was performed via the peel-away sheath demonstrating occlusion of the right subclavian vein with development of hypertrophied supraclavicular venous collaterals. As such, decision was made to place a midline catheter. A guidewire was advanced to the level of the right axilla for measurement purposes and the PICC line was cut to length. A peel-away sheath was placed and a 21 cm, 5 [place], dual lumen was inserted to level of the right axillary vein. A post procedure spot fluoroscopic was obtained. The catheter easily aspirated and flushed and was secured in place with stat lock device. A dressing was applied. _________________________________________________________ As patient is in need of durable intravenous access and has difficulty maintaining patency of previous midline catheters, the decision was made to proceed with placement of a definitive tunneled central  venous catheter, The skin overlying the base of the right neck and right upper chest were prepped and draped in usual sterile fashion. Local anesthesia was provided with 1% lidocaine. Under direct ultrasound guidance, the right internal jugular vein was accessed with a micropuncture kit after the overlying soft tissues were anesthetized with 1% lidocaine. After the overlying soft tissues were anesthetized, a small venotomy incision was created and a micropuncture kit was utilized to access the right internal jugular vein. Real-time ultrasound guidance was utilized for vascular access including the acquisition of a permanent ultrasound image documenting patency of the accessed vessel. The tunneled single-lumen central venous catheter was then tunneled from the right upper chest to the venotomy site. The guidewire was utilized for measurement purposes and the catheter was inserted via the peel-away sheath with tip ultimately terminating within the superior cavoatrial junction. A tunneled central venous catheter was noted to easily aspirate and flush was secured in place within interrupted suture. Dressings were applied. The patient tolerated the above procedures well without immediate postprocedural complication FINDINGS: After catheter placement, the right upper extremity approach midline dual lumen PICC line tip lies within the right axillary vein. After catheter placement, the right internal jugular approach tunneled single-lumen central venous catheter tip lies within the superior cavoatrial junction. Both catheters easily aspirate and flush and both are ready for immediate use. IMPRESSION: 1. Successful ultrasound and fluoroscopic guided placement of a tunneled right internal jugular approach single-lumen central venous catheter with tip terminating within the superior cavoatrial junction. 2. Successful ultrasound and fluoroscopic guided placement of a right upper extremity approach midline PICC with tip  terminating within the right axillary vein. 3. 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 Electronically Signed   By: Sandi Mariscal M.D.   On: 08/19/2020 10:55   IR US Guide Vasc Access Right  Result Date: 08/19/2020 INDICATION: Poor venous access. In need of durable intravenous access for medication administration and blood draws. Patient with history of previous failed attempted bedside PICC line placement. EXAM: 1. ULTRASOUND AND FLUOROSCOPIC GUIDED PICC LINE INSERTION 2. ULTRASOUND AND FLUOROSCOPIC GUIDED  PLACEMENT OF A TUNNELED CENTRAL VENOUS CATHETER MEDICATIONS: None. CONTRAST:  10 cc Omnipaque 300 FLUOROSCOPY TIME:  1 minute, 24 seconds (161 mGy) COMPLICATIONS: None immediate. TECHNIQUE: The procedure, risks, benefits, and alternatives were explained to the patient and informed written consent was obtained. A timeout was performed prior to the initiation of the procedure. The right upper extremity was prepped with chlorhexidine in a sterile fashion, and a sterile drape was applied covering the operative field. Maximum barrier sterile technique with sterile gowns and gloves were used for the procedure. A timeout was performed prior to the initiation of the procedure. Local anesthesia was provided with 1% lidocaine. Under direct ultrasound guidance, the brachial vein was accessed with a micropuncture kit after the overlying soft tissues were anesthetized with 1% lidocaine. After the overlying soft tissues were anesthetized, a small venotomy incision was created and a micropuncture kit was utilized to access the right basilic vein. Real-time ultrasound guidance was utilized for vascular access including the acquisition of a permanent ultrasound image documenting patency of the accessed vessel. As was difficulty advancing guidewire to the level of the superior cavoatrial junction. Limited  contrast injection was performed via the peel-away sheath demonstrating occlusion of the right subclavian vein with development of hypertrophied supraclavicular venous collaterals. As such, decision was made to place a midline catheter. A guidewire was advanced to the level of the right axilla for measurement purposes and the PICC line was cut to length. A peel-away sheath was placed and a 21 cm, 5 [place], dual lumen was inserted to level of the right axillary vein. A post procedure spot fluoroscopic was obtained. The catheter easily aspirated and flushed and was secured in place with stat lock device. A dressing was applied. _________________________________________________________ As patient is in need of durable intravenous access and has difficulty maintaining patency of previous midline catheters, the decision was made to proceed with placement of a definitive tunneled central venous catheter, The skin overlying the base of the right neck and right upper chest were prepped and draped in usual sterile fashion. Local anesthesia was provided with 1% lidocaine. Under direct ultrasound guidance, the right internal jugular vein was accessed with a micropuncture kit after the overlying soft tissues were anesthetized with 1% lidocaine. After the overlying soft tissues were anesthetized, a small venotomy incision was created and a micropuncture kit was utilized to access the right internal jugular vein. Real-time ultrasound guidance was utilized for vascular access including the acquisition of a permanent ultrasound image documenting patency of the accessed vessel. The tunneled single-lumen central venous catheter was then tunneled from the right upper chest to the venotomy site. The guidewire was utilized for measurement purposes and the catheter was inserted via the peel-away sheath with tip ultimately terminating within the superior cavoatrial junction. A tunneled central venous catheter was noted to easily aspirate  and flush was secured in place within interrupted suture. Dressings were applied. The patient tolerated the above procedures well without immediate postprocedural complication FINDINGS: After catheter placement, the right upper extremity approach midline dual lumen PICC line tip lies within the right axillary vein. After catheter placement, the right internal jugular approach tunneled single-lumen central venous catheter tip lies within the superior cavoatrial junction. Both catheters easily aspirate and flush and both are ready for immediate use. IMPRESSION: 1. Successful ultrasound and fluoroscopic guided placement of a tunneled right internal jugular approach single-lumen central venous catheter with tip terminating within the superior cavoatrial junction. 2. Successful ultrasound and fluoroscopic guided placement of a right  upper extremity approach midline PICC with tip terminating within the right axillary vein. 3. 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 Electronically Signed   By: Sandi Mariscal M.D.   On: 08/19/2020 10:55   DG Chest Portable 1 View  Result Date: 08/09/2020 CLINICAL DATA:  Weakness EXAM: PORTABLE CHEST 1 VIEW COMPARISON:  None. FINDINGS: Stable cardiomediastinal contours with top-normal heart size. No new consolidation or edema. Mild chronic interstitial prominence. No pleural effusion or pneumothorax. IMPRESSION: No acute process in the chest. Electronically Signed   By: Macy Mis M.D.   On: 08/09/2020 14:51   CT IMAGE GUIDED DRAINAGE BY PERCUTANEOUS CATHETER  Result Date: 08/17/2020 CLINICAL DATA:  Colovesical fistula with abscess, drainage requested EXAM: CT GUIDED DRAINAGE OF PELVIC ABSCESS ANESTHESIA/SEDATION: Intravenous Fentanyl 161mg and Versed 240mwere administered as conscious sedation during continuous monitoring of the patient's level of  consciousness and physiological / cardiorespiratory status by the radiology RN, with a total moderate sedation time of 15 minutes. PROCEDURE: The procedure, risks, benefits, and alternatives were explained to the patient. Questions regarding the procedure were encouraged and answered. The patient understands and consents to the procedure. Select axial scans through the pelvis was obtained. The collection was localized and an appropriate skin entry site was determined and marked. The site was prepped with chlorhexidinein a sterile fashion, and a sterile drape was applied covering the operative field. A sterile gown and sterile gloves were used for the procedure. Local anesthesia was provided with 1% Lidocaine. Under CT fluoroscopic guidance, 18 gauge trocar needle advanced into the collection. An Amplatz wire advanced easily. Tract dilated to facilitate placement 12 French pigtail drain catheter. 5 mL of thick feculent material were aspirated, sent for Gram stain and culture. The catheter was secured externally with 0 Prolene suture and StatLock and placed to gravity drain bag. The patient tolerated the procedure well. COMPLICATIONS: None immediate FINDINGS: Gas and fluid collection interposed between the sigmoid colon and urinary bladder was again localized. 12 French pigtail drain catheter placed as above. IMPRESSION: Technically successful CT-guided pelvic abscess drain catheter placement. Electronically Signed   By: D Lucrezia Europe.D.   On: 08/17/2020 08:19   IR PICC PLACEMENT RIGHT >5 YRS INC IMG GUIDE  Result Date: 08/19/2020 INDICATION: Poor venous access. In need of durable intravenous access for medication administration and blood draws. Patient with history of previous failed attempted bedside PICC line placement. EXAM: 1. ULTRASOUND AND FLUOROSCOPIC GUIDED PICC LINE INSERTION 2. ULTRASOUND AND FLUOROSCOPIC GUIDED PLACEMENT OF A TUNNELED CENTRAL VENOUS CATHETER MEDICATIONS: None. CONTRAST:  10 cc  Omnipaque 300 FLUOROSCOPY TIME:  1 minute, 24 seconds (11948Gy) COMPLICATIONS: None immediate. TECHNIQUE: The procedure, risks, benefits, and alternatives were explained to the patient and informed written consent was obtained. A timeout was performed prior to the initiation of the procedure. The right upper extremity was prepped with chlorhexidine in a sterile fashion, and a sterile drape was applied covering the operative field. Maximum barrier sterile technique with sterile gowns and gloves were used for the procedure. A timeout was performed prior to the initiation of the procedure. Local anesthesia was provided with 1% lidocaine. Under direct ultrasound guidance, the brachial vein was accessed with a micropuncture kit after the overlying soft tissues were anesthetized with 1% lidocaine. After the overlying soft tissues were anesthetized, a small venotomy incision was created and a micropuncture kit was utilized to access the right basilic vein. Real-time ultrasound  guidance was utilized for vascular access including the acquisition of a permanent ultrasound image documenting patency of the accessed vessel. As was difficulty advancing guidewire to the level of the superior cavoatrial junction. Limited contrast injection was performed via the peel-away sheath demonstrating occlusion of the right subclavian vein with development of hypertrophied supraclavicular venous collaterals. As such, decision was made to place a midline catheter. A guidewire was advanced to the level of the right axilla for measurement purposes and the PICC line was cut to length. A peel-away sheath was placed and a 21 cm, 5 [place], dual lumen was inserted to level of the right axillary vein. A post procedure spot fluoroscopic was obtained. The catheter easily aspirated and flushed and was secured in place with stat lock device. A dressing was applied. _________________________________________________________ As patient is in need of durable  intravenous access and has difficulty maintaining patency of previous midline catheters, the decision was made to proceed with placement of a definitive tunneled central venous catheter, The skin overlying the base of the right neck and right upper chest were prepped and draped in usual sterile fashion. Local anesthesia was provided with 1% lidocaine. Under direct ultrasound guidance, the right internal jugular vein was accessed with a micropuncture kit after the overlying soft tissues were anesthetized with 1% lidocaine. After the overlying soft tissues were anesthetized, a small venotomy incision was created and a micropuncture kit was utilized to access the right internal jugular vein. Real-time ultrasound guidance was utilized for vascular access including the acquisition of a permanent ultrasound image documenting patency of the accessed vessel. The tunneled single-lumen central venous catheter was then tunneled from the right upper chest to the venotomy site. The guidewire was utilized for measurement purposes and the catheter was inserted via the peel-away sheath with tip ultimately terminating within the superior cavoatrial junction. A tunneled central venous catheter was noted to easily aspirate and flush was secured in place within interrupted suture. Dressings were applied. The patient tolerated the above procedures well without immediate postprocedural complication FINDINGS: After catheter placement, the right upper extremity approach midline dual lumen PICC line tip lies within the right axillary vein. After catheter placement, the right internal jugular approach tunneled single-lumen central venous catheter tip lies within the superior cavoatrial junction. Both catheters easily aspirate and flush and both are ready for immediate use. IMPRESSION: 1. Successful ultrasound and fluoroscopic guided placement of a tunneled right internal jugular approach single-lumen central venous catheter with tip  terminating within the superior cavoatrial junction. 2. Successful ultrasound and fluoroscopic guided placement of a right upper extremity approach midline PICC with tip terminating within the right axillary vein. 3. 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 Electronically Signed   By: Sandi Mariscal M.D.   On: 08/19/2020 10:55   [place] EKG SITE RITE  Result Date: 08/16/2020 If Site Rite image not attached, placement could not be confirmed due to current cardiac rhythm.   Microbiology: Recent Results (from the past 240 hour(s))  SARS Coronavirus 2 by RT PCR (hospital order, performed in [facility] hospital lab) Nasopharyngeal Nasopharyngeal Swab     Status: None   Collection Time: 08/14/20 11:59 PM   Specimen: Nasopharyngeal Swab  Result Value Ref Range Status   SARS Coronavirus 2 NEGATIVE NEGATIVE Final    Comment: (NOTE) SARS-CoV-2 target nucleic acids are NOT DETECTED.  The SARS-CoV-2 RNA is generally detectable in upper and lower respiratory specimens during the  acute phase of infection. The lowest concentration of SARS-CoV-2 viral copies this assay can detect is 250 copies / mL. A negative result does not preclude SARS-CoV-2 infection and should not be used as the sole basis for treatment or other patient management decisions.  A negative result may occur with improper specimen collection / handling, submission of specimen other than nasopharyngeal swab, presence of viral mutation(s) within the areas targeted by this assay, and inadequate number of viral copies (<250 copies / mL). A negative result must be combined with clinical observations, patient history, and epidemiological information.  Fact Sheet for Patients:   StrictlyIdeas.no  Fact Sheet for Healthcare Providers: BankingDealers.co.za  This test is not yet  approved or  cleared by the [place] FDA and has been authorized for detection and/or diagnosis of SARS-CoV-2 by FDA under an Emergency Use Authorization (EUA).  This EUA will remain in effect (meaning this test can be used) for the duration of the COVID-19 declaration under Section 564(b)(1) of the Act, 21 U.S.C. section 360bbb-3(b)(1), unless the authorization is terminated or revoked sooner.  Performed at [facility], [place] [address], [place], [place] [address]   Aerobic/Anaerobic Culture (surgical/deep wound)     Status: None   Collection Time: 08/16/20  8:42 PM   Specimen: Abscess  Result Value Ref Range Status   Specimen Description   Final    ABSCESS Performed at [place] [address] [place], [place] [address]    Special Requests   Final    Normal Performed at [facility], [place] [address] [place], [place] [address]    Gram Stain   Final    ABUNDANT WBC PRESENT, PREDOMINANTLY PMN ABUNDANT GRAM POSITIVE COCCI MODERATE GRAM NEGATIVE RODS MODERATE GRAM POSITIVE RODS    Culture   Final    MODERATE ESCHERICHIA COLI ABUNDANT ENTEROCOCCUS AVIUM MODERATE BACTEROIDES STERCORIS BETA LACTAMASE POSITIVE Performed at [facility], [place] [address], [place], [place] [address]    Report Status 08/20/2020 FINAL  Final   Organism ID, Bacteria ESCHERICHIA COLI  Final   Organism ID, Bacteria ENTEROCOCCUS AVIUM  Final      Susceptibility   Enterococcus avium - MIC*    AMPICILLIN <=2 SENSITIVE Sensitive     VANCOMYCIN <=0.5 SENSITIVE Sensitive     GENTAMICIN SYNERGY SENSITIVE Sensitive     * ABUNDANT ENTEROCOCCUS AVIUM   Escherichia coli - MIC*    AMPICILLIN >=32 RESISTANT Resistant     CEFAZOLIN <=4 SENSITIVE Sensitive     CEFEPIME <=0.12 SENSITIVE Sensitive     CEFTAZIDIME <=1 SENSITIVE Sensitive     CEFTRIAXONE <=0.25 SENSITIVE Sensitive     CIPROFLOXACIN 0.5 SENSITIVE Sensitive     GENTAMICIN <=1  SENSITIVE Sensitive     IMIPENEM <=0.25 SENSITIVE Sensitive     TRIMETH/SULFA <=20 SENSITIVE Sensitive     AMPICILLIN/SULBACTAM >=32 RESISTANT Resistant     PIP/TAZO <=4 SENSITIVE Sensitive     * MODERATE ESCHERICHIA COLI  SARS CORONAVIRUS 2 (TAT 6-24 HRS) Nasopharyngeal Nasopharyngeal Swab     Status: None   Collection Time: 08/20/20 10:14 AM   Specimen: Nasopharyngeal Swab  Result Value Ref Range Status   SARS Coronavirus 2 NEGATIVE NEGATIVE Final    Comment: (NOTE) SARS-CoV-2 target nucleic acids are NOT DETECTED.  The SARS-CoV-2 RNA is generally detectable in upper and lower respiratory specimens during the acute phase of infection. Negative results do not preclude SARS-CoV-2 infection, do not rule out co-infections with other  pathogens, and should not be used as the sole basis for treatment or other patient management decisions. Negative results must be combined with clinical observations, patient history, and epidemiological information. The expected result is Negative.  Fact Sheet for Patients: SugarRoll.be  Fact Sheet for Healthcare Providers: https://www.woods-mathews.com/  This test is not yet approved or cleared by the [place] FDA and  has been authorized for detection and/or diagnosis of SARS-CoV-2 by FDA under an Emergency Use Authorization (EUA). This EUA will remain  in effect (meaning this test can be used) for the duration of the COVID-19 declaration under Se ction 564(b)(1) of the Act, 21 U.S.C. section 360bbb-3(b)(1), unless the authorization is terminated or revoked sooner.  Performed at [facility], [place] [address], [place], [place] [address]      Labs: Basic Metabolic Panel: Recent Labs  Lab 08/15/20 0535 08/16/20 0608 08/18/20 0433 08/19/20 0408  NA 140 142 146* 143  K 3.3* 3.6 3.4* 4.0  CL 108 111 113* 112*  CO2 21* _0 GLUCOSE 93 90 84 92  BUN _1 CREATININE 1.32* 1.41*  1.48* 1.36*  CALCIUM 8.3* 8.2* 7.9* 7.9*  MG  --  2.3  --   --   PHOS 1.9* 2.2* 3.0  --    Liver Function Tests: Recent Labs  Lab 08/19/20 0408  AST 13*  ALT 13  ALKPHOS 49  BILITOT 0.4  PROT 4.9*  ALBUMIN 2.6*   No results for input(s): LIPASE, AMYLASE in the last 168 hours. No results for input(s): AMMONIA in the last 168 hours. CBC: Recent Labs  Lab 08/15/20 0535 08/16/20 0608 08/19/20 0408  WBC 9.5 6.9 7.8  NEUTROABS 6.9  --  5.9  HGB 8.3* 8.6* 8.1*  HCT 26.7* 27.9* 27.8*  MCV 100.8* 100.7* 104.9*  PLT 183 194 215   Cardiac Enzymes: No results for input(s): CKTOTAL, CKMB, CKMBINDEX, TROPONINI in the last 168 hours. BNP: BNP (last 3 results) No results for input(s): BNP in the last 8760 hours.  ProBNP (last 3 results) No results for input(s): PROBNP in the last 8760 hours.  CBG: No results for input(s): GLUCAP in the last 168 hours.     Signed:  Irine Seal MD.  [facility] 08/21/2020, 4:14 PM

## 2020-08-21 NOTE — Progress Notes (Signed)
Occupational Therapy Treatment Patient Details Name: Valerie [name] MRN: [id number] DOB: August 18, 1961 Today's Date: 08/21/2020    History of present illness 59 y.o. 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 Dx of hypercalcemia, AKI. Patient s/p PICC line and IR drain   OT comments  Patient demonstrating improved strength and activity tolerance. Patient demonstrated ability to ambulate to bathroom, perform toileting, stand at the sink to wash hands and ambulate in the hall x 100 feet with RW. Patient needed 2 very brief stops in the hall "to get my legs underneath me" but no obvious buckling of knees. Continue to recommend short term rehab to maximize strength, endurance and functional independence in order for patient to eventually [facility].    Follow Up Recommendations  SNF    Equipment Recommendations  None recommended by OT    Recommendations for Other Services      Precautions / Restrictions Precautions Precautions: Fall Restrictions Weight Bearing Restrictions: No       Mobility Bed Mobility   Bed Mobility: Sit to Supine     Supine to sit: Supervision     General bed mobility comments: Patient up in reclner. Supervision for return back to bed.  Transfers Overall transfer level: Needs assistance Equipment used: Rolling walker (2 wheeled)   Sit to Stand: Min guard Stand pivot transfers: Min guard       General transfer comment: Min guard to ambulate to bathroom with RW and in the hall approx 100 feet. No reports of weak knees or buckling today.    Balance Overall balance assessment:  Mild deficits observed, not formally tested                                         ADL either performed or assessed with clinical judgement   ADL       Grooming: Standing;Wash/dry hands Grooming Details (indicate cue type and reason): Patient stood at sink to wash hands.                 Toilet Transfer: Min Radiographer, therapeutic Details (indicate cue type and reason): Patient ambulated to bathroom, performed toilet transfer with use of grab bar. Toileting- Clothing Manipulation and Hygiene: Supervision/safety;Sit to/from stand;Set up Toileting - Clothing Manipulation Details (indicate cue type and reason): Supervision and set up for toileting. Patient requested warm wash cloth for pericare.             Vision Patient Visual Report: No change from baseline     Perception     Praxis      Cognition Arousal/Alertness: Awake/alert Behavior During Therapy: WFL for tasks assessed/performed Overall Cognitive Status: Within Functional Limits for tasks assessed                                          Exercises     Shoulder Instructions       General Comments      Pertinent Vitals/ Pain  Pain Assessment: No/denies pain  Home Living                                          Prior Functioning/Environment              Frequency  Min 2X/week        Progress Toward Goals  OT Goals(current goals can now be found in the care plan section)  Progress towards OT goals: Progressing toward goals  Acute Rehab OT Goals Patient Stated Goal: To eventually return home OT Goal Formulation: With patient/family Time For Goal Achievement: 08/24/20 Potential to Achieve Goals: Good  Plan Discharge plan remains appropriate    Co-evaluation          OT goals addressed during session: ADL's and self-care      AM-PAC OT "6 Clicks" Daily Activity     Outcome Measure   Help  from another person eating meals?: None Help from another person taking care of personal grooming?: A Little Help from another person toileting, which includes using toliet, bedpan, or urinal?: A Little Help from another person bathing (including washing, rinsing, drying)?: A Little Help from another person to put on and taking off regular upper body clothing?: A Little Help from another person to put on and taking off regular lower body clothing?: A Lot 6 Click Score: 18    End of Session Equipment Utilized During Treatment: Gait belt;Rolling walker  OT Visit Diagnosis: Unsteadiness on feet (R26.81);Other abnormalities of gait and mobility (R26.89);Muscle weakness (generalized) (M62.81);Pain   Activity Tolerance Patient tolerated treatment well   Patient Left with call bell/phone within reach   Nurse Communication  (okay to see per Rn)        TimeTO:[id number] OT Time Calculation (min): 17 min  Charges: OT General Charges $OT Visit: 1 Visit OT Treatments $Therapeutic Activity: 8-22 mins  Derl Barrow, OTR/L [place]  Office [phone] Pager: [place] 08/21/2020, 11:01 AM

## 2020-08-21 NOTE — Progress Notes (Signed)
Subjective: Pain controlled. No nausea or emesis. Tolerating diet. Tolerating foley - she actually prefers this.  Objective: Vital signs in last 24 hours: Temp:  [98.1 F (36.7 C)-98.8 F (37.1 C)] 98.1 F (36.7 C) (12/29 0557) Pulse Rate:  [47-48] 48 (12/29 0557) Resp:  [17-18] 17 (12/29 0557) BP: (116-154)/(63-65) 116/64 (12/29 0557) SpO2:  [97 %-98 %] 97 % (12/29 0557)  Intake/Output from previous day: 12/28 0701 - 12/29 0700 In: 1385.8 [P.O.:1200; I.V.:5; IV Piggyback:165.8] Out: 2465 [Urine:2425; Drains:40] Intake/Output this shift: No intake/output data recorded.   UOP: 2.5L clear yellow Drain: 57m seropurulent  Physical Exam:  General: Alert and oriented CV: RRR Lungs: Clear Abdomen: Soft, ND, NT; drain in place Ext: NT, No erythema  Lab Results: Recent Labs    08/19/20 0408  HGB 8.1*  HCT 27.8*   BMET Recent Labs    08/19/20 0408  NA 143  K 4.0  CL 112*  CO2 25  GLUCOSE 92  BUN 11  CREATININE 1.36*  CALCIUM 7.9*     Studies/Results: IR Fluoro Guide CV Line Right  Result Date: 08/19/2020 INDICATION: Poor venous access. In need of durable intravenous access for medication administration and blood draws. Patient with history of previous failed attempted bedside PICC line placement. EXAM: 1. ULTRASOUND AND FLUOROSCOPIC GUIDED PICC LINE INSERTION 2. ULTRASOUND AND FLUOROSCOPIC GUIDED PLACEMENT OF A TUNNELED CENTRAL VENOUS CATHETER MEDICATIONS: None. CONTRAST:  10 cc Omnipaque 300 FLUOROSCOPY TIME:  1 minute, 24 seconds (1854mGy) COMPLICATIONS: None immediate. TECHNIQUE: The procedure, risks, benefits, and alternatives were explained to the patient and informed written consent was obtained. A timeout was performed prior to the initiation of the procedure. The right upper extremity was prepped with chlorhexidine in a sterile fashion, and a sterile drape was applied covering the operative field. Maximum barrier sterile technique with sterile gowns and  gloves were used for the procedure. A timeout was performed prior to the initiation of the procedure. Local anesthesia was provided with 1% lidocaine. Under direct ultrasound guidance, the brachial vein was accessed with a micropuncture kit after the overlying soft tissues were anesthetized with 1% lidocaine. After the overlying soft tissues were anesthetized, a small venotomy incision was created and a micropuncture kit was utilized to access the right basilic vein. Real-time ultrasound guidance was utilized for vascular access including the acquisition of a permanent ultrasound image documenting patency of the accessed vessel. As was difficulty advancing guidewire to the level of the superior cavoatrial junction. Limited contrast injection was performed via the peel-away sheath demonstrating occlusion of the right subclavian vein with development of hypertrophied supraclavicular venous collaterals. As such, decision was made to place a midline catheter. A guidewire was advanced to the level of the right axilla for measurement purposes and the PICC line was cut to length. A peel-away sheath was placed and a 21 cm, 5 [place] dual lumen was inserted to level of the right axillary vein. A post procedure spot fluoroscopic was obtained. The catheter easily aspirated and flushed and was secured in place with stat lock device. A dressing was applied. _________________________________________________________ As patient is in need of durable intravenous access and has difficulty maintaining patency of previous midline catheters, the decision was made to proceed with placement of a definitive tunneled central venous catheter, The skin overlying the base of the right neck and right upper chest were prepped and draped in usual sterile fashion. Local anesthesia was provided with 1% lidocaine. Under direct ultrasound guidance, the right internal jugular vein was  accessed with a micropuncture kit after the overlying soft tissues  were anesthetized with 1% lidocaine. After the overlying soft tissues were anesthetized, a small venotomy incision was created and a micropuncture kit was utilized to access the right internal jugular vein. Real-time ultrasound guidance was utilized for vascular access including the acquisition of a permanent ultrasound image documenting patency of the accessed vessel. The tunneled single-lumen central venous catheter was then tunneled from the right upper chest to the venotomy site. The guidewire was utilized for measurement purposes and the catheter was inserted via the peel-away sheath with tip ultimately terminating within the superior cavoatrial junction. A tunneled central venous catheter was noted to easily aspirate and flush was secured in place within interrupted suture. Dressings were applied. The patient tolerated the above procedures well without immediate postprocedural complication FINDINGS: After catheter placement, the right upper extremity approach midline dual lumen PICC line tip lies within the right axillary vein. After catheter placement, the right internal jugular approach tunneled single-lumen central venous catheter tip lies within the superior cavoatrial junction. Both catheters easily aspirate and flush and both are ready for immediate use. IMPRESSION: 1. Successful ultrasound and fluoroscopic guided placement of a tunneled right internal jugular approach single-lumen central venous catheter with tip terminating within the superior cavoatrial junction. 2. Successful ultrasound and fluoroscopic guided placement of a right upper extremity approach midline PICC with tip terminating within the right axillary vein. 3. 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 Electronically Signed   By: Sandi Mariscal M.D.   On: 08/19/2020 10:55   IR US Guide Vasc Access Right  Result Date:  08/19/2020 INDICATION: Poor venous access. In need of durable intravenous access for medication administration and blood draws. Patient with history of previous failed attempted bedside PICC line placement. EXAM: 1. ULTRASOUND AND FLUOROSCOPIC GUIDED PICC LINE INSERTION 2. ULTRASOUND AND FLUOROSCOPIC GUIDED PLACEMENT OF A TUNNELED CENTRAL VENOUS CATHETER MEDICATIONS: None. CONTRAST:  10 cc Omnipaque 300 FLUOROSCOPY TIME:  1 minute, 24 seconds (962 mGy) COMPLICATIONS: None immediate. TECHNIQUE: The procedure, risks, benefits, and alternatives were explained to the patient and informed written consent was obtained. A timeout was performed prior to the initiation of the procedure. The right upper extremity was prepped with chlorhexidine in a sterile fashion, and a sterile drape was applied covering the operative field. Maximum barrier sterile technique with sterile gowns and gloves were used for the procedure. A timeout was performed prior to the initiation of the procedure. Local anesthesia was provided with 1% lidocaine. Under direct ultrasound guidance, the brachial vein was accessed with a micropuncture kit after the overlying soft tissues were anesthetized with 1% lidocaine. After the overlying soft tissues were anesthetized, a small venotomy incision was created and a micropuncture kit was utilized to access the right basilic vein. Real-time ultrasound guidance was utilized for vascular access including the acquisition of a permanent ultrasound image documenting patency of the accessed vessel. As was difficulty advancing guidewire to the level of the superior cavoatrial junction. Limited contrast injection was performed via the peel-away sheath demonstrating occlusion of the right subclavian vein with development of hypertrophied supraclavicular venous collaterals. As such, decision was made to place a midline catheter. A guidewire was advanced to the level of the right axilla for measurement purposes and the  PICC line was cut to length. A peel-away sheath was placed and a 21 cm, 5 [place], dual lumen was inserted to level of the  right axillary vein. A post procedure spot fluoroscopic was obtained. The catheter easily aspirated and flushed and was secured in place with stat lock device. A dressing was applied. _________________________________________________________ As patient is in need of durable intravenous access and has difficulty maintaining patency of previous midline catheters, the decision was made to proceed with placement of a definitive tunneled central venous catheter, The skin overlying the base of the right neck and right upper chest were prepped and draped in usual sterile fashion. Local anesthesia was provided with 1% lidocaine. Under direct ultrasound guidance, the right internal jugular vein was accessed with a micropuncture kit after the overlying soft tissues were anesthetized with 1% lidocaine. After the overlying soft tissues were anesthetized, a small venotomy incision was created and a micropuncture kit was utilized to access the right internal jugular vein. Real-time ultrasound guidance was utilized for vascular access including the acquisition of a permanent ultrasound image documenting patency of the accessed vessel. The tunneled single-lumen central venous catheter was then tunneled from the right upper chest to the venotomy site. The guidewire was utilized for measurement purposes and the catheter was inserted via the peel-away sheath with tip ultimately terminating within the superior cavoatrial junction. A tunneled central venous catheter was noted to easily aspirate and flush was secured in place within interrupted suture. Dressings were applied. The patient tolerated the above procedures well without immediate postprocedural complication FINDINGS: After catheter placement, the right upper extremity approach midline dual lumen PICC line tip lies within the right axillary vein. After  catheter placement, the right internal jugular approach tunneled single-lumen central venous catheter tip lies within the superior cavoatrial junction. Both catheters easily aspirate and flush and both are ready for immediate use. IMPRESSION: 1. Successful ultrasound and fluoroscopic guided placement of a tunneled right internal jugular approach single-lumen central venous catheter with tip terminating within the superior cavoatrial junction. 2. Successful ultrasound and fluoroscopic guided placement of a right upper extremity approach midline PICC with tip terminating within the right axillary vein. 3. 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 Electronically Signed   By: Sandi Mariscal M.D.   On: 08/19/2020 10:55   IR PICC PLACEMENT RIGHT >5 YRS INC IMG GUIDE  Result Date: 08/19/2020 INDICATION: Poor venous access. In need of durable intravenous access for medication administration and blood draws. Patient with history of previous failed attempted bedside PICC line placement. EXAM: 1. ULTRASOUND AND FLUOROSCOPIC GUIDED PICC LINE INSERTION 2. ULTRASOUND AND FLUOROSCOPIC GUIDED PLACEMENT OF A TUNNELED CENTRAL VENOUS CATHETER MEDICATIONS: None. CONTRAST:  10 cc Omnipaque 300 FLUOROSCOPY TIME:  1 minute, 24 seconds (102 mGy) COMPLICATIONS: None immediate. TECHNIQUE: The procedure, risks, benefits, and alternatives were explained to the patient and informed written consent was obtained. A timeout was performed prior to the initiation of the procedure. The right upper extremity was prepped with chlorhexidine in a sterile fashion, and a sterile drape was applied covering the operative field. Maximum barrier sterile technique with sterile gowns and gloves were used for the procedure. A timeout was performed prior to the initiation of the procedure. Local anesthesia was provided with 1% lidocaine. Under  direct ultrasound guidance, the brachial vein was accessed with a micropuncture kit after the overlying soft tissues were anesthetized with 1% lidocaine. After the overlying soft tissues were anesthetized, a small venotomy incision was created and a micropuncture kit was utilized to access the right basilic vein. Real-time ultrasound guidance was utilized for  vascular access including the acquisition of a permanent ultrasound image documenting patency of the accessed vessel. As was difficulty advancing guidewire to the level of the superior cavoatrial junction. Limited contrast injection was performed via the peel-away sheath demonstrating occlusion of the right subclavian vein with development of hypertrophied supraclavicular venous collaterals. As such, decision was made to place a midline catheter. A guidewire was advanced to the level of the right axilla for measurement purposes and the PICC line was cut to length. A peel-away sheath was placed and a 21 cm, 5 [place], dual lumen was inserted to level of the right axillary vein. A post procedure spot fluoroscopic was obtained. The catheter easily aspirated and flushed and was secured in place with stat lock device. A dressing was applied. _________________________________________________________ As patient is in need of durable intravenous access and has difficulty maintaining patency of previous midline catheters, the decision was made to proceed with placement of a definitive tunneled central venous catheter, The skin overlying the base of the right neck and right upper chest were prepped and draped in usual sterile fashion. Local anesthesia was provided with 1% lidocaine. Under direct ultrasound guidance, the right internal jugular vein was accessed with a micropuncture kit after the overlying soft tissues were anesthetized with 1% lidocaine. After the overlying soft tissues were anesthetized, a small venotomy incision was created and a micropuncture kit was  utilized to access the right internal jugular vein. Real-time ultrasound guidance was utilized for vascular access including the acquisition of a permanent ultrasound image documenting patency of the accessed vessel. The tunneled single-lumen central venous catheter was then tunneled from the right upper chest to the venotomy site. The guidewire was utilized for measurement purposes and the catheter was inserted via the peel-away sheath with tip ultimately terminating within the superior cavoatrial junction. A tunneled central venous catheter was noted to easily aspirate and flush was secured in place within interrupted suture. Dressings were applied. The patient tolerated the above procedures well without immediate postprocedural complication FINDINGS: After catheter placement, the right upper extremity approach midline dual lumen PICC line tip lies within the right axillary vein. After catheter placement, the right internal jugular approach tunneled single-lumen central venous catheter tip lies within the superior cavoatrial junction. Both catheters easily aspirate and flush and both are ready for immediate use. IMPRESSION: 1. Successful ultrasound and fluoroscopic guided placement of a tunneled right internal jugular approach single-lumen central venous catheter with tip terminating within the superior cavoatrial junction. 2. Successful ultrasound and fluoroscopic guided placement of a right upper extremity approach midline PICC with tip terminating within the right axillary vein. 3. 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 Electronically Signed   By: Sandi Mariscal M.D.   On: 08/19/2020 10:55    Assessment/Plan: 1. Recurrent colovesicular fistula: Dx with CT A/P 05/14/2020 with diverticulitis with concern for contained perforation adjacent to the bladder as well as concern for colovesicular  fistula. S/p robotic low anterior rectosigmoid resection with primary colovesical fistula repair, omental pedicle flap of bladder repair with Dr. Michael Boston on 07/17/2020.  CT cystogram 08/15/2020 with persistent colovesicular fistula arising from the dome of the bladder extending into the sigmoid colon along with a 8 x 4 x 4 cm abscess adjacent to the bladder and sigmoid colon.  2. UTI: Urine culture 08/09/2020 with 100,000 E. Coli. 3. PMH of CKD stage IIIb, cirrhotic arthritis, type 2 diabetes, bipolar disorder, hypertension  -Keep  drain and foley in place for 4-6 weeks -Dr. Johney Maine is planning on drain study in 4-6 weeks -Abx per primary -Following peripherally. Please call with questions.    LOS: 11 days   Matt R. Jilian West MD 08/21/2020, 7:34 AM [facility]  Pager: [phone]

## 2020-08-21 NOTE — TOC Transition Note (Addendum)
Transition of Care [facility]) - CM/SW Discharge Note   Patient Details  Name: Valerie [name] MRN: [id number] Date of Birth: Jul 20, 1961  Transition of Care [facility]) CM/SW Contact:  Clearance Coots, LCSW Phone Number: 08/21/2020, 12:26 PM   Clinical Narrative:    [facility] ready to accept the patient today.  Nurse call report to: [phone] Room 106, Ask for nurse [name]  [facility] arranged for transport ETA unknown.      Barriers to Discharge: Barriers Resolved   Patient Goals and CMS Choice Patient states their goals for this hospitalization and ongoing recovery are:: return to SNF rehab      Discharge Placement   Existing PASRR number confirmed : 08/21/20 ([id number])          Patient chooses bed at:  ([facility]) Patient to be transferred to facility by: [facility] Name of family member notified: Daughter [name] Patient and family notified of of transfer: 08/21/20  Discharge Plan and Services In-house Referral: Clinical Social Work                                   Social Determinants of Health (SDOH) Interventions     Readmission Risk Interventions No flowsheet data found.

## 2020-08-21 NOTE — Progress Notes (Signed)
CC: Weakness, multiple falls, lethargy, recurrent UTI  Subjective: She continues to look quite good.  She is asking about the IV for discharge she has both a midline and a right subclavian catheter.  Her abdomen is soft nontender.  She is tolerating regular diet.  She moves 1750 cc on her incentive spirometry this morning.  Overall doing well and awaiting placement at SNF.  Objective: Vital signs in last 24 hours: Temp:  [98.1 F (36.7 C)-98.8 F (37.1 C)] 98.1 F (36.7 C) (12/29 0557) Pulse Rate:  [47-48] 48 (12/29 0557) Resp:  [17-18] 17 (12/29 0557) BP: (116-154)/(63-65) 116/64 (12/29 0557) SpO2:  [97 %-98 %] 97 % (12/29 0557) Last BM Date: 08/20/20 1200 p.o. 200 IV 2425 urine IR drain 40 Stool x3 Afebrile vital signs are stable Prealbumin 17.5  Intake/Output from previous day: 12/28 0701 - 12/29 0700 In: 1385.8 [P.O.:1200; I.V.:5; IV Piggyback:165.8] Out: 2465 [Urine:2425; Drains:40] Intake/Output this shift: No intake/output data recorded.  General appearance: alert, cooperative and no distress Resp: clear to auscultation bilaterally and Moving 1750 on incentive spirometry; off O2. GI: soft, non-tender; bowel sounds normal; no masses,  no organomegaly and The IR drain is in place.  There is some cloudy fluid in the drain tubing.  Not much in the bag and some serous fluid at the base of the bag.  Lab Results:  Recent Labs    08/19/20 0408  WBC 7.8  HGB 8.1*  HCT 27.8*  PLT 215    BMET Recent Labs    08/19/20 0408  NA 143  K 4.0  CL 112*  CO2 25  GLUCOSE 92  BUN 11  CREATININE 1.36*  CALCIUM 7.9*   PT/INR No results for input(s): LABPROT, INR in the last 72 hours.  Recent Labs  Lab 08/19/20 0408  AST 13*  ALT 13  ALKPHOS 49  BILITOT 0.4  PROT 4.9*  ALBUMIN 2.6*     Lipase     Component Value Date/Time   LIPASE 26 05/14/2020 1514     Medications: . ALPRAZolam  1 mg Oral BID  . vitamin C  1,000 mg Oral QHS  . aspirin EC  81 mg  Oral Daily  . Chlorhexidine Gluconate Cloth  6 each Topical Daily  . donepezil  10 mg Oral Daily  . enoxaparin (LOVENOX) injection  40 mg Subcutaneous Q24H  . escitalopram  30 mg Oral Daily  . ferrous sulfate  325 mg Oral Q breakfast  . lamoTRIgine  150 mg Oral BID  . multivitamins with iron  1 tablet Oral Daily  . nadolol  10 mg Oral Daily  . pantoprazole  40 mg Oral Daily  . polyethylene glycol  17 g Oral Daily  . predniSONE  10 mg Oral Q breakfast  . rosuvastatin  5 mg Oral q1800  . saccharomyces boulardii  250 mg Oral BID  . sodium chloride flush  10-40 mL Intracatheter Q12H  . sodium chloride flush  3 mL Intravenous Q12H  . sodium chloride flush  5 mL Intracatheter Q8H    Assessment/Plan Psoriatic arthritis -previously on prednisone methotrexate Hx CAD/MI 3/21 Hx CVA -on Plavix Stage III kidney disease Hx multiple falls/severe deconditioning Hx malnutrition/failure to thrive Ongoing tobacco use x 42 years (Off cigarettes since 07/26/20; also off home O2) Hx anxiety/depression Moderate malnutrition -prealbumin 17.5(12/27) Anemia -H/H: 8.1/27.8   Colovesicular fistula due to diverticulitis with recurrent colovesicular fistula with abscess 08/15/2020 S/P robotic LAR, primary colovesicular fistula repair, mobilization of splenic flexure,  omental pedicle flap with omentopexy of bladder repair, assessment of tissue perfusion with firefly immunofluorescence, rigid proctoscopy, 07/17/2020 Dr. Karie Soda IR drain placement 08/16/2020 IR ultrasound-guided vascular access 08/19/2020  FEN: Regular diet ID: Keflex 12/20-12/23; Rocephin 12/24-12/26; Flagyl 12/24-12/26;        Zosyn 12/26 >> day 4 (4 weeks thru 09/17/19 -Dr. Rutha Bouchard -ID) DVT: Lovenox Follow-up: Dr. Michaell Cowing Pain: Vicodin 1 tablet x 3;  Plan: Patient is waiting for rehab bed.  She will need to follow-up with Dr. Michaell Cowing about a week after her IR drain evaluation.  Her IR drain is being flushed 3 times a day currently.   ID has instructions for antibiotics as listed above.  If we get a repeat date for her CT scan I can actually put in a specific day for her to come back and see Dr. Michaell Cowing.  Currently instructions just recommend calling for follow-up after the CT and drain evaluation.    LOS: 11 days    Valerie [name] 08/21/2020 Please see Amion

## 2020-08-21 NOTE — Plan of Care (Addendum)
Report was called to Vee at [phone]. All questions were answered and patient was taken to facility by sister.

## 2020-08-21 NOTE — Telephone Encounter (Signed)
Hi Margaret... If she is in rehab, yes they can do it there about 2 weeks from now  Thanks

## 2020-08-21 NOTE — [place] FL2 (Addendum)
[place] MEDICAID FL2 LEVEL OF CARE SCREENING TOOL     IDENTIFICATION  Patient Name: Valerie [name] Birthdate: 1960/12/26 Sex: female Admission Date (Current Location): 08/09/2020  [facility] and [place] Number:  Producer, television/film/video and Address:  [facility],  [address] [place], [place] [address]      Provider Number: [id number]  Attending Physician Name and Address:  Rodolph Bong, MD  Relative Name and Phone Number:  Anaiz Qazi [phone]    Current Level of Care: Hospital Recommended Level of Care: Skilled Nursing Facility Prior Approval Number:    Date Approved/Denied:   PASRR Number: [id number]  Discharge Plan: SNF    Current Diagnoses: Patient Active Problem List   Diagnosis Date Noted   Intra-abdominal abscess (HCC)    Hypercalcemia 08/09/2020   AKI (acute kidney injury) (HCC)    Hypoxemia 07/20/2020   Altered mental status 07/19/2020   Acute respiratory failure with hypoxemia (HCC) 07/19/2020   Anemia, chronic disease 07/18/2020   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 07/18/2020   Protein-calorie malnutrition, moderate (HCC) 06/05/2020   Generalized weakness 06/04/2020   CAD (coronary artery disease) 06/04/2020   Macrocytic anemia 06/04/2020   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 05/20/2020   Intra-abdominal infection    NSTEMI (non-ST elevated myocardial infarction) (HCC) 11/14/2019   DM type 2 (diabetes mellitus, type 2) (HCC) 11/14/2019   Depression    Anxiety    Chest pain    Obesity, Class III, BMI 40-49.9 (morbid obesity) (HCC)    CKD (chronic kidney disease), stage III (HCC)    Psoriatic arthritis (HCC)    Tobacco abuse    OSA (obstructive sleep apnea)    Neuropathy    Bipolar disorder (HCC)    Osteoarthritis of right hip 11/05/2014   Essential hypertension 10/29/2014    Orientation RESPIRATION BLADDER Height & Weight      Self,Time,Situation,Place  Normal Continent,External catheter Weight: 177 lb 14.4 oz (80.7 kg) Height:  5\' 2"  (157.5 cm)  BEHAVIORAL SYMPTOMS/MOOD NEUROLOGICAL BOWEL NUTRITION STATUS      Continent    AMBULATORY STATUS COMMUNICATION OF NEEDS Skin   Extensive Assist Verbally Surgical wounds                       Personal Care Assistance Level of Assistance  Bathing,Feeding,Dressing Bathing Assistance: Limited assistance Feeding assistance: Independent Dressing Assistance: Limited assistance     Functional Limitations Info             SPECIAL CARE FACTORS FREQUENCY  PT (By licensed PT),OT (By licensed OT)     PT Frequency: 5x/wk OT Frequency: 5x/wk            Contractures Contractures Info: Not present    Additional Factors Info  Code Status,Allergies,Psychotropic Code Status Info: Full Allergies Info: see MAR Psychotropic Info: see MAR         Current Medications (08/21/2020):  This is the current hospital active medication list Current Facility-Administered Medications  Medication Dose Route Frequency Provider Last Rate Last Admin   acetaminophen (TYLENOL) tablet 650 mg  650 mg Oral Q6H PRN 08/23/2020, MD   650 mg at 08/15/20 1250   Or   acetaminophen (TYLENOL) suppository 650 mg  650 mg Rectal Q6H PRN 08/17/20, MD       ALPRAZolam Arnetha Courser) tablet 1 mg  1 mg Oral BID Prudy Feeler, MD   1 mg at 08/21/20 08/23/20   ascorbic acid (VITAMIN  C) tablet 1,000 mg  1,000 mg Oral QHS Lorella Nimrod, MD   1,000 mg at 08/20/20 2045   aspirin EC tablet 81 mg  81 mg Oral Daily Lorella Nimrod, MD   81 mg at 08/21/20 [id number]   Chlorhexidine Gluconate Cloth 2 % PADS 6 each  6 each Topical Daily Florencia Reasons, MD   6 each at 08/21/20 0955   donepezil (ARICEPT) tablet 10 mg  10 mg Oral Daily Lorella Nimrod, MD   10 mg at 08/21/20 0955   enoxaparin (LOVENOX) injection 40 mg  40 mg Subcutaneous Q24H Ardis Rowan, PA-C   40 mg at 08/21/20 0955   escitalopram  (LEXAPRO) tablet 30 mg  30 mg Oral Daily Lorella Nimrod, MD   30 mg at 08/21/20 [id number]   ferrous sulfate tablet 325 mg  325 mg Oral Q breakfast Florencia Reasons, MD   325 mg at 08/21/20 0736   HYDROcodone-acetaminophen (NORCO/VICODIN) 5-325 MG per tablet 1 tablet  1 tablet Oral Q4H PRN Florencia Reasons, MD   1 tablet at 08/20/20 2045   lamoTRIgine (LAMICTAL) tablet 150 mg  150 mg Oral BID Lorella Nimrod, MD   150 mg at 08/21/20 0955   multivitamins with iron tablet 1 tablet  1 tablet Oral Daily Earnstine Regal, PA-C   1 tablet at 08/21/20 [id number]   nadolol (CORGARD) tablet 10 mg  10 mg Oral Daily Florencia Reasons, MD   10 mg at 08/21/20 0954   ondansetron (ZOFRAN) tablet 4 mg  4 mg Oral Q6H PRN Lorella Nimrod, MD   4 mg at 08/15/20 1833   Or   ondansetron (ZOFRAN) injection 4 mg  4 mg Intravenous Q6H PRN Lorella Nimrod, MD       pantoprazole (PROTONIX) EC tablet 40 mg  40 mg Oral Daily Lorella Nimrod, MD   40 mg at 08/21/20 0953   phenazopyridine (PYRIDIUM) tablet 100 mg  100 mg Oral TID PRN Florencia Reasons, MD   100 mg at 08/19/20 2140   piperacillin-tazobactam (ZOSYN) IVPB 3.375 g  3.375 g Intravenous Q8H Luiz Ochoa, RPH 12.5 mL/hr at 08/21/20 1023 3.375 g at 08/21/20 1023   polyethylene glycol (MIRALAX / GLYCOLAX) packet 17 g  17 g Oral Daily Lorella Nimrod, MD   17 g at 08/12/20 1023   predniSONE (DELTASONE) tablet 10 mg  10 mg Oral Q breakfast Lorella Nimrod, MD   10 mg at 08/21/20 0736   rosuvastatin (CRESTOR) tablet 5 mg  5 mg Oral q1800 Lorella Nimrod, MD   5 mg at 08/20/20 1712   saccharomyces boulardii (FLORASTOR) capsule 250 mg  250 mg Oral BID Earnstine Regal, PA-C   250 mg at 08/21/20 0955   sodium chloride flush (NS) 0.9 % injection 10-40 mL  10-40 mL Intracatheter Q12H Lorella Nimrod, MD   10 mL at 08/19/20 2141   sodium chloride flush (NS) 0.9 % injection 10-40 mL  10-40 mL Intracatheter PRN Lorella Nimrod, MD   30 mL at 08/18/20 2143   sodium chloride flush (NS) 0.9 % injection 3 mL  3 mL Intravenous  Q12H Lorella Nimrod, MD   3 mL at 08/21/20 0958   sodium chloride flush (NS) 0.9 % injection 5 mL  5 mL Intracatheter Q8H Arne Cleveland, MD   5 mL at 08/21/20 0605     Discharge Medications: Please see discharge summary for a list of discharge medications.  Relevant Imaging Results:  Relevant Lab Results:   Additional Information SSN [place]  Elmyra Ricks  Magnus Ivan, LCSW

## 2020-08-21 NOTE — Progress Notes (Signed)
Physical Therapy Treatment Patient Details Name: Valerie [name] MRN: [id number] DOB: November 13, 1960 Today's Date: 08/21/2020    History of Present Illness 59 y.o. 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 Dx of hypercalcemia, AKI. Patient s/p PICC line and IR drain    PT Comments    Pt feeling better but very "tired" today.  Assisted OOB. General bed mobility comments: require increased time and use of rail.  General transfer comment: Min guard to ambulate to bathroom with RW with cautions on turns and backward steps to commode.  25% VC's on hand placement and safety. General Gait Details: tolerated an increased distance but still limited by weakness and instability knees.  HIGH FALL RISK Due to extended hospital stay and weakness as well as gait instability, pt will need St rehab at [facility] prior to safely retuning home.   Follow Up Recommendations  SNF     Equipment Recommendations  None recommended by PT    Recommendations for Other Services       Precautions / Restrictions Precautions Precautions: Fall Precaution Comments: knees buckle    Mobility  Bed Mobility Overal bed mobility: Needs Assistance Bed Mobility: Supine to Sit;Sit to Supine     Supine to sit: Supervision Sit to supine: Supervision;Min guard   General bed mobility comments: require increased time and use of rail  Transfers Overall transfer level: Needs assistance Equipment used: Rolling walker (2 wheeled) Transfers: Sit to/from [employer] Sit to Stand: Min guard Stand pivot transfers: Min guard       General transfer comment: Min  guard to ambulate to bathroom with RW with cautions on turns and backward steps to commode.  25% VC's on hand placement and safety.  Ambulation/Gait Ambulation/Gait assistance: Min assist Gait Distance (Feet): 44 Feet Assistive device: Rolling walker (2 wheeled) Gait Pattern/deviations: Step-through pattern;Decreased stride length Gait velocity: decr   General Gait Details: tolerated an increased distance but still limited by weakness and instability knees.  HIGH FALL RISK   Stairs             Wheelchair Mobility    Modified Rankin (Stroke Patients Only)       Balance                                            Cognition Arousal/Alertness: Awake/alert Behavior During Therapy: WFL for tasks assessed/performed Overall Cognitive Status: Within Functional Limits for tasks assessed                                 General Comments: AxO x 3 very pleasant      Exercises      General Comments        Pertinent Vitals/Pain Pain Assessment: 0-10 Pain Location: "belly" Pain Descriptors / Indicators: Grimacing;Guarding Pain Intervention(s): Monitored during session;Repositioned    Home Living                      Prior Function  PT Goals (current goals can now be found in the care plan section) Progress towards PT goals: Progressing toward goals    Frequency    Min 2X/week      PT Plan Current plan remains appropriate    Co-evaluation              AM-PAC PT "6 Clicks" Mobility   Outcome Measure  Help needed turning from your back to your side while in a flat bed without using bedrails?: A Lot Help needed moving from lying on your back to sitting on the side of a flat bed without using bedrails?: A Lot Help needed moving to and from a bed to a chair (including a wheelchair)?: A Lot Help needed standing up from a chair using your arms (e.g., wheelchair or bedside chair)?: A Lot Help needed to walk  in hospital room?: A Lot Help needed climbing 3-5 steps with a railing? : Total 6 Click Score: 11    End of Session Equipment Utilized During Treatment: Gait belt Activity Tolerance: Patient limited by fatigue Patient left: in bed;with call bell/phone within reach Nurse Communication: Mobility status PT Visit Diagnosis: Unsteadiness on feet (R26.81)     Time: 1638-4536 PT Time Calculation (min) (ACUTE ONLY): 24 min  Charges:  $Gait Training: 8-22 mins $Therapeutic Activity: 8-22 mins                     Felecia Shelling  PTA Acute  Rehabilitation Services Pager      [phone] Office      [phone]

## 2020-08-21 NOTE — Progress Notes (Signed)
If patient is to be discharged, below are discharge instructions: - Flush drain once daily with 5-10 cc NS flush (patient will need an order for flushes upon discharge). RN aware to teach patient how to manage drains at home. - Record output from each drain once daily. - Follow-up at drain clinic 10-14 days after discharge for CT/possible drain injection (assess for possible drain removal)- IR schedulers to call patient to set up this appointment.  Alwyn Ren, [place] [phone] 08/21/2020, 3:18 PM

## 2020-08-22 ENCOUNTER — Other Ambulatory Visit: Payer: Self-pay | Admitting: Surgery

## 2020-08-22 DIAGNOSIS — N321 Vesicointestinal fistula: Secondary | ICD-10-CM

## 2020-08-26 ENCOUNTER — Ambulatory Visit (INDEPENDENT_AMBULATORY_CARE_PROVIDER_SITE_OTHER): Payer: Medicare Other | Admitting: Internal Medicine

## 2020-08-26 ENCOUNTER — Other Ambulatory Visit: Payer: Self-pay

## 2020-08-26 ENCOUNTER — Encounter: Payer: Self-pay | Admitting: Internal Medicine

## 2020-08-26 VITALS — BP 132/86 | HR 56 | Ht 62.0 in | Wt 199.8 lb

## 2020-08-26 DIAGNOSIS — E213 Hyperparathyroidism, unspecified: Secondary | ICD-10-CM | POA: Diagnosis not present

## 2020-08-26 NOTE — Progress Notes (Signed)
Patient ID: Valerie [name], female   DOB: 1961/03/21, 60 y.o.   MRN: [id number]   This visit occurred during the SARS-CoV-2 public health emergency.  Safety protocols were in place, including screening questions prior to the visit, additional usage of staff PPE, and extensive cleaning of exam room while observing appropriate contact time as indicated for disinfecting solutions.   HPI  Valerie [name] is a 60 y.o.-year-old female, referred by Dr. Erlinda Hong, for evaluation for hypercalcemia/hyperparathyroidism.  Patient was recently admitted to the hospital for metabolic encephalopathy in the setting of hypercalcemia after having colovesical fistula repair in 06/2020. She has another fistula >> will have repair Sx in 09/2019. Before her first Sx, she was on TPN for 6 weeks.  Calcium levels were elevated in 10/2019 when she had an AMI and a stroke.  During her recent admission, calcium level was found to be as high as 12.8.  The PTH was not suppressed, at 73.   While in the hospital, she was given Zolendronic acid x1, calcitonin, and hydrated and her calcium returned to normal and then decreased to even the lower limit of normal.  Of note, calcitriol level, vitamin D, TSH, ACE, PTHrp, 24-hour urine calcium and spot Ca/Cr ratio were all normal.  Phosphorus was low (lowest 1.0).  Pt was dx with hypercalcemia earlier this year. I reviewed pt's pertinent labs: Lab Results  Component Value Date   PTH 73 (H) 08/09/2020   PTH Comment 08/09/2020   CALCIUM 7.9 (L) 08/19/2020   CALCIUM 7.9 (L) 08/18/2020   CALCIUM 8.2 (L) 08/16/2020   CALCIUM 8.3 (L) 08/15/2020   CALCIUM 8.9 08/14/2020   CALCIUM 9.5 08/13/2020   CALCIUM 10.3 08/12/2020   CALCIUM 11.7 (H) 08/11/2020   CALCIUM 11.9 (H) 08/10/2020   CALCIUM 12.0 (H) 08/09/2020   CALCIUM 12.8 (H) 08/09/2020   CALCIUM 11.4 (H) 07/21/2020   CALCIUM 11.4 (H) 07/20/2020   CALCIUM 11.7 (H) 07/19/2020   CALCIUM 11.6 (H) 07/19/2020   CALCIUM 10.7 (H) 07/18/2020    CALCIUM 11.3 (H) 07/17/2020   CALCIUM 10.1 07/08/2020   CALCIUM 9.3 06/13/2020   CALCIUM 9.8 06/10/2020   CALCIUM 10.1 06/09/2020   CALCIUM 10.4 (H) 06/08/2020   CALCIUM 10.7 (H) 06/07/2020   CALCIUM 10.5 (H) 06/06/2020   CALCIUM 10.8 (H) 06/05/2020   CALCIUM 11.5 (H) 06/03/2020   CALCIUM 11.2 (H) 05/15/2020   CALCIUM 12.9 (H) 05/14/2020   CALCIUM 9.8 02/20/2020   CALCIUM 10.2 11/14/2019   CALCIUM 10.4 (H) 11/13/2019   Lab Results  Component Value Date   PHOS 3.0 08/18/2020   PHOS 2.2 (L) 08/16/2020   PHOS 1.9 (L) 08/15/2020   PHOS 1.8 (L) 08/14/2020   PHOS 1.0 (LL) 08/13/2020   PHOS 1.1 (L) 08/12/2020   PHOS 2.3 (L) 08/11/2020   PHOS 2.7 08/09/2020   PHOS 1.9 (L) 07/21/2020   PHOS 3.1 07/18/2020   Component     Latest Ref Rng & Units 08/09/2020 08/10/2020 08/11/2020 08/14/2020  Calcium, Ur     Not Estab. mg/dL  6.0 6.2   Calcium/Creat.Ratio     29 - 442 mg/g creat  154    Creatinine, Urine     Not Estab. mg/dL  39.0    Calcium, 24 hour urine     0 - 320 mg/24 hr   143   Vit D, 1,25-Dihydroxy     19.9 - 79.3 pg/mL 33.2     PTH-related peptide     pmol/L   <2.0   Angiotensin-Converting  Enzyme     14 - 82 U/L    41   FE Ca = 0.02  A thyroid ultrasound (01/05/2020) showed 4 nodules, but none of them reported as possible parathyroid adenomas: THYROID ULTRASOUND  INDICATION: Nontoxic single thyroid nodule thyroid nodules seen on carotid ultrasound performed at doctor's office  COMPARISON: None  ISTHMUS:  - Size: 0.4 cm.   RIGHT LOBE:  - Size: 3.6 x 1.2 x 1.3 cm.  - Echogenicity: Normal.   LEFT LOBE:  - Size: 3.9 x 2.4 x 2.7 cm.  - Echogenicity: Normal.   NODULES:   - Nodule 1:  -- Size: 0.7 x 0.6 x 0.7 cm (long x AP x trans).  -- Location: Right mid.  -- Composition: spongiform (0)  -- Echogenicity: hypoechoic (2)  -- Shape: wider-than-tall (0)  -- Margins: ill-defined (0)  -- Echogenic foci: none (0)  -- ACR TI-RADS total points and risk category:  2 Points - TR2.   - Nodule 2:  -- Size: 2.4 x 1.6 x 2 cm (long x AP x trans).  -- Location: Left mid.  -- Composition: solid or almost completely solid (2)  -- Echogenicity: hypoechoic (2)  -- Shape: wider-than-tall (0)  -- Margins: ill-defined (0)  -- Echogenic foci: none (0)  -- ACR TI-RADS total points and risk category: 4 Points - TR4.    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 [facility]da System Category II  - Nodule 3:  -- Size: 0.6 x 0.4 x 0.6 cm (long x AP x trans).  -- Location: Left lower.  -- Composition: cystic or almost completely cystic (0)   - Nodule 4:  -- Size: 0.8 x 0.6 x 0.7 cm (long x AP x trans).  -- Location: Left lower.  -- Composition: solid or almost completely solid (2)  -- Echogenicity: isoechoic (1)  -- Shape: wider-than-tall (0)  -- Margins: smooth (0)  -- Echogenic foci: none (0)  -- ACR TI-RADS total points and risk category: 3 Points - TR3.   She is on B complex.  No previous DXA scan reports available for review. No h/o OP.  No fractures or falls.   + h/o kidney stone x1 "years ago".  +  CKD. Last BUN/Cr: Lab Results  Component Value Date   BUN 11 08/19/2020   BUN 14 08/18/2020   CREATININE 1.36 (H) 08/19/2020   CREATININE 1.48 (H) 08/18/2020   Pt is not on HCTZ.  No h/o vitamin D deficiency. Reviewed vit D levels: Lab Results  Component Value Date   VD25OH 68.85 08/09/2020   Pt is not on calcium but takes vitamin D 1000 units daily.  Pt does not have a FH of hypercalcemia, pituitary tumors, thyroid cancer, or osteoporosis.   Pt. also has a history of CAD, history of NSTEMI.   She was dx'ed with scurvy several years ago.  Also has a history of peripheral neuropathy, psoriatic arthritis, prediabetes, hyperlipidemia  ROS: Constitutional: no weight gain/loss, no fatigue, + subjective  hyperthermia, + nocturia Eyes: + Blurry vision, no xerophthalmia ENT: no sore throat, no nodules palpated in throat, no dysphagia/odynophagia, no hoarseness Cardiovascular: no CP/SOB/palpitations/+ leg swelling Respiratory: no cough/SOB Gastrointestinal: no N/V/+ D/no C Musculoskeletal: no muscle/+ joint aches Skin: no rashes, + easy bruising Neurological: no tremors/numbness/tingling/dizziness Psychiatric: + Both: Depression/anxiety  Past Medical History:  Diagnosis Date  . Anemia   . Anxiety   .  Arthritis    Psoriatic  . Bipolar disorder ([place])   . CKD (chronic kidney disease), stage III ([place])   . Colovesical fistula   . Depression   . DM type 2 (diabetes mellitus, type 2) ([place]) 11/14/2019  . Dyspnea   . Headache   . Neuropathy   . NSTEMI (non-ST elevated myocardial infarction) ([place]) 11/14/2019  . OSA (obstructive sleep apnea)   . Psoriatic arthritis ([place])   . Sleep apnea   . 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 Stroke [facility])    Memory issues, March 2021   Past Surgical History:  Procedure Laterality Date  . APPENDECTOMY    . CHOLECYSTECTOMY    . CYSTOSCOPY WITH STENT PLACEMENT N/A 07/17/2020   Procedure: CYSTOSCOPY WITH BILATERAL FIREFLY INJECTION;  Surgeon: Raynelle Bring, MD;  Location: [facility];  Service: Urology;  Laterality: N/A;  . FLEXIBLE SIGMOIDOSCOPY N/A 05/17/2020   Procedure: FLEXIBLE SIGMOIDOSCOPY;  Surgeon: Carol Ada, MD;  Location: [facility] END[facility]COPY;  Service: Endoscopy;  Laterality: N/A;  . FOOT SURGERY Right   . IR FLUORO GUIDE CV LINE RIGHT  08/19/2020  . IR US GUIDE VASC ACCESS RIGHT  08/19/2020  . LEFT HEART CATH AND CORONARY ANGIOGRAPHY N/A 11/14/2019   Procedure: LEFT HEART CATH AND CORONARY ANGIOGRAPHY;  Surgeon: Martinique, Peter M, MD;  Location: [facility] CV LAB;  Service: Cardiovascular;  Laterality: N/A;  . PROCTOSCOPY N/A 07/17/2020   Procedure: RIGID PROCTOSCOPY;  Surgeon: Michael Boston, MD;  Location: [facility];  Service: General;   Laterality: N/A;  . TONSILLECTOMY    . TOTAL HIP ARTHROPLASTY Bilateral    Social History   Socioeconomic History  . Marital status: Divorced    Spouse name: Not on file  . Number of children: 2  . Years of education: Not on file  . Highest education level: Not on file  Occupational History  . Occupation: Disabled  Tobacco Use  . Smoking status: Current Every Day Smoker    Packs/day: 0.50  . Smokeless tobacco: Never Used  Vaping Use  . Vaping Use: Never used  Substance and Sexual Activity  . Alcohol use: Not Currently  . Drug use: Never  . Sexual activity: Not on file  Other Topics Concern  . Not on file  Social History Narrative  . Not on file   Social Determinants of Health   Financial Resource Strain: Not on file  Food Insecurity: Not on file  Transportation Needs: Not on file  Physical Activity: Not on file  Stress: Not on file  Social Connections: Not on file  Intimate Partner Violence: Not on file   Current Outpatient Medications on File Prior to Visit  Medication Sig Dispense Refill  . ALPRAZolam (XANAX) 1 MG tablet Take 1 tablet (1 mg total) by mouth 2 (two) times daily. 20 tablet 0  . Ascorbic Acid (VITAMIN C) 1000 MG tablet Take 1,000 mg by mouth at bedtime.    Marland Kitchen aspirin EC 81 MG tablet Take 1 tablet (81 mg total) by mouth daily.    Marland Kitchen b complex vitamins tablet Take 2 tablets by mouth at bedtime.    . Cholecalciferol (VITAMIN D-3) 1000 units CAPS Take 1,000 Units by mouth daily.     . clopidogrel (PLAVIX) 75 MG tablet Take 75 mg by mouth daily.    Marland Kitchen docusate sodium (COLACE) 100 MG capsule Take 100 mg by mouth at bedtime.    . donepezil (ARICEPT) 10 MG tablet Take 10 mg by mouth daily.    Marland Kitchen  enoxaparin (LOVENOX) 40 MG/0.4ML injection Inject 0.4 mLs (40 mg total) into the skin daily. 0 mL   . escitalopram (LEXAPRO) 20 MG tablet Take 30 mg by mouth daily.   0  . ferrous sulfate 325 (65 FE) MG tablet Take 1 tablet (325 mg total) by mouth daily with breakfast.  3   . furosemide (LASIX) 20 MG tablet Take 1 tablet (20 mg total) by mouth daily. 30 tablet 1  . HYDROcodone-acetaminophen (NORCO) 10-325 MG tablet Take 0.5-1 tablets by mouth every 6 (six) hours as needed for moderate pain or severe pain. 20 tablet 0  . lamoTRIgine (LAMICTAL) 150 MG tablet Take 150 mg by mouth 2 (two) times daily.   0  . nadolol (CORGARD) 20 MG tablet Take 0.5 tablets (10 mg total) by mouth daily. 90 tablet 1  . nitroGLYCERIN (NITROSTAT) 0.4 MG SL tablet Place 1 tablet (0.4 mg total) under the tongue every 5 (five) minutes as needed for chest pain. 30 tablet 0  . omeprazole (PRILOSEC) 20 MG capsule Take 1 capsule (20 mg total) by mouth 2 (two) times daily before a meal. 60 capsule 3  . ondansetron (ZOFRAN) 8 MG tablet Take 8 mg by mouth 2 (two) times daily as needed for nausea or vomiting.     . phenazopyridine (PYRIDIUM) 100 MG tablet Take 1 tablet (100 mg total) by mouth 3 (three) times daily as needed (bladder spasm). 10 tablet 0  . piperacillin-tazobactam (ZOSYN) IVPB Inject 12.5 g into the vein daily. As continuous infusion Indication:  Abdominal abscess First Dose: Yes Last Day of Therapy:  09/16/20 Labs - Once weekly:  CBC/D and BMP, Labs - Every other week:  ESR and CRP Method of administration: Elastomeric (Continuous infusion) Method of administration may be changed at the discretion of home infusion pharmacist based upon assessment of the patient and/or caregiver's ability to self-administer the medication ordered. 28 Units 0  . polyethylene glycol (MIRALAX / GLYCOLAX) 17 g packet Take 17 g by mouth daily. 14 each 0  . predniSONE (DELTASONE) 10 MG tablet Take 5-10 mg by mouth See admin instructions. Take 5 mg by mouth every other day, alternating with 10 mg on opposing days    . rizatriptan (MAXALT-MLT) 10 MG disintegrating tablet Take 10 mg by mouth See admin instructions. Take one tablet (10 mg) by mouth daily as needed for migraine. May repeat in 2 hours if still needed     . saccharomyces boulardii (FLORASTOR) 250 MG capsule Take 1 capsule (250 mg total) by mouth 2 (two) times daily.    . traZODone (DESYREL) 100 MG tablet Take 2 tablets (200 mg total) by mouth at bedtime. 60 tablet 0  . Multiple Vitamins-Iron (MULTIVITAMINS WITH IRON) TABS tablet Take 1 tablet by mouth daily. (Patient not taking: Reported on 08/26/2020)  0  . rosuvastatin (CRESTOR) 5 MG tablet Take 1 tablet (5 mg total) by mouth daily. 90 tablet 3   No current facility-administered medications on file prior to visit.   Allergies  Allergen Reactions  . Carbamazepine Other (See Comments)    Parkinsons like symptoms tremors  . Lyrica [Pregabalin] Other (See Comments)    Sedation Other reaction(s): sedated  . Sertraline Hcl Other (See Comments)    Unknown reaction   Family History  Problem Relation Age of Onset  . Breast cancer Neg Hx     PE: BP 132/86   Pulse (!) 56   Ht _0  (1.575 m)   Wt 199 lb 12.8 oz (90.6  kg)   SpO2 93%   BMI 36.54 kg/m  Wt Readings from Last 3 Encounters:  08/26/20 199 lb 12.8 oz (90.6 kg)  08/09/20 177 lb 14.4 oz (80.7 kg)  07/22/20 208 lb 5.4 oz (94.5 kg)   Constitutional: overweight, in NAD. No kyphosis. In wheelchair. Eyes: PERRLA, EOMI, no exophthalmos ENT: moist mucous membranes, no thyromegaly, no nodules palpated, no cervical lymphadenopathy Cardiovascular: RRR, No MRG, + B LE edema Respiratory: CTA B Gastrointestinal: abdomen soft, NT, ND, BS+ Musculoskeletal: no deformities, strength intact in all 4 Skin: moist, warm, no rashes Neurological: no tremor with outstretched hands, DTR normal in all 4  Assessment: 1. Hypercalcemia/hyperparathyroidism  2. Thyroid nodules  Plan: Patient has had several instances of elevated calcium, with the highest level being at 12.8-12.9. A corresponding intact PTH level was also high, at 72.  - Patient also  has vitamin D deficiency,  with the last level being 68.8.  - No apparent complications from  hypercalcemia: no h/o nephrolithiasis, no osteoporosis, no fractures. No abdominal pain, depression, bone pain. - I discussed with the patient and her daughter about the physiology of calcium and parathyroid hormone, and possible side effects from increased PTH, including kidney stones, osteoporosis, abdominal pain, etc.  - We discussed that we need to check whether her hyperparathyroidism is primary (Familial hypercalcemic hypocalciuria or parathyroid adenoma) or secondary (to conditions like: vitamin D deficiency, calcium malabsorption, hypercalciuria, renal insufficiency, etc.). - We reviewed together her previous investigation which was complete, with normal vitamin D and calcitriol level, low phosphorus, as expected in the setting of hyperparathyroidism, normal 24-hour urine calcium (fractional excretion of calcium 0.02), normal PTH RP, SPEP/UPEP, ACE.  She does have a history of CKD, which can elevate the PTH, however I would not expect such a high calcium level - We discussed possible consequences of hyperparathyroidism: ~1/3 pts will develop complications over 15 years (OP, nephrolithiasis).  - If the tests indicate a parathyroid adenoma, she agrees with a referral to surgery.  -We did discuss that she will need imaging tests.  She had a thyroid ultrasound that did not show any lesions consistent with a parathyroid adenoma, but she did have several nodules - Criteria for parathyroid surgery are:  . Increased calcium by more than 1 mg/dL above the upper limit of normal  . Kidney ds.  . Osteoporosis (or vertebral fracture) . Age <86 years old Newer criteria (2013): Marland Kitchen High UCa >400 mg/d and increased stone risk by biochemical stone risk analysis . Presence of nephrolithiasis or nephrocalcinosis . Pt's preference!  - We may need to check a DXA scan to see if she has osteoporosis (+ add a 33% distal radius for evaluation of cortical bone, which is predominantly affected by hyperparathyroidism).   - I will see the patient back in 3 months  2. Thyroid nodules -Patient with recent diagnosis of 3 subcentimeter and 1 supra centimeters thyroid nodules, of which the largest nodule was biopsied with benign results earlier in the year. -She denies neck compression symptoms -Latest TSH was normal -We will continue to follow the nodules expectantly and get another thyroid ultrasound in a year from the previous  Philemon Kingdom, MD PhD [facility] Endocrinology

## 2020-08-26 NOTE — Patient Instructions (Signed)
I will refer you to see Dr. Gerrit Friends.    Please stay well-hydrated.  Please return to see me in 3 months.   Hypercalcemia Hypercalcemia is when the level of calcium in a person's blood is above normal. The body needs calcium to make bones and keep them strong. Calcium also helps the muscles, nerves, brain, and heart work the way they should. Most of the calcium in the body is in the bones. There is also some calcium in the blood. Hypercalcemia can happen when calcium comes out of the bones, or when the kidneys are not able to remove calcium from the blood. Hypercalcemia can be mild or severe. What are the causes? There are many possible causes of hypercalcemia. Common causes of this condition include:  Hyperparathyroidism. This is a condition in which the body produces too much parathyroid hormone. There are four parathyroid glands in your neck. These glands produce a chemical messenger (hormone) that helps the body absorb calcium from foods and helps your bones release calcium.  Certain kinds of cancer. Less common causes of hypercalcemia include:  Getting too much calcium or vitamin D from your diet.  Kidney failure.  Hyperthyroidism.  Severe dehydration.  Being on bed rest or being inactive for a long time.  Certain medicines.  Infections. What increases the risk? You are more likely to develop this condition if you:  Are female.  Are 61 years of age or older.  Have a family history of hypercalcemia. What are the signs or symptoms? Mild hypercalcemia that starts slowly may not cause symptoms. Severe, sudden hypercalcemia is more likely to cause symptoms, such as:  Being more thirsty than usual.  Needing to urinate more often than usual.  Abdominal pain.  Nausea and vomiting.  Constipation.  Muscle pain, twitching, or weakness.  Feeling very tired. How is this diagnosed?  Hypercalcemia is usually diagnosed with a blood test. You may also have tests to help  determine what is causing this condition, such as imaging tests and more blood tests. How is this treated? Treatment for hypercalcemia depends on the cause. Treatment may include:  Receiving fluids through an IV.  Medicines that: ? Keep calcium levels steady after receiving fluids (loop diuretics). ? Keep calcium in your bones (bisphosphonates). ? Lower the calcium level in your blood.  Surgery to remove overactive parathyroid glands.  A procedure that filters your blood to correct calcium levels (hemodialysis). Follow these instructions at home:   Take over-the-counter and prescription medicines only as told by your health care provider.  Follow instructions from your health care provider about eating or drinking restrictions.  Drink enough fluid to keep your urine pale yellow.  Stay active. Weight-bearing exercise helps to keep calcium in your bones. Follow instructions from your health care provider about what type and level of exercise is safe for you.  Keep all follow-up visits as told by your health care provider. This is important. Contact a health care provider if you have:  A fever.  A heartbeat that is irregular or very fast.  Changes in mood, memory, or personality. Get help right away if you:  Have severe abdominal pain.  Have chest pain.  Have trouble breathing.  Become very confused and sleepy.  Lose consciousness. Summary  Hypercalcemia is when the level of calcium in a person's blood is above normal. The body needs calcium to make bones and keep them strong. Calcium also helps the muscles, nerves, brain, and heart work the way they should.  There  are many possible causes of hypercalcemia, and treatment depends on the cause.  Take over-the-counter and prescription medicines only as told by your health care provider.  Follow instructions from your health care provider about eating or drinking restrictions. This information is not intended to replace  advice given to you by your health care provider. Make sure you discuss any questions you have with your health care provider. Document Revised: 09/06/2018 Document Reviewed: 05/16/2018 Elsevier Patient Education  2020 [employer].

## 2020-09-10 ENCOUNTER — Ambulatory Visit
Admission: RE | Admit: 2020-09-10 | Discharge: 2020-09-10 | Disposition: A | Discharge status: Home / Self Care | Payer: Medicare Other | Source: Ambulatory Visit | Attending: Student | Admitting: Student

## 2020-09-10 ENCOUNTER — Ambulatory Visit
Admission: RE | Admit: 2020-09-10 | Discharge: 2020-09-10 | Disposition: A | Discharge status: Home / Self Care | Payer: Medicare Other | Source: Ambulatory Visit | Attending: Surgery | Admitting: Surgery

## 2020-09-10 ENCOUNTER — Encounter: Payer: Self-pay | Admitting: Radiology

## 2020-09-10 ENCOUNTER — Other Ambulatory Visit: Payer: Self-pay | Admitting: Surgery

## 2020-09-10 ENCOUNTER — Other Ambulatory Visit: Payer: Medicare Other

## 2020-09-10 DIAGNOSIS — N321 Vesicointestinal fistula: Secondary | ICD-10-CM

## 2020-09-10 HISTORY — PX: IR RADIOLOGIST EVAL & MGMT: IMG5224

## 2020-09-10 MED ORDER — IOPAMIDOL (ISOVUE-300) INJECTION 61%
80.0000 mL | Freq: Once | INTRAVENOUS | Status: AC | PRN
  Administered 2020-09-10: 80 mL via INTRAVENOUS

## 2020-09-10 NOTE — Progress Notes (Signed)
Referring Physician(s): Dr. Redmond Pulling  Chief Complaint: The patient is seen in follow up today s/p LLQ abscess drain placed 08/17/20 by Dr. Vernard Gambles.   History of present illness:  Valerie [name], 60 year old female, has a medical history significant for obesity, psoriatic arthritis on chronic prednisone, OSA, CKD, bipolar disorder and tobacco use. She is status post robotic low anterior rectosigmoid resection with primary colovesical fistula repair, mobilization of splenic flexure, omental pedical flab with omentopexy of bladder repair 07/17/20 by Dr. 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   She presented to the hospital on 08/09/20 with complaints of falls, weakness and increasing lethargy. She was found to have a UTI and CT imaging showed an 8.0 x 4.0 x 4.4 cm abscess collection adjacent to the bladder and sigmoid colon. Interventional Radiology was asked to evaluate this patient for an aspiration with drain placement and this was done 08/17/20. She was discharged to a skilled nursing facility 08/21/20.   She presents today to the outpatient clinic for follow up CT imaging and drain injection. She is currently on antibiotics. She is afebrile and denies nausea, vomiting or diarrhea. She has no pain or discomfort in her abdomen. The LLQ drain is in place as well as a right IJ tunneled central line (placed in IR) for antibiotics. A foley catheter is also present.   Past Medical History:  Diagnosis Date  . Anemia   . Anxiety   . Arthritis    Psoriatic  . Bipolar disorder ([place])   . CKD (chronic kidney disease), stage III ([place])   . Colovesical fistula   . Depression   . DM type 2 (diabetes mellitus, type 2) ([place]) 11/14/2019  . Dyspnea   . Headache   . Neuropathy   . NSTEMI (non-ST elevated myocardial infarction) ([place]) 11/14/2019  . OSA (obstructive sleep apnea)   . Psoriatic arthritis ([place])   . Sleep apnea   . 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  Stroke [facility])    Memory issues, March 2021    Past Surgical History:  Procedure Laterality Date  . APPENDECTOMY    . CHOLECYSTECTOMY    . CYSTOSCOPY WITH STENT PLACEMENT N/A 07/17/2020   Procedure: CYSTOSCOPY WITH BILATERAL FIREFLY INJECTION;  Surgeon: Raynelle Bring, MD;  Location: [facility];  Service: Urology;  Laterality: N/A;  . FLEXIBLE SIGMOIDOSCOPY N/A 05/17/2020   Procedure: FLEXIBLE SIGMOIDOSCOPY;  Surgeon: Carol Ada, MD;  Location: [facility];  Service: Endoscopy;  Laterality: N/A;  . FOOT SURGERY Right   . IR FLUORO GUIDE CV LINE RIGHT  08/19/2020  . IR RADIOLOGIST EVAL & MGMT  09/10/2020  . IR US GUIDE VASC ACCESS RIGHT  08/19/2020  . LEFT HEART CATH AND CORONARY ANGIOGRAPHY N/A 11/14/2019   Procedure: LEFT HEART CATH AND CORONARY ANGIOGRAPHY;  Surgeon: Martinique, Peter M, MD;  Location: [place] CV [facility];  Service: Cardiovascular;  Laterality: N/A;  . PROCTOSCOPY N/A 07/17/2020   Procedure: RIGID PROCTOSCOPY;  Surgeon: Michael Boston, MD;  Location: [facility];  Service: General;  Laterality: N/A;  . TONSILLECTOMY    . TOTAL HIP ARTHROPLASTY Bilateral     Allergies: Carbamazepine, Lyrica [pregabalin], and Sertraline hcl  Medications: Prior to Admission medications   Medication Sig Start Date End Date Taking? Authorizing Provider  ALPRAZolam Duanne Moron) 1 MG tablet Take 1 tablet (1 mg total) by mouth 2 (two) times daily. 08/21/20   Eugenie Filler, MD  Ascorbic Acid (VITAMIN C) 1000 MG tablet Take 1,000 mg by mouth at  bedtime.    [provider]  aspirin EC 81 MG tablet Take 1 tablet (81 mg total) by mouth daily. 11/14/19 11/13/20  Samuella Cota, MD  b complex vitamins tablet Take 2 tablets by mouth at bedtime.    [provider]  Cholecalciferol (VITAMIN D-3) 1000 units CAPS Take 1,000 Units by mouth daily.     [provider]  clopidogrel (PLAVIX) 75 MG tablet Take 75 mg by mouth daily.    [provider]  docusate sodium (COLACE) 100 MG  capsule Take 100 mg by mouth at bedtime.    [provider]  donepezil (ARICEPT) 10 MG tablet Take 10 mg by mouth daily. 10/30/19   [provider]  enoxaparin (LOVENOX) 40 MG/0.4ML injection Inject 0.4 mLs (40 mg total) into the skin daily. 08/22/20   Eugenie Filler, MD  escitalopram (LEXAPRO) 20 MG tablet Take 30 mg by mouth daily.  09/10/15   [provider]  ferrous sulfate 325 (65 FE) MG tablet Take 1 tablet (325 mg total) by mouth daily with breakfast. 08/22/20   Eugenie Filler, MD  furosemide (LASIX) 20 MG tablet Take 1 tablet (20 mg total) by mouth daily. 07/23/20   Michael Boston, MD  HYDROcodone-acetaminophen (NORCO) 10-325 MG tablet Take 0.5-1 tablets by mouth every 6 (six) hours as needed for moderate pain or severe pain. 08/21/20   Eugenie Filler, MD  lamoTRIgine (LAMICTAL) 150 MG tablet Take 150 mg by mouth 2 (two) times daily.  02/27/18   [provider]  Multiple Vitamins-Iron (MULTIVITAMINS WITH IRON) TABS tablet Take 1 tablet by mouth daily. Patient not taking: Reported on 08/26/2020 08/22/20   Eugenie Filler, MD  nadolol (CORGARD) 20 MG tablet Take 0.5 tablets (10 mg total) by mouth daily. 12/04/19   [name], MD  nitroGLYCERIN (NITROSTAT) 0.4 MG SL tablet Place 1 tablet (0.4 mg total) under the tongue every 5 (five) minutes as needed for chest pain. 11/14/19   Samuella Cota, MD  omeprazole (PRILOSEC) 20 MG capsule Take 1 capsule (20 mg total) by mouth 2 (two) times daily before a meal. 06/12/20 10/10/20  Barb Merino, MD  ondansetron (ZOFRAN) 8 MG tablet Take 8 mg by mouth 2 (two) times daily as needed for nausea or vomiting.  05/02/20   [provider]  phenazopyridine (PYRIDIUM) 100 MG tablet Take 1 tablet (100 mg total) by mouth 3 (three) times daily as needed (bladder spasm). 08/21/20   Eugenie Filler, MD  piperacillin-tazobactam (ZOSYN) IVPB Inject 12.5 g into the vein daily. As continuous  infusion Indication:  Abdominal abscess First Dose: Yes Last Day of Therapy:  09/16/20 Labs - Once weekly:  CBC/D and BMP, Labs - Every other week:  ESR and CRP Method of administration: Elastomeric (Continuous infusion) Method of administration may be changed at the discretion of home infusion pharmacist based upon assessment of the patient and/or caregiver's ability to self-administer the medication ordered. 08/21/20   Eugenie Filler, MD  polyethylene glycol (MIRALAX / GLYCOLAX) 17 g packet Take 17 g by mouth daily. 08/22/20   Eugenie Filler, MD  predniSONE (DELTASONE) 10 MG tablet Take 5-10 mg by mouth See admin instructions. Take 5 mg by mouth every other day, alternating with 10 mg on opposing days    [provider]  rizatriptan (MAXALT-MLT) 10 MG disintegrating tablet Take 10 mg by mouth See admin instructions. Take one tablet (10 mg) by mouth daily as needed for migraine. May  repeat in 2 hours if still needed    [provider]  rosuvastatin (CRESTOR) 5 MG tablet Take 1 tablet (5 mg total) by mouth daily. 03/21/20 07/03/20  [name], MD  saccharomyces boulardii (FLORASTOR) 250 MG capsule Take 1 capsule (250 mg total) by mouth 2 (two) times daily. 08/21/20   Eugenie Filler, MD  traZODone (DESYREL) 100 MG tablet Take 2 tablets (200 mg total) by mouth at bedtime. 07/23/20   Earnstine Regal, PA-C     Family History  Problem Relation Age of Onset  . Breast cancer Neg Hx     Social History   Socioeconomic History  . Marital status: Divorced    Spouse name: Not on file  . Number of children: 2  . Years of education: Not on file  . Highest education level: Not on file  Occupational History  . Occupation: Disabled  Tobacco Use  . Smoking status: Current Every Day Smoker    Packs/day: 0.50  . Smokeless tobacco: Never Used  Vaping Use  . Vaping Use: Never used  Substance and Sexual Activity  . Alcohol use: Not Currently  . Drug use: Never   . Sexual activity: Not on file  Other Topics Concern  . Not on file  Social History Narrative  . Not on file   Social Determinants of Health   Financial Resource Strain: Not on file  Food Insecurity: Not on file  Transportation Needs: Not on file  Physical Activity: Not on file  Stress: Not on file  Social Connections: Not on file     Vital Signs: BP (!) 104/53   Pulse 67   Temp 98.1 F (36.7 C)   SpO2 97%   Physical Exam Constitutional:      Appearance: She is obese.  Cardiovascular:     Comments: RIJ tunneled central line Pulmonary:     Effort: Pulmonary effort is normal.  Abdominal:     Palpations: Abdomen is soft.     Tenderness: There is no abdominal tenderness.     Comments: LLQ drain to gravity bag with approximately 30 ml of thin, light purulent fluid. Skin insertion site is without erythema or drainage. Some dried, white skin surrounds the skin insertion site. Sutures and stat lock in place. Patient states the sutures sometimes pull and cause discomfort.   Genitourinary:    Comments: Foley catheter Musculoskeletal:     Comments: Ambulates with a walker  Skin:    General: Skin is warm and dry.  Neurological:     Mental Status: She is alert and oriented to person, place, and time.     Imaging: CT ABDOMEN PELVIS W CONTRAST  Result Date: 09/10/2020 CLINICAL DATA:  Status post percutaneous catheter drainage of left-sided pelvic abscess on 08/16/2020. History of sigmoid colonic resection and colovesical fistula repair on 07/17/2020. EXAM: CT ABDOMEN AND PELVIS WITH CONTRAST TECHNIQUE: Multidetector CT imaging of the abdomen and pelvis was performed using the standard protocol following bolus administration of intravenous contrast. CONTRAST:  63m ISOVUE-300 IOPAMIDOL (ISOVUE-300) INJECTION 61% COMPARISON:  08/15/2020 FINDINGS: Lower chest: Minimal atelectasis at the posterior right lung base. No pleural effusions. Hepatobiliary: No focal liver abnormality is  seen. Status post cholecystectomy. No biliary dilatation. Pancreas: Unremarkable. No pancreatic ductal dilatation or surrounding inflammatory changes. Spleen: Normal in size without focal abnormality. Adrenals/Urinary Tract: Adrenal glands are unremarkable. Kidneys are normal, without renal calculi, focal lesion, or hydronephrosis. Bladder is largely obscured by artifact from bilateral hip prostheses but appears to be  decompressed by a Foley catheter. Stomach/Bowel: Bowel shows no evidence of obstruction, ileus or inflammation. No free air identified. Anastomosis at the level of the rectosigmoid colon noted in the pelvis. No evidence of fluid collection or extraluminal air at the level of the anastomosis or drained abscess. Vascular/Lymphatic: Minimal distal aortic atherosclerosis. No enlarged abdominal or pelvic lymph nodes. Reproductive: Status post hysterectomy. No adnexal masses. Other: Percutaneous drain extends into the left pelvis with no further abscess identified. The drain abuts the superior and lateral aspect of the proximal sigmoid colon. No new abscess identified. No abdominal wall hernia or abnormality. No abdominopelvic ascites. Musculoskeletal: No acute or significant osseous findings. IMPRESSION: No evidence of further abscess at the level of the drained left pelvic abscess. The drain abuts the superior and lateral aspect of the proximal sigmoid colon. No new abscess identified. A drain injection was performed under fluoroscopy following the CT scan and is reported separately. Electronically Signed   By: Aletta Edouard M.D.   On: 09/10/2020 14:28   IR Radiologist Eval & Mgmt  Result Date: 09/10/2020 Please refer to notes tab for details about interventional procedure. (Op Note)   Labs:  CBC: Recent Labs    08/11/20 0438 08/15/20 0535 08/16/20 0608 08/19/20 0408  WBC 8.7 9.5 6.9 7.8  HGB 8.7* 8.3* 8.6* 8.1*  HCT 28.6* 26.7* 27.9* 27.8*  PLT 201 183 194 215    COAGS: Recent  Labs    08/16/20 0608  INR 1.1    BMP: Recent Labs    11/14/19 0002 02/20/20 1138 05/14/20 1514 05/15/20 0210 06/03/20 1339 08/15/20 0535 08/16/20 0608 08/18/20 0433 08/19/20 0408  NA 143 137 136 135   < > 140 142 146* 143  K 5.1 4.0 4.1 3.7   < > 3.3* 3.6 3.4* 4.0  CL 108 103 95* 102   < > 108 111 113* 112*  CO2 '30 23 30 27   ' < > 21* '24 24 25  ' GLUCOSE 127* 113* 106* 87   < > 93 90 84 92  BUN 26* '18 16 15   ' < > '11 13 14 11  ' CALCIUM 10.2 9.8 12.9* 11.2*   < > 8.3* 8.2* 7.9* 7.9*  CREATININE 1.62* 1.73* 1.86* 1.67*   < > 1.32* 1.41* 1.48* 1.36*  GFRNONAA 34* 32* 29* 33*   < > 46* 43* 40* 45*  GFRAA 40* 37* 33* 38*  --   --   --   --   --    < > = values in this interval not displayed.    LIVER FUNCTION TESTS: Recent Labs    07/19/20 0906 08/09/20 1312 08/10/20 0652 08/11/20 0438 08/12/20 0428 08/13/20 0320 08/14/20 0350 08/19/20 0408  BILITOT 0.9 0.5 0.5  --   --   --   --  0.4  AST '17 23 15  ' --   --   --   --  13*  ALT '14 22 16  ' --   --   --   --  13  ALKPHOS 65 83 69  --   --   --   --  49  PROT 6.1* 6.6 5.3*  --   --   --   --  4.9*  ALBUMIN 2.8* 3.3* 2.4*   < > 2.5* 2.5* 2.5* 2.6*   < > = values in this interval not displayed.    Assessment:  LLQ abscess with drain placed 08/09/20: CT imaging today shows no evidence of further abscess  at the level of the drained left pelvic abscess; no new abscess identified. Contrast drain injection demonstrates fistulas to the bowel and bladder. These findings were discussed with the patient and her daughter. The patient states she has a follow up appointment with Surgery some time in February. She is encouraged to keep this appointment. She was instructed to discontinue flushing the drain, maintain the drain to gravity and continue documenting the output. She will follow up at the clinic in approximately one month for a repeat drain injection. The patient and her daughter verbalized understanding of these instructions. They  know they can call the clinic with any questions or concerns.     Signed: Theresa Duty, NP 09/10/2020, 2:32 PM   Please refer to Dr. Margaretmary Dys attestation of this note for management and plan.

## 2020-09-11 ENCOUNTER — Encounter: Payer: Self-pay | Admitting: Internal Medicine

## 2020-09-11 ENCOUNTER — Ambulatory Visit (INDEPENDENT_AMBULATORY_CARE_PROVIDER_SITE_OTHER): Payer: Medicare Other | Admitting: Internal Medicine

## 2020-09-11 ENCOUNTER — Other Ambulatory Visit: Payer: Self-pay

## 2020-09-11 VITALS — BP 112/77 | HR 69 | Temp 97.8°F | Resp 16 | Ht 62.0 in | Wt 202.2 lb

## 2020-09-11 DIAGNOSIS — Z452 Encounter for adjustment and management of vascular access device: Secondary | ICD-10-CM | POA: Diagnosis not present

## 2020-09-11 DIAGNOSIS — K651 Peritoneal abscess: Secondary | ICD-10-CM

## 2020-09-11 DIAGNOSIS — N321 Vesicointestinal fistula: Secondary | ICD-10-CM

## 2020-09-11 DIAGNOSIS — B999 Unspecified infectious disease: Secondary | ICD-10-CM | POA: Diagnosis not present

## 2020-09-12 ENCOUNTER — Encounter: Payer: Self-pay | Admitting: Internal Medicine

## 2020-09-12 DIAGNOSIS — Z452 Encounter for adjustment and management of vascular access device: Secondary | ICD-10-CM | POA: Insufficient documentation

## 2020-09-12 NOTE — Progress Notes (Signed)
   Subjective:    Patient ID: Valerie [name], female    DOB: 07-25-1961, 60 y.o.   MRN: [id number]  HPI She is here for hsfu She has a history of diverticulitis, colovesicular fistula, sigmoid stenosis and s/p anterior rectosigmoid resection with colovesical repair on 07/17/20.  She did well until December and found then to have an intra abdominal abscess measuring 8 x 4 x 4.4 cm and another fluid collection in the anterior pelvic wall measuring 8 x 7 x 2.  Cultures grew E coli and Enterococcus avium and she was placed on piperacillin/tazobactam for a projected 4 weeks.  IR placed a drain on 12/25 and picc line placed.   She is here today for follow up.  She was reevaluated by IR 09/10/20 and CT with complete resolution of the abscess and no new abscess identified.  Drain injection is c/w a fistula from the drain site to adjacent sigmoid colon and colovesical fistula from sigmoid colon to bladder and therefore drain left in place.     Review of Systems  Constitutional: Negative for chills, fatigue and fever.  Gastrointestinal: Negative for diarrhea.  Skin: Negative for rash.       Objective:   Physical Exam Eyes:     General: No scleral icterus. Cardiovascular:     Rate and Rhythm: Normal rate and regular rhythm.  Neurological:     General: No focal deficit present.     Mental Status: She is alert.  Psychiatric:        Mood and Affect: Mood normal.   SH: she continues to reside at a rehab facility        Assessment & Plan:

## 2020-09-12 NOTE — Assessment & Plan Note (Signed)
She has complete resolution of the abscess with drainage and antibiotic therapy.  I discussed the findings with her and will stop the antibiotics.   She will call if she develops new concerns in regards to infection, otherwise can rtc as needed

## 2020-09-12 NOTE — Assessment & Plan Note (Signed)
Fistula noted on the recent CT drain study and she has follow up arranged with Dr. Johney Maine.

## 2020-09-12 NOTE — Assessment & Plan Note (Signed)
picc line in place and no longer needed.  I have referred the patient back to IR for removal.

## 2020-09-16 ENCOUNTER — Other Ambulatory Visit: Payer: Self-pay

## 2020-09-16 ENCOUNTER — Ambulatory Visit (HOSPITAL_COMMUNITY)
Admission: RE | Admit: 2020-09-16 | Discharge: 2020-09-16 | Disposition: A | Discharge status: Home / Self Care | Payer: Medicare Other | Source: Ambulatory Visit | Attending: Internal Medicine | Admitting: Internal Medicine

## 2020-09-16 DIAGNOSIS — Z452 Encounter for adjustment and management of vascular access device: Secondary | ICD-10-CM | POA: Diagnosis present

## 2020-09-16 DIAGNOSIS — B999 Unspecified infectious disease: Secondary | ICD-10-CM | POA: Diagnosis not present

## 2020-09-16 HISTORY — PX: IR REMOVAL TUN CV CATH W/O FL: IMG2289

## 2020-09-16 MED ORDER — LIDOCAINE HCL 1 % IJ SOLN
INTRAMUSCULAR | Status: AC
  Filled 2020-09-16: qty 20

## 2020-09-16 NOTE — Progress Notes (Signed)
RIJ tunneled CVC removed per request. Patient tolerated procedure well.   Valerie [name], [place] [phone] 09/16/2020, 2:33 PM

## 2020-10-07 ENCOUNTER — Encounter: Payer: Self-pay | Admitting: Surgery

## 2020-10-07 DIAGNOSIS — I82B29 Chronic embolism and thrombosis of unspecified subclavian vein: Secondary | ICD-10-CM | POA: Insufficient documentation

## 2020-10-07 DIAGNOSIS — I82B21 Chronic embolism and thrombosis of right subclavian vein: Secondary | ICD-10-CM | POA: Insufficient documentation

## 2020-10-09 ENCOUNTER — Other Ambulatory Visit: Payer: Self-pay | Admitting: Surgery

## 2020-10-09 ENCOUNTER — Other Ambulatory Visit (HOSPITAL_COMMUNITY): Payer: Self-pay | Admitting: Surgery

## 2020-10-09 DIAGNOSIS — E210 Primary hyperparathyroidism: Secondary | ICD-10-CM

## 2020-10-10 ENCOUNTER — Ambulatory Visit
Admission: RE | Admit: 2020-10-10 | Discharge: 2020-10-10 | Disposition: A | Discharge status: Home / Self Care | Payer: Medicare Other | Source: Ambulatory Visit | Attending: Surgery | Admitting: Surgery

## 2020-10-10 ENCOUNTER — Encounter: Payer: Self-pay | Admitting: Radiology

## 2020-10-10 DIAGNOSIS — N321 Vesicointestinal fistula: Secondary | ICD-10-CM

## 2020-10-10 HISTORY — PX: IR RADIOLOGIST EVAL & MGMT: IMG5224

## 2020-10-10 NOTE — Progress Notes (Addendum)
Referring Physician(s): Gross,Steven  Chief Complaint: The patient is seen in follow up today s/p  LLQ abscess drain placed 08/17/20 by Dr. Vernard Gambles.   History of present illness: She is status post robotic low anterior rectosigmoid resection with primary colovesical fistula repair, mobilization of splenic flexure, omental pedical flab with omentopexy of bladder repair 07/17/20 by Dr. 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  Developed pain; weakness; lethargy Work up revealing CT evidence of abscess adjacent to bladder and sigmoid colon. IR placed drain 08/17/20  IR follow up 09/10/20 :    CT shows resolution of drained left pelvic abscess. Drain injection under fluoro demonstrates fistula from drain site to adjacent sigmoid colon and colovesical fistula from sigmoid colon to bladder.    Now comes back for injection again Denies pain; N/V No fever/chills OP mostly just urine-- sometimes more cloudy than others  Has appt with Dr Johney Maine Monday 10/14/20   Past Medical History:  Diagnosis Date  . Anemia   . Anxiety   . Arthritis    Psoriatic  . Bipolar disorder ([place])   . CKD (chronic kidney disease), stage III ([place])   . Colovesical fistula   . Depression   . DM type 2 (diabetes mellitus, type 2) ([place]) 11/14/2019  . Dyspnea   . Headache   . Neuropathy   . NSTEMI (non-ST elevated myocardial infarction) ([place]) 11/14/2019  . OSA (obstructive sleep apnea)   . Psoriatic arthritis ([place])   . Sleep apnea   . 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 Stroke [facility])    Memory issues, March 2021    Past Surgical History:  Procedure Laterality Date  . APPENDECTOMY    . CHOLECYSTECTOMY    . CYSTOSCOPY WITH STENT PLACEMENT N/A 07/17/2020   Procedure: CYSTOSCOPY WITH BILATERAL FIREFLY INJECTION;  Surgeon: Raynelle Bring, MD;  Location: [facility];  Service: Urology;  Laterality: N/A;  . FLEXIBLE SIGMOIDOSCOPY N/A 05/17/2020   Procedure: FLEXIBLE  SIGMOIDOSCOPY;  Surgeon: Carol Ada, MD;  Location: [facility] END[facility];  Service: Endoscopy;  Laterality: N/A;  . FOOT SURGERY Right   . IR FLUORO GUIDE CV LINE RIGHT  08/19/2020  . IR RADIOLOGIST EVAL & MGMT  09/10/2020  . IR RADIOLOGIST EVAL & MGMT  10/10/2020  . IR REMOVAL TUN CV CATH W/O FL  09/16/2020  . IR US GUIDE VASC ACCESS RIGHT  08/19/2020  . LEFT HEART CATH AND CORONARY ANGIOGRAPHY N/A 11/14/2019   Procedure: LEFT HEART CATH AND CORONARY ANGIOGRAPHY;  Surgeon: Martinique, Peter M, MD;  Location: [place] CV LAB;  Service: Cardiovascular;  Laterality: N/A;  . PROCTOSCOPY N/A 07/17/2020   Procedure: RIGID PROCTOSCOPY;  Surgeon: Michael Boston, MD;  Location: [facility];  Service: General;  Laterality: N/A;  . TONSILLECTOMY    . TOTAL HIP ARTHROPLASTY Bilateral     Allergies: Carbamazepine, Lyrica [pregabalin], and Sertraline hcl  Medications: Prior to Admission medications   Medication Sig Start Date End Date Taking? Authorizing Provider  ALPRAZolam Duanne Moron) 1 MG tablet Take 1 tablet (1 mg total) by mouth 2 (two) times daily. 08/21/20   Eugenie Filler, MD  Ascorbic Acid (VITAMIN C) 1000 MG tablet Take 1,000 mg by mouth at bedtime.    [provider]  aspirin EC 81 MG tablet Take 1 tablet (81 mg total) by mouth daily. 11/14/19 11/13/20  Samuella Cota, MD  b complex vitamins tablet Take 2 tablets by mouth at bedtime.    [provider]  Cholecalciferol (VITAMIN D-3)  1000 units CAPS Take 1,000 Units by mouth daily.     [provider]  clopidogrel (PLAVIX) 75 MG tablet Take 75 mg by mouth daily.    [provider]  docusate sodium (COLACE) 100 MG capsule Take 100 mg by mouth at bedtime.    [provider]  donepezil (ARICEPT) 10 MG tablet Take 10 mg by mouth daily. 10/30/19   [provider]  escitalopram (LEXAPRO) 20 MG tablet Take 30 mg by mouth daily.  09/10/15   [provider]  ferrous sulfate 325 (65 FE) MG tablet Take 1  tablet (325 mg total) by mouth daily with breakfast. 08/22/20   Eugenie Filler, MD  furosemide (LASIX) 20 MG tablet Take 1 tablet (20 mg total) by mouth daily. 07/23/20   Michael Boston, MD  HYDROcodone-acetaminophen (NORCO) 10-325 MG tablet Take 0.5-1 tablets by mouth every 6 (six) hours as needed for moderate pain or severe pain. 08/21/20   Eugenie Filler, MD  lamoTRIgine (LAMICTAL) 150 MG tablet Take 150 mg by mouth 2 (two) times daily.  02/27/18   [provider]  Multiple Vitamins-Iron (MULTIVITAMINS WITH IRON) TABS tablet Take 1 tablet by mouth daily. 08/22/20   Eugenie Filler, MD  nadolol (CORGARD) 20 MG tablet Take 0.5 tablets (10 mg total) by mouth daily. 12/04/19   [name], MD  nitroGLYCERIN (NITROSTAT) 0.4 MG SL tablet Place 1 tablet (0.4 mg total) under the tongue every 5 (five) minutes as needed for chest pain. 11/14/19   Samuella Cota, MD  omeprazole (PRILOSEC) 20 MG capsule Take 1 capsule (20 mg total) by mouth 2 (two) times daily before a meal. 06/12/20 10/10/20  Barb Merino, MD  ondansetron (ZOFRAN) 8 MG tablet Take 8 mg by mouth 2 (two) times daily as needed for nausea or vomiting.  05/02/20   [provider]  phenazopyridine (PYRIDIUM) 100 MG tablet Take 1 tablet (100 mg total) by mouth 3 (three) times daily as needed (bladder spasm). 08/21/20   Eugenie Filler, MD  piperacillin-tazobactam (ZOSYN) IVPB Inject 12.5 g into the vein daily. As continuous infusion Indication:  Abdominal abscess First Dose: Yes Last Day of Therapy:  09/16/20 Labs - Once weekly:  CBC/D and BMP, Labs - Every other week:  ESR and CRP Method of administration: Elastomeric (Continuous infusion) Method of administration may be changed at the discretion of home infusion pharmacist based upon assessment of the patient and/or caregiver's ability to self-administer the medication ordered. 08/21/20   Eugenie Filler, MD  polyethylene glycol (MIRALAX / GLYCOLAX) 17 g  packet Take 17 g by mouth daily. 08/22/20   Eugenie Filler, MD  predniSONE (DELTASONE) 10 MG tablet Take 5-10 mg by mouth See admin instructions. Take 5 mg by mouth every other day, alternating with 10 mg on opposing days    [provider]  rizatriptan (MAXALT-MLT) 10 MG disintegrating tablet Take 10 mg by mouth See admin instructions. Take one tablet (10 mg) by mouth daily as needed for migraine. May repeat in 2 hours if still needed    [provider]  rosuvastatin (CRESTOR) 5 MG tablet Take 1 tablet (5 mg total) by mouth daily. 03/21/20 07/03/20  [name], MD  saccharomyces boulardii (FLORASTOR) 250 MG capsule Take 1 capsule (250 mg total) by mouth 2 (two) times daily. 08/21/20   Eugenie Filler, MD  traZODone (DESYREL) 100 MG tablet Take 2 tablets (200 mg total) by mouth at bedtime. 07/23/20  Earnstine Regal, PA-C     Family History  Problem Relation Age of Onset  . Breast cancer Neg Hx     Social History   Socioeconomic History  . Marital status: Divorced    Spouse name: Not on file  . Number of children: 2  . Years of education: Not on file  . Highest education level: Not on file  Occupational History  . Occupation: Disabled  Tobacco Use  . Smoking status: Current Every Day Smoker    Packs/day: 0.50  . Smokeless tobacco: Never Used  Vaping Use  . Vaping Use: Never used  Substance and Sexual Activity  . Alcohol use: Not Currently  . Drug use: Never  . Sexual activity: Not on file  Other Topics Concern  . Not on file  Social History Narrative  . Not on file   Social Determinants of Health   Financial Resource Strain: Not on file  Food Insecurity: Not on file  Transportation Needs: Not on file  Physical Activity: Not on file  Stress: Not on file  Social Connections: Not on file     Vital Signs: BP (!) 150/63   Pulse (!) 59   Temp 98.3 F (36.8 C)   SpO2 96%   Physical Exam Skin:    General: Skin is warm.     Comments:  Site is clean and dry No bleeding Suture not intact But no evidence of drain tubing retraction  Injection reveals persistent fistula per Dr Laurence Ferrari     Imaging: IR Radiologist Eval & Mgmt  Result Date: 10/10/2020 Please refer to notes tab for details about interventional procedure. (Op Note)   Labs:  CBC: Recent Labs    08/11/20 0438 08/15/20 0535 08/16/20 0608 08/19/20 0408  WBC 8.7 9.5 6.9 7.8  HGB 8.7* 8.3* 8.6* 8.1*  HCT 28.6* 26.7* 27.9* 27.8*  PLT 201 183 194 215    COAGS: Recent Labs    08/16/20 0608  INR 1.1    BMP: Recent Labs    11/14/19 0002 02/20/20 1138 05/14/20 1514 05/15/20 0210 06/03/20 1339 08/15/20 0535 08/16/20 0608 08/18/20 0433 08/19/20 0408  NA 143 137 136 135   < > 140 142 146* 143  K 5.1 4.0 4.1 3.7   < > 3.3* 3.6 3.4* 4.0  CL 108 103 95* 102   < > 108 111 113* 112*  CO2 '30 23 30 27   ' < > 21* '24 24 25  ' GLUCOSE 127* 113* 106* 87   < > 93 90 84 92  BUN 26* '18 16 15   ' < > '11 13 14 11  ' CALCIUM 10.2 9.8 12.9* 11.2*   < > 8.3* 8.2* 7.9* 7.9*  CREATININE 1.62* 1.73* 1.86* 1.67*   < > 1.32* 1.41* 1.48* 1.36*  GFRNONAA 34* 32* 29* 33*   < > 46* 43* 40* 45*  GFRAA 40* 37* 33* 38*  --   --   --   --   --    < > = values in this interval not displayed.    LIVER FUNCTION TESTS: Recent Labs    07/19/20 0906 08/09/20 1312 08/10/20 0652 08/11/20 0438 08/12/20 0428 08/13/20 0320 08/14/20 0350 08/19/20 0408  BILITOT 0.9 0.5 0.5  --   --   --   --  0.4  AST '17 23 15  ' --   --   --   --  13*  ALT '14 22 16  ' --   --   --   --  13  ALKPHOS 65 83 69  --   --   --   --  49  PROT 6.1* 6.6 5.3*  --   --   --   --  4.9*  ALBUMIN 2.8* 3.3* 2.4*   < > 2.5* 2.5* 2.5* 2.6*   < > = values in this interval not displayed.    Assessment:  Colovesical fistula remains per injection today OP is urine Suture NOT intact- but no tubing retraction identified New StatLock placed To see Dr Trish Mage 2/21 If plan is for surgery-- could continue  with just use of statlock- if surgery soon (less than 2 weeks) If plan is for surgery--- but not til much later-- would need drain replacement with new suture placement. Pt is aware-- please call our office with plan We can set pt up for drain replacement anytime at her convenience She is agreeable  Signed: Lavonia Drafts, PA-C 10/10/2020, 2:04 PM   Please refer to Dr. Laurence Ferrari attestation of this note for management and plan.

## 2020-10-16 ENCOUNTER — Other Ambulatory Visit (HOSPITAL_COMMUNITY): Payer: Self-pay | Admitting: Surgery

## 2020-10-16 DIAGNOSIS — N321 Vesicointestinal fistula: Secondary | ICD-10-CM

## 2020-10-21 ENCOUNTER — Telehealth (HOSPITAL_COMMUNITY): Payer: Self-pay

## 2020-10-21 NOTE — Telephone Encounter (Signed)
-----   Message from [facility] sent at 10/21/2020  8:47 AM EST ----- Regarding: RE: Drain in cavity w fistula  ----- Message ----- From: Criselda Peaches, MD Sent: 10/21/2020   8:17 AM EST To: Marcelyn Bruins, Michael Boston, MD, # Subject: RE: Drain in cavity w fistula                  Richardson Landry,   Excellent, thank you for the update.  We'll start doing drain exchanges on her every 8-10 weeks to maintain the drain.  We'll check each time to see if the fistula to bladder persists or heals.   Vickie - please schedule Mrs. Rabelo for drain change and downsize to 40F .  Thanks,  Myrle Sheng   ----- Message ----- From: Michael Boston, MD Sent: 10/14/2020   3:29 PM EST To: Monia Sabal, PA-C, Criselda Peaches, MD, # Subject: Drain in cavity w fistula                      I saw Ms. Degrave today.  She is the immunosuppressed morbidly obese diabetic patient who had a horrible colovesical fistula that underwent robotic low anterior rectosigmoid resection with bladder repair and omental patching of the bladder repair, complicated by postop abscess that has turned into a chronic cavity fistulous to colon and bladder.  Last drain study showing connection to: Resolved.  Now with fistula to bladder only.  The plan is to avoid operating on her for at least 6 month, if not forever.  The source of her problems, diverticulitis, is gone.  The fistula is no longer connected to the colon.  Albumin level is finally normal.  Her performance status is improved.  This is the best I have seen her.  She does not seem to have a high volume fistula to the bladder.  Would recommend we keep the drain in longer.  Therefore,  your last note noted about doing a drain exchange.  Do not know if you are planning to downsize since it is mainly urine at this point but I would proceed with that.  Neither Dr. Abner Greenspan with urology nor myself recommend removing the drain just yet until the fistula closes or its been 6 months before  rethinking it.     Your humble and sometimes obedient colleague,  Fatima Sanger, MD, FACS, MASCRS Gastrointestinal and Minimally Invasive Surgery [facility] [address] [place], [place] [address] [phone] Fax [phone] Main/Paging  CONTACT INFORMATION: Weekday (9AM-5PM) concerns: Call [facility] main office at [phone] Weeknight (5PM-9AM) or Weekend/Holiday concerns: Check [email or web address] for General Surgery [facility] coverage (Please, do not use SecureChat as it is not reliable communication to operating surgeons for immediate patient care)  ?

## 2020-10-24 ENCOUNTER — Other Ambulatory Visit: Payer: Self-pay

## 2020-10-24 ENCOUNTER — Ambulatory Visit (HOSPITAL_COMMUNITY)
Admission: RE | Admit: 2020-10-24 | Discharge: 2020-10-24 | Disposition: A | Discharge status: Home / Self Care | Payer: Medicare Other | Source: Ambulatory Visit | Attending: Surgery | Admitting: Surgery

## 2020-10-24 DIAGNOSIS — Z4803 Encounter for change or removal of drains: Secondary | ICD-10-CM | POA: Diagnosis present

## 2020-10-24 DIAGNOSIS — N321 Vesicointestinal fistula: Secondary | ICD-10-CM | POA: Diagnosis not present

## 2020-10-24 HISTORY — PX: IR SINUS/FIST TUBE CHK-NON GI: IMG673

## 2020-10-24 MED ORDER — IOHEXOL 300 MG/ML  SOLN: 50.0000 mL | Freq: Once | INTRAMUSCULAR | Status: DC | PRN

## 2020-10-24 MED ORDER — LIDOCAINE-EPINEPHRINE 1 %-1:100000 IJ SOLN
INTRAMUSCULAR | Status: AC
  Filled 2020-10-24: qty 1

## 2020-10-24 MED ORDER — LIDOCAINE HCL (PF) 1 % IJ SOLN
INTRAMUSCULAR | Status: AC | PRN
  Administered 2020-10-24: 10 mL

## 2020-10-24 NOTE — Procedures (Signed)
Interventional Radiology Procedure Note  Procedure:  1) Sinogram 2) Abdominal drain exchange  Findings: Please refer to procedural dictation for full description.  Indwelling drain in good position with persistent vesicular fistula.  Drain downsized from 12 Fr multipurpose to 10 Fr Dawson-Mueller (smaller pigtail, no 8 Fr drains in stock).    Complications: None immediate  Estimated Blood Loss: None  Recommendations: Keep to bag drainage. Return in 10 weeks for repeat sinogram and further downsize to 8.5 Fr Dawson-Mueller (department is ordering to have in stock).   Ruthann Cancer, MD Pager: [phone]

## 2020-10-29 ENCOUNTER — Other Ambulatory Visit (HOSPITAL_COMMUNITY): Payer: Self-pay | Admitting: Interventional Radiology

## 2020-10-29 ENCOUNTER — Other Ambulatory Visit (HOSPITAL_COMMUNITY): Payer: Self-pay | Admitting: Surgery

## 2020-10-29 ENCOUNTER — Telehealth (HOSPITAL_COMMUNITY): Payer: Self-pay

## 2020-10-29 DIAGNOSIS — N321 Vesicointestinal fistula: Secondary | ICD-10-CM

## 2020-10-29 NOTE — Telephone Encounter (Signed)
Called to schedule drain exchange, no answer, left vm. AW 

## 2020-10-30 ENCOUNTER — Other Ambulatory Visit (HOSPITAL_COMMUNITY): Payer: Self-pay | Admitting: Internal Medicine

## 2020-10-30 DIAGNOSIS — L988 Other specified disorders of the skin and subcutaneous tissue: Secondary | ICD-10-CM

## 2020-10-31 ENCOUNTER — Other Ambulatory Visit: Payer: Self-pay

## 2020-10-31 ENCOUNTER — Ambulatory Visit (HOSPITAL_COMMUNITY)
Admission: RE | Admit: 2020-10-31 | Discharge: 2020-10-31 | Disposition: A | Discharge status: Home / Self Care | Payer: Medicare Other | Source: Ambulatory Visit | Attending: Internal Medicine | Admitting: Internal Medicine

## 2020-10-31 ENCOUNTER — Other Ambulatory Visit (HOSPITAL_COMMUNITY): Payer: Self-pay | Admitting: Internal Medicine

## 2020-10-31 DIAGNOSIS — L988 Other specified disorders of the skin and subcutaneous tissue: Secondary | ICD-10-CM | POA: Insufficient documentation

## 2020-10-31 HISTORY — PX: IR SINUS/FIST TUBE CHK-NON GI: IMG673

## 2020-10-31 MED ORDER — IOHEXOL 300 MG/ML  SOLN
50.0000 mL | Freq: Once | INTRAMUSCULAR | Status: AC | PRN
  Administered 2020-10-31: 15 mL

## 2020-11-06 ENCOUNTER — Telehealth: Payer: Self-pay | Admitting: Internal Medicine

## 2020-11-06 NOTE — Telephone Encounter (Signed)
Patient states she had a lipd panel and CBC done yesterday or Tuesday at [facility]. She would like to know if those are visible for Dr. [name] to see and if not how she can send them to the office.

## 2020-11-06 NOTE — Telephone Encounter (Signed)
Please see MyChart message encounter- labs are attached.

## 2020-11-06 NOTE — Telephone Encounter (Signed)
Spoke with the patient. She stated that she had lab work completed with Dr. Dimas Millin who is in [place]. She has been advised that the lab results are not listed yet.. She can have them faxed if needed.

## 2020-11-22 ENCOUNTER — Other Ambulatory Visit: Payer: Self-pay

## 2020-11-22 ENCOUNTER — Encounter (HOSPITAL_COMMUNITY)
Admission: RE | Admit: 2020-11-22 | Discharge: 2020-11-22 | Disposition: A | Discharge status: Home / Self Care | Payer: Medicare Other | Source: Ambulatory Visit | Attending: Surgery | Admitting: Surgery

## 2020-11-22 DIAGNOSIS — E210 Primary hyperparathyroidism: Secondary | ICD-10-CM | POA: Diagnosis present

## 2020-11-22 MED ORDER — TECHNETIUM TC 99M SESTAMIBI GENERIC - CARDIOLITE
24.3000 | Freq: Once | INTRAVENOUS | Status: AC | PRN
  Administered 2020-11-22: 24.3 via INTRAVENOUS

## 2020-11-28 NOTE — Addendum Note (Signed)
Encounter addended by: Betsey Holiday on: 11/28/2020 10:29 AM  Actions taken: Imaging Exam ended

## 2020-11-29 ENCOUNTER — Other Ambulatory Visit: Payer: Self-pay | Admitting: Surgery

## 2020-11-29 DIAGNOSIS — E210 Primary hyperparathyroidism: Secondary | ICD-10-CM

## 2020-12-03 ENCOUNTER — Encounter: Payer: Self-pay | Admitting: Internal Medicine

## 2020-12-03 ENCOUNTER — Ambulatory Visit: Payer: Medicare Other | Admitting: Internal Medicine

## 2020-12-03 ENCOUNTER — Other Ambulatory Visit: Payer: Self-pay

## 2020-12-03 VITALS — BP 128/88 | HR 80 | Ht 62.0 in | Wt 197.0 lb

## 2020-12-03 DIAGNOSIS — E042 Nontoxic multinodular goiter: Secondary | ICD-10-CM | POA: Diagnosis not present

## 2020-12-03 DIAGNOSIS — E213 Hyperparathyroidism, unspecified: Secondary | ICD-10-CM | POA: Diagnosis not present

## 2020-12-03 NOTE — Progress Notes (Signed)
Patient ID: Valerie [name], female   DOB: 1960-12-16, 60 y.o.   MRN: [id number]   This visit occurred during the SARS-CoV-2 public health emergency.  Safety protocols were in place, including screening questions prior to the visit, additional usage of staff PPE, and extensive cleaning of exam room while observing appropriate contact time as indicated for disinfecting solutions.   HPI  Valerie [name] is a 60 y.o.-year-old female, initially referred by Dr. Erlinda Hong, returning for follow-up for hypercalcemia/hyperparathyroidism.  She was also recently found to have thyroid nodules.  Last visit 3 months ago.  Interim history: Since our last visit, patient saw Dr. Harlow Asa we checked a parathyroid scan.  This was not clearly positive but it did show a possible parathyroid adenoma (see below). She has significant fatigue, also joint pains and mm aches.  Reviewed and addended history: At our first visit, patient presented after hospital admission for metabolic encephalopathy in the setting of hypercalcemia after having colovesical fistula repair in 06/2020. She has another fistula >> will have repair Sx in 09/2019. Before her first Sx, she was on TPN for 6 weeks.  Calcium levels were elevated in 10/2019 when she had an AMI and a stroke.  During her recent admission, calcium level was found to be as high as 12.8.  The PTH was not suppressed, at 73.   While in the hospital, she was given Zolendronic acid x1, calcitonin, and hydrated and her calcium returned to normal and then decreased to even the lower limit of normal.  Of note, calcitriol level, vitamin D, TSH, ACE, PTHrp, 24-hour urine calcium and spot Ca/Cr ratio were all normal.  Phosphorus was low (lowest 1.0).   I reviewed pt's pertinent labs: Lab Results  Component Value Date   PTH 73 (H) 08/09/2020   PTH Comment 08/09/2020   CALCIUM 7.9 (L) 08/19/2020   CALCIUM 7.9 (L) 08/18/2020   CALCIUM 8.2 (L) 08/16/2020   CALCIUM 8.3 (L) 08/15/2020   CALCIUM  8.9 08/14/2020   CALCIUM 9.5 08/13/2020   CALCIUM 10.3 08/12/2020   CALCIUM 11.7 (H) 08/11/2020   CALCIUM 11.9 (H) 08/10/2020   CALCIUM 12.0 (H) 08/09/2020   CALCIUM 12.8 (H) 08/09/2020   CALCIUM 11.4 (H) 07/21/2020   CALCIUM 11.4 (H) 07/20/2020   CALCIUM 11.7 (H) 07/19/2020   CALCIUM 11.6 (H) 07/19/2020   CALCIUM 10.7 (H) 07/18/2020   CALCIUM 11.3 (H) 07/17/2020   CALCIUM 10.1 07/08/2020   CALCIUM 9.3 06/13/2020   CALCIUM 9.8 06/10/2020   CALCIUM 10.1 06/09/2020   CALCIUM 10.4 (H) 06/08/2020   CALCIUM 10.7 (H) 06/07/2020   CALCIUM 10.5 (H) 06/06/2020   CALCIUM 10.8 (H) 06/05/2020   CALCIUM 11.5 (H) 06/03/2020   CALCIUM 11.2 (H) 05/15/2020   CALCIUM 12.9 (H) 05/14/2020   CALCIUM 9.8 02/20/2020   CALCIUM 10.2 11/14/2019   CALCIUM 10.4 (H) 11/13/2019   Lab Results  Component Value Date   PHOS 3.0 08/18/2020   PHOS 2.2 (L) 08/16/2020   PHOS 1.9 (L) 08/15/2020   PHOS 1.8 (L) 08/14/2020   PHOS 1.0 (LL) 08/13/2020   PHOS 1.1 (L) 08/12/2020   PHOS 2.3 (L) 08/11/2020   PHOS 2.7 08/09/2020   PHOS 1.9 (L) 07/21/2020   PHOS 3.1 07/18/2020   Component     Latest Ref Rng & Units 08/16/2020  Magnesium     1.7 - 2.4 mg/dL 2.3   Component     Latest Ref Rng & Units 08/09/2020 08/10/2020 08/11/2020 08/14/2020  Calcium, Ur     Not  Estab. mg/dL  6.0 6.2   Calcium/Creat.Ratio     29 - 442 mg/g creat  154    Creatinine, Urine     Not Estab. mg/dL  39.0    Calcium, 24 hour urine     0 - 320 mg/24 hr   143   Vit D, 1,25-Dihydroxy     19.9 - 79.3 pg/mL 33.2     PTH-related peptide     pmol/L   <2.0   Angiotensin-Converting Enzyme     14 - 82 U/L    41   FE Ca = 0.02  A thyroid ultrasound (01/05/2020) showed 4 nodules, but none of them reported as possible parathyroid adenomas: THYROID ULTRASOUND  INDICATION: Nontoxic single thyroid nodule thyroid nodules seen on carotid ultrasound performed at doctor's office  COMPARISON: None  ISTHMUS:  - Size: 0.4 cm.   RIGHT LOBE:   - Size: 3.6 x 1.2 x 1.3 cm.  - Echogenicity: Normal.   LEFT LOBE:  - Size: 3.9 x 2.4 x 2.7 cm.  - Echogenicity: Normal.   NODULES:   - Nodule 1:  -- Size: 0.7 x 0.6 x 0.7 cm (long x AP x trans).  -- Location: Right mid.  -- Composition: spongiform (0)  -- Echogenicity: hypoechoic (2)  -- Shape: wider-than-tall (0)  -- Margins: ill-defined (0)  -- Echogenic foci: none (0)  -- ACR TI-RADS total points and risk category: 2 Points - TR2.   - Nodule 2:  -- Size: 2.4 x 1.6 x 2 cm (long x AP x trans).  -- Location: Left mid.  -- Composition: solid or almost completely solid (2)  -- Echogenicity: hypoechoic (2)  -- Shape: wider-than-tall (0)  -- Margins: ill-defined (0)  -- Echogenic foci: none (0)  -- ACR TI-RADS total points and risk category: 4 Points - TR4.    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 Bethesda System Category II  - Nodule 3:  -- Size: 0.6 x 0.4 x 0.6 cm (long x AP x trans).  -- Location: Left lower.  -- Composition: cystic or almost completely cystic (0)   - Nodule 4:  -- Size: 0.8 x 0.6 x 0.7 cm (long x AP x trans).  -- Location: Left lower.  -- Composition: solid or almost completely solid (2)  -- Echogenicity: isoechoic (1)  -- Shape: wider-than-tall (0)  -- Margins: smooth (0)  -- Echogenic foci: none (0)  -- ACR TI-RADS total points and risk category: 3 Points - TR3.   Technetium sestamibi scan (11/23/2020): Washout of radiotracer activity from the thyroid gland at 2 hours. Small focus faint activity inferior to the LEFT lobe of thyroid gland is noted on planar imaging.  SPECT imaging of the neck and chest demonstrate a small focus activity which is faintly above the residual thyroid activit localizing to the lower pole of the LEFT lobe of thyroid gland.  Inferior to the LEFT lower pole activity, there  is a second small focus of activity which is very faint.  IMPRESSION: 1. No convincing evidence parathyroid adenoma adenoma. 2. Focal activity in the lower pole of the LEFT lobe of thyroid gland and small focus inferior to LEFT lobe of thyroid gland would be most likely location of parathyroid adenomas. Consider CT of the neck with contrast for further evaluation.  She is on B complex.  No previous DXA scan reports available for review.  No h/o OP.  No fractures or falls.   + h/o kidney stone x1 "years ago".  +  CKD. Last BUN/Cr: Lab Results  Component Value Date   BUN 11 08/19/2020   BUN 14 08/18/2020   CREATININE 1.36 (H) 08/19/2020   CREATININE 1.48 (H) 08/18/2020   Pt is not on HCTZ.  No h/o vitamin D deficiency. Reviewed vit D levels: Lab Results  Component Value Date   VD25OH 68.85 08/09/2020   Pt is not on calcium but takes vitamin D 1000 units daily.  Pt does not have a FH of hypercalcemia, pituitary tumors, thyroid cancer, or osteoporosis.   Pt. also has a history of CAD, history of NSTEMI.   She was dx'ed with scurvy several years ago.  Also has a history of peripheral neuropathy, psoriatic arthritis, prediabetes, hyperlipidemia.  She is on B12. She sees neurology for PN.  Tried Lyrica and Neurontin but this did not work well for her in the past.  ROS: Constitutional: + weight loss, no fatigue, no subjective hyperthermia, + nocturia Eyes: + Blurry vision, no xerophthalmia ENT: no sore throat, no nodules palpated in throat, no dysphagia/odynophagia, no hoarseness Cardiovascular: no CP/SOB/palpitations/no leg swelling Respiratory: no cough/SOB Gastrointestinal: no N/V/D/C Musculoskeletal: no muscle/+ joint aches Skin: no rashes Neurological: no tremors/+numbness/+tingling/dizziness, + burning in feet.  Past Medical History:  Diagnosis Date  . Anemia   . Anxiety   . Arthritis    Psoriatic  . Bipolar disorder ([place])   . CKD (chronic kidney disease),  stage III ([place])   . Colovesical fistula   . Depression   . DM type 2 (diabetes mellitus, type 2) ([place]) 11/14/2019  . Dyspnea   . Headache   . Neuropathy   . NSTEMI (non-ST elevated myocardial infarction) ([place]) 11/14/2019  . OSA (obstructive sleep apnea)   . Psoriatic arthritis ([place])   . Sleep apnea   . 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 Stroke [facility])    Memory issues, March 2021   Past Surgical History:  Procedure Laterality Date  . APPENDECTOMY    . CHOLECYSTECTOMY    . CYSTOSCOPY WITH STENT PLACEMENT N/A 07/17/2020   Procedure: CYSTOSCOPY WITH BILATERAL FIREFLY INJECTION;  Surgeon: Raynelle Bring, MD;  Location: [facility];  Service: Urology;  Laterality: N/A;  . FLEXIBLE SIGMOIDOSCOPY N/A 05/17/2020   Procedure: FLEXIBLE SIGMOIDOSCOPY;  Surgeon: Carol Ada, MD;  Location: [facility];  Service: Endoscopy;  Laterality: N/A;  . FOOT SURGERY Right   . IR FLUORO GUIDE CV LINE RIGHT  08/19/2020  . IR RADIOLOGIST EVAL & MGMT  09/10/2020  . IR RADIOLOGIST EVAL & MGMT  10/10/2020  . IR REMOVAL TUN CV CATH W/O FL  09/16/2020  . IR SINUS/FIST TUBE CHK-NON GI  10/24/2020  . IR SINUS/FIST TUBE CHK-NON GI  10/31/2020  . IR US GUIDE VASC ACCESS RIGHT  08/19/2020  . LEFT HEART CATH AND CORONARY ANGIOGRAPHY N/A 11/14/2019   Procedure: LEFT HEART CATH AND CORONARY ANGIOGRAPHY;  Surgeon: Martinique, Peter M, MD;  Location: [facility] CV LAB;  Service: Cardiovascular;  Laterality: N/A;  . PROCTOSCOPY N/A 07/17/2020   Procedure: RIGID PROCTOSCOPY;  Surgeon: Michael [place], MD;  Location: [facility];  Service: General;  Laterality: N/A;  . TONSILLECTOMY    . TOTAL HIP ARTHROPLASTY Bilateral    Social History   Socioeconomic History  . Marital status: Divorced    Spouse name: Not on file  . Number of children: 2  . Years of education: Not on  file  . Highest education level: Not on file  Occupational History  . Occupation: Disabled  Tobacco Use  . Smoking status: Current Every Day Smoker     Packs/day: 0.50  . Smokeless tobacco: Never Used  Vaping Use  . Vaping Use: Never used  Substance and Sexual Activity  . Alcohol use: Not Currently  . Drug use: Never  . Sexual activity: Not on file  Other Topics Concern  . Not on file  Social History Narrative  . Not on file   Social Determinants of Health   Financial Resource Strain: Not on file  Food Insecurity: Not on file  Transportation Needs: Not on file  Physical Activity: Not on file  Stress: Not on file  Social Connections: Not on file  Intimate Partner Violence: Not on file   Current Outpatient Medications on File Prior to Visit  Medication Sig Dispense Refill  . ALPRAZolam (XANAX) 1 MG tablet Take 1 tablet (1 mg total) by mouth 2 (two) times daily. 20 tablet 0  . Ascorbic Acid (VITAMIN C) 1000 MG tablet Take 1,000 mg by mouth at bedtime.    Marland Kitchen b complex vitamins tablet Take 2 tablets by mouth at bedtime.    . Cholecalciferol (VITAMIN D-3) 1000 units CAPS Take 1,000 Units by mouth daily.     . clopidogrel (PLAVIX) 75 MG tablet Take 75 mg by mouth daily.    Marland Kitchen docusate sodium (COLACE) 100 MG capsule Take 100 mg by mouth at bedtime.    . donepezil (ARICEPT) 10 MG tablet Take 10 mg by mouth daily.    Marland Kitchen escitalopram (LEXAPRO) 20 MG tablet Take 30 mg by mouth daily.   0  . ferrous sulfate 325 (65 FE) MG tablet Take 1 tablet (325 mg total) by mouth daily with breakfast.  3  . furosemide (LASIX) 20 MG tablet Take 1 tablet (20 mg total) by mouth daily. 30 tablet 1  . HYDROcodone-acetaminophen (NORCO) 10-325 MG tablet Take 0.5-1 tablets by mouth every 6 (six) hours as needed for moderate pain or severe pain. 20 tablet 0  . lamoTRIgine (LAMICTAL) 150 MG tablet Take 150 mg by mouth 2 (two) times daily.   0  . Multiple Vitamins-Iron (MULTIVITAMINS WITH IRON) TABS tablet Take 1 tablet by mouth daily.  0  . nadolol (CORGARD) 20 MG tablet Take 0.5 tablets (10 mg total) by mouth daily. 90 tablet 1  . nitroGLYCERIN (NITROSTAT) 0.4 MG  SL tablet Place 1 tablet (0.4 mg total) under the tongue every 5 (five) minutes as needed for chest pain. 30 tablet 0  . omeprazole (PRILOSEC) 20 MG capsule Take 1 capsule (20 mg total) by mouth 2 (two) times daily before a meal. 60 capsule 3  . ondansetron (ZOFRAN) 8 MG tablet Take 8 mg by mouth 2 (two) times daily as needed for nausea or vomiting.     . phenazopyridine (PYRIDIUM) 100 MG tablet Take 1 tablet (100 mg total) by mouth 3 (three) times daily as needed (bladder spasm). 10 tablet 0  . piperacillin-tazobactam (ZOSYN) IVPB Inject 12.5 g into the vein daily. As continuous infusion Indication:  Abdominal abscess First Dose: Yes Last Day of Therapy:  09/16/20 Labs - Once weekly:  CBC/D and BMP, Labs - Every other week:  ESR and CRP Method of administration: Elastomeric (Continuous infusion) Method of administration may be changed at the discretion of home infusion pharmacist based upon assessment of the patient and/or caregiver's ability to self-administer the medication ordered. 28 Units 0  .  polyethylene glycol (MIRALAX / GLYCOLAX) 17 g packet Take 17 g by mouth daily. 14 each 0  . predniSONE (DELTASONE) 10 MG tablet Take 5-10 mg by mouth See admin instructions. Take 5 mg by mouth every other day, alternating with 10 mg on opposing days    . rizatriptan (MAXALT-MLT) 10 MG disintegrating tablet Take 10 mg by mouth See admin instructions. Take one tablet (10 mg) by mouth daily as needed for migraine. May repeat in 2 hours if still needed    . rosuvastatin (CRESTOR) 5 MG tablet Take 1 tablet (5 mg total) by mouth daily. 90 tablet 3  . saccharomyces boulardii (FLORASTOR) 250 MG capsule Take 1 capsule (250 mg total) by mouth 2 (two) times daily.    . traZODone (DESYREL) 100 MG tablet Take 2 tablets (200 mg total) by mouth at bedtime. 60 tablet 0   No current facility-administered medications on file prior to visit.   Allergies  Allergen Reactions  . Carbamazepine Other (See Comments)     Parkinsons like symptoms tremors  . Lyrica [Pregabalin] Other (See Comments)    Sedation Other reaction(s): sedated  . Sertraline Hcl Other (See Comments)    Unknown reaction   Family History  Problem Relation Age of Onset  . Breast cancer Neg Hx     PE: BP 128/88 (BP Location: Right Arm, Patient Position: Sitting, Cuff Size: Normal)   Pulse 80   Ht '5\' 2"'  (1.575 m)   Wt 197 lb (89.4 kg)   SpO2 95%   BMI 36.03 kg/m  Wt Readings from Last 3 Encounters:  12/03/20 197 lb (89.4 kg)  09/11/20 202 lb 3.2 oz (91.7 kg)  08/26/20 199 lb 12.8 oz (90.6 kg)   Constitutional: overweight, in NAD. No kyphosis.  Walks with a walker Eyes: PERRLA, EOMI, no exophthalmos ENT: moist mucous membranes, no thyromegaly, no nodules palpated, no cervical lymphadenopathy Cardiovascular: RRR, No MRG, + B periankle Edema Respiratory: CTA B Gastrointestinal: abdomen soft, NT, ND, BS+ Musculoskeletal: no deformities, strength intact in all 4 Skin: moist, warm, no rashes Neurological: no tremor with outstretched hands, DTR normal in all 4  Assessment: 1. Hypercalcemia/hyperparathyroidism  2. Thyroid nodules  Plan: Patient with several instances of elevated calcium, the highest level being 12.8-12.9.  Corresponding intact PTH level was also high, at 72.  She also has a history of vitamin D deficiency, but latest level was normal in 07/2020, at 68.8. -She has no apparent complications from hypercalcemia: No history of nephrolithiasis, osteoporosis, or fractures.  Also, no abdominal pain, depression, bone pain. -At last visit, I discussed with patient and her daughter about the physiology of calcium and parathyroid hormone and possible side effects from increased PTH including kidney stones, osteoporosis as long-term complications. -She did have comprehensive evaluation performed before our first visit, with normal vitamin D and calcitriol level, low phosphorus, normal 24-hour urine calcium (FE calcium  0.02%), normal PTHrp, SPEP/UPEP, ACE.  She does have a history of CKD, but this is mild, and would not explain her significantly elevated calcium.  However, the very high calcium levels were obtained after a period when she was on TPN, which may have influenced the calcium levels. -At last visit, we proceeded with a referral to surgery. -She does meet surgical criteria (shown in bold below): Marland Kitchen Increased calcium by more than 1 mg/dL above the upper limit of normal  . Kidney ds.  . Osteoporosis (or vertebral fracture) . Age <35 years old Newer criteria (2013): Marland Kitchen High UCa >400  mg/d and increased stone risk by biochemical stone risk analysis . Presence of nephrolithiasis or nephrocalcinosis . Pt's preference!  -She saw Dr. Harlow Asa, who obtained a technetium sestamibi scan (11/23/2020) and this did not show a convincing parathyroid lesion.  However, there was the possibility of the left inferior adenoma. -I communicated with Dr. Harlow Asa, who ordered a 4D CT as the next step-this is scheduled on 12/11/2020. -At today's visit, I will repeat the calcium and PTH level, along with a vitamin D level. -I will see her back in 4 months  2. Thyroid nodules -Patient with history of 3 subcentimeter and 1 supra centimeters thyroid nodule, of which the largest nodule was biopsied with benign results in 2021 -No neck compression symptoms -Latest TSH was normal -We will repeat another thyroid ultrasound at next visit  Component     Latest Ref Rng & Units 12/03/2020          Calcium     8.7 - 10.3 mg/dL 10.0  PTH, Intact     15 - 65 pg/mL 113 (H)  PTH Interp      Comment  Vitamin D, 25-Hydroxy     30.0 - 100.0 ng/mL 50.6  Vitamin D is normal.  Calcium normal, but PTH elevated.  Will forward these to Dr. Harlow Asa.  Philemon Kingdom, MD PhD [facility] Endocrinology

## 2020-12-03 NOTE — Patient Instructions (Addendum)
Please stop at the lab.  Please come back in 4 months.

## 2020-12-04 LAB — PTH, INTACT AND CALCIUM
Calcium: 10 mg/dL (ref 8.7–10.3)
PTH: 113 pg/mL — ABNORMAL HIGH (ref 15–65)

## 2020-12-04 LAB — VITAMIN D 25 HYDROXY (VIT D DEFICIENCY, FRACTURES): Vit D, 25-Hydroxy: 50.6 ng/mL (ref 30.0–100.0)

## 2020-12-11 ENCOUNTER — Ambulatory Visit
Admission: RE | Admit: 2020-12-11 | Discharge: 2020-12-11 | Disposition: A | Discharge status: Home / Self Care | Payer: Medicare Other | Source: Ambulatory Visit | Attending: Surgery | Admitting: Surgery

## 2020-12-11 ENCOUNTER — Other Ambulatory Visit: Payer: Self-pay

## 2020-12-11 DIAGNOSIS — E210 Primary hyperparathyroidism: Secondary | ICD-10-CM

## 2020-12-11 MED ORDER — IOPAMIDOL (ISOVUE-300) INJECTION 61%
60.0000 mL | Freq: Once | INTRAVENOUS | Status: AC | PRN
  Administered 2020-12-11: 60 mL via INTRAVENOUS

## 2020-12-12 ENCOUNTER — Other Ambulatory Visit: Payer: Self-pay | Admitting: Surgery

## 2020-12-12 DIAGNOSIS — E210 Primary hyperparathyroidism: Secondary | ICD-10-CM

## 2020-12-16 ENCOUNTER — Ambulatory Visit (HOSPITAL_COMMUNITY): Payer: Medicare Other

## 2020-12-17 ENCOUNTER — Other Ambulatory Visit: Payer: Self-pay

## 2020-12-17 ENCOUNTER — Ambulatory Visit
Admission: RE | Admit: 2020-12-17 | Discharge: 2020-12-17 | Disposition: A | Discharge status: Home / Self Care | Payer: Medicare Other | Source: Ambulatory Visit | Attending: Surgery | Admitting: Surgery

## 2020-12-17 DIAGNOSIS — E210 Primary hyperparathyroidism: Secondary | ICD-10-CM

## 2020-12-17 MED ORDER — IOPAMIDOL (ISOVUE-300) INJECTION 61%
60.0000 mL | Freq: Once | INTRAVENOUS | Status: AC | PRN
  Administered 2020-12-17: 60 mL via INTRAVENOUS

## 2020-12-24 ENCOUNTER — Ambulatory Visit: Payer: Self-pay | Admitting: Surgery

## 2020-12-25 ENCOUNTER — Telehealth: Payer: Self-pay

## 2020-12-25 NOTE — Telephone Encounter (Signed)
   Name: Karianna Gusman  DOB: 09/14/1960  MRN: [id number]  Primary Cardiologist: [name], MD  Chart reviewed as part of pre-operative protocol coverage. Because of Verma Flagler's past medical history and time since last visit, she will require a follow-up visit in order to better assess preoperative cardiovascular risk.  She has an appt with Dr. [name] on 01/10/21. Since she has been on and off plavix and has a history of CAD, I will defer clearance and plavix hold to Dr. [name].   Pre-op covering staff: - Please schedule appointment and call patient to inform them. If patient already had an upcoming appointment within acceptable timeframe, please add "pre-op clearance" to the appointment notes so provider is aware. - Please contact requesting surgeon's office via preferred method (i.e, phone, fax) to inform them of need for appointment prior to surgery.  If applicable, this message will also be routed to pharmacy pool and/or primary cardiologist for input on holding anticoagulant/antiplatelet agent as requested below so that this information is available to the clearing provider at time of patient's appointment.   Tami Lin Santhosh Gulino, PA  12/25/2020, 2:15 PM

## 2020-12-25 NOTE — Telephone Encounter (Signed)
   [facility] Pre-operative Risk Assessment    Patient Name: Valerie [name]   Request for surgical clearance:  1. What type of surgery is being performed? PARATHYROIDECTOMY   2. When is this surgery scheduled? TBD   3. What type of clearance is required (medical clearance vs. Pharmacy clearance to hold med vs. Both)? BOTH  4. Are there any medications that need to be held prior to surgery and how long? PLAVIX 5DAYS   5. Practice name and name of physician performing surgery? [facility] DR Harlow Asa ATTN:KELLY   6. What is the office phone number? [phone]   7.   What is the office fax number? [phone]  8.   Anesthesia type (None, local, MAC, general) ? GENERAL

## 2020-12-25 NOTE — Telephone Encounter (Signed)
Pt has appt 01/10/21 with Dr. [name]. Will add pre op clearance to appt notes. Will forward clearance note to Md for upcoming appt . Will send FYI to requesting office pt has appt 01/10/21.

## 2020-12-29 ENCOUNTER — Encounter: Payer: Self-pay | Admitting: Surgery

## 2020-12-29 NOTE — H&P (Signed)
General Surgery [facility]  Valerie [name] DOB: 1961/05/24 Single / Language: [name] / Race: White Female   History of Present Illness   The patient is a 60 year old female who presents with primary hyperparathyroidism.  CHIEF COMPLAINT: primary hyperparathyroidism  Patient is referred by Dr. Philemon Kingdom for surgical evaluation and management of suspected primary hyperparathyroidism. Patient's primary care physician is Dr. Otelia Santee. Patient has a complex past medical and surgical history. She has recently been under the care of my partner, Dr. Neysa Bonito, for colovesical fistula. Patient had recently been noted to have hypercalcemia. She developed significant hypercalcemia requiring medical management and intravenous infusional therapy. Recent levels have been more controlled with a calcium level ranging from 12.0-12.8. Her most recent PTH level is elevated at 73. 25-hydroxy vitamin D level is normal at 68. 24-hour urine collection for calcium is normal at 143. Patient has not had any significant complications from hypercalcemia. There is no history of osteoporosis. There is no history of nephrolithiasis. She has had no recent fractures. Patient did undergo an ultrasound in the spring of 2021 for an unrelated issue. She was noted to have thyroid nodules. She underwent fine-needle aspiration biopsy with benign cytopathology results. No parathyroid adenoma was identified on the ultrasound study. Patient has not had further imaging. She presents today accompanied by her daughter for evaluation.   Allergies TEGretol *ANTICONVULSANTS*  Sertraline HCl *ANTIDEPRESSANTS*  carBAMazepine *ANTICONVULSANTS*  Allergies Reconciled   Medication History  ALPRAZolam (1MG  Tablet, Oral) Active. Cefdinir (300MG  Capsule, Oral) Active. Donepezil HCl (10MG  Tablet, Oral) Active. Escitalopram Oxalate (20MG  Tablet, Oral) Active. HYDROcodone-Acetaminophen  (10-325MG  Tablet, Oral) Active. lamoTRIgine (150MG  Tablet, Oral) Active. metroNIDAZOLE (500MG  Tablet, Oral) Active. Nadolol (20MG  Tablet, Oral) Active. Nitroglycerin (0.4MG  Tab Sublingual, Sublingual) Active. Ondansetron HCl (8MG  Tablet, Oral) Active. predniSONE (10MG  Tablet, Oral) Active. Rizatriptan Benzoate (10MG  Tablet, Oral) Active. Rosuvastatin Calcium (5MG  Tablet, Oral) Active. traZODone HCl (100MG  Tablet, Oral) Active. Medications Reconciled  Physical Exam  GENERAL APPEARANCE Development: normal Nutritional status: normal Gross deformities: none  SKIN Rash, lesions, ulcers: none Induration, erythema: none Nodules: none palpable  EYES Conjunctiva and lids: normal Pupils: equal and reactive Iris: normal bilaterally  EARS, NOSE, MOUTH, THROAT External ears: no lesion or deformity External nose: no lesion or deformity Hearing: grossly normal Due to Covid-19 pandemic, patient is wearing a mask.  NECK Symmetric: yes Trachea: midline Thyroid: no palpable nodules in the thyroid bed  CHEST Respiratory effort: normal Retraction or accessory muscle use: no Breath sounds: normal bilaterally Rales, rhonchi, wheeze: none  CARDIOVASCULAR Auscultation: regular rhythm, normal rate Murmurs: none Pulses: radial pulse 2+ palpable Lower extremity edema: none  MUSCULOSKELETAL Station and gait: normal Digits and nails: no clubbing or cyanosis Muscle strength: grossly normal all extremities Range of motion: grossly normal all extremities Deformity: none  LYMPHATIC Cervical: none palpable Supraclavicular: none palpable  PSYCHIATRIC Oriented to person, place, and time: yes Mood and affect: normal for situation Judgment and insight: appropriate for situation    Assessment & Plan   PRIMARY HYPERPARATHYROIDISM (E21.0)  Follow Up - Call [facility] office after tests / studies doneto discuss further plans  Patient is referred by endocrinology for surgical  evaluation and management of suspected primary hyperparathyroidism.  Patient provided with a copy of "Parathyroid Surgery: Treatment for Your Parathyroid Gland Problem", published by [employer], 12 pages. Book reviewed and explained to patient during visit today.  Patient has biochemical evidence of hyperparathyroidism. Calcium level has been significantly elevated. Intact PTH level has  not been suppressed. Ultrasound examination of the neck did not identify an enlarged parathyroid gland. I would like to obtain a nuclear medicine parathyroid scan in hopes of identifying a parathyroid adenoma and confirming her diagnosis. We discussed the fact that this study is approximately 80% successful. If it identifies an adenoma and confirms her diagnosis, then I believe she will be a good candidate for minimally invasive surgery. I discussed the procedure. We discussed this being done as an outpatient procedure. Discussed the size and location of the surgical incision. We discussed her postoperative recovery. If the nuclear medicine parathyroid scan fails to identify an adenoma, then we will proceed with a 4D CT scan of the neck in hopes of localizing the adenoma. The patient and her daughter understand this plan and agreed to proceed.  Patient will undergo nuclear medicine parathyroid scanning. We will contact her with the results of that study when they are available.  ADDENDUM  Telephone call to patient with results of 4D-CT scan - positive for left inferior adenoma.  Plan minimally invasive parathyroidectomy as out-patient procedure. Discussed possibility of 4-gland exploration with overnight stay.  Patient will need to stop Plavix 5 days prior to procedure.  The risks and benefits of the procedure have been discussed at length with the patient. The patient understands the proposed procedure, potential alternative treatments, and the course of recovery to be expected. All of the patient's  questions have been answered at this time. The patient wishes to proceed with surgery.  Armandina Gemma, MD [facility] Office: [phone]

## 2020-12-31 NOTE — Progress Notes (Addendum)
COVID Vaccine Completed: Yes x1 Date COVID Vaccine completed:  06-2020 Has received booster: COVID vaccine manufacturer:     Moderna  Date of COVID positive in last 90 days:  N/A  PCP - Otelia Santee, MD Cardiologist - Cherlynn Kaiser, MD  Chest x-ray - 08-09-20 Epic EKG - 07-19-20 Epic Stress Test -  ECHO - 11-14-19 Epic Cardiac Cath - 11-14-19 Epic Pacemaker/ICD device last checked: Spinal Cord Stimulator:  Sleep Study - 20 years ago, +sleep apnea CPAP - Yes  Fasting Blood Sugar - No Checks Blood Sugar _____ times a day  Blood Thinner Instructions: Plavix 75 mg.   Last dose 01-03-21 Aspirin Instructions: Last Dose:  Activity level:  Can go up a flight of stairs and perform activities of daily living without stopping and without symptoms of chest pain or shortness of breath.      Anesthesia review: CAD, hx of MI, HTN, OSA, DM, CKD.  Hx of CVA, only deficits are  balance and memory issues  Patient denies shortness of breath, fever, cough and chest pain at PAT appointment   Patient verbalized understanding of instructions that were given to them at the PAT appointment. Patient was also instructed that they will need to review over the PAT instructions again at home before surgery.

## 2020-12-31 NOTE — Patient Instructions (Addendum)
DUE TO COVID-19 ONLY ONE VISITOR IS ALLOWED TO COME WITH YOU AND STAY IN THE WAITING ROOM ONLY DURING PRE OP AND PROCEDURE.   **NO VISITORS ARE ALLOWED IN THE SHORT STAY AREA OR RECOVERY ROOM!!**         Your procedure is scheduled on: Thursday, 01-09-21   Report to [facility] Main  Entrance   Report to admitting at 7:15 AM   Call this number if you have problems the morning of surgery [phone]   Do not eat food :After Midnight.   May have liquids until 6:15 AM day of surgery  CLEAR LIQUID DIET  Foods Allowed                                                                     Foods Excluded  Water, Black Coffee and tea, regular and decaf              liquids that you cannot  Plain Jell-O in any flavor  (No red)                                    see through such as: Fruit ices (not with fruit pulp)                                      milk, soups, orange juice              Iced Popsicles (No red)                                      All solid food                                   Apple juices Sports drinks like Gatorade (No red) Lightly seasoned clear broth or consume(fat free) Sugar, honey syrup    Oral Hygiene is also important to reduce your risk of infection.                                    Remember - BRUSH YOUR TEETH THE MORNING OF SURGERY WITH YOUR REGULAR TOOTHPASTE   Do NOT smoke after Midnight   Take these medicines the morning of surgery with A SIP OF WATER:  Alprazolam, Donepezil, Escitalopram, LamoTrigine, Nadolol,  Omeprazole, Rosuvastatin  DO NOT TAKE ANY ORAL DIABETIC MEDICATIONS DAY OF YOUR SURGERY                               You may not have any metal on your body including hair pins, jewelry, and body piercings             Do not wear make-up, lotions, powders, perfumes/cologne, or deodorant             Do not wear nail polish.  Do not shave  48 hours prior to surgery.               Do not bring valuables to the hospital. [place].   Contacts, dentures or bridgework may not be worn into surgery.   Bring CPAP mask and tubing day of surgery   Patients discharged the day of surgery will not be allowed to drive home.                Please read over the following fact sheets you were given: IF YOU HAVE QUESTIONS ABOUT YOUR PRE OP INSTRUCTIONS PLEASE CALL  [facility] - Preparing for Surgery Before surgery, you can play an important role.  Because skin is not sterile, your skin needs to be as free of germs as possible.  You can reduce the number of germs on your skin by washing with CHG (chlorahexidine gluconate) soap before surgery.  CHG is an antiseptic cleaner which kills germs and bonds with the skin to continue killing germs even after washing. Please DO NOT use if you have an allergy to CHG or antibacterial soaps.  If your skin becomes reddened/irritated stop using the CHG and inform your nurse when you arrive at Short Stay. Do not shave (including legs and underarms) for at least 48 hours prior to the first CHG shower.  You may shave your face/neck.  Please follow these instructions carefully:  1.  Shower with CHG Soap the night before surgery and the  morning of surgery.  2.  If you choose to wash your hair, wash your hair first as usual with your normal  shampoo.  3.  After you shampoo, rinse your hair and body thoroughly to remove the shampoo.                             4.  Use CHG as you would any other liquid soap.  You can apply chg directly to the skin and wash.  Gently with a scrungie or clean washcloth.  5.  Apply the CHG Soap to your body ONLY FROM THE NECK DOWN.   Do   not use on face/ open                           Wound or open sores. Avoid contact with eyes, ears mouth and   genitals (private parts).                       Wash face,  Genitals (private parts) with your normal soap.             6.  Wash thoroughly, paying special attention to the  area where your    surgery  will be performed.  7.  Thoroughly rinse your body with warm water from the neck down.  8.  DO NOT shower/wash with your normal soap after using and rinsing off the CHG Soap.                9.  Pat yourself dry with a clean towel.            10.  Wear clean pajamas.            11.  Place clean sheets on your bed the night of your first shower and do not  sleep with pets. Day of Surgery : Do not apply any lotions/deodorants the morning of surgery.  Please wear clean clothes to the hospital/surgery center.  FAILURE TO FOLLOW THESE INSTRUCTIONS MAY RESULT IN THE CANCELLATION OF YOUR SURGERY  PATIENT SIGNATURE_________________________________  NURSE SIGNATURE__________________________________  ________________________________________________________________________

## 2021-01-04 ENCOUNTER — Encounter (HOSPITAL_COMMUNITY): Payer: Self-pay | Admitting: Surgery

## 2021-01-06 ENCOUNTER — Other Ambulatory Visit (HOSPITAL_COMMUNITY)
Admission: RE | Admit: 2021-01-06 | Discharge: 2021-01-06 | Disposition: A | Discharge status: Home / Self Care | Payer: Medicare Other | Source: Ambulatory Visit | Attending: Surgery | Admitting: Surgery

## 2021-01-06 DIAGNOSIS — Z20822 Contact with and (suspected) exposure to covid-19: Secondary | ICD-10-CM | POA: Insufficient documentation

## 2021-01-06 DIAGNOSIS — Z01812 Encounter for preprocedural laboratory examination: Secondary | ICD-10-CM | POA: Diagnosis present

## 2021-01-07 ENCOUNTER — Encounter (HOSPITAL_COMMUNITY)
Admission: RE | Admit: 2021-01-07 | Discharge: 2021-01-07 | Disposition: A | Discharge status: Home / Self Care | Payer: Medicare Other | Source: Ambulatory Visit | Attending: Surgery | Admitting: Surgery

## 2021-01-07 ENCOUNTER — Other Ambulatory Visit: Payer: Self-pay

## 2021-01-07 ENCOUNTER — Encounter (HOSPITAL_COMMUNITY): Payer: Self-pay

## 2021-01-07 DIAGNOSIS — E0590 Thyrotoxicosis, unspecified without thyrotoxic crisis or storm: Secondary | ICD-10-CM | POA: Diagnosis not present

## 2021-01-07 DIAGNOSIS — Z8673 Personal history of transient ischemic attack (TIA), and cerebral infarction without residual deficits: Secondary | ICD-10-CM | POA: Diagnosis not present

## 2021-01-07 DIAGNOSIS — G4730 Sleep apnea, unspecified: Secondary | ICD-10-CM | POA: Diagnosis not present

## 2021-01-07 DIAGNOSIS — I252 Old myocardial infarction: Secondary | ICD-10-CM | POA: Diagnosis not present

## 2021-01-07 DIAGNOSIS — Z01812 Encounter for preprocedural laboratory examination: Secondary | ICD-10-CM | POA: Insufficient documentation

## 2021-01-07 DIAGNOSIS — Z79899 Other long term (current) drug therapy: Secondary | ICD-10-CM | POA: Insufficient documentation

## 2021-01-07 DIAGNOSIS — Z7982 Long term (current) use of aspirin: Secondary | ICD-10-CM | POA: Insufficient documentation

## 2021-01-07 DIAGNOSIS — N183 Chronic kidney disease, stage 3 unspecified: Secondary | ICD-10-CM | POA: Diagnosis not present

## 2021-01-07 DIAGNOSIS — E1122 Type 2 diabetes mellitus with diabetic chronic kidney disease: Secondary | ICD-10-CM | POA: Diagnosis not present

## 2021-01-07 DIAGNOSIS — I2510 Atherosclerotic heart disease of native coronary artery without angina pectoris: Secondary | ICD-10-CM | POA: Insufficient documentation

## 2021-01-07 DIAGNOSIS — F17200 Nicotine dependence, unspecified, uncomplicated: Secondary | ICD-10-CM | POA: Insufficient documentation

## 2021-01-07 LAB — CBC
HCT: 39 % (ref 36.0–46.0)
Hemoglobin: 11.9 g/dL — ABNORMAL LOW (ref 12.0–15.0)
MCH: 32.3 pg (ref 26.0–34.0)
MCHC: 30.5 g/dL (ref 30.0–36.0)
MCV: 106 fL — ABNORMAL HIGH (ref 80.0–100.0)
Platelets: 137 10*3/uL — ABNORMAL LOW (ref 150–400)
RBC: 3.68 MIL/uL — ABNORMAL LOW (ref 3.87–5.11)
RDW: 14.5 % (ref 11.5–15.5)
WBC: 8.2 10*3/uL (ref 4.0–10.5)
nRBC: 0 % (ref 0.0–0.2)

## 2021-01-07 LAB — BASIC METABOLIC PANEL
Anion gap: 4 — ABNORMAL LOW (ref 5–15)
BUN: 32 mg/dL — ABNORMAL HIGH (ref 8–23)
CO2: 28 mmol/L (ref 22–32)
Calcium: 9.4 mg/dL (ref 8.9–10.3)
Chloride: 109 mmol/L (ref 98–111)
Creatinine, Ser: 1.4 mg/dL — ABNORMAL HIGH (ref 0.44–1.00)
GFR, Estimated: 43 mL/min — ABNORMAL LOW (ref >60)
Glucose, Bld: 107 mg/dL — ABNORMAL HIGH (ref 70–99)
Potassium: 4.4 mmol/L (ref 3.5–5.1)
Sodium: 141 mmol/L (ref 135–145)

## 2021-01-07 LAB — SARS CORONAVIRUS 2 (TAT 6-24 HRS): SARS Coronavirus 2: NEGATIVE

## 2021-01-07 LAB — HEMOGLOBIN A1C
Hgb A1c MFr Bld: 5.5 % (ref 4.8–5.6)
Mean Plasma Glucose: 111.15 mg/dL

## 2021-01-07 LAB — GLUCOSE, CAPILLARY: Glucose-Capillary: 99 mg/dL (ref 70–99)

## 2021-01-08 NOTE — Telephone Encounter (Signed)
Patient called back to say that her surgery is tomorrow and she cant afford to cancel it. Is there any way she can get approval for the procedure. Please advise

## 2021-01-08 NOTE — Telephone Encounter (Signed)
   Primary Cardiologist: [name], MD  Chart reviewed as part of pre-operative protocol coverage.   60 y.o. female with  Coronary artery disease   NSTEMI in 3/21 >> mild non-obs dz on cath - med Rx [facility])  Echocardiogram 3/21: EF 60-65, no sig valve dz  Cath 3/21: pLAD 30, pRCA 25  Pre-Diabetes mellitus   Neuropathy  ?hx of CVA  Hypertension   Chronic kidney disease  OSA  Last OV: Telemed 02/2020; Dr. [name]  Procedure: Parathyroidectomy  Rx: request to hold Plavix for 5 days  RCRI:  Perioperative Risk of Major Cardiac Event is (%): 6.6 (high risk) DASI:  Functional Capacity in METs is: 5.07 (functional status is fair )  Patient was contacted 01/08/2021 in reference to pre-operative risk assessment for pending surgery as outlined below.    Since last seen, Jadesola Poynter has done well without chest pain, shortness of breath.  Recommendations: . Therefore, based on ACC/AHA guidelines, the patient is at acceptable risk for the planned procedure without further cardiovascular testing.  Marland Kitchen Plavix is managed by neurology. . She has remained on ASA without interruption.    Please call with questions. Richardson Dopp, PA-C 01/08/2021, 3:18 PM

## 2021-01-08 NOTE — Telephone Encounter (Signed)
Notes faxed to surgeon. This phone note will be removed from the preop pool. Richardson Dopp, PA-C  01/08/2021 3:20 PM

## 2021-01-08 NOTE — Progress Notes (Signed)
Anesthesia Chart Review   Case: [id number] Date/Time: 01/09/21 0900   Procedures:      LEFT INFERIOR PARATHYROIDECTOMY (Left )     POSSIBLE NECK EXPLORATION (N/A )   Anesthesia type: General   Pre-op diagnosis: PRIMARY HYPERTHYROIDISM   Location[facility] ROOM 05 / [facility]   Surgeons: Armandina Gemma, MD      DISCUSSION:60 y.o. current every day smoker with h/o sleep apnea, DM II, CKD Stage III, CAD h/o NSTEMI, stroke, primary hyperthyroidism scheduled for above procedure 01/09/2021 with Dr. Armandina Gemma.   Cardiac clearance requested. Per cardiology pt needs office visit before giving clearance.  She is currently scheduled for a visit on 01/10/21.   VS: BP 139/78   Pulse 70   Temp 37 C (Oral)   Resp 18   Ht 5\' 2"  (1.575 m)   Wt 92.2 kg   SpO2 100%   BMI 37.17 kg/m   PROVIDERS: Ophelia Shoulder, MD  Is PCP   Cherlynn Kaiser, MD is Cardiologist  LABS: Labs reviewed: Acceptable for surgery. (all labs ordered are listed, but only abnormal results are displayed)  Labs Reviewed  BASIC METABOLIC PANEL - Abnormal; Notable for the following components:      Result Value   Glucose, Bld 107 (*)    BUN 32 (*)    Creatinine, Ser 1.40 (*)    GFR, Estimated 43 (*)    Anion gap 4 (*)    All other components within normal limits  CBC - Abnormal; Notable for the following components:   RBC 3.68 (*)    Hemoglobin 11.9 (*)    MCV 106.0 (*)    Platelets 137 (*)    All other components within normal limits  HEMOGLOBIN A1C  GLUCOSE, CAPILLARY     IMAGES:   EKG: 07/19/2020 Rate 87 bpm  Normal sinus rhythm Inferior infarct , age undetermined Anterolateral infarct , age undetermined Abnormal ECG No significant change was found  CV: Cardiac Cath 11/14/2019  Prox LAD to Mid LAD lesion is 30% stenosed.  Prox RCA lesion is 25% stenosed.  LV end diastolic pressure is moderately elevated.   1. Mild nonobstructive CAD. 2. Moderately elevated LV EDP  Plan: medical management. Assess LV  function by Echo. Potential etiology for chest pain include hypertensive heart disease, stress induced CM, or aortic pathology.   Echo 11/14/2019 1. Left ventricular ejection fraction, by estimation, is 60 to 65%. The  left ventricle has normal function. The left ventricle has no regional  wall motion abnormalities. Left ventricular diastolic parameters were  normal.  2. Right ventricular systolic function is normal. The right ventricular  size is normal. Tricuspid regurgitation signal is inadequate for assessing  PA pressure.  3. The mitral valve is grossly normal. No evidence of mitral valve  regurgitation. No evidence of mitral stenosis.  4. The aortic valve is tricuspid. Aortic valve regurgitation is not  visualized. No aortic stenosis is present.  5. The inferior vena cava is normal in size with greater than 50%  respiratory variability, suggesting right atrial pressure of 3 mmHg. Past Medical History:  Diagnosis Date  . Anemia   . Anxiety   . Arthritis    Psoriatic  . Bipolar disorder ([place])   . CKD (chronic kidney disease), stage III ([place])   . Colovesical fistula   . Depression   . DM type 2 (diabetes mellitus, type 2) ([place]) 11/14/2019  . Dyspnea   . Headache   . Neuropathy   . NSTEMI (  non-ST elevated myocardial infarction) ([place]) 11/14/2019  . OSA (obstructive sleep apnea)   . Psoriatic arthritis ([place])   . Sleep apnea   . 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 Stroke [facility])    Memory issues, March 2021    Past Surgical History:  Procedure Laterality Date  . ABDOMINAL HYSTERECTOMY    . APPENDECTOMY    . CHOLECYSTECTOMY    . CYSTOSCOPY WITH STENT PLACEMENT N/A 07/17/2020   Procedure: CYSTOSCOPY WITH BILATERAL FIREFLY INJECTION;  Surgeon: Raynelle Bring, MD;  Location[facility];  Service: Urology;  Laterality: N/A;  . FLEXIBLE SIGMOIDOSCOPY N/A 05/17/2020   Procedure: FLEXIBLE SIGMOIDOSCOPY;  Surgeon: Carol Ada, MD;  Location[facility];  Service:  Endoscopy;  Laterality: N/A;  . FOOT SURGERY Right   . IR FLUORO GUIDE CV LINE RIGHT  08/19/2020  . IR RADIOLOGIST EVAL & MGMT  09/10/2020  . IR RADIOLOGIST EVAL & MGMT  10/10/2020  . IR REMOVAL TUN CV CATH W/O FL  09/16/2020  . IR SINUS/FIST TUBE CHK-NON GI  10/24/2020  . IR SINUS/FIST TUBE CHK-NON GI  10/31/2020  . IR US GUIDE VASC ACCESS RIGHT  08/19/2020  . LEFT HEART CATH AND CORONARY ANGIOGRAPHY N/A 11/14/2019   Procedure: LEFT HEART CATH AND CORONARY ANGIOGRAPHY;  Surgeon: Martinique, Peter M, MD;  Location: [place] CV LAB;  Service: Cardiovascular;  Laterality: N/A;  . PROCTOSCOPY N/A 07/17/2020   Procedure: RIGID PROCTOSCOPY;  Surgeon: Michael Boston, MD;  Location[facility];  Service: General;  Laterality: N/A;  . TONSILLECTOMY    . TOTAL HIP ARTHROPLASTY Bilateral     MEDICATIONS: . ALPRAZolam (XANAX) 1 MG tablet  . Ascorbic Acid (VITAMIN C) 1000 MG tablet  . aspirin EC 81 MG tablet  . Cholecalciferol (VITAMIN D-3) 1000 units CAPS  . clopidogrel (PLAVIX) 75 MG tablet  . Cyanocobalamin (VITAMIN B-12 PO)  . docusate sodium (COLACE) 100 MG capsule  . donepezil (ARICEPT) 10 MG tablet  . escitalopram (LEXAPRO) 20 MG tablet  . furosemide (LASIX) 20 MG tablet  . HYDROcodone-acetaminophen (NORCO) 10-325 MG tablet  . ibuprofen (ADVIL) 200 MG tablet  . lamoTRIgine (LAMICTAL) 150 MG tablet  . levocetirizine (XYZAL) 5 MG tablet  . Multiple Vitamins-Iron (MULTIVITAMINS WITH IRON) TABS tablet  . nadolol (CORGARD) 20 MG tablet  . nitroGLYCERIN (NITROSTAT) 0.4 MG SL tablet  . omeprazole (PRILOSEC) 20 MG capsule  . oxybutynin (DITROPAN) 5 MG tablet  . phenazopyridine (PYRIDIUM) 100 MG tablet  . polyethylene glycol (MIRALAX / GLYCOLAX) 17 g packet  . predniSONE (DELTASONE) 10 MG tablet  . Probiotic Product (PROBIOTIC DAILY PO)  . rizatriptan (MAXALT-MLT) 10 MG disintegrating tablet  . rosuvastatin (CRESTOR) 5 MG tablet  . saccharomyces boulardii (FLORASTOR) 250 MG capsule  . traZODone  (DESYREL) 100 MG tablet   No current facility-administered medications for this encounter.     [name], PA-C [facility] [phone]

## 2021-01-09 ENCOUNTER — Ambulatory Visit (HOSPITAL_COMMUNITY)
Admission: RE | Admit: 2021-01-09 | Discharge: 2021-01-09 | Disposition: A | Discharge status: Home / Self Care | Payer: Medicare Other | Attending: Surgery | Admitting: Surgery

## 2021-01-09 ENCOUNTER — Ambulatory Visit (HOSPITAL_COMMUNITY): Payer: Medicare Other | Admitting: Physician Assistant

## 2021-01-09 ENCOUNTER — Ambulatory Visit (HOSPITAL_COMMUNITY): Payer: Medicare Other | Admitting: Certified Registered Nurse Anesthetist

## 2021-01-09 ENCOUNTER — Encounter (HOSPITAL_COMMUNITY): Payer: Self-pay | Admitting: Surgery

## 2021-01-09 ENCOUNTER — Encounter (HOSPITAL_COMMUNITY)
Admission: RE | Disposition: A | Discharge status: Home / Self Care | Payer: Self-pay | Source: Home / Self Care | Attending: Surgery

## 2021-01-09 DIAGNOSIS — Z7952 Long term (current) use of systemic steroids: Secondary | ICD-10-CM | POA: Insufficient documentation

## 2021-01-09 DIAGNOSIS — Z888 Allergy status to other drugs, medicaments and biological substances status: Secondary | ICD-10-CM | POA: Insufficient documentation

## 2021-01-09 DIAGNOSIS — D351 Benign neoplasm of parathyroid gland: Secondary | ICD-10-CM | POA: Insufficient documentation

## 2021-01-09 DIAGNOSIS — Z79899 Other long term (current) drug therapy: Secondary | ICD-10-CM | POA: Insufficient documentation

## 2021-01-09 DIAGNOSIS — E213 Hyperparathyroidism, unspecified: Secondary | ICD-10-CM | POA: Diagnosis present

## 2021-01-09 DIAGNOSIS — E210 Primary hyperparathyroidism: Secondary | ICD-10-CM | POA: Insufficient documentation

## 2021-01-09 HISTORY — PX: PARATHYROID EXPLORATION: SHX732

## 2021-01-09 HISTORY — PX: PARATHYROIDECTOMY: SHX19

## 2021-01-09 LAB — GLUCOSE, CAPILLARY
Glucose-Capillary: 115 mg/dL — ABNORMAL HIGH (ref 70–99)
Glucose-Capillary: 100 mg/dL — ABNORMAL HIGH (ref 70–99)

## 2021-01-09 SURGERY — PARATHYROIDECTOMY
Anesthesia: General | Site: Neck

## 2021-01-09 MED ORDER — CEFAZOLIN SODIUM-DEXTROSE 2-4 GM/100ML-% IV SOLN
2.0000 g | INTRAVENOUS | Status: AC
  Administered 2021-01-09: 2 g via INTRAVENOUS
  Filled 2021-01-09: qty 100

## 2021-01-09 MED ORDER — LIDOCAINE 2% (20 MG/ML) 5 ML SYRINGE
INTRAMUSCULAR | Status: AC
  Filled 2021-01-09: qty 5

## 2021-01-09 MED ORDER — DEXAMETHASONE SODIUM PHOSPHATE 4 MG/ML IJ SOLN
INTRAMUSCULAR | Status: DC | PRN
  Administered 2021-01-09: 5 mg via INTRAVENOUS

## 2021-01-09 MED ORDER — BUPIVACAINE HCL (PF) 0.25 % IJ SOLN
INTRAMUSCULAR | Status: AC
  Filled 2021-01-09: qty 30

## 2021-01-09 MED ORDER — PROPOFOL 10 MG/ML IV BOLUS
INTRAVENOUS | Status: DC | PRN
  Administered 2021-01-09: 30 mg via INTRAVENOUS
  Administered 2021-01-09: 140 mg via INTRAVENOUS

## 2021-01-09 MED ORDER — CHLORHEXIDINE GLUCONATE CLOTH 2 % EX PADS: 6.0000 | MEDICATED_PAD | Freq: Once | CUTANEOUS | Status: DC

## 2021-01-09 MED ORDER — FENTANYL CITRATE (PF) 100 MCG/2ML IJ SOLN
INTRAMUSCULAR | Status: AC
  Filled 2021-01-09: qty 2

## 2021-01-09 MED ORDER — ROCURONIUM BROMIDE 10 MG/ML (PF) SYRINGE
PREFILLED_SYRINGE | INTRAVENOUS | Status: AC
  Filled 2021-01-09: qty 10

## 2021-01-09 MED ORDER — EPHEDRINE SULFATE-NACL 50-0.9 MG/10ML-% IV SOSY
PREFILLED_SYRINGE | INTRAVENOUS | Status: DC | PRN
  Administered 2021-01-09: 5 mg via INTRAVENOUS

## 2021-01-09 MED ORDER — MIDAZOLAM HCL 2 MG/2ML IJ SOLN
INTRAMUSCULAR | Status: AC
  Filled 2021-01-09: qty 2

## 2021-01-09 MED ORDER — PROMETHAZINE HCL 25 MG/ML IJ SOLN: 6.2500 mg | INTRAMUSCULAR | Status: DC | PRN

## 2021-01-09 MED ORDER — 0.9 % SODIUM CHLORIDE (POUR BTL) OPTIME
TOPICAL | Status: DC | PRN
  Administered 2021-01-09: 1000 mL

## 2021-01-09 MED ORDER — EPHEDRINE 5 MG/ML INJ
INTRAVENOUS | Status: AC
  Filled 2021-01-09: qty 10

## 2021-01-09 MED ORDER — HEMOSTATIC AGENTS (NO CHARGE) OPTIME
TOPICAL | Status: DC | PRN
  Administered 2021-01-09: 1 via TOPICAL

## 2021-01-09 MED ORDER — FENTANYL CITRATE (PF) 100 MCG/2ML IJ SOLN
INTRAMUSCULAR | Status: DC | PRN
  Administered 2021-01-09 (×4): 50 ug via INTRAVENOUS

## 2021-01-09 MED ORDER — ROCURONIUM BROMIDE 10 MG/ML (PF) SYRINGE
PREFILLED_SYRINGE | INTRAVENOUS | Status: DC | PRN
  Administered 2021-01-09: 50 mg via INTRAVENOUS

## 2021-01-09 MED ORDER — ONDANSETRON HCL 4 MG/2ML IJ SOLN
INTRAMUSCULAR | Status: DC | PRN
  Administered 2021-01-09: 4 mg via INTRAVENOUS

## 2021-01-09 MED ORDER — GLYCOPYRROLATE PF 0.2 MG/ML IJ SOSY
PREFILLED_SYRINGE | INTRAMUSCULAR | Status: AC
  Filled 2021-01-09: qty 1

## 2021-01-09 MED ORDER — BUPIVACAINE HCL 0.25 % IJ SOLN
INTRAMUSCULAR | Status: DC | PRN
  Administered 2021-01-09: 10 mL

## 2021-01-09 MED ORDER — ONDANSETRON HCL 4 MG/2ML IJ SOLN
INTRAMUSCULAR | Status: AC
  Filled 2021-01-09: qty 2

## 2021-01-09 MED ORDER — SUGAMMADEX SODIUM 200 MG/2ML IV SOLN
INTRAVENOUS | Status: DC | PRN
  Administered 2021-01-09: 200 mg via INTRAVENOUS

## 2021-01-09 MED ORDER — CHLORHEXIDINE GLUCONATE 0.12 % MT SOLN
15.0000 mL | Freq: Once | OROMUCOSAL | Status: AC
  Administered 2021-01-09: 15 mL via OROMUCOSAL

## 2021-01-09 MED ORDER — SCOPOLAMINE 1 MG/3DAYS TD PT72
1.0000 | MEDICATED_PATCH | TRANSDERMAL | Status: DC
  Administered 2021-01-09: 1.5 mg via TRANSDERMAL
  Filled 2021-01-09: qty 1

## 2021-01-09 MED ORDER — ORAL CARE MOUTH RINSE: 15.0000 mL | Freq: Once | OROMUCOSAL | Status: AC

## 2021-01-09 MED ORDER — PROPOFOL 10 MG/ML IV BOLUS
INTRAVENOUS | Status: AC
  Filled 2021-01-09: qty 20

## 2021-01-09 MED ORDER — OXYCODONE HCL 5 MG PO TABS
ORAL_TABLET | ORAL | Status: AC
  Filled 2021-01-09: qty 1

## 2021-01-09 MED ORDER — HYDROMORPHONE HCL 1 MG/ML IJ SOLN: 0.2500 mg | INTRAMUSCULAR | Status: DC | PRN

## 2021-01-09 MED ORDER — TRAMADOL HCL 50 MG PO TABS: 50.0000 mg | ORAL_TABLET | Freq: Four times a day (QID) | ORAL | 0 refills | Status: DC | PRN

## 2021-01-09 MED ORDER — PHENYLEPHRINE 40 MCG/ML (10ML) SYRINGE FOR IV PUSH (FOR BLOOD PRESSURE SUPPORT)
PREFILLED_SYRINGE | INTRAVENOUS | Status: DC | PRN
  Administered 2021-01-09 (×2): 80 ug via INTRAVENOUS

## 2021-01-09 MED ORDER — MIDAZOLAM HCL 5 MG/5ML IJ SOLN
INTRAMUSCULAR | Status: DC | PRN
  Administered 2021-01-09: 2 mg via INTRAVENOUS

## 2021-01-09 MED ORDER — OXYCODONE HCL 5 MG/5ML PO SOLN: 5.0000 mg | Freq: Once | ORAL | Status: AC | PRN

## 2021-01-09 MED ORDER — DEXAMETHASONE SODIUM PHOSPHATE 10 MG/ML IJ SOLN
INTRAMUSCULAR | Status: AC
  Filled 2021-01-09: qty 1

## 2021-01-09 MED ORDER — OXYCODONE HCL 5 MG PO TABS
5.0000 mg | ORAL_TABLET | Freq: Once | ORAL | Status: AC | PRN
Start: 2021-01-09 — End: 2021-01-09
  Administered 2021-01-09: 5 mg via ORAL

## 2021-01-09 MED ORDER — ACETAMINOPHEN 500 MG PO TABS
1000.0000 mg | ORAL_TABLET | Freq: Once | ORAL | Status: AC
  Administered 2021-01-09: 1000 mg via ORAL
  Filled 2021-01-09: qty 2

## 2021-01-09 MED ORDER — PHENYLEPHRINE 40 MCG/ML (10ML) SYRINGE FOR IV PUSH (FOR BLOOD PRESSURE SUPPORT)
PREFILLED_SYRINGE | INTRAVENOUS | Status: AC
  Filled 2021-01-09: qty 10

## 2021-01-09 MED ORDER — LIDOCAINE 2% (20 MG/ML) 5 ML SYRINGE
INTRAMUSCULAR | Status: DC | PRN
  Administered 2021-01-09: 100 mg via INTRAVENOUS

## 2021-01-09 MED ORDER — AMISULPRIDE (ANTIEMETIC) 5 MG/2ML IV SOLN: 10.0000 mg | Freq: Once | INTRAVENOUS | Status: DC | PRN

## 2021-01-09 MED ORDER — LACTATED RINGERS IV SOLN: INTRAVENOUS | Status: DC

## 2021-01-09 SURGICAL SUPPLY — 33 items
ATTRACTOMAT 16X20 MAGNETIC DRP (DRAPES) ×3 IMPLANT
BLADE SURG 15 STRL LF DISP TIS (BLADE) ×2 IMPLANT
BLADE SURG 15 STRL SS (BLADE) ×1
CHLORAPREP W/TINT 26 (MISCELLANEOUS) ×3 IMPLANT
CLIP VESOCCLUDE MED 6/CT (CLIP) ×3 IMPLANT
CLIP VESOCCLUDE SM WIDE 6/CT (CLIP) ×3 IMPLANT
COVER SURGICAL LIGHT HANDLE (MISCELLANEOUS) ×3 IMPLANT
COVER WAND RF STERILE (DRAPES) ×3 IMPLANT
DERMABOND ADVANCED (GAUZE/BANDAGES/DRESSINGS) ×1
DERMABOND ADVANCED .7 DNX12 (GAUZE/BANDAGES/DRESSINGS) ×2 IMPLANT
DRAPE LAPAROTOMY T 98X78 PEDS (DRAPES) ×3 IMPLANT
DRAPE UTILITY XL STRL (DRAPES) ×3 IMPLANT
ELECT REM PT RETURN 15FT ADLT (MISCELLANEOUS) ×3 IMPLANT
GAUZE 4X4 16PLY RFD (DISPOSABLE) ×3 IMPLANT
GLOVE SURG ORTHO LTX SZ8 (GLOVE) ×3 IMPLANT
GOWN STRL REUS W/TWL XL LVL3 (GOWN DISPOSABLE) ×9 IMPLANT
HEMOSTAT SURGICEL 2X4 FIBR (HEMOSTASIS) ×3 IMPLANT
ILLUMINATOR WAVEGUIDE N/F (MISCELLANEOUS) IMPLANT
KIT BASIN OR (CUSTOM PROCEDURE TRAY) ×3 IMPLANT
KIT TURNOVER KIT A (KITS) ×3 IMPLANT
NEEDLE HYPO 25X1 1.5 SAFETY (NEEDLE) ×3 IMPLANT
PACK BASIC VI WITH GOWN DISP (CUSTOM PROCEDURE TRAY) ×3 IMPLANT
PENCIL HANDSWITCHING (ELECTRODE) ×3 IMPLANT
PENCIL SMOKE EVACUATOR (MISCELLANEOUS) IMPLANT
SUT MNCRL AB 4-0 PS2 18 (SUTURE) ×3 IMPLANT
SUT SILK 3 0 (SUTURE) ×1
SUT SILK 3-0 18XBRD TIE 12 (SUTURE) ×2 IMPLANT
SUT VIC AB 3-0 SH 18 (SUTURE) ×3 IMPLANT
SYR BULB IRRIG 60ML STRL (SYRINGE) ×3 IMPLANT
SYR CONTROL 10ML LL (SYRINGE) ×3 IMPLANT
TOWEL OR 17X26 10 PK STRL BLUE (TOWEL DISPOSABLE) ×3 IMPLANT
TOWEL OR NON WOVEN STRL DISP B (DISPOSABLE) ×3 IMPLANT
TUBING CONNECTING 10 (TUBING) ×3 IMPLANT

## 2021-01-09 NOTE — Discharge Instructions (Addendum)
[facility]  THYROID & PARATHYROID SURGERY:  POST-OP INSTRUCTIONS  Always review your discharge instruction sheet from the facility where your surgery was performed.  A prescription for pain medication may be given to you upon discharge.  Take your pain medication as prescribed.  If narcotic pain medicine is not needed, then you may take acetaminophen (Tylenol) or ibuprofen (Advil) as needed.  Take your usually prescribed medications unless otherwise directed.  If you need a refill on your pain medication, please contact our office during regular business hours.  Prescriptions cannot be processed by our office after 5 pm or on weekends.  Start with a light diet upon arrival home, such as soup and crackers or toast.  Be sure to drink plenty of fluids daily.  Resume your normal diet the day after surgery.  Most patients will experience some swelling and bruising on the chest and neck area.  Ice packs will help.  Swelling and bruising can take several days to resolve.   It is common to experience some constipation after surgery.  Increasing fluid intake and taking a stool softener (Colace) will usually help or prevent this problem.  A mild laxative (Milk of Magnesia or Miralax) should be taken according to package directions if there has been no bowel movement after 48 hours.  You have steri-strips and a gauze dressing over your incision.  You may remove the gauze bandage on the second day after surgery, and you may shower at that time.  Leave your steri-strips (small skin tapes) in place directly over the incision.  These strips should remain on the skin for 5-7 days and then be removed.  You may get them wet in the shower and pat them dry.  You may resume regular (light) daily activities beginning the next day (such as daily self-care, walking, climbing stairs) gradually increasing activities as tolerated.  You may have sexual intercourse when it is comfortable.  Refrain from  any heavy lifting or straining until approved by your doctor.  You may drive when you no longer are taking prescription pain medication, you can comfortably wear a seatbelt, and you can safely maneuver your car and apply brakes.  You should see your doctor in the office for a follow-up appointment approximately three weeks after your surgery.  Make sure that you call for this appointment within a day or two after you arrive home to insure a convenient appointment time.  WHEN TO CALL YOUR DOCTOR: -- Fever greater than 101.5 -- Inability to urinate -- Nausea and/or vomiting - persistent -- Extreme swelling or bruising -- Continued bleeding from incision -- Increased pain, redness, or drainage from the incision -- Difficulty swallowing or breathing -- Muscle cramping or spasms -- Numbness or tingling in hands or around lips  The clinic staff is available to answer your questions during regular business hours.  Please don't hesitate to call and ask to speak to one of the nurses if you have concerns.  Todd Gerkin, MD [facility] Office: [phone] 

## 2021-01-09 NOTE — Transfer of Care (Signed)
Immediate Anesthesia Transfer of Care Note  [name]  Procedure(s) Performed: LEFT INFERIOR PARATHYROIDECTOMY (Left Neck) POSSIBLE NECK EXPLORATION (N/A )  Patient Location: PACU  Anesthesia Type:General  Level of Consciousness: awake, alert , oriented and patient cooperative  Airway & Oxygen Therapy: Patient Spontanous Breathing and Patient connected to face mask oxygen  Post-op Assessment: Report given to RN and Post -op Vital signs reviewed and stable  Post vital signs: Reviewed and stable  Last Vitals:  Vitals Value Taken Time  BP 152/89 01/09/21 1050  Temp    Pulse 73 01/09/21 1052  Resp 17 01/09/21 1052  SpO2 98 % 01/09/21 1052  Vitals shown include unvalidated device data.  Last Pain:  Vitals:   01/09/21 0911  TempSrc:   PainSc: 0-No pain         Complications: No complications documented.

## 2021-01-09 NOTE — Anesthesia Postprocedure Evaluation (Signed)
Anesthesia Post Note  Patient: [name]  Procedure(s) Performed: LEFT INFERIOR PARATHYROIDECTOMY (Left Neck) POSSIBLE NECK EXPLORATION (N/A )     Patient location during evaluation: PACU Anesthesia Type: General Level of consciousness: awake and alert, oriented and patient cooperative Pain management: pain level controlled Vital Signs Assessment: post-procedure vital signs reviewed and stable Respiratory status: spontaneous breathing, nonlabored ventilation and respiratory function stable Cardiovascular status: blood pressure returned to baseline and stable Postop Assessment: no apparent nausea or vomiting Anesthetic complications: no   No complications documented.  Last Vitals:  Vitals:   01/09/21 1120 01/09/21 1125  BP:    Pulse: 66 67  Resp: 13 (!) 7  Temp:    SpO2: 97% 96%    Last Pain:  Vitals:   01/09/21 1050  TempSrc:   PainSc: 0-No pain                 Pervis Hocking

## 2021-01-09 NOTE — Anesthesia Preprocedure Evaluation (Addendum)
Anesthesia Evaluation  Patient identified by MRN, date of birth, ID band Patient awake    Reviewed: Allergy & Precautions, NPO status , Patient's Chart, lab work & pertinent test results, reviewed documented beta blocker date and time   Airway Mallampati: II  TM Distance: >3 FB Neck ROM: Full    Dental  (+) Teeth Intact, Missing, Dental Advisory Given,    Pulmonary sleep apnea and Continuous Positive Airway Pressure Ventilation , Current Smoker and Patient abstained from smoking.,  1ppd x 50 years No inhalers, has never seen a pulmonologist   Pulmonary exam normal breath sounds clear to auscultation       Cardiovascular (-) hypertension+ CAD and + Past MI  Normal cardiovascular exam Rhythm:Regular Rate:Normal  10/2019: 1. Left ventricular ejection fraction, by estimation, is 60 to 65%. The  left ventricle has normal function. The left ventricle has no regional  wall motion abnormalities. Left ventricular diastolic parameters were  normal.  2. Right ventricular systolic function is normal. The right ventricular  size is normal. Tricuspid regurgitation signal is inadequate for assessing  PA pressure.  3. The mitral valve is grossly normal. No evidence of mitral valve  regurgitation. No evidence of mitral stenosis.  4. The aortic valve is tricuspid. Aortic valve regurgitation is not  visualized. No aortic stenosis is present.  5. The inferior vena cava is normal in size with greater than 50%  respiratory variability, suggesting right atrial pressure of 3 mmHg.    125/90   Neuro/Psych  Headaches, PSYCHIATRIC DISORDERS Anxiety Depression Bipolar Disorder TIA   GI/Hepatic   Endo/Other  neg diabetesObesity BMI 37 Primary hyperthyroidism  a1c 5.5  Renal/GU CRFRenal diseaseCKD 3, Cr 1.4  negative genitourinary   Musculoskeletal  (+) Arthritis , Osteoarthritis,  Chronic steroids   Abdominal (+) + obese,   Peds   Hematology  (+) Blood dyscrasia, anemia , hct 39   Anesthesia Other Findings   Reproductive/Obstetrics negative OB ROS                            Anesthesia Physical Anesthesia Plan  ASA: II  Anesthesia Plan: General   Post-op Pain Management:    Induction: Intravenous  PONV Risk Score and Plan: 3 and Ondansetron, Dexamethasone, Midazolam, Treatment may vary due to age or medical condition and Scopolamine patch - Pre-op  Airway Management Planned: Oral ETT  Additional Equipment: None  Intra-op Plan:   Post-operative Plan: Extubation in OR  Informed Consent: I have reviewed the patients History and Physical, chart, labs and discussed the procedure including the risks, benefits and alternatives for the proposed anesthesia with the patient or authorized representative who has indicated his/her understanding and acceptance.     Dental advisory given  Plan Discussed with: CRNA  Anesthesia Plan Comments:        Anesthesia Quick Evaluation

## 2021-01-09 NOTE — Anesthesia Procedure Notes (Signed)
Procedure Name: Intubation Date/Time: 01/09/2021 9:40 AM Performed by: Claudia Desanctis, CRNA Pre-anesthesia Checklist: Patient identified, Emergency Drugs available, Suction available and Patient being monitored Patient Re-evaluated:Patient Re-evaluated prior to induction Oxygen Delivery Method: Circle system utilized Preoxygenation: Pre-oxygenation with 100% oxygen Induction Type: IV induction Ventilation: Mask ventilation without difficulty Laryngoscope Size: 2 and Miller Grade View: Grade I Tube type: Oral Tube size: 7.0 mm Number of attempts: 1 Airway Equipment and Method: Stylet Placement Confirmation: ETT inserted through vocal cords under direct vision,  positive ETCO2 and breath sounds checked- equal and bilateral Secured at: 21 cm Tube secured with: Tape Dental Injury: Teeth and Oropharynx as per pre-operative assessment

## 2021-01-09 NOTE — Op Note (Signed)
OPERATIVE REPORT - PARATHYROIDECTOMY  Preoperative diagnosis: Primary hyperparathyroidism  Postop diagnosis: Same  Procedure: Left inferior minimally invasive parathyroidectomy (ectopic in thyro-thymic tract)  Surgeon:  Armandina Gemma, MD  Anesthesia: General endotracheal  Estimated blood loss: Minimal  Preparation: ChloraPrep  Indications: Patient is referred by Dr. Philemon Kingdom for surgical evaluation and management of suspected primary hyperparathyroidism. Patient's primary care physician is Dr. Otelia Santee. Patient has a complex past medical and surgical history. She has recently been under the care of my partner, Dr. Neysa Bonito, for colovesical fistula. Patient had recently been noted to have hypercalcemia. She developed significant hypercalcemia requiring medical management and intravenous infusional therapy. Recent levels have been more controlled with a calcium level ranging from 12.0-12.8. Her most recent PTH level is elevated at 73. 25-hydroxy vitamin D level is normal at 68. 24-hour urine collection for calcium is normal at 143. Patient has not had any significant complications from hypercalcemia. There is no history of osteoporosis. There is no history of nephrolithiasis. She has had no recent fractures. Patient did undergo an ultrasound in the spring of 2021 for an unrelated issue. She was noted to have thyroid nodules. She underwent fine-needle aspiration biopsy with benign cytopathology results. No parathyroid adenoma was identified on the ultrasound study. Sestamibi and 4D-CT localized an abnormal gland inferior to the left lobe of the thyroid.  Patient now comes to surgery for exploration and parathyroidectomy.  Procedure: The patient was prepared in the pre-operative holding area. The patient was brought to the operating room and placed in a supine position on the operating room table. Following administration of general anesthesia, the patient was positioned  and then prepped and draped in the usual strict aseptic fashion. After ascertaining that an adequate level of anesthesia been achieved, a neck incision was made with a #15 blade. Dissection was carried through subcutaneous tissues and platysma. Hemostasis was obtained with the electrocautery. Skin flaps were developed circumferentially and a Weitlander retractor was placed for exposure.  Strap muscles were incised in the midline. Strap muscles were reflected laterally exposing the thyroid lobe. With gentle blunt dissection the thyroid lobe was mobilized. There is a smooth, firm nodule in the inferior pole of the left thyroid lobe.  The left neck was explored lateral to the esophagus posteriorly to the pre-cervical fascia.  The left thyro-thymic tract was identified and dissected out.  It was opened and contained an approximately 1 cm nodular mass which was excised. Care was taken to avoid the recurrent laryngeal nerve and the esophagus. It was submitted to pathology where frozen section by Dr. Vicente Males confirmed parathyroid tissue consistent with adenoma.  Neck was irrigated with warm saline and good hemostasis was noted. Fibrillar was placed in the operative field. Strap muscles were approximated in the midline with interrupted 3-0 Vicryl sutures. Platysma was closed with interrupted 3-0 Vicryl sutures. Marcaine was infiltrated circumferentially. Skin was closed with a running 4-0 Monocryl subcuticular suture. Wound was washed and dried and Dermabond was applied. Patient was awakened from anesthesia and brought to the recovery room. The patient tolerated the procedure well.   Armandina Gemma, MD [facility] Office: [phone]

## 2021-01-09 NOTE — Progress Notes (Signed)
Pt in PACU following parathyroidectomy surgery. Oxygen 90s, will desat to 87% but recovers to mid 90s with deep breathing. Pt understands to wear CPAP while napping at home. 1000-1500 incentive spirometer usage. Discussed with Dr. Doroteo Glassman who evaluated pt bedside. Per MD, pt acceptable for d/c

## 2021-01-09 NOTE — Interval H&P Note (Signed)
History and Physical Interval Note:  01/09/2021 9:13 AM  Valerie [name]  has presented today for surgery, with the diagnosis of PRIMARY HYPERTHYROIDISM.  The various methods of treatment have been discussed with the patient and family. After consideration of risks, benefits and other options for treatment, the patient has consented to    Procedure(s): LEFT INFERIOR PARATHYROIDECTOMY (Left) POSSIBLE NECK EXPLORATION (N/A) as a surgical intervention.    The patient's history has been reviewed, patient examined, no change in status, stable for surgery.  I have reviewed the patient's chart and labs.  Questions were answered to the patient's satisfaction.    Armandina Gemma, MD [facility] Office: [place]

## 2021-01-10 ENCOUNTER — Ambulatory Visit (INDEPENDENT_AMBULATORY_CARE_PROVIDER_SITE_OTHER): Payer: Medicare Other | Admitting: Internal Medicine

## 2021-01-10 ENCOUNTER — Other Ambulatory Visit: Payer: Self-pay

## 2021-01-10 ENCOUNTER — Encounter (HOSPITAL_COMMUNITY): Payer: Self-pay | Admitting: Surgery

## 2021-01-10 VITALS — BP 124/76 | HR 73 | Ht 62.0 in | Wt 205.6 lb

## 2021-01-10 DIAGNOSIS — E782 Mixed hyperlipidemia: Secondary | ICD-10-CM | POA: Diagnosis not present

## 2021-01-10 DIAGNOSIS — I10 Essential (primary) hypertension: Secondary | ICD-10-CM

## 2021-01-10 DIAGNOSIS — Z79899 Other long term (current) drug therapy: Secondary | ICD-10-CM | POA: Diagnosis not present

## 2021-01-10 LAB — SURGICAL PATHOLOGY

## 2021-01-10 MED ORDER — ROSUVASTATIN CALCIUM 10 MG PO TABS: 10.0000 mg | ORAL_TABLET | Freq: Every day | ORAL | 3 refills | Status: DC

## 2021-01-10 NOTE — Progress Notes (Signed)
Cardiology Office Note   Date:  01/10/2021   ID:  Valerie [name], DOB 10/03/1960, MRN [id number] The patient was identified using 2 identifiers.  PCP:  Valerie Shoulder, MD  Cardiologist:  Valerie Munroe, MD  Electrophysiologist:  None   Evaluation Performed:  Follow-Up Visit  Chief Complaint:  Follow up nstemi/minoca  History of Present Illness:    Valerie [name] is a 60 y.o. female with diabetes mellitus with peripheral neuropathy, morbid obesity presented with chest pain and found to have elevated troponin. Underwent coronary angiography which demonstrated nonobstructive disease. LVEDP 25 mmHg on cath. NSTEMI secondary to [facility], etiology may include HTN heart disease or small vessel disease in the setting of diabetes.    01/10/2021: Had left inferior minimally invasive parathyroidectomy (ectopic in thyro-thymic tract) on 01/09/21. She is feeling well today.   We discussed her lab results from 10/2020 and 01/07/21. Recently her heart rate has averaged in the 90s after stopping nadolol for hypotension. Today, she is feeling well and states she doesn't even feel like she had a heart attack. Her stroke occurred around the same time as her heart attack. Occasionally she notices minor LE edema around her ankles.  She denies any chest pain, shortness of breath, palpitations, or exertional symptoms. No headaches, lightheadedness, or syncope to report. Also has no orthopnea or PND.  Currently she remains compliant with Crestor 5 mg for cholesterol. She takes it at night because of the risk of muscle spasms but denies any complications. She is still on Plavix per her neurologist, but temporarily discontinued it last Friday due to her scheduled procedure. Very rarely she takes Lasix. She has not needed to take nitroglycerin since her MI.  Her PCP is following her CKD, stage III. Also, she confirms having sleep apnea and regularly uses a CPAP.  Past Medical History:  Diagnosis Date  . Anemia    . Anxiety   . Arthritis    Psoriatic  . Bipolar disorder ([place])   . CKD (chronic kidney disease), stage III ([place])   . Colovesical fistula   . Depression   . DM type 2 (diabetes mellitus, type 2) ([place]) 11/14/2019  . Dyspnea   . Headache   . Neuropathy   . NSTEMI (non-ST elevated myocardial infarction) ([place]) 11/14/2019  . OSA (obstructive sleep apnea)   . Psoriatic arthritis ([place])   . Sleep apnea   . 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 Stroke [facility])    ?history; MRI normal   Past Surgical History:  Procedure Laterality Date  . ABDOMINAL HYSTERECTOMY    . APPENDECTOMY    . CHOLECYSTECTOMY    . CYSTOSCOPY WITH STENT PLACEMENT N/A 07/17/2020   Procedure: CYSTOSCOPY WITH BILATERAL FIREFLY INJECTION;  Surgeon: Raynelle Bring, MD;  Location: [facility];  Service: Urology;  Laterality: N/A;  . FLEXIBLE SIGMOIDOSCOPY N/A 05/17/2020   Procedure: FLEXIBLE SIGMOIDOSCOPY;  Surgeon: Carol Ada, MD;  Location: [facility];  Service: Endoscopy;  Laterality: N/A;  . FOOT SURGERY Right   . IR FLUORO GUIDE CV LINE RIGHT  08/19/2020  . IR RADIOLOGIST EVAL & MGMT  09/10/2020  . IR RADIOLOGIST EVAL & MGMT  10/10/2020  . IR REMOVAL TUN CV CATH W/O FL  09/16/2020  . IR SINUS/FIST TUBE CHK-NON GI  10/24/2020  . IR SINUS/FIST TUBE CHK-NON GI  10/31/2020  . IR US GUIDE VASC ACCESS RIGHT  08/19/2020  . LEFT HEART CATH AND CORONARY ANGIOGRAPHY N/A 11/14/2019   Procedure: LEFT HEART CATH AND  CORONARY ANGIOGRAPHY;  Surgeon: Martinique, Peter M, MD;  Location: [facility];  Service: Cardiovascular;  Laterality: N/A;  . PARATHYROID EXPLORATION N/A 01/09/2021   Procedure: POSSIBLE NECK EXPLORATION;  Surgeon: Armandina Gemma, MD;  Location: [facility];  Service: General;  Laterality: N/A;  . PARATHYROIDECTOMY Left 01/09/2021   Procedure: LEFT INFERIOR PARATHYROIDECTOMY;  Surgeon: Armandina Gemma, MD;  Location: [facility];  Service: General;  Laterality: Left;  . PROCTOSCOPY N/A 07/17/2020   Procedure: RIGID PROCTOSCOPY;   Surgeon: Michael Boston, MD;  Location: [facility];  Service: General;  Laterality: N/A;  . TONSILLECTOMY    . TOTAL HIP ARTHROPLASTY Bilateral      Current Meds  Medication Sig  . ALPRAZolam (XANAX) 1 MG tablet Take 1 tablet (1 mg total) by mouth 2 (two) times daily. (Patient taking differently: Take 1 mg by mouth 2 (two) times daily as needed for anxiety.)  . Ascorbic Acid (VITAMIN C) 1000 MG tablet Take 1,000 mg by mouth at bedtime.  Marland Kitchen aspirin EC 81 MG tablet Take 81 mg by mouth daily. Swallow whole.  . Cholecalciferol (VITAMIN D-3) 1000 units CAPS Take 2,000 Units by mouth daily.  . clopidogrel (PLAVIX) 75 MG tablet Take 75 mg by mouth daily.  . Cyanocobalamin (VITAMIN B-12 PO) Take 1 tablet by mouth daily.  Marland Kitchen docusate sodium (COLACE) 100 MG capsule Take 200 mg by mouth at bedtime.  . donepezil (ARICEPT) 10 MG tablet Take 10 mg by mouth daily.  Marland Kitchen escitalopram (LEXAPRO) 20 MG tablet Take 30 mg by mouth daily.   . furosemide (LASIX) 20 MG tablet Take 1 tablet (20 mg total) by mouth daily. (Patient taking differently: Take 20 mg by mouth daily as needed for fluid or edema.)  . HYDROcodone-acetaminophen (NORCO) 10-325 MG tablet Take 0.5-1 tablets by mouth every 6 (six) hours as needed for moderate pain or severe pain.  Marland Kitchen ibuprofen (ADVIL) 200 MG tablet Take 800 mg by mouth every 6 (six) hours as needed for headache, moderate pain or mild pain.  Marland Kitchen lamoTRIgine (LAMICTAL) 150 MG tablet Take 150 mg by mouth 2 (two) times daily.   Marland Kitchen levocetirizine (XYZAL) 5 MG tablet Take 5 mg by mouth every evening.  . nitroGLYCERIN (NITROSTAT) 0.4 MG SL tablet Place 1 tablet (0.4 mg total) under the tongue every 5 (five) minutes as needed for chest pain.  Marland Kitchen oxybutynin (DITROPAN) 5 MG tablet Take 5 mg by mouth 2 (two) times daily.  . polyethylene glycol (MIRALAX / GLYCOLAX) 17 g packet Take 17 g by mouth daily. (Patient taking differently: Take 17 g by mouth daily as needed for mild constipation or moderate  constipation.)  . predniSONE (DELTASONE) 10 MG tablet Take 5 mg by mouth daily with breakfast. Take 5 mg by mouth every other day, alternating with 10 mg on opposing days  . Probiotic Product (PROBIOTIC DAILY PO) Take 1 tablet by mouth daily.  . rizatriptan (MAXALT-MLT) 10 MG disintegrating tablet Take 10 mg by mouth See admin instructions. Take one tablet (10 mg) by mouth daily as needed for migraine. May repeat in 2 hours if still needed  . traMADol (ULTRAM) 50 MG tablet Take 1-2 tablets (50-100 mg total) by mouth every 6 (six) hours as needed for moderate pain.  . traZODone (DESYREL) 100 MG tablet Take 2 tablets (200 mg total) by mouth at bedtime.     Allergies:   Carbamazepine and Sertraline hcl   Social History   Tobacco Use  . Smoking status: Current Every Day  Smoker    Packs/day: 0.50  . Smokeless tobacco: Never Used  Vaping Use  . Vaping Use: Never used  Substance Use Topics  . Alcohol use: Not Currently  . Drug use: Never     Family Hx: The patient's family history is negative for Breast cancer.  ROS:   Please see the history of present illness.    (+) LE edema All other systems reviewed and are negative.   Prior CV studies:   The following studies were reviewed today:  UE Venous Duplex 06/18/2020: Right:  No evidence of deep vein thrombosis in the upper extremity. No evidence of  superficial vein thrombosis in the upper extremity.    Left:  No evidence of deep vein thrombosis in the upper extremity. No evidence of  superficial vein thrombosis in the upper extremity.   Echo 11/14/2019: 1. Left ventricular ejection fraction, by estimation, is 60 to 65%. The  left ventricle has normal function. The left ventricle has no regional  wall motion abnormalities. Left ventricular diastolic parameters were  normal.  2. Right ventricular systolic function is normal. The right ventricular  size is normal. Tricuspid regurgitation signal is inadequate for assessing  PA  pressure.  3. The mitral valve is grossly normal. No evidence of mitral valve  regurgitation. No evidence of mitral stenosis.  4. The aortic valve is tricuspid. Aortic valve regurgitation is not  visualized. No aortic stenosis is present.  5. The inferior vena cava is normal in size with greater than 50%  respiratory variability, suggesting right atrial pressure of 3 mmHg.   Left Heart Cath 11/14/2019:  Prox LAD to Mid LAD lesion is 30% stenosed.  Prox RCA lesion is 25% stenosed.  LV end diastolic pressure is moderately elevated.   1. Mild nonobstructive CAD. 2. Moderately elevated LV EDP  Plan: medical management. Assess LV function by Echo. Potential etiology for chest pain include hypertensive heart disease, stress induced CM, or aortic pathology.    Labs/Other Tests and Data Reviewed:    EKG:   01/10/2021: NSR, rate 73 bpm, poor R wave progression 02/20/20: NSR, inferior infarct pattern  Recent Labs: 08/09/2020: TSH 1.144 08/16/2020: Magnesium 2.3 08/19/2020: ALT 13 01/07/2021: BUN 32; Creatinine, Ser 1.40; Hemoglobin 11.9; Platelets 137; Potassium 4.4; Sodium 141   Recent Lipid Panel Lab Results  Component Value Date/Time   CHOL 210 (H) 11/14/2019 04:58 PM   TRIG 146 06/10/2020 04:18 AM   HDL 37 (L) 11/14/2019 04:58 PM   CHOLHDL 5.7 11/14/2019 04:58 PM   LDLCALC 133 (H) 11/14/2019 04:58 PM    Wt Readings from Last 3 Encounters:  01/10/21 205 lb 9.6 oz (93.3 kg)  01/09/21 203 lb 4.2 oz (92.2 kg)  01/07/21 203 lb 3.2 oz (92.2 kg)     Objective:    Vital Signs:  BP 124/76   Pulse 73   Ht 5\' 2"  (1.575 m)   Wt 205 lb 9.6 oz (93.3 kg)   SpO2 97%   BMI 37.60 kg/m    Constitutional: No acute distress Eyes: sclera non-icteric, normal conjunctiva and lids ENMT: normal dentition, moist mucous membranes Cardiovascular: regular rhythm, normal rate, no murmurs. S1 and S2 normal. Radial pulses normal bilaterally. No jugular venous distention.  Respiratory: clear  to auscultation bilaterally GI : normal bowel sounds, soft and nontender. No distention.   MSK: extremities warm, well perfused. Trace LE edema at bilateral ankles.  NEURO: grossly nonfocal exam, moves all extremities. PSYCH: alert and oriented x 3, normal mood and  affect.    ASSESSMENT & PLAN:    1. Essential hypertension   2. Mixed hyperlipidemia     Non-ST elevation (NSTEMI) myocardial infarction (HCC) -  Continue ASA, plavix (for prior stroke), BB, statin. Will increase Crestor to 10 mg daily.   Mixed hyperlipidemia - LDL above goal, 105, would further optimize by increasing crestor to 10 mg daily.    Essential hypertension - adequate control. Now off of nadolol due to reported hypotension.    Type 2 diabetes mellitus with diabetic neuropathy, without long-term current use of insulin (HCC) - per PCP. Last A1C 5.5 in March '22. Diet controlled   OSA (obstructive sleep apnea) - uses CPAP.    Total time of encounter: 30 minutes total time of encounter, including 20 minutes spent in face-to-face patient care on the date of this encounter. This time includes coordination of care and counseling regarding above mentioned problem list. Remainder of non-face-to-face time involved reviewing chart documents/testing relevant to the patient encounter and documentation in the medical record. I have independently reviewed documentation from referring provider.   Cherlynn Kaiser, MD, [facility]     Medication Adjustments/Labs and Tests Ordered: Current medicines are reviewed at length with the patient today.  Concerns regarding medicines are outlined above.   Patient Instructions  Medication Instructions:  INCREASE CRESTOR TO 10mg  DAILY  *If you need a refill on your cardiac medications before your next appointment, please call your pharmacy*  Lab Work: FASTING LABS IN 3 [place]  If you have labs (blood work) drawn today and your tests are  completely normal, you will receive your results only by: Marland Kitchen MyChart Message (if you have MyChart) OR . A paper copy in the mail If you have any lab test that is abnormal or we need to change your treatment, we will call you to review the results.  Follow-Up: At [facility], you and your health needs are our priority.  As part of our continuing mission to provide you with exceptional heart care, we have created designated Provider Care Teams.  These Care Teams include your primary Cardiologist (physician) and Advanced Practice Providers (APPs -  Physician Assistants and Nurse Practitioners) who all work together to provide you with the care you need, when you need it.   Your next appointment:   1 year(s)  The format for your next appointment:   In Person  Provider:   Cherlynn Kaiser, MD    [facility],acting as a scribe for Valerie Munroe, MD.,have documented all relevant documentation on the behalf of Valerie Munroe, MD,as directed by  Valerie Munroe, MD while in the presence of Valerie Munroe, MD.  I, Valerie Munroe, MD, have reviewed all documentation for this visit. The documentation on 01/10/21 for the exam, diagnosis, procedures, and orders are all accurate and complete.   Signed, Valerie Munroe, MD  01/10/2021 5:09 PM    [facility]

## 2021-01-10 NOTE — Telephone Encounter (Signed)
Labs reviewed with Dr. [name] at [facility] 01/10/21.

## 2021-01-10 NOTE — Patient Instructions (Addendum)
Medication Instructions:  INCREASE CRESTOR TO 10mg  DAILY  *If you need a refill on your cardiac medications before your next appointment, please call your pharmacy*  Lab Work: FASTING LABS IN 3 [place]  If you have labs (blood work) drawn today and your tests are completely normal, you will receive your results only by: Marland Kitchen MyChart Message (if you have MyChart) OR . A paper copy in the mail If you have any lab test that is abnormal or we need to change your treatment, we will call you to review the results.  Follow-Up: At [facility], you and your health needs are our priority.  As part of our continuing mission to provide you with exceptional heart care, we have created designated Provider Care Teams.  These Care Teams include your primary Cardiologist (physician) and Advanced Practice Providers (APPs -  Physician Assistants and Nurse Practitioners) who all work together to provide you with the care you need, when you need it.   Your next appointment:   1 year(s)  The format for your next appointment:   In Person  Provider:   Cherlynn Kaiser, MD

## 2021-02-10 ENCOUNTER — Other Ambulatory Visit: Payer: Self-pay | Admitting: Obstetrics and Gynecology

## 2021-02-10 DIAGNOSIS — Z1231 Encounter for screening mammogram for malignant neoplasm of breast: Secondary | ICD-10-CM

## 2021-02-13 ENCOUNTER — Other Ambulatory Visit: Payer: Self-pay

## 2021-02-13 ENCOUNTER — Ambulatory Visit
Admission: RE | Admit: 2021-02-13 | Discharge: 2021-02-13 | Disposition: A | Discharge status: Home / Self Care | Payer: Medicare Other | Source: Ambulatory Visit | Attending: Obstetrics and Gynecology | Admitting: Obstetrics and Gynecology

## 2021-02-13 DIAGNOSIS — Z1231 Encounter for screening mammogram for malignant neoplasm of breast: Secondary | ICD-10-CM

## 2021-02-17 ENCOUNTER — Other Ambulatory Visit: Payer: Self-pay | Admitting: Obstetrics and Gynecology

## 2021-02-17 DIAGNOSIS — R928 Other abnormal and inconclusive findings on diagnostic imaging of breast: Secondary | ICD-10-CM

## 2021-02-25 ENCOUNTER — Encounter: Payer: Self-pay | Admitting: Podiatry

## 2021-02-25 ENCOUNTER — Other Ambulatory Visit: Payer: Self-pay

## 2021-02-25 ENCOUNTER — Ambulatory Visit (INDEPENDENT_AMBULATORY_CARE_PROVIDER_SITE_OTHER): Payer: Medicare Other | Admitting: Podiatry

## 2021-02-25 DIAGNOSIS — L600 Ingrowing nail: Secondary | ICD-10-CM | POA: Diagnosis not present

## 2021-02-25 DIAGNOSIS — G5781 Other specified mononeuropathies of right lower limb: Secondary | ICD-10-CM

## 2021-02-25 MED ORDER — TRIAMCINOLONE ACETONIDE 40 MG/ML IJ SUSP
20.0000 mg | Freq: Once | INTRAMUSCULAR | Status: AC
  Administered 2021-02-25: 20 mg

## 2021-02-25 MED ORDER — NEOMYCIN-POLYMYXIN-HC 1 % OT SOLN: OTIC | 1 refills | Status: DC

## 2021-02-25 NOTE — Patient Instructions (Signed)

## 2021-02-26 NOTE — Progress Notes (Signed)
She presents today chief complaint of pain around the fifth metatarsophalangeal joint area of the right foot.  States that is slightly callused area plantarly but the foot seems to be wanting to turn out.  Also complaining of a painful nail hallux right medial border.  Objective: Vital signs are stable alert and oriented x3.  Pulses are palpable.  Neurologic sensorium is intact.  Deep tendon reflexes are intact muscle strength normal symmetrical.  She does have a mild tailor's bunion deformity with metatarsus adductus.  Ingrown toenail paronychia medial border hallux right  She has pain on palpation to the intermetatarsal space fourth right.  Assessment: Ingrown nail medial border hallux right capsulitis neuroma fourth interdigital space right.  Plan: Discussed etiology pathology conservative surgical therapies at this point I injected dexamethasone to the fourth intermetatarsal space and performed a chemical matricectomy to the tibial border of the hallux.  She tolerated procedure well after local anesthetic.  And I will follow-up with her in 2 weeks or so.  She was given both oral written home-going structure for care and soaking the toe as well as a prescription for Corticosporin otic to be applied twice daily after soaking.

## 2021-02-27 ENCOUNTER — Telehealth: Payer: Self-pay | Admitting: *Deleted

## 2021-02-27 NOTE — Telephone Encounter (Signed)
Patient is calling to let doctor know that the drops prescribed for her toe are too expensive(60.00) to purchase. Please advise.

## 2021-02-27 NOTE — Telephone Encounter (Signed)
Returned call to patient and gave Dr Stephenie Acres recommendations ,verbalized understanding.

## 2021-03-04 ENCOUNTER — Telehealth: Payer: Self-pay

## 2021-03-04 MED ORDER — NITROGLYCERIN 0.4 MG SL SUBL: 0.4000 mg | SUBLINGUAL_TABLET | SUBLINGUAL | 2 refills | Status: DC | PRN

## 2021-03-04 NOTE — Telephone Encounter (Signed)
Received a call from the patient stating she just got off the phone with her pharmacy and was told we denied her Nitroglycerin.   I advised patient to hold for a moment for me to review her chart.   I saw she had a recent appointment with her provider.   Rx(s) sent to pharmacy electronically.  Patient notified and voiced understanding.

## 2021-03-05 ENCOUNTER — Other Ambulatory Visit: Payer: Self-pay | Admitting: Internal Medicine

## 2021-03-07 ENCOUNTER — Other Ambulatory Visit: Payer: Self-pay | Admitting: Internal Medicine

## 2021-03-10 ENCOUNTER — Other Ambulatory Visit: Payer: Self-pay | Admitting: Internal Medicine

## 2021-03-10 ENCOUNTER — Ambulatory Visit
Admission: RE | Admit: 2021-03-10 | Discharge: 2021-03-10 | Disposition: A | Discharge status: Home / Self Care | Payer: Medicare Other | Source: Ambulatory Visit | Attending: Obstetrics and Gynecology | Admitting: Obstetrics and Gynecology

## 2021-03-10 ENCOUNTER — Other Ambulatory Visit: Payer: Self-pay

## 2021-03-10 ENCOUNTER — Ambulatory Visit: Payer: Medicare Other

## 2021-03-10 DIAGNOSIS — R928 Other abnormal and inconclusive findings on diagnostic imaging of breast: Secondary | ICD-10-CM

## 2021-03-23 ENCOUNTER — Other Ambulatory Visit: Payer: Self-pay | Admitting: Internal Medicine

## 2021-03-25 ENCOUNTER — Other Ambulatory Visit: Payer: Self-pay

## 2021-03-25 ENCOUNTER — Ambulatory Visit (INDEPENDENT_AMBULATORY_CARE_PROVIDER_SITE_OTHER): Payer: Medicare Other | Admitting: Podiatry

## 2021-03-25 ENCOUNTER — Encounter: Payer: Self-pay | Admitting: Podiatry

## 2021-03-25 DIAGNOSIS — G5781 Other specified mononeuropathies of right lower limb: Secondary | ICD-10-CM

## 2021-03-25 DIAGNOSIS — Z9889 Other specified postprocedural states: Secondary | ICD-10-CM

## 2021-03-25 DIAGNOSIS — L600 Ingrowing nail: Secondary | ICD-10-CM

## 2021-03-25 NOTE — Progress Notes (Signed)
She presents today for follow-up of nail procedure hallux right.  States that is doing really well the neuroma to the fourth interdigital space appears to be doing okay she says it still hurts some but she has not gotten a new shoes as we had instructed.  Objective: Vital signs are stable alert oriented x3.  Matricectomy site appears to be healing very nicely and that there is no erythema cellulitis drainage or odor.  She still has some tenderness on palpation of the distal aspect of the fourth intermetatarsal space of the right foot.  No palpable Mulder's click is noted.  Assessment: Neuritis t fourth interdigital space right foot.  Also well-healing surgical toe.  Plan: Follow-up with me as needed.

## 2021-04-10 ENCOUNTER — Other Ambulatory Visit: Payer: Self-pay

## 2021-04-10 DIAGNOSIS — E782 Mixed hyperlipidemia: Secondary | ICD-10-CM

## 2021-04-10 DIAGNOSIS — I10 Essential (primary) hypertension: Secondary | ICD-10-CM

## 2021-04-15 ENCOUNTER — Encounter: Payer: Self-pay | Admitting: Internal Medicine

## 2021-04-15 ENCOUNTER — Ambulatory Visit (INDEPENDENT_AMBULATORY_CARE_PROVIDER_SITE_OTHER): Payer: Medicare Other | Admitting: Internal Medicine

## 2021-04-15 ENCOUNTER — Other Ambulatory Visit: Payer: Self-pay

## 2021-04-15 VITALS — BP 120/72 | HR 90 | Ht 62.0 in | Wt 199.6 lb

## 2021-04-15 DIAGNOSIS — E042 Nontoxic multinodular goiter: Secondary | ICD-10-CM

## 2021-04-15 DIAGNOSIS — E213 Hyperparathyroidism, unspecified: Secondary | ICD-10-CM | POA: Diagnosis not present

## 2021-04-15 NOTE — Progress Notes (Signed)
Patient ID: Valerie [name], female   DOB: [birth year], 60 y.o.   MRN: [id number]   This visit occurred during the SARS-CoV-2 public health emergency.  Safety protocols were in place, including screening questions prior to the visit, additional usage of staff PPE, and extensive cleaning of exam room while observing appropriate contact time as indicated for disinfecting solutions.   HPI  Valerie [name] is a 60 y.o.-year-old female, initially referred by Dr. Erlinda Hong, returning for follow-up for primary hyperparathyroidism and thyroid nodules.  Last visit 5 months ago.  Interim history: Since last visit, she had left inferior parathyroidectomy by Dr. Harlow Asa on 01/09/2021. She felt better after the surgery. She continues to have significant fatigue, joint pains, muscle aches. She has psoriatic arthritis >> prev. On  Enbrel, MTX, higher Prednisone (now only on 5 mg daily). Mostly shoulders, knees, hands. Worse in last 2 weeks. 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  Reviewed and addended history: At our first visit, patient presented after hospital admission for metabolic encephalopathy in the setting of hypercalcemia after having colovesical fistula repair in 06/2020. She has another fistula >> will have repair Sx in 09/2019. Before her first Sx, she was on TPN for 6 weeks.  Calcium levels were elevated in 10/2019 when she had an AMI and a stroke.  During her recent admission, calcium level was found to be as high as 12.8.  The PTH was not suppressed, at 73.   While in the hospital, she was given Zolendronic acid x1, calcitonin, and hydrated and her calcium returned to normal and then decreased to even the lower limit of normal.  Of note, calcitriol level, vitamin D, TSH, ACE, PTHrp, 24-hour urine calcium and spot Ca/Cr ratio were all normal.  Phosphorus was low (lowest 1.0).   I reviewed pt's pertinent labs: 02/04/2021: Calcium 9.0, PTH 72 Lab Results  Component Value Date   PTH 113  (H) 12/03/2020   PTH Comment 12/03/2020   PTH 73 (H) 08/09/2020   PTH Comment 08/09/2020   CALCIUM 9.4 01/07/2021   CALCIUM 10.0 12/03/2020   CALCIUM 7.9 (L) 08/19/2020   CALCIUM 7.9 (L) 08/18/2020   CALCIUM 8.2 (L) 08/16/2020   CALCIUM 8.3 (L) 08/15/2020   CALCIUM 8.9 08/14/2020   CALCIUM 9.5 08/13/2020   CALCIUM 10.3 08/12/2020   CALCIUM 11.7 (H) 08/11/2020   CALCIUM 11.9 (H) 08/10/2020   CALCIUM 12.0 (H) 08/09/2020   CALCIUM 12.8 (H) 08/09/2020   CALCIUM 11.4 (H) 07/21/2020   CALCIUM 11.4 (H) 07/20/2020   CALCIUM 11.7 (H) 07/19/2020   CALCIUM 11.6 (H) 07/19/2020   CALCIUM 10.7 (H) 07/18/2020   CALCIUM 11.3 (H) 07/17/2020   CALCIUM 10.1 07/08/2020   CALCIUM 9.3 06/13/2020   CALCIUM 9.8 06/10/2020   CALCIUM 10.1 06/09/2020   CALCIUM 10.4 (H) 06/08/2020   CALCIUM 10.7 (H) 06/07/2020   CALCIUM 10.5 (H) 06/06/2020   CALCIUM 10.8 (H) 06/05/2020   CALCIUM 11.5 (H) 06/03/2020   CALCIUM 11.2 (H) 05/15/2020   CALCIUM 12.9 (H) 05/14/2020   CALCIUM 9.8 02/20/2020   CALCIUM 10.2 11/14/2019   CALCIUM 10.4 (H) 11/13/2019   Lab Results  Component Value Date   PHOS 3.0 08/18/2020   PHOS 2.2 (L) 08/16/2020   PHOS 1.9 (L) 08/15/2020   PHOS 1.8 (L) 08/14/2020   PHOS 1.0 (LL) 08/13/2020   PHOS 1.1 (L) 08/12/2020   PHOS 2.3 (L) 08/11/2020   PHOS 2.7 08/09/2020   PHOS 1.9 (L) 07/21/2020   PHOS 3.1 07/18/2020  Component     Latest Ref Rng & Units 08/16/2020  Magnesium     1.7 - 2.4 mg/dL 2.3   Component     Latest Ref Rng & Units 08/09/2020 08/10/2020 08/11/2020 08/14/2020  Calcium, Ur     Not Estab. mg/dL  6.0 6.2   Calcium/Creat.Ratio     29 - 442 mg/g creat  154    Creatinine, Urine     Not Estab. mg/dL  39.0    Calcium, 24 hour urine     0 - 320 mg/24 hr   143   Vit D, 1,25-Dihydroxy     19.9 - 79.3 pg/mL 33.2     PTH-related peptide     pmol/L   <2.0   Angiotensin-Converting Enzyme     14 - 82 U/L    41   FE Ca = 0.02  A thyroid ultrasound (01/05/2020)  showed 4 nodules, but none of them reported as possible parathyroid adenomas: THYROID ULTRASOUND  INDICATION: Nontoxic single thyroid nodule thyroid nodules seen on carotid ultrasound performed at doctor's office  COMPARISON: None  ISTHMUS:  - Size: 0.4 cm.   RIGHT LOBE:  - Size: 3.6 x 1.2 x 1.3 cm.  - Echogenicity: Normal.   LEFT LOBE:  - Size: 3.9 x 2.4 x 2.7 cm.  - Echogenicity: Normal.   NODULES:   - Nodule 1:  -- Size: 0.7 x 0.6 x 0.7 cm (long x AP x trans).  -- Location: Right mid.  -- Composition: spongiform (0)  -- Echogenicity: hypoechoic (2)  -- Shape: wider-than-tall (0)  -- Margins: ill-defined (0)  -- Echogenic foci: none (0)  -- ACR TI-RADS total points and risk category: 2 Points - TR2.   - Nodule 2:  -- Size: 2.4 x 1.6 x 2 cm (long x AP x trans).  -- Location: Left mid.  -- Composition: solid or almost completely solid (2)  -- Echogenicity: hypoechoic (2)  -- Shape: wider-than-tall (0)  -- Margins: ill-defined (0)  -- Echogenic foci: none (0)  -- ACR TI-RADS total points and risk category: 4 Points - TR4.   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 [facility] System Category II  - Nodule 3:  -- Size: 0.6 x 0.4 x 0.6 cm (long x AP x trans).  -- Location: Left lower.  -- Composition: cystic or almost completely cystic (0)   - Nodule 4:  -- Size: 0.8 x 0.6 x 0.7 cm (long x AP x trans).  -- Location: Left lower.  -- Composition: solid or almost completely solid (2)  -- Echogenicity: isoechoic (1)  -- Shape: wider-than-tall (0)  -- Margins: smooth (0)  -- Echogenic foci: none (0)  -- ACR TI-RADS total points and risk category: 3 Points - TR3.   Technetium sestamibi scan (11/23/2020): Washout of radiotracer activity from the thyroid gland at 2 hours. Small focus faint activity inferior to the LEFT lobe of  thyroid gland is noted on planar imaging.   SPECT imaging of the neck and chest demonstrate a small focus activity which is faintly above the residual thyroid activit localizing to the lower pole of the LEFT lobe of thyroid gland.   Inferior to the LEFT lower pole activity, there is a second small focus of activity which is very faint.   IMPRESSION: 1. No convincing evidence parathyroid adenoma adenoma. 2. Focal activity in the lower pole of the  LEFT lobe of thyroid gland and small focus inferior to LEFT lobe of thyroid gland would be most likely location of parathyroid adenomas. Consider CT of the neck with contrast for further evaluation.  4DCT (12/17/2020):  Possible left inferior parathyroid adenoma, measuring 9 mm  Left inferior parathyroidectomy (01/09/2021): final pathology: 0.465 mg hypercellular parathyroid tissue consistent with adenoma (1.0 x 0.8 x 0.7 cm)  She is on B complex.  No previous DXA scan reports available for review. No h/o OP.  No fractures or falls.   + h/o kidney stone x1 "years ago".  +  CKD. Last BUN/Cr: Lab Results  Component Value Date   BUN 32 (H) 01/07/2021   BUN 11 08/19/2020   CREATININE 1.40 (H) 01/07/2021   CREATININE 1.36 (H) 08/19/2020   Pt is not on HCTZ.  No h/o vitamin D deficiency. Reviewed vit D levels: Lab Results  Component Value Date   VD25OH 50.6 12/03/2020   VD25OH 68.85 08/09/2020   Pt takes vitamin D 1000 units daily.  Pt does not have a FH of hypercalcemia, pituitary tumors, thyroid cancer, or osteoporosis.   Pt. also has a history of CAD, history of NSTEMI.   She was dx'ed with scurvy several years ago.  Also has a history of peripheral neuropathy, psoriatic arthritis, prediabetes, hyperlipidemia.  She is on B12. She sees neurology for PN.  Tried Lyrica and Neurontin but this did not work well for her in the past.  ROS: Constitutional: no weight loss, no fatigue, no subjective hyperthermia Eyes: + Blurry  vision, no xerophthalmia ENT: no sore throat, no nodules palpated in throat, no dysphagia/odynophagia, no hoarseness Cardiovascular: no CP/SOB/palpitations/no leg swelling Respiratory: no cough/SOB Gastrointestinal: no N/V/D/C Musculoskeletal: no muscle/+ joint aches Skin: no rashes Neurological: no tremors/+numbness/+ tingling/dizziness  Past Medical History:  Diagnosis Date   Anemia    Anxiety    Arthritis    Psoriatic   Bipolar disorder (HCC)    CKD (chronic kidney disease), stage III (HCC)    Colovesical fistula    Depression    DM type 2 (diabetes mellitus, type 2) ([place]) 11/14/2019   Dyspnea    Headache    Neuropathy    NSTEMI (non-ST elevated myocardial infarction) ([place]) 11/14/2019   OSA (obstructive sleep apnea)    Psoriatic arthritis ([place])    Sleep apnea    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   Stroke ([place])    ?history; MRI normal   Past Surgical History:  Procedure Laterality Date   ABDOMINAL HYSTERECTOMY     APPENDECTOMY     CHOLECYSTECTOMY     CYSTOSCOPY WITH STENT PLACEMENT N/A 07/17/2020   Procedure: CYSTOSCOPY WITH BILATERAL FIREFLY INJECTION;  Surgeon: Raynelle Bring, MD;  Location: [facility];  Service: Urology;  Laterality: N/A;   FLEXIBLE SIGMOIDOSCOPY N/A 05/17/2020   Procedure: FLEXIBLE SIGMOIDOSCOPY;  Surgeon: Carol Ada, MD;  Location: [facility];  Service: Endoscopy;  Laterality: N/A;   FOOT SURGERY Right    IR FLUORO GUIDE CV LINE RIGHT  08/19/2020   IR RADIOLOGIST EVAL & MGMT  09/10/2020   IR RADIOLOGIST EVAL & MGMT  10/10/2020   IR REMOVAL TUN CV CATH W/O FL  09/16/2020   IR SINUS/FIST TUBE CHK-NON GI  10/24/2020   IR SINUS/FIST TUBE CHK-NON GI  10/31/2020   IR US GUIDE VASC ACCESS RIGHT  08/19/2020   LEFT HEART CATH AND CORONARY ANGIOGRAPHY N/A 11/14/2019   Procedure: LEFT HEART CATH AND CORONARY ANGIOGRAPHY;  Surgeon: Martinique, Peter M, MD;  Location: [facility];  Service: Cardiovascular;  Laterality: N/A;   PARATHYROID EXPLORATION N/A  01/09/2021   Procedure: POSSIBLE NECK EXPLORATION;  Surgeon: Armandina Gemma, MD;  Location: [facility];  Service: General;  Laterality: N/A;   PARATHYROIDECTOMY Left 01/09/2021   Procedure: LEFT INFERIOR PARATHYROIDECTOMY;  Surgeon: Armandina Gemma, MD;  Location: [facility];  Service: General;  Laterality: Left;   PROCTOSCOPY N/A 07/17/2020   Procedure: RIGID PROCTOSCOPY;  Surgeon: Michael Boston, MD;  Location: [facility];  Service: General;  Laterality: N/A;   TONSILLECTOMY     TOTAL HIP ARTHROPLASTY Bilateral    Social History   Socioeconomic History   Marital status: Divorced    Spouse name: Not on file   Number of children: 2   Years of education: Not on file   Highest education level: Not on file  Occupational History   Occupation: Disabled  Tobacco Use   Smoking status: Every Day    Packs/day: 0.50    Types: Cigarettes   Smokeless tobacco: Never  Vaping Use   Vaping Use: Never used  Substance and Sexual Activity   Alcohol use: Not Currently   Drug use: Never   Sexual activity: Not on file  Other Topics Concern   Not on file  Social History Narrative   Not on file   Social Determinants of Health   Financial Resource Strain: Not on file  Food Insecurity: Not on file  Transportation Needs: Not on file  Physical Activity: Not on file  Stress: Not on file  Social Connections: Not on file  Intimate Partner Violence: Not on file   Current Outpatient Medications on File Prior to Visit  Medication Sig Dispense Refill   ALPRAZolam (XANAX) 1 MG tablet Take 1 tablet (1 mg total) by mouth 2 (two) times daily. (Patient taking differently: Take 1 mg by mouth 2 (two) times daily as needed for anxiety.) 20 tablet 0   Ascorbic Acid (VITAMIN C) 1000 MG tablet Take 1,000 mg by mouth at bedtime.     aspirin EC 81 MG tablet Take 81 mg by mouth daily. Swallow whole.     Cholecalciferol (VITAMIN D-3) 1000 units CAPS Take 2,000 Units by mouth daily.     clopidogrel (PLAVIX) 75 MG tablet Take 75 mg by  mouth daily.     Cyanocobalamin (VITAMIN B-12 PO) Take 1 tablet by mouth daily.     docusate sodium (COLACE) 100 MG capsule Take 200 mg by mouth at bedtime.     donepezil (ARICEPT) 10 MG tablet Take 10 mg by mouth daily.     escitalopram (LEXAPRO) 20 MG tablet Take 30 mg by mouth daily.   0   FLUoxetine (PROZAC) 10 MG capsule Take by mouth.     furosemide (LASIX) 20 MG tablet Take 1 tablet (20 mg total) by mouth daily. (Patient taking differently: Take 20 mg by mouth daily as needed for fluid or edema.) 30 tablet 1   HYDROcodone-acetaminophen (NORCO) 10-325 MG tablet Take 0.5-1 tablets by mouth every 6 (six) hours as needed for moderate pain or severe pain. 20 tablet 0   ibuprofen (ADVIL) 200 MG tablet Take 800 mg by mouth every 6 (six) hours as needed for headache, moderate pain or mild pain.     lamoTRIgine (LAMICTAL) 150 MG tablet Take 150 mg by mouth 2 (two) times daily.   0   levocetirizine (XYZAL) 5 MG tablet Take 5 mg by mouth every evening.     NEOMYCIN-POLYMYXIN-HYDROCORTISONE (CORTISPORIN) 1 % SOLN  OTIC solution Apply 1-2 drops to toe BID after soaking 10 mL 1   nitroGLYCERIN (NITROSTAT) 0.4 MG SL tablet Place 1 tablet (0.4 mg total) under the tongue every 5 (five) minutes as needed for chest pain. 30 tablet 2   omeprazole (PRILOSEC) 20 MG capsule Take 1 capsule (20 mg total) by mouth 2 (two) times daily before a meal. (Patient taking differently: Take 20 mg by mouth daily.) 60 capsule 3   oxybutynin (DITROPAN) 5 MG tablet Take 5 mg by mouth 2 (two) times daily.     phenazopyridine (PYRIDIUM) 100 MG tablet Take 1 tablet (100 mg total) by mouth 3 (three) times daily as needed (bladder spasm). (Patient not taking: Reported on 01/10/2021) 10 tablet 0   polyethylene glycol (MIRALAX / GLYCOLAX) 17 g packet Take 17 g by mouth daily. (Patient taking differently: Take 17 g by mouth daily as needed for mild constipation or moderate constipation.) 14 each 0   predniSONE (DELTASONE) 10 MG tablet Take  5 mg by mouth daily with breakfast. Take 5 mg by mouth every other day, alternating with 10 mg on opposing days     Probiotic Product (PROBIOTIC DAILY PO) Take 1 tablet by mouth daily.     rizatriptan (MAXALT-MLT) 10 MG disintegrating tablet Take 10 mg by mouth See admin instructions. Take one tablet (10 mg) by mouth daily as needed for migraine. May repeat in 2 hours if still needed     rosuvastatin (CRESTOR) 10 MG tablet Take 1 tablet (10 mg total) by mouth daily. 90 tablet 3   traMADol (ULTRAM) 50 MG tablet Take 1-2 tablets (50-100 mg total) by mouth every 6 (six) hours as needed for moderate pain. 15 tablet 0   traZODone (DESYREL) 100 MG tablet Take 2 tablets (200 mg total) by mouth at bedtime. 60 tablet 0   No current facility-administered medications on file prior to visit.   Allergies  Allergen Reactions   Carbamazepine Other (See Comments)    Parkinsons like symptoms tremors   Sertraline Hcl Other (See Comments)    Unknown reaction   Family History  Problem Relation Age of Onset   Breast cancer Neg Hx     PE: BP 120/72 (BP Location: Right Arm, Patient Position: Sitting, Cuff Size: Normal)   Pulse 90   Ht '5\' 2"'$  (1.575 m)   Wt 199 lb 9.6 oz (90.5 kg)   SpO2 95%   BMI 36.51 kg/m  Wt Readings from Last 3 Encounters:  04/15/21 199 lb 9.6 oz (90.5 kg)  01/10/21 205 lb 9.6 oz (93.3 kg)  01/09/21 203 lb 4.2 oz (92.2 kg)   Constitutional: overweight, in NAD. No kyphosis. Walks with a cane. Eyes: PERRLA, EOMI, no exophthalmos ENT: moist mucous membranes, no thyromegaly, no nodules palpated, no cervical lymphadenopathy Cardiovascular: RRR, No MRG  Respiratory: CTA B Gastrointestinal: abdomen soft, NT, ND, BS+ Musculoskeletal: no deformities, strength intact in all 4 Skin: moist, warm, no rashes, + bruises on forearms Neurological: + tremor with outstretched hands, DTR normal in all 4  Assessment: 1.  Primary hyperparathyroidism  2. Thyroid nodules  Plan: Patient with  several instances of elevated calcium in the past, with the highest being 12.8-12.9.  A corresponding intact PTH level was also high, at 72.  She also has a history of vitamin D deficiency, but recent levels have been normal. -She has apparent complications from hypercalcemia: No osteoporosis, fractures, but apparently had a kidney stone years ago. -At last visits, we checked her 24-hour urine  calcium and this was normal, with a fractional excretion of calcium of 0.02.  She had extensive evaluation for her elevated calcium in the past including: normal calcitriol level, PTH rp, ACE, SPEP/UPEP magnesium, and phosphorus levels were normal.  She does have a history of CKD but this is mild and not severe enough to explain her significantly elevated calcium. -I referred her to see Dr. Harlow Asa, we will check this technetium sestamibi scan (11/23/2020) which did not show a clear parathyroid lesion.  There was the possibility of a left inferior adenoma.  She also had a 4 D CT (12/17/2020) which also showed a possible left inferior parathyroid adenoma measuring 9 mm.  She had left inferior parathyroidectomy on 01/09/2021.  Final pathology showed a 0.465 g parathyroid adenoma measuring 1.2 cm in the largest dimension. -I reviewed the labs obtained after surgery and the calcium level was excellent, at 9.0, while the PTH decreased to 72, still slightly above normal (01/2021) -Her vitamin D level was normal in 11/2020 - will not recheck this today.  She continues on supplementation with 1000 units vitamin D daily. -At today's visit, we will repeat her calcium and PTH -I will see her back in 1 year  2. Thyroid nodules -Patient with history of 3 subcentimeter and 1 supra centimeters thyroid nodule, of which the largest nodule was biopsied with benign results in 2021 -She denies neck compression symptoms -Latest TSH was normal -We will repeat a thyroid ultrasound at this visit  Orders Placed This Encounter  Procedures    US THYROID   PTH, intact and calcium   Thyroid U/S (04/17/2021): Parenchymal Echotexture: Moderately heterogeneous Isthmus: 0.4 cm Right lobe: 3.9 x 1.2 x 1.9 cm Left lobe: 3.6 x 1.2 x 1.7 cm _________________________________________________________   Nodule 1: 0.7 cm spongiform nodule in the right mid thyroid lobe does not meet criteria for FNA or surveillance.  _________________________________________________________   Nodule # 2: Location: Left; mid Maximum size: 1.3 cm; Other 2 dimensions: 1.1 x 0.8 cm Composition: solid/almost completely solid (2) Echogenicity: isoechoic (1) Given size (<1.4 cm) and appearance, this nodule does NOT meet TI-RADS criteria for biopsy or dedicated follow-up.  _________________________________________________________   Nodule # 3: Location: Left; inferior Maximum size: 1.0 cm; Other 2 dimensions: 0.9 x 0.5 cm Composition: solid/almost completely solid (2) Echogenicity: hypoechoic (2)  *Given size (>/= 1 - 1.4 cm) and appearance, a follow-up ultrasound in 1 year should be considered based on TI-RADS criteria.  ________________________________________________________   IMPRESSION: Nodule 3 (TI-RADS 4) located in the inferior left thyroid lobe meets criteria for imaging follow-up. Annual ultrasound surveillance is recommended until 5 years of stability is documented.  Office Visit on 04/15/2021  Component Date Value Ref Range Status   Calcium 04/15/2021 8.5 (A) 8.7 - 10.3 mg/dL Final   PTH 04/15/2021 84 (A) 15 - 65 pg/mL Final   PTH Interp 04/15/2021 Comment   Final   Comment: Interpretation                 Intact PTH    Calcium                                 (pg/mL)      (mg/dL) Normal                          15 - 65     8.6 - 10.2 Primary  Hyperparathyroidism         >65          >10.2 Secondary Hyperparathyroidism       >65          <10.2 Non-Parathyroid Hypercalcemia       <65          >10.2 Hypoparathyroidism                  <15           < 8.6 Non-Parathyroid Hypocalcemia    15 - 65          < 8.6     PTH is slightly high, will calcium is slightly low.  For now, we will continue to just follow these and repeat a calcium, PTH, and vitamin D in 1 month.  Philemon Kingdom, MD PhD [facility]

## 2021-04-15 NOTE — Patient Instructions (Signed)
Please stop at the lab.  We will schedule a new thyroid ultrasound for you.  Please come back for a follow-up appointment in 1 year.

## 2021-04-16 LAB — PTH, INTACT AND CALCIUM
Calcium: 8.5 mg/dL — ABNORMAL LOW (ref 8.7–10.3)
PTH: 84 pg/mL — ABNORMAL HIGH (ref 15–65)

## 2021-04-17 ENCOUNTER — Ambulatory Visit
Admission: RE | Admit: 2021-04-17 | Discharge: 2021-04-17 | Disposition: A | Discharge status: Home / Self Care | Payer: Medicare Other | Source: Ambulatory Visit | Attending: Internal Medicine | Admitting: Internal Medicine

## 2021-04-17 DIAGNOSIS — E042 Nontoxic multinodular goiter: Secondary | ICD-10-CM

## 2021-08-17 ENCOUNTER — Ambulatory Visit: Admit: 2021-08-17 | Payer: Medicare Other | Admitting: Surgery

## 2021-08-17 SURGERY — PARATHYROIDECTOMY
Anesthesia: General

## 2021-09-09 LAB — COLOGUARD: Cologuard: NEGATIVE

## 2021-09-16 LAB — COLOGUARD: COLOGUARD: NEGATIVE

## 2021-10-04 IMAGING — MR MR HEAD W/O CM
10 series · 48 of 48 positions shown · non-contrast
Comparison: Head CT 07/19/2020 and MRI 02/20/2020

CLINICAL DATA: Altered mental status.

EXAM:
MRI HEAD WITHOUT CONTRAST
TECHNIQUE: Multiplanar, multiecho pulse sequences of the brain and surrounding
structures were obtained without intravenous contrast.

[Series 11: DWI · axial · 3.0mm · 1.36mm/px · z∈[-42,+109]mm · 11 of 104 slices shown (1 of 4)]
[im 1/104]
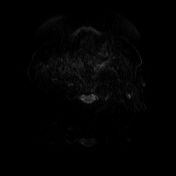
[im 11/104]
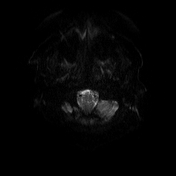
[im 21/104]
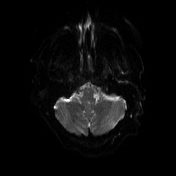
[im 31/104]
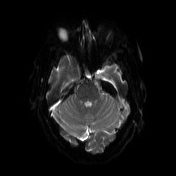
[im 42/104]
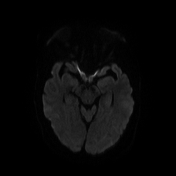
[im 52/104]
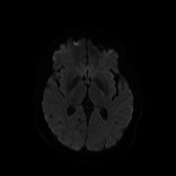
[im 62/104]
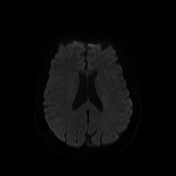
[im 73/104]
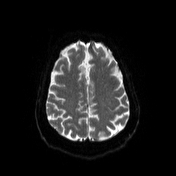
[im 83/104]
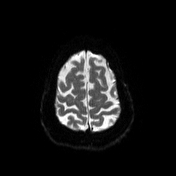
[im 93/104]
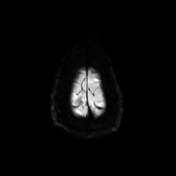
[im 104/104]
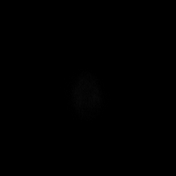

[Series 12: DWI · axial · 3.0mm · 1.36mm/px · z∈[-42,+109]mm · 5 of 52 slices shown (2 of 4)]
[im 1/52]
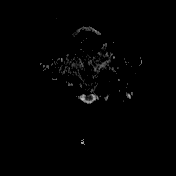
[im 13/52]
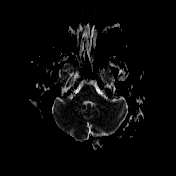
[im 26/52]
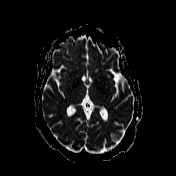
[im 39/52]
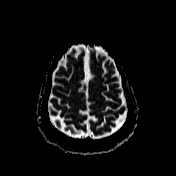
[im 52/52]
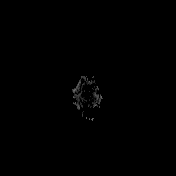

[Series 13: T1 · sagittal · 5.0mm · 0.75mm/px · 2 of 22 slices shown (1 of 2)]
[im 1/22]
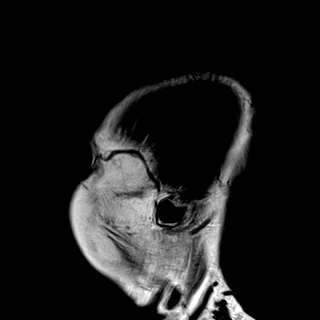
[im 22/22]
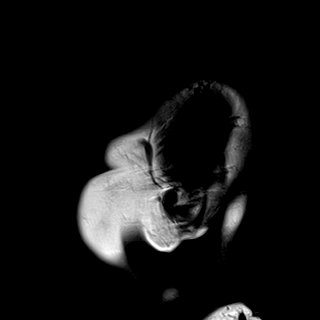

[Series 14: T2 · axial · 5.0mm · 0.45mm/px · z∈[-48,+113]mm · 3 of 26 slices shown (1 of 2)]
[im 1/26]
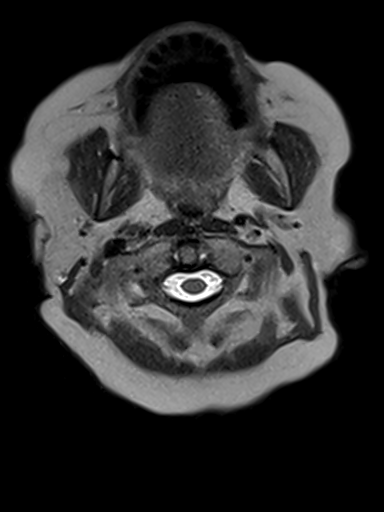
[im 13/26]
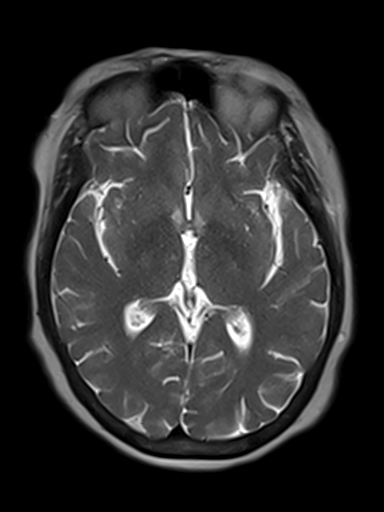
[im 26/26]
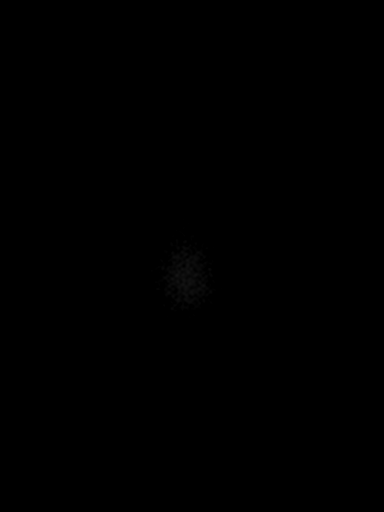

[Series 15: GRE · axial · 3.0mm · 0.45mm/px · z∈[-42,+107]mm · 5 of 51 slices shown]
[im 1/51]
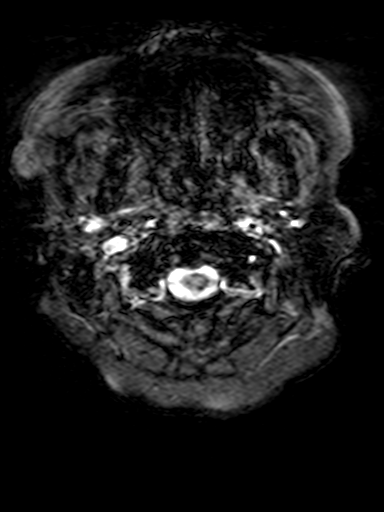
[im 13/51]
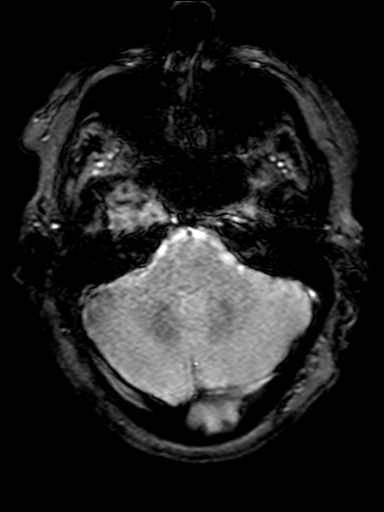
[im 26/51]
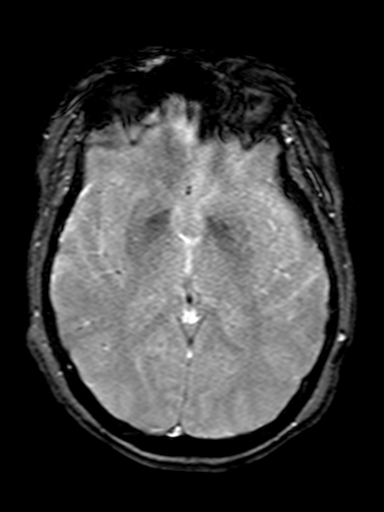
[im 38/51]
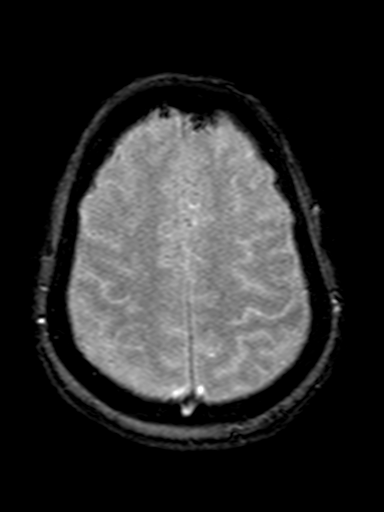
[im 51/51]
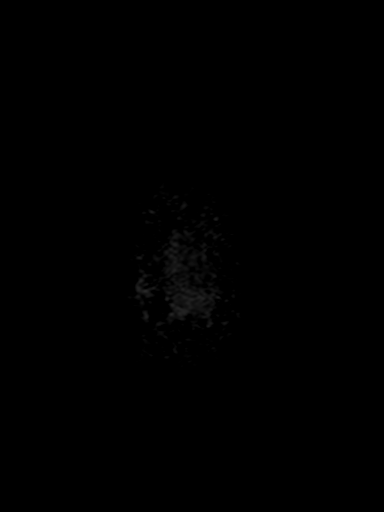

[Series 16: FLAIR · axial · 3.0mm · 0.86mm/px · z∈[-43,+106]mm · 5 of 51 slices shown]
[im 1/51]
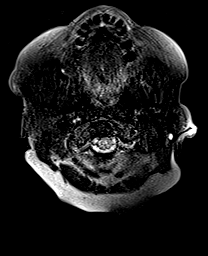
[im 13/51]
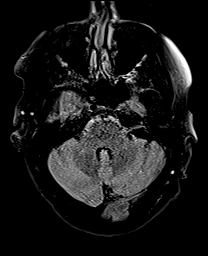
[im 26/51]
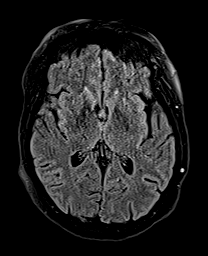
[im 38/51]
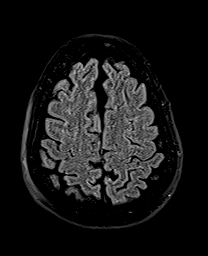
[im 51/51]
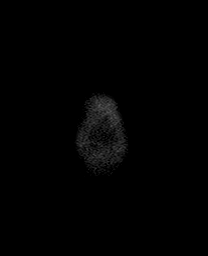

[Series 17: T1 · axial · 3.0mm · 0.45mm/px · z∈[-35,+113]mm · 5 of 51 slices shown (2 of 2)]
[im 1/51]
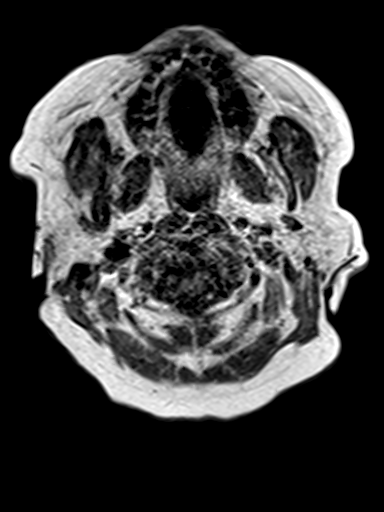
[im 13/51]
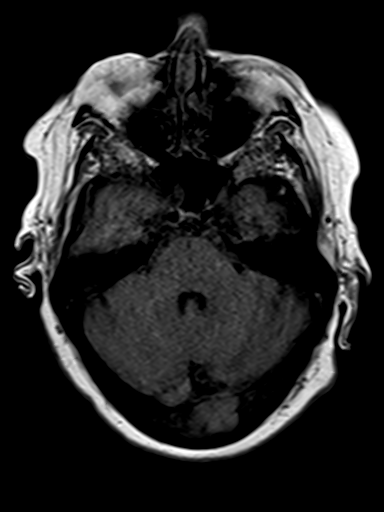
[im 26/51]
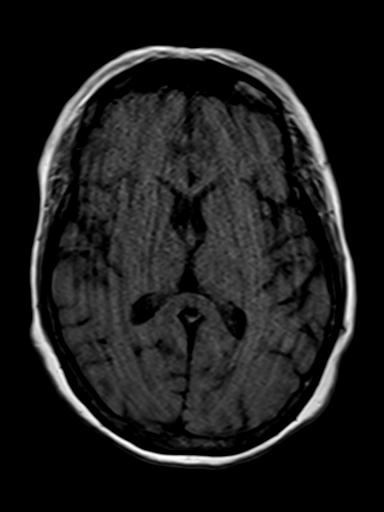
[im 38/51]
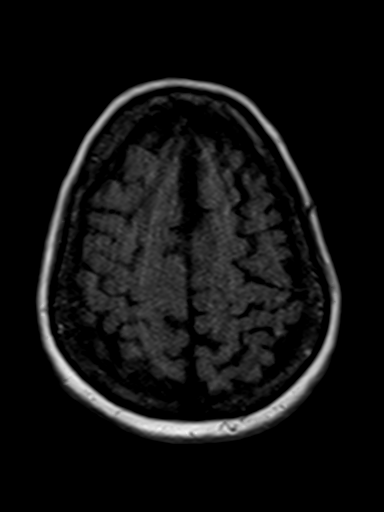
[im 51/51]
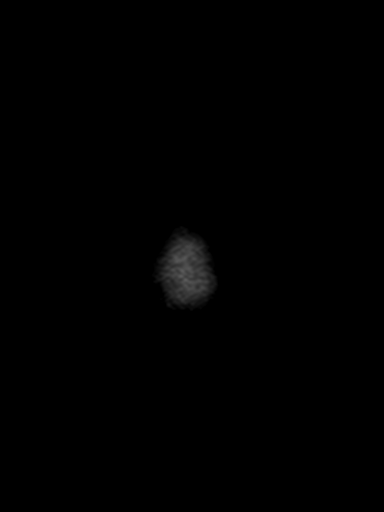

[Series 18: DWI · coronal · 5.0mm · 1.31mm/px · 6 of 56 slices shown (3 of 4)]
[im 1/56]
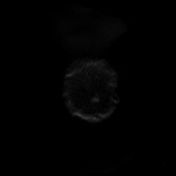
[im 12/56]
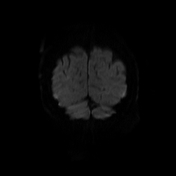
[im 23/56]
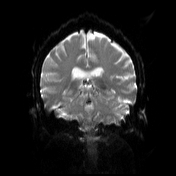
[im 34/56]
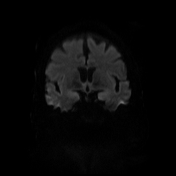
[im 45/56]
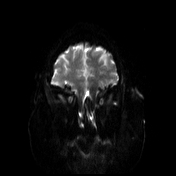
[im 56/56]
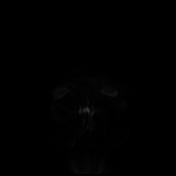

[Series 19: DWI · coronal · 5.0mm · 1.31mm/px · 3 of 28 slices shown (4 of 4)]
[im 1/28]
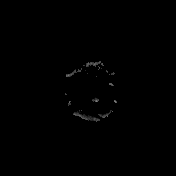
[im 14/28]
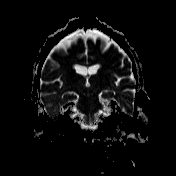
[im 28/28]
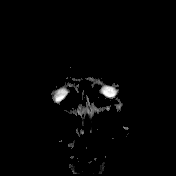

[Series 20: T2 · coronal · 5.0mm · 0.86mm/px · 3 of 26 slices shown (2 of 2)]
[im 1/26]
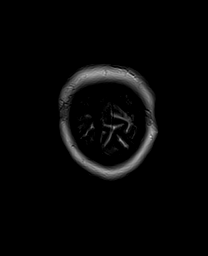
[im 13/26]
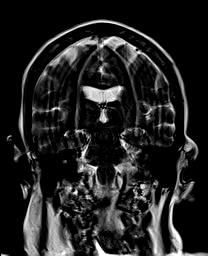
[im 26/26]
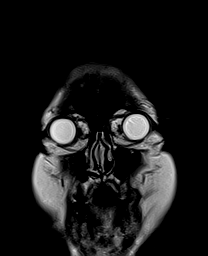

[48 of 48 positions shown; findings below may reference images not displayed]

FINDINGS: The study is mildly motion degraded.

Brain: There is no evidence of an acute infarct, intracranial
hemorrhage, mass, midline shift, or extra-axial fluid collection. A
partially empty sella is unchanged from the prior MRI. The
ventricles and sulci are normal. No significant white matter disease
is seen for age.

Vascular: Major intracranial vascular flow voids are preserved.

Skull and upper cervical spine: Unremarkable bone marrow signal para

Sinuses/Orbits: Bilateral cataract extraction. Minimal sphenoid
sinus mucosal thickening. Clear mastoid air cells.

Other: None.
IMPRESSION: Unchanged and largely unremarkable appearance of the brain. No acute
intracranial abnormality.

## 2021-11-27 ENCOUNTER — Encounter (HOSPITAL_COMMUNITY): Payer: Self-pay

## 2021-11-27 ENCOUNTER — Emergency Department (HOSPITAL_COMMUNITY): Payer: Medicare Other

## 2021-11-27 ENCOUNTER — Other Ambulatory Visit: Payer: Self-pay

## 2021-11-27 ENCOUNTER — Emergency Department (HOSPITAL_COMMUNITY)
Admission: EM | Admit: 2021-11-27 | Discharge: 2021-11-27 | Disposition: A | Discharge status: Home / Self Care | Payer: Medicare Other | Attending: Emergency Medicine | Admitting: Emergency Medicine

## 2021-11-27 DIAGNOSIS — Z7982 Long term (current) use of aspirin: Secondary | ICD-10-CM | POA: Diagnosis not present

## 2021-11-27 DIAGNOSIS — W109XXA Fall (on) (from) unspecified stairs and steps, initial encounter: Secondary | ICD-10-CM | POA: Diagnosis not present

## 2021-11-27 DIAGNOSIS — E1140 Type 2 diabetes mellitus with diabetic neuropathy, unspecified: Secondary | ICD-10-CM | POA: Insufficient documentation

## 2021-11-27 DIAGNOSIS — S0990XA Unspecified injury of head, initial encounter: Secondary | ICD-10-CM | POA: Insufficient documentation

## 2021-11-27 DIAGNOSIS — W19XXXA Unspecified fall, initial encounter: Secondary | ICD-10-CM

## 2021-11-27 DIAGNOSIS — M542 Cervicalgia: Secondary | ICD-10-CM | POA: Diagnosis not present

## 2021-11-27 MED ORDER — HYDROCODONE-ACETAMINOPHEN 5-325 MG PO TABS: 1.0000 | ORAL_TABLET | Freq: Once | ORAL | Status: DC

## 2021-11-27 NOTE — ED Triage Notes (Signed)
Pt states she fell Monday 4/3 at home, went to [facility] Tuesday 4/4 and was given a left wrist brace for her wrist injury. Pt states she fell yesterday 4/5, hit her head. Pt denies LOC, denies taking blood thinners.  ?

## 2021-11-27 NOTE — ED Provider Notes (Signed)
?[place] DEPT ?Provider Note ? ? ?CSN: [id number] ?Arrival date & time: 11/27/21  1650 ? ?  ? ?History ? ?Chief Complaint  ?Patient presents with  ? Fall  ? ? ?Valerie [name] is a 61 y.o. female. ? ?60 year old female with a past medical history of NSTEMI, DM, neuropathy presents to the ED via POV with a chief complaint of neck pain, headache status post fall.  Patient reports she suffered a fall on Monday, where she fell backwards after tripping herself, fell landing on the left wrist, states she was evaluated at urgent care and placed on a splint after she was diagnosed with a left hand fracture.  States yesterday she suffered a second fall where she tripped going down the steps and fell approximately 7-8 steps.  On today's visit, she is complaining of pain along the right side of her neck, right side of the posterior aspect of her head exacerbated with any kind of movement and rotation.  Has tried taking some Flexeril, Norco without any improvement in her symptoms.  Reports she did not lose consciousness after the fall, she is currently on no blood thinners.  She is also concerned as she has a prior history of CVA, states that she felt that her gait was not adequate after the fall.  No vision changes, no upper or lower extremity weaknesses, no chest pain, no shortness of breath. ? ?The history is provided by the patient and medical records.  ?Fall ?This is a new problem. The current episode started yesterday. The problem occurs rarely. Associated symptoms include headaches. Pertinent negatives include no chest pain, no abdominal pain and no shortness of breath. The symptoms are aggravated by walking. Nothing relieves the symptoms. She has tried rest and acetaminophen for the symptoms.  ? ?  ? ?Home Medications ?Prior to Admission medications   ?Medication Sig Start Date End Date Taking? Authorizing Provider  ?ALPRAZolam (XANAX) 1 MG tablet Take 1 tablet (1 mg total) by mouth 2 (two)  times daily. ?Patient taking differently: Take 1 mg by mouth 2 (two) times daily as needed for anxiety. 08/21/20   Eugenie Filler, MD  ?Ascorbic Acid (VITAMIN C) 1000 MG tablet Take 1,000 mg by mouth at bedtime.    [provider]  ?aspirin EC 81 MG tablet Take 81 mg by mouth daily. Swallow whole.    [provider]  ?Cholecalciferol (VITAMIN D-3) 1000 units CAPS Take 2,000 Units by mouth daily.    [provider]  ?Cyanocobalamin (VITAMIN B-12 PO) Take 1 tablet by mouth daily.    [provider]  ?docusate sodium (COLACE) 100 MG capsule Take 200 mg by mouth at bedtime.    [provider]  ?donepezil (ARICEPT) 10 MG tablet Take 10 mg by mouth daily. 10/30/19   [provider]  ?FLUoxetine (PROZAC) 10 MG capsule Take by mouth. 01/21/21   [provider]  ?FLUoxetine (PROZAC) 20 MG capsule Take by mouth. 04/10/21 05/10/21  [provider]  ?furosemide (LASIX) 20 MG tablet Take 1 tablet (20 mg total) by mouth daily. ?Patient taking differently: Take 20 mg by mouth daily as needed for fluid or edema. 07/23/20   Michael Boston, MD  ?HYDROcodone-acetaminophen (NORCO) 10-325 MG tablet Take 0.5-1 tablets by mouth every 6 (six) hours as needed for moderate pain or severe pain. 08/21/20   Eugenie Filler, MD  ?lamoTRIgine (LAMICTAL) 150 MG tablet Take 150 mg by mouth 2 (two) times daily.  02/27/18   [provider]  ?levocetirizine (XYZAL) 5 MG tablet Take 5 mg by mouth every evening.    [provider]  ?nitroGLYCERIN (NITROSTAT) 0.4 MG SL tablet Place 1 tablet (0.4 mg total) under the tongue every 5 (five) minutes as needed for chest pain. 03/04/21   [name], MD  ?omeprazole (PRILOSEC) 20 MG capsule Take 1 capsule (20 mg total) by mouth 2 (two) times daily before a meal. ?Patient taking differently: Take 20 mg by mouth daily. 06/12/20 10/10/20  Barb Merino, MD  ?oxybutynin (DITROPAN) 5 MG tablet Take 5 mg by mouth 2 (two)  times daily. 10/29/20   [provider]  ?phenazopyridine (PYRIDIUM) 100 MG tablet Take 1 tablet (100 mg total) by mouth 3 (three) times daily as needed (bladder spasm). 08/21/20   Eugenie Filler, MD  ?predniSONE (DELTASONE) 10 MG tablet Take 5 mg by mouth daily with breakfast. Take 5 mg by mouth every other day, alternating with 10 mg on opposing days    [provider]  ?Probiotic Product (PROBIOTIC DAILY PO) Take 1 tablet by mouth daily.    [provider]  ?rizatriptan (MAXALT-MLT) 10 MG disintegrating tablet Take 10 mg by mouth See admin instructions. Take one tablet (10 mg) by mouth daily as needed for migraine. May repeat in 2 hours if still needed    [provider]  ?rosuvastatin (CRESTOR) 10 MG tablet Take 1 tablet (10 mg total) by mouth daily. 01/10/21   [name], MD  ?traZODone (DESYREL) 100 MG tablet Take 2 tablets (200 mg total) by mouth at bedtime. 07/23/20   Earnstine Regal, PA-C  ?   ? ?Allergies    ?Carbamazepine and Sertraline hcl   ? ?Review of Systems   ?Review of Systems  ?Constitutional:  Negative for fever.  ?Respiratory:  Negative for shortness of breath.   ?Cardiovascular:  Negative for chest pain.  ?Gastrointestinal:  Negative for abdominal pain.  ?Musculoskeletal:  Positive for neck pain.  ?Skin:  Negative for wound.  ?Neurological:  Positive for headaches.  ?All other systems reviewed and are negative. ? ?Physical Exam ?Updated Vital Signs ?BP 131/76   Pulse 88   Temp 98.5 ?F (36.9 ?C) (Oral)   Resp 18   SpO2 95%  ?Physical Exam ?Vitals and nursing note reviewed.  ?Constitutional:   ?   Appearance: Normal appearance.  ?HENT:  ?   Head: Normocephalic and atraumatic.  ?   Mouth/Throat:  ?   Mouth: Mucous membranes are moist.  ?Eyes:  ?   Pupils: Pupils are equal, round, and reactive to light.  ?Cardiovascular:  ?   Rate and Rhythm: Normal rate.  ?Pulmonary:  ?   Effort: Pulmonary effort is normal.  ?   Breath sounds: No wheezing.   ?Abdominal:  ?   General: Abdomen is flat.  ?   Tenderness: There is no abdominal tenderness.  ?Musculoskeletal:  ?   Cervical back: Normal range of motion and neck supple.  ?Skin: ?   General: Skin is warm and dry.  ?Neurological:  ?   Mental Status: She is alert and oriented to person, place, and time.  ?   Comments: Alert, oriented, thought content appropriate. Speech fluent without evidence of aphasia. Able to follow 2 step commands without difficulty.  ?Cranial Nerves:  ?II:  Peripheral visual fields grossly normal, pupils, round, reactive to light ?III,IV, VI: ptosis not present, extra-ocular motions intact bilaterally  ?V,VII: smile symmetric, facial light touch sensation equal ?VIII: hearing grossly normal  bilaterally  ?IX,X: midline uvula rise  ?XI: bilateral shoulder shrug equal and strong ?XII: midline tongue extension  ?Motor:  ?5/5 in upper and lower extremities bilaterally including strong and equal grip strength and dorsiflexion/plantar flexion ?Sensory: light touch normal in all extremities.  ?Cerebellar: normal finger-to-nose with bilateral upper extremities, pronator drift negative ?Gait: not evaluated ?  ? ? ?ED Results / Procedures / Treatments   ?Labs ?(all labs ordered are listed, but only abnormal results are displayed) ?Labs Reviewed - No data to display ? ?EKG ?None ? ?Radiology ?CT HEAD WO CONTRAST (5MM) ? ?Result Date: 11/27/2021 ?CLINICAL DATA:  Fall from standing with head trauma EXAM: CT HEAD WITHOUT CONTRAST CT MAXILLOFACIAL WITHOUT CONTRAST CT CERVICAL SPINE WITHOUT CONTRAST TECHNIQUE: Multidetector CT imaging of the head, cervical spine, and maxillofacial structures were performed using the standard protocol without intravenous contrast. Multiplanar CT image reconstructions of the cervical spine and maxillofacial structures were also generated. RADIATION DOSE REDUCTION: This exam was performed according to the departmental dose-optimization program which includes automated exposure  control, adjustment of the mA and/or kV according to patient size and/or use of iterative reconstruction technique. COMPARISON:  Head CT August 09, 2020. FINDINGS: CT HEAD FINDINGS Brain: No evidence of acute inf

## 2021-11-27 NOTE — Discharge Instructions (Signed)
The CT of your head, neck, face were within normal limits today. ? ?You may continue to apply heat, ice to the area to help with your symptoms. ? ?Please follow-up with your primary care physician as needed. ?

## 2021-11-27 NOTE — ED Notes (Signed)
An After Visit Summary was printed and given to the patient. ?Discharge instructions given and no further questions at this time.  ?Pt states she has a friend taking her home.  ?

## 2022-01-27 ENCOUNTER — Other Ambulatory Visit: Payer: Self-pay | Admitting: Obstetrics and Gynecology

## 2022-01-27 DIAGNOSIS — Z1231 Encounter for screening mammogram for malignant neoplasm of breast: Secondary | ICD-10-CM

## 2022-03-11 DIAGNOSIS — Z1231 Encounter for screening mammogram for malignant neoplasm of breast: Secondary | ICD-10-CM

## 2022-03-12 ENCOUNTER — Ambulatory Visit: Payer: Medicare Other

## 2022-03-13 ENCOUNTER — Ambulatory Visit
Admission: RE | Admit: 2022-03-13 | Discharge: 2022-03-13 | Disposition: A | Discharge status: Home / Self Care | Payer: Medicare Other | Source: Ambulatory Visit | Attending: Obstetrics and Gynecology | Admitting: Obstetrics and Gynecology

## 2022-03-13 DIAGNOSIS — Z1231 Encounter for screening mammogram for malignant neoplasm of breast: Secondary | ICD-10-CM

## 2022-04-16 ENCOUNTER — Ambulatory Visit: Payer: Medicare Other | Admitting: Internal Medicine

## 2022-04-16 ENCOUNTER — Encounter: Payer: Self-pay | Admitting: Internal Medicine

## 2022-04-16 VITALS — BP 120/80 | HR 74 | Ht 62.0 in | Wt 207.8 lb

## 2022-04-16 DIAGNOSIS — E213 Hyperparathyroidism, unspecified: Secondary | ICD-10-CM | POA: Diagnosis not present

## 2022-04-16 DIAGNOSIS — E042 Nontoxic multinodular goiter: Secondary | ICD-10-CM | POA: Diagnosis not present

## 2022-04-16 NOTE — Progress Notes (Signed)
Patient ID: Efrat Zuidema, female   DOB: October 08, 1960, 61 y.o.   MRN: [id number]    HPI  Cayman Brogden is a 61 y.o.-year-old female, initially referred by Dr. Erlinda Hong, returning for follow-up for primary hyperparathyroidism and thyroid nodules.  Last visit 1 year ago  Interim history: Before last visit, she had left inferior parathyroidectomy by Dr. Harlow Asa on 01/09/2021.  She felt better after surgery. However, she still has significant fatigue, joint pains, muscle aches. She has psoriatic arthritis >> prev. On  Enbrel, MTX, higher Prednisone (now 5 mg daily). Mostly shoulders, knees, hands. 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 She has a lot of weakness in legs >> fell today >> now has a walker - borrowed it from a neighbor.  She also has more tremors lately. She started Aricept this Spring and Namenda 4 weeks ago.  Reviewed and addended history: At our first visit, patient presented after hospital admission for metabolic encephalopathy in the setting of hypercalcemia after having colovesical fistula repair in 06/2020. She has another fistula >> will have repair Sx in 09/2019. Before her first Sx, she was on TPN for 6 weeks.  Calcium levels were elevated in 10/2019 when she had an AMI and a stroke.  During her recent admission, calcium level was found to be as high as 12.8.  The PTH was not suppressed, at 73.   While in the hospital, she was given Zolendronic acid x1, calcitonin, and hydrated and her calcium returned to normal and then decreased to even the lower limit of normal.  Of note, calcitriol level, vitamin D, TSH, ACE, PTHrp, 24-hour urine calcium and spot Ca/Cr ratio were all normal.  Phosphorus was low (lowest 1.0).   I reviewed pt's pertinent labs: Lab Results  Component Value Date   PTH 84 (H) 04/15/2021   PTH Comment 04/15/2021   PTH 113 (H) 12/03/2020   PTH Comment 12/03/2020   PTH 73 (H) 08/09/2020   PTH Comment 08/09/2020   CALCIUM 8.5 (L) 04/15/2021    CALCIUM 9.4 01/07/2021   CALCIUM 10.0 12/03/2020   CALCIUM 7.9 (L) 08/19/2020   CALCIUM 7.9 (L) 08/18/2020   CALCIUM 8.2 (L) 08/16/2020   CALCIUM 8.3 (L) 08/15/2020   CALCIUM 8.9 08/14/2020   CALCIUM 9.5 08/13/2020   CALCIUM 10.3 08/12/2020   CALCIUM 11.7 (H) 08/11/2020   CALCIUM 11.9 (H) 08/10/2020   CALCIUM 12.0 (H) 08/09/2020   CALCIUM 12.8 (H) 08/09/2020   CALCIUM 11.4 (H) 07/21/2020   CALCIUM 11.4 (H) 07/20/2020   CALCIUM 11.7 (H) 07/19/2020   CALCIUM 11.6 (H) 07/19/2020   CALCIUM 10.7 (H) 07/18/2020   CALCIUM 11.3 (H) 07/17/2020   CALCIUM 10.1 07/08/2020   CALCIUM 9.3 06/13/2020   CALCIUM 9.8 06/10/2020   CALCIUM 10.1 06/09/2020   CALCIUM 10.4 (H) 06/08/2020   CALCIUM 10.7 (H) 06/07/2020   CALCIUM 10.5 (H) 06/06/2020   CALCIUM 10.8 (H) 06/05/2020   CALCIUM 11.5 (H) 06/03/2020   CALCIUM 11.2 (H) 05/15/2020   CALCIUM 12.9 (H) 05/14/2020   CALCIUM 9.8 02/20/2020   CALCIUM 10.2 11/14/2019   CALCIUM 10.4 (H) 11/13/2019  02/04/2021: Calcium 9.0, PTH 72  Lab Results  Component Value Date   PHOS 3.0 08/18/2020   PHOS 2.2 (L) 08/16/2020   PHOS 1.9 (L) 08/15/2020   PHOS 1.8 (L) 08/14/2020   PHOS 1.0 (LL) 08/13/2020   PHOS 1.1 (L) 08/12/2020   PHOS 2.3 (L) 08/11/2020   PHOS 2.7 08/09/2020   PHOS 1.9 (L) 07/21/2020  PHOS 3.1 07/18/2020   Component     Latest Ref Rng & Units 08/16/2020  Magnesium     1.7 - 2.4 mg/dL 2.3   Component     Latest Ref Rng & Units 08/09/2020 08/10/2020 08/11/2020 08/14/2020  Calcium, Ur     Not Estab. mg/dL  6.0 6.2   Calcium/Creat.Ratio     29 - 442 mg/g creat  154    Creatinine, Urine     Not Estab. mg/dL  39.0    Calcium, 24 hour urine     0 - 320 mg/24 hr   143   Vit D, 1,25-Dihydroxy     19.9 - 79.3 pg/mL 33.2     PTH-related peptide     pmol/L   <2.0   Angiotensin-Converting Enzyme     14 - 82 U/L    41   FE Ca = 0.02  A thyroid ultrasound (01/05/2020) showed 4 nodules, but none of them reported as possible parathyroid  adenomas: THYROID ULTRASOUND  INDICATION: Nontoxic single thyroid nodule thyroid nodules seen on carotid ultrasound performed at doctor's office  COMPARISON: None  ISTHMUS:  - Size: 0.4 cm.   RIGHT LOBE:  - Size: 3.6 x 1.2 x 1.3 cm.  - Echogenicity: Normal.   LEFT LOBE:  - Size: 3.9 x 2.4 x 2.7 cm.  - Echogenicity: Normal.   NODULES:   - Nodule 1:  -- Size: 0.7 x 0.6 x 0.7 cm (long x AP x trans).  -- Location: Right mid.  -- Composition: spongiform (0)  -- Echogenicity: hypoechoic (2)  -- Shape: wider-than-tall (0)  -- Margins: ill-defined (0)  -- Echogenic foci: none (0)  -- ACR TI-RADS total points and risk category: 2 Points - TR2.   - Nodule 2:  -- Size: 2.4 x 1.6 x 2 cm (long x AP x trans).  -- Location: Left mid.  -- Composition: solid or almost completely solid (2)  -- Echogenicity: hypoechoic (2)  -- Shape: wider-than-tall (0)  -- Margins: ill-defined (0)  -- Echogenic foci: none (0)  -- ACR TI-RADS total points and risk category: 4 Points - TR4.   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 [facility] System Category II  - Nodule 3:  -- Size: 0.6 x 0.4 x 0.6 cm (long x AP x trans).  -- Location: Left lower.  -- Composition: cystic or almost completely cystic (0)   - Nodule 4:  -- Size: 0.8 x 0.6 x 0.7 cm (long x AP x trans).  -- Location: Left lower.  -- Composition: solid or almost completely solid (2)  -- Echogenicity: isoechoic (1)  -- Shape: wider-than-tall (0)  -- Margins: smooth (0)  -- Echogenic foci: none (0)  -- ACR TI-RADS total points and risk category: 3 Points - TR3.   Technetium sestamibi scan (11/23/2020): Washout of radiotracer activity from the thyroid gland at 2 hours. Small focus faint activity inferior to the LEFT lobe of thyroid gland is noted on planar imaging.   SPECT imaging of the neck and  chest demonstrate a small focus activity which is faintly above the residual thyroid activit localizing to the lower pole of the LEFT lobe of thyroid gland.   Inferior to the LEFT lower pole activity, there is a second small focus of activity which is very faint.   IMPRESSION: 1. No convincing evidence parathyroid adenoma adenoma. 2. Focal activity in  the lower pole of the LEFT lobe of thyroid gland and small focus inferior to LEFT lobe of thyroid gland would be most likely location of parathyroid adenomas. Consider CT of the neck with contrast for further evaluation.  4DCT (12/17/2020):  Possible left inferior parathyroid adenoma, measuring 9 mm  Left inferior parathyroidectomy (01/09/2021): final pathology: 0.465 mg hypercellular parathyroid tissue consistent with adenoma (1.0 x 0.8 x 0.7 cm)  Thyroid U/S (04/17/2021): Parenchymal Echotexture: Moderately heterogeneous Isthmus: 0.4 cm Right lobe: 3.9 x 1.2 x 1.9 cm Left lobe: 3.6 x 1.2 x 1.7 cm _________________________________________________________   Nodule 1: 0.7 cm spongiform nodule in the right mid thyroid lobe does not meet criteria for FNA or surveillance.  _________________________________________________________   Nodule # 2: Location: Left; mid Maximum size: 1.3 cm; Other 2 dimensions: 1.1 x 0.8 cm Composition: solid/almost completely solid (2) Echogenicity: isoechoic (1) Given size (<1.4 cm) and appearance, this nodule does NOT meet TI-RADS criteria for biopsy or dedicated follow-up.  _________________________________________________________   Nodule # 3: Location: Left; inferior Maximum size: 1.0 cm; Other 2 dimensions: 0.9 x 0.5 cm Composition: solid/almost completely solid (2) Echogenicity: hypoechoic (2)  *Given size (>/= 1 - 1.4 cm) and appearance, a follow-up ultrasound in 1 year should be considered based on TI-RADS criteria.  ________________________________________________________    IMPRESSION: Nodule 3 (TI-RADS 4) located in the inferior left thyroid lobe meets criteria for imaging follow-up. Annual ultrasound surveillance is recommended until 5 years of stability is documented.  She is on B complex.  No previous DXA scan reports available for review. No h/o OP.  No fractures or falls.   + h/o kidney stone x1 "years ago".  +  CKD. Last BUN/Cr: Lab Results  Component Value Date   BUN 32 (H) 01/07/2021   BUN 11 08/19/2020   CREATININE 1.40 (H) 01/07/2021   CREATININE 1.36 (H) 08/19/2020   Pt is not on HCTZ.  No h/o vitamin D deficiency. Reviewed vit D levels: Lab Results  Component Value Date   VD25OH 50.6 12/03/2020   VD25OH 68.85 08/09/2020   Pt takes vitamin D 1000 units daily.  Pt does not have a FH of hypercalcemia, pituitary tumors, thyroid cancer, or osteoporosis.   Pt. also has a history of CAD, history of NSTEMI.  She has a history of scurvy.  Also has a history of peripheral neuropathy, psoriatic arthritis, prediabetes, hyperlipidemia. She is on B12. She sees neurology for PN.  Tried Lyrica and Neurontin but this did not work well for her in the past.  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   Stroke ([place])    ?history; MRI normal   Past Surgical History:  Procedure Laterality Date   ABDOMINAL HYSTERECTOMY     APPENDECTOMY     CHOLECYSTECTOMY     CYSTOSCOPY WITH STENT PLACEMENT N/A 07/17/2020   Procedure: CYSTOSCOPY  WITH BILATERAL FIREFLY INJECTION;  Surgeon: Raynelle Bring, MD;  Location: [facility];  Service: Urology;  Laterality: N/A;   FLEXIBLE SIGMOIDOSCOPY N/A 05/17/2020   Procedure: FLEXIBLE SIGMOIDOSCOPY;  Surgeon: Carol Ada, MD;  Location: [facility];  Service: Endoscopy;  Laterality: N/A;   FOOT SURGERY Right    IR FLUORO GUIDE CV LINE RIGHT  08/19/2020   IR RADIOLOGIST EVAL & MGMT  09/10/2020   IR RADIOLOGIST EVAL & MGMT  10/10/2020   IR REMOVAL TUN CV CATH W/O FL  09/16/2020   IR SINUS/FIST TUBE CHK-NON GI  10/24/2020   IR SINUS/FIST TUBE CHK-NON GI  10/31/2020   IR US GUIDE VASC ACCESS RIGHT  08/19/2020   LEFT HEART CATH AND CORONARY ANGIOGRAPHY N/A 11/14/2019   Procedure: LEFT HEART CATH AND CORONARY ANGIOGRAPHY;  Surgeon: Martinique, Peter M, MD;  Location: [place] [facility];  Service: Cardiovascular;  Laterality: N/A;   PARATHYROID EXPLORATION N/A 01/09/2021   Procedure: POSSIBLE NECK EXPLORATION;  Surgeon: Armandina Gemma, MD;  Location: [facility];  Service: General;  Laterality: N/A;   PARATHYROIDECTOMY Left 01/09/2021   Procedure: LEFT INFERIOR PARATHYROIDECTOMY;  Surgeon: Armandina Gemma, MD;  Location: [facility];  Service: General;  Laterality: Left;   PROCTOSCOPY N/A 07/17/2020   Procedure: RIGID PROCTOSCOPY;  Surgeon: Michael Boston, MD;  Location: [facility];  Service: General;  Laterality: N/A;   TONSILLECTOMY     TOTAL HIP ARTHROPLASTY Bilateral    Social History   Socioeconomic History   Marital status: Divorced    Spouse name: Not on file   Number of children: 2   Years of education: Not on file   Highest education level: Not on file  Occupational History   Occupation: Disabled  Tobacco Use   Smoking status: Every Day    Packs/day: 0.50    Types: Cigarettes   Smokeless tobacco: Never  Vaping Use   Vaping Use: Never used  Substance and Sexual Activity   Alcohol use: Not Currently   Drug use: Never   Sexual activity: Not on file  Other Topics Concern   Not on file  Social History  Narrative   Not on file   Social Determinants of Health   Financial Resource Strain: Not on file  Food Insecurity: Not on file  Transportation Needs: Not on file  Physical Activity: Not on file  Stress: Not on file  Social Connections: Not on file  Intimate Partner Violence: Not on file   Current Outpatient Medications on File Prior to Visit  Medication Sig Dispense Refill   ALPRAZolam (XANAX) 1 MG tablet Take 1 tablet (1 mg total) by mouth 2 (two) times daily. (Patient taking differently: Take 1 mg by mouth 2 (two) times daily as needed for anxiety.) 20 tablet 0   Ascorbic Acid (VITAMIN C) 1000 MG tablet Take 1,000 mg by mouth at bedtime.     aspirin EC 81 MG tablet Take 81 mg by mouth daily. Swallow whole.     Cholecalciferol (VITAMIN D-3) 1000 units CAPS Take 2,000 Units by mouth daily.     Cyanocobalamin (VITAMIN B-12 PO) Take 1 tablet by mouth daily.     docusate sodium (COLACE) 100 MG capsule Take 200 mg by mouth at bedtime.     donepezil (ARICEPT) 10 MG tablet Take 10 mg by mouth daily.     FLUoxetine (PROZAC) 10 MG capsule Take by mouth.     FLUoxetine (PROZAC) 20 MG capsule Take by mouth.     furosemide (LASIX) 20 MG tablet Take 1 tablet (20  mg total) by mouth daily. (Patient taking differently: Take 20 mg by mouth daily as needed for fluid or edema.) 30 tablet 1   HYDROcodone-acetaminophen (NORCO) 10-325 MG tablet Take 0.5-1 tablets by mouth every 6 (six) hours as needed for moderate pain or severe pain. 20 tablet 0   lamoTRIgine (LAMICTAL) 150 MG tablet Take 150 mg by mouth 2 (two) times daily.   0   levocetirizine (XYZAL) 5 MG tablet Take 5 mg by mouth every evening.     nitroGLYCERIN (NITROSTAT) 0.4 MG SL tablet Place 1 tablet (0.4 mg total) under the tongue every 5 (five) minutes as needed for chest pain. 30 tablet 2   omeprazole (PRILOSEC) 20 MG capsule Take 1 capsule (20 mg total) by mouth 2 (two) times daily before a meal. (Patient taking differently: Take 20 mg by mouth  daily.) 60 capsule 3   oxybutynin (DITROPAN) 5 MG tablet Take 5 mg by mouth 2 (two) times daily.     phenazopyridine (PYRIDIUM) 100 MG tablet Take 1 tablet (100 mg total) by mouth 3 (three) times daily as needed (bladder spasm). 10 tablet 0   predniSONE (DELTASONE) 10 MG tablet Take 5 mg by mouth daily with breakfast. Take 5 mg by mouth every other day, alternating with 10 mg on opposing days     Probiotic Product (PROBIOTIC DAILY PO) Take 1 tablet by mouth daily.     rizatriptan (MAXALT-MLT) 10 MG disintegrating tablet Take 10 mg by mouth See admin instructions. Take one tablet (10 mg) by mouth daily as needed for migraine. May repeat in 2 hours if still needed     rosuvastatin (CRESTOR) 10 MG tablet Take 1 tablet (10 mg total) by mouth daily. 90 tablet 3   traZODone (DESYREL) 100 MG tablet Take 2 tablets (200 mg total) by mouth at bedtime. 60 tablet 0   No current facility-administered medications on file prior to visit.   Allergies  Allergen Reactions   Carbamazepine Other (See Comments)    Parkinsons like symptoms tremors   Sertraline Hcl Other (See Comments)    Unknown reaction   Family History  Problem Relation Age of Onset   Breast cancer Neg Hx     PE: BP 120/80 (BP Location: Right Arm, Patient Position: Sitting, Cuff Size: Normal)   Pulse 74   Ht '5\' 2"'$  (1.575 m)   Wt 207 lb 12.8 oz (94.3 kg)   SpO2 92%   BMI 38.01 kg/m  Wt Readings from Last 3 Encounters:  04/16/22 207 lb 12.8 oz (94.3 kg)  04/15/21 199 lb 9.6 oz (90.5 kg)  01/10/21 205 lb 9.6 oz (93.3 kg)   Constitutional: overweight, in NAD. No kyphosis. Walks with a cane. Eyes: EOMI, no exophthalmos ENT: moist mucous membranes, no thyromegaly, no nodules palpated, no cervical lymphadenopathy Cardiovascular: RRR, No MRG  Respiratory: CTA B Musculoskeletal: no deformities Skin: moist, warm, no rashes, + bruises on forearms Neurological: + tremor with outstretched hands  Assessment: 1.  Primary  hyperparathyroidism  2. Thyroid nodules  Plan: Patient with several instances of elevated calcium in the past, with the highest being 12.8-12.9.  A corresponding intact PTH level was also high, at 72.  She also has a history of vitamin D deficiency, but recent levels have been normal. -She has apparent complications from hypercalcemia: No osteoporosis, fractures, but apparently had a kidney stone years ago. -We checked her 24-hour urine calcium and this was normal, with a fractional excretion of calcium of 0.02.  She had extensive  evaluation for her elevated calcium in the past including: normal calcitriol level, PTH rp, ACE, SPEP/UPEP magnesium, and phosphorus levels were normal.  Did have a history of CKD but this was mild and not severe enough to explain her significantly elevated calcium.  I referred her to Dr. Harlow Asa and she had technetium sestamibi scan (11/23/2020) which did not show a clear parathyroid lesion.  There was the possibility of a left inferior adenoma.  She also had a 4 D CT (12/17/2020) which also showed a possible left inferior parathyroid adenoma measuring 9 mm.  She had left inferior parathyroidectomy on 01/09/2021.  Final pathology showed a 0.465 g parathyroid adenoma measuring 1.2 cm in the largest dimension. -After the surgery, her PTH was still elevated, however, calcium was actually low at the time of the measurements so this was most likely a physiologic, not pathologic, response of the parathyroid glands  to hypocalcemia. -Her vitamin D level was normal in 11/2020.  We will recheck this today.  She continues on 1000 units vitamin D daily. -Today's visit, we will check a PTH and ionized calcium and also a vitamin D level.   -I will see her back in 1 year  2. Thyroid nodules -Patient with a history of 3 subcentimeter and 1 supra centimeter thyroid nodule, of which the largest nodule was biopsied with benign results in 2021. -We checked another thyroid ultrasound last year and  this showed that only one of the nodules required follow-up.  This was hypoechoic, measuring 1 cm in the largest dimension. -She denies neck compression symptoms -Last TSH was normal in 2021 - will check today -At this visit, in the absence of neck compression symptoms or a palpable nodule, we decided to wait until next year to repeat her thyroid ultrasound  Component     Latest Ref Rng 04/16/2022  TSH     0.35 - 5.50 uIU/mL 1.76   PTH, Intact     15 - 65 pg/mL 47   VITD     30.00 - 100.00 ng/mL 41.30   Calcium Ionized     4.7 - 5.5 mg/dL 4.9    All the labs are normal.  Philemon Kingdom, MD PhD [facility] Endocr[facility]

## 2022-04-16 NOTE — Patient Instructions (Signed)
Please stop at the lab.  Please come back for a follow-up appointment in 1 year.  

## 2022-04-17 LAB — PARATHYROID HORMONE, INTACT (NO CA): PTH: 47 pg/mL (ref 15–65)

## 2022-04-17 LAB — VITAMIN D 25 HYDROXY (VIT D DEFICIENCY, FRACTURES): VITD: 41.3 ng/mL (ref 30.00–100.00)

## 2022-04-17 LAB — TSH: TSH: 1.76 u[IU]/mL (ref 0.35–5.50)

## 2022-04-17 LAB — CALCIUM, IONIZED: Calcium, Ion: 4.9 mg/dL (ref 4.7–5.5)

## 2022-07-21 ENCOUNTER — Other Ambulatory Visit: Payer: Self-pay | Admitting: Specialist

## 2022-07-21 DIAGNOSIS — G3184 Mild cognitive impairment, so stated: Secondary | ICD-10-CM

## 2022-07-21 DIAGNOSIS — Z8673 Personal history of transient ischemic attack (TIA), and cerebral infarction without residual deficits: Secondary | ICD-10-CM

## 2022-07-21 DIAGNOSIS — G40209 Localization-related (focal) (partial) symptomatic epilepsy and epileptic syndromes with complex partial seizures, not intractable, without status epilepticus: Secondary | ICD-10-CM

## 2022-07-25 ENCOUNTER — Ambulatory Visit
Admission: RE | Admit: 2022-07-25 | Discharge: 2022-07-25 | Disposition: A | Discharge status: Home / Self Care | Payer: Medicare Other | Source: Ambulatory Visit | Attending: Specialist | Admitting: Specialist

## 2022-07-25 DIAGNOSIS — G40209 Localization-related (focal) (partial) symptomatic epilepsy and epileptic syndromes with complex partial seizures, not intractable, without status epilepticus: Secondary | ICD-10-CM

## 2022-07-25 DIAGNOSIS — G3184 Mild cognitive impairment, so stated: Secondary | ICD-10-CM

## 2022-07-25 DIAGNOSIS — Z8673 Personal history of transient ischemic attack (TIA), and cerebral infarction without residual deficits: Secondary | ICD-10-CM

## 2022-08-23 ENCOUNTER — Other Ambulatory Visit: Payer: Self-pay

## 2022-08-23 ENCOUNTER — Emergency Department (HOSPITAL_COMMUNITY): Payer: Medicare Other

## 2022-08-23 ENCOUNTER — Encounter (HOSPITAL_COMMUNITY): Payer: Self-pay

## 2022-08-23 ENCOUNTER — Emergency Department (HOSPITAL_COMMUNITY)
Admission: EM | Admit: 2022-08-23 | Discharge: 2022-08-23 | Disposition: A | Discharge status: Home / Self Care | Payer: Medicare Other | Attending: Emergency Medicine | Admitting: Emergency Medicine

## 2022-08-23 DIAGNOSIS — Z1152 Encounter for screening for COVID-19: Secondary | ICD-10-CM | POA: Diagnosis not present

## 2022-08-23 DIAGNOSIS — E1122 Type 2 diabetes mellitus with diabetic chronic kidney disease: Secondary | ICD-10-CM | POA: Diagnosis not present

## 2022-08-23 DIAGNOSIS — N183 Chronic kidney disease, stage 3 unspecified: Secondary | ICD-10-CM | POA: Insufficient documentation

## 2022-08-23 DIAGNOSIS — W010XXA Fall on same level from slipping, tripping and stumbling without subsequent striking against object, initial encounter: Secondary | ICD-10-CM | POA: Insufficient documentation

## 2022-08-23 DIAGNOSIS — W19XXXA Unspecified fall, initial encounter: Secondary | ICD-10-CM

## 2022-08-23 DIAGNOSIS — R42 Dizziness and giddiness: Secondary | ICD-10-CM | POA: Insufficient documentation

## 2022-08-23 LAB — URINALYSIS, ROUTINE W REFLEX MICROSCOPIC
Bilirubin Urine: NEGATIVE
Glucose, UA: NEGATIVE mg/dL
Hgb urine dipstick: NEGATIVE
Ketones, ur: NEGATIVE mg/dL
Nitrite: POSITIVE — AB
Protein, ur: NEGATIVE mg/dL
Specific Gravity, Urine: 1.009 (ref 1.005–1.030)
pH: 5 (ref 5.0–8.0)

## 2022-08-23 LAB — COMPREHENSIVE METABOLIC PANEL
ALT: 11 U/L (ref 0–44)
AST: 17 U/L (ref 15–41)
Albumin: 3.8 g/dL (ref 3.5–5.0)
Alkaline Phosphatase: 63 U/L (ref 38–126)
Anion gap: 12 (ref 5–15)
BUN: 13 mg/dL (ref 8–23)
CO2: 23 mmol/L (ref 22–32)
Calcium: 8.5 mg/dL — ABNORMAL LOW (ref 8.9–10.3)
Chloride: 104 mmol/L (ref 98–111)
Creatinine, Ser: 1.59 mg/dL — ABNORMAL HIGH (ref 0.44–1.00)
GFR, Estimated: 37 mL/min — ABNORMAL LOW (ref >60)
Glucose, Bld: 159 mg/dL — ABNORMAL HIGH (ref 70–99)
Potassium: 3.1 mmol/L — ABNORMAL LOW (ref 3.5–5.1)
Sodium: 139 mmol/L (ref 135–145)
Total Bilirubin: 0.4 mg/dL (ref 0.3–1.2)
Total Protein: 6.3 g/dL — ABNORMAL LOW (ref 6.5–8.1)

## 2022-08-23 LAB — CBC WITH DIFFERENTIAL/PLATELET
Abs Immature Granulocytes: 0.1 10*3/uL — ABNORMAL HIGH (ref 0.00–0.07)
Basophils Absolute: 0.1 10*3/uL (ref 0.0–0.1)
Basophils Relative: 1 %
Eosinophils Absolute: 0.2 10*3/uL (ref 0.0–0.5)
Eosinophils Relative: 2 %
HCT: 42.1 % (ref 36.0–46.0)
Hemoglobin: 13.6 g/dL (ref 12.0–15.0)
Immature Granulocytes: 1 %
Lymphocytes Relative: 6 %
Lymphs Abs: 0.6 10*3/uL — ABNORMAL LOW (ref 0.7–4.0)
MCH: 33.6 pg (ref 26.0–34.0)
MCHC: 32.3 g/dL (ref 30.0–36.0)
MCV: 104 fL — ABNORMAL HIGH (ref 80.0–100.0)
Monocytes Absolute: 0.5 10*3/uL (ref 0.1–1.0)
Monocytes Relative: 4 %
Neutro Abs: 9.3 10*3/uL — ABNORMAL HIGH (ref 1.7–7.7)
Neutrophils Relative %: 86 %
Platelets: 196 10*3/uL (ref 150–400)
RBC: 4.05 MIL/uL (ref 3.87–5.11)
RDW: 14.4 % (ref 11.5–15.5)
WBC: 10.8 10*3/uL — ABNORMAL HIGH (ref 4.0–10.5)
nRBC: 0 % (ref 0.0–0.2)

## 2022-08-23 LAB — RESP PANEL BY RT-PCR (RSV, FLU A&B, COVID)  RVPGX2
Influenza A by PCR: NEGATIVE
Influenza B by PCR: NEGATIVE
Resp Syncytial Virus by PCR: NEGATIVE
SARS Coronavirus 2 by RT PCR: NEGATIVE

## 2022-08-23 LAB — MAGNESIUM: Magnesium: 1.8 mg/dL (ref 1.7–2.4)

## 2022-08-23 LAB — CBG MONITORING, ED: Glucose-Capillary: 173 mg/dL — ABNORMAL HIGH (ref 70–99)

## 2022-08-23 MED ORDER — POTASSIUM CHLORIDE CRYS ER 20 MEQ PO TBCR
40.0000 meq | EXTENDED_RELEASE_TABLET | Freq: Once | ORAL | Status: AC
  Administered 2022-08-23: 40 meq via ORAL
  Filled 2022-08-23: qty 2

## 2022-08-23 MED ORDER — LORAZEPAM 0.5 MG PO TABS: 0.5000 mg | ORAL_TABLET | ORAL | Status: DC | PRN

## 2022-08-23 MED ORDER — LORAZEPAM 2 MG/ML IJ SOLN: 1.0000 mg | INTRAMUSCULAR | Status: DC | PRN

## 2022-08-23 MED ORDER — GADOBUTROL 1 MMOL/ML IV SOLN
10.0000 mL | Freq: Once | INTRAVENOUS | Status: AC | PRN
  Administered 2022-08-23: 10 mL via INTRAVENOUS

## 2022-08-23 MED ORDER — FOSFOMYCIN TROMETHAMINE 3 G PO PACK
3.0000 g | PACK | Freq: Once | ORAL | Status: AC
Start: 2022-08-23 — End: 2022-08-23
  Administered 2022-08-23: 3 g via ORAL
  Filled 2022-08-23: qty 3

## 2022-08-23 MED ORDER — LORAZEPAM 2 MG/ML IJ SOLN: 0.5000 mg | INTRAMUSCULAR | Status: DC | PRN

## 2022-08-23 MED ORDER — MECLIZINE HCL 25 MG PO TABS
25.0000 mg | ORAL_TABLET | Freq: Once | ORAL | Status: AC
  Administered 2022-08-23: 25 mg via ORAL
  Filled 2022-08-23: qty 1

## 2022-08-23 NOTE — Discharge Instructions (Addendum)
Please follow-up with your PCP regarding your symptoms, or establish care with a new PCP. Your MRI imaging was negative for acute stroke.

## 2022-08-23 NOTE — ED Notes (Signed)
Pt. Is unable to provide a urine sample at this time

## 2022-08-23 NOTE — ED Provider Triage Note (Signed)
Emergency Medicine Provider Triage Evaluation Note  Valerie [name] , a 61 y.o. female  was evaluated in triage.  Pt complains of a fall which occurred yesterday afternoon at approximately 3 PM.  Patient states she believes she tripped falling forward hitting her head on the hardwood floor.  She denies losing consciousness.  She denies using blood thinners.  She states that at the beginning of December she was told she may have a early Alzheimer's.  She states that since the fall yesterday she has had increased tremors.  She does endorse tremors at baseline but states these are much more severe and she is having a hard time keeping her balance and walking.  She denies chest pain, shortness of breath, abdominal pain, nausea, vomiting  Review of Systems  Positive: As above Negative: As above  Physical Exam  BP (!) 142/86 (BP Location: Left Arm)   Pulse 73   Temp 98.2 F (36.8 C) (Oral)   Resp 16   SpO2 98%  Gen:   Awake, no distress   Resp:  Normal effort  MSK:   Moves extremities without difficulty  Other:    Medical Decision Making  Medically screening exam initiated at 3:26 PM.  Appropriate orders placed.  Valerie [name] was informed that the remainder of the evaluation will be completed by another provider, this initial triage assessment does not replace that evaluation, and the importance of remaining in the ED until their evaluation is complete.     Dorothyann Peng, PA-C 08/23/22 1528

## 2022-08-23 NOTE — ED Triage Notes (Signed)
Pt arrived via POV, states mechanical fall at home. Hitting right side of head/face.

## 2022-08-23 NOTE — ED Notes (Signed)
Patient transported to MRI 

## 2022-08-23 NOTE — ED Provider Notes (Signed)
Valerie [name]   CSN: [id number] Arrival date & time: 08/23/22  1440     History  Chief Complaint  Patient presents with   Lytle Michaels    Valerie [name] is a 61 y.o. female.   Fall     61 year old female with medical history significant for depression, anxiety, OSA, NSTEMI, DM2, CKD stage III, bipolar disorder, questionable history of prior CVA who presents to the emergency department with dizziness and a fall.  The patient states that she has had room spinning dizziness that has been fairly persistent for the past month.  She was initially seen by her PCP and was diagnosed with vertigo but states that she is having ongoing persistent room spinning dizziness.  She states that she tripped and fell forward hitting her head on a hardwood floor.  She denies any loss of consciousness.  She is not on anticoagulation.  She states that she has had difficulty with tremors of her hands and has had continued room spinning dizziness which has been persistent since yesterday afternoon.  She denies any chest pain, headaches, shortness of breath, vision changes, facial droop, numbness or weakness of the extremities.  She arrived to the emergency department GCS 15, ABC intact.  Home Medications Prior to Admission medications   Medication Sig Start Date End Date Taking? Authorizing Provider  ALPRAZolam Duanne Moron) 0.5 MG tablet Take 1/2 to 1 tablet TID prn 03/20/22   [provider]  Ascorbic Acid (VITAMIN C) 1000 MG tablet Take 1,000 mg by mouth at bedtime.    [provider]  Cholecalciferol (VITAMIN D-3) 1000 units CAPS Take 2,000 Units by mouth daily.    [provider]  Cyanocobalamin (VITAMIN B-12 PO) Take 1 tablet by mouth daily.    [provider]  docusate sodium (COLACE) 100 MG capsule Take 200 mg by mouth at bedtime.    [provider]  donepezil (ARICEPT) 10 MG tablet Take 10 mg by mouth daily. 10/30/19    [provider]  FLUoxetine (PROZAC) 40 MG capsule Take 40 mg by mouth daily. 04/09/22   [provider]  HYDROcodone-acetaminophen (NORCO) 10-325 MG tablet Take 0.5-1 tablets by mouth every 6 (six) hours as needed for moderate pain or severe pain. 08/21/20   Eugenie Filler, MD  HYDROmorphone (DILAUDID) 2 MG tablet Take 2 mg by mouth every 6 (six) hours as needed. 02/12/22   [provider]  lamoTRIgine (LAMICTAL) 150 MG tablet Take 150 mg by mouth 2 (two) times daily.  02/27/18   [provider]  levocetirizine (XYZAL) 5 MG tablet Take 5 mg by mouth every evening.    [provider]  memantine (NAMENDA) 10 MG tablet TAKE 1 TABLET BY MOUTH EVERY DAY FOR 7 DAYS THEN TAKE 1 TABLET BY MOUTH TWICE DAILY THEREAFTER 03/18/22   [provider]  nitroGLYCERIN (NITROSTAT) 0.4 MG SL tablet Place 1 tablet (0.4 mg total) under the tongue every 5 (five) minutes as needed for chest pain. 03/04/21   [name], MD  omeprazole (PRILOSEC) 20 MG capsule Take 1 capsule (20 mg total) by mouth 2 (two) times daily before a meal. Patient taking differently: Take 20 mg by mouth daily. 06/12/20 10/10/20  Barb Merino, MD  oxybutynin (DITROPAN) 5 MG tablet Take 5 mg by mouth 2 (two) times daily. 10/29/20   [provider]  predniSONE (DELTASONE) 10 MG tablet Take 5 mg by mouth daily with breakfast. Take 5 mg by mouth every other  day, alternating with 10 mg on opposing days    [provider]  primidone (MYSOLINE) 50 MG tablet Take by mouth. 04/09/22   [provider]  Probiotic Product (PROBIOTIC DAILY PO) Take 1 tablet by mouth daily.    [provider]  rizatriptan (MAXALT-MLT) 10 MG disintegrating tablet Take 10 mg by mouth See admin instructions. Take one tablet (10 mg) by mouth daily as needed for migraine. May repeat in 2 hours if still needed    [provider]  rosuvastatin (CRESTOR) 10 MG tablet Take 1 tablet (10 mg  total) by mouth daily. 01/10/21   [name], MD  traZODone (DESYREL) 100 MG tablet Take 2 tablets (200 mg total) by mouth at bedtime. 07/23/20   Earnstine Regal, PA-C      Allergies    Carbamazepine and Sertraline hcl    Review of Systems   Review of Systems  Neurological:  Positive for dizziness.  All other systems reviewed and are negative.   Physical Exam Updated Vital Signs BP 123/86   Pulse 67   Temp 98.2 F (36.8 C) (Oral)   Resp 16   Ht '5\' 2"'$  (1.575 m)   Wt 94 kg   SpO2 93%   BMI 37.90 kg/m  Physical Exam Vitals and nursing [name] reviewed.  Constitutional:      General: She is not in acute distress.    Appearance: She is well-developed.     Comments: GCS 15, ABC intact  HENT:     Head: Normocephalic and atraumatic.  Eyes:     Extraocular Movements: Extraocular movements intact.     Conjunctiva/sclera: Conjunctivae normal.     Pupils: Pupils are equal, round, and reactive to light.  Neck:     Comments: No midline tenderness to palpation of the cervical spine.  Range of motion intact Cardiovascular:     Rate and Rhythm: Normal rate and regular rhythm.  Pulmonary:     Effort: Pulmonary effort is normal. No respiratory distress.     Breath sounds: Normal breath sounds.  Chest:     Comments: Clavicles stable nontender to AP compression.  Chest wall stable and nontender to AP and lateral compression. Abdominal:     Palpations: Abdomen is soft.     Tenderness: There is no abdominal tenderness.     Comments: Pelvis stable to lateral compression  Musculoskeletal:     Cervical back: Neck supple.     Comments: No midline tenderness to palpation of the thoracic or lumbar spine.  Extremities atraumatic with intact range of motion  Skin:    General: Skin is warm and dry.  Neurological:     Mental Status: She is alert.     Comments: MENTAL STATUS EXAM:    Orientation: Alert and oriented to person, place and time.  Memory: Cooperative, follows commands well.   Language: Speech is clear and language is normal.   CRANIAL NERVES:    CN 2 (Optic): Visual fields intact to confrontation.  CN 3,4,6 (EOM): Pupils equal and reactive to light. Full extraocular eye movement with some vertical nystagmus noted. CN 5 (Trigeminal): Facial sensation is normal, no weakness of masticatory muscles.  CN 7 (Facial): No facial weakness or asymmetry.  CN 8 (Auditory): Auditory acuity grossly normal.  CN 9,10 (Glossophar): The uvula is midline, the palate elevates symmetrically.  CN 11 (spinal access): Normal sternocleidomastoid and trapezius strength.  CN 12 (Hypoglossal): The tongue is midline. No atrophy or fasciculations.Marland Kitchen   MOTOR:  Muscle  Strength: 5/5RUE, 5/5LUE, 5/5RLE, 5/5LLE.   COORDINATION:   Bilateral dysmetria present  SENSATION:   Intact to light touch all four extremities.  GAIT: Gait not assessed due to ongoing vertiginous symptoms      ED Results / Procedures / Treatments   Labs (all labs ordered are listed, but only abnormal results are displayed) Labs Reviewed  URINALYSIS, ROUTINE W REFLEX MICROSCOPIC - Abnormal; Notable for the following components:      Result Value   APPearance HAZY (*)    Nitrite POSITIVE (*)    Leukocytes,Ua SMALL (*)    Bacteria, UA MANY (*)    All other components within normal limits  CBC WITH DIFFERENTIAL/PLATELET - Abnormal; Notable for the following components:   WBC 10.8 (*)    MCV 104.0 (*)    Neutro Abs 9.3 (*)    Lymphs Abs 0.6 (*)    Abs Immature Granulocytes 0.10 (*)    All other components within normal limits  COMPREHENSIVE METABOLIC PANEL - Abnormal; Notable for the following components:   Potassium 3.1 (*)    Glucose, Bld 159 (*)    Creatinine, Ser 1.59 (*)    Calcium 8.5 (*)    Total Protein 6.3 (*)    GFR, Estimated 37 (*)    All other components within normal limits  CBG MONITORING, ED - Abnormal; Notable for the following components:   Glucose-Capillary 173 (*)    All other components  within normal limits  RESP PANEL BY RT-PCR (RSV, FLU A&B, COVID)  RVPGX2  MAGNESIUM    EKG EKG Interpretation  Date/Time:  Sunday August 23 2022 15:54:25 EST Ventricular Rate:  75 PR Interval:  211 QRS Duration: 78 QT Interval:  362 QTC Calculation: 405 R Axis:   -26 Text Interpretation: Sinus or ectopic atrial rhythm Inferior infarct, old Anterior infarct, old Confirmed by Regan Lemming ([id number]) on 08/23/2022 6:23:44 PM  Radiology MR BRAIN WO CONTRAST  Result Date: 08/23/2022 CLINICAL DATA:  Vertigo EXAM: MRI HEAD WITHOUT CONTRAST MRA HEAD WITHOUT CONTRAST MRA OF THE NECK WITHOUT AND WITH CONTRAST TECHNIQUE: Multiplanar, multi-echo pulse sequences of the brain and surrounding structures were acquired without intravenous contrast. Angiographic images of the Circle of Willis were acquired using MRA technique without intravenous contrast. Angiographic images of the neck were acquired using MRA technique without and with intravenous contrast. Carotid stenosis measurements (when applicable) are obtained utilizing NASCET criteria, using the distal internal carotid diameter as the denominator. CONTRAST:  55m GADAVIST GADOBUTROL 1 MMOL/ML IV SOLN COMPARISON:  07/25/2022 brain MRI FINDINGS: MR HEAD FINDINGS Brain: No acute infarction, hemorrhage, hydrocephalus, extra-axial collection or mass lesion. No chronic microhemorrhage. Mild hyperintense T2-weighted signal within the periventricular white matter. Otherwise normal brain parenchyma. Normal CSF spaces. Partially empty sella incidentally noted. Vascular: Normal flow voids. Skull and upper cervical spine: Normal marrow signal. Sinuses/Orbits: Negative Other: None MRA HEAD FINDINGS POSTERIOR CIRCULATION: --Vertebral arteries: Normal --Inferior cerebellar arteries: Normal. --Basilar artery: Normal. --Superior cerebellar arteries: Normal. --Posterior cerebral arteries: Normal. ANTERIOR CIRCULATION: --Intracranial internal carotid arteries: Normal.  --Anterior cerebral arteries (ACA): Normal. --Middle cerebral arteries (MCA): Normal. MRA NECK FINDINGS Aortic arch: Normal Right carotid system: Normal Left carotid system: Normal Vertebral arteries: Codominant system. Limited visualization of the origins but otherwise normal. Other: None. IMPRESSION: 1. No acute intracranial abnormality. 2. Normal MRA of the head and neck. 3. Mild chronic small vessel disease. Electronically Signed   By: KUlyses JarredM.D.   On: 08/23/2022 21:41   MR Angiogram Neck W or  Wo Contrast  Result Date: 08/23/2022 CLINICAL DATA:  Vertigo EXAM: MRI HEAD WITHOUT CONTRAST MRA HEAD WITHOUT CONTRAST MRA OF THE NECK WITHOUT AND WITH CONTRAST TECHNIQUE: Multiplanar, multi-echo pulse sequences of the brain and surrounding structures were acquired without intravenous contrast. Angiographic images of the Circle of Willis were acquired using MRA technique without intravenous contrast. Angiographic images of the neck were acquired using MRA technique without and with intravenous contrast. Carotid stenosis measurements (when applicable) are obtained utilizing NASCET criteria, using the distal internal carotid diameter as the denominator. CONTRAST:  20m GADAVIST GADOBUTROL 1 MMOL/ML IV SOLN COMPARISON:  07/25/2022 brain MRI FINDINGS: MR HEAD FINDINGS Brain: No acute infarction, hemorrhage, hydrocephalus, extra-axial collection or mass lesion. No chronic microhemorrhage. Mild hyperintense T2-weighted signal within the periventricular white matter. Otherwise normal brain parenchyma. Normal CSF spaces. Partially empty sella incidentally noted. Vascular: Normal flow voids. Skull and upper cervical spine: Normal marrow signal. Sinuses/Orbits: Negative Other: None MRA HEAD FINDINGS POSTERIOR CIRCULATION: --Vertebral arteries: Normal --Inferior cerebellar arteries: Normal. --Basilar artery: Normal. --Superior cerebellar arteries: Normal. --Posterior cerebral arteries: Normal. ANTERIOR CIRCULATION:  --Intracranial internal carotid arteries: Normal. --Anterior cerebral arteries (ACA): Normal. --Middle cerebral arteries (MCA): Normal. MRA NECK FINDINGS Aortic arch: Normal Right carotid system: Normal Left carotid system: Normal Vertebral arteries: Codominant system. Limited visualization of the origins but otherwise normal. Other: None. IMPRESSION: 1. No acute intracranial abnormality. 2. Normal MRA of the head and neck. 3. Mild chronic small vessel disease. Electronically Signed   By: KUlyses JarredM.D.   On: 08/23/2022 21:41   MR ANGIO HEAD WO CONTRAST  Result Date: 08/23/2022 CLINICAL DATA:  Vertigo EXAM: MRI HEAD WITHOUT CONTRAST MRA HEAD WITHOUT CONTRAST MRA OF THE NECK WITHOUT AND WITH CONTRAST TECHNIQUE: Multiplanar, multi-echo pulse sequences of the brain and surrounding structures were acquired without intravenous contrast. Angiographic images of the Circle of Willis were acquired using MRA technique without intravenous contrast. Angiographic images of the neck were acquired using MRA technique without and with intravenous contrast. Carotid stenosis measurements (when applicable) are obtained utilizing NASCET criteria, using the distal internal carotid diameter as the denominator. CONTRAST:  133mGADAVIST GADOBUTROL 1 MMOL/ML IV SOLN COMPARISON:  07/25/2022 brain MRI FINDINGS: MR HEAD FINDINGS Brain: No acute infarction, hemorrhage, hydrocephalus, extra-axial collection or mass lesion. No chronic microhemorrhage. Mild hyperintense T2-weighted signal within the periventricular white matter. Otherwise normal brain parenchyma. Normal CSF spaces. Partially empty sella incidentally noted. Vascular: Normal flow voids. Skull and upper cervical spine: Normal marrow signal. Sinuses/Orbits: Negative Other: None MRA HEAD FINDINGS POSTERIOR CIRCULATION: --Vertebral arteries: Normal --Inferior cerebellar arteries: Normal. --Basilar artery: Normal. --Superior cerebellar arteries: Normal. --Posterior cerebral  arteries: Normal. ANTERIOR CIRCULATION: --Intracranial internal carotid arteries: Normal. --Anterior cerebral arteries (ACA): Normal. --Middle cerebral arteries (MCA): Normal. MRA NECK FINDINGS Aortic arch: Normal Right carotid system: Normal Left carotid system: Normal Vertebral arteries: Codominant system. Limited visualization of the origins but otherwise normal. Other: None. IMPRESSION: 1. No acute intracranial abnormality. 2. Normal MRA of the head and neck. 3. Mild chronic small vessel disease. Electronically Signed   By: KeUlyses Jarred.D.   On: 08/23/2022 21:41   DG Chest Portable 1 View  Result Date: 08/23/2022 CLINICAL DATA:  Fall EXAM: PORTABLE CHEST 1 VIEW COMPARISON:  08/19/2020 FINDINGS: Minimal right base opacity. Stable borderline to mild cardiomegaly. No pleural effusion or pneumothorax. Ovoid opacity overlying the right fourth rib. IMPRESSION: 1. Minimal right base opacity, suspect for atelectasis. 2. Ovoid opacity overlying the right fourth rib, question healing rib fracture. Chest  CT could be obtained to exclude pulmonary nodule. 3. Borderline to mild cardiomegaly without overt edema or pleural effusion. Electronically Signed   By: Donavan Foil M.D.   On: 08/23/2022 18:57   CT HEAD WO CONTRAST  Result Date: 08/23/2022 CLINICAL DATA:  Mental status change, unknown cause. EXAM: CT HEAD WITHOUT CONTRAST TECHNIQUE: Contiguous axial images were obtained from the base of the skull through the vertex without intravenous contrast. RADIATION DOSE REDUCTION: This exam was performed according to the departmental dose-optimization program which includes automated exposure control, adjustment of the mA and/or kV according to patient size and/or use of iterative reconstruction technique. COMPARISON:  MR head without contrast 07/25/2022. CT head without contrast 11/27/2021 FINDINGS: Brain: No acute infarct, hemorrhage, or mass lesion is present. No significant white matter lesions are present. The  ventricles are of normal size. Deep brain nuclei are within normal limits. No significant extraaxial fluid collection is present. The brainstem and cerebellum are within normal limits. Vascular: No hyperdense vessel or unexpected calcification. Skull: Calvarium is intact. No focal lytic or blastic lesions are present. No significant extracranial soft tissue lesion is present. Sinuses/Orbits: The paranasal sinuses and mastoid air cells are clear. Bilateral lens replacements are noted. Globes and orbits are otherwise unremarkable. IMPRESSION: Negative CT of the head. Electronically Signed   By: San Morelle M.D.   On: 08/23/2022 15:58    Procedures Procedures    Medications Ordered in ED Medications  potassium chloride SA (KLOR-CON M) CR tablet 40 mEq (40 mEq Oral Given 08/23/22 1927)  meclizine (ANTIVERT) tablet 25 mg (25 mg Oral Given 08/23/22 1927)  gadobutrol (GADAVIST) 1 MMOL/ML injection 10 mL (10 mLs Intravenous Contrast Given 08/23/22 2044)  fosfomycin (MONUROL) packet 3 g (3 g Oral Given 08/23/22 2219)    ED Course/ Medical Decision Making/ A&P                           Medical Decision Making Amount and/or Complexity of Data Reviewed Labs: ordered. Radiology: ordered.  Risk Prescription drug management.     61 year old female with medical history significant for depression, anxiety, OSA, NSTEMI, DM2, CKD stage III, bipolar disorder, questionable history of prior CVA who presents to the emergency department with dizziness and a fall.  The patient states that she has had room spinning dizziness that has been fairly persistent for the past month.  She was initially seen by her PCP and was diagnosed with vertigo but states that she is having ongoing persistent room spinning dizziness.  She states that she tripped and fell forward hitting her head on a hardwood floor.  She denies any loss of consciousness.  She is not on anticoagulation.  She states that she has had difficulty  with tremors of her hands and has had continued room spinning dizziness which has been persistent since yesterday afternoon.  She denies any chest pain, headaches, shortness of breath, vision changes, facial droop, numbness or weakness of the extremities.  She arrived to the emergency department GCS 15, ABC intact.  On arrival, the patient was vitally stable, afebrile, hypertensive BP 142/86, saturating 98% on room air.  Physical exam concerning for possible central etiology of the patient's vertigo with dysmetria present and vertical nystagmus present on cranial nerve testing.  Patient last normal prior to 11 AM yesterday.  Code stroke not called due to timeframe of illness.  Patient also states that she has been having vertiginous symptoms for the past month which  has been fairly persistent since she was seen by her PCP, persistently ongoing since at least 11 AM yesterday without cessation. CBG on arrival 173.  19, influenza, RSV PCR testing negative, CMP with mild hypokalemia to 3.1, serum creatinine at 1.59, magnesium normal at 1.8, CBC with nonspecific leukocytosis to 10.8.  She was administered meclizine and potassium supplementation.2  CT Head: NAICA CXR:  IMPRESSION:  1. Minimal right base opacity, suspect for atelectasis.  2. Ovoid opacity overlying the right fourth rib, question healing  rib fracture. Chest CT could be obtained to exclude pulmonary  nodule.  3. Borderline to mild cardiomegaly without overt edema or pleural  effusion.    MRI Brain WO, MRA Neck, MRA Brain:  FINDINGS:  MR HEAD FINDINGS    Brain: No acute infarction, hemorrhage, hydrocephalus, extra-axial  collection or mass lesion. No chronic microhemorrhage. Mild  hyperintense T2-weighted signal within the periventricular white  matter. Otherwise normal brain parenchyma. Normal CSF spaces.  Partially empty sella incidentally noted.    Vascular: Normal flow voids.    Skull and upper cervical spine: Normal marrow  signal.    Sinuses/Orbits: Negative    Other: None    MRA HEAD FINDINGS    POSTERIOR CIRCULATION:    --Vertebral arteries: Normal    --Inferior cerebellar arteries: Normal.    --Basilar artery: Normal.    --Superior cerebellar arteries: Normal.    --Posterior cerebral arteries: Normal.    ANTERIOR CIRCULATION:    --Intracranial internal carotid arteries: Normal.    --Anterior cerebral arteries (ACA): Normal.    --Middle cerebral arteries (MCA): Normal.    MRA NECK FINDINGS    Aortic arch: Normal    Right carotid system: Normal    Left carotid system: Normal    Vertebral arteries: Codominant system. Limited visualization of the  origins but otherwise normal.    Other: None.    IMPRESSION:  1. No acute intracranial abnormality.  2. Normal MRA of the head and neck.  3. Mild chronic small vessel disease.    Patient laboratory evaluation is significant for mild hypokalemia to 3.1, replenished orally, evidence of UTI with nitrates, small leukocytes, many bacteria.  The patient was administered a single dose of fosfomycin 3 g.  She is overall symptomatically improved at this time on repeat assessment.  No evidence for acute traumatic injury on primary or secondary survey, no evidence for CVA or vascular abnormality on MRI and MRA imaging.  Recommended the patient follow-up outpatient with her PCP regarding her symptoms of vertigo.  The patient states that her PCP is currently on leave and she requested a new one.  She was advised to call the [facility] community health and wellness to set up an appointment for new PCP visit as needed.  Stable for discharge at this time.   Final Clinical Impression(s) / ED Diagnoses Final diagnoses:  Vertigo  Fall, initial encounter    Rx / DC Orders ED Discharge Orders     None         Regan Lemming, MD 08/23/22 2237

## 2022-10-21 ENCOUNTER — Encounter: Payer: Self-pay | Admitting: Internal Medicine

## 2022-10-21 ENCOUNTER — Ambulatory Visit (INDEPENDENT_AMBULATORY_CARE_PROVIDER_SITE_OTHER): Payer: Medicare Other | Admitting: Internal Medicine

## 2022-10-21 VITALS — BP 140/80 | HR 69 | Temp 98.3°F | Wt 204.0 lb

## 2022-10-21 DIAGNOSIS — D638 Anemia in other chronic diseases classified elsewhere: Secondary | ICD-10-CM

## 2022-10-21 DIAGNOSIS — N1832 Chronic kidney disease, stage 3b: Secondary | ICD-10-CM

## 2022-10-21 DIAGNOSIS — Z79899 Other long term (current) drug therapy: Secondary | ICD-10-CM

## 2022-10-21 DIAGNOSIS — L4050 Arthropathic psoriasis, unspecified: Secondary | ICD-10-CM

## 2022-10-21 DIAGNOSIS — E042 Nontoxic multinodular goiter: Secondary | ICD-10-CM

## 2022-10-21 DIAGNOSIS — E1140 Type 2 diabetes mellitus with diabetic neuropathy, unspecified: Secondary | ICD-10-CM

## 2022-10-21 DIAGNOSIS — I10 Essential (primary) hypertension: Secondary | ICD-10-CM

## 2022-10-21 DIAGNOSIS — K219 Gastro-esophageal reflux disease without esophagitis: Secondary | ICD-10-CM

## 2022-10-21 DIAGNOSIS — Z720 Tobacco use: Secondary | ICD-10-CM

## 2022-10-21 DIAGNOSIS — E785 Hyperlipidemia, unspecified: Secondary | ICD-10-CM

## 2022-10-21 DIAGNOSIS — E1169 Type 2 diabetes mellitus with other specified complication: Secondary | ICD-10-CM

## 2022-10-21 DIAGNOSIS — D84821 Immunodeficiency due to drugs: Secondary | ICD-10-CM

## 2022-10-21 MED ORDER — OMEPRAZOLE 40 MG PO CPDR: 40.0000 mg | DELAYED_RELEASE_CAPSULE | Freq: Two times a day (BID) | ORAL | 0 refills | Status: DC

## 2022-10-21 NOTE — Patient Instructions (Addendum)
We will get the records and then let you know if anything is needed. Let us know about refills and if the physical therapy is not done.

## 2022-10-21 NOTE — Progress Notes (Unsigned)
   Subjective:   Patient ID: Valerie [name], female    DOB: 03-22-1961, 62 y.o.   MRN: [id number]  HPI The patient is a new 62 YO female coming in for ongoing care.  PMH, [facility], social history reviewed and updated  Review of Systems  Constitutional: Negative.   HENT: Negative.    Eyes: Negative.   Respiratory:  Negative for cough, chest tightness and shortness of breath.   Cardiovascular:  Negative for chest pain, palpitations and leg swelling.  Gastrointestinal:  Negative for abdominal distention, abdominal pain, constipation, diarrhea, nausea and vomiting.  Musculoskeletal:  Positive for arthralgias and myalgias.  Skin: Negative.   Neurological: Negative.   Psychiatric/Behavioral: Negative.      Objective:  Physical Exam Constitutional:      Appearance: She is well-developed. She is obese.  HENT:     Head: Normocephalic and atraumatic.  Cardiovascular:     Rate and Rhythm: Normal rate and regular rhythm.  Pulmonary:     Effort: Pulmonary effort is normal. No respiratory distress.     Breath sounds: Normal breath sounds. No wheezing or rales.  Abdominal:     General: Bowel sounds are normal. There is no distension.     Palpations: Abdomen is soft.     Tenderness: There is no abdominal tenderness. There is no rebound.  Musculoskeletal:     Cervical back: Normal range of motion.  Skin:    General: Skin is warm and dry.  Neurological:     Mental Status: She is alert and oriented to person, place, and time.     Coordination: Coordination normal.     Vitals:   10/21/22 0830 10/21/22 0832  BP: (!) 140/80 (!) 140/80  Pulse: 69   Temp: 98.3 F (36.8 C)   TempSrc: Oral   SpO2: 98%   Weight: 204 lb (92.5 kg)     Assessment & Plan:

## 2022-10-22 ENCOUNTER — Encounter: Payer: Self-pay | Admitting: Internal Medicine

## 2022-10-22 DIAGNOSIS — K219 Gastro-esophageal reflux disease without esophagitis: Secondary | ICD-10-CM | POA: Insufficient documentation

## 2022-10-22 DIAGNOSIS — E1169 Type 2 diabetes mellitus with other specified complication: Secondary | ICD-10-CM | POA: Insufficient documentation

## 2022-10-22 NOTE — Assessment & Plan Note (Signed)
BP is borderline today but close to goal. She states lower at home. Taking no BP meds currently per her list. If needed would add ARB for her CKD.

## 2022-10-22 NOTE — Assessment & Plan Note (Signed)
Sees endo and overall up to date on labs and [place] for screening. No new symptoms of compression.

## 2022-10-22 NOTE — Assessment & Plan Note (Signed)
Recent labs Dec 2023 stable with CKD stage 3b. She is still using NSAIDs often. Advised to stop all NSAIDs due to CKD and educated that this leads to progression of CKD.

## 2022-10-22 NOTE — Assessment & Plan Note (Signed)
Taking crestor 10 mg daily and recent labs. Will get those records and update lipid panel if/when due. Goal LDL<70.

## 2022-10-22 NOTE — Assessment & Plan Note (Signed)
Recent labs stable from Dec 2023. Getting records. May need further labs to assess for anemia related to CKD.

## 2022-10-22 NOTE — Assessment & Plan Note (Signed)
Is on chronic prednisone treatment and is considered immunocompromised. No clinical signs of infection today.

## 2022-10-22 NOTE — Assessment & Plan Note (Signed)
BMI 37 and complicated by hyperlipidemia and diabetes and hypertension. Counseled about diet and exercise.

## 2022-10-22 NOTE — Assessment & Plan Note (Signed)
Seeing rheumatology and taking prednisone 5 mg daily currently per patient. No flare today.

## 2022-10-22 NOTE — Assessment & Plan Note (Signed)
Smoking about 0.5 PPD and counseled to quit. She is unsure about ability to quit at this time.

## 2022-10-22 NOTE — Assessment & Plan Note (Signed)
Taking omeprazole 40 mg daily and having more pain lately. Will increase to 40 mg BID for 1-2 weeks then decrease back to 40 mg daily. If no improvement needs to return to GI.

## 2022-10-22 NOTE — Assessment & Plan Note (Signed)
Recent HgA1c 6.9 with PCP. Is related at least partially to ongoing chronic prednisone use. Is currently taking prednisone 5 mg daily. Foot exam done today. Getting records from recent labs and will perform to get her up to date as needed. Reminded about importance of eye exam. She is taking statin.

## 2022-10-27 ENCOUNTER — Telehealth: Payer: Self-pay | Admitting: Internal Medicine

## 2022-10-27 DIAGNOSIS — L4050 Arthropathic psoriasis, unspecified: Secondary | ICD-10-CM

## 2022-10-27 NOTE — Telephone Encounter (Signed)
Pt called wanted Dr. Sharlet Salina to order home Patient Therapy. Pt stated that the neurology didn't put in a order for it.

## 2022-10-29 NOTE — Telephone Encounter (Signed)
Put it in

## 2022-11-10 DIAGNOSIS — G20A1 Parkinson's disease without dyskinesia, without mention of fluctuations: Secondary | ICD-10-CM | POA: Diagnosis not present

## 2022-11-10 DIAGNOSIS — M542 Cervicalgia: Secondary | ICD-10-CM | POA: Diagnosis not present

## 2022-11-26 ENCOUNTER — Ambulatory Visit: Payer: Medicare Other | Admitting: Internal Medicine

## 2022-12-01 ENCOUNTER — Telehealth: Payer: Self-pay | Admitting: Internal Medicine

## 2022-12-01 NOTE — Telephone Encounter (Signed)
Didn't see  Fluconazole  on pt's med list

## 2022-12-01 NOTE — Telephone Encounter (Signed)
Prescription Request  12/01/2022  LOV: 10/21/2022  What is the name of the medication or equipment? Fluconazole 100mg  - would like regular refills, hydrocodone-acetaminophen 77-325- states old provider would give her enough for the year  Have you contacted your pharmacy to request a refill? No   Which pharmacy would you like this sent to?  [id number] [place], [place] - [phone] AT [facility] & [address] [place] [address] Phone: [phone] Fax: [phone]    Patient notified that their request is being sent to the clinical staff for review and that they should receive a response within 2 business days.   Please advise at [facility] [phone] , patient would like a call when refills have been sent in .

## 2022-12-01 NOTE — Telephone Encounter (Signed)
We do not prescribe hydrocodone for her. If she is wanting [place] to prescribe she would need visit but would not be guaranteed we would be able to prescribe this. Diflucan we can discuss reason she is taking and prescribe if appropriate.

## 2022-12-02 NOTE — Telephone Encounter (Signed)
Called pt no answer LMOM w/MD response../lmb 

## 2022-12-04 ENCOUNTER — Ambulatory Visit (INDEPENDENT_AMBULATORY_CARE_PROVIDER_SITE_OTHER): Payer: Medicare Other | Admitting: Internal Medicine

## 2022-12-04 VITALS — BP 100/80 | HR 87 | Temp 98.5°F | Ht 62.0 in | Wt 198.5 lb

## 2022-12-04 DIAGNOSIS — N1832 Chronic kidney disease, stage 3b: Secondary | ICD-10-CM | POA: Diagnosis not present

## 2022-12-04 DIAGNOSIS — L4050 Arthropathic psoriasis, unspecified: Secondary | ICD-10-CM

## 2022-12-04 DIAGNOSIS — N3946 Mixed incontinence: Secondary | ICD-10-CM

## 2022-12-04 DIAGNOSIS — R32 Unspecified urinary incontinence: Secondary | ICD-10-CM | POA: Insufficient documentation

## 2022-12-04 DIAGNOSIS — B372 Candidiasis of skin and nail: Secondary | ICD-10-CM | POA: Insufficient documentation

## 2022-12-04 MED ORDER — HYDROCODONE-ACETAMINOPHEN 10-325 MG PO TABS: 0.5000 | ORAL_TABLET | Freq: Four times a day (QID) | ORAL | 0 refills | Status: DC | PRN

## 2022-12-04 MED ORDER — ROSUVASTATIN CALCIUM 10 MG PO TABS: 10.0000 mg | ORAL_TABLET | Freq: Every day | ORAL | 3 refills | Status: AC

## 2022-12-04 MED ORDER — OXYBUTYNIN CHLORIDE 5 MG PO TABS: 5.0000 mg | ORAL_TABLET | Freq: Two times a day (BID) | ORAL | 3 refills | Status: DC

## 2022-12-04 MED ORDER — MECLIZINE HCL 12.5 MG PO TABS: 12.5000 mg | ORAL_TABLET | Freq: Three times a day (TID) | ORAL | 0 refills | Status: DC | PRN

## 2022-12-04 MED ORDER — FLUCONAZOLE 150 MG PO TABS: 150.0000 mg | ORAL_TABLET | ORAL | 1 refills | Status: DC

## 2022-12-04 MED ORDER — PREDNISONE 10 MG PO TABS: 5.0000 mg | ORAL_TABLET | Freq: Every day | ORAL | 3 refills | Status: DC

## 2022-12-04 NOTE — Progress Notes (Signed)
   Subjective:   Patient ID: Valerie [name], female    DOB: [birth year], 62 y.o.   MRN: [id number]  Rash Pertinent negatives include no cough, diarrhea, shortness of breath or vomiting.  Medication Refill Associated symptoms include a rash. Pertinent negatives include no abdominal pain, chest pain, coughing, nausea or vomiting.   The patient is a 62 YO female coming in for rash. Started 4 days ago.  Review of Systems  Constitutional: Negative.   HENT: Negative.    Eyes: Negative.   Respiratory:  Negative for cough, chest tightness and shortness of breath.   Cardiovascular:  Negative for chest pain, palpitations and leg swelling.  Gastrointestinal:  Negative for abdominal distention, abdominal pain, constipation, diarrhea, nausea and vomiting.  Musculoskeletal: Negative.   Skin:  Positive for rash.  Neurological: Negative.   Psychiatric/Behavioral: Negative.      Objective:  Physical Exam Constitutional:      Appearance: She is well-developed. She is obese.  HENT:     Head: Normocephalic and atraumatic.  Cardiovascular:     Rate and Rhythm: Normal rate and regular rhythm.  Pulmonary:     Effort: Pulmonary effort is normal. No respiratory distress.     Breath sounds: Normal breath sounds. No wheezing or rales.  Abdominal:     General: Bowel sounds are normal. There is no distension.     Palpations: Abdomen is soft.     Tenderness: There is no abdominal tenderness. There is no rebound.  Musculoskeletal:        General: Tenderness present.     Cervical back: Normal range of motion.  Skin:    General: Skin is warm and dry.  Neurological:     Mental Status: She is alert and oriented to person, place, and time.     Coordination: Coordination normal.     Vitals:   12/04/22 1344  BP: 100/80  Pulse: 87  Temp: 98.5 F (36.9 C)  TempSrc: Oral  SpO2: 99%  Weight: 198 lb 8 oz (90 kg)  Height: 5\' 2"  (1.575 m)    Assessment & Plan:

## 2022-12-04 NOTE — Patient Instructions (Signed)
We have sent in the refills and the diflucan. If needed you can take every 3 days.

## 2022-12-04 NOTE — Assessment & Plan Note (Signed)
She is aware to avoid NSAIDs.

## 2022-12-04 NOTE — Assessment & Plan Note (Signed)
Taking oxybutynin 5 mg daily and will refill today. She is aware of risk of side effects dry mouth and constipation being predominant.

## 2022-12-04 NOTE — Assessment & Plan Note (Signed)
Given immunosuppression with prednisone will rx diflucan 5pills with 1 refill. She is aware to take 1 pill and a second in 3 days if persistent.

## 2022-12-04 NOTE — Assessment & Plan Note (Signed)
Needs refill on prednisone (takes 5mg  alternating with 10 mg daily). Done and has chronic pain associated will do short fill of hydrocodone. She does have CKD stage 3 and cannot take NSAIDs and tylenol is not always effective although she tries this first.

## 2022-12-07 ENCOUNTER — Ambulatory Visit: Payer: Medicare Other | Admitting: Internal Medicine

## 2022-12-08 DIAGNOSIS — M5417 Radiculopathy, lumbosacral region: Secondary | ICD-10-CM | POA: Diagnosis not present

## 2022-12-22 DIAGNOSIS — M7912 Myalgia of auxiliary muscles, head and neck: Secondary | ICD-10-CM | POA: Diagnosis not present

## 2023-01-03 ENCOUNTER — Encounter: Payer: Self-pay | Admitting: Internal Medicine

## 2023-01-11 ENCOUNTER — Encounter: Payer: Self-pay | Admitting: Family Medicine

## 2023-01-11 ENCOUNTER — Ambulatory Visit (INDEPENDENT_AMBULATORY_CARE_PROVIDER_SITE_OTHER): Payer: Medicare Other | Admitting: Family Medicine

## 2023-01-11 VITALS — BP 102/66 | HR 72 | Temp 98.5°F | Resp 20 | Ht 62.0 in | Wt 197.0 lb

## 2023-01-11 DIAGNOSIS — R42 Dizziness and giddiness: Secondary | ICD-10-CM | POA: Diagnosis not present

## 2023-01-11 DIAGNOSIS — Z5986 Financial insecurity: Secondary | ICD-10-CM | POA: Diagnosis not present

## 2023-01-11 DIAGNOSIS — Z5941 Food insecurity: Secondary | ICD-10-CM | POA: Diagnosis not present

## 2023-01-11 MED ORDER — MECLIZINE HCL 25 MG PO TABS
25.0000 mg | ORAL_TABLET | Freq: Three times a day (TID) | ORAL | 2 refills | Status: DC | PRN
Start: 2023-01-11 — End: 2023-10-05

## 2023-01-11 NOTE — Telephone Encounter (Signed)
Schedule a virtual or OV?

## 2023-01-11 NOTE — Progress Notes (Signed)
Assessment & Plan:  1. Vertigo Education provided on how to perform Epley maneuver at home.  Recommended a referral to vestibular rehab, but patient is unable to afford the copayments.  Increased meclizine dose from 12.5 mg up to 25 mg. - meclizine (ANTIVERT) 25 MG tablet; Take 1 tablet (25 mg total) by mouth 3 (three) times daily as needed for dizziness.  Dispense: 30 tablet; Refill: 2  2. Unable to afford copayment for visit - AMB Referral to Chronic Care Management Services  3. Food insecurity - AMB Referral to Chronic Care Management Services   Follow up plan: Return for as scheduled with PCP.  [name], MSN, APRN, FNP-C  Subjective:  HPI: Valerie [name] is a 62 y.o. female presenting on 01/11/2023 for Dizziness (off/on since DEC - very bad x 1 week )  Patient presents for evaluation of dizziness. The symptoms started 6 months ago and have waxed and waned. The attacks occur  every other week  and last a few  days . Positions that worsen symptoms: lying down. Previous workup/treatments: MRI of the brain. Associated ear symptoms: none. Associated CNS symptoms:  nausea . Recent infections: none. Head trauma: denied. Drug ingestion: anti-motion sickness and sedatives. Noise exposure: no occupational exposure. She has tried taking meclizine and zofran, which have not been helpful.    ROS: Negative unless specifically indicated above in HPI.   Relevant past medical history reviewed and updated as indicated.   Allergies and medications reviewed and updated.   Current Outpatient Medications:    ALPRAZolam (XANAX) 0.5 MG tablet, Take 1/2 to 1 tablet TID prn, Disp: , Rfl:    Ascorbic Acid (VITAMIN C) 1000 MG tablet, Take 1,000 mg by mouth at bedtime., Disp: , Rfl:    carbidopa-levodopa (SINEMET IR) 25-100 MG tablet, Take by mouth., Disp: , Rfl:    Cholecalciferol (VITAMIN D-3) 1000 units CAPS, Take 2,000 Units by mouth daily., Disp: , Rfl:    Cyanocobalamin (VITAMIN B-12 PO),  Take 1 tablet by mouth daily., Disp: , Rfl:    docusate sodium (COLACE) 100 MG capsule, Take 200 mg by mouth at bedtime., Disp: , Rfl:    donepezil (ARICEPT) 10 MG tablet, Take 10 mg by mouth daily., Disp: , Rfl:    fluconazole (DIFLUCAN) 150 MG tablet, Take 1 tablet (150 mg total) by mouth every 3 (three) days., Disp: 5 tablet, Rfl: 1   FLUoxetine (PROZAC) 40 MG capsule, Take 40 mg by mouth daily., Disp: , Rfl:    HYDROcodone-acetaminophen (NORCO) 10-325 MG tablet, Take 0.5-1 tablets by mouth every 6 (six) hours as needed for moderate pain or severe pain., Disp: 20 tablet, Rfl: 0   lamoTRIgine (LAMICTAL) 150 MG tablet, Take 1 tablet by mouth daily., Disp: , Rfl:    lamoTRIgine (LAMICTAL) 200 MG tablet, Take 1 tablet by mouth at bedtime., Disp: , Rfl:    levETIRAcetam (KEPPRA) 750 MG tablet, Take 750 mg by mouth 2 (two) times daily., Disp: , Rfl:    levocetirizine (XYZAL) 5 MG tablet, Take 5 mg by mouth every evening., Disp: , Rfl:    meclizine (ANTIVERT) 12.5 MG tablet, Take 1 tablet (12.5 mg total) by mouth 3 (three) times daily as needed for dizziness., Disp: 30 tablet, Rfl: 0   memantine (NAMENDA) 10 MG tablet, TAKE 1 TABLET BY MOUTH EVERY DAY FOR 7 DAYS THEN TAKE 1 TABLET BY MOUTH TWICE DAILY THEREAFTER, Disp: , Rfl:    omeprazole (PRILOSEC) 40 MG capsule, Take 1 capsule (40 mg total)  by mouth 2 (two) times daily before a meal., Disp: 180 capsule, Rfl: 0   oxybutynin (DITROPAN) 5 MG tablet, Take 1 tablet (5 mg total) by mouth 2 (two) times daily., Disp: 90 tablet, Rfl: 3   predniSONE (DELTASONE) 5 MG tablet, Take 5 mg by mouth daily with breakfast., Disp: , Rfl:    primidone (MYSOLINE) 50 MG tablet, Take by mouth., Disp: , Rfl:    rizatriptan (MAXALT-MLT) 10 MG disintegrating tablet, Take 10 mg by mouth See admin instructions. Take one tablet (10 mg) by mouth daily as needed for migraine. May repeat in 2 hours if still needed, Disp: , Rfl:    rosuvastatin (CRESTOR) 10 MG tablet, Take 1 tablet  (10 mg total) by mouth daily., Disp: 90 tablet, Rfl: 3   traZODone (DESYREL) 100 MG tablet, Take 2 tablets (200 mg total) by mouth at bedtime., Disp: 60 tablet, Rfl: 0   nitroGLYCERIN (NITROSTAT) 0.4 MG SL tablet, Place 1 tablet (0.4 mg total) under the tongue every 5 (five) minutes as needed for chest pain. (Patient not taking: Reported on 01/11/2023), Disp: 30 tablet, Rfl: 2   Probiotic Product (PROBIOTIC DAILY PO), Take 1 tablet by mouth daily. (Patient not taking: Reported on 01/11/2023), Disp: , Rfl:   Allergies  Allergen Reactions   Carbamazepine Other (See Comments)    Parkinsons like symptoms tremors   Sertraline Hcl Other (See Comments)    Unknown reaction    Objective:   BP 102/66   Pulse 72   Temp 98.5 F (36.9 C)   Resp 20   Ht 5\' 2"  (1.575 m)   Wt 197 lb (89.4 kg)   BMI 36.03 kg/m    Physical Exam Vitals reviewed.  Constitutional:      General: She is not in acute distress.    Appearance: Normal appearance. She is not ill-appearing, toxic-appearing or diaphoretic.  HENT:     Head: Normocephalic and atraumatic.     Right Ear: Tympanic membrane, ear canal and external ear normal. There is no impacted cerumen.     Left Ear: Tympanic membrane, ear canal and external ear normal. There is no impacted cerumen.  Eyes:     General: No scleral icterus.       Right eye: No discharge.        Left eye: No discharge.     Extraocular Movements: Extraocular movements intact.     Right eye: Normal extraocular motion.     Left eye: Normal extraocular motion.     Conjunctiva/sclera: Conjunctivae normal.  Cardiovascular:     Rate and Rhythm: Normal rate.  Pulmonary:     Effort: Pulmonary effort is normal. No respiratory distress.  Musculoskeletal:        General: Normal range of motion.     Cervical back: Normal range of motion.  Skin:    General: Skin is warm and dry.     Capillary Refill: Capillary refill takes less than 2 seconds.  Neurological:     General: No focal  deficit present.     Mental Status: She is alert and oriented to person, place, and time. Mental status is at baseline.     Gait: Gait abnormal (ambulates with cane).  Psychiatric:        Mood and Affect: Mood normal.        Behavior: Behavior normal.        Thought Content: Thought content normal.        Judgment: Judgment normal.

## 2023-01-11 NOTE — Telephone Encounter (Signed)
Called pt made appt for 1:00 w/Valerie [name].Marland KitchenRaechel Chute

## 2023-01-14 ENCOUNTER — Telehealth: Payer: Self-pay

## 2023-01-14 NOTE — Progress Notes (Signed)
  Care Coordination  Note  01/14/2023 Name: Jayse Hammond MRN: [id number] DOB: [birth year]  Yareliz Hackl is a 62 y.o. year old female who is a primary care patient of Okey Dupre Austin Miles, MD. I reached out to [name] by phone today to offer care coordination services.      Ms. Larrow was given information about Care Coordination services today including:  The Care Coordination services include support from the care team which includes your Nurse Coordinator, Clinical Social Worker, or Pharmacist.  The Care Coordination team is here to help remove barriers to the health concerns and goals most important to you. Care Coordination services are voluntary and the patient may decline or stop services at any time by request to their care team member.   Patient agreed to services and verbal consent obtained.   Follow up plan: Telephone appointment with care coordination team member scheduled [id number] 01/22/2023  Penne Lash, RMA Care Guide [facility]  [place], [place] [address] Direct Dial: [phone] [email or web address]

## 2023-01-19 ENCOUNTER — Telehealth: Payer: Self-pay | Admitting: Internal Medicine

## 2023-01-19 NOTE — Telephone Encounter (Signed)
Patient states that she has not received the refill that wass on the 5mg  predisone - Please send to [facility] on [address] [facility] [address].

## 2023-01-19 NOTE — Telephone Encounter (Signed)
We prescribed this with refills in April. Should not need refills. If we are prescribing long term she should schedule next visit will need every 3 months.

## 2023-01-19 NOTE — Telephone Encounter (Signed)
I see that Asher Muir wrote this prescription on 5/20 ok to resend in?

## 2023-01-20 DIAGNOSIS — E119 Type 2 diabetes mellitus without complications: Secondary | ICD-10-CM | POA: Diagnosis not present

## 2023-01-20 DIAGNOSIS — Z79899 Other long term (current) drug therapy: Secondary | ICD-10-CM | POA: Diagnosis not present

## 2023-01-20 DIAGNOSIS — Z961 Presence of intraocular lens: Secondary | ICD-10-CM | POA: Diagnosis not present

## 2023-01-20 DIAGNOSIS — H43813 Vitreous degeneration, bilateral: Secondary | ICD-10-CM | POA: Diagnosis not present

## 2023-01-20 DIAGNOSIS — H04123 Dry eye syndrome of bilateral lacrimal glands: Secondary | ICD-10-CM | POA: Diagnosis not present

## 2023-01-20 LAB — HM DIABETES EYE EXAM

## 2023-01-20 NOTE — Telephone Encounter (Signed)
Spoke with the pharmacy and they stated they do not have refills on the 5 mg for the patient but do have refills on the 10 mg for patient which is currently ready for pickup but the patient stated that she shakes really bad that she doesn't feel comfortable cutting the 10 mg in half.  The pharmacy said if you feel appropriate for filling the 5 mg they would need a new prescription sent in.

## 2023-01-21 NOTE — Telephone Encounter (Signed)
I recommend visit if she is wanting [place] to prescribe and is having problems. I typically will not prescribe this long term it is best for her rheumatologist to prescribe this as it is not a great medicine to be on long term unless there are no better options. Does she has a current rheumatologist? If not I will see her in office and place referral for long term management.

## 2023-01-21 NOTE — Telephone Encounter (Signed)
LVM for pt to call back regarding providers message.

## 2023-01-22 ENCOUNTER — Ambulatory Visit: Payer: Medicare Other

## 2023-01-25 NOTE — Telephone Encounter (Signed)
Spoke with pt and and relayed message from Dr. Okey Dupre. she did pick of the 10mg  prednisone but is cutting it in half until she has her appt with Dr. Okey Dupre on 02/10/2023 @ 10:40am she had no further questions.

## 2023-01-26 ENCOUNTER — Encounter: Payer: Medicare Other | Admitting: Licensed Clinical Social Worker

## 2023-01-27 ENCOUNTER — Ambulatory Visit: Payer: Self-pay | Admitting: Licensed Clinical Social Worker

## 2023-01-27 ENCOUNTER — Encounter: Payer: Self-pay | Admitting: Internal Medicine

## 2023-01-27 NOTE — Patient Outreach (Signed)
Care Coordination   Initial Visit Note   01/27/2023 Name: Valerie [name] MRN: [id number] DOB: Apr 29, 1961  Valerie [name] is a 62 y.o. year old female who sees Myrlene Broker, MD for primary care. I spoke with  [name] by phone today.  What matters to the patients health and wellness today?  Patient has stress related to finances, related to  buying medications, and related to procuring food    Goals Addressed             This Visit's Progress    Patient Stated she has stress related to finances, related to buying medications, and related to procuring food       Interventions:  Spoke with client about client needs Discussed program support Discussed anxiety and stress issues of client. She has difficulty paying for food and medications. She wants to sell her townhome if she is able to do so. She wants to move closer to her children.  Daughter lives in [place], [place]; son resides in [place], [place] Discussed medication procurement Discussed fall risk . She has fallen recently. She has no serious injuries, only sore from fall Discussed transport. She has a friend who transports her to and from her medical appointments. She said she has many medical appointments. She said paying co-payments at these medical appointments is difficult Provided counseling support Discussed relaxation techniques. She likes collecting antiques and selling antiques. She likes talking with friends at complex where she lives Client said she is taking medications as prescribed.   Discussed finances. She owes money to hospital for past care. LCSW talked with [name] about her possibly talking with financial counselor at hospital to see if she could set up payment plan to hospital. Discussed support from her daughter and from her son Encouraged client to call LCSW as needed for SW support at [phone].        SDOH assessments and interventions completed:  Yes  SDOH Interventions Today     Flowsheet Row Most Recent Value  SDOH Interventions   Depression Interventions/Treatment  Currently on Treatment, Medication  Physical Activity Interventions Other (Comments)  [may have some walking challenges]  Stress Interventions Other (Comment)  [client has financial stress,  client has stress related to managing medical needs]        Care Coordination Interventions:  Yes, provided    Interventions Today    Flowsheet Row Most Recent Value  Chronic Disease   Chronic disease during today's visit Other  [spoke with client about client needs]  General Interventions   General Interventions Discussed/Reviewed General Interventions Discussed, Community Resources  [spoke with client about program support]  Exercise Interventions   Exercise Discussed/Reviewed Physical Activity  [uses rollator walker to help her walk]  Physical Activity Discussed/Reviewed Physical Activity Discussed  [at risk for falls]  Mental Health Interventions   Mental Health Discussed/Reviewed Anxiety, Coping Strategies  [discussed stress and anxiety issues. She has financial stress,  she has difficulty paying for medications. Wants to try to sell her townhome. At risk for falls. She feels that she is anxious from time to time. She is taking medications as prescribed]  Nutrition Interventions   Nutrition Discussed/Reviewed Nutrition Discussed  [food scarcity]  Pharmacy Interventions   Pharmacy Dicussed/Reviewed Pharmacy Topics Discussed  Safety Interventions   Safety Discussed/Reviewed Fall Risk  [had recent fall. No serious injury]       Follow up plan: Follow up call scheduled for 02/16/23 at 3:30 PM     Encounter Outcome:  Pt.  Visit Completed   [name] MSW, LCSW Licensed Visual merchandiser [facility] Care Management [phone]

## 2023-01-27 NOTE — Patient Instructions (Signed)
Visit Information  Thank you for taking time to visit with me today. Please don't hesitate to contact me if I can be of assistance to you.   Following are the goals we discussed today:   Goals Addressed             This Visit's Progress    Patient Stated she has stress related to finances, related to buying medications, and related to procuring food       Interventions:  Spoke with client about client needs Discussed program support Discussed anxiety and stress issues of client. She has difficulty paying for food and medications. She wants to sell her townhome if she is able to do so. She wants to move closer to her children.  Daughter lives in [place], [place]; son resides in [place], [place] Discussed medication procurement Discussed fall risk . She has fallen recently. She has no serious injuries, only sore from fall Discussed transport. She has a friend who transports her to and from her medical appointments. She said she has many medical appointments. She said paying co-payments at these medical appointments is difficult Provided counseling support Discussed relaxation techniques. She likes collecting antiques and selling antiques. She likes talking with friends at complex where she lives Client said she is taking medications as prescribed.   Discussed finances. She owes money to hospital for past care. LCSW talked with [name] about her possibly talking with financial counselor at hospital to see if she could set up payment plan to hospital. Discussed support from her daughter and from her son Encouraged client to call LCSW as needed for SW support at [phone].        Our next appointment is by telephone on 02/16/23 at 3:30 PM   Please call the care guide team at [phone] if you need to cancel or reschedule your appointment.   If you are experiencing a Mental Health or Behavioral Health Crisis or need someone to talk to, please go to [facility] [address] [place] [phone])   The patient verbalized understanding of instructions, educational materials, and care plan provided today and DECLINED offer to receive copy of patient instructions, educational materials, and care plan.   The patient has been provided with contact information for the care management team and has been advised to call with any health related questions or concerns.   Kelton Pillar.Tenaya Hilyer MSW, LCSW Licensed Visual merchandiser [facility] Care Management [phone]

## 2023-02-02 ENCOUNTER — Other Ambulatory Visit: Payer: Self-pay | Admitting: Internal Medicine

## 2023-02-02 DIAGNOSIS — Z1231 Encounter for screening mammogram for malignant neoplasm of breast: Secondary | ICD-10-CM

## 2023-02-03 DIAGNOSIS — G4733 Obstructive sleep apnea (adult) (pediatric): Secondary | ICD-10-CM | POA: Diagnosis not present

## 2023-02-04 ENCOUNTER — Encounter: Payer: Self-pay | Admitting: Internal Medicine

## 2023-02-10 ENCOUNTER — Ambulatory Visit (INDEPENDENT_AMBULATORY_CARE_PROVIDER_SITE_OTHER): Payer: Medicare Other | Admitting: Internal Medicine

## 2023-02-10 ENCOUNTER — Encounter: Payer: Self-pay | Admitting: Internal Medicine

## 2023-02-10 VITALS — BP 120/80 | HR 73 | Temp 98.3°F | Ht 62.0 in | Wt 201.0 lb

## 2023-02-10 DIAGNOSIS — N1832 Chronic kidney disease, stage 3b: Secondary | ICD-10-CM

## 2023-02-10 DIAGNOSIS — E1140 Type 2 diabetes mellitus with diabetic neuropathy, unspecified: Secondary | ICD-10-CM | POA: Diagnosis not present

## 2023-02-10 DIAGNOSIS — E213 Hyperparathyroidism, unspecified: Secondary | ICD-10-CM

## 2023-02-10 DIAGNOSIS — S51801A Unspecified open wound of right forearm, initial encounter: Secondary | ICD-10-CM | POA: Diagnosis not present

## 2023-02-10 DIAGNOSIS — W19XXXA Unspecified fall, initial encounter: Secondary | ICD-10-CM

## 2023-02-10 DIAGNOSIS — E1169 Type 2 diabetes mellitus with other specified complication: Secondary | ICD-10-CM | POA: Diagnosis not present

## 2023-02-10 DIAGNOSIS — Z0001 Encounter for general adult medical examination with abnormal findings: Secondary | ICD-10-CM

## 2023-02-10 DIAGNOSIS — E042 Nontoxic multinodular goiter: Secondary | ICD-10-CM

## 2023-02-10 DIAGNOSIS — L4050 Arthropathic psoriasis, unspecified: Secondary | ICD-10-CM

## 2023-02-10 DIAGNOSIS — R251 Tremor, unspecified: Secondary | ICD-10-CM | POA: Diagnosis not present

## 2023-02-10 DIAGNOSIS — E785 Hyperlipidemia, unspecified: Secondary | ICD-10-CM

## 2023-02-10 LAB — COMPREHENSIVE METABOLIC PANEL
ALT: 3 U/L (ref 0–35)
AST: 12 U/L (ref 0–37)
Albumin: 3.9 g/dL (ref 3.5–5.2)
Alkaline Phosphatase: 63 U/L (ref 39–117)
BUN: 20 mg/dL (ref 6–23)
CO2: 29 mEq/L (ref 19–32)
Calcium: 9.3 mg/dL (ref 8.4–10.5)
Chloride: 103 mEq/L (ref 96–112)
Creatinine, Ser: 1.6 mg/dL — ABNORMAL HIGH (ref 0.40–1.20)
GFR: 34.18 mL/min — ABNORMAL LOW (ref >60.00)
Glucose, Bld: 81 mg/dL (ref 70–99)
Potassium: 4 mEq/L (ref 3.5–5.1)
Sodium: 140 mEq/L (ref 135–145)
Total Bilirubin: 0.3 mg/dL (ref 0.2–1.2)
Total Protein: 6.4 g/dL (ref 6.0–8.3)

## 2023-02-10 LAB — LIPID PANEL
Cholesterol: 156 mg/dL (ref 0–200)
HDL: 48.3 mg/dL (ref >39.00)
LDL Cholesterol: 75 mg/dL (ref 0–99)
NonHDL: 107.63
Total CHOL/HDL Ratio: 3
Triglycerides: 162 mg/dL — ABNORMAL HIGH (ref 0.0–149.0)
VLDL: 32.4 mg/dL (ref 0.0–40.0)

## 2023-02-10 LAB — CBC
HCT: 35.9 % — ABNORMAL LOW (ref 36.0–46.0)
Hemoglobin: 11.8 g/dL — ABNORMAL LOW (ref 12.0–15.0)
MCHC: 32.9 g/dL (ref 30.0–36.0)
MCV: 98.8 fl (ref 78.0–100.0)
Platelets: 259 10*3/uL (ref 150.0–400.0)
RBC: 3.64 Mil/uL — ABNORMAL LOW (ref 3.87–5.11)
RDW: 15 % (ref 11.5–15.5)
WBC: 8.8 10*3/uL (ref 4.0–10.5)

## 2023-02-10 LAB — MICROALBUMIN / CREATININE URINE RATIO
Creatinine,U: 101.9 mg/dL
Microalb Creat Ratio: 2 mg/g (ref 0.0–30.0)
Microalb, Ur: 2.1 mg/dL — ABNORMAL HIGH (ref 0.0–1.9)

## 2023-02-10 LAB — HEMOGLOBIN A1C: Hgb A1c MFr Bld: 5.3 % (ref 4.6–6.5)

## 2023-02-10 LAB — TSH: TSH: 3.45 u[IU]/mL (ref 0.35–5.50)

## 2023-02-10 MED ORDER — LINACLOTIDE 145 MCG PO CAPS: 145.0000 ug | ORAL_CAPSULE | Freq: Every day | ORAL | 3 refills | Status: DC

## 2023-02-10 MED ORDER — PREDNISONE 5 MG PO TABS: 5.0000 mg | ORAL_TABLET | Freq: Every day | ORAL | 1 refills | Status: DC

## 2023-02-10 NOTE — Patient Instructions (Addendum)
Ask the neurologist about the parkinson's testing.   We have sent in linzess to take 1 pill daily   We will get you in with the rheumatologist for the psoriatic arthritis for different treatment

## 2023-02-10 NOTE — Progress Notes (Signed)
Subjective:   Patient ID: Valerie [name], female    DOB: 14-Jan-1961, 62 y.o.   MRN: [id number]  HPI Here for medicare wellness, with new complaints. Please see A/P for status and treatment of chronic medical problems.   Diet: DM since diabetic Physical activity: sedentary Depression/mood screen: negative Hearing: intact to whispered voice Visual acuity: grossly normal with lens, performs annual eye exam  ADLs: capable Fall risk: low several recent falls, now  using rollator Home safety: good Cognitive evaluation: intact to orientation, naming, recall and repetition EOL planning: adv directives discussed  Flowsheet Row Care Coordination from 01/27/2023 in [facility] Care Coordination  PHQ-2 Total Score 2       Flowsheet Row Care Coordination from 01/27/2023 in [facility] Care Coordination  PHQ-9 Total Score 8         10/21/2022    8:38 AM 12/04/2022    1:46 PM 01/11/2023    1:16 PM 01/27/2023   10:56 AM 02/10/2023   10:50 AM  Fall Risk  Falls in the past year? 1 0 1 1 1   Was there an injury with Fall? 1 0 0 0 1  Fall Risk Category Calculator 2 0 1 2 2   Patient at Risk for Falls Due to   Impaired balance/gait    Fall risk Follow up Falls evaluation completed Falls evaluation completed Falls evaluation completed  Falls evaluation completed    I have personally reviewed and have noted 1. The patient's medical and social history - reviewed today no changes 2. Their use of alcohol, tobacco or illicit drugs 3. Their current medications and supplements 4. The patient's functional ability including ADL's, fall risks, home safety risks and hearing or visual impairment. 5. Diet and physical activities 6. Evidence for depression or mood disorders 7. Care team reviewed and updated 8.  The patient is on an opioid pain medication and this was reviewed with patient and non-opioid pain medication options were reviewed and offered to patient,  their pain treatment plan and severity was discussed with them. We have considered referrals as appropriate for patient. Opioid risk factors were also considered and reviewed.   Patient Care Team: Myrlene Broker, MD as PCP - General (Internal Medicine) Parke Poisson, MD as PCP - Cardiology (Cardiology) Jannifer Hick, MD as Consulting Physician (Urology) Karie Soda, MD as Consulting Physician (Colon and Rectal Surgery) Jeani Hawking, MD as Consulting Physician (Gastroenterology) Parke Poisson, MD as Consulting Physician (Cardiology) Jannifer Hick, MD as Consulting Physician (Urology) Carlus Pavlov, MD as Consulting Physician (Endocrinology) Raymondo Band, MD as Consulting Physician (Infectious Diseases) Past Medical History:  Diagnosis Date   Anemia    Anxiety    Arthritis    Psoriatic   Bipolar disorder (HCC)    CKD (chronic kidney disease), stage III (HCC)    Colovesical fistula    Depression    DM type 2 (diabetes mellitus, type 2) (HCC) 11/14/2019   Dyspnea    Headache    Neuropathy    NSTEMI (non-ST elevated myocardial infarction) (HCC) 11/14/2019   OSA (obstructive sleep apnea)    Psoriatic arthritis (HCC)    Sleep apnea    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   Stroke (HCC)    ?history; MRI normal   Past Surgical History:  Procedure Laterality Date   ABDOMINAL HYSTERECTOMY     APPENDECTOMY     CHOLECYSTECTOMY     CYSTOSCOPY WITH STENT PLACEMENT N/A 07/17/2020  Procedure: CYSTOSCOPY WITH BILATERAL FIREFLY INJECTION;  Surgeon: Heloise Purpura, MD;  Location: [facility];  Service: Urology;  Laterality: N/A;   FLEXIBLE SIGMOIDOSCOPY N/A 05/17/2020   Procedure: FLEXIBLE SIGMOIDOSCOPY;  Surgeon: Jeani Hawking, MD;  Location: [facility];  Service: Endoscopy;  Laterality: N/A;   FOOT SURGERY Right    IR FLUORO GUIDE CV LINE RIGHT  08/19/2020   IR RADIOLOGIST EVAL & MGMT  09/10/2020   IR RADIOLOGIST EVAL & MGMT  10/10/2020   IR REMOVAL TUN CV CATH  W/O FL  09/16/2020   IR SINUS/FIST TUBE CHK-NON GI  10/24/2020   IR SINUS/FIST TUBE CHK-NON GI  10/31/2020   IR US GUIDE VASC ACCESS RIGHT  08/19/2020   LEFT HEART CATH AND CORONARY ANGIOGRAPHY N/A 11/14/2019   Procedure: LEFT HEART CATH AND CORONARY ANGIOGRAPHY;  Surgeon: Swazil[place], Peter M, MD;  Location: [facility] INVASIVE CV LAB;  Service: Cardiovascular;  Laterality: N/A;   PARATHYROID EXPLORATION N/A 01/09/2021   Procedure: POSSIBLE NECK EXPLORATION;  Surgeon: Darnell Level, MD;  Location: [facility];  Service: General;  Laterality: N/A;   PARATHYROIDECTOMY Left 01/09/2021   Procedure: LEFT INFERIOR PARATHYROIDECTOMY;  Surgeon: Darnell Level, MD;  Location: [facility];  Service: General;  Laterality: Left;   PROCTOSCOPY N/A 07/17/2020   Procedure: RIGID PROCTOSCOPY;  Surgeon: Karie Soda, MD;  Location: [facility];  Service: General;  Laterality: N/A;   TONSILLECTOMY     TOTAL HIP ARTHROPLASTY Bilateral    Family History  Problem Relation Age of Onset   Breast cancer Neg Hx    Review of Systems  Constitutional:  Positive for activity change and fatigue.  HENT: Negative.    Eyes: Negative.   Respiratory:  Negative for cough, chest tightness and shortness of breath.   Cardiovascular:  Negative for chest pain, palpitations and leg swelling.  Gastrointestinal:  Positive for abdominal pain, constipation and nausea. Negative for abdominal distention, diarrhea and vomiting.  Musculoskeletal:  Positive for arthralgias, gait problem, joint swelling and myalgias.  Skin: Negative.   Neurological:  Positive for tremors.  Psychiatric/Behavioral: Negative.      Objective:  Physical Exam Constitutional:      Appearance: She is well-developed. She is obese.  HENT:     Head: Normocephalic and atraumatic.  Cardiovascular:     Rate and Rhythm: Normal rate and regular rhythm.  Pulmonary:     Effort: Pulmonary effort is normal. No respiratory distress.     Breath sounds: Normal breath sounds. No wheezing or rales.   Abdominal:     General: Bowel sounds are normal. There is no distension.     Palpations: Abdomen is soft.     Tenderness: There is no abdominal tenderness. There is no rebound.  Musculoskeletal:        General: Tenderness present.     Cervical back: Normal range of motion.  Skin:    General: Skin is warm and dry.  Neurological:     Mental Status: She is alert and oriented to person, place, and time.     Coordination: Coordination abnormal.     Vitals:   02/10/23 1049  BP: 120/80  Pulse: 73  Temp: 98.3 F (36.8 C)  TempSrc: Oral  SpO2: 96%  Weight: 201 lb (91.2 kg)  Height: 5\' 2"  (1.575 m)    Assessment & Plan:

## 2023-02-11 DIAGNOSIS — Z0001 Encounter for general adult medical examination with abnormal findings: Secondary | ICD-10-CM | POA: Insufficient documentation

## 2023-02-11 DIAGNOSIS — R251 Tremor, unspecified: Secondary | ICD-10-CM | POA: Insufficient documentation

## 2023-02-11 DIAGNOSIS — W19XXXA Unspecified fall, initial encounter: Secondary | ICD-10-CM | POA: Insufficient documentation

## 2023-02-11 NOTE — Assessment & Plan Note (Signed)
I was not understanding her prior treatment last visit I thought she already had a rheumatologist who prescribed her prednisone 5 mg daily however her prior PCP was prescribing her embrel (long ago) and plaquenil in the past and she has never seen a rheumatologist. Her psoriatic arthritis is not under good control and is impacting her mobility and safety at home. Urgent referral done to rheumatology she should be on a DMARD and wean prednisone as able. She uses rare hydrocodone for pain and mostly tylenol. She is not able to take NSAIDs due to CKD.

## 2023-02-11 NOTE — Assessment & Plan Note (Signed)
Checking TSH with lab draw.

## 2023-02-11 NOTE — Assessment & Plan Note (Signed)
Checking CMP for stability today.

## 2023-02-11 NOTE — Assessment & Plan Note (Signed)
Checking CMP for stability.  

## 2023-02-11 NOTE — Assessment & Plan Note (Signed)
Flu shot yearly. Shingrix likely due need records. Tetanus likely due need records. Colonoscopy need to get records. Mammogram up to date due 2025, pap smear aged out prior to recall and dexa due 2026. Counseled about sun safety and mole surveillance. Counseled about the dangers of distracted driving. Given 10 year screening recommendations.

## 2023-02-11 NOTE — Assessment & Plan Note (Signed)
Checking lipid panel and adjust crestor 10 mg daily as needed.  

## 2023-02-11 NOTE — Assessment & Plan Note (Signed)
Checking HgA1c, lipid panel, microalbumin to creatinine ratio. She has had eye exam we need records. Is taking no medication for this currently but is on chronic prednisone. Taking statin. Adjust as needed.

## 2023-02-11 NOTE — Assessment & Plan Note (Signed)
She has a neurologist who is prescribing carbidopa/levodopa and her daughter is not pleased with their care. They do not know if she has parkinson's but is giving her this medicine to see if it helps. She has visit next week and daughter will attend that visit to see if they can clarify and confirm her condition. It is possible her mental health medications could have caused the tremor.

## 2023-02-11 NOTE — Assessment & Plan Note (Signed)
Skin tear right forearm examined and dressed. No wound closure needed as 3 days prior injury. No signs of infection counseled about wound care needed for this.

## 2023-02-12 ENCOUNTER — Telehealth: Payer: Medicare Other

## 2023-02-16 ENCOUNTER — Ambulatory Visit: Payer: Self-pay | Admitting: Licensed Clinical Social Worker

## 2023-02-16 DIAGNOSIS — M7912 Myalgia of auxiliary muscles, head and neck: Secondary | ICD-10-CM | POA: Diagnosis not present

## 2023-02-16 NOTE — Patient Instructions (Signed)
Visit Information  Thank you for taking time to visit with me today. Please don't hesitate to contact me if I can be of assistance to you.   Following are the goals we discussed today:   Goals Addressed             This Visit's Progress    Patient Stated she has stress related to finances, related to buying medications, and related to procuring food       Interventions:  Spoke with [name] Speers, daughter of client, via phone today Discussed client needs. [name] said client has fallen several times recently. [name] said client has appointment today with neurologist to discuss client needs and status Reviewed program support for client with RN, Pharmacist and LCSW Discussed anxiety and stress issues of client. [name] said client is fearful of falling Client has had several falls recently. [place]home where client lives has steps from one level to another level.  [name] feels that client may be having falls when going up and down stairs at her Townhome. Discussed medication procurement Discussed ambulation of client. Client uses rollator walker to ambulate.  Client has some difficulty when leaving her home. Difficulty standing for periods of time and difficulty walking for certain distances Discussed relaxation techniques of client. 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 Client has support from her daughter and from her son Encouraged client  or Matea Stanard to call LCSW as needed for SW support for client at [phone].           Our next appointment is by telephone on 04/19/23 at 2:00 PM   Please call the care guide team at [phone] if you need to cancel or reschedule your appointment.   If you are experiencing a Mental Health or Behavioral Health Crisis or need someone to talk to, please go to [facility] [address], [place] [phone])   The patient / Valerie [name], daughter,  verbalized understanding of instructions, educational materials, and care plan provided today and DECLINED offer to receive copy of patient instructions, educational materials, and care plan.   The patient / [name], daughter, has been provided with contact information for the care management team and has been advised to call with any health related questions or concerns.   Kelton Pillar.Arkel Cartwright MSW, LCSW Licensed Visual merchandiser [facility] Care Management [phone]

## 2023-02-16 NOTE — Patient Outreach (Signed)
  Care Coordination   Follow Up Visit Note   02/16/2023 Name: Valerie [name] MRN: [id number] DOB: 1961-06-01  Renleigh [name] is a 62 y.o. year old female who sees Myrlene Broker, MD for primary care. I spoke with  [name] / [name], daughter of client via phone today.  What matters to the patients health and wellness today? Patient has stress related to finances, related to buying medications and related to procuring food items    Goals Addressed             This Visit's Progress    Patient Stated she has stress related to finances, related to buying medications, and related to procuring food       Interventions:  Spoke with [name] Mcmeen, daughter of client, via phone today Discussed client needs. [name] said client has fallen several times recently. [name] said client has appointment today with neurologist to discuss client needs and status Reviewed program support for client with RN, Pharmacist and LCSW Discussed anxiety and stress issues of client. [name] said client is fearful of falling Client has had several falls recently. [place]home where client lives has steps from one level to another level.  [name] feels that client may be having falls when going up and down stairs at her Townhome. Discussed medication procurement Discussed ambulation of client. Client uses rollator walker to ambulate.  Client has some difficulty when leaving her home. Difficulty standing for periods of time and difficulty walking for certain distances Discussed relaxation techniques of client. 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 Client has support from her daughter and from her son Encouraged client  or [name] to call LCSW as needed for SW support for client at [phone].           SDOH assessments and interventions completed:  Yes  SDOH Interventions Today    Flowsheet Row Most Recent Value  SDOH Interventions    Depression Interventions/Treatment  Currently on Treatment, Medication  Physical Activity Interventions Other (Comments)  [uses a rollator to help her walk]  Stress Interventions Other (Comment)  [has stress related to finances,  has stress related to food procurement]        Care Coordination Interventions:  Yes, provided   Interventions Today    Flowsheet Row Most Recent Value  Chronic Disease   Chronic disease during today's visit Other  [spoke with [name], daughter, about client needs]  General Interventions   General Interventions Discussed/Reviewed General Interventions Discussed, Community Resources  [reviewed program support]  Exercise Interventions   Exercise Discussed/Reviewed Physical Activity  Physical Activity Discussed/Reviewed Physical Activity Discussed  [uses rollator walker, Fall risk]  Education Interventions   Education Provided Provided Education  Provided Engineer, [employer] program support. discussed in home care support for client. discussed client desire to move closer to her family]  Mental Health Interventions   Mental Health Discussed/Reviewed Anxiety  [client is sad occasionally due to health issues and financial issues]  Pharmacy Interventions   Pharmacy Dicussed/Reviewed Pharmacy Topics Discussed  Safety Interventions   Safety Discussed/Reviewed Fall Risk  [client is at risk for falls]        Follow up plan: Follow up call scheduled for 04/19/23 at 2:00 PM     Encounter Outcome:  Pt. Visit Completed   Kelton Pillar.Holley Kocurek MSW, LCSW Licensed Visual merchandiser [facility] Care Management [phone]

## 2023-02-26 ENCOUNTER — Encounter: Payer: Self-pay | Admitting: Internal Medicine

## 2023-03-15 DIAGNOSIS — Z1231 Encounter for screening mammogram for malignant neoplasm of breast: Secondary | ICD-10-CM

## 2023-03-24 DIAGNOSIS — Z8673 Personal history of transient ischemic attack (TIA), and cerebral infarction without residual deficits: Secondary | ICD-10-CM | POA: Diagnosis not present

## 2023-03-24 DIAGNOSIS — M7918 Myalgia, other site: Secondary | ICD-10-CM | POA: Diagnosis not present

## 2023-03-26 NOTE — Chronic Care Management (AMB) (Signed)
Error Please Disregard

## 2023-04-08 ENCOUNTER — Other Ambulatory Visit: Payer: Self-pay | Admitting: Internal Medicine

## 2023-04-14 DIAGNOSIS — G20A1 Parkinson's disease without dyskinesia, without mention of fluctuations: Secondary | ICD-10-CM | POA: Diagnosis not present

## 2023-04-19 ENCOUNTER — Ambulatory Visit: Payer: Medicare Other | Admitting: Internal Medicine

## 2023-04-19 ENCOUNTER — Ambulatory Visit: Payer: Self-pay | Admitting: Licensed Clinical Social Worker

## 2023-04-19 ENCOUNTER — Encounter: Payer: Self-pay | Admitting: Internal Medicine

## 2023-04-19 VITALS — BP 125/80 | HR 70 | Ht 62.0 in | Wt 188.8 lb

## 2023-04-19 DIAGNOSIS — E042 Nontoxic multinodular goiter: Secondary | ICD-10-CM

## 2023-04-19 DIAGNOSIS — E213 Hyperparathyroidism, unspecified: Secondary | ICD-10-CM

## 2023-04-19 LAB — VITAMIN D 25 HYDROXY (VIT D DEFICIENCY, FRACTURES): VITD: 60.11 ng/mL (ref 30.00–100.00)

## 2023-04-19 NOTE — Patient Instructions (Signed)
Visit Information  Thank you for taking time to visit with me today. Please don't hesitate to contact me if I can be of assistance to you.   Following are the goals we discussed today:   Goals Addressed             This Visit's Progress    Patient Stated she has stress related to finances, related to buying medications, and related to procuring food       Interventions:  Spoke with  client via phone today about client needs. Discussed program support with RN, LCSW, Pharmacist Discussed anxiety and stress issues of client. Client is at risk for falls but is not as fearful of falling as she had been Discussed medication procurement.  Client sometimes has some difficulty in paying for her medications Discussed ambulation of client. Client uses a cane daily to help her walk Discussed energy level of client. She said she feels her energy level is normal Discussed her activities at local gym. She said she uses treadmill at local gym and exercise bike at local gym. She said she goes to gym 4 times per week.  She said she feels that this has helped her energy level Discussed relaxation techniques of client. 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 She enjoys watching TV Discussed family support for client. Client has support from her daughter and from her son Discussed recent car repairs for client's car.  She drives her car as needed for transport needs Valerie [name] said she receives on line support from a therapist 1 time weekly Discussed food needs of client. Encouraged client  or Valerie [name] to call LCSW as needed for SW support for client at [phone].           Our next appointment is by telephone on 06/07/23 at 2:30 PM   Please call the care guide team at [phone] if you need to cancel or reschedule your appointment.   If you are experiencing a Mental Health or Behavioral Health Crisis or need someone to talk to, please go to [facility] [address] [place] [phone])   The patient verbalized understanding of instructions, educational materials, and care plan provided today and DECLINED offer to receive copy of patient instructions, educational materials, and care plan.   The patient has been provided with contact information for the care management team and has been advised to call with any health related questions or concerns.   Kelton Pillar.Clee Pandit MSW, LCSW Licensed Visual merchandiser [facility] Care Management [phone]

## 2023-04-19 NOTE — Progress Notes (Signed)
Patient ID: Valerie [name], female   DOB: [birth year], 62 y.o.   MRN: [id number]    HPI  Valerie [name] is a 62 y.o.-year-old female, initially referred by Dr. Roda Shutters, returning for follow-up for primary hyperparathyroidism and thyroid nodules.  Last visit 1 year ago  Interim history: Patient continues to have significant fatigue, joint pains, muscle aches. She has psoriatic arthritis >> prev. On  Enbrel, MTX, Prednisone (now 5 mg daily). Mostly shoulders, knees, hands.   She also has dementia-started Aricept and Namenda in 2023. She is on Keppra for seizures. Last episode was last week. She feels she has more imbalance after these episodes.  Reviewed history: At our first visit, patient presented after hospital admission for metabolic encephalopathy in the setting of hypercalcemia after having colovesical fistula repair in 06/2020. She has another fistula >> will have repair Sx in 09/2019. Before her first Sx, she was on TPN for 6 weeks.  Calcium levels were elevated in 10/2019 when she had an AMI and a stroke.  During her recent admission, calcium level was found to be as high as 12.8.  The PTH was not suppressed, at 73.   While in the hospital, she was given Zolendronic acid x1, calcitonin, and hydrated and her calcium returned to normal and then decreased to even the lower limit of normal.  Of note, calcitriol level, vitamin D, TSH, ACE, PTHrp, 24-hour urine calcium and spot Ca/Cr ratio were all normal.  Phosphorus was low (lowest 1.0).   I reviewed pt's pertinent labs: Lab Results  Component Value Date   PTH 47 04/16/2022   PTH 84 (H) 04/15/2021   PTH Comment 04/15/2021   PTH 113 (H) 12/03/2020   PTH Comment 12/03/2020   PTH 73 (H) 08/09/2020   PTH Comment 08/09/2020   CALCIUM 9.3 02/10/2023   CALCIUM 8.5 (L) 08/23/2022   CALCIUM 8.5 (L) 04/15/2021   CALCIUM 9.4 01/07/2021   CALCIUM 10.0 12/03/2020   CALCIUM 7.9 (L) 08/19/2020   CALCIUM 7.9 (L) 08/18/2020   CALCIUM 8.2 (L)  08/16/2020   CALCIUM 8.3 (L) 08/15/2020   CALCIUM 8.9 08/14/2020   CALCIUM 9.5 08/13/2020   CALCIUM 10.3 08/12/2020   CALCIUM 11.7 (H) 08/11/2020   CALCIUM 11.9 (H) 08/10/2020   CALCIUM 12.0 (H) 08/09/2020   CALCIUM 12.8 (H) 08/09/2020   CALCIUM 11.4 (H) 07/21/2020   CALCIUM 11.4 (H) 07/20/2020   CALCIUM 11.7 (H) 07/19/2020   CALCIUM 11.6 (H) 07/19/2020   CALCIUM 10.7 (H) 07/18/2020   CALCIUM 11.3 (H) 07/17/2020   CALCIUM 10.1 07/08/2020   CALCIUM 9.3 06/13/2020   CALCIUM 9.8 06/10/2020   CALCIUM 10.1 06/09/2020   CALCIUM 10.4 (H) 06/08/2020   CALCIUM 10.7 (H) 06/07/2020   CALCIUM 10.5 (H) 06/06/2020   CALCIUM 10.8 (H) 06/05/2020   CALCIUM 11.5 (H) 06/03/2020   CALCIUM 11.2 (H) 05/15/2020   CALCIUM 12.9 (H) 05/14/2020   CALCIUM 9.8 02/20/2020   CALCIUM 10.2 11/14/2019   CALCIUM 10.4 (H) 11/13/2019  02/04/2021: Calcium 9.0, PTH 72  Lab Results  Component Value Date   PHOS 3.0 08/18/2020   PHOS 2.2 (L) 08/16/2020   PHOS 1.9 (L) 08/15/2020   PHOS 1.8 (L) 08/14/2020   PHOS 1.0 (LL) 08/13/2020   PHOS 1.1 (L) 08/12/2020   PHOS 2.3 (L) 08/11/2020   PHOS 2.7 08/09/2020   PHOS 1.9 (L) 07/21/2020   PHOS 3.1 07/18/2020   Component     Latest Ref Rng & Units 08/16/2020  Magnesium     1.7 -  2.4 mg/dL 2.3   Component     Latest Ref Rng & Units 08/09/2020 08/10/2020 08/11/2020 08/14/2020  Calcium, Ur     Not Estab. mg/dL  6.0 6.2   Calcium/Creat.Ratio     29 - 442 mg/g creat  154    Creatinine, Urine     Not Estab. mg/dL  16.1    Calcium, 24 hour urine     0 - 320 mg/24 hr   143   Vit D, 1,25-Dihydroxy     19.9 - 79.3 pg/mL 33.2     PTH-related peptide     pmol/L   <2.0   Angiotensin-Converting Enzyme     14 - 82 U/L    41   FE Ca = 0.02  A thyroid ultrasound (01/05/2020) showed 4 nodules, but none of them reported as possible parathyroid adenomas: THYROID ULTRASOUND  INDICATION: Nontoxic single thyroid nodule thyroid nodules seen on carotid ultrasound performed  at doctor's office  COMPARISON: None  ISTHMUS:  - Size: 0.4 cm.   RIGHT LOBE:  - Size: 3.6 x 1.2 x 1.3 cm.  - Echogenicity: Normal.   LEFT LOBE:  - Size: 3.9 x 2.4 x 2.7 cm.  - Echogenicity: Normal.   NODULES:   - Nodule 1:  -- Size: 0.7 x 0.6 x 0.7 cm (long x AP x trans).  -- Location: Right mid.  -- Composition: spongiform (0)  -- Echogenicity: hypoechoic (2)  -- Shape: wider-than-tall (0)  -- Margins: ill-defined (0)  -- Echogenic foci: none (0)  -- ACR TI-RADS total points and risk category: 2 Points - TR2.   - Nodule 2:  -- Size: 2.4 x 1.6 x 2 cm (long x AP x trans).  -- Location: Left mid.  -- Composition: solid or almost completely solid (2)  -- Echogenicity: hypoechoic (2)  -- Shape: wider-than-tall (0)  -- Margins: ill-defined (0)  -- Echogenic foci: none (0)  -- ACR TI-RADS total points and risk category: 4 Points - TR4.   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 [facility] System Category II  - Nodule 3:  -- Size: 0.6 x 0.4 x 0.6 cm (long x AP x trans).  -- Location: Left lower.  -- Composition: cystic or almost completely cystic (0)   - Nodule 4:  -- Size: 0.8 x 0.6 x 0.7 cm (long x AP x trans).  -- Location: Left lower.  -- Composition: solid or almost completely solid (2)  -- Echogenicity: isoechoic (1)  -- Shape: wider-than-tall (0)  -- Margins: smooth (0)  -- Echogenic foci: none (0)  -- ACR TI-RADS total points and risk category: 3 Points - TR3.   Technetium sestamibi scan (11/23/2020): Washout of radiotracer activity from the thyroid gland at 2 hours. Small focus faint activity inferior to the LEFT lobe of thyroid gland is noted on planar imaging.   SPECT imaging of the neck and chest demonstrate a small focus activity which is faintly above the residual thyroid activit localizing to the lower pole of the  LEFT lobe of thyroid gland.   Inferior to the LEFT lower pole activity, there is a second small focus of activity which is very faint.   IMPRESSION: 1. No convincing evidence parathyroid adenoma adenoma. 2. Focal activity in the lower pole of the LEFT lobe of thyroid gland and small focus inferior to LEFT lobe of thyroid gland would be most likely  location of parathyroid adenomas. Consider CT of the neck with contrast for further evaluation.  4DCT (12/17/2020):  Possible left inferior parathyroid adenoma, measuring 9 mm  Left inferior parathyroidectomy (01/09/2021): final pathology: 0.465 mg hypercellular parathyroid tissue consistent with adenoma (1.0 x 0.8 x 0.7 cm)  Thyroid U/S (04/17/2021): Parenchymal Echotexture: Moderately heterogeneous Isthmus: 0.4 cm Right lobe: 3.9 x 1.2 x 1.9 cm Left lobe: 3.6 x 1.2 x 1.7 cm _________________________________________________________   Nodule 1: 0.7 cm spongiform nodule in the right mid thyroid lobe does not meet criteria for FNA or surveillance.  _________________________________________________________   Nodule # 2: Location: Left; mid Maximum size: 1.3 cm; Other 2 dimensions: 1.1 x 0.8 cm Composition: solid/almost completely solid (2) Echogenicity: isoechoic (1) Given size (<1.4 cm) and appearance, this nodule does NOT meet TI-RADS criteria for biopsy or dedicated follow-up.  _________________________________________________________   Nodule # 3: Location: Left; inferior Maximum size: 1.0 cm; Other 2 dimensions: 0.9 x 0.5 cm Composition: solid/almost completely solid (2) Echogenicity: hypoechoic (2)  *Given size (>/= 1 - 1.4 cm) and appearance, a follow-up ultrasound in 1 year should be considered based on TI-RADS criteria.  ________________________________________________________   IMPRESSION: Nodule 3 (TI-RADS 4) located in the inferior left thyroid lobe meets criteria for imaging follow-up. Annual ultrasound surveillance  is recommended until 5 years of stability is documented.  Not on B complex.  No previous DXA scan reports available for review. No h/o OP.  No fractures or falls.   + h/o kidney stone x1 "years ago".  +  CKD. Last BUN/Cr: Lab Results  Component Value Date   BUN 20 02/10/2023   BUN 13 08/23/2022   CREATININE 1.60 (H) 02/10/2023   CREATININE 1.59 (H) 08/23/2022   Pt is not on HCTZ.  No h/o vitamin D deficiency. Reviewed vit D levels: Lab Results  Component Value Date   VD25OH 41.30 04/16/2022   VD25OH 50.6 12/03/2020   VD25OH 68.85 08/09/2020   Pt was on vitamin D 1000 units daily - now on Centrum silver MVI (1000 Units)  Pt does not have a FH of hypercalcemia, pituitary tumors, thyroid cancer, or osteoporosis.   Pt. also has a history of CAD, history of NSTEMI.  She has a history of scurvy.  Also has a history of peripheral neuropathy, psoriatic arthritis, prediabetes, hyperlipidemia. She is on B12. 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  Tried Lyrica and Neurontin but this did not work well for her in the past. She gets steroid injections.  She also has leg weakness.  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   Stroke (HCC)    ?history; MRI normal   Past Surgical History:  Procedure Laterality Date   ABDOMINAL HYSTERECTOMY     APPENDECTOMY     CHOLECYSTECTOMY     CYSTOSCOPY WITH STENT PLACEMENT N/A 07/17/2020   Procedure: CYSTOSCOPY WITH BILATERAL FIREFLY INJECTION;  Surgeon: Heloise Purpura,  MD;  Location:  [facility];  Service: Urology;  Laterality: N/A;   FLEXIBLE SIGMOIDOSCOPY N/A 05/17/2020   Procedure: FLEXIBLE SIGMOIDOSCOPY;  Surgeon: Jeani Hawking, MD;  Location: [facility];  Service: Endoscopy;  Laterality: N/A;   FOOT SURGERY Right    IR FLUORO GUIDE CV LINE RIGHT  08/19/2020   IR RADIOLOGIST EVAL & MGMT  09/10/2020   IR RADIOLOGIST EVAL & MGMT  10/10/2020   IR REMOVAL TUN CV CATH W/O FL  09/16/2020   IR SINUS/FIST TUBE CHK-NON GI  10/24/2020   IR SINUS/FIST TUBE CHK-NON GI  10/31/2020   IR US GUIDE VASC ACCESS RIGHT  08/19/2020   LEFT HEART CATH AND CORONARY ANGIOGRAPHY N/A 11/14/2019   Procedure: LEFT HEART CATH AND CORONARY ANGIOGRAPHY;  Surgeon: Swazil[place], Peter M, MD;  [facility] INVASIVE CV LAB;  Service: Cardiovascular;  Laterality: N/A;   PARATHYROID EXPLORATION N/A 01/09/2021   Procedure: POSSIBLE NECK EXPLORATION;  Surgeon: Darnell Level, MD;  Location: [facility];  Service: General;  Laterality: N/A;   PARATHYROIDECTOMY Left 01/09/2021   Procedure: LEFT INFERIOR PARATHYROIDECTOMY;  Surgeon: Darnell Level, MD;  Location: [facility];  Service: General;  Laterality: Left;   PROCTOSCOPY N/A 07/17/2020   Procedure: RIGID PROCTOSCOPY;  Surgeon: Karie Soda, MD;  Location: [facility];  Service: General;  Laterality: N/A;   TONSILLECTOMY     TOTAL HIP ARTHROPLASTY Bilateral    Social History   Socioeconomic History   Marital status: Divorced    Spouse name: Not on file   Number of children: 2   Years of education: Not on file   Highest education level: Associate degree: occupational, Scientist, product/process development, or vocational program  Occupational History   Occupation: Disabled  Tobacco Use   Smoking status: Every Day    Current packs/day: 0.50    Types: Cigarettes   Smokeless tobacco: Never  Vaping Use   Vaping status: Never Used  Substance and Sexual Activity   Alcohol use: Not Currently   Drug use: Never   Sexual activity: Not on file  Other Topics Concern   Not on file  Social History Narrative   Not  on file   Social Determinants of Health   Financial Resource Strain: High Risk (01/11/2023)   Overall Financial Resource Strain (CARDIA)    Difficulty of Paying Living Expenses: Very hard  Food Insecurity: Food Insecurity Present (01/11/2023)   Hunger Vital Sign    Worried About Running Out of Food in the Last Year: Sometimes true    Ran Out of Food in the Last Year: Never true  Transportation Needs: Unmet Transportation Needs (12/04/2022)   PRAPARE - Transportation    Lack of Transportation (Medical): No    Lack of Transportation (Non-Medical): Yes  Physical Activity: Inactive (02/16/2023)   Exercise Vital Sign    Days of Exercise per Week: 0 days    Minutes of Exercise per Session: 0 min  Stress: Stress Concern Present (02/16/2023)   [employer] of Occupational Health - Occupational Stress Questionnaire    Feeling of Stress : To some extent  Social Connections: Socially Isolated (12/04/2022)   Social Connection and Isolation Panel [NHANES]    Frequency of Communication with Friends and Family: Once a week    Frequency of Social Gatherings with Friends and Family: Once a week    Attends Religious Services: Never    Database administrator or Organizations: No    Attends Engineer, structural: Not on file    Marital Status: Divorced  Intimate Partner Violence: Unknown (11/26/2021)   Received from [facility]   HITS    Physically Hurt: Not on file    Insult or Talk Down To: Not on file    Threaten Physical Harm: Not on file    Scream or Curse: Not on file   Current Outpatient Medications on File Prior to Visit  Medication Sig Dispense Refill   ALPRAZolam (XANAX) 0.5 MG tablet Take 1/2 to 1 tablet TID prn     Ascorbic Acid (VITAMIN C) 1000 MG tablet Take 1,000 mg by mouth at bedtime.     carbidopa-levodopa (SINEMET IR) 25-100 MG tablet Take by mouth.     Cholecalciferol (VITAMIN D-3) 1000 units CAPS Take 2,000 Units by mouth daily.     Cyanocobalamin  (VITAMIN B-12 PO) Take 1 tablet by mouth daily.     docusate sodium (COLACE) 100 MG capsule Take 200 mg by mouth at bedtime.     donepezil (ARICEPT) 10 MG tablet Take 10 mg by mouth daily.     fluconazole (DIFLUCAN) 150 MG tablet TAKE 1 TABLET( 150 MG) BY MOUTH EVERY 3 DAYS 5 tablet 1   FLUoxetine (PROZAC) 40 MG capsule Take 40 mg by mouth daily.     HYDROcodone-acetaminophen (NORCO) 10-325 MG tablet Take 0.5-1 tablets by mouth every 6 (six) hours as needed for moderate pain or severe pain. 20 tablet 0   lamoTRIgine (LAMICTAL) 150 MG tablet Take 1 tablet by mouth daily.     lamoTRIgine (LAMICTAL) 200 MG tablet Take 1 tablet by mouth at bedtime.     levETIRAcetam (KEPPRA) 750 MG tablet Take 750 mg by mouth 2 (two) times daily.     levocetirizine (XYZAL) 5 MG tablet Take 5 mg by mouth every evening.     linaclotide (LINZESS) 145 MCG CAPS capsule Take 1 capsule (145 mcg total) by mouth daily before breakfast. 30 capsule 3   meclizine (ANTIVERT) 25 MG tablet Take 1 tablet (25 mg total) by mouth 3 (three) times daily as needed for dizziness. 30 tablet 2   memantine (NAMENDA) 10 MG tablet TAKE 1 TABLET BY MOUTH EVERY DAY FOR 7 DAYS THEN TAKE 1 TABLET BY MOUTH TWICE DAILY THEREAFTER     nitroGLYCERIN (NITROSTAT) 0.4 MG SL tablet Place 1 tablet (0.4 mg total) under the tongue every 5 (five) minutes as needed for chest pain. 30 tablet 2   omeprazole (PRILOSEC) 40 MG capsule Take 1 capsule (40 mg total) by mouth 2 (two) times daily before a meal. 180 capsule 0   oxybutynin (DITROPAN) 5 MG tablet Take 1 tablet (5 mg total) by mouth 2 (two) times daily. 90 tablet 3   predniSONE (DELTASONE) 5 MG tablet Take 1 tablet (5 mg total) by mouth daily with breakfast. 90 tablet 1   primidone (MYSOLINE) 50 MG tablet Take by mouth.     Probiotic Product (PROBIOTIC DAILY PO) Take 1 tablet by mouth daily.     rizatriptan (MAXALT-MLT) 10 MG disintegrating tablet Take 10 mg by mouth See admin instructions. Take one tablet  (10 mg) by mouth daily as needed for migraine. May repeat in 2 hours if still needed     rosuvastatin (CRESTOR) 10 MG tablet Take 1 tablet (10 mg total) by mouth daily. 90 tablet 3   traZODone (DESYREL) 100 MG tablet Take 2 tablets (200 mg total) by mouth at bedtime. 60 tablet 0   No current facility-administered medications on file prior to visit.   Allergies  Allergen Reactions  Carbamazepine Other (See Comments)    Parkinsons like symptoms tremors   Sertraline Hcl Other (See Comments)    Unknown reaction   Family History  Problem Relation Age of Onset   Breast cancer Neg Hx     PE: There were no vitals taken for this visit. Wt Readings from Last 3 Encounters:  02/10/23 201 lb (91.2 kg)  01/11/23 197 lb (89.4 kg)  12/04/22 198 lb 8 oz (90 kg)   Constitutional: overweight, in NAD. No kyphosis. Walks with a cane. Eyes: EOMI, no exophthalmos ENT: no thyromegaly, no nodules palpated, no cervical lymphadenopathy Cardiovascular: RRR, No MRG  Respiratory: CTA B Musculoskeletal: no deformities Skin: no rashes, + bruises on forearms Neurological: + tremor with outstretched hands  Assessment: 1.  Primary hyperparathyroidism  2. Thyroid nodules  Plan: Patient with several instances of elevated calcium in the past, with the highest being 12.8-12.9.  A corresponding intact PTH level was also high, at 72.  She also has a history of vitamin D deficiency, but recent levels have been normal. -She had no osteoporosis, fractures, but apparently had a kidney stone "years ago" -We checked her 24-hour urine calcium and this was normal, with a fractional excretion of calcium of 0.02.  She had extensive evaluation for her elevated calcium in the past including: normal calcitriol level, PTH rp, ACE, SPEP/UPEP magnesium, and phosphorus levels were normal.  Did have a history of CKD but this was mild and not severe enough to explain her significantly elevated calcium.  I referred her to Dr. Gerrit Friends  and she had technetium sestamibi scan (11/23/2020) which did not show a clear parathyroid lesion.  There was the possibility of a left inferior adenoma.  She also had a 4 D CT (12/17/2020) which also showed a possible left inferior parathyroid adenoma measuring 9 mm.  She had left inferior parathyroidectomy on 01/09/2021. Final pathology showed a 0.465 g parathyroid adenoma, measuring 1.2 cm in the largest dimension.  to hypocalcemia. -After the surgery, PTH was still elevated, however, calcium was actually low at the time of the measurements so it was most likely a physiologic, not pathologic response of the parathyroid glands -She is currently on 1000 units vitamin D daily in a MVI -Ionized calcium level was normal at last visit, in 03/2022.  At that time, PTH and vitamin D levels were also normal. -At today's visit, we reviewed her most recent calcium level and this was normal, and 9.3 for an albumin of 3.9 on 02/10/2023. -She does not appear to have persistent primary hyperparathyroidism.  For now, we will continue to follow her with annual calcium and vitamin D levels. -will check vitamin D level today -I will see her back in 1 year  2. Thyroid nodules -Patient with a history of 3 subcentimeter and 1 super centimeter thyroid nodule, which the largest was biopsied with benign results in 2021 -We checked another thyroid ultrasound in 2022 and this showed that only one of the nodules required follow-up.  This was hypoechoic, measuring 1 cm in the largest dimension -No neck compression symptoms -TSH was normal at last check in 03/2022 -Will recheck her thyroid ultrasound now  Component     Latest Ref Rng 04/19/2023  VITD     30.00 - 100.00 ng/mL 60.11   Vitamin D level is normal.  Carlus Pavlov, MD PhD [facility]

## 2023-04-19 NOTE — Patient Instructions (Addendum)
Please continue vitamin D 1000 units daily.    Please stop at the lab.  We will schedule a thyroid ultrasound for you.  Please come back for a follow-up appointment in 1 year.

## 2023-04-19 NOTE — Patient Outreach (Signed)
  Care Coordination   Follow Up Visit Note   04/19/2023 Name: Valerie [name] MRN: [id number] DOB: 15-Apr-1961  Valerie [name] is a 62 y.o. year old female who sees Valerie Broker, MD for primary care. I spoke with  Valerie [name] by phone today.  What matters to the patients health and wellness today?  Patient has stress related to finances, related to buying medications and related to procuring food     Goals Addressed             This Visit's Progress    Patient Stated she has stress related to finances, related to buying medications, and related to procuring food       Interventions:  Spoke with  client via phone today about client needs. Discussed program support with RN, LCSW, Pharmacist Discussed anxiety and stress issues of client. Client is at risk for falls but is not as fearful of falling as she had been Discussed medication procurement.  Client sometimes has some difficulty in paying for her medications Discussed ambulation of client. Client uses a cane daily to help her walk Discussed energy level of client. She said she feels her energy level is normal Discussed her activities at local gym. She said she uses treadmill at local gym and exercise bike at local gym. She said she goes to gym 4 times per week.  She said she feels that this has helped her energy level Discussed relaxation techniques of client. 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 She enjoys watching TV Discussed family support for client. Client has support from her daughter and from her son Discussed recent car repairs for client's car.  She drives her car as needed for transport needs Valerie [name] said she receives on line support from a therapist 1 time weekly Discussed food needs of client. Encouraged client  or Valerie [name] to call LCSW as needed for SW support for client at [phone].           SDOH assessments and interventions completed:  Yes  SDOH Interventions Today     Flowsheet Row Most Recent Value  SDOH Interventions   Depression Interventions/Treatment  Medication, Currently on Treatment  Physical Activity Interventions Other (Comments)  [client uses a cane as needed to help her walk]  Stress Interventions Provide Counseling  [client has stress in managing her medical needs]        Care Coordination Interventions:  Yes, provided   Interventions Today    Flowsheet Row Most Recent Value  Chronic Disease   Chronic disease during today's visit Other  [spoke with client about client needs]  General Interventions   General Interventions Discussed/Reviewed General Interventions Discussed, [employer]  [discussed program support]  Exercise Interventions   Exercise Discussed/Reviewed Physical Activity  [goes to gym 4 times a week]  Education Interventions   Education Provided Provided Education  Provided Verbal Education On [employer]  Mental Health Interventions   Mental Health Discussed/Reviewed Coping Strategies  [client did not mention any mood issues. She did say she speaks with a therapist on line 1 time per week]  Nutrition Interventions   Nutrition Discussed/Reviewed Nutrition Discussed  [decreased appetite]  Pharmacy Interventions   Pharmacy Dicussed/Reviewed Pharmacy Topics Discussed        Follow up plan: Follow up call scheduled for 06/07/23 at 2:30 PM     Encounter Outcome:  Pt. Visit Completed   Valerie [name].Valerie [name] MSW, LCSW Licensed Visual merchandiser [facility] Care Management [phone]

## 2023-04-27 ENCOUNTER — Ambulatory Visit
Admission: RE | Admit: 2023-04-27 | Discharge: 2023-04-27 | Disposition: A | Discharge status: Home / Self Care | Payer: Medicare Other | Source: Ambulatory Visit | Attending: Internal Medicine | Admitting: Internal Medicine

## 2023-04-27 DIAGNOSIS — E042 Nontoxic multinodular goiter: Secondary | ICD-10-CM

## 2023-05-03 ENCOUNTER — Ambulatory Visit (INDEPENDENT_AMBULATORY_CARE_PROVIDER_SITE_OTHER): Payer: Medicare Other | Admitting: Family Medicine

## 2023-05-03 ENCOUNTER — Encounter: Payer: Self-pay | Admitting: Family Medicine

## 2023-05-03 VITALS — BP 102/66 | HR 80 | Temp 98.6°F | Ht 62.0 in | Wt 184.0 lb

## 2023-05-03 DIAGNOSIS — L4050 Arthropathic psoriasis, unspecified: Secondary | ICD-10-CM | POA: Diagnosis not present

## 2023-05-03 DIAGNOSIS — K5903 Drug induced constipation: Secondary | ICD-10-CM | POA: Diagnosis not present

## 2023-05-03 DIAGNOSIS — N1832 Chronic kidney disease, stage 3b: Secondary | ICD-10-CM | POA: Diagnosis not present

## 2023-05-03 DIAGNOSIS — Z23 Encounter for immunization: Secondary | ICD-10-CM

## 2023-05-03 DIAGNOSIS — T402X5A Adverse effect of other opioids, initial encounter: Secondary | ICD-10-CM

## 2023-05-03 DIAGNOSIS — B372 Candidiasis of skin and nail: Secondary | ICD-10-CM

## 2023-05-03 LAB — COMPREHENSIVE METABOLIC PANEL
ALT: 17 U/L (ref 0–35)
AST: 16 U/L (ref 0–37)
Albumin: 3.8 g/dL (ref 3.5–5.2)
Alkaline Phosphatase: 75 U/L (ref 39–117)
BUN: 37 mg/dL — ABNORMAL HIGH (ref 6–23)
CO2: 31 meq/L (ref 19–32)
Calcium: 9.4 mg/dL (ref 8.4–10.5)
Chloride: 103 meq/L (ref 96–112)
Creatinine, Ser: 1.45 mg/dL — ABNORMAL HIGH (ref 0.40–1.20)
GFR: 38.41 mL/min — ABNORMAL LOW (ref >60.00)
Glucose, Bld: 78 mg/dL (ref 70–99)
Potassium: 3.8 meq/L (ref 3.5–5.1)
Sodium: 139 meq/L (ref 135–145)
Total Bilirubin: 0.3 mg/dL (ref 0.2–1.2)
Total Protein: 6.4 g/dL (ref 6.0–8.3)

## 2023-05-03 MED ORDER — NITROGLYCERIN 0.4 MG SL SUBL: 0.4000 mg | SUBLINGUAL_TABLET | SUBLINGUAL | 2 refills | Status: DC | PRN

## 2023-05-03 MED ORDER — FLUCONAZOLE 50 MG PO TABS
50.0000 mg | ORAL_TABLET | Freq: Every day | ORAL | 0 refills | Status: AC
Start: 2023-05-03 — End: 2023-05-31

## 2023-05-03 MED ORDER — LEVOCETIRIZINE DIHYDROCHLORIDE 5 MG PO TABS: 5.0000 mg | ORAL_TABLET | Freq: Every evening | ORAL | 1 refills | Status: DC

## 2023-05-03 MED ORDER — LUBIPROSTONE 8 MCG PO CAPS
8.0000 ug | ORAL_CAPSULE | Freq: Two times a day (BID) | ORAL | 2 refills | Status: DC
Start: 2023-05-03 — End: 2023-07-23

## 2023-05-03 MED ORDER — OXYBUTYNIN CHLORIDE 5 MG PO TABS: 5.0000 mg | ORAL_TABLET | Freq: Two times a day (BID) | ORAL | 1 refills | Status: DC

## 2023-05-03 NOTE — Progress Notes (Unsigned)
Assessment & Plan:  1. Psoriatic arthritis (HCC) I will have our referral team reach out to the rheumatology office to see if the patient can get in sooner than November.  2. Constipation due to opioid therapy Uncontrolled.  Starting Amitiza. - lubiprostone (AMITIZA) 8 MCG capsule; Take 1 capsule (8 mcg total) by mouth 2 (two) times daily with a meal.  Dispense: 60 capsule; Refill: 2  3. Stage 3b chronic kidney disease (HCC) Labs to assess this patient has been taking NSAIDs for pain relief. - Comprehensive metabolic panel  4. Candidal intertrigo - fluconazole (DIFLUCAN) 50 MG tablet; Take 1 tablet (50 mg total) by mouth daily for 28 days.  Dispense: 28 tablet; Refill: 0  5. Encounter for immunization - Flu vaccine trivalent PF, 6mos and older(Flulaval,Afluria,Fluarix,Fluzone)   Follow up plan: Return if symptoms worsen or fail to improve.  Valerie Boston, MSN, APRN, FNP-C  Subjective:  HPI: Valerie [name] is a 62 y.o. female presenting on 05/03/2023 for Psoriasis (Flare up - Sees Dr Dimple Casey in [place]. Eulah Citizen like something to help in the mean time )  Patient is here due to a psoriasis flareup and a rash in her groin folds.  Patient reports her pointer finger on her right hand started having a psoriasis flareup three weeks ago.  She is now having difficulty picking things up and holding onto objects.  She reports generalized joint pains.  She was on Enbrel in the past.  She is currently prescribed prednisone 5 mg once daily and states she has increased herself to 20 mg daily.  She has also been taking NSAIDs to treat her pain despite known chronic kidney disease.  She has a prescription for hydrocodone for breakthrough pain which she does not take due to constipation.  She was previously prescribed Linzess that caused uncontrollable diarrhea.  She currently takes stool softeners, drinks lots of water, and make sure she gets enough fiber to keep her bowels moving without the pain medication.   She is scheduled to see rheumatology, but not until November.  The rash in her groin folds has been present for several weeks.  She states typically she can take a Diflucan and it resolves.  She has been taking Diflucan every 3 days and not had success resolving the rash.  She has previously tried prescription creams and powders.    ROS: Negative unless specifically indicated above in HPI.   Relevant past medical history reviewed and updated as indicated.   Allergies and medications reviewed and updated.   Current Outpatient Medications:    ALPRAZolam (XANAX) 0.5 MG tablet, Take 1/2 to 1 tablet TID prn, Disp: , Rfl:    carbidopa-levodopa (SINEMET IR) 25-100 MG tablet, Take by mouth., Disp: , Rfl:    docusate sodium (COLACE) 100 MG capsule, Take 200 mg by mouth at bedtime., Disp: , Rfl:    donepezil (ARICEPT) 10 MG tablet, Take 10 mg by mouth daily., Disp: , Rfl:    FLUoxetine (PROZAC) 40 MG capsule, Take 40 mg by mouth 2 (two) times daily., Disp: , Rfl:    HYDROcodone-acetaminophen (NORCO) 10-325 MG tablet, Take 0.5-1 tablets by mouth every 6 (six) hours as needed for moderate pain or severe pain., Disp: 20 tablet, Rfl: 0   lamoTRIgine (LAMICTAL) 150 MG tablet, Take 1 tablet by mouth 2 (two) times daily., Disp: , Rfl:    levETIRAcetam (KEPPRA) 750 MG tablet, Take 750 mg by mouth 2 (two) times daily., Disp: , Rfl:    levocetirizine (XYZAL) 5  MG tablet, Take 5 mg by mouth every evening., Disp: , Rfl:    meclizine (ANTIVERT) 25 MG tablet, Take 1 tablet (25 mg total) by mouth 3 (three) times daily as needed for dizziness., Disp: 30 tablet, Rfl: 2   memantine (NAMENDA) 10 MG tablet, TAKE 1 TABLET BY MOUTH EVERY DAY FOR 7 DAYS THEN TAKE 1 TABLET BY MOUTH TWICE DAILY THEREAFTER, Disp: , Rfl:    omeprazole (PRILOSEC) 40 MG capsule, Take 1 capsule (40 mg total) by mouth 2 (two) times daily before a meal., Disp: 180 capsule, Rfl: 0   oxybutynin (DITROPAN) 5 MG tablet, Take 1 tablet (5 mg total) by  mouth 2 (two) times daily., Disp: 90 tablet, Rfl: 3   predniSONE (DELTASONE) 5 MG tablet, Take 1 tablet (5 mg total) by mouth daily with breakfast., Disp: 90 tablet, Rfl: 1   primidone (MYSOLINE) 50 MG tablet, Take by mouth., Disp: , Rfl:    rizatriptan (MAXALT-MLT) 10 MG disintegrating tablet, Take 10 mg by mouth See admin instructions. Take one tablet (10 mg) by mouth daily as needed for migraine. May repeat in 2 hours if still needed, Disp: , Rfl:    rosuvastatin (CRESTOR) 10 MG tablet, Take 1 tablet (10 mg total) by mouth daily., Disp: 90 tablet, Rfl: 3   traZODone (DESYREL) 100 MG tablet, Take 2 tablets (200 mg total) by mouth at bedtime., Disp: 60 tablet, Rfl: 0   nitroGLYCERIN (NITROSTAT) 0.4 MG SL tablet, Place 1 tablet (0.4 mg total) under the tongue every 5 (five) minutes as needed for chest pain. (Patient not taking: Reported on 05/03/2023), Disp: 30 tablet, Rfl: 2   Probiotic Product (PROBIOTIC DAILY PO), Take 1 tablet by mouth daily. (Patient not taking: Reported on 05/03/2023), Disp: , Rfl:   Allergies  Allergen Reactions   Carbamazepine Other (See Comments)    Parkinsons like symptoms tremors   Sertraline Hcl Other (See Comments)    Unknown reaction    Objective:   BP 102/66   Pulse 80   Temp 98.6 F (37 C)   Ht 5\' 2"  (1.575 m)   Wt 184 lb (83.5 kg)   BMI 33.65 kg/m    Physical Exam Vitals reviewed.  Constitutional:      General: She is not in acute distress.    Appearance: Normal appearance. She is not ill-appearing, toxic-appearing or diaphoretic.  HENT:     Head: Normocephalic and atraumatic.  Eyes:     General: No scleral icterus.       Right eye: No discharge.        Left eye: No discharge.     Conjunctiva/sclera: Conjunctivae normal.  Cardiovascular:     Rate and Rhythm: Normal rate.  Pulmonary:     Effort: Pulmonary effort is normal. No respiratory distress.  Musculoskeletal:        General: Normal range of motion.     Cervical back: Normal range of  motion.  Skin:    General: Skin is warm and dry.     Capillary Refill: Capillary refill takes less than 2 seconds.     Findings: Erythema (bilateral groin folds and under abdomen consistent with Candida) and rash present. Rash is scaling (right index finger).  Neurological:     General: No focal deficit present.     Mental Status: She is alert and oriented to person, place, and time. Mental status is at baseline.  Psychiatric:        Mood and Affect: Mood normal.  Behavior: Behavior normal.        Thought Content: Thought content normal.        Judgment: Judgment normal.

## 2023-05-04 ENCOUNTER — Encounter: Payer: Self-pay | Admitting: Family Medicine

## 2023-05-06 DIAGNOSIS — M7918 Myalgia, other site: Secondary | ICD-10-CM | POA: Diagnosis not present

## 2023-05-06 DIAGNOSIS — Z8673 Personal history of transient ischemic attack (TIA), and cerebral infarction without residual deficits: Secondary | ICD-10-CM | POA: Diagnosis not present

## 2023-05-12 ENCOUNTER — Ambulatory Visit: Payer: Medicare Other

## 2023-05-12 ENCOUNTER — Ambulatory Visit: Payer: Medicare Other | Attending: Internal Medicine | Admitting: Internal Medicine

## 2023-05-12 ENCOUNTER — Ambulatory Visit (INDEPENDENT_AMBULATORY_CARE_PROVIDER_SITE_OTHER): Payer: Medicare Other

## 2023-05-12 ENCOUNTER — Encounter: Payer: Self-pay | Admitting: Internal Medicine

## 2023-05-12 VITALS — BP 105/72 | HR 76 | Resp 16 | Ht 61.75 in | Wt 184.0 lb

## 2023-05-12 DIAGNOSIS — Z79899 Other long term (current) drug therapy: Secondary | ICD-10-CM

## 2023-05-12 DIAGNOSIS — L4050 Arthropathic psoriasis, unspecified: Secondary | ICD-10-CM | POA: Diagnosis not present

## 2023-05-12 DIAGNOSIS — M79641 Pain in right hand: Secondary | ICD-10-CM

## 2023-05-12 DIAGNOSIS — M25561 Pain in right knee: Secondary | ICD-10-CM

## 2023-05-12 DIAGNOSIS — E213 Hyperparathyroidism, unspecified: Secondary | ICD-10-CM

## 2023-05-12 DIAGNOSIS — M79642 Pain in left hand: Secondary | ICD-10-CM | POA: Diagnosis not present

## 2023-05-12 DIAGNOSIS — M25562 Pain in left knee: Secondary | ICD-10-CM

## 2023-05-12 DIAGNOSIS — E559 Vitamin D deficiency, unspecified: Secondary | ICD-10-CM | POA: Insufficient documentation

## 2023-05-12 DIAGNOSIS — G629 Polyneuropathy, unspecified: Secondary | ICD-10-CM

## 2023-05-12 NOTE — Progress Notes (Signed)
Office Visit Note  [name]             Date of Birth: Dec 30, 1960           MRN: [id number]             PCP: Myrlene Broker, MD Referring: Myrlene Broker, * Visit Date: 05/12/2023  Subjective:  New Patient (Initial Visit) (Having a flare, rash on scalp and right hand 2nd digit.)   History of Present Illness: Valerie [name] is a 62 y.o. female here for evaluation and treatment for psoriatic arthritis.  Previous diagnosis and treatment with her internist in [place] with previous treatments including hydroxychloroquine, methotrexate, Enbrel, and long-term prednisone.  She states originally evaluated with reportedly borderline labs for RA check due to hand swelling but was ultimately diagnosed with psoriatic disease.  She does not recall exact sequence of treatments but remembers drugs were stopped after sepsis event in 2020 that required long-term antibiotics and stopping her DMARD treatments.  She has had bilateral total hip arthroplasty.  Currently treated with prednisone 5 mg daily she has been on higher doses up to 20 mg daily in the past. But recently has issues with persistent pain more around the shoulders hips and knees affecting both sides.  Intermittently sees swelling in the hands and feet.  On the scalp and occurring on the right index finger.  Not on any specific topical medication prescriptions for this.   Labs reviewed 04/2023 eGFR 38.41 03/2023 Vit D 60.11  Activities of Daily Living:  Patient reports morning stiffness for 2 hours.   Patient Reports nocturnal pain.  Difficulty dressing/grooming: Denies Difficulty climbing stairs: Reports Difficulty getting out of chair: Reports Difficulty using hands for taps, buttons, cutlery, and/or writing: Reports  Review of Systems  Constitutional:  Positive for fatigue.  HENT:  Positive for mouth dryness. Negative for mouth sores.   Eyes:  Positive for dryness.  Respiratory:  Negative for shortness  of breath.   Cardiovascular:  Negative for chest pain and palpitations.  Gastrointestinal:  Positive for constipation and diarrhea. Negative for blood in stool.  Endocrine: Negative for increased urination.  Genitourinary:  Negative for involuntary urination.  Musculoskeletal:  Positive for joint pain, gait problem, joint pain, joint swelling, myalgias, muscle weakness, morning stiffness, muscle tenderness and myalgias.  Skin:  Negative for color change, rash, hair loss and sensitivity to sunlight.  Allergic/Immunologic: Negative for susceptible to infections.  Neurological:  Positive for headaches. Negative for dizziness.  Hematological:  Negative for swollen glands.  Psychiatric/Behavioral:  Positive for depressed mood. Negative for sleep disturbance. The patient is nervous/anxious.     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   Anemia, chronic disease 07/18/2020   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 07/18/2020   Generalized weakness 06/04/2020   CAD (coronary artery disease) 06/04/2020   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 05/20/2020   DM type 2 (diabetes mellitus, type 2) (HCC) 11/14/2019  Anxiety    Morbid obesity (HCC)    CKD (chronic kidney disease), stage III (HCC)    Psoriatic arthritis (HCC)    Tobacco abuse    OSA (obstructive sleep apnea)    Neuropathy    Bipolar  disorder (HCC)    Osteoarthritis of right hip 11/05/2014   Essential hypertension 10/29/2014    Past Medical History:  Diagnosis Date   Anemia    Anxiety    Arthritis    Psoriatic   Bipolar disorder (HCC)    CKD (chronic kidney disease), stage III (HCC)    Colovesical fistula    Depression    DM type 2 (diabetes mellitus, type 2) (HCC) 11/14/2019   Dyspnea    Headache    Neuropathy    NSTEMI (non-ST elevated myocardial infarction) (HCC) 11/14/2019   OSA (obstructive sleep apnea)    Psoriatic arthritis (HCC)    Sleep apnea    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   Stroke (HCC)    ?history; MRI normal    Family History  Problem Relation Age of Onset   Breast cancer Neg Hx    Past Surgical History:  Procedure Laterality Date   ABDOMINAL HYSTERECTOMY     APPENDECTOMY     CHOLECYSTECTOMY     CYSTOSCOPY WITH STENT PLACEMENT N/A 07/17/2020   Procedure: CYSTOSCOPY WITH BILATERAL FIREFLY INJECTION;  Surgeon: Heloise Purpura, MD;  Location: [facility];  Service: Urology;  Laterality: N/A;   FLEXIBLE SIGMOIDOSCOPY N/A 05/17/2020   Procedure: FLEXIBLE SIGMOIDOSCOPY;  Surgeon: Jeani Hawking, MD;  Location: [facility];  Service: Endoscopy;  Laterality: N/A;   FOOT SURGERY Right    IR FLUORO GUIDE CV LINE RIGHT  08/19/2020   IR RADIOLOGIST EVAL & MGMT  09/10/2020   IR RADIOLOGIST EVAL & MGMT  10/10/2020   IR REMOVAL TUN CV CATH W/O FL  09/16/2020   IR SINUS/FIST TUBE CHK-NON GI  10/24/2020   IR SINUS/FIST TUBE CHK-NON GI  10/31/2020   IR US GUIDE VASC ACCESS RIGHT  08/19/2020   LEFT HEART CATH AND CORONARY ANGIOGRAPHY N/A 11/14/2019   Procedure: LEFT HEART CATH AND CORONARY ANGIOGRAPHY;  Surgeon: [place]az[place], Peter M, MD;  Location: [facility];  Service: Cardiovascular;  Laterality: N/A;   PARATHYROID EXPLORATION N/A 01/09/2021   Procedure: POSSIBLE NECK EXPLORATION;  Surgeon: Darnell Level, MD;  Location: [facility];  Service: General;  Laterality: N/A;   PARATHYROIDECTOMY Left 01/09/2021    Procedure: LEFT INFERIOR PARATHYROIDECTOMY;  Surgeon: Darnell Level, MD;  Location: [facility];  Service: General;  Laterality: Left;   PROCTOSCOPY N/A 07/17/2020   Procedure: RIGID PROCTOSCOPY;  Surgeon: Karie Soda, MD;  Location: [facility];  Service: General;  Laterality: N/A;   TONSILLECTOMY     TOTAL HIP ARTHROPLASTY Bilateral    Social History   Social History Narrative   Not on file   Immunization History  Administered Date(s) Administered   Influenza, Seasonal, Injecte, Preservative Fre 05/03/2023   Influenza,inj,Quad PF,6+ Mos 06/30/2017, 08/12/2022     Objective: Vital Signs: BP 105/72 (BP Location: Left Arm, Patient Position: Sitting, Cuff Size: Normal)   Pulse 76   Resp 16   Ht 5' 1.75" (1.568 m)   Wt 184 lb (83.5 kg)   BMI 33.93 kg/m    Physical Exam Eyes:     Conjunctiva/sclera: Conjunctivae normal.  Cardiovascular:     Rate and Rhythm: Normal rate and regular rhythm.  Pulmonary:     Effort: Pulmonary effort is normal.  Breath sounds: Normal breath sounds.  Lymphadenopathy:     Cervical: No cervical adenopathy.  Skin:    General: Skin is warm and dry.     Findings: Rash present.     Comments: Rash with mild scaling on scalp left of midline, no erythema Right 2nd finger scaling on flexor side over middle phalanx, no palmar rash Extensive bruising on dorsum of both hands and forearms Hyperpigmentation and skin tightening circumferentially at distal shins, varicose veins, no pitting edema Faintly violaceous toe discoloration, normal refill  Neurological:     Mental Status: She is alert.  Psychiatric:        Mood and Affect: Mood normal.      Musculoskeletal Exam:  Shoulders full ROM Elbows full ROM no tenderness or swelling Wrists full ROM no tenderness or swelling Fingers full ROM bony nodules and slight deviation right 2nd DIP, no palpable swelling Lateral hip tenderness to pressure on both sides Knees full ROM, medial joint line tenderness to  pressure and pain with full flexion and extension, no palpable effusions Ankles full ROM no tenderness or swelling MTPs full ROM no tenderness or swelling   Investigation: No additional findings.  Imaging: XR KNEE 3 VIEW LEFT  Result Date: 05/12/2023 X-ray left knee 3 views There is mild medial compartment joint space narrowing and small marginal osteophyte on tibia.  Patellofemoral joint space appears preserved small lateral and superior osteophyte.  No abnormal calcification erosion or visible joint effusion. Impression Mild medial and patellofemoral compartment osteoarthritis  XR KNEE 3 VIEW RIGHT  Result Date: 05/12/2023 X-ray right knee 3 views There is severe almost complete joint space narrowing in the medial compartment.  Lateral osteophyte process on the tibia.  Lateral shift of patella position and large medial osteophyte.  Also with posterior bone spurring.  No abnormal calcifications erosions or visible joint effusion. Impression Severe medial compartment osteoarthritis  XR Hand 2 View Right  Result Date: 05/12/2023 X-ray right hand 2 views Radiocarpal joint appears normal.  Carpal bones appear normal there is very mild first [facility] joint degenerative changes.  MCP and PIP joints appear normal.  Very small lateral osteophyte at second third DIPs.  No erosions abnormal calcifications seen.  Bone mineralization may be consistent with generalized osteopenia. Impression Very mild appearing osteoarthritis of first CMC joint and DIP joints  XR Hand 2 View Left  Result Date: 05/12/2023 X-ray left hand 2 views Radiocarpal joint space appears normal.  Carpal bones appear normal except significant degenerative arthritis of the first [facility] joint with moderately severe subluxation.  MCP PIP and DIP joints appear normal.  No erosions or abnormal calcifications seen.  Bone mineralization may be consistent with generalized osteopenia. Impression Advanced 1st CMC joint osteoarthritis otherwise  unremarkable  US THYROID  Result Date: 04/28/2023 CLINICAL DATA:  63 year old female with thyroid nodules EXAM: THYROID ULTRASOUND TECHNIQUE: Ultrasound examination of the thyroid gland and adjacent soft tissues was performed. COMPARISON:  04/17/2021 FINDINGS: Parenchymal Echotexture: Moderately heterogenous Isthmus: 0.5 cm Right lobe: 3.7 cm x 1.0 cm x 1.4 cm Left lobe: 3.9 cm x 1.0 cm x 1.4 cm _________________________________________________________ Estimated total number of nodules >/= 1 cm: 1 Number of spongiform nodules >/=  2 cm not described below (TR1): 0 Number of mixed cystic and solid nodules >/= 1.5 cm not described below (TR2): 0 _________________________________________________________ Nodule labeled 1 in the mid right thyroid, 7 mm with spongiform characteristics. Nodule does not meet criteria for surveillance. Nodule labeled 2 in the mid left thyroid, 1.2  cm. Nodule has TR 2/cystic characteristics and does not meet criteria for surveillance. Nodule labeled 3, inferior left thyroid, 8 mm. Nodule has decreased in size, TR 4, and does not meet the threshold for continued surveillance. Discontinuing surveillance may be reasonable. No adenopathy IMPRESSION: Heterogeneous thyroid suggesting medical thyroid disease. Nodule labeled 3 inferior left thyroid, TR 4, has decreased in size and is now below threshold for surveillance. Discontinuing surveillance may be reasonable. Recommendations follow those established by the new ACR TI-RADS criteria (J Am Coll Radiol 2017;14:587-595). Electronically Signed   By: Gilmer Mor D.O.   On: 04/28/2023 14:26    Recent Labs: Lab Results  Component Value Date   WBC 9.7 05/12/2023   HGB 13.6 05/12/2023   PLT 257 05/12/2023   NA 139 05/03/2023   K 3.8 05/03/2023   CL 103 05/03/2023   CO2 31 05/03/2023   GLUCOSE 78 05/03/2023   BUN 37 (H) 05/03/2023   CREATININE 1.45 (H) 05/03/2023   BILITOT 0.3 05/03/2023   ALKPHOS 75 05/03/2023   AST 16 05/03/2023    ALT 17 05/03/2023   PROT 6.4 05/03/2023   ALBUMIN 3.8 05/03/2023   CALCIUM 9.4 05/03/2023   GFRAA 38 (L) 05/15/2020   QFTBGOLDPLUS NEGATIVE 05/12/2023    Speciality Comments: No specialty comments available.  Procedures:  No procedures performed Allergies: Carbamazepine and Sertraline hcl   Assessment / Plan:     Visit Diagnoses: Psoriatic arthritis (HCC) - Plan: XR Hand 2 View Right, XR Hand 2 View Left, XR KNEE 3 VIEW RIGHT, XR KNEE 3 VIEW LEFT, Sedimentation rate, C-reactive protein  Joint pain prolonged morning stiffness and intermittent peripheral joint swelling in multiple areas.  Checking sed rate and CRP for disease activity assessment.  Currently remains on low-dose prednisone 5 mg daily.  X-ray of both hands shows degenerative changes worst is the base of left thumb otherwise mild.  Bilateral knee x-ray shows advanced medial compartment osteoarthritis of the right knee left knee is mild.  High risk medication use - Plan: IgG, IgA, IgM, QuantiFERON-TB Gold Plus, CBC with Differential/Platelet, Hepatitis B core antibody, IgM, Hepatitis B surface antigen, Hepatitis C antibody  We checking baseline labs today in anticipation of possibly resuming DMARD treatment.  Including blood count, hepatitis and TB screening, and quantitative immunoglobulins.  Chronic kidney disease stage III by most recent labs so not a good candidate for NSAIDs and would not be a good candidate for methotrexate. Also discussed need for bone density screening in the setting of chronic low-dose glucocorticoid treatment increasing her risk of osteoporosis.  Is on supplemental vitamin D with most recent levels very good.  Will order for bone density test.  Orders: Orders Placed This Encounter  Procedures   XR Hand 2 View Right   XR Hand 2 View Left   XR KNEE 3 VIEW RIGHT   XR KNEE 3 VIEW LEFT   Sedimentation rate   C-reactive protein   IgG, IgA, IgM   QuantiFERON-TB Gold Plus   CBC with  Differential/Platelet   Hepatitis B core antibody, IgM   Hepatitis B surface antigen   Hepatitis C antibody   No orders of the defined types were placed in this encounter.    Follow-Up Instructions: Return in about 3 weeks (around 06/02/2023) for New pt PsA on GC f/u 3wks.   Fuller Plan, MD  Note - This record has been created using [employer].  Chart creation errors have been sought, but may not always  have been  located. Such creation errors do not reflect on  the standard of medical care.

## 2023-05-13 ENCOUNTER — Telehealth: Payer: Self-pay

## 2023-05-13 NOTE — Telephone Encounter (Signed)
[facility] and they are unable to schedule one until April 2025. Patient wanted to know if there is another location she could go. Patient states she has never had a bone density scan before. I advised patient we could order at [facility] location if okay with Dr. Dimple Casey. Advised patient that I would send a message to ask and we could call her to advise.   Okay to place DEXA order for [facility]?

## 2023-05-14 ENCOUNTER — Other Ambulatory Visit: Payer: Self-pay | Admitting: *Deleted

## 2023-05-14 DIAGNOSIS — E559 Vitamin D deficiency, unspecified: Secondary | ICD-10-CM

## 2023-05-14 DIAGNOSIS — Z13820 Encounter for screening for osteoporosis: Secondary | ICD-10-CM

## 2023-05-14 NOTE — Telephone Encounter (Signed)
I agree with getting baseline tax at [facility].

## 2023-05-14 NOTE — Telephone Encounter (Signed)
I called patient, patient will call [facility] to schedule DEXA.

## 2023-05-15 LAB — CBC WITH DIFFERENTIAL/PLATELET
Absolute Monocytes: 776 cells/uL (ref 200–950)
Basophils Absolute: 58 cells/uL (ref 0–200)
Basophils Relative: 0.6 %
Eosinophils Absolute: 243 cells/uL (ref 15–500)
Eosinophils Relative: 2.5 %
HCT: 41.7 % (ref 35.0–45.0)
Hemoglobin: 13.6 g/dL (ref 11.7–15.5)
Lymphs Abs: 1368 cells/uL (ref 850–3900)
MCH: 31.9 pg (ref 27.0–33.0)
MCHC: 32.6 g/dL (ref 32.0–36.0)
MCV: 97.9 fL (ref 80.0–100.0)
MPV: 9.8 fL (ref 7.5–12.5)
Monocytes Relative: 8 %
Neutro Abs: 7256 cells/uL (ref 1500–7800)
Neutrophils Relative %: 74.8 %
Platelets: 257 10*3/uL (ref 140–400)
RBC: 4.26 10*6/uL (ref 3.80–5.10)
RDW: 13.4 % (ref 11.0–15.0)
Total Lymphocyte: 14.1 %
WBC: 9.7 10*3/uL (ref 3.8–10.8)

## 2023-05-15 LAB — QUANTIFERON-TB GOLD PLUS
Mitogen-NIL: 3.6 IU/mL
NIL: 0.02 IU/mL
QuantiFERON-TB Gold Plus: NEGATIVE
TB1-NIL: 0 IU/mL
TB2-NIL: 0 IU/mL

## 2023-05-15 LAB — HEPATITIS C ANTIBODY: Hepatitis C Ab: NONREACTIVE

## 2023-05-15 LAB — SEDIMENTATION RATE: Sed Rate: 22 mm/h (ref 0–30)

## 2023-05-15 LAB — IGG, IGA, IGM
IgG (Immunoglobin G), Serum: 581 mg/dL — ABNORMAL LOW (ref 600–1540)
IgM, Serum: 60 mg/dL (ref 50–300)
Immunoglobulin A: 76 mg/dL (ref 70–320)

## 2023-05-15 LAB — HEPATITIS B SURFACE ANTIGEN: Hepatitis B Surface Ag: NONREACTIVE

## 2023-05-15 LAB — C-REACTIVE PROTEIN: CRP: 8.7 mg/L — ABNORMAL HIGH (ref <8.0)

## 2023-05-15 LAB — HEPATITIS B CORE ANTIBODY, IGM: Hep B C IgM: NONREACTIVE

## 2023-05-24 ENCOUNTER — Ambulatory Visit (HOSPITAL_BASED_OUTPATIENT_CLINIC_OR_DEPARTMENT_OTHER)
Admission: RE | Admit: 2023-05-24 | Discharge: 2023-05-24 | Disposition: A | Discharge status: Home / Self Care | Payer: Medicare Other | Source: Ambulatory Visit | Attending: Internal Medicine | Admitting: Internal Medicine

## 2023-05-24 DIAGNOSIS — Z96643 Presence of artificial hip joint, bilateral: Secondary | ICD-10-CM | POA: Diagnosis not present

## 2023-05-24 DIAGNOSIS — Z780 Asymptomatic menopausal state: Secondary | ICD-10-CM | POA: Insufficient documentation

## 2023-05-24 DIAGNOSIS — F17200 Nicotine dependence, unspecified, uncomplicated: Secondary | ICD-10-CM | POA: Diagnosis not present

## 2023-05-24 DIAGNOSIS — E559 Vitamin D deficiency, unspecified: Secondary | ICD-10-CM | POA: Diagnosis not present

## 2023-05-24 DIAGNOSIS — M85832 Other specified disorders of bone density and structure, left forearm: Secondary | ICD-10-CM | POA: Diagnosis not present

## 2023-05-24 DIAGNOSIS — Z13820 Encounter for screening for osteoporosis: Secondary | ICD-10-CM | POA: Insufficient documentation

## 2023-05-27 ENCOUNTER — Ambulatory Visit: Payer: Medicare Other

## 2023-05-27 ENCOUNTER — Ambulatory Visit
Admission: RE | Admit: 2023-05-27 | Discharge: 2023-05-27 | Disposition: A | Discharge status: Home / Self Care | Payer: Medicare Other | Source: Ambulatory Visit | Attending: Internal Medicine | Admitting: Internal Medicine

## 2023-05-27 DIAGNOSIS — Z1231 Encounter for screening mammogram for malignant neoplasm of breast: Secondary | ICD-10-CM | POA: Diagnosis not present

## 2023-06-01 ENCOUNTER — Encounter: Payer: Self-pay | Admitting: Internal Medicine

## 2023-06-01 ENCOUNTER — Ambulatory Visit: Payer: Medicare Other | Attending: Internal Medicine | Admitting: Internal Medicine

## 2023-06-01 VITALS — BP 107/71 | HR 64 | Resp 14 | Ht 62.0 in | Wt 187.0 lb

## 2023-06-01 DIAGNOSIS — Z7952 Long term (current) use of systemic steroids: Secondary | ICD-10-CM

## 2023-06-01 DIAGNOSIS — L4050 Arthropathic psoriasis, unspecified: Secondary | ICD-10-CM | POA: Diagnosis not present

## 2023-06-01 DIAGNOSIS — M150 Primary generalized (osteo)arthritis: Secondary | ICD-10-CM | POA: Diagnosis not present

## 2023-06-01 LAB — HM MAMMOGRAPHY

## 2023-06-01 NOTE — Progress Notes (Signed)
Wt 187 lb (84.8 kg)   BMI 34.20 kg/m    Physical Exam Constitutional:      Appearance: She is obese.  Eyes:     Conjunctiva/sclera: Conjunctivae normal.  Cardiovascular:     Rate and Rhythm: Normal rate and regular rhythm.  Pulmonary:     Effort: Pulmonary effort is normal.     Breath sounds: Normal breath sounds.  Lymphadenopathy:     Cervical: No cervical adenopathy.  Skin:    General: Skin is warm and dry.     Findings: Rash present.     Comments: Mild scaling on scalp, left of midline Right 2nd finger scaling on flexor side over middle phalanx, no palmar rash Extensive bruising on dorsum of both hands and forearms Hyperpigmentation and skin tightening circumferentially at distal shins, varicose veins, no pitting edema  Neurological:     Mental Status: She is alert.  Psychiatric:        Mood and Affect: Mood normal.      Musculoskeletal Exam:  Elbows full ROM no tenderness or swelling Wrists full ROM no tenderness or  swelling Fingers full ROM bony nodules and slight deviation right 2nd DIP, left thumb 1st CMC tenderness, no palpable swelling Lateral hip tenderness to pressure on both sides Knees full ROM, medial joint line tenderness to pressure and pain with full flexion and extension, no palpable effusions Ankles full ROM no tenderness or swelling    Investigation: No additional findings.  Imaging: MM 3D SCREENING MAMMOGRAM BILATERAL BREAST  Result Date: 05/31/2023 CLINICAL DATA:  Screening. EXAM: DIGITAL SCREENING BILATERAL MAMMOGRAM WITH TOMOSYNTHESIS AND CAD TECHNIQUE: Bilateral screening digital craniocaudal and mediolateral oblique mammograms were obtained. Bilateral screening digital breast tomosynthesis was performed. The images were evaluated with computer-aided detection. COMPARISON:  Previous exam(s). ACR Breast Density Category b: There are scattered areas of fibroglandular density. FINDINGS: There are no findings suspicious for malignancy. IMPRESSION: No mammographic evidence of malignancy. A result letter of this screening mammogram will be mailed directly to the patient. RECOMMENDATION: Screening mammogram in one year. (Code:SM-B-01Y) BI-RADS CATEGORY  1: Negative. Electronically Signed   By: Harmon Pier M.D.   On: 05/31/2023 17:41   DG BONE DENSITY (DXA)  Result Date: 05/24/2023 EXAM: DUAL X-RAY ABSORPTIOMETRY (DXA) FOR BONE MINERAL DENSITY IMPRESSION: Referring Physician:  Jamesetta Orleans [name] Your patient completed a bone mineral density test using GE Lunar iDXA system (analysis version: 16). Technologist: ALW PATIENT: Name: Valerie [name], Valerie [name] Patient ID: [id number] Birth Date: April 08, 1961 Height: 61.7 in. Sex: Female Measured: 05/24/2023 Weight: 184.0 lbs. Indications: Bilateral Hip Replacement, Caucasian, History of Fracture (Adult), Hysterectomy, Keppra, Omeprazole, Prednisone, Tobacco User (Current Smoker) Fractures: Clavicle, Forearm Treatments: Multivitamin ASSESSMENT: The BMD measured at Left  Forearm Radius 33% is 0.730 g/cm2 with a T-score of -1.7. This patient is considered to have osteopenia/low bone mass according to World Health Organization [facility]) criteria. The scan quality is good. Bilateral hips excluded due to surgical hardware. No FRAX due to no hip images. Site Region Measured Date Measured Age YA BMD Significant CHANGE T-score Left Forearm Radius 33% 05/24/2023 63.4 -1.7 0.730 g/cm2 AP Spine L1-L4 05/24/2023 63.4 -1.6 0.997 g/cm2 World Health Organization [facility]) criteria for post-menopausal, Caucasian Women: Normal       T-score at or above -1 SD Osteopenia   T-score between -1 and -2.5 SD Osteoporosis T-score at or below -2.5 SD RECOMMENDATION: 1. All patients should optimize calcium and vitamin D intake. 2. Consider FDA approved medical therapies in postmenopausal women and men aged 36  Office Visit Note  [name]             Date of Birth: 1961/07/15           MRN: [id number]             PCP: Myrlene Broker, MD Referring: Myrlene Broker, * Visit Date: 06/01/2023   Subjective:  Follow-up (Patient states she feels really bad. )   History of Present Illness: Valerie [name] is a 62 y.o. female here for follow up with joint pain in multiple areas concern for psoriatic arthritis.  Evaluation at our initial visit was overall unremarkable CRP just slightly outside normal at 8.7 x-rays without any erosive disease.  She continues to have joint pain in multiple areas including her back and legs and hands.  Bone density scan from last month is consistent with osteopenia with T-score -1.7.  Previous HPI 05/12/23 Valerie [name] is a 62 y.o. female here for evaluation and treatment for psoriatic arthritis.  Previous diagnosis and treatment with her internist in [place] with previous treatments including hydroxychloroquine, methotrexate, Enbrel, and long-term prednisone.  She states originally evaluated with reportedly borderline labs for RA check due to hand swelling but was ultimately diagnosed with psoriatic disease.  She does not recall exact sequence of treatments but remembers drugs were stopped after sepsis event in 2020 that required long-term antibiotics and stopping her DMARD treatments.  She has had bilateral total hip arthroplasty.  Currently treated with prednisone 5 mg daily she has been on higher doses up to 20 mg daily in the past. But recently has issues with persistent pain more around the shoulders hips and knees affecting both sides.  Intermittently sees swelling in the hands and feet.  On the scalp and occurring on the right index finger.  Not on any specific topical medication prescriptions for this.     Labs reviewed 04/2023 eGFR 38.41 03/2023 Vit D 60.11   Review of Systems  Constitutional:  Positive for fatigue.  HENT:  Positive  for mouth dryness. Negative for mouth sores.   Eyes:  Positive for dryness.  Respiratory:  Negative for shortness of breath.   Cardiovascular:  Negative for chest pain and palpitations.  Gastrointestinal:  Positive for constipation. Negative for blood in stool and diarrhea.  Endocrine: Negative for increased urination.  Genitourinary:  Negative for involuntary urination.  Musculoskeletal:  Positive for joint pain, gait problem, joint pain, joint swelling, myalgias, muscle weakness, morning stiffness, muscle tenderness and myalgias.  Skin:  Positive for rash. Negative for color change, hair loss and sensitivity to sunlight.  Allergic/Immunologic: Negative for susceptible to infections.  Neurological:  Positive for headaches. Negative for dizziness.  Hematological:  Negative for swollen glands.  Psychiatric/Behavioral:  Positive for depressed mood. Negative for sleep disturbance. The patient is nervous/anxious.     PMFS History:  Patient Active Problem List   Diagnosis Date Noted   Primary osteoarthritis involving multiple joints 06/20/2023   Long term (current) use of systemic steroids 06/20/2023   High risk medication use 05/12/2023   Vitamin D deficiency 05/12/2023   Tremor 02/11/2023   Encounter for general adult medical examination with abnormal findings 02/11/2023   Fall 02/11/2023   Candidal skin infection 12/04/2022   Urinary incontinence 12/04/2022   GERD (gastroesophageal reflux disease) 10/22/2022   Hyperlipidemia associated with type 2 diabetes mellitus (HCC) 10/22/2022   Hyperparathyroidism (HCC) 12/03/2020   Multiple thyroid nodules 12/03/2020   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  Eyes:     Conjunctiva/sclera: Conjunctivae normal.  Cardiovascular:     Rate and Rhythm: Normal rate and regular rhythm.  Pulmonary:     Effort: Pulmonary effort is normal.     Breath sounds: Normal breath sounds.  Lymphadenopathy:     Cervical: No cervical adenopathy.  Skin:    General: Skin is warm and dry.     Findings: Rash present.     Comments: Mild scaling on scalp, left of midline Right 2nd finger scaling on flexor side over middle phalanx, no palmar rash Extensive bruising on dorsum of both hands and forearms Hyperpigmentation and skin tightening circumferentially at distal shins, varicose veins, no pitting edema  Neurological:     Mental Status: She is alert.  Psychiatric:        Mood and Affect: Mood normal.      Musculoskeletal Exam:  Elbows full ROM no tenderness or swelling Wrists full ROM no tenderness or  swelling Fingers full ROM bony nodules and slight deviation right 2nd DIP, left thumb 1st CMC tenderness, no palpable swelling Lateral hip tenderness to pressure on both sides Knees full ROM, medial joint line tenderness to pressure and pain with full flexion and extension, no palpable effusions Ankles full ROM no tenderness or swelling    Investigation: No additional findings.  Imaging: MM 3D SCREENING MAMMOGRAM BILATERAL BREAST  Result Date: 05/31/2023 CLINICAL DATA:  Screening. EXAM: DIGITAL SCREENING BILATERAL MAMMOGRAM WITH TOMOSYNTHESIS AND CAD TECHNIQUE: Bilateral screening digital craniocaudal and mediolateral oblique mammograms were obtained. Bilateral screening digital breast tomosynthesis was performed. The images were evaluated with computer-aided detection. COMPARISON:  Previous exam(s). ACR Breast Density Category b: There are scattered areas of fibroglandular density. FINDINGS: There are no findings suspicious for malignancy. IMPRESSION: No mammographic evidence of malignancy. A result letter of this screening mammogram will be mailed directly to the patient. RECOMMENDATION: Screening mammogram in one year. (Code:SM-B-01Y) BI-RADS CATEGORY  1: Negative. Electronically Signed   By: Harmon Pier M.D.   On: 05/31/2023 17:41   DG BONE DENSITY (DXA)  Result Date: 05/24/2023 EXAM: DUAL X-RAY ABSORPTIOMETRY (DXA) FOR BONE MINERAL DENSITY IMPRESSION: Referring Physician:  Jamesetta Orleans [name] Your patient completed a bone mineral density test using GE Lunar iDXA system (analysis version: 16). Technologist: ALW PATIENT: Name: Valerie [name], Valerie [name] Patient ID: [id number] Birth Date: April 08, 1961 Height: 61.7 in. Sex: Female Measured: 05/24/2023 Weight: 184.0 lbs. Indications: Bilateral Hip Replacement, Caucasian, History of Fracture (Adult), Hysterectomy, Keppra, Omeprazole, Prednisone, Tobacco User (Current Smoker) Fractures: Clavicle, Forearm Treatments: Multivitamin ASSESSMENT: The BMD measured at Left  Forearm Radius 33% is 0.730 g/cm2 with a T-score of -1.7. This patient is considered to have osteopenia/low bone mass according to World Health Organization [facility]) criteria. The scan quality is good. Bilateral hips excluded due to surgical hardware. No FRAX due to no hip images. Site Region Measured Date Measured Age YA BMD Significant CHANGE T-score Left Forearm Radius 33% 05/24/2023 63.4 -1.7 0.730 g/cm2 AP Spine L1-L4 05/24/2023 63.4 -1.6 0.997 g/cm2 World Health Organization [facility]) criteria for post-menopausal, Caucasian Women: Normal       T-score at or above -1 SD Osteopenia   T-score between -1 and -2.5 SD Osteoporosis T-score at or below -2.5 SD RECOMMENDATION: 1. All patients should optimize calcium and vitamin D intake. 2. Consider FDA approved medical therapies in postmenopausal women and men aged 36  Office Visit Note  [name]             Date of Birth: 1961/07/15           MRN: [id number]             PCP: Myrlene Broker, MD Referring: Myrlene Broker, * Visit Date: 06/01/2023   Subjective:  Follow-up (Patient states she feels really bad. )   History of Present Illness: Valerie [name] is a 62 y.o. female here for follow up with joint pain in multiple areas concern for psoriatic arthritis.  Evaluation at our initial visit was overall unremarkable CRP just slightly outside normal at 8.7 x-rays without any erosive disease.  She continues to have joint pain in multiple areas including her back and legs and hands.  Bone density scan from last month is consistent with osteopenia with T-score -1.7.  Previous HPI 05/12/23 Valerie [name] is a 62 y.o. female here for evaluation and treatment for psoriatic arthritis.  Previous diagnosis and treatment with her internist in [place] with previous treatments including hydroxychloroquine, methotrexate, Enbrel, and long-term prednisone.  She states originally evaluated with reportedly borderline labs for RA check due to hand swelling but was ultimately diagnosed with psoriatic disease.  She does not recall exact sequence of treatments but remembers drugs were stopped after sepsis event in 2020 that required long-term antibiotics and stopping her DMARD treatments.  She has had bilateral total hip arthroplasty.  Currently treated with prednisone 5 mg daily she has been on higher doses up to 20 mg daily in the past. But recently has issues with persistent pain more around the shoulders hips and knees affecting both sides.  Intermittently sees swelling in the hands and feet.  On the scalp and occurring on the right index finger.  Not on any specific topical medication prescriptions for this.     Labs reviewed 04/2023 eGFR 38.41 03/2023 Vit D 60.11   Review of Systems  Constitutional:  Positive for fatigue.  HENT:  Positive  for mouth dryness. Negative for mouth sores.   Eyes:  Positive for dryness.  Respiratory:  Negative for shortness of breath.   Cardiovascular:  Negative for chest pain and palpitations.  Gastrointestinal:  Positive for constipation. Negative for blood in stool and diarrhea.  Endocrine: Negative for increased urination.  Genitourinary:  Negative for involuntary urination.  Musculoskeletal:  Positive for joint pain, gait problem, joint pain, joint swelling, myalgias, muscle weakness, morning stiffness, muscle tenderness and myalgias.  Skin:  Positive for rash. Negative for color change, hair loss and sensitivity to sunlight.  Allergic/Immunologic: Negative for susceptible to infections.  Neurological:  Positive for headaches. Negative for dizziness.  Hematological:  Negative for swollen glands.  Psychiatric/Behavioral:  Positive for depressed mood. Negative for sleep disturbance. The patient is nervous/anxious.     PMFS History:  Patient Active Problem List   Diagnosis Date Noted   Primary osteoarthritis involving multiple joints 06/20/2023   Long term (current) use of systemic steroids 06/20/2023   High risk medication use 05/12/2023   Vitamin D deficiency 05/12/2023   Tremor 02/11/2023   Encounter for general adult medical examination with abnormal findings 02/11/2023   Fall 02/11/2023   Candidal skin infection 12/04/2022   Urinary incontinence 12/04/2022   GERD (gastroesophageal reflux disease) 10/22/2022   Hyperlipidemia associated with type 2 diabetes mellitus (HCC) 10/22/2022   Hyperparathyroidism (HCC) 12/03/2020   Multiple thyroid nodules 12/03/2020   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  Eyes:     Conjunctiva/sclera: Conjunctivae normal.  Cardiovascular:     Rate and Rhythm: Normal rate and regular rhythm.  Pulmonary:     Effort: Pulmonary effort is normal.     Breath sounds: Normal breath sounds.  Lymphadenopathy:     Cervical: No cervical adenopathy.  Skin:    General: Skin is warm and dry.     Findings: Rash present.     Comments: Mild scaling on scalp, left of midline Right 2nd finger scaling on flexor side over middle phalanx, no palmar rash Extensive bruising on dorsum of both hands and forearms Hyperpigmentation and skin tightening circumferentially at distal shins, varicose veins, no pitting edema  Neurological:     Mental Status: She is alert.  Psychiatric:        Mood and Affect: Mood normal.      Musculoskeletal Exam:  Elbows full ROM no tenderness or swelling Wrists full ROM no tenderness or  swelling Fingers full ROM bony nodules and slight deviation right 2nd DIP, left thumb 1st CMC tenderness, no palpable swelling Lateral hip tenderness to pressure on both sides Knees full ROM, medial joint line tenderness to pressure and pain with full flexion and extension, no palpable effusions Ankles full ROM no tenderness or swelling    Investigation: No additional findings.  Imaging: MM 3D SCREENING MAMMOGRAM BILATERAL BREAST  Result Date: 05/31/2023 CLINICAL DATA:  Screening. EXAM: DIGITAL SCREENING BILATERAL MAMMOGRAM WITH TOMOSYNTHESIS AND CAD TECHNIQUE: Bilateral screening digital craniocaudal and mediolateral oblique mammograms were obtained. Bilateral screening digital breast tomosynthesis was performed. The images were evaluated with computer-aided detection. COMPARISON:  Previous exam(s). ACR Breast Density Category b: There are scattered areas of fibroglandular density. FINDINGS: There are no findings suspicious for malignancy. IMPRESSION: No mammographic evidence of malignancy. A result letter of this screening mammogram will be mailed directly to the patient. RECOMMENDATION: Screening mammogram in one year. (Code:SM-B-01Y) BI-RADS CATEGORY  1: Negative. Electronically Signed   By: Harmon Pier M.D.   On: 05/31/2023 17:41   DG BONE DENSITY (DXA)  Result Date: 05/24/2023 EXAM: DUAL X-RAY ABSORPTIOMETRY (DXA) FOR BONE MINERAL DENSITY IMPRESSION: Referring Physician:  Jamesetta Orleans [name] Your patient completed a bone mineral density test using GE Lunar iDXA system (analysis version: 16). Technologist: ALW PATIENT: Name: Valerie [name], Valerie [name] Patient ID: [id number] Birth Date: April 08, 1961 Height: 61.7 in. Sex: Female Measured: 05/24/2023 Weight: 184.0 lbs. Indications: Bilateral Hip Replacement, Caucasian, History of Fracture (Adult), Hysterectomy, Keppra, Omeprazole, Prednisone, Tobacco User (Current Smoker) Fractures: Clavicle, Forearm Treatments: Multivitamin ASSESSMENT: The BMD measured at Left  Forearm Radius 33% is 0.730 g/cm2 with a T-score of -1.7. This patient is considered to have osteopenia/low bone mass according to World Health Organization [facility]) criteria. The scan quality is good. Bilateral hips excluded due to surgical hardware. No FRAX due to no hip images. Site Region Measured Date Measured Age YA BMD Significant CHANGE T-score Left Forearm Radius 33% 05/24/2023 63.4 -1.7 0.730 g/cm2 AP Spine L1-L4 05/24/2023 63.4 -1.6 0.997 g/cm2 World Health Organization [facility]) criteria for post-menopausal, Caucasian Women: Normal       T-score at or above -1 SD Osteopenia   T-score between -1 and -2.5 SD Osteoporosis T-score at or below -2.5 SD RECOMMENDATION: 1. All patients should optimize calcium and vitamin D intake. 2. Consider FDA approved medical therapies in postmenopausal women and men aged 36

## 2023-06-01 NOTE — Patient Instructions (Addendum)
I am referring you to see physical therapy due to your degenerative arthritis in the back and knees to help with pain and mobility.  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  Bone density shows osteopenia but is not bad enough to need any new medications please continue the vitamin D and calcium supplementation.   For osteoarthritis several treatments may be beneficial:  - Topical antiinflammatory medicine such as diclofenac or Voltaren can be applied to  affected area as needed. Topical analgesics containing CBD, menthol, or lidocaine can be tried.  - Turmeric has some antiinflammatory effect similar to NSAIDs and may help, if taken as a supplement should not be taken above recommended doses.   - Compressive gloves or sleeve can be helpful to support the joint especially if hurting or swelling with certain activities.  - Physical therapy referral can discuss exercises or activity modification to improve symptoms or strength if needed.  - Local steroid injection is an option if symptoms become worse and not controlled by the above options.

## 2023-06-02 ENCOUNTER — Encounter: Payer: Self-pay | Admitting: Internal Medicine

## 2023-06-07 ENCOUNTER — Ambulatory Visit: Payer: Self-pay | Admitting: Licensed Clinical Social Worker

## 2023-06-07 NOTE — Patient Outreach (Signed)
Care Coordination   06/07/2023 Name: Valerie [name] MRN: [id number] DOB: 1961-02-26   Care Coordination Outreach Attempts:  An unsuccessful telephone outreach was attempted today to offer the patient information about available care coordination services.  Follow Up Plan:  Additional outreach attempts will be made to offer the patient care coordination information and services.   Encounter Outcome:  No Answer   Care Coordination Interventions:  No, not indicated    Kelton Pillar.Glendon Dunwoody MSW, LCSW Licensed Visual merchandiser [facility] Care Management [phone]

## 2023-06-14 ENCOUNTER — Ambulatory Visit: Payer: Medicare Other | Admitting: Internal Medicine

## 2023-06-14 ENCOUNTER — Encounter: Payer: Self-pay | Admitting: Internal Medicine

## 2023-06-14 DIAGNOSIS — L4050 Arthropathic psoriasis, unspecified: Secondary | ICD-10-CM

## 2023-06-14 LAB — VITAMIN B12: Vitamin B-12: 741 pg/mL (ref 211–911)

## 2023-06-14 MED ORDER — BETAMETHASONE VALERATE 0.1 % EX OINT: 1.0000 | TOPICAL_OINTMENT | Freq: Two times a day (BID) | CUTANEOUS | 0 refills | Status: DC

## 2023-06-14 NOTE — Patient Instructions (Addendum)
We have sent in betamethasone cream to use on the hands and will check the labs today.

## 2023-06-14 NOTE — Progress Notes (Signed)
Subjective:   Patient ID: Valerie [name], female    DOB: [birth year], 62 y.o.   MRN: [id number]  HPI The patient is a 62 YO female coming in for worsening tiredness and pain all over. She has seen rheumatology and they think she is having osteoarthritis which can be causing her pain. She is suffering and poor QOL at this time. Also new psoriasis rash on the hand and elbow area at this time.  Review of Systems  Constitutional:  Positive for activity change and fatigue.  HENT: Negative.    Eyes: Negative.   Respiratory:  Negative for cough, chest tightness and shortness of breath.   Cardiovascular:  Negative for chest pain, palpitations and leg swelling.  Gastrointestinal:  Negative for abdominal distention, abdominal pain, constipation, diarrhea, nausea and vomiting.  Musculoskeletal:  Positive for arthralgias and myalgias.  Skin: Negative.   Neurological: Negative.   Psychiatric/Behavioral: Negative.      Objective:  Physical Exam Constitutional:      Appearance: She is well-developed.  HENT:     Head: Normocephalic and atraumatic.  Cardiovascular:     Rate and Rhythm: Normal rate and regular rhythm.  Pulmonary:     Effort: Pulmonary effort is normal. No respiratory distress.     Breath sounds: Normal breath sounds. No wheezing or rales.  Abdominal:     General: Bowel sounds are normal. There is no distension.     Palpations: Abdomen is soft.     Tenderness: There is no abdominal tenderness. There is no rebound.  Musculoskeletal:        General: Tenderness present.     Cervical back: Normal range of motion.  Skin:    General: Skin is warm and dry.  Neurological:     Mental Status: She is alert and oriented to person, place, and time.     Coordination: Coordination normal.     Vitals:   06/14/23 1411  BP: 118/82  Pulse: 68  Temp: 98.3 F (36.8 C)  TempSrc: Oral  SpO2: 94%  Weight: 185 lb (83.9 kg)  Height: 5\' 2"  (1.575 m)    Assessment & Plan:

## 2023-06-15 ENCOUNTER — Encounter: Payer: Self-pay | Admitting: Internal Medicine

## 2023-06-16 NOTE — Telephone Encounter (Signed)
Please advise as MD is out of office

## 2023-06-17 ENCOUNTER — Telehealth: Payer: Self-pay | Admitting: Internal Medicine

## 2023-06-17 DIAGNOSIS — M7918 Myalgia, other site: Secondary | ICD-10-CM | POA: Diagnosis not present

## 2023-06-17 DIAGNOSIS — G300 Alzheimer's disease with early onset: Secondary | ICD-10-CM | POA: Diagnosis not present

## 2023-06-17 NOTE — Telephone Encounter (Signed)
Patient called stating at her appointment on 06/01/23 Dr. Dimple Casey told her he was referring her to physical therapy, as well as a hand specialist.  Patient states she hasn't received a call from either office to schedule an appointment and requested a return call.

## 2023-06-18 ENCOUNTER — Other Ambulatory Visit: Payer: Self-pay | Admitting: Specialist

## 2023-06-18 DIAGNOSIS — M5417 Radiculopathy, lumbosacral region: Secondary | ICD-10-CM

## 2023-06-18 NOTE — Assessment & Plan Note (Signed)
With new skin lesion on the hand and elbow rx betamethasone ointment to use. She is seeing rheumatology and is on chronic prednisone. We discussed that this is not ideal and can cause progression of arthritis and bone loss more quickly than expected for age. Checking B12 as this has not been checked in some time. Other labs and vitamins have been checked recently.

## 2023-06-20 DIAGNOSIS — M150 Primary generalized (osteo)arthritis: Secondary | ICD-10-CM | POA: Insufficient documentation

## 2023-06-20 DIAGNOSIS — Z7952 Long term (current) use of systemic steroids: Secondary | ICD-10-CM | POA: Insufficient documentation

## 2023-06-20 NOTE — Telephone Encounter (Signed)
Referrals are both sent now. Delay on my part, not the receiving offices.

## 2023-06-21 NOTE — Telephone Encounter (Signed)
Patient advised Referrals are both sent now. Delay on Dr. Gregary Cromer part, not the receiving offices. Patient verbalized understanding.

## 2023-07-07 ENCOUNTER — Ambulatory Visit
Admission: RE | Admit: 2023-07-07 | Discharge: 2023-07-07 | Disposition: A | Discharge status: Home / Self Care | Payer: Medicare Other | Source: Ambulatory Visit | Attending: Specialist | Admitting: Specialist

## 2023-07-07 DIAGNOSIS — M5417 Radiculopathy, lumbosacral region: Secondary | ICD-10-CM

## 2023-07-07 DIAGNOSIS — M48061 Spinal stenosis, lumbar region without neurogenic claudication: Secondary | ICD-10-CM | POA: Diagnosis not present

## 2023-07-07 DIAGNOSIS — M4316 Spondylolisthesis, lumbar region: Secondary | ICD-10-CM | POA: Diagnosis not present

## 2023-07-13 ENCOUNTER — Encounter: Payer: Medicare Other | Admitting: Internal Medicine

## 2023-07-15 DIAGNOSIS — Z8673 Personal history of transient ischemic attack (TIA), and cerebral infarction without residual deficits: Secondary | ICD-10-CM | POA: Diagnosis not present

## 2023-07-15 DIAGNOSIS — M7912 Myalgia of auxiliary muscles, head and neck: Secondary | ICD-10-CM | POA: Diagnosis not present

## 2023-07-15 DIAGNOSIS — M5417 Radiculopathy, lumbosacral region: Secondary | ICD-10-CM | POA: Diagnosis not present

## 2023-07-20 ENCOUNTER — Ambulatory Visit: Payer: Self-pay | Admitting: Licensed Clinical Social Worker

## 2023-07-20 NOTE — Patient Outreach (Signed)
Care Coordination   Follow Up Visit Note   07/20/2023 Name: Ikran Newhouse MRN: [id number] DOB: [birth year]  Harlyn Sivilay is a 62 y.o. year old female who sees Myrlene Broker, MD for primary care. I spoke with  [name] by phone today.  What matters to the patients health and wellness today?  Patient Stated she has stress related to finances, related to buying medications, and related to procuring food    Goals Addressed             This Visit's Progress    Patient Stated she has stress related to finances, related to buying medications, and related to procuring food       Interventions:  Spoke with  client via phone today about client needs and status. Client said that her brother died in Jun 15, 2023 and that this has been difficult for client to manage. Client said she does talk with a counselor via phone regularly and that this counseling support is helpful.  Also, LCSW mentioned to [facility] that she might consider calling [facility] to talk with agency about grief support or bereavement support services Discussed program support with RN, LCSW, Pharmacist Discussed medication procurement.  Client sometimes has some difficulty in paying for her medications Discussed ambulation of client. Client uses a cane daily to help her walk Discussed her activities at local gym. She said she likes to use treadmill at local gym and exercise bike at local gym.  Discussed self care activities for client. She is planning to visit her children tomorrow in [place], [place]. She plans to visit her sister on Thursday in [place], [place] Discussed family support for client. Client has support from her daughter and from her son Discussed food needs of client. LCSW recommended client call [facility] to see if she might be able to get some needed food items from that agency Discussed sleeping of client. She said she feels that she is sleeping more recently Discussed pain issues  of client.  Client said she is considering changing PCP providers. She said a medical practice is only a few miles from where she resides and she is thinking of contacting that practice for possible medical care. It is very convenient to where she resides Thanked client for phone call with LCSW today Encouraged client  or Waldene Hegger to call LCSW as needed for SW support for client at [phone].           SDOH assessments and interventions completed:  Yes  SDOH Interventions Today    Flowsheet Row Most Recent Value  SDOH Interventions   Depression Interventions/Treatment  Currently on Treatment, Medication  Physical Activity Interventions Other (Comments)  [she likes to go to the gym when she is able to exercise]  Stress Interventions Other (Comment)  [client has financial stress,  sometimes it is hard to buy grocery items needed, difficult to pay copays at medical offices]        Care Coordination Interventions:  Yes, provided  Interventions Today    Flowsheet Row Most Recent Value  Chronic Disease   Chronic disease during today's visit Other  [spoke with client about client needs]  General Interventions   General Interventions Discussed/Reviewed General Interventions Discussed, [employer]  [discussed [employer] as Psychologist, forensic. Discussed [facility] as agency that could possibly help her with managing grief issues faced.]  Education Interventions   Education Provided Provided Education  Provided Verbal Education On [employer]  Mental Health Interventions   Mental  Health Discussed/Reviewed Coping Strategies  [discussed self care techniques of client. discussed coping skills to manage issues faced. Client has appointments via phone regularly with counselor. These sessions are helpful to client and she looks forward to counseling sessions]  Nutrition Interventions   Nutrition Discussed/Reviewed Nutrition Discussed  Pharmacy Interventions    Pharmacy Dicussed/Reviewed Pharmacy Topics Discussed  Safety Interventions   Safety Discussed/Reviewed Fall Risk        Follow up plan: Follow up call scheduled for 09/07/23 at 2:00 PM     Encounter Outcome:  Patient Visit Completed   Kelton Pillar.Natalija Mavis MSW, LCSW Licensed Visual merchandiser [facility] Care Management [phone]

## 2023-07-20 NOTE — Patient Instructions (Signed)
Visit Information  Thank you for taking time to visit with me today. Please don't hesitate to contact me if I can be of assistance to you.   Following are the goals we discussed today:   Goals Addressed             This Visit's Progress    Patient Stated she has stress related to finances, related to buying medications, and related to procuring food       Interventions:  Spoke with  client via phone today about client needs and status. Client said that her brother died in 06/18/2023 and that this has been difficult for client to manage. Client said she does talk with a counselor via phone regularly and that this counseling support is helpful.  Also, LCSW mentioned to [facility] that she might consider calling [facility] to talk with agency about grief support or bereavement support services Discussed program support with RN, LCSW, Pharmacist Discussed medication procurement.  Client sometimes has some difficulty in paying for her medications Discussed ambulation of client. Client uses a cane daily to help her walk Discussed her activities at local gym. She said she likes to use treadmill at local gym and exercise bike at local gym.  Discussed self care activities for client. She is planning to visit her children tomorrow in [place], [place]. She plans to visit her sister on Thursday in [place], [place] Discussed family support for client. Client has support from her daughter and from her son Discussed food needs of client. LCSW recommended client call [facility] to see if she might be able to get some needed food items from that agency Discussed sleeping of client. She said she feels that she is sleeping more recently Discussed pain issues of client.  Client said she is considering changing PCP providers. She said a medical practice is only a few miles from where she resides and she is thinking of contacting that practice for possible medical care. It is very convenient to  where she resides Thanked client for phone call with LCSW today Encouraged client  or Devinne Neyer to call LCSW as needed for SW support for client at [phone].           Our next appointment is by telephone on 09/07/23 at 2:00 PM   Please call the care guide team at [phone] if you need to cancel or reschedule your appointment.   If you are experiencing a Mental Health or Behavioral Health Crisis or need someone to talk to, please go to [facility] [address] [place] [phone])   The patient verbalized understanding of instructions, educational materials, and care plan provided today and DECLINED offer to receive copy of patient instructions, educational materials, and care plan.   The patient has been provided with contact information for the care management team and has been advised to call with any health related questions or concerns.   Kelton Pillar.Shunda Rabadi MSW, LCSW Licensed Visual merchandiser [facility] Care Management [phone]

## 2023-07-23 ENCOUNTER — Emergency Department (HOSPITAL_COMMUNITY): Payer: Medicare Other

## 2023-07-23 ENCOUNTER — Inpatient Hospital Stay (HOSPITAL_COMMUNITY)
Admission: EM | Admit: 2023-07-23 | Discharge: 2023-07-28 | DRG: 872 | Disposition: A | Discharge status: Skilled Nursing Facility | Payer: Medicare Other | Attending: Family Medicine | Admitting: Family Medicine

## 2023-07-23 ENCOUNTER — Encounter (HOSPITAL_COMMUNITY): Payer: Self-pay

## 2023-07-23 ENCOUNTER — Other Ambulatory Visit: Payer: Self-pay

## 2023-07-23 DIAGNOSIS — Z96643 Presence of artificial hip joint, bilateral: Secondary | ICD-10-CM | POA: Diagnosis present

## 2023-07-23 DIAGNOSIS — I700 Atherosclerosis of aorta: Secondary | ICD-10-CM | POA: Diagnosis not present

## 2023-07-23 DIAGNOSIS — F17210 Nicotine dependence, cigarettes, uncomplicated: Secondary | ICD-10-CM | POA: Diagnosis present

## 2023-07-23 DIAGNOSIS — R42 Dizziness and giddiness: Secondary | ICD-10-CM | POA: Diagnosis not present

## 2023-07-23 DIAGNOSIS — F319 Bipolar disorder, unspecified: Secondary | ICD-10-CM | POA: Diagnosis present

## 2023-07-23 DIAGNOSIS — R569 Unspecified convulsions: Secondary | ICD-10-CM

## 2023-07-23 DIAGNOSIS — Z888 Allergy status to other drugs, medicaments and biological substances status: Secondary | ICD-10-CM | POA: Diagnosis not present

## 2023-07-23 DIAGNOSIS — Y92009 Unspecified place in unspecified non-institutional (private) residence as the place of occurrence of the external cause: Secondary | ICD-10-CM | POA: Diagnosis not present

## 2023-07-23 DIAGNOSIS — G20A1 Parkinson's disease without dyskinesia, without mention of fluctuations: Secondary | ICD-10-CM | POA: Diagnosis not present

## 2023-07-23 DIAGNOSIS — A4151 Sepsis due to Escherichia coli [E. coli]: Secondary | ICD-10-CM | POA: Diagnosis present

## 2023-07-23 DIAGNOSIS — T40605A Adverse effect of unspecified narcotics, initial encounter: Secondary | ICD-10-CM | POA: Diagnosis not present

## 2023-07-23 DIAGNOSIS — Z90710 Acquired absence of both cervix and uterus: Secondary | ICD-10-CM | POA: Diagnosis not present

## 2023-07-23 DIAGNOSIS — E1122 Type 2 diabetes mellitus with diabetic chronic kidney disease: Secondary | ICD-10-CM | POA: Diagnosis not present

## 2023-07-23 DIAGNOSIS — R109 Unspecified abdominal pain: Secondary | ICD-10-CM | POA: Diagnosis not present

## 2023-07-23 DIAGNOSIS — S299XXA Unspecified injury of thorax, initial encounter: Secondary | ICD-10-CM | POA: Diagnosis not present

## 2023-07-23 DIAGNOSIS — F0283 Dementia in other diseases classified elsewhere, unspecified severity, with mood disturbance: Secondary | ICD-10-CM | POA: Diagnosis present

## 2023-07-23 DIAGNOSIS — R531 Weakness: Secondary | ICD-10-CM | POA: Diagnosis not present

## 2023-07-23 DIAGNOSIS — K219 Gastro-esophageal reflux disease without esophagitis: Secondary | ICD-10-CM | POA: Diagnosis present

## 2023-07-23 DIAGNOSIS — M1711 Unilateral primary osteoarthritis, right knee: Secondary | ICD-10-CM | POA: Diagnosis not present

## 2023-07-23 DIAGNOSIS — G43E11 Chronic migraine with aura, intractable, with status migrainosus: Secondary | ICD-10-CM | POA: Diagnosis not present

## 2023-07-23 DIAGNOSIS — F0284 Dementia in other diseases classified elsewhere, unspecified severity, with anxiety: Secondary | ICD-10-CM | POA: Diagnosis present

## 2023-07-23 DIAGNOSIS — I129 Hypertensive chronic kidney disease with stage 1 through stage 4 chronic kidney disease, or unspecified chronic kidney disease: Secondary | ICD-10-CM | POA: Diagnosis not present

## 2023-07-23 DIAGNOSIS — K5730 Diverticulosis of large intestine without perforation or abscess without bleeding: Secondary | ICD-10-CM | POA: Diagnosis not present

## 2023-07-23 DIAGNOSIS — K5903 Drug induced constipation: Secondary | ICD-10-CM | POA: Diagnosis not present

## 2023-07-23 DIAGNOSIS — I10 Essential (primary) hypertension: Secondary | ICD-10-CM | POA: Diagnosis not present

## 2023-07-23 DIAGNOSIS — R8281 Pyuria: Secondary | ICD-10-CM | POA: Diagnosis not present

## 2023-07-23 DIAGNOSIS — W109XXA Fall (on) (from) unspecified stairs and steps, initial encounter: Secondary | ICD-10-CM | POA: Diagnosis present

## 2023-07-23 DIAGNOSIS — M25461 Effusion, right knee: Secondary | ICD-10-CM | POA: Diagnosis not present

## 2023-07-23 DIAGNOSIS — S0990XA Unspecified injury of head, initial encounter: Secondary | ICD-10-CM | POA: Diagnosis not present

## 2023-07-23 DIAGNOSIS — Z6836 Body mass index (BMI) 36.0-36.9, adult: Secondary | ICD-10-CM | POA: Diagnosis not present

## 2023-07-23 DIAGNOSIS — M25561 Pain in right knee: Secondary | ICD-10-CM | POA: Diagnosis present

## 2023-07-23 DIAGNOSIS — E559 Vitamin D deficiency, unspecified: Secondary | ICD-10-CM | POA: Diagnosis not present

## 2023-07-23 DIAGNOSIS — E669 Obesity, unspecified: Secondary | ICD-10-CM | POA: Diagnosis present

## 2023-07-23 DIAGNOSIS — A419 Sepsis, unspecified organism: Secondary | ICD-10-CM | POA: Diagnosis not present

## 2023-07-23 DIAGNOSIS — W19XXXA Unspecified fall, initial encounter: Principal | ICD-10-CM

## 2023-07-23 DIAGNOSIS — Y92239 Unspecified place in hospital as the place of occurrence of the external cause: Secondary | ICD-10-CM | POA: Diagnosis not present

## 2023-07-23 DIAGNOSIS — G9341 Metabolic encephalopathy: Secondary | ICD-10-CM | POA: Diagnosis not present

## 2023-07-23 DIAGNOSIS — K5900 Constipation, unspecified: Secondary | ICD-10-CM | POA: Diagnosis not present

## 2023-07-23 DIAGNOSIS — E1165 Type 2 diabetes mellitus with hyperglycemia: Secondary | ICD-10-CM | POA: Diagnosis not present

## 2023-07-23 DIAGNOSIS — N10 Acute tubulo-interstitial nephritis: Secondary | ICD-10-CM | POA: Diagnosis present

## 2023-07-23 DIAGNOSIS — G20C Parkinsonism, unspecified: Secondary | ICD-10-CM | POA: Diagnosis not present

## 2023-07-23 DIAGNOSIS — N1832 Chronic kidney disease, stage 3b: Secondary | ICD-10-CM | POA: Diagnosis not present

## 2023-07-23 DIAGNOSIS — I252 Old myocardial infarction: Secondary | ICD-10-CM

## 2023-07-23 DIAGNOSIS — Z7952 Long term (current) use of systemic steroids: Secondary | ICD-10-CM

## 2023-07-23 DIAGNOSIS — E872 Acidosis, unspecified: Secondary | ICD-10-CM | POA: Diagnosis present

## 2023-07-23 DIAGNOSIS — R339 Retention of urine, unspecified: Secondary | ICD-10-CM | POA: Diagnosis not present

## 2023-07-23 DIAGNOSIS — G40909 Epilepsy, unspecified, not intractable, without status epilepticus: Secondary | ICD-10-CM | POA: Diagnosis present

## 2023-07-23 DIAGNOSIS — Z7401 Bed confinement status: Secondary | ICD-10-CM | POA: Diagnosis not present

## 2023-07-23 DIAGNOSIS — R296 Repeated falls: Secondary | ICD-10-CM | POA: Diagnosis present

## 2023-07-23 DIAGNOSIS — I771 Stricture of artery: Secondary | ICD-10-CM | POA: Diagnosis not present

## 2023-07-23 DIAGNOSIS — N12 Tubulo-interstitial nephritis, not specified as acute or chronic: Secondary | ICD-10-CM

## 2023-07-23 DIAGNOSIS — G4733 Obstructive sleep apnea (adult) (pediatric): Secondary | ICD-10-CM | POA: Diagnosis present

## 2023-07-23 DIAGNOSIS — R7881 Bacteremia: Secondary | ICD-10-CM | POA: Diagnosis not present

## 2023-07-23 DIAGNOSIS — R251 Tremor, unspecified: Secondary | ICD-10-CM | POA: Diagnosis not present

## 2023-07-23 DIAGNOSIS — N183 Chronic kidney disease, stage 3 unspecified: Secondary | ICD-10-CM | POA: Diagnosis present

## 2023-07-23 DIAGNOSIS — B9620 Unspecified Escherichia coli [E. coli] as the cause of diseases classified elsewhere: Secondary | ICD-10-CM | POA: Insufficient documentation

## 2023-07-23 DIAGNOSIS — Z9181 History of falling: Secondary | ICD-10-CM | POA: Diagnosis not present

## 2023-07-23 DIAGNOSIS — R6889 Other general symptoms and signs: Secondary | ICD-10-CM | POA: Diagnosis not present

## 2023-07-23 DIAGNOSIS — Z79899 Other long term (current) drug therapy: Secondary | ICD-10-CM

## 2023-07-23 DIAGNOSIS — N390 Urinary tract infection, site not specified: Secondary | ICD-10-CM | POA: Diagnosis not present

## 2023-07-23 DIAGNOSIS — Z9049 Acquired absence of other specified parts of digestive tract: Secondary | ICD-10-CM

## 2023-07-23 DIAGNOSIS — M25559 Pain in unspecified hip: Secondary | ICD-10-CM | POA: Diagnosis not present

## 2023-07-23 DIAGNOSIS — S8991XA Unspecified injury of right lower leg, initial encounter: Secondary | ICD-10-CM | POA: Diagnosis not present

## 2023-07-23 DIAGNOSIS — R930 Abnormal findings on diagnostic imaging of skull and head, not elsewhere classified: Secondary | ICD-10-CM | POA: Diagnosis not present

## 2023-07-23 DIAGNOSIS — E785 Hyperlipidemia, unspecified: Secondary | ICD-10-CM | POA: Diagnosis present

## 2023-07-23 DIAGNOSIS — Z28310 Unvaccinated for covid-19: Secondary | ICD-10-CM | POA: Diagnosis not present

## 2023-07-23 DIAGNOSIS — R29818 Other symptoms and signs involving the nervous system: Secondary | ICD-10-CM | POA: Diagnosis not present

## 2023-07-23 DIAGNOSIS — F419 Anxiety disorder, unspecified: Secondary | ICD-10-CM | POA: Diagnosis present

## 2023-07-23 DIAGNOSIS — Z743 Need for continuous supervision: Secondary | ICD-10-CM | POA: Diagnosis not present

## 2023-07-23 DIAGNOSIS — R079 Chest pain, unspecified: Secondary | ICD-10-CM | POA: Diagnosis not present

## 2023-07-23 DIAGNOSIS — I2510 Atherosclerotic heart disease of native coronary artery without angina pectoris: Secondary | ICD-10-CM | POA: Diagnosis not present

## 2023-07-23 DIAGNOSIS — Z23 Encounter for immunization: Secondary | ICD-10-CM | POA: Diagnosis not present

## 2023-07-23 DIAGNOSIS — I6782 Cerebral ischemia: Secondary | ICD-10-CM | POA: Diagnosis not present

## 2023-07-23 DIAGNOSIS — Z8673 Personal history of transient ischemic attack (TIA), and cerebral infarction without residual deficits: Secondary | ICD-10-CM

## 2023-07-23 HISTORY — DX: Tubulo-interstitial nephritis, not specified as acute or chronic: N12

## 2023-07-23 LAB — I-STAT CHEM 8, ED
BUN: 39 mg/dL — ABNORMAL HIGH (ref 8–23)
Calcium, Ion: 1.07 mmol/L — ABNORMAL LOW (ref 1.15–1.40)
Chloride: 104 mmol/L (ref 98–111)
Creatinine, Ser: 1.5 mg/dL — ABNORMAL HIGH (ref 0.44–1.00)
Glucose, Bld: 147 mg/dL — ABNORMAL HIGH (ref 70–99)
HCT: 34 % — ABNORMAL LOW (ref 36.0–46.0)
Hemoglobin: 11.6 g/dL — ABNORMAL LOW (ref 12.0–15.0)
Potassium: 4.2 mmol/L (ref 3.5–5.1)
Sodium: 138 mmol/L (ref 135–145)
TCO2: 27 mmol/L (ref 22–32)

## 2023-07-23 LAB — COMPREHENSIVE METABOLIC PANEL
ALT: <5 U/L (ref 0–44)
AST: 16 U/L (ref 15–41)
Albumin: 3 g/dL — ABNORMAL LOW (ref 3.5–5.0)
Alkaline Phosphatase: 76 U/L (ref 38–126)
Anion gap: 10 (ref 5–15)
BUN: 27 mg/dL — ABNORMAL HIGH (ref 8–23)
CO2: 24 mmol/L (ref 22–32)
Calcium: 8.3 mg/dL — ABNORMAL LOW (ref 8.9–10.3)
Chloride: 105 mmol/L (ref 98–111)
Creatinine, Ser: 1.57 mg/dL — ABNORMAL HIGH (ref 0.44–1.00)
GFR, Estimated: 37 mL/min — ABNORMAL LOW (ref >60)
Glucose, Bld: 151 mg/dL — ABNORMAL HIGH (ref 70–99)
Potassium: 3.7 mmol/L (ref 3.5–5.1)
Sodium: 139 mmol/L (ref 135–145)
Total Bilirubin: 0.5 mg/dL (ref <1.2)
Total Protein: 5.2 g/dL — ABNORMAL LOW (ref 6.5–8.1)

## 2023-07-23 LAB — CBC
HCT: 36.1 % (ref 36.0–46.0)
Hemoglobin: 11.7 g/dL — ABNORMAL LOW (ref 12.0–15.0)
MCH: 32.7 pg (ref 26.0–34.0)
MCHC: 32.4 g/dL (ref 30.0–36.0)
MCV: 100.8 fL — ABNORMAL HIGH (ref 80.0–100.0)
Platelets: 173 10*3/uL (ref 150–400)
RBC: 3.58 MIL/uL — ABNORMAL LOW (ref 3.87–5.11)
RDW: 14.1 % (ref 11.5–15.5)
WBC: 16.4 10*3/uL — ABNORMAL HIGH (ref 4.0–10.5)
nRBC: 0 % (ref 0.0–0.2)

## 2023-07-23 LAB — I-STAT CG4 LACTIC ACID, ED: Lactic Acid, Venous: 2.1 mmol/L (ref 0.5–1.9)

## 2023-07-23 LAB — URINALYSIS, ROUTINE W REFLEX MICROSCOPIC
Bilirubin Urine: NEGATIVE
Glucose, UA: NEGATIVE mg/dL
Ketones, ur: NEGATIVE mg/dL
Nitrite: NEGATIVE
Protein, ur: 100 mg/dL — AB
Specific Gravity, Urine: 1.029 (ref 1.005–1.030)
WBC, UA: >50 WBC/hpf (ref 0–5)
pH: 5 (ref 5.0–8.0)

## 2023-07-23 LAB — RAPID URINE DRUG SCREEN, HOSP PERFORMED
Amphetamines: NOT DETECTED
Barbiturates: POSITIVE — AB
Benzodiazepines: NOT DETECTED
Cocaine: NOT DETECTED
Opiates: NOT DETECTED
Tetrahydrocannabinol: NOT DETECTED

## 2023-07-23 LAB — ETHANOL: Alcohol, Ethyl (B): <10 mg/dL (ref <10)

## 2023-07-23 LAB — CK: Total CK: 71 U/L (ref 38–234)

## 2023-07-23 MED ORDER — LACTATED RINGERS IV BOLUS
1500.0000 mL | Freq: Once | INTRAVENOUS | Status: AC
  Administered 2023-07-23: 1500 mL via INTRAVENOUS

## 2023-07-23 MED ORDER — SODIUM CHLORIDE 0.9 % IV SOLN
1.0000 g | Freq: Once | INTRAVENOUS | Status: AC
  Administered 2023-07-23: 1 g via INTRAVENOUS
  Filled 2023-07-23: qty 10

## 2023-07-23 MED ORDER — OXYCODONE HCL 5 MG PO TABS
5.0000 mg | ORAL_TABLET | ORAL | Status: AC
  Administered 2023-07-23: 5 mg via ORAL
  Filled 2023-07-23: qty 1

## 2023-07-23 MED ORDER — ACETAMINOPHEN 500 MG PO TABS: 1000.0000 mg | ORAL_TABLET | Freq: Once | ORAL | Status: DC

## 2023-07-23 MED ORDER — LACTATED RINGERS IV BOLUS
1000.0000 mL | Freq: Once | INTRAVENOUS | Status: AC
  Administered 2023-07-23: 1000 mL via INTRAVENOUS

## 2023-07-23 MED ORDER — KETOROLAC TROMETHAMINE 15 MG/ML IJ SOLN
15.0000 mg | Freq: Once | INTRAMUSCULAR | Status: AC
  Administered 2023-07-23: 15 mg via INTRAVENOUS
  Filled 2023-07-23: qty 1

## 2023-07-23 MED ORDER — IOHEXOL 350 MG/ML SOLN
60.0000 mL | Freq: Once | INTRAVENOUS | Status: AC | PRN
  Administered 2023-07-23: 60 mL via INTRAVENOUS

## 2023-07-23 MED ORDER — ONDANSETRON HCL 4 MG/2ML IJ SOLN
4.0000 mg | Freq: Once | INTRAMUSCULAR | Status: AC
  Administered 2023-07-23: 4 mg via INTRAVENOUS
  Filled 2023-07-23: qty 2

## 2023-07-23 NOTE — ED Provider Notes (Signed)
Care of patient received from prior provider at 3:33 PM, please see their note for complete H/P and care plan.  Received handoff per ED course.  Clinical Course as of 07/23/23 1533  Fri Jul 23, 2023  1518 CT CHEST ABDOMEN PELVIS W CONTRAST Subtle hypoenhancement of the upper and lower polar right kidney, which may be artifactual or reflect pyelonephritis. Recommend correlation with urinalysis. [RP]  1520 Dr Derry Lory from neurology consulted. Recommends increasing her Keppra to 1000 mg BID.  [RP]  1531 Stable 62 YOF with a ccx of Fall Sz yesterday causing a fall down 17 steps.  ?pyelonephritis [CC]    Clinical Course User Index [CC] Glyn Ade, MD [RP] Rondel Baton, MD    Reassessment: On my evaluation, patient with borderline sepsis.  She has a leukocytosis, potential source in the form of pyelonephritis, positive lactic acid. After I discussed her situation with her, she endorses that she has a longstanding history of urinary retention and has not been able to pee in 4 days.  This is likely the underlying etiology for her recurrent urinary tract infection.  She had a catheter in place for approximately 2 months for recurrent urinary retention episodes.  States that it feels very similar to her initial presentation with sepsis at that time. Fortunately she came in earlier before she was critically ill.  Her lactic acid was treated with 2 L IV fluid, blood pressure normalized though remained low normal.  She was treated with Rocephin for likely pyelonephritis and arrange for admission for further care and management.  CRITICAL CARE Performed by: Glyn Ade   Total critical care time: 45 minutes for sepsis  Critical care time was exclusive of separately billable procedures and treating other patients.  Critical care was necessary to treat or prevent imminent or life-threatening deterioration.  Critical care was time spent personally by me on the following  activities: development of treatment plan with patient and/or surrogate as well as nursing, discussions with consultants, evaluation of patient's response to treatment, examination of patient, obtaining history from patient or surrogate, ordering and performing treatments and interventions, ordering and review of laboratory studies, ordering and review of radiographic studies, pulse oximetry and re-evaluation of patient's condition.  Disposition:   Based on the above findings, I believe this patient is stable for admission.    Patient/family educated about specific findings on our evaluation and explained exact reasons for admission.  Patient/family educated about clinical situation and time was allowed to answer questions.   Admission team communicated with and agreed with need for admission. Patient admitted. Patient  ready to move at this time.     Emergency Department Medication Summary:   Medications  acetaminophen (TYLENOL) tablet 1,000 mg (1,000 mg Oral Patient Refused/Not Given 07/23/23 1758)  ondansetron (ZOFRAN) injection 4 mg (4 mg Intravenous Given 07/23/23 1414)  ketorolac (TORADOL) 15 MG/ML injection 15 mg (15 mg Intravenous Given 07/23/23 1510)  iohexol (OMNIPAQUE) 350 MG/ML injection 60 mL (60 mLs Intravenous Contrast Given 07/23/23 1443)  lactated ringers bolus 1,500 mL (0 mLs Intravenous Stopped 07/23/23 1844)  oxyCODONE (Oxy IR/ROXICODONE) immediate release tablet 5 mg (5 mg Oral Given 07/23/23 1648)  cefTRIAXone (ROCEPHIN) 1 g in sodium chloride 0.9 % 100 mL IVPB (0 g Intravenous Stopped 07/23/23 1921)  lactated ringers bolus 1,000 mL (1,000 mLs Intravenous New Bag/Given 07/23/23 2149)         Emergency Department Medication Summary:   Medications  acetaminophen (TYLENOL) tablet 1,000 mg (1,000 mg Oral Patient Refused/Not  Given 07/23/23 1758)  ondansetron (ZOFRAN) injection 4 mg (4 mg Intravenous Given 07/23/23 1414)  ketorolac (TORADOL) 15 MG/ML injection 15 mg (15  mg Intravenous Given 07/23/23 1510)  iohexol (OMNIPAQUE) 350 MG/ML injection 60 mL (60 mLs Intravenous Contrast Given 07/23/23 1443)  lactated ringers bolus 1,500 mL (0 mLs Intravenous Stopped 07/23/23 1844)  oxyCODONE (Oxy IR/ROXICODONE) immediate release tablet 5 mg (5 mg Oral Given 07/23/23 1648)  cefTRIAXone (ROCEPHIN) 1 g in sodium chloride 0.9 % 100 mL IVPB (0 g Intravenous Stopped 07/23/23 1921)  lactated ringers bolus 1,000 mL (1,000 mLs Intravenous New Bag/Given 07/23/23 2149)            Glyn Ade, MD 07/23/23 2301

## 2023-07-23 NOTE — ED Provider Notes (Signed)
[place] EMERGENCY DEPARTMENT AT [facility] Provider Note   CSN: [id number] Arrival date & time: 07/23/23  1213     History  Chief Complaint  Patient presents with   Marletta Lor    Valerie [name] is a 62 y.o. female.  62 year old female with a history of seizures on Keppra and Lamictal, bipolar, TIA, and Parkinson's disease who presents to the emergency department after a fall.  Patient reports that she has had 4 falls in the past day.  Says that her first fall she thinks she had a myoclonic seizure with retained awareness and then fell and struck her head.  Reports being compliant with her antiepileptics (Keppra 750 BID and Lamictal 150 BID).  No known seizure triggers.  Says that her last seizure was a month ago.  Says that yesterday she thought that she had some slurred speech that has not fully resolved.  Did have an additional fall where she fell down 17 steps.  Has been having pain on the right side of her body.  Was unable to get up.  Thinks that she just lost her footing that time when she fell.  Not on any blood thinners.       Home Medications Prior to Admission medications   Medication Sig Start Date End Date Taking? Authorizing Provider  ALPRAZolam Prudy Feeler) 0.5 MG tablet Take 1/2 to 1 tablet TID prn 03/20/22   [provider]  betamethasone valerate ointment (VALISONE) 0.1 % Apply 1 Application topically 2 (two) times daily. 06/14/23   Myrlene Broker, MD  carbidopa-levodopa (SINEMET IR) 25-100 MG tablet Take by mouth. 08/27/22   [provider]  cetirizine (ZYRTEC) 10 MG tablet cetirizine 10 mg 06/01/19   [provider]  docusate sodium (COLACE) 100 MG capsule Take 200 mg by mouth at bedtime.    [provider]  donepezil (ARICEPT) 10 MG tablet Take 10 mg by mouth daily. 10/30/19   [provider]  fluconazole (DIFLUCAN) 100 MG tablet Fluconazole 100 mg 06/01/19   [provider]  FLUoxetine (PROZAC) 40 MG  capsule Take 40 mg by mouth 2 (two) times daily. 04/09/22   [provider]  HYDROcodone-acetaminophen (NORCO) 10-325 MG tablet Take 0.5-1 tablets by mouth every 6 (six) hours as needed for moderate pain or severe pain. 12/04/22   Myrlene Broker, MD  lamoTRIgine (LAMICTAL) 150 MG tablet Take 1 tablet by mouth 2 (two) times daily. 12/28/22   [provider]  levETIRAcetam (KEPPRA) 750 MG tablet Take 750 mg by mouth 2 (two) times daily. 08/26/22   [provider]  levocetirizine (XYZAL) 5 MG tablet Take 1 tablet (5 mg total) by mouth every evening. 05/03/23   Gwenlyn Fudge, FNP  lubiprostone (AMITIZA) 8 MCG capsule Take 1 capsule (8 mcg total) by mouth 2 (two) times daily with a meal. 05/03/23   Gwenlyn Fudge, FNP  meclizine (ANTIVERT) 25 MG tablet Take 1 tablet (25 mg total) by mouth 3 (three) times daily as needed for dizziness. 01/11/23   Gwenlyn Fudge, FNP  memantine (NAMENDA) 10 MG tablet TAKE 1 TABLET BY MOUTH EVERY DAY FOR 7 DAYS THEN TAKE 1 TABLET BY MOUTH TWICE DAILY THEREAFTER 03/18/22   [provider]  methocarbamol (ROBAXIN) 500 MG tablet Methocarbamol 500 mg 03/18/21   [provider]  Multiple Vitamins-Minerals (MULTIVITAMIN WITH MINERALS) tablet Take 1 tablet by mouth daily.    [provider]  nitroGLYCERIN (NITROSTAT) 0.4 MG SL tablet Place 1 tablet (  0.4 mg total) under the tongue every 5 (five) minutes as needed for chest pain. 05/03/23   Gwenlyn Fudge, FNP  Omega-3 Fatty Acids (FISH OIL PO) Take by mouth.    [provider]  omeprazole (PRILOSEC) 40 MG capsule Take 1 capsule (40 mg total) by mouth 2 (two) times daily before a meal. 10/21/22   Myrlene Broker, MD  oxybutynin (DITROPAN) 5 MG tablet Take 1 tablet (5 mg total) by mouth 2 (two) times daily. 05/03/23   Gwenlyn Fudge, FNP  predniSONE (DELTASONE) 5 MG tablet Take 1 tablet (5 mg total) by mouth daily with breakfast. 02/10/23   Myrlene Broker, MD   primidone (MYSOLINE) 50 MG tablet Take by mouth. 04/09/22   [provider]  Probiotic Product (PROBIOTIC DAILY PO) Take 1 tablet by mouth daily.    [provider]  rizatriptan (MAXALT-MLT) 10 MG disintegrating tablet Take 10 mg by mouth See admin instructions. Take one tablet (10 mg) by mouth daily as needed for migraine. May repeat in 2 hours if still needed    [provider]  rosuvastatin (CRESTOR) 10 MG tablet Take 1 tablet (10 mg total) by mouth daily. 12/04/22   Myrlene Broker, MD  traZODone (DESYREL) 100 MG tablet Take 2 tablets (200 mg total) by mouth at bedtime. 07/23/20   Sherrie George, PA-C      Allergies    Carbamazepine and Sertraline hcl    Review of Systems   Review of Systems  Physical Exam Updated Vital Signs BP (!) 101/56   Pulse 69   Temp 99.1 F (37.3 C) (Oral)   Resp 17   SpO2 94%  Physical Exam Constitutional:      General: She is not in acute distress.    Appearance: Normal appearance. She is not ill-appearing.  HENT:     Head: Normocephalic and atraumatic.     Right Ear: External ear normal.     Left Ear: External ear normal.     Mouth/Throat:     Mouth: Mucous membranes are moist.     Pharynx: Oropharynx is clear.  Eyes:     Extraocular Movements: Extraocular movements intact.     Conjunctiva/sclera: Conjunctivae normal.     Pupils: Pupils are equal, round, and reactive to light.  Neck:     Comments: C-collar in place Cardiovascular:     Rate and Rhythm: Normal rate and regular rhythm.     Pulses: Normal pulses.     Heart sounds: Normal heart sounds.  Pulmonary:     Effort: Pulmonary effort is normal. No respiratory distress.     Breath sounds: Normal breath sounds.     Comments: Hypoxic requiring new 2 L oxygen requirement Abdominal:     General: Abdomen is flat.     Palpations: Abdomen is soft.     Tenderness: There is no abdominal tenderness. There is no guarding.  Musculoskeletal:        General:  No deformity. Normal range of motion.     Comments: No tenderness to palpation of midline thoracic or lumbar spine.  No step-offs palpated.  No tenderness to palpation of chest wall.  No bruising noted.  No tenderness to palpation of bilateral clavicles.  No tenderness to palpation, bruising, or deformities noted of bilateral shoulders, elbows, wrists, or ankles.  Tenderness to palpation of right hip and knee.  Neurological:     General: No focal deficit present.     Mental Status: She is alert  and oriented to person, place, and time. Mental status is at baseline.     Cranial Nerves: No cranial nerve deficit.     Sensory: No sensory deficit.     Motor: No weakness.     Coordination: Coordination normal.     Comments: No aphasia     ED Results / Procedures / Treatments   Labs (all labs ordered are listed, but only abnormal results are displayed) Labs Reviewed  COMPREHENSIVE METABOLIC PANEL - Abnormal; Notable for the following components:      Result Value   Glucose, Bld 151 (*)    BUN 27 (*)    Creatinine, Ser 1.57 (*)    Calcium 8.3 (*)    Total Protein 5.2 (*)    Albumin 3.0 (*)    GFR, Estimated 37 (*)    All other components within normal limits  CBC - Abnormal; Notable for the following components:   WBC 16.4 (*)    RBC 3.58 (*)    Hemoglobin 11.7 (*)    MCV 100.8 (*)    All other components within normal limits  I-STAT CHEM 8, ED - Abnormal; Notable for the following components:   BUN 39 (*)    Creatinine, Ser 1.50 (*)    Glucose, Bld 147 (*)    Calcium, Ion 1.07 (*)    Hemoglobin 11.6 (*)    HCT 34.0 (*)    All other components within normal limits  I-STAT CG4 LACTIC ACID, ED - Abnormal; Notable for the following components:   Lactic Acid, Venous 2.1 (*)    All other components within normal limits  CULTURE, BLOOD (ROUTINE X 2)  CULTURE, BLOOD (ROUTINE X 2)  ETHANOL  CK  URINALYSIS, ROUTINE W REFLEX MICROSCOPIC  RAPID URINE DRUG SCREEN, HOSP PERFORMED     EKG None  Radiology CT Knee Right Wo Contrast  Result Date: 07/23/2023 CLINICAL DATA:  Knee trauma, occult fracture suspected. Recent fall. EXAM: CT OF THE RIGHT KNEE WITHOUT CONTRAST TECHNIQUE: Multidetector CT imaging of the right knee was performed according to the standard protocol. Multiplanar CT image reconstructions were also generated. RADIATION DOSE REDUCTION: This exam was performed according to the departmental dose-optimization program which includes automated exposure control, adjustment of the mA and/or kV according to patient size and/or use of iterative reconstruction technique. COMPARISON:  Radiographs same date and 05/12/2023. FINDINGS: Bones/Joint/Cartilage The bones are demineralized. Age advanced tricompartmental degenerative changes are again noted, greatest within the medial compartment. There is no evidence of acute fracture, dislocation or osteonecrosis. There is a small nonspecific knee joint effusion without lipohemarthrosis. Possible small loose bodies or fragmented osteophytes anteromedially. Ligaments Suboptimally assessed by CT. Muscles and Tendons Intact extensor mechanism. Mild semimembranosus muscle fatty atrophy. Soft tissues Mild subcutaneous edema anterolaterally. No focal fluid collection, foreign body or soft tissue emphysema. IMPRESSION: 1. No evidence of acute fracture or dislocation. 2. Age advanced tricompartmental degenerative changes, greatest within the medial compartment. 3. Small nonspecific knee joint effusion with possible small loose bodies or fragmented osteophytes anteromedially. Electronically Signed   By: Carey Bullocks M.D.   On: 07/23/2023 16:56   CT CHEST ABDOMEN PELVIS W CONTRAST  Result Date: 07/23/2023 CLINICAL DATA:  One day history of recurrent falls with diffuse pain. EXAM: CT CHEST, ABDOMEN, AND PELVIS WITH CONTRAST TECHNIQUE: Multidetector CT imaging of the chest, abdomen and pelvis was performed following the standard protocol  during bolus administration of intravenous contrast. RADIATION DOSE REDUCTION: This exam was performed according to the departmental dose-optimization  program which includes automated exposure control, adjustment of the mA and/or kV according to patient size and/or use of iterative reconstruction technique. CONTRAST:  60mL OMNIPAQUE IOHEXOL 350 MG/ML SOLN COMPARISON:  Same day chest radiograph, CT abdomen and pelvis dated 09/10/2020 FINDINGS: CT CHEST FINDINGS Cardiovascular: Normal heart size. No significant pericardial fluid/thickening. Great vessels are normal in course and caliber. No central pulmonary emboli. Mediastinum/Nodes: Imaged thyroid gland without nodules meeting criteria for imaging follow-up by size. Normal esophagus. No pathologically enlarged axillary, supraclavicular, mediastinal, or hilar lymph nodes. Lungs/Pleura: The central airways are patent. Subsegmental atelectasis/scarring in all lobes. No pneumothorax. No pleural effusion. Musculoskeletal: Old posterolateral right fourth and anterior right fourth and left fourth through seventh rib fractures. Multilevel degenerative changes of the thoracic spine. Age indeterminate superior endplate compression of T9 and T11. CT ABDOMEN PELVIS FINDINGS Hepatobiliary: No focal hepatic lesions. No intra or extrahepatic biliary ductal dilation. Cholecystectomy. Pancreas: No focal lesions or main ductal dilation. Spleen: Normal in size without focal abnormality. Adrenals/Urinary Tract: No adrenal nodules. Subtle hypoenhancement of the upper and lower polar right kidney (6:15, 25). No suspicious renal mass, calculi, or hydronephrosis. Suboptimal evaluation of the bladder due to beam hardening artifact from hip arthroplasties. Stomach/Bowel: Normal appearance of the stomach. Postsurgical changes of the sigmoid colon. Anastomosis is patent. Slightly tethered appearance of the left hemi abdominal bowel, likely postsurgical. No evidence of bowel wall thickening,  distention, or inflammatory changes. Ovoid focus of intraluminal fat attenuation within left lower quadrant small bowel (3:74) may represent an intraluminal lipoma. Colonic diverticulosis without acute diverticulitis. Appendectomy. Vascular/Lymphatic: Aortic atherosclerosis. No enlarged abdominal or pelvic lymph nodes. Reproductive: No adnexal masses. Other: No free fluid, fluid collection, or free air. Musculoskeletal: No acute or abnormal lytic or blastic osseous findings. Multilevel degenerative changes of the lumbar spine. Old right inferior pubic ramus fracture. Bilateral hip arthroplasties. Postsurgical changes of the anterior abdominal wall. Subcutaneous soft tissue stranding along the posterior right upper quadrant and posterior left lower quadrant. Anterior left lower quadrant soft tissue stranding is likely postprocedural. IMPRESSION: 1. Subtle hypoenhancement of the upper and lower polar right kidney, which may be artifactual or reflect pyelonephritis. Recommend correlation with urinalysis. 2. Subcutaneous soft tissue stranding along the posterior right upper quadrant and posterior left lower quadrant, likely posttraumatic. 3. No acute traumatic injury in the chest. 4.  Aortic Atherosclerosis (ICD10-I70.0). Electronically Signed   By: Agustin Cree M.D.   On: 07/23/2023 15:15   CT HEAD WO CONTRAST  Result Date: 07/23/2023 CLINICAL DATA:  Fall.  Head trauma. EXAM: CT HEAD WITHOUT CONTRAST CT CERVICAL SPINE WITHOUT CONTRAST TECHNIQUE: Multidetector CT imaging of the head and cervical spine was performed following the standard protocol without intravenous contrast. Multiplanar CT image reconstructions of the cervical spine were also generated. RADIATION DOSE REDUCTION: This exam was performed according to the departmental dose-optimization program which includes automated exposure control, adjustment of the mA and/or kV according to patient size and/or use of iterative reconstruction technique. COMPARISON:   08/23/2022 and 11/27/2021. FINDINGS: CT HEAD FINDINGS Brain: No evidence of acute infarction, hemorrhage, hydrocephalus, extra-axial collection or mass lesion/mass effect. Vascular: No hyperdense vessel or unexpected calcification. Skull: Normal. Negative for fracture or focal lesion. Sinuses/Orbits: Globes and orbits are unremarkable. Sinuses are clear. Other: None. CT CERVICAL SPINE FINDINGS Alignment: Mild kyphosis, apex at C4-C5.  No spondylolisthesis. Skull base and vertebrae: No acute fracture. No primary bone lesion or focal pathologic process. Soft tissues and spinal canal: No prevertebral fluid or swelling. No visible canal  hematoma. Disc levels: Mild loss of disc height at C4-C5. Moderate loss of disc height at C5-C6 and C6-C7. Mild disc bulging and endplate spurring noted at these levels. No disc herniation. No significant stenosis. Upper chest: Mild ground-glass opacity in the medial left upper lobe. Stable linear scarring in the left upper lobe near the apex. Other: None. IMPRESSION: CT HEAD: No acute intracranial abnormalities. CT CERVICAL SPINE: 1. No fracture or acute finding. 2. Mild ground-glass opacity in the medial left upper lobe, nonspecific but could reflect a small area of pulmonary contusion. Electronically Signed   By: Amie Portland M.D.   On: 07/23/2023 15:05   CT CERVICAL SPINE WO CONTRAST  Result Date: 07/23/2023 CLINICAL DATA:  Fall.  Head trauma. EXAM: CT HEAD WITHOUT CONTRAST CT CERVICAL SPINE WITHOUT CONTRAST TECHNIQUE: Multidetector CT imaging of the head and cervical spine was performed following the standard protocol without intravenous contrast. Multiplanar CT image reconstructions of the cervical spine were also generated. RADIATION DOSE REDUCTION: This exam was performed according to the departmental dose-optimization program which includes automated exposure control, adjustment of the mA and/or kV according to patient size and/or use of iterative reconstruction technique.  COMPARISON:  08/23/2022 and 11/27/2021. FINDINGS: CT HEAD FINDINGS Brain: No evidence of acute infarction, hemorrhage, hydrocephalus, extra-axial collection or mass lesion/mass effect. Vascular: No hyperdense vessel or unexpected calcification. Skull: Normal. Negative for fracture or focal lesion. Sinuses/Orbits: Globes and orbits are unremarkable. Sinuses are clear. Other: None. CT CERVICAL SPINE FINDINGS Alignment: Mild kyphosis, apex at C4-C5.  No spondylolisthesis. Skull base and vertebrae: No acute fracture. No primary bone lesion or focal pathologic process. Soft tissues and spinal canal: No prevertebral fluid or swelling. No visible canal hematoma. Disc levels: Mild loss of disc height at C4-C5. Moderate loss of disc height at C5-C6 and C6-C7. Mild disc bulging and endplate spurring noted at these levels. No disc herniation. No significant stenosis. Upper chest: Mild ground-glass opacity in the medial left upper lobe. Stable linear scarring in the left upper lobe near the apex. Other: None. IMPRESSION: CT HEAD: No acute intracranial abnormalities. CT CERVICAL SPINE: 1. No fracture or acute finding. 2. Mild ground-glass opacity in the medial left upper lobe, nonspecific but could reflect a small area of pulmonary contusion. Electronically Signed   By: Amie Portland M.D.   On: 07/23/2023 15:05   DG Pelvis Portable  Result Date: 07/23/2023 CLINICAL DATA:  Fall yesterday.  Pain.  Weakness. EXAM: PORTABLE PELVIS 1-2 VIEWS COMPARISON:  07/23/2020. FINDINGS: No fracture.  No bone lesion. Bilateral total hip arthroplasties appear well seated and well aligned. SI joints and symphysis pubis are normally spaced and aligned. Stable surgical vascular clips overlie the right ilium. IMPRESSION: 1. No fracture or acute finding. 2. Well aligned bilateral total hip arthroplasties. Electronically Signed   By: Amie Portland M.D.   On: 07/23/2023 13:39   DG Knee Right Port  Result Date: 07/23/2023 CLINICAL DATA:  Blunt  trauma. Several falls yesterday. Fell down approximately 17 steps this morning. EXAM: PORTABLE RIGHT KNEE - 1-2 VIEW COMPARISON:  Right knee radiographs 05/12/2023 FINDINGS: Severe medial acromial joint space narrowing again with bone-on-bone contact, subchondral sclerosis, and moderate peripheral osteophytosis. Moderate to severe patellofemoral joint space narrowing and peripheral osteophytosis, unchanged. Mild peripheral lateral compartment degenerative osteophytosis. No joint effusion. There are multiple linear lucencies overlying the posterior knee soft tissues and the proximal fibula on lateral view only from overlying clothing/sheet material. No acute fracture or dislocation. IMPRESSION: 1. No acute fracture. 2.  Severe medial and patellofemoral compartment osteoarthritis, unchanged. Electronically Signed   By: Neita Garnet M.D.   On: 07/23/2023 13:38   DG Chest Port 1 View  Result Date: 07/23/2023 CLINICAL DATA:  Pain after trauma EXAM: PORTABLE CHEST 1 VIEW COMPARISON:  Chest x-ray 08/23/2022 FINDINGS: Enlarged cardiopericardial silhouette. Tortuous ectatic aorta. No consolidation, pneumothorax or effusion. No edema. Dense right upper lung nodule stable consistent with old granulomatous disease and likely calcified. Overlapping cardiac leads. Degenerative changes of the spine. If there is further concern of the sequela of trauma additional contrast CT could be considered as clinically appropriate for further sensitivity of injury. IMPRESSION: Enlarged cardiopericardial silhouette with a tortuous aorta. Chronic lung changes. Electronically Signed   By: Karen Kays M.D.   On: 07/23/2023 13:33    Procedures Procedures    Medications Ordered in ED Medications  cefTRIAXone (ROCEPHIN) 1 g in sodium chloride 0.9 % 100 mL IVPB (has no administration in time range)  acetaminophen (TYLENOL) tablet 1,000 mg (1,000 mg Oral Patient Refused/Not Given 07/23/23 1758)  ondansetron (ZOFRAN) injection 4 mg (4  mg Intravenous Given 07/23/23 1414)  ketorolac (TORADOL) 15 MG/ML injection 15 mg (15 mg Intravenous Given 07/23/23 1510)  iohexol (OMNIPAQUE) 350 MG/ML injection 60 mL (60 mLs Intravenous Contrast Given 07/23/23 1443)  lactated ringers bolus 1,500 mL (1,500 mLs Intravenous New Bag/Given 07/23/23 1648)  oxyCODONE (Oxy IR/ROXICODONE) immediate release tablet 5 mg (5 mg Oral Given 07/23/23 1648)    ED Course/ Medical Decision Making/ A&P Clinical Course as of 07/23/23 1814  Fri Jul 23, 2023  1518 CT CHEST ABDOMEN PELVIS W CONTRAST Subtle hypoenhancement of the upper and lower polar right kidney, which may be artifactual or reflect pyelonephritis. Recommend correlation with urinalysis. [RP]  1520 Dr Derry Lory from neurology consulted. Recommends increasing her Keppra to 1000 mg BID.  [RP]  1531 Stable 62 YOF with a ccx of Fall Sz yesterday causing a fall down 17 steps.  ?pyelonephritis [CC]    Clinical Course User Index [CC] Glyn Ade, MD [RP] Rondel Baton, MD                                 Medical Decision Making Amount and/or Complexity of Data Reviewed Labs: ordered. Radiology: ordered. Decision-making details documented in ED Course.  Risk OTC drugs. Prescription drug management.   Valerie [name] is a 62 y.o. female with comorbidities that complicate the patient evaluation including seizures on Keppra and Lamictal, bipolar, TIA, and Parkinson's disease who presents to the emergency department after a fall.    Initial Ddx:  TBI, C-spine injury, rib fracture, hip fracture, knee fracture, seizure  MDM/Course:  Patient presents emergency department after several falls.  Did appear to have a seizure that caused 1 of these falls.  The remainder appear to be mechanical.  Does have multiple risk factors for falls including deconditioning and the fact that she has Parkinson's.  Appears to have been compliant with her seizure medication no other precipitating factor was  identified on history.  On exam, does have tenderness palpation of right hip and knee.  Given the fact that she fell down 17 stairs did obtain trauma pan scans.  No acute injuries were noted.  Did have a plain film of the right knee which does not show fracture.  No rhabdo on her lab work.  Patient is having persistent pain so CT will be obtained to rule out occult fracture.  The CT scan did show some enhancement of the kidneys that they are recommending correlation with urinalysis to evaluate for pyelonephritis.  She was signed out to the oncoming physician awaiting CT results and urinalysis.  Also was discussed with neurology who recommended increasing her Keppra to 1000 mg twice daily.  This patient presents to the ED for concern of complaints listed in HPI, this involves an extensive number of treatment options, and is a complaint that carries with it a high risk of complications and morbidity. Disposition including potential need for admission considered.   Dispo: Pending remainder of workup  Additional history obtained from daughter Records reviewed Outpatient Clinic Notes The following labs were independently interpreted: Chemistry and show CKD I independently reviewed the following imaging with scope of interpretation limited to determining acute life threatening conditions related to emergency care: CT Head and agree with the radiologist interpretation with the following exceptions: none I personally reviewed and interpreted cardiac monitoring: normal sinus rhythm  I personally reviewed and interpreted the pt's EKG: see above for interpretation  I have reviewed the patients home medications and made adjustments as needed Consults: Neurology  Portions of this note were generated with Dragon dictation software. Dictation errors may occur despite best attempts at proofreading.           Final Clinical Impression(s) / ED Diagnoses Final diagnoses:  Fall, initial encounter  Seizure  [facility])  Acute pain of right knee    Rx / DC Orders ED Discharge Orders     None         Rondel Baton, MD 07/23/23 1814

## 2023-07-23 NOTE — ED Triage Notes (Signed)
[facility] reports pt coming from home c/o several falls yesterday. Also states she had several hours of slurred speech yesterday after she had a seizure. Pt also c/o weakness. Pt also states she fell down app 17 steps this morning. Pt c/o pain all over from the fall, denies any blood thinners. Pt CAOx4 per EMS.

## 2023-07-24 DIAGNOSIS — N12 Tubulo-interstitial nephritis, not specified as acute or chronic: Secondary | ICD-10-CM | POA: Diagnosis not present

## 2023-07-24 LAB — BLOOD CULTURE ID PANEL (REFLEXED) - BCID2

## 2023-07-24 LAB — BASIC METABOLIC PANEL
Anion gap: 9 (ref 5–15)
BUN: 22 mg/dL (ref 8–23)
CO2: 21 mmol/L — ABNORMAL LOW (ref 22–32)
Calcium: 7.6 mg/dL — ABNORMAL LOW (ref 8.9–10.3)
Chloride: 106 mmol/L (ref 98–111)
Creatinine, Ser: 1.56 mg/dL — ABNORMAL HIGH (ref 0.44–1.00)
GFR, Estimated: 37 mL/min — ABNORMAL LOW (ref >60)
Glucose, Bld: 146 mg/dL — ABNORMAL HIGH (ref 70–99)
Potassium: 4.4 mmol/L (ref 3.5–5.1)
Sodium: 136 mmol/L (ref 135–145)

## 2023-07-24 LAB — CBC
HCT: 31.6 % — ABNORMAL LOW (ref 36.0–46.0)
Hemoglobin: 10.1 g/dL — ABNORMAL LOW (ref 12.0–15.0)
MCH: 32.1 pg (ref 26.0–34.0)
MCHC: 32 g/dL (ref 30.0–36.0)
MCV: 100.3 fL — ABNORMAL HIGH (ref 80.0–100.0)
Platelets: 138 10*3/uL — ABNORMAL LOW (ref 150–400)
RBC: 3.15 MIL/uL — ABNORMAL LOW (ref 3.87–5.11)
RDW: 14.2 % (ref 11.5–15.5)
WBC: 18.1 10*3/uL — ABNORMAL HIGH (ref 4.0–10.5)
nRBC: 0.1 % (ref 0.0–0.2)

## 2023-07-24 LAB — MAGNESIUM: Magnesium: 1.7 mg/dL (ref 1.7–2.4)

## 2023-07-24 LAB — PHOSPHORUS: Phosphorus: 3.5 mg/dL (ref 2.5–4.6)

## 2023-07-24 MED ORDER — DONEPEZIL HCL 10 MG PO TABS
10.0000 mg | ORAL_TABLET | Freq: Every day | ORAL | Status: DC
  Administered 2023-07-24 – 2023-07-28 (×5): 10 mg via ORAL
  Filled 2023-07-24 (×5): qty 1

## 2023-07-24 MED ORDER — POLYETHYLENE GLYCOL 3350 17 G PO PACK
17.0000 g | PACK | Freq: Every day | ORAL | Status: DC | PRN
  Administered 2023-07-27: 17 g via ORAL
  Filled 2023-07-24: qty 1

## 2023-07-24 MED ORDER — SODIUM CHLORIDE 0.9 % IV SOLN
2.0000 g | Freq: Every day | INTRAVENOUS | Status: DC
  Administered 2023-07-24 – 2023-07-26 (×3): 2 g via INTRAVENOUS
  Filled 2023-07-24 (×3): qty 20

## 2023-07-24 MED ORDER — MEMANTINE HCL 10 MG PO TABS
10.0000 mg | ORAL_TABLET | Freq: Two times a day (BID) | ORAL | Status: DC
  Administered 2023-07-24 – 2023-07-28 (×9): 10 mg via ORAL
  Filled 2023-07-24 (×9): qty 1

## 2023-07-24 MED ORDER — RIZATRIPTAN BENZOATE 10 MG PO TBDP: 10.0000 mg | ORAL_TABLET | ORAL | Status: DC

## 2023-07-24 MED ORDER — ROSUVASTATIN CALCIUM 5 MG PO TABS
10.0000 mg | ORAL_TABLET | Freq: Every day | ORAL | Status: DC
  Administered 2023-07-24 – 2023-07-28 (×5): 10 mg via ORAL
  Filled 2023-07-24 (×5): qty 2

## 2023-07-24 MED ORDER — PROCHLORPERAZINE EDISYLATE 10 MG/2ML IJ SOLN
5.0000 mg | Freq: Four times a day (QID) | INTRAMUSCULAR | Status: DC | PRN
  Administered 2023-07-27: 5 mg via INTRAVENOUS
  Filled 2023-07-24: qty 2

## 2023-07-24 MED ORDER — PRIMIDONE 50 MG PO TABS
100.0000 mg | ORAL_TABLET | Freq: Three times a day (TID) | ORAL | Status: DC
  Administered 2023-07-24 – 2023-07-28 (×14): 100 mg via ORAL
  Filled 2023-07-24 (×17): qty 2

## 2023-07-24 MED ORDER — LAMOTRIGINE 25 MG PO TABS
150.0000 mg | ORAL_TABLET | Freq: Two times a day (BID) | ORAL | Status: DC
  Administered 2023-07-24 – 2023-07-28 (×10): 150 mg via ORAL
  Filled 2023-07-24 (×3): qty 2
  Filled 2023-07-24: qty 1
  Filled 2023-07-24 (×7): qty 2

## 2023-07-24 MED ORDER — OXYCODONE HCL 5 MG PO TABS
5.0000 mg | ORAL_TABLET | Freq: Four times a day (QID) | ORAL | Status: DC | PRN
  Administered 2023-07-24: 5 mg via ORAL
  Filled 2023-07-24: qty 1

## 2023-07-24 MED ORDER — ALPRAZOLAM 0.25 MG PO TABS
0.2500 mg | ORAL_TABLET | Freq: Three times a day (TID) | ORAL | Status: DC | PRN
  Administered 2023-07-25 – 2023-07-26 (×3): 0.25 mg via ORAL
  Filled 2023-07-24 (×3): qty 1

## 2023-07-24 MED ORDER — CHLORHEXIDINE GLUCONATE CLOTH 2 % EX PADS
6.0000 | MEDICATED_PAD | Freq: Every day | CUTANEOUS | Status: DC
  Administered 2023-07-24 – 2023-07-27 (×4): 6 via TOPICAL

## 2023-07-24 MED ORDER — SUMATRIPTAN SUCCINATE 100 MG PO TABS
100.0000 mg | ORAL_TABLET | ORAL | Status: DC | PRN
  Administered 2023-07-24 – 2023-07-27 (×4): 100 mg via ORAL
  Filled 2023-07-24 (×5): qty 1

## 2023-07-24 MED ORDER — TRIAMCINOLONE ACETONIDE 0.1 % EX CREA
TOPICAL_CREAM | Freq: Two times a day (BID) | CUTANEOUS | Status: DC
  Filled 2023-07-24: qty 15

## 2023-07-24 MED ORDER — PANTOPRAZOLE SODIUM 40 MG PO TBEC
40.0000 mg | DELAYED_RELEASE_TABLET | Freq: Every day | ORAL | Status: DC
  Administered 2023-07-24 – 2023-07-28 (×5): 40 mg via ORAL
  Filled 2023-07-24 (×5): qty 1

## 2023-07-24 MED ORDER — HYDROCODONE-ACETAMINOPHEN 10-325 MG PO TABS
1.0000 | ORAL_TABLET | Freq: Four times a day (QID) | ORAL | Status: DC | PRN
  Administered 2023-07-26 – 2023-07-28 (×5): 1 via ORAL
  Filled 2023-07-24 (×5): qty 1

## 2023-07-24 MED ORDER — GABAPENTIN 100 MG PO CAPS
100.0000 mg | ORAL_CAPSULE | Freq: Every day | ORAL | Status: DC
  Administered 2023-07-24 – 2023-07-27 (×5): 100 mg via ORAL
  Filled 2023-07-24 (×5): qty 1

## 2023-07-24 MED ORDER — SODIUM CHLORIDE 0.9 % IV SOLN: INTRAVENOUS | Status: DC

## 2023-07-24 MED ORDER — LEVETIRACETAM 750 MG PO TABS
750.0000 mg | ORAL_TABLET | Freq: Two times a day (BID) | ORAL | Status: DC
  Administered 2023-07-24: 750 mg via ORAL
  Filled 2023-07-24: qty 1

## 2023-07-24 MED ORDER — ENOXAPARIN SODIUM 40 MG/0.4ML IJ SOSY
40.0000 mg | PREFILLED_SYRINGE | Freq: Every day | INTRAMUSCULAR | Status: DC
  Administered 2023-07-24 – 2023-07-28 (×5): 40 mg via SUBCUTANEOUS
  Filled 2023-07-24 (×5): qty 0.4

## 2023-07-24 MED ORDER — MULTI-VITAMIN/MINERALS PO TABS: 1.0000 | ORAL_TABLET | Freq: Every day | ORAL | Status: DC

## 2023-07-24 MED ORDER — OXYBUTYNIN CHLORIDE 5 MG PO TABS
5.0000 mg | ORAL_TABLET | Freq: Two times a day (BID) | ORAL | Status: DC
  Administered 2023-07-24 – 2023-07-25 (×3): 5 mg via ORAL
  Filled 2023-07-24 (×3): qty 1

## 2023-07-24 MED ORDER — FLUOXETINE HCL 20 MG PO CAPS
40.0000 mg | ORAL_CAPSULE | Freq: Every day | ORAL | Status: DC
  Administered 2023-07-24 – 2023-07-28 (×5): 40 mg via ORAL
  Filled 2023-07-24 (×5): qty 2

## 2023-07-24 MED ORDER — MELATONIN 5 MG PO TABS
5.0000 mg | ORAL_TABLET | Freq: Every evening | ORAL | Status: DC | PRN
  Administered 2023-07-25 – 2023-07-28 (×3): 5 mg via ORAL
  Filled 2023-07-24 (×3): qty 1

## 2023-07-24 MED ORDER — TRAZODONE HCL 100 MG PO TABS
200.0000 mg | ORAL_TABLET | Freq: Every day | ORAL | Status: DC
  Administered 2023-07-24 – 2023-07-27 (×4): 200 mg via ORAL
  Filled 2023-07-24 (×4): qty 2

## 2023-07-24 MED ORDER — PREDNISONE 5 MG PO TABS
5.0000 mg | ORAL_TABLET | Freq: Two times a day (BID) | ORAL | Status: DC
  Administered 2023-07-24 – 2023-07-28 (×9): 5 mg via ORAL
  Filled 2023-07-24 (×10): qty 1

## 2023-07-24 MED ORDER — CARBIDOPA-LEVODOPA 25-100 MG PO TABS
1.0000 | ORAL_TABLET | Freq: Three times a day (TID) | ORAL | Status: DC
  Administered 2023-07-24 – 2023-07-28 (×15): 1 via ORAL
  Filled 2023-07-24 (×15): qty 1

## 2023-07-24 MED ORDER — ACETAMINOPHEN 325 MG PO TABS
650.0000 mg | ORAL_TABLET | Freq: Four times a day (QID) | ORAL | Status: DC | PRN
  Administered 2023-07-24: 650 mg via ORAL
  Filled 2023-07-24: qty 2

## 2023-07-24 MED ORDER — LEVETIRACETAM 500 MG PO TABS
1000.0000 mg | ORAL_TABLET | Freq: Two times a day (BID) | ORAL | Status: DC
  Administered 2023-07-24 – 2023-07-28 (×9): 1000 mg via ORAL
  Filled 2023-07-24 (×9): qty 2

## 2023-07-24 NOTE — ED Notes (Signed)
ED TO INPATIENT HANDOFF REPORT  ED Nurse Name and Phone #: [phone]  S Name/Age/Gender Valerie [name] 62 y.o. female Room/Bed: 031C/031C  Code Status   Code Status: Prior  Home/SNF/Other Home Patient oriented to: self, place, time, and situation Is this baseline? Yes   Triage Complete: Triage complete  Chief Complaint Pyelonephritis [N12]  Triage Note [facility] reports pt coming from home c/o several falls yesterday. Also states she had several hours of slurred speech yesterday after she had a seizure. Pt also c/o weakness. Pt also states she fell down app 17 steps this morning. Pt c/o pain all over from the fall, denies any blood thinners. Pt CAOx4 per EMS.   Allergies Allergies  Allergen Reactions   Carbamazepine Other (See Comments)    Parkinsons like symptoms tremors   Sertraline Hcl Other (See Comments)    Unknown reaction    Level of Care/Admitting Diagnosis ED Disposition     ED Disposition  Admit   Condition  --   Comment  Hospital Area: [facility] [[id number]]  Level of Care: Telemetry Medical [104]  May admit patient to [facility] or [facility] if equivalent level of care is available:: Yes  Covid Evaluation: Asymptomatic - no recent exposure (last 10 days) testing not required  Diagnosis: Pyelonephritis [[id number]]  Admitting Physician: Darlin Drop [[id number]]  Attending Physician: Darlin Drop [[id number]]  Certification:: I certify this patient will need inpatient services for at least 2 midnights  Expected Medical Readiness: 07/25/2023          B Medical/Surgery History Past Medical History:  Diagnosis Date   Anemia    Anxiety    Arthritis    Psoriatic   Bipolar disorder (HCC)    CKD (chronic kidney disease), stage III (HCC)    Colovesical fistula    Depression    DM type 2 (diabetes mellitus, type 2) (HCC) 11/14/2019   Dyspnea    Headache    Neuropathy    NSTEMI (non-ST elevated myocardial infarction) (HCC) 11/14/2019    OSA (obstructive sleep apnea)    Psoriatic arthritis (HCC)    Sleep apnea    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   Stroke (HCC)    ?history; MRI normal   Past Surgical History:  Procedure Laterality Date   ABDOMINAL HYSTERECTOMY     APPENDECTOMY     CHOLECYSTECTOMY     CYSTOSCOPY WITH STENT PLACEMENT N/A 07/17/2020   Procedure: CYSTOSCOPY WITH BILATERAL FIREFLY INJECTION;  Surgeon: Heloise Purpura, MD;  Location: [facility];  Service: Urology;  Laterality: N/A;   FLEXIBLE SIGMOIDOSCOPY N/A 05/17/2020   Procedure: FLEXIBLE SIGMOIDOSCOPY;  Surgeon: Jeani Hawking, MD;  Location: [facility];  Service: Endoscopy;  Laterality: N/A;   FOOT SURGERY Right    IR FLUORO GUIDE CV LINE RIGHT  08/19/2020   IR RADIOLOGIST EVAL & MGMT  09/10/2020   IR RADIOLOGIST EVAL & MGMT  10/10/2020   IR REMOVAL TUN CV CATH W/O FL  09/16/2020   IR SINUS/FIST TUBE CHK-NON GI  10/24/2020   IR SINUS/FIST TUBE CHK-NON GI  10/31/2020   IR US GUIDE VASC ACCESS RIGHT  08/19/2020   LEFT HEART CATH AND CORONARY ANGIOGRAPHY N/A 11/14/2019   Procedure: LEFT HEART CATH AND CORONARY ANGIOGRAPHY;  Surgeon: Swaziland, Peter M, MD;  Location: [facility] INVASIVE CV LAB;  Service: Cardiovascular;  Laterality: N/A;   PARATHYROID EXPLORATION N/A 01/09/2021   Procedure: POSSIBLE NECK EXPLORATION;  Surgeon: Darnell Level, MD;  Location: [facility];  Service:  General;  Laterality: N/A;   PARATHYROIDECTOMY Left 01/09/2021   Procedure: LEFT INFERIOR PARATHYROIDECTOMY;  Surgeon: Darnell Level, MD;  Location: [facility];  Service: General;  Laterality: Left;   PROCTOSCOPY N/A 07/17/2020   Procedure: RIGID PROCTOSCOPY;  Surgeon: Karie Soda, MD;  Location: [facility];  Service: General;  Laterality: N/A;   TONSILLECTOMY     TOTAL HIP ARTHROPLASTY Bilateral      A IV Location/Drains/Wounds Patient Lines/Drains/Airways Status     Active Line/Drains/Airways     Name Placement date Placement time Site Days   Peripheral IV 07/23/23 20 G Distal;Left;Posterior  Forearm 07/23/23  1341  Forearm  1   Urethral Catheter chloe Y Double-lumen 16 Fr. 07/23/23  1903  Double-lumen  1   Incision - 5 Ports Abdomen Right;Upper Left;Upper Left;Mid Left;Lower;Lateral Left;Lower 07/17/20  1139  -- 1102            Intake/Output Last 24 hours  Intake/Output Summary (Last 24 hours) at 07/24/2023 0019 Last data filed at 07/24/2023 0018 Gross per 24 hour  Intake 1080 ml  Output --  Net 1080 ml    Labs/Imaging Results for orders placed or performed during the hospital encounter of 07/23/23 (from the past 48 hour(s))  Comprehensive metabolic panel     Status: Abnormal   Collection Time: 07/23/23  1:28 PM  Result Value Ref Range   Sodium 139 135 - 145 mmol/L   Potassium 3.7 3.5 - 5.1 mmol/L   Chloride 105 98 - 111 mmol/L   CO2 24 22 - 32 mmol/L   Glucose, Bld 151 (H) 70 - 99 mg/dL    Comment: Glucose reference range applies only to samples taken after fasting for at least 8 hours.   BUN 27 (H) 8 - 23 mg/dL   Creatinine, Ser 1.61 (H) 0.44 - 1.00 mg/dL   Calcium 8.3 (L) 8.9 - 10.3 mg/dL   Total Protein 5.2 (L) 6.5 - 8.1 g/dL   Albumin 3.0 (L) 3.5 - 5.0 g/dL   AST 16 15 - 41 U/L   ALT <5 0 - 44 U/L   Alkaline Phosphatase 76 38 - 126 U/L   Total Bilirubin 0.5 <1.2 mg/dL   GFR, Estimated 37 (L) >60 mL/min    Comment: (NOTE) Calculated using the CKD-EPI Creatinine Equation (2021)    Anion gap 10 5 - 15    Comment: Performed at [facility], [address] [place], [place] [address]  CBC     Status: Abnormal   Collection Time: 07/23/23  1:28 PM  Result Value Ref Range   WBC 16.4 (H) 4.0 - 10.5 K/uL   RBC 3.58 (L) 3.87 - 5.11 MIL/uL   Hemoglobin 11.7 (L) 12.0 - 15.0 g/dL   HCT 54.0 98.1 - 19.1 %   MCV 100.8 (H) 80.0 - 100.0 fL   MCH 32.7 26.0 - 34.0 pg   MCHC 32.4 30.0 - 36.0 g/dL   RDW 47.8 29.5 - 62.1 %   Platelets 173 150 - 400 K/uL   nRBC 0.0 0.0 - 0.2 %    Comment: Performed at [facility], [address], [place], [place]  [address]  Ethanol     Status: None   Collection Time: 07/23/23  1:28 PM  Result Value Ref Range   Alcohol, Ethyl (B) <10 <10 mg/dL    Comment: (NOTE) Lowest detectable limit for serum alcohol is 10 mg/dL.  For medical purposes only. Performed at [facility], [address] [place], [place] [address]   CK     Status: None   Collection Time: 07/23/23  1:28 PM  Result Value Ref Range   Total CK 71 38 - 234 U/L    Comment: Performed at [facility], [address] [place], [place] [address]  I-Stat Chem 8, ED     Status: Abnormal   Collection Time: 07/23/23  1:51 PM  Result Value Ref Range   Sodium 138 135 - 145 mmol/L   Potassium 4.2 3.5 - 5.1 mmol/L   Chloride 104 98 - 111 mmol/L   BUN 39 (H) 8 - 23 mg/dL   Creatinine, Ser 7.10 (H) 0.44 - 1.00 mg/dL   Glucose, Bld 626 (H) 70 - 99 mg/dL    Comment: Glucose reference range applies only to samples taken after fasting for at least 8 hours.   Calcium, Ion 1.07 (L) 1.15 - 1.40 mmol/L   TCO2 27 22 - 32 mmol/L   Hemoglobin 11.6 (L) 12.0 - 15.0 g/dL   HCT 94.8 (L) 54.6 - 27.0 %  I-Stat Lactic Acid, ED     Status: Abnormal   Collection Time: 07/23/23  1:51 PM  Result Value Ref Range   Lactic Acid, Venous 2.1 (HH) 0.5 - 1.9 mmol/L   Comment NOTIFIED PHYSICIAN   Urinalysis, Routine w reflex microscopic -Urine, Clean Catch     Status: Abnormal   Collection Time: 07/23/23  8:43 PM  Result Value Ref Range   Color, Urine YELLOW YELLOW   APPearance CLOUDY (A) CLEAR   Specific Gravity, Urine 1.029 1.005 - 1.030   pH 5.0 5.0 - 8.0   Glucose, UA NEGATIVE NEGATIVE mg/dL   Hgb urine dipstick MODERATE (A) NEGATIVE   Bilirubin Urine NEGATIVE NEGATIVE   Ketones, ur NEGATIVE NEGATIVE mg/dL   Protein, ur 350 (A) NEGATIVE mg/dL   Nitrite NEGATIVE NEGATIVE   Leukocytes,Ua LARGE (A) NEGATIVE   RBC / HPF 0-5 0 - 5 RBC/hpf   WBC, UA >50 0 - 5 WBC/hpf   Bacteria, UA RARE (A) NONE SEEN   Squamous Epithelial / HPF 0-5 0 - 5 /HPF   WBC  Clumps PRESENT    Mucus PRESENT     Comment: Performed at [facility], [address], [place], [place] [address]  Urine rapid drug screen (hosp performed)     Status: Abnormal   Collection Time: 07/23/23  8:43 PM  Result Value Ref Range   Opiates NONE DETECTED NONE DETECTED   Cocaine NONE DETECTED NONE DETECTED   Benzodiazepines NONE DETECTED NONE DETECTED   Amphetamines NONE DETECTED NONE DETECTED   Tetrahydrocannabinol NONE DETECTED NONE DETECTED   Barbiturates POSITIVE (A) NONE DETECTED    Comment: (NOTE) DRUG SCREEN FOR MEDICAL PURPOSES ONLY.  IF CONFIRMATION IS NEEDED FOR ANY PURPOSE, NOTIFY LAB WITHIN 5 DAYS.  LOWEST DETECTABLE LIMITS FOR URINE DRUG SCREEN Drug Class                     Cutoff (ng/mL) Amphetamine and metabolites    1000 Barbiturate and metabolites    200 Benzodiazepine                 200 Opiates and metabolites        300 Cocaine and metabolites        300 THC  50 Performed at [facility], [address] [place], [place] [address]    CT Knee Right Wo Contrast  Result Date: 07/23/2023 CLINICAL DATA:  Knee trauma, occult fracture suspected. Recent fall. EXAM: CT OF THE RIGHT KNEE WITHOUT CONTRAST TECHNIQUE: Multidetector CT imaging of the right knee was performed according to the standard protocol. Multiplanar CT image reconstructions were also generated. RADIATION DOSE REDUCTION: This exam was performed according to the departmental dose-optimization program which includes automated exposure control, adjustment of the mA and/or kV according to patient size and/or use of iterative reconstruction technique. COMPARISON:  Radiographs same date and 05/12/2023. FINDINGS: Bones/Joint/Cartilage The bones are demineralized. Age advanced tricompartmental degenerative changes are again noted, greatest within the medial compartment. There is no evidence of acute fracture, dislocation or osteonecrosis. There is a small nonspecific  knee joint effusion without lipohemarthrosis. Possible small loose bodies or fragmented osteophytes anteromedially. Ligaments Suboptimally assessed by CT. Muscles and Tendons Intact extensor mechanism. Mild semimembranosus muscle fatty atrophy. Soft tissues Mild subcutaneous edema anterolaterally. No focal fluid collection, foreign body or soft tissue emphysema. IMPRESSION: 1. No evidence of acute fracture or dislocation. 2. Age advanced tricompartmental degenerative changes, greatest within the medial compartment. 3. Small nonspecific knee joint effusion with possible small loose bodies or fragmented osteophytes anteromedially. Electronically Signed   By: Carey Bullocks M.D.   On: 07/23/2023 16:56   CT CHEST ABDOMEN PELVIS W CONTRAST  Result Date: 07/23/2023 CLINICAL DATA:  One day history of recurrent falls with diffuse pain. EXAM: CT CHEST, ABDOMEN, AND PELVIS WITH CONTRAST TECHNIQUE: Multidetector CT imaging of the chest, abdomen and pelvis was performed following the standard protocol during bolus administration of intravenous contrast. RADIATION DOSE REDUCTION: This exam was performed according to the departmental dose-optimization program which includes automated exposure control, adjustment of the mA and/or kV according to patient size and/or use of iterative reconstruction technique. CONTRAST:  60mL OMNIPAQUE IOHEXOL 350 MG/ML SOLN COMPARISON:  Same day chest radiograph, CT abdomen and pelvis dated 09/10/2020 FINDINGS: CT CHEST FINDINGS Cardiovascular: Normal heart size. No significant pericardial fluid/thickening. Great vessels are normal in course and caliber. No central pulmonary emboli. Mediastinum/Nodes: Imaged thyroid gland without nodules meeting criteria for imaging follow-up by size. Normal esophagus. No pathologically enlarged axillary, supraclavicular, mediastinal, or hilar lymph nodes. Lungs/Pleura: The central airways are patent. Subsegmental atelectasis/scarring in all lobes. No  pneumothorax. No pleural effusion. Musculoskeletal: Old posterolateral right fourth and anterior right fourth and left fourth through seventh rib fractures. Multilevel degenerative changes of the thoracic spine. Age indeterminate superior endplate compression of T9 and T11. CT ABDOMEN PELVIS FINDINGS Hepatobiliary: No focal hepatic lesions. No intra or extrahepatic biliary ductal dilation. Cholecystectomy. Pancreas: No focal lesions or main ductal dilation. Spleen: Normal in size without focal abnormality. Adrenals/Urinary Tract: No adrenal nodules. Subtle hypoenhancement of the upper and lower polar right kidney (6:15, 25). No suspicious renal mass, calculi, or hydronephrosis. Suboptimal evaluation of the bladder due to beam hardening artifact from hip arthroplasties. Stomach/Bowel: Normal appearance of the stomach. Postsurgical changes of the sigmoid colon. Anastomosis is patent. Slightly tethered appearance of the left hemi abdominal bowel, likely postsurgical. No evidence of bowel wall thickening, distention, or inflammatory changes. Ovoid focus of intraluminal fat attenuation within left lower quadrant small bowel (3:74) may represent an intraluminal lipoma. Colonic diverticulosis without acute diverticulitis. Appendectomy. Vascular/Lymphatic: Aortic atherosclerosis. No enlarged abdominal or pelvic lymph nodes. Reproductive: No adnexal masses. Other: No free fluid, fluid collection, or free air. Musculoskeletal: No acute or abnormal lytic  or blastic osseous findings. Multilevel degenerative changes of the lumbar spine. Old right inferior pubic ramus fracture. Bilateral hip arthroplasties. Postsurgical changes of the anterior abdominal wall. Subcutaneous soft tissue stranding along the posterior right upper quadrant and posterior left lower quadrant. Anterior left lower quadrant soft tissue stranding is likely postprocedural. IMPRESSION: 1. Subtle hypoenhancement of the upper and lower polar right kidney, which  may be artifactual or reflect pyelonephritis. Recommend correlation with urinalysis. 2. Subcutaneous soft tissue stranding along the posterior right upper quadrant and posterior left lower quadrant, likely posttraumatic. 3. No acute traumatic injury in the chest. 4.  Aortic Atherosclerosis (ICD10-I70.0). Electronically Signed   By: Agustin Cree M.D.   On: 07/23/2023 15:15   CT HEAD WO CONTRAST  Result Date: 07/23/2023 CLINICAL DATA:  Fall.  Head trauma. EXAM: CT HEAD WITHOUT CONTRAST CT CERVICAL SPINE WITHOUT CONTRAST TECHNIQUE: Multidetector CT imaging of the head and cervical spine was performed following the standard protocol without intravenous contrast. Multiplanar CT image reconstructions of the cervical spine were also generated. RADIATION DOSE REDUCTION: This exam was performed according to the departmental dose-optimization program which includes automated exposure control, adjustment of the mA and/or kV according to patient size and/or use of iterative reconstruction technique. COMPARISON:  08/23/2022 and 11/27/2021. FINDINGS: CT HEAD FINDINGS Brain: No evidence of acute infarction, hemorrhage, hydrocephalus, extra-axial collection or mass lesion/mass effect. Vascular: No hyperdense vessel or unexpected calcification. Skull: Normal. Negative for fracture or focal lesion. Sinuses/Orbits: Globes and orbits are unremarkable. Sinuses are clear. Other: None. CT CERVICAL SPINE FINDINGS Alignment: Mild kyphosis, apex at C4-C5.  No spondylolisthesis. Skull base and vertebrae: No acute fracture. No primary bone lesion or focal pathologic process. Soft tissues and spinal canal: No prevertebral fluid or swelling. No visible canal hematoma. Disc levels: Mild loss of disc height at C4-C5. Moderate loss of disc height at C5-C6 and C6-C7. Mild disc bulging and endplate spurring noted at these levels. No disc herniation. No significant stenosis. Upper chest: Mild ground-glass opacity in the medial left upper lobe.  Stable linear scarring in the left upper lobe near the apex. Other: None. IMPRESSION: CT HEAD: No acute intracranial abnormalities. CT CERVICAL SPINE: 1. No fracture or acute finding. 2. Mild ground-glass opacity in the medial left upper lobe, nonspecific but could reflect a small area of pulmonary contusion. Electronically Signed   By: Amie Portland M.D.   On: 07/23/2023 15:05   CT CERVICAL SPINE WO CONTRAST  Result Date: 07/23/2023 CLINICAL DATA:  Fall.  Head trauma. EXAM: CT HEAD WITHOUT CONTRAST CT CERVICAL SPINE WITHOUT CONTRAST TECHNIQUE: Multidetector CT imaging of the head and cervical spine was performed following the standard protocol without intravenous contrast. Multiplanar CT image reconstructions of the cervical spine were also generated. RADIATION DOSE REDUCTION: This exam was performed according to the departmental dose-optimization program which includes automated exposure control, adjustment of the mA and/or kV according to patient size and/or use of iterative reconstruction technique. COMPARISON:  08/23/2022 and 11/27/2021. FINDINGS: CT HEAD FINDINGS Brain: No evidence of acute infarction, hemorrhage, hydrocephalus, extra-axial collection or mass lesion/mass effect. Vascular: No hyperdense vessel or unexpected calcification. Skull: Normal. Negative for fracture or focal lesion. Sinuses/Orbits: Globes and orbits are unremarkable. Sinuses are clear. Other: None. CT CERVICAL SPINE FINDINGS Alignment: Mild kyphosis, apex at C4-C5.  No spondylolisthesis. Skull base and vertebrae: No acute fracture. No primary bone lesion or focal pathologic process. Soft tissues and spinal canal: No prevertebral fluid or swelling. No visible canal hematoma. Disc levels: Mild loss of  disc height at C4-C5. Moderate loss of disc height at C5-C6 and C6-C7. Mild disc bulging and endplate spurring noted at these levels. No disc herniation. No significant stenosis. Upper chest: Mild ground-glass opacity in the medial  left upper lobe. Stable linear scarring in the left upper lobe near the apex. Other: None. IMPRESSION: CT HEAD: No acute intracranial abnormalities. CT CERVICAL SPINE: 1. No fracture or acute finding. 2. Mild ground-glass opacity in the medial left upper lobe, nonspecific but could reflect a small area of pulmonary contusion. Electronically Signed   By: Amie Portland M.D.   On: 07/23/2023 15:05   DG Pelvis Portable  Result Date: 07/23/2023 CLINICAL DATA:  Fall yesterday.  Pain.  Weakness. EXAM: PORTABLE PELVIS 1-2 VIEWS COMPARISON:  07/23/2020. FINDINGS: No fracture.  No bone lesion. Bilateral total hip arthroplasties appear well seated and well aligned. SI joints and symphysis pubis are normally spaced and aligned. Stable surgical vascular clips overlie the right ilium. IMPRESSION: 1. No fracture or acute finding. 2. Well aligned bilateral total hip arthroplasties. Electronically Signed   By: Amie Portland M.D.   On: 07/23/2023 13:39   DG Knee Right Port  Result Date: 07/23/2023 CLINICAL DATA:  Blunt trauma. Several falls yesterday. Fell down approximately 17 steps this morning. EXAM: PORTABLE RIGHT KNEE - 1-2 VIEW COMPARISON:  Right knee radiographs 05/12/2023 FINDINGS: Severe medial acromial joint space narrowing again with bone-on-bone contact, subchondral sclerosis, and moderate peripheral osteophytosis. Moderate to severe patellofemoral joint space narrowing and peripheral osteophytosis, unchanged. Mild peripheral lateral compartment degenerative osteophytosis. No joint effusion. There are multiple linear lucencies overlying the posterior knee soft tissues and the proximal fibula on lateral view only from overlying clothing/sheet material. No acute fracture or dislocation. IMPRESSION: 1. No acute fracture. 2. Severe medial and patellofemoral compartment osteoarthritis, unchanged. Electronically Signed   By: Neita Garnet M.D.   On: 07/23/2023 13:38   DG Chest Port 1 View  Result Date:  07/23/2023 CLINICAL DATA:  Pain after trauma EXAM: PORTABLE CHEST 1 VIEW COMPARISON:  Chest x-ray 08/23/2022 FINDINGS: Enlarged cardiopericardial silhouette. Tortuous ectatic aorta. No consolidation, pneumothorax or effusion. No edema. Dense right upper lung nodule stable consistent with old granulomatous disease and likely calcified. Overlapping cardiac leads. Degenerative changes of the spine. If there is further concern of the sequela of trauma additional contrast CT could be considered as clinically appropriate for further sensitivity of injury. IMPRESSION: Enlarged cardiopericardial silhouette with a tortuous aorta. Chronic lung changes. Electronically Signed   By: Karen Kays M.D.   On: 07/23/2023 13:33    Pending Labs Unresulted Labs (From admission, onward)     Start     Ordered   07/23/23 1743  Blood culture (routine x 2)  BLOOD CULTURE X 2,   R      07/23/23 1742            Vitals/Pain Today's Vitals   07/23/23 2115 07/23/23 2145 07/24/23 0015 07/24/23 0017  BP: (!) 102/32 (!) 98/42 (!) 136/106 (!) 136/106  Pulse: 93 95 88 87  Resp: (!) 35 (!) 35  (!) 22  Temp:    99.7 F (37.6 C)  TempSrc:    Oral  SpO2: (!) 89%  93% 94%  PainSc:        Isolation Precautions No active isolations  Medications Medications  acetaminophen (TYLENOL) tablet 1,000 mg (1,000 mg Oral Patient Refused/Not Given 07/23/23 1758)  ondansetron (ZOFRAN) injection 4 mg (4 mg Intravenous Given 07/23/23 1414)  ketorolac (TORADOL) 15 MG/ML  injection 15 mg (15 mg Intravenous Given 07/23/23 1510)  iohexol (OMNIPAQUE) 350 MG/ML injection 60 mL (60 mLs Intravenous Contrast Given 07/23/23 1443)  lactated ringers bolus 1,500 mL (0 mLs Intravenous Stopped 07/23/23 1844)  oxyCODONE (Oxy IR/ROXICODONE) immediate release tablet 5 mg (5 mg Oral Given 07/23/23 1648)  cefTRIAXone (ROCEPHIN) 1 g in sodium chloride 0.9 % 100 mL IVPB (0 g Intravenous Stopped 07/23/23 1921)  lactated ringers bolus 1,000 mL (0 mLs  Intravenous Stopped 07/24/23 0018)    Mobility walks with device     Focused Assessments    R Recommendations: See Admitting Provider Note  Report given to:   Additional Notes: pt a/ox4

## 2023-07-24 NOTE — Progress Notes (Signed)
PHARMACY - PHYSICIAN COMMUNICATION CRITICAL VALUE ALERT - BLOOD CULTURE IDENTIFICATION (BCID)  Valerie [name] is an 62 y.o. female who presented to [facility] on 07/23/2023 with a chief complaint of pyelonephritis  Assessment:  Blood cultures growing E. coli  Name of physician (or Provider) Contacted:  Dr. Jerral Ralph  Current antibiotics:  Rocephin 2 g IV q24h  Changes to prescribed antibiotics recommended:  No changes needed  Results for orders placed or performed during the hospital encounter of 07/23/23  Blood Culture ID Panel (Reflexed) (Collected: 07/23/2023  6:22 PM)  Result Value Ref Range   Enterococcus faecalis NOT DETECTED NOT DETECTED   Enterococcus Faecium NOT DETECTED NOT DETECTED   Listeria monocytogenes NOT DETECTED NOT DETECTED   Staphylococcus species NOT DETECTED NOT DETECTED   Staphylococcus aureus (BCID) NOT DETECTED NOT DETECTED   Staphylococcus epidermidis NOT DETECTED NOT DETECTED   Staphylococcus lugdunensis NOT DETECTED NOT DETECTED   Streptococcus species NOT DETECTED NOT DETECTED   Streptococcus agalactiae NOT DETECTED NOT DETECTED   Streptococcus pneumoniae NOT DETECTED NOT DETECTED   Streptococcus pyogenes NOT DETECTED NOT DETECTED   A.calcoaceticus-baumannii NOT DETECTED NOT DETECTED   Bacteroides fragilis NOT DETECTED NOT DETECTED   Enterobacterales DETECTED (A) NOT DETECTED   Enterobacter cloacae complex NOT DETECTED NOT DETECTED   Escherichia coli DETECTED (A) NOT DETECTED   Klebsiella aerogenes NOT DETECTED NOT DETECTED   Klebsiella oxytoca NOT DETECTED NOT DETECTED   Klebsiella pneumoniae NOT DETECTED NOT DETECTED   Proteus species NOT DETECTED NOT DETECTED   Salmonella species NOT DETECTED NOT DETECTED   Serratia marcescens NOT DETECTED NOT DETECTED   Haemophilus influenzae NOT DETECTED NOT DETECTED   Neisseria meningitidis NOT DETECTED NOT DETECTED   Pseudomonas aeruginosa NOT DETECTED NOT DETECTED   Stenotrophomonas maltophilia NOT  DETECTED NOT DETECTED   Candida albicans NOT DETECTED NOT DETECTED   Candida auris NOT DETECTED NOT DETECTED   Candida glabrata NOT DETECTED NOT DETECTED   Candida krusei NOT DETECTED NOT DETECTED   Candida parapsilosis NOT DETECTED NOT DETECTED   Candida tropicalis NOT DETECTED NOT DETECTED   Cryptococcus neoformans/gattii NOT DETECTED NOT DETECTED   CTX-M ESBL NOT DETECTED NOT DETECTED   Carbapenem resistance IMP NOT DETECTED NOT DETECTED   Carbapenem resistance KPC NOT DETECTED NOT DETECTED   Carbapenem resistance NDM NOT DETECTED NOT DETECTED   Carbapenem resist OXA 48 LIKE NOT DETECTED NOT DETECTED   Carbapenem resistance VIM NOT DETECTED NOT DETECTED    Eddie Candle 07/24/2023  7:21 AM

## 2023-07-24 NOTE — H&P (Addendum)
History and Physical  Valerie [name] ZOX:[id number] DOB: July 25, 1961 DOA: 07/23/2023  Referring physician: Dr. Doran Durand, EDP  PCP: Myrlene Broker, MD  Outpatient Specialists: None Patient coming from: Home  Chief Complaint: Altered mental status, fall.  HPI: Valerie [name] is a 62 y.o. female with medical history significant for seizure disorder, bipolar disorder, TIA, Parkinson's disease, chronic urinary retention, CKD 3B, who presents to the ED after a fall.  Endorses being compliant with her seizure medications.  Reports right flank pain for the past few days.  In the ED, EDP discussed the case with neurology regarding her seizure disorder recommended to increase home Keppra dose to 1000 twice daily.  The patient was noted to be febrile in the ED with Tmax 102.6.  Foley catheter placed due to urinary retention.  UA was positive for pyuria.  The patient was started on empiric IV antibiotics.  Admitted by [facility], hospitalist service.  ED Course: Temperature 102.6.  BP 92/54, pulse 84, respiration 18, O2 saturation 92% on room air.  Lab studies notable for WBC 18.1, hemoglobin 10.1, platelet count 138, serum bicarb 21, glucose 146, creatinine 1.56 with GFR of 37.  Review of Systems: Review of systems as noted in the HPI. All other systems reviewed and are negative.   Past Medical History:  Diagnosis Date   Anemia    Anxiety    Arthritis    Psoriatic   Bipolar disorder (HCC)    CKD (chronic kidney disease), stage III (HCC)    Colovesical fistula    Depression    DM type 2 (diabetes mellitus, type 2) (HCC) 11/14/2019   Dyspnea    Headache    Neuropathy    NSTEMI (non-ST elevated myocardial infarction) (HCC) 11/14/2019   OSA (obstructive sleep apnea)    Psoriatic arthritis (HCC)    Sleep apnea    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   Stroke (HCC)    ?history; MRI normal   Past Surgical History:  Procedure Laterality Date   ABDOMINAL HYSTERECTOMY     APPENDECTOMY      CHOLECYSTECTOMY     CYSTOSCOPY WITH STENT PLACEMENT N/A 07/17/2020   Procedure: CYSTOSCOPY WITH BILATERAL FIREFLY INJECTION;  Surgeon: Heloise Purpura, MD;  Location: [facility];  Service: Urology;  Laterality: N/A;   FLEXIBLE SIGMOIDOSCOPY N/A 05/17/2020   Procedure: FLEXIBLE SIGMOIDOSCOPY;  Surgeon: Jeani Hawking, MD;  Location: [facility];  Service: Endoscopy;  Laterality: N/A;   FOOT SURGERY Right    IR FLUORO GUIDE CV LINE RIGHT  08/19/2020   IR RADIOLOGIST EVAL & MGMT  09/10/2020   IR RADIOLOGIST EVAL & MGMT  10/10/2020   IR REMOVAL TUN CV CATH W/O FL  09/16/2020   IR SINUS/FIST TUBE CHK-NON GI  10/24/2020   IR SINUS/FIST TUBE CHK-NON GI  10/31/2020   IR US GUIDE VASC ACCESS RIGHT  08/19/2020   LEFT HEART CATH AND CORONARY ANGIOGRAPHY N/A 11/14/2019   Procedure: LEFT HEART CATH AND CORONARY ANGIOGRAPHY;  Surgeon: [place], Peter M, MD;  Location: [facility] INVASIVE CV LAB;  Service: Cardiovascular;  Laterality: N/A;   PARATHYROID EXPLORATION N/A 01/09/2021   Procedure: POSSIBLE NECK EXPLORATION;  Surgeon: Darnell Level, MD;  Location: [facility];  Service: General;  Laterality: N/A;   PARATHYROIDECTOMY Left 01/09/2021   Procedure: LEFT INFERIOR PARATHYROIDECTOMY;  Surgeon: Darnell Level, MD;  Location: [facility];  Service: General;  Laterality: Left;   PROCTOSCOPY N/A 07/17/2020   Procedure: RIGID PROCTOSCOPY;  Surgeon: Karie Soda, MD;  Location: [facility];  Service:  General;  Laterality: N/A;   TONSILLECTOMY     TOTAL HIP ARTHROPLASTY Bilateral     Social History:  reports that she has been smoking cigarettes. She has been exposed to tobacco smoke. She has never used smokeless tobacco. She reports current alcohol use. She reports that she does not use drugs.   Allergies  Allergen Reactions   Carbamazepine Other (See Comments)    Parkinsons like symptoms tremors   Sertraline Hcl Other (See Comments)    Unknown reaction    Family History  Problem Relation Age of Onset   Breast cancer Neg Hx        Prior to Admission medications   Medication Sig Start Date End Date Taking? Authorizing Provider  ALPRAZolam Prudy Feeler) 0.5 MG tablet Take 0.25 mg by mouth 3 (three) times daily as needed for anxiety. 03/20/22  Yes [provider]  betamethasone valerate ointment (VALISONE) 0.1 % Apply 1 Application topically 2 (two) times daily. 06/14/23  Yes Myrlene Broker, MD  carbidopa-levodopa (SINEMET IR) 25-100 MG tablet Take 1 tablet by mouth in the morning and at bedtime. 08/27/22  Yes [provider]  docusate sodium (COLACE) 100 MG capsule Take 200 mg by mouth at bedtime.   Yes [provider]  donepezil (ARICEPT) 10 MG tablet Take 10 mg by mouth daily. 10/30/19  Yes [provider]  FLUoxetine (PROZAC) 40 MG capsule Take 40 mg by mouth 2 (two) times daily. 04/09/22  Yes [provider]  gabapentin (NEURONTIN) 300 MG capsule Take 600 mg by mouth at bedtime. 06/18/23  Yes [provider]  HYDROcodone-acetaminophen (NORCO) 10-325 MG tablet Take 0.5-1 tablets by mouth every 6 (six) hours as needed for moderate pain or severe pain. Patient taking differently: Take 1 tablet by mouth every 6 (six) hours as needed for moderate pain (pain score 4-6) or severe pain (pain score 7-10). 12/04/22  Yes Myrlene Broker, MD  ibuprofen (ADVIL) 200 MG tablet Take 800 mg by mouth as needed.   Yes [provider]  lamoTRIgine (LAMICTAL) 150 MG tablet Take 1 tablet by mouth 2 (two) times daily. 12/28/22  Yes [provider]  levETIRAcetam (KEPPRA) 750 MG tablet Take 750 mg by mouth 2 (two) times daily. 08/26/22  Yes [provider]  levocetirizine (XYZAL) 5 MG tablet Take 1 tablet (5 mg total) by mouth every evening. 05/03/23  Yes [name] F, FNP  meclizine (ANTIVERT) 25 MG tablet Take 1 tablet (25 mg total) by mouth 3 (three) times daily as needed for dizziness. 01/11/23  Yes [name] F, FNP  memantine (NAMENDA) 10 MG tablet Take 10  mg by mouth 2 (two) times daily. 03/18/22  Yes [provider]  Multiple Vitamins-Minerals (MULTIVITAMIN WITH MINERALS) tablet Take 1 tablet by mouth daily.   Yes [provider]  nitroGLYCERIN (NITROSTAT) 0.4 MG SL tablet Place 1 tablet (0.4 mg total) under the tongue every 5 (five) minutes as needed for chest pain. 05/03/23  Yes Gwenlyn Fudge, FNP  Omega-3 Fatty Acids (FISH OIL PO) Take by mouth.   Yes [provider]  omeprazole (PRILOSEC) 40 MG capsule Take 1 capsule (40 mg total) by mouth 2 (two) times daily before a meal. Patient taking differently: Take 40 mg by mouth daily. 10/21/22  Yes Myrlene Broker, MD  oxybutynin (DITROPAN) 5 MG tablet Take 1 tablet (5 mg total) by mouth 2 (two) times daily. 05/03/23  Yes Gwenlyn Fudge, FNP  predniSONE (DELTASONE) 5 MG tablet  Take 1 tablet (5 mg total) by mouth daily with breakfast. Patient taking differently: Take 5 mg by mouth 2 (two) times daily with a meal. 02/10/23  Yes Myrlene Broker, MD  primidone (MYSOLINE) 50 MG tablet Take 100 mg by mouth 3 (three) times daily. 04/09/22  Yes [provider]  Probiotic Product (PROBIOTIC DAILY PO) Take 1 tablet by mouth daily.   Yes [provider]  rizatriptan (MAXALT-MLT) 10 MG disintegrating tablet Take 10 mg by mouth See admin instructions. Take one tablet (10 mg) by mouth daily as needed for migraine. May repeat in 2 hours if still needed   Yes [provider]  rosuvastatin (CRESTOR) 10 MG tablet Take 1 tablet (10 mg total) by mouth daily. 12/04/22  Yes Myrlene Broker, MD  traZODone (DESYREL) 100 MG tablet Take 2 tablets (200 mg total) by mouth at bedtime. 07/23/20  Yes Sherrie George, PA-C    Physical Exam: BP (!) 92/54 (BP Location: Right Arm)   Pulse 84   Temp (!) 102.6 F (39.2 C) (Oral)   Resp 18   Ht 5\' 2"  (1.575 m)   Wt 90 kg   SpO2 92%   BMI 36.29 kg/m   General: 62 y.o. year-old female well developed well  nourished in no acute distress.  Alert and interactive. Cardiovascular: Regular rate and rhythm with no rubs or gallops.  No thyromegaly or JVD noted.  No lower extremity edema. 2/4 pulses in all 4 extremities. Respiratory: Clear to auscultation with no wheezes or rales. Good inspiratory effort. Abdomen: Soft nontender nondistended with normal bowel sounds x4 quadrants. Muskuloskeletal: No cyanosis, clubbing or edema noted bilaterally Neuro: CN II-XII intact, strength, sensation, reflexes Skin: No ulcerative lesions noted or rashes Psychiatry: Judgement and insight appear normal. Mood is appropriate for condition and setting          Labs on Admission:  Basic Metabolic Panel: Recent Labs  Lab 07/23/23 1328 07/23/23 1351  NA 139 138  K 3.7 4.2  CL 105 104  CO2 24  --   GLUCOSE 151* 147*  BUN 27* 39*  CREATININE 1.57* 1.50*  CALCIUM 8.3*  --    Liver Function Tests: Recent Labs  Lab 07/23/23 1328  AST 16  ALT <5  ALKPHOS 76  BILITOT 0.5  PROT 5.2*  ALBUMIN 3.0*   No results for input(s): "LIPASE", "AMYLASE" in the last 168 hours. No results for input(s): "AMMONIA" in the last 168 hours. CBC: Recent Labs  Lab 07/23/23 1328 07/23/23 1351  WBC 16.4*  --   HGB 11.7* 11.6*  HCT 36.1 34.0*  MCV 100.8*  --   PLT 173  --    Cardiac Enzymes: Recent Labs  Lab 07/23/23 1328  CKTOTAL 71    BNP (last 3 results) No results for input(s): "BNP" in the last 8760 hours.  ProBNP (last 3 results) No results for input(s): "PROBNP" in the last 8760 hours.  CBG: No results for input(s): "GLUCAP" in the last 168 hours.  Radiological Exams on Admission: CT Knee Right Wo Contrast  Result Date: 07/23/2023 CLINICAL DATA:  Knee trauma, occult fracture suspected. Recent fall. EXAM: CT OF THE RIGHT KNEE WITHOUT CONTRAST TECHNIQUE: Multidetector CT imaging of the right knee was performed according to the standard protocol. Multiplanar CT image reconstructions were also generated.  RADIATION DOSE REDUCTION: This exam was performed according to the departmental dose-optimization program which includes automated exposure control, adjustment of the mA and/or kV according to patient size and/or use  of iterative reconstruction technique. COMPARISON:  Radiographs same date and 05/12/2023. FINDINGS: Bones/Joint/Cartilage The bones are demineralized. Age advanced tricompartmental degenerative changes are again noted, greatest within the medial compartment. There is no evidence of acute fracture, dislocation or osteonecrosis. There is a small nonspecific knee joint effusion without lipohemarthrosis. Possible small loose bodies or fragmented osteophytes anteromedially. Ligaments Suboptimally assessed by CT. Muscles and Tendons Intact extensor mechanism. Mild semimembranosus muscle fatty atrophy. Soft tissues Mild subcutaneous edema anterolaterally. No focal fluid collection, foreign body or soft tissue emphysema. IMPRESSION: 1. No evidence of acute fracture or dislocation. 2. Age advanced tricompartmental degenerative changes, greatest within the medial compartment. 3. Small nonspecific knee joint effusion with possible small loose bodies or fragmented osteophytes anteromedially. Electronically Signed   By: Carey Bullocks M.D.   On: 07/23/2023 16:56   CT CHEST ABDOMEN PELVIS W CONTRAST  Result Date: 07/23/2023 CLINICAL DATA:  One day history of recurrent falls with diffuse pain. EXAM: CT CHEST, ABDOMEN, AND PELVIS WITH CONTRAST TECHNIQUE: Multidetector CT imaging of the chest, abdomen and pelvis was performed following the standard protocol during bolus administration of intravenous contrast. RADIATION DOSE REDUCTION: This exam was performed according to the departmental dose-optimization program which includes automated exposure control, adjustment of the mA and/or kV according to patient size and/or use of iterative reconstruction technique. CONTRAST:  60mL OMNIPAQUE IOHEXOL 350 MG/ML SOLN  COMPARISON:  Same day chest radiograph, CT abdomen and pelvis dated 09/10/2020 FINDINGS: CT CHEST FINDINGS Cardiovascular: Normal heart size. No significant pericardial fluid/thickening. Great vessels are normal in course and caliber. No central pulmonary emboli. Mediastinum/Nodes: Imaged thyroid gland without nodules meeting criteria for imaging follow-up by size. Normal esophagus. No pathologically enlarged axillary, supraclavicular, mediastinal, or hilar lymph nodes. Lungs/Pleura: The central airways are patent. Subsegmental atelectasis/scarring in all lobes. No pneumothorax. No pleural effusion. Musculoskeletal: Old posterolateral right fourth and anterior right fourth and left fourth through seventh rib fractures. Multilevel degenerative changes of the thoracic spine. Age indeterminate superior endplate compression of T9 and T11. CT ABDOMEN PELVIS FINDINGS Hepatobiliary: No focal hepatic lesions. No intra or extrahepatic biliary ductal dilation. Cholecystectomy. Pancreas: No focal lesions or main ductal dilation. Spleen: Normal in size without focal abnormality. Adrenals/Urinary Tract: No adrenal nodules. Subtle hypoenhancement of the upper and lower polar right kidney (6:15, 25). No suspicious renal mass, calculi, or hydronephrosis. Suboptimal evaluation of the bladder due to beam hardening artifact from hip arthroplasties. Stomach/Bowel: Normal appearance of the stomach. Postsurgical changes of the sigmoid colon. Anastomosis is patent. Slightly tethered appearance of the left hemi abdominal bowel, likely postsurgical. No evidence of bowel wall thickening, distention, or inflammatory changes. Ovoid focus of intraluminal fat attenuation within left lower quadrant small bowel (3:74) may represent an intraluminal lipoma. Colonic diverticulosis without acute diverticulitis. Appendectomy. Vascular/Lymphatic: Aortic atherosclerosis. No enlarged abdominal or pelvic lymph nodes. Reproductive: No adnexal masses. Other:  No free fluid, fluid collection, or free air. Musculoskeletal: No acute or abnormal lytic or blastic osseous findings. Multilevel degenerative changes of the lumbar spine. Old right inferior pubic ramus fracture. Bilateral hip arthroplasties. Postsurgical changes of the anterior abdominal wall. Subcutaneous soft tissue stranding along the posterior right upper quadrant and posterior left lower quadrant. Anterior left lower quadrant soft tissue stranding is likely postprocedural. IMPRESSION: 1. Subtle hypoenhancement of the upper and lower polar right kidney, which may be artifactual or reflect pyelonephritis. Recommend correlation with urinalysis. 2. Subcutaneous soft tissue stranding along the posterior right upper quadrant and posterior left lower quadrant, likely posttraumatic. 3. No acute  traumatic injury in the chest. 4.  Aortic Atherosclerosis (ICD10-I70.0). Electronically Signed   By: Agustin Cree M.D.   On: 07/23/2023 15:15   CT HEAD WO CONTRAST  Result Date: 07/23/2023 CLINICAL DATA:  Fall.  Head trauma. EXAM: CT HEAD WITHOUT CONTRAST CT CERVICAL SPINE WITHOUT CONTRAST TECHNIQUE: Multidetector CT imaging of the head and cervical spine was performed following the standard protocol without intravenous contrast. Multiplanar CT image reconstructions of the cervical spine were also generated. RADIATION DOSE REDUCTION: This exam was performed according to the departmental dose-optimization program which includes automated exposure control, adjustment of the mA and/or kV according to patient size and/or use of iterative reconstruction technique. COMPARISON:  08/23/2022 and 11/27/2021. FINDINGS: CT HEAD FINDINGS Brain: No evidence of acute infarction, hemorrhage, hydrocephalus, extra-axial collection or mass lesion/mass effect. Vascular: No hyperdense vessel or unexpected calcification. Skull: Normal. Negative for fracture or focal lesion. Sinuses/Orbits: Globes and orbits are unremarkable. Sinuses are clear.  Other: None. CT CERVICAL SPINE FINDINGS Alignment: Mild kyphosis, apex at C4-C5.  No spondylolisthesis. Skull base and vertebrae: No acute fracture. No primary bone lesion or focal pathologic process. Soft tissues and spinal canal: No prevertebral fluid or swelling. No visible canal hematoma. Disc levels: Mild loss of disc height at C4-C5. Moderate loss of disc height at C5-C6 and C6-C7. Mild disc bulging and endplate spurring noted at these levels. No disc herniation. No significant stenosis. Upper chest: Mild ground-glass opacity in the medial left upper lobe. Stable linear scarring in the left upper lobe near the apex. Other: None. IMPRESSION: CT HEAD: No acute intracranial abnormalities. CT CERVICAL SPINE: 1. No fracture or acute finding. 2. Mild ground-glass opacity in the medial left upper lobe, nonspecific but could reflect a small area of pulmonary contusion. Electronically Signed   By: Amie Portland M.D.   On: 07/23/2023 15:05   CT CERVICAL SPINE WO CONTRAST  Result Date: 07/23/2023 CLINICAL DATA:  Fall.  Head trauma. EXAM: CT HEAD WITHOUT CONTRAST CT CERVICAL SPINE WITHOUT CONTRAST TECHNIQUE: Multidetector CT imaging of the head and cervical spine was performed following the standard protocol without intravenous contrast. Multiplanar CT image reconstructions of the cervical spine were also generated. RADIATION DOSE REDUCTION: This exam was performed according to the departmental dose-optimization program which includes automated exposure control, adjustment of the mA and/or kV according to patient size and/or use of iterative reconstruction technique. COMPARISON:  08/23/2022 and 11/27/2021. FINDINGS: CT HEAD FINDINGS Brain: No evidence of acute infarction, hemorrhage, hydrocephalus, extra-axial collection or mass lesion/mass effect. Vascular: No hyperdense vessel or unexpected calcification. Skull: Normal. Negative for fracture or focal lesion. Sinuses/Orbits: Globes and orbits are unremarkable.  Sinuses are clear. Other: None. CT CERVICAL SPINE FINDINGS Alignment: Mild kyphosis, apex at C4-C5.  No spondylolisthesis. Skull base and vertebrae: No acute fracture. No primary bone lesion or focal pathologic process. Soft tissues and spinal canal: No prevertebral fluid or swelling. No visible canal hematoma. Disc levels: Mild loss of disc height at C4-C5. Moderate loss of disc height at C5-C6 and C6-C7. Mild disc bulging and endplate spurring noted at these levels. No disc herniation. No significant stenosis. Upper chest: Mild ground-glass opacity in the medial left upper lobe. Stable linear scarring in the left upper lobe near the apex. Other: None. IMPRESSION: CT HEAD: No acute intracranial abnormalities. CT CERVICAL SPINE: 1. No fracture or acute finding. 2. Mild ground-glass opacity in the medial left upper lobe, nonspecific but could reflect a small area of pulmonary contusion. Electronically Signed   By: Onalee Hua  Ormond M.D.   On: 07/23/2023 15:05   DG Pelvis Portable  Result Date: 07/23/2023 CLINICAL DATA:  Fall yesterday.  Pain.  Weakness. EXAM: PORTABLE PELVIS 1-2 VIEWS COMPARISON:  07/23/2020. FINDINGS: No fracture.  No bone lesion. Bilateral total hip arthroplasties appear well seated and well aligned. SI joints and symphysis pubis are normally spaced and aligned. Stable surgical vascular clips overlie the right ilium. IMPRESSION: 1. No fracture or acute finding. 2. Well aligned bilateral total hip arthroplasties. Electronically Signed   By: Amie Portland M.D.   On: 07/23/2023 13:39   DG Knee Right Port  Result Date: 07/23/2023 CLINICAL DATA:  Blunt trauma. Several falls yesterday. Fell down approximately 17 steps this morning. EXAM: PORTABLE RIGHT KNEE - 1-2 VIEW COMPARISON:  Right knee radiographs 05/12/2023 FINDINGS: Severe medial acromial joint space narrowing again with bone-on-bone contact, subchondral sclerosis, and moderate peripheral osteophytosis. Moderate to severe patellofemoral  joint space narrowing and peripheral osteophytosis, unchanged. Mild peripheral lateral compartment degenerative osteophytosis. No joint effusion. There are multiple linear lucencies overlying the posterior knee soft tissues and the proximal fibula on lateral view only from overlying clothing/sheet material. No acute fracture or dislocation. IMPRESSION: 1. No acute fracture. 2. Severe medial and patellofemoral compartment osteoarthritis, unchanged. Electronically Signed   By: Neita Garnet M.D.   On: 07/23/2023 13:38   DG Chest Port 1 View  Result Date: 07/23/2023 CLINICAL DATA:  Pain after trauma EXAM: PORTABLE CHEST 1 VIEW COMPARISON:  Chest x-ray 08/23/2022 FINDINGS: Enlarged cardiopericardial silhouette. Tortuous ectatic aorta. No consolidation, pneumothorax or effusion. No edema. Dense right upper lung nodule stable consistent with old granulomatous disease and likely calcified. Overlapping cardiac leads. Degenerative changes of the spine. If there is further concern of the sequela of trauma additional contrast CT could be considered as clinically appropriate for further sensitivity of injury. IMPRESSION: Enlarged cardiopericardial silhouette with a tortuous aorta. Chronic lung changes. Electronically Signed   By: Karen Kays M.D.   On: 07/23/2023 13:33    EKG: I independently viewed the EKG done and my findings are as followed: None available at the time of this visit.  Assessment/Plan Present on Admission:  Pyelonephritis  Principal Problem:   Pyelonephritis  Right pyelonephritis, POA UA positive for pyuria Right flank pain Monitor fever curve and WBCs Continue IV antibiotics Monitor cultures for ID and sensitivities Gentle IV fluid hydration Pain control  CKD 3B At baseline Avoid nephrotoxic agents, dehydration, and hypotension. IV fluid hydration NS at 100 cc/h x 1 day Monitor urine output  Mild non-anion gap metabolic acidosis secondary to renal insufficiency Serum bicarb  21, anion gap 9 Continue gentle IV fluid hydration Repeat renal function panel in the morning.  Acute metabolic encephalopathy likely secondary to active infection Continue to treat underlying conditions Reorient as needed Fall precautions.  Type 2 diabetes with hyperglycemia Last hemoglobin A1c 5.3 Hold off subcu insulin to avoid hypoglycemia CBG nightly  Seizure disorder Resume home AEDs Increased dose of Keppra to thousand twice daily as recommended by neurology Seizure precautions  Chronic anxiety/depression Bipolar disorder Resume home regimen  GERD Resume home PPI  Parkinson's disease Resume home regimen  Hyperlipidemia Resume home Crestor  Generalized weakness PT OT assessment Fall precautions.  Obesity BMI 36 Recommend weight loss outpatient with regular physical activity and healthy dieting.   Critical care time: 65 minutes   DVT prophylaxis: Subcu Lovenox daily.  Code Status: Full code  Family Communication: None at bedside.  Disposition Plan: Admitted to telemetry medical unit  Consults  called: None.  Admission status: Inpatient status.   Status is: Inpatient The patient requires at least 2 midnights for further evaluation and treatment of present condition   Darlin Drop MD [facility] Pager [phone]  If 7PM-7AM, please contact night-coverage [email or web address] Password [email or web address]  07/24/2023, 1:17 AM

## 2023-07-24 NOTE — Evaluation (Signed)
Physical Therapy Evaluation Patient Details Name: Valerie [name] MRN: [id number] DOB: 04-29-1961 Today's Date: 07/24/2023  History of Present Illness  62 y.o. female presents to ED 11/29 after a fall reporting ongoing R flank pain. Temperature 102.6.  BP 92/54, pulse 84 UA was positive for pyuria. Started on  IV antibiotics  WUJ:WJXBJYN disorder, bipolar disorder, TIA, Parkinson's disease, chronic urinary retention, CKD 3B, recently diagnosed dementia  Clinical Impression  PTA pt living alone in 2 story town house with 1 step to enter and bed and bath upstairs. Pt reports independence, working out at [employer], driving and independent with ADLs and iADLs. Pt is currently limited in safe mobility by pain in R knee and hip from falling, as well as underlying weakness and poor safety awareness. Pt is mod A for transfers and short bouts of ambulation in room. Patient will benefit from continued inpatient follow up therapy, <3 hours/day. PT will continue to follow acutely.     If plan is discharge home, recommend the following: A lot of help with walking and/or transfers;A lot of help with bathing/dressing/bathroom;Assistance with cooking/housework   Can travel by private vehicle   No    Equipment Recommendations None recommended by PT     Functional Status Assessment Patient has had a recent decline in their functional status and demonstrates the ability to make significant improvements in function in a reasonable and predictable amount of time.     Precautions / Restrictions Precautions Precautions: Fall Precaution Comments: fell down flight of stairs      Mobility  Bed Mobility               General bed mobility comments: sitting EoB on entry    Transfers Overall transfer level: Needs assistance Equipment used: Rolling walker (2 wheels), 1 person hand held assist Transfers: Sit to/from Stand, Bed to chair/wheelchair/BSC Sit to Stand: Mod assist, From elevated surface, Min  assist     Squat pivot transfers: Mod assist     General transfer comment: modA for squat pivot transfer to and from [facility], vc for hand placement and sequencing turn, light mod A for power up from bed to RW, and min A for power up from straight back chair with armrests    Ambulation/Gait Ambulation/Gait assistance: Mod assist, +2 safety/equipment, Min assist (assist for lines and chair follow) Gait Distance (Feet): 8 Feet (+12) Assistive device: Rolling walker (2 wheels) Gait Pattern/deviations: Step-to pattern, Decreased step length - left, Decreased stance time - right, Decreased weight shift to right, Antalgic, Trunk flexed Gait velocity: slowed Gait velocity interpretation: <1.31 ft/sec, indicative of household ambulator   General Gait Details: min progressing to modA with fatigue for slowed, antalgic gait with decreased weight shift to painful R leg, pt is use to a Rollator and needs constant cuing for proximity to RW      Balance Overall balance assessment: Needs assistance Sitting-balance support: Feet supported, No upper extremity supported, Bilateral upper extremity supported, Single extremity supported, Feet unsupported Sitting balance-Leahy Scale: Fair     Standing balance support: Bilateral upper extremity supported, Single extremity supported, During functional activity, Reliant on assistive device for balance Standing balance-Leahy Scale: Poor Standing balance comment: requires at least single UE support                             Pertinent Vitals/Pain Pain Assessment Pain Assessment: Faces Faces Pain Scale: Hurts even more Pain Location: R knee and hip greatest  pain but also head, R flank and back Pain Descriptors / Indicators: Sharp, Headache, Aching, Sore Pain Intervention(s): Limited activity within patient's tolerance, Monitored during session, Repositioned    Home Living Family/patient expects to be discharged to:: Private residence Living  Arrangements: Alone Available Help at Discharge: Available PRN/intermittently Type of Home:  (townhome) Home Access: Stairs to enter Entrance Stairs-Rails: Right;Left;Can reach both Entrance Stairs-Number of Steps: 1 Alternate Level Stairs-Number of Steps: 1 flight Home Layout: Two level;1/2 bath on main level;Bed/bath upstairs Home Equipment: Agricultural consultant (2 wheels);Rollator (4 wheels);Cane - single point;BSC/3in1;Shower seat Additional Comments: daughter working on getting bed to place on first floor    Prior Function Prior Level of Function : Independent/Modified Independent             Mobility Comments: reports 2 weeks ago she was going to the gym, and driving ADLs Comments: independent, 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,     Extremity/Trunk Assessment   Upper Extremity Assessment Upper Extremity Assessment: Defer to OT evaluation    Lower Extremity Assessment Lower Extremity Assessment: RLE deficits/detail;LLE deficits/detail RLE Deficits / Details: R knee flex and dorsiflexion 3/5 with slowed recruitment, RLE: Unable to fully assess due to pain RLE Sensation: WNL RLE Coordination: decreased fine motor LLE Deficits / Details: ROM WFL strength grossly 3+/5 LLE Sensation: WNL LLE Coordination: decreased fine motor    Cervical / Trunk Assessment Cervical / Trunk Assessment: Kyphotic;Other exceptions (pain from fall)  Communication   Communication Communication: No apparent difficulties  Cognition Arousal: Alert Behavior During Therapy: WFL for tasks assessed/performed Overall Cognitive Status: History of cognitive impairments - at baseline Area of Impairment: Safety/judgement                         Safety/Judgement: Decreased awareness of safety, Decreased awareness of deficits     General Comments: reports recent diagnosis of dementia, poor insight into safety in terms of home set up and return home without assist        General Comments  General comments (skin integrity, edema, etc.): VSS on RA, pt with tremors, greatest in R UE but present throughout body and increased with fatigue        Assessment/Plan    PT Assessment Patient needs continued PT services  PT Problem List Decreased strength;Decreased range of motion;Decreased activity tolerance;Decreased balance;Decreased mobility;Decreased coordination;Decreased safety awareness;Pain       PT Treatment Interventions DME instruction;Gait training;Stair training;Functional mobility training;Therapeutic activities;Therapeutic exercise;Balance training;Cognitive remediation;Patient/family education    PT Goals (Current goals can be found in the Care Plan section)  Acute Rehab PT Goals PT Goal Formulation: With patient/family Time For Goal Achievement: 08/07/23 Potential to Achieve Goals: Good    Frequency Min 1X/week        AM-PAC PT "6 Clicks" Mobility  Outcome Measure Help needed turning from your back to your side while in a flat bed without using bedrails?: A Little Help needed moving from lying on your back to sitting on the side of a flat bed without using bedrails?: A Little Help needed moving to and from a bed to a chair (including a wheelchair)?: A Lot Help needed standing up from a chair using your arms (e.g., wheelchair or bedside chair)?: A Lot Help needed to walk in hospital room?: A Lot Help needed climbing 3-5 steps with a railing? : Total 6 Click Score: 13    End of Session Equipment Utilized During Treatment: Gait belt Activity Tolerance: Patient limited by  pain Patient left: in chair;with call bell/phone within reach;with chair alarm set;with family/visitor present Nurse Communication: Mobility status PT Visit Diagnosis: Unsteadiness on feet (R26.81);Repeated falls (R29.6);Muscle weakness (generalized) (M62.81);Other abnormalities of gait and mobility (R26.89);Difficulty in walking, not elsewhere classified (R26.2)    Time: 1610-9604 PT  Time Calculation (min) (ACUTE ONLY): 56 min   Charges:   PT Evaluation $PT Eval Moderate Complexity: 1 Mod   PT General Charges $$ ACUTE PT VISIT: 1 Visit         Anahis Furgeson B. Beverely Risen PT, DPT Acute Rehabilitation Services Please use secure chat or  Call Office [phone]   Elon Alas [facility] 07/24/2023, 4:42 PM

## 2023-07-24 NOTE — Plan of Care (Signed)

## 2023-07-24 NOTE — Plan of Care (Signed)

## 2023-07-24 NOTE — Progress Notes (Signed)
  PROGRESS NOTE    Valerie [name]  UJW:[id number] DOB: [birth year] DOA: 07/23/2023 PCP: Myrlene Broker, MD    Brief Narrative:  62 year old with history of seizure disorder, bipolar disorder, history of stroke, Parkinson's and early dementia, chronic urinary retention currently not on Foley catheter, CKD stage IIIb, polypharmacy presented to the ER after she fell at home, felt groggy and lightheaded.  In the emergency room she was found to be febrile with temperature 102.6.  She was retaining urine, Foley catheter was placed, UA positive for pyuria. Started on Rocephin and admitted to the hospital.  She is already growing E. coli bacteremia.  Clinically stabilizing.  Neurology recommended to increase the dose of Keppra.  Subjective: Patient seen and examined.  She asked when she can go home.  Her daughter later arrived to the bedside and I discussed with her.  Patient currently denies any complaints at rest.  She has not been mobilized yet.  Remains afebrile since admission. Home medications reviewed.  She is on multiple medications through her psychiatrist and neurologist and how this could be contributing to her fall.  Lives at home and her daughter frequently supervises her.       Assessment & Plan:   E. coli bacteremia, pyelonephritis, UTI and sepsis present on admission Urinary retention probably due to polypharmacy  Sepsis physiology improving.  Continue Rocephin 2 g daily.  Foley catheter has been placed.  She will likely benefit with discharging with Foley and outpatient follow-up for voiding trial. Resume home medications. Work with PT OT. Anticipate home with home health PT OT next 24 to 48 hours.   Time spent: 35 minutes.  Same-day admit.  No charge visit.    Dorcas Carrow, MD [facility]

## 2023-07-24 NOTE — Evaluation (Signed)
Occupational Therapy Evaluation Patient Details Name: Valerie [name] MRN: [id number] DOB: Nov 20, 1960 Today's Date: 07/24/2023   History of Present Illness 62 y.o. female presents to ED 11/29 after a fall reporting ongoing R flank pain. Temperature 102.6.  BP 92/54, pulse 84 UA was positive for pyuria. Started on  IV antibiotics  NWG:NFAOZHY disorder, bipolar disorder, TIA, Parkinson's disease, chronic urinary retention, CKD 3B, recently diagnosed dementia.   Clinical Impression   Pt currently presents at a mod assist level for transfers and LB selfcare sit to stand.  Increased pain is reported with weightbearing over the LLE when using the RW.  Prior to admission she was modified independent at home per report but has been having more frequent falls resulting in this re-admission.  Feel she will benefit from acute care OT at this time to help increase balance, safety, and completion of basic ADLs at a more independent level.  Recommend post acute rehab at inpatient facility, <3 hours/day to progress to a safer level.  Discussion with pt and family on modifications post rehab that may need to be addressed to help pt succeed once back home including environmental modifications and having some outside assist.          If plan is discharge home, recommend the following: A little help with walking and/or transfers;A little help with bathing/dressing/bathroom;Assistance with cooking/housework;Help with stairs or ramp for entrance;Assist for transportation    Functional Status Assessment  Patient has had a recent decline in their functional status and demonstrates the ability to make significant improvements in function in a reasonable and predictable amount of time.  Equipment Recommendations  Other (comment) (TBD next venue of care)       Precautions / Restrictions Precautions Precautions: Fall Precaution Comments: fell down flight of stairs Restrictions Weight Bearing Restrictions: No       Mobility Bed Mobility                    Transfers Overall transfer level: Needs assistance Equipment used: Rolling walker (2 wheels), 1 person hand held assist Transfers: Sit to/from Stand, Bed to chair/wheelchair/BSC Sit to Stand: Mod assist, From elevated surface     Step pivot transfers: Mod assist     General transfer comment: Mod assist for transfers with the RW from bed to chair and from regular chair to recliner.  Limited by pain in the right knee with weightbearing when ambulating.      Balance Overall balance assessment: Needs assistance Sitting-balance support: Feet supported, No upper extremity supported, Bilateral upper extremity supported, Single extremity supported, Feet unsupported Sitting balance-Leahy Scale: Fair     Standing balance support: Bilateral upper extremity supported, Single extremity supported, During functional activity, Reliant on assistive device for balance Standing balance-Leahy Scale: Poor Standing balance comment: Pt needs UE support on the RW for mobility.                           ADL either performed or assessed with clinical judgement   ADL Overall ADL's : Needs assistance/impaired Eating/Feeding: Independent;Sitting   Grooming: Wash/dry hands;Wash/dry face;Minimal assistance;Standing   Upper Body Bathing: Supervision/ safety;Sitting   Lower Body Bathing: Moderate assistance;Sit to/from stand   Upper Body Dressing : Set up;Sitting   Lower Body Dressing: Moderate assistance;Sit to/from stand   Toilet Transfer: Moderate assistance;Stand-pivot;BSC/3in1   Toileting- Clothing Manipulation and Hygiene: Moderate assistance;Sit to/from stand       Functional mobility during ADLs: Moderate assistance;Rolling walker (  2 wheels) General ADL Comments: Pt's daughter present along with her significant other.  Discussed with all members the increased danger of going back to current home setup without 24 hour assist.  Pt  reports multiple falls down her steps as her bedroom and full bath are upstairs.  Daughter cannot provide 24 hour secondary to working and living 40 mins away.  Pt somewhat tearful as she does recognize her current situation and does not want to lose her independence.  Increased right knee pain noted in standing and with weightbearing.  Small step length noted with mod instructional cueing for upright posture and for standing gloser to the RW.     Vision Baseline Vision/History: 1 Wears glasses Ability to See in Adequate Light: 0 Adequate Patient Visual Report: No change from baseline Vision Assessment?: No apparent visual deficits Additional Comments: Pt reports some blurring of vision but not present this session as pt was able to read distance and near vision with use of her glasses.     Perception Perception: Within Functional Limits       Praxis Praxis: WFL       Pertinent Vitals/Pain Pain Assessment Pain Assessment: Faces Faces Pain Scale: Hurts little more Pain Location: R knee Pain Descriptors / Indicators: Aching, Sore Pain Intervention(s): Limited activity within patient's tolerance, Monitored during session, Repositioned     Extremity/Trunk Assessment Upper Extremity Assessment Upper Extremity Assessment: Generalized weakness   Lower Extremity Assessment Lower Extremity Assessment: Defer to PT evaluation RLE Deficits / Details: R knee flex and dorsiflexion 3/5 with slowed recruitment, RLE: Unable to fully assess due to pain RLE Sensation: WNL RLE Coordination: decreased fine motor LLE Deficits / Details: ROM WFL strength grossly 3+/5 LLE Sensation: WNL LLE Coordination: decreased fine motor   Cervical / Trunk Assessment Cervical / Trunk Assessment: Kyphotic;Other exceptions Cervical / Trunk Exceptions: flexed posture with mobility secondary to pain and weakness   Communication Communication Communication: No apparent difficulties   Cognition Arousal:  Alert Behavior During Therapy: WFL for tasks assessed/performed Overall Cognitive Status: History of cognitive impairments - at baseline Area of Impairment: Safety/judgement                         Safety/Judgement: Decreased awareness of safety, Decreased awareness of deficits     General Comments: Pt reports recent diagnosis of dementia, decreased insight into safety issues at home alone secondary to frequent falls down the steps.     General Comments  VSS on RA, pt with tremors, greatest in R UE but present throughout body and increased with fatigue            Home Living Family/patient expects to be discharged to:: Private residence Living Arrangements: Alone Available Help at Discharge: Available PRN/intermittently Type of Home:  (townhome) Home Access: Stairs to enter [employer] of Steps: 1 Entrance Stairs-Rails: Right;Left;Can reach both Home Layout: Two level;1/2 bath on main level;Bed/bath upstairs Alternate Level Stairs-Number of Steps: 1 flight   Bathroom Shower/Tub: Walk-in shower Industrial/product designer)   Bathroom Toilet: Standard Bathroom Accessibility: No   Home Equipment: Agricultural consultant (2 wheels);Rollator (4 wheels);Cane - single point;BSC/3in1;Shower seat   Additional Comments: daughter working on getting bed to place on first floor      Prior Functioning/Environment Prior Level of Function : Independent/Modified Independent             Mobility Comments: reports 2 weeks ago she was going to the gym, and driving ADLs Comments: independent, does laundry  for bed bound friend,        OT Problem List: Decreased strength;Decreased knowledge of use of DME or AE;Decreased activity tolerance;Decreased safety awareness;Impaired balance (sitting and/or standing);Pain      OT Treatment/Interventions: Self-care/ADL training;Balance training;Therapeutic activities;DME and/or AE instruction;Patient/family education;Therapeutic exercise    OT  Goals(Current goals can be found in the care plan section) Acute Rehab OT Goals Patient Stated Goal: Pt wants to go back home. OT Goal Formulation: With patient/family Time For Goal Achievement: 08/07/23 Potential to Achieve Goals: Good  OT Frequency: Min 1X/week    Co-evaluation PT/OT/SLP Co-Evaluation/Treatment: Yes     OT goals addressed during session: ADL's and self-care      AM-PAC OT "6 Clicks" Daily Activity     Outcome Measure Help from another person eating meals?: None Help from another person taking care of personal grooming?: A Little Help from another person toileting, which includes using toliet, bedpan, or urinal?: A Lot Help from another person bathing (including washing, rinsing, drying)?: A Lot Help from another person to put on and taking off regular upper body clothing?: A Little Help from another person to put on and taking off regular lower body clothing?: A Lot 6 Click Score: 16   End of Session Equipment Utilized During Treatment: Gait belt;Rolling walker (2 wheels) Nurse Communication: Mobility status  Activity Tolerance: Patient limited by pain Patient left: in chair;with call bell/phone within reach;with chair alarm set;with family/visitor present  OT Visit Diagnosis: Unsteadiness on feet (R26.81);Other abnormalities of gait and mobility (R26.89);Repeated falls (R29.6);Muscle weakness (generalized) (M62.81);History of falling (Z91.81);Pain Pain - Right/Left: Right Pain - part of body: Knee                Time: 1508-1600 OT Time Calculation (min): 52 min Charges:  OT General Charges $OT Visit: 1 Visit OT Evaluation $OT Eval Moderate Complexity: 1 Mod OT Treatments $Self Care/Home Management : 8-22 mins  Perrin Maltese, OTR/L Acute Rehabilitation Services  Office [phone] 07/24/2023

## 2023-07-25 ENCOUNTER — Inpatient Hospital Stay (HOSPITAL_COMMUNITY): Payer: Medicare Other

## 2023-07-25 DIAGNOSIS — N12 Tubulo-interstitial nephritis, not specified as acute or chronic: Secondary | ICD-10-CM | POA: Diagnosis not present

## 2023-07-25 DIAGNOSIS — R569 Unspecified convulsions: Secondary | ICD-10-CM

## 2023-07-25 LAB — CBC WITH DIFFERENTIAL/PLATELET
Abs Immature Granulocytes: 0.04 10*3/uL (ref 0.00–0.07)
Basophils Absolute: 0 10*3/uL (ref 0.0–0.1)
Basophils Relative: 0 %
Eosinophils Absolute: 0.2 10*3/uL (ref 0.0–0.5)
Eosinophils Relative: 2 %
HCT: 31.1 % — ABNORMAL LOW (ref 36.0–46.0)
Hemoglobin: 10.1 g/dL — ABNORMAL LOW (ref 12.0–15.0)
Immature Granulocytes: 0 %
Lymphocytes Relative: 7 %
Lymphs Abs: 0.7 10*3/uL (ref 0.7–4.0)
MCH: 31.9 pg (ref 26.0–34.0)
MCHC: 32.5 g/dL (ref 30.0–36.0)
MCV: 98.1 fL (ref 80.0–100.0)
Monocytes Absolute: 0.8 10*3/uL (ref 0.1–1.0)
Monocytes Relative: 9 %
Neutro Abs: 7.7 10*3/uL (ref 1.7–7.7)
Neutrophils Relative %: 82 %
Platelets: 136 10*3/uL — ABNORMAL LOW (ref 150–400)
RBC: 3.17 MIL/uL — ABNORMAL LOW (ref 3.87–5.11)
RDW: 13.6 % (ref 11.5–15.5)
WBC: 9.5 10*3/uL (ref 4.0–10.5)
nRBC: 0 % (ref 0.0–0.2)

## 2023-07-25 LAB — URINE CULTURE

## 2023-07-25 LAB — BASIC METABOLIC PANEL
Anion gap: 6 (ref 5–15)
BUN: 15 mg/dL (ref 8–23)
CO2: 24 mmol/L (ref 22–32)
Calcium: 7.9 mg/dL — ABNORMAL LOW (ref 8.9–10.3)
Chloride: 110 mmol/L (ref 98–111)
Creatinine, Ser: 1.72 mg/dL — ABNORMAL HIGH (ref 0.44–1.00)
GFR, Estimated: 33 mL/min — ABNORMAL LOW (ref >60)
Glucose, Bld: 104 mg/dL — ABNORMAL HIGH (ref 70–99)
Potassium: 4.4 mmol/L (ref 3.5–5.1)
Sodium: 140 mmol/L (ref 135–145)

## 2023-07-25 LAB — HIV ANTIBODY (ROUTINE TESTING W REFLEX): HIV Screen 4th Generation wRfx: NONREACTIVE

## 2023-07-25 LAB — LACTIC ACID, PLASMA: Lactic Acid, Venous: 0.8 mmol/L (ref 0.5–1.9)

## 2023-07-25 NOTE — Plan of Care (Signed)

## 2023-07-25 NOTE — Progress Notes (Signed)
PROGRESS NOTE    Valerie [name]  UVO:[id number] DOB: 1961/02/11 DOA: 07/23/2023 PCP: Myrlene Broker, MD  Chief Complaint  Patient presents with   Fall    Brief Narrative:   Valerie [name] is Valerie [name] 62 y.o. female with medical history significant for seizure disorder, bipolar disorder, TIA, Parkinson's disease, chronic urinary retention, CKD 3B, who presents to the ED after Valerie [name] fall.  Endorses being compliant with her seizure medications.  Reports right flank pain for the past few days.   In the ED, EDP discussed the case with neurology regarding her seizure disorder recommended to increase home Keppra dose to 1000 twice daily.  The patient was noted to be febrile in the ED with Tmax 102.6.  Foley catheter placed due to urinary retention.  UA was positive for pyuria.  The patient was started on empiric IV antibiotics.  Admitted by [facility], hospitalist service.  Assessment & Plan:   Principal Problem:   Pyelonephritis   E. coli bacteremia due to pyelonephritis Sepsis  Continue antibiotics with ceftriaxone Awaiting further culture results (urine culture multiple species, blood cultures with e. Coli)  Urinary retention Foley in place - not clear how much she was retaining, will try to figure that out Hold oxybutynin  May need to discharge with foley   Fall  Generalized Weakness Parkinson's Disease CT with soft tissue stranding along the posterior right upper quadrant and posterior left upper quadrant.  No acute traumatic injury in chest. CT right knee without evidence of acute fracture or dislocation.  CT head/C spine without fracture or acute finding.  PT/OT Sinemet for parkinsons  Stroke Like Symptoms Seizure Like Activity She reports neurologic symptoms over the past few weeks including two "TIA's" and Damia Bobrowski seizure Wednesday.  Will get EEG and MRI and follow results.  Consider neurology c/s if observed event or positive study.  EEG within normal limits  Seizure Disorder Continue  home meds Keppra increased to 1000 mg BID per neurology (discussed per EDP with neurology)  CKD IIIB Appears close to baseline, will trend CT without hydro  Acute Metabolic encephalopathy Resolved (noted at presentation)  Hyperlipidemia Crestor  Mood Disorder Continue home meds  Obesity Body mass index is 36.29 kg/m.    DVT prophylaxis: lovenox Code Status: full Family Communication: friend at bedside Disposition:   Status is: Inpatient Remains inpatient appropriate because: continued need for inpatient care   Consultants:  none  Procedures:  EEG IMPRESSION: This study is within normal limits. No seizures or epileptiform discharges were seen throughout the recording.   Valerie [name] normal interictal EEG does not exclude the diagnosis of epilepsy.   Valerie [name]   Antimicrobials:  Anti-infectives (From admission, onward)    Start     Dose/Rate Route Frequency Ordered Stop   07/24/23 0600  cefTRIAXone (ROCEPHIN) 2 g in sodium chloride 0.9 % 100 mL IVPB        2 g 200 mL/hr over 30 Minutes Intravenous Daily 07/24/23 0117     07/23/23 1745  cefTRIAXone (ROCEPHIN) 1 g in sodium chloride 0.9 % 100 mL IVPB        1 g 200 mL/hr over 30 Minutes Intravenous  Once 07/23/23 1742 07/23/23 1921       Subjective: Notes pain with repositioning due to pain  Objective: Vitals:   07/24/23 2334 07/25/23 0738 07/25/23 1119 07/25/23 1550  BP: (!) 168/129 (!) 115/56 (!) 130/54 (!) 128/57  Pulse: 74 67 60 (!) 56  Resp: 18 17 17 17   Temp:  99.1 F (37.3 C) 99 F (37.2 C) 98.9 F (37.2 C) 98.5 F (36.9 C)  TempSrc: Oral Oral Oral Oral  SpO2: (!) 85% 93% 93% 93%  Weight:      Height:        Intake/Output Summary (Last 24 hours) at 07/25/2023 1805 Last data filed at 07/25/2023 1500 Gross per 24 hour  Intake 100 ml  Output 4450 ml  Net -4350 ml   Filed Weights   07/24/23 0115  Weight: 90 kg    Examination:  General exam: Appears calm and comfortable  Respiratory  system: unlabored Cardiovascular system: RRR Gastrointestinal system: bruising to R abdomen, S/NT/ND Central nervous system: Alert and oriented. No focal neurological deficits. Extremities: no LEE    Data Reviewed: I have personally reviewed following labs and imaging studies  CBC: Recent Labs  Lab 07/23/23 1328 07/23/23 1351 07/24/23 0422 07/25/23 0743  WBC 16.4*  --  18.1* 9.5  NEUTROABS  --   --   --  7.7  HGB 11.7* 11.6* 10.1* 10.1*  HCT 36.1 34.0* 31.6* 31.1*  MCV 100.8*  --  100.3* 98.1  PLT 173  --  138* 136*    Basic Metabolic Panel: Recent Labs  Lab 07/23/23 1328 07/23/23 1351 07/24/23 0422 07/25/23 0743  NA 139 138 136 140  K 3.7 4.2 4.4 4.4  CL 105 104 106 110  CO2 24  --  21* 24  GLUCOSE 151* 147* 146* 104*  BUN 27* 39* 22 15  CREATININE 1.57* 1.50* 1.56* 1.72*  CALCIUM 8.3*  --  7.6* 7.9*  MG  --   --  1.7  --   PHOS  --   --  3.5  --     GFR: Estimated Creatinine Clearance: 34.9 mL/min (Valerie [name]) (by C-G formula based on SCr of 1.72 mg/dL (H)).  Liver Function Tests: Recent Labs  Lab 07/23/23 1328  AST 16  ALT <5  ALKPHOS 76  BILITOT 0.5  PROT 5.2*  ALBUMIN 3.0*    CBG: No results for input(s): "GLUCAP" in the last 168 hours.   Recent Results (from the past 240 hour(s))  Blood culture (routine x 2)     Status: None (Preliminary result)   Collection Time: 07/23/23  6:12 PM   Specimen: BLOOD  Result Value Ref Range Status   Specimen Description BLOOD BLOOD RIGHT ARM  Final   Special Requests   Final    BOTTLES DRAWN AEROBIC AND ANAEROBIC Blood Culture adequate volume   Culture  Setup Time   Final    GRAM NEGATIVE RODS IN BOTH AEROBIC AND ANAEROBIC BOTTLES CRITICAL VALUE NOTED.  VALUE IS CONSISTENT WITH PREVIOUSLY REPORTED AND CALLED VALUE.    Culture   Final    GRAM NEGATIVE RODS IDENTIFICATION TO FOLLOW Performed at [facility], [address], [place], [place] [address]    Report Status PENDING  Incomplete  Blood culture  (routine x 2)     Status: Abnormal (Preliminary result)   Collection Time: 07/23/23  6:22 PM   Specimen: BLOOD  Result Value Ref Range Status   Specimen Description BLOOD BLOOD LEFT ARM  Final   Special Requests   Final    BOTTLES DRAWN AEROBIC AND ANAEROBIC Blood Culture adequate volume   Culture  Setup Time   Final    GRAM NEGATIVE RODS IN BOTH AEROBIC AND ANAEROBIC BOTTLES CRITICAL RESULT CALLED TO, READ BACK BY AND VERIFIED WITH: PHARMD G. ABBOTT 07/24/2023 @ 0658 BY AB  Culture (Valerie [name])  Final    ESCHERICHIA COLI SUSCEPTIBILITIES TO FOLLOW Performed at [facility], [address], [place], [place] [address]    Report Status PENDING  Incomplete  Blood Culture ID Panel (Reflexed)     Status: Abnormal   Collection Time: 07/23/23  6:22 PM  Result Value Ref Range Status   Enterococcus faecalis NOT DETECTED NOT DETECTED Final   Enterococcus Faecium NOT DETECTED NOT DETECTED Final   Listeria monocytogenes NOT DETECTED NOT DETECTED Final   Staphylococcus species NOT DETECTED NOT DETECTED Final   Staphylococcus aureus (BCID) NOT DETECTED NOT DETECTED Final   Staphylococcus epidermidis NOT DETECTED NOT DETECTED Final   Staphylococcus lugdunensis NOT DETECTED NOT DETECTED Final   Streptococcus species NOT DETECTED NOT DETECTED Final   Streptococcus agalactiae NOT DETECTED NOT DETECTED Final   Streptococcus pneumoniae NOT DETECTED NOT DETECTED Final   Streptococcus pyogenes NOT DETECTED NOT DETECTED Final   Valerie [name].calcoaceticus-baumannii NOT DETECTED NOT DETECTED Final   Bacteroides fragilis NOT DETECTED NOT DETECTED Final   Enterobacterales DETECTED (Valerie [name]) NOT DETECTED Final    Comment: Enterobacterales represent Valerie Fandino large order of gram negative bacteria, not Autym Siess single organism. CRITICAL RESULT CALLED TO, READ BACK BY AND VERIFIED WITH: PHARMD G. ABBOTT 07/24/2023 @ 0658 BY AB    Enterobacter cloacae complex NOT DETECTED NOT DETECTED Final   Escherichia coli DETECTED (Valerie [name]) NOT DETECTED  Final    Comment: CRITICAL RESULT CALLED TO, READ BACK BY AND VERIFIED WITH: PHARMD G. ABBOTT 07/24/2023 @ 0658 BY AB    Klebsiella aerogenes NOT DETECTED NOT DETECTED Final   Klebsiella oxytoca NOT DETECTED NOT DETECTED Final   Klebsiella pneumoniae NOT DETECTED NOT DETECTED Final   Proteus species NOT DETECTED NOT DETECTED Final   Salmonella species NOT DETECTED NOT DETECTED Final   Serratia marcescens NOT DETECTED NOT DETECTED Final   Haemophilus influenzae NOT DETECTED NOT DETECTED Final   Neisseria meningitidis NOT DETECTED NOT DETECTED Final   Pseudomonas aeruginosa NOT DETECTED NOT DETECTED Final   Stenotrophomonas maltophilia NOT DETECTED NOT DETECTED Final   Candida albicans NOT DETECTED NOT DETECTED Final   Candida auris NOT DETECTED NOT DETECTED Final   Candida glabrata NOT DETECTED NOT DETECTED Final   Candida krusei NOT DETECTED NOT DETECTED Final   Candida parapsilosis NOT DETECTED NOT DETECTED Final   Candida tropicalis NOT DETECTED NOT DETECTED Final   Cryptococcus neoformans/gattii NOT DETECTED NOT DETECTED Final   CTX-M ESBL NOT DETECTED NOT DETECTED Final   Carbapenem resistance IMP NOT DETECTED NOT DETECTED Final   Carbapenem resistance KPC NOT DETECTED NOT DETECTED Final   Carbapenem resistance NDM NOT DETECTED NOT DETECTED Final   Carbapenem resist OXA 48 LIKE NOT DETECTED NOT DETECTED Final   Carbapenem resistance VIM NOT DETECTED NOT DETECTED Final    Comment: Performed at [facility], [address], [place], [place] [address]  Urine Culture (for pregnant, neutropenic or urologic patients or patients with an indwelling urinary catheter)     Status: Abnormal   Collection Time: 07/24/23  1:19 AM   Specimen: Urine, Clean Catch  Result Value Ref Range Status   Specimen Description URINE, CLEAN CATCH  Final   Special Requests   Final    NONE Performed at [facility], [address], [place], [place] [address]    Culture MULTIPLE SPECIES PRESENT,  SUGGEST RECOLLECTION (Daizha Anand)  Final   Report Status 07/25/2023 FINAL  Final         Radiology Studies:  EEG adult  Result Date: 07/25/2023 Valerie Quest, MD     07/25/2023  5:24 PM Patient Name: Valerie [name] MRN: [id number] Epilepsy Attending: Charlsie [name] Referring Physician/Provider: Zigmund [name]., MD Date: 07/25/2023 Duration: 22.28 mins Patient history: 62yo F with ams getting eeg to evaluate for seizure Level of alertness: Awake, asleep AEDs during EEG study: Xanax, LEV, LTG, GBP, primidone Technical aspects: This EEG study was done with scalp electrodes positioned according to the 10-20 International system of electrode placement. Electrical activity was reviewed with band pass filter of 1-70Hz , sensitivity of 7 uV/mm, display speed of 53mm/sec with Athleen Feltner 60Hz  notched filter applied as appropriate. EEG data were recorded continuously and digitally stored.  Video monitoring was available and reviewed as appropriate. Description: The posterior dominant rhythm consists of 8-9 Hz activity of moderate voltage (25-35 uV) seen predominantly in posterior head regions, symmetric and reactive to eye opening and eye closing. Sleep was characterized by vertex waves, sleep spindles (12 to 14 Hz), maximal frontocentral region. Physiologic photic driving was seen during photic stimulation. Hyperventilation was not performed.   IMPRESSION: This study is within normal limits. No seizures or epileptiform discharges were seen throughout the recording. Cyndie Woodbeck normal interictal EEG does not exclude the diagnosis of epilepsy. Valerie [name]        Scheduled Meds:  acetaminophen  1,000 mg Oral Once   carbidopa-levodopa  1 tablet Oral TID WC   Chlorhexidine Gluconate Cloth  6 each Topical Daily   donepezil  10 mg Oral Daily   enoxaparin (LOVENOX) injection  40 mg Subcutaneous Daily   FLUoxetine  40 mg Oral Daily   gabapentin  100 mg Oral QHS   lamoTRIgine  150 mg Oral BID   levETIRAcetam  1,000 mg Oral  BID   memantine  10 mg Oral BID   oxybutynin  5 mg Oral BID   pantoprazole  40 mg Oral Daily   predniSONE  5 mg Oral BID WC   primidone  100 mg Oral TID   rosuvastatin  10 mg Oral Daily   traZODone  200 mg Oral QHS   triamcinolone cream   Topical BID   Continuous Infusions:  cefTRIAXone (ROCEPHIN)  IV Stopped (07/25/23 0943)     LOS: 2 days    Time spent: over 30 min    Lacretia Nicks, MD [facility]   To contact the attending provider between 7A-7P or the covering provider during after hours 7P-7A, please log into the web site [email or web address] and access using universal [place] password for that web site. If you do not have the password, please call the hospital operator.  07/25/2023, 6:05 PM

## 2023-07-25 NOTE — Progress Notes (Signed)
EEG complete - results pending 

## 2023-07-25 NOTE — Procedures (Signed)
Patient Name: Arlayne Hasselman  MRN: [id number]  Epilepsy Attending: Charlsie Quest  Referring Physician/Provider: Zigmund Daniel., MD  Date: 07/25/2023 Duration: 22.28 mins  Patient history: 62yo F with ams getting eeg to evaluate for seizure  Level of alertness: Awake, asleep  AEDs during EEG study: Xanax, LEV, LTG, GBP, primidone  Technical aspects: This EEG study was done with scalp electrodes positioned according to the 10-20 International system of electrode placement. Electrical activity was reviewed with band pass filter of 1-70Hz , sensitivity of 7 uV/mm, display speed of 26mm/sec with a 60Hz  notched filter applied as appropriate. EEG data were recorded continuously and digitally stored.  Video monitoring was available and reviewed as appropriate.  Description: The posterior dominant rhythm consists of 8-9 Hz activity of moderate voltage (25-35 uV) seen predominantly in posterior head regions, symmetric and reactive to eye opening and eye closing. Sleep was characterized by vertex waves, sleep spindles (12 to 14 Hz), maximal frontocentral region. Physiologic photic driving was seen during photic stimulation. Hyperventilation was not performed.     IMPRESSION: This study is within normal limits. No seizures or epileptiform discharges were seen throughout the recording.  A normal interictal EEG does not exclude the diagnosis of epilepsy.  Madylin Fairbank Annabelle Harman

## 2023-07-26 DIAGNOSIS — N12 Tubulo-interstitial nephritis, not specified as acute or chronic: Secondary | ICD-10-CM | POA: Diagnosis not present

## 2023-07-26 LAB — CULTURE, BLOOD (ROUTINE X 2)
Special Requests: ADEQUATE
Special Requests: ADEQUATE

## 2023-07-26 LAB — GLUCOSE, CAPILLARY: Glucose-Capillary: 106 mg/dL — ABNORMAL HIGH (ref 70–99)

## 2023-07-26 MED ORDER — CEFAZOLIN SODIUM-DEXTROSE 2-4 GM/100ML-% IV SOLN: 2.0000 g | Freq: Three times a day (TID) | INTRAVENOUS | Status: DC

## 2023-07-26 MED ORDER — CEFAZOLIN SODIUM-DEXTROSE 2-4 GM/100ML-% IV SOLN
2.0000 g | Freq: Three times a day (TID) | INTRAVENOUS | Status: DC
  Administered 2023-07-26 – 2023-07-27 (×3): 2 g via INTRAVENOUS
  Filled 2023-07-26 (×3): qty 100

## 2023-07-26 NOTE — NC FL2 (Signed)
[place] MEDICAID FL2 LEVEL OF CARE FORM     IDENTIFICATION  Patient Name: Valerie [name] Birthdate: Aug 06, 1961 Sex: female Admission Date (Current Location): 07/23/2023  [facility] and [place] Number:  Producer, television/film/video and Address:  [facility], [address], [place], [place] [address]      Provider Number: [id number]  Attending Physician Name and Address:  Zigmund Daniel., *  Relative Name and Phone Number:  Tieesha, [place] (Daughter)  [phone] (Mobile)    Current Level of Care: Hospital Recommended Level of Care: Skilled Nursing Facility Prior Approval Number:    Date Approved/Denied:   PASRR Number: Pending  Discharge Plan: SNF    Current Diagnoses: Patient Active Problem List   Diagnosis Date Noted   Pyelonephritis 07/23/2023   Primary osteoarthritis involving multiple joints 06/20/2023   Long term (current) use of systemic steroids 06/20/2023   High risk medication use 05/12/2023   Vitamin D deficiency 05/12/2023   Tremor 02/11/2023   Encounter for general adult medical examination with abnormal findings 02/11/2023   Fall 02/11/2023   Candidal skin infection 12/04/2022   Urinary incontinence 12/04/2022   GERD (gastroesophageal reflux disease) 10/22/2022   Hyperlipidemia associated with type 2 diabetes mellitus (HCC) 10/22/2022   Hyperparathyroidism (HCC) 12/03/2020   Multiple thyroid nodules 12/03/2020   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   Anemia, chronic disease 07/18/2020   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 07/18/2020   Generalized weakness 06/04/2020   CAD (coronary artery disease) 06/04/2020   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 05/20/2020   DM type 2 (diabetes mellitus, type 2) (HCC) 11/14/2019   Anxiety    Morbid obesity (HCC)    CKD (chronic kidney disease), stage III (HCC)    Psoriatic arthritis (HCC)    Tobacco abuse    OSA  (obstructive sleep apnea)    Neuropathy    Bipolar disorder (HCC)    Osteoarthritis of right hip 11/05/2014   Essential hypertension 10/29/2014    Orientation RESPIRATION BLADDER Height & Weight     Self, Time, Situation, Place  Normal Indwelling catheter Weight: 198 lb 6.6 oz (90 kg) Height:  5\' 2"  (157.5 cm)  BEHAVIORAL SYMPTOMS/MOOD NEUROLOGICAL BOWEL NUTRITION STATUS      Continent Diet (see d/c summary)  AMBULATORY STATUS COMMUNICATION OF NEEDS Skin   Extensive Assist Verbally Normal                       Personal Care Assistance Level of Assistance  Bathing, Feeding, Dressing Bathing Assistance: Limited assistance Feeding assistance: Independent Dressing Assistance: Limited assistance     Functional Limitations Info  Sight, Hearing, Speech Sight Info: Impaired Hearing Info: Adequate Speech Info: Adequate    SPECIAL CARE FACTORS FREQUENCY  PT (By licensed PT), OT (By licensed OT)     PT Frequency: 5x/week OT Frequency: 5x/week            Contractures Contractures Info: Not present    Additional Factors Info  Code Status, Allergies Code Status Info: full code Allergies Info: Carbamazepine, Sertraline Hcl           Current Medications (07/26/2023):  This is the current hospital active medication list Current Facility-Administered Medications  Medication Dose Route Frequency Provider Last Rate Last Admin   acetaminophen (TYLENOL) tablet 650 mg  650 mg Oral Q6H PRN Dow Adolph N, DO   650 mg at 07/24/23 0131   ALPRAZolam Prudy Feeler)  tablet 0.25 mg  0.25 mg Oral TID PRN Dorcas Carrow, MD   0.25 mg at 07/25/23 2252   carbidopa-levodopa (SINEMET IR) 25-100 MG per tablet immediate release 1 tablet  1 tablet Oral TID WC Dow Adolph N, DO   1 tablet at 07/26/23 1144   ceFAZolin (ANCEF) IVPB 2g/100 mL premix  2 g Intravenous Q8H Zigmund Daniel., MD       Chlorhexidine Gluconate Cloth 2 % PADS 6 each  6 each Topical Daily Dorcas Carrow, MD   6 each at  07/26/23 0839   donepezil (ARICEPT) tablet 10 mg  10 mg Oral Daily Dorcas Carrow, MD   10 mg at 07/26/23 0831   enoxaparin (LOVENOX) injection 40 mg  40 mg Subcutaneous Daily Dow Adolph N, DO   40 mg at 07/26/23 0831   FLUoxetine (PROZAC) capsule 40 mg  40 mg Oral Daily Dow Adolph N, DO   40 mg at 07/26/23 0830   gabapentin (NEURONTIN) capsule 100 mg  100 mg Oral QHS Dow Adolph N, DO   100 mg at 07/25/23 2251   HYDROcodone-acetaminophen (NORCO) 10-325 MG per tablet 1 tablet  1 tablet Oral Q6H PRN Dorcas Carrow, MD       lamoTRIgine (LAMICTAL) tablet 150 mg  150 mg Oral BID Dow Adolph N, DO   150 mg at 07/26/23 0830   levETIRAcetam (KEPPRA) tablet 1,000 mg  1,000 mg Oral BID Dow Adolph N, DO   1,000 mg at 07/26/23 0830   melatonin tablet 5 mg  5 mg Oral QHS PRN Dow Adolph N, DO   5 mg at 07/25/23 2249   memantine (NAMENDA) tablet 10 mg  10 mg Oral BID Dorcas Carrow, MD   10 mg at 07/26/23 0830   pantoprazole (PROTONIX) EC tablet 40 mg  40 mg Oral Daily Dow Adolph N, DO   40 mg at 07/26/23 0830   polyethylene glycol (MIRALAX / GLYCOLAX) packet 17 g  17 g Oral Daily PRN Dow Adolph N, DO       predniSONE (DELTASONE) tablet 5 mg  5 mg Oral BID WC Dorcas Carrow, MD   5 mg at 07/26/23 0830   primidone (MYSOLINE) tablet 100 mg  100 mg Oral TID Dorcas Carrow, MD   100 mg at 07/26/23 1144   prochlorperazine (COMPAZINE) injection 5 mg  5 mg Intravenous Q6H PRN Dow Adolph N, DO       rosuvastatin (CRESTOR) tablet 10 mg  10 mg Oral Daily [place], Carole N, DO   10 mg at 07/26/23 0831   SUMAtriptan (IMITREX) tablet 100 mg  100 mg Oral Q2H PRN Dorcas Carrow, MD   100 mg at 07/26/23 1144   traZODone (DESYREL) tablet 200 mg  200 mg Oral QHS Dorcas Carrow, MD   200 mg at 07/25/23 2247   triamcinolone cream (KENALOG) 0.1 % cream   Topical BID Dorcas Carrow, MD   Given at 07/26/23 [phone]     Discharge Medications: Please see discharge summary for a list of discharge medications.  Relevant  Imaging Results:  Relevant Lab Results:   Additional Information SSN [id number] [place], [place]

## 2023-07-26 NOTE — Progress Notes (Cosign Needed Addendum)
       RE:   Valerie [name]   Date of Birth:   1961-08-12  Date:   07/26/2023     To Whom It May Concern:  Please be advised that the above-named patient will require a short-term nursing home stay - anticipated 30 days or less for rehabilitation and strengthening.  The plan is for return home.

## 2023-07-26 NOTE — TOC Progression Note (Signed)
Transition of Care [facility]) - Progression Note    Patient Details  Name: Valerie [name] MRN: [id number] Date of Birth: [birth year]  [facility] CM/SW Contact  Erin Sons, [place] Phone Number: 07/26/2023, 1:26 PM  Clinical Narrative:     Pt agreeable to SNF now. As noted in PT eval, pt does not want [facility] or [facility]. Fl2 completed and bed requests sent in hub.   PASRR pending.    Expected Discharge Plan: Home w Home Health Services Barriers to Discharge: Continued Medical Work up  Expected Discharge Plan and Services       Living arrangements for the past 2 months: Apartment                                       Social Determinants of Health (SDOH) Interventions SDOH Screenings   Food Insecurity: No Food Insecurity (07/24/2023)  Housing: Low Risk  (07/24/2023)  Transportation Needs: No Transportation Needs (07/24/2023)  Utilities: Not At Risk (07/24/2023)  Alcohol Screen: Low Risk  (12/04/2022)  Depression (PHQ2-9): Medium Risk (07/20/2023)  Financial Resource Strain: High Risk (01/11/2023)  Physical Activity: Inactive (07/20/2023)  Social Connections: Socially Isolated (12/04/2022)  Stress: Stress Concern Present (07/20/2023)  Tobacco Use: High Risk (07/23/2023)    Readmission Risk Interventions     No data to display

## 2023-07-26 NOTE — TOC Initial Note (Signed)
Transition of Care [facility]) - Initial/Assessment Note    Patient Details  Name: Valerie [name] MRN: [id number] Date of Birth: 1960-12-05  Transition of Care [facility]) CM/SW Contact:    Erin Sons, LCSW Phone Number: 07/26/2023, 12:20 PM  Clinical Narrative:                  CSW met with pt to discuss SNF recommendation. Pt states she wants to go home with [facility] instead of SNF as she has had 2 negative experiences at SNFs in the past. She lives at address listed in chart. She reports her friends have moved a bed to the first floor so she can access it there temporarily. She lives alone but states she will be having friends stay with her. She has a rolling walker, wheelchair, and 3in1; does not report any additional DME needs. Pt has no preference in [facility] agency. RNCM and MD notified.   Expected Discharge Plan: Home w Home Health Services Barriers to Discharge: Continued Medical Work up   Patient Goals and CMS Choice Patient states their goals for this hospitalization and ongoing recovery are:: Wants to go home          Expected Discharge Plan and Services       Living arrangements for the past 2 months: Apartment                                      Prior Living Arrangements/Services Living arrangements for the past 2 months: Apartment Lives with:: Self Patient language and need for interpreter reviewed:: Yes        Need for Family Participation in Patient Care: No (Comment) Care giver support system in place?: Yes (comment)   Criminal Activity/Legal Involvement Pertinent to Current Situation/Hospitalization: No - Comment as needed  Activities of Daily Living   ADL Screening (condition at time of admission) Independently performs ADLs?: Yes (appropriate for developmental age) Is the patient deaf or have difficulty hearing?: No Does the patient have difficulty seeing, even when wearing glasses/contacts?: No Does the patient have difficulty concentrating, remembering, or  making decisions?: No  Permission Sought/Granted                  Emotional Assessment Appearance:: Appears stated age Attitude/Demeanor/Rapport: Engaged Affect (typically observed): Accepting Orientation: : Oriented to Self, Oriented to Place, Oriented to  Time, Oriented to Situation Alcohol / Substance Use: Not Applicable Psych Involvement: No (comment)  Admission diagnosis:  Seizure (HCC) [R56.9] Pyelonephritis [N12] Fall, initial encounter [W19.XXXA] Acute pain of right knee [M25.561] Patient Active Problem List   Diagnosis Date Noted   Pyelonephritis 07/23/2023   Primary osteoarthritis involving multiple joints 06/20/2023   Long term (current) use of systemic steroids 06/20/2023   High risk medication use 05/12/2023   Vitamin D deficiency 05/12/2023   Tremor 02/11/2023   Encounter for general adult medical examination with abnormal findings 02/11/2023   Fall 02/11/2023   Candidal skin infection 12/04/2022   Urinary incontinence 12/04/2022   GERD (gastroesophageal reflux disease) 10/22/2022   Hyperlipidemia associated with type 2 diabetes mellitus (HCC) 10/22/2022   Hyperparathyroidism (HCC) 12/03/2020   Multiple thyroid nodules 12/03/2020   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   Anemia, chronic disease 07/18/2020   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 07/18/2020   Generalized weakness 06/04/2020   CAD (coronary artery disease) 06/04/2020   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 05/20/2020   DM type 2 (diabetes mellitus, type 2) (HCC) 11/14/2019   Anxiety    Morbid obesity (HCC)    CKD (chronic kidney disease), stage III (HCC)    Psoriatic arthritis (HCC)    Tobacco abuse    OSA (obstructive sleep apnea)    Neuropathy    Bipolar disorder (HCC)    Osteoarthritis of right hip 11/05/2014   Essential hypertension 10/29/2014   PCP:  Myrlene Broker, MD Pharmacy:   [facility]  #[id number] [place] Otto, [place] - [address] AT [facility] & [address] [place] [address] Phone: [phone] Fax: [phone]     Social Determinants of Health (SDOH) Social History: SDOH Screenings   Food Insecurity: No Food Insecurity (07/24/2023)  Housing: Low Risk  (07/24/2023)  Transportation Needs: No Transportation Needs (07/24/2023)  Utilities: Not At Risk (07/24/2023)  Alcohol Screen: Low Risk  (12/04/2022)  Depression (PHQ2-9): Medium Risk (07/20/2023)  Financial Resource Strain: High Risk (01/11/2023)  Physical Activity: Inactive (07/20/2023)  Social Connections: Socially Isolated (12/04/2022)  Stress: Stress Concern Present (07/20/2023)  Tobacco Use: High Risk (07/23/2023)   SDOH Interventions:     Readmission Risk Interventions     No data to display

## 2023-07-26 NOTE — Progress Notes (Signed)
PROGRESS NOTE    Valerie [name]  WUJ:[id number] DOB: 1961-04-11 DOA: 07/23/2023 PCP: Myrlene Broker, MD  Chief Complaint  Patient presents with   Fall    Brief Narrative:   Valerie [name] is Valerie [name] 62 y.o. female with medical history significant for seizure disorder, bipolar disorder, TIA, Parkinson's disease, chronic urinary retention, CKD 3B, who presents to the ED after Valerie [name] fall.  Endorses being compliant with her seizure medications.  Reports right flank pain for the past few days.   In the ED, EDP discussed the case with neurology regarding her seizure disorder recommended to increase home Keppra dose to 1000 twice daily.  The patient was noted to be febrile in the ED with Tmax 102.6.  Foley catheter placed due to urinary retention.  UA was positive for pyuria.  The patient was started on empiric IV antibiotics.  Admitted by [name], hospitalist service.  Assessment & Plan:   Principal Problem:   Pyelonephritis   E. coli bacteremia due to pyelonephritis Sepsis  Sensitive to cefazolin -> will transition to ancef  Awaiting further culture results (urine culture multiple species, blood cultures with e. Coli)  Urinary retention Foley in place - not clear how much she was retaining, will try to figure that out (will discuss with RN) Hold oxybutynin  May need to discharge with foley   Fall  Generalized Weakness Parkinson's Disease CT with soft tissue stranding along the posterior right upper quadrant and posterior left upper quadrant.  No acute traumatic injury in chest. CT right knee without evidence of acute fracture or dislocation.  CT head/C spine without fracture or acute finding.  PT/OT Sinemet for parkinsons  Stroke Like Symptoms Seizure Like Activity She reports neurologic symptoms over the past few weeks including two "TIA's" and Valerie [name] seizure Wednesday.  Will get EEG and MRI and follow results.  Consider neurology c/s if observed event or positive study.  EEG within normal  limits MRI is still pending  Seizure Disorder Continue home meds Keppra increased to 1000 mg BID per neurology (discussed per EDP with neurology)  CKD IIIB Appears close to baseline, will trend CT without hydro  Acute Metabolic encephalopathy Resolved (noted at presentation)  Hyperlipidemia Crestor  Mood Disorder Continue home meds  Obesity Body mass index is 36.29 kg/m.    DVT prophylaxis: lovenox Code Status: full Family Communication: friend at bedside Disposition:   Status is: Inpatient Remains inpatient appropriate because: continued need for inpatient care   Consultants:  none  Procedures:  EEG IMPRESSION: This study is within normal limits. No seizures or epileptiform discharges were seen throughout the recording.   Valerie [name] normal interictal EEG does not exclude the diagnosis of epilepsy.   Valerie [name]   Antimicrobials:  Anti-infectives (From admission, onward)    Start     Dose/Rate Route Frequency Ordered Stop   07/26/23 1400  ceFAZolin (ANCEF) IVPB 2g/100 mL premix  Status:  Discontinued        2 g 200 mL/hr over 30 Minutes Intravenous Every 8 hours 07/26/23 1052 07/26/23 1053   07/26/23 1400  ceFAZolin (ANCEF) IVPB 2g/100 mL premix       Note to Pharmacy: Retime as needed based on last ceftriaxone dose   2 g 200 mL/hr over 30 Minutes Intravenous Every 8 hours 07/26/23 1053     07/24/23 0600  cefTRIAXone (ROCEPHIN) 2 g in sodium chloride 0.9 % 100 mL IVPB  Status:  Discontinued        2 g 200 mL/hr  over 30 Minutes Intravenous Daily 07/24/23 0117 07/26/23 1052   07/23/23 1745  cefTRIAXone (ROCEPHIN) 1 g in sodium chloride 0.9 % 100 mL IVPB        1 g 200 mL/hr over 30 Minutes Intravenous  Once 07/23/23 1742 07/23/23 1921       Subjective: No new complaints  Objective: Vitals:   07/25/23 2325 07/26/23 0339 07/26/23 0806 07/26/23 1233  BP: (!) 95/45 115/60 (!) 126/58 (!) 170/102  Pulse: 64 (!) 51 (!) 57 61  Resp: 18 18 17 17   Temp: 98  F (36.7 C) 98.4 F (36.9 C) 97.9 F (36.6 C) 98.6 F (37 C)  TempSrc: Oral Oral Oral Oral  SpO2: 90% 91% 92% 94%  Weight:      Height:        Intake/Output Summary (Last 24 hours) at 07/26/2023 1338 Last data filed at 07/26/2023 1318 Gross per 24 hour  Intake 517 ml  Output 3700 ml  Net -3183 ml   Filed Weights   07/24/23 0115  Weight: 90 kg    Examination:  General: No acute distress. Cardiovascular: RRR Lungs: unlabored Abdomen: s/nt/nd Neurological: Alert and oriented 3. Moves all extremities 4 with equal strength. Cranial nerves II through XII grossly intact. Extremities: No clubbing or cyanosis. No edema.   Data Reviewed: I have personally reviewed following labs and imaging studies  CBC: Recent Labs  Lab 07/23/23 1328 07/23/23 1351 07/24/23 0422 07/25/23 0743  WBC 16.4*  --  18.1* 9.5  NEUTROABS  --   --   --  7.7  HGB 11.7* 11.6* 10.1* 10.1*  HCT 36.1 34.0* 31.6* 31.1*  MCV 100.8*  --  100.3* 98.1  PLT 173  --  138* 136*    Basic Metabolic Panel: Recent Labs  Lab 07/23/23 1328 07/23/23 1351 07/24/23 0422 07/25/23 0743  NA 139 138 136 140  K 3.7 4.2 4.4 4.4  CL 105 104 106 110  CO2 24  --  21* 24  GLUCOSE 151* 147* 146* 104*  BUN 27* 39* 22 15  CREATININE 1.57* 1.50* 1.56* 1.72*  CALCIUM 8.3*  --  7.6* 7.9*  MG  --   --  1.7  --   PHOS  --   --  3.5  --     GFR: Estimated Creatinine Clearance: 34.9 mL/min ([name]) (by C-G formula based on SCr of 1.72 mg/dL (H)).  Liver Function Tests: Recent Labs  Lab 07/23/23 1328  AST 16  ALT <5  ALKPHOS 76  BILITOT 0.5  PROT 5.2*  ALBUMIN 3.0*    CBG: No results for input(s): "GLUCAP" in the last 168 hours.   Recent Results (from the past 240 hour(s))  Blood culture (routine x 2)     Status: Abnormal   Collection Time: 07/23/23  6:12 PM   Specimen: BLOOD  Result Value Ref Range Status   Specimen Description BLOOD BLOOD RIGHT ARM  Final   Special Requests   Final    BOTTLES DRAWN  AEROBIC AND ANAEROBIC Blood Culture adequate volume   Culture  Setup Time   Final    GRAM NEGATIVE RODS IN BOTH AEROBIC AND ANAEROBIC BOTTLES CRITICAL VALUE NOTED.  VALUE IS CONSISTENT WITH PREVIOUSLY REPORTED AND CALLED VALUE.    Culture (Lajuan Godbee)  Final    ESCHERICHIA COLI SUSCEPTIBILITIES PERFORMED ON PREVIOUS CULTURE WITHIN THE LAST 5 DAYS. Performed at [facility], [address] [place], [place] [address]    Report Status 07/26/2023 FINAL  Final  Blood culture (routine x 2)     Status: Abnormal   Collection Time: 07/23/23  6:22 PM   Specimen: BLOOD  Result Value Ref Range Status   Specimen Description BLOOD BLOOD LEFT ARM  Final   Special Requests   Final    BOTTLES DRAWN AEROBIC AND ANAEROBIC Blood Culture adequate volume   Culture  Setup Time   Final    GRAM NEGATIVE RODS IN BOTH AEROBIC AND ANAEROBIC BOTTLES CRITICAL RESULT CALLED TO, READ BACK BY AND VERIFIED WITH: PHARMD G. ABBOTT 07/24/2023 @ 0658 BY AB Performed at [facility], [address], [place], [place] [address]    Culture ESCHERICHIA COLI (Astella Desir)  Final   Report Status 07/26/2023 FINAL  Final   Organism ID, Bacteria ESCHERICHIA COLI  Final   Organism ID, Bacteria ESCHERICHIA COLI  Final      Susceptibility   Escherichia coli - KIRBY BAUER*    CEFAZOLIN SENSITIVE Sensitive    Escherichia coli - MIC*    AMPICILLIN <=2 SENSITIVE Sensitive     CEFEPIME <=0.12 SENSITIVE Sensitive     CEFTAZIDIME <=1 SENSITIVE Sensitive     CEFTRIAXONE <=0.25 SENSITIVE Sensitive     CIPROFLOXACIN <=0.25 SENSITIVE Sensitive     GENTAMICIN <=1 SENSITIVE Sensitive     IMIPENEM <=0.25 SENSITIVE Sensitive     TRIMETH/SULFA <=20 SENSITIVE Sensitive     AMPICILLIN/SULBACTAM <=2 SENSITIVE Sensitive     PIP/TAZO <=4 SENSITIVE Sensitive ug/mL    * ESCHERICHIA COLI    ESCHERICHIA COLI  Blood Culture ID Panel (Reflexed)     Status: Abnormal   Collection Time: 07/23/23  6:22 PM  Result Value Ref Range Status   Enterococcus  faecalis NOT DETECTED NOT DETECTED Final   Enterococcus Faecium NOT DETECTED NOT DETECTED Final   Listeria monocytogenes NOT DETECTED NOT DETECTED Final   Staphylococcus species NOT DETECTED NOT DETECTED Final   Staphylococcus aureus (BCID) NOT DETECTED NOT DETECTED Final   Staphylococcus epidermidis NOT DETECTED NOT DETECTED Final   Staphylococcus lugdunensis NOT DETECTED NOT DETECTED Final   Streptococcus species NOT DETECTED NOT DETECTED Final   Streptococcus agalactiae NOT DETECTED NOT DETECTED Final   Streptococcus pneumoniae NOT DETECTED NOT DETECTED Final   Streptococcus pyogenes NOT DETECTED NOT DETECTED Final   Tiaira Arambula.calcoaceticus-baumannii NOT DETECTED NOT DETECTED Final   Bacteroides fragilis NOT DETECTED NOT DETECTED Final   Enterobacterales DETECTED (Dajuana Palen) NOT DETECTED Final    Comment: Enterobacterales represent Shanika Levings large order of gram negative bacteria, not Dakwan Pridgen single organism. CRITICAL RESULT CALLED TO, READ BACK BY AND VERIFIED WITH: PHARMD G. ABBOTT 07/24/2023 @ 0658 BY AB    Enterobacter cloacae complex NOT DETECTED NOT DETECTED Final   Escherichia coli DETECTED (Violett Hobbs) NOT DETECTED Final    Comment: CRITICAL RESULT CALLED TO, READ BACK BY AND VERIFIED WITH: PHARMD G. ABBOTT 07/24/2023 @ 0658 BY AB    Klebsiella aerogenes NOT DETECTED NOT DETECTED Final   Klebsiella oxytoca NOT DETECTED NOT DETECTED Final   Klebsiella pneumoniae NOT DETECTED NOT DETECTED Final   Proteus species NOT DETECTED NOT DETECTED Final   Salmonella species NOT DETECTED NOT DETECTED Final   Serratia marcescens NOT DETECTED NOT DETECTED Final   Haemophilus influenzae NOT DETECTED NOT DETECTED Final   Neisseria meningitidis NOT DETECTED NOT DETECTED Final   Pseudomonas aeruginosa NOT DETECTED NOT DETECTED Final   Stenotrophomonas maltophilia NOT DETECTED NOT DETECTED Final   Candida albicans NOT DETECTED NOT DETECTED Final   Candida auris NOT DETECTED NOT DETECTED Final  Candida glabrata NOT DETECTED NOT  DETECTED Final   Candida krusei NOT DETECTED NOT DETECTED Final   Candida parapsilosis NOT DETECTED NOT DETECTED Final   Candida tropicalis NOT DETECTED NOT DETECTED Final   Cryptococcus neoformans/gattii NOT DETECTED NOT DETECTED Final   CTX-M ESBL NOT DETECTED NOT DETECTED Final   Carbapenem resistance IMP NOT DETECTED NOT DETECTED Final   Carbapenem resistance KPC NOT DETECTED NOT DETECTED Final   Carbapenem resistance NDM NOT DETECTED NOT DETECTED Final   Carbapenem resist OXA 48 LIKE NOT DETECTED NOT DETECTED Final   Carbapenem resistance VIM NOT DETECTED NOT DETECTED Final    Comment: Performed at [facility], [address] [place], [place] [address]  Urine Culture (for pregnant, neutropenic or urologic patients or patients with an indwelling urinary catheter)     Status: Abnormal   Collection Time: 07/24/23  1:19 AM   Specimen: Urine, Clean Catch  Result Value Ref Range Status   Specimen Description URINE, CLEAN CATCH  Final   Special Requests   Final    NONE Performed at [facility], [address] [place], [place] [address]    Culture MULTIPLE SPECIES PRESENT, SUGGEST RECOLLECTION (Ellen Goris)  Final   Report Status 2023/08/06 FINAL  Final         Radiology Studies: EEG adult  Result Date: 08/06/23 Charlsie Quest, MD     08-06-23  5:24 PM Patient Name: Rodnika Hermoso MRN: [id number] Epilepsy Attending: Charlsie Quest Referring Physician/Provider: Zigmund Daniel., MD Date: 2023-08-06 Duration: 22.28 mins Patient history: 62yo F with ams getting eeg to evaluate for seizure Level of alertness: Awake, asleep AEDs during EEG study: Xanax, LEV, LTG, GBP, primidone Technical aspects: This EEG study was done with scalp electrodes positioned according to the 10-20 International system of electrode placement. Electrical activity was reviewed with band pass filter of 1-70Hz , sensitivity of 7 uV/mm, display speed of 57mm/sec with Zaleigh Bermingham 60Hz  notched filter applied as  appropriate. EEG data were recorded continuously and digitally stored.  Video monitoring was available and reviewed as appropriate. Description: The posterior dominant rhythm consists of 8-9 Hz activity of moderate voltage (25-35 uV) seen predominantly in posterior head regions, symmetric and reactive to eye opening and eye closing. Sleep was characterized by vertex waves, sleep spindles (12 to 14 Hz), maximal frontocentral region. Physiologic photic driving was seen during photic stimulation. Hyperventilation was not performed.   IMPRESSION: This study is within normal limits. No seizures or epileptiform discharges were seen throughout the recording. Hellen Shanley normal interictal EEG does not exclude the diagnosis of epilepsy. Valerie [name]        Scheduled Meds:  carbidopa-levodopa  1 tablet Oral TID WC   Chlorhexidine Gluconate Cloth  6 each Topical Daily   donepezil  10 mg Oral Daily   enoxaparin (LOVENOX) injection  40 mg Subcutaneous Daily   FLUoxetine  40 mg Oral Daily   gabapentin  100 mg Oral QHS   lamoTRIgine  150 mg Oral BID   levETIRAcetam  1,000 mg Oral BID   memantine  10 mg Oral BID   pantoprazole  40 mg Oral Daily   predniSONE  5 mg Oral BID WC   primidone  100 mg Oral TID   rosuvastatin  10 mg Oral Daily   traZODone  200 mg Oral QHS   triamcinolone cream   Topical BID   Continuous Infusions:   ceFAZolin (ANCEF) IV       LOS: 3 days  Time spent: over 30 min    Lacretia Nicks, MD [facility]   To contact the attending provider between 7A-7P or the covering provider during after hours 7P-7A, please log into the web site [email or web address] and access using universal [place] password for that web site. If you do not have the password, please call the hospital operator.  07/26/2023, 1:38 PM

## 2023-07-26 NOTE — Progress Notes (Signed)
Physical Therapy Treatment Patient Details Name: Valerie [name] MRN: [id number] DOB: 31-Mar-1961 Today's Date: 07/26/2023   History of Present Illness 62 y.o. female presents to ED 11/29 after a fall reporting ongoing R flank pain. Temperature 102.6.  BP 92/54, pulse 84 UA was positive for pyuria. Started on  IV antibiotics  WUJ:WJXBJYN disorder, bipolar disorder, TIA, Parkinson's disease, chronic urinary retention, CKD 3B, recently diagnosed dementia    PT Comments  Pt supine in bed on entry, tearful, stating doctor and daughter would not let her go home. PT reports agreement, but also encouraged pt to get up and see how she is doing today. Pt is doing better but still needs min A for mobility and lives alone. Pt with increased fatigue by the time she walks around the foot of the bed. Pt reluctantly agrees that she probably should go to rehab for a couple weeks before going home. Pt request SNF that is closer to her daughter in [place]. PT will continue to follow acutely.     If plan is discharge home, recommend the following: A lot of help with walking and/or transfers;A lot of help with bathing/dressing/bathroom;Assistance with cooking/housework   Can travel by private vehicle     No  Equipment Recommendations  None recommended by PT       Precautions / Restrictions Precautions Precautions: Fall Precaution Comments: fell down flight of stairs     Mobility  Bed Mobility Overal bed mobility: Modified Independent             General bed mobility comments: use of bedrails and HoB elevated, assist for management of foley bag    Transfers Overall transfer level: Needs assistance Equipment used: Rolling walker (2 wheels), 1 person hand held assist Transfers: Sit to/from Stand, Bed to chair/wheelchair/BSC Sit to Stand: From elevated surface, Min assist           General transfer comment: good power up from elevated bed surface minA for steadying     Ambulation/Gait Ambulation/Gait assistance: Min assist Gait Distance (Feet): 15 Feet Assistive device: Rolling walker (2 wheels) Gait Pattern/deviations: Step-to pattern, Decreased step length - left, Decreased stance time - right, Decreased weight shift to right, Antalgic, Trunk flexed Gait velocity: slowed Gait velocity interpretation: <1.31 ft/sec, indicative of household ambulator   General Gait Details: min A for steadying, with ambulation around foot of bed to recliner, continues to be painful in R knee and side, vc for upright posture and proximity to RW, increased fatigue by the time she got to the chair      Balance Overall balance assessment: Needs assistance Sitting-balance support: Feet supported, No upper extremity supported, Bilateral upper extremity supported, Single extremity supported, Feet unsupported Sitting balance-Leahy Scale: Fair     Standing balance support: Bilateral upper extremity supported, Single extremity supported, During functional activity, Reliant on assistive device for balance Standing balance-Leahy Scale: Poor Standing balance comment: requires at least single UE support                            Cognition Arousal: Alert Behavior During Therapy: WFL for tasks assessed/performed Overall Cognitive Status: History of cognitive impairments - at baseline                                 General Comments: better safety awareness today, very tearful about not being able to go home but is also  understanding of why she is not safe to go home yet           General Comments General comments (skin integrity, edema, etc.): VSS on RA, no notable tremors with moving today      Pertinent Vitals/Pain Pain Assessment Pain Assessment: Faces Faces Pain Scale: Hurts little more Pain Location: R knee and hip greatest pain but also head, R flank and back Pain Descriptors / Indicators: Sharp, Headache, Aching, Sore Pain  Intervention(s): Limited activity within patient's tolerance, Monitored during session, Repositioned     PT Goals (current goals can now be found in the care plan section) Acute Rehab PT Goals PT Goal Formulation: With patient/family Time For Goal Achievement: 08/07/23 Potential to Achieve Goals: Good Progress towards PT goals: Progressing toward goals    Frequency    Min 1X/week       AM-PAC PT "6 Clicks" Mobility   Outcome Measure  Help needed turning from your back to your side while in a flat bed without using bedrails?: A Little Help needed moving from lying on your back to sitting on the side of a flat bed without using bedrails?: A Little Help needed moving to and from a bed to a chair (including a wheelchair)?: A Lot Help needed standing up from a chair using your arms (e.g., wheelchair or bedside chair)?: A Lot Help needed to walk in hospital room?: A Lot Help needed climbing 3-5 steps with a railing? : Total 6 Click Score: 13    End of Session Equipment Utilized During Treatment: Gait belt Activity Tolerance: Patient limited by pain;Patient limited by fatigue Patient left: in chair;with call bell/phone within reach;with chair alarm set Nurse Communication: Mobility status PT Visit Diagnosis: Unsteadiness on feet (R26.81);Repeated falls (R29.6);Muscle weakness (generalized) (M62.81);Other abnormalities of gait and mobility (R26.89);Difficulty in walking, not elsewhere classified (R26.2)     Time: 9528-4132 PT Time Calculation (min) (ACUTE ONLY): 31 min  Charges:    $Gait Training: 8-22 mins $Therapeutic Activity: 8-22 mins PT General Charges $$ ACUTE PT VISIT: 1 Visit                     Tanishka Drolet B. Beverely Risen PT, DPT Acute Rehabilitation Services Please use secure chat or  Call Office [phone]    [facility]on [facility] 07/26/2023, 3:28 PM

## 2023-07-27 ENCOUNTER — Inpatient Hospital Stay (HOSPITAL_COMMUNITY): Payer: Medicare Other

## 2023-07-27 DIAGNOSIS — N12 Tubulo-interstitial nephritis, not specified as acute or chronic: Secondary | ICD-10-CM | POA: Diagnosis not present

## 2023-07-27 LAB — CBC WITH DIFFERENTIAL/PLATELET
Abs Immature Granulocytes: 0.03 10*3/uL (ref 0.00–0.07)
Basophils Absolute: 0 10*3/uL (ref 0.0–0.1)
Basophils Relative: 0 %
Eosinophils Absolute: 0.3 10*3/uL (ref 0.0–0.5)
Eosinophils Relative: 4 %
HCT: 33 % — ABNORMAL LOW (ref 36.0–46.0)
Hemoglobin: 10.8 g/dL — ABNORMAL LOW (ref 12.0–15.0)
Immature Granulocytes: 0 %
Lymphocytes Relative: 13 %
Lymphs Abs: 0.9 10*3/uL (ref 0.7–4.0)
MCH: 31.6 pg (ref 26.0–34.0)
MCHC: 32.7 g/dL (ref 30.0–36.0)
MCV: 96.5 fL (ref 80.0–100.0)
Monocytes Absolute: 0.9 10*3/uL (ref 0.1–1.0)
Monocytes Relative: 13 %
Neutro Abs: 4.9 10*3/uL (ref 1.7–7.7)
Neutrophils Relative %: 70 %
Platelets: 178 10*3/uL (ref 150–400)
RBC: 3.42 MIL/uL — ABNORMAL LOW (ref 3.87–5.11)
RDW: 13.3 % (ref 11.5–15.5)
WBC: 7 10*3/uL (ref 4.0–10.5)
nRBC: 0 % (ref 0.0–0.2)

## 2023-07-27 LAB — COMPREHENSIVE METABOLIC PANEL
ALT: 11 U/L (ref 0–44)
AST: 15 U/L (ref 15–41)
Albumin: 2.7 g/dL — ABNORMAL LOW (ref 3.5–5.0)
Alkaline Phosphatase: 100 U/L (ref 38–126)
Anion gap: 11 (ref 5–15)
BUN: 17 mg/dL (ref 8–23)
CO2: 25 mmol/L (ref 22–32)
Calcium: 8.7 mg/dL — ABNORMAL LOW (ref 8.9–10.3)
Chloride: 101 mmol/L (ref 98–111)
Creatinine, Ser: 1.28 mg/dL — ABNORMAL HIGH (ref 0.44–1.00)
GFR, Estimated: 47 mL/min — ABNORMAL LOW (ref >60)
Glucose, Bld: 99 mg/dL (ref 70–99)
Potassium: 3.8 mmol/L (ref 3.5–5.1)
Sodium: 137 mmol/L (ref 135–145)
Total Bilirubin: 0.3 mg/dL (ref <1.2)
Total Protein: 5.4 g/dL — ABNORMAL LOW (ref 6.5–8.1)

## 2023-07-27 LAB — PHOSPHORUS: Phosphorus: 2.6 mg/dL (ref 2.5–4.6)

## 2023-07-27 LAB — MAGNESIUM: Magnesium: 1.6 mg/dL — ABNORMAL LOW (ref 1.7–2.4)

## 2023-07-27 MED ORDER — BISACODYL 10 MG RE SUPP: 10.0000 mg | Freq: Every day | RECTAL | Status: DC | PRN

## 2023-07-27 MED ORDER — MAGNESIUM SULFATE 2 GM/50ML IV SOLN
2.0000 g | Freq: Once | INTRAVENOUS | Status: AC
  Administered 2023-07-27: 2 g via INTRAVENOUS
  Filled 2023-07-27: qty 50

## 2023-07-27 MED ORDER — CEFADROXIL 500 MG PO CAPS
1000.0000 mg | ORAL_CAPSULE | Freq: Two times a day (BID) | ORAL | Status: DC
  Administered 2023-07-27 – 2023-07-28 (×3): 1000 mg via ORAL
  Filled 2023-07-27 (×4): qty 2

## 2023-07-27 MED ORDER — POLYETHYLENE GLYCOL 3350 17 G PO PACK
17.0000 g | PACK | Freq: Two times a day (BID) | ORAL | Status: DC
  Administered 2023-07-27 – 2023-07-28 (×2): 17 g via ORAL
  Filled 2023-07-27 (×2): qty 1

## 2023-07-27 NOTE — TOC Progression Note (Signed)
Transition of Care [facility]) - Progression Note    Patient Details  Name: Valerie [name] MRN: [id number] Date of Birth: Nov 19, 1960  Transition of [facility]) CM/SW Contact  Erin Sons, [place] Phone Number: 07/27/2023, 1:43 PM  Clinical Narrative:     CSW met with pt and provided SNF bed offers. Pt requests CSW contact her daughter.   CSW contacted daughter and discussed bed offers and their medicare star ratings. D[facility]  CSW confirmed bed with [facility].  SNF auth is pending PASRR pending  Expected Discharge Plan: Skilled Nursing Facility Barriers to Discharge: Insurance Authorization  Expected Discharge Plan and Services       Living arrangements for the past 2 months: Apartment                                       Social Determinants of Health (SDOH) Interventions SDOH Screenings   Food Insecurity: No Food Insecurity (07/24/2023)  Housing: Low Risk  (07/24/2023)  Transportation Needs: No Transportation Needs (07/24/2023)  Utilities: Not At Risk (07/24/2023)  Alcohol Screen: Low Risk  (12/04/2022)  Depression (PHQ2-9): Medium Risk (07/20/2023)  Financial Resource Strain: High Risk (01/11/2023)  Physical Activity: Inactive (07/20/2023)  Social Connections: Socially Isolated (12/04/2022)  Stress: Stress Concern Present (07/20/2023)  Tobacco Use: High Risk (07/23/2023)    Readmission Risk Interventions     No data to display

## 2023-07-27 NOTE — Plan of Care (Signed)
Pt alert and oriented x 4. Complains of shoulder pain, see MAR. IV was replaced this shift. Continues IV ABX. Continent of bowel and bladder. Ambulates in room to bedside commode. Sister to bedside this shift. Continues working with PT/OT. Will continue with current plan of care. Problem: Education: Goal: Knowledge of General Education information will improve Description: Including pain rating scale, medication(s)/side effects and non-pharmacologic comfort measures Outcome: Progressing   Problem: Health Behavior/Discharge Planning: Goal: Ability to manage health-related needs will improve Outcome: Progressing   Problem: Clinical Measurements: Goal: Ability to maintain clinical measurements within normal limits will improve Outcome: Progressing Goal: Will remain free from infection Outcome: Progressing Goal: Diagnostic test results will improve Outcome: Progressing Goal: Respiratory complications will improve Outcome: Progressing Goal: Cardiovascular complication will be avoided Outcome: Progressing   Problem: Activity: Goal: Risk for activity intolerance will decrease Outcome: Progressing   Problem: Nutrition: Goal: Adequate nutrition will be maintained Outcome: Progressing   Problem: Coping: Goal: Level of anxiety will decrease Outcome: Progressing   Problem: Elimination: Goal: Will not experience complications related to bowel motility Outcome: Progressing Goal: Will not experience complications related to urinary retention Outcome: Progressing   Problem: Pain Management: Goal: General experience of comfort will improve Outcome: Progressing   Problem: Safety: Goal: Ability to remain free from injury will improve Outcome: Progressing   Problem: Skin Integrity: Goal: Risk for impaired skin integrity will decrease Outcome: Progressing

## 2023-07-27 NOTE — Care Management Important Message (Signed)
Important Message  Patient Details  Name: Valerie [name] MRN: [id number] Date of Birth: Oct 08, 1960   Important Message Given:  Yes - Medicare IM     Dorena Bodo 07/27/2023, 3:39 PM

## 2023-07-27 NOTE — Progress Notes (Addendum)
PROGRESS NOTE    Valerie [name]  VWU:[id number] DOB: 23-Dec-1960 DOA: 07/23/2023 PCP: Myrlene Broker, MD  Chief Complaint  Patient presents with   Fall    Brief Narrative:   Valerie [name] is Valerie [name] 62 y.o. female with medical history significant for seizure disorder, bipolar disorder, TIA, Parkinson's disease, chronic urinary retention, CKD 3B, who presents to the ED after Valerie [name] fall.  Endorses being compliant with her seizure medications.  Reports right flank pain for the past few days.   In the ED, EDP discussed the case with neurology regarding her seizure disorder (with concern that her fall may have been related to Braxley Balandran seizure) recommended to increase home Keppra dose to 1000 twice daily.  The patient was noted to be febrile in the ED with Tmax 102.6.  Foley catheter placed due to urinary retention.  UA was positive for pyuria and CT concerning for pyelo.  The patient was started on empiric IV antibiotics.  Admitted by [facility], hospitalist service.  Assessment & Plan:   Principal Problem:   Pyelonephritis  Stable for SNF when bed available    E. 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  Seems like she has history of chronic urinary retention and has had foley for up to 2 months before (this may have been related to  Foley in place - not clear how much she was retaining, will try to figure that out (first documented out was 825? Though not clear this represents what came out with catheterization) discontinue oxybutynin -> will do voiding trial 12/3.  If retaining, will need to replace.   She should have urology follow after discharge   Fall  Generalized Weakness Parkinson's Disease 4 falls in day prior to presentation - concern that seizure  caused first fall? CT with soft tissue stranding along the posterior right upper quadrant and posterior left upper quadrant.  No acute traumatic injury in chest. CT right knee without evidence of acute fracture or dislocation.  CT head/C spine without fracture or acute finding.  PT/OT Sinemet for parkinsons  Stroke Like Symptoms Seizure Like Activity She reports neurologic symptoms over the past few weeks including two "TIA's" and Dakiya Puopolo seizure Wednesday.  Will get EEG and MRI and follow results.  Consider neurology c/s if observed event or positive study.  EEG within normal limits MRI without acute intracranial process Should have neurology follow up outpatient   Seizure Disorder Per EDP note, with first fall, she thinks she had myoclonic seizure (11/29 note by Dr. Jarold Motto) Keppra increased to 1000 mg BID per neurology (discussed per EDP with neurology - see 11/29 note from Dr. Jarold Motto under clinical course)  Per [facility] statutes, patients with seizures are not allowed to drive until  they have been seizure-free for six months. Use caution when using heavy equipment or power tools. Avoid working on ladders or at heights. Take showers instead of baths. Ensure the water temperature is not too high on the home water heater. Do not go swimming alone. When caring for infants or small children, sit down when holding, feeding, or changing them to minimize risk of injury to the child in the event you have Berneice Zettlemoyer seizure. Also, Maintain good sleep hygiene. Avoid alcohol.  Abdominal Discomfort Some probably related to fall, but  she notes different discomfort today she thinks is constipation Bowel regimen  Low threshold for repeat imaging  CKD IIIB Appears close to baseline, will trend CT without hydro  Acute Metabolic encephalopathy Resolved (noted at presentation)  Hyperlipidemia Crestor  Mood Disorder Continue home meds  Obesity Body mass index is 36.29 kg/m.    DVT  prophylaxis: lovenox Code Status: full Family Communication: friend at bedside Disposition:   Status is: Inpatient Remains inpatient appropriate because: continued need for inpatient care   Consultants:  none  Procedures:  EEG IMPRESSION: This study is within normal limits. No seizures or epileptiform discharges were seen throughout the recording.   Peirce Deveney normal interictal EEG does not exclude the diagnosis of epilepsy.   [name]   Antimicrobials:  Anti-infectives (From admission, onward)    Start     Dose/Rate Route Frequency Ordered Stop   07/27/23 1000  cefadroxil (DURICEF) capsule 1,000 mg        1,000 mg Oral 2 times daily 07/27/23 0830 08/01/23 0959   07/26/23 1400  ceFAZolin (ANCEF) IVPB 2g/100 mL premix  Status:  Discontinued        2 g 200 mL/hr over 30 Minutes Intravenous Every 8 hours 07/26/23 1052 07/26/23 1053   07/26/23 1400  ceFAZolin (ANCEF) IVPB 2g/100 mL premix  Status:  Discontinued       Note to Pharmacy: Retime as needed based on last ceftriaxone dose   2 g 200 mL/hr over 30 Minutes Intravenous Every 8 hours 07/26/23 1053 07/27/23 0830   07/24/23 0600  cefTRIAXone (ROCEPHIN) 2 g in sodium chloride 0.9 % 100 mL IVPB  Status:  Discontinued        2 g 200 mL/hr over 30 Minutes Intravenous Daily 07/24/23 0117 07/26/23 1052   07/23/23 1745  cefTRIAXone (ROCEPHIN) 1 g in sodium chloride 0.9 % 100 mL IVPB        1 g 200 mL/hr over 30 Minutes Intravenous  Once 07/23/23 1742 07/23/23 1921       Subjective: Thinks she maybe constipated with abdominal discomfort This is different than traumatic pain she presented with   Objective: Vitals:   07/26/23 1930 07/27/23 0005 07/27/23 0425 07/27/23 0839  BP: 122/64 108/68 110/69 (!) 114/57  Pulse: (!) 50 (!) 55 (!) 57 (!) 53  Resp: 16 18 19 16   Temp: 98.1 F (36.7 C) 98.5 F (36.9 C) 97.7 F (36.5 C) 97.6 F (36.4 C)  TempSrc: Oral Oral Oral Oral  SpO2: 95% 94% 92% 96%  Weight:      Height:         Intake/Output Summary (Last 24 hours) at 07/27/2023 1143 Last data filed at 07/27/2023 0755 Gross per 24 hour  Intake 859.91 ml  Output 3450 ml  Net -2590.09 ml   Filed Weights   07/24/23 0115  Weight: 90 kg    Examination:  General: No acute distress. Cardiovascular: RRR Lungs: unlabored Abdomen: bruising to R abdomen improved, continued TTP along right side Neurological: Alert and oriented 3. Moves all extremities 4 with equal strength. Cranial nerves II through XII grossly intact. Extremities: No clubbing or cyanosis. No edema.  Data Reviewed: I have personally reviewed following labs and imaging studies  CBC: Recent Labs  Lab 07/23/23 1328 07/23/23 1351 07/24/23 0422 07/25/23 0743 07/27/23 0522  WBC 16.4*  --  18.1* 9.5 7.0  NEUTROABS  --   --   --  7.7 4.9  HGB 11.7* 11.6* 10.1* 10.1* 10.8*  HCT 36.1 34.0*  31.6* 31.1* 33.0*  MCV 100.8*  --  100.3* 98.1 96.5  PLT 173  --  138* 136* 178    Basic Metabolic Panel: Recent Labs  Lab 07/23/23 1328 07/23/23 1351 07/24/23 0422 07/25/23 0743 07/27/23 0522  NA 139 138 136 140 137  K 3.7 4.2 4.4 4.4 3.8  CL 105 104 106 110 101  CO2 24  --  21* 24 25  GLUCOSE 151* 147* 146* 104* 99  BUN 27* 39* 22 15 17   CREATININE 1.57* 1.50* 1.56* 1.72* 1.28*  CALCIUM 8.3*  --  7.6* 7.9* 8.7*  MG  --   --  1.7  --  1.6*  PHOS  --   --  3.5  --  2.6    GFR: Estimated Creatinine Clearance: 46.9 mL/min (Didi Ganaway) (by C-G formula based on SCr of 1.28 mg/dL (H)).  Liver Function Tests: Recent Labs  Lab 07/23/23 1328 07/27/23 0522  AST 16 15  ALT <5 11  ALKPHOS 76 100  BILITOT 0.5 0.3  PROT 5.2* 5.4*  ALBUMIN 3.0* 2.7*    CBG: Recent Labs  Lab 07/26/23 2228  GLUCAP 106*     Recent Results (from the past 240 hour(s))  Blood culture (routine x 2)     Status: Abnormal   Collection Time: 07/23/23  6:12 PM   Specimen: BLOOD  Result Value Ref Range Status   Specimen Description BLOOD BLOOD RIGHT ARM  Final    Special Requests   Final    BOTTLES DRAWN AEROBIC AND ANAEROBIC Blood Culture adequate volume   Culture  Setup Time   Final    GRAM NEGATIVE RODS IN BOTH AEROBIC AND ANAEROBIC BOTTLES CRITICAL VALUE NOTED.  VALUE IS CONSISTENT WITH PREVIOUSLY REPORTED AND CALLED VALUE.    Culture (Donte Kary)  Final    ESCHERICHIA COLI SUSCEPTIBILITIES PERFORMED ON PREVIOUS CULTURE WITHIN THE LAST 5 DAYS. Performed at [facility], [address], [place], [place] [address]    Report Status 07/26/2023 FINAL  Final  Blood culture (routine x 2)     Status: Abnormal   Collection Time: 07/23/23  6:22 PM   Specimen: BLOOD  Result Value Ref Range Status   Specimen Description BLOOD BLOOD LEFT ARM  Final   Special Requests   Final    BOTTLES DRAWN AEROBIC AND ANAEROBIC Blood Culture adequate volume   Culture  Setup Time   Final    GRAM NEGATIVE RODS IN BOTH AEROBIC AND ANAEROBIC BOTTLES CRITICAL RESULT CALLED TO, READ BACK BY AND VERIFIED WITH: PHARMD G. ABBOTT 07/24/2023 @ 0658 BY AB Performed at [facility], [address], [place], [place] [address]    Culture ESCHERICHIA COLI (Kazandra Forstrom)  Final   Report Status 07/26/2023 FINAL  Final   Organism ID, Bacteria ESCHERICHIA COLI  Final   Organism ID, Bacteria ESCHERICHIA COLI  Final      Susceptibility   Escherichia coli - KIRBY BAUER*    CEFAZOLIN SENSITIVE Sensitive    Escherichia coli - MIC*    AMPICILLIN <=2 SENSITIVE Sensitive     CEFEPIME <=0.12 SENSITIVE Sensitive     CEFTAZIDIME <=1 SENSITIVE Sensitive     CEFTRIAXONE <=0.25 SENSITIVE Sensitive     CIPROFLOXACIN <=0.25 SENSITIVE Sensitive     GENTAMICIN <=1 SENSITIVE Sensitive     IMIPENEM <=0.25 SENSITIVE Sensitive     TRIMETH/SULFA <=20 SENSITIVE Sensitive     AMPICILLIN/SULBACTAM <=2 SENSITIVE Sensitive     PIP/TAZO <=4 SENSITIVE Sensitive ug/mL    *  ESCHERICHIA COLI    ESCHERICHIA COLI  Blood Culture ID Panel (Reflexed)     Status: Abnormal   Collection Time: 07/23/23  6:22 PM  Result  Value Ref Range Status   Enterococcus faecalis NOT DETECTED NOT DETECTED Final   Enterococcus Faecium NOT DETECTED NOT DETECTED Final   Listeria monocytogenes NOT DETECTED NOT DETECTED Final   Staphylococcus species NOT DETECTED NOT DETECTED Final   Staphylococcus aureus (BCID) NOT DETECTED NOT DETECTED Final   Staphylococcus epidermidis NOT DETECTED NOT DETECTED Final   Staphylococcus lugdunensis NOT DETECTED NOT DETECTED Final   Streptococcus species NOT DETECTED NOT DETECTED Final   Streptococcus agalactiae NOT DETECTED NOT DETECTED Final   Streptococcus pneumoniae NOT DETECTED NOT DETECTED Final   Streptococcus pyogenes NOT DETECTED NOT DETECTED Final   Emilyrose Darrah.calcoaceticus-baumannii NOT DETECTED NOT DETECTED Final   Bacteroides fragilis NOT DETECTED NOT DETECTED Final   Enterobacterales DETECTED (Kartik Fernando) NOT DETECTED Final    Comment: Enterobacterales represent Bralen Wiltgen large order of gram negative bacteria, not [place] single organism. CRITICAL RESULT CALLED TO, READ BACK BY AND VERIFIED WITH: PHARMD G. ABBOTT 07/24/2023 @ 0658 BY AB    Enterobacter cloacae complex NOT DETECTED NOT DETECTED Final   Escherichia coli DETECTED (Laurieann Friddle) NOT DETECTED Final    Comment: CRITICAL RESULT CALLED TO, READ BACK BY AND VERIFIED WITH: PHARMD G. ABBOTT 07/24/2023 @ 0658 BY AB    Klebsiella aerogenes NOT DETECTED NOT DETECTED Final   Klebsiella oxytoca NOT DETECTED NOT DETECTED Final   Klebsiella pneumoniae NOT DETECTED NOT DETECTED Final   Proteus species NOT DETECTED NOT DETECTED Final   Salmonella species NOT DETECTED NOT DETECTED Final   Serratia marcescens NOT DETECTED NOT DETECTED Final   Haemophilus influenzae NOT DETECTED NOT DETECTED Final   Neisseria meningitidis NOT DETECTED NOT DETECTED Final   Pseudomonas aeruginosa NOT DETECTED NOT DETECTED Final   Stenotrophomonas maltophilia NOT DETECTED NOT DETECTED Final   Candida albicans NOT DETECTED NOT DETECTED Final   Candida auris NOT DETECTED NOT DETECTED Final    Candida glabrata NOT DETECTED NOT DETECTED Final   Candida krusei NOT DETECTED NOT DETECTED Final   Candida parapsilosis NOT DETECTED NOT DETECTED Final   Candida tropicalis NOT DETECTED NOT DETECTED Final   Cryptococcus neoformans/gattii NOT DETECTED NOT DETECTED Final   CTX-M ESBL NOT DETECTED NOT DETECTED Final   Carbapenem resistance IMP NOT DETECTED NOT DETECTED Final   Carbapenem resistance KPC NOT DETECTED NOT DETECTED Final   Carbapenem resistance NDM NOT DETECTED NOT DETECTED Final   Carbapenem resist OXA 48 LIKE NOT DETECTED NOT DETECTED Final   Carbapenem resistance VIM NOT DETECTED NOT DETECTED Final    Comment: Performed at [facility], [address] [place], [place] [address]  Urine Culture (for pregnant, neutropenic or urologic patients or patients with an indwelling urinary catheter)     Status: Abnormal   Collection Time: 07/24/23  1:19 AM   Specimen: Urine, Clean Catch  Result Value Ref Range Status   Specimen Description URINE, CLEAN CATCH  Final   Special Requests   Final    NONE Performed at [facility], [address] [place], [place] [address]    Culture MULTIPLE SPECIES PRESENT, SUGGEST RECOLLECTION (Denzil Mceachron)  Final   Report Status 07/25/2023 FINAL  Final         Radiology Studies: MR BRAIN WO CONTRAST  Result Date: 07/27/2023 CLINICAL DATA:  Neuro deficit, acute, stroke suspected EXAM: MRI HEAD WITHOUT CONTRAST TECHNIQUE: Multiplanar, multiecho pulse sequences  of the brain and surrounding structures were obtained without intravenous contrast. COMPARISON:  Head CT 07/23/2023 FINDINGS: Brain: Negative for an acute infarct. No hemorrhage. No hydrocephalus. No extra-axial fluid collection. No mass effect. No mass lesion. There is Beaux Wedemeyer background of mild chronic microvascular ischemic change. Enlarged and partially empty sella. Vascular: Normal flow voids. Skull and upper cervical spine: Normal marrow signal. Sinuses/Orbits: No middle ear or mastoid  effusion. Paranasal sinuses are clear. Bilateral lens replacement. Orbits are otherwise unremarkable. Other: None. IMPRESSION: No acute intracranial process. Electronically Signed   By: Lorenza Cambridge M.D.   On: 07/27/2023 10:48   EEG adult  Result Date: 07/25/2023 Charlsie Quest, MD     07/25/2023  5:24 PM Patient Name: Naveen Verdun MRN: [id number] Epilepsy Attending: Charlsie Quest Referring Physician/Provider: Zigmund Daniel., MD Date: 07/25/2023 Duration: 22.28 mins Patient history: 62yo F with ams getting eeg to evaluate for seizure Level of alertness: Awake, asleep AEDs during EEG study: Xanax, LEV, LTG, GBP, primidone Technical aspects: This EEG study was done with scalp electrodes positioned according to the 10-20 International system of electrode placement. Electrical activity was reviewed with band pass filter of 1-70Hz , sensitivity of 7 uV/mm, display speed of 64mm/sec with Damonie Furney 60Hz  notched filter applied as appropriate. EEG data were recorded continuously and digitally stored.  Video monitoring was available and reviewed as appropriate. Description: The posterior dominant rhythm consists of 8-9 Hz activity of moderate voltage (25-35 uV) seen predominantly in posterior head regions, symmetric and reactive to eye opening and eye closing. Sleep was characterized by vertex waves, sleep spindles (12 to 14 Hz), maximal frontocentral region. Physiologic photic driving was seen during photic stimulation. Hyperventilation was not performed.   IMPRESSION: This study is within normal limits. No seizures or epileptiform discharges were seen throughout the recording. Caleigh Rabelo normal interictal EEG does not exclude the diagnosis of epilepsy. [name]        Scheduled Meds:  carbidopa-levodopa  1 tablet Oral TID WC   cefadroxil  1,000 mg Oral BID   Chlorhexidine Gluconate Cloth  6 each Topical Daily   donepezil  10 mg Oral Daily   enoxaparin (LOVENOX) injection  40 mg Subcutaneous Daily    FLUoxetine  40 mg Oral Daily   gabapentin  100 mg Oral QHS   lamoTRIgine  150 mg Oral BID   levETIRAcetam  1,000 mg Oral BID   memantine  10 mg Oral BID   pantoprazole  40 mg Oral Daily   polyethylene glycol  17 g Oral BID   predniSONE  5 mg Oral BID WC   primidone  100 mg Oral TID   rosuvastatin  10 mg Oral Daily   traZODone  200 mg Oral QHS   triamcinolone cream   Topical BID   Continuous Infusions:     LOS: 4 days    Time spent: over 30 min    Lacretia Nicks, MD [facility]   To contact the attending provider between 7A-7P or the covering provider during after hours 7P-7A, please log into the web site [email or web address] and access using universal [place] password for that web site. If you do not have the password, please call the hospital operator.  07/27/2023, 11:43 AM

## 2023-07-28 DIAGNOSIS — N1832 Chronic kidney disease, stage 3b: Secondary | ICD-10-CM | POA: Diagnosis not present

## 2023-07-28 DIAGNOSIS — R8281 Pyuria: Secondary | ICD-10-CM | POA: Diagnosis not present

## 2023-07-28 DIAGNOSIS — R251 Tremor, unspecified: Secondary | ICD-10-CM | POA: Diagnosis not present

## 2023-07-28 DIAGNOSIS — E785 Hyperlipidemia, unspecified: Secondary | ICD-10-CM | POA: Diagnosis not present

## 2023-07-28 DIAGNOSIS — R7881 Bacteremia: Secondary | ICD-10-CM

## 2023-07-28 DIAGNOSIS — I252 Old myocardial infarction: Secondary | ICD-10-CM | POA: Diagnosis not present

## 2023-07-28 DIAGNOSIS — F17210 Nicotine dependence, cigarettes, uncomplicated: Secondary | ICD-10-CM | POA: Diagnosis not present

## 2023-07-28 DIAGNOSIS — A419 Sepsis, unspecified organism: Secondary | ICD-10-CM | POA: Diagnosis not present

## 2023-07-28 DIAGNOSIS — G4733 Obstructive sleep apnea (adult) (pediatric): Secondary | ICD-10-CM | POA: Diagnosis not present

## 2023-07-28 DIAGNOSIS — Z23 Encounter for immunization: Secondary | ICD-10-CM | POA: Diagnosis not present

## 2023-07-28 DIAGNOSIS — G20C Parkinsonism, unspecified: Secondary | ICD-10-CM | POA: Diagnosis not present

## 2023-07-28 DIAGNOSIS — I10 Essential (primary) hypertension: Secondary | ICD-10-CM | POA: Diagnosis not present

## 2023-07-28 DIAGNOSIS — G40909 Epilepsy, unspecified, not intractable, without status epilepticus: Secondary | ICD-10-CM

## 2023-07-28 DIAGNOSIS — R5381 Other malaise: Secondary | ICD-10-CM | POA: Diagnosis not present

## 2023-07-28 DIAGNOSIS — N10 Acute tubulo-interstitial nephritis: Secondary | ICD-10-CM | POA: Diagnosis not present

## 2023-07-28 DIAGNOSIS — G43E11 Chronic migraine with aura, intractable, with status migrainosus: Secondary | ICD-10-CM | POA: Diagnosis not present

## 2023-07-28 DIAGNOSIS — Z9181 History of falling: Secondary | ICD-10-CM | POA: Diagnosis not present

## 2023-07-28 DIAGNOSIS — R531 Weakness: Secondary | ICD-10-CM | POA: Diagnosis not present

## 2023-07-28 DIAGNOSIS — R079 Chest pain, unspecified: Secondary | ICD-10-CM | POA: Diagnosis not present

## 2023-07-28 DIAGNOSIS — Z7401 Bed confinement status: Secondary | ICD-10-CM | POA: Diagnosis not present

## 2023-07-28 DIAGNOSIS — I700 Atherosclerosis of aorta: Secondary | ICD-10-CM | POA: Insufficient documentation

## 2023-07-28 DIAGNOSIS — R42 Dizziness and giddiness: Secondary | ICD-10-CM | POA: Diagnosis not present

## 2023-07-28 DIAGNOSIS — N12 Tubulo-interstitial nephritis, not specified as acute or chronic: Secondary | ICD-10-CM | POA: Diagnosis not present

## 2023-07-28 DIAGNOSIS — K5900 Constipation, unspecified: Secondary | ICD-10-CM | POA: Diagnosis not present

## 2023-07-28 DIAGNOSIS — Z28310 Unvaccinated for covid-19: Secondary | ICD-10-CM | POA: Diagnosis not present

## 2023-07-28 DIAGNOSIS — G20A1 Parkinson's disease without dyskinesia, without mention of fluctuations: Secondary | ICD-10-CM | POA: Insufficient documentation

## 2023-07-28 DIAGNOSIS — B9620 Unspecified Escherichia coli [E. coli] as the cause of diseases classified elsewhere: Secondary | ICD-10-CM

## 2023-07-28 DIAGNOSIS — I129 Hypertensive chronic kidney disease with stage 1 through stage 4 chronic kidney disease, or unspecified chronic kidney disease: Secondary | ICD-10-CM | POA: Diagnosis not present

## 2023-07-28 DIAGNOSIS — K219 Gastro-esophageal reflux disease without esophagitis: Secondary | ICD-10-CM | POA: Diagnosis not present

## 2023-07-28 DIAGNOSIS — I2510 Atherosclerotic heart disease of native coronary artery without angina pectoris: Secondary | ICD-10-CM | POA: Diagnosis not present

## 2023-07-28 DIAGNOSIS — Z8673 Personal history of transient ischemic attack (TIA), and cerebral infarction without residual deficits: Secondary | ICD-10-CM | POA: Diagnosis not present

## 2023-07-28 DIAGNOSIS — R339 Retention of urine, unspecified: Secondary | ICD-10-CM | POA: Diagnosis not present

## 2023-07-28 DIAGNOSIS — E559 Vitamin D deficiency, unspecified: Secondary | ICD-10-CM | POA: Diagnosis not present

## 2023-07-28 DIAGNOSIS — N390 Urinary tract infection, site not specified: Secondary | ICD-10-CM | POA: Diagnosis not present

## 2023-07-28 HISTORY — DX: Unspecified Escherichia coli (E. coli) as the cause of diseases classified elsewhere: R78.81

## 2023-07-28 HISTORY — DX: Unspecified Escherichia coli (E. coli) as the cause of diseases classified elsewhere: B96.20

## 2023-07-28 LAB — CBC WITH DIFFERENTIAL/PLATELET
Abs Immature Granulocytes: 0.03 10*3/uL (ref 0.00–0.07)
Basophils Absolute: 0 10*3/uL (ref 0.0–0.1)
Basophils Relative: 1 %
Eosinophils Absolute: 0.2 10*3/uL (ref 0.0–0.5)
Eosinophils Relative: 3 %
HCT: 34.2 % — ABNORMAL LOW (ref 36.0–46.0)
Hemoglobin: 11.6 g/dL — ABNORMAL LOW (ref 12.0–15.0)
Immature Granulocytes: 0 %
Lymphocytes Relative: 16 %
Lymphs Abs: 1.1 10*3/uL (ref 0.7–4.0)
MCH: 32.3 pg (ref 26.0–34.0)
MCHC: 33.9 g/dL (ref 30.0–36.0)
MCV: 95.3 fL (ref 80.0–100.0)
Monocytes Absolute: 0.8 10*3/uL (ref 0.1–1.0)
Monocytes Relative: 12 %
Neutro Abs: 4.9 10*3/uL (ref 1.7–7.7)
Neutrophils Relative %: 68 %
Platelets: 205 10*3/uL (ref 150–400)
RBC: 3.59 MIL/uL — ABNORMAL LOW (ref 3.87–5.11)
RDW: 13.3 % (ref 11.5–15.5)
WBC: 7.1 10*3/uL (ref 4.0–10.5)
nRBC: 0 % (ref 0.0–0.2)

## 2023-07-28 LAB — COMPREHENSIVE METABOLIC PANEL
ALT: 8 U/L (ref 0–44)
AST: 15 U/L (ref 15–41)
Albumin: 2.8 g/dL — ABNORMAL LOW (ref 3.5–5.0)
Alkaline Phosphatase: 107 U/L (ref 38–126)
Anion gap: 10 (ref 5–15)
BUN: 16 mg/dL (ref 8–23)
CO2: 28 mmol/L (ref 22–32)
Calcium: 8.9 mg/dL (ref 8.9–10.3)
Chloride: 102 mmol/L (ref 98–111)
Creatinine, Ser: 1.34 mg/dL — ABNORMAL HIGH (ref 0.44–1.00)
GFR, Estimated: 45 mL/min — ABNORMAL LOW (ref >60)
Glucose, Bld: 91 mg/dL (ref 70–99)
Potassium: 4.2 mmol/L (ref 3.5–5.1)
Sodium: 140 mmol/L (ref 135–145)
Total Bilirubin: 0.6 mg/dL (ref <1.2)
Total Protein: 5.5 g/dL — ABNORMAL LOW (ref 6.5–8.1)

## 2023-07-28 LAB — PHOSPHORUS: Phosphorus: 3.9 mg/dL (ref 2.5–4.6)

## 2023-07-28 LAB — MAGNESIUM: Magnesium: 1.9 mg/dL (ref 1.7–2.4)

## 2023-07-28 LAB — GLUCOSE, CAPILLARY: Glucose-Capillary: 101 mg/dL — ABNORMAL HIGH (ref 70–99)

## 2023-07-28 MED ORDER — HYDROCODONE-ACETAMINOPHEN 10-325 MG PO TABS: 0.5000 | ORAL_TABLET | Freq: Four times a day (QID) | ORAL | 0 refills | Status: DC | PRN

## 2023-07-28 MED ORDER — CEFADROXIL 500 MG PO CAPS: 1000.0000 mg | ORAL_CAPSULE | Freq: Two times a day (BID) | ORAL | Status: AC

## 2023-07-28 MED ORDER — PREDNISONE 5 MG PO TABS: 5.0000 mg | ORAL_TABLET | Freq: Two times a day (BID) | ORAL | Status: DC

## 2023-07-28 MED ORDER — SENNOSIDES-DOCUSATE SODIUM 8.6-50 MG PO TABS
1.0000 | ORAL_TABLET | Freq: Two times a day (BID) | ORAL | Status: DC
  Administered 2023-07-28: 1 via ORAL
  Filled 2023-07-28: qty 1

## 2023-07-28 MED ORDER — SENNOSIDES-DOCUSATE SODIUM 8.6-50 MG PO TABS: 1.0000 | ORAL_TABLET | Freq: Two times a day (BID) | ORAL | Status: DC

## 2023-07-28 MED ORDER — GABAPENTIN 100 MG PO CAPS: 100.0000 mg | ORAL_CAPSULE | Freq: Every day | ORAL | Status: DC

## 2023-07-28 MED ORDER — ALPRAZOLAM 0.5 MG PO TABS: 0.2500 mg | ORAL_TABLET | Freq: Three times a day (TID) | ORAL | 0 refills | Status: DC | PRN

## 2023-07-28 MED ORDER — POLYETHYLENE GLYCOL 3350 17 G PO PACK: 17.0000 g | PACK | Freq: Two times a day (BID) | ORAL | Status: AC

## 2023-07-28 MED ORDER — LEVETIRACETAM 1000 MG PO TABS: 1000.0000 mg | ORAL_TABLET | Freq: Two times a day (BID) | ORAL | Status: DC

## 2023-07-28 MED ORDER — FLUOXETINE HCL 40 MG PO CAPS: 40.0000 mg | ORAL_CAPSULE | Freq: Every day | ORAL | Status: DC

## 2023-07-28 NOTE — Progress Notes (Signed)
Physical Therapy Treatment Patient Details Name: Valerie [name] MRN: [id number] DOB: [birth year] Today's Date: 07/28/2023   History of Present Illness 62 y.o. female presents to ED 11/29 after a fall reporting ongoing R flank pain. Temperature 102.6.  BP 92/54, pulse 84 UA was positive for pyuria. Started on  IV antibiotics  KGM:WNUUVOZ disorder, bipolar disorder, TIA, Parkinson's disease, chronic urinary retention, CKD 3B, recently diagnosed dementia    PT Comments  Pt and friend in room on entry. Pt looks well rested, and reports she is ready to go rehab. Pt with better awareness of her limitations and with walking in hallway asks to return to room before she becomes totally fatigued. Pt is largely min A for mobility, however inpatient therapy still warranted given need to climb 14 stairs for bed and bath.    If plan is discharge home, recommend the following: A lot of help with walking and/or transfers;A lot of help with bathing/dressing/bathroom;Assistance with cooking/housework   Can travel by private vehicle     No  Equipment Recommendations  None recommended by PT       Precautions / Restrictions Precautions Precautions: Fall Precaution Comments: fell down flight of stairs     Mobility  Bed Mobility Overal bed mobility: Modified Independent             General bed mobility comments: use of bedrails and HoB elevated, assist for management of foley bag    Transfers Overall transfer level: Needs assistance Equipment used: Rolling walker (2 wheels), 1 person hand held assist Transfers: Sit to/from Stand, Bed to chair/wheelchair/BSC Sit to Stand: Min assist           General transfer comment: good power up light min A for steadying in RW    Ambulation/Gait Ambulation/Gait assistance: Min assist Gait Distance (Feet): 60 Feet Assistive device: Rolling walker (2 wheels) Gait Pattern/deviations: Step-to pattern, Decreased step length - left, Decreased stance time -  right, Decreased weight shift to right, Antalgic, Trunk flexed Gait velocity: slowed Gait velocity interpretation: <1.31 ft/sec, indicative of household ambulator   General Gait Details: min A for steadying with RW, better posture and proximity to RW, fatigues quickly and is aware of her limitations asking to return to room          Balance Overall balance assessment: Needs assistance Sitting-balance support: Feet supported, No upper extremity supported, Bilateral upper extremity supported, Single extremity supported, Feet unsupported Sitting balance-Leahy Scale: Fair     Standing balance support: Bilateral upper extremity supported, Single extremity supported, During functional activity, Reliant on assistive device for balance Standing balance-Leahy Scale: Poor Standing balance comment: requires at least single UE support                            Cognition Arousal: Alert Behavior During Therapy: WFL for tasks assessed/performed Overall Cognitive Status: History of cognitive impairments - at baseline                                 General Comments: good awareness of need for SNF level therapy before going home, hopeful to be home before Christmas           General Comments General comments (skin integrity, edema, etc.): VSS on RA      Pertinent Vitals/Pain Pain Assessment Pain Assessment: Faces Faces Pain Scale: Hurts little more Pain Location: R flank and back Pain Descriptors /  Indicators: Aching, Sore Pain Intervention(s): Limited activity within patient's tolerance, Monitored during session, Repositioned     PT Goals (current goals can now be found in the care plan section) Acute Rehab PT Goals PT Goal Formulation: With patient/family Time For Goal Achievement: 08/07/23 Potential to Achieve Goals: Good Progress towards PT goals: Progressing toward goals    Frequency    Min 1X/week       AM-PAC PT "6 Clicks" Mobility    Outcome Measure  Help needed turning from your back to your side while in a flat bed without using bedrails?: A Little Help needed moving from lying on your back to sitting on the side of a flat bed without using bedrails?: A Little Help needed moving to and from a bed to a chair (including a wheelchair)?: A Little Help needed standing up from a chair using your arms (e.g., wheelchair or bedside chair)?: A Little Help needed to walk in hospital room?: A Lot Help needed climbing 3-5 steps with a railing? : Total 6 Click Score: 15    End of Session Equipment Utilized During Treatment: Gait belt Activity Tolerance: Patient limited by pain;Patient limited by fatigue Patient left: in chair;with call bell/phone within reach;with chair alarm set Nurse Communication: Mobility status PT Visit Diagnosis: Unsteadiness on feet (R26.81);Repeated falls (R29.6);Muscle weakness (generalized) (M62.81);Other abnormalities of gait and mobility (R26.89);Difficulty in walking, not elsewhere classified (R26.2)     Time: 8657-8469 PT Time Calculation (min) (ACUTE ONLY): 19 min  Charges:    $Gait Training: 8-22 mins PT General Charges $$ ACUTE PT VISIT: 1 Visit                     Yenni Carra B. Beverely Risen PT, DPT Acute Rehabilitation Services Please use secure chat or  Call Office [phone]    Elon Alas Fleet 07/28/2023, 3:20 PM

## 2023-07-28 NOTE — Discharge Summary (Signed)
Physician Discharge Summary   [name] MRN: [id number] DOB: 24-Mar-1961  Admit date:     07/23/2023  Discharge date: 07/28/23  Discharge Physician: Jacquelin Hawking, MD   PCP: Myrlene Broker, MD   Recommendations at discharge:  PCP follow-up Neurology follow-up  Discharge Diagnoses: Principal Problem:   Pyelonephritis Active Problems:   Anxiety   CKD (chronic kidney disease), stage III (HCC)   E coli bacteremia   Seizure disorder (HCC)   Parkinson disease (HCC)   Aortic atherosclerosis (HCC)  Resolved Problems:   * No resolved hospital problems. *  Hospital Course: Valerie [name] is a 62 y.o. female with a history of seizure disorder, bipolar disorder, TIA, Parkinson's disease, chronic urinary tension, CKD stage IIIb, hyperlipidemia.  Patient presented secondary to fall and right flank pain was found to have evidence of sepsis secondary to acute pyelonephritis and E. coli bacteremia.  Patient was treated empirically on ceftriaxone IV until culture data was available and was transition to cefadroxil.  Assessment and Plan:  Pyelonephritis Patient started empirically on Ceftriaxone with CT evidence of likely right-sided pyelonephritis. Urine culture significant for multiple species with associated bacteremia identified, likely with urinary source. Patient transitioned to Cefadroxil on discharge.  E. Coli Bacteremia Presumed secondary to urinary source. Patient managed on Ceftriaxone IV and transitioned to Cefadroxil to complete a 7-day total course of antibiotics.  Sepsis Secondary to pyelonephritis and E. coli bacteremia.  Present on admission.  Urinary retention Chronic, however patient's foley catheter was removed this admission and post-void residuals were low. Foley catheter not replaced prior to discharge. Oxybutynin discontinued.  Generalized weakness Noted.  Likely secondary to acute illness.  Patient work with physical and Occupational Therapy with  recommendations for skilled nursing facility admission on discharge.  Seizure-like activity Patient has an underlying seizure disorder and follows with neurology. Concern for possible seizures leading to recent falls. Keppra dose increased to Keppra 1000 mg BID per neurology recommendations prior to discharge.  Seizure disorder Patient increased to Keppra 1000 mg BID.  Abdominal discomfort Likely related to constipation which is likely related to opiate use. Recommend continuing bowel regimen.  CKD stage IIIb Stable.  Acute metabolic encephalopathy Secondary to acute infection. Resolved.  Hyperlipidemia Aortic atherosclerosis Continue Crestor  Mood disorder Continue trazodone and fluoxetine  Parkinson disease Continue Sinemet  Constipation Likely related to opiate use.  Continue bowel regimen on discharge.  Obesity Estimated body mass index is 36.29 kg/m as calculated from the following:   Height as of this encounter: 5\' 2"  (1.575 m).   Weight as of this encounter: 90 kg.    Consultants: None Procedures performed: None Disposition: Skilled nursing facility Diet recommendation: Regular diet   DISCHARGE MEDICATION: Allergies as of 07/28/2023       Reactions   Carbamazepine Other (See Comments)   Parkinsons like symptoms tremors   Sertraline Hcl Other (See Comments)   Unknown reaction        Medication List     STOP taking these medications    ibuprofen 200 MG tablet Commonly known as: ADVIL   oxybutynin 5 MG tablet Commonly known as: DITROPAN       TAKE these medications    ALPRAZolam 0.5 MG tablet Commonly known as: XANAX Take 0.5 tablets (0.25 mg total) by mouth 3 (three) times daily as needed for anxiety.   betamethasone valerate ointment 0.1 % Commonly known as: VALISONE Apply 1 Application topically 2 (two) times daily.   carbidopa-levodopa 25-100 MG tablet Commonly known as:  SINEMET IR Take 1 tablet by mouth in the morning and at  bedtime.   cefadroxil 500 MG capsule Commonly known as: DURICEF Take 2 capsules (1,000 mg total) by mouth 2 (two) times daily for 4 days.   docusate sodium 100 MG capsule Commonly known as: COLACE Take 200 mg by mouth at bedtime.   donepezil 10 MG tablet Commonly known as: ARICEPT Take 10 mg by mouth daily.   FISH OIL PO Take by mouth.   FLUoxetine 40 MG capsule Commonly known as: PROZAC Take 1 capsule (40 mg total) by mouth daily. Start taking on: July 29, 2023 What changed: when to take this   gabapentin 100 MG capsule Commonly known as: NEURONTIN Take 1 capsule (100 mg total) by mouth at bedtime. What changed:  medication strength how much to take   HYDROcodone-acetaminophen 10-325 MG tablet Commonly known as: NORCO Take 0.5-1 tablets by mouth every 6 (six) hours as needed for moderate pain (pain score 4-6) or severe pain (pain score 7-10). What changed: how much to take   lamoTRIgine 150 MG tablet Commonly known as: LAMICTAL Take 1 tablet by mouth 2 (two) times daily.   levETIRAcetam 1000 MG tablet Commonly known as: KEPPRA Take 1 tablet (1,000 mg total) by mouth 2 (two) times daily. What changed:  medication strength how much to take   levocetirizine 5 MG tablet Commonly known as: XYZAL Take 1 tablet (5 mg total) by mouth every evening.   meclizine 25 MG tablet Commonly known as: ANTIVERT Take 1 tablet (25 mg total) by mouth 3 (three) times daily as needed for dizziness.   memantine 10 MG tablet Commonly known as: NAMENDA Take 10 mg by mouth 2 (two) times daily.   multivitamin with minerals tablet Take 1 tablet by mouth daily.   nitroGLYCERIN 0.4 MG SL tablet Commonly known as: NITROSTAT Place 1 tablet (0.4 mg total) under the tongue every 5 (five) minutes as needed for chest pain.   omeprazole 40 MG capsule Commonly known as: PRILOSEC Take 1 capsule (40 mg total) by mouth 2 (two) times daily before a meal. What changed: when to take this    polyethylene glycol 17 g packet Commonly known as: MIRALAX / GLYCOLAX Take 17 g by mouth 2 (two) times daily.   predniSONE 5 MG tablet Commonly known as: DELTASONE Take 1 tablet (5 mg total) by mouth 2 (two) times daily with a meal.   primidone 50 MG tablet Commonly known as: MYSOLINE Take 100 mg by mouth 3 (three) times daily.   PROBIOTIC DAILY PO Take 1 tablet by mouth daily.   rizatriptan 10 MG disintegrating tablet Commonly known as: MAXALT-MLT Take 10 mg by mouth See admin instructions. Take one tablet (10 mg) by mouth daily as needed for migraine. May repeat in 2 hours if still needed   rosuvastatin 10 MG tablet Commonly known as: CRESTOR Take 1 tablet (10 mg total) by mouth daily.   senna-docusate 8.6-50 MG tablet Commonly known as: Senokot-S Take 1 tablet by mouth 2 (two) times daily.   traZODone 100 MG tablet Commonly known as: DESYREL Take 2 tablets (200 mg total) by mouth at bedtime.        Follow-up Information     Myrlene Broker, MD. Schedule an appointment as soon as possible for a visit in 1 week(s).   Specialty: Internal Medicine Why: For hospital follow-up Contact information: [address] [place] [address] [phone]  Discharge Exam: BP 113/62 (BP Location: Right Arm)   Pulse 64   Temp 98 F (36.7 C) (Oral)   Resp 17   Ht 5\' 2"  (1.575 m)   Wt 90 kg   SpO2 94%   BMI 36.29 kg/m   General exam: Appears calm and comfortable Respiratory system: Clear to auscultation. Respiratory effort normal. Cardiovascular system: S1 & S2 heard, RRR. No murmurs, rubs, gallops or clicks. Gastrointestinal system: Abdomen is distended, soft and nontender. Normal bowel sounds heard. Central nervous system: Alert and oriented. No focal neurological deficits. Musculoskeletal: No edema. No calf tenderness Psychiatry: Judgement and insight appear normal. Mood & affect appropriate.   Condition at discharge: stable  The  results of significant diagnostics from this hospitalization (including imaging, microbiology, ancillary and laboratory) are listed below for reference.   Imaging Studies: MR BRAIN WO CONTRAST  Result Date: 07/27/2023 CLINICAL DATA:  Neuro deficit, acute, stroke suspected EXAM: MRI HEAD WITHOUT CONTRAST TECHNIQUE: Multiplanar, multiecho pulse sequences of the brain and surrounding structures were obtained without intravenous contrast. COMPARISON:  Head CT 07/23/2023 FINDINGS: Brain: Negative for an acute infarct. No hemorrhage. No hydrocephalus. No extra-axial fluid collection. No mass effect. No mass lesion. There is a background of mild chronic microvascular ischemic change. Enlarged and partially empty sella. Vascular: Normal flow voids. Skull and upper cervical spine: Normal marrow signal. Sinuses/Orbits: No middle ear or mastoid effusion. Paranasal sinuses are clear. Bilateral lens replacement. Orbits are otherwise unremarkable. Other: None. IMPRESSION: No acute intracranial process. Electronically Signed   By: Lorenza Cambridge M.D.   On: 07/27/2023 10:48   EEG adult  Result Date: 07/25/2023 Charlsie Quest, MD     07/25/2023  5:24 PM Patient Name: Valerie [name] MRN: [id number] Epilepsy Attending: Charlsie Quest Referring Physician/Provider: Zigmund Daniel., MD Date: 07/25/2023 Duration: 22.28 mins Patient history: 62yo F with ams getting eeg to evaluate for seizure Level of alertness: Awake, asleep AEDs during EEG study: Xanax, LEV, LTG, GBP, primidone Technical aspects: This EEG study was done with scalp electrodes positioned according to the 10-20 International system of electrode placement. Electrical activity was reviewed with band pass filter of 1-70Hz , sensitivity of 7 uV/mm, display speed of 71mm/sec with a 60Hz  notched filter applied as appropriate. EEG data were recorded continuously and digitally stored.  Video monitoring was available and reviewed as appropriate. Description: The  posterior dominant rhythm consists of 8-9 Hz activity of moderate voltage (25-35 uV) seen predominantly in posterior head regions, symmetric and reactive to eye opening and eye closing. Sleep was characterized by vertex waves, sleep spindles (12 to 14 Hz), maximal frontocentral region. Physiologic photic driving was seen during photic stimulation. Hyperventilation was not performed.   IMPRESSION: This study is within normal limits. No seizures or epileptiform discharges were seen throughout the recording. A normal interictal EEG does not exclude the diagnosis of epilepsy. Charlsie Quest   CT Knee Right Wo Contrast  Result Date: 07/23/2023 CLINICAL DATA:  Knee trauma, occult fracture suspected. Recent fall. EXAM: CT OF THE RIGHT KNEE WITHOUT CONTRAST TECHNIQUE: Multidetector CT imaging of the right knee was performed according to the standard protocol. Multiplanar CT image reconstructions were also generated. RADIATION DOSE REDUCTION: This exam was performed according to the departmental dose-optimization program which includes automated exposure control, adjustment of the mA and/or kV according to patient size and/or use of iterative reconstruction technique. COMPARISON:  Radiographs same date and 05/12/2023. FINDINGS: Bones/Joint/Cartilage The bones are demineralized. Age advanced tricompartmental degenerative changes are again  noted, greatest within the medial compartment. There is no evidence of acute fracture, dislocation or osteonecrosis. There is a small nonspecific knee joint effusion without lipohemarthrosis. Possible small loose bodies or fragmented osteophytes anteromedially. Ligaments Suboptimally assessed by CT. Muscles and Tendons Intact extensor mechanism. Mild semimembranosus muscle fatty atrophy. Soft tissues Mild subcutaneous edema anterolaterally. No focal fluid collection, foreign body or soft tissue emphysema. IMPRESSION: 1. No evidence of acute fracture or dislocation. 2. Age advanced  tricompartmental degenerative changes, greatest within the medial compartment. 3. Small nonspecific knee joint effusion with possible small loose bodies or fragmented osteophytes anteromedially. Electronically Signed   By: Carey Bullocks M.D.   On: 07/23/2023 16:56   CT CHEST ABDOMEN PELVIS W CONTRAST  Result Date: 07/23/2023 CLINICAL DATA:  One day history of recurrent falls with diffuse pain. EXAM: CT CHEST, ABDOMEN, AND PELVIS WITH CONTRAST TECHNIQUE: Multidetector CT imaging of the chest, abdomen and pelvis was performed following the standard protocol during bolus administration of intravenous contrast. RADIATION DOSE REDUCTION: This exam was performed according to the departmental dose-optimization program which includes automated exposure control, adjustment of the mA and/or kV according to patient size and/or use of iterative reconstruction technique. CONTRAST:  60mL OMNIPAQUE IOHEXOL 350 MG/ML SOLN COMPARISON:  Same day chest radiograph, CT abdomen and pelvis dated 09/10/2020 FINDINGS: CT CHEST FINDINGS Cardiovascular: Normal heart size. No significant pericardial fluid/thickening. Great vessels are normal in course and caliber. No central pulmonary emboli. Mediastinum/Nodes: Imaged thyroid gland without nodules meeting criteria for imaging follow-up by size. Normal esophagus. No pathologically enlarged axillary, supraclavicular, mediastinal, or hilar lymph nodes. Lungs/Pleura: The central airways are patent. Subsegmental atelectasis/scarring in all lobes. No pneumothorax. No pleural effusion. Musculoskeletal: Old posterolateral right fourth and anterior right fourth and left fourth through seventh rib fractures. Multilevel degenerative changes of the thoracic spine. Age indeterminate superior endplate compression of T9 and T11. CT ABDOMEN PELVIS FINDINGS Hepatobiliary: No focal hepatic lesions. No intra or extrahepatic biliary ductal dilation. Cholecystectomy. Pancreas: No focal lesions or main  ductal dilation. Spleen: Normal in size without focal abnormality. Adrenals/Urinary Tract: No adrenal nodules. Subtle hypoenhancement of the upper and lower polar right kidney (6:15, 25). No suspicious renal mass, calculi, or hydronephrosis. Suboptimal evaluation of the bladder due to beam hardening artifact from hip arthroplasties. Stomach/Bowel: Normal appearance of the stomach. Postsurgical changes of the sigmoid colon. Anastomosis is patent. Slightly tethered appearance of the left hemi abdominal bowel, likely postsurgical. No evidence of bowel wall thickening, distention, or inflammatory changes. Ovoid focus of intraluminal fat attenuation within left lower quadrant small bowel (3:74) may represent an intraluminal lipoma. Colonic diverticulosis without acute diverticulitis. Appendectomy. Vascular/Lymphatic: Aortic atherosclerosis. No enlarged abdominal or pelvic lymph nodes. Reproductive: No adnexal masses. Other: No free fluid, fluid collection, or free air. Musculoskeletal: No acute or abnormal lytic or blastic osseous findings. Multilevel degenerative changes of the lumbar spine. Old right inferior pubic ramus fracture. Bilateral hip arthroplasties. Postsurgical changes of the anterior abdominal wall. Subcutaneous soft tissue stranding along the posterior right upper quadrant and posterior left lower quadrant. Anterior left lower quadrant soft tissue stranding is likely postprocedural. IMPRESSION: 1. Subtle hypoenhancement of the upper and lower polar right kidney, which may be artifactual or reflect pyelonephritis. Recommend correlation with urinalysis. 2. Subcutaneous soft tissue stranding along the posterior right upper quadrant and posterior left lower quadrant, likely posttraumatic. 3. No acute traumatic injury in the chest. 4.  Aortic Atherosclerosis (ICD10-I70.0). Electronically Signed   By: Agustin Cree M.D.   On: 07/23/2023 15:15  CT HEAD WO CONTRAST  Result Date: 07/23/2023 CLINICAL DATA:  Fall.   Head trauma. EXAM: CT HEAD WITHOUT CONTRAST CT CERVICAL SPINE WITHOUT CONTRAST TECHNIQUE: Multidetector CT imaging of the head and cervical spine was performed following the standard protocol without intravenous contrast. Multiplanar CT image reconstructions of the cervical spine were also generated. RADIATION DOSE REDUCTION: This exam was performed according to the departmental dose-optimization program which includes automated exposure control, adjustment of the mA and/or kV according to patient size and/or use of iterative reconstruction technique. COMPARISON:  08/23/2022 and 11/27/2021. FINDINGS: CT HEAD FINDINGS Brain: No evidence of acute infarction, hemorrhage, hydrocephalus, extra-axial collection or mass lesion/mass effect. Vascular: No hyperdense vessel or unexpected calcification. Skull: Normal. Negative for fracture or focal lesion. Sinuses/Orbits: Globes and orbits are unremarkable. Sinuses are clear. Other: None. CT CERVICAL SPINE FINDINGS Alignment: Mild kyphosis, apex at C4-C5.  No spondylolisthesis. Skull base and vertebrae: No acute fracture. No primary bone lesion or focal pathologic process. Soft tissues and spinal canal: No prevertebral fluid or swelling. No visible canal hematoma. Disc levels: Mild loss of disc height at C4-C5. Moderate loss of disc height at C5-C6 and C6-C7. Mild disc bulging and endplate spurring noted at these levels. No disc herniation. No significant stenosis. Upper chest: Mild ground-glass opacity in the medial left upper lobe. Stable linear scarring in the left upper lobe near the apex. Other: None. IMPRESSION: CT HEAD: No acute intracranial abnormalities. CT CERVICAL SPINE: 1. No fracture or acute finding. 2. Mild ground-glass opacity in the medial left upper lobe, nonspecific but could reflect a small area of pulmonary contusion. Electronically Signed   By: Amie Portland M.D.   On: 07/23/2023 15:05   CT CERVICAL SPINE WO CONTRAST  Result Date: 07/23/2023 CLINICAL  DATA:  Fall.  Head trauma. EXAM: CT HEAD WITHOUT CONTRAST CT CERVICAL SPINE WITHOUT CONTRAST TECHNIQUE: Multidetector CT imaging of the head and cervical spine was performed following the standard protocol without intravenous contrast. Multiplanar CT image reconstructions of the cervical spine were also generated. RADIATION DOSE REDUCTION: This exam was performed according to the departmental dose-optimization program which includes automated exposure control, adjustment of the mA and/or kV according to patient size and/or use of iterative reconstruction technique. COMPARISON:  08/23/2022 and 11/27/2021. FINDINGS: CT HEAD FINDINGS Brain: No evidence of acute infarction, hemorrhage, hydrocephalus, extra-axial collection or mass lesion/mass effect. Vascular: No hyperdense vessel or unexpected calcification. Skull: Normal. Negative for fracture or focal lesion. Sinuses/Orbits: Globes and orbits are unremarkable. Sinuses are clear. Other: None. CT CERVICAL SPINE FINDINGS Alignment: Mild kyphosis, apex at C4-C5.  No spondylolisthesis. Skull base and vertebrae: No acute fracture. No primary bone lesion or focal pathologic process. Soft tissues and spinal canal: No prevertebral fluid or swelling. No visible canal hematoma. Disc levels: Mild loss of disc height at C4-C5. Moderate loss of disc height at C5-C6 and C6-C7. Mild disc bulging and endplate spurring noted at these levels. No disc herniation. No significant stenosis. Upper chest: Mild ground-glass opacity in the medial left upper lobe. Stable linear scarring in the left upper lobe near the apex. Other: None. IMPRESSION: CT HEAD: No acute intracranial abnormalities. CT CERVICAL SPINE: 1. No fracture or acute finding. 2. Mild ground-glass opacity in the medial left upper lobe, nonspecific but could reflect a small area of pulmonary contusion. Electronically Signed   By: Amie Portland M.D.   On: 07/23/2023 15:05   DG Pelvis Portable  Result Date:  07/23/2023 CLINICAL DATA:  Fall yesterday.  Pain.  Weakness.  EXAM: PORTABLE PELVIS 1-2 VIEWS COMPARISON:  07/23/2020. FINDINGS: No fracture.  No bone lesion. Bilateral total hip arthroplasties appear well seated and well aligned. SI joints and symphysis pubis are normally spaced and aligned. Stable surgical vascular clips overlie the right ilium. IMPRESSION: 1. No fracture or acute finding. 2. Well aligned bilateral total hip arthroplasties. Electronically Signed   By: Amie Portland M.D.   On: 07/23/2023 13:39   DG Knee Right Port  Result Date: 07/23/2023 CLINICAL DATA:  Blunt trauma. Several falls yesterday. Fell down approximately 17 steps this morning. EXAM: PORTABLE RIGHT KNEE - 1-2 VIEW COMPARISON:  Right knee radiographs 05/12/2023 FINDINGS: Severe medial acromial joint space narrowing again with bone-on-bone contact, subchondral sclerosis, and moderate peripheral osteophytosis. Moderate to severe patellofemoral joint space narrowing and peripheral osteophytosis, unchanged. Mild peripheral lateral compartment degenerative osteophytosis. No joint effusion. There are multiple linear lucencies overlying the posterior knee soft tissues and the proximal fibula on lateral view only from overlying clothing/sheet material. No acute fracture or dislocation. IMPRESSION: 1. No acute fracture. 2. Severe medial and patellofemoral compartment osteoarthritis, unchanged. Electronically Signed   By: Neita Garnet M.D.   On: 07/23/2023 13:38   DG Chest Port 1 View  Result Date: 07/23/2023 CLINICAL DATA:  Pain after trauma EXAM: PORTABLE CHEST 1 VIEW COMPARISON:  Chest x-ray 08/23/2022 FINDINGS: Enlarged cardiopericardial silhouette. Tortuous ectatic aorta. No consolidation, pneumothorax or effusion. No edema. Dense right upper lung nodule stable consistent with old granulomatous disease and likely calcified. Overlapping cardiac leads. Degenerative changes of the spine. If there is further concern of the sequela of  trauma additional contrast CT could be considered as clinically appropriate for further sensitivity of injury. IMPRESSION: Enlarged cardiopericardial silhouette with a tortuous aorta. Chronic lung changes. Electronically Signed   By: Karen Kays M.D.   On: 07/23/2023 13:33   MR LUMBAR SPINE WO CONTRAST  Result Date: 07/23/2023 CLINICAL DATA:  Lower back pain extending down the legs for 3 weeks EXAM: MRI LUMBAR SPINE WITHOUT CONTRAST TECHNIQUE: Multiplanar, multisequence MR imaging of the lumbar spine was performed. No intravenous contrast was administered. COMPARISON:  None Available. FINDINGS: Segmentation:  Standard. Alignment: Grade 1 anterolisthesis at L4-5, degenerative and facet mediated. Vertebrae: No fracture, evidence of discitis, or aggressive bone lesion. Hemangioma at the L1 right transverse process. Remote superior endplate fracture at T11. Conus medullaris and cauda equina: Conus extends to the L1-2 level. Conus and cauda equina appear normal. Paraspinal and other soft tissues: No perispinal mass or inflammation. Postoperative scarring and fatty atrophy at lower intrinsic back muscles. Disc levels: T11-12: Partially covered collapse with ridging. T12- L1: Unremarkable. L1-L2: Disc height loss and mild facet spurring. L2-L3: Mild disc narrowing and circumferential bulging. No neural compression L3-L4: Disc narrowing and bulging with small left paracentral protrusion. Mild ligamentum flavum thickening. The foramina are patent L4-L5: Degenerative facet spurring which is bulky with anterolisthesis. Disc is circumferentially bulging. Moderate spinal stenosis cauda equina compression. Moderate biforaminal stenosis L5-S1:Degenerative facet spurring asymmetric to the right which is bulky. Rightward disc bulging. There has been laminectomy with patent thecal sac. Patent foramina. IMPRESSION: 1. Lumbar spine degeneration especially affecting the facets of L4-5 and L5-S1 with L4-5 anterolisthesis and  moderate spinal stenosis. 2. L4-5 moderate bilateral foraminal narrowing. Electronically Signed   By: Tiburcio Pea M.D.   On: 07/23/2023 04:28    Microbiology: Results for orders placed or performed during the hospital encounter of 07/23/23  Blood culture (routine x 2)     Status: Abnormal  Collection Time: 07/23/23  6:12 PM   Specimen: BLOOD  Result Value Ref Range Status   Specimen Description BLOOD BLOOD RIGHT ARM  Final   Special Requests   Final    BOTTLES DRAWN AEROBIC AND ANAEROBIC Blood Culture adequate volume   Culture  Setup Time   Final    GRAM NEGATIVE RODS IN BOTH AEROBIC AND ANAEROBIC BOTTLES CRITICAL VALUE NOTED.  VALUE IS CONSISTENT WITH PREVIOUSLY REPORTED AND CALLED VALUE.    Culture (A)  Final    ESCHERICHIA COLI SUSCEPTIBILITIES PERFORMED ON PREVIOUS CULTURE WITHIN THE LAST 5 DAYS. Performed at [facility], [address], [place], [place] [address]    Report Status 07/26/2023 FINAL  Final  Blood culture (routine x 2)     Status: Abnormal   Collection Time: 07/23/23  6:22 PM   Specimen: BLOOD  Result Value Ref Range Status   Specimen Description BLOOD BLOOD LEFT ARM  Final   Special Requests   Final    BOTTLES DRAWN AEROBIC AND ANAEROBIC Blood Culture adequate volume   Culture  Setup Time   Final    GRAM NEGATIVE RODS IN BOTH AEROBIC AND ANAEROBIC BOTTLES CRITICAL RESULT CALLED TO, READ BACK BY AND VERIFIED WITH: PHARMD G. ABBOTT 07/24/2023 @ 0658 BY AB Performed at [facility], [address], [place], [place] [address]    Culture ESCHERICHIA COLI (A)  Final   Report Status 07/26/2023 FINAL  Final   Organism ID, Bacteria ESCHERICHIA COLI  Final   Organism ID, Bacteria ESCHERICHIA COLI  Final      Susceptibility   Escherichia coli - KIRBY BAUER*    CEFAZOLIN SENSITIVE Sensitive    Escherichia coli - MIC*    AMPICILLIN <=2 SENSITIVE Sensitive     CEFEPIME <=0.12 SENSITIVE Sensitive     CEFTAZIDIME <=1 SENSITIVE Sensitive      CEFTRIAXONE <=0.25 SENSITIVE Sensitive     CIPROFLOXACIN <=0.25 SENSITIVE Sensitive     GENTAMICIN <=1 SENSITIVE Sensitive     IMIPENEM <=0.25 SENSITIVE Sensitive     TRIMETH/SULFA <=20 SENSITIVE Sensitive     AMPICILLIN/SULBACTAM <=2 SENSITIVE Sensitive     PIP/TAZO <=4 SENSITIVE Sensitive ug/mL    * ESCHERICHIA COLI    ESCHERICHIA COLI  Blood Culture ID Panel (Reflexed)     Status: Abnormal   Collection Time: 07/23/23  6:22 PM  Result Value Ref Range Status   Enterococcus faecalis NOT DETECTED NOT DETECTED Final   Enterococcus Faecium NOT DETECTED NOT DETECTED Final   Listeria monocytogenes NOT DETECTED NOT DETECTED Final   Staphylococcus species NOT DETECTED NOT DETECTED Final   Staphylococcus aureus (BCID) NOT DETECTED NOT DETECTED Final   Staphylococcus epidermidis NOT DETECTED NOT DETECTED Final   Staphylococcus lugdunensis NOT DETECTED NOT DETECTED Final   Streptococcus species NOT DETECTED NOT DETECTED Final   Streptococcus agalactiae NOT DETECTED NOT DETECTED Final   Streptococcus pneumoniae NOT DETECTED NOT DETECTED Final   Streptococcus pyogenes NOT DETECTED NOT DETECTED Final   A.calcoaceticus-baumannii NOT DETECTED NOT DETECTED Final   Bacteroides fragilis NOT DETECTED NOT DETECTED Final   Enterobacterales DETECTED (A) NOT DETECTED Final    Comment: Enterobacterales represent a large order of gram negative bacteria, not a single organism. CRITICAL RESULT CALLED TO, READ BACK BY AND VERIFIED WITH: PHARMD G. ABBOTT 07/24/2023 @ 0658 BY AB    Enterobacter cloacae complex NOT DETECTED NOT DETECTED Final   Escherichia coli DETECTED (A) NOT DETECTED Final    Comment: CRITICAL RESULT CALLED  TO, READ BACK BY AND VERIFIED WITH: PHARMD G. ABBOTT 07/24/2023 @ 0658 BY AB    Klebsiella aerogenes NOT DETECTED NOT DETECTED Final   Klebsiella oxytoca NOT DETECTED NOT DETECTED Final   Klebsiella pneumoniae NOT DETECTED NOT DETECTED Final   Proteus species NOT DETECTED NOT DETECTED  Final   Salmonella species NOT DETECTED NOT DETECTED Final   Serratia marcescens NOT DETECTED NOT DETECTED Final   Haemophilus influenzae NOT DETECTED NOT DETECTED Final   Neisseria meningitidis NOT DETECTED NOT DETECTED Final   Pseudomonas aeruginosa NOT DETECTED NOT DETECTED Final   Stenotrophomonas maltophilia NOT DETECTED NOT DETECTED Final   Candida albicans NOT DETECTED NOT DETECTED Final   Candida auris NOT DETECTED NOT DETECTED Final   Candida glabrata NOT DETECTED NOT DETECTED Final   Candida krusei NOT DETECTED NOT DETECTED Final   Candida parapsilosis NOT DETECTED NOT DETECTED Final   Candida tropicalis NOT DETECTED NOT DETECTED Final   Cryptococcus neoformans/gattii NOT DETECTED NOT DETECTED Final   CTX-M ESBL NOT DETECTED NOT DETECTED Final   Carbapenem resistance IMP NOT DETECTED NOT DETECTED Final   Carbapenem resistance KPC NOT DETECTED NOT DETECTED Final   Carbapenem resistance NDM NOT DETECTED NOT DETECTED Final   Carbapenem resist OXA 48 LIKE NOT DETECTED NOT DETECTED Final   Carbapenem resistance VIM NOT DETECTED NOT DETECTED Final    Comment: Performed at [facility], [address] [place], [place] [address]  Urine Culture (for pregnant, neutropenic or urologic patients or patients with an indwelling urinary catheter)     Status: Abnormal   Collection Time: 07/24/23  1:19 AM   Specimen: Urine, Clean Catch  Result Value Ref Range Status   Specimen Description URINE, CLEAN CATCH  Final   Special Requests   Final    NONE Performed at [facility], [address] [place], [place] [address]    Culture MULTIPLE SPECIES PRESENT, SUGGEST RECOLLECTION (A)  Final   Report Status 07/25/2023 FINAL  Final    Labs: CBC: Recent Labs  Lab 07/23/23 1328 07/23/23 1351 07/24/23 0422 07/25/23 0743 07/27/23 0522 07/28/23 0556  WBC 16.4*  --  18.1* 9.5 7.0 7.1  NEUTROABS  --   --   --  7.7 4.9 4.9  HGB 11.7* 11.6* 10.1* 10.1* 10.8* 11.6*  HCT 36.1 34.0*  31.6* 31.1* 33.0* 34.2*  MCV 100.8*  --  100.3* 98.1 96.5 95.3  PLT 173  --  138* 136* 178 205   Basic Metabolic Panel: Recent Labs  Lab 07/23/23 1328 07/23/23 1351 07/24/23 0422 07/25/23 0743 07/27/23 0522 07/28/23 0556  NA 139 138 136 140 137 140  K 3.7 4.2 4.4 4.4 3.8 4.2  CL 105 104 106 110 101 102  CO2 24  --  21* 24 25 28   GLUCOSE 151* 147* 146* 104* 99 91  BUN 27* 39* 22 15 17 16   CREATININE 1.57* 1.50* 1.56* 1.72* 1.28* 1.34*  CALCIUM 8.3*  --  7.6* 7.9* 8.7* 8.9  MG  --   --  1.7  --  1.6* 1.9  PHOS  --   --  3.5  --  2.6 3.9   Liver Function Tests: Recent Labs  Lab 07/23/23 1328 07/27/23 0522 07/28/23 0556  AST 16 15 15   ALT 5 11 8   ALKPHOS 76 100 107  BILITOT 0.5 0.3 0.6  PROT 5.2* 5.4* 5.5*  ALBUMIN 3.0* 2.7* 2.8*   CBG: Recent Labs  Lab 07/26/23 2228 07/28/23 0014  GLUCAP  106* 101*    Discharge time spent: 35 minutes.  Signed: Jacquelin Hawking, MD [facility] 07/28/2023

## 2023-07-28 NOTE — TOC Transition Note (Signed)
Transition of Care [facility]) - CM/SW Discharge Note   Patient Details  Name: Valerie [name] MRN: [id number] Date of Birth: [birth year]  Transition of Care [facility]) CM/SW Contact:  Baldemar Lenis, LCSW Phone Number: 07/28/2023, 1:51 PM   Clinical Narrative:   Patient received insurance authorization to admit to SNF, [facility]. CSW sent discharge information to [employer]. CSW met with patient to confirm plan to discharge to SNF today, and updated daughter, [name], via phone per patient request. Transport arranged with [facility] for next available.  Nurse to call report to [phone] [facility] Room 309.    Final next level of care: Skilled Nursing Facility Barriers to Discharge: Barriers Resolved   Patient Goals and CMS Choice      Discharge Placement                [facility] Patient to be transferred to facility by: [facility] Name of family member notified: Molly Patient and family notified of of transfer: 07/28/23  Discharge Plan and Services Additional resources added to the After Visit Summary for                                       Social Determinants of Health (SDOH) Interventions SDOH Screenings   Food Insecurity: No Food Insecurity (07/24/2023)  Housing: Low Risk  (07/24/2023)  Transportation Needs: No Transportation Needs (07/24/2023)  Utilities: Not At Risk (07/24/2023)  Alcohol Screen: Low Risk  (12/04/2022)  Depression (PHQ2-9): Medium Risk (07/20/2023)  Financial Resource Strain: High Risk (01/11/2023)  Physical Activity: Inactive (07/20/2023)  Social Connections: Socially Isolated (12/04/2022)  Stress: Stress Concern Present (07/20/2023)  Tobacco Use: High Risk (07/23/2023)     Readmission Risk Interventions     No data to display

## 2023-07-28 NOTE — Discharge Instructions (Signed)
Valerie [name],  You were in the hospital with an infection in your urine and blood. This is being treated with antibiotics. Please continue your antibiotics as recommended. Your seizure medication was also adjusted for concerns of possible seizure activity; please follow-up with your neurologist.

## 2023-07-28 NOTE — Final Progress Note (Signed)
Discharged to facility transported by [facility].

## 2023-07-28 NOTE — Hospital Course (Signed)
Valerie [name] is a 62 y.o. female with a history of seizure disorder, bipolar disorder, TIA, Parkinson's disease, chronic urinary tension, CKD stage IIIb, hyperlipidemia.  Patient presented secondary to fall and right flank pain was found to have evidence of sepsis secondary to acute pyelonephritis and E. coli bacteremia.  Patient was treated empirically on ceftriaxone IV until culture data was available and was transition to cefadroxil.

## 2023-07-29 DIAGNOSIS — K5900 Constipation, unspecified: Secondary | ICD-10-CM | POA: Diagnosis not present

## 2023-07-29 LAB — GLUCOSE, CAPILLARY: Glucose-Capillary: 105 mg/dL — ABNORMAL HIGH (ref 70–99)

## 2023-07-30 DIAGNOSIS — R7881 Bacteremia: Secondary | ICD-10-CM | POA: Diagnosis not present

## 2023-07-30 DIAGNOSIS — E785 Hyperlipidemia, unspecified: Secondary | ICD-10-CM | POA: Diagnosis not present

## 2023-07-30 DIAGNOSIS — K219 Gastro-esophageal reflux disease without esophagitis: Secondary | ICD-10-CM | POA: Diagnosis not present

## 2023-07-30 DIAGNOSIS — R339 Retention of urine, unspecified: Secondary | ICD-10-CM | POA: Diagnosis not present

## 2023-07-30 DIAGNOSIS — N1832 Chronic kidney disease, stage 3b: Secondary | ICD-10-CM | POA: Diagnosis not present

## 2023-07-30 DIAGNOSIS — A419 Sepsis, unspecified organism: Secondary | ICD-10-CM | POA: Diagnosis not present

## 2023-07-30 DIAGNOSIS — N10 Acute tubulo-interstitial nephritis: Secondary | ICD-10-CM | POA: Diagnosis not present

## 2023-07-30 DIAGNOSIS — G4733 Obstructive sleep apnea (adult) (pediatric): Secondary | ICD-10-CM | POA: Diagnosis not present

## 2023-07-30 DIAGNOSIS — R5381 Other malaise: Secondary | ICD-10-CM | POA: Diagnosis not present

## 2023-07-30 DIAGNOSIS — K5900 Constipation, unspecified: Secondary | ICD-10-CM | POA: Diagnosis not present

## 2023-08-03 DIAGNOSIS — G4733 Obstructive sleep apnea (adult) (pediatric): Secondary | ICD-10-CM | POA: Diagnosis not present

## 2023-08-03 DIAGNOSIS — G309 Alzheimer's disease, unspecified: Secondary | ICD-10-CM | POA: Diagnosis not present

## 2023-08-03 DIAGNOSIS — R339 Retention of urine, unspecified: Secondary | ICD-10-CM | POA: Diagnosis not present

## 2023-08-03 DIAGNOSIS — I700 Atherosclerosis of aorta: Secondary | ICD-10-CM | POA: Diagnosis not present

## 2023-08-03 DIAGNOSIS — N1832 Chronic kidney disease, stage 3b: Secondary | ICD-10-CM | POA: Diagnosis not present

## 2023-08-03 DIAGNOSIS — R3911 Hesitancy of micturition: Secondary | ICD-10-CM | POA: Diagnosis not present

## 2023-08-03 DIAGNOSIS — I69320 Aphasia following cerebral infarction: Secondary | ICD-10-CM | POA: Diagnosis not present

## 2023-08-03 DIAGNOSIS — G43709 Chronic migraine without aura, not intractable, without status migrainosus: Secondary | ICD-10-CM | POA: Diagnosis not present

## 2023-08-03 DIAGNOSIS — M1711 Unilateral primary osteoarthritis, right knee: Secondary | ICD-10-CM | POA: Diagnosis not present

## 2023-08-03 DIAGNOSIS — I252 Old myocardial infarction: Secondary | ICD-10-CM | POA: Diagnosis not present

## 2023-08-03 DIAGNOSIS — M8580 Other specified disorders of bone density and structure, unspecified site: Secondary | ICD-10-CM | POA: Diagnosis not present

## 2023-08-03 DIAGNOSIS — N3000 Acute cystitis without hematuria: Secondary | ICD-10-CM | POA: Diagnosis not present

## 2023-08-03 DIAGNOSIS — K5900 Constipation, unspecified: Secondary | ICD-10-CM | POA: Diagnosis not present

## 2023-08-03 DIAGNOSIS — L400 Psoriasis vulgaris: Secondary | ICD-10-CM | POA: Diagnosis not present

## 2023-08-03 DIAGNOSIS — N10 Acute tubulo-interstitial nephritis: Secondary | ICD-10-CM | POA: Diagnosis not present

## 2023-08-03 DIAGNOSIS — E559 Vitamin D deficiency, unspecified: Secondary | ICD-10-CM | POA: Diagnosis not present

## 2023-08-03 DIAGNOSIS — I25118 Atherosclerotic heart disease of native coronary artery with other forms of angina pectoris: Secondary | ICD-10-CM | POA: Diagnosis not present

## 2023-08-04 DIAGNOSIS — N10 Acute tubulo-interstitial nephritis: Secondary | ICD-10-CM | POA: Diagnosis not present

## 2023-08-04 DIAGNOSIS — I252 Old myocardial infarction: Secondary | ICD-10-CM | POA: Diagnosis not present

## 2023-08-04 DIAGNOSIS — I69320 Aphasia following cerebral infarction: Secondary | ICD-10-CM | POA: Diagnosis not present

## 2023-08-04 DIAGNOSIS — L400 Psoriasis vulgaris: Secondary | ICD-10-CM | POA: Diagnosis not present

## 2023-08-04 DIAGNOSIS — I25118 Atherosclerotic heart disease of native coronary artery with other forms of angina pectoris: Secondary | ICD-10-CM | POA: Diagnosis not present

## 2023-08-04 DIAGNOSIS — M1711 Unilateral primary osteoarthritis, right knee: Secondary | ICD-10-CM | POA: Diagnosis not present

## 2023-08-04 DIAGNOSIS — N3000 Acute cystitis without hematuria: Secondary | ICD-10-CM | POA: Diagnosis not present

## 2023-08-04 DIAGNOSIS — M8580 Other specified disorders of bone density and structure, unspecified site: Secondary | ICD-10-CM | POA: Diagnosis not present

## 2023-08-04 DIAGNOSIS — R3911 Hesitancy of micturition: Secondary | ICD-10-CM | POA: Diagnosis not present

## 2023-08-04 DIAGNOSIS — I700 Atherosclerosis of aorta: Secondary | ICD-10-CM | POA: Diagnosis not present

## 2023-08-04 DIAGNOSIS — K5900 Constipation, unspecified: Secondary | ICD-10-CM | POA: Diagnosis not present

## 2023-08-04 DIAGNOSIS — R339 Retention of urine, unspecified: Secondary | ICD-10-CM | POA: Diagnosis not present

## 2023-08-04 DIAGNOSIS — G309 Alzheimer's disease, unspecified: Secondary | ICD-10-CM | POA: Diagnosis not present

## 2023-08-04 DIAGNOSIS — N1832 Chronic kidney disease, stage 3b: Secondary | ICD-10-CM | POA: Diagnosis not present

## 2023-08-04 DIAGNOSIS — G43709 Chronic migraine without aura, not intractable, without status migrainosus: Secondary | ICD-10-CM | POA: Diagnosis not present

## 2023-08-04 DIAGNOSIS — E559 Vitamin D deficiency, unspecified: Secondary | ICD-10-CM | POA: Diagnosis not present

## 2023-08-04 DIAGNOSIS — G4733 Obstructive sleep apnea (adult) (pediatric): Secondary | ICD-10-CM | POA: Diagnosis not present

## 2023-08-06 ENCOUNTER — Telehealth: Payer: Self-pay

## 2023-08-06 ENCOUNTER — Other Ambulatory Visit: Payer: Self-pay

## 2023-08-06 NOTE — Telephone Encounter (Signed)
Is this related to the paperwork? That was specifically requesting a different pharmacy (I think [facility]) and we do not prescribe all of these medications listed here

## 2023-08-06 NOTE — Telephone Encounter (Signed)
Refill request

## 2023-08-10 ENCOUNTER — Telehealth: Payer: Self-pay | Admitting: Internal Medicine

## 2023-08-10 DIAGNOSIS — G309 Alzheimer's disease, unspecified: Secondary | ICD-10-CM | POA: Diagnosis not present

## 2023-08-10 DIAGNOSIS — E559 Vitamin D deficiency, unspecified: Secondary | ICD-10-CM | POA: Diagnosis not present

## 2023-08-10 DIAGNOSIS — R3911 Hesitancy of micturition: Secondary | ICD-10-CM | POA: Diagnosis not present

## 2023-08-10 DIAGNOSIS — G43709 Chronic migraine without aura, not intractable, without status migrainosus: Secondary | ICD-10-CM | POA: Diagnosis not present

## 2023-08-10 DIAGNOSIS — N3000 Acute cystitis without hematuria: Secondary | ICD-10-CM | POA: Diagnosis not present

## 2023-08-10 DIAGNOSIS — N1832 Chronic kidney disease, stage 3b: Secondary | ICD-10-CM | POA: Diagnosis not present

## 2023-08-10 DIAGNOSIS — G4733 Obstructive sleep apnea (adult) (pediatric): Secondary | ICD-10-CM | POA: Diagnosis not present

## 2023-08-10 DIAGNOSIS — R339 Retention of urine, unspecified: Secondary | ICD-10-CM | POA: Diagnosis not present

## 2023-08-10 DIAGNOSIS — N10 Acute tubulo-interstitial nephritis: Secondary | ICD-10-CM | POA: Diagnosis not present

## 2023-08-10 DIAGNOSIS — L400 Psoriasis vulgaris: Secondary | ICD-10-CM | POA: Diagnosis not present

## 2023-08-10 DIAGNOSIS — I69320 Aphasia following cerebral infarction: Secondary | ICD-10-CM | POA: Diagnosis not present

## 2023-08-10 DIAGNOSIS — I700 Atherosclerosis of aorta: Secondary | ICD-10-CM | POA: Diagnosis not present

## 2023-08-10 DIAGNOSIS — M1711 Unilateral primary osteoarthritis, right knee: Secondary | ICD-10-CM | POA: Diagnosis not present

## 2023-08-10 DIAGNOSIS — I252 Old myocardial infarction: Secondary | ICD-10-CM | POA: Diagnosis not present

## 2023-08-10 DIAGNOSIS — I25118 Atherosclerotic heart disease of native coronary artery with other forms of angina pectoris: Secondary | ICD-10-CM | POA: Diagnosis not present

## 2023-08-10 DIAGNOSIS — K5900 Constipation, unspecified: Secondary | ICD-10-CM | POA: Diagnosis not present

## 2023-08-10 DIAGNOSIS — M8580 Other specified disorders of bone density and structure, unspecified site: Secondary | ICD-10-CM | POA: Diagnosis not present

## 2023-08-10 NOTE — Telephone Encounter (Signed)
Prescription Request  08/10/2023  LOV: 06/14/2023  What is the name of the medication or equipment? predniSONE (DELTASONE) 5 MG tablet  omeprazole (PRILOSEC) 40 MG capsule  levocetirizine (XYZAL) 5 MG tablet   nitroGLYCERIN (NITROSTAT) 0.4 MG SL tablet   carbidopa-levodopa (SINEMET IR) 25-100 MG tablet  Have you contacted your pharmacy to request a refill? No   Which pharmacy would you like this sent to?  [facility] [place] [address] [place] [address] Phone: [phone] Fax: [phone]   Patient notified that their request is being sent to the clinical staff for review and that they should receive a response within 2 business days.   Please advise at Mobile [phone] (mobile)

## 2023-08-11 DIAGNOSIS — Z136 Encounter for screening for cardiovascular disorders: Secondary | ICD-10-CM | POA: Diagnosis not present

## 2023-08-11 DIAGNOSIS — G629 Polyneuropathy, unspecified: Secondary | ICD-10-CM | POA: Diagnosis not present

## 2023-08-11 DIAGNOSIS — Z79899 Other long term (current) drug therapy: Secondary | ICD-10-CM | POA: Diagnosis not present

## 2023-08-11 DIAGNOSIS — I69320 Aphasia following cerebral infarction: Secondary | ICD-10-CM | POA: Diagnosis not present

## 2023-08-11 DIAGNOSIS — I252 Old myocardial infarction: Secondary | ICD-10-CM | POA: Diagnosis not present

## 2023-08-11 DIAGNOSIS — N10 Acute tubulo-interstitial nephritis: Secondary | ICD-10-CM | POA: Diagnosis not present

## 2023-08-11 DIAGNOSIS — G309 Alzheimer's disease, unspecified: Secondary | ICD-10-CM | POA: Diagnosis not present

## 2023-08-11 DIAGNOSIS — G43709 Chronic migraine without aura, not intractable, without status migrainosus: Secondary | ICD-10-CM | POA: Diagnosis not present

## 2023-08-11 DIAGNOSIS — N1832 Chronic kidney disease, stage 3b: Secondary | ICD-10-CM | POA: Diagnosis not present

## 2023-08-11 DIAGNOSIS — N3000 Acute cystitis without hematuria: Secondary | ICD-10-CM | POA: Diagnosis not present

## 2023-08-11 DIAGNOSIS — N189 Chronic kidney disease, unspecified: Secondary | ICD-10-CM | POA: Diagnosis not present

## 2023-08-11 DIAGNOSIS — F17210 Nicotine dependence, cigarettes, uncomplicated: Secondary | ICD-10-CM | POA: Diagnosis not present

## 2023-08-11 DIAGNOSIS — G4733 Obstructive sleep apnea (adult) (pediatric): Secondary | ICD-10-CM | POA: Diagnosis not present

## 2023-08-11 DIAGNOSIS — E559 Vitamin D deficiency, unspecified: Secondary | ICD-10-CM | POA: Diagnosis not present

## 2023-08-11 DIAGNOSIS — K5900 Constipation, unspecified: Secondary | ICD-10-CM | POA: Diagnosis not present

## 2023-08-11 DIAGNOSIS — M1711 Unilateral primary osteoarthritis, right knee: Secondary | ICD-10-CM | POA: Diagnosis not present

## 2023-08-11 DIAGNOSIS — M8580 Other specified disorders of bone density and structure, unspecified site: Secondary | ICD-10-CM | POA: Diagnosis not present

## 2023-08-11 DIAGNOSIS — I25118 Atherosclerotic heart disease of native coronary artery with other forms of angina pectoris: Secondary | ICD-10-CM | POA: Diagnosis not present

## 2023-08-11 DIAGNOSIS — I700 Atherosclerosis of aorta: Secondary | ICD-10-CM | POA: Diagnosis not present

## 2023-08-11 DIAGNOSIS — R3911 Hesitancy of micturition: Secondary | ICD-10-CM | POA: Diagnosis not present

## 2023-08-11 DIAGNOSIS — L400 Psoriasis vulgaris: Secondary | ICD-10-CM | POA: Diagnosis not present

## 2023-08-11 DIAGNOSIS — R339 Retention of urine, unspecified: Secondary | ICD-10-CM | POA: Diagnosis not present

## 2023-08-11 MED ORDER — OMEPRAZOLE 40 MG PO CPDR: 40.0000 mg | DELAYED_RELEASE_CAPSULE | Freq: Every day | ORAL | 3 refills | Status: AC

## 2023-08-11 MED ORDER — LEVOCETIRIZINE DIHYDROCHLORIDE 5 MG PO TABS: 5.0000 mg | ORAL_TABLET | Freq: Every evening | ORAL | 3 refills | Status: AC

## 2023-08-11 NOTE — Telephone Encounter (Signed)
Yes this is realted

## 2023-08-11 NOTE — Telephone Encounter (Signed)
Sent in omeprazole and xyzal. We do not prescribe prednisone, carbidopa/levodopa. nitroglycerin should only be refilled if needed and was just filled in September has she used these? If so needs assessment for chest pains

## 2023-08-11 NOTE — Telephone Encounter (Signed)
I have closed the other encounter. Since this was due to form that I have recently presented to you. Which medication are ok for renewal?

## 2023-08-13 DIAGNOSIS — N3000 Acute cystitis without hematuria: Secondary | ICD-10-CM | POA: Diagnosis not present

## 2023-08-13 DIAGNOSIS — I252 Old myocardial infarction: Secondary | ICD-10-CM | POA: Diagnosis not present

## 2023-08-13 DIAGNOSIS — M8580 Other specified disorders of bone density and structure, unspecified site: Secondary | ICD-10-CM | POA: Diagnosis not present

## 2023-08-13 DIAGNOSIS — I69320 Aphasia following cerebral infarction: Secondary | ICD-10-CM | POA: Diagnosis not present

## 2023-08-13 DIAGNOSIS — G309 Alzheimer's disease, unspecified: Secondary | ICD-10-CM | POA: Diagnosis not present

## 2023-08-13 DIAGNOSIS — R339 Retention of urine, unspecified: Secondary | ICD-10-CM | POA: Diagnosis not present

## 2023-08-13 DIAGNOSIS — G4733 Obstructive sleep apnea (adult) (pediatric): Secondary | ICD-10-CM | POA: Diagnosis not present

## 2023-08-13 DIAGNOSIS — E559 Vitamin D deficiency, unspecified: Secondary | ICD-10-CM | POA: Diagnosis not present

## 2023-08-13 DIAGNOSIS — N1832 Chronic kidney disease, stage 3b: Secondary | ICD-10-CM | POA: Diagnosis not present

## 2023-08-13 DIAGNOSIS — K5900 Constipation, unspecified: Secondary | ICD-10-CM | POA: Diagnosis not present

## 2023-08-13 DIAGNOSIS — G43709 Chronic migraine without aura, not intractable, without status migrainosus: Secondary | ICD-10-CM | POA: Diagnosis not present

## 2023-08-13 DIAGNOSIS — N10 Acute tubulo-interstitial nephritis: Secondary | ICD-10-CM | POA: Diagnosis not present

## 2023-08-13 DIAGNOSIS — I25118 Atherosclerotic heart disease of native coronary artery with other forms of angina pectoris: Secondary | ICD-10-CM | POA: Diagnosis not present

## 2023-08-13 DIAGNOSIS — M1711 Unilateral primary osteoarthritis, right knee: Secondary | ICD-10-CM | POA: Diagnosis not present

## 2023-08-13 DIAGNOSIS — L400 Psoriasis vulgaris: Secondary | ICD-10-CM | POA: Diagnosis not present

## 2023-08-13 DIAGNOSIS — I700 Atherosclerosis of aorta: Secondary | ICD-10-CM | POA: Diagnosis not present

## 2023-08-13 DIAGNOSIS — R3911 Hesitancy of micturition: Secondary | ICD-10-CM | POA: Diagnosis not present

## 2023-08-16 ENCOUNTER — Other Ambulatory Visit: Payer: Self-pay | Admitting: Internal Medicine

## 2023-08-16 ENCOUNTER — Telehealth: Payer: Self-pay

## 2023-08-16 DIAGNOSIS — K5900 Constipation, unspecified: Secondary | ICD-10-CM | POA: Diagnosis not present

## 2023-08-16 DIAGNOSIS — I252 Old myocardial infarction: Secondary | ICD-10-CM | POA: Diagnosis not present

## 2023-08-16 DIAGNOSIS — R3911 Hesitancy of micturition: Secondary | ICD-10-CM | POA: Diagnosis not present

## 2023-08-16 DIAGNOSIS — R339 Retention of urine, unspecified: Secondary | ICD-10-CM | POA: Diagnosis not present

## 2023-08-16 DIAGNOSIS — G4733 Obstructive sleep apnea (adult) (pediatric): Secondary | ICD-10-CM | POA: Diagnosis not present

## 2023-08-16 DIAGNOSIS — N10 Acute tubulo-interstitial nephritis: Secondary | ICD-10-CM | POA: Diagnosis not present

## 2023-08-16 DIAGNOSIS — I25118 Atherosclerotic heart disease of native coronary artery with other forms of angina pectoris: Secondary | ICD-10-CM | POA: Diagnosis not present

## 2023-08-16 DIAGNOSIS — M8580 Other specified disorders of bone density and structure, unspecified site: Secondary | ICD-10-CM | POA: Diagnosis not present

## 2023-08-16 DIAGNOSIS — G309 Alzheimer's disease, unspecified: Secondary | ICD-10-CM | POA: Diagnosis not present

## 2023-08-16 DIAGNOSIS — E559 Vitamin D deficiency, unspecified: Secondary | ICD-10-CM | POA: Diagnosis not present

## 2023-08-16 DIAGNOSIS — I69320 Aphasia following cerebral infarction: Secondary | ICD-10-CM | POA: Diagnosis not present

## 2023-08-16 DIAGNOSIS — G43709 Chronic migraine without aura, not intractable, without status migrainosus: Secondary | ICD-10-CM | POA: Diagnosis not present

## 2023-08-16 DIAGNOSIS — M1711 Unilateral primary osteoarthritis, right knee: Secondary | ICD-10-CM | POA: Diagnosis not present

## 2023-08-16 DIAGNOSIS — N3000 Acute cystitis without hematuria: Secondary | ICD-10-CM | POA: Diagnosis not present

## 2023-08-16 DIAGNOSIS — N1832 Chronic kidney disease, stage 3b: Secondary | ICD-10-CM | POA: Diagnosis not present

## 2023-08-16 DIAGNOSIS — L400 Psoriasis vulgaris: Secondary | ICD-10-CM | POA: Diagnosis not present

## 2023-08-16 DIAGNOSIS — I700 Atherosclerosis of aorta: Secondary | ICD-10-CM | POA: Diagnosis not present

## 2023-08-16 NOTE — Telephone Encounter (Signed)
Copied from CRM [id number]. Topic: Clinical - Prescription Issue >> Aug 16, 2023 12:18 PM Irine Seal wrote: Reason for CRM: epic not routing Rx refills: erin with [facility] called in:    Most Recent Primary Care Visit:  Provider: Hillard Danker A  Department: [facility]  Visit Type: OFFICE VISIT  Date: 06/14/2023  Medication: predniSONE (DELTASONE) 5 MG tablet fluconazole (DIFLUCAN) 50 MG tablet  betamethasone valerate ointment (VALISONE) 0.1 %  Has the patient contacted their pharmacy? Yes (Agent: If no, request that the patient contact the pharmacy for the refill. If patient does not wish to contact the pharmacy document the reason why and proceed with request.) (Agent: If yes, when and what did the pharmacy advise?)  Is this the correct pharmacy for this prescription? Yes If no, delete pharmacy and type the correct one.  This is the patient's preferred pharmacy:  [facility] [place] [address] [place] [address] Phone: [phone] Fax: [phone]    Has the prescription been filled recently? Yes  Is the patient out of the medication? Yes  Has the patient been seen for an appointment in the last year OR does the patient have an upcoming appointment? Yes  Can we respond through MyChart? Yes  Agent: Please be advised that Rx refills may take up to 3 business days. We ask that you follow-up with your pharmacy.

## 2023-08-17 DIAGNOSIS — I700 Atherosclerosis of aorta: Secondary | ICD-10-CM | POA: Diagnosis not present

## 2023-08-17 DIAGNOSIS — G4733 Obstructive sleep apnea (adult) (pediatric): Secondary | ICD-10-CM | POA: Diagnosis not present

## 2023-08-17 DIAGNOSIS — G309 Alzheimer's disease, unspecified: Secondary | ICD-10-CM | POA: Diagnosis not present

## 2023-08-17 DIAGNOSIS — K5900 Constipation, unspecified: Secondary | ICD-10-CM | POA: Diagnosis not present

## 2023-08-17 DIAGNOSIS — R339 Retention of urine, unspecified: Secondary | ICD-10-CM | POA: Diagnosis not present

## 2023-08-17 DIAGNOSIS — G43709 Chronic migraine without aura, not intractable, without status migrainosus: Secondary | ICD-10-CM | POA: Diagnosis not present

## 2023-08-17 DIAGNOSIS — R3911 Hesitancy of micturition: Secondary | ICD-10-CM | POA: Diagnosis not present

## 2023-08-17 DIAGNOSIS — N1832 Chronic kidney disease, stage 3b: Secondary | ICD-10-CM | POA: Diagnosis not present

## 2023-08-17 DIAGNOSIS — M1711 Unilateral primary osteoarthritis, right knee: Secondary | ICD-10-CM | POA: Diagnosis not present

## 2023-08-17 DIAGNOSIS — E559 Vitamin D deficiency, unspecified: Secondary | ICD-10-CM | POA: Diagnosis not present

## 2023-08-17 DIAGNOSIS — I69320 Aphasia following cerebral infarction: Secondary | ICD-10-CM | POA: Diagnosis not present

## 2023-08-17 DIAGNOSIS — L400 Psoriasis vulgaris: Secondary | ICD-10-CM | POA: Diagnosis not present

## 2023-08-17 DIAGNOSIS — I25118 Atherosclerotic heart disease of native coronary artery with other forms of angina pectoris: Secondary | ICD-10-CM | POA: Diagnosis not present

## 2023-08-17 DIAGNOSIS — N10 Acute tubulo-interstitial nephritis: Secondary | ICD-10-CM | POA: Diagnosis not present

## 2023-08-17 DIAGNOSIS — M8580 Other specified disorders of bone density and structure, unspecified site: Secondary | ICD-10-CM | POA: Diagnosis not present

## 2023-08-17 DIAGNOSIS — N3000 Acute cystitis without hematuria: Secondary | ICD-10-CM | POA: Diagnosis not present

## 2023-08-17 DIAGNOSIS — I252 Old myocardial infarction: Secondary | ICD-10-CM | POA: Diagnosis not present

## 2023-08-20 NOTE — Telephone Encounter (Signed)
[facility] is calling in regarding the patient medication

## 2023-08-23 NOTE — Telephone Encounter (Signed)
Prescriptions has already been sent in. No further actions

## 2023-08-24 DIAGNOSIS — M1711 Unilateral primary osteoarthritis, right knee: Secondary | ICD-10-CM | POA: Diagnosis not present

## 2023-08-24 DIAGNOSIS — N1832 Chronic kidney disease, stage 3b: Secondary | ICD-10-CM | POA: Diagnosis not present

## 2023-08-24 DIAGNOSIS — G43709 Chronic migraine without aura, not intractable, without status migrainosus: Secondary | ICD-10-CM | POA: Diagnosis not present

## 2023-08-24 DIAGNOSIS — G4733 Obstructive sleep apnea (adult) (pediatric): Secondary | ICD-10-CM | POA: Diagnosis not present

## 2023-08-24 DIAGNOSIS — N10 Acute tubulo-interstitial nephritis: Secondary | ICD-10-CM | POA: Diagnosis not present

## 2023-08-24 DIAGNOSIS — R339 Retention of urine, unspecified: Secondary | ICD-10-CM | POA: Diagnosis not present

## 2023-08-24 DIAGNOSIS — G309 Alzheimer's disease, unspecified: Secondary | ICD-10-CM | POA: Diagnosis not present

## 2023-08-24 DIAGNOSIS — R3911 Hesitancy of micturition: Secondary | ICD-10-CM | POA: Diagnosis not present

## 2023-08-24 DIAGNOSIS — I25118 Atherosclerotic heart disease of native coronary artery with other forms of angina pectoris: Secondary | ICD-10-CM | POA: Diagnosis not present

## 2023-08-24 DIAGNOSIS — I700 Atherosclerosis of aorta: Secondary | ICD-10-CM | POA: Diagnosis not present

## 2023-08-24 DIAGNOSIS — M8580 Other specified disorders of bone density and structure, unspecified site: Secondary | ICD-10-CM | POA: Diagnosis not present

## 2023-08-24 DIAGNOSIS — E559 Vitamin D deficiency, unspecified: Secondary | ICD-10-CM | POA: Diagnosis not present

## 2023-08-24 DIAGNOSIS — I69320 Aphasia following cerebral infarction: Secondary | ICD-10-CM | POA: Diagnosis not present

## 2023-08-24 DIAGNOSIS — L400 Psoriasis vulgaris: Secondary | ICD-10-CM | POA: Diagnosis not present

## 2023-08-24 DIAGNOSIS — I252 Old myocardial infarction: Secondary | ICD-10-CM | POA: Diagnosis not present

## 2023-08-24 DIAGNOSIS — N3000 Acute cystitis without hematuria: Secondary | ICD-10-CM | POA: Diagnosis not present

## 2023-08-24 DIAGNOSIS — K5900 Constipation, unspecified: Secondary | ICD-10-CM | POA: Diagnosis not present

## 2023-09-07 ENCOUNTER — Encounter: Payer: Medicare Other | Admitting: Licensed Clinical Social Worker

## 2023-09-07 ENCOUNTER — Ambulatory Visit: Payer: Self-pay | Admitting: Licensed Clinical Social Worker

## 2023-09-07 NOTE — Patient Instructions (Signed)
 Visit Information  Thank you for taking time to visit with me today. Please don't hesitate to contact me if I can be of assistance to you.   Following are the goals we discussed today:   Goals Addressed             This Visit's Progress    Patient Stated she has stress related to finances, related to buying medications, and related to procuring food       Interventions:  Spoke with  client via phone today about client needs and status. Client said she had previously been in the hospital for care. At discharge from the hospital, client said she went to facility for rehabilitation care for 4 days.  Client then discharged back to her home with supports in place Client said she is currently receiving in home care for :  HHPT; HHOT; HH Nursing; and HH Aide.  She is appreciative of these home health services received Client said she had gone to neurologist today for an infusion. Client also said she has appointment this afternoon with neurologist related to back issues of client LCSW reminded client that client could call LCSW for SW support at [phone].  Client was appreciative of phone conversation with LCSW today          Our next appointment is by telephone on 11/09/23 at 2:00 PM   Please call the care guide team at [phone] if you need to cancel or reschedule your appointment.   If you are experiencing a Mental Health or Behavioral Health Crisis or need someone to talk to, please go to [facility] [address] [place] [phone])   The patient verbalized understanding of instructions, educational materials, and care plan provided today and DECLINED offer to receive copy of patient instructions, educational materials, and care plan.   The patient has been provided with contact information for the care management team and has been advised to call with any health related questions or concerns.   Ozell RAMAN.Hitomi Slape MSW,  LCSW Licensed Visual Merchandiser [facility] Care Management [phone]

## 2023-09-07 NOTE — Patient Outreach (Signed)
  Care Coordination   09/07/2023 Name: Valerie [name] MRN: [id number] DOB: [birth year]   Care Coordination Outreach Attempts:  An unsuccessful telephone outreach was attempted today to offer the patient information about available complex care management services.  Follow Up Plan:  Additional outreach attempts will be made to offer the patient complex care management information and services.   Encounter Outcome:  No Answer   Care Coordination Interventions:  No, not indicated    Ozell RAMAN.Nayelis Bonito MSW, LCSW Licensed Visual Merchandiser [facility] Care Management [phone]

## 2023-09-07 NOTE — Patient Outreach (Signed)
  Care Coordination   Follow Up Visit Note   09/07/2023 Name: Valerie [name] MRN: [id number] DOB: 1961-06-10  Valerie [name] is a 63 y.o. year old female who sees Valerie [name] LABOR, MD for primary care. I spoke with  Valerie [name] by phone today.  What matters to the patients health and wellness today?  Patient Stated she has stress related to finances, related to buying medications, and related to procuring food     Goals Addressed             This Visit's Progress    Patient Stated she has stress related to finances, related to buying medications, and related to procuring food       Interventions:  Spoke with  client via phone today about client needs and status. Client said she had previously been in the hospital for care. At discharge from the hospital, client said she went to facility for rehabilitation care for 4 days.  Client then discharged back to her home with supports in place Client said she is currently receiving in home care for :  HHPT; HHOT; HH Nursing; and HH Aide.  She is appreciative of these home health services received Client said she had gone to neurologist today for an infusion. Client also said she has appointment this afternoon with neurologist related to back issues of client LCSW reminded client that client could call LCSW for SW support at [phone].  Client was appreciative of phone conversation with LCSW today          SDOH assessments and interventions completed:  Yes  SDOH Interventions Today    Flowsheet Row Most Recent Value  SDOH Interventions   Depression Interventions/Treatment  Medication, Currently on Treatment  Physical Activity Interventions Other (Comments)  [challenges with mobility]  Stress Interventions Other (Comment)  [has stress in managing medical needs]        Care Coordination Interventions:  Yes, provided   Interventions Today    Flowsheet Row Most Recent Value  Chronic Disease   Chronic disease during  today's visit Other  [spoke with client about client needs]  General Interventions   General Interventions Discussed/Reviewed General Interventions Discussed, Community Resources  Education Interventions   Education Provided Provided Education  Provided Verbal Education On [employer]  Mental Health Interventions   Mental Health Discussed/Reviewed Coping Strategies  [client is trying to use coping skills to manage medical needs faced]  Pharmacy Interventions   Pharmacy Dicussed/Reviewed Pharmacy Topics Discussed        Follow up plan: Follow up call scheduled for 11/09/23 at 2;00 PM     Encounter Outcome:  Patient Visit Completed   Valerie [name].Valerie [name] MSW, LCSW Licensed Visual Merchandiser [facility] Care Management [phone]

## 2023-09-21 ENCOUNTER — Ambulatory Visit: Payer: Self-pay | Admitting: Licensed Clinical Social Worker

## 2023-09-21 NOTE — Patient Instructions (Signed)
Visit Information  Thank you for taking time to visit with me today. Please don't hesitate to contact me if I can be of assistance to you.   Following are the goals we discussed today:   Goals Addressed             This Visit's Progress    Patient Stated she has stress related to finances, related to buying medications, and related to procuring food       Interventions:  Spoke with  client via phone today about client needs and status. Client said she has been receiving in home care with :  HHPT; HHOT; HHNursing; and HHAide. She said that her home health services were coming to an end soon..  She thinks that most of her home health services will end by next week.  She said nurse is scheduled to visit her in the home next week to discharge client from service Discussed pain issues of client Client had been using a cane to help her walk. Client said she had changed doctors. Client said she no longer sees Dr Hillard Danker.  Client said she is now going to [facility] for her medical care. Client said she sees NP Cristino Martes at [facility] for medical care LCSW and client discussed that LCSW would no longer be able to provide SW support for client since she would be seeing clinician not associated with [place]. Client understood this information.  LCSW gave Jemima the name of the program she had participated in at [facility] (Care Coordination) if she needed to share this information with NP Cristino Martes. Valissa was appreciative of program support with LCSW. LCSW thanked [place] for her participation in program support. Client was appreciative of call today from LCSW         No further follow up calls needed at this time  Please call the care guide team at [phone] if you need to cancel or reschedule your appointment.   If you are experiencing a Mental Health or Behavioral Health Crisis or need someone to talk to, please go to [facility] [address] [place] [phone])   The patient verbalized understanding of instructions, educational materials, and care plan provided today and DECLINED offer to receive copy of patient instructions, educational materials, and care plan.   The patient has been provided with contact information for the care management team and has been advised to call with any health related questions or concerns.    Lorna Few  MSW, LCSW [place] Licensed Clinical Social Worker Direct Dial:  [phone] Fax:  [phone] Website:  [email or web address]

## 2023-09-21 NOTE — Patient Outreach (Signed)
  Care Coordination   Follow Up Visit Note   09/21/2023 Name: Valerie [name] MRN: [id number] DOB: May 29, 1961  Valerie [name] is a 63 y.o. year old female who sees Myrlene Broker, MD for primary care. I spoke with  Valerie [name] by phone today.  What matters to the patients health and wellness today?  Patient Stated she has stress related to finances, related to buying medications, and related to procuring food    Goals Addressed             This Visit's Progress    Patient Stated she has stress related to finances, related to buying medications, and related to procuring food       Interventions:  Spoke with  client via phone today about client needs and status. Client said she has been receiving in home care with :  HHPT; HHOT; HHNursing; and HHAide. She said that her home health services were coming to an end soon..  She thinks that most of her home health services will end by next week.  She said nurse is scheduled to visit her in the home next week to discharge client from service Discussed pain issues of client Client had been using a cane to help her walk. Client said she had changed doctors. Client said she no longer sees Dr Hillard Danker.  Client said she is now going to [facility] for her medical care. Client said she sees NP Cristino Martes at [facility] for medical care LCSW and client discussed that LCSW would no longer be able to provide SW support for client since she would be seeing clinician not associated with [place]. Client understood this information.  LCSW gave Valerie [name] the name of the program she had participated in at [facility]. (Care Coordination) if she needed to share this information with NP Cristino Martes. Kalie was appreciative of program support with LCSW. LCSW thanked [facility] for her participation in program support. Client was appreciative of call today from LCSW          SDOH assessments and interventions completed:   Yes  SDOH Interventions Today    Flowsheet Row Most Recent Value  SDOH Interventions   Depression Interventions/Treatment  Currently on Treatment  Physical Activity Interventions Other (Comments)  [has some mobility challenges]  Stress Interventions Other (Comment)  [has stress in managing medical needs]        Care Coordination Interventions:  Yes, provided   Interventions Today    Flowsheet Row Most Recent Value  Chronic Disease   Chronic disease during today's visit Other  [spoke with client about client needs]  General Interventions   General Interventions Discussed/Reviewed General Interventions Discussed, Community Resources  Education Interventions   Education Provided Provided Education  Provided Verbal Education On [employer]  Mental Health Interventions   Mental Health Discussed/Reviewed Coping Strategies  Nutrition Interventions   Nutrition Discussed/Reviewed Nutrition Discussed  Pharmacy Interventions   Pharmacy Dicussed/Reviewed Pharmacy Topics Discussed  Safety Interventions   Safety Discussed/Reviewed Fall Risk        Follow up plan: No further follow up calls needed at this time.   Encounter Outcome:  Patient Visit Completed    Valerie [name]  MSW, LCSW [place] Licensed Clinical Social Worker Direct Dial:  [phone] Fax:  [phone] Website:  [email or web address]

## 2023-10-07 ENCOUNTER — Other Ambulatory Visit: Payer: Self-pay | Admitting: Internal Medicine

## 2023-10-08 NOTE — Patient Instructions (Addendum)
SURGICAL WAITING ROOM VISITATION  Patients having surgery or a procedure may have no more than 2 support people in the waiting area - these visitors may rotate.    Children under the age of 58 must have an adult with them who is not the patient.  Due to an increase in RSV and influenza rates and associated hospitalizations, children ages 23 and under may not visit patients in [facility] hospitals.  Visitors with respiratory illnesses are discouraged from visiting and should remain at home.  If the patient needs to stay at the hospital during part of their recovery, the visitor guidelines for inpatient rooms apply. Pre-op nurse will coordinate an appropriate time for 1 support person to accompany patient in pre-op.  This support person may not rotate.    Please refer to the [facility] website for the visitor guidelines for Inpatients (after your surgery is over and you are in a regular room).       Your procedure is scheduled on: 10/19/23   Report to [facility] Main Entrance    Report to admitting at 5:15 AM   Call this number if you have problems the morning of surgery [phone]   Do not eat food :After Midnight.   After Midnight you may have the following liquids until 4:30 AM DAY OF SURGERY  Water Non-Citrus Juices (without pulp, NO RED-Apple, White grape, White cranberry) Black Coffee (NO MILK/CREAM OR CREAMERS, sugar ok)  Clear Tea (NO MILK/CREAM OR CREAMERS, sugar ok) regular and decaf                             Plain Jell-O (NO RED)                                           Fruit ices (not with fruit pulp, NO RED)                                     Popsicles (NO RED)                                                               Sports drinks like Gatorade (NO RED)                  The day of surgery:  Drink ONE (1) Pre-Surgery G2 at 4:30 AM the morning of surgery. Drink in one sitting. Do not sip.  This drink was given to you during your hospital   pre-op appointment visit. Nothing else to drink after completing the  Pre-Surgery  G2.       Oral Hygiene is also important to reduce your risk of infection.                                    Remember - BRUSH YOUR TEETH THE MORNING OF SURGERY WITH YOUR REGULAR TOOTHPASTE   Stop all vitamins and herbal supplements 7 days before surgery.   Take these medicines the morning  of surgery with A SIP OF WATER: Xanax, Carbidopa-leodopa(sinemet), Aricept, Prozac, Gabapentin, Norco, Lamictal, Ditropan(oxybutynin), Mysoline(Primidone), Prednisone             You may not have any metal on your body including hair pins, jewelry, and body piercing             Do not wear make-up, lotions, powders, perfumes/cologne, or deodorant  Do not wear nail polish including gel and S&S, artificial/acrylic nails, or any other type of covering on natural nails including finger and toenails. If you have artificial nails, gel coating, etc. that needs to be removed by a nail salon please have this removed prior to surgery or surgery may need to be canceled/ delayed if the surgeon/ anesthesia feels like they are unable to be safely monitored.   Do not shave  48 hours prior to surgery.    Do not bring valuables to the hospital. [facility] IS NOT             RESPONSIBLE   FOR VALUABLES.   Contacts, glasses, dentures or bridgework may not be worn into surgery.  DO NOT BRING YOUR HOME MEDICATIONS TO THE HOSPITAL. PHARMACY WILL DISPENSE MEDICATIONS LISTED ON YOUR MEDICATION LIST TO YOU DURING YOUR ADMISSION IN THE HOSPITAL!    Patients discharged on the day of surgery will not be allowed to drive home.  Someone NEEDS to stay with you for the first 24 hours after anesthesia.   Special Instructions: Bring a copy of your healthcare power of attorney and living will documents the day of surgery if you haven't scanned them before.              Please read over the following fact sheets you were given: IF YOU HAVE QUESTIONS  ABOUT YOUR PRE-OP INSTRUCTIONS PLEASE CALL [phone] Valerie [name]   If you received a COVID test during your pre-op visit  it is requested that you wear a mask when out in public, stay away from anyone that may not be feeling well and notify your surgeon if you develop symptoms. If you test positive for Covid or have been in contact with anyone that has tested positive in the last 10 days please notify you surgeon.      Pre-operative 5 CHG [name] Instructions   You can play a key role in reducing the risk of infection after surgery. Your skin needs to be as free of germs as possible. You can reduce the number of germs on your skin by washing with CHG (chlorhexidine gluconate) soap before surgery. CHG is an antiseptic soap that kills germs and continues to kill germs even after washing.   DO NOT use if you have an allergy to chlorhexidine/CHG or antibacterial soaps. If your skin becomes reddened or irritated, stop using the CHG and notify one of our RNs at [phone].   Please shower with the CHG soap starting 4 days before surgery using the following schedule:     Please keep in mind the following:  DO NOT shave, including legs and underarms, starting the day of your first shower.   You may shave your face at any point before/day of surgery.  Place clean sheets on your bed the day you start using CHG soap. Use a clean washcloth (not used since being washed) for each shower. DO NOT sleep with pets once you start using the CHG.   CHG Shower Instructions:  If you choose to wash your hair and private area, wash first with your normal  shampoo/soap.  After you use shampoo/soap, rinse your hair and body thoroughly to remove shampoo/soap residue.  Turn the water OFF and apply about 3 tablespoons (45 ml) of CHG soap to a CLEAN washcloth.  Apply CHG soap ONLY FROM YOUR NECK DOWN TO YOUR TOES (washing for 3-5 minutes)  DO NOT use CHG soap on face, private areas, open wounds, or sores.  Pay special  attention to the area where your surgery is being performed.  If you are having back surgery, having someone wash your back for you may be helpful. Wait 2 minutes after CHG soap is applied, then you may rinse off the CHG soap.  Pat dry with a clean towel  Put on clean clothes/pajamas   If you choose to wear lotion, please use ONLY the CHG-compatible lotions on the back of this paper.     Additional instructions for the day of surgery: DO NOT APPLY any lotions, deodorants, cologne, or perfumes.   Put on clean/comfortable clothes.  Brush your teeth.  Ask your nurse before applying any prescription medications to the skin.      CHG Compatible Lotions   Aveeno Moisturizing lotion  Cetaphil Moisturizing Cream  Cetaphil Moisturizing Lotion  Clairol Herbal Essence Moisturizing Lotion, Dry Skin  Clairol Herbal Essence Moisturizing Lotion, Extra Dry Skin  Clairol Herbal Essence Moisturizing Lotion, Normal Skin  Curel Age Defying Therapeutic Moisturizing Lotion with Alpha Hydroxy  Curel Extreme Care Body Lotion  Curel Soothing Hands Moisturizing Hand Lotion  Curel Therapeutic Moisturizing Cream, Fragrance-Free  Curel Therapeutic Moisturizing Lotion, Fragrance-Free  Curel Therapeutic Moisturizing Lotion, Original Formula  Eucerin Daily Replenishing Lotion  Eucerin Dry Skin Therapy Plus Alpha Hydroxy Crme  Eucerin Dry Skin Therapy Plus Alpha Hydroxy Lotion  Eucerin Original Crme  Eucerin Original Lotion  Eucerin Plus Crme Eucerin Plus Lotion  Eucerin TriLipid Replenishing Lotion  Keri Anti-Bacterial Hand Lotion  Keri Deep Conditioning Original Lotion Dry Skin Formula Softly Scented  Keri Deep Conditioning Original Lotion, Fragrance Free Sensitive Skin Formula  Keri Lotion Fast Absorbing Fragrance Free Sensitive Skin Formula  Keri Lotion Fast Absorbing Softly Scented Dry Skin Formula  Keri Original Lotion  Keri Skin Renewal Lotion Keri Silky Smooth Lotion  Keri Silky Smooth  Sensitive Skin Lotion  Nivea Body Creamy Conditioning Oil  Nivea Body Extra Enriched Lotion  Nivea Body Original Lotion  Nivea Body Sheer Moisturizing Lotion Nivea Crme  Nivea Skin Firming Lotion  NutraDerm 30 Skin Lotion  NutraDerm Skin Lotion  NutraDerm Therapeutic Skin Cream  NutraDerm Therapeutic Skin Lotion  ProShield Protective Hand Cream   Incentive Spirometer (Watch this video at home: [email or web address])  An incentive spirometer is a tool that can help keep your lungs clear and active. This tool measures how well you are filling your lungs with each breath. Taking long deep breaths may help reverse or decrease the chance of developing breathing (pulmonary) problems (especially infection) following: A long period of time when you are unable to move or be active. BEFORE THE PROCEDURE  If the spirometer includes an indicator to show your best effort, your nurse or respiratory therapist will set it to a desired goal. If possible, sit up straight or lean slightly forward. Try not to slouch. Hold the incentive spirometer in an upright position. INSTRUCTIONS FOR USE  Sit on the edge of your bed if possible, or sit up as far as you can in bed or on a chair. Hold the incentive spirometer in an upright position. Breathe out normally. Place the mouthpiece  in your mouth and seal your lips tightly around it. Breathe in slowly and as deeply as possible, raising the piston or the ball toward the top of the column. Hold your breath for 3-5 seconds or for as long as possible. Allow the piston or ball to fall to the bottom of the column. Remove the mouthpiece from your mouth and breathe out normally. Rest for a few seconds and repeat Steps 1 through 7 at least 10 times every 1-2 hours when you are awake. Take your time and take a few normal breaths between deep breaths. The spirometer may include an indicator to show your best effort. Use the indicator as a goal to work  toward during each repetition. After each set of 10 deep breaths, practice coughing to be sure your lungs are clear. If you have an incision (the cut made at the time of surgery), support your incision when coughing by placing a pillow or rolled up towels firmly against it. Once you are able to get out of bed, walk around indoors and cough well. You may stop using the incentive spirometer when instructed by your caregiver.  RISKS AND COMPLICATIONS Take your time so you do not get dizzy or light-headed. If you are in pain, you may need to take or ask for pain medication before doing incentive spirometry. It is harder to take a deep breath if you are having pain. AFTER USE Rest and breathe slowly and easily. It can be helpful to keep track of a log of your progress. Your caregiver can provide you with a simple table to help with this. If you are using the spirometer at home, follow these instructions: SEEK MEDICAL CARE IF:  You are having difficultly using the spirometer. You have trouble using the spirometer as often as instructed. Your pain medication is not giving enough relief while using the spirometer. You develop fever of 100.5 F (38.1 C) or higher. SEEK IMMEDIATE MEDICAL CARE IF:  You cough up bloody sputum that had not been present before. You develop fever of 102 F (38.9 C) or greater. You develop worsening pain at or near the incision site. MAKE SURE YOU:  Understand these instructions. Will watch your condition. Will get help right away if you are not doing well or get worse. Document Released: 12/21/2006 Document Revised: 11/02/2011 Document Reviewed: 02/21/2007 [facility] Patient Information 2014 [place], [place].

## 2023-10-08 NOTE — Progress Notes (Addendum)
COVID Vaccine received:  []  No [x]  Yes Date of any COVID positive Test in last 90 days: no PCP - Cristino Martes NP [facility] Cardiologist Pt. States she doesn't have one but did see Dr. Weston Brass 02/10/21  Chest x-ray - CT chest 07/23/23 Epic EKG -  10/11/23 Epic Stress Test -  ECHO - 11/14/19 Epic Cardiac Cath - 11/14/19 Epic  Bowel Prep - [x]  No  []   Yes ______  Pacemaker / ICD device [x]  No []  Yes   Spinal Cord Stimulator:[x]  No []  Yes       History of Sleep Apnea? []  No [x]  Yes   CPAP used?- []  No [x]  Yes    Does the patient monitor blood sugar?          [x]  No []  Yes  []  N/A  Patient has: [x]  NO Hx DM   []  Pre-DM                 []  DM1  []   DM2 Does patient have a [employer] or Dexacom? []  No []  Yes   Fasting Blood Sugar Ranges-  Checks Blood Sugar _____ times a day  GLP1 agonist / usual dose - no GLP1 instructions:  SGLT-2 inhibitors / usual dose - no SGLT-2 instructions:   Blood Thinner / Instructions:no Aspirin Instructions:81mg  ASA Will stop 7 days prior to surgery.  Comments:   Activity level: Patient is able to climb a flight of stairs without difficulty; [x]  No CP  [x]  No SOB, _   Patient can perform ADLs without assistance.   Anesthesia review: OSA, Smoker, HTN, CAD, Seizures, Aortic atherosclerosis, Parkinsons, Abnl. EKG  Patient denies shortness of breath, fever, cough and chest pain at PAT appointment.  Patient verbalized understanding and agreement to the Pre-Surgical Instructions that were given to them at this PAT appointment. Patient was also educated of the need to review these PAT instructions again prior to his/her surgery.I reviewed the appropriate phone numbers to call if they have any and questions or concerns.

## 2023-10-11 ENCOUNTER — Encounter (HOSPITAL_COMMUNITY)
Admission: RE | Admit: 2023-10-11 | Discharge: 2023-10-11 | Disposition: A | Discharge status: Home / Self Care | Payer: Medicare Other | Source: Ambulatory Visit | Attending: Orthopedic Surgery

## 2023-10-11 ENCOUNTER — Other Ambulatory Visit: Payer: Self-pay

## 2023-10-11 ENCOUNTER — Encounter (HOSPITAL_COMMUNITY): Payer: Self-pay

## 2023-10-11 VITALS — BP 123/76 | HR 60 | Temp 98.3°F | Resp 16 | Ht 62.0 in | Wt 191.0 lb

## 2023-10-11 DIAGNOSIS — R9431 Abnormal electrocardiogram [ECG] [EKG]: Secondary | ICD-10-CM | POA: Insufficient documentation

## 2023-10-11 DIAGNOSIS — Z01818 Encounter for other preprocedural examination: Secondary | ICD-10-CM | POA: Diagnosis not present

## 2023-10-11 DIAGNOSIS — I10 Essential (primary) hypertension: Secondary | ICD-10-CM | POA: Diagnosis not present

## 2023-10-11 DIAGNOSIS — E1140 Type 2 diabetes mellitus with diabetic neuropathy, unspecified: Secondary | ICD-10-CM | POA: Insufficient documentation

## 2023-10-11 DIAGNOSIS — Z01810 Encounter for preprocedural cardiovascular examination: Secondary | ICD-10-CM | POA: Diagnosis present

## 2023-10-11 DIAGNOSIS — Z01812 Encounter for preprocedural laboratory examination: Secondary | ICD-10-CM | POA: Diagnosis present

## 2023-10-11 HISTORY — DX: Unspecified dementia, unspecified severity, without behavioral disturbance, psychotic disturbance, mood disturbance, and anxiety: F03.90

## 2023-10-11 HISTORY — DX: Thyrotoxicosis, unspecified without thyrotoxic crisis or storm: E05.90

## 2023-10-11 HISTORY — DX: Myoneural disorder, unspecified: G70.9

## 2023-10-11 LAB — CBC
HCT: 38.2 % (ref 36.0–46.0)
Hemoglobin: 12.4 g/dL (ref 12.0–15.0)
MCH: 32.6 pg (ref 26.0–34.0)
MCHC: 32.5 g/dL (ref 30.0–36.0)
MCV: 100.5 fL — ABNORMAL HIGH (ref 80.0–100.0)
Platelets: 230 10*3/uL (ref 150–400)
RBC: 3.8 MIL/uL — ABNORMAL LOW (ref 3.87–5.11)
RDW: 13.8 % (ref 11.5–15.5)
WBC: 10.3 10*3/uL (ref 4.0–10.5)
nRBC: 0 % (ref 0.0–0.2)

## 2023-10-11 LAB — BASIC METABOLIC PANEL
Anion gap: 9 (ref 5–15)
BUN: 35 mg/dL — ABNORMAL HIGH (ref 8–23)
CO2: 24 mmol/L (ref 22–32)
Calcium: 8.6 mg/dL — ABNORMAL LOW (ref 8.9–10.3)
Chloride: 105 mmol/L (ref 98–111)
Creatinine, Ser: 1.42 mg/dL — ABNORMAL HIGH (ref 0.44–1.00)
GFR, Estimated: 42 mL/min — ABNORMAL LOW (ref >60)
Glucose, Bld: 96 mg/dL (ref 70–99)
Potassium: 4.1 mmol/L (ref 3.5–5.1)
Sodium: 138 mmol/L (ref 135–145)

## 2023-10-11 LAB — SURGICAL PCR SCREEN
MRSA, PCR: NEGATIVE
Staphylococcus aureus: NEGATIVE

## 2023-10-12 ENCOUNTER — Telehealth: Payer: Self-pay | Admitting: Internal Medicine

## 2023-10-12 NOTE — Care Plan (Signed)
Ortho Bundle Case Management Note  Patient Details  Name: Valerie [name] MRN: [id number] Date of Birth: 1961/07/08   spoke with patient on phone. she will discharge to home with friends to help. CPM ordered. HHPT referral to [facility] care. OPPT set up with [facility]. she cannot afford to come to outpatient very many times and will do the exercises on her own. MD updated. discharge instructions discussed and questions answered. Patiient and MD in agreement with plan. Choice offered.               DME Arranged:  CPM DME Agency:  [employer]  HH Arranged:  PT HH Agency:  [facility]  Additional Comments: Please contact me with any questions of if this plan should need to change.  Shauna Hugh,  RN,BSN,MHA,CCM  [facility] Orthopaedic Specialist  [phone] 10/12/2023, 1:58 PM

## 2023-10-12 NOTE — Telephone Encounter (Signed)
   Pre-operative Risk Assessment    Patient Name: Valerie [name]  DOB: 19-Mar-1961 MRN: [id number]      Request for Surgical Clearance    Procedure:  Total Knee Arthroplasty  Date of Surgery:  Clearance 10/19/23                                 Surgeon:  Dr. Teryl Lucy Surgeon's Group or Practice Name:  [facility] Phone number:  [phone] Fax number:  [phone]   Type of Clearance Requested:   - Medical    Type of Anesthesia:  Spinal   Additional requests/questions:   [name] stated the patient was cleared from a medical and cardiac stand point with their PCP (NP Cristino Martes) and Neurology (Dr.Runheim). Patient was last seen by [place] on 05/22, but due to the ov notes stating the patient needs to follow up in a year the anesthesiologist wants [place] to clear the patient. Please advise.  [name]   10/12/2023, 4:22 PM

## 2023-10-12 NOTE — Telephone Encounter (Signed)
   Name: Valerie [name]  DOB: [birth year]  MRN: [id number]  Primary Cardiologist: Parke Poisson, MD  Chart reviewed as part of pre-operative protocol coverage. Because of Valerie [name]'s past medical history and time since last visit, she will require a follow-up in-office visit in order to better assess preoperative cardiovascular risk.  Pre-op covering staff: - Please schedule appointment and call patient to inform them. If patient already had an upcoming appointment within acceptable timeframe, please add "pre-op clearance" to the appointment notes so provider is aware. - Please contact requesting surgeon's office via preferred method (i.e, phone, fax) to inform them of need for appointment prior to surgery.   Carlos Levering, NP  10/12/2023, 4:51 PM

## 2023-10-13 NOTE — Telephone Encounter (Signed)
Will update all parties involved pt has appt 10/14/22 with Reather Littler, NP. Pt last seen by cardiology 2022.

## 2023-10-13 NOTE — Progress Notes (Signed)
Medical documents received given to [facility]

## 2023-10-13 NOTE — Progress Notes (Signed)
 Cardiology Office Note    Date:  10/15/2023  ID:  Valerie [name], DOB December 02, 1960, MRN [id number] PCP:  Cristino Martes, NP  Cardiologist:  Parke Poisson, MD  Electrophysiologist:  None   Chief Complaint: Preoperative cardiac evaluation  History of Present Illness: Marland Kitchen    Valerie [name] is a 63 y.o. female with visit-pertinent history of mild nonobstructive CAD on cardiac catheterization in 10/2019, NSTEMI in 10/2019 felt to be MINOCA, prediabetes, neuropathy, hx of CVA, seizures, hypertension, chronic kidney disease and OSA.  In 10/2019 patient presented to the ED with chest pain and was found to have elevated troponin.  She underwent coronary angiography which demonstrated nonobstructive coronary artery disease.  LVEDP 25 mmHg on cath.  NSTEMI felt to be secondary to [facility], etiology may include hypertensive heart disease or small vessel disease in setting of diabetes.   Patient was last seen in clinic by Dr. Jacques Navy on 01/10/21.  She remained stable from a cardiac standpoint at that time.  She was continued on Plavix per her neurologist for history of stroke.  She had not required Lasix at that time.  Today she presents for follow up and preoperative clearance. She reports that she has been doing well. She had no cardiac concerns or complaints today. She walks with cane to assist with balance, her only residual from prior stroke in 2021. She is able to achieve greater than 4 Mets of activity without any anginal symptoms.    Labwork independently reviewed: 10/11/2023: Sodium 138, potassium 4.1, creatinine 1.42 ROS: .   Today she denies chest pain, shortness of breath, lower extremity edema, fatigue, palpitations, melena, hematuria, hemoptysis, diaphoresis, weakness, presyncope, syncope, orthopnea, and PND.  All other systems are reviewed and otherwise negative. Studies Reviewed: Marland Kitchen    EKG:  EKG is not ordered today, EKG from 10/11/23 reviewed indicating sinus bradycardia with 1st degree AV  block, low voltarge QRS, age undetermined inferior infarct, no significant change compared to prior EKGs.   CV Studies:  Cardiac Studies & Procedures   ______________________________________________________________________________________________ CARDIAC CATHETERIZATION  CARDIAC CATHETERIZATION 11/14/2019  Narrative  Prox LAD to Mid LAD lesion is 30% stenosed.  Prox RCA lesion is 25% stenosed.  LV end diastolic pressure is moderately elevated.  1. Mild nonobstructive CAD. 2. Moderately elevated LV EDP  Plan: medical management. Assess LV function by Echo. Potential etiology for chest pain include hypertensive heart disease, stress induced CM, or aortic pathology.  Findings Coronary Findings Diagnostic  Dominance: Right  Left Main Vessel was injected. Vessel is large. Vessel is angiographically normal.  Left Anterior Descending Prox LAD to Mid LAD lesion is 30% stenosed.  Left Circumflex Vessel was injected. Vessel is large. Vessel is angiographically normal. The vessel is moderately ectatic.  Right Coronary Artery Vessel was injected. Vessel is large. Vessel is angiographically normal. The vessel is moderately ectatic. Prox RCA lesion is 25% stenosed.  Intervention  No interventions have been documented.     ECHOCARDIOGRAM  ECHOCARDIOGRAM COMPLETE 11/14/2019  Narrative ECHOCARDIOGRAM REPORT    Patient Name:   Valerie [name] Date of Exam: 11/14/2019 Medical Rec #:  [id number]      Height:       62.0 in Accession #:    [id number]     Weight:       224.7 lb Date of Birth:  [birth year]       BSA:          2.009 m Patient Age:    75 years  BP:           120/56 mmHg Patient Gender: F              HR:           50 bpm. Exam Location:  Inpatient  Procedure: 2D Echo, Cardiac Doppler and Color Doppler  Indications:    Chest Pain 786.50  History:        Patient has no prior history of Echocardiogram examinations. Signs/Symptoms:Chest Pain; Risk Factors:Current  Smoker.  Sonographer:    Renella Cunas RDCS Referring Phys: [id number] [facility]  IMPRESSIONS   1. Left ventricular ejection fraction, by estimation, is 60 to 65%. The left ventricle has normal function. The left ventricle has no regional wall motion abnormalities. Left ventricular diastolic parameters were normal. 2. Right ventricular systolic function is normal. The right ventricular size is normal. Tricuspid regurgitation signal is inadequate for assessing PA pressure. 3. The mitral valve is grossly normal. No evidence of mitral valve regurgitation. No evidence of mitral stenosis. 4. The aortic valve is tricuspid. Aortic valve regurgitation is not visualized. No aortic stenosis is present. 5. The inferior vena cava is normal in size with greater than 50% respiratory variability, suggesting right atrial pressure of 3 mmHg.  FINDINGS Left Ventricle: Left ventricular ejection fraction, by estimation, is 60 to 65%. The left ventricle has normal function. The left ventricle has no regional wall motion abnormalities. The left ventricular internal cavity size was normal in size. There is no left ventricular hypertrophy. Left ventricular diastolic parameters were normal. Normal left ventricular filling pressure.  Right Ventricle: The right ventricular size is normal. No increase in right ventricular wall thickness. Right ventricular systolic function is normal. Tricuspid regurgitation signal is inadequate for assessing PA pressure.  Left Atrium: Left atrial size was normal in size.  Right Atrium: Right atrial size was normal in size. Prominent Crista terminalis.  Pericardium: There is no evidence of pericardial effusion. Presence of pericardial fat pad.  Mitral Valve: The mitral valve is grossly normal. No evidence of mitral valve regurgitation. No evidence of mitral valve stenosis.  Tricuspid Valve: The tricuspid valve is grossly normal. Tricuspid valve regurgitation is not demonstrated.  No evidence of tricuspid stenosis.  Aortic Valve: The aortic valve is tricuspid. Aortic valve regurgitation is not visualized. No aortic stenosis is present.  Pulmonic Valve: The pulmonic valve was grossly normal. Pulmonic valve regurgitation is not visualized. No evidence of pulmonic stenosis.  Aorta: The aortic root is normal in size and structure.  Venous: The inferior vena cava is normal in size with greater than 50% respiratory variability, suggesting right atrial pressure of 3 mmHg.  IAS/Shunts: No atrial level shunt detected by color flow Doppler.   LEFT VENTRICLE PLAX 2D LVIDd:         4.60 cm      Diastology LVIDs:         3.20 cm      LV e' lateral:   7.83 cm/s LV PW:         0.80 cm      LV E/e' lateral: 11.0 LV IVS:        0.80 cm      LV e' medial:    6.64 cm/s LVOT diam:     2.00 cm      LV E/e' medial:  13.0 LV SV:         74 LV SV Index:   37 LVOT Area:     3.14 cm  LV  Volumes (MOD) LV vol d, MOD A2C: 111.0 ml LV vol d, MOD A4C: 123.0 ml LV vol s, MOD A2C: 50.6 ml LV vol s, MOD A4C: 46.3 ml LV SV MOD A2C:     60.4 ml LV SV MOD A4C:     123.0 ml LV SV MOD BP:      69.6 ml  RIGHT VENTRICLE RV S prime:     8.81 cm/s TAPSE (M-mode): 1.7 cm  LEFT ATRIUM             Index       RIGHT ATRIUM           Index LA diam:        3.60 cm 1.79 cm/m  RA Area:     13.70 cm LA Vol (A2C):   28.8 ml 14.34 ml/m RA Volume:   31.70 ml  15.78 ml/m LA Vol (A4C):   37.6 ml 18.72 ml/m LA Biplane Vol: 33.1 ml 16.48 ml/m AORTIC VALVE LVOT Vmax:   119.00 cm/s LVOT Vmean:  70.100 cm/s LVOT VTI:    0.235 m  AORTA Ao Root diam: 3.00 cm  MITRAL VALVE MV Area (PHT): 2.18 cm    SHUNTS MV Decel Time: 348 msec    Systemic VTI:  0.24 m MV E velocity: 86.30 cm/s  Systemic Diam: 2.00 cm MV A velocity: 97.50 cm/s MV E/A ratio:  0.89  Lennie Odor MD Electronically signed by Lennie Odor MD Signature Date/Time: 11/14/2019/4:11:53 PM    Final           ______________________________________________________________________________________________       Current Reported Medications:.    Current Meds  Medication Sig   ALPRAZolam (XANAX) 0.5 MG tablet Take 0.5 tablets (0.25 mg total) by mouth 3 (three) times daily as needed for anxiety. (Patient taking differently: Take 0.5 mg by mouth 3 (three) times daily as needed for anxiety.)   betamethasone valerate ointment (VALISONE) 0.1 % Apply 1 Application topically 2 (two) times daily. (Patient taking differently: Apply 1 Application topically 2 (two) times daily as needed (psoriasis).)   carbidopa-levodopa (SINEMET IR) 25-100 MG tablet Take 1 tablet by mouth in the morning and at bedtime.   docusate sodium (COLACE) 100 MG capsule Take 200 mg by mouth at bedtime.   donepezil (ARICEPT) 10 MG tablet Take 10 mg by mouth daily.   FLUoxetine (PROZAC) 40 MG capsule Take 1 capsule (40 mg total) by mouth daily. (Patient taking differently: Take 80 mg by mouth daily.)   gabapentin (NEURONTIN) 300 MG capsule Take 300-600 mg by mouth See admin instructions. Take 300 mg in the morning and 600 mg at night   HYDROcodone-acetaminophen (NORCO) 10-325 MG tablet Take 0.5-1 tablets by mouth every 6 (six) hours as needed for moderate pain (pain score 4-6) or severe pain (pain score 7-10).   ibuprofen (ADVIL) 200 MG tablet Take 800 mg by mouth 2 (two) times daily as needed for moderate pain (pain score 4-6).   lamoTRIgine (LAMICTAL) 150 MG tablet Take 1 tablet by mouth 2 (two) times daily.   Lecanemab-irmb (LEQEMBI IV) Inject into the vein.   levETIRAcetam (KEPPRA) 750 MG tablet Take 750 mg by mouth 2 (two) times daily.   levocetirizine (XYZAL) 5 MG tablet Take 1 tablet (5 mg total) by mouth every evening.   meclizine (ANTIVERT) 12.5 MG tablet Take 12.5 mg by mouth 3 (three) times daily as needed for dizziness.   memantine (NAMENDA) 10 MG tablet Take 10 mg by mouth 2 (two) times  daily.   Multiple Vitamins-Minerals  (MULTIVITAMIN WITH MINERALS) tablet Take 1 tablet by mouth at bedtime.   nitroGLYCERIN (NITROSTAT) 0.4 MG SL tablet Place 1 tablet (0.4 mg total) under the tongue every 5 (five) minutes as needed for chest pain.   Omega-3 Fatty Acids (FISH OIL PO) Take 480 mg by mouth daily.   omeprazole (PRILOSEC) 40 MG capsule Take 1 capsule (40 mg total) by mouth daily.   ondansetron (ZOFRAN) 8 MG tablet Take 8 mg by mouth every 8 (eight) hours as needed for nausea or vomiting.   oxybutynin (DITROPAN) 5 MG tablet Take 5 mg by mouth 2 (two) times daily.   polyethylene glycol (MIRALAX / GLYCOLAX) 17 g packet Take 17 g by mouth 2 (two) times daily. (Patient taking differently: Take 17 g by mouth daily as needed for moderate constipation.)   predniSONE (DELTASONE) 5 MG tablet TAKE 1 TABLET(5 MG) BY MOUTH DAILY WITH BREAKFAST   primidone (MYSOLINE) 50 MG tablet Take 100 mg by mouth 2 (two) times daily.   rizatriptan (MAXALT) 10 MG tablet Take 10 mg by mouth as needed for migraine. May repeat in 2 hours if needed   rosuvastatin (CRESTOR) 10 MG tablet Take 1 tablet (10 mg total) by mouth daily.   traZODone (DESYREL) 100 MG tablet Take 2 tablets (200 mg total) by mouth at bedtime.    Physical Exam:    VS:  BP 128/76 (BP Location: Right Arm, Patient Position: Sitting, Cuff Size: Large)   Pulse (!) 50   Ht 5\' 2"  (1.575 m)   Wt 195 lb (88.5 kg)   SpO2 97%   BMI 35.67 kg/m    Wt Readings from Last 3 Encounters:  10/15/23 195 lb (88.5 kg)  10/11/23 191 lb (86.6 kg)  07/24/23 198 lb 6.6 oz (90 kg)    GEN: Well nourished, well developed in no acute distress NECK: No JVD; No carotid bruits CARDIAC: RRR, no murmurs, rubs, gallops RESPIRATORY:  Clear to auscultation without rales, wheezing or rhonchi  ABDOMEN: Soft, non-tender, non-distended EXTREMITIES:  No edema; No acute deformity   Asessement and Plan:.    Preoperative cardiac evaluation: Total knee arthroplasty on 10/19/2023 with Dr. Teryl Lucy. Ms.  Bradshaw perioperative risk of a major cardiac event is 6.6% according to the Revised Cardiac Risk Index (RCRI).  Therefore, she is at high risk for perioperative complications.   Her functional capacity is good at 6.05 METs according to the Duke Activity Status Index (DASI). Recommendations: According to ACC/AHA guidelines, no further cardiovascular testing needed.  The patient may proceed to surgery at acceptable risk.    Mild nonobstructive CAD: Patient with history of NSTEMI in 10/2019 felt to be MINOCA.  Possibly related to hypertensive heart disease or small vessel disease in setting of diabetes. Stable with no anginal symptoms. No indication for ischemic evaluation.  Heart healthy diet and regular cardiovascular exercise encouraged.  Continue aspirin 81 mg daily per neurology, currenty on hold for surgery. Continue Crestor 10 mg daily.  Hyperlipidemia: Last lipid profile on 02/10/2023 indicated total cholesterol 156, HDL 40.3, triglycerides 162 and LDL 75.  This has been monitored and managed per PCP.  On Rosuvastatin 10 mg daily.   Hypertension: Blood pressure today 128/76. Not currently requiring antihypertensive medications.   Type 2 diabetes mellitus: Last hemoglobin A1c 5.3 on 02/10/2023. Monitored and managed by her PCP.   OSA: Patient reports that she wears her CPAP nightly.     Disposition: F/u with Dr. Jacques Navy in one year  or sooner if needed.   Signed, Rip Harbour, NP

## 2023-10-15 ENCOUNTER — Encounter: Payer: Self-pay | Admitting: Cardiology

## 2023-10-15 ENCOUNTER — Ambulatory Visit: Payer: Medicare Other | Attending: Cardiology | Admitting: Cardiology

## 2023-10-15 VITALS — BP 128/76 | HR 58 | Resp 17 | Ht 62.0 in | Wt 195.0 lb

## 2023-10-15 DIAGNOSIS — I2510 Atherosclerotic heart disease of native coronary artery without angina pectoris: Secondary | ICD-10-CM

## 2023-10-15 DIAGNOSIS — G4733 Obstructive sleep apnea (adult) (pediatric): Secondary | ICD-10-CM | POA: Diagnosis not present

## 2023-10-15 DIAGNOSIS — E1140 Type 2 diabetes mellitus with diabetic neuropathy, unspecified: Secondary | ICD-10-CM

## 2023-10-15 DIAGNOSIS — Z01810 Encounter for preprocedural cardiovascular examination: Secondary | ICD-10-CM

## 2023-10-15 DIAGNOSIS — I10 Essential (primary) hypertension: Secondary | ICD-10-CM

## 2023-10-15 NOTE — Patient Instructions (Signed)
 Medication Instructions:  No changes *If you need a refill on your cardiac medications before your next appointment, please call your pharmacy*  Lab Work: No labs If you have labs (blood work) drawn today and your tests are completely normal, you will receive your results only by: MyChart Message (if you have MyChart) OR A paper copy in the mail If you have any lab test that is abnormal or we need to change your treatment, we will call you to review the results.  Testing/Procedures: No testing  Follow-Up: At [facility], you and your health needs are our priority.  As part of our continuing mission to provide you with exceptional heart care, we have created designated Provider Care Teams.  These Care Teams include your primary Cardiologist (physician) and Advanced Practice Providers (APPs -  Physician Assistants and Nurse Practitioners) who all work together to provide you with the care you need, when you need it.  We recommend signing up for the patient portal called "MyChart".  Sign up information is provided on this After Visit Summary.  MyChart is used to connect with patients for Virtual Visits (Telemedicine).  Patients are able to view lab/test results, encounter notes, upcoming appointments, etc.  Non-urgent messages can be sent to your provider as well.   To learn more about what you can do with MyChart, go to [email or web address].    Your next appointment:   1 year(s)  Provider:   Parke Poisson, MD

## 2023-10-16 ENCOUNTER — Encounter: Payer: Self-pay | Admitting: Cardiology

## 2023-10-18 NOTE — H&P (Signed)
 PREOPERATIVE H&P  Chief Complaint: right knee pain  HPI: Valerie [name] is a 63 y.o. female who presents for preoperative history and physical with a diagnosis of right knee primary localized osteoarthritis. Symptoms are rated as moderate to severe, and have been worsening.  This is significantly impairing activities of daily living.  She has elected for surgical management.   She has failed injections, activity modification, anti-inflammatories, and assistive devices.  Preoperative X-rays demonstrate end stage degenerative changes with osteophyte formation, loss of joint space, subchondral sclerosis.   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   Stroke (HCC)    ?history; MRI normal   Past Surgical History:  Procedure Laterality Date   ABDOMINAL HYSTERECTOMY     APPENDECTOMY     CHOLECYSTECTOMY     CYSTOSCOPY WITH STENT PLACEMENT N/A 07/17/2020   Procedure: CYSTOSCOPY WITH BILATERAL FIREFLY INJECTION;  Surgeon: Heloise Purpura, MD;  Location: [facility];  Service: Urology;  Laterality: N/A;   FLEXIBLE SIGMOIDOSCOPY N/A 05/17/2020   Procedure: FLEXIBLE SIGMOIDOSCOPY;  Surgeon: Jeani Hawking, MD;  Location[facility];  Service: Endoscopy;  Laterality: N/A;   FOOT SURGERY Right    IR FLUORO GUIDE CV LINE RIGHT  08/19/2020   IR RADIOLOGIST EVAL & MGMT  09/10/2020   IR RADIOLOGIST EVAL & MGMT  10/10/2020   IR REMOVAL TUN CV CATH W/O FL  09/16/2020   IR SINUS/FIST TUBE CHK-NON GI   10/24/2020   IR SINUS/FIST TUBE CHK-NON GI  10/31/2020   IR US GUIDE VASC ACCESS RIGHT  08/19/2020   LEFT HEART CATH AND CORONARY ANGIOGRAPHY N/A 11/14/2019   Procedure: LEFT HEART CATH AND CORONARY ANGIOGRAPHY;  Surgeon: Swaziland, Peter M, MD;  Location: [facility] INVASIVE CV LAB;  Service: Cardiovascular;  Laterality: N/A;   PARATHYROID EXPLORATION N/A 01/09/2021   Procedure: POSSIBLE NECK EXPLORATION;  Surgeon: Darnell Level, MD;  Location: [facility];  Service: General;  Laterality: N/A;   PARATHYROIDECTOMY Left 01/09/2021   Procedure: LEFT INFERIOR PARATHYROIDECTOMY;  Surgeon: Darnell Level, MD;  Location: [facility];  Service: General;  Laterality: Left;   PROCTOSCOPY N/A 07/17/2020   Procedure: RIGID PROCTOSCOPY;  Surgeon: Karie Soda, MD;  Location: [facility];  Service: General;  Laterality: N/A;   TONSILLECTOMY     TOTAL HIP ARTHROPLASTY Bilateral    Social History   Socioeconomic History   Marital status: Divorced    Spouse name: Not on file   Number of children: 2   Years of education: Not on file   Highest education level: Associate degree: occupational, Scientist, product/process development, or vocational program  Occupational History   Occupation: Disabled  Tobacco Use   Smoking status: Former    Current packs/day: 0.50    Types: Cigarettes    Passive exposure: Past   Smokeless tobacco: Never  Vaping Use   Vaping status: Every Day  Substance and Sexual  Activity   Alcohol use: Yes    Comment: rarely   Drug use: Never   Sexual activity: Not on file  Other Topics Concern   Not on file  Social History Narrative   Not on file   Social Drivers of Health   Financial Resource Strain: High Risk (01/11/2023)   Overall Financial Resource Strain (CARDIA)    Difficulty of Paying Living Expenses: Very hard  Food Insecurity: No Food Insecurity (07/24/2023)   Hunger Vital Sign    Worried About Running Out of Food in the Last Year: Never true    Ran Out of Food in the Last Year: Never true  Transportation Needs: No  Transportation Needs (07/24/2023)   PRAPARE - Administrator, Civil Service (Medical): No    Lack of Transportation (Non-Medical): No  Physical Activity: Inactive (09/21/2023)   Exercise Vital Sign    Days of Exercise per Week: 0 days    Minutes of Exercise per Session: 0 min  Stress: Stress Concern Present (09/21/2023)   [employer] of Occupational Health - Occupational Stress Questionnaire    Feeling of Stress : To some extent  Social Connections: Socially Isolated (12/04/2022)   Social Connection and Isolation Panel [NHANES]    Frequency of Communication with Friends and Family: Once a week    Frequency of Social Gatherings with Friends and Family: Once a week    Attends Religious Services: Never    Database administrator or Organizations: No    Attends Engineer, structural: Not on file    Marital Status: Divorced   Family History  Problem Relation Age of Onset   Breast cancer Neg Hx    Allergies  Allergen Reactions   Carbamazepine Other (See Comments)    Parkinsons like symptoms tremors   Sertraline Hcl Other (See Comments)    Parkinsons like symptoms   Prior to Admission medications   Medication Sig Start Date End Date Taking? Authorizing Provider  ALPRAZolam Prudy Feeler) 0.5 MG tablet Take 0.5 tablets (0.25 mg total) by mouth 3 (three) times daily as needed for anxiety. Patient taking differently: Take 0.5 mg by mouth 3 (three) times daily as needed for anxiety. 07/28/23  Yes Narda Bonds, MD  betamethasone valerate ointment (VALISONE) 0.1 % Apply 1 Application topically 2 (two) times daily. Patient taking differently: Apply 1 Application topically 2 (two) times daily as needed (psoriasis). 06/14/23  Yes Myrlene Broker, MD  carbidopa-levodopa (SINEMET IR) 25-100 MG tablet Take 1 tablet by mouth in the morning and at bedtime. 08/27/22  Yes [provider]  docusate sodium (COLACE) 100 MG capsule Take 200 mg by mouth at bedtime.   Yes  [provider]  donepezil (ARICEPT) 10 MG tablet Take 10 mg by mouth daily. 10/30/19  Yes [provider]  FLUoxetine (PROZAC) 40 MG capsule Take 1 capsule (40 mg total) by mouth daily. Patient taking differently: Take 80 mg by mouth daily. 07/29/23  Yes Narda Bonds, MD  gabapentin (NEURONTIN) 300 MG capsule Take 300-600 mg by mouth See admin instructions. Take 300 mg in the morning and 600 mg at night 07/29/23  Yes [provider]  HYDROcodone-acetaminophen (NORCO) 10-325 MG tablet Take 0.5-1 tablets by mouth every 6 (six) hours as needed for moderate pain (pain score 4-6) or severe pain (pain score 7-10). 07/28/23  Yes Narda Bonds, MD  ibuprofen (ADVIL) 200 MG tablet Take 800 mg by mouth 2 (two) times daily as needed for moderate  pain (pain score 4-6).   Yes [provider]  lamoTRIgine (LAMICTAL) 150 MG tablet Take 1 tablet by mouth 2 (two) times daily. 12/28/22  Yes [provider]  levETIRAcetam (KEPPRA) 750 MG tablet Take 750 mg by mouth 2 (two) times daily.   Yes [provider]  levocetirizine (XYZAL) 5 MG tablet Take 1 tablet (5 mg total) by mouth every evening. 08/11/23  Yes Myrlene Broker, MD  meclizine (ANTIVERT) 12.5 MG tablet Take 12.5 mg by mouth 3 (three) times daily as needed for dizziness.   Yes [provider]  memantine (NAMENDA) 10 MG tablet Take 10 mg by mouth 2 (two) times daily. 03/18/22  Yes [provider]  Multiple Vitamins-Minerals (MULTIVITAMIN WITH MINERALS) tablet Take 1 tablet by mouth at bedtime.   Yes [provider]  nitroGLYCERIN (NITROSTAT) 0.4 MG SL tablet Place 1 tablet (0.4 mg total) under the tongue every 5 (five) minutes as needed for chest pain. 05/03/23  Yes Gwenlyn Fudge, FNP  Omega-3 Fatty Acids (FISH OIL PO) Take 480 mg by mouth daily.   Yes [provider]  omeprazole (PRILOSEC) 40 MG capsule Take 1 capsule (40 mg total) by mouth daily. 08/11/23  Yes  Myrlene Broker, MD  ondansetron (ZOFRAN) 8 MG tablet Take 8 mg by mouth every 8 (eight) hours as needed for nausea or vomiting.   Yes [provider]  oxybutynin (DITROPAN) 5 MG tablet Take 5 mg by mouth 2 (two) times daily. 09/13/23  Yes [provider]  polyethylene glycol (MIRALAX / GLYCOLAX) 17 g packet Take 17 g by mouth 2 (two) times daily. Patient taking differently: Take 17 g by mouth daily as needed for moderate constipation. 07/28/23  Yes Narda Bonds, MD  primidone (MYSOLINE) 50 MG tablet Take 100 mg by mouth 2 (two) times daily. 04/09/22  Yes [provider]  rizatriptan (MAXALT) 10 MG tablet Take 10 mg by mouth as needed for migraine. May repeat in 2 hours if needed   Yes [provider]  traZODone (DESYREL) 100 MG tablet Take 2 tablets (200 mg total) by mouth at bedtime. 07/23/20  Yes Sherrie George, PA-C  fluconazole (DIFLUCAN) 150 MG tablet Take 150 mg by mouth every 3 (three) days. Patient not taking: Reported on 10/15/2023 07/29/23   [provider]  Lecanemab-irmb (LEQEMBI IV) Inject into the vein.    [provider]  predniSONE (DELTASONE) 5 MG tablet TAKE 1 TABLET(5 MG) BY MOUTH DAILY WITH BREAKFAST 10/07/23   Myrlene Broker, MD  rosuvastatin (CRESTOR) 10 MG tablet Take 1 tablet (10 mg total) by mouth daily. 12/04/22   Myrlene Broker, MD     Positive ROS: All other systems have been reviewed and were otherwise negative with the exception of those mentioned in the HPI and as above.  Physical Exam: General: Alert, no acute distress Cardiovascular: No pedal edema Respiratory: No cyanosis, no use of accessory musculature GI: No organomegaly, abdomen is soft and non-tender Skin: No lesions in the area of chief complaint Neurologic: Sensation intact distally Psychiatric: Patient is competent for consent with normal mood and affect Lymphatic: No axillary or cervical lymphadenopathy  MUSCULOSKELETAL:  Right knee with range of motion 0 to 100 degrees, the varus deformity does not really correct.  Significant crepitance.  Assessment: Right knee primary localized osteoarthritis.  She also has a history of previous stroke, heart attack, sepsis.   Plan: Plan for Procedure(s): TOTAL KNEE ARTHROPLASTY  The risks benefits and  alternatives were discussed with the patient including but not limited to the risks of nonoperative treatment, versus surgical intervention including infection, bleeding, nerve injury,  blood clots, cardiopulmonary complications, morbidity, mortality, among others, and they were willing to proceed.    Patient's anticipated LOS is less than 2 midnights, meeting these requirements: - Younger than 60 - Lives within 1 hour of care - Has a competent adult at home to recover with post-op recover - NO history of  - Chronic pain requiring opiods  - Diabetes  - Coronary Artery Disease  - Heart failure  - Heart attack  - Stroke  - DVT/VTE  - Cardiac arrhythmia  - Respiratory Failure/COPD  - Renal failure  - Anemia  - Advanced Liver disease      Eulas Post, MD Cell [phone]   10/18/2023 2:09 PM

## 2023-10-18 NOTE — Progress Notes (Signed)
 Anesthesia Chart Review   Case: [id number] Date/Time: 10/19/23 0715   Procedure: TOTAL KNEE ARTHROPLASTY (Right: Knee)   Anesthesia type: Spinal   Pre-op diagnosis: RIGHT KNEE OA   [facility]   Surgeons: Teryl Lucy, MD       DISCUSSION:63 y.o. former smoker with h/o Parkinson's, sleep apnea, mild nonobstructive CAD on cath 2021, CKD III, DM II (last A1C 5.3 09/17/2023), right knee OA scheduled for above procedure with Dr. Teryl Lucy.   Pt seen by cardiology 10/15/2023. Per OV note, "Preoperative cardiac evaluation: Total knee arthroplasty on 10/19/2023 with Dr. Teryl Lucy. Ms. Cyphers perioperative risk of a major cardiac event is 6.6% according to the Revised Cardiac Risk Index (RCRI).  Therefore, she is at high risk for perioperative complications.   Her functional capacity is good at 6.05 METs according to the Duke Activity Status Index (DASI). Recommendations: According to ACC/AHA guidelines, no further cardiovascular testing needed.  The patient may proceed to surgery at acceptable risk.  "  Clearance received from PCP which states pt is optimized for surgery.   VS: BP 123/76   Pulse 60   Temp 36.8 C (Oral)   Resp 16   Ht 5\' 2"  (1.575 m)   Wt 86.6 kg   SpO2 99%   BMI 34.93 kg/m   PROVIDERS: Cristino Martes, NP is PCP    LABS: Labs reviewed: Acceptable for surgery. (all labs ordered are listed, but only abnormal results are displayed)  Labs Reviewed  BASIC METABOLIC PANEL - Abnormal; Notable for the following components:      Result Value   BUN 35 (*)    Creatinine, Ser 1.42 (*)    Calcium 8.6 (*)    GFR, Estimated 42 (*)    All other components within normal limits  CBC - Abnormal; Notable for the following components:   RBC 3.80 (*)    MCV 100.5 (*)    All other components within normal limits  SURGICAL PCR SCREEN     IMAGES:   EKG:   CV: Echo 11/14/2019 1. Left ventricular ejection fraction, by estimation, is 60 to 65%. The   left ventricle has normal function. The left ventricle has no regional  wall motion abnormalities. Left ventricular diastolic parameters were  normal.   2. Right ventricular systolic function is normal. The right ventricular  size is normal. Tricuspid regurgitation signal is inadequate for assessing  PA pressure.   3. The mitral valve is grossly normal. No evidence of mitral valve  regurgitation. No evidence of mitral stenosis.   4. The aortic valve is tricuspid. Aortic valve regurgitation is not  visualized. No aortic stenosis is present.   5. The inferior vena cava is normal in size with greater than 50%  respiratory variability, suggesting right atrial pressure of 3 mmHg.  Past Medical History:  Diagnosis Date   Anemia    Anxiety    Arthritis    Psoriatic   Bipolar disorder (HCC)    CKD (chronic kidney disease), stage III (HCC)    Colovesical fistula    Dementia (HCC)    Depression    DM type 2 (diabetes mellitus, type 2) (HCC) 11/14/2019   Dyspnea    Headache    Hyperthyroidism    Neuromuscular disorder (HCC)    Neuropathy    NSTEMI (non-ST elevated myocardial infarction) (HCC) 11/14/2019   OSA (obstructive sleep apnea)    Psoriatic arthritis (HCC)    Sleep apnea    Stricture  of sigmoid colon (HCC) 05/20/2020   Stroke [facility])    ?history; MRI normal    Past Surgical History:  Procedure Laterality Date   ABDOMINAL HYSTERECTOMY     APPENDECTOMY     CHOLECYSTECTOMY     CYSTOSCOPY WITH STENT PLACEMENT N/A 07/17/2020   Procedure: CYSTOSCOPY WITH BILATERAL FIREFLY INJECTION;  Surgeon: Heloise Purpura, MD;  Location[facility];  Service: Urology;  Laterality: N/A;   FLEXIBLE SIGMOIDOSCOPY N/A 05/17/2020   Procedure: FLEXIBLE SIGMOIDOSCOPY;  Surgeon: Jeani Hawking, MD;  [facility];  Service: Endoscopy;  Laterality: N/A;   FOOT SURGERY Right    IR FLUORO GUIDE CV LINE RIGHT  08/19/2020   IR RADIOLOGIST EVAL & MGMT  09/10/2020   IR RADIOLOGIST EVAL & MGMT  10/10/2020    IR REMOVAL TUN CV CATH W/O FL  09/16/2020   IR SINUS/FIST TUBE CHK-NON GI  10/24/2020   IR SINUS/FIST TUBE CHK-NON GI  10/31/2020   IR US GUIDE VASC ACCESS RIGHT  08/19/2020   LEFT HEART CATH AND CORONARY ANGIOGRAPHY N/A 11/14/2019   Procedure: LEFT HEART CATH AND CORONARY ANGIOGRAPHY;  Surgeon: Swazil[place], Peter M, MD;  Location: [facility] INVASIVE CV LAB;  Service: Cardiovascular;  Laterality: N/A;   PARATHYROID EXPLORATION N/A 01/09/2021   Procedure: POSSIBLE NECK EXPLORATION;  Surgeon: Darnell Level, MD;  Location: [facility];  Service: General;  Laterality: N/A;   PARATHYROIDECTOMY Left 01/09/2021   Procedure: LEFT INFERIOR PARATHYROIDECTOMY;  Surgeon: Darnell Level, MD;  Location[facility];  Service: General;  Laterality: Left;   PROCTOSCOPY N/A 07/17/2020   Procedure: RIGID PROCTOSCOPY;  Surgeon: Karie Soda, MD;  Location[facility];  Service: General;  Laterality: N/A;   TONSILLECTOMY     TOTAL HIP ARTHROPLASTY Bilateral     MEDICATIONS:  ALPRAZolam (XANAX) 0.5 MG tablet   betamethasone valerate ointment (VALISONE) 0.1 %   carbidopa-levodopa (SINEMET IR) 25-100 MG tablet   docusate sodium (COLACE) 100 MG capsule   donepezil (ARICEPT) 10 MG tablet   fluconazole (DIFLUCAN) 150 MG tablet   FLUoxetine (PROZAC) 40 MG capsule   gabapentin (NEURONTIN) 300 MG capsule   HYDROcodone-acetaminophen (NORCO) 10-325 MG tablet   ibuprofen (ADVIL) 200 MG tablet   lamoTRIgine (LAMICTAL) 150 MG tablet   Lecanemab-irmb (LEQEMBI IV)   levETIRAcetam (KEPPRA) 750 MG tablet   levocetirizine (XYZAL) 5 MG tablet   meclizine (ANTIVERT) 12.5 MG tablet   memantine (NAMENDA) 10 MG tablet   Multiple Vitamins-Minerals (MULTIVITAMIN WITH MINERALS) tablet   nitroGLYCERIN (NITROSTAT) 0.4 MG SL tablet   Omega-3 Fatty Acids (FISH OIL PO)   omeprazole (PRILOSEC) 40 MG capsule   ondansetron (ZOFRAN) 8 MG tablet   oxybutynin (DITROPAN) 5 MG tablet   polyethylene glycol (MIRALAX / GLYCOLAX) 17 g packet   predniSONE (DELTASONE) 5 MG  tablet   primidone (MYSOLINE) 50 MG tablet   rizatriptan (MAXALT) 10 MG tablet   rosuvastatin (CRESTOR) 10 MG tablet   traZODone (DESYREL) 100 MG tablet   No current facility-administered medications for this encounter.   Jodell Cipro Ward, PA-C [facility] [phone]

## 2023-10-19 ENCOUNTER — Other Ambulatory Visit: Payer: Self-pay | Admitting: Student

## 2023-10-19 ENCOUNTER — Ambulatory Visit (HOSPITAL_COMMUNITY): Payer: Medicare Other | Admitting: Anesthesiology

## 2023-10-19 ENCOUNTER — Ambulatory Visit (HOSPITAL_COMMUNITY): Payer: Medicare Other | Admitting: Physician Assistant

## 2023-10-19 ENCOUNTER — Encounter (HOSPITAL_COMMUNITY): Payer: Self-pay | Admitting: Orthopedic Surgery

## 2023-10-19 ENCOUNTER — Other Ambulatory Visit: Payer: Self-pay

## 2023-10-19 ENCOUNTER — Encounter (HOSPITAL_COMMUNITY)
Admission: RE | Disposition: A | Discharge status: Home / Self Care | Payer: Self-pay | Source: Ambulatory Visit | Attending: Orthopedic Surgery

## 2023-10-19 ENCOUNTER — Ambulatory Visit (HOSPITAL_COMMUNITY)
Admission: RE | Admit: 2023-10-19 | Discharge: 2023-10-19 | Disposition: A | Discharge status: Home / Self Care | Payer: Medicare Other | Source: Ambulatory Visit | Attending: Orthopedic Surgery | Admitting: Orthopedic Surgery

## 2023-10-19 ENCOUNTER — Ambulatory Visit (HOSPITAL_COMMUNITY): Payer: Medicare Other

## 2023-10-19 DIAGNOSIS — N183 Chronic kidney disease, stage 3 unspecified: Secondary | ICD-10-CM | POA: Diagnosis not present

## 2023-10-19 DIAGNOSIS — M1711 Unilateral primary osteoarthritis, right knee: Secondary | ICD-10-CM | POA: Diagnosis present

## 2023-10-19 DIAGNOSIS — I2510 Atherosclerotic heart disease of native coronary artery without angina pectoris: Secondary | ICD-10-CM | POA: Insufficient documentation

## 2023-10-19 DIAGNOSIS — F419 Anxiety disorder, unspecified: Secondary | ICD-10-CM | POA: Diagnosis not present

## 2023-10-19 DIAGNOSIS — Z8673 Personal history of transient ischemic attack (TIA), and cerebral infarction without residual deficits: Secondary | ICD-10-CM | POA: Diagnosis not present

## 2023-10-19 DIAGNOSIS — I129 Hypertensive chronic kidney disease with stage 1 through stage 4 chronic kidney disease, or unspecified chronic kidney disease: Secondary | ICD-10-CM | POA: Diagnosis not present

## 2023-10-19 DIAGNOSIS — Z87891 Personal history of nicotine dependence: Secondary | ICD-10-CM | POA: Insufficient documentation

## 2023-10-19 DIAGNOSIS — I252 Old myocardial infarction: Secondary | ICD-10-CM | POA: Insufficient documentation

## 2023-10-19 DIAGNOSIS — F319 Bipolar disorder, unspecified: Secondary | ICD-10-CM | POA: Diagnosis not present

## 2023-10-19 DIAGNOSIS — Z5986 Financial insecurity: Secondary | ICD-10-CM | POA: Insufficient documentation

## 2023-10-19 DIAGNOSIS — E1122 Type 2 diabetes mellitus with diabetic chronic kidney disease: Secondary | ICD-10-CM | POA: Insufficient documentation

## 2023-10-19 DIAGNOSIS — J449 Chronic obstructive pulmonary disease, unspecified: Secondary | ICD-10-CM | POA: Diagnosis not present

## 2023-10-19 HISTORY — PX: TOTAL KNEE ARTHROPLASTY: SHX125

## 2023-10-19 HISTORY — DX: Unilateral primary osteoarthritis, right knee: M17.11

## 2023-10-19 LAB — GLUCOSE, CAPILLARY: Glucose-Capillary: 99 mg/dL (ref 70–99)

## 2023-10-19 SURGERY — ARTHROPLASTY, KNEE, TOTAL
Anesthesia: Monitor Anesthesia Care | Site: Knee | Laterality: Right

## 2023-10-19 MED ORDER — MIDAZOLAM HCL 2 MG/2ML IJ SOLN
INTRAMUSCULAR | Status: DC | PRN
  Administered 2023-10-19 (×2): 1 mg via INTRAVENOUS

## 2023-10-19 MED ORDER — EPHEDRINE SULFATE-NACL 50-0.9 MG/10ML-% IV SOSY
PREFILLED_SYRINGE | INTRAVENOUS | Status: DC | PRN
Start: 2023-10-19 — End: 2023-10-19
  Administered 2023-10-19 (×3): 10 mg via INTRAVENOUS

## 2023-10-19 MED ORDER — CEFAZOLIN SODIUM-DEXTROSE 2-4 GM/100ML-% IV SOLN
2.0000 g | INTRAVENOUS | Status: AC
  Administered 2023-10-19: 2 g via INTRAVENOUS
  Filled 2023-10-19: qty 100

## 2023-10-19 MED ORDER — PROPOFOL 10 MG/ML IV BOLUS
INTRAVENOUS | Status: AC
  Filled 2023-10-19: qty 20

## 2023-10-19 MED ORDER — ONDANSETRON HCL 8 MG PO TABS: 8.0000 mg | ORAL_TABLET | Freq: Three times a day (TID) | ORAL | 0 refills | Status: DC | PRN

## 2023-10-19 MED ORDER — SENNA-DOCUSATE SODIUM 8.6-50 MG PO TABS: 2.0000 | ORAL_TABLET | Freq: Every day | ORAL | 1 refills | Status: DC

## 2023-10-19 MED ORDER — PROPOFOL 500 MG/50ML IV EMUL: INTRAVENOUS | Status: DC | PRN

## 2023-10-19 MED ORDER — ONDANSETRON HCL 4 MG/2ML IJ SOLN
INTRAMUSCULAR | Status: DC | PRN
  Administered 2023-10-19: 4 mg via INTRAVENOUS

## 2023-10-19 MED ORDER — OXYCODONE HCL 5 MG PO TABS: 10.0000 mg | ORAL_TABLET | ORAL | Status: DC | PRN

## 2023-10-19 MED ORDER — CEFAZOLIN SODIUM-DEXTROSE 2-4 GM/100ML-% IV SOLN: 2.0000 g | Freq: Four times a day (QID) | INTRAVENOUS | Status: DC

## 2023-10-19 MED ORDER — FENTANYL CITRATE (PF) 100 MCG/2ML IJ SOLN
INTRAMUSCULAR | Status: AC
  Filled 2023-10-19: qty 2

## 2023-10-19 MED ORDER — EPHEDRINE 5 MG/ML INJ
INTRAVENOUS | Status: AC
  Filled 2023-10-19: qty 5

## 2023-10-19 MED ORDER — DEXAMETHASONE SODIUM PHOSPHATE 10 MG/ML IJ SOLN
INTRAMUSCULAR | Status: AC
  Filled 2023-10-19: qty 1

## 2023-10-19 MED ORDER — TRANEXAMIC ACID-NACL 1000-0.7 MG/100ML-% IV SOLN
INTRAVENOUS | Status: AC
Start: 2023-10-19 — End: 2023-10-19
  Administered 2023-10-19: 1000 mg via INTRAVENOUS
  Filled 2023-10-19: qty 100

## 2023-10-19 MED ORDER — OXYCODONE HCL 5 MG PO TABS: 5.0000 mg | ORAL_TABLET | ORAL | Status: DC | PRN

## 2023-10-19 MED ORDER — LACTATED RINGERS IV SOLN: INTRAVENOUS | Status: DC

## 2023-10-19 MED ORDER — FENTANYL CITRATE (PF) 100 MCG/2ML IJ SOLN
INTRAMUSCULAR | Status: DC | PRN
Start: 2023-10-19 — End: 2023-10-19
  Administered 2023-10-19 (×2): 50 ug via INTRAVENOUS

## 2023-10-19 MED ORDER — PROPOFOL 1000 MG/100ML IV EMUL
INTRAVENOUS | Status: AC
  Filled 2023-10-19: qty 100

## 2023-10-19 MED ORDER — OXYCODONE HCL 5 MG PO TABS
5.0000 mg | ORAL_TABLET | Freq: Once | ORAL | Status: AC | PRN
  Administered 2023-10-19: 5 mg via ORAL

## 2023-10-19 MED ORDER — OXYCODONE HCL 5 MG PO TABS: 5.0000 mg | ORAL_TABLET | ORAL | 0 refills | Status: DC | PRN

## 2023-10-19 MED ORDER — BUPIVACAINE HCL 0.25 % IJ SOLN
INTRAMUSCULAR | Status: DC | PRN
  Administered 2023-10-19: 30 mL

## 2023-10-19 MED ORDER — FENTANYL CITRATE PF 50 MCG/ML IJ SOSY: 25.0000 ug | PREFILLED_SYRINGE | INTRAMUSCULAR | Status: DC | PRN

## 2023-10-19 MED ORDER — TRANEXAMIC ACID-NACL 1000-0.7 MG/100ML-% IV SOLN
1000.0000 mg | INTRAVENOUS | Status: AC
  Administered 2023-10-19: 1000 mg via INTRAVENOUS
  Filled 2023-10-19: qty 100

## 2023-10-19 MED ORDER — POVIDONE-IODINE 10 % EX SWAB
2.0000 | Freq: Once | CUTANEOUS | Status: AC
  Administered 2023-10-19: 2 via TOPICAL

## 2023-10-19 MED ORDER — LACTATED RINGERS IV BOLUS
500.0000 mL | Freq: Once | INTRAVENOUS | Status: AC
  Administered 2023-10-19: 500 mL via INTRAVENOUS

## 2023-10-19 MED ORDER — BUPIVACAINE HCL 0.25 % IJ SOLN
INTRAMUSCULAR | Status: AC
  Filled 2023-10-19: qty 1

## 2023-10-19 MED ORDER — CEFAZOLIN SODIUM-DEXTROSE 2-4 GM/100ML-% IV SOLN
INTRAVENOUS | Status: AC
  Administered 2023-10-19: 2 g via INTRAVENOUS
  Filled 2023-10-19: qty 100

## 2023-10-19 MED ORDER — KETOROLAC TROMETHAMINE 30 MG/ML IJ SOLN
INTRAMUSCULAR | Status: DC | PRN
  Administered 2023-10-19: 30 mg

## 2023-10-19 MED ORDER — SODIUM CHLORIDE 0.9 % IR SOLN
Status: DC | PRN
  Administered 2023-10-19: 1000 mL

## 2023-10-19 MED ORDER — LACTATED RINGERS IV BOLUS
250.0000 mL | Freq: Once | INTRAVENOUS | Status: AC
  Administered 2023-10-19: 250 mL via INTRAVENOUS

## 2023-10-19 MED ORDER — ONDANSETRON HCL 4 MG/2ML IJ SOLN
INTRAMUSCULAR | Status: AC
  Filled 2023-10-19: qty 2

## 2023-10-19 MED ORDER — BUPIVACAINE IN DEXTROSE 0.75-8.25 % IT SOLN
INTRATHECAL | Status: DC | PRN
  Administered 2023-10-19: 1.8 mL via INTRATHECAL

## 2023-10-19 MED ORDER — DEXAMETHASONE SODIUM PHOSPHATE 10 MG/ML IJ SOLN
INTRAMUSCULAR | Status: DC | PRN
  Administered 2023-10-19: 10 mg

## 2023-10-19 MED ORDER — ROPIVACAINE HCL 5 MG/ML IJ SOLN
INTRAMUSCULAR | Status: DC | PRN
  Administered 2023-10-19: 20 mL via PERINEURAL

## 2023-10-19 MED ORDER — ASPIRIN 325 MG PO TBEC: 325.0000 mg | DELAYED_RELEASE_TABLET | Freq: Two times a day (BID) | ORAL | 0 refills | Status: DC

## 2023-10-19 MED ORDER — LIDOCAINE HCL (PF) 2 % IJ SOLN
INTRAMUSCULAR | Status: AC
  Filled 2023-10-19: qty 5

## 2023-10-19 MED ORDER — TRANEXAMIC ACID-NACL 1000-0.7 MG/100ML-% IV SOLN: 1000.0000 mg | Freq: Once | INTRAVENOUS | Status: AC

## 2023-10-19 MED ORDER — ACETAMINOPHEN 500 MG PO TABS
1000.0000 mg | ORAL_TABLET | Freq: Once | ORAL | Status: AC
  Administered 2023-10-19: 1000 mg via ORAL
  Filled 2023-10-19: qty 2

## 2023-10-19 MED ORDER — CHLORHEXIDINE GLUCONATE 0.12 % MT SOLN
15.0000 mL | Freq: Once | OROMUCOSAL | Status: AC
  Administered 2023-10-19: 15 mL via OROMUCOSAL

## 2023-10-19 MED ORDER — ORAL CARE MOUTH RINSE: 15.0000 mL | Freq: Once | OROMUCOSAL | Status: AC

## 2023-10-19 MED ORDER — KETOROLAC TROMETHAMINE 30 MG/ML IJ SOLN
INTRAMUSCULAR | Status: AC
  Filled 2023-10-19: qty 1

## 2023-10-19 MED ORDER — BACLOFEN 10 MG PO TABS: 10.0000 mg | ORAL_TABLET | Freq: Three times a day (TID) | ORAL | 0 refills | Status: DC

## 2023-10-19 MED ORDER — LIDOCAINE 2% (20 MG/ML) 5 ML SYRINGE
INTRAMUSCULAR | Status: DC | PRN
  Administered 2023-10-19: 50 mg via INTRAVENOUS

## 2023-10-19 MED ORDER — OXYCODONE HCL 5 MG/5ML PO SOLN: 5.0000 mg | Freq: Once | ORAL | Status: AC | PRN

## 2023-10-19 MED ORDER — ONDANSETRON HCL 4 MG/2ML IJ SOLN: 4.0000 mg | Freq: Once | INTRAMUSCULAR | Status: DC | PRN

## 2023-10-19 MED ORDER — CLONIDINE HCL (ANALGESIA) 100 MCG/ML EP SOLN
EPIDURAL | Status: DC | PRN
  Administered 2023-10-19: 50 ug

## 2023-10-19 MED ORDER — ACETAMINOPHEN 10 MG/ML IV SOLN: 1000.0000 mg | Freq: Once | INTRAVENOUS | Status: DC | PRN

## 2023-10-19 MED ORDER — PROPOFOL 10 MG/ML IV BOLUS
INTRAVENOUS | Status: DC | PRN
  Administered 2023-10-19: 20 mg via INTRAVENOUS
  Administered 2023-10-19: 25 ug/kg/min via INTRAVENOUS

## 2023-10-19 MED ORDER — MIDAZOLAM HCL 2 MG/2ML IJ SOLN
INTRAMUSCULAR | Status: AC
  Filled 2023-10-19: qty 2

## 2023-10-19 MED ORDER — WATER FOR IRRIGATION, STERILE IR SOLN
Status: DC | PRN
  Administered 2023-10-19: 1000 mL

## 2023-10-19 MED ORDER — OXYCODONE HCL 5 MG PO TABS
ORAL_TABLET | ORAL | Status: DC
Start: 2023-10-19 — End: 2023-10-19
  Filled 2023-10-19: qty 1

## 2023-10-19 SURGICAL SUPPLY — 49 items
ATTUNE PS FEM RT SZ 4 CEM KNEE (Femur) IMPLANT
BAG COUNTER SPONGE SURGICOUNT (BAG) IMPLANT
BAG ZIPLOCK 12X15 (MISCELLANEOUS) IMPLANT
BASEPLATE TIB CMT FB PCKT SZ5 (Knees) IMPLANT
BLADE SAG 18X100X1.27 (BLADE) ×1 IMPLANT
BLADE SAW SGTL 11.0X1.19X90.0M (BLADE) IMPLANT
BLADE SAW SGTL 13X75X1.27 (BLADE) ×1 IMPLANT
BLADE SURG 15 STRL LF DISP TIS (BLADE) ×1 IMPLANT
BNDG ELASTIC 6X10 VLCR STRL LF (GAUZE/BANDAGES/DRESSINGS) ×1 IMPLANT
BOWL SMART MIX CTS (DISPOSABLE) ×1 IMPLANT
CEMENT HV SMART SET (Cement) ×2 IMPLANT
CLSR STERI-STRIP ANTIMIC 1/2X4 (GAUZE/BANDAGES/DRESSINGS) ×2 IMPLANT
COVER SURGICAL LIGHT HANDLE (MISCELLANEOUS) ×1 IMPLANT
CUFF TRNQT CYL 34X4.125X (TOURNIQUET CUFF) ×1 IMPLANT
DRAPE SHEET LG 3/4 BI-LAMINATE (DRAPES) ×1 IMPLANT
DRAPE U-SHAPE 47X51 STRL (DRAPES) ×1 IMPLANT
DRSG MEPILEX POST OP 4X12 (GAUZE/BANDAGES/DRESSINGS) ×1 IMPLANT
DRSG MEPILEX POST OP 4X8 (GAUZE/BANDAGES/DRESSINGS) IMPLANT
DURAPREP 26ML APPLICATOR (WOUND CARE) ×2 IMPLANT
ELECT REM PT RETURN 15FT ADLT (MISCELLANEOUS) ×1 IMPLANT
GAUZE PAD ABD 8X10 STRL (GAUZE/BANDAGES/DRESSINGS) ×2 IMPLANT
GLOVE BIO SURGEON STRL SZ 6.5 (GLOVE) ×1 IMPLANT
GLOVE BIO SURGEON STRL SZ7.5 (GLOVE) ×1 IMPLANT
GLOVE BIOGEL PI IND STRL 7.0 (GLOVE) ×1 IMPLANT
GLOVE BIOGEL PI IND STRL 8 (GLOVE) ×1 IMPLANT
GOWN STRL SURGICAL XL XLNG (GOWN DISPOSABLE) ×2 IMPLANT
HOLDER FOLEY CATH W/STRAP (MISCELLANEOUS) IMPLANT
IMMOBILIZER KNEE 20 (SOFTGOODS) ×1 IMPLANT
IMMOBILIZER KNEE 20 THIGH 36 (SOFTGOODS) ×1 IMPLANT
INSERT TIBIAL ATTUNE POST 10MM (Knees) IMPLANT
KIT TURNOVER KIT A (KITS) IMPLANT
MANIFOLD NEPTUNE II (INSTRUMENTS) ×1 IMPLANT
NS IRRIG 1000ML POUR BTL (IV SOLUTION) ×1 IMPLANT
PACK TOTAL KNEE CUSTOM (KITS) ×1 IMPLANT
PATELLA MEDIAL ATTUN 35MM KNEE (Knees) IMPLANT
PIN STEINMAN FIXATION KNEE (PIN) IMPLANT
PROTECTOR NERVE ULNAR (MISCELLANEOUS) ×1 IMPLANT
SET HNDPC FAN SPRY TIP SCT (DISPOSABLE) ×1 IMPLANT
SET PAD KNEE POSITIONER (MISCELLANEOUS) ×1 IMPLANT
SPIKE FLUID TRANSFER (MISCELLANEOUS) IMPLANT
SUT MNCRL AB 3-0 PS2 18 (SUTURE) IMPLANT
SUT STRATAFIX PDS+ 0 24IN (SUTURE) ×1 IMPLANT
SUT VIC AB 1 CT1 36 (SUTURE) ×2 IMPLANT
SUT VIC AB 2-0 CT1 TAPERPNT 27 (SUTURE) ×1 IMPLANT
SUT VIC AB 3-0 SH 27X BRD (SUTURE) IMPLANT
TRAY FOLEY MTR SLVR 16FR STAT (SET/KITS/TRAYS/PACK) ×1 IMPLANT
TUBE SUCTION HIGH CAP CLEAR NV (SUCTIONS) ×1 IMPLANT
WATER STERILE IRR 1000ML POUR (IV SOLUTION) ×2 IMPLANT
WRAP KNEE MAXI GEL POST OP (GAUZE/BANDAGES/DRESSINGS) ×1 IMPLANT

## 2023-10-19 NOTE — Discharge Instructions (Signed)
 Diet: As you were doing prior to hospitalization   Shower:  May shower but keep the wounds dry, use an occlusive plastic wrap, NO SOAKING IN TUB.  If the bandage gets wet, change with a clean dry gauze.  If you have a splint on, leave the splint in place and keep the splint dry with a plastic bag.  Dressing:  You may change your dressing 3-5 days after surgery, unless you have a splint.  If you have a splint, then just leave the splint in place and we will change your bandages during your first follow-up appointment.    If you had hand or foot surgery, we will plan to remove your stitches in about 2 weeks in the office.  For all other surgeries, there are sticky tapes (steri-strips) on your wounds and all the stitches are absorbable.  Leave the steri-strips in place when changing your dressings, they will peel off with time, usually 2-3 weeks.  Activity:  Increase activity slowly as tolerated, but follow the weight bearing instructions below.  The rules on driving is that you can not be taking narcotics while you drive, and you must feel in control of the vehicle.    Weight Bearing:   as tolerated.    To prevent constipation: you may use a stool softener such as -  Colace (over the counter) 100 mg by mouth twice a day  Drink plenty of fluids (prune juice may be helpful) and high fiber foods Miralax (over the counter) for constipation as needed.    Itching:  If you experience itching with your medications, try taking only a single pain pill, or even half a pain pill at a time.  You may take up to 10 pain pills per day, and you can also use benadryl over the counter for itching or also to help with sleep.   Precautions:  If you experience chest pain or shortness of breath - call 911 immediately for transfer to the hospital emergency department!!  If you develop a fever greater that 101 F, purulent drainage from wound, increased redness or drainage from wound, or calf pain -- Call the office at  [phone]                                                Follow- Up Appointment:  Please call for an appointment to be seen in 2 weeks [place] - [phone]

## 2023-10-19 NOTE — Anesthesia Procedure Notes (Signed)
 Anesthesia Regional Block: Adductor canal block   Pre-Anesthetic Checklist: , timeout performed,  Correct Patient, Correct Site, Correct Laterality,  Correct Procedure, Correct Position, site marked,  Risks and benefits discussed,  Surgical consent,  Pre-op evaluation,  At surgeon's request and post-op pain management  Laterality: Right  Prep: Maximum Sterile Barrier Precautions used, chloraprep       Needles:  Injection technique: Single-shot  Needle Type: Echogenic Needle      Needle Gauge: 20     Additional Needles:   Procedures:,,,, ultrasound used (permanent image in chart),,    Narrative:  Start time: 10/19/2023 7:00 AM End time: 10/19/2023 7:05 AM Injection made incrementally with aspirations every 5 mL.  Performed by: Personally  Anesthesiologist: Mariann Barter, MD

## 2023-10-19 NOTE — Anesthesia Procedure Notes (Signed)
 Spinal  Patient location during procedure: OR Reason for block: surgical anesthesia Staffing Performed: anesthesiologist  Anesthesiologist: Mariann Barter, MD Performed by: Mariann Barter, MD Authorized by: Mariann Barter, MD   Preanesthetic Checklist Completed: patient identified, IV checked, site marked, risks and benefits discussed, surgical consent, monitors and equipment checked, pre-op evaluation and timeout performed Spinal Block Patient position: sitting Prep: DuraPrep Patient monitoring: heart rate, cardiac monitor, continuous pulse ox and blood pressure Approach: midline Location: L3-4 Injection technique: single-shot Needle Needle type: Sprotte  Needle gauge: 24 G Needle length: 9 cm Assessment Sensory level: T4 Events: CSF return

## 2023-10-19 NOTE — Evaluation (Signed)
 Physical Therapy Evaluation Patient Details Name: Valerie [name] MRN: [id number] DOB: 12-16-1960 Today's Date: 10/19/2023  History of Present Illness  63 yo female presents to therapy s/p R TKA on 10/19/2023 due to failure of conservative measures. Pt PMH includes but is not limited to: anemia, anxiety, psoriatic arthritis, bipolar disorder, DCK III, dementia, depression, DM II, hyperthyroidism, neuropathy, NSTEMI, OSA, sigmoid colon stricture, CVA, and B THA.  Clinical Impression    Valerie [name] is a 63 y.o. female POD 0 s/p R TKA. Patient reports occasional use of SPC with mobility at baseline. Patient is now limited by functional impairments (see PT problem list below) and requires CGA and cues for transfers and gait with RW. Patient was able to ambulate 40 feet with RW and CGA and cues for safe walker management. Patient educated on safe sequencing for stair mobility with RW, fall risk prevention, slowly increasing activity, use of RW, CP/ice man machine, pain management and goal and car transfers pt and family verbalized understanding of safe guarding position for people assisting with mobility. Patient instructed in exercises to facilitate ROM and circulation reviewed and HO provided. Patient will benefit from continued skilled PT interventions to address impairments and progress towards PLOF. Patient has met mobility goals at adequate level for discharge home with family support and OPPT services; will continue to follow if pt continues acute stay to progress towards Mod I goals.       If plan is discharge home, recommend the following: A little help with walking and/or transfers;A little help with bathing/dressing/bathroom;Assistance with cooking/housework;Assist for transportation;Help with stairs or ramp for entrance   Can travel by private vehicle        Equipment Recommendations None recommended by PT  Recommendations for Other Services       Functional Status Assessment Patient  has had a recent decline in their functional status and demonstrates the ability to make significant improvements in function in a reasonable and predictable amount of time.     Precautions / Restrictions Precautions Precautions: Knee;Fall Restrictions Weight Bearing Restrictions Per Provider Order: No      Mobility  Bed Mobility Overal bed mobility: Needs Assistance Bed Mobility: Supine to Sit     Supine to sit: Supervision, HOB elevated     General bed mobility comments: min cues    Transfers Overall transfer level: Needs assistance Equipment used: Rolling walker (2 wheels) Transfers: Sit to/from Stand Sit to Stand: Contact guard assist           General transfer comment: min cues for safety and proper UE and AD placement    Ambulation/Gait Ambulation/Gait assistance: Contact guard assist Gait Distance (Feet): 40 Feet Assistive device: Rolling walker (2 wheels) Gait Pattern/deviations: Step-to pattern, Antalgic, Trunk flexed Gait velocity: decreased     General Gait Details: slight trunk flexion with B UE support at RW to offload R LE in stance phase, pt c/o of R LE spasms with gait tasks and one episode of R LE instabiltiy and pt able to compensate with B UE support, min cues for safety and slow and controlled movement  Stairs Stairs: Yes Stairs assistance: Contact guard assist Stair Management: Two rails Number of Stairs: 3 General stair comments: step navigation instruction provided on use of RW to navigate one step to enter home, pt able to lift and place RW on adaptive steps with CGA, pt traversed steps with cues and CGA using B handrails.  Wheelchair Mobility     Tilt Bed    Modified  Rankin (Stroke Patients Only)       Balance Overall balance assessment: History of Falls, Needs assistance (8 falls in the past 6 months) Sitting-balance support: Feet supported Sitting balance-Leahy Scale: Good     Standing balance support: Bilateral upper  extremity supported, During functional activity, Reliant on assistive device for balance Standing balance-Leahy Scale: Poor                               Pertinent Vitals/Pain Pain Assessment Pain Assessment: 0-10 Pain Score: 0-No pain Pain Location: R knee Pain Descriptors / Indicators: Spasm, Sore Pain Intervention(s): Limited activity within patient's tolerance, Monitored during session, Premedicated before session, Repositioned, Ice applied, Other (comment) (pt requesting mm relaxer)    Home Living Family/patient expects to be discharged to:: Private residence Living Arrangements: Alone Available Help at Discharge: Available PRN/intermittently (sister) Type of Home: House (town home) Home Access: Stairs to enter Entrance Stairs-Rails: None Secretary/administrator of Steps: 1 Alternate Level Stairs-Number of Steps: 1 flight Home Layout: Two level;1/2 bath on main level (shower up stairs) Home Equipment: Agricultural consultant (2 wheels);BSC/3in1;Cane - single point;Rollator (4 wheels)      Prior Function Prior Level of Function : Independent/Modified Independent             Mobility Comments: mod I with SPC for all ADLs, self care tasks and IADLs       Extremity/Trunk Assessment        Lower Extremity Assessment Lower Extremity Assessment: RLE deficits/detail RLE Deficits / Details: ankle DF/PF 5/5; SLR , 10 degree lag RLE Sensation: WNL    Cervical / Trunk Assessment Cervical / Trunk Assessment: Back Surgery  Communication   Communication Communication: No apparent difficulties    Cognition Arousal: Alert Behavior During Therapy: WFL for tasks assessed/performed   PT - Cognitive impairments: No apparent impairments                         Following commands: Intact       Cueing       General Comments      Exercises Total Joint Exercises Ankle Circles/Pumps: AROM, Both, 10 reps Quad Sets: AROM, Right, 5 reps Short Arc Quad:  AROM, Right, 5 reps Heel Slides: AROM, Right, 5 reps Hip ABduction/ADduction: AROM, Right, 5 reps Straight Leg Raises: AROM, Right, 5 reps Knee Flexion: AROM, Right, 5 reps   Assessment/Plan    PT Assessment Patient needs continued PT services  PT Problem List Decreased strength;Decreased range of motion;Decreased activity tolerance;Decreased balance;Decreased mobility;Decreased coordination;Pain       PT Treatment Interventions DME instruction;Gait training;Stair training;Functional mobility training;Therapeutic activities;Therapeutic exercise;Balance training;Neuromuscular re-education;Patient/family education;Modalities    PT Goals (Current goals can be found in the Care Plan section)  Acute Rehab PT Goals Patient Stated Goal: to be wild and IND as possible PT Goal Formulation: With patient Time For Goal Achievement: 11/02/23 Potential to Achieve Goals: Good    Frequency 7X/week     Co-evaluation               AM-PAC PT "6 Clicks" Mobility  Outcome Measure Help needed turning from your back to your side while in a flat bed without using bedrails?: None Help needed moving from lying on your back to sitting on the side of a flat bed without using bedrails?: A Little Help needed moving to and from a bed to a chair (including a wheelchair)?: A  Little Help needed standing up from a chair using your arms (e.g., wheelchair or bedside chair)?: A Little Help needed to walk in hospital room?: A Little Help needed climbing 3-5 steps with a railing? : A Little 6 Click Score: 19    End of Session Equipment Utilized During Treatment: Gait belt Activity Tolerance: Patient tolerated treatment well;No increased pain Patient left: in chair;with call bell/phone within reach;with family/visitor present Nurse Communication: Mobility status;Other (comment) (pt requesting mm relaxer and ready for d/c from PT perspective) PT Visit Diagnosis: Unsteadiness on feet (R26.81);Other  abnormalities of gait and mobility (R26.89);Repeated falls (R29.6);Muscle weakness (generalized) (M62.81);Difficulty in walking, not elsewhere classified (R26.2);Pain Pain - Right/Left: Right Pain - part of body: Knee;Leg    Time: 1610-9604 PT Time Calculation (min) (ACUTE ONLY): 43 min   Charges:   PT Evaluation $PT Eval Low Complexity: 1 Low PT Treatments $Gait Training: 8-22 mins $Therapeutic Exercise: 8-22 mins PT General Charges $$ ACUTE PT VISIT: 1 Visit         Johnny Bridge, PT Acute Rehab    Jacqualyn Posey 10/19/2023, 2:05 PM

## 2023-10-19 NOTE — Op Note (Signed)
 DATE OF SURGERY:  10/19/2023 TIME: 9:10 AM  PATIENT NAME:  Valerie [name]   AGE: 63 y.o.    PRE-OPERATIVE DIAGNOSIS: Right knee primary localized osteoarthritis  POST-OPERATIVE DIAGNOSIS:  Same  PROCEDURE: Right total Knee Arthroplasty  SURGEON:  Eulas Post, MD   ASSISTANT: Dan Humphreys PA-C, present and scrubbed throughout the case, critical for assistance with exposure, retraction, instrumentation, and closure.  Second Assistant: Darron Doom, RNFA  OPERATIVE IMPLANTS:  Implant Name Type Inv. Item Serial No. Manufacturer Lot No. LRB No. Used Action  CEMENT HV SMART SET - [id number] Cement CEMENT HV SMART SET  [employer] [id number] Right 2 Implanted  BASEPLATE TIB CMT FB PCKT SZ5 - [id number] Knees BASEPLATE TIB CMT FB PCKT SZ5  [employer] [id number] Right 1 Implanted  ATTUNE PS FEM RT SZ 4 CEM KNEE - [id number] Femur ATTUNE PS FEM RT SZ 4 CEM KNEE  [employer] [id number] Right 1 Implanted  PATELLA MEDIAL ATTUN KNEE - [id number] Knees PATELLA MEDIAL ATTUN KNEE  DEPU[employer] [id number] Right 1 Implanted  INSERT TIBIAL ATTUNE POST - [id number] Knees INSERT TIBIAL ATTUNE POST  DEPU[employer]TH[employer] [id number] Right 1 Implanted     PREOPERATIVE INDICATIONS:  Valerie [name] is a 63 y.o. year old female with end stage bone on bone degenerative arthritis of the knee who failed conservative treatment, including injections, antiinflammatories, activity modification, and assistive devices, and had significant impairment of their activities of daily living, and elected for Total Knee Arthroplasty.   The risks, benefits, and alternatives were discussed at length including but not limited to the risks of infection, bleeding, nerve injury, stiffness, blood clots, the need for revision surgery, cardiopulmonary complications, among others, and they were willing to proceed.  OPERATIVE FINDINGS AND UNIQUE ASPECTS OF THE CASE: She had advanced arthrosis of both  the medial and lateral femoral condyles.  The patella was not terrible, her bone quality was quite poor.  The lamina spreader left a significant indentation in the tibial plateau, which was filled in with the baseplate, as well as cement.  Her anatomy was on the smaller side, the size 4 on the femur was fine, although it could have even been a 3 but I did not want to take that much bone posteriorly.  On the tibial side she had a lot of osteophyte, initially she would like a 6, but after removing the osteophytes she was more of a 5, and I wanted to have significant tibial coverage given her bone quality.  I ended up going to a size 10 on the polyethylene to optimize stability both in extension and flexion, even though my cuts were fairly minimal.  ESTIMATED BLOOD LOSS: 150 mL  OPERATIVE DESCRIPTION:  The patient was brought to the operative room and placed in a supine position.  Anesthesia was administered.  IV antibiotics were given.  The lower extremity was prepped and draped in the usual sterile fashion.  Time out was performed.  The leg was elevated and exsanguinated and the tourniquet was inflated.  Anterior quadriceps tendon splitting approach was performed.  The patella was everted and osteophytes were removed.  The anterior horn of the medial and lateral meniscus was removed.   The patella was then measured, and cut with the saw.  The thickness before the cut was 24 and after the cut was 14.  A metal shield was used to protect the patella throughout the case.    The distal femur was opened with the drill  and the intramedullary distal femoral cutting jig was utilized, set at 5 degrees resecting 9 mm off the distal femur.  Care was taken to protect the collateral ligaments.  Then the extramedullary tibial cutting jig was utilized making the appropriate cut using the anterior tibial crest as a reference building in appropriate posterior slope.  Care was taken during the cut to protect the medial  and collateral ligaments.  The proximal tibia was removed along with the posterior horns of the menisci.  The PCL was sacrificed.    The extensor gap was measured and found to have adequate resection, measuring to a size at least 8.    The distal femoral sizing jig was applied, taking care to avoid notching.  This was set at 3 degrees of external rotation.  Then the 4-in-1 cutting jig was applied and the anterior and posterior femur was cut, along with the chamfer cuts.  All posterior osteophytes were removed.  The flexion gap was then measured and was symmetric with the extension gap.  I completed the distal femoral preparation using the appropriate jig to prepare the box.  The proximal tibia sized and prepared accordingly with the reamer and the punch, and then all components were trialed with the poly insert.  The knee was found to have excellent balance and full motion.    The above named components were then cemented into place and all excess cement was removed.  The real polyethylene implant was placed.  After the cement had cured I released the tourniquet and confirmed excellent hemostasis with no major posterior vessel injury.    The knee was easily taken through a range of motion and the patella tracked well and the knee irrigated copiously and the parapatellar and subcutaneous tissue closed with vicryl, and monocryl with steri strips for the skin.  The wounds were injected with marcaine, and dressed with sterile gauze and the patient was awakened and returned to the PACU in stable and satisfactory condition.  There were no complications.  Total tourniquet time was approximately 70 minutes.

## 2023-10-19 NOTE — Transfer of Care (Signed)
 Immediate Anesthesia Transfer of Care Note  [name]  Procedure(s) Performed: TOTAL KNEE ARTHROPLASTY (Right: Knee)  Patient Location: PACU  Anesthesia Type:Spinal  Level of Consciousness: awake, alert , and oriented  Airway & Oxygen Therapy: Patient Spontanous Breathing and Patient connected to face mask oxygen  Post-op Assessment: Report given to RN and Post -op Vital signs reviewed and stable  Post vital signs: Reviewed and stable  Last Vitals:  Vitals Value Taken Time  BP 119/53 10/19/23 0950  Temp    Pulse 60 10/19/23 0952  Resp 15 10/19/23 0952  SpO2 95 % 10/19/23 0952  Vitals shown include unfiled device data.  Last Pain:  Vitals:   10/19/23 0634  TempSrc:   PainSc: 0-No pain      Patients Stated Pain Goal: 4 (10/19/23 6213)  Complications: No notable events documented.

## 2023-10-19 NOTE — Anesthesia Postprocedure Evaluation (Signed)
 Anesthesia Post Note  Patient: Nurse, mental health  Procedure(s) Performed: TOTAL KNEE ARTHROPLASTY (Right: Knee)     Patient location during evaluation: PACU Anesthesia Type: MAC and Spinal Level of consciousness: awake and alert Pain management: pain level controlled Vital Signs Assessment: post-procedure vital signs reviewed and stable Respiratory status: spontaneous breathing, nonlabored ventilation, respiratory function stable and patient connected to nasal cannula oxygen Cardiovascular status: stable and blood pressure returned to baseline Postop Assessment: no apparent nausea or vomiting Anesthetic complications: no   No notable events documented.  Last Vitals:  Vitals:   10/19/23 1145 10/19/23 1200  BP:  133/68  Pulse: 62 61  Resp:  20  Temp:  36.4 C  SpO2: 99% 94%    Last Pain:  Vitals:   10/19/23 1200  TempSrc:   PainSc: 0-No pain                 Mariann Barter

## 2023-10-19 NOTE — Anesthesia Preprocedure Evaluation (Signed)
 Anesthesia Evaluation  Patient identified by MRN, date of birth, ID band Patient awake    Reviewed: Allergy & Precautions, NPO status , Patient's Chart, lab work & pertinent test results, reviewed documented beta blocker date and time   History of Anesthesia Complications Negative for: history of anesthetic complications  Airway Mallampati: II  TM Distance: >3 FB     Dental no notable dental hx. (+) Poor Dentition   Pulmonary shortness of breath, sleep apnea and Continuous Positive Airway Pressure Ventilation , neg COPD, former smoker   breath sounds clear to auscultation       Cardiovascular hypertension, + CAD and + Past MI  (-) Cardiac Stents, (-) CABG, (-) Peripheral Vascular Disease and (-) CHF  Rhythm:Regular Rate:Normal     Neuro/Psych  Headaches, Seizures -, Well Controlled,  PSYCHIATRIC DISORDERS Anxiety Depression Bipolar Disorder  Dementia  Neuromuscular disease CVA, Residual Symptoms    GI/Hepatic ,GERD  ,,(+) neg Cirrhosis        Endo/Other  diabetes, Type 2 Hyperthyroidism   Renal/GU CRFRenal disease     Musculoskeletal  (+) Arthritis ,    Abdominal   Peds  Hematology  (+) Blood dyscrasia, anemia   Anesthesia Other Findings   Reproductive/Obstetrics                              Anesthesia Physical Anesthesia Plan  ASA: 2  Anesthesia Plan: Spinal and MAC   Post-op Pain Management: Regional block*   Induction: Intravenous  PONV Risk Score and Plan: 2 and Ondansetron and Dexamethasone  Airway Management Planned: Simple Face Mask and Natural Airway  Additional Equipment:   Intra-op Plan:   Post-operative Plan:   Informed Consent: I have reviewed the patients History and Physical, chart, labs and discussed the procedure including the risks, benefits and alternatives for the proposed anesthesia with the patient or authorized representative who has indicated his/her  understanding and acceptance.     Dental advisory given  Plan Discussed with: CRNA  Anesthesia Plan Comments:          Anesthesia Quick Evaluation

## 2023-10-19 NOTE — Interval H&P Note (Signed)
 History and Physical Interval Note:  10/19/2023 7:20 AM  Valerie [name]  has presented today for surgery, with the diagnosis of RIGHT KNEE OA.  The various methods of treatment have been discussed with the patient and family. After consideration of risks, benefits and other options for treatment, the patient has consented to  Procedure(s): TOTAL KNEE ARTHROPLASTY (Right) as a surgical intervention.  The patient's history has been reviewed, patient examined, no change in status, stable for surgery.  I have reviewed the patient's chart and labs.  Questions were answered to the patient's satisfaction.     Eulas Post

## 2023-10-20 ENCOUNTER — Encounter (HOSPITAL_COMMUNITY): Payer: Self-pay | Admitting: Orthopedic Surgery

## 2023-10-21 ENCOUNTER — Observation Stay (HOSPITAL_COMMUNITY)
Admission: EM | Admit: 2023-10-21 | Discharge: 2023-10-24 | Disposition: A | Discharge status: Home / Self Care | Payer: Medicare Other | Attending: Emergency Medicine | Admitting: Emergency Medicine

## 2023-10-21 ENCOUNTER — Emergency Department (HOSPITAL_COMMUNITY): Payer: Medicare Other

## 2023-10-21 ENCOUNTER — Observation Stay (HOSPITAL_COMMUNITY): Payer: Medicare Other

## 2023-10-21 ENCOUNTER — Other Ambulatory Visit: Payer: Self-pay

## 2023-10-21 ENCOUNTER — Encounter (HOSPITAL_COMMUNITY): Payer: Self-pay

## 2023-10-21 DIAGNOSIS — J9601 Acute respiratory failure with hypoxia: Secondary | ICD-10-CM | POA: Diagnosis present

## 2023-10-21 DIAGNOSIS — W19XXXA Unspecified fall, initial encounter: Secondary | ICD-10-CM

## 2023-10-21 DIAGNOSIS — E785 Hyperlipidemia, unspecified: Secondary | ICD-10-CM | POA: Diagnosis not present

## 2023-10-21 DIAGNOSIS — S92412A Displaced fracture of proximal phalanx of left great toe, initial encounter for closed fracture: Secondary | ICD-10-CM | POA: Insufficient documentation

## 2023-10-21 DIAGNOSIS — G9340 Encephalopathy, unspecified: Secondary | ICD-10-CM

## 2023-10-21 DIAGNOSIS — I2510 Atherosclerotic heart disease of native coronary artery without angina pectoris: Secondary | ICD-10-CM | POA: Diagnosis not present

## 2023-10-21 DIAGNOSIS — G40909 Epilepsy, unspecified, not intractable, without status epilepticus: Secondary | ICD-10-CM | POA: Diagnosis not present

## 2023-10-21 DIAGNOSIS — E1010 Type 1 diabetes mellitus with ketoacidosis without coma: Secondary | ICD-10-CM | POA: Insufficient documentation

## 2023-10-21 DIAGNOSIS — G9341 Metabolic encephalopathy: Secondary | ICD-10-CM | POA: Diagnosis not present

## 2023-10-21 DIAGNOSIS — Z794 Long term (current) use of insulin: Secondary | ICD-10-CM | POA: Diagnosis not present

## 2023-10-21 DIAGNOSIS — I129 Hypertensive chronic kidney disease with stage 1 through stage 4 chronic kidney disease, or unspecified chronic kidney disease: Secondary | ICD-10-CM | POA: Diagnosis not present

## 2023-10-21 DIAGNOSIS — F419 Anxiety disorder, unspecified: Secondary | ICD-10-CM | POA: Diagnosis present

## 2023-10-21 DIAGNOSIS — N390 Urinary tract infection, site not specified: Secondary | ICD-10-CM

## 2023-10-21 DIAGNOSIS — N1832 Chronic kidney disease, stage 3b: Secondary | ICD-10-CM | POA: Diagnosis present

## 2023-10-21 DIAGNOSIS — Z91040 Latex allergy status: Secondary | ICD-10-CM | POA: Insufficient documentation

## 2023-10-21 DIAGNOSIS — J90 Pleural effusion, not elsewhere classified: Secondary | ICD-10-CM | POA: Insufficient documentation

## 2023-10-21 DIAGNOSIS — Y92009 Unspecified place in unspecified non-institutional (private) residence as the place of occurrence of the external cause: Secondary | ICD-10-CM

## 2023-10-21 DIAGNOSIS — G4733 Obstructive sleep apnea (adult) (pediatric): Secondary | ICD-10-CM | POA: Diagnosis present

## 2023-10-21 DIAGNOSIS — F319 Bipolar disorder, unspecified: Secondary | ICD-10-CM | POA: Diagnosis present

## 2023-10-21 DIAGNOSIS — I700 Atherosclerosis of aorta: Secondary | ICD-10-CM | POA: Diagnosis not present

## 2023-10-21 DIAGNOSIS — S92512A Displaced fracture of proximal phalanx of left lesser toe(s), initial encounter for closed fracture: Secondary | ICD-10-CM | POA: Insufficient documentation

## 2023-10-21 DIAGNOSIS — N3000 Acute cystitis without hematuria: Secondary | ICD-10-CM | POA: Diagnosis not present

## 2023-10-21 DIAGNOSIS — R2689 Other abnormalities of gait and mobility: Secondary | ICD-10-CM | POA: Insufficient documentation

## 2023-10-21 DIAGNOSIS — E1122 Type 2 diabetes mellitus with diabetic chronic kidney disease: Secondary | ICD-10-CM | POA: Diagnosis not present

## 2023-10-21 DIAGNOSIS — Z9181 History of falling: Secondary | ICD-10-CM | POA: Insufficient documentation

## 2023-10-21 DIAGNOSIS — J101 Influenza due to other identified influenza virus with other respiratory manifestations: Secondary | ICD-10-CM | POA: Insufficient documentation

## 2023-10-21 DIAGNOSIS — K219 Gastro-esophageal reflux disease without esophagitis: Secondary | ICD-10-CM | POA: Diagnosis present

## 2023-10-21 DIAGNOSIS — L4050 Arthropathic psoriasis, unspecified: Secondary | ICD-10-CM | POA: Diagnosis present

## 2023-10-21 DIAGNOSIS — E1065 Type 1 diabetes mellitus with hyperglycemia: Principal | ICD-10-CM | POA: Insufficient documentation

## 2023-10-21 DIAGNOSIS — E1169 Type 2 diabetes mellitus with other specified complication: Secondary | ICD-10-CM | POA: Diagnosis present

## 2023-10-21 DIAGNOSIS — N179 Acute kidney failure, unspecified: Secondary | ICD-10-CM | POA: Insufficient documentation

## 2023-10-21 DIAGNOSIS — Z1152 Encounter for screening for COVID-19: Secondary | ICD-10-CM | POA: Diagnosis not present

## 2023-10-21 DIAGNOSIS — E66812 Obesity, class 2: Secondary | ICD-10-CM | POA: Diagnosis present

## 2023-10-21 DIAGNOSIS — E1140 Type 2 diabetes mellitus with diabetic neuropathy, unspecified: Secondary | ICD-10-CM | POA: Diagnosis not present

## 2023-10-21 DIAGNOSIS — F0284 Dementia in other diseases classified elsewhere, unspecified severity, with anxiety: Secondary | ICD-10-CM | POA: Insufficient documentation

## 2023-10-21 DIAGNOSIS — R509 Fever, unspecified: Secondary | ICD-10-CM | POA: Diagnosis present

## 2023-10-21 DIAGNOSIS — G20A1 Parkinson's disease without dyskinesia, without mention of fluctuations: Secondary | ICD-10-CM | POA: Diagnosis present

## 2023-10-21 DIAGNOSIS — G629 Polyneuropathy, unspecified: Secondary | ICD-10-CM

## 2023-10-21 DIAGNOSIS — R918 Other nonspecific abnormal finding of lung field: Secondary | ICD-10-CM | POA: Diagnosis not present

## 2023-10-21 DIAGNOSIS — I10 Essential (primary) hypertension: Secondary | ICD-10-CM | POA: Diagnosis present

## 2023-10-21 DIAGNOSIS — Z79899 Other long term (current) drug therapy: Secondary | ICD-10-CM | POA: Insufficient documentation

## 2023-10-21 DIAGNOSIS — R4182 Altered mental status, unspecified: Secondary | ICD-10-CM | POA: Diagnosis present

## 2023-10-21 DIAGNOSIS — E119 Type 2 diabetes mellitus without complications: Secondary | ICD-10-CM

## 2023-10-21 DIAGNOSIS — N183 Chronic kidney disease, stage 3 unspecified: Secondary | ICD-10-CM | POA: Diagnosis present

## 2023-10-21 LAB — CBC WITH DIFFERENTIAL/PLATELET
Abs Immature Granulocytes: 0.06 10*3/uL (ref 0.00–0.07)
Basophils Absolute: 0.1 10*3/uL (ref 0.0–0.1)
Basophils Relative: 1 %
Eosinophils Absolute: 0.2 10*3/uL (ref 0.0–0.5)
Eosinophils Relative: 2 %
HCT: 40.2 % (ref 36.0–46.0)
Hemoglobin: 13.4 g/dL (ref 12.0–15.0)
Immature Granulocytes: 1 %
Lymphocytes Relative: 12 %
Lymphs Abs: 1.1 10*3/uL (ref 0.7–4.0)
MCH: 32.3 pg (ref 26.0–34.0)
MCHC: 33.3 g/dL (ref 30.0–36.0)
MCV: 96.9 fL (ref 80.0–100.0)
Monocytes Absolute: 0.7 10*3/uL (ref 0.1–1.0)
Monocytes Relative: 8 %
Neutro Abs: 7.2 10*3/uL (ref 1.7–7.7)
Neutrophils Relative %: 76 %
Platelets: 106 10*3/uL — ABNORMAL LOW (ref 150–400)
RBC: 4.15 MIL/uL (ref 3.87–5.11)
RDW: 14 % (ref 11.5–15.5)
WBC: 9.3 10*3/uL (ref 4.0–10.5)
nRBC: 0 % (ref 0.0–0.2)

## 2023-10-21 LAB — URINALYSIS, ROUTINE W REFLEX MICROSCOPIC
Bilirubin Urine: NEGATIVE
Glucose, UA: NEGATIVE mg/dL
Hgb urine dipstick: NEGATIVE
Ketones, ur: NEGATIVE mg/dL
Nitrite: POSITIVE — AB
Protein, ur: NEGATIVE mg/dL
Specific Gravity, Urine: 1.008 (ref 1.005–1.030)
pH: 5 (ref 5.0–8.0)

## 2023-10-21 LAB — COMPREHENSIVE METABOLIC PANEL
ALT: 6 U/L (ref 0–44)
AST: 20 U/L (ref 15–41)
Albumin: 3.4 g/dL — ABNORMAL LOW (ref 3.5–5.0)
Alkaline Phosphatase: 72 U/L (ref 38–126)
Anion gap: 14 (ref 5–15)
BUN: 21 mg/dL (ref 8–23)
CO2: 24 mmol/L (ref 22–32)
Calcium: 8.9 mg/dL (ref 8.9–10.3)
Chloride: 102 mmol/L (ref 98–111)
Creatinine, Ser: 1.4 mg/dL — ABNORMAL HIGH (ref 0.44–1.00)
GFR, Estimated: 42 mL/min — ABNORMAL LOW (ref >60)
Glucose, Bld: 134 mg/dL — ABNORMAL HIGH (ref 70–99)
Potassium: 4.2 mmol/L (ref 3.5–5.1)
Sodium: 140 mmol/L (ref 135–145)
Total Bilirubin: 0.5 mg/dL (ref 0.0–1.2)
Total Protein: 6.4 g/dL — ABNORMAL LOW (ref 6.5–8.1)

## 2023-10-21 LAB — HEMOGLOBIN A1C
Hgb A1c MFr Bld: 5.3 % (ref 4.8–5.6)
Mean Plasma Glucose: 105.41 mg/dL

## 2023-10-21 LAB — AMMONIA: Ammonia: 29 umol/L (ref 9–35)

## 2023-10-21 LAB — RESP PANEL BY RT-PCR (RSV, FLU A&B, COVID)  RVPGX2
Influenza A by PCR: NEGATIVE
Influenza B by PCR: NEGATIVE
Resp Syncytial Virus by PCR: NEGATIVE
SARS Coronavirus 2 by RT PCR: NEGATIVE

## 2023-10-21 LAB — CBG MONITORING, ED: Glucose-Capillary: 114 mg/dL — ABNORMAL HIGH (ref 70–99)

## 2023-10-21 LAB — GLUCOSE, CAPILLARY: Glucose-Capillary: 140 mg/dL — ABNORMAL HIGH (ref 70–99)

## 2023-10-21 LAB — TROPONIN I (HIGH SENSITIVITY)
Troponin I (High Sensitivity): 12 ng/L (ref <18)
Troponin I (High Sensitivity): 11 ng/L (ref <18)

## 2023-10-21 MED ORDER — OXYCODONE HCL 5 MG PO TABS
5.0000 mg | ORAL_TABLET | ORAL | Status: DC | PRN
  Administered 2023-10-21 – 2023-10-24 (×7): 5 mg via ORAL
  Filled 2023-10-21 (×7): qty 1

## 2023-10-21 MED ORDER — TRAZODONE HCL 50 MG PO TABS
200.0000 mg | ORAL_TABLET | Freq: Every evening | ORAL | Status: DC | PRN
  Administered 2023-10-22 – 2023-10-23 (×2): 200 mg via ORAL
  Filled 2023-10-21 (×2): qty 4

## 2023-10-21 MED ORDER — POLYETHYLENE GLYCOL 3350 17 G PO PACK
17.0000 g | PACK | Freq: Every day | ORAL | Status: DC | PRN
  Filled 2023-10-21: qty 1

## 2023-10-21 MED ORDER — INSULIN ASPART 100 UNIT/ML IJ SOLN
0.0000 [IU] | Freq: Three times a day (TID) | INTRAMUSCULAR | Status: DC
  Administered 2023-10-23: 2 [IU] via SUBCUTANEOUS

## 2023-10-21 MED ORDER — SODIUM CHLORIDE 0.9 % IV BOLUS
500.0000 mL | Freq: Once | INTRAVENOUS | Status: AC
  Administered 2023-10-21: 500 mL via INTRAVENOUS

## 2023-10-21 MED ORDER — MEMANTINE HCL 10 MG PO TABS
10.0000 mg | ORAL_TABLET | Freq: Two times a day (BID) | ORAL | Status: DC
  Administered 2023-10-21 – 2023-10-24 (×6): 10 mg via ORAL
  Filled 2023-10-21 (×6): qty 1

## 2023-10-21 MED ORDER — ASPIRIN 81 MG PO TBEC
81.0000 mg | DELAYED_RELEASE_TABLET | Freq: Every day | ORAL | Status: DC
  Administered 2023-10-22 – 2023-10-24 (×3): 81 mg via ORAL
  Filled 2023-10-21 (×3): qty 1

## 2023-10-21 MED ORDER — ACETAMINOPHEN 650 MG RE SUPP: 650.0000 mg | Freq: Four times a day (QID) | RECTAL | Status: DC | PRN

## 2023-10-21 MED ORDER — PRIMIDONE 50 MG PO TABS
100.0000 mg | ORAL_TABLET | Freq: Two times a day (BID) | ORAL | Status: DC
  Administered 2023-10-21 – 2023-10-24 (×6): 100 mg via ORAL
  Filled 2023-10-21 (×7): qty 2

## 2023-10-21 MED ORDER — LEVETIRACETAM 500 MG PO TABS
750.0000 mg | ORAL_TABLET | Freq: Two times a day (BID) | ORAL | Status: DC
  Administered 2023-10-21 – 2023-10-24 (×6): 750 mg via ORAL
  Filled 2023-10-21 (×6): qty 1

## 2023-10-21 MED ORDER — KETOROLAC TROMETHAMINE 30 MG/ML IJ SOLN
30.0000 mg | Freq: Once | INTRAMUSCULAR | Status: AC
  Administered 2023-10-21: 30 mg via INTRAVENOUS
  Filled 2023-10-21: qty 1

## 2023-10-21 MED ORDER — FLUOXETINE HCL 20 MG PO CAPS
80.0000 mg | ORAL_CAPSULE | Freq: Every day | ORAL | Status: DC
  Administered 2023-10-22 – 2023-10-24 (×3): 80 mg via ORAL
  Filled 2023-10-21 (×3): qty 4

## 2023-10-21 MED ORDER — DOCUSATE SODIUM 100 MG PO CAPS
100.0000 mg | ORAL_CAPSULE | Freq: Two times a day (BID) | ORAL | Status: DC
  Administered 2023-10-21 – 2023-10-24 (×6): 100 mg via ORAL
  Filled 2023-10-21 (×6): qty 1

## 2023-10-21 MED ORDER — BACLOFEN 10 MG PO TABS
10.0000 mg | ORAL_TABLET | Freq: Three times a day (TID) | ORAL | Status: DC | PRN
  Administered 2023-10-22 – 2023-10-24 (×3): 10 mg via ORAL
  Filled 2023-10-21 (×4): qty 1

## 2023-10-21 MED ORDER — ROSUVASTATIN CALCIUM 5 MG PO TABS
10.0000 mg | ORAL_TABLET | Freq: Every day | ORAL | Status: DC
  Administered 2023-10-22 – 2023-10-24 (×3): 10 mg via ORAL
  Filled 2023-10-21 (×3): qty 2

## 2023-10-21 MED ORDER — SODIUM CHLORIDE 0.9% FLUSH
3.0000 mL | Freq: Two times a day (BID) | INTRAVENOUS | Status: DC
  Administered 2023-10-21 – 2023-10-24 (×6): 3 mL via INTRAVENOUS

## 2023-10-21 MED ORDER — SODIUM CHLORIDE 0.9 % IV SOLN
100.0000 mg | Freq: Once | INTRAVENOUS | Status: AC
  Administered 2023-10-21: 100 mg via INTRAVENOUS
  Filled 2023-10-21: qty 100

## 2023-10-21 MED ORDER — GABAPENTIN 300 MG PO CAPS: 300.0000 mg | ORAL_CAPSULE | ORAL | Status: DC

## 2023-10-21 MED ORDER — CARBIDOPA-LEVODOPA 25-100 MG PO TABS
1.0000 | ORAL_TABLET | Freq: Two times a day (BID) | ORAL | Status: DC
  Administered 2023-10-21 – 2023-10-24 (×7): 1 via ORAL
  Filled 2023-10-21 (×7): qty 1

## 2023-10-21 MED ORDER — GABAPENTIN 300 MG PO CAPS
600.0000 mg | ORAL_CAPSULE | Freq: Every evening | ORAL | Status: DC
Start: 2023-10-21 — End: 2023-10-24
  Administered 2023-10-22 – 2023-10-23 (×2): 600 mg via ORAL
  Filled 2023-10-21 (×3): qty 2

## 2023-10-21 MED ORDER — PREDNISONE 10 MG PO TABS
5.0000 mg | ORAL_TABLET | Freq: Every day | ORAL | Status: DC
  Administered 2023-10-22 – 2023-10-24 (×3): 5 mg via ORAL
  Filled 2023-10-21 (×3): qty 1

## 2023-10-21 MED ORDER — OXYBUTYNIN CHLORIDE 5 MG PO TABS
5.0000 mg | ORAL_TABLET | Freq: Two times a day (BID) | ORAL | Status: DC
  Administered 2023-10-21 – 2023-10-24 (×6): 5 mg via ORAL
  Filled 2023-10-21 (×6): qty 1

## 2023-10-21 MED ORDER — SODIUM CHLORIDE 0.9 % IV SOLN
1.0000 g | INTRAVENOUS | Status: DC
  Administered 2023-10-22 – 2023-10-23 (×2): 1 g via INTRAVENOUS
  Filled 2023-10-21 (×2): qty 10

## 2023-10-21 MED ORDER — ACETAMINOPHEN 325 MG PO TABS: 650.0000 mg | ORAL_TABLET | Freq: Four times a day (QID) | ORAL | Status: DC | PRN

## 2023-10-21 MED ORDER — CEFTRIAXONE SODIUM 1 G IJ SOLR
1.0000 g | Freq: Once | INTRAMUSCULAR | Status: AC
  Administered 2023-10-21: 1 g via INTRAVENOUS
  Filled 2023-10-21: qty 10

## 2023-10-21 MED ORDER — GABAPENTIN 300 MG PO CAPS
300.0000 mg | ORAL_CAPSULE | Freq: Every morning | ORAL | Status: DC
  Administered 2023-10-22 – 2023-10-24 (×3): 300 mg via ORAL
  Filled 2023-10-21 (×3): qty 1

## 2023-10-21 MED ORDER — PANTOPRAZOLE SODIUM 40 MG PO TBEC
40.0000 mg | DELAYED_RELEASE_TABLET | Freq: Every day | ORAL | Status: DC
  Administered 2023-10-22 – 2023-10-24 (×3): 40 mg via ORAL
  Filled 2023-10-21 (×3): qty 1

## 2023-10-21 MED ORDER — ENOXAPARIN SODIUM 40 MG/0.4ML IJ SOSY
40.0000 mg | PREFILLED_SYRINGE | INTRAMUSCULAR | Status: DC
  Administered 2023-10-21 – 2023-10-23 (×3): 40 mg via SUBCUTANEOUS
  Filled 2023-10-21 (×3): qty 0.4

## 2023-10-21 MED ORDER — DONEPEZIL HCL 10 MG PO TABS
10.0000 mg | ORAL_TABLET | Freq: Every day | ORAL | Status: DC
  Administered 2023-10-22 – 2023-10-24 (×3): 10 mg via ORAL
  Filled 2023-10-21 (×3): qty 1

## 2023-10-21 MED ORDER — LAMOTRIGINE 100 MG PO TABS
150.0000 mg | ORAL_TABLET | Freq: Two times a day (BID) | ORAL | Status: DC
  Administered 2023-10-21 – 2023-10-24 (×6): 150 mg via ORAL
  Filled 2023-10-21: qty 2
  Filled 2023-10-21: qty 6
  Filled 2023-10-21 (×5): qty 2

## 2023-10-21 NOTE — ED Triage Notes (Addendum)
 Pt BIB EMS from home, friend staying with pt d/t recent d/c after knee replacement. EMS reports pt had witnessed Fall last night. 2nd fall around 0530am, pt fell to knees and lowered to ground, hit face on walker resulting in cut lip with dry blood present.   The sister reported "abnormal behavior" after initial fall, reports of "Head bobbing and arms drooping" and expressive aphasia  No thinners.    LKW 1830 at dinner, Symptoms began shortly after 22g iv  EMS VS Cbg 169, 82% RA, 100% 6L [place], 103/86, HR 60 NSR

## 2023-10-21 NOTE — Progress Notes (Signed)
     Subjective: Valerie [name] is 2 days status post right total knee arthroplasty by Dr. Dion Saucier.  Per family member she became confused and drowsy at home resulting in fall.  EMS was called and she was found to be hypoxic she has since been weaned to room air.  Does have history significant for UTIs in the past.  Currently being treated with antibiotics.  Patient currently not answering questions or following commands with family member at bedside to provide history.  Objective:   VITALS:   Vitals:   10/21/23 1445 10/21/23 1500 10/21/23 1515 10/21/23 1545  BP: 112/62 (!) 105/58 106/61 101/76  Pulse: 64 65 (!) 59 64  Resp: 18 (!) 24 17 19   Temp:      TempSrc:      SpO2: 94% 95% 95% 93%   Right knee incision clean, dry, and intact, expected postoperative swelling, patient not cooperating with motor or sensory exam.  Foot is warm and well-perfused.  Knee feels stable to varus valgus stress  Lab Results  Component Value Date   WBC 9.3 10/21/2023   HGB 13.4 10/21/2023   HCT 40.2 10/21/2023   MCV 96.9 10/21/2023   PLT 106 (L) 10/21/2023   BMET    Component Value Date/Time   NA 140 10/21/2023 0723   K 4.2 10/21/2023 0723   CL 102 10/21/2023 0723   CO2 24 10/21/2023 0723   GLUCOSE 134 (H) 10/21/2023 0723   BUN 21 10/21/2023 0723   CREATININE 1.40 (H) 10/21/2023 0723   CREATININE 1.73 (H) 02/08/2018 1020   CALCIUM 8.9 10/21/2023 0723   CALCIUM 12.0 (H) 08/09/2020 1729   GFRNONAA 42 (L) 10/21/2023 0723   GFRNONAA 32 (L) 02/08/2018 1020    Assessment/Plan:     Principal Problem:   Acute encephalopathy Active Problems:   Anxiety   Morbid obesity (HCC)   CKD (chronic kidney disease), stage III (HCC)   Psoriatic arthritis (HCC)   OSA (obstructive sleep apnea)   Neuropathy   Bipolar disorder (HCC)   DM type 2 (diabetes mellitus, type 2) (HCC)   CAD (coronary artery disease)   Essential hypertension   Acute respiratory failure with hypoxia (HCC)   GERD (gastroesophageal  reflux disease)   Hyperlipidemia associated with type 2 diabetes mellitus (HCC)   Fall at home, initial encounter   Seizure disorder (HCC)   Parkinson disease (HCC)   UTI (urinary tract infection)  Patient is 2 days status post right arthroplasty by Dr. Dion Saucier with acute encephalopathy as well as UTI  Will order a right knee x-ray and follow-up given the patient's fall at home.  Otherwise patient can be weightbearing as tolerated and work with physical therapy when able.  Keep dressing clean, dry, and intact.  Resume DVT prophylaxis when appropriate.    Valerie [name] A Valerie [name] 10/21/2023, 4:32 PM   Valerie Cooks, MD  Contact information:   [phone] 7am-5pm epic message Dr. Blanchie Dessert, or call office for patient follow up: [phone] After hours and holidays please check [email or web address] for group call information for [facility]

## 2023-10-21 NOTE — H&P (Signed)
 History and Physical   Valerie [name] ZOX:[id number] DOB: 25-Apr-1961 DOA: 10/21/2023  PCP: Cristino Martes, NP   Patient coming from: Home  Chief Complaint: Confusion, fall  HPI: Valerie [name] is a 63 y.o. female with medical history significant of hypertension, hyperlipidemia, GERD, diabetes, CKD 3B, CAD, urinary retention, subclavian vein occlusion, dementia, Parkinson's disease, anxiety, bipolar's disorder, obesity, neuropathy, OSA, psoriatic arthritis, seizure disorder presenting after fall at home and development of confusion.  History obtained with assistance of patient's sister who lives with her given patient's altered mentation.  Patient underwent a right total knee arthroplasty 2 days ago on 2/25.  She had abrupt change in status yesterday and had a fall around 11 PM where she did not his her head. She had a second fall where she hit her mouth on her walker but again did not hit her head. She has significant confusion since yesterday as well has been slow to respond to questions and not responding appropriately.  Sister reports this is very similar to how she has presented in the past with UTI, she states she feels like it all started around the time or after the time of the fall.  EMS was called and patient initially was noted to be hypoxic on room air in the 80s.  Placed on 6 L by EMS and weaned to 2 L in the ED.  Unable to full/accurate ROS given patient's encephalopathy.  ED Course: Vital signs in the ED notable for blood pressure in the 100s 140 systolic, heart rate in the 50s to 60s, respiratory rate in the teens to 20s.  Left requiring 2 to 4 L to maintain saturations.  Lab workup included CMP with creatinine stable at 1.4, glucose 134, protein 6.4, albumin 3.4.  CBC showed platelets stable at 106.  Troponin negative x 2.  Rester panel for flu COVID RSV negative.  Ammonia level pending.  Urinalysis with nitrates, leukocytes, bacteria.  Urine culture pending.  Chest x-ray showed low  lung volumes no acute normality.  CT head showed persistent prominent optic nerves?  Underlying edema, recommendation for outpatient ophthalmology follow-up, partially empty sella(present on previous), no acute abnormality.  CT C-spine showed no acute normality.  CT chest showed trace right pleural effusion, mild mosaic attenuation pattern possibly representing a degree of small airway disease, subacute right 10th rib fracture.  Patient received ceftriaxone/doxycycline in the ED.  Review of Systems: As per HPI otherwise all other systems reviewed and are negative.  Past Medical History:  Diagnosis Date   Anemia    Anxiety    Arthritis    Psoriatic   Bipolar disorder (HCC)    CKD (chronic kidney disease), stage III (HCC)    Colovesical fistula    Dementia (HCC)    Depression    DM type 2 (diabetes mellitus, type 2) (HCC) 11/14/2019   Dyspnea    E coli bacteremia 07/28/2023   Headache    Hyperthyroidism    Neuromuscular disorder (HCC)    Neuropathy    NSTEMI (non-ST elevated myocardial infarction) (HCC) 11/14/2019   OSA (obstructive sleep apnea)    Primary localized osteoarthritis of right knee 10/19/2023   Psoriatic arthritis (HCC)    Pyelonephritis 07/23/2023   Sleep apnea    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   Stroke (HCC)    ?history; MRI normal    Past Surgical History:  Procedure Laterality Date   ABDOMINAL HYSTERECTOMY     APPENDECTOMY     CHOLECYSTECTOMY     CYSTOSCOPY  WITH STENT PLACEMENT N/A 07/17/2020   Procedure: CYSTOSCOPY WITH BILATERAL FIREFLY INJECTION;  Surgeon: Heloise Purpura, MD;  Location: [facility];  Service: Urology;  Laterality: N/A;   FLEXIBLE SIGMOIDOSCOPY N/A 05/17/2020   Procedure: FLEXIBLE SIGMOIDOSCOPY;  Surgeon: Jeani Hawking, MD;  Location: [facility];  Service: Endoscopy;  Laterality: N/A;   FOOT SURGERY Right    IR FLUORO GUIDE CV LINE RIGHT  08/19/2020   IR RADIOLOGIST EVAL & MGMT  09/10/2020   IR RADIOLOGIST EVAL & MGMT  10/10/2020    IR REMOVAL TUN CV CATH W/O FL  09/16/2020   IR SINUS/FIST TUBE CHK-NON GI  10/24/2020   IR SINUS/FIST TUBE CHK-NON GI  10/31/2020   IR US GUIDE VASC ACCESS RIGHT  08/19/2020   LEFT HEART CATH AND CORONARY ANGIOGRAPHY N/A 11/14/2019   Procedure: LEFT HEART CATH AND CORONARY ANGIOGRAPHY;  Surgeon: [place], Peter M, MD;  Location: [facility] INVASIVE CV LAB;  Service: Cardiovascular;  Laterality: N/A;   PARATHYROID EXPLORATION N/A 01/09/2021   Procedure: POSSIBLE NECK EXPLORATION;  Surgeon: Darnell Level, MD;  Location: [facility];  Service: General;  Laterality: N/A;   PARATHYROIDECTOMY Left 01/09/2021   Procedure: LEFT INFERIOR PARATHYROIDECTOMY;  Surgeon: Darnell Level, MD;  Location: [facility];  Service: General;  Laterality: Left;   PROCTOSCOPY N/A 07/17/2020   Procedure: RIGID PROCTOSCOPY;  Surgeon: Karie Soda, MD;  Location: [facility];  Service: General;  Laterality: N/A;   TONSILLECTOMY     TOTAL HIP ARTHROPLASTY Bilateral    TOTAL KNEE ARTHROPLASTY Right 10/19/2023   Procedure: TOTAL KNEE ARTHROPLASTY;  Surgeon: Teryl Lucy, MD;  Location: [facility];  Service: Orthopedics;  Laterality: Right;    Social History  reports that she has quit smoking. Her smoking use included cigarettes. She has been exposed to tobacco smoke. She has never used smokeless tobacco. She reports current alcohol use. She reports that she does not use drugs.  Allergies  Allergen Reactions   Carbamazepine Other (See Comments)    Parkinsons like symptoms tremors   Sertraline Hcl Other (See Comments)    Parkinsons like symptoms    Family History  Problem Relation Age of Onset   Breast cancer Neg Hx   Reviewed on admission  Prior to Admission medications   Medication Sig Start Date End Date Taking? Authorizing Provider  ALPRAZolam Prudy Feeler) 0.5 MG tablet Take 0.5 tablets (0.25 mg total) by mouth 3 (three) times daily as needed for anxiety. Patient taking differently: Take 0.5 mg by mouth 3 (three) times daily as needed for  anxiety. 07/28/23   Narda Bonds, MD  aspirin EC 325 MG tablet Take 1 tablet (325 mg total) by mouth 2 (two) times daily. 10/19/23   Teryl Lucy, MD  baclofen (LIORESAL) 10 MG tablet Take 1 tablet (10 mg total) by mouth 3 (three) times daily. As needed for muscle spasm 10/19/23   Teryl Lucy, MD  betamethasone valerate ointment (VALISONE) 0.1 % Apply 1 Application topically 2 (two) times daily. Patient taking differently: Apply 1 Application topically 2 (two) times daily as needed (psoriasis). 06/14/23   Myrlene Broker, MD  carbidopa-levodopa (SINEMET IR) 25-100 MG tablet Take 1 tablet by mouth in the morning and at bedtime. 08/27/22   [provider]  docusate sodium (COLACE) 100 MG capsule Take 200 mg by mouth at bedtime.    [provider]  donepezil (ARICEPT) 10 MG tablet Take 10 mg by mouth daily. 10/30/19   [provider]  fluconazole (DIFLUCAN) 150 MG tablet Take 150  mg by mouth every 3 (three) days. 07/29/23   [provider]  FLUoxetine (PROZAC) 40 MG capsule Take 1 capsule (40 mg total) by mouth daily. Patient taking differently: Take 80 mg by mouth daily. 07/29/23   Narda Bonds, MD  gabapentin (NEURONTIN) 300 MG capsule Take 300-600 mg by mouth See admin instructions. Take 300 mg in the morning and 600 mg at night 07/29/23   [provider]  lamoTRIgine (LAMICTAL) 150 MG tablet Take 1 tablet by mouth 2 (two) times daily. 12/28/22   [provider]  Lecanemab-irmb (LEQEMBI IV) Inject into the vein.    [provider]  levETIRAcetam (KEPPRA) 750 MG tablet Take 750 mg by mouth 2 (two) times daily.    [provider]  levocetirizine (XYZAL) 5 MG tablet Take 1 tablet (5 mg total) by mouth every evening. 08/11/23   Myrlene Broker, MD  meclizine (ANTIVERT) 12.5 MG tablet Take 12.5 mg by mouth 3 (three) times daily as needed for dizziness.    [provider]  memantine (NAMENDA) 10 MG tablet Take 10  mg by mouth 2 (two) times daily. 03/18/22   [provider]  Multiple Vitamins-Minerals (MULTIVITAMIN WITH MINERALS) tablet Take 1 tablet by mouth at bedtime.    [provider]  nitroGLYCERIN (NITROSTAT) 0.4 MG SL tablet Place 1 tablet (0.4 mg total) under the tongue every 5 (five) minutes as needed for chest pain. 05/03/23   Gwenlyn Fudge, FNP  Omega-3 Fatty Acids (FISH OIL PO) Take 480 mg by mouth daily.    [provider]  omeprazole (PRILOSEC) 40 MG capsule Take 1 capsule (40 mg total) by mouth daily. 08/11/23   Myrlene Broker, MD  ondansetron (ZOFRAN) 8 MG tablet Take 1 tablet (8 mg total) by mouth every 8 (eight) hours as needed for nausea or vomiting. 10/19/23   Teryl Lucy, MD  oxybutynin (DITROPAN) 5 MG tablet Take 5 mg by mouth 2 (two) times daily. 09/13/23   [provider]  polyethylene glycol (MIRALAX / GLYCOLAX) 17 g packet Take 17 g by mouth 2 (two) times daily. Patient taking differently: Take 17 g by mouth daily as needed for moderate constipation. 07/28/23   Narda Bonds, MD  predniSONE (DELTASONE) 5 MG tablet TAKE 1 TABLET(5 MG) BY MOUTH DAILY WITH BREAKFAST 10/07/23   Myrlene Broker, MD  primidone (MYSOLINE) 50 MG tablet Take 100 mg by mouth 2 (two) times daily. 04/09/22   [provider]  rizatriptan (MAXALT) 10 MG tablet Take 10 mg by mouth as needed for migraine. May repeat in 2 hours if needed    [provider]  rosuvastatin (CRESTOR) 10 MG tablet Take 1 tablet (10 mg total) by mouth daily. 12/04/22   Myrlene Broker, MD  sennosides-docusate sodium (SENOKOT-S) 8.6-50 MG tablet Take 2 tablets by mouth daily. 10/19/23   Teryl Lucy, MD  traZODone (DESYREL) 100 MG tablet Take 2 tablets (200 mg total) by mouth at bedtime. 07/23/20   Sherrie George, PA-C    Physical Exam: Vitals:   10/21/23 0930 10/21/23 0945 10/21/23 1015 10/21/23 1045  BP: 129/74 118/66 111/78 111/66  Pulse: 62 60 62 62  Resp:  15 16 16 16   Temp:      TempSrc:      SpO2: 90% 91% 91% 94%    Physical Exam Constitutional:      General: She is not in acute distress.    Appearance: Normal appearance.  HENT:  Head: Normocephalic and atraumatic.     Mouth/Throat:     Mouth: Mucous membranes are moist.     Pharynx: Oropharynx is clear.  Eyes:     Extraocular Movements: Extraocular movements intact.     Pupils: Pupils are equal, round, and reactive to light.  Cardiovascular:     Rate and Rhythm: Normal rate and regular rhythm.     Pulses: Normal pulses.     Heart sounds: Normal heart sounds.  Pulmonary:     Effort: Pulmonary effort is normal. No respiratory distress.     Breath sounds: Normal breath sounds.  Abdominal:     General: Bowel sounds are normal. There is no distension.     Palpations: Abdomen is soft.     Tenderness: There is no abdominal tenderness.  Musculoskeletal:        General: No swelling or deformity.  Skin:    General: Skin is warm and dry.  Neurological:     Comments: Drowsy, arousable to voice.  Will answer questions.  Mostly answers inappropriately.    Labs on Admission: I have personally reviewed following labs and imaging studies  CBC: Recent Labs  Lab 10/21/23 0723  WBC 9.3  NEUTROABS 7.2  HGB 13.4  HCT 40.2  MCV 96.9  PLT 106*    Basic Metabolic Panel: Recent Labs  Lab 10/21/23 0723  NA 140  K 4.2  CL 102  CO2 24  GLUCOSE 134*  BUN 21  CREATININE 1.40*  CALCIUM 8.9    GFR: Estimated Creatinine Clearance: 42.5 mL/min (A) (by C-G formula based on SCr of 1.4 mg/dL (H)).  Liver Function Tests: Recent Labs  Lab 10/21/23 0723  AST 20  ALT 6  ALKPHOS 72  BILITOT 0.5  PROT 6.4*  ALBUMIN 3.4*    Urine analysis:    Component Value Date/Time   COLORURINE YELLOW 10/21/2023 1130   APPEARANCEUR CLEAR 10/21/2023 1130   LABSPEC 1.008 10/21/2023 1130   PHURINE 5.0 10/21/2023 1130   GLUCOSEU NEGATIVE 10/21/2023 1130   HGBUR NEGATIVE 10/21/2023 1130    BILIRUBINUR NEGATIVE 10/21/2023 1130   KETONESUR NEGATIVE 10/21/2023 1130   PROTEINUR NEGATIVE 10/21/2023 1130   NITRITE POSITIVE (A) 10/21/2023 1130   LEUKOCYTESUR SMALL (A) 10/21/2023 1130    Radiological Exams on Admission: DG Chest Portable 1 View Result Date: 10/21/2023 CLINICAL DATA:  63 year old female with hypoxia. EXAM: PORTABLE CHEST 1 VIEW COMPARISON:  CT Chest, Abdomen, and Pelvis 09/21/2022 and earlier. FINDINGS: Portable AP semi upright view at 0754 hours. Lower lung volumes compared to last year. Stable cardiac size and mediastinal contours. Visualized tracheal air column is within normal limits. Allowing for portable technique the lungs are clear. No pneumothorax or pleural effusion. Stable cholecystectomy clips. Negative visible bowel gas. No acute osseous abnormality identified. IMPRESSION: Low lung volumes, otherwise no acute cardiopulmonary abnormality. Electronically Signed   By: Odessa Fleming M.D.   On: 10/21/2023 11:32   CT Chest Wo Contrast Result Date: 10/21/2023 CLINICAL DATA:  Hypoxia, concern for aspiration EXAM: CT CHEST WITHOUT CONTRAST TECHNIQUE: Multidetector CT imaging of the chest was performed following the standard protocol without IV contrast. RADIATION DOSE REDUCTION: This exam was performed according to the departmental dose-optimization program which includes automated exposure control, adjustment of the mA and/or kV according to patient size and/or use of iterative reconstruction technique. COMPARISON:  07/23/2023 FINDINGS: Cardiovascular: Heart size is upper limits of normal. No pericardial effusion. Thoracic aorta is not aneurysmal. Scattered atherosclerotic vascular calcifications of the aorta and coronary  arteries. Central pulmonary vasculature within normal limits. Mediastinum/Nodes: No enlarged mediastinal or axillary lymph nodes. Thyroid gland, trachea, and esophagus demonstrate no significant findings. Tiny hiatal hernia. Lungs/Pleura: Trace pleural fluid  present on the right. Mild mosaic attenuation. Areas of chronic linear scarring bilaterally. Otherwise, no focal airspace consolidation. No pneumothorax. Upper Abdomen: No acute abnormality. Musculoskeletal: Subacute nondisplaced fracture of the posterior right tenth rib. Chronic fracture of the posterior right fourth rib. Chronic mild superior endplate compression deformities of T9 and T11. No acute osseous abnormality. No significant chest wall abnormality. IMPRESSION: 1. Trace right pleural effusion. 2. Mild mosaic attenuation of the lungs, which can be seen in the setting of small airways disease. 3. Subacute nondisplaced fracture of the posterior right tenth rib. 4. Aortic and coronary artery atherosclerosis (ICD10-I70.0). Electronically Signed   By: Duanne Guess D.O.   On: 10/21/2023 11:01   CT Head Wo Contrast Result Date: 10/21/2023 CLINICAL DATA:  Mental status change, unknown cause head trauma, mental status change; Polytrauma, blunt EXAM: CT HEAD WITHOUT CONTRAST CT CERVICAL SPINE WITHOUT CONTRAST TECHNIQUE: Multidetector CT imaging of the head and cervical spine was performed following the standard protocol without intravenous contrast. Multiplanar CT image reconstructions of the cervical spine were also generated. RADIATION DOSE REDUCTION: This exam was performed according to the departmental dose-optimization program which includes automated exposure control, adjustment of the mA and/or kV according to patient size and/or use of iterative reconstruction technique. COMPARISON:  MRI head 07/27/2023, CT head 07/23/2023 FINDINGS: CT HEAD FINDINGS Brain: No evidence of large-territorial acute infarction. No parenchymal hemorrhage. No mass lesion. No extra-axial collection. No mass effect or midline shift. No hydrocephalus. Basilar cisterns are patent. Empty sella. Vascular: No hyperdense vessel. Skull: No acute fracture or focal lesion. Sinuses/Orbits: Paranasal sinuses and mastoid air cells are  clear. Persistent prominent bilateral optic nerves with query underlying edema. The orbits are unremarkable. Other: None. CT CERVICAL SPINE FINDINGS Alignment: Reversal of the normal cervical lordosis centered at the C5 level likely due to positioning and degenerative changes. Skull base and vertebrae: Multilevel moderate degenerative changes of the spine most prominent at the C5-C6 level. No acute fracture. No aggressive appearing focal osseous lesion or focal pathologic process. Soft tissues and spinal canal: No prevertebral fluid or swelling. No visible canal hematoma. Upper chest: Unremarkable. Other: None. IMPRESSION: 1. Persistent prominent bilateral optic nerves with query underlying edema. Recommend outpatient ophthalmologic follow-up. 2. Otherwise no acute intracranial abnormality. 3. No acute displaced fracture or traumatic listhesis of the cervical spine. 4. Empty sella. Findings is often a normal anatomic variant but can be associated with idiopathic intracranial hypertension (pseudotumor cerebri). Electronically Signed   By: Tish Frederickson M.D.   On: 10/21/2023 08:25   CT Cervical Spine Wo Contrast Result Date: 10/21/2023 CLINICAL DATA:  Mental status change, unknown cause head trauma, mental status change; Polytrauma, blunt EXAM: CT HEAD WITHOUT CONTRAST CT CERVICAL SPINE WITHOUT CONTRAST TECHNIQUE: Multidetector CT imaging of the head and cervical spine was performed following the standard protocol without intravenous contrast. Multiplanar CT image reconstructions of the cervical spine were also generated. RADIATION DOSE REDUCTION: This exam was performed according to the departmental dose-optimization program which includes automated exposure control, adjustment of the mA and/or kV according to patient size and/or use of iterative reconstruction technique. COMPARISON:  MRI head 07/27/2023, CT head 07/23/2023 FINDINGS: CT HEAD FINDINGS Brain: No evidence of large-territorial acute infarction. No  parenchymal hemorrhage. No mass lesion. No extra-axial collection. No mass effect or midline shift. No hydrocephalus.  Basilar cisterns are patent. Empty sella. Vascular: No hyperdense vessel. Skull: No acute fracture or focal lesion. Sinuses/Orbits: Paranasal sinuses and mastoid air cells are clear. Persistent prominent bilateral optic nerves with query underlying edema. The orbits are unremarkable. Other: None. CT CERVICAL SPINE FINDINGS Alignment: Reversal of the normal cervical lordosis centered at the C5 level likely due to positioning and degenerative changes. Skull base and vertebrae: Multilevel moderate degenerative changes of the spine most prominent at the C5-C6 level. No acute fracture. No aggressive appearing focal osseous lesion or focal pathologic process. Soft tissues and spinal canal: No prevertebral fluid or swelling. No visible canal hematoma. Upper chest: Unremarkable. Other: None. IMPRESSION: 1. Persistent prominent bilateral optic nerves with query underlying edema. Recommend outpatient ophthalmologic follow-up. 2. Otherwise no acute intracranial abnormality. 3. No acute displaced fracture or traumatic listhesis of the cervical spine. 4. Empty sella. Findings is often a normal anatomic variant but can be associated with idiopathic intracranial hypertension (pseudotumor cerebri). Electronically Signed   By: Tish Frederickson M.D.   On: 10/21/2023 08:25   EKG: Independently reviewed.  Sinus rhythm at 65 bpm.  Low voltage multiple leads.  Nonspecific T wave changes.  Assessment/Plan Active Problems:   Anxiety   Morbid obesity (HCC)   CKD (chronic kidney disease), stage III (HCC)   Psoriatic arthritis (HCC)   OSA (obstructive sleep apnea)   Neuropathy   Bipolar disorder (HCC)   DM type 2 (diabetes mellitus, type 2) (HCC)   CAD (coronary artery disease)   Essential hypertension   Acute respiratory failure with hypoxia (HCC)   GERD (gastroesophageal reflux disease)   Hyperlipidemia  associated with type 2 diabetes mellitus (HCC)   Fall at home, initial encounter   Seizure disorder (HCC)   Parkinson disease (HCC)   UTI (urinary tract infection)   Acute encephalopathy Fall UTI > Patient presenting after confusion for 1 day with slow response and responding appropriately.  Also had a fall yesterday. > Patient's sister who lives with her reports that she is acting like she has in the past when she has had UTIs.  On pain medicine and muscle relaxant for her recent knee surgery, possible this is contributing.  Sister states this is not how her seizures have presented in the past and she does not miss her seizure medication doses. > Recently underwent right total knee arthroplasty. > CT head with no acute normality but did show some persistent optic nerve prominence and persistent empty sella > Urinalysis with nitrates, leukocytes, bacteria.  No leukocytosis.  Urine culture pending. - Monitor on telemetry overnight - Continue with ceftriaxone - Follow-up urine culture - Trend fever curve and WBC - Supportive care  Hypoxia > Patient initially requiring 6 L for EMS and has been weaned to 2 L in the ED. > Has subacute rib fracture on imaging as well as possible small airway disease. > No leukocytosis and no evidence of pneumonia on chest x-ray.  Or CT chest.  Negative flu COVID RSV.  Will check full respiratory viral panel for possible etiology. - Continue supplemental oxygen, wean as tolerated, currently on a trial of room air and tolerating. - Check full respiratory viral panel.  Status post right knee total arthroplasty > Performed 2 days ago. - Continue PRN Baclofen and pain medication - Continue Prednisone  Hypertension - Not currently on any antihypertensives  Hyperlipidemia - Continue rosuvastatin  GERD - Continue PPI  Diabetes - SSI  CKD 3B > Creatinine currently stable 1.4. - Trend renal function  and electrolytes  CAD - Continue home ASA,  rosuvastatin  Urinary retention - Continue home oxybutynin   Parkinson's Disease - Continue home carbidopa-levodopa  Dementia - Continue home donepezil, Namenda - Is on Leqembi outpatient  Anxiety Bipolar disorder - Continue home Xanax, fluoxetine, trazodone  Obesity - Noted  OSA - Continue home CPAP  Neuropathy - Continue gabapentin  Seizure disorder - Continue home Lamictal, Keppra, primidone - Is also on gabapentin as above  Psoriatic arthritis - Noted  Abnormal CT Head > Persistent prominent optic nerve.  Recommendation is for ophthalmology follow-up per the read.  DVT prophylaxis: Lovenox Code Status:   Full Family Communication:  Updated at bedside Disposition Plan:   Patient is from:  Home  Anticipated DC to:  Home  Anticipated DC date:  1 to 3 days  Anticipated DC barriers: None  Consults called:  None Admission status:  Observation, telemetry  Severity of Illness: The appropriate patient status for this patient is OBSERVATION. Observation status is judged to be reasonable and necessary in order to provide the required intensity of service to ensure the patient's safety. The patient's presenting symptoms, physical exam findings, and initial radiographic and laboratory data in the context of their medical condition is felt to place them at decreased risk for further clinical deterioration. Furthermore, it is anticipated that the patient will be medically stable for discharge from the hospital within 2 midnights of admission.    Synetta Fail MD [facility]  How to contact the [facility] Attending or Consulting provider 7A - 7P or covering provider during after hours 7P -7A, for this patient?   Check the care team in [facility] and look for a) attending/consulting [facility] provider listed and b) the [facility] team listed Log into [email or web address] and use [place]'s universal password to access. If you do not have the password, please contact the hospital  operator. Locate the [facility] provider you are looking for under [facility] and page to a number that you can be directly reached. If you still have difficulty reaching the provider, please page the [facility] (Director on Call) for the Hospitalists listed on amion for assistance.  10/21/2023, 1:29 PM

## 2023-10-21 NOTE — ED Provider Notes (Signed)
 [place] EMERGENCY DEPARTMENT AT [facility] Provider Note   CSN: [id number] Arrival date & time: 10/21/23  6962     History  Chief Complaint  Patient presents with   Marletta Lor    Valerie [name] is a 62 y.o. female with hx of TIA, seizure disorder, bipolar disorder, Parkinson's disease, chronic urinary retention, CKD, recently diagnosed dementia, presenting from home with a fall yesterday evening and confusion.  Patient sisters present at the bedside, they live together, she provides about a history.  The patient underwent right total knee arthroplasty on 10/19/23, two days ago, with Dr Teryl Lucy.  Her sister reports the patient had a fall yesterday evening around 11 PM.  She may have struck her head.  She has been having more confused than normal since then.  She is very slow to respond and not responding appropriately.  Patient is able to tell me she does not have a headache.  She was also noted to be hypoxic to 82% by EMS requiring initially 6 L nasal cannula weaned down now to 2.  She does not wear oxygen at home.  HPI     Home Medications Prior to Admission medications   Medication Sig Start Date End Date Taking? Authorizing Provider  ALPRAZolam Prudy Feeler) 0.5 MG tablet Take 0.5 tablets (0.25 mg total) by mouth 3 (three) times daily as needed for anxiety. Patient taking differently: Take 0.5 mg by mouth 3 (three) times daily as needed for anxiety. 07/28/23  Yes Narda Bonds, MD  aspirin EC 81 MG tablet Take 81 mg by mouth daily. Swallow whole.   Yes [provider]  baclofen (LIORESAL) 10 MG tablet Take 1 tablet (10 mg total) by mouth 3 (three) times daily. As needed for muscle spasm 10/19/23  Yes Teryl Lucy, MD  carbidopa-levodopa (SINEMET IR) 25-100 MG tablet Take 1 tablet by mouth in the morning and at bedtime. 08/27/22  Yes [provider]  Docusate Calcium (STOOL SOFTENER PO) Take 1 tablet by mouth in the morning and at bedtime.   Yes [provider]  donepezil (ARICEPT) 10 MG tablet Take 10 mg by mouth daily. 10/30/19  Yes [provider]  fluconazole (DIFLUCAN) 150 MG tablet Take 150 mg by mouth every 3 (three) days. 07/29/23  Yes [provider]  FLUoxetine (PROZAC) 40 MG capsule Take 1 capsule (40 mg total) by mouth daily. Patient taking differently: Take 80 mg by mouth daily. 07/29/23  Yes Narda Bonds, MD  gabapentin (NEURONTIN) 300 MG capsule Take 300-600 mg by mouth See admin instructions. Take 300 mg in the morning and 600 mg at night 07/29/23  Yes [provider]  lamoTRIgine (LAMICTAL) 150 MG tablet Take 1 tablet by mouth 2 (two) times daily. 12/28/22  Yes [provider]  Lecanemab-irmb (LEQEMBI IV) Inject into the vein every 14 (fourteen) days.   Yes [provider]  levETIRAcetam (KEPPRA) 750 MG tablet Take 750 mg by mouth 2 (two) times daily.   Yes [provider]  levocetirizine (XYZAL) 5 MG tablet Take 1 tablet (5 mg total) by mouth every evening. 08/11/23  Yes Myrlene Broker, MD  meclizine (ANTIVERT) 25 MG tablet Take 25 mg by mouth 3 (three) times daily as needed for dizziness.   Yes [provider]  memantine (NAMENDA) 10 MG tablet Take 10 mg by mouth 2 (two) times daily. 03/18/22  Yes [provider]  Multiple Vitamins-Minerals (MULTIVITAMIN WITH MINERALS) tablet Take 1 tablet by mouth at  bedtime.   Yes [provider]  nitroGLYCERIN (NITROSTAT) 0.4 MG SL tablet Place 1 tablet (0.4 mg total) under the tongue every 5 (five) minutes as needed for chest pain. 05/03/23  Yes [name] F, FNP  Omega-3 Fatty Acids (FISH OIL PO) Take 2 capsules by mouth daily.   Yes [provider]  omeprazole (PRILOSEC) 40 MG capsule Take 1 capsule (40 mg total) by mouth daily. 08/11/23  Yes Myrlene Broker, MD  ondansetron (ZOFRAN) 8 MG tablet Take 1 tablet (8 mg total) by mouth every 8 (eight) hours as needed for nausea or vomiting.  10/19/23  Yes Teryl Lucy, MD  oxybutynin (DITROPAN) 5 MG tablet Take 5 mg by mouth 2 (two) times daily. 09/13/23  Yes [provider]  oxyCODONE (OXY IR/ROXICODONE) 5 MG immediate release tablet Take 5 mg by mouth every 4 (four) hours as needed for severe pain (pain score 7-10).   Yes [provider]  polyethylene glycol (MIRALAX / GLYCOLAX) 17 g packet Take 17 g by mouth 2 (two) times daily. Patient taking differently: Take 17 g by mouth daily as needed for moderate constipation. 07/28/23  Yes Narda Bonds, MD  predniSONE (DELTASONE) 5 MG tablet TAKE 1 TABLET(5 MG) BY MOUTH DAILY WITH BREAKFAST 10/07/23  Yes Myrlene Broker, MD  primidone (MYSOLINE) 50 MG tablet Take 100 mg by mouth 2 (two) times daily. 04/09/22  Yes [provider]  rizatriptan (MAXALT) 10 MG tablet Take 10 mg by mouth as needed for migraine. May repeat in 2 hours if needed   Yes [provider]  rosuvastatin (CRESTOR) 10 MG tablet Take 1 tablet (10 mg total) by mouth daily. 12/04/22  Yes Myrlene Broker, MD  traZODone (DESYREL) 100 MG tablet Take 2 tablets (200 mg total) by mouth at bedtime. 07/23/20  Yes Sherrie George, PA-C  sennosides-docusate sodium (SENOKOT-S) 8.6-50 MG tablet Take 2 tablets by mouth daily. Patient not taking: Reported on 10/21/2023 10/19/23   Teryl Lucy, MD      Allergies    Carbamazepine and Sertraline hcl    Review of Systems   Review of Systems  Physical Exam Updated Vital Signs BP 112/62   Pulse 64   Temp 99.2 F (37.3 C) (Oral)   Resp 18   SpO2 94%  Physical Exam Constitutional:      General: She is not in acute distress. HENT:     Head: Normocephalic and atraumatic.  Eyes:     Conjunctiva/sclera: Conjunctivae normal.     Pupils: Pupils are equal, round, and reactive to light.  Cardiovascular:     Rate and Rhythm: Normal rate and regular rhythm.  Pulmonary:     Effort: Pulmonary effort is normal. No respiratory distress.   Abdominal:     General: There is no distension.     Tenderness: There is no abdominal tenderness.  Musculoskeletal:     Comments: Right knee and lower extremity swelling with incision site clean and intact  Skin:    General: Skin is warm and dry.  Neurological:     Mental Status: She is alert.     Comments: Poor effort on motor strength testing in bilateral upper and lower extremities, no obvious facial droop, speech is slow and mildly slurred, difficulty tracking finger with extraocular range of testing; pt incorrectly localizes stimuli     ED Results / Procedures / Treatments   Labs (all labs ordered are listed, but only abnormal results are displayed) Labs Reviewed  COMPREHENSIVE  METABOLIC PANEL - Abnormal; Notable for the following components:      Result Value   Glucose, Bld 134 (*)    Creatinine, Ser 1.40 (*)    Total Protein 6.4 (*)    Albumin 3.4 (*)    GFR, Estimated 42 (*)    All other components within normal limits  CBC WITH DIFFERENTIAL/PLATELET - Abnormal; Notable for the following components:   Platelets 106 (*)    All other components within normal limits  URINALYSIS, ROUTINE W REFLEX MICROSCOPIC - Abnormal; Notable for the following components:   Nitrite POSITIVE (*)    Leukocytes,Ua SMALL (*)    Bacteria, UA RARE (*)    All other components within normal limits  RESP PANEL BY RT-PCR (RSV, FLU A&B, COVID)  [id number]  URINE CULTURE  RESPIRATORY PANEL BY PCR  AMMONIA  HEMOGLOBIN A1C  TROPONIN I (HIGH SENSITIVITY)  TROPONIN I (HIGH SENSITIVITY)    EKG None  Radiology DG Chest Portable 1 View Result Date: 10/21/2023 CLINICAL DATA:  63 year old female with hypoxia. EXAM: PORTABLE CHEST 1 VIEW COMPARISON:  CT Chest, Abdomen, and Pelvis 09/21/2022 and earlier. FINDINGS: Portable AP semi upright view at 0754 hours. Lower lung volumes compared to last year. Stable cardiac size and mediastinal contours. Visualized tracheal air column is within normal limits.  Allowing for portable technique the lungs are clear. No pneumothorax or pleural effusion. Stable cholecystectomy clips. Negative visible bowel gas. No acute osseous abnormality identified. IMPRESSION: Low lung volumes, otherwise no acute cardiopulmonary abnormality. Electronically Signed   By: Odessa Fleming M.D.   On: 10/21/2023 11:32   CT Chest Wo Contrast Result Date: 10/21/2023 CLINICAL DATA:  Hypoxia, concern for aspiration EXAM: CT CHEST WITHOUT CONTRAST TECHNIQUE: Multidetector CT imaging of the chest was performed following the standard protocol without IV contrast. RADIATION DOSE REDUCTION: This exam was performed according to the departmental dose-optimization program which includes automated exposure control, adjustment of the mA and/or kV according to patient size and/or use of iterative reconstruction technique. COMPARISON:  07/23/2023 FINDINGS: Cardiovascular: Heart size is upper limits of normal. No pericardial effusion. Thoracic aorta is not aneurysmal. Scattered atherosclerotic vascular calcifications of the aorta and coronary arteries. Central pulmonary vasculature within normal limits. Mediastinum/Nodes: No enlarged mediastinal or axillary lymph nodes. Thyroid gland, trachea, and esophagus demonstrate no significant findings. Tiny hiatal hernia. Lungs/Pleura: Trace pleural fluid present on the right. Mild mosaic attenuation. Areas of chronic linear scarring bilaterally. Otherwise, no focal airspace consolidation. No pneumothorax. Upper Abdomen: No acute abnormality. Musculoskeletal: Subacute nondisplaced fracture of the posterior right tenth rib. Chronic fracture of the posterior right fourth rib. Chronic mild superior endplate compression deformities of T9 and T11. No acute osseous abnormality. No significant chest wall abnormality. IMPRESSION: 1. Trace right pleural effusion. 2. Mild mosaic attenuation of the lungs, which can be seen in the setting of small airways disease. 3. Subacute nondisplaced  fracture of the posterior right tenth rib. 4. Aortic and coronary artery atherosclerosis (ICD10-I70.0). Electronically Signed   By: Duanne Guess D.O.   On: 10/21/2023 11:01   CT Head Wo Contrast Result Date: 10/21/2023 CLINICAL DATA:  Mental status change, unknown cause head trauma, mental status change; Polytrauma, blunt EXAM: CT HEAD WITHOUT CONTRAST CT CERVICAL SPINE WITHOUT CONTRAST TECHNIQUE: Multidetector CT imaging of the head and cervical spine was performed following the standard protocol without intravenous contrast. Multiplanar CT image reconstructions of the cervical spine were also generated. RADIATION DOSE REDUCTION: This exam was performed according to the departmental dose-optimization program which  includes automated exposure control, adjustment of the mA and/or kV according to patient size and/or use of iterative reconstruction technique. COMPARISON:  MRI head 07/27/2023, CT head 07/23/2023 FINDINGS: CT HEAD FINDINGS Brain: No evidence of large-territorial acute infarction. No parenchymal hemorrhage. No mass lesion. No extra-axial collection. No mass effect or midline shift. No hydrocephalus. Basilar cisterns are patent. Empty sella. Vascular: No hyperdense vessel. Skull: No acute fracture or focal lesion. Sinuses/Orbits: Paranasal sinuses and mastoid air cells are clear. Persistent prominent bilateral optic nerves with query underlying edema. The orbits are unremarkable. Other: None. CT CERVICAL SPINE FINDINGS Alignment: Reversal of the normal cervical lordosis centered at the C5 level likely due to positioning and degenerative changes. Skull base and vertebrae: Multilevel moderate degenerative changes of the spine most prominent at the C5-C6 level. No acute fracture. No aggressive appearing focal osseous lesion or focal pathologic process. Soft tissues and spinal canal: No prevertebral fluid or swelling. No visible canal hematoma. Upper chest: Unremarkable. Other: None. IMPRESSION: 1.  Persistent prominent bilateral optic nerves with query underlying edema. Recommend outpatient ophthalmologic follow-up. 2. Otherwise no acute intracranial abnormality. 3. No acute displaced fracture or traumatic listhesis of the cervical spine. 4. Empty sella. Findings is often a normal anatomic variant but can be associated with idiopathic intracranial hypertension (pseudotumor cerebri). Electronically Signed   By: Tish Frederickson M.D.   On: 10/21/2023 08:25   CT Cervical Spine Wo Contrast Result Date: 10/21/2023 CLINICAL DATA:  Mental status change, unknown cause head trauma, mental status change; Polytrauma, blunt EXAM: CT HEAD WITHOUT CONTRAST CT CERVICAL SPINE WITHOUT CONTRAST TECHNIQUE: Multidetector CT imaging of the head and cervical spine was performed following the standard protocol without intravenous contrast. Multiplanar CT image reconstructions of the cervical spine were also generated. RADIATION DOSE REDUCTION: This exam was performed according to the departmental dose-optimization program which includes automated exposure control, adjustment of the mA and/or kV according to patient size and/or use of iterative reconstruction technique. COMPARISON:  MRI head 07/27/2023, CT head 07/23/2023 FINDINGS: CT HEAD FINDINGS Brain: No evidence of large-territorial acute infarction. No parenchymal hemorrhage. No mass lesion. No extra-axial collection. No mass effect or midline shift. No hydrocephalus. Basilar cisterns are patent. Empty sella. Vascular: No hyperdense vessel. Skull: No acute fracture or focal lesion. Sinuses/Orbits: Paranasal sinuses and mastoid air cells are clear. Persistent prominent bilateral optic nerves with query underlying edema. The orbits are unremarkable. Other: None. CT CERVICAL SPINE FINDINGS Alignment: Reversal of the normal cervical lordosis centered at the C5 level likely due to positioning and degenerative changes. Skull base and vertebrae: Multilevel moderate degenerative  changes of the spine most prominent at the C5-C6 level. No acute fracture. No aggressive appearing focal osseous lesion or focal pathologic process. Soft tissues and spinal canal: No prevertebral fluid or swelling. No visible canal hematoma. Upper chest: Unremarkable. Other: None. IMPRESSION: 1. Persistent prominent bilateral optic nerves with query underlying edema. Recommend outpatient ophthalmologic follow-up. 2. Otherwise no acute intracranial abnormality. 3. No acute displaced fracture or traumatic listhesis of the cervical spine. 4. Empty sella. Findings is often a normal anatomic variant but can be associated with idiopathic intracranial hypertension (pseudotumor cerebri). Electronically Signed   By: Tish Frederickson M.D.   On: 10/21/2023 08:25    Procedures Procedures    Medications Ordered in ED Medications  doxycycline (VIBRAMYCIN) 100 mg in sodium chloride 0.9 % 250 mL IVPB (100 mg Intravenous New Bag/Given 10/21/23 1335)  enoxaparin (LOVENOX) injection 40 mg (40 mg Subcutaneous Given 10/21/23 1415)  sodium  chloride flush (NS) 0.9 % injection 3 mL (3 mLs Intravenous Given 10/21/23 1416)  insulin aspart (novoLOG) injection 0-9 Units (has no administration in time range)  acetaminophen (TYLENOL) tablet 650 mg (has no administration in time range)    Or  acetaminophen (TYLENOL) suppository 650 mg (has no administration in time range)  polyethylene glycol (MIRALAX / GLYCOLAX) packet 17 g (has no administration in time range)  cefTRIAXone (ROCEPHIN) 1 g in sodium chloride 0.9 % 100 mL IVPB (has no administration in time range)  aspirin EC tablet 81 mg (has no administration in time range)  baclofen (LIORESAL) tablet 10 mg (has no administration in time range)  carbidopa-levodopa (SINEMET IR) 25-100 MG per tablet immediate release 1 tablet (has no administration in time range)  docusate sodium (COLACE) capsule 100 mg (has no administration in time range)  donepezil (ARICEPT) tablet 10 mg (has  no administration in time range)  FLUoxetine (PROZAC) capsule 80 mg (has no administration in time range)  gabapentin (NEURONTIN) capsule 300-600 mg (has no administration in time range)  memantine (NAMENDA) tablet 10 mg (has no administration in time range)  pantoprazole (PROTONIX) EC tablet 40 mg (has no administration in time range)  oxybutynin (DITROPAN) tablet 5 mg (has no administration in time range)  oxyCODONE (Oxy IR/ROXICODONE) immediate release tablet 5 mg (has no administration in time range)  predniSONE (DELTASONE) tablet 5 mg (has no administration in time range)  primidone (MYSOLINE) tablet 100 mg (has no administration in time range)  lamoTRIgine (LAMICTAL) tablet 150 mg (has no administration in time range)  levETIRAcetam (KEPPRA) tablet 750 mg (has no administration in time range)  rosuvastatin (CRESTOR) tablet 10 mg (has no administration in time range)  traZODone (DESYREL) tablet 200 mg (has no administration in time range)  cefTRIAXone (ROCEPHIN) 1 g in sodium chloride 0.9 % 100 mL IVPB (0 g Intravenous Stopped 10/21/23 1322)  ketorolac (TORADOL) 30 MG/ML injection 30 mg (30 mg Intravenous Given 10/21/23 1412)    ED Course/ Medical Decision Making/ A&P Clinical Course as of 10/21/23 1512  Thu Oct 21, 2023  0719 Temp 98.16F [MT]  1134 RN reports she will perform in and out cath [MT]  1332 Pt weaned to room air, no hypoxia.  Admitted to Dr Alinda Money hospitalist.  Pt and family updated about likely UTI as source of infection [MT]    Clinical Course User Index [MT] Shonique Pelphrey, Kermit Balo, MD                                 Medical Decision Making Amount and/or Complexity of Data Reviewed Labs: ordered. Radiology: ordered. ECG/medicine tests: ordered.  Risk Prescription drug management. Decision regarding hospitalization.   This patient presents to the ED with concern for confusion after reported head injury. This involves an extensive number of treatment options, and is  a complaint that carries with it a high risk of complications and morbidity.  The differential diagnosis includes traumatic intracranial bleeding or hemorrhage versus CVA versus metabolic encephalopathy versus urinary tract infection versus pneumonia versus other  Co-morbidities that complicate the patient evaluation: History of urinary tension and recurring UTIs  Additional history obtained from patient's sister and EMS   I ordered and personally interpreted labs.  The pertinent results include: Catheterized UA with positive nitrates and leukocytes and bacteria.  CMP and CBC largely unremarkable.  Troponin is negative.  I ordered imaging studies including CT head and cervical spine,  CT chest I independently visualized and interpreted imaging which showed potential atypical lung infection or small airway disease, potential rib fracture I agree with the radiologist interpretation  The patient was maintained on a cardiac monitor.  I personally viewed and interpreted the cardiac monitored which showed an underlying rhythm of: Sinus rhythm  Per my interpretation the patient's ECG shows sinus rhythm no acute ischemic findings  I ordered medication including Rocephin and doxycycline for cross coverage of UTI as well as potential pulmonary typical infection.  Toradol for pain  I have reviewed the patients home medicines and have made adjustments as needed  Test Considered: Lower suspicion for acute PE; doubt meningitis  After the interventions noted above, I reevaluated the patient and found that they have: stayed the same   Dispostion:  After consideration of the diagnostic results and the patients response to treatment, I feel that the patent would benefit from medical admission.         Final Clinical Impression(s) / ED Diagnoses Final diagnoses:  Acute cystitis without hematuria  Encephalopathy, unspecified type    Rx / DC Orders ED Discharge Orders     None          Terald Sleeper, MD 10/21/23 [phone]

## 2023-10-22 ENCOUNTER — Observation Stay (HOSPITAL_COMMUNITY): Payer: Medicare Other

## 2023-10-22 DIAGNOSIS — G9340 Encephalopathy, unspecified: Secondary | ICD-10-CM | POA: Diagnosis not present

## 2023-10-22 DIAGNOSIS — J9601 Acute respiratory failure with hypoxia: Secondary | ICD-10-CM | POA: Diagnosis not present

## 2023-10-22 DIAGNOSIS — F319 Bipolar disorder, unspecified: Secondary | ICD-10-CM | POA: Diagnosis not present

## 2023-10-22 DIAGNOSIS — N3000 Acute cystitis without hematuria: Secondary | ICD-10-CM | POA: Diagnosis not present

## 2023-10-22 LAB — RESPIRATORY PANEL BY PCR

## 2023-10-22 LAB — CBC
HCT: 33.5 % — ABNORMAL LOW (ref 36.0–46.0)
Hemoglobin: 10.8 g/dL — ABNORMAL LOW (ref 12.0–15.0)
MCH: 31.5 pg (ref 26.0–34.0)
MCHC: 32.2 g/dL (ref 30.0–36.0)
MCV: 97.7 fL (ref 80.0–100.0)
Platelets: 160 10*3/uL (ref 150–400)
RBC: 3.43 MIL/uL — ABNORMAL LOW (ref 3.87–5.11)
RDW: 13.9 % (ref 11.5–15.5)
WBC: 9 10*3/uL (ref 4.0–10.5)
nRBC: 0 % (ref 0.0–0.2)

## 2023-10-22 LAB — COMPREHENSIVE METABOLIC PANEL
ALT: 5 U/L (ref 0–44)
AST: 16 U/L (ref 15–41)
Albumin: 2.7 g/dL — ABNORMAL LOW (ref 3.5–5.0)
Alkaline Phosphatase: 55 U/L (ref 38–126)
Anion gap: 9 (ref 5–15)
BUN: 20 mg/dL (ref 8–23)
CO2: 27 mmol/L (ref 22–32)
Calcium: 8.1 mg/dL — ABNORMAL LOW (ref 8.9–10.3)
Chloride: 105 mmol/L (ref 98–111)
Creatinine, Ser: 1.35 mg/dL — ABNORMAL HIGH (ref 0.44–1.00)
GFR, Estimated: 44 mL/min — ABNORMAL LOW (ref >60)
Glucose, Bld: 93 mg/dL (ref 70–99)
Potassium: 4.3 mmol/L (ref 3.5–5.1)
Sodium: 141 mmol/L (ref 135–145)
Total Bilirubin: 0.6 mg/dL (ref 0.0–1.2)
Total Protein: 5.4 g/dL — ABNORMAL LOW (ref 6.5–8.1)

## 2023-10-22 LAB — GLUCOSE, CAPILLARY
Glucose-Capillary: 106 mg/dL — ABNORMAL HIGH (ref 70–99)
Glucose-Capillary: 120 mg/dL — ABNORMAL HIGH (ref 70–99)
Glucose-Capillary: 118 mg/dL — ABNORMAL HIGH (ref 70–99)

## 2023-10-22 NOTE — Progress Notes (Signed)
 Orthopedic Tech Progress Note Patient Details:  Valerie [name] [birth year] [id number]  CPM Right Knee CPM Right Knee: On Right Knee Flexion (Degrees): 60 Right Knee Extension (Degrees): 0  Post Interventions Patient Tolerated: Well  [name] 10/22/2023, 12:47 PM

## 2023-10-22 NOTE — Progress Notes (Signed)
 Orthopedic Tech Progress Note Patient Details:  Valerie [name] [birth year] [id number]  Ortho Devices Type of Ortho Device: Postop shoe/boot Ortho Device/Splint Location: LLE Ortho Device/Splint Interventions: Ordered, Application, Adjustment   Post Interventions Patient Tolerated: Well Instructions Provided: Care of device  [name] 10/22/2023, 9:02 PM

## 2023-10-22 NOTE — Progress Notes (Signed)
 Orthopedic Tech Progress Note Patient Details:  Valerie [name] [birth year] [id number]  CPM Right Knee CPM Right Knee: Off Right Knee Flexion (Degrees): 75 Right Knee Extension (Degrees): 0 Additional Comments: 2155  Post Interventions Patient Tolerated: Well  [name] 10/22/2023, 9:00 PM

## 2023-10-22 NOTE — Progress Notes (Signed)
     Subjective: Valerie [name] is doing much better today. Answering questions appropriately and following commands. Reports pain is tolerable. Eager to mobilize. She is hopeful to go home.   Objective:   VITALS:   Vitals:   10/21/23 2059 10/22/23 0519 10/22/23 0818 10/22/23 1130  BP: (!) 97/59 (!) 108/59 (!) 98/55 (!) 100/59  Pulse: 72 76 80 77  Resp: 17 18 20    Temp: 99 F (37.2 C) 100.1 F (37.8 C) 99.9 F (37.7 C)   TempSrc: Oral Oral Oral Oral  SpO2: 93% 90% 91% 94%  Weight: 88 kg     Height: 5\' 2"  (1.575 m)      Right knee dressing clean, dry, and intact, mild.   No traumatic wounds, ecchymosis, or rash  Sens DPN, SPN, TN intact  Motor EHL, ext, flex 5/5  DP 2+, PT 2+, No significant edema    Lab Results  Component Value Date   WBC 9.0 10/22/2023   HGB 10.8 (L) 10/22/2023   HCT 33.5 (L) 10/22/2023   MCV 97.7 10/22/2023   PLT 160 10/22/2023   BMET    Component Value Date/Time   NA 141 10/22/2023 0739   K 4.3 10/22/2023 0739   CL 105 10/22/2023 0739   CO2 27 10/22/2023 0739   GLUCOSE 93 10/22/2023 0739   BUN 20 10/22/2023 0739   CREATININE 1.35 (H) 10/22/2023 0739   CREATININE 1.73 (H) 02/08/2018 1020   CALCIUM 8.1 (L) 10/22/2023 0739   CALCIUM 12.0 (H) 08/09/2020 1729   GFRNONAA 44 (L) 10/22/2023 0739   GFRNONAA 32 (L) 02/08/2018 1020   Xray 2/27 TKA components in good alignment, no fracture  Assessment/Plan:     Principal Problem:   Acute metabolic encephalopathy Active Problems:   Anxiety   Morbid obesity (HCC)   Chronic kidney disease, stage 3b (HCC)   Psoriatic arthritis (HCC)   OSA (obstructive sleep apnea)   Neuropathy   Bipolar disorder (HCC)   DM type 2 (diabetes mellitus, type 2) (HCC)   CAD (coronary artery disease)   Essential hypertension   GERD (gastroesophageal reflux disease)   Hyperlipidemia associated with type 2 diabetes mellitus (HCC)   Fall at home, initial encounter   Seizure disorder (HCC)   Parkinson disease  (HCC)   UTI (urinary tract infection)   Doing much better today. Keep dressing clean/dry/ and intact WBAT RLE - awaiting physical therapy CPM machine ordered to use daily for 2 hours   Sandro Burgo A Lucee Brissett 10/22/2023, 1:37 PM   Weber Cooks, MD  Contact information:   [phone] 7am-5pm epic message Dr. Blanchie Dessert, or call office for patient follow up: [phone] After hours and holidays please check [email or web address] for group call information for [facility]

## 2023-10-22 NOTE — Evaluation (Signed)
 Physical Therapy Evaluation Patient Details Name: Valerie [name] MRN: [id number] DOB: 1960/11/30 Today's Date: 10/22/2023  History of Present Illness  63 y.o. female presenting 2/27 after fall at home and development of confusion. UA noted with positive nitrite awaiting follow-up culture. Rt TKA 2/25 and d/c home. PMHx: hypertension, hyperlipidemia, GERD, diabetes, CKD 3B, CAD, urinary retention, subclavian vein occlusion, dementia, Parkinson's disease, anxiety, bipolar disorder, obesity, neuropathy, OSA, psoriatic arthritis, seizure disorder.    Clinical Impression  Pt admitted with above complications. Requires min assist for sit to stand transfer and step pivot to recliner, unsafe to progress gait at this time as she requires min assist for posterior LOB at all times. Family present. Pt states she has arranged 24/7 assist at home and would like to d/c home with HHPT. She is aware that in her current state, family will have to be comfortable providing min assist with all OOB mobility to prevent falling. She feels confident this can be done but would like one additional family member present tomorrow at 10AM to review transfer training. Will plan follow-up at 10AM tomorrow with family. Pt currently with functional limitations due to the deficits listed below (see PT Problem List). Pt will benefit from acute skilled PT to increase their independence and safety with mobility to allow discharge.      If pt does not progress and family cannot provide min assist for safe transfers tomorrow, would suggest considering SNF for short term post-acute rehab.  She has a w/c that family is retrieving- this will be needed to navigate the single step into her home if d/c home (based on current function.) Pt and family verbalize understanding.    If plan is discharge home, recommend the following: A little help with walking and/or transfers;A little help with bathing/dressing/bathroom;Assistance with  cooking/housework;Assist for transportation;Help with stairs or ramp for entrance   Can travel by private vehicle        Equipment Recommendations None recommended by PT  Recommendations for Other Services       Functional Status Assessment Patient has had a recent decline in their functional status and demonstrates the ability to make significant improvements in function in a reasonable and predictable amount of time.     Precautions / Restrictions Precautions Precautions: Knee;Fall Recall of Precautions/Restrictions: Intact Restrictions Weight Bearing Restrictions Per Provider Order: No      Mobility  Bed Mobility Overal bed mobility: Needs Assistance Bed Mobility: Sit to Supine       Sit to supine: Supervision   General bed mobility comments: Educated on use of LLE and/or bed sheet to support RLE into bed. Pt able to lift with bed sheet successfully and reposition self in bed today.    Transfers Overall transfer level: Needs assistance Equipment used: Rolling walker (2 wheels) Transfers: Sit to/from Stand, Bed to chair/wheelchair/BSC Sit to Stand: Min assist   Step pivot transfers: Min assist       General transfer comment: Requires min assist to stand and step pivot to bed from recliner with hands on support at all times due to posterior instability. Max cues and techniques for forward weight shift through RW for support. Rt knee without overt buckling but does have difficulty bearing full weight and shows some instability of Rt knee control when lifting LLE.    Ambulation/Gait               General Gait Details: unable at this time.  Stairs  Wheelchair Mobility     Tilt Bed    Modified Rankin (Stroke Patients Only)       Balance Overall balance assessment: History of Falls, Needs assistance Sitting-balance support: Feet supported Sitting balance-Leahy Scale: Good     Standing balance support: Reliant on assistive device  for balance, Bilateral upper extremity supported Standing balance-Leahy Scale: Poor                               Pertinent Vitals/Pain Pain Assessment Pain Assessment: Faces Faces Pain Scale: Hurts little more Pain Location: R knee Pain Descriptors / Indicators: Aching Pain Intervention(s): Limited activity within patient's tolerance, Monitored during session, Repositioned    Home Living Family/patient expects to be discharged to:: Private residence Living Arrangements: Alone Available Help at Discharge: Family;Available 24 hours/day Type of Home: House Home Access: Stairs to enter Entrance Stairs-Rails: None Entrance Stairs-Number of Steps: 1 Alternate Level Stairs-Number of Steps: 1 flight Home Layout: Two level;1/2 bath on main level Home Equipment: Rolling Walker (2 wheels);BSC/3in1;Cane - single point;Rollator (4 wheels);Grab bars - tub/shower;Shower seat;Wheelchair - manual Additional Comments: daughter working on getting bed to place on first floor    Prior Function Prior Level of Function : Independent/Modified Independent             Mobility Comments: mod I with SPC for all ADLs, self care tasks and IADLs. Post-op TKA pt was ambulating at supervision level to and from bathroom. This is when fall occurred. ADLs Comments: independent, 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,     Extremity/Trunk Assessment   Upper Extremity Assessment Upper Extremity Assessment: Defer to OT evaluation    Lower Extremity Assessment Lower Extremity Assessment: RLE deficits/detail RLE Deficits / Details: Able to SLR with effort, >10 deg lag. RLE: Unable to fully assess due to pain RLE Sensation: WNL       Communication   Communication Communication: No apparent difficulties    Cognition Arousal: Alert Behavior During Therapy: WFL for tasks assessed/performed   PT - Cognitive impairments: No apparent impairments                         Following  commands: Intact       Cueing Cueing Techniques: Verbal cues, Gestural cues     General Comments General comments (skin integrity, edema, etc.): Family present, participated in conversations. Reviewed precautions and goals if pt decides to go home.    Exercises     Assessment/Plan    PT Assessment Patient needs continued PT services  PT Problem List Decreased strength;Decreased range of motion;Decreased activity tolerance;Decreased balance;Decreased mobility;Decreased coordination;Pain;Decreased knowledge of use of DME       PT Treatment Interventions DME instruction;Gait training;Stair training;Functional mobility training;Therapeutic activities;Therapeutic exercise;Balance training;Neuromuscular re-education;Patient/family education;Modalities    PT Goals (Current goals can be found in the Care Plan section)  Acute Rehab PT Goals Patient Stated Goal: Go home, avoid snf PT Goal Formulation: With patient/family Time For Goal Achievement: 11/05/23 Potential to Achieve Goals: Good    Frequency Min 1X/week     Co-evaluation               AM-PAC PT "6 Clicks" Mobility  Outcome Measure Help needed turning from your back to your side while in a flat bed without using bedrails?: None Help needed moving from lying on your back to sitting on the side of a flat bed without using bedrails?: A  Little Help needed moving to and from a bed to a chair (including a wheelchair)?: A Little Help needed standing up from a chair using your arms (e.g., wheelchair or bedside chair)?: A Little Help needed to walk in hospital room?: A Lot Help needed climbing 3-5 steps with a railing? : Total 6 Click Score: 16    End of Session Equipment Utilized During Treatment: Gait belt Activity Tolerance: Patient tolerated treatment well Patient left: with call bell/phone within reach;in bed;with bed alarm set;with family/visitor present Nurse Communication: Other (comment) (Alerted front desk need  for new purewick) PT Visit Diagnosis: Unsteadiness on feet (R26.81);Other abnormalities of gait and mobility (R26.89);Repeated falls (R29.6);Muscle weakness (generalized) (M62.81);Pain;History of falling (Z91.81);Difficulty in walking, not elsewhere classified (R26.2);Other symptoms and signs involving the nervous system (R29.898) Pain - Right/Left: Right Pain - part of body: Knee    Time: 3244-0102 PT Time Calculation (min) (ACUTE ONLY): 31 min   Charges:   PT Evaluation $PT Eval Low Complexity: 1 Low PT Treatments $Therapeutic Activity: 8-22 mins PT General Charges $$ ACUTE PT VISIT: 1 Visit         Kathlyn Sacramento, PT, DPT [facility]  Rehabilitation Services Physical Therapist Office: [phone] Website: [email or web address]   Berton Mount 10/22/2023, 5:18 PM

## 2023-10-22 NOTE — Hospital Course (Addendum)
 Valerie [name] is a 63 y.o. female with medical history significant of hypertension, hyperlipidemia, GERD, diabetes, CKD 3B, CAD, urinary retention, subclavian vein occlusion, dementia, Parkinson's disease, anxiety, bipolar disorder, obesity, neuropathy, OSA, psoriatic arthritis, seizure disorder presenting after fall at home and development of confusion.   History obtained with assistance of patient's sister who lives with her given patient's altered mentation.  Patient underwent a right total knee arthroplasty on 2/25.  She had abrupt change in status the day prior to admission and had a fall around 11 PM. She had a second fall where she hit her mouth on her walker but again did not hit her head. She had some residual confusion since.  Sister reports this is very similar to how she has presented in the past with UTI, she states she feels like it all started around the time or after the time of the fall.  EMS was called and patient initially was noted to be hypoxic on room air in the 80s.  Placed on 6 L by EMS and weaned to 2 L in the ED.  The morning following admission, mentation had improved.  She does have some chronic urinary hesitancy and frequency although does endorse new symptoms of some dysuria.  Urinalysis was noted with positive nitrite, small LE, 11-20 WBC.  She was started on antibiotics on admission as well.  Right knee imaging was also obtained given her fall and recent surgery.  No acute findings were noted.  Further imaging studies were performed due to her recurrent falls. CT chest noted with subacute nondisplaced fracture of the posterior right 10th rib.  A&P:  Acute metabolic encephalopathy - resolved  Fall Acute cystitis -Some confusion on admission; family states patient becomes this way typically when developing UTIs - UA noted with positive nitrite, small LE, 11-20 WBC - Urine culture with Ecoli ESBL; d/c rocephin and give fosfomycin x 1 - PT/OT consulted due to fall and  weakness; home with HHPT - Mentation back to normal    Hypoxia - resolved  - Patient initially requiring 6 L for EMS and has been weaned to 2 L in the ED - wean O2 as able - possible from atelectasis vs poor inspiration in setting of rib fx, debility, etc -RVP, COVID, flu, RSV negative   Status post right knee total arthroplasty - s/p 10/19/23 - continue pain control and prednisone - CPM ordered per ortho  - HHPT at discharge   Left foot pain Multiple toe fractures - bruising noted on toes s/p fall at home PTA - xray notes: "Acute mildly displaced fractures involving the second and third proximal phalanges as described above. Possible additional small fractures at the base of the first proximal phalanx. Acute mildly displaced intra-articular fracture dorsal base of first distal phalanx. - continue postop shoe on left foot to help off load weight from toes  Abnormal CT Head > Persistent prominent optic nerve.  Recommendation is for ophthalmology follow-up per the read.  Hypertension - Not currently on any antihypertensives   Hyperlipidemia - Continue rosuvastatin   GERD - Continue PPI   Diabetes -Last A1c 5.3% on 10/21/2023 - Diet controlled   CKD 3B > Creatinine currently stable 1.4. - Trend renal function and electrolyte   CAD - Continue home ASA, rosuvastatin   Urinary retention - Continue home oxybutynin   Parkinson's Disease - Continue home carbidopa-levodopa   Dementia - Continue home donepezil, Namenda - Is on Leqembi outpatient   Anxiety Bipolar disorder - Continue home Xanax,  fluoxetine, trazodone   Obesity - Noted   OSA - Continue home CPAP   Neuropathy - Continue gabapentin   Seizure disorder - Continue home Lamictal, Keppra, primidone - Is also on gabapentin as above   Psoriatic arthritis - Noted

## 2023-10-22 NOTE — Evaluation (Signed)
 Occupational Therapy Evaluation Patient Details Name: Valerie [name] MRN: [id number] DOB: [birth year] Today's Date: 10/22/2023   History of Present Illness   63 y.o. female with medical history significant of hypertension, hyperlipidemia, GERD, diabetes, CKD 3B, CAD, urinary retention, subclavian vein occlusion, dementia, Parkinson's disease, anxiety, bipolar disorder, obesity, neuropathy, OSA, psoriatic arthritis, seizure disorder presenting after fall at home and development of confusion.     Clinical Impressions Patient admitted for the diagnosis above.  PTA she recently returned home with [facility] PT and assist from family s/p R TKR.  Patient continues to have pain with ROM, R leg weakness, and poor dynamic balance.  Currently she needed overall CGA for supine to sit with increased time for R leg off the bed.  Verbal cues for hand placement with CGA for sit to stand, slightly elevated bed and generalized CGA for simple stand pivot to recliner.  Family was assisting with iADL, lower body ADL and community mobility.  Patient refuses SNF level for post acute rehab.  She would benefit from that, but home with [facility] PT is her goal.  PT Consult is pending, hopefully she will be able to walk with them so home with [facility] and family assist is possible.  OT will continue efforts in the acute setting, she would benefit from hip kit training and continued aggressive mobility.       If plan is discharge home, recommend the following:   Assist for transportation;A little help with bathing/dressing/bathroom;A little help with walking and/or transfers;Assistance with cooking/housework     Functional Status Assessment   Patient has had a recent decline in their functional status and demonstrates the ability to make significant improvements in function in a reasonable and predictable amount of time.     Equipment Recommendations   None recommended by OT     Recommendations for Other Services          Precautions/Restrictions   Precautions Precautions: Knee;Fall Recall of Precautions/Restrictions: Intact Restrictions Weight Bearing Restrictions Per Provider Order: No     Mobility Bed Mobility Overal bed mobility: Needs Assistance Bed Mobility: Supine to Sit     Supine to sit: HOB elevated, Contact guard          Transfers Overall transfer level: Needs assistance Equipment used: Rolling walker (2 wheels) Transfers: Sit to/from Stand, Bed to chair/wheelchair/BSC Sit to Stand: Contact guard assist     Step pivot transfers: Contact guard assist, Min assist            Balance Overall balance assessment: History of Falls, Needs assistance Sitting-balance support: Feet supported Sitting balance-Leahy Scale: Good     Standing balance support: Reliant on assistive device for balance Standing balance-Leahy Scale: Poor                             ADL either performed or assessed with clinical judgement   ADL       Grooming: Wash/dry hands;Wash/dry face;Set up;Sitting               Lower Body Dressing: Moderate assistance;Sit to/from stand   Toilet Transfer: Minimal assistance;Stand-pivot                   Vision Patient Visual Report: No change from baseline       Perception Perception: Not tested       Praxis Praxis: Not tested       Pertinent Vitals/Pain Pain Assessment Pain Assessment: Faces Faces  Pain Scale: Hurts little more Pain Location: R knee Pain Descriptors / Indicators: Aching Pain Intervention(s): Monitored during session     Extremity/Trunk Assessment Upper Extremity Assessment Upper Extremity Assessment: Overall WFL for tasks assessed   Lower Extremity Assessment Lower Extremity Assessment: Defer to PT evaluation       Communication Communication Communication: No apparent difficulties   Cognition Arousal: Alert Behavior During Therapy: WFL for tasks assessed/performed Cognition: Cognition  impaired   Orientation impairments: Time   Memory impairment (select all impairments): Short-term memory Attention impairment (select first level of impairment): Selective attention Executive functioning impairment (select all impairments): Reasoning                   Following commands: Intact       Cueing  General Comments   Cueing Techniques: Verbal cues;Gestural cues   VSS on RA   Exercises     Shoulder Instructions      Home Living Family/patient expects to be discharged to:: Private residence Living Arrangements: Alone Available Help at Discharge: Available PRN/intermittently;Family Type of Home: House Home Access: Stairs to enter [employer] of Steps: 1 Entrance Stairs-Rails: None Home Layout: Two level;1/2 bath on main level Alternate Level Stairs-Number of Steps: 1 flight Alternate Level Stairs-Rails: Left Bathroom Shower/Tub: Producer, television/film/video: Standard Bathroom Accessibility: No   Home Equipment: Agricultural consultant (2 wheels);BSC/3in1;Cane - single point;Rollator (4 wheels);Grab bars - tub/shower;Shower seat   Additional Comments: daughter working on getting bed to place on first floor      Prior Functioning/Environment Prior Level of Function : Independent/Modified Independent             Mobility Comments: mod I with SPC for all ADLs, self care tasks and IADLs ADLs Comments: independent, 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,    OT Problem List: Decreased strength;Decreased range of motion;Decreased activity tolerance;Pain;Increased edema;Decreased safety awareness   OT Treatment/Interventions: Self-care/ADL training;Therapeutic activities;Patient/family education;DME and/or AE instruction;Balance training      OT Goals(Current goals can be found in the care plan section)   Acute Rehab OT Goals Patient Stated Goal: Return home OT Goal Formulation: With patient Time For Goal Achievement: 11/05/23 Potential to  Achieve Goals: Good ADL Goals Pt Will Perform Grooming: with supervision;standing Pt Will Perform Lower Body Dressing: with min assist;sit to/from stand Pt Will Transfer to Toilet: with supervision;ambulating;regular height toilet   OT Frequency:  Min 1X/week    Co-evaluation              AM-PAC OT "6 Clicks" Daily Activity     Outcome Measure Help from another person eating meals?: None Help from another person taking care of personal grooming?: None Help from another person toileting, which includes using toliet, bedpan, or urinal?: A Lot Help from another person bathing (including washing, rinsing, drying)?: A Lot Help from another person to put on and taking off regular upper body clothing?: None Help from another person to put on and taking off regular lower body clothing?: A Lot 6 Click Score: 18   End of Session Equipment Utilized During Treatment: Rolling walker (2 wheels) CPM Right Knee CPM Right Knee: Off Right Knee Flexion (Degrees): 60 Right Knee Extension (Degrees): 0 Nurse Communication: Mobility status  Activity Tolerance: Patient tolerated treatment well Patient left: in chair;with call bell/phone within reach;with family/visitor present  OT Visit Diagnosis: Unsteadiness on feet (R26.81);Muscle weakness (generalized) (M62.81);History of falling (Z91.81);Pain Pain - Right/Left: Right Pain - part of body: Knee  Time: 9811-9147 OT Time Calculation (min): 22 min Charges:  OT General Charges $OT Visit: 1 Visit OT Evaluation $OT Eval Moderate Complexity: 1 Mod  10/22/2023  RP, OTR/L  Acute Rehabilitation Services  Office:  [phone]   Suzanna Obey 10/22/2023, 3:53 PM

## 2023-10-22 NOTE — Plan of Care (Signed)

## 2023-10-22 NOTE — Progress Notes (Signed)
 Orthopedic Tech Progress Note Patient Details:  Valerie [name] 1961/08/21 [id number]  CPM Right Knee CPM Right Knee: On Right Knee Flexion (Degrees): 75 Right Knee Extension (Degrees): 0 Additional Comments: 1900  Post Interventions Patient Tolerated: Well  [name] 10/22/2023, 8:08 PM

## 2023-10-22 NOTE — Progress Notes (Addendum)
 Transition of Care [facility]) - Inpatient Brief Assessment   Patient Details  Name: Valerie [name] MRN: [id number] Date of Birth: 1961/06/17  [facility] CM/SW Contact:    Janae Bridgeman, RN Phone Number: 10/22/2023, 12:02 PM   Clinical Narrative: CM met with the patient and daughter at the bedside to discuss TOC needs.  [place] letter provided at the bedside.  Patient admitted to the hospital for Acute metabolic encephalopathy and fever.  Patient was recently discharged home after Right Total knee repair by Dr. Dion Saucier and plans to return home again once medically stable.  Patient is active with [facility] for PT.  Daughter states that she has DME at home home including CPAP, Cane Rolator, RW, and CPM machine for new right knee repair.  Daughter requested CPM machine while she is in the hospital if possible.  I included bedside nursing, hospitalist and Orthopedic MD in the chat for possible need for CPM machine while she is admitted.  Patient's family is providing 24 hour care at the home for the next 2 weeks while she is recovering.  CM will continue to follow the patient for TOC needs.  Patient plans to return home with home health services when stable.   Transition of Care Asessment: Insurance and Status: (P) Insurance coverage has been reviewed Patient has primary care physician: (P) Yes Home environment has been reviewed: (P) from home with daughter Prior level of function:: (P) RW, family providing 24 hour care Prior/Current Home Services: (P) Current home services Social Drivers of Health Review: (P) SDOH reviewed needs interventions Readmission risk has been reviewed: (P) Yes Transition of care needs: (P) transition of care needs identified, TOC will continue to follow

## 2023-10-22 NOTE — Care Management Obs Status (Cosign Needed)
 MEDICARE OBSERVATION STATUS NOTIFICATION   Patient Details  Name: Clarie Camey MRN: [id number] Date of Birth: 1961/05/14   Medicare Observation Status Notification Given:  Yes    Janae Bridgeman, RN 10/22/2023, 11:42 AM

## 2023-10-22 NOTE — Progress Notes (Signed)
 Orthopedic Tech Progress Note Patient Details:  Valerie [name] 15-Jan-1961 [id number]  CPM Right Knee CPM Right Knee: Off Right Knee Flexion (Degrees): 60 Right Knee Extension (Degrees): 0  Post Interventions Patient Tolerated: Well  [name] 10/22/2023, 2:52 PM

## 2023-10-22 NOTE — Progress Notes (Signed)
 Progress Note    Valerie [name]   [id number]  DOB: 1961/07/28  DOA: 10/21/2023     0 PCP: Cristino Martes, NP  Initial CC: fall at home; confusion   Hospital Course: Valerie [name] is a 63 y.o. female with medical history significant of hypertension, hyperlipidemia, GERD, diabetes, CKD 3B, CAD, urinary retention, subclavian vein occlusion, dementia, Parkinson's disease, anxiety, bipolar disorder, obesity, neuropathy, OSA, psoriatic arthritis, seizure disorder presenting after fall at home and development of confusion.   History obtained with assistance of patient's sister who lives with her given patient's altered mentation.  Patient underwent a right total knee arthroplasty on 2/25.  She had abrupt change in status the day prior to admission and had a fall around 11 PM. She had a second fall where she hit her mouth on her walker but again did not hit her head. She had some residual confusion since.  Sister reports this is very similar to how she has presented in the past with UTI, she states she feels like it all started around the time or after the time of the fall.  EMS was called and patient initially was noted to be hypoxic on room air in the 80s.  Placed on 6 L by EMS and weaned to 2 L in the ED.  The morning following admission, mentation had improved.  She does have some chronic urinary hesitancy and frequency although does endorse new symptoms of some dysuria.  Urinalysis was noted with positive nitrite, small LE, 11-20 WBC.  She was started on antibiotics on admission as well.  Right knee imaging was also obtained given her fall and recent surgery.  No acute findings were noted.  Further imaging studies were performed due to her recurrent falls. CT chest noted with subacute nondisplaced fracture of the posterior right 10th rib.  A&P:  Acute metabolic encephalopathy Fall Acute cystitis -Some confusion on admission; family states patient becomes this way typically when developing  UTIs - UA noted with positive nitrite, small LE, 11-20 WBC - Follow-up urine culture - Continue Rocephin - PT/OT consulted due to fall and weakness - Mentation already improved this morning   Hypoxia - Patient initially requiring 6 L for EMS and has been weaned to 2 L in the ED - wean O2 as able - possible from atelectasis vs poor inspiration in setting of rib fx, debility, etc -Follow-up RVP; COVID, flu, RSV negative   Status post right knee total arthroplasty - s/p 10/19/23 - continue pain control and prednisone - CPM ordered per ortho   Left foot pain - bruising noted on toes s/p fall at home PTA - likely sprained, but will check xray as well  Abnormal CT Head > Persistent prominent optic nerve.  Recommendation is for ophthalmology follow-up per the read.  Hypertension - Not currently on any antihypertensives   Hyperlipidemia - Continue rosuvastatin   GERD - Continue PPI   Diabetes - SSI   CKD 3B > Creatinine currently stable 1.4. - Trend renal function and electrolytes   CAD - Continue home ASA, rosuvastatin   Urinary retention - Continue home oxybutynin   Parkinson's Disease - Continue home carbidopa-levodopa   Dementia - Continue home donepezil, Namenda - Is on Leqembi outpatient   Anxiety Bipolar disorder - Continue home Xanax, fluoxetine, trazodone   Obesity - Noted   OSA - Continue home CPAP   Neuropathy - Continue gabapentin   Seizure disorder - Continue home Lamictal, Keppra, primidone - Is also on gabapentin as above  Psoriatic arthritis - Noted  Interval History:  More awake and alert when seen this morning.  Family present bedside as well. Also noting some pain in her left foot with some bruising on her toes after her fall at home.  She seems to have had multiple falls over the past couple months.  PT has been ordered.   Old records reviewed in assessment of this patient  Antimicrobials: Rocephin 2/27 >> current  Doxy  2/27 x 1   DVT prophylaxis:  enoxaparin (LOVENOX) injection 40 mg Start: 10/21/23 1400   Code Status:   Code Status: Full Code  Mobility Assessment (Last 72 Hours)     Mobility Assessment     Row Name 10/21/23 2123           Does patient have an order for bedrest or is patient medically unstable No - Continue assessment       What is the highest level of mobility based on the progressive mobility assessment? Level 2 (Chairfast) - Balance while sitting on edge of bed and cannot stand       Is the above level different from baseline mobility prior to current illness? Yes - Recommend PT order                Barriers to discharge: none Disposition Plan:  TBD HH orders placed:  Status is: Obs  Objective: Blood pressure (!) 100/59, pulse 77, temperature 99.9 F (37.7 C), temperature source Oral, resp. rate 20, height 5\' 2"  (1.575 m), weight 88 kg, SpO2 94%.  Examination:  Physical Exam Constitutional:      Appearance: Normal appearance.  HENT:     Head: Normocephalic and atraumatic.     Mouth/Throat:     Mouth: Mucous membranes are moist.  Eyes:     Extraocular Movements: Extraocular movements intact.  Cardiovascular:     Rate and Rhythm: Normal rate and regular rhythm.  Pulmonary:     Effort: Pulmonary effort is normal. No respiratory distress.     Breath sounds: Normal breath sounds. No wheezing.  Abdominal:     General: Bowel sounds are normal. There is no distension.     Palpations: Abdomen is soft.     Tenderness: There is no abdominal tenderness.  Musculoskeletal:        General: Normal range of motion.     Cervical back: Normal range of motion and neck supple.  Skin:    General: Skin is warm and dry.  Neurological:     General: No focal deficit present.     Mental Status: She is alert.  Psychiatric:        Mood and Affect: Mood normal.      Consultants:    Procedures:    Data Reviewed: Results for orders placed or performed during the  hospital encounter of 10/21/23 (from the past 24 hours)  CBG monitoring, ED     Status: Abnormal   Collection Time: 10/21/23  4:55 PM  Result Value Ref Range   Glucose-Capillary 114 (H) 70 - 99 mg/dL  Glucose, capillary     Status: Abnormal   Collection Time: 10/21/23  9:15 PM  Result Value Ref Range   Glucose-Capillary 140 (H) 70 - 99 mg/dL  Hemoglobin A2Z     Status: None   Collection Time: 10/21/23 10:53 PM  Result Value Ref Range   Hgb A1c MFr Bld 5.3 4.8 - 5.6 %   Mean Plasma Glucose 105.41 mg/dL  Comprehensive metabolic panel  Status: Abnormal   Collection Time: 10/22/23  7:39 AM  Result Value Ref Range   Sodium 141 135 - 145 mmol/L   Potassium 4.3 3.5 - 5.1 mmol/L   Chloride 105 98 - 111 mmol/L   CO2 27 22 - 32 mmol/L   Glucose, Bld 93 70 - 99 mg/dL   BUN 20 8 - 23 mg/dL   Creatinine, Ser 2.95 (H) 0.44 - 1.00 mg/dL   Calcium 8.1 (L) 8.9 - 10.3 mg/dL   Total Protein 5.4 (L) 6.5 - 8.1 g/dL   Albumin 2.7 (L) 3.5 - 5.0 g/dL   AST 16 15 - 41 U/L   ALT 5 0 - 44 U/L   Alkaline Phosphatase 55 38 - 126 U/L   Total Bilirubin 0.6 0.0 - 1.2 mg/dL   GFR, Estimated 44 (L) >60 mL/min   Anion gap 9 5 - 15  CBC     Status: Abnormal   Collection Time: 10/22/23  7:39 AM  Result Value Ref Range   WBC 9.0 4.0 - 10.5 K/uL   RBC 3.43 (L) 3.87 - 5.11 MIL/uL   Hemoglobin 10.8 (L) 12.0 - 15.0 g/dL   HCT 62.1 (L) 30.8 - 65.7 %   MCV 97.7 80.0 - 100.0 fL   MCH 31.5 26.0 - 34.0 pg   MCHC 32.2 30.0 - 36.0 g/dL   RDW 84.6 96.2 - 95.2 %   Platelets 160 150 - 400 K/uL   nRBC 0.0 0.0 - 0.2 %  Glucose, capillary     Status: Abnormal   Collection Time: 10/22/23  9:43 AM  Result Value Ref Range   Glucose-Capillary 106 (H) 70 - 99 mg/dL  Glucose, capillary     Status: Abnormal   Collection Time: 10/22/23 11:38 AM  Result Value Ref Range   Glucose-Capillary 120 (H) 70 - 99 mg/dL    I have reviewed pertinent nursing notes, vitals, labs, and images as necessary. I have ordered labwork to  follow up on as indicated.  I have reviewed the last notes from staff over past 24 hours. I have discussed patient's care plan and test results with nursing staff, CM/SW, and other staff as appropriate.  Time spent: Greater than 50% of the 55 minute visit was spent in counseling/coordination of care for the patient as laid out in the A&P.   LOS: 0 days   Lewie Chamber, MD [facility] 10/22/2023, 1:12 PM

## 2023-10-23 DIAGNOSIS — G9341 Metabolic encephalopathy: Secondary | ICD-10-CM

## 2023-10-23 DIAGNOSIS — W19XXXA Unspecified fall, initial encounter: Secondary | ICD-10-CM | POA: Diagnosis not present

## 2023-10-23 DIAGNOSIS — J9601 Acute respiratory failure with hypoxia: Secondary | ICD-10-CM | POA: Diagnosis not present

## 2023-10-23 DIAGNOSIS — N3000 Acute cystitis without hematuria: Secondary | ICD-10-CM | POA: Diagnosis not present

## 2023-10-23 LAB — URINE CULTURE: Culture: >=100000 — AB

## 2023-10-23 LAB — GLUCOSE, CAPILLARY
Glucose-Capillary: 110 mg/dL — ABNORMAL HIGH (ref 70–99)
Glucose-Capillary: 193 mg/dL — ABNORMAL HIGH (ref 70–99)
Glucose-Capillary: 89 mg/dL (ref 70–99)

## 2023-10-23 MED ORDER — FOSFOMYCIN TROMETHAMINE 3 G PO PACK
3.0000 g | PACK | Freq: Once | ORAL | Status: AC
  Administered 2023-10-23: 3 g via ORAL
  Filled 2023-10-23: qty 3

## 2023-10-23 NOTE — Progress Notes (Signed)
   10/23/23 2040  BiPAP/CPAP/SIPAP  Reason BIPAP/CPAP not in use Non-compliant (does wear cpap at home 100% of the time, but will pass on wearing one tonight, plan is to go home tomorrow)

## 2023-10-23 NOTE — Progress Notes (Signed)
 Progress Note    Valerie [name]   ZOX:[id number]  DOB: 05-18-1961  DOA: 10/21/2023     0 PCP: Cristino Martes, NP  Initial CC: fall at home; confusion   Hospital Course: [name] is a 63 y.o. female with medical history significant of hypertension, hyperlipidemia, GERD, diabetes, CKD 3B, CAD, urinary retention, subclavian vein occlusion, dementia, Parkinson's disease, anxiety, bipolar disorder, obesity, neuropathy, OSA, psoriatic arthritis, seizure disorder presenting after fall at home and development of confusion.   History obtained with assistance of patient's sister who lives with her given patient's altered mentation.  Patient underwent a right total knee arthroplasty on 2/25.  She had abrupt change in status the day prior to admission and had a fall around 11 PM. She had a second fall where she hit her mouth on her walker but again did not hit her head. She had some residual confusion since.  Sister reports this is very similar to how she has presented in the past with UTI, she states she feels like it all started around the time or after the time of the fall.  EMS was called and patient initially was noted to be hypoxic on room air in the 80s.  Placed on 6 L by EMS and weaned to 2 L in the ED.  The morning following admission, mentation had improved.  She does have some chronic urinary hesitancy and frequency although does endorse new symptoms of some dysuria.  Urinalysis was noted with positive nitrite, small LE, 11-20 WBC.  She was started on antibiotics on admission as well.  Right knee imaging was also obtained given her fall and recent surgery.  No acute findings were noted.  Further imaging studies were performed due to her recurrent falls. CT chest noted with subacute nondisplaced fracture of the posterior right 10th rib.  A&P:  Acute metabolic encephalopathy - resolved  Fall Acute cystitis -Some confusion on admission; family states patient becomes this way typically  when developing UTIs - UA noted with positive nitrite, small LE, 11-20 WBC - Urine culture with Ecoli ESBL; d/c rocephin and give fosfomycin x 1 - PT/OT consulted due to fall and weakness; home with HHPT - Mentation back to normal    Hypoxia - resolved  - Patient initially requiring 6 L for EMS and has been weaned to 2 L in the ED - wean O2 as able - possible from atelectasis vs poor inspiration in setting of rib fx, debility, etc -RVP, COVID, flu, RSV negative   Status post right knee total arthroplasty - s/p 10/19/23 - continue pain control and prednisone - CPM ordered per ortho   Left foot pain Multiple toe fractures - bruising noted on toes s/p fall at home PTA - xray notes: "Acute mildly displaced fractures involving the second and third proximal phalanges as described above. Possible additional small fractures at the base of the first proximal phalanx. Acute mildly displaced intra-articular fracture dorsal base of first distal phalanx. - continue postop shoe on left foot to help off load weight from toes  Abnormal CT Head > Persistent prominent optic nerve.  Recommendation is for ophthalmology follow-up per the read.  Hypertension - Not currently on any antihypertensives   Hyperlipidemia - Continue rosuvastatin   GERD - Continue PPI   Diabetes - SSI   CKD 3B > Creatinine currently stable 1.4. - Trend renal function and electrolytes   CAD - Continue home ASA, rosuvastatin   Urinary retention - Continue home oxybutynin   Parkinson's Disease -  Continue home carbidopa-levodopa   Dementia - Continue home donepezil, Namenda - Is on Leqembi outpatient   Anxiety Bipolar disorder - Continue home Xanax, fluoxetine, trazodone   Obesity - Noted   OSA - Continue home CPAP   Neuropathy - Continue gabapentin   Seizure disorder - Continue home Lamictal, Keppra, primidone - Is also on gabapentin as above   Psoriatic arthritis - Noted  Interval History:   Mentation has improved back to normal.  Reviewed left foot x-ray findings showing fractured toes.  Did fairly well with therapy this morning and tentative plan is for discharging home tomorrow.   Old records reviewed in assessment of this patient  Antimicrobials: Rocephin 2/27 >> 3/1  Doxy 2/27 x 1  Fosfomycin 3/1 x 1  DVT prophylaxis:  enoxaparin (LOVENOX) injection 40 mg Start: 10/21/23 1400   Code Status:   Code Status: Full Code  Mobility Assessment (Last 72 Hours)     Mobility Assessment     Row Name 10/23/23 1200 10/23/23 1122 10/23/23 0915 10/22/23 2130 10/22/23 1700   Does patient have an order for bedrest or is patient medically unstable No - Continue assessment -- -- No - Continue assessment --   What is the highest level of mobility based on the progressive mobility assessment? Level 4 (Walks with assist in room) - Balance while marching in place and cannot step forward and back - Complete Level 4 (Walks with assist in room) - Balance while marching in place and cannot step forward and back - Complete Level 3 (Stands with assist) - Balance while standing  and cannot march in place Level 1 (Bedfast) - Unable to balance while sitting on edge of bed Level 3 (Stands with assist) - Balance while standing  and cannot march in place   Is the above level different from baseline mobility prior to current illness? No - Consider discontinuing PT/OT -- -- -- --    Row Name 10/22/23 1551 10/22/23 1115 10/21/23 2123       Does patient have an order for bedrest or is patient medically unstable -- No - Continue assessment No - Continue assessment     What is the highest level of mobility based on the progressive mobility assessment? Level 4 (Walks with assist in room) - Balance while marching in place and cannot step forward and back - Complete Level 2 (Chairfast) - Balance while sitting on edge of bed and cannot stand Level 2 (Chairfast) - Balance while sitting on edge of bed and cannot  stand     Is the above level different from baseline mobility prior to current illness? -- Yes - Recommend PT order Yes - Recommend PT order              Barriers to discharge: none Disposition Plan:  Home with HHPT HH orders placed:  Status is: Obs  Objective: Blood pressure 108/63, pulse 97, temperature 99.8 F (37.7 C), temperature source Oral, resp. rate 18, height 5\' 2"  (1.575 m), weight 88 kg, SpO2 93%.  Examination:  Physical Exam Constitutional:      Appearance: Normal appearance.  HENT:     Head: Normocephalic and atraumatic.     Mouth/Throat:     Mouth: Mucous membranes are moist.  Eyes:     Extraocular Movements: Extraocular movements intact.  Cardiovascular:     Rate and Rhythm: Normal rate and regular rhythm.  Pulmonary:     Effort: Pulmonary effort is normal. No respiratory distress.     Breath  sounds: Normal breath sounds. No wheezing.  Abdominal:     General: Bowel sounds are normal. There is no distension.     Palpations: Abdomen is soft.     Tenderness: There is no abdominal tenderness.  Musculoskeletal:        General: Normal range of motion.     Cervical back: Normal range of motion and neck supple.  Skin:    General: Skin is warm and dry.  Neurological:     General: No focal deficit present.     Mental Status: She is alert.  Psychiatric:        Mood and Affect: Mood normal.      Consultants:    Procedures:    Data Reviewed: Results for orders placed or performed during the hospital encounter of 10/21/23 (from the past 24 hours)  Glucose, capillary     Status: Abnormal   Collection Time: 10/22/23  5:44 PM  Result Value Ref Range   Glucose-Capillary 118 (H) 70 - 99 mg/dL  Glucose, capillary     Status: Abnormal   Collection Time: 10/23/23  7:50 AM  Result Value Ref Range   Glucose-Capillary 110 (H) 70 - 99 mg/dL   Comment 1 Notify RN    Comment 2 Document in Chart   Glucose, capillary     Status: Abnormal   Collection Time:  10/23/23 11:35 AM  Result Value Ref Range   Glucose-Capillary 193 (H) 70 - 99 mg/dL   Comment 1 Notify RN    Comment 2 Document in Chart     I have reviewed pertinent nursing notes, vitals, labs, and images as necessary. I have ordered labwork to follow up on as indicated.  I have reviewed the last notes from staff over past 24 hours. I have discussed patient's care plan and test results with nursing staff, CM/SW, and other staff as appropriate.  Time spent: Greater than 50% of the 55 minute visit was spent in counseling/coordination of care for the patient as laid out in the A&P.   LOS: 0 days   Lewie Chamber, MD [facility] 10/23/2023, 3:17 PM

## 2023-10-23 NOTE — Progress Notes (Signed)
   10/23/23 0012  BiPAP/CPAP/SIPAP  BiPAP/CPAP/SIPAP Pt Type Adult  Reason BIPAP/CPAP not in use Non-compliant

## 2023-10-23 NOTE — Plan of Care (Signed)

## 2023-10-23 NOTE — Progress Notes (Signed)
 Physical Therapy Treatment Patient Details Name: Valerie [name] MRN: [id number] DOB: Dec 23, 1960 Today's Date: 10/23/2023   History of Present Illness 63 y.o. female admitted 2/27 after fall at home, AMS, UTI. PMhx: Rt TKA 2/25 and d/c home. HTN, HLD, DM, GERD, CKD, CAD, urinary retention, subclavian vein occlusion, dementia, Parkinson's disease, anxiety, bipolar disorder, obesity, neuropathy, OSA, psoriatic arthritis, seizure disorder.    PT Comments  Pt pleasant and able to progress to in room gait this session as well as perform HEP. Daughter and friend present during session and able to assist at D/C. Pt educated for postioning, HEP, transfers, gait and stairs. Will continue to follow but appropriate mobility for return home.     If plan is discharge home, recommend the following: A little help with walking and/or transfers;A little help with bathing/dressing/bathroom;Assistance with cooking/housework;Assist for transportation;Help with stairs or ramp for entrance   Can travel by private vehicle        Equipment Recommendations  None recommended by PT    Recommendations for Other Services       Precautions / Restrictions Precautions Precautions: Knee;Fall Recall of Precautions/Restrictions: Intact Restrictions Weight Bearing Restrictions Per Provider Order: No     Mobility  Bed Mobility               General bed mobility comments: in chair on arrival and end of session    Transfers Overall transfer level: Needs assistance   Transfers: Sit to/from Stand Sit to Stand: Contact guard assist           General transfer comment: cues for hand placement and safety to rise from recliner and BSC    Ambulation/Gait Ambulation/Gait assistance: Contact guard assist Gait Distance (Feet): 25 Feet Assistive device: Rolling walker (2 wheels) Gait Pattern/deviations: Step-to pattern, Decreased stride length, Trunk flexed   Gait velocity interpretation: <1.8 ft/sec, indicate  of risk for recurrent falls   General Gait Details: decreased stance with flexed trunk, cues for sequence, posture and safety. Pt walked 4' limited by incontinence then additional 25' after pericare and seated rest   Stairs         General stair comments: demonstrated backward with rW on one step with daughter and friend present   Wheelchair Mobility     Tilt Bed    Modified Rankin (Stroke Patients Only)       Balance Overall balance assessment: History of Falls, Needs assistance Sitting-balance support: Feet supported, No upper extremity supported Sitting balance-Leahy Scale: Good     Standing balance support: Reliant on assistive device for balance, Bilateral upper extremity supported Standing balance-Leahy Scale: Poor Standing balance comment: RW in standing                            Communication Communication Communication: No apparent difficulties  Cognition Arousal: Alert Behavior During Therapy: WFL for tasks assessed/performed   PT - Cognitive impairments: No apparent impairments                         Following commands: Intact      Cueing Cueing Techniques: Verbal cues  Exercises Total Joint Exercises Heel Slides: AAROM, Right, Seated, 10 reps Hip ABduction/ADduction: AROM, Right, 10 reps, Seated Straight Leg Raises: Right, 10 reps, AAROM, Seated Knee Flexion: AAROM, Right, Seated, 10 reps    General Comments        Pertinent Vitals/Pain Pain Assessment Pain Score: 4  Pain Location: Rt  knee Pain Descriptors / Indicators: Aching, Sore Pain Intervention(s): Limited activity within patient's tolerance, Monitored during session, Premedicated before session, Repositioned, Ice applied    Home Living                          Prior Function            PT Goals (current goals can now be found in the care plan section) Progress towards PT goals: Progressing toward goals    Frequency    Min 1X/week       PT Plan      Co-evaluation              AM-PAC PT "6 Clicks" Mobility   Outcome Measure  Help needed turning from your back to your side while in a flat bed without using bedrails?: A Little Help needed moving from lying on your back to sitting on the side of a flat bed without using bedrails?: A Little Help needed moving to and from a bed to a chair (including a wheelchair)?: A Little Help needed standing up from a chair using your arms (e.g., wheelchair or bedside chair)?: A Little Help needed to walk in hospital room?: A Little Help needed climbing 3-5 steps with a railing? : A Lot 6 Click Score: 17    End of Session Equipment Utilized During Treatment: Gait belt Activity Tolerance: Patient tolerated treatment well Patient left: in chair;with call bell/phone within reach;with family/visitor present Nurse Communication: Mobility status;Precautions PT Visit Diagnosis: Other abnormalities of gait and mobility (R26.89);Muscle weakness (generalized) (M62.81);Difficulty in walking, not elsewhere classified (R26.2);History of falling (Z91.81)     Time: 1010-1045 PT Time Calculation (min) (ACUTE ONLY): 35 min  Charges:    $Gait Training: 8-22 mins $Therapeutic Activity: 8-22 mins PT General Charges $$ ACUTE PT VISIT: 1 Visit                     Merryl Hacker, PT Acute Rehabilitation Services Office: [phone]    Enedina Finner Braniyah Besse 10/23/2023, 11:23 AM

## 2023-10-24 DIAGNOSIS — G9341 Metabolic encephalopathy: Secondary | ICD-10-CM | POA: Diagnosis not present

## 2023-10-24 DIAGNOSIS — N3000 Acute cystitis without hematuria: Secondary | ICD-10-CM | POA: Diagnosis not present

## 2023-10-24 LAB — GLUCOSE, CAPILLARY: Glucose-Capillary: 104 mg/dL — ABNORMAL HIGH (ref 70–99)

## 2023-10-24 MED ORDER — MORPHINE SULFATE (PF) 2 MG/ML IV SOLN
2.0000 mg | Freq: Once | INTRAVENOUS | Status: AC | PRN
  Administered 2023-10-24: 2 mg via INTRAVENOUS
  Filled 2023-10-24: qty 1

## 2023-10-24 NOTE — Plan of Care (Signed)

## 2023-10-24 NOTE — Progress Notes (Signed)
 Orthopedic Tech Progress Note Patient Details:  Valerie [name] 04/30/1961 [id number]  CPM Right Knee CPM Right Knee: On Right Knee Flexion (Degrees): 90 Right Knee Extension (Degrees): 0 Additional Comments: 2155  Post Interventions Patient Tolerated: Well Instructions Provided: Care of device  Trinna Post 10/24/2023, 7:05 AM

## 2023-10-24 NOTE — Discharge Summary (Signed)
 Physician Discharge Summary   Irish Breisch ZOX:[id number] DOB: 1961-07-29 DOA: 10/21/2023  PCP: Cristino Martes, NP  Admit date: 10/21/2023 Discharge date: 10/24/2023  Admitted From: Home Disposition:  Home Discharging physician: Lewie Chamber, MD Barriers to discharge: none  Recommendations at discharge:  Consider referral to ophthalmology: Re: Persistent prominent optic nerve.  Recommendation is for ophthalmology follow-up per radiology report   Home Health: PT Equipment/Devices: none  Discharge Condition: stable CODE STATUS: Full  Diet recommendation:  Diet Orders (From admission, onward)     Start     Ordered   10/24/23 0000  Diet general        10/24/23 1209   10/21/23 1329  Diet regular Room service appropriate? Yes; Fluid consistency: Thin  Diet effective now       Question Answer Comment  Room service appropriate? Yes   Fluid consistency: Thin      10/21/23 1336            Hospital Course: Valerie [name] is a 63 y.o. female with medical history significant of hypertension, hyperlipidemia, GERD, diabetes, CKD 3B, CAD, urinary retention, subclavian vein occlusion, dementia, Parkinson's disease, anxiety, bipolar disorder, obesity, neuropathy, OSA, psoriatic arthritis, seizure disorder presenting after fall at home and development of confusion.   History obtained with assistance of patient's sister who lives with her given patient's altered mentation.  Patient underwent a right total knee arthroplasty on 2/25.  She had abrupt change in status the day prior to admission and had a fall around 11 PM. She had a second fall where she hit her mouth on her walker but again did not hit her head. She had some residual confusion since.  Sister reports this is very similar to how she has presented in the past with UTI, she states she feels like it all started around the time or after the time of the fall.  EMS was called and patient initially was noted to be hypoxic on room air in  the 80s.  Placed on 6 L by EMS and weaned to 2 L in the ED.  The morning following admission, mentation had improved.  She does have some chronic urinary hesitancy and frequency although does endorse new symptoms of some dysuria.  Urinalysis was noted with positive nitrite, small LE, 11-20 WBC.  She was started on antibiotics on admission as well.  Right knee imaging was also obtained given her fall and recent surgery.  No acute findings were noted.  Further imaging studies were performed due to her recurrent falls. CT chest noted with subacute nondisplaced fracture of the posterior right 10th rib.  A&P:  Acute metabolic encephalopathy - resolved  Fall Acute cystitis -Some confusion on admission; family states patient becomes this way typically when developing UTIs - UA noted with positive nitrite, small LE, 11-20 WBC - Urine culture with Ecoli ESBL; d/c rocephin and give fosfomycin x 1 - PT/OT consulted due to fall and weakness; home with HHPT - Mentation back to normal    Hypoxia - resolved  - Patient initially requiring 6 L for EMS and has been weaned to 2 L in the ED - wean O2 as able - possible from atelectasis vs poor inspiration in setting of rib fx, debility, etc -RVP, COVID, flu, RSV negative   Status post right knee total arthroplasty - s/p 10/19/23 - continue pain control and prednisone - CPM ordered per ortho  - HHPT at discharge   Left foot pain Multiple toe fractures - bruising noted on toes  s/p fall at home PTA - xray notes: "Acute mildly displaced fractures involving the second and third proximal phalanges as described above. Possible additional small fractures at the base of the first proximal phalanx. Acute mildly displaced intra-articular fracture dorsal base of first distal phalanx. - continue postop shoe on left foot to help off load weight from toes  Abnormal CT Head > Persistent prominent optic nerve.  Recommendation is for ophthalmology follow-up per the  read.  Hypertension - Not currently on any antihypertensives   Hyperlipidemia - Continue rosuvastatin   GERD - Continue PPI   Diabetes -Last A1c 5.3% on 10/21/2023 - Diet controlled   CKD 3B > Creatinine currently stable 1.4. - Trend renal function and electrolyte   CAD - Continue home ASA, rosuvastatin   Urinary retention - Continue home oxybutynin   Parkinson's Disease - Continue home carbidopa-levodopa   Dementia - Continue home donepezil, Namenda - Is on Leqembi outpatient   Anxiety Bipolar disorder - Continue home Xanax, fluoxetine, trazodone   Obesity - Noted   OSA - Continue home CPAP   Neuropathy - Continue gabapentin   Seizure disorder - Continue home Lamictal, Keppra, primidone - Is also on gabapentin as above   Psoriatic arthritis - Noted   The patient's acute and chronic medical conditions were treated accordingly. On day of discharge, patient was felt deemed stable for discharge. Patient/family member advised to call PCP or come back to ER if needed.   Principal Diagnosis: Acute metabolic encephalopathy  Discharge Diagnoses: Active Hospital Problems   Diagnosis Date Noted   Acute metabolic encephalopathy 10/21/2023    Priority: 1.   UTI (urinary tract infection) 10/21/2023    Priority: 2.   Fall at home, initial encounter 02/11/2023    Priority: 2.   Parkinson disease (HCC) 07/28/2023   Seizure disorder (HCC) 07/28/2023   Hyperlipidemia associated with type 2 diabetes mellitus (HCC) 10/22/2022   GERD (gastroesophageal reflux disease) 10/22/2022   CAD (coronary artery disease) 06/04/2020   DM type 2 (diabetes mellitus, type 2) (HCC) 11/14/2019   Chronic kidney disease, stage 3b (HCC)    Anxiety    Bipolar disorder (HCC)    Morbid obesity (HCC)    Neuropathy    OSA (obstructive sleep apnea)    Psoriatic arthritis (HCC)    Essential hypertension 10/29/2014    Resolved Hospital Problems   Diagnosis Date Noted Date Resolved    Acute respiratory failure with hypoxia (HCC) 07/19/2020 10/22/2023    Priority: 3.     Discharge Instructions     Diet general   Complete by: As directed    Increase activity slowly   Complete by: As directed       Allergies as of 10/24/2023       Reactions   Carbamazepine Other (See Comments)   Parkinsons like symptoms tremors   Sertraline Hcl Other (See Comments)   Parkinsons like symptoms        Medication List     TAKE these medications    ALPRAZolam 0.5 MG tablet Commonly known as: XANAX Take 0.5 tablets (0.25 mg total) by mouth 3 (three) times daily as needed for anxiety. What changed: how much to take   aspirin EC 81 MG tablet Take 81 mg by mouth daily. Swallow whole.   baclofen 10 MG tablet Commonly known as: LIORESAL Take 1 tablet (10 mg total) by mouth 3 (three) times daily. As needed for muscle spasm   carbidopa-levodopa 25-100 MG tablet Commonly known as: SINEMET IR  Take 1 tablet by mouth in the morning and at bedtime.   donepezil 10 MG tablet Commonly known as: ARICEPT Take 10 mg by mouth daily.   FISH OIL PO Take 2 capsules by mouth daily.   fluconazole 150 MG tablet Commonly known as: DIFLUCAN Take 150 mg by mouth every 3 (three) days.   FLUoxetine 40 MG capsule Commonly known as: PROZAC Take 1 capsule (40 mg total) by mouth daily. What changed: how much to take   gabapentin 300 MG capsule Commonly known as: NEURONTIN Take 300-600 mg by mouth See admin instructions. Take 300 mg in the morning and 600 mg at night   lamoTRIgine 150 MG tablet Commonly known as: LAMICTAL Take 1 tablet by mouth 2 (two) times daily.   LEQEMBI IV Inject into the vein every 14 (fourteen) days.   levETIRAcetam 750 MG tablet Commonly known as: KEPPRA Take 750 mg by mouth 2 (two) times daily.   levocetirizine 5 MG tablet Commonly known as: XYZAL Take 1 tablet (5 mg total) by mouth every evening.   meclizine 25 MG tablet Commonly known as:  ANTIVERT Take 25 mg by mouth 3 (three) times daily as needed for dizziness.   memantine 10 MG tablet Commonly known as: NAMENDA Take 10 mg by mouth 2 (two) times daily.   multivitamin with minerals tablet Take 1 tablet by mouth at bedtime.   nitroGLYCERIN 0.4 MG SL tablet Commonly known as: NITROSTAT Place 1 tablet (0.4 mg total) under the tongue every 5 (five) minutes as needed for chest pain.   omeprazole 40 MG capsule Commonly known as: PRILOSEC Take 1 capsule (40 mg total) by mouth daily.   ondansetron 8 MG tablet Commonly known as: ZOFRAN Take 1 tablet (8 mg total) by mouth every 8 (eight) hours as needed for nausea or vomiting.   oxybutynin 5 MG tablet Commonly known as: DITROPAN Take 5 mg by mouth 2 (two) times daily.   oxyCODONE 5 MG immediate release tablet Commonly known as: Oxy IR/ROXICODONE Take 5 mg by mouth every 4 (four) hours as needed for severe pain (pain score 7-10).   polyethylene glycol 17 g packet Commonly known as: MIRALAX / GLYCOLAX Take 17 g by mouth 2 (two) times daily. What changed:  when to take this reasons to take this   predniSONE 5 MG tablet Commonly known as: DELTASONE TAKE 1 TABLET(5 MG) BY MOUTH DAILY WITH BREAKFAST   primidone 50 MG tablet Commonly known as: MYSOLINE Take 100 mg by mouth 2 (two) times daily.   rizatriptan 10 MG tablet Commonly known as: MAXALT Take 10 mg by mouth as needed for migraine. May repeat in 2 hours if needed   rosuvastatin 10 MG tablet Commonly known as: CRESTOR Take 1 tablet (10 mg total) by mouth daily.   sennosides-docusate sodium 8.6-50 MG tablet Commonly known as: SENOKOT-S Take 2 tablets by mouth daily.   STOOL SOFTENER PO Take 1 tablet by mouth in the morning and at bedtime.   traZODone 100 MG tablet Commonly known as: DESYREL Take 2 tablets (200 mg total) by mouth at bedtime.        Follow-up Information     [place] Follow up.   Why: [facility] will continue to provide home health services. Contact information: [address] [place] [address] [phone]                Allergies  Allergen Reactions   Carbamazepine Other (See  Comments)    Parkinsons like symptoms tremors   Sertraline Hcl Other (See Comments)    Parkinsons like symptoms    Consultations: Orthopedic surgery  Procedures:   Discharge Exam: BP (!) 103/47 (BP Location: Left Arm)   Pulse 65   Temp 98.5 F (36.9 C) (Oral)   Resp 18   Ht 5\' 2"  (1.575 m)   Wt 88 kg   SpO2 93%   BMI 35.48 kg/m  Physical Exam Constitutional:      Appearance: Normal appearance.  HENT:     Head: Normocephalic and atraumatic.     Mouth/Throat:     Mouth: Mucous membranes are moist.  Eyes:     Extraocular Movements: Extraocular movements intact.  Cardiovascular:     Rate and Rhythm: Normal rate and regular rhythm.  Pulmonary:     Effort: Pulmonary effort is normal. No respiratory distress.     Breath sounds: Normal breath sounds. No wheezing.  Abdominal:     General: Bowel sounds are normal. There is no distension.     Palpations: Abdomen is soft.     Tenderness: There is no abdominal tenderness.  Musculoskeletal:        General: Signs of injury (left foot noted with tender 1st-3rd toes with assoc bruising) present.     Cervical back: Normal range of motion and neck supple.     Right lower leg: Edema (1-2+ around kee s/p surgery) present.     Comments: Right knee surgical dressing in place  Skin:    General: Skin is warm and dry.  Neurological:     General: No focal deficit present.     Mental Status: She is alert.  Psychiatric:        Mood and Affect: Mood normal.      The results of significant diagnostics from this hospitalization (including imaging, microbiology, ancillary and laboratory) are listed below for reference.   Microbiology: Recent Results (from the past 240 hours)  Resp panel by RT-PCR (RSV, Flu A&B, Covid)  Anterior Nasal Swab     Status: None   Collection Time: 10/21/23  7:21 AM   Specimen: Anterior Nasal Swab  Result Value Ref Range Status   SARS Coronavirus 2 by RT PCR NEGATIVE NEGATIVE Final   Influenza A by PCR NEGATIVE NEGATIVE Final   Influenza B by PCR NEGATIVE NEGATIVE Final    Comment: (NOTE) The Xpert Xpress SARS-CoV-2/FLU/RSV plus assay is intended as an aid in the diagnosis of influenza from Nasopharyngeal swab specimens and should not be used as a sole basis for treatment. Nasal washings and aspirates are unacceptable for Xpert Xpress SARS-CoV-2/FLU/RSV testing.  Fact Sheet for Patients: BloggerCourse.com  Fact Sheet for Healthcare Providers: SeriousBroker.it  This test is not yet approved or cleared by the [place] FDA and has been authorized for detection and/or diagnosis of SARS-CoV-2 by FDA under an Emergency Use Authorization (EUA). This EUA will remain in effect (meaning this test can be used) for the duration of the COVID-19 declaration under Section 564(b)(1) of the Act, 21 U.S.C. section 360bbb-3(b)(1), unless the authorization is terminated or revoked.     Resp Syncytial Virus by PCR NEGATIVE NEGATIVE Final    Comment: (NOTE) Fact Sheet for Patients: BloggerCourse.com  Fact Sheet for Healthcare Providers: SeriousBroker.it  This test is not yet approved or cleared by the [place] FDA and has been authorized for detection and/or diagnosis of SARS-CoV-2 by FDA under an Emergency Use Authorization (EUA). This EUA will remain in effect (  meaning this test can be used) for the duration of the COVID-19 declaration under Section 564(b)(1) of the Act, 21 U.S.C. section 360bbb-3(b)(1), unless the authorization is terminated or revoked.  Performed at [facility], [address], [place], [place] [address]   Urine Culture     Status: Abnormal    Collection Time: 10/21/23  1:00 PM   Specimen: Urine, Clean Catch  Result Value Ref Range Status   Specimen Description URINE, CLEAN CATCH  Final   Special Requests   Final    NONE Performed at [facility], [address], [place], [place] [address]    Culture (A)  Final    >=100,000 COLONIES/mL ESCHERICHIA COLI Confirmed Extended Spectrum Beta-Lactamase Producer (ESBL).  In bloodstream infections from ESBL organisms, carbapenems are preferred over piperacillin/tazobactam. They are shown to have a lower risk of mortality.    Report Status 10/23/2023 FINAL  Final   Organism ID, Bacteria ESCHERICHIA COLI (A)  Final      Susceptibility   Escherichia coli - MIC*    AMPICILLIN >=32 RESISTANT Resistant     CEFAZOLIN >=64 RESISTANT Resistant     CEFEPIME 16 RESISTANT Resistant     CEFTRIAXONE >=64 RESISTANT Resistant     CIPROFLOXACIN >=4 RESISTANT Resistant     GENTAMICIN <=1 SENSITIVE Sensitive     IMIPENEM <=0.25 SENSITIVE Sensitive     NITROFURANTOIN <=16 SENSITIVE Sensitive     TRIMETH/SULFA >=320 RESISTANT Resistant     AMPICILLIN/SULBACTAM 8 SENSITIVE Sensitive     PIP/TAZO <=4 SENSITIVE Sensitive ug/mL    * >=100,000 COLONIES/mL ESCHERICHIA COLI  Respiratory (~20 pathogens) panel by PCR     Status: None   Collection Time: 10/21/23  1:30 PM   Specimen: Nasopharyngeal Swab; Respiratory  Result Value Ref Range Status   Adenovirus NOT DETECTED NOT DETECTED Final   Coronavirus 229E NOT DETECTED NOT DETECTED Final    Comment: (NOTE) The Coronavirus on the Respiratory Panel, DOES NOT test for the novel  Coronavirus (2019 nCoV)    Coronavirus HKU1 NOT DETECTED NOT DETECTED Final   Coronavirus NL63 NOT DETECTED NOT DETECTED Final   Coronavirus OC43 NOT DETECTED NOT DETECTED Final   Metapneumovirus NOT DETECTED NOT DETECTED Final   Rhinovirus / Enterovirus NOT DETECTED NOT DETECTED Final   Influenza A NOT DETECTED NOT DETECTED Final   Influenza B NOT DETECTED NOT DETECTED  Final   Parainfluenza Virus 1 NOT DETECTED NOT DETECTED Final   Parainfluenza Virus 2 NOT DETECTED NOT DETECTED Final   Parainfluenza Virus 3 NOT DETECTED NOT DETECTED Final   Parainfluenza Virus 4 NOT DETECTED NOT DETECTED Final   Respiratory Syncytial Virus NOT DETECTED NOT DETECTED Final   Bordetella pertussis NOT DETECTED NOT DETECTED Final   Bordetella Parapertussis NOT DETECTED NOT DETECTED Final   Chlamydophila pneumoniae NOT DETECTED NOT DETECTED Final   Mycoplasma pneumoniae NOT DETECTED NOT DETECTED Final    Comment: Performed at [facility], [address], [place], [place] [address]     Labs: BNP (last 3 results) No results for input(s): "BNP" in the last 8760 hours. Basic Metabolic Panel: Recent Labs  Lab 10/21/23 0723 10/22/23 0739  NA 140 141  K 4.2 4.3  CL 102 105  CO2 24 27  GLUCOSE 134* 93  BUN 21 20  CREATININE 1.40* 1.35*  CALCIUM 8.9 8.1*   Liver Function Tests: Recent Labs  Lab 10/21/23 0723 10/22/23 0739  AST 20 16  ALT 6 5  ALKPHOS 72 55  BILITOT 0.5 0.6  PROT 6.4* 5.4*  ALBUMIN 3.4* 2.7*   No results for input(s): "LIPASE", "AMYLASE" in the last 168 hours. Recent Labs  Lab 10/21/23 0938  AMMONIA 29   CBC: Recent Labs  Lab 10/21/23 0723 10/22/23 0739  WBC 9.3 9.0  NEUTROABS 7.2  --   HGB 13.4 10.8*  HCT 40.2 33.5*  MCV 96.9 97.7  PLT 106* 160   Cardiac Enzymes: No results for input(s): "CKTOTAL", "CKMB", "CKMBINDEX", "TROPONINI" in the last 168 hours. BNP: Invalid input(s): "POCBNP" CBG: Recent Labs  Lab 10/22/23 1744 10/23/23 0750 10/23/23 1135 10/23/23 1604 10/24/23 0728  GLUCAP 118* 110* 193* 89 104*   D-Dimer No results for input(s): "DDIMER" in the last 72 hours. Hgb A1c Recent Labs    10/21/23 2253  HGBA1C 5.3   Lipid Profile No results for input(s): "CHOL", "HDL", "LDLCALC", "TRIG", "CHOLHDL", "LDLDIRECT" in the last 72 hours. Thyroid function studies No results for input(s): "TSH", "T4TOTAL",  "T3FREE", "THYROIDAB" in the last 72 hours.  Invalid input(s): "FREET3" Anemia work up No results for input(s): "VITAMINB12", "FOLATE", "FERRITIN", "TIBC", "IRON", "RETICCTPCT" in the last 72 hours. Urinalysis    Component Value Date/Time   COLORURINE YELLOW 10/21/2023 1130   APPEARANCEUR CLEAR 10/21/2023 1130   LABSPEC 1.008 10/21/2023 1130   PHURINE 5.0 10/21/2023 1130   GLUCOSEU NEGATIVE 10/21/2023 1130   HGBUR NEGATIVE 10/21/2023 1130   BILIRUBINUR NEGATIVE 10/21/2023 1130   KETONESUR NEGATIVE 10/21/2023 1130   PROTEINUR NEGATIVE 10/21/2023 1130   NITRITE POSITIVE (A) 10/21/2023 1130   LEUKOCYTESUR SMALL (A) 10/21/2023 1130   Sepsis Labs Recent Labs  Lab 10/21/23 0723 10/22/23 0739  WBC 9.3 9.0   Microbiology Recent Results (from the past 240 hours)  Resp panel by RT-PCR (RSV, Flu A&B, Covid) Anterior Nasal Swab     Status: None   Collection Time: 10/21/23  7:21 AM   Specimen: Anterior Nasal Swab  Result Value Ref Range Status   SARS Coronavirus 2 by RT PCR NEGATIVE NEGATIVE Final   Influenza A by PCR NEGATIVE NEGATIVE Final   Influenza B by PCR NEGATIVE NEGATIVE Final    Comment: (NOTE) The Xpert Xpress SARS-CoV-2/FLU/RSV plus assay is intended as an aid in the diagnosis of influenza from Nasopharyngeal swab specimens and should not be used as a sole basis for treatment. Nasal washings and aspirates are unacceptable for Xpert Xpress SARS-CoV-2/FLU/RSV testing.  Fact Sheet for Patients: BloggerCourse.com  Fact Sheet for Healthcare Providers: SeriousBroker.it  This test is not yet approved or cleared by the [place] FDA and has been authorized for detection and/or diagnosis of SARS-CoV-2 by FDA under an Emergency Use Authorization (EUA). This EUA will remain in effect (meaning this test can be used) for the duration of the COVID-19 declaration under Section 564(b)(1) of the Act, 21 U.S.C. section  360bbb-3(b)(1), unless the authorization is terminated or revoked.     Resp Syncytial Virus by PCR NEGATIVE NEGATIVE Final    Comment: (NOTE) Fact Sheet for Patients: BloggerCourse.com  Fact Sheet for Healthcare Providers: SeriousBroker.it  This test is not yet approved or cleared by the [place] FDA and has been authorized for detection and/or diagnosis of SARS-CoV-2 by FDA under an Emergency Use Authorization (EUA). This EUA will remain in effect (meaning this test can be used) for the duration of the COVID-19 declaration under Section 564(b)(1) of the Act, 21 U.S.C. section 360bbb-3(b)(1), unless the authorization is terminated or revoked.  Performed at [facility]  [facility], [address] [place], [place] [address]   Urine Culture     Status: Abnormal   Collection Time: 10/21/23  1:00 PM   Specimen: Urine, Clean Catch  Result Value Ref Range Status   Specimen Description URINE, CLEAN CATCH  Final   Special Requests   Final    NONE Performed at [facility], [address] [place], [place] [address]    Culture (A)  Final    >=100,000 COLONIES/mL ESCHERICHIA COLI Confirmed Extended Spectrum Beta-Lactamase Producer (ESBL).  In bloodstream infections from ESBL organisms, carbapenems are preferred over piperacillin/tazobactam. They are shown to have a lower risk of mortality.    Report Status 10/23/2023 FINAL  Final   Organism ID, Bacteria ESCHERICHIA COLI (A)  Final      Susceptibility   Escherichia coli - MIC*    AMPICILLIN >=32 RESISTANT Resistant     CEFAZOLIN >=64 RESISTANT Resistant     CEFEPIME 16 RESISTANT Resistant     CEFTRIAXONE >=64 RESISTANT Resistant     CIPROFLOXACIN >=4 RESISTANT Resistant     GENTAMICIN <=1 SENSITIVE Sensitive     IMIPENEM <=0.25 SENSITIVE Sensitive     NITROFURANTOIN <=16 SENSITIVE Sensitive     TRIMETH/SULFA >=320 RESISTANT Resistant     AMPICILLIN/SULBACTAM 8 SENSITIVE  Sensitive     PIP/TAZO <=4 SENSITIVE Sensitive ug/mL    * >=100,000 COLONIES/mL ESCHERICHIA COLI  Respiratory (~20 pathogens) panel by PCR     Status: None   Collection Time: 10/21/23  1:30 PM   Specimen: Nasopharyngeal Swab; Respiratory  Result Value Ref Range Status   Adenovirus NOT DETECTED NOT DETECTED Final   Coronavirus 229E NOT DETECTED NOT DETECTED Final    Comment: (NOTE) The Coronavirus on the Respiratory Panel, DOES NOT test for the novel  Coronavirus (2019 nCoV)    Coronavirus HKU1 NOT DETECTED NOT DETECTED Final   Coronavirus NL63 NOT DETECTED NOT DETECTED Final   Coronavirus OC43 NOT DETECTED NOT DETECTED Final   Metapneumovirus NOT DETECTED NOT DETECTED Final   Rhinovirus / Enterovirus NOT DETECTED NOT DETECTED Final   Influenza A NOT DETECTED NOT DETECTED Final   Influenza B NOT DETECTED NOT DETECTED Final   Parainfluenza Virus 1 NOT DETECTED NOT DETECTED Final   Parainfluenza Virus 2 NOT DETECTED NOT DETECTED Final   Parainfluenza Virus 3 NOT DETECTED NOT DETECTED Final   Parainfluenza Virus 4 NOT DETECTED NOT DETECTED Final   Respiratory Syncytial Virus NOT DETECTED NOT DETECTED Final   Bordetella pertussis NOT DETECTED NOT DETECTED Final   Bordetella Parapertussis NOT DETECTED NOT DETECTED Final   Chlamydophila pneumoniae NOT DETECTED NOT DETECTED Final   Mycoplasma pneumoniae NOT DETECTED NOT DETECTED Final    Comment: Performed at [facility], [address] [place], [place] [address]    Procedures/Studies: DG Foot Complete Left Result Date: 10/22/2023 CLINICAL DATA:  Foot pain post fall EXAM: LEFT FOOT - COMPLETE 3+ VIEW COMPARISON:  None Available. FINDINGS: Small plantar calcaneal spur. No malalignment. Acute minimally displaced fracture involving the mid and proximal second proximal phalanx with articular surface extension to the MTP joint. Additional acute minimally displaced intra-articular fracture involving base of third proximal phalanx.  Possible additional small fractures at the medial and lateral base of the first proximal phalanx. There is acute mildly displaced intra-articular fracture involving the dorsal base of the first distal phalanx on lateral view as well. Mild degenerative change at the first MTP joint IMPRESSION: 1. Acute mildly displaced fractures involving the  second and third proximal phalanges as described above. 2. Possible additional small fractures at the base of the first proximal phalanx. Acute mildly displaced intra-articular fracture dorsal base of first distal phalanx. These results will be called to the ordering clinician or representative by the Radiologist Assistant, and communication documented in the PACS or [employer]. Electronically Signed   By: Jasmine Pang M.D.   On: 10/22/2023 19:40   DG Knee Right Port Result Date: 10/21/2023 CLINICAL DATA:  Postop right knee arthroplasty EXAM: PORTABLE RIGHT KNEE - 1-2 VIEW COMPARISON:  10/19/2023 FINDINGS: Frontal and cross-table lateral views of the right knee are obtained. Three component right knee arthroplasty is in stable position without evidence of acute complication. Small joint effusion. Diffuse subcutaneous edema, increased since prior study. IMPRESSION: 1. Unremarkable 3 component right knee arthroplasty. 2. Small joint effusion. 3. Diffuse subcutaneous edema, increased since prior study. Electronically Signed   By: Sharlet Salina M.D.   On: 10/21/2023 19:13   DG Chest Portable 1 View Result Date: 10/21/2023 CLINICAL DATA:  63 year old female with hypoxia. EXAM: PORTABLE CHEST 1 VIEW COMPARISON:  CT Chest, Abdomen, and Pelvis 09/21/2022 and earlier. FINDINGS: Portable AP semi upright view at 0754 hours. Lower lung volumes compared to last year. Stable cardiac size and mediastinal contours. Visualized tracheal air column is within normal limits. Allowing for portable technique the lungs are clear. No pneumothorax or pleural effusion. Stable cholecystectomy  clips. Negative visible bowel gas. No acute osseous abnormality identified. IMPRESSION: Low lung volumes, otherwise no acute cardiopulmonary abnormality. Electronically Signed   By: Odessa Fleming M.D.   On: 10/21/2023 11:32   CT Chest Wo Contrast Result Date: 10/21/2023 CLINICAL DATA:  Hypoxia, concern for aspiration EXAM: CT CHEST WITHOUT CONTRAST TECHNIQUE: Multidetector CT imaging of the chest was performed following the standard protocol without IV contrast. RADIATION DOSE REDUCTION: This exam was performed according to the departmental dose-optimization program which includes automated exposure control, adjustment of the mA and/or kV according to patient size and/or use of iterative reconstruction technique. COMPARISON:  07/23/2023 FINDINGS: Cardiovascular: Heart size is upper limits of normal. No pericardial effusion. Thoracic aorta is not aneurysmal. Scattered atherosclerotic vascular calcifications of the aorta and coronary arteries. Central pulmonary vasculature within normal limits. Mediastinum/Nodes: No enlarged mediastinal or axillary lymph nodes. Thyroid gland, trachea, and esophagus demonstrate no significant findings. Tiny hiatal hernia. Lungs/Pleura: Trace pleural fluid present on the right. Mild mosaic attenuation. Areas of chronic linear scarring bilaterally. Otherwise, no focal airspace consolidation. No pneumothorax. Upper Abdomen: No acute abnormality. Musculoskeletal: Subacute nondisplaced fracture of the posterior right tenth rib. Chronic fracture of the posterior right fourth rib. Chronic mild superior endplate compression deformities of T9 and T11. No acute osseous abnormality. No significant chest wall abnormality. IMPRESSION: 1. Trace right pleural effusion. 2. Mild mosaic attenuation of the lungs, which can be seen in the setting of small airways disease. 3. Subacute nondisplaced fracture of the posterior right tenth rib. 4. Aortic and coronary artery atherosclerosis (ICD10-I70.0).  Electronically Signed   By: Duanne Guess D.O.   On: 10/21/2023 11:01   CT Head Wo Contrast Result Date: 10/21/2023 CLINICAL DATA:  Mental status change, unknown cause head trauma, mental status change; Polytrauma, blunt EXAM: CT HEAD WITHOUT CONTRAST CT CERVICAL SPINE WITHOUT CONTRAST TECHNIQUE: Multidetector CT imaging of the head and cervical spine was performed following the standard protocol without intravenous contrast. Multiplanar CT image reconstructions of the cervical spine were also generated. RADIATION DOSE REDUCTION: This exam was performed according to the  departmental dose-optimization program which includes automated exposure control, adjustment of the mA and/or kV according to patient size and/or use of iterative reconstruction technique. COMPARISON:  MRI head 07/27/2023, CT head 07/23/2023 FINDINGS: CT HEAD FINDINGS Brain: No evidence of large-territorial acute infarction. No parenchymal hemorrhage. No mass lesion. No extra-axial collection. No mass effect or midline shift. No hydrocephalus. Basilar cisterns are patent. Empty sella. Vascular: No hyperdense vessel. Skull: No acute fracture or focal lesion. Sinuses/Orbits: Paranasal sinuses and mastoid air cells are clear. Persistent prominent bilateral optic nerves with query underlying edema. The orbits are unremarkable. Other: None. CT CERVICAL SPINE FINDINGS Alignment: Reversal of the normal cervical lordosis centered at the C5 level likely due to positioning and degenerative changes. Skull base and vertebrae: Multilevel moderate degenerative changes of the spine most prominent at the C5-C6 level. No acute fracture. No aggressive appearing focal osseous lesion or focal pathologic process. Soft tissues and spinal canal: No prevertebral fluid or swelling. No visible canal hematoma. Upper chest: Unremarkable. Other: None. IMPRESSION: 1. Persistent prominent bilateral optic nerves with query underlying edema. Recommend outpatient ophthalmologic  follow-up. 2. Otherwise no acute intracranial abnormality. 3. No acute displaced fracture or traumatic listhesis of the cervical spine. 4. Empty sella. Findings is often a normal anatomic variant but can be associated with idiopathic intracranial hypertension (pseudotumor cerebri). Electronically Signed   By: Tish Frederickson M.D.   On: 10/21/2023 08:25   CT Cervical Spine Wo Contrast Result Date: 10/21/2023 CLINICAL DATA:  Mental status change, unknown cause head trauma, mental status change; Polytrauma, blunt EXAM: CT HEAD WITHOUT CONTRAST CT CERVICAL SPINE WITHOUT CONTRAST TECHNIQUE: Multidetector CT imaging of the head and cervical spine was performed following the standard protocol without intravenous contrast. Multiplanar CT image reconstructions of the cervical spine were also generated. RADIATION DOSE REDUCTION: This exam was performed according to the departmental dose-optimization program which includes automated exposure control, adjustment of the mA and/or kV according to patient size and/or use of iterative reconstruction technique. COMPARISON:  MRI head 07/27/2023, CT head 07/23/2023 FINDINGS: CT HEAD FINDINGS Brain: No evidence of large-territorial acute infarction. No parenchymal hemorrhage. No mass lesion. No extra-axial collection. No mass effect or midline shift. No hydrocephalus. Basilar cisterns are patent. Empty sella. Vascular: No hyperdense vessel. Skull: No acute fracture or focal lesion. Sinuses/Orbits: Paranasal sinuses and mastoid air cells are clear. Persistent prominent bilateral optic nerves with query underlying edema. The orbits are unremarkable. Other: None. CT CERVICAL SPINE FINDINGS Alignment: Reversal of the normal cervical lordosis centered at the C5 level likely due to positioning and degenerative changes. Skull base and vertebrae: Multilevel moderate degenerative changes of the spine most prominent at the C5-C6 level. No acute fracture. No aggressive appearing focal osseous  lesion or focal pathologic process. Soft tissues and spinal canal: No prevertebral fluid or swelling. No visible canal hematoma. Upper chest: Unremarkable. Other: None. IMPRESSION: 1. Persistent prominent bilateral optic nerves with query underlying edema. Recommend outpatient ophthalmologic follow-up. 2. Otherwise no acute intracranial abnormality. 3. No acute displaced fracture or traumatic listhesis of the cervical spine. 4. Empty sella. Findings is often a normal anatomic variant but can be associated with idiopathic intracranial hypertension (pseudotumor cerebri). Electronically Signed   By: Tish Frederickson M.D.   On: 10/21/2023 08:25   DG Knee Right Port Result Date: 10/19/2023 CLINICAL DATA:  Status post total knee replacement, right. EXAM: PORTABLE RIGHT KNEE - 1-2 VIEW COMPARISON:  07/23/2023. FINDINGS: Total knee arthroplasty changes are noted. Hardware alignment appears anatomic. No acute fracture or  dislocation is seen. Air is present in the joint capsule and soft tissues. IMPRESSION: Status post total knee arthroplasty. Electronically Signed   By: Thornell Sartorius M.D.   On: 10/19/2023 14:22     Time coordinating discharge: Over 30 minutes    Lewie Chamber, MD  [facility] 10/24/2023, 2:17 PM

## 2023-10-24 NOTE — Progress Notes (Signed)
     Subjective: Valerie [name] is doing well today. Worked well with PT and in good spirits.  Found to have fxs on left foot 2nd and 3rd toes. Did not use CPM yseterday but wants to use it today. No new concerns.. She is hopeful to go home.   Objective:   VITALS:   Vitals:   10/23/23 0807 10/23/23 1640 10/23/23 2222 10/24/23 0410  BP: 108/63 (!) 90/55  (!) 101/53  Pulse: 97 65  66  Resp: 18 18  17   Temp: 99.8 F (37.7 C) 98.9 F (37.2 C) 98.9 F (37.2 C) 98.7 F (37.1 C)  TempSrc: Oral Oral Oral   SpO2: 93% 96%  95%  Weight:      Height:       Right knee dressing clean, dry, and intact, mild.   No traumatic wounds, ecchymosis, or rash  Sens DPN, SPN, TN intact  Motor EHL, ext, flex 5/5  DP 2+, PT 2+, No significant edema    Lab Results  Component Value Date   WBC 9.0 10/22/2023   HGB 10.8 (L) 10/22/2023   HCT 33.5 (L) 10/22/2023   MCV 97.7 10/22/2023   PLT 160 10/22/2023   BMET    Component Value Date/Time   NA 141 10/22/2023 0739   K 4.3 10/22/2023 0739   CL 105 10/22/2023 0739   CO2 27 10/22/2023 0739   GLUCOSE 93 10/22/2023 0739   BUN 20 10/22/2023 0739   CREATININE 1.35 (H) 10/22/2023 0739   CREATININE 1.73 (H) 02/08/2018 1020   CALCIUM 8.1 (L) 10/22/2023 0739   CALCIUM 12.0 (H) 08/09/2020 1729   GFRNONAA 44 (L) 10/22/2023 0739   GFRNONAA 32 (L) 02/08/2018 1020   Xray 2/27 TKA components in good alignment, no fracture  Assessment/Plan:     Principal Problem:   Acute metabolic encephalopathy Active Problems:   Anxiety   Morbid obesity (HCC)   Chronic kidney disease, stage 3b (HCC)   Psoriatic arthritis (HCC)   OSA (obstructive sleep apnea)   Neuropathy   Bipolar disorder (HCC)   DM type 2 (diabetes mellitus, type 2) (HCC)   CAD (coronary artery disease)   Essential hypertension   GERD (gastroesophageal reflux disease)   Hyperlipidemia associated with type 2 diabetes mellitus (HCC)   Fall at home, initial encounter   Seizure disorder  (HCC)   Parkinson disease (HCC)   UTI (urinary tract infection)   Doing much better today. Keep dressing clean/dry/ and intact WBAT RLE - awaiting physical therapy CPM machine ordered to use daily for 2 hours  Left foot 2nd and 3rd toe fractures - WBAT in hard sole shoe/ post op shoe   Valerie [name] 10/24/2023, 7:06 AM   Weber Cooks, MD  Contact information:   [email or web address] 7am-5pm epic message Dr. Blanchie Dessert, or call office for patient follow up: [phone] After hours and holidays please check [email or web address] for group call information for [facility]

## 2023-10-24 NOTE — Progress Notes (Signed)
 Mobility Specialist: Progress Note   10/24/23 1227  Mobility  Activity Transferred from bed to chair  Level of Assistance Contact guard assist, steadying assist  Assistive Device Front wheel walker  Activity Response Tolerated well  Mobility Referral Yes  Mobility visit 1 Mobility  Mobility Specialist Start Time (ACUTE ONLY) 0910  Mobility Specialist Stop Time (ACUTE ONLY) [id number]  Mobility Specialist Time Calculation (min) (ACUTE ONLY) 18 min    Pt requested to be removed from CPM machine and requested ice for knee - received in bed and agreeable to additional mobility. Soon deferred ambulation d/t 10/10 pain and needing pain medicine. Still wanting to sit in the chair. MS donned L post op shoe. Completed stand pivot to chair with MinG. Left in chair with all needs met, call bell in reach.    Maurene Capes Mobility Specialist Please contact via SecureChat or Rehab office at [phone]

## 2024-01-02 ENCOUNTER — Other Ambulatory Visit: Payer: Self-pay

## 2024-01-02 ENCOUNTER — Inpatient Hospital Stay (HOSPITAL_COMMUNITY)
Admission: EM | Admit: 2024-01-02 | Discharge: 2024-01-10 | DRG: 481 | Disposition: A | Discharge status: Skilled Nursing Facility | Attending: Internal Medicine | Admitting: Internal Medicine

## 2024-01-02 ENCOUNTER — Encounter (HOSPITAL_COMMUNITY): Payer: Self-pay

## 2024-01-02 ENCOUNTER — Emergency Department (HOSPITAL_COMMUNITY)

## 2024-01-02 DIAGNOSIS — D84821 Immunodeficiency due to drugs: Secondary | ICD-10-CM | POA: Diagnosis present

## 2024-01-02 DIAGNOSIS — E1122 Type 2 diabetes mellitus with diabetic chronic kidney disease: Secondary | ICD-10-CM | POA: Diagnosis present

## 2024-01-02 DIAGNOSIS — Z6838 Body mass index (BMI) 38.0-38.9, adult: Secondary | ICD-10-CM

## 2024-01-02 DIAGNOSIS — D62 Acute posthemorrhagic anemia: Secondary | ICD-10-CM

## 2024-01-02 DIAGNOSIS — Z90710 Acquired absence of both cervix and uterus: Secondary | ICD-10-CM

## 2024-01-02 DIAGNOSIS — I2510 Atherosclerotic heart disease of native coronary artery without angina pectoris: Secondary | ICD-10-CM | POA: Diagnosis present

## 2024-01-02 DIAGNOSIS — F17210 Nicotine dependence, cigarettes, uncomplicated: Secondary | ICD-10-CM | POA: Diagnosis present

## 2024-01-02 DIAGNOSIS — S72451A Displaced supracondylar fracture without intracondylar extension of lower end of right femur, initial encounter for closed fracture: Principal | ICD-10-CM | POA: Diagnosis present

## 2024-01-02 DIAGNOSIS — S72401A Unspecified fracture of lower end of right femur, initial encounter for closed fracture: Secondary | ICD-10-CM

## 2024-01-02 DIAGNOSIS — E1142 Type 2 diabetes mellitus with diabetic polyneuropathy: Secondary | ICD-10-CM | POA: Diagnosis present

## 2024-01-02 DIAGNOSIS — W1809XA Striking against other object with subsequent fall, initial encounter: Secondary | ICD-10-CM | POA: Diagnosis present

## 2024-01-02 DIAGNOSIS — Z79899 Other long term (current) drug therapy: Secondary | ICD-10-CM

## 2024-01-02 DIAGNOSIS — E1169 Type 2 diabetes mellitus with other specified complication: Secondary | ICD-10-CM | POA: Diagnosis present

## 2024-01-02 DIAGNOSIS — I6930 Unspecified sequelae of cerebral infarction: Secondary | ICD-10-CM | POA: Diagnosis not present

## 2024-01-02 DIAGNOSIS — M1711 Unilateral primary osteoarthritis, right knee: Secondary | ICD-10-CM | POA: Diagnosis present

## 2024-01-02 DIAGNOSIS — Z604 Social exclusion and rejection: Secondary | ICD-10-CM | POA: Diagnosis present

## 2024-01-02 DIAGNOSIS — G40909 Epilepsy, unspecified, not intractable, without status epilepticus: Secondary | ICD-10-CM | POA: Diagnosis present

## 2024-01-02 DIAGNOSIS — E785 Hyperlipidemia, unspecified: Secondary | ICD-10-CM | POA: Diagnosis present

## 2024-01-02 DIAGNOSIS — I959 Hypotension, unspecified: Secondary | ICD-10-CM | POA: Diagnosis not present

## 2024-01-02 DIAGNOSIS — E0590 Thyrotoxicosis, unspecified without thyrotoxic crisis or storm: Secondary | ICD-10-CM | POA: Diagnosis present

## 2024-01-02 DIAGNOSIS — E039 Hypothyroidism, unspecified: Secondary | ICD-10-CM | POA: Diagnosis present

## 2024-01-02 DIAGNOSIS — I129 Hypertensive chronic kidney disease with stage 1 through stage 4 chronic kidney disease, or unspecified chronic kidney disease: Secondary | ICD-10-CM | POA: Diagnosis present

## 2024-01-02 DIAGNOSIS — I10 Essential (primary) hypertension: Secondary | ICD-10-CM | POA: Diagnosis not present

## 2024-01-02 DIAGNOSIS — G20A1 Parkinson's disease without dyskinesia, without mention of fluctuations: Secondary | ICD-10-CM | POA: Diagnosis present

## 2024-01-02 DIAGNOSIS — N390 Urinary tract infection, site not specified: Secondary | ICD-10-CM | POA: Diagnosis present

## 2024-01-02 DIAGNOSIS — K5641 Fecal impaction: Secondary | ICD-10-CM | POA: Diagnosis not present

## 2024-01-02 DIAGNOSIS — F17290 Nicotine dependence, other tobacco product, uncomplicated: Secondary | ICD-10-CM | POA: Diagnosis present

## 2024-01-02 DIAGNOSIS — E119 Type 2 diabetes mellitus without complications: Secondary | ICD-10-CM

## 2024-01-02 DIAGNOSIS — Z7982 Long term (current) use of aspirin: Secondary | ICD-10-CM

## 2024-01-02 DIAGNOSIS — N179 Acute kidney failure, unspecified: Secondary | ICD-10-CM | POA: Diagnosis present

## 2024-01-02 DIAGNOSIS — Z713 Dietary counseling and surveillance: Secondary | ICD-10-CM

## 2024-01-02 DIAGNOSIS — M9711XA Periprosthetic fracture around internal prosthetic right knee joint, initial encounter: Secondary | ICD-10-CM | POA: Diagnosis present

## 2024-01-02 DIAGNOSIS — W19XXXA Unspecified fall, initial encounter: Principal | ICD-10-CM | POA: Diagnosis present

## 2024-01-02 DIAGNOSIS — Z9089 Acquired absence of other organs: Secondary | ICD-10-CM

## 2024-01-02 DIAGNOSIS — F0284 Dementia in other diseases classified elsewhere, unspecified severity, with anxiety: Secondary | ICD-10-CM | POA: Diagnosis present

## 2024-01-02 DIAGNOSIS — F319 Bipolar disorder, unspecified: Secondary | ICD-10-CM | POA: Diagnosis present

## 2024-01-02 DIAGNOSIS — G4733 Obstructive sleep apnea (adult) (pediatric): Secondary | ICD-10-CM | POA: Diagnosis present

## 2024-01-02 DIAGNOSIS — K219 Gastro-esophageal reflux disease without esophagitis: Secondary | ICD-10-CM | POA: Diagnosis present

## 2024-01-02 DIAGNOSIS — Y9301 Activity, walking, marching and hiking: Secondary | ICD-10-CM | POA: Diagnosis present

## 2024-01-02 DIAGNOSIS — Z8719 Personal history of other diseases of the digestive system: Secondary | ICD-10-CM

## 2024-01-02 DIAGNOSIS — K0889 Other specified disorders of teeth and supporting structures: Secondary | ICD-10-CM | POA: Diagnosis present

## 2024-01-02 DIAGNOSIS — R32 Unspecified urinary incontinence: Secondary | ICD-10-CM | POA: Diagnosis present

## 2024-01-02 DIAGNOSIS — Z9049 Acquired absence of other specified parts of digestive tract: Secondary | ICD-10-CM

## 2024-01-02 DIAGNOSIS — Z960 Presence of urogenital implants: Secondary | ICD-10-CM | POA: Diagnosis present

## 2024-01-02 DIAGNOSIS — F0283 Dementia in other diseases classified elsewhere, unspecified severity, with mood disturbance: Secondary | ICD-10-CM | POA: Diagnosis present

## 2024-01-02 DIAGNOSIS — G309 Alzheimer's disease, unspecified: Secondary | ICD-10-CM | POA: Diagnosis present

## 2024-01-02 DIAGNOSIS — E1140 Type 2 diabetes mellitus with diabetic neuropathy, unspecified: Secondary | ICD-10-CM | POA: Diagnosis not present

## 2024-01-02 DIAGNOSIS — Z9181 History of falling: Secondary | ICD-10-CM

## 2024-01-02 DIAGNOSIS — L4050 Arthropathic psoriasis, unspecified: Secondary | ICD-10-CM | POA: Diagnosis present

## 2024-01-02 DIAGNOSIS — E66812 Obesity, class 2: Secondary | ICD-10-CM | POA: Diagnosis present

## 2024-01-02 DIAGNOSIS — Z96643 Presence of artificial hip joint, bilateral: Secondary | ICD-10-CM | POA: Diagnosis present

## 2024-01-02 DIAGNOSIS — Z6836 Body mass index (BMI) 36.0-36.9, adult: Secondary | ICD-10-CM

## 2024-01-02 DIAGNOSIS — I252 Old myocardial infarction: Secondary | ICD-10-CM

## 2024-01-02 DIAGNOSIS — Z9889 Other specified postprocedural states: Secondary | ICD-10-CM

## 2024-01-02 DIAGNOSIS — N1832 Chronic kidney disease, stage 3b: Secondary | ICD-10-CM | POA: Diagnosis present

## 2024-01-02 DIAGNOSIS — Z888 Allergy status to other drugs, medicaments and biological substances status: Secondary | ICD-10-CM

## 2024-01-02 DIAGNOSIS — E8809 Other disorders of plasma-protein metabolism, not elsewhere classified: Secondary | ICD-10-CM | POA: Diagnosis present

## 2024-01-02 DIAGNOSIS — Z7952 Long term (current) use of systemic steroids: Secondary | ICD-10-CM

## 2024-01-02 DIAGNOSIS — Z87891 Personal history of nicotine dependence: Secondary | ICD-10-CM | POA: Diagnosis not present

## 2024-01-02 DIAGNOSIS — Z8619 Personal history of other infectious and parasitic diseases: Secondary | ICD-10-CM

## 2024-01-02 DIAGNOSIS — E860 Dehydration: Secondary | ICD-10-CM | POA: Diagnosis present

## 2024-01-02 LAB — CBC WITH DIFFERENTIAL/PLATELET
Abs Immature Granulocytes: 0.05 10*3/uL (ref 0.00–0.07)
Basophils Absolute: 0.1 10*3/uL (ref 0.0–0.1)
Basophils Relative: 1 %
Eosinophils Absolute: 0.3 10*3/uL (ref 0.0–0.5)
Eosinophils Relative: 4 %
HCT: 37.8 % (ref 36.0–46.0)
Hemoglobin: 11.3 g/dL — ABNORMAL LOW (ref 12.0–15.0)
Immature Granulocytes: 1 %
Lymphocytes Relative: 9 %
Lymphs Abs: 0.8 10*3/uL (ref 0.7–4.0)
MCH: 29.7 pg (ref 26.0–34.0)
MCHC: 29.9 g/dL — ABNORMAL LOW (ref 30.0–36.0)
MCV: 99.5 fL (ref 80.0–100.0)
Monocytes Absolute: 0.6 10*3/uL (ref 0.1–1.0)
Monocytes Relative: 7 %
Neutro Abs: 7.4 10*3/uL (ref 1.7–7.7)
Neutrophils Relative %: 78 %
Platelets: 237 10*3/uL (ref 150–400)
RBC: 3.8 MIL/uL — ABNORMAL LOW (ref 3.87–5.11)
RDW: 14.1 % (ref 11.5–15.5)
WBC: 9.3 10*3/uL (ref 4.0–10.5)
nRBC: 0 % (ref 0.0–0.2)

## 2024-01-02 LAB — URINALYSIS, ROUTINE W REFLEX MICROSCOPIC
Bilirubin Urine: NEGATIVE
Glucose, UA: NEGATIVE mg/dL
Hgb urine dipstick: NEGATIVE
Ketones, ur: NEGATIVE mg/dL
Nitrite: POSITIVE — AB
Protein, ur: NEGATIVE mg/dL
Specific Gravity, Urine: 1.012 (ref 1.005–1.030)
pH: 5 (ref 5.0–8.0)

## 2024-01-02 LAB — MRSA NEXT GEN BY PCR, NASAL: MRSA by PCR Next Gen: NOT DETECTED

## 2024-01-02 LAB — COMPREHENSIVE METABOLIC PANEL WITH GFR
ALT: 6 U/L (ref 0–44)
AST: 23 U/L (ref 15–41)
Albumin: 3.6 g/dL (ref 3.5–5.0)
Alkaline Phosphatase: 91 U/L (ref 38–126)
Anion gap: 16 — ABNORMAL HIGH (ref 5–15)
BUN: 26 mg/dL — ABNORMAL HIGH (ref 8–23)
CO2: 19 mmol/L — ABNORMAL LOW (ref 22–32)
Calcium: 8.6 mg/dL — ABNORMAL LOW (ref 8.9–10.3)
Chloride: 104 mmol/L (ref 98–111)
Creatinine, Ser: 1.68 mg/dL — ABNORMAL HIGH (ref 0.44–1.00)
GFR, Estimated: 34 mL/min — ABNORMAL LOW (ref >60)
Glucose, Bld: 154 mg/dL — ABNORMAL HIGH (ref 70–99)
Potassium: 4 mmol/L (ref 3.5–5.1)
Sodium: 139 mmol/L (ref 135–145)
Total Bilirubin: 0.5 mg/dL (ref 0.0–1.2)
Total Protein: 6.5 g/dL (ref 6.5–8.1)

## 2024-01-02 LAB — GLUCOSE, CAPILLARY: Glucose-Capillary: 104 mg/dL — ABNORMAL HIGH (ref 70–99)

## 2024-01-02 MED ORDER — MEMANTINE HCL 10 MG PO TABS
10.0000 mg | ORAL_TABLET | Freq: Two times a day (BID) | ORAL | Status: DC
  Administered 2024-01-02 – 2024-01-10 (×15): 10 mg via ORAL
  Filled 2024-01-02 (×17): qty 1

## 2024-01-02 MED ORDER — HYDROMORPHONE HCL 1 MG/ML IJ SOLN
0.5000 mg | Freq: Four times a day (QID) | INTRAMUSCULAR | Status: DC | PRN
  Administered 2024-01-02 – 2024-01-03 (×2): 0.5 mg via INTRAVENOUS
  Filled 2024-01-02: qty 0.5
  Filled 2024-01-02: qty 1

## 2024-01-02 MED ORDER — ONDANSETRON HCL 4 MG/2ML IJ SOLN: 4.0000 mg | Freq: Four times a day (QID) | INTRAMUSCULAR | Status: DC | PRN

## 2024-01-02 MED ORDER — DOCUSATE SODIUM 100 MG PO CAPS
100.0000 mg | ORAL_CAPSULE | Freq: Two times a day (BID) | ORAL | Status: DC
  Administered 2024-01-02 – 2024-01-06 (×7): 100 mg via ORAL
  Filled 2024-01-02 (×7): qty 1

## 2024-01-02 MED ORDER — GABAPENTIN 300 MG PO CAPS: 300.0000 mg | ORAL_CAPSULE | ORAL | Status: DC

## 2024-01-02 MED ORDER — SODIUM CHLORIDE 0.9 % IV SOLN: INTRAVENOUS | Status: AC

## 2024-01-02 MED ORDER — DONEPEZIL HCL 10 MG PO TABS
10.0000 mg | ORAL_TABLET | Freq: Every day | ORAL | Status: DC
  Administered 2024-01-04 – 2024-01-10 (×7): 10 mg via ORAL
  Filled 2024-01-02 (×7): qty 1

## 2024-01-02 MED ORDER — ACETAMINOPHEN 650 MG RE SUPP: 650.0000 mg | Freq: Four times a day (QID) | RECTAL | Status: DC | PRN

## 2024-01-02 MED ORDER — ALPRAZOLAM 0.5 MG PO TABS
0.5000 mg | ORAL_TABLET | Freq: Three times a day (TID) | ORAL | Status: DC | PRN
  Administered 2024-01-08 – 2024-01-09 (×2): 0.5 mg via ORAL
  Filled 2024-01-02 (×2): qty 1

## 2024-01-02 MED ORDER — GABAPENTIN 300 MG PO CAPS
600.0000 mg | ORAL_CAPSULE | Freq: Every day | ORAL | Status: DC
  Administered 2024-01-02 – 2024-01-09 (×8): 600 mg via ORAL
  Filled 2024-01-02 (×8): qty 2

## 2024-01-02 MED ORDER — OXYCODONE HCL 5 MG PO TABS: 5.0000 mg | ORAL_TABLET | ORAL | Status: DC | PRN

## 2024-01-02 MED ORDER — LORAZEPAM 2 MG/ML IJ SOLN
1.0000 mg | Freq: Once | INTRAMUSCULAR | Status: AC
  Administered 2024-01-02: 1 mg via INTRAVENOUS
  Filled 2024-01-02: qty 1

## 2024-01-02 MED ORDER — PANTOPRAZOLE SODIUM 40 MG PO TBEC
40.0000 mg | DELAYED_RELEASE_TABLET | Freq: Every day | ORAL | Status: DC
  Administered 2024-01-04 – 2024-01-10 (×7): 40 mg via ORAL
  Filled 2024-01-02 (×7): qty 1

## 2024-01-02 MED ORDER — SENNOSIDES-DOCUSATE SODIUM 8.6-50 MG PO TABS: 1.0000 | ORAL_TABLET | Freq: Every evening | ORAL | Status: DC | PRN

## 2024-01-02 MED ORDER — GABAPENTIN 300 MG PO CAPS
300.0000 mg | ORAL_CAPSULE | Freq: Every morning | ORAL | Status: DC
Start: 2024-01-03 — End: 2024-01-10
  Administered 2024-01-04 – 2024-01-10 (×7): 300 mg via ORAL
  Filled 2024-01-02 (×7): qty 1

## 2024-01-02 MED ORDER — ONDANSETRON HCL 4 MG PO TABS: 4.0000 mg | ORAL_TABLET | Freq: Four times a day (QID) | ORAL | Status: DC | PRN

## 2024-01-02 MED ORDER — CHLORHEXIDINE GLUCONATE 4 % EX SOLN: 60.0000 mL | Freq: Once | CUTANEOUS | Status: DC

## 2024-01-02 MED ORDER — OMEGA-3-ACID ETHYL ESTERS 1 G PO CAPS
1.0000 g | ORAL_CAPSULE | Freq: Every day | ORAL | Status: DC
  Administered 2024-01-04 – 2024-01-10 (×7): 1 g via ORAL
  Filled 2024-01-02 (×7): qty 1

## 2024-01-02 MED ORDER — CARBIDOPA-LEVODOPA 25-100 MG PO TABS
1.0000 | ORAL_TABLET | Freq: Two times a day (BID) | ORAL | Status: DC
  Administered 2024-01-02 – 2024-01-10 (×15): 1 via ORAL
  Filled 2024-01-02 (×15): qty 1

## 2024-01-02 MED ORDER — TIZANIDINE HCL 2 MG PO TABS
4.0000 mg | ORAL_TABLET | Freq: Three times a day (TID) | ORAL | Status: DC | PRN
  Administered 2024-01-02: 4 mg via ORAL
  Filled 2024-01-02: qty 2

## 2024-01-02 MED ORDER — PRIMIDONE 50 MG PO TABS
100.0000 mg | ORAL_TABLET | Freq: Two times a day (BID) | ORAL | Status: DC
  Administered 2024-01-02 – 2024-01-10 (×15): 100 mg via ORAL
  Filled 2024-01-02 (×18): qty 2

## 2024-01-02 MED ORDER — ACETAMINOPHEN 500 MG PO TABS
1000.0000 mg | ORAL_TABLET | Freq: Once | ORAL | Status: AC
  Administered 2024-01-03: 1000 mg via ORAL
  Filled 2024-01-02: qty 2

## 2024-01-02 MED ORDER — ACETAMINOPHEN 500 MG PO TABS
1000.0000 mg | ORAL_TABLET | Freq: Three times a day (TID) | ORAL | Status: DC
  Administered 2024-01-02 – 2024-01-10 (×21): 1000 mg via ORAL
  Filled 2024-01-02 (×21): qty 2

## 2024-01-02 MED ORDER — HYDROMORPHONE HCL 1 MG/ML IJ SOLN
0.5000 mg | Freq: Once | INTRAMUSCULAR | Status: AC
  Administered 2024-01-02: 0.5 mg via INTRAVENOUS
  Filled 2024-01-02: qty 1

## 2024-01-02 MED ORDER — TRANEXAMIC ACID-NACL 1000-0.7 MG/100ML-% IV SOLN
1000.0000 mg | INTRAVENOUS | Status: AC
  Administered 2024-01-03: 1000 mg via INTRAVENOUS
  Filled 2024-01-02: qty 100

## 2024-01-02 MED ORDER — TRAZODONE HCL 100 MG PO TABS
200.0000 mg | ORAL_TABLET | Freq: Every day | ORAL | Status: DC
  Administered 2024-01-02 – 2024-01-08 (×7): 200 mg via ORAL
  Filled 2024-01-02 (×7): qty 2

## 2024-01-02 MED ORDER — POVIDONE-IODINE 10 % EX SWAB
2.0000 | Freq: Once | CUTANEOUS | Status: AC
  Administered 2024-01-03: 2 via TOPICAL

## 2024-01-02 MED ORDER — LAMOTRIGINE 25 MG PO TABS
150.0000 mg | ORAL_TABLET | Freq: Two times a day (BID) | ORAL | Status: DC
  Administered 2024-01-02 – 2024-01-10 (×15): 150 mg via ORAL
  Filled 2024-01-02 (×15): qty 2

## 2024-01-02 MED ORDER — ACETAMINOPHEN 325 MG PO TABS: 650.0000 mg | ORAL_TABLET | Freq: Four times a day (QID) | ORAL | Status: DC | PRN

## 2024-01-02 MED ORDER — INSULIN ASPART 100 UNIT/ML IJ SOLN
0.0000 [IU] | Freq: Three times a day (TID) | INTRAMUSCULAR | Status: DC
  Administered 2024-01-03: 3 [IU] via SUBCUTANEOUS
  Filled 2024-01-02: qty 0.15

## 2024-01-02 MED ORDER — FLUOXETINE HCL 20 MG PO CAPS
80.0000 mg | ORAL_CAPSULE | Freq: Every day | ORAL | Status: DC
  Administered 2024-01-04 – 2024-01-10 (×7): 80 mg via ORAL
  Filled 2024-01-02 (×7): qty 4

## 2024-01-02 MED ORDER — CEFAZOLIN SODIUM-DEXTROSE 2-4 GM/100ML-% IV SOLN
2.0000 g | INTRAVENOUS | Status: AC
  Administered 2024-01-03: 2 g via INTRAVENOUS
  Filled 2024-01-02: qty 100

## 2024-01-02 MED ORDER — POLYETHYLENE GLYCOL 3350 17 G PO PACK: 17.0000 g | PACK | Freq: Every day | ORAL | Status: DC | PRN

## 2024-01-02 MED ORDER — ROSUVASTATIN CALCIUM 5 MG PO TABS
10.0000 mg | ORAL_TABLET | Freq: Every day | ORAL | Status: DC
  Administered 2024-01-02 – 2024-01-09 (×8): 10 mg via ORAL
  Filled 2024-01-02 (×8): qty 2

## 2024-01-02 MED ORDER — OXYBUTYNIN CHLORIDE 5 MG PO TABS
5.0000 mg | ORAL_TABLET | Freq: Two times a day (BID) | ORAL | Status: DC
  Administered 2024-01-02 – 2024-01-10 (×15): 5 mg via ORAL
  Filled 2024-01-02 (×17): qty 1

## 2024-01-02 MED ORDER — SENNOSIDES-DOCUSATE SODIUM 8.6-50 MG PO TABS
2.0000 | ORAL_TABLET | Freq: Every day | ORAL | Status: DC
Start: 2024-01-02 — End: 2024-01-06
  Administered 2024-01-04 – 2024-01-06 (×3): 2 via ORAL
  Filled 2024-01-02 (×3): qty 2

## 2024-01-02 MED ORDER — HYDROMORPHONE HCL 1 MG/ML IJ SOLN
1.0000 mg | Freq: Once | INTRAMUSCULAR | Status: AC
  Administered 2024-01-02: 1 mg via INTRAVENOUS
  Filled 2024-01-02: qty 1

## 2024-01-02 MED ORDER — ADULT MULTIVITAMIN W/MINERALS CH
1.0000 | ORAL_TABLET | Freq: Every day | ORAL | Status: DC
  Administered 2024-01-03 – 2024-01-09 (×7): 1 via ORAL
  Filled 2024-01-02 (×7): qty 1

## 2024-01-02 MED ORDER — FLUCONAZOLE 150 MG PO TABS: 150.0000 mg | ORAL_TABLET | ORAL | Status: DC

## 2024-01-02 MED ORDER — ONDANSETRON HCL 4 MG/2ML IJ SOLN
4.0000 mg | Freq: Once | INTRAMUSCULAR | Status: AC
  Administered 2024-01-02: 4 mg via INTRAVENOUS
  Filled 2024-01-02: qty 2

## 2024-01-02 MED ORDER — FENTANYL CITRATE PF 50 MCG/ML IJ SOSY
50.0000 ug | PREFILLED_SYRINGE | Freq: Once | INTRAMUSCULAR | Status: AC
  Administered 2024-01-02: 50 ug via INTRAVENOUS
  Filled 2024-01-02: qty 1

## 2024-01-02 MED ORDER — LEVETIRACETAM 750 MG PO TABS
750.0000 mg | ORAL_TABLET | Freq: Two times a day (BID) | ORAL | Status: DC
  Administered 2024-01-02 – 2024-01-10 (×15): 750 mg via ORAL
  Filled 2024-01-02 (×15): qty 1

## 2024-01-02 MED ORDER — PREDNISONE 5 MG PO TABS
5.0000 mg | ORAL_TABLET | Freq: Every day | ORAL | Status: DC
  Administered 2024-01-04: 5 mg via ORAL
  Filled 2024-01-02: qty 1

## 2024-01-02 NOTE — Progress Notes (Signed)
 Orthopedic Tech Progress Note Patient Details:  Valerie [name] 1960-11-26 [id number]  Ortho Devices Type of Ortho Device: Knee Immobilizer Ortho Device/Splint Location: RLE Ortho Device/Splint Interventions: Ordered, Application, Adjustment   Post Interventions Patient Tolerated: Well  Herbie Loll 01/02/2024, 5:15 PM

## 2024-01-02 NOTE — ED Triage Notes (Signed)
 Pt reports falling off a curb injuring her right knee. Pt has swelling to the right thigh and knee. Unable to bear weight.

## 2024-01-02 NOTE — H&P (Signed)
 History and Physical  Valerie [name] EXB:[id number] DOB: 1961-03-04 DOA: 01/02/2024  PCP: Vevelyn Gowers, NP   Chief Complaint: Fall, right leg pain  HPI: Valerie [name] is a 63 y.o. female with medical history significant for anxiety/depression, NSTEMI in 2021, diabetes, CKD stage IIIB, peripheral neuropathy, bipolar disorder, hypothyroidism, psoriatic arthritis, CVA, dementia, OSA and a recent right total knee replacement in February 2025 by Dr. Agatha Horsfall who presented to the ER for evaluation of right leg pain after a fall.  Patient reports she was out in downtown eating for Mother's Day. Afterwards, a friend was picking her up and as she was walking, she tripped over a curb, fell and landed directly on her right knee.  Started having severe right leg pain and noticed deformity of the proximal part of her right knee.  She was unable to bear weight so some bystanders picked her up and her friend drove her to the ED.  She denies any dizziness, headaches, vision changes, fevers, chills, chest pain, syncope or leg swelling.  Reports a history of falls with her last fall in February 2025 prior to her knee replacement.  She is also scheduled for back surgery on May/29.  ED Course: Initial vitals showed patient afebrile, normotensive and overall stable. Labs show WBC 9.3, Hgb 11.3, platelet 237, sodium 139, K+ 4.0, bicarb 19, glucose 154, BUN/creatinine 26/1.68.  X-ray of the right knee and femur shows acute displaced and overriding distal femur fracture extending to the femoral component of the knee arthroplasty but no acute fracture of the pelvis. Patient received IV fentanyl  50 mcg, IV Dilaudid  0.5 mg, IV Ativan  1 mg and IV Zofran  4 mg. Orthopedic surgery was consulted. [facility] was consulted for admission.  Review of Systems: Please see HPI for pertinent positives and negatives. A complete 10 system review of systems are otherwise negative.  Past Medical History:  Diagnosis Date   Anemia    Anxiety     Arthritis    Psoriatic   Bipolar disorder (HCC)    CKD (chronic kidney disease), stage III (HCC)    Colovesical fistula    Dementia (HCC)    Depression    DM type 2 (diabetes mellitus, type 2) (HCC) 11/14/2019   Dyspnea    E coli bacteremia 07/28/2023   Headache    Hyperthyroidism    Neuromuscular disorder (HCC)    Neuropathy    NSTEMI (non-ST elevated myocardial infarction) (HCC) 11/14/2019   OSA (obstructive sleep apnea)    Primary localized osteoarthritis of right knee 10/19/2023   Psoriatic arthritis (HCC)    Pyelonephritis 07/23/2023   Sleep apnea    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   Stroke (HCC)    ?history; MRI normal   Past Surgical History:  Procedure Laterality Date   ABDOMINAL HYSTERECTOMY     APPENDECTOMY     CHOLECYSTECTOMY     CYSTOSCOPY WITH STENT PLACEMENT N/A 07/17/2020   Procedure: CYSTOSCOPY WITH BILATERAL FIREFLY INJECTION;  Surgeon: Florencio Hunting, MD;  Location: [facility];  Service: Urology;  Laterality: N/A;   FLEXIBLE SIGMOIDOSCOPY N/A 05/17/2020   Procedure: FLEXIBLE SIGMOIDOSCOPY;  Surgeon: Alvis Jourdain, MD;  Location: [facility];  Service: Endoscopy;  Laterality: N/A;   FOOT SURGERY Right    IR FLUORO GUIDE CV LINE RIGHT  08/19/2020   IR RADIOLOGIST EVAL & MGMT  09/10/2020   IR RADIOLOGIST EVAL & MGMT  10/10/2020   IR REMOVAL TUN CV CATH W/O FL  09/16/2020   IR SINUS/FIST TUBE CHK-NON GI  10/24/2020   IR SINUS/FIST TUBE CHK-NON GI  10/31/2020   IR US  GUIDE VASC ACCESS RIGHT  08/19/2020   LEFT HEART CATH AND CORONARY ANGIOGRAPHY N/A 11/14/2019   Procedure: LEFT HEART CATH AND CORONARY ANGIOGRAPHY;  Surgeon: Swazil[place], Peter M, MD;  Location: [facility] INVASIVE CV LAB;  Service: Cardiovascular;  Laterality: N/A;   PARATHYROID  EXPLORATION N/A 01/09/2021   Procedure: POSSIBLE NECK EXPLORATION;  Surgeon: Oralee Billow, MD;  Location: [facility];  Service: General;  Laterality: N/A;   PARATHYROIDECTOMY Left 01/09/2021   Procedure: LEFT INFERIOR PARATHYROIDECTOMY;   Surgeon: Oralee Billow, MD;  Location: [facility];  Service: General;  Laterality: Left;   PROCTOSCOPY N/A 07/17/2020   Procedure: RIGID PROCTOSCOPY;  Surgeon: Candyce Champagne, MD;  Location: [facility];  Service: General;  Laterality: N/A;   TONSILLECTOMY     TOTAL HIP ARTHROPLASTY Bilateral    TOTAL KNEE ARTHROPLASTY Right 10/19/2023   Procedure: TOTAL KNEE ARTHROPLASTY;  Surgeon: Osa Blase, MD;  Location: [facility];  Service: Orthopedics;  Laterality: Right;   Social History:  reports that she has quit smoking. Her smoking use included cigarettes. She has been exposed to tobacco smoke. She has never used smokeless tobacco. She reports current alcohol use. She reports that she does not use drugs.  Allergies  Allergen Reactions   Carbamazepine Other (See Comments)    Parkinsons like symptoms tremors   Sertraline Hcl Other (See Comments)    Parkinsons like symptoms    Family History  Problem Relation Age of Onset   Breast cancer Neg Hx      Prior to Admission medications   Medication Sig Start Date End Date Taking? Authorizing Provider  ALPRAZolam  (XANAX ) 0.5 MG tablet Take 0.5 tablets (0.25 mg total) by mouth 3 (three) times daily as needed for anxiety. Patient taking differently: Take 0.5 mg by mouth 3 (three) times daily as needed for anxiety. 07/28/23   Verlyn Goad, MD  aspirin  EC 81 MG tablet Take 81 mg by mouth daily. Swallow whole.    [provider]  baclofen  (LIORESAL ) 10 MG tablet Take 1 tablet (10 mg total) by mouth 3 (three) times daily. As needed for muscle spasm 10/19/23   Osa Blase, MD  carbidopa -levodopa  (SINEMET  IR) 25-100 MG tablet Take 1 tablet by mouth in the morning and at bedtime. 08/27/22   [provider]  Docusate Calcium  (STOOL SOFTENER PO) Take 1 tablet by mouth in the morning and at bedtime.    [provider]  donepezil  (ARICEPT ) 10 MG tablet Take 10 mg by mouth daily. 10/30/19   [provider]  fluconazole  (DIFLUCAN ) 150 MG  tablet Take 150 mg by mouth every 3 (three) days. 07/29/23   [provider]  FLUoxetine  (PROZAC ) 40 MG capsule Take 1 capsule (40 mg total) by mouth daily. Patient taking differently: Take 80 mg by mouth daily. 07/29/23   Verlyn Goad, MD  gabapentin  (NEURONTIN ) 300 MG capsule Take 300-600 mg by mouth See admin instructions. Take 300 mg in the morning and 600 mg at night 07/29/23   [provider]  lamoTRIgine  (LAMICTAL ) 150 MG tablet Take 1 tablet by mouth 2 (two) times daily. 12/28/22   [provider]  Lecanemab-irmb (LEQEMBI IV) Inject into the vein every 14 (fourteen) days.    [provider]  levETIRAcetam  (KEPPRA ) 750 MG tablet Take 750 mg by mouth 2 (two) times daily.    [provider]  levocetirizine (XYZAL ) 5 MG tablet Take 1 tablet (5 mg  total) by mouth every evening. 08/11/23   Adelia Homestead, MD  meclizine  (ANTIVERT ) 25 MG tablet Take 25 mg by mouth 3 (three) times daily as needed for dizziness.    [provider]  memantine  (NAMENDA ) 10 MG tablet Take 10 mg by mouth 2 (two) times daily. 03/18/22   [provider]  Multiple Vitamins-Minerals (MULTIVITAMIN WITH MINERALS) tablet Take 1 tablet by mouth at bedtime.    [provider]  nitroGLYCERIN  (NITROSTAT ) 0.4 MG SL tablet Place 1 tablet (0.4 mg total) under the tongue every 5 (five) minutes as needed for chest pain. 05/03/23   Zorita Hiss, FNP  Omega-3 Fatty Acids (FISH OIL PO) Take 2 capsules by mouth daily.    [provider]  omeprazole  (PRILOSEC) 40 MG capsule Take 1 capsule (40 mg total) by mouth daily. 08/11/23   Adelia Homestead, MD  ondansetron  (ZOFRAN ) 8 MG tablet Take 1 tablet (8 mg total) by mouth every 8 (eight) hours as needed for nausea or vomiting. 10/19/23   Osa Blase, MD  oxybutynin  (DITROPAN ) 5 MG tablet Take 5 mg by mouth 2 (two) times daily. 09/13/23   [provider]  oxyCODONE  (OXY IR/ROXICODONE ) 5 MG  immediate release tablet Take 5 mg by mouth every 4 (four) hours as needed for severe pain (pain score 7-10).    [provider]  polyethylene glycol (MIRALAX  / GLYCOLAX ) 17 g packet Take 17 g by mouth 2 (two) times daily. Patient taking differently: Take 17 g by mouth daily as needed for moderate constipation. 07/28/23   Verlyn Goad, MD  predniSONE  (DELTASONE ) 5 MG tablet TAKE 1 TABLET(5 MG) BY MOUTH DAILY WITH BREAKFAST 10/07/23   Adelia Homestead, MD  primidone  (MYSOLINE ) 50 MG tablet Take 100 mg by mouth 2 (two) times daily. 04/09/22   [provider]  rizatriptan  (MAXALT ) 10 MG tablet Take 10 mg by mouth as needed for migraine. May repeat in 2 hours if needed    [provider]  rosuvastatin  (CRESTOR ) 10 MG tablet Take 1 tablet (10 mg total) by mouth daily. 12/04/22   Adelia Homestead, MD  sennosides-docusate sodium  (SENOKOT-S) 8.6-50 MG tablet Take 2 tablets by mouth daily. Patient not taking: Reported on 10/21/2023 10/19/23   Osa Blase, MD  traZODone  (DESYREL ) 100 MG tablet Take 2 tablets (200 mg total) by mouth at bedtime. 07/23/20   Monetta Angst, PA-C    Physical Exam: BP 128/68   Pulse 64   Temp 98.1 F (36.7 C) (Oral)   Resp 15   Ht 5\' 2"  (1.575 m)   Wt 95.3 kg   SpO2 97%   BMI 38.41 kg/m  General: Pleasant, well-appearing elderly woman laying in bed. No acute distress. HEENT: [place]/AT. Anicteric sclera. Dry lips. CV: RRR. No murmurs, rubs, or gallops. No LE edema Pulmonary: Lungs CTAB. Normal effort. No wheezing or rales. Abdominal: Soft, nontender, nondistended. Normal bowel sounds. MSK: RLE slightly shortened with mild edema to the right thigh.  Moderately tender to palpation to the distal aspect of the right femur.  No erythema, deformity or ecchymosis. No ttp of the R knee.  Neurovascularly intact distally. Skin: Warm and dry. No obvious rash or lesions. Neuro: A&Ox3. Moves all extremities. Normal sensation to light touch. No  focal deficit. Psych: Normal mood and affect          Labs on Admission:  Basic Metabolic Panel: Recent Labs  Lab 01/02/24 1419  NA 139  K 4.0  CL  104  CO2 19*  GLUCOSE 154*  BUN 26*  CREATININE 1.68*  CALCIUM  8.6*   Liver Function Tests: Recent Labs  Lab 01/02/24 1419  AST 23  ALT 6  ALKPHOS 91  BILITOT 0.5  PROT 6.5  ALBUMIN 3.6   No results for input(s): "LIPASE", "AMYLASE" in the last 168 hours. No results for input(s): "AMMONIA" in the last 168 hours. CBC: Recent Labs  Lab 01/02/24 1419  WBC 9.3  NEUTROABS 7.4  HGB 11.3*  HCT 37.8  MCV 99.5  PLT 237   Cardiac Enzymes: No results for input(s): "CKTOTAL", "CKMB", "CKMBINDEX", "TROPONINI" in the last 168 hours. BNP (last 3 results) No results for input(s): "BNP" in the last 8760 hours.  ProBNP (last 3 results) No results for input(s): "PROBNP" in the last 8760 hours.  CBG: No results for input(s): "GLUCAP" in the last 168 hours.  Radiological Exams on Admission: DG Chest Port 1 View Result Date: 01/02/2024 CLINICAL DATA:  Preop, femur fracture EXAM: PORTABLE CHEST 1 VIEW COMPARISON:  Radiograph 10/21/2023 FINDINGS: Stable mild cardiomegaly.The cardiomediastinal contours are unchanged. The lungs are clear. Pulmonary vasculature is normal. No consolidation, pleural effusion, or pneumothorax. Remote right rib fracture. No acute osseous abnormalities are seen. IMPRESSION: Stable mild cardiomegaly. No acute chest findings. Electronically Signed   By: Chadwick Colonel M.D.   On: 01/02/2024 14:55   DG Femur Min 2 Views Right Result Date: 01/02/2024 CLINICAL DATA:  Trip and fall with injury to the right lower extremity. EXAM: PELVIS - 1-2 VIEW; RIGHT FEMUR 2 VIEWS; RIGHT KNEE - COMPLETE 4+ VIEW COMPARISON:  None Available. FINDINGS: Pelvis: Bilateral hip arthroplasties are intact were visualized. No periprosthetic fracture. No acute fracture of the pelvis. Pubic rami are intact. Femur: Periprosthetic fracture will  be described below. The femoral shaft is intact. Knee: Knee arthroplasty is in place. Acute distal femur fracture extends to the femoral component with significant displacement. Osseous overriding is at least 5 cm. The tibial and femoral components appear congruent. IMPRESSION: 1. Acute displaced and overriding distal femur fracture extending to the femoral component of the knee arthroplasty. 2. No acute fracture of the pelvis.  Intact hip arthroplasties. Electronically Signed   By: Chadwick Colonel M.D.   On: 01/02/2024 14:54   DG Knee Complete 4 Views Right Result Date: 01/02/2024 CLINICAL DATA:  Trip and fall with injury to the right lower extremity. EXAM: PELVIS - 1-2 VIEW; RIGHT FEMUR 2 VIEWS; RIGHT KNEE - COMPLETE 4+ VIEW COMPARISON:  None Available. FINDINGS: Pelvis: Bilateral hip arthroplasties are intact were visualized. No periprosthetic fracture. No acute fracture of the pelvis. Pubic rami are intact. Femur: Periprosthetic fracture will be described below. The femoral shaft is intact. Knee: Knee arthroplasty is in place. Acute distal femur fracture extends to the femoral component with significant displacement. Osseous overriding is at least 5 cm. The tibial and femoral components appear congruent. IMPRESSION: 1. Acute displaced and overriding distal femur fracture extending to the femoral component of the knee arthroplasty. 2. No acute fracture of the pelvis.  Intact hip arthroplasties. Electronically Signed   By: Chadwick Colonel M.D.   On: 01/02/2024 14:54   DG Pelvis 1-2 Views Result Date: 01/02/2024 CLINICAL DATA:  Trip and fall with injury to the right lower extremity. EXAM: PELVIS - 1-2 VIEW; RIGHT FEMUR 2 VIEWS; RIGHT KNEE - COMPLETE 4+ VIEW COMPARISON:  None Available. FINDINGS: Pelvis: Bilateral hip arthroplasties are intact were visualized. No periprosthetic fracture. No acute fracture of the pelvis. Pubic rami are  intact. Femur: Periprosthetic fracture will be described below. The  femoral shaft is intact. Knee: Knee arthroplasty is in place. Acute distal femur fracture extends to the femoral component with significant displacement. Osseous overriding is at least 5 cm. The tibial and femoral components appear congruent. IMPRESSION: 1. Acute displaced and overriding distal femur fracture extending to the femoral component of the knee arthroplasty. 2. No acute fracture of the pelvis.  Intact hip arthroplasties. Electronically Signed   By: Chadwick Colonel M.D.   On: 01/02/2024 14:54   Assessment/Plan Valerie [name] is a 63 y.o. female with medical history significant for anxiety/depression, NSTEMI in 2021, diabetes, CKD stage IIIB, peripheral neuropathy, bipolar disorder, hypothyroidism, psoriatic arthritis, tremor, urinary incontinence, seizure disorder, CVA, dementia, OSA and a recent right total knee replacement in February 2025 by Dr. Agatha Horsfall who presented to the ER for evaluation of right leg pain after a fall and found to have closed fracture of the right distal femur.  # Fall # Right distal femur fracture # Right periprosthetic femur fracture -Pt with right knee replacement 3 months ago now presenting after mechanical fall - Imaging shows a displaced right periprosthetic femur fracture - Orthopedic surgery consulted, knee surgery done by Dr. Agatha Horsfall, however due to the complexity of the fracture, patient will need subspecialty trauma expertise at [facility] - Admit to Med[facility]urg bed at [facility] for ORIF of periprosthetic fracture by Dr. Curtiss Dowdy tmr - Pain control with scheduled Tylenol , as needed oxycodone , tizanidine and IV Dilaudid  - Knee immobilizer per Ortho - Continue daily Colace and Senokot-S, PRN MiraLAX  - Follow-up vitamin D  levels - Fall precautions  # AKI on CKD 3B - Baseline creatinine around 1.3-1.4 - Creatinine of 1.68 on admission, likely mild dehydration - Start IV NS at 100 cc/h for 1 day - Avoid nephrotoxic agents and trend renal function  # T2DM - A1c  5.3% 2 months ago -Blood sugar of 154 on CMP -SSI with meals, CBG monitoring  # Peripheral neuropathy - Continue gabapentin   # Anxiety and depression - Continue fluoxetine , trazodone  and as needed Xanax   # Bipolar disorder # Seizure disorder - Continue Lamictal  and Keppra   # Dementia # Tremor - Continue memantine , Sinemet  and Aricept  - Continue primidone   # Psoriatic arthritis - On Embrel injections weekly - Continue prednisone   # Urinary incontinence - Continue oxybutynin   # Hx of CVA # HLD - Continue rosuvastatin  and omega-3 fatty acid  # GERD -PPI  DVT prophylaxis: SCDs    Code Status: Full Code  Consults called: Orthopedic surgery  Family Communication: No family at bedside  Severity of Illness: The appropriate patient status for this patient is INPATIENT. Inpatient status is judged to be reasonable and necessary in order to provide the required intensity of service to ensure the patient's safety. The patient's presenting symptoms, physical exam findings, and initial radiographic and laboratory data in the context of their chronic comorbidities is felt to place them at high risk for further clinical deterioration. Furthermore, it is not anticipated that the patient will be medically stable for discharge from the hospital within 2 midnights of admission.   * I certify that at the point of admission it is my clinical judgment that the patient will require inpatient hospital care spanning beyond 2 midnights from the point of admission due to high intensity of service, high risk for further deterioration and high frequency of surveillance required.*  Level of care: Med-Surg   This record has been created using Conservation officer, historic buildings. Errors  have been sought and corrected, but may not always be located. Such creation errors do not reflect on the standard of care.   Vita Grip, MD 01/02/2024, 4:29 PM [facility] Pager: [phone] Isaiah  41:10   If 7PM-7AM, please contact night-coverage [email or web address] Password [email or web address]

## 2024-01-02 NOTE — Progress Notes (Signed)
 Received patient from [facility] via ambulance.  Alert and oriented x4 - states she is not a diabetic but aware that we will be checking her blood sugars.   Family at bedside

## 2024-01-02 NOTE — Consult Note (Addendum)
 ORTHOPAEDIC CONSULTATION  REQUESTING PHYSICIAN: Zackowski, Scott, MD  Chief Complaint: Right leg pain  HPI: Valerie [name] is a 63 y.o. female with history of anxiety/depression, NSTEMI in 2021, diabetes, CKD stage III, peripheral neuropathy, bipolar disorder, hypothyroidism who complains of right leg pain.  She has a history of a right total hip replacement as well as recent right total knee replacement performed by Dr. Agatha Horsfall on 10/19/2023.  Her recovery has so far been complicated by a admission due to UTI 2 days after surgery, however when she recovered from this she proceeded to progress appropriately.  Earlier today she was walking and tripped over a curb, she fell and landed directly onto her right knee.  She had immediate severe right leg pain and deformity.  At this time she states pain is located at the right upper leg worse with movement and better with rest and pain medication.  Denies new paresthesias.  She is not on a blood thinner.  Denies pain elsewhere. She is a smoker and smokes about half pack cigarettes a day  Past Medical History:  Diagnosis Date   Anemia    Anxiety    Arthritis    Psoriatic   Bipolar disorder (HCC)    CKD (chronic kidney disease), stage III (HCC)    Colovesical fistula    Dementia (HCC)    Depression    DM type 2 (diabetes mellitus, type 2) (HCC) 11/14/2019   Dyspnea    E coli bacteremia 07/28/2023   Headache    Hyperthyroidism    Neuromuscular disorder (HCC)    Neuropathy    NSTEMI (non-ST elevated myocardial infarction) (HCC) 11/14/2019   OSA (obstructive sleep apnea)    Primary localized osteoarthritis of right knee 10/19/2023   Psoriatic arthritis (HCC)    Pyelonephritis 07/23/2023   Sleep apnea    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   Stroke (HCC)    ?history; MRI normal   Past Surgical History:  Procedure Laterality Date   ABDOMINAL HYSTERECTOMY     APPENDECTOMY     CHOLECYSTECTOMY     CYSTOSCOPY WITH STENT PLACEMENT  N/A 07/17/2020   Procedure: CYSTOSCOPY WITH BILATERAL FIREFLY INJECTION;  Surgeon: Florencio Hunting, MD;  Location: [facility];  Service: Urology;  Laterality: N/A;   FLEXIBLE SIGMOIDOSCOPY N/A 05/17/2020   Procedure: FLEXIBLE SIGMOIDOSCOPY;  Surgeon: Alvis Jourdain, MD;  Location: [facility];  Service: Endoscopy;  Laterality: N/A;   FOOT SURGERY Right    IR FLUORO GUIDE CV LINE RIGHT  08/19/2020   IR RADIOLOGIST EVAL & MGMT  09/10/2020   IR RADIOLOGIST EVAL & MGMT  10/10/2020   IR REMOVAL TUN CV CATH W/O FL  09/16/2020   IR SINUS/FIST TUBE CHK-NON GI  10/24/2020   IR SINUS/FIST TUBE CHK-NON GI  10/31/2020   IR US  GUIDE VASC ACCESS RIGHT  08/19/2020   LEFT HEART CATH AND CORONARY ANGIOGRAPHY N/A 11/14/2019   Procedure: LEFT HEART CATH AND CORONARY ANGIOGRAPHY;  Surgeon: Swazil[place], Peter M, MD;  Location: [facility] INVASIVE CV LAB;  Service: Cardiovascular;  Laterality: N/A;   PARATHYROID  EXPLORATION N/A 01/09/2021   Procedure: POSSIBLE NECK EXPLORATION;  Surgeon: Oralee Billow, MD;  Location: [facility];  Service: General;  Laterality: N/A;   PARATHYROIDECTOMY Left 01/09/2021   Procedure: LEFT INFERIOR PARATHYROIDECTOMY;  Surgeon: Oralee Billow, MD;  Location: [facility];  Service: General;  Laterality: Left;   PROCTOSCOPY N/A 07/17/2020   Procedure: RIGID PROCTOSCOPY;  Surgeon: Candyce Champagne, MD;  Location: [facility];  Service: General;  Laterality: N/A;   TONSILLECTOMY     TOTAL HIP ARTHROPLASTY Bilateral    TOTAL KNEE ARTHROPLASTY Right 10/19/2023   Procedure: TOTAL KNEE ARTHROPLASTY;  Surgeon: Osa Blase, MD;  Location: [facility];  Service: Orthopedics;  Laterality: Right;   Social History   Socioeconomic History   Marital status: Divorced    Spouse name: Not on file   Number of children: 2   Years of education: Not on file   Highest education level: Associate degree: occupational, Scientist, product/process development, or vocational program  Occupational History   Occupation: Disabled  Tobacco Use   Smoking status: Former    Current  packs/day: 0.50    Types: Cigarettes    Passive exposure: Past   Smokeless tobacco: Never  Vaping Use   Vaping status: Every Day  Substance and Sexual Activity   Alcohol use: Yes    Comment: rarely   Drug use: Never   Sexual activity: Not on file  Other Topics Concern   Not on file  Social History Narrative   Not on file   Social Drivers of Health   Financial Resource Strain: High Risk (01/11/2023)   Overall Financial Resource Strain (CARDIA)    Difficulty of Paying Living Expenses: Very hard  Food Insecurity: No Food Insecurity (10/21/2023)   Hunger Vital Sign    Worried About Running Out of Food in the Last Year: Never true    Ran Out of Food in the Last Year: Never true  Transportation Needs: No Transportation Needs (10/21/2023)   PRAPARE - Administrator, Civil Service (Medical): No    Lack of Transportation (Non-Medical): No  Physical Activity: Inactive (09/21/2023)   Exercise Vital Sign    Days of Exercise per Week: 0 days    Minutes of Exercise per Session: 0 min  Stress: Stress Concern Present (09/21/2023)   [employer] of Occupational Health - Occupational Stress Questionnaire    Feeling of Stress : To some extent  Social Connections: Socially Isolated (10/21/2023)   Social Connection and Isolation Panel [NHANES]    Frequency of Communication with Friends and Family: Once a week    Frequency of Social Gatherings with Friends and Family: Once a week    Attends Religious Services: Never    Database administrator or Organizations: No    Attends Engineer, structural: Never    Marital Status: Divorced   Family History  Problem Relation Age of Onset   Breast cancer Neg Hx    Allergies  Allergen Reactions   Carbamazepine Other (See Comments)    Parkinsons like symptoms tremors   Sertraline Hcl Other (See Comments)    Parkinsons like symptoms     Positive ROS: All other systems have been reviewed and were otherwise negative with the  exception of those mentioned in the HPI and as above.  Physical Exam: General: Alert, no acute distress Cardiovascular: No pedal edema Respiratory: No cyanosis, no use of accessory musculature GI: No organomegaly, abdomen is soft and non-tender Skin: No lesions in the area of chief complaint Neurologic: Sensation intact distally Psychiatric: Patient is competent for consent with normal mood and affect Lymphatic: No axillary or cervical lymphadenopathy  MUSCULOSKELETAL: Edema to right upper leg.  No significant ecchymosis or erythema.  Well-healed healed total knee surgical wound.  Dorsiflexion and plantarflexion intact.  Endorses sensation diffusely to the right foot. + DP pulse.  Able to wiggle all toes.  Imaging: X-rays of the right femur show a displaced right periprosthetic  femur fracture.  Intact total hip arthroplasty.  Assessment: Right periprosthetic femur fracture  Plan: - Discussed plan with patient.  This is an acute severe injury due to the nature of her fracture as well as her history of right total knee replacement and right hip arthroplasty and will require subspecialty trauma expertise.  I have contacted Dr. Curtiss Dowdy regarding possible right femur ORIF, this will be tentatively planned for tomorrow at [facility]. - Medicine planning to admit to [facility] in anticipation of surgery tomorrow. - Please keep n.p.o. after midnight. - in the meantime will add knee immobilizer   Abraham Hoffmann, PA-C    01/02/2024 4:28 PM

## 2024-01-02 NOTE — ED Provider Notes (Addendum)
 [place] EMERGENCY DEPARTMENT AT [facility] Provider Note   CSN: [id number] Arrival date & time: 01/02/24  1321     History  Chief Complaint  Patient presents with   Knee Pain   Fall    Valerie [name] is a 63 y.o. female.  Patient tripped over a curb while trying to get into a vehicle.  Lots of pain and deformity to the proximal part of her right knee.  Patient had knee replacement in February by Dr. Agatha Horsfall part of [facility].  Is not on blood thinners.  Patient denies any other injuries to the injury was just to the leg.  She did not pass out.  Did not hit her head.  Past medical history significant for depression anxiety history of non-STEMI in 2021 history of diabetes history of neuropathy chronic kidney disease stage III, bipolar disorder hypothyroidism and history of dementia.  Past surgical history is significant for heart cath in 2021 also total hip arthroplasty bilaterally foot surgery on the right appendectomy gallbladder removal and the total knee on the right.  Patient's still smokes half pack cigarettes a day.       Home Medications Prior to Admission medications   Medication Sig Start Date End Date Taking? Authorizing Provider  ALPRAZolam  (XANAX ) 0.5 MG tablet Take 0.5 tablets (0.25 mg total) by mouth 3 (three) times daily as needed for anxiety. Patient taking differently: Take 0.5 mg by mouth 3 (three) times daily as needed for anxiety. 07/28/23   Verlyn Goad, MD  aspirin  EC 81 MG tablet Take 81 mg by mouth daily. Swallow whole.    [provider]  baclofen  (LIORESAL ) 10 MG tablet Take 1 tablet (10 mg total) by mouth 3 (three) times daily. As needed for muscle spasm 10/19/23   Osa Blase, MD  carbidopa -levodopa  (SINEMET  IR) 25-100 MG tablet Take 1 tablet by mouth in the morning and at bedtime. 08/27/22   [provider]  Docusate Calcium  (STOOL SOFTENER PO) Take 1 tablet by mouth in the morning and at bedtime.    [provider]  donepezil  (ARICEPT ) 10 MG tablet Take 10 mg by mouth daily. 10/30/19   [provider]  fluconazole  (DIFLUCAN ) 150 MG tablet Take 150 mg by mouth every 3 (three) days. 07/29/23   [provider]  FLUoxetine  (PROZAC ) 40 MG capsule Take 1 capsule (40 mg total) by mouth daily. Patient taking differently: Take 80 mg by mouth daily. 07/29/23   Verlyn Goad, MD  gabapentin  (NEURONTIN ) 300 MG capsule Take 300-600 mg by mouth See admin instructions. Take 300 mg in the morning and 600 mg at night 07/29/23   [provider]  lamoTRIgine  (LAMICTAL ) 150 MG tablet Take 1 tablet by mouth 2 (two) times daily. 12/28/22   [provider]  Lecanemab-irmb (LEQEMBI IV) Inject into the vein every 14 (fourteen) days.    [provider]  levETIRAcetam  (KEPPRA ) 750 MG tablet Take 750 mg by mouth 2 (two) times daily.    [provider]  levocetirizine (XYZAL ) 5 MG tablet Take 1 tablet (5 mg total) by mouth every evening. 08/11/23   Adelia Homestead, MD  meclizine  (ANTIVERT ) 25 MG tablet Take 25 mg by mouth 3 (three) times daily as needed for dizziness.    [provider]  memantine  (NAMENDA ) 10 MG tablet Take 10 mg by mouth 2 (two) times daily. 03/18/22   [provider]  Multiple Vitamins-Minerals (MULTIVITAMIN WITH MINERALS) tablet Take 1 tablet  by mouth at bedtime.    [provider]  nitroGLYCERIN  (NITROSTAT ) 0.4 MG SL tablet Place 1 tablet (0.4 mg total) under the tongue every 5 (five) minutes as needed for chest pain. 05/03/23   Zorita Hiss, FNP  Omega-3 Fatty Acids (FISH OIL PO) Take 2 capsules by mouth daily.    [provider]  omeprazole  (PRILOSEC) 40 MG capsule Take 1 capsule (40 mg total) by mouth daily. 08/11/23   Adelia Homestead, MD  ondansetron  (ZOFRAN ) 8 MG tablet Take 1 tablet (8 mg total) by mouth every 8 (eight) hours as needed for nausea or vomiting. 10/19/23   Osa Blase, MD  oxybutynin   (DITROPAN ) 5 MG tablet Take 5 mg by mouth 2 (two) times daily. 09/13/23   [provider]  oxyCODONE  (OXY IR/ROXICODONE ) 5 MG immediate release tablet Take 5 mg by mouth every 4 (four) hours as needed for severe pain (pain score 7-10).    [provider]  polyethylene glycol (MIRALAX  / GLYCOLAX ) 17 g packet Take 17 g by mouth 2 (two) times daily. Patient taking differently: Take 17 g by mouth daily as needed for moderate constipation. 07/28/23   Verlyn Goad, MD  predniSONE  (DELTASONE ) 5 MG tablet TAKE 1 TABLET(5 MG) BY MOUTH DAILY WITH BREAKFAST 10/07/23   Adelia Homestead, MD  primidone  (MYSOLINE ) 50 MG tablet Take 100 mg by mouth 2 (two) times daily. 04/09/22   [provider]  rizatriptan  (MAXALT ) 10 MG tablet Take 10 mg by mouth as needed for migraine. May repeat in 2 hours if needed    [provider]  rosuvastatin  (CRESTOR ) 10 MG tablet Take 1 tablet (10 mg total) by mouth daily. 12/04/22   Adelia Homestead, MD  sennosides-docusate sodium  (SENOKOT-S) 8.6-50 MG tablet Take 2 tablets by mouth daily. Patient not taking: Reported on 10/21/2023 10/19/23   Osa Blase, MD  traZODone  (DESYREL ) 100 MG tablet Take 2 tablets (200 mg total) by mouth at bedtime. 07/23/20   Monetta Angst, PA-C      Allergies    Carbamazepine and Sertraline hcl    Review of Systems   Review of Systems  Constitutional:  Negative for chills and fever.  HENT:  Negative for ear pain and sore throat.   Eyes:  Negative for pain and visual disturbance.  Respiratory:  Negative for cough and shortness of breath.   Cardiovascular:  Negative for chest pain and palpitations.  Gastrointestinal:  Negative for abdominal pain and vomiting.  Genitourinary:  Negative for dysuria and hematuria.  Musculoskeletal:  Positive for joint swelling. Negative for arthralgias and back pain.  Skin:  Negative for color change and rash.  Neurological:  Negative for seizures and syncope.  All  other systems reviewed and are negative.   Physical Exam Updated Vital Signs BP 128/68   Pulse 64   Temp 98.1 F (36.7 C) (Oral)   Resp 15   Ht 1.575 m (5\' 2" )   Wt 95.3 kg   SpO2 97%   BMI 38.41 kg/m  Physical Exam Vitals and nursing note reviewed.  Constitutional:      General: She is not in acute distress.    Appearance: Normal appearance. She is well-developed. She is not ill-appearing.  HENT:     Head: Normocephalic and atraumatic.  Eyes:     Conjunctiva/sclera: Conjunctivae normal.  Cardiovascular:     Rate and Rhythm: Normal rate and regular rhythm.     Heart sounds: No murmur heard. Pulmonary:  Effort: Pulmonary effort is normal. No respiratory distress.     Breath sounds: Normal breath sounds.  Abdominal:     Palpations: Abdomen is soft.     Tenderness: There is no abdominal tenderness.  Musculoskeletal:        General: Tenderness and deformity present. No swelling.     Cervical back: Neck supple. No rigidity.     Comments: Proximal to the right knee obvious deformity with discomfort.  No distal swelling.  Good cap refill to the right foot.  Sensation intact on top of the foot and bottom of the foot.  Dorsalis pedis pulses 1+.  Good movement of toes.  Lymphadenopathy:     Cervical: No cervical adenopathy.  Skin:    General: Skin is warm and dry.     Capillary Refill: Capillary refill takes less than 2 seconds.  Neurological:     General: No focal deficit present.     Mental Status: She is alert and oriented to person, place, and time.  Psychiatric:        Mood and Affect: Mood normal.     ED Results / Procedures / Treatments   Labs (all labs ordered are listed, but only abnormal results are displayed) Labs Reviewed  CBC WITH DIFFERENTIAL/PLATELET - Abnormal; Notable for the following components:      Result Value   RBC 3.80 (*)    Hemoglobin 11.3 (*)    MCHC 29.9 (*)    All other components within normal limits  COMPREHENSIVE METABOLIC PANEL  WITH GFR - Abnormal; Notable for the following components:   CO2 19 (*)    Glucose, Bld 154 (*)    BUN 26 (*)    Creatinine, Ser 1.68 (*)    Calcium  8.6 (*)    GFR, Estimated 34 (*)    Anion gap 16 (*)    All other components within normal limits  URINALYSIS, ROUTINE W REFLEX MICROSCOPIC    EKG None  Radiology DG Chest Port 1 View Result Date: 01/02/2024 CLINICAL DATA:  Preop, femur fracture EXAM: PORTABLE CHEST 1 VIEW COMPARISON:  Radiograph 10/21/2023 FINDINGS: Stable mild cardiomegaly.The cardiomediastinal contours are unchanged. The lungs are clear. Pulmonary vasculature is normal. No consolidation, pleural effusion, or pneumothorax. Remote right rib fracture. No acute osseous abnormalities are seen. IMPRESSION: Stable mild cardiomegaly. No acute chest findings. Electronically Signed   By: Chadwick Colonel M.D.   On: 01/02/2024 14:55   DG Femur Min 2 Views Right Result Date: 01/02/2024 CLINICAL DATA:  Trip and fall with injury to the right lower extremity. EXAM: PELVIS - 1-2 VIEW; RIGHT FEMUR 2 VIEWS; RIGHT KNEE - COMPLETE 4+ VIEW COMPARISON:  None Available. FINDINGS: Pelvis: Bilateral hip arthroplasties are intact were visualized. No periprosthetic fracture. No acute fracture of the pelvis. Pubic rami are intact. Femur: Periprosthetic fracture will be described below. The femoral shaft is intact. Knee: Knee arthroplasty is in place. Acute distal femur fracture extends to the femoral component with significant displacement. Osseous overriding is at least 5 cm. The tibial and femoral components appear congruent. IMPRESSION: 1. Acute displaced and overriding distal femur fracture extending to the femoral component of the knee arthroplasty. 2. No acute fracture of the pelvis.  Intact hip arthroplasties. Electronically Signed   By: Chadwick Colonel M.D.   On: 01/02/2024 14:54   DG Knee Complete 4 Views Right Result Date: 01/02/2024 CLINICAL DATA:  Trip and fall with injury to the right lower  extremity. EXAM: PELVIS - 1-2 VIEW; RIGHT FEMUR 2  VIEWS; RIGHT KNEE - COMPLETE 4+ VIEW COMPARISON:  None Available. FINDINGS: Pelvis: Bilateral hip arthroplasties are intact were visualized. No periprosthetic fracture. No acute fracture of the pelvis. Pubic rami are intact. Femur: Periprosthetic fracture will be described below. The femoral shaft is intact. Knee: Knee arthroplasty is in place. Acute distal femur fracture extends to the femoral component with significant displacement. Osseous overriding is at least 5 cm. The tibial and femoral components appear congruent. IMPRESSION: 1. Acute displaced and overriding distal femur fracture extending to the femoral component of the knee arthroplasty. 2. No acute fracture of the pelvis.  Intact hip arthroplasties. Electronically Signed   By: Chadwick Colonel M.D.   On: 01/02/2024 14:54   DG Pelvis 1-2 Views Result Date: 01/02/2024 CLINICAL DATA:  Trip and fall with injury to the right lower extremity. EXAM: PELVIS - 1-2 VIEW; RIGHT FEMUR 2 VIEWS; RIGHT KNEE - COMPLETE 4+ VIEW COMPARISON:  None Available. FINDINGS: Pelvis: Bilateral hip arthroplasties are intact were visualized. No periprosthetic fracture. No acute fracture of the pelvis. Pubic rami are intact. Femur: Periprosthetic fracture will be described below. The femoral shaft is intact. Knee: Knee arthroplasty is in place. Acute distal femur fracture extends to the femoral component with significant displacement. Osseous overriding is at least 5 cm. The tibial and femoral components appear congruent. IMPRESSION: 1. Acute displaced and overriding distal femur fracture extending to the femoral component of the knee arthroplasty. 2. No acute fracture of the pelvis.  Intact hip arthroplasties. Electronically Signed   By: Chadwick Colonel M.D.   On: 01/02/2024 14:54    Procedures Procedures    Medications Ordered in ED Medications  fentaNYL  (SUBLIMAZE ) injection 50 mcg (50 mcg Intravenous Given 01/02/24  1357)  ondansetron  (ZOFRAN ) injection 4 mg (4 mg Intravenous Given 01/02/24 1433)  HYDROmorphone  (DILAUDID ) injection 1 mg (1 mg Intravenous Given 01/02/24 1433)  LORazepam  (ATIVAN ) injection 1 mg (1 mg Intravenous Given 01/02/24 1433)    ED Course/ Medical Decision Making/ A&P                                 Medical Decision Making Amount and/or Complexity of Data Reviewed Labs: ordered. Radiology: ordered.  Risk Prescription drug management. Decision regarding hospitalization.   Patient with obvious deformity of the distal right femur.  No other apparent injuries related to the fall.  Patient did not hit her head.  Patient was to stepping off the curb and went down kind of on that right knee.  BC pending.  Complete metabolic panel patient's GFR is 34 LFTs are normal creatinine 1.68.  BUN 26 electrolytes without significant abnormality blood sugar 154 anion gap up a little bit at 16 may be secondary to the fall.  Cortical chest x-ray no acute findings.  X-ray of the femur acute displaced and overriding distal femur fracture extending to the femoral component of the knee arthroplasty no acute fractures in the pelvis intact hip arthroplasties.  Will discuss with [facility].  Dr. Agatha Horsfall will come in to see patient.  Will contact hospitalist for admission.  Patient's CBC hemoglobin 11 complete metabolic panel other than creatinine of 1.6 as mentioned before.  Otherwise patient medically cleared.  EKG still pending.  Ordered again.  Final Clinical Impression(s) / ED Diagnoses Final diagnoses:  Fall, initial encounter  Closed fracture of distal end of right femur, unspecified fracture morphology, initial encounter (HCC)    Rx / DC  Orders ED Discharge Orders     None         Terrel Manalo, MD 01/02/24 1442    Nicklas Barns, MD 01/02/24 1505    Nicklas Barns, MD 01/02/24 [phone]

## 2024-01-03 ENCOUNTER — Encounter (HOSPITAL_COMMUNITY): Payer: Self-pay | Admitting: Student

## 2024-01-03 ENCOUNTER — Other Ambulatory Visit: Payer: Self-pay

## 2024-01-03 ENCOUNTER — Inpatient Hospital Stay (HOSPITAL_COMMUNITY): Admitting: Certified Registered"

## 2024-01-03 ENCOUNTER — Encounter (HOSPITAL_COMMUNITY)
Admission: EM | Disposition: A | Discharge status: Skilled Nursing Facility | Payer: Self-pay | Source: Home / Self Care | Attending: Internal Medicine

## 2024-01-03 ENCOUNTER — Inpatient Hospital Stay (HOSPITAL_COMMUNITY)

## 2024-01-03 DIAGNOSIS — I2510 Atherosclerotic heart disease of native coronary artery without angina pectoris: Secondary | ICD-10-CM

## 2024-01-03 DIAGNOSIS — Z87891 Personal history of nicotine dependence: Secondary | ICD-10-CM | POA: Diagnosis not present

## 2024-01-03 DIAGNOSIS — E1140 Type 2 diabetes mellitus with diabetic neuropathy, unspecified: Secondary | ICD-10-CM

## 2024-01-03 DIAGNOSIS — N179 Acute kidney failure, unspecified: Secondary | ICD-10-CM | POA: Diagnosis not present

## 2024-01-03 DIAGNOSIS — I10 Essential (primary) hypertension: Secondary | ICD-10-CM

## 2024-01-03 DIAGNOSIS — M9711XA Periprosthetic fracture around internal prosthetic right knee joint, initial encounter: Secondary | ICD-10-CM | POA: Diagnosis not present

## 2024-01-03 DIAGNOSIS — N1832 Chronic kidney disease, stage 3b: Secondary | ICD-10-CM | POA: Diagnosis not present

## 2024-01-03 DIAGNOSIS — S72451A Displaced supracondylar fracture without intracondylar extension of lower end of right femur, initial encounter for closed fracture: Secondary | ICD-10-CM

## 2024-01-03 HISTORY — PX: ORIF PERIPROSTHETIC FRACTURE: SHX5034

## 2024-01-03 LAB — BASIC METABOLIC PANEL WITH GFR
Anion gap: 9 (ref 5–15)
BUN: 24 mg/dL — ABNORMAL HIGH (ref 8–23)
CO2: 26 mmol/L (ref 22–32)
Calcium: 7.8 mg/dL — ABNORMAL LOW (ref 8.9–10.3)
Chloride: 104 mmol/L (ref 98–111)
Creatinine, Ser: 1.5 mg/dL — ABNORMAL HIGH (ref 0.44–1.00)
GFR, Estimated: 39 mL/min — ABNORMAL LOW (ref >60)
Glucose, Bld: 108 mg/dL — ABNORMAL HIGH (ref 70–99)
Potassium: 4.8 mmol/L (ref 3.5–5.1)
Sodium: 139 mmol/L (ref 135–145)

## 2024-01-03 LAB — CBC
HCT: 32.8 % — ABNORMAL LOW (ref 36.0–46.0)
HCT: 26.5 % — ABNORMAL LOW (ref 36.0–46.0)
Hemoglobin: 10.3 g/dL — ABNORMAL LOW (ref 12.0–15.0)
Hemoglobin: 8.3 g/dL — ABNORMAL LOW (ref 12.0–15.0)
MCH: 30.5 pg (ref 26.0–34.0)
MCH: 30.3 pg (ref 26.0–34.0)
MCHC: 31.4 g/dL (ref 30.0–36.0)
MCHC: 31.3 g/dL (ref 30.0–36.0)
MCV: 97 fL (ref 80.0–100.0)
MCV: 96.7 fL (ref 80.0–100.0)
Platelets: 190 10*3/uL (ref 150–400)
Platelets: 170 10*3/uL (ref 150–400)
RBC: 3.38 MIL/uL — ABNORMAL LOW (ref 3.87–5.11)
RBC: 2.74 MIL/uL — ABNORMAL LOW (ref 3.87–5.11)
RDW: 14.1 % (ref 11.5–15.5)
RDW: 13.8 % (ref 11.5–15.5)
WBC: 7.1 10*3/uL (ref 4.0–10.5)
WBC: 9.1 10*3/uL (ref 4.0–10.5)
nRBC: 0 % (ref 0.0–0.2)
nRBC: 0 % (ref 0.0–0.2)

## 2024-01-03 LAB — VITAMIN D 25 HYDROXY (VIT D DEFICIENCY, FRACTURES): Vit D, 25-Hydroxy: 52.28 ng/mL (ref 30–100)

## 2024-01-03 LAB — GLUCOSE, CAPILLARY
Glucose-Capillary: 105 mg/dL — ABNORMAL HIGH (ref 70–99)
Glucose-Capillary: 122 mg/dL — ABNORMAL HIGH (ref 70–99)
Glucose-Capillary: 155 mg/dL — ABNORMAL HIGH (ref 70–99)

## 2024-01-03 SURGERY — OPEN REDUCTION INTERNAL FIXATION (ORIF) PERIPROSTHETIC FRACTURE
Anesthesia: General | Laterality: Right

## 2024-01-03 MED ORDER — MIDAZOLAM HCL 2 MG/2ML IJ SOLN
INTRAMUSCULAR | Status: AC
  Filled 2024-01-03: qty 2

## 2024-01-03 MED ORDER — OXYCODONE HCL 5 MG/5ML PO SOLN: 5.0000 mg | Freq: Once | ORAL | Status: DC | PRN

## 2024-01-03 MED ORDER — CHLORHEXIDINE GLUCONATE 0.12 % MT SOLN: 15.0000 mL | Freq: Once | OROMUCOSAL | Status: AC

## 2024-01-03 MED ORDER — EPHEDRINE SULFATE-NACL 50-0.9 MG/10ML-% IV SOSY
PREFILLED_SYRINGE | INTRAVENOUS | Status: DC | PRN
  Administered 2024-01-03: 5 mg via INTRAVENOUS
  Administered 2024-01-03: 10 mg via INTRAVENOUS

## 2024-01-03 MED ORDER — LACTATED RINGERS IV BOLUS
1000.0000 mL | Freq: Once | INTRAVENOUS | Status: AC
  Administered 2024-01-03: 1000 mL via INTRAVENOUS

## 2024-01-03 MED ORDER — ORAL CARE MOUTH RINSE: 15.0000 mL | Freq: Once | OROMUCOSAL | Status: AC

## 2024-01-03 MED ORDER — TIZANIDINE HCL 4 MG PO TABS
4.0000 mg | ORAL_TABLET | Freq: Three times a day (TID) | ORAL | Status: DC
  Administered 2024-01-03 – 2024-01-10 (×19): 4 mg via ORAL
  Filled 2024-01-03 (×2): qty 2
  Filled 2024-01-03: qty 1
  Filled 2024-01-03: qty 2
  Filled 2024-01-03 (×2): qty 1
  Filled 2024-01-03 (×3): qty 2
  Filled 2024-01-03 (×4): qty 1
  Filled 2024-01-03: qty 2
  Filled 2024-01-03 (×2): qty 1
  Filled 2024-01-03: qty 2
  Filled 2024-01-03: qty 1
  Filled 2024-01-03: qty 2
  Filled 2024-01-03 (×5): qty 1
  Filled 2024-01-03: qty 2
  Filled 2024-01-03: qty 1
  Filled 2024-01-03: qty 2
  Filled 2024-01-03 (×2): qty 1
  Filled 2024-01-03: qty 2
  Filled 2024-01-03: qty 1
  Filled 2024-01-03: qty 2
  Filled 2024-01-03: qty 1
  Filled 2024-01-03: qty 2
  Filled 2024-01-03 (×2): qty 1
  Filled 2024-01-03: qty 2
  Filled 2024-01-03: qty 1
  Filled 2024-01-03 (×2): qty 2
  Filled 2024-01-03: qty 1

## 2024-01-03 MED ORDER — ACETAMINOPHEN 10 MG/ML IV SOLN: 1000.0000 mg | Freq: Once | INTRAVENOUS | Status: DC | PRN

## 2024-01-03 MED ORDER — 0.9 % SODIUM CHLORIDE (POUR BTL) OPTIME
TOPICAL | Status: DC | PRN
  Administered 2024-01-03: 1000 mL

## 2024-01-03 MED ORDER — LACTATED RINGERS IV SOLN: INTRAVENOUS | Status: DC

## 2024-01-03 MED ORDER — MIDAZOLAM HCL 2 MG/2ML IJ SOLN
INTRAMUSCULAR | Status: DC | PRN
  Administered 2024-01-03: 2 mg via INTRAVENOUS

## 2024-01-03 MED ORDER — CEFAZOLIN SODIUM-DEXTROSE 2-4 GM/100ML-% IV SOLN
2.0000 g | Freq: Three times a day (TID) | INTRAVENOUS | Status: AC
  Administered 2024-01-03 – 2024-01-04 (×3): 2 g via INTRAVENOUS
  Filled 2024-01-03 (×3): qty 100

## 2024-01-03 MED ORDER — FENTANYL CITRATE (PF) 250 MCG/5ML IJ SOLN
INTRAMUSCULAR | Status: AC
  Filled 2024-01-03: qty 5

## 2024-01-03 MED ORDER — PHENYLEPHRINE 80 MCG/ML (10ML) SYRINGE FOR IV PUSH (FOR BLOOD PRESSURE SUPPORT)
PREFILLED_SYRINGE | INTRAVENOUS | Status: DC | PRN
  Administered 2024-01-03 (×2): 80 ug via INTRAVENOUS
  Administered 2024-01-03: 160 ug via INTRAVENOUS

## 2024-01-03 MED ORDER — SODIUM CHLORIDE 0.9 % IV BOLUS
500.0000 mL | Freq: Once | INTRAVENOUS | Status: AC
  Administered 2024-01-03: 500 mL via INTRAVENOUS

## 2024-01-03 MED ORDER — OXYCODONE HCL 5 MG PO TABS: 5.0000 mg | ORAL_TABLET | Freq: Once | ORAL | Status: DC | PRN

## 2024-01-03 MED ORDER — METOCLOPRAMIDE HCL 5 MG/ML IJ SOLN: 5.0000 mg | Freq: Three times a day (TID) | INTRAMUSCULAR | Status: DC | PRN

## 2024-01-03 MED ORDER — ONDANSETRON HCL 4 MG/2ML IJ SOLN: 4.0000 mg | Freq: Once | INTRAMUSCULAR | Status: DC | PRN

## 2024-01-03 MED ORDER — FENTANYL CITRATE (PF) 250 MCG/5ML IJ SOLN
INTRAMUSCULAR | Status: DC | PRN
Start: 2024-01-03 — End: 2024-01-03
  Administered 2024-01-03 (×2): 50 ug via INTRAVENOUS

## 2024-01-03 MED ORDER — OXYCODONE HCL 5 MG PO TABS
5.0000 mg | ORAL_TABLET | ORAL | Status: DC | PRN
  Administered 2024-01-03 (×2): 10 mg via ORAL
  Administered 2024-01-06 – 2024-01-08 (×4): 5 mg via ORAL
  Administered 2024-01-09 – 2024-01-10 (×4): 10 mg via ORAL
  Filled 2024-01-03: qty 2
  Filled 2024-01-03: qty 1
  Filled 2024-01-03 (×2): qty 2
  Filled 2024-01-03: qty 1
  Filled 2024-01-03 (×2): qty 2
  Filled 2024-01-03 (×2): qty 1
  Filled 2024-01-03: qty 2

## 2024-01-03 MED ORDER — HYDROMORPHONE HCL 1 MG/ML IJ SOLN
INTRAMUSCULAR | Status: AC
  Filled 2024-01-03: qty 1

## 2024-01-03 MED ORDER — CHLORHEXIDINE GLUCONATE 0.12 % MT SOLN
OROMUCOSAL | Status: AC
  Administered 2024-01-03: 15 mL via OROMUCOSAL
  Filled 2024-01-03: qty 15

## 2024-01-03 MED ORDER — METOCLOPRAMIDE HCL 5 MG PO TABS: 5.0000 mg | ORAL_TABLET | Freq: Three times a day (TID) | ORAL | Status: DC | PRN

## 2024-01-03 MED ORDER — LIDOCAINE 2% (20 MG/ML) 5 ML SYRINGE
INTRAMUSCULAR | Status: DC | PRN
  Administered 2024-01-03: 100 mg via INTRAVENOUS

## 2024-01-03 MED ORDER — SUGAMMADEX SODIUM 200 MG/2ML IV SOLN
INTRAVENOUS | Status: DC | PRN
  Administered 2024-01-03: 200 mg via INTRAVENOUS

## 2024-01-03 MED ORDER — TRANEXAMIC ACID-NACL 1000-0.7 MG/100ML-% IV SOLN
1000.0000 mg | Freq: Once | INTRAVENOUS | Status: AC
  Administered 2024-01-03: 1000 mg via INTRAVENOUS
  Filled 2024-01-03: qty 100

## 2024-01-03 MED ORDER — ALBUMIN HUMAN 5 % IV SOLN: INTRAVENOUS | Status: DC | PRN

## 2024-01-03 MED ORDER — VANCOMYCIN HCL 1000 MG IV SOLR
INTRAVENOUS | Status: AC
  Filled 2024-01-03: qty 20

## 2024-01-03 MED ORDER — ONDANSETRON HCL 4 MG/2ML IJ SOLN
INTRAMUSCULAR | Status: DC | PRN
Start: 2024-01-03 — End: 2024-01-03
  Administered 2024-01-03: 4 mg via INTRAVENOUS

## 2024-01-03 MED ORDER — ROCURONIUM BROMIDE 10 MG/ML (PF) SYRINGE
PREFILLED_SYRINGE | INTRAVENOUS | Status: DC | PRN
  Administered 2024-01-03: 60 mg via INTRAVENOUS
  Administered 2024-01-03: 20 mg via INTRAVENOUS

## 2024-01-03 MED ORDER — HYDRALAZINE HCL 10 MG PO TABS: 10.0000 mg | ORAL_TABLET | Freq: Four times a day (QID) | ORAL | Status: DC | PRN

## 2024-01-03 MED ORDER — PROPOFOL 10 MG/ML IV BOLUS
INTRAVENOUS | Status: DC | PRN
  Administered 2024-01-03: 100 mg via INTRAVENOUS

## 2024-01-03 MED ORDER — HYDROMORPHONE HCL 1 MG/ML IJ SOLN
0.2500 mg | INTRAMUSCULAR | Status: DC | PRN
  Administered 2024-01-03: 0.25 mg via INTRAVENOUS
  Administered 2024-01-03: 0.5 mg via INTRAVENOUS
  Administered 2024-01-03: 0.25 mg via INTRAVENOUS

## 2024-01-03 MED ORDER — DEXAMETHASONE SODIUM PHOSPHATE 10 MG/ML IJ SOLN
INTRAMUSCULAR | Status: DC | PRN
  Administered 2024-01-03: 10 mg via INTRAVENOUS

## 2024-01-03 MED ORDER — VANCOMYCIN HCL 1000 MG IV SOLR
INTRAVENOUS | Status: DC | PRN
  Administered 2024-01-03: 1000 mg

## 2024-01-03 MED ORDER — ENOXAPARIN SODIUM 40 MG/0.4ML IJ SOSY
40.0000 mg | PREFILLED_SYRINGE | INTRAMUSCULAR | Status: DC
  Administered 2024-01-04 – 2024-01-10 (×7): 40 mg via SUBCUTANEOUS
  Filled 2024-01-03 (×7): qty 0.4

## 2024-01-03 MED ORDER — ALBUTEROL SULFATE HFA 108 (90 BASE) MCG/ACT IN AERS
INHALATION_SPRAY | RESPIRATORY_TRACT | Status: DC | PRN
  Administered 2024-01-03: 2 via RESPIRATORY_TRACT

## 2024-01-03 MED ORDER — DIPHENHYDRAMINE HCL 12.5 MG/5ML PO ELIX: 12.5000 mg | ORAL_SOLUTION | ORAL | Status: DC | PRN

## 2024-01-03 MED ORDER — PHENYLEPHRINE HCL-NACL 20-0.9 MG/250ML-% IV SOLN
INTRAVENOUS | Status: DC | PRN
Start: 2024-01-03 — End: 2024-01-03
  Administered 2024-01-03: 50 ug/min via INTRAVENOUS

## 2024-01-03 MED ORDER — AMISULPRIDE (ANTIEMETIC) 5 MG/2ML IV SOLN: 10.0000 mg | Freq: Once | INTRAVENOUS | Status: DC | PRN

## 2024-01-03 MED ORDER — LACTATED RINGERS IV SOLN: INTRAVENOUS | Status: DC | PRN

## 2024-01-03 SURGICAL SUPPLY — 50 items
BAG COUNTER SPONGE SURGICOUNT (BAG) ×1 IMPLANT
BIT DRILL 4.3X300MM (BIT) IMPLANT
BIT DRILL LONG 3.3 (BIT) IMPLANT
BIT DRILL QC 3.3X195 (BIT) IMPLANT
BNDG COHESIVE 6X5 TAN ST LF (GAUZE/BANDAGES/DRESSINGS) ×1 IMPLANT
BNDG ELASTIC 6X10 VLCR STRL LF (GAUZE/BANDAGES/DRESSINGS) ×1 IMPLANT
BRUSH SCRUB EZ PLAIN DRY (MISCELLANEOUS) ×2 IMPLANT
CANISTER SUCTION 3000ML PPV (SUCTIONS) ×1 IMPLANT
CAP LOCK NCB (Cap) IMPLANT
CHLORAPREP W/TINT 26 (MISCELLANEOUS) ×1 IMPLANT
COVER SURGICAL LIGHT HANDLE (MISCELLANEOUS) ×1 IMPLANT
DRAPE C-ARM 42X72 X-RAY (DRAPES) ×1 IMPLANT
DRAPE C-ARMOR (DRAPES) ×1 IMPLANT
DRAPE HALF SHEET 40X57 (DRAPES) ×2 IMPLANT
DRAPE SURG 17X23 STRL (DRAPES) ×1 IMPLANT
DRAPE SURG ORHT 6 SPLT 77X108 (DRAPES) ×2 IMPLANT
DRAPE U-SHAPE 47X51 STRL (DRAPES) ×1 IMPLANT
DRESSING MEPILEX FLEX 4X4 (GAUZE/BANDAGES/DRESSINGS) IMPLANT
DRSG MEPILEX POST OP 4X8 (GAUZE/BANDAGES/DRESSINGS) IMPLANT
ELECTRODE REM PT RTRN 9FT ADLT (ELECTROSURGICAL) ×1 IMPLANT
GAUZE PAD ABD 8X10 STRL (GAUZE/BANDAGES/DRESSINGS) ×3 IMPLANT
GAUZE SPONGE 4X4 12PLY STRL (GAUZE/BANDAGES/DRESSINGS) ×1 IMPLANT
GLOVE BIO SURGEON STRL SZ 6.5 (GLOVE) ×3 IMPLANT
GLOVE BIO SURGEON STRL SZ7.5 (GLOVE) ×4 IMPLANT
GLOVE BIOGEL PI IND STRL 6.5 (GLOVE) ×1 IMPLANT
GLOVE BIOGEL PI IND STRL 7.5 (GLOVE) ×1 IMPLANT
GOWN STRL REUS W/ TWL LRG LVL3 (GOWN DISPOSABLE) ×3 IMPLANT
KIT BASIN OR (CUSTOM PROCEDURE TRAY) ×1 IMPLANT
KIT TURNOVER KIT B (KITS) ×1 IMPLANT
KWIRE FXSTD 280X2XNS SS (WIRE) IMPLANT
NS IRRIG 1000ML POUR BTL (IV SOLUTION) ×1 IMPLANT
PACK TOTAL JOINT (CUSTOM PROCEDURE TRAY) ×1 IMPLANT
PAD ARMBOARD POSITIONER FOAM (MISCELLANEOUS) ×1 IMPLANT
PAD CAST 4YDX4 CTTN HI CHSV (CAST SUPPLIES) ×1 IMPLANT
PADDING CAST ABS COTTON 4X4 ST (CAST SUPPLIES) IMPLANT
PADDING CAST ABS COTTON 6X4 NS (CAST SUPPLIES) IMPLANT
PADDING CAST COTTON 6X4 STRL (CAST SUPPLIES) ×1 IMPLANT
PLATE DISTAL FEMUR 15H 317M RT (Plate) IMPLANT
SCREW 5.0 80MM (Screw) IMPLANT
SCREW CANN NCB PA 6.2X4.4/5 (Screw) IMPLANT
SCREW NCB 4.0MX34M (Screw) IMPLANT
SCREW NCB 5.0X34MM (Screw) IMPLANT
SCREW NCB 5.0X38 (Screw) IMPLANT
STAPLER VISISTAT 35W (STAPLE) ×1 IMPLANT
SUCTION TUBE FRAZIER 10FR DISP (SUCTIONS) ×1 IMPLANT
SUT ETHILON 3 0 PS 1 (SUTURE) ×2 IMPLANT
SUT VIC AB 1 CT1 27XBRD ANBCTR (SUTURE) IMPLANT
SUT VIC AB 2-0 CT1 TAPERPNT 27 (SUTURE) ×2 IMPLANT
TOWEL GREEN STERILE (TOWEL DISPOSABLE) ×2 IMPLANT
WATER STERILE IRR 1000ML POUR (IV SOLUTION) ×2 IMPLANT

## 2024-01-03 NOTE — Op Note (Signed)
 Orthopaedic Surgery Operative Note (CSN: [id number] ) Date of Surgery: 01/03/2024  Admit Date: 01/02/2024   Diagnoses: Pre-Op Diagnoses: Right supracondylar distal femur fracture  Post-Op Diagnosis: Same  Procedures: CPT 27511-Open reduction internal fixation of right supracondylar distal femur fracture  Surgeons : Primary: Laneta Pintos, MD  Assistant: Alona Jamaica, PA-C  Location: OR 3   Anesthesia: General   Antibiotics: Ancef  2g preop with 1 gm vancomycin powder placed topically   Tourniquet time: None    Estimated Blood Loss: 200 mL  Complications: None   Specimens:* No specimens in log *   Implants: Implant Name Type Inv. Item Serial No. Manufacturer Lot No. LRB No. Used Action  CAP LOCK NCB - [id number] Cap CAP LOCK NCB  [employer](ORTH,TRAU,BIO,SG)  Right 8 Implanted  PLATE DISTAL FEMUR 15H 317M RT - [id number] Plate PLATE DISTAL FEMUR 15H 317M RT  [employer](ORTH,TRAU,BIO,SG)  Right 1 Implanted  SCREW 5.0 - [id number] Screw SCREW 5.0  [employer](ORTH,TRAU,BIO,SG)  Right 5 Implanted  SCREW NCB 5.0X34MM - [id number] Screw SCREW NCB 5.0X34MM  [employer](ORTH,TRAU,BIO,SG)  Right 2 Implanted  SCREW NCB 4.0MX34M - [id number] Screw SCREW NCB 4.0MX34M  [employer](ORTH,TRAU,BIO,SG)  Right 1 Implanted  SCREW CANN NCB PA 6.2X4.4/5 - [id number] Screw SCREW CANN NCB PA 6.2X4.4/5  [employer](ORTH,TRAU,BIO,SG)  Right 2 Implanted  SCREW CANN NCB PA 6.2X4.4/5 - [id number] Screw SCREW CANN NCB PA 6.2X4.4/5  [employer](ORTH,TRAU,BIO,SG)  Right 2 Implanted     Indications for Surgery: 63 year old female who sustained a ground-level fall with a periprosthetic supracondylar femur fracture.  Due to the unstable nature of her injury I recommend proceeding with open reduction internal fixation.  Risks and benefits were discussed with the patient.  Risks included but not limited to bleeding, infection, malunion, nonunion, hardware failure, hardware irritation,  nerve and blood vessel injury, DVT, knee stiffness, even the possibility anesthetic complications.  She agreed to proceed with surgery and consent was obtained.  Operative Findings: 1.  Open reduction internal fixation of right supracondylar periprosthetic distal femur fracture using [employer] NCB distal femoral locking plate.  Procedure: The patient was identified in the preoperative holding area. Consent was confirmed with the patient and their family and all questions were answered. The operative extremity was marked after confirmation with the patient. she was then brought back to the operating room by our anesthesia colleagues.  She was placed under general anesthetic and carefully transferred over to radiolucent flattop table.  A bump was placed under her operative hip.  The right lower extremity was then prepped and draped in usual sterile fashion.  A timeout was performed to verify the patient, the procedure, and the extremity.  Preoperative antibiotics were dosed.  The hip and knee were flexed over a triangle and fluoroscopic imaging showed the unstable nature of her injury.  Traction was applied and the triangle was adjusted to get AP and lateral views aligned appropriately.  A lateral approach to the distal femur was made and carried down through skin and subcutaneous tissue.  I incised through the IT band and expose the lateral condyle of the femur.  I used a Cobb to develop a path underneath the vastus lateralis.  I then used a [employer] NCB distal femoral locking plate and slid this submuscularly along the lateral cortex of the femur.  I held the distal portion aligned with a 2.0 mm guidewire.  A 3.3 mm drill bit was placed bicortically just below the total hip prosthesis.  I  confirmed the position of the plate and then placed 5.0 millimeter screws distally to bring the plate flush to bone.  2 of the screws were placed.  I then percutaneously placed 5.0 millimeter screws in the femoral  shaft.  I removed the 3.3 mm drill bit and placed a 4.0 millimeter screw.  I then placed unicortical 5.0 millimeter screws proximally to prevent a stress riser along the prosthesis.  Locking caps were placed on the 4.0 millimeter screw as well as the unicortical locking screws.  The targeting arm was removed and I focused on the distal portion of the femur.  I placed 3 more 5.0 millimeter screws and placed locking caps in all the distal screws.  Final fluoroscopic imaging was obtained.  The incisions were copiously irrigated.  1 g of vancomycin powder was placed into the incisions.  A layered closure was used with 0 Vicryl, 2-0 Monocryl and 3-0 nylon.  Sterile dressings were applied.  The patient was then awoke from anesthesia and taken to the PACU in stable condition.  Post Op Plan/Instructions: The patient be weightbearing as tolerated to the right lower extremity.  She will receive postoperative Ancef .  She will receive Lovenox  for DVT prophylaxis and discharged on oral DOAC.  Will have her mobilize with physical and Occupational Therapy.  I was present and performed the entire surgery.  Alona Jamaica, PA-C did assist me throughout the case. An assistant was necessary given the difficulty in approach, maintenance of reduction and ability to instrument the fracture.   Katheryne Pane, MD [facility]

## 2024-01-03 NOTE — Assessment & Plan Note (Signed)
Body mass index is 36.58 kg/m.

## 2024-01-03 NOTE — Progress Notes (Signed)
   01/03/24 1013  Vitals  BP (!) 127/45  MAP (mmHg) 68  BP Location Right Arm  BP Method Automatic  Patient Position (if appropriate) Lying  Pulse Rate 71  Pulse Rate Source Dinamap  Resp 16  Level of Consciousness  Level of Consciousness Alert  MEWS COLOR  MEWS Score Color Green  Oxygen Therapy  SpO2 92 %  O2 Device Nasal Cannula  O2 Flow Rate (L/min) 3 L/min  Pain Assessment  Pain Scale 0-10  Pain Score 5  Pain Type Surgical pain  Pain Location Leg  Pain Orientation Right  Pain Descriptors / Indicators Aching  Patients Stated Pain Goal 2  Pain Intervention(s) Medication (See eMAR)  MEWS Score  MEWS Temp 0  MEWS Systolic 0  MEWS Pulse 0  MEWS RR 0  MEWS LOC 0  MEWS Score 0

## 2024-01-03 NOTE — Progress Notes (Signed)
   01/03/24 0023  Assess: MEWS Score  BP (!) 77/40  MAP (mmHg) (!) 52  Pulse Rate (!) 54  Assess: MEWS Score  MEWS Temp 0  MEWS Systolic 2  MEWS Pulse 0  MEWS RR 0  MEWS LOC 0  MEWS Score 2  MEWS Score Color Yellow  Assess: if the MEWS score is Yellow or Red  Were vital signs accurate and taken at a resting state? Yes  Does the patient meet 2 or more of the SIRS criteria? No  MEWS guidelines implemented  Yes, yellow  Treat  MEWS Interventions Considered administering scheduled or prn medications/treatments as ordered  Take Vital Signs  Increase Vital Sign Frequency  Yellow: Q2hr x1, continue Q4hrs until patient remains green for 12hrs  Escalate  MEWS: Escalate Yellow: Discuss with charge nurse and consider notifying provider and/or RRT  Notify: Charge Nurse/RN  Name of Charge Nurse/RN Notified [facility]  Provider Notification  Provider Name/Title J.Sharion Davidson  Date Provider Notified 01/03/24  Method of Notification Page (secured chat)  Notification Reason Other (Comment) (low bp)  Provider response See new orders  Assess: SIRS CRITERIA  SIRS Temperature  0  SIRS Respirations  0  SIRS Pulse 0  SIRS WBC 0  SIRS Score Sum  0

## 2024-01-03 NOTE — Assessment & Plan Note (Signed)
 01-03-2024 baseline Scr 1.4   01-04-2024 Scr at baseline of 1.43. BUN 18

## 2024-01-03 NOTE — Assessment & Plan Note (Signed)
 01-03-2024 continue IVF. baseline Scr 1.4   01-04-2024 continue IVF due to hypotension.

## 2024-01-03 NOTE — Assessment & Plan Note (Signed)
 01-03-2024 sustained right periprosthetic fracture. S/p ORIF today.  01-04-2024 stable. Needs PT eval. May need SNF placement.

## 2024-01-03 NOTE — Interval H&P Note (Signed)
 History and Physical Interval Note:  01/03/2024 7:35 AM  Valerie [name]  has presented today for surgery, with the diagnosis of RIGHT PERI-PROSTHETIC FRACTURE.  The various methods of treatment have been discussed with the patient and family. After consideration of risks, benefits and other options for treatment, the patient has consented to  Procedure(s): OPEN REDUCTION INTERNAL FIXATION (ORIF) PERIPROSTHETIC FRACTURE (Right) as a surgical intervention.  The patient's history has been reviewed, patient examined, no change in status, stable for surgery.  I have reviewed the patient's chart and labs.  Questions were answered to the patient's satisfaction.     Sybilla Malhotra P Ketara Cavness

## 2024-01-03 NOTE — Progress Notes (Signed)
 Pt arrived back to 6 north room 31 from surgery alert and oriented x4.pain level 5/10 in right leg. Right leg wrapped with ace wrap from above the knee to ankle. Dressing is clean dry and intact. Bed in lowest position. Call light in reach. Bed alarm on. Breakfast tray ordered. All needs met at this time.

## 2024-01-03 NOTE — Transfer of Care (Signed)
 Immediate Anesthesia Transfer of Care Note  [name]  Procedure(s) Performed: OPEN REDUCTION INTERNAL FIXATION (ORIF) PERIPROSTHETIC FRACTURE (Right)  Patient Location: PACU  Anesthesia Type:General  Level of Consciousness: awake  Airway & Oxygen Therapy: Patient Spontanous Breathing and Patient connected to face mask oxygen  Post-op Assessment: Report given to RN and Post -op Vital signs reviewed and stable  Post vital signs: Reviewed and stable  Last Vitals:  Vitals Value Taken Time  BP 131/69 01/03/24 0902  Temp    Pulse 66 01/03/24 0907  Resp 14 01/03/24 0907  SpO2 89 % 01/03/24 0907  Vitals shown include unfiled device data.  Last Pain:  Vitals:   01/03/24 0714  TempSrc:   PainSc: 10-Worst pain ever         Complications: No notable events documented.

## 2024-01-03 NOTE — Consult Note (Signed)
 Orthopaedic Trauma Service (OTS) Consult   Patient ID: Valerie [name] MRN: [id number] DOB/AGE: 10/23/1960 63 y.o.  Reason for Consult:Right supracondylar distal femur fracture Referring Physician: Dr. Lanis Pitcher, MD [facility]  HPI: Valerie [name] is an 63 y.o. female who is being seen in consultation at the request of Dr. Agatha Horsfall for evaluation of right distal femur fracture.  Patient had a total knee arthroplasty with Dr. Agatha Horsfall on February 25 of this year.  She was doing well still walking with a cane.  She tripped over a curb landing on her leg had immediate pain and deformity.  She lives alone but she has her sister and daughter who can assist as well as other friends upon potential discharge.  She is complaining of significant pain in her right leg.  She is scheduled to have lumbar surgery later this month.  She states that she has peripheral neuropathy and that has caused issues with her balance.  She is diabetic but her hemoglobin A1c has been under 6 for the last 5 years.  She currently does not take any medications.  She has quit smoking but does vape occasionally.  Denies any other issues of note.  Past Medical History:  Diagnosis Date   Anemia    Anxiety    Arthritis    Psoriatic   Bipolar disorder (HCC)    CKD (chronic kidney disease), stage III (HCC)    Colovesical fistula    Dementia (HCC)    Depression    DM type 2 (diabetes mellitus, type 2) (HCC) 11/14/2019   Dyspnea    E coli bacteremia 07/28/2023   Headache    Hyperthyroidism    Neuromuscular disorder (HCC)    Neuropathy    NSTEMI (non-ST elevated myocardial infarction) (HCC) 11/14/2019   OSA (obstructive sleep apnea)    Primary localized osteoarthritis of right knee 10/19/2023   Psoriatic arthritis (HCC)    Pyelonephritis 07/23/2023   Sleep apnea    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   Stroke (HCC)    ?history; MRI normal    Past Surgical History:  Procedure Laterality Date   ABDOMINAL  HYSTERECTOMY     APPENDECTOMY     CHOLECYSTECTOMY     CYSTOSCOPY WITH STENT PLACEMENT N/A 07/17/2020   Procedure: CYSTOSCOPY WITH BILATERAL FIREFLY INJECTION;  Surgeon: Florencio Hunting, MD;  Location: [facility];  Service: Urology;  Laterality: N/A;   FLEXIBLE SIGMOIDOSCOPY N/A 05/17/2020   Procedure: FLEXIBLE SIGMOIDOSCOPY;  Surgeon: Alvis Jourdain, MD;  Location: [facility];  Service: Endoscopy;  Laterality: N/A;   FOOT SURGERY Right    IR FLUORO GUIDE CV LINE RIGHT  08/19/2020   IR RADIOLOGIST EVAL & MGMT  09/10/2020   IR RADIOLOGIST EVAL & MGMT  10/10/2020   IR REMOVAL TUN CV CATH W/O FL  09/16/2020   IR SINUS/FIST TUBE CHK-NON GI  10/24/2020   IR SINUS/FIST TUBE CHK-NON GI  10/31/2020   IR US  GUIDE VASC ACCESS RIGHT  08/19/2020   LEFT HEART CATH AND CORONARY ANGIOGRAPHY N/A 11/14/2019   Procedure: LEFT HEART CATH AND CORONARY ANGIOGRAPHY;  Surgeon: Swazil[place], Peter M, MD;  Location: [facility] INVASIVE CV LAB;  Service: Cardiovascular;  Laterality: N/A;   PARATHYROID  EXPLORATION N/A 01/09/2021   Procedure: POSSIBLE NECK EXPLORATION;  Surgeon: Oralee Billow, MD;  Location: [facility];  Service: General;  Laterality: N/A;   PARATHYROIDECTOMY Left 01/09/2021   Procedure: LEFT INFERIOR PARATHYROIDECTOMY;  Surgeon: Oralee Billow, MD;  Location: [facility];  Service: General;  Laterality: Left;  PROCTOSCOPY N/A 07/17/2020   Procedure: RIGID PROCTOSCOPY;  Surgeon: Candyce Champagne, MD;  Location: [facility];  Service: General;  Laterality: N/A;   TONSILLECTOMY     TOTAL HIP ARTHROPLASTY Bilateral    TOTAL KNEE ARTHROPLASTY Right 10/19/2023   Procedure: TOTAL KNEE ARTHROPLASTY;  Surgeon: Osa Blase, MD;  Location: [facility];  Service: Orthopedics;  Laterality: Right;    Family History  Problem Relation Age of Onset   Breast cancer Neg Hx     Social History:  reports that she has quit smoking. Her smoking use included cigarettes. She has been exposed to tobacco smoke. She has never used smokeless tobacco. She reports current  alcohol use. She reports that she does not use drugs.  Allergies:  Allergies  Allergen Reactions   Carbamazepine Other (See Comments)    Parkinsons like symptoms tremors   Sertraline Hcl Other (See Comments)    Parkinsons like symptoms    Medications:  No current facility-administered medications on file prior to encounter.   Current Outpatient Medications on File Prior to Encounter  Medication Sig Dispense Refill   ALPRAZolam  (XANAX ) 0.5 MG tablet Take 0.5 tablets (0.25 mg total) by mouth 3 (three) times daily as needed for anxiety. (Patient taking differently: Take 0.5 mg by mouth 3 (three) times daily as needed for anxiety.) 5 tablet 0   aspirin  EC 81 MG tablet Take 81 mg by mouth daily. Swallow whole.     carbidopa -levodopa  (SINEMET  IR) 25-100 MG tablet Take 1 tablet by mouth in the morning and at bedtime.     Docusate Calcium  (STOOL SOFTENER PO) Take 1 tablet by mouth in the morning and at bedtime.     donepezil  (ARICEPT ) 10 MG tablet Take 10 mg by mouth daily.     ENBREL 50 MG/ML injection Inject 50 mg into the skin See admin instructions. Take 50 mg 2 times a week for 4 weeks take 50 mg once  a week     fluconazole  (DIFLUCAN ) 150 MG tablet Take 150 mg by mouth every 3 (three) days.     FLUoxetine  (PROZAC ) 40 MG capsule Take 1 capsule (40 mg total) by mouth daily. (Patient taking differently: Take 80 mg by mouth daily.)     gabapentin  (NEURONTIN ) 300 MG capsule Take 300-600 mg by mouth See admin instructions. Take 300 mg in the morning and 600 mg at night     lamoTRIgine  (LAMICTAL ) 150 MG tablet Take 1 tablet by mouth 2 (two) times daily.     Lecanemab-irmb (LEQEMBI IV) Inject into the vein every 14 (fourteen) days.     levETIRAcetam  (KEPPRA ) 750 MG tablet Take 750 mg by mouth 2 (two) times daily.     levocetirizine (XYZAL ) 5 MG tablet Take 1 tablet (5 mg total) by mouth every evening. 90 tablet 3   meclizine  (ANTIVERT ) 25 MG tablet Take 25 mg by mouth 3 (three) times daily as  needed for dizziness.     memantine  (NAMENDA ) 10 MG tablet Take 10 mg by mouth 2 (two) times daily.     Multiple Vitamins-Minerals (MULTIVITAMIN WITH MINERALS) tablet Take 1 tablet by mouth at bedtime.     nitroGLYCERIN  (NITROSTAT ) 0.4 MG SL tablet Place 1 tablet (0.4 mg total) under the tongue every 5 (five) minutes as needed for chest pain. 10 tablet 2   Omega-3 Fatty Acids (FISH OIL PO) Take 2 capsules by mouth daily.     omeprazole  (PRILOSEC) 40 MG capsule Take 1 capsule (40 mg total) by mouth daily. 90  capsule 3   ondansetron  (ZOFRAN ) 8 MG tablet Take 1 tablet (8 mg total) by mouth every 8 (eight) hours as needed for nausea or vomiting. 20 tablet 0   oxybutynin  (DITROPAN ) 5 MG tablet Take 5 mg by mouth 2 (two) times daily.     oxyCODONE  (OXY IR/ROXICODONE ) 5 MG immediate release tablet Take 5 mg by mouth every 4 (four) hours as needed for severe pain (pain score 7-10).     predniSONE  (DELTASONE ) 5 MG tablet TAKE 1 TABLET(5 MG) BY MOUTH DAILY WITH BREAKFAST 90 tablet 1   primidone  (MYSOLINE ) 50 MG tablet Take 100 mg by mouth 2 (two) times daily.     rizatriptan  (MAXALT ) 10 MG tablet Take 10 mg by mouth as needed for migraine. May repeat in 2 hours if needed     rosuvastatin  (CRESTOR ) 10 MG tablet Take 1 tablet (10 mg total) by mouth daily. 90 tablet 3   sennosides-docusate sodium  (SENOKOT-S) 8.6-50 MG tablet Take 2 tablets by mouth daily. 30 tablet 1   tiZANidine (ZANAFLEX) 4 MG tablet 1 tablet Orally four times a day for 30 days As needed     traZODone  (DESYREL ) 100 MG tablet Take 2 tablets (200 mg total) by mouth at bedtime. 60 tablet 0   baclofen  (LIORESAL ) 10 MG tablet Take 1 tablet (10 mg total) by mouth 3 (three) times daily. As needed for muscle spasm (Patient not taking: Reported on 01/02/2024) 50 tablet 0   polyethylene glycol (MIRALAX  / GLYCOLAX ) 17 g packet Take 17 g by mouth 2 (two) times daily. (Patient not taking: Reported on 01/02/2024)       ROS: Constitutional: No fever or  chills Vision: No changes in vision ENT: No difficulty swallowing CV: No chest pain Pulm: No SOB or wheezing GI: No nausea or vomiting GU: No urgency or inability to hold urine Skin: No poor wound healing Neurologic: No numbness or tingling Psychiatric: No depression or anxiety Heme: No bruising Allergic: No reaction to medications or food   Exam: Blood pressure 123/73, pulse (!) 57, temperature 99 F (37.2 C), temperature source Oral, resp. rate 17, height 5\' 2"  (1.575 m), weight 90.7 kg, SpO2 92%. General: No acute distress Orientation: Awake alert and oriented x 3 Mood and Affect: Cooperative and pleasant Gait: Unable to assess due to her fracture Coordination and balance: Within normal limits  Right lower extremity: Knee immobilizer is in place.  Obvious deformity through the distal femur.  Compartments are soft compressible.  Active dorsiflexion plantarflexion of foot and ankle.  She has sensation intact to light touch to all nerve distributions.  Warm well-perfused foot.  Left lower extremity: Skin without lesions. No tenderness to palpation. Full painless ROM, full strength in each muscle groups without evidence of instability.   Medical Decision Making: Data: Imaging: X-rays of the right femur and knee are reviewed which shows a periprosthetic distal femur fracture with significant displacement.  She has a previous total hip arthroplasty in place.  Labs:  Results for orders placed or performed during the hospital encounter of 01/02/24 (from the past 24 hours)  CBC with Differential/Platelet     Status: Abnormal   Collection Time: 01/02/24  2:19 PM  Result Value Ref Range   WBC 9.3 4.0 - 10.5 K/uL   RBC 3.80 (L) 3.87 - 5.11 MIL/uL   Hemoglobin 11.3 (L) 12.0 - 15.0 g/dL   HCT 16.1 09.6 - 04.5 %   MCV 99.5 80.0 - 100.0 fL   MCH 29.7 26.0 -  34.0 pg   MCHC 29.9 (L) 30.0 - 36.0 g/dL   RDW 16.1 09.6 - 04.5 %   Platelets 237 150 - 400 K/uL   nRBC 0.0 0.0 - 0.2 %    Neutrophils Relative % 78 %   Neutro Abs 7.4 1.7 - 7.7 K/uL   Lymphocytes Relative 9 %   Lymphs Abs 0.8 0.7 - 4.0 K/uL   Monocytes Relative 7 %   Monocytes Absolute 0.6 0.1 - 1.0 K/uL   Eosinophils Relative 4 %   Eosinophils Absolute 0.3 0.0 - 0.5 K/uL   Basophils Relative 1 %   Basophils Absolute 0.1 0.0 - 0.1 K/uL   Immature Granulocytes 1 %   Abs Immature Granulocytes 0.05 0.00 - 0.07 K/uL  Comprehensive metabolic panel with GFR     Status: Abnormal   Collection Time: 01/02/24  2:19 PM  Result Value Ref Range   Sodium 139 135 - 145 mmol/L   Potassium 4.0 3.5 - 5.1 mmol/L   Chloride 104 98 - 111 mmol/L   CO2 19 (L) 22 - 32 mmol/L   Glucose, Bld 154 (H) 70 - 99 mg/dL   BUN 26 (H) 8 - 23 mg/dL   Creatinine, Ser 4.09 (H) 0.44 - 1.00 mg/dL   Calcium  8.6 (L) 8.9 - 10.3 mg/dL   Total Protein 6.5 6.5 - 8.1 g/dL   Albumin 3.6 3.5 - 5.0 g/dL   AST 23 15 - 41 U/L   ALT 6 0 - 44 U/L   Alkaline Phosphatase 91 38 - 126 U/L   Total Bilirubin 0.5 0.0 - 1.2 mg/dL   GFR, Estimated 34 (L) >60 mL/min   Anion gap 16 (H) 5 - 15  Glucose, capillary     Status: Abnormal   Collection Time: 01/02/24  9:04 PM  Result Value Ref Range   Glucose-Capillary 104 (H) 70 - 99 mg/dL  Urinalysis, Routine w reflex microscopic -Urine, Clean Catch     Status: Abnormal   Collection Time: 01/02/24  9:35 PM  Result Value Ref Range   Color, Urine YELLOW YELLOW   APPearance CLEAR CLEAR   Specific Gravity, Urine 1.012 1.005 - 1.030   pH 5.0 5.0 - 8.0   Glucose, UA NEGATIVE NEGATIVE mg/dL   Hgb urine dipstick NEGATIVE NEGATIVE   Bilirubin Urine NEGATIVE NEGATIVE   Ketones, ur NEGATIVE NEGATIVE mg/dL   Protein, ur NEGATIVE NEGATIVE mg/dL   Nitrite POSITIVE (A) NEGATIVE   Leukocytes,Ua SMALL (A) NEGATIVE   RBC / HPF 0-5 0 - 5 RBC/hpf   WBC, UA 6-10 0 - 5 WBC/hpf   Bacteria, UA RARE (A) NONE SEEN   Squamous Epithelial / HPF 0-5 0 - 5 /HPF  MRSA Next Gen by PCR, Nasal     Status: None   Collection Time:  01/02/24  9:37 PM   Specimen: Nasal Mucosa; Nasal Swab  Result Value Ref Range   MRSA by PCR Next Gen NOT DETECTED NOT DETECTED  Basic metabolic panel     Status: Abnormal   Collection Time: 01/03/24  4:41 AM  Result Value Ref Range   Sodium 139 135 - 145 mmol/L   Potassium 4.8 3.5 - 5.1 mmol/L   Chloride 104 98 - 111 mmol/L   CO2 26 22 - 32 mmol/L   Glucose, Bld 108 (H) 70 - 99 mg/dL   BUN 24 (H) 8 - 23 mg/dL   Creatinine, Ser 8.11 (H) 0.44 - 1.00 mg/dL   Calcium  7.8 (L) 8.9 - 10.3 mg/dL  GFR, Estimated 39 (L) >60 mL/min   Anion gap 9 5 - 15  CBC     Status: Abnormal   Collection Time: 01/03/24  4:41 AM  Result Value Ref Range   WBC 7.1 4.0 - 10.5 K/uL   RBC 3.38 (L) 3.87 - 5.11 MIL/uL   Hemoglobin 10.3 (L) 12.0 - 15.0 g/dL   HCT 16.1 (L) 09.6 - 04.5 %   MCV 97.0 80.0 - 100.0 fL   MCH 30.5 26.0 - 34.0 pg   MCHC 31.4 30.0 - 36.0 g/dL   RDW 40.9 81.1 - 91.4 %   Platelets 190 150 - 400 K/uL   nRBC 0.0 0.0 - 0.2 %  Glucose, capillary     Status: Abnormal   Collection Time: 01/03/24  7:04 AM  Result Value Ref Range   Glucose-Capillary 105 (H) 70 - 99 mg/dL     Imaging or Labs ordered: None   Medical history and chart was reviewed and case discussed with medical provider.  Assessment/Plan: 63 year old female with multiple medical comorbidities with a right supracondylar periprosthetic distal femur fracture.  Due to the unstable nature of her injury I recommend proceeding with open reduction internal fixation.  Risk and benefits were discussed with the patient.  Risks include but not limited to bleeding, infection, malunion, nonunion, hardware failure, hardware irritation, nerve and blood vessel injury, DVT, knee stiffness, even the possible anesthetic complications.  She agreed to proceed with surgery and consent was obtained.   Laneta Pintos, MD [facility] [phone] (office) [email or web address]

## 2024-01-03 NOTE — Progress Notes (Signed)
 patient says that she doesn't want her blood sugars checked anymore and wants to be on a regular diet. Chen,DO made aware

## 2024-01-03 NOTE — Hospital Course (Addendum)
 Valerie [name] is a 63 y.o. female with medical history significant for anxiety/depression, NSTEMI in 2021, diabetes, CKD stage IIIB, peripheral neuropathy, bipolar disorder, hypothyroidism, psoriatic arthritis, CVA, dementia, OSA and a recent right total knee replacement in February 2025 by Dr. Agatha Horsfall who presented to the ER for evaluation of right leg pain after a fall.  Patient reports she was out in downtown eating for Mother's Day. Afterwards, a friend was picking her up and as she was walking, she tripped over a curb, fell and landed directly on her right knee.  Started having severe right leg pain and noticed deformity of the proximal part of her right knee.    She was unable to bear weight so some bystanders picked her up and her friend drove her to the ED.  She denies any dizziness, headaches, vision changes, fevers, chills, chest pain, syncope or leg swelling.  Reports a history of falls with her last fall in February 2025 prior to her knee replacement.  She is also scheduled for back surgery on May/29.   **Interim History Found have a periprosthetic fracture around her internal prosthetic right knee joint and orthopedic surgery was consulted and took her for an ORIF on 5/12.  Postoperatively she has an acute anemia drop and has been hypotensive.  Will continue to monitor her blood count and blood pressure and we have now added Midodrine .  Globin has appeared to stabilize in the low sevens and blood pressure is improving so we will start weaning back on the midodrine .  She appears to be medically stable for discharge and now PASRR has been obtained. She will need to F/u with PCP, Orthopedic Surgery, and Cardiology in the outpatient setting.   Assessment and Plan:  Periprosthetic fracture around internal prosthetic right knee joint: S/p ORIF 5/12. WBAT as tolerated. Ortho wants DOAC at discharge w/ Apixaban  and currently on Enoxaparin  40 mg sq daily while hospitalized. Continue prn IV dilaudid , prn  oxycodone  for pain. PT/OT Recommending SNF and agreeable. Medically stable for D/C   Acute postoperative anemia due to expected blood loss: Hgb/Hct Trend now fairly stable: Recent Labs  Lab 01/04/24 0715 01/05/24 0700 01/05/24 1856 01/06/24 6440 01/07/24 0328 01/08/24 0436 01/09/24 0921  HGB 8.4* 7.6* 8.5* 7.2* 7.3* 7.2* 8.4*  HCT 27.1* 24.7* 27.5* 23.4* 23.8* 22.8* 27.6*  MCV 96.8 96.1  --  97.5 98.8 96.6 98.2  -Checked Anemia Panel in the AM and showed an iron  level of 24, UIBC of 180, TIBC of 204, saturation is of 12%, ferritin level 30, folate of 32.9 and vitamin B12 651. CTM for S/Sx of Bleeding; No overt bleeding noted and hemoglobin appears to be stabilized. Repeat CBC in the AM   Essential HTN -> Hypotension: C/w Hydralazine  10 mg po q6hPRN SBP >170. Hold Antihypertensives. Given IVF Bolus 5/14 and mIVF with NS @ 75 mL/hr but this is now stopped. Given Albumin  25 g x2. C/w Midodrine  2.5 mg po TIDwm for now and wean in the outpt setting. CTM BP per Protocol. Last BP reading was on the softer side and is now 117/54   Acute kidney injury superimposed on stage 3b chronic kidney disease [facility]): BUN/Cr Trend had improved and stablized but now slightly worsening  Recent Labs  Lab 01/02/24 1419 01/03/24 0441 01/04/24 0715 01/06/24 3474 01/07/24 0328 01/08/24 0436 01/09/24 0921 01/10/24 0720  BUN 26* 24* 18 19 17 21 19   --   CREATININE 1.68* 1.50* 1.43* 1.18* 1.24* 1.29* 1.48* 1.45*  -IVF with NS now  stopped -Avoid Nephrotoxic Medications, Contrast Dyes, Hypotension and Dehydration to Ensure Adequate Renal Perfusion and will need to Renally Adjust Meds. CTM and Trend Renal Function carefully and repeat CMP in the AM   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 On Chronic Prednisone  5 mg daily and Enbrel 2x weekly. Pt did received 10 mg IV decadron  with surgery yesterday. If BP remains borderline and pt does not need PRBC transfusion, will consider stress dose  steroids; now on Prednisone  20 mg po Daily and will reduce to 10 mg po daily for now and wean further in the next 3 days back to 5 mg po Daily and C/w Enbrel @ D/C.   Constipation with Fecal Impaction: C/w Senna-Docusate 2 tab BID, Miralax  17 grams BID, Bisacodyl  Suppository. Had to be manually disimpacted by Nursing 5/15  Sinus Bradycardia: Asymptomatic and has been in Sinus Bradycardia since 5/16. EKG showed a rate of 50 today and otherwise normal EKG. CTM and Avoid AV Nodal Blocking agents. F/u w/ Cardiology in the outpatient setting for further evaluation if necessary  Bipolar Disorder/Depression/Anxiety: C/w Fluoxetine  80 mg po daily, Lamotrigine  150 mg po BID, Levitiracetam 750 mg po BID, and Trazodone  200 mg po qHS  Parkinson's Disease w/o dyskinesia/Neuromuscular Disorder: C/w Carbidopa -Levodopa  25-100 mg 1 tab po BID and Primidone  100 mg po BID  Hypophosphatemia: Phos Level is now 3.5 on last check. CTM and Replete as Necessary. Repeat Phos level in the AM  Alzheimer's Dementia: C/w Donepezil  10 mg po qHS and Memantine  10 mg po BID   Fall with Right Periprosthetic Fx: S/p ORIF; PT/OT recommending CIR but not a candidate so now agreeable to SNF and medically stable for D/C   Hyperlipidemia associated with T2DM (HCC): C/w Rosuvastatin  10 mg po qHS   DM Type 2 (diabetes mellitus, type 2) complicated by Neuropathy [facility]): Resume Sensitive Novolog  SSI AC. C/w Gabapentin  300 mg po qAM and 600 mg at bedtime. CBGs ranging from 100-145  Hypoalbuminemia: Patient's Albumin  Level is now 3.2 on last check. CTM and Trend and repeat CMP in the AM  Class II Obesity: Complicates overall prognosis and care. Estimated body mass index is 36.58 kg/m as calculated from the following:   Height as of this encounter: 5\' 2"  (1.575 m).   Weight as of this encounter: 90.7 kg. Weight Loss and Dietary Counseling given

## 2024-01-03 NOTE — Anesthesia Preprocedure Evaluation (Addendum)
 Anesthesia Evaluation  Patient identified by MRN, date of birth, ID band Patient awake    Reviewed: Allergy & Precautions, NPO status , Patient's Chart, lab work & pertinent test results, reviewed documented beta blocker date and time   History of Anesthesia Complications Negative for: history of anesthetic complications  Airway Mallampati: II  TM Distance: >3 FB     Dental no notable dental hx. (+) Poor Dentition   Pulmonary shortness of breath, sleep apnea and Continuous Positive Airway Pressure Ventilation , neg COPD, former smoker   Pulmonary exam normal breath sounds clear to auscultation       Cardiovascular hypertension, + CAD and + Past MI  (-) Cardiac Stents, (-) CABG, (-) Peripheral Vascular Disease and (-) CHF  Rhythm:Regular Rate:Normal  2021  TTE  1. Left ventricular ejection fraction, by estimation, is 60 to 65%. The  left ventricle has normal function. The left ventricle has no regional  wall motion abnormalities. Left ventricular diastolic parameters were  normal.   2. Right ventricular systolic function is normal. The right ventricular  size is normal. Tricuspid regurgitation signal is inadequate for assessing  PA pressure.   3. The mitral valve is grossly normal. No evidence of mitral valve  regurgitation. No evidence of mitral stenosis.   4. The aortic valve is tricuspid. Aortic valve regurgitation is not  visualized. No aortic stenosis is present.   5. The inferior vena cava is normal in size with greater than 50%  respiratory variability, suggesting right atrial pressure of 3 mmHg.     Neuro/Psych  Headaches, Seizures -, Well Controlled,  PSYCHIATRIC DISORDERS Anxiety Depression Bipolar Disorder  Dementia  Neuromuscular disease CVA, Residual Symptoms    GI/Hepatic ,GERD  ,,(+) neg Cirrhosis        Endo/Other  diabetes, Type 2 Hyperthyroidism   Renal/GU Renal InsufficiencyRenal diseaseLab Results       Component                Value               Date                         K                        4.8                 01/03/2024                 CREATININE               1.50 (H)            01/03/2024                     Musculoskeletal  (+) Arthritis ,    Abdominal   Peds  Hematology  (+) Blood dyscrasia, anemia Lab Results      Component                Value               Date                      WBC                      7.1  01/03/2024                HGB                      10.3 (L)            01/03/2024                HCT                      32.8 (L)            01/03/2024                MCV                      97.0                01/03/2024                PLT                      190                 01/03/2024              Anesthesia Other Findings All: Sertraline, Carbamazepine  Reproductive/Obstetrics                             Anesthesia Physical Anesthesia Plan  ASA: 3  Anesthesia Plan: General   Post-op Pain Management: Tylenol  PO (pre-op)*   Induction: Intravenous  PONV Risk Score and Plan: 2 and Ondansetron  and Dexamethasone   Airway Management Planned: Oral ETT  Additional Equipment: None  Intra-op Plan:   Post-operative Plan: Extubation in OR  Informed Consent: I have reviewed the patients History and Physical, chart, labs and discussed the procedure including the risks, benefits and alternatives for the proposed anesthesia with the patient or authorized representative who has indicated his/her understanding and acceptance.     Dental advisory given  Plan Discussed with: CRNA and Surgeon  Anesthesia Plan Comments:         Anesthesia Quick Evaluation

## 2024-01-03 NOTE — Progress Notes (Signed)
Diet advanced to regular per pt request.

## 2024-01-03 NOTE — TOC CAGE-AID Note (Signed)
 Transition of Care [facility]) - CAGE-AID Screening   Patient Details  Name: Valerie [name] MRN: [id number] Date of Birth: 09-May-1961  Transition of [facility]) CM/SW Contact:    Juan Noel, RN Phone Number: [phone] 01/03/2024, 6:11 PM   Clinical Narrative: Pt in the hospital after having a fall and sustaining an injury to her right distal femur.  Pt reports occasional alcohol use but denies drug use.  Pt denies need for resources. Screening complete.    CAGE-AID Screening:    Have You Ever Felt You Ought to Cut Down on Your Drinking or Drug Use?: No Have People Annoyed You By Critizing Your Drinking Or Drug Use?: No Have You Felt Bad Or Guilty About Your Drinking Or Drug Use?: No Have You Ever Had a Drink or Used Drugs First Thing In The Morning to Steady Your Nerves or to Get Rid of a Hangover?: No CAGE-AID Score: 0  Substance Abuse Education Offered: No

## 2024-01-03 NOTE — Progress Notes (Signed)
 Chen,DO made aware    01/03/24 1759  Vitals  Temp 99 F (37.2 C)  Temp Source Oral  BP (!) 88/76 (RN made aware)  MAP (mmHg) (!) 60  BP Location Right Arm  BP Method Automatic  Patient Position (if appropriate) Lying  Pulse Rate 65  Pulse Rate Source Dinamap  Resp 17  MEWS COLOR  MEWS Score Color Green  Oxygen Therapy  SpO2 98 %  O2 Device Nasal Cannula  O2 Flow Rate (L/min) 3 L/min  MEWS Score  MEWS Temp 0  MEWS Systolic 1  MEWS Pulse 0  MEWS RR 0  MEWS LOC 0  MEWS Score 1

## 2024-01-03 NOTE — Subjective & Objective (Signed)
 Pt seen and examined. Had ORIF of periprosthetic fracture. Is WBAT. Lives at home alone in [place]. She had friend stay with her when she had right TKA last year. Pt plans on returning home and having same friend stay with her at home.

## 2024-01-03 NOTE — Assessment & Plan Note (Signed)
 01-03-2024 on hydralazine .

## 2024-01-03 NOTE — Plan of Care (Signed)

## 2024-01-03 NOTE — Assessment & Plan Note (Signed)
 01-03-2024 on crestor , lovaza  01-04-2024 stable

## 2024-01-03 NOTE — Assessment & Plan Note (Signed)
 01-03-2024 on SSI.  01-04-2024 pt refusing SSI an diabetic diet.

## 2024-01-03 NOTE — Progress Notes (Signed)
 lactated ringers  bolus 1,000 mL started per order

## 2024-01-03 NOTE — Progress Notes (Addendum)
 PROGRESS NOTE    Daniely Ahuja  WUJ:[id number] DOB: Apr 26, 1961 DOA: 01/02/2024 PCP: Vevelyn Gowers, NP  Subjective: Pt seen and examined. Had ORIF of periprosthetic fracture. Is WBAT. Lives at home alone in [place]. She had friend stay with her when she had right TKA last year. Pt plans on returning home and having same friend stay with her at home.   Hospital Course: HPI: Lynze Ashmore is a 63 y.o. female with medical history significant for anxiety/depression, NSTEMI in 2021, diabetes, CKD stage IIIB, peripheral neuropathy, bipolar disorder, hypothyroidism, psoriatic arthritis, CVA, dementia, OSA and a recent right total knee replacement in February 2025 by Dr. Agatha Horsfall who presented to the ER for evaluation of right leg pain after a fall.  Patient reports she was out in downtown eating for Mother's Day. Afterwards, a friend was picking her up and as she was walking, she tripped over a curb, fell and landed directly on her right knee.  Started having severe right leg pain and noticed deformity of the proximal part of her right knee.  She was unable to bear weight so some bystanders picked her up and her friend drove her to the ED.  She denies any dizziness, headaches, vision changes, fevers, chills, chest pain, syncope or leg swelling.  Reports a history of falls with her last fall in February 2025 prior to her knee replacement.  She is also scheduled for back surgery on May/29.   Significant Events: Admitted 01/02/2024 for right periprosthetic fracture   Significant Labs: Na 139, K 4.0, CO2 of 19, BUN 26, Scr 1.68, glu 154 WBC 9.3, HgB 11.3, Plt 237  Significant Imaging Studies: Right Femur XR Acute displaced and overriding distal femur fracture extending to the femoral component of the knee arthroplasty. 2. No acute fracture of the pelvis.  Intact hip arthroplasties.  Antibiotic Therapy: Anti-infectives (From admission, onward)    Start     Dose/Rate Route Frequency Ordered Stop    01/03/24 1600  ceFAZolin  (ANCEF ) IVPB 2g/100 mL premix        2 g 200 mL/hr over 30 Minutes Intravenous Every 8 hours 01/03/24 0949 01/04/24 1559   01/03/24 0815  vancomycin (VANCOCIN) powder  Status:  Discontinued          As needed 01/03/24 0815 01/03/24 0859   01/03/24 0600  ceFAZolin  (ANCEF ) IVPB 2g/100 mL premix        2 g 200 mL/hr over 30 Minutes Intravenous On call to O.R. 01/02/24 1829 01/03/24 0752   01/03/24 0000  fluconazole  (DIFLUCAN ) tablet 150 mg  Status:  Discontinued        150 mg Oral Every 3 DAYS 01/02/24 1718 01/02/24 1730       Procedures: 01-03-2024 ORIF right periprosthetic fracture  Consultants: ortho    Assessment and Plan: * Periprosthetic fracture around internal prosthetic right knee joint 01-03-2024 s/p ORIF today. WBAT as tolerated. Lovenox  for DVT prophylaxis while in the hospital. Ortho wants DOAC at discharge. Will defer to ortho which DOAC they want. Continue prn IV dilaudid , prn oxycodone  for pain. Hopefully home in next 1-2 days.  Lives at home alone in [place]. She had friend stay with her when she had right TKA last year. Pt plans on returning home and having same friend stay with her at home.   Acute kidney injury superimposed on stage 3b chronic kidney disease (HCC) 01-03-2024 continue IVF. baseline Scr 1.4   Fall 01-03-2024 sustained right periprosthetic fracture. S/p ORIF today.  Hyperlipidemia associated with type 2  diabetes mellitus (HCC) 01-03-2024 on crestor , lovaza  Essential hypertension 01-03-2024 on hydralazine .   DM type 2 (diabetes mellitus, type 2) (HCC) 01-03-2024 on SSI.  Chronic kidney disease, stage 3b (HCC) - baseline Scr 1.4 01-03-2024 baseline Scr 1.4   Obesity, Class II, BMI 35-39.9 Body mass index is 36.58 kg/m.    DVT prophylaxis: enoxaparin  (LOVENOX ) injection 40 mg Start: 01/04/24 0800 SCDs Start: 01/03/24 0949 SCDs Start: 01/02/24 1628  Lovenox    Code Status: Full Code Family Communication: no  family at bedside. Pt is decisional. Disposition Plan: return home Reason for continuing need for hospitalization: awaiting DC clearance by ortho. Awaiting PT/OT evaluation. Just had ORIF this AM.  Objective: Vitals:   01/03/24 0915 01/03/24 0930 01/03/24 0945 01/03/24 1013  BP: 125/72 (!) 110/46 (!) 101/56 (!) 127/45  Pulse: 65 64 60 71  Resp: 13 17 16 16   Temp:   97.8 F (36.6 C)   TempSrc:      SpO2: 93% 98% 98% 92%  Weight:      Height:        Intake/Output Summary (Last 24 hours) at 01/03/2024 1321 Last data filed at 01/03/2024 0904 Gross per 24 hour  Intake 2432.7 ml  Output 250 ml  Net 2182.7 ml   Filed Weights   01/02/24 1331 01/03/24 0659  Weight: 95.3 kg 90.7 kg    Examination:  Physical Exam Vitals and nursing note reviewed.  Constitutional:      General: She is not in acute distress.    Appearance: She is obese. She is not toxic-appearing or diaphoretic.  HENT:     Head: Normocephalic and atraumatic.     Nose: Nose normal.  Eyes:     General: No scleral icterus. Cardiovascular:     Rate and Rhythm: Normal rate and regular rhythm.  Pulmonary:     Effort: Pulmonary effort is normal.     Breath sounds: Normal breath sounds.  Abdominal:     General: Abdomen is protuberant. Bowel sounds are normal. There is no distension.     Palpations: Abdomen is soft.     Tenderness: There is no abdominal tenderness.  Musculoskeletal:     Right lower leg: No edema.     Left lower leg: No edema.  Skin:    General: Skin is warm and dry.     Capillary Refill: Capillary refill takes less than 2 seconds.  Neurological:     General: No focal deficit present.     Mental Status: She is alert and oriented to person, place, and time.     Data Reviewed: I have personally reviewed following labs and imaging studies  CBC: Recent Labs  Lab 01/02/24 1419 01/03/24 0441  WBC 9.3 7.1  NEUTROABS 7.4  --   HGB 11.3* 10.3*  HCT 37.8 32.8*  MCV 99.5 97.0  PLT 237 190    Basic Metabolic Panel: Recent Labs  Lab 01/02/24 1419 01/03/24 0441  NA 139 139  K 4.0 4.8  CL 104 104  CO2 19* 26  GLUCOSE 154* 108*  BUN 26* 24*  CREATININE 1.68* 1.50*  CALCIUM  8.6* 7.8*   GFR: Estimated Creatinine Clearance: 39.7 mL/min (A) (by C-G formula based on SCr of 1.5 mg/dL (H)). Liver Function Tests: Recent Labs  Lab 01/02/24 1419  AST 23  ALT 6  ALKPHOS 91  BILITOT 0.5  PROT 6.5  ALBUMIN 3.6   CBG: Recent Labs  Lab 01/02/24 2104 01/03/24 0704 01/03/24 0903 01/03/24 1148  GLUCAP 104* 105*  122* 155*    Recent Results (from the past 240 hours)  MRSA Next Gen by PCR, Nasal     Status: None   Collection Time: 01/02/24  9:37 PM   Specimen: Nasal Mucosa; Nasal Swab  Result Value Ref Range Status   MRSA by PCR Next Gen NOT DETECTED NOT DETECTED Final    Comment: (NOTE) The GeneXpert MRSA Assay (FDA approved for NASAL specimens only), is one component of a comprehensive MRSA colonization surveillance program. It is not intended to diagnose MRSA infection nor to guide or monitor treatment for MRSA infections. Test performance is not FDA approved in patients less than 99 years old. Performed at [facility], [address], [place], [place] [address]      Radiology Studies: DG FEMUR PORT, MIN 2 VIEWS RIGHT Result Date: 01/03/2024 CLINICAL DATA:  Fracture, postop. EXAM: RIGHT FEMUR PORTABLE 2 VIEW COMPARISON:  Preoperative imaging FINDINGS: Lateral plate and screw fixation of comminuted distal femur fracture. Improved fracture alignment from preoperative imaging. Previous knee and hip arthroplasty. Recent postsurgical change includes air and edema in the soft tissues. IMPRESSION: ORIF of comminuted distal femur fracture. Electronically Signed   By: Chadwick Colonel M.D.   On: 01/03/2024 11:39   DG FEMUR, MIN 2 VIEWS RIGHT Result Date: 01/03/2024 CLINICAL DATA:  Elective surgery. EXAM: RIGHT FEMUR 2 VIEWS COMPARISON:  Preoperative imaging  FINDINGS: Five fluoroscopic spot views of the right femur submitted from the operating room. Lateral plate and screw fixation of comminuted distal femur fracture. Previous knee and hip arthroplasty. Fluoroscopy time 57.5 seconds. Dose 5.87 mGy. IMPRESSION: Intraoperative fluoroscopy during distal femur fracture fixation. Electronically Signed   By: Chadwick Colonel M.D.   On: 01/03/2024 11:38   DG C-Arm 1-60 Min-No Report Result Date: 01/03/2024 Fluoroscopy was utilized by the requesting physician.  No radiographic interpretation.   DG Chest Port 1 View Result Date: 01/02/2024 CLINICAL DATA:  Preop, femur fracture EXAM: PORTABLE CHEST 1 VIEW COMPARISON:  Radiograph 10/21/2023 FINDINGS: Stable mild cardiomegaly.The cardiomediastinal contours are unchanged. The lungs are clear. Pulmonary vasculature is normal. No consolidation, pleural effusion, or pneumothorax. Remote right rib fracture. No acute osseous abnormalities are seen. IMPRESSION: Stable mild cardiomegaly. No acute chest findings. Electronically Signed   By: Chadwick Colonel M.D.   On: 01/02/2024 14:55   DG Femur Min 2 Views Right Result Date: 01/02/2024 CLINICAL DATA:  Trip and fall with injury to the right lower extremity. EXAM: PELVIS - 1-2 VIEW; RIGHT FEMUR 2 VIEWS; RIGHT KNEE - COMPLETE 4+ VIEW COMPARISON:  None Available. FINDINGS: Pelvis: Bilateral hip arthroplasties are intact were visualized. No periprosthetic fracture. No acute fracture of the pelvis. Pubic rami are intact. Femur: Periprosthetic fracture will be described below. The femoral shaft is intact. Knee: Knee arthroplasty is in place. Acute distal femur fracture extends to the femoral component with significant displacement. Osseous overriding is at least 5 cm. The tibial and femoral components appear congruent. IMPRESSION: 1. Acute displaced and overriding distal femur fracture extending to the femoral component of the knee arthroplasty. 2. No acute fracture of the pelvis.   Intact hip arthroplasties. Electronically Signed   By: Chadwick Colonel M.D.   On: 01/02/2024 14:54   DG Knee Complete 4 Views Right Result Date: 01/02/2024 CLINICAL DATA:  Trip and fall with injury to the right lower extremity. EXAM: PELVIS - 1-2 VIEW; RIGHT FEMUR 2 VIEWS; RIGHT KNEE - COMPLETE 4+ VIEW COMPARISON:  None Available. FINDINGS: Pelvis: Bilateral hip arthroplasties are intact were  visualized. No periprosthetic fracture. No acute fracture of the pelvis. Pubic rami are intact. Femur: Periprosthetic fracture will be described below. The femoral shaft is intact. Knee: Knee arthroplasty is in place. Acute distal femur fracture extends to the femoral component with significant displacement. Osseous overriding is at least 5 cm. The tibial and femoral components appear congruent. IMPRESSION: 1. Acute displaced and overriding distal femur fracture extending to the femoral component of the knee arthroplasty. 2. No acute fracture of the pelvis.  Intact hip arthroplasties. Electronically Signed   By: Chadwick Colonel M.D.   On: 01/02/2024 14:54   DG Pelvis 1-2 Views Result Date: 01/02/2024 CLINICAL DATA:  Trip and fall with injury to the right lower extremity. EXAM: PELVIS - 1-2 VIEW; RIGHT FEMUR 2 VIEWS; RIGHT KNEE - COMPLETE 4+ VIEW COMPARISON:  None Available. FINDINGS: Pelvis: Bilateral hip arthroplasties are intact were visualized. No periprosthetic fracture. No acute fracture of the pelvis. Pubic rami are intact. Femur: Periprosthetic fracture will be described below. The femoral shaft is intact. Knee: Knee arthroplasty is in place. Acute distal femur fracture extends to the femoral component with significant displacement. Osseous overriding is at least 5 cm. The tibial and femoral components appear congruent. IMPRESSION: 1. Acute displaced and overriding distal femur fracture extending to the femoral component of the knee arthroplasty. 2. No acute fracture of the pelvis.  Intact hip arthroplasties.  Electronically Signed   By: Chadwick Colonel M.D.   On: 01/02/2024 14:54    Scheduled Meds:  acetaminophen   1,000 mg Oral TID   carbidopa -levodopa   1 tablet Oral BID   docusate sodium   100 mg Oral BID   donepezil   10 mg Oral Daily   [START ON 01/04/2024] enoxaparin  (LOVENOX ) injection  40 mg Subcutaneous Q24H   FLUoxetine   80 mg Oral Daily   gabapentin   300 mg Oral q morning   And   gabapentin   600 mg Oral QHS   HYDROmorphone        insulin  aspart  0-15 Units Subcutaneous TID WC   lamoTRIgine   150 mg Oral BID   levETIRAcetam   750 mg Oral BID   memantine   10 mg Oral BID   multivitamin with minerals  1 tablet Oral QHS   omega-3 acid ethyl esters  1 g Oral Daily   oxybutynin   5 mg Oral BID   pantoprazole   40 mg Oral Daily   predniSONE   5 mg Oral Q breakfast   primidone   100 mg Oral BID   rosuvastatin   10 mg Oral QHS   senna-docusate  2 tablet Oral Daily   tiZANidine  4 mg Oral TID   traZODone   200 mg Oral QHS   Continuous Infusions:  sodium chloride  100 mL/hr at 01/02/24 2130    ceFAZolin  (ANCEF ) IV       LOS: 1 day   Time spent: 45 minutes  Unk Garb, DO  [facility]  01/03/2024, 1:21 PM

## 2024-01-03 NOTE — Anesthesia Procedure Notes (Signed)
 Procedure Name: Intubation Date/Time: 01/03/2024 7:50 AM  Performed by: Merna Aase, CRNAPre-anesthesia Checklist: Patient identified, Patient being monitored, Timeout performed, Emergency Drugs available and Suction available Patient Re-evaluated:Patient Re-evaluated prior to induction Oxygen Delivery Method: Circle system utilized Preoxygenation: Pre-oxygenation with 100% oxygen Induction Type: IV induction Ventilation: Mask ventilation without difficulty Laryngoscope Size: Mac and 4 Grade View: Grade I Tube type: Oral Tube size: 7.0 mm Number of attempts: 1 Airway Equipment and Method: Stylet Placement Confirmation: ETT inserted through vocal cords under direct vision, positive ETCO2 and breath sounds checked- equal and bilateral Secured at: 21 cm Tube secured with: Tape Dental Injury: Teeth and Oropharynx as per pre-operative assessment

## 2024-01-03 NOTE — H&P (View-Only) (Signed)
 Orthopaedic Trauma Service (OTS) Consult   Patient ID: Valerie [name] MRN: [id number] DOB/AGE: 10/23/1960 63 y.o.  Reason for Consult:Right supracondylar distal femur fracture Referring Physician: Dr. Lanis Pitcher, MD [facility]  HPI: Valerie [name] is an 63 y.o. female who is being seen in consultation at the request of Dr. Agatha Horsfall for evaluation of right distal femur fracture.  Patient had a total knee arthroplasty with Dr. Agatha Horsfall on February 25 of this year.  She was doing well still walking with a cane.  She tripped over a curb landing on her leg had immediate pain and deformity.  She lives alone but she has her sister and daughter who can assist as well as other friends upon potential discharge.  She is complaining of significant pain in her right leg.  She is scheduled to have lumbar surgery later this month.  She states that she has peripheral neuropathy and that has caused issues with her balance.  She is diabetic but her hemoglobin A1c has been under 6 for the last 5 years.  She currently does not take any medications.  She has quit smoking but does vape occasionally.  Denies any other issues of note.  Past Medical History:  Diagnosis Date   Anemia    Anxiety    Arthritis    Psoriatic   Bipolar disorder (HCC)    CKD (chronic kidney disease), stage III (HCC)    Colovesical fistula    Dementia (HCC)    Depression    DM type 2 (diabetes mellitus, type 2) (HCC) 11/14/2019   Dyspnea    E coli bacteremia 07/28/2023   Headache    Hyperthyroidism    Neuromuscular disorder (HCC)    Neuropathy    NSTEMI (non-ST elevated myocardial infarction) (HCC) 11/14/2019   OSA (obstructive sleep apnea)    Primary localized osteoarthritis of right knee 10/19/2023   Psoriatic arthritis (HCC)    Pyelonephritis 07/23/2023   Sleep apnea    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   Stroke (HCC)    ?history; MRI normal    Past Surgical History:  Procedure Laterality Date   ABDOMINAL  HYSTERECTOMY     APPENDECTOMY     CHOLECYSTECTOMY     CYSTOSCOPY WITH STENT PLACEMENT N/A 07/17/2020   Procedure: CYSTOSCOPY WITH BILATERAL FIREFLY INJECTION;  Surgeon: Florencio Hunting, MD;  Location: [facility];  Service: Urology;  Laterality: N/A;   FLEXIBLE SIGMOIDOSCOPY N/A 05/17/2020   Procedure: FLEXIBLE SIGMOIDOSCOPY;  Surgeon: Alvis Jourdain, MD;  Location: [facility];  Service: Endoscopy;  Laterality: N/A;   FOOT SURGERY Right    IR FLUORO GUIDE CV LINE RIGHT  08/19/2020   IR RADIOLOGIST EVAL & MGMT  09/10/2020   IR RADIOLOGIST EVAL & MGMT  10/10/2020   IR REMOVAL TUN CV CATH W/O FL  09/16/2020   IR SINUS/FIST TUBE CHK-NON GI  10/24/2020   IR SINUS/FIST TUBE CHK-NON GI  10/31/2020   IR US  GUIDE VASC ACCESS RIGHT  08/19/2020   LEFT HEART CATH AND CORONARY ANGIOGRAPHY N/A 11/14/2019   Procedure: LEFT HEART CATH AND CORONARY ANGIOGRAPHY;  Surgeon: Swaz[place], Peter M, MD;  Location: [facility] INVASIVE CV LAB;  Service: Cardiovascular;  Laterality: N/A;   PARATHYROID  EXPLORATION N/A 01/09/2021   Procedure: POSSIBLE NECK EXPLORATION;  Surgeon: Oralee Billow, MD;  Location: [facility];  Service: General;  Laterality: N/A;   PARATHYROIDECTOMY Left 01/09/2021   Procedure: LEFT INFERIOR PARATHYROIDECTOMY;  Surgeon: Oralee Billow, MD;  Location: [facility];  Service: General;  Laterality: Left;  PROCTOSCOPY N/A 07/17/2020   Procedure: RIGID PROCTOSCOPY;  Surgeon: Candyce Champagne, MD;  Location: [facility];  Service: General;  Laterality: N/A;   TONSILLECTOMY     TOTAL HIP ARTHROPLASTY Bilateral    TOTAL KNEE ARTHROPLASTY Right 10/19/2023   Procedure: TOTAL KNEE ARTHROPLASTY;  Surgeon: Osa Blase, MD;  Location: [facility];  Service: Orthopedics;  Laterality: Right;    Family History  Problem Relation Age of Onset   Breast cancer Neg Hx     Social History:  reports that she has quit smoking. Her smoking use included cigarettes. She has been exposed to tobacco smoke. She has never used smokeless tobacco. She reports current  alcohol use. She reports that she does not use drugs.  Allergies:  Allergies  Allergen Reactions   Carbamazepine Other (See Comments)    Parkinsons like symptoms tremors   Sertraline Hcl Other (See Comments)    Parkinsons like symptoms    Medications:  No current facility-administered medications on file prior to encounter.   Current Outpatient Medications on File Prior to Encounter  Medication Sig Dispense Refill   ALPRAZolam  (XANAX ) 0.5 MG tablet Take 0.5 tablets (0.25 mg total) by mouth 3 (three) times daily as needed for anxiety. (Patient taking differently: Take 0.5 mg by mouth 3 (three) times daily as needed for anxiety.) 5 tablet 0   aspirin  EC 81 MG tablet Take 81 mg by mouth daily. Swallow whole.     carbidopa -levodopa  (SINEMET  IR) 25-100 MG tablet Take 1 tablet by mouth in the morning and at bedtime.     Docusate Calcium  (STOOL SOFTENER PO) Take 1 tablet by mouth in the morning and at bedtime.     donepezil  (ARICEPT ) 10 MG tablet Take 10 mg by mouth daily.     ENBREL 50 MG/ML injection Inject 50 mg into the skin See admin instructions. Take 50 mg 2 times a week for 4 weeks take 50 mg once  a week     fluconazole  (DIFLUCAN ) 150 MG tablet Take 150 mg by mouth every 3 (three) days.     FLUoxetine  (PROZAC ) 40 MG capsule Take 1 capsule (40 mg total) by mouth daily. (Patient taking differently: Take 80 mg by mouth daily.)     gabapentin  (NEURONTIN ) 300 MG capsule Take 300-600 mg by mouth See admin instructions. Take 300 mg in the morning and 600 mg at night     lamoTRIgine  (LAMICTAL ) 150 MG tablet Take 1 tablet by mouth 2 (two) times daily.     Lecanemab-irmb (LEQEMBI IV) Inject into the vein every 14 (fourteen) days.     levETIRAcetam  (KEPPRA ) 750 MG tablet Take 750 mg by mouth 2 (two) times daily.     levocetirizine (XYZAL ) 5 MG tablet Take 1 tablet (5 mg total) by mouth every evening. 90 tablet 3   meclizine  (ANTIVERT ) 25 MG tablet Take 25 mg by mouth 3 (three) times daily as  needed for dizziness.     memantine  (NAMENDA ) 10 MG tablet Take 10 mg by mouth 2 (two) times daily.     Multiple Vitamins-Minerals (MULTIVITAMIN WITH MINERALS) tablet Take 1 tablet by mouth at bedtime.     nitroGLYCERIN  (NITROSTAT ) 0.4 MG SL tablet Place 1 tablet (0.4 mg total) under the tongue every 5 (five) minutes as needed for chest pain. 10 tablet 2   Omega-3 Fatty Acids (FISH OIL PO) Take 2 capsules by mouth daily.     omeprazole  (PRILOSEC) 40 MG capsule Take 1 capsule (40 mg total) by mouth daily. 90  capsule 3   ondansetron  (ZOFRAN ) 8 MG tablet Take 1 tablet (8 mg total) by mouth every 8 (eight) hours as needed for nausea or vomiting. 20 tablet 0   oxybutynin  (DITROPAN ) 5 MG tablet Take 5 mg by mouth 2 (two) times daily.     oxyCODONE  (OXY IR/ROXICODONE ) 5 MG immediate release tablet Take 5 mg by mouth every 4 (four) hours as needed for severe pain (pain score 7-10).     predniSONE  (DELTASONE ) 5 MG tablet TAKE 1 TABLET(5 MG) BY MOUTH DAILY WITH BREAKFAST 90 tablet 1   primidone  (MYSOLINE ) 50 MG tablet Take 100 mg by mouth 2 (two) times daily.     rizatriptan  (MAXALT ) 10 MG tablet Take 10 mg by mouth as needed for migraine. May repeat in 2 hours if needed     rosuvastatin  (CRESTOR ) 10 MG tablet Take 1 tablet (10 mg total) by mouth daily. 90 tablet 3   sennosides-docusate sodium  (SENOKOT-S) 8.6-50 MG tablet Take 2 tablets by mouth daily. 30 tablet 1   tiZANidine (ZANAFLEX) 4 MG tablet 1 tablet Orally four times a day for 30 days As needed     traZODone  (DESYREL ) 100 MG tablet Take 2 tablets (200 mg total) by mouth at bedtime. 60 tablet 0   baclofen  (LIORESAL ) 10 MG tablet Take 1 tablet (10 mg total) by mouth 3 (three) times daily. As needed for muscle spasm (Patient not taking: Reported on 01/02/2024) 50 tablet 0   polyethylene glycol (MIRALAX  / GLYCOLAX ) 17 g packet Take 17 g by mouth 2 (two) times daily. (Patient not taking: Reported on 01/02/2024)       ROS: Constitutional: No fever or  chills Vision: No changes in vision ENT: No difficulty swallowing CV: No chest pain Pulm: No SOB or wheezing GI: No nausea or vomiting GU: No urgency or inability to hold urine Skin: No poor wound healing Neurologic: No numbness or tingling Psychiatric: No depression or anxiety Heme: No bruising Allergic: No reaction to medications or food   Exam: Blood pressure 123/73, pulse (!) 57, temperature 99 F (37.2 C), temperature source Oral, resp. rate 17, height 5\' 2"  (1.575 m), weight 90.7 kg, SpO2 92%. General: No acute distress Orientation: Awake alert and oriented x 3 Mood and Affect: Cooperative and pleasant Gait: Unable to assess due to her fracture Coordination and balance: Within normal limits  Right lower extremity: Knee immobilizer is in place.  Obvious deformity through the distal femur.  Compartments are soft compressible.  Active dorsiflexion plantarflexion of foot and ankle.  She has sensation intact to light touch to all nerve distributions.  Warm well-perfused foot.  Left lower extremity: Skin without lesions. No tenderness to palpation. Full painless ROM, full strength in each muscle groups without evidence of instability.   Medical Decision Making: Data: Imaging: X-rays of the right femur and knee are reviewed which shows a periprosthetic distal femur fracture with significant displacement.  She has a previous total hip arthroplasty in place.  Labs:  Results for orders placed or performed during the hospital encounter of 01/02/24 (from the past 24 hours)  CBC with Differential/Platelet     Status: Abnormal   Collection Time: 01/02/24  2:19 PM  Result Value Ref Range   WBC 9.3 4.0 - 10.5 K/uL   RBC 3.80 (L) 3.87 - 5.11 MIL/uL   Hemoglobin 11.3 (L) 12.0 - 15.0 g/dL   HCT 16.1 09.6 - 04.5 %   MCV 99.5 80.0 - 100.0 fL   MCH 29.7 26.0 -  34.0 pg   MCHC 29.9 (L) 30.0 - 36.0 g/dL   RDW 16.1 09.6 - 04.5 %   Platelets 237 150 - 400 K/uL   nRBC 0.0 0.0 - 0.2 %    Neutrophils Relative % 78 %   Neutro Abs 7.4 1.7 - 7.7 K/uL   Lymphocytes Relative 9 %   Lymphs Abs 0.8 0.7 - 4.0 K/uL   Monocytes Relative 7 %   Monocytes Absolute 0.6 0.1 - 1.0 K/uL   Eosinophils Relative 4 %   Eosinophils Absolute 0.3 0.0 - 0.5 K/uL   Basophils Relative 1 %   Basophils Absolute 0.1 0.0 - 0.1 K/uL   Immature Granulocytes 1 %   Abs Immature Granulocytes 0.05 0.00 - 0.07 K/uL  Comprehensive metabolic panel with GFR     Status: Abnormal   Collection Time: 01/02/24  2:19 PM  Result Value Ref Range   Sodium 139 135 - 145 mmol/L   Potassium 4.0 3.5 - 5.1 mmol/L   Chloride 104 98 - 111 mmol/L   CO2 19 (L) 22 - 32 mmol/L   Glucose, Bld 154 (H) 70 - 99 mg/dL   BUN 26 (H) 8 - 23 mg/dL   Creatinine, Ser 4.09 (H) 0.44 - 1.00 mg/dL   Calcium  8.6 (L) 8.9 - 10.3 mg/dL   Total Protein 6.5 6.5 - 8.1 g/dL   Albumin 3.6 3.5 - 5.0 g/dL   AST 23 15 - 41 U/L   ALT 6 0 - 44 U/L   Alkaline Phosphatase 91 38 - 126 U/L   Total Bilirubin 0.5 0.0 - 1.2 mg/dL   GFR, Estimated 34 (L) >60 mL/min   Anion gap 16 (H) 5 - 15  Glucose, capillary     Status: Abnormal   Collection Time: 01/02/24  9:04 PM  Result Value Ref Range   Glucose-Capillary 104 (H) 70 - 99 mg/dL  Urinalysis, Routine w reflex microscopic -Urine, Clean Catch     Status: Abnormal   Collection Time: 01/02/24  9:35 PM  Result Value Ref Range   Color, Urine YELLOW YELLOW   APPearance CLEAR CLEAR   Specific Gravity, Urine 1.012 1.005 - 1.030   pH 5.0 5.0 - 8.0   Glucose, UA NEGATIVE NEGATIVE mg/dL   Hgb urine dipstick NEGATIVE NEGATIVE   Bilirubin Urine NEGATIVE NEGATIVE   Ketones, ur NEGATIVE NEGATIVE mg/dL   Protein, ur NEGATIVE NEGATIVE mg/dL   Nitrite POSITIVE (A) NEGATIVE   Leukocytes,Ua SMALL (A) NEGATIVE   RBC / HPF 0-5 0 - 5 RBC/hpf   WBC, UA 6-10 0 - 5 WBC/hpf   Bacteria, UA RARE (A) NONE SEEN   Squamous Epithelial / HPF 0-5 0 - 5 /HPF  MRSA Next Gen by PCR, Nasal     Status: None   Collection Time:  01/02/24  9:37 PM   Specimen: Nasal Mucosa; Nasal Swab  Result Value Ref Range   MRSA by PCR Next Gen NOT DETECTED NOT DETECTED  Basic metabolic panel     Status: Abnormal   Collection Time: 01/03/24  4:41 AM  Result Value Ref Range   Sodium 139 135 - 145 mmol/L   Potassium 4.8 3.5 - 5.1 mmol/L   Chloride 104 98 - 111 mmol/L   CO2 26 22 - 32 mmol/L   Glucose, Bld 108 (H) 70 - 99 mg/dL   BUN 24 (H) 8 - 23 mg/dL   Creatinine, Ser 8.11 (H) 0.44 - 1.00 mg/dL   Calcium  7.8 (L) 8.9 - 10.3 mg/dL  GFR, Estimated 39 (L) >60 mL/min   Anion gap 9 5 - 15  CBC     Status: Abnormal   Collection Time: 01/03/24  4:41 AM  Result Value Ref Range   WBC 7.1 4.0 - 10.5 K/uL   RBC 3.38 (L) 3.87 - 5.11 MIL/uL   Hemoglobin 10.3 (L) 12.0 - 15.0 g/dL   HCT 16.1 (L) 09.6 - 04.5 %   MCV 97.0 80.0 - 100.0 fL   MCH 30.5 26.0 - 34.0 pg   MCHC 31.4 30.0 - 36.0 g/dL   RDW 40.9 81.1 - 91.4 %   Platelets 190 150 - 400 K/uL   nRBC 0.0 0.0 - 0.2 %  Glucose, capillary     Status: Abnormal   Collection Time: 01/03/24  7:04 AM  Result Value Ref Range   Glucose-Capillary 105 (H) 70 - 99 mg/dL     Imaging or Labs ordered: None   Medical history and chart was reviewed and case discussed with medical provider.  Assessment/Plan: 63 year old female with multiple medical comorbidities with a right supracondylar periprosthetic distal femur fracture.  Due to the unstable nature of her injury I recommend proceeding with open reduction internal fixation.  Risk and benefits were discussed with the patient.  Risks include but not limited to bleeding, infection, malunion, nonunion, hardware failure, hardware irritation, nerve and blood vessel injury, DVT, knee stiffness, even the possible anesthetic complications.  She agreed to proceed with surgery and consent was obtained.   Laneta Pintos, MD [facility] [phone] (office) [email or web address]

## 2024-01-03 NOTE — Assessment & Plan Note (Signed)
 01-03-2024 s/p ORIF today. WBAT as tolerated. Lovenox  for DVT prophylaxis while in the hospital. Ortho wants DOAC at discharge. Will defer to ortho which DOAC they want. Continue prn IV dilaudid , prn oxycodone  for pain. Hopefully home in next 1-2 days.  Lives at home alone in [place]. She had friend stay with her when she had right TKA last year. Pt plans on returning home and having same friend stay with her at home.   01-04-2024 pt still having pain. Awaiting PT consult. May need SNF placement.

## 2024-01-03 NOTE — Anesthesia Postprocedure Evaluation (Signed)
 Anesthesia Post Note  Patient: Nurse, mental health  Procedure(s) Performed: OPEN REDUCTION INTERNAL FIXATION (ORIF) PERIPROSTHETIC FRACTURE (Right)     Patient location during evaluation: PACU Anesthesia Type: General Level of consciousness: awake and alert Pain management: pain level controlled Vital Signs Assessment: post-procedure vital signs reviewed and stable Respiratory status: spontaneous breathing, nonlabored ventilation, respiratory function stable and patient connected to nasal cannula oxygen Cardiovascular status: blood pressure returned to baseline and stable Postop Assessment: no apparent nausea or vomiting Anesthetic complications: no  No notable events documented.  Last Vitals:  Vitals:   01/03/24 0945 01/03/24 1013  BP: (!) 101/56 (!) 127/45  Pulse: 60 71  Resp: 16 16  Temp: 36.6 C   SpO2: 98% 92%    Last Pain:  Vitals:   01/03/24 1056  TempSrc:   PainSc: Asleep                 Rosalita Combe

## 2024-01-04 ENCOUNTER — Encounter (HOSPITAL_COMMUNITY): Payer: Self-pay | Admitting: Student

## 2024-01-04 DIAGNOSIS — D84821 Immunodeficiency due to drugs: Secondary | ICD-10-CM | POA: Diagnosis not present

## 2024-01-04 DIAGNOSIS — M9711XA Periprosthetic fracture around internal prosthetic right knee joint, initial encounter: Secondary | ICD-10-CM | POA: Diagnosis not present

## 2024-01-04 DIAGNOSIS — D62 Acute posthemorrhagic anemia: Secondary | ICD-10-CM | POA: Diagnosis not present

## 2024-01-04 DIAGNOSIS — N179 Acute kidney failure, unspecified: Secondary | ICD-10-CM | POA: Diagnosis not present

## 2024-01-04 DIAGNOSIS — Z79899 Other long term (current) drug therapy: Secondary | ICD-10-CM

## 2024-01-04 LAB — BASIC METABOLIC PANEL WITH GFR
Anion gap: 7 (ref 5–15)
BUN: 18 mg/dL (ref 8–23)
CO2: 27 mmol/L (ref 22–32)
Calcium: 7.9 mg/dL — ABNORMAL LOW (ref 8.9–10.3)
Chloride: 106 mmol/L (ref 98–111)
Creatinine, Ser: 1.43 mg/dL — ABNORMAL HIGH (ref 0.44–1.00)
GFR, Estimated: 41 mL/min — ABNORMAL LOW
Glucose, Bld: 101 mg/dL — ABNORMAL HIGH (ref 70–99)
Potassium: 4.1 mmol/L (ref 3.5–5.1)
Sodium: 140 mmol/L (ref 135–145)

## 2024-01-04 LAB — TYPE AND SCREEN
ABO/RH(D): AB POS
Antibody Screen: NEGATIVE

## 2024-01-04 LAB — CBC
HCT: 27.1 % — ABNORMAL LOW (ref 36.0–46.0)
Hemoglobin: 8.4 g/dL — ABNORMAL LOW (ref 12.0–15.0)
MCH: 30 pg (ref 26.0–34.0)
MCHC: 31 g/dL (ref 30.0–36.0)
MCV: 96.8 fL (ref 80.0–100.0)
Platelets: 175 10*3/uL (ref 150–400)
RBC: 2.8 MIL/uL — ABNORMAL LOW (ref 3.87–5.11)
RDW: 13.7 % (ref 11.5–15.5)
WBC: 6.7 10*3/uL (ref 4.0–10.5)
nRBC: 0 % (ref 0.0–0.2)

## 2024-01-04 MED ORDER — LACTATED RINGERS IV BOLUS
1000.0000 mL | Freq: Once | INTRAVENOUS | Status: AC
  Administered 2024-01-04: 1000 mL via INTRAVENOUS

## 2024-01-04 MED ORDER — PREDNISONE 20 MG PO TABS
20.0000 mg | ORAL_TABLET | Freq: Every day | ORAL | Status: DC
  Administered 2024-01-05 – 2024-01-09 (×5): 20 mg via ORAL
  Filled 2024-01-04 (×5): qty 1

## 2024-01-04 MED ORDER — SODIUM CHLORIDE 0.9 % IV SOLN
INTRAVENOUS | Status: AC
Start: 2024-01-04 — End: 2024-01-05

## 2024-01-04 MED ORDER — LIDOCAINE 5 % EX PTCH
1.0000 | MEDICATED_PATCH | Freq: Once | CUTANEOUS | Status: AC
  Administered 2024-01-04: 1 via TRANSDERMAL
  Filled 2024-01-04: qty 1

## 2024-01-04 MED ORDER — METHYLPREDNISOLONE SODIUM SUCC 125 MG IJ SOLR
80.0000 mg | Freq: Once | INTRAMUSCULAR | Status: AC
  Administered 2024-01-04: 80 mg via INTRAVENOUS
  Filled 2024-01-04: qty 2

## 2024-01-04 MED ORDER — SODIUM CHLORIDE 0.9 % IV BOLUS
500.0000 mL | Freq: Once | INTRAVENOUS | Status: AC
  Administered 2024-01-04: 500 mL via INTRAVENOUS

## 2024-01-04 MED ORDER — HYDROMORPHONE HCL 1 MG/ML IJ SOLN
1.0000 mg | Freq: Four times a day (QID) | INTRAMUSCULAR | Status: DC | PRN
  Administered 2024-01-04 – 2024-01-05 (×2): 1 mg via INTRAVENOUS
  Filled 2024-01-04 (×2): qty 1

## 2024-01-04 NOTE — Assessment & Plan Note (Signed)
 01-04-2024 awaiting this AM HgB. May need to give PRBC transfusion. Pt verbally consented for PRBC if needed.

## 2024-01-04 NOTE — Evaluation (Signed)
 Physical Therapy Evaluation Patient Details Name: Valerie [name] MRN: [id number] DOB: [birth year] Today's Date: 01/04/2024  History of Present Illness  The pt is a 63 yo female presenting 5/11 after a fall in which she landed on her R knee. Pt s/p R TKA on 10/19/23, and found to have periprosthetic distal femur fx. Pt now s/p ORIF R distal femur fx on 5/12. PMH includes: anemia, bipolar disorder, CKD III, dementia, depression, DM II, neuropathy, NSTEMI, OSA, R TKA, bilateral THA, and R foot surgery.   Clinical Impression  Pt in bed upon arrival of PT, agreeable to evaluation at this time. Prior to admission the pt was ambulating generally with independence, but does report intermittent use of cane in community and no falls other than this one since UTI after initial knee replacement. The pt was agreeable to session despite significant pain as she has been unable to receive pain medications all day due to soft BP. The pt required modA to manage RLE and trunk for bed mobility, and modA of 2 with RW to attempt sit-stand from EOB. Pt standing tolerance limited to ~10 sec due to pain, but BP taken with each change in position was very stable (see below) and pt states this is her normal BP. Given significant need for assistance with all changes in position and dependence on assist of 2 for OOB mobility, recommend intensive therapies after d/c to facilitate return to independence. Will continue to assess mobility as pain can be better controlled, but pt is dramatically below her baseline function and will need significant rehab to return to home alone at this time.   VITALS:  - supine in bed- BP: 86/65 (72); HR: 65bpm - sitting EOB - BP: 86/63 (70); HR: 77bpm - sitting post-stand - BP: 95/62 (72); HR: 65bpm  SpO2 95-97% on RA      If plan is discharge home, recommend the following: Two people to help with walking and/or transfers;A lot of help with bathing/dressing/bathroom;Assistance with  cooking/housework;Assist for transportation;Help with stairs or ramp for entrance   Can travel by private vehicle        Equipment Recommendations None recommended by PT  Recommendations for Other Services  Rehab consult    Functional Status Assessment Patient has had a recent decline in their functional status and demonstrates the ability to make significant improvements in function in a reasonable and predictable amount of time.     Precautions / Restrictions Precautions Precautions: Fall;Knee Recall of Precautions/Restrictions: Intact Precaution/Restrictions Comments: pt with recall of no pillow under knee from last surgery Restrictions Weight Bearing Restrictions Per Provider Order: Yes RLE Weight Bearing Per Provider Order: Weight bearing as tolerated      Mobility  Bed Mobility Overal bed mobility: Needs Assistance Bed Mobility: Supine to Sit, Sit to Supine     Supine to sit: Mod assist, +2 for safety/equipment, Used rails Sit to supine: Mod assist, +2 for physical assistance   General bed mobility comments: assist under RLE to maintain position and advance laterally. pt using LLE well to scoot to EOB. assist at trunk and for pt to pull on therapist to sit EOB. assist at pad to scoot to EOB    Transfers Overall transfer level: Needs assistance Equipment used: Rolling walker (2 wheels) Transfers: Sit to/from Stand Sit to Stand: Mod assist, +2 physical assistance           General transfer comment: modA of 2 with bed pad to improve hip lift and clearance. standing tolerance limited to ~  10 seconds due to pain    Ambulation/Gait               General Gait Details: pt unable due to pain      Balance Overall balance assessment: Needs assistance Sitting-balance support: Bilateral upper extremity supported, Feet supported Sitting balance-Leahy Scale: Fair     Standing balance support: Bilateral upper extremity supported, During functional activity,  Reliant on assistive device for balance Standing balance-Leahy Scale: Poor Standing balance comment: dependent on BUE support and assist of 2                             Pertinent Vitals/Pain Pain Assessment Pain Assessment: 0-10 Pain Score: 10-Worst pain ever Pain Location: R knee and thigh Pain Descriptors / Indicators: Discomfort, Grimacing, Guarding, Sore Pain Intervention(s): Limited activity within patient's tolerance, Monitored during session, Repositioned, Patient requesting pain meds-RN notified    Home Living Family/patient expects to be discharged to:: Private residence Living Arrangements: Alone Available Help at Discharge: Family;Available 24 hours/day Type of Home: Other(Comment) (town house) Home Access: Stairs to enter Entrance Stairs-Rails: None Secretary/administrator of Steps: 1 Alternate Level Stairs-Number of Steps: 1 flight Home Layout: Two level;1/2 bath on main level;Able to live on main level with bedroom/bathroom Home Equipment: Rolling Walker (2 wheels);BSC/3in1;Cane - single point;Rollator (4 wheels);Grab bars - tub/shower;Shower seat;Wheelchair - manual Additional Comments: sister and friends can assist as needed    Prior Function Prior Level of Function : Independent/Modified Independent;History of Falls (last six months);Driving             Mobility Comments: using cane for mobility in commuinty, rollator in home ADLs Comments: independent ADLs, light IADLs, driving; grocery delivery     Extremity/Trunk Assessment   Upper Extremity Assessment Upper Extremity Assessment: Defer to OT evaluation    Lower Extremity Assessment Lower Extremity Assessment: RLE deficits/detail RLE Deficits / Details: limited due to pain, able to tolerate partial ROM of flexion, unable to reach full extension. limited activation of quad (likely pain) and reports dulled sensation of R toes RLE: Unable to fully assess due to pain RLE Sensation: decreased  light touch;history of peripheral neuropathy    Cervical / Trunk Assessment Cervical / Trunk Assessment: Normal;Other exceptions Cervical / Trunk Exceptions: increased body habitus  Communication   Communication Communication: No apparent difficulties    Cognition Arousal: Alert Behavior During Therapy: WFL for tasks assessed/performed   PT - Cognitive impairments: No apparent impairments                       PT - Cognition Comments: pt following commands well this session. able to recall knee precautions from last surgery, not formally assessed Following commands: Intact       Cueing Cueing Techniques: Verbal cues     General Comments General comments (skin integrity, edema, etc.): BP soft but stable, fluid bolus running. SpO2 stable on RA 95-97% through session    Exercises     Assessment/Plan    PT Assessment Patient needs continued PT services  PT Problem List Decreased range of motion;Decreased strength;Decreased activity tolerance;Decreased balance;Decreased mobility;Obesity;Pain       PT Treatment Interventions DME instruction;Gait training;Stair training;Functional mobility training;Therapeutic activities;Therapeutic exercise;Balance training;Patient/family education    PT Goals (Current goals can be found in the Care Plan section)  Acute Rehab PT Goals Patient Stated Goal: progress to being able to go home with follow up therapy at home PT  Goal Formulation: With patient/family Time For Goal Achievement: 01/18/24 Potential to Achieve Goals: Good    Frequency Min 2X/week     Co-evaluation PT/OT/SLP Co-Evaluation/Treatment: Yes Reason for Co-Treatment: Complexity of the patient's impairments (multi-system involvement);Necessary to address cognition/behavior during functional activity;For patient/therapist safety;To address functional/ADL transfers PT goals addressed during session: Mobility/safety with mobility;Balance;Proper use of  DME;Strengthening/ROM         AM-PAC PT "6 Clicks" Mobility  Outcome Measure Help needed turning from your back to your side while in a flat bed without using bedrails?: A Lot Help needed moving from lying on your back to sitting on the side of a flat bed without using bedrails?: A Lot Help needed moving to and from a bed to a chair (including a wheelchair)?: Total Help needed standing up from a chair using your arms (e.g., wheelchair or bedside chair)?: Total Help needed to walk in hospital room?: Total Help needed climbing 3-5 steps with a railing? : Total 6 Click Score: 8    End of Session Equipment Utilized During Treatment: Gait belt Activity Tolerance: Patient limited by pain Patient left: in bed;with call bell/phone within reach;with bed alarm set;with family/visitor present Nurse Communication: Mobility status;Other (comment);Patient requests pain meds (vitals) PT Visit Diagnosis: Unsteadiness on feet (R26.81);Other abnormalities of gait and mobility (R26.89);Muscle weakness (generalized) (M62.81);History of falling (Z91.81);Pain Pain - Right/Left: Right Pain - part of body: Knee    Time: 1478-2956 PT Time Calculation (min) (ACUTE ONLY): 30 min   Charges:   PT Evaluation $PT Eval Moderate Complexity: 1 Mod   PT General Charges $$ ACUTE PT VISIT: 1 Visit         Barnabas Booth, PT, DPT   Acute Rehabilitation Department Office [phone] Secure Chat Communication Preferred  Valerie [name] 01/04/2024, 9:57 AM

## 2024-01-04 NOTE — TOC Initial Note (Addendum)
 Transition of Care [facility]) - Initial/Assessment Note    Patient Details  Name: Valerie [name] MRN: [id number] Date of Birth: 03-26-1961  [facility] CM/SW Contact:    Elspeth Hals, LCSW Phone Number: 01/04/2024, 3:10 PM  Clinical Narrative:   CSW met with pt, daughter Hayden Lipoma, sister Caryl Clas, and neighbor Jullie Oiler regarding DC plan. Permission given to speak with all of them present.   Discussed that [facility] will not be able to accept pt, discussed SNF option.  Pt is agreeable to SNF, has been before several times.  Daughter is in [place] and interested in [place] [facility] or [employer] as well as [place] options.  Medicare choice documents provided for [place] and [place].   Pt from home alone, no current services.     P[employer]SR requires additional info.  Referral sent out in hub for SNF. Referral faxed to [place] [facility].               Expected Discharge Plan: Skilled Nursing Facility Barriers to Discharge: Continued Medical Work up, SNF Pending bed offer   Patient Goals and CMS Choice Patient states their goals for this hospitalization and ongoing recovery are:: get back to normal living CMS Medicare.gov Compare Post Acute Care list provided to:: Patient Represenative (must comment) (daughter Hayden Lipoma) Choice offered to / list presented to : Patient, Adult Children      Expected Discharge Plan and Services In-house Referral: Clinical Social Work   Post Acute Care Choice: Skilled Nursing Facility Living arrangements for the past 2 months: Single Family Home                                      Prior Living Arrangements/Services Living arrangements for the past 2 months: Single Family Home Lives with:: Self Patient language and need for interpreter reviewed:: Yes Do you feel safe going back to the place where you live?: Yes      Need for Family Participation in Patient Care: Yes (Comment) Care giver support system in place?: Yes  (comment) Current home services: Other (comment) (none) Criminal Activity/Legal Involvement Pertinent to Current Situation/Hospitalization: No - Comment as needed  Activities of Daily Living   ADL Screening (condition at time of admission) Independently performs ADLs?: No Does the patient have a NEW difficulty with bathing/dressing/toileting/self-feeding that is expected to last >3 days?: No Does the patient have a NEW difficulty with getting in/out of bed, walking, or climbing stairs that is expected to last >3 days?: No Does the patient have a NEW difficulty with communication that is expected to last >3 days?: No Is the patient deaf or have difficulty hearing?: No Does the patient have difficulty seeing, even when wearing glasses/contacts?: No Does the patient have difficulty concentrating, remembering, or making decisions?: No  Permission Sought/Granted Permission sought to share information with : Family Supports Permission granted to share information with : Yes, Verbal Permission Granted  Share Information with NAME: sister Caryl Clas, daughter Hayden Lipoma  Permission granted to share info w AGENCY: SNF        Emotional Assessment Appearance:: Appears stated age Attitude/Demeanor/Rapport: Engaged Affect (typically observed): Appropriate, Pleasant Orientation: : Oriented to Self, Oriented to Place, Oriented to  Time, Oriented to Situation      Admission diagnosis:  Closed fracture of right distal femur (HCC) [S72.401A] Fall, initial encounter [W19.XXXA] Closed fracture of distal end of right femur, unspecified fracture morphology, initial encounter [facility]) [S72.401A] Patient  Active Problem List   Diagnosis Date Noted   Acute postoperative anemia due to expected blood loss 01/04/2024   Periprosthetic fracture around internal prosthetic right knee joint 01/02/2024   Primary localized osteoarthritis of right knee 10/19/2023   Seizure disorder (HCC) 07/28/2023   Parkinson disease (HCC)  07/28/2023   Aortic atherosclerosis (HCC) 07/28/2023   Primary osteoarthritis involving multiple joints 06/20/2023   Long term (current) use of systemic steroids 06/20/2023   High risk medication use 05/12/2023   Vitamin D  deficiency 05/12/2023   Tremor 02/11/2023   Fall 02/11/2023   Candidal skin infection 12/04/2022   Urinary incontinence 12/04/2022   GERD (gastroesophageal reflux disease) 10/22/2022   Hyperlipidemia associated with type 2 diabetes mellitus (HCC) 10/22/2022   Hyperparathyroidism (HCC) 12/03/2020   Multiple thyroid  nodules 12/03/2020   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   Acute kidney injury superimposed on stage 3b chronic kidney disease (HCC)    Anemia, chronic disease 07/18/2020   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 07/18/2020   CAD (coronary artery disease) 06/04/2020   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 05/20/2020   DM type 2 (diabetes mellitus, type 2) (HCC) 11/14/2019   Anxiety    Obesity, Class II, BMI 35-39.9    Chronic kidney disease, stage 3b (HCC) - baseline Scr 1.4    Psoriatic arthritis (HCC)    Tobacco abuse    OSA (obstructive sleep apnea)    Neuropathy    Bipolar disorder (HCC)    Osteoarthritis of right hip 11/05/2014   Essential hypertension 10/29/2014   PCP:  Vevelyn Gowers, NP Pharmacy:   [id number] [place] Nestle, [place] - [address] AT [facility] & [address] [place] [address] Phone: [phone] Fax: [phone]     Social Drivers of Health (SDOH) Social History: SDOH Screenings   Food Insecurity: No Food Insecurity (01/02/2024)  Housing: Low Risk  (01/02/2024)  Transportation Needs: No Transportation Needs (01/02/2024)  Utilities: Not At Risk (01/02/2024)  Alcohol Screen: Low Risk  (12/04/2022)  Depression (PHQ2-9): Medium Risk (09/21/2023)  Financial Resource Strain: High Risk (01/11/2023)   Physical Activity: Inactive (09/21/2023)  Social Connections: Socially Isolated (10/21/2023)  Stress: Stress Concern Present (09/21/2023)  Tobacco Use: Medium Risk (01/03/2024)   SDOH Interventions:     Readmission Risk Interventions     No data to display

## 2024-01-04 NOTE — Progress Notes (Signed)
   Repeat HgB 8.4 g/dl. She does not need PRBC transfusion. Will give her some stress dose steroids.  Give IV solumedrol 80 mg once and increase her daily prednisone  to 20 mg daily.  Unk Garb, DO [facility]

## 2024-01-04 NOTE — Assessment & Plan Note (Signed)
 01-04-2024 on chronic prednisone  5 mg daily. Pt did received 10 mg IV decadron  with surgery yesterday. If BP remains borderline and pt does not need PRBC transfusion, will consider stress dose steroids.

## 2024-01-04 NOTE — Plan of Care (Signed)

## 2024-01-04 NOTE — Progress Notes (Signed)
 PROGRESS NOTE    Valerie [name]  ZOX:[id number] DOB: Mar 01, 1961 DOA: 01/02/2024 PCP: Vevelyn Gowers, NP  Subjective: Pt seen and examined. Had ORIF of periprosthetic fracture. Is WBAT. Lives at home alone in [place]. She had friend stay with her when she had right TKA last year. Pt plans on returning home and having same friend stay with her at home.   Hospital Course: HPI: Valerie [name] is a 63 y.o. female with medical history significant for anxiety/depression, NSTEMI in 2021, diabetes, CKD stage IIIB, peripheral neuropathy, bipolar disorder, hypothyroidism, psoriatic arthritis, CVA, dementia, OSA and a recent right total knee replacement in February 2025 by Dr. Agatha Horsfall who presented to the ER for evaluation of right leg pain after a fall.  Patient reports she was out in downtown eating for Mother's Day. Afterwards, a friend was picking her up and as she was walking, she tripped over a curb, fell and landed directly on her right knee.  Started having severe right leg pain and noticed deformity of the proximal part of her right knee.  She was unable to bear weight so some bystanders picked her up and her friend drove her to the ED.  She denies any dizziness, headaches, vision changes, fevers, chills, chest pain, syncope or leg swelling.  Reports a history of falls with her last fall in February 2025 prior to her knee replacement.  She is also scheduled for back surgery on May/29.   Significant Events: Admitted 01/02/2024 for right periprosthetic fracture   Significant Labs: Na 139, K 4.0, CO2 of 19, BUN 26, Scr 1.68, glu 154 WBC 9.3, HgB 11.3, Plt 237  Significant Imaging Studies: Right Femur XR Acute displaced and overriding distal femur fracture extending to the femoral component of the knee arthroplasty. 2. No acute fracture of the pelvis.  Intact hip arthroplasties.  Antibiotic Therapy: Anti-infectives (From admission, onward)    Start     Dose/Rate Route Frequency Ordered Stop    01/03/24 1600  ceFAZolin  (ANCEF ) IVPB 2g/100 mL premix        2 g 200 mL/hr over 30 Minutes Intravenous Every 8 hours 01/03/24 0949 01/04/24 1559   01/03/24 0815  vancomycin (VANCOCIN) powder  Status:  Discontinued          As needed 01/03/24 0815 01/03/24 0859   01/03/24 0600  ceFAZolin  (ANCEF ) IVPB 2g/100 mL premix        2 g 200 mL/hr over 30 Minutes Intravenous On call to O.R. 01/02/24 1829 01/03/24 0752   01/03/24 0000  fluconazole  (DIFLUCAN ) tablet 150 mg  Status:  Discontinued        150 mg Oral Every 3 DAYS 01/02/24 1718 01/02/24 1730       Procedures: 01-03-2024 ORIF right periprosthetic fracture  Consultants: ortho    Assessment and Plan: * Periprosthetic fracture around internal prosthetic right knee joint 01-03-2024 s/p ORIF today. WBAT as tolerated. Lovenox  for DVT prophylaxis while in the hospital. Ortho wants DOAC at discharge. Will defer to ortho which DOAC they want. Continue prn IV dilaudid , prn oxycodone  for pain. Hopefully home in next 1-2 days.  Lives at home alone in [place]. She had friend stay with her when she had right TKA last year. Pt plans on returning home and having same friend stay with her at home.   01-04-2024 pt still having pain. Awaiting PT consult. May need SNF placement.  Acute postoperative anemia due to expected blood loss 01-04-2024 awaiting this AM HgB. May need to give PRBC transfusion. Pt verbally  consented for PRBC if needed.  Acute kidney injury superimposed on stage 3b chronic kidney disease (HCC) 01-03-2024 continue IVF. baseline Scr 1.4   01-04-2024 continue IVF due to hypotension.  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 01-04-2024 on chronic prednisone  5 mg daily. Pt did received 10 mg IV decadron  with surgery yesterday. If BP remains borderline and pt does not need PRBC transfusion, will consider stress dose steroids.  Fall 01-03-2024 sustained right periprosthetic fracture. S/p ORIF  today.  01-04-2024 stable. Needs PT eval. May need SNF placement.  Hyperlipidemia associated with type 2 diabetes mellitus (HCC) 01-03-2024 on crestor , lovaza  01-04-2024 stable  Essential hypertension 01-03-2024 on hydralazine  prn.   DM type 2 (diabetes mellitus, type 2) (HCC) 01-03-2024 on SSI.  01-04-2024 pt refusing SSI an diabetic diet.  Chronic kidney disease, stage 3b (HCC) - baseline Scr 1.4 01-03-2024 baseline Scr 1.4   01-04-2024 Scr at baseline of 1.43. BUN 18  Obesity, Class II, BMI 35-39.9 Body mass index is 36.58 kg/m.   DVT prophylaxis: enoxaparin  (LOVENOX ) injection 40 mg Start: 01/04/24 0800 SCDs Start: 01/03/24 0949 SCDs Start: 01/02/24 1628    Code Status: Full Code Family Communication: no family at bedside Disposition Plan: unknown. Home with HH vs SNF Reason for continuing need for hospitalization: BP low today. May need PRBC transfusion. Remains on IVF. Awaiting PT consult.  Objective: Vitals:   01/04/24 0050 01/04/24 0100 01/04/24 0257 01/04/24 0442  BP: (!) 74/65 (!) 91/41 (!) 84/62 96/68  Pulse:   (!) 58 61  Resp:   17 17  Temp:   97.7 F (36.5 C) (!) 97.5 F (36.4 C)  TempSrc:   Oral Oral  SpO2:   100% 96%  Weight:      Height:        Intake/Output Summary (Last 24 hours) at 01/04/2024 0811 Last data filed at 01/04/2024 0508 Gross per 24 hour  Intake 1431.15 ml  Output 1750 ml  Net -318.85 ml   Filed Weights   01/02/24 1331 01/03/24 0659  Weight: 95.3 kg 90.7 kg    Examination:  Physical Exam Vitals and nursing note reviewed.  Constitutional:      General: She is not in acute distress.    Appearance: She is obese. She is not toxic-appearing.  HENT:     Head: Normocephalic and atraumatic.     Nose: Nose normal.  Cardiovascular:     Rate and Rhythm: Normal rate and regular rhythm.  Pulmonary:     Effort: Pulmonary effort is normal.     Breath sounds: Normal breath sounds.  Abdominal:     General: Abdomen is  protuberant. Bowel sounds are normal. There is no distension.     Palpations: Abdomen is soft.  Skin:    General: Skin is warm and dry.     Capillary Refill: Capillary refill takes less than 2 seconds.  Neurological:     General: No focal deficit present.     Mental Status: She is alert and oriented to person, place, and time.     Data Reviewed: I have personally reviewed following labs and imaging studies  CBC: Recent Labs  Lab 01/02/24 1419 01/03/24 0441 01/03/24 1840  WBC 9.3 7.1 9.1  NEUTROABS 7.4  --   --   HGB 11.3* 10.3* 8.3*  HCT 37.8 32.8* 26.5*  MCV 99.5 97.0 96.7  PLT 237 190 170   Basic Metabolic Panel: Recent Labs  Lab 01/02/24 1419 01/03/24 0441 01/04/24 0715  NA  139 139 140  K 4.0 4.8 4.1  CL 104 104 106  CO2 19* 26 27  GLUCOSE 154* 108* 101*  BUN 26* 24* 18  CREATININE 1.68* 1.50* 1.43*  CALCIUM  8.6* 7.8* 7.9*   GFR: Estimated Creatinine Clearance: 41.6 mL/min (A) (by C-G formula based on SCr of 1.43 mg/dL (H)). Liver Function Tests: Recent Labs  Lab 01/02/24 1419  AST 23  ALT 6  ALKPHOS 91  BILITOT 0.5  PROT 6.5  ALBUMIN 3.6   CBG: Recent Labs  Lab 01/02/24 2104 01/03/24 0704 01/03/24 0903 01/03/24 1148  GLUCAP 104* 105* 122* 155*   Recent Results (from the past 240 hours)  MRSA Next Gen by PCR, Nasal     Status: None   Collection Time: 01/02/24  9:37 PM   Specimen: Nasal Mucosa; Nasal Swab  Result Value Ref Range Status   MRSA by PCR Next Gen NOT DETECTED NOT DETECTED Final    Comment: (NOTE) The GeneXpert MRSA Assay (FDA approved for NASAL specimens only), is one component of a comprehensive MRSA colonization surveillance program. It is not intended to diagnose MRSA infection nor to guide or monitor treatment for MRSA infections. Test performance is not FDA approved in patients less than 40 years old. Performed at [facility], [address], [place], [place] [address]      Radiology Studies: DG FEMUR PORT, MIN  2 VIEWS RIGHT Result Date: 01/03/2024 CLINICAL DATA:  Fracture, postop. EXAM: RIGHT FEMUR PORTABLE 2 VIEW COMPARISON:  Preoperative imaging FINDINGS: Lateral plate and screw fixation of comminuted distal femur fracture. Improved fracture alignment from preoperative imaging. Previous knee and hip arthroplasty. Recent postsurgical change includes air and edema in the soft tissues. IMPRESSION: ORIF of comminuted distal femur fracture. Electronically Signed   By: Chadwick Colonel M.D.   On: 01/03/2024 11:39   DG FEMUR, MIN 2 VIEWS RIGHT Result Date: 01/03/2024 CLINICAL DATA:  Elective surgery. EXAM: RIGHT FEMUR 2 VIEWS COMPARISON:  Preoperative imaging FINDINGS: Five fluoroscopic spot views of the right femur submitted from the operating room. Lateral plate and screw fixation of comminuted distal femur fracture. Previous knee and hip arthroplasty. Fluoroscopy time 57.5 seconds. Dose 5.87 mGy. IMPRESSION: Intraoperative fluoroscopy during distal femur fracture fixation. Electronically Signed   By: Chadwick Colonel M.D.   On: 01/03/2024 11:38   DG C-Arm 1-60 Min-No Report Result Date: 01/03/2024 Fluoroscopy was utilized by the requesting physician.  No radiographic interpretation.   DG Chest Port 1 View Result Date: 01/02/2024 CLINICAL DATA:  Preop, femur fracture EXAM: PORTABLE CHEST 1 VIEW COMPARISON:  Radiograph 10/21/2023 FINDINGS: Stable mild cardiomegaly.The cardiomediastinal contours are unchanged. The lungs are clear. Pulmonary vasculature is normal. No consolidation, pleural effusion, or pneumothorax. Remote right rib fracture. No acute osseous abnormalities are seen. IMPRESSION: Stable mild cardiomegaly. No acute chest findings. Electronically Signed   By: Chadwick Colonel M.D.   On: 01/02/2024 14:55   DG Femur Min 2 Views Right Result Date: 01/02/2024 CLINICAL DATA:  Trip and fall with injury to the right lower extremity. EXAM: PELVIS - 1-2 VIEW; RIGHT FEMUR 2 VIEWS; RIGHT KNEE - COMPLETE 4+ VIEW  COMPARISON:  None Available. FINDINGS: Pelvis: Bilateral hip arthroplasties are intact were visualized. No periprosthetic fracture. No acute fracture of the pelvis. Pubic rami are intact. Femur: Periprosthetic fracture will be described below. The femoral shaft is intact. Knee: Knee arthroplasty is in place. Acute distal femur fracture extends to the femoral component with significant displacement. Osseous overriding is at least 5  cm. The tibial and femoral components appear congruent. IMPRESSION: 1. Acute displaced and overriding distal femur fracture extending to the femoral component of the knee arthroplasty. 2. No acute fracture of the pelvis.  Intact hip arthroplasties. Electronically Signed   By: Chadwick Colonel M.D.   On: 01/02/2024 14:54   DG Knee Complete 4 Views Right Result Date: 01/02/2024 CLINICAL DATA:  Trip and fall with injury to the right lower extremity. EXAM: PELVIS - 1-2 VIEW; RIGHT FEMUR 2 VIEWS; RIGHT KNEE - COMPLETE 4+ VIEW COMPARISON:  None Available. FINDINGS: Pelvis: Bilateral hip arthroplasties are intact were visualized. No periprosthetic fracture. No acute fracture of the pelvis. Pubic rami are intact. Femur: Periprosthetic fracture will be described below. The femoral shaft is intact. Knee: Knee arthroplasty is in place. Acute distal femur fracture extends to the femoral component with significant displacement. Osseous overriding is at least 5 cm. The tibial and femoral components appear congruent. IMPRESSION: 1. Acute displaced and overriding distal femur fracture extending to the femoral component of the knee arthroplasty. 2. No acute fracture of the pelvis.  Intact hip arthroplasties. Electronically Signed   By: Chadwick Colonel M.D.   On: 01/02/2024 14:54   DG Pelvis 1-2 Views Result Date: 01/02/2024 CLINICAL DATA:  Trip and fall with injury to the right lower extremity. EXAM: PELVIS - 1-2 VIEW; RIGHT FEMUR 2 VIEWS; RIGHT KNEE - COMPLETE 4+ VIEW COMPARISON:  None Available.  FINDINGS: Pelvis: Bilateral hip arthroplasties are intact were visualized. No periprosthetic fracture. No acute fracture of the pelvis. Pubic rami are intact. Femur: Periprosthetic fracture will be described below. The femoral shaft is intact. Knee: Knee arthroplasty is in place. Acute distal femur fracture extends to the femoral component with significant displacement. Osseous overriding is at least 5 cm. The tibial and femoral components appear congruent. IMPRESSION: 1. Acute displaced and overriding distal femur fracture extending to the femoral component of the knee arthroplasty. 2. No acute fracture of the pelvis.  Intact hip arthroplasties. Electronically Signed   By: Chadwick Colonel M.D.   On: 01/02/2024 14:54    Scheduled Meds:  acetaminophen   1,000 mg Oral TID   carbidopa -levodopa   1 tablet Oral BID   docusate sodium   100 mg Oral BID   donepezil   10 mg Oral Daily   enoxaparin  (LOVENOX ) injection  40 mg Subcutaneous Q24H   FLUoxetine   80 mg Oral Daily   gabapentin   300 mg Oral q morning   And   gabapentin   600 mg Oral QHS   lamoTRIgine   150 mg Oral BID   levETIRAcetam   750 mg Oral BID   lidocaine   1 patch Transdermal Once   memantine   10 mg Oral BID   multivitamin with minerals  1 tablet Oral QHS   omega-3 acid ethyl esters  1 g Oral Daily   oxybutynin   5 mg Oral BID   pantoprazole   40 mg Oral Daily   predniSONE   5 mg Oral Q breakfast   primidone   100 mg Oral BID   rosuvastatin   10 mg Oral QHS   senna-docusate  2 tablet Oral Daily   tiZANidine  4 mg Oral TID   traZODone   200 mg Oral QHS   Continuous Infusions:   ceFAZolin  (ANCEF ) IV 2 g (01/04/24 0301)   lactated ringers        LOS: 2 days   Time spent: 50 minutes  Unk Garb, DO  [facility]  01/04/2024, 8:11 AM

## 2024-01-04 NOTE — Progress Notes (Signed)
 Orthopaedic Trauma Progress Note  SUBJECTIVE: Doing fair this morning.  Notes severe pain in the right lower extremity, primarily located around the knee and up into the thigh.  Administration of pain medications has been limited secondary to hypotension.  Patient has received several fluid boluses.  Plan is for another fluid bolus now with pain meds to follow.  Denies any significant numbness or tingling throughout the leg.  No chest pain. No SOB. No nausea/vomiting. No other complaints.  Has not been up out of bed yet since surgery.  Tolerating diet and fluids but does not have much of an appetite.  Did eat a banana this morning. Patient lives at home alone in a townhouse.  Plan is for her to return home and have some friends stay with her at discharge.  OBJECTIVE:  Vitals:   01/04/24 0442 01/04/24 0923  BP: 96/68 (!) 87/45  Pulse: 61 65  Resp: 17 17  Temp: (!) 97.5 F (36.4 C) 97.7 F (36.5 C)  SpO2: 96% (!) 85%    Opiates Today (MME): Today's  total administered Morphine  Milligram Equivalents: 0 Opiates Yesterday (MME): Yesterday's total administered Morphine  Milligram Equivalents: 90  General: Sitting up in bed, no acute distress. Respiratory: No increased work of breathing.  Operative Extremity (right lower extremity): Dressing clean, dry, intact.  Soreness throughout the distal thigh as expected.  Less tender throughout the lower leg, ankle, foot.  Ankle dorsiflexion/plantarflexion intact.  Endorses sensation of light touch over all aspects of the foot.+ DP pulse  IMAGING: Stable post op imaging.   LABS:  Results for orders placed or performed during the hospital encounter of 01/02/24 (from the past 24 hours)  Glucose, capillary     Status: Abnormal   Collection Time: 01/03/24 11:48 AM  Result Value Ref Range   Glucose-Capillary 155 (H) 70 - 99 mg/dL  CBC     Status: Abnormal   Collection Time: 01/03/24  6:40 PM  Result Value Ref Range   WBC 9.1 4.0 - 10.5 K/uL   RBC 2.74  (L) 3.87 - 5.11 MIL/uL   Hemoglobin 8.3 (L) 12.0 - 15.0 g/dL   HCT 81.1 (L) 91.4 - 78.2 %   MCV 96.7 80.0 - 100.0 fL   MCH 30.3 26.0 - 34.0 pg   MCHC 31.3 30.0 - 36.0 g/dL   RDW 95.6 21.3 - 08.6 %   Platelets 170 150 - 400 K/uL   nRBC 0.0 0.0 - 0.2 %  Type and screen [facility]     Status: None   Collection Time: 01/04/24  7:11 AM  Result Value Ref Range   ABO/RH(D) AB POS    Antibody Screen NEG    Sample Expiration      01/07/2024,2359 Performed at [facility], [address] [place], [place] [address]   CBC     Status: Abnormal   Collection Time: 01/04/24  7:15 AM  Result Value Ref Range   WBC 6.7 4.0 - 10.5 K/uL   RBC 2.80 (L) 3.87 - 5.11 MIL/uL   Hemoglobin 8.4 (L) 12.0 - 15.0 g/dL   HCT 96.2 (L) 95.2 - 84.1 %   MCV 96.8 80.0 - 100.0 fL   MCH 30.0 26.0 - 34.0 pg   MCHC 31.0 30.0 - 36.0 g/dL   RDW 32.4 40.1 - 02.7 %   Platelets 175 150 - 400 K/uL   nRBC 0.0 0.0 - 0.2 %  Basic metabolic panel with GFR     Status:  Abnormal   Collection Time: 01/04/24  7:15 AM  Result Value Ref Range   Sodium 140 135 - 145 mmol/L   Potassium 4.1 3.5 - 5.1 mmol/L   Chloride 106 98 - 111 mmol/L   CO2 27 22 - 32 mmol/L   Glucose, Bld 101 (H) 70 - 99 mg/dL   BUN 18 8 - 23 mg/dL   Creatinine, Ser 0.45 (H) 0.44 - 1.00 mg/dL   Calcium  7.9 (L) 8.9 - 10.3 mg/dL   GFR, Estimated 41 (L) >60 mL/min   Anion gap 7 5 - 15    ASSESSMENT: Valerie [name] is a 63 y.o. female, 1 Day Post-Op s/p mechanical fall Procedures: OPEN REDUCTION INTERNAL FIXATION RIGHT PERIPROSTHETIC FRACTURE  CV/Blood loss: Acute blood loss anemia, Hgb 8.4 this AM.  Fairly stable from postop yesterday.  Hemodynamically stable  PLAN: Weightbearing: WBAT RLE ROM: Okay for unrestricted hip and knee motion as tolerated Incisional and dressing care: Reinforce dressings as needed  Showering: Okay to get incisions wet starting 01/06/2024 Orthopedic device(s): None  Pain management:  1. Tylenol  1000 mg  3 times daily  2. Tizanidine 4 mg TID 3. Oxycodone  5-10 mg q 4 hours PRN 4. Dilaudid  1 mg q 6 hours PRN 5. Neurontin  300 mg in AM., 600 mg QHS VTE prophylaxis: Lovenox , SCDs ID:  Ancef  2gm post op Foley/Lines:  No foley, KVO IVFs Impediments to Fracture Healing: Vitamin D  level 52, no supplementation needed Dispo: PT/OT evaluation today.  Continue to work on blood pressure and pain control.  Okay for discharge from ortho standpoint once cleared by medicine team and therapies  D/C recommendations: - Oxycodone , tizanidine, Tylenol  for pain control - Eliquis 2.5 mg twice daily x 30 days for DVT prophylaxis - No additional need for Vit D supplementation  Follow - up plan: 2 weeks after d/c for wound check and repeat x-rays   Contact information:  Katheryne Pane MD, Alona Jamaica PA-C. After hours and holidays please check [email or web address] for group call information for [facility]   Edilia Gordon, PA-C [phone] (office) [email or web address]

## 2024-01-04 NOTE — [place] FL2 (Signed)
 [place]  MEDICAID FL2 LEVEL OF CARE FORM     IDENTIFICATION  Patient Name: Valerie [name] Birthdate: May 18, 1961 Sex: female Admission Date (Current Location): 01/02/2024  [facility] and [place] Number:  Producer, television/film/video and Address:  The . [facility], [address], [place], [place] [address]      Provider Number: [id number]  Attending Physician Name and Address:  Unk Garb, DO  Relative Name and Phone Number:  Karyn Pai [phone]    Current Level of Care: Hospital Recommended Level of Care: Skilled Nursing Facility Prior Approval Number:    Date Approved/Denied:   PASRR Number:    Discharge Plan: SNF    Current Diagnoses: Patient Active Problem List   Diagnosis Date Noted   Acute postoperative anemia due to expected blood loss 01/04/2024   Periprosthetic fracture around internal prosthetic right knee joint 01/02/2024   Primary localized osteoarthritis of right knee 10/19/2023   Seizure disorder (HCC) 07/28/2023   Parkinson disease (HCC) 07/28/2023   Aortic atherosclerosis (HCC) 07/28/2023   Primary osteoarthritis involving multiple joints 06/20/2023   Long term (current) use of systemic steroids 06/20/2023   High risk medication use 05/12/2023   Vitamin D  deficiency 05/12/2023   Tremor 02/11/2023   Fall 02/11/2023   Candidal skin infection 12/04/2022   Urinary incontinence 12/04/2022   GERD (gastroesophageal reflux disease) 10/22/2022   Hyperlipidemia associated with type 2 diabetes mellitus (HCC) 10/22/2022   Hyperparathyroidism (HCC) 12/03/2020   Multiple thyroid  nodules 12/03/2020   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   Acute kidney injury superimposed on stage 3b chronic kidney disease (HCC)    Anemia, chronic disease 07/18/2020   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 07/18/2020   CAD (coronary artery disease) 06/04/2020   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  07/17/2020 05/20/2020   DM type 2 (diabetes mellitus, type 2) (HCC) 11/14/2019   Anxiety    Obesity, Class II, BMI 35-39.9    Chronic kidney disease, stage 3b (HCC) - baseline Scr 1.4    Psoriatic arthritis (HCC)    Tobacco abuse    OSA (obstructive sleep apnea)    Neuropathy    Bipolar disorder (HCC)    Osteoarthritis of right hip 11/05/2014   Essential hypertension 10/29/2014    Orientation RESPIRATION BLADDER Height & Weight     Self, Time, Situation, Place  Normal Continent, External catheter Weight: 200 lb (90.7 kg) Height:  5\' 2"  (157.5 cm)  BEHAVIORAL SYMPTOMS/MOOD NEUROLOGICAL BOWEL NUTRITION STATUS    Convulsions/Seizures Continent Diet (see discharge summary)  AMBULATORY STATUS COMMUNICATION OF NEEDS Skin   Total Care Verbally Surgical wounds                       Personal Care Assistance Level of Assistance  Bathing, Feeding, Dressing Bathing Assistance: Maximum assistance Feeding assistance: Limited assistance Dressing Assistance: Maximum assistance     Functional Limitations Info  Sight, Hearing, Speech Sight Info: Adequate Hearing Info: Adequate Speech Info: Adequate    SPECIAL CARE FACTORS FREQUENCY  PT (By licensed PT), OT (By licensed OT)     PT Frequency: 5x week OT Frequency: 5x week            Contractures Contractures Info: Not present    Additional Factors Info  Code Status, Allergies Code Status Info: full Allergies Info: Carbamazepine, Sertraline Hcl           Current Medications (01/04/2024):  This is the current  hospital active medication list Current Facility-Administered Medications  Medication Dose Route Frequency Provider Last Rate Last Admin   acetaminophen  (TYLENOL ) tablet 1,000 mg  1,000 mg Oral TID Versie Gores, PA-C   1,000 mg at 01/04/24 0827   ALPRAZolam  (XANAX ) tablet 0.5 mg  0.5 mg Oral TID PRN Versie Gores, PA-C       carbidopa -levodopa  (SINEMET  IR) 25-100 MG per tablet immediate release 1 tablet  1  tablet Oral BID Versie Gores, PA-C   1 tablet at 01/04/24 0827   diphenhydrAMINE  (BENADRYL ) 12.5 MG/5ML elixir 12.5-25 mg  12.5-25 mg Oral Q4H PRN Versie Gores, PA-C       docusate sodium  (COLACE) capsule 100 mg  100 mg Oral BID Versie Gores, PA-C   100 mg at 01/04/24 7253   donepezil  (ARICEPT ) tablet 10 mg  10 mg Oral Daily Versie Gores, PA-C   10 mg at 01/04/24 0830   enoxaparin  (LOVENOX ) injection 40 mg  40 mg Subcutaneous Q24H Alona Jamaica A, PA-C   40 mg at 01/04/24 0827   FLUoxetine  (PROZAC ) capsule 80 mg  80 mg Oral Daily Versie Gores, PA-C   80 mg at 01/04/24 6644   gabapentin  (NEURONTIN ) capsule 300 mg  300 mg Oral q morning Versie Gores, PA-C   300 mg at 01/04/24 0347   And   gabapentin  (NEURONTIN ) capsule 600 mg  600 mg Oral QHS Versie Gores, PA-C   600 mg at 01/03/24 2206   hydrALAZINE  (APRESOLINE ) tablet 10 mg  10 mg Oral Q6H PRN Versie Gores, PA-C       HYDROmorphone  (DILAUDID ) injection 1 mg  1 mg Intravenous Q6H PRN Unk Garb, DO   1 mg at 01/04/24 1153   lamoTRIgine  (LAMICTAL ) tablet 150 mg  150 mg Oral BID Versie Gores, PA-C   150 mg at 01/04/24 4259   levETIRAcetam  (KEPPRA ) tablet 750 mg  750 mg Oral BID Versie Gores, PA-C   750 mg at 01/04/24 5638   lidocaine  (LIDODERM ) 5 % 1 patch  1 patch Transdermal Once Daniels, James K, NP   1 patch at 01/04/24 7564   memantine  (NAMENDA ) tablet 10 mg  10 mg Oral BID Versie Gores, PA-C   10 mg at 01/04/24 3329   metoCLOPramide (REGLAN) tablet 5-10 mg  5-10 mg Oral Q8H PRN Versie Gores, PA-C       Or   metoCLOPramide (REGLAN) injection 5-10 mg  5-10 mg Intravenous Q8H PRN Jonelle Neri, Sarah A, PA-C       multivitamin with minerals tablet 1 tablet  1 tablet Oral QHS Versie Gores, PA-C   1 tablet at 01/03/24 2204   omega-3 acid ethyl esters (LOVAZA) capsule 1 g  1 g Oral Daily Versie Gores, PA-C   1 g at 01/04/24 5188   ondansetron  (ZOFRAN ) tablet 4 mg  4 mg Oral Q6H PRN Versie Gores,  PA-C       Or   ondansetron  (ZOFRAN ) injection 4 mg  4 mg Intravenous Q6H PRN Versie Gores, PA-C       oxybutynin  (DITROPAN ) tablet 5 mg  5 mg Oral BID Alona Jamaica A, PA-C   5 mg at 01/04/24 4166   oxyCODONE  (Oxy IR/ROXICODONE ) immediate release tablet 5-10 mg  5-10 mg Oral Q4H PRN Versie Gores, PA-C   10 mg at 01/03/24 2201   pantoprazole  (PROTONIX ) EC tablet 40 mg  40 mg Oral Daily Alona Jamaica  A, PA-C   40 mg at 01/04/24 0829   polyethylene glycol (MIRALAX  / GLYCOLAX ) packet 17 g  17 g Oral Daily PRN Versie Gores, PA-C       [START ON 01/05/2024] predniSONE  (DELTASONE ) tablet 20 mg  20 mg Oral Q breakfast Unk Garb, DO       primidone  (MYSOLINE ) tablet 100 mg  100 mg Oral BID Versie Gores, PA-C   100 mg at 01/04/24 0833   rosuvastatin  (CRESTOR ) tablet 10 mg  10 mg Oral QHS Versie Gores, PA-C   10 mg at 01/03/24 2203   senna-docusate (Senokot-S) tablet 2 tablet  2 tablet Oral Daily Versie Gores, PA-C   2 tablet at 01/04/24 0981   tiZANidine (ZANAFLEX) tablet 4 mg  4 mg Oral TID Versie Gores, PA-C   4 mg at 01/04/24 1914   traZODone  (DESYREL ) tablet 200 mg  200 mg Oral QHS Versie Gores, PA-C   200 mg at 01/03/24 2205     Discharge Medications: Please see discharge summary for a list of discharge medications.  Relevant Imaging Results:  Relevant Lab Results:   Additional Information SSN [id number]  Gloria Lares, Merrie Abed, [place]

## 2024-01-04 NOTE — Progress Notes (Signed)
 Inpatient Rehab Admissions Coordinator:   Per therapy recommendations, patient was screened for CIR candidacy by Wandalee Gust, MS, CCC-SLP . At this time, Pt. does not appear to demonstrate medical necessity to justify in hospital rehabilitation/CIR. I also do not think payor will approve CIR for this diagnosis. I will not pursue a rehab consult for this Pt.   Recommend other rehab venues to be pursued.  Please contact me with any questions.  Wandalee Gust, MS, CCC-SLP Rehab Admissions Coordinator  [phone] (celll) [phone] (office)

## 2024-01-04 NOTE — Progress Notes (Signed)
 RE:  Valerie [name]       Date of Birth: 02/03/1961      Date:   01/04/24       To Whom It May Concern:  Please be advised that the above-named patient will require a short-term nursing home stay - anticipated 30 days or less for rehabilitation and strengthening.  The plan is for return home.                 MD signature                Date

## 2024-01-04 NOTE — Evaluation (Signed)
 Occupational Therapy Evaluation Patient Details Name: Valerie [name] MRN: [id number] DOB: 1960/09/03 Today's Date: 01/04/2024   History of Present Illness   The pt is a 63 yo female presenting 5/11 after a fall in which she landed on her R knee. Pt s/p R TKA on 10/19/23, and found to have periprosthetic distal femur fx. Pt now s/p ORIF R distal femur fx on 5/12. PMH includes: anemia, bipolar disorder, CKD III, dementia, depression, DM II, neuropathy, NSTEMI, OSA, R TKA, bilateral THA, and R foot surgery.     Clinical Impressions PTA patient independent with ADLs, light IADLs using cane for community mobility and rollator in her home. Admitted for above and presents with problem list below.  Pts BP soft but stable throughout session, requires +2 mod assist for bed mobility and sit to stand with very limited tolerance. Limited by pain in R LE.  She requires setup to max assist +2 for ADLs.  Recommend continued rehab after dc to optimize independence and tolerance for ADLs, reduce risk of falls; at this time recommend >3hrs/day inpatient setting. Will follow acutely.   BP supine 86/65 BP EOB 86/63 BP after standing 95/62   HR 65-77; SpO2 95-97% on RA      If plan is discharge home, recommend the following:   Two people to help with walking and/or transfers;A lot of help with bathing/dressing/bathroom;Assistance with cooking/housework;Assist for transportation;Help with stairs or ramp for entrance     Functional Status Assessment   Patient has had a recent decline in their functional status and demonstrates the ability to make significant improvements in function in a reasonable and predictable amount of time.     Equipment Recommendations   Other (comment) (defer)     Recommendations for Other Services   Rehab consult     Precautions/Restrictions   Precautions Precautions: Fall;Knee Recall of Precautions/Restrictions: Intact Precaution/Restrictions Comments: pt with  recall of no pillow under knee from last surgery Restrictions Weight Bearing Restrictions Per Provider Order: Yes RLE Weight Bearing Per Provider Order: Weight bearing as tolerated     Mobility Bed Mobility Overal bed mobility: Needs Assistance Bed Mobility: Supine to Sit, Sit to Supine     Supine to sit: Mod assist, +2 for safety/equipment, Used rails Sit to supine: Mod assist, +2 for physical assistance   General bed mobility comments: assist under RLE to maintain position and advance laterally. pt using LLE well to scoot to EOB. assist at trunk and for pt to pull on therapist to sit EOB. assist at pad to scoot to EOB    Transfers Overall transfer level: Needs assistance Equipment used: Rolling walker (2 wheels) Transfers: Sit to/from Stand Sit to Stand: Mod assist, +2 physical assistance           General transfer comment: modA of 2 with bed pad to improve hip lift and clearance. standing tolerance limited to ~10 seconds due to pain      Balance Overall balance assessment: Needs assistance Sitting-balance support: Bilateral upper extremity supported, Feet supported Sitting balance-Leahy Scale: Fair     Standing balance support: Bilateral upper extremity supported, During functional activity, Reliant on assistive device for balance Standing balance-Leahy Scale: Poor Standing balance comment: dependent on BUE support and assist of 2                           ADL either performed or assessed with clinical judgement   ADL Overall ADL's : Needs assistance/impaired  Grooming: Set up;Sitting           Upper Body Dressing : Set up;Sitting   Lower Body Dressing: Maximal assistance;+2 for physical assistance;Sit to/from stand Lower Body Dressing Details (indicate cue type and reason): assist for socks, +2 in standing and relies on BUE support   Toilet Transfer Details (indicate cue type and reason): deferred         Functional mobility during  ADLs: +2 for physical assistance;Moderate assistance       Vision   Vision Assessment?: No apparent visual deficits     Perception         Praxis         Pertinent Vitals/Pain Pain Assessment Pain Assessment: 0-10 Pain Score: 10-Worst pain ever Pain Location: R knee and thigh Pain Descriptors / Indicators: Discomfort, Grimacing, Guarding, Sore Pain Intervention(s): Limited activity within patient's tolerance, Monitored during session, Repositioned     Extremity/Trunk Assessment Upper Extremity Assessment Upper Extremity Assessment: Overall WFL for tasks assessed   Lower Extremity Assessment Lower Extremity Assessment: Defer to PT evaluation RLE Deficits / Details: limited due to pain, able to tolerate partial ROM of flexion, unable to reach full extension. limited activation of quad (likely pain) and reports dulled sensation of R toes RLE: Unable to fully assess due to pain RLE Sensation: decreased light touch;history of peripheral neuropathy   Cervical / Trunk Assessment Cervical / Trunk Assessment: Normal;Other exceptions Cervical / Trunk Exceptions: increased body habitus   Communication Communication Communication: No apparent difficulties   Cognition Arousal: Alert Behavior During Therapy: WFL for tasks assessed/performed Cognition: No apparent impairments                               Following commands: Intact       Cueing  General Comments   Cueing Techniques: Verbal cues  BP soft but stable, fluid bolus running. Spo2 stable on RA 95-97% throughout session   Exercises     Shoulder Instructions      Home Living Family/patient expects to be discharged to:: Private residence Living Arrangements: Alone Available Help at Discharge: Family;Available 24 hours/day Type of Home: Other(Comment) (town house) Home Access: Stairs to enter Secretary/administrator of Steps: 1 Entrance Stairs-Rails: None Home Layout: Two level;1/2 bath on main  level;Able to live on main level with bedroom/bathroom Alternate Level Stairs-Number of Steps: 1 flight Alternate Level Stairs-Rails: Left Bathroom Shower/Tub: Walk-in Human resources officer: Standard Bathroom Accessibility: No   Home Equipment: Agricultural consultant (2 wheels);BSC/3in1;Cane - single point;Rollator (4 wheels);Grab bars - tub/shower;Shower seat;Wheelchair - manual   Additional Comments: sister and friends can assist as needed      Prior Functioning/Environment Prior Level of Function : Independent/Modified Independent;History of Falls (last six months);Driving             Mobility Comments: using cane for mobility in commuinty, rollator in home ADLs Comments: independent ADLs, light IADLs, driving; grocery delivery    OT Problem List: Decreased strength;Decreased activity tolerance;Impaired balance (sitting and/or standing);Pain;Obesity;Decreased knowledge of precautions;Decreased knowledge of use of DME or AE   OT Treatment/Interventions: Self-care/ADL training;DME and/or AE instruction;Therapeutic activities;Balance training;Patient/family education;Therapeutic exercise      OT Goals(Current goals can be found in the care plan section)   Acute Rehab OT Goals Patient Stated Goal: get better OT Goal Formulation: With patient Time For Goal Achievement: 01/18/24 Potential to Achieve Goals: Good   OT Frequency:  Min 2X/week  Co-evaluation PT/OT/SLP Co-Evaluation/Treatment: Yes Reason for Co-Treatment: Complexity of the patient's impairments (multi-system involvement);Necessary to address cognition/behavior during functional activity;For patient/therapist safety;To address functional/ADL transfers PT goals addressed during session: Mobility/safety with mobility;Balance;Proper use of DME;Strengthening/ROM OT goals addressed during session: ADL's and self-care      AM-PAC OT "6 Clicks" Daily Activity     Outcome Measure Help from another person eating meals?:  None Help from another person taking care of personal grooming?: A Little Help from another person toileting, which includes using toliet, bedpan, or urinal?: A Lot Help from another person bathing (including washing, rinsing, drying)?: A Lot Help from another person to put on and taking off regular upper body clothing?: A Little Help from another person to put on and taking off regular lower body clothing?: A Lot 6 Click Score: 16   End of Session Equipment Utilized During Treatment: Gait belt;Rolling walker (2 wheels) Nurse Communication: Mobility status  Activity Tolerance: Patient tolerated treatment well Patient left: with call bell/phone within reach;in bed;with bed alarm set  OT Visit Diagnosis: Other abnormalities of gait and mobility (R26.89);Pain Pain - Right/Left: Right Pain - part of body: Leg;Knee                Time: 4034-7425 OT Time Calculation (min): 32 min Charges:  OT General Charges $OT Visit: 1 Visit OT Evaluation $OT Eval Moderate Complexity: 1 Mod  Bary Boss, OT Acute Rehabilitation Services Office [phone] Secure Chat Preferred    Fredrich Jefferson 01/04/2024, 12:35 PM

## 2024-01-05 DIAGNOSIS — E1169 Type 2 diabetes mellitus with other specified complication: Secondary | ICD-10-CM

## 2024-01-05 DIAGNOSIS — D62 Acute posthemorrhagic anemia: Secondary | ICD-10-CM | POA: Diagnosis not present

## 2024-01-05 DIAGNOSIS — D84821 Immunodeficiency due to drugs: Secondary | ICD-10-CM | POA: Diagnosis not present

## 2024-01-05 DIAGNOSIS — N179 Acute kidney failure, unspecified: Secondary | ICD-10-CM | POA: Diagnosis not present

## 2024-01-05 DIAGNOSIS — E66812 Obesity, class 2: Secondary | ICD-10-CM

## 2024-01-05 DIAGNOSIS — M9711XA Periprosthetic fracture around internal prosthetic right knee joint, initial encounter: Secondary | ICD-10-CM | POA: Diagnosis not present

## 2024-01-05 DIAGNOSIS — E785 Hyperlipidemia, unspecified: Secondary | ICD-10-CM

## 2024-01-05 LAB — CBC
HCT: 24.7 % — ABNORMAL LOW (ref 36.0–46.0)
Hemoglobin: 7.6 g/dL — ABNORMAL LOW (ref 12.0–15.0)
MCH: 29.6 pg (ref 26.0–34.0)
MCHC: 30.8 g/dL (ref 30.0–36.0)
MCV: 96.1 fL (ref 80.0–100.0)
Platelets: 161 10*3/uL (ref 150–400)
RBC: 2.57 MIL/uL — ABNORMAL LOW (ref 3.87–5.11)
RDW: 13.9 % (ref 11.5–15.5)
WBC: 7.6 10*3/uL (ref 4.0–10.5)
nRBC: 0 % (ref 0.0–0.2)

## 2024-01-05 LAB — GLUCOSE, CAPILLARY: Glucose-Capillary: 175 mg/dL — ABNORMAL HIGH (ref 70–99)

## 2024-01-05 LAB — HEMOGLOBIN AND HEMATOCRIT, BLOOD
HCT: 27.5 % — ABNORMAL LOW (ref 36.0–46.0)
Hemoglobin: 8.5 g/dL — ABNORMAL LOW (ref 12.0–15.0)

## 2024-01-05 MED ORDER — ALBUMIN HUMAN 25 % IV SOLN
25.0000 g | Freq: Once | INTRAVENOUS | Status: AC
  Administered 2024-01-05: 25 g via INTRAVENOUS
  Filled 2024-01-05: qty 100

## 2024-01-05 MED ORDER — INSULIN ASPART 100 UNIT/ML IJ SOLN
0.0000 [IU] | Freq: Three times a day (TID) | INTRAMUSCULAR | Status: DC
  Administered 2024-01-06 – 2024-01-10 (×6): 1 [IU] via SUBCUTANEOUS

## 2024-01-05 MED ORDER — OXYCODONE HCL 10 MG PO TABS: 5.0000 mg | ORAL_TABLET | ORAL | 0 refills | Status: DC | PRN

## 2024-01-05 MED ORDER — TIZANIDINE HCL 4 MG PO TABS: 4.0000 mg | ORAL_TABLET | Freq: Three times a day (TID) | ORAL | 0 refills | Status: AC

## 2024-01-05 MED ORDER — SODIUM CHLORIDE 0.9 % IV BOLUS
500.0000 mL | Freq: Once | INTRAVENOUS | Status: AC
  Administered 2024-01-05: 500 mL via INTRAVENOUS

## 2024-01-05 MED ORDER — APIXABAN 2.5 MG PO TABS
2.5000 mg | ORAL_TABLET | Freq: Two times a day (BID) | ORAL | 0 refills | Status: DC
Start: 2024-01-05 — End: 2024-04-03

## 2024-01-05 MED ORDER — MIDODRINE HCL 5 MG PO TABS
2.5000 mg | ORAL_TABLET | Freq: Three times a day (TID) | ORAL | Status: DC
  Administered 2024-01-06 – 2024-01-07 (×5): 2.5 mg via ORAL
  Filled 2024-01-05 (×5): qty 1

## 2024-01-05 MED ORDER — INSULIN ASPART 100 UNIT/ML IJ SOLN: 0.0000 [IU] | Freq: Every day | INTRAMUSCULAR | Status: DC

## 2024-01-05 MED ORDER — ACETAMINOPHEN 500 MG PO TABS: 1000.0000 mg | ORAL_TABLET | Freq: Three times a day (TID) | ORAL | Status: DC | PRN

## 2024-01-05 NOTE — Discharge Instructions (Signed)
 [facility] Discharge Instructions   General Discharge Instructions  WEIGHT BEARING STATUS:Weightbearing as tolerated  RANGE OF MOTION/ACTIVITY: Ok for knee and hip motion as tolerated  Wound Care: You may remove your surgical dressing on post op day 3 (Thursday 01/06/24). Incisions can be left open to air if there is no drainage. Once the incision is completely dry and without drainage, it may be left open to air out.  Showering may begin post op day (Friday 01/07/24).  Clean incision gently with soap and water .  DVT/PE prophylaxis: Eliquis 2.5 mg twice daily x 30 days  Diet: as you were eating previously.  Can use over the counter stool softeners and bowel preparations, such as Miralax , to help with bowel movements.  Narcotics can be constipating.  Be sure to drink plenty of fluids  PAIN MEDICATION USE AND EXPECTATIONS  You have likely been given narcotic medications to help control your pain.  After a traumatic event that results in an fracture (broken bone) with or without surgery, it is ok to use narcotic pain medications to help control one's pain.  We understand that everyone responds to pain differently and each individual patient will be evaluated on a regular basis for the continued need for narcotic medications. Ideally, narcotic medication use should last no more than 6-8 weeks (coinciding with fracture healing).   As a patient it is your responsibility as well to monitor narcotic medication use and report the amount and frequency you use these medications when you come to your office visit.   We would also advise that if you are using narcotic medications, you should take a dose prior to therapy to maximize you participation.  IF YOU ARE ON NARCOTIC MEDICATIONS IT IS NOT PERMISSIBLE TO OPERATE A MOTOR VEHICLE (MOTORCYCLE/CAR/TRUCK/MOPED) OR HEAVY MACHINERY DO NOT MIX NARCOTICS WITH OTHER CNS (CENTRAL NERVOUS SYSTEM) DEPRESSANTS SUCH AS ALCOHOL  POST-OPERATIVE  OPIOID TAPER INSTRUCTIONS: It is important to wean off of your opioid medication as soon as possible. If you do not need pain medication after your surgery it is ok to stop day one. Opioids include: Codeine, Hydrocodone (Norco, Vicodin), Oxycodone (Percocet, oxycontin ) and hydromorphone  amongst others.  Long term and even short term use of opiods can cause: Increased pain response Dependence Constipation Depression Respiratory depression And more.  Withdrawal symptoms can include Flu like symptoms Nausea, vomiting And more Techniques to manage these symptoms Hydrate well Eat regular healthy meals Stay active Use relaxation techniques(deep breathing, meditating, yoga) Do Not substitute Alcohol to help with tapering If you have been on opioids for less than two weeks and do not have pain than it is ok to stop all together.  Plan to wean off of opioids This plan should start within one week post op of your fracture surgery  Maintain the same interval or time between taking each dose and first decrease the dose.  Cut the total daily intake of opioids by one tablet each day Next start to increase the time between doses. The last dose that should be eliminated is the evening dose.    STOP SMOKING OR USING NICOTINE PRODUCTS!!!!  As discussed nicotine severely impairs your body's ability to heal surgical and traumatic wounds but also impairs bone healing.  Wounds and bone heal by forming microscopic blood vessels (angiogenesis) and nicotine is a vasoconstrictor (essentially, shrinks blood vessels).  Therefore, if vasoconstriction occurs to these microscopic blood vessels they essentially disappear and are unable to deliver necessary nutrients to the healing tissue.  This is  one modifiable factor that you can do to dramatically increase your chances of healing your injury.  (This means no smoking, no nicotine gum, patches, etc)  DO NOT USE NONSTEROIDAL ANTI-INFLAMMATORY DRUGS (NSAID'S)  Using  products such as Advil (ibuprofen), Aleve (naproxen), Motrin (ibuprofen) for additional pain control during fracture healing can delay and/or prevent the healing response.  If you would like to take over the counter (OTC) medication, Tylenol  (acetaminophen ) is ok.  However, some narcotic medications that are given for pain control contain acetaminophen  as well. Therefore, you should not exceed more than 4000 mg of tylenol  in a day if you do not have liver disease.  Also note that there are may OTC medicines, such as cold medicines and allergy medicines that my contain tylenol  as well.  If you have any questions about medications and/or interactions please ask your doctor/PA or your pharmacist.      ICE AND ELEVATE INJURED/OPERATIVE EXTREMITY  Using ice and elevating the injured extremity above your heart can help with swelling and pain control.  Icing in a pulsatile fashion, such as 20 minutes on and 20 minutes off, can be followed.    Do not place ice directly on skin. Make sure there is a barrier between to skin and the ice pack.    Using frozen items such as frozen peas works well as the conform nicely to the are that needs to be iced.  USE AN ACE WRAP OR TED HOSE FOR SWELLING CONTROL  In addition to icing and elevation, Ace wraps or TED hose are used to help limit and resolve swelling.  It is recommended to use Ace wraps or TED hose until you are informed to stop.    When using Ace Wraps start the wrapping distally (farthest away from the body) and wrap proximally (closer to the body)   Example: If you had surgery on your leg or thing and you do not have a splint on, start the ace wrap at the toes and work your way up to the thigh        If you had surgery on your upper extremity and do not have a splint on, start the ace wrap at your fingers and work your way up to the upper arm   CALL THE OFFICE FOR MEDICATION REFILLS OR WITH ANY QUESTIONS/CONCERNS: [phone]   VISIT OUR WEBSITE FOR  ADDITIONAL INFORMATION: [email or web address]   Discharge Wound Care Instructions  Do NOT apply any ointments, solutions or lotions to pin sites or surgical wounds.  These prevent needed drainage and even though solutions like hydrogen peroxide kill bacteria, they also damage cells lining the pin sites that help fight infection.  Applying lotions or ointments can keep the wounds moist and can cause them to breakdown and open up as well. This can increase the risk for infection. When in doubt call the office.  Surgical incisions should be dressed daily.  If any drainage is noted, use one layer of adaptic or Mepitel, then gauze, Kerlix, and an ace wrap. - These dressing supplies should be available at local medical supply stores ([facility], [facility], etc) as well as Insurance claims handler ([facility], [facility], [place], etc)  Once the incision is completely dry and without drainage, it may be left open to air out.  Showering may begin 36-48 hours later.  Cleaning gently with soap and water .  Traumatic wounds should be dressed daily as well.    One layer of adaptic, gauze, Kerlix, then ace wrap.  The  adaptic can be discontinued once the draining has ceased    If you have a wet to dry dressing: wet the gauze with saline the squeeze as much saline out so the gauze is moist (not soaking wet), place moistened gauze over wound, then place a dry gauze over the moist one, followed by Kerlix wrap, then ace wrap.    Call office for the following: Temperature greater than 101F Persistent nausea and vomiting Severe uncontrolled pain Redness, tenderness, or signs of infection (pain, swelling, redness, odor or green/yellow discharge around the site) Difficulty breathing, headache or visual disturbances Hives Persistent dizziness or light-headedness Extreme fatigue Any other questions or concerns you may have after discharge  In an emergency, call 911 or go to an Emergency Department at a nearby  hospital  OTHER HELPFUL INFORMATION  If you had a block, it will wear off between 8-24 hrs postop typically.  This is period when your pain may go from nearly zero to the pain you would have had postop without the block.  This is an abrupt transition but nothing dangerous is happening.  You may take an extra dose of narcotic when this happens.  You should wean off your narcotic medicines as soon as you are able.  Most patients will be off or using minimal narcotics before their first postop appointment.   We suggest you use the pain medication the first night prior to going to bed, in order to ease any pain when the anesthesia wears off. You should avoid taking pain medications on an empty stomach as it will make you nauseous.  Do not drink alcoholic beverages or take illicit drugs when taking pain medications.  In most states it is against the law to drive while you are in a splint or sling.  And certainly against the law to drive while taking narcotics.  You may return to work/school in the next couple of days when you feel up to it.   Pain medication may make you constipated.  Below are a few solutions to try in this order: Decrease the amount of pain medication if you aren't having pain. Drink lots of decaffeinated fluids. Drink prune juice and/or each dried prunes  If the first 3 don't work start with additional solutions Take Colace - an over-the-counter stool softener Take Senokot - an over-the-counter laxative Take Miralax  - a stronger over-the-counter laxative

## 2024-01-05 NOTE — Progress Notes (Signed)
 Physical Therapy Treatment Patient Details Name: Valerie [name] MRN: [id number] DOB: [birth year] Today's Date: 01/05/2024   History of Present Illness The pt is a 63 yo female presenting 5/11 after a fall in which she landed on her R knee. Pt s/p R TKA on 10/19/23, and found to have periprosthetic distal femur fx. Pt now s/p ORIF R distal femur fx on 5/12. PMH includes: anemia, bipolar disorder, CKD III, dementia, depression, DM II, neuropathy, NSTEMI, OSA, R TKA, bilateral THA, and R foot surgery.    PT Comments  Pt tolerated mobility progressions well but continues to be limited secondary to orthostatic hypotension. Pt was able to complete multiple STS and stand EOB with RW and minimal assistance. Multiple rest breaks were taken for blood pressure management. Pt was returned to bed at end of session with BP remaining low; RN notified. Pt was educated on bed level exercises appropriate for strengthening lower extremities while not with therapy (quad sets, glute sets, heel slides, ankle pumps). Pt voiced concern of losing extension in R knee following R TKA but was encouraged to place pillow under distal calf to encourage R knee extension while in bed. Pt would benefit from further transfer training and ambulation. PT will continue to treat patient while admitted to address remaining mobility deficits and optimize return to prior level of function. Patient will benefit from continued inpatient follow up therapy, <3 hours/day.       If plan is discharge home, recommend the following: A lot of help with walking and/or transfers;A lot of help with bathing/dressing/bathroom;Assist for transportation;Help with stairs or ramp for entrance   Can travel by private vehicle        Equipment Recommendations  None recommended by PT    Recommendations for Other Services Rehab consult     Precautions / Restrictions Precautions Precautions: Fall Recall of Precautions/Restrictions:  Intact Precaution/Restrictions Comments: pt recalled previous precaution of not placing pillow under knee. Restrictions Weight Bearing Restrictions Per Provider Order: Yes RLE Weight Bearing Per Provider Order: Weight bearing as tolerated     Mobility  Bed Mobility Overal bed mobility: Needs Assistance Bed Mobility: Supine to Sit, Sit to Supine     Supine to sit: Mod assist, HOB elevated, Used rails (Mod A for trunk management) Sit to supine: Min assist, HOB elevated, Used rails (min A for management of RLE)        Transfers Overall transfer level: Needs assistance Equipment used: Rolling walker (2 wheels) Transfers: Sit to/from Stand Sit to Stand: Min assist           General transfer comment: Pt completed 4 STS progressing from mod A -minA. Pt was given VC for sequencing and positioning RLE in slight knee extension and flexing L knee to increase WB through LLE for transfer. Visual cue given for brinring trunk anterior to knees.    Ambulation/Gait                   Stairs             Wheelchair Mobility     Tilt Bed    Modified Rankin (Stroke Patients Only)       Balance Overall balance assessment: Needs assistance Sitting-balance support: Feet supported, No upper extremity supported Sitting balance-Leahy Scale: Fair Sitting balance - Comments: Pt was able to sit EOB with CGA-supervision and no upper extremity support for several minutes during vitals management.   Standing balance support: Bilateral upper extremity supported, During functional activity, Reliant on assistive  device for balance Standing balance-Leahy Scale: Poor Standing balance comment: Pt relies heavily on RW for maintaining upright position                            Communication Communication Communication: No apparent difficulties  Cognition Arousal: Alert Behavior During Therapy: WFL for tasks assessed/performed   PT - Cognitive impairments: No apparent  impairments                         Following commands: Intact      Cueing Cueing Techniques: Verbal cues  Exercises General Exercises - Lower Extremity Hip Flexion/Marching: 10 reps    General Comments General comments (skin integrity, edema, etc.): BP was monitored throughout session. At beginning of session while patient was supine BP:100/36 > sitting EOB:77/37 > after completion of 2 STS patient complains of lightheadedness with seated BP: 56/47 (HR 197) > following significant time to rest and completion of 1 STS and standing marches BP: 60/38 > end of session supine in bed BP: 80/35; RN aware. Other than the one instance of symptompatic orthostatic hypotension, patient was asymptomatic throughout the session.      Pertinent Vitals/Pain Pain Assessment Pain Assessment: No/denies pain (pt was given pain medication prior to session starting; no complaints of pain throughout) Pain Intervention(s): RN gave pain meds during session, Monitored during session    Home Living                          Prior Function            PT Goals (current goals can now be found in the care plan section) Acute Rehab PT Goals Patient Stated Goal: progress to being able to go home with follow up therapy at home PT Goal Formulation: With patient/family Time For Goal Achievement: 01/18/24 Potential to Achieve Goals: Good Progress towards PT goals: Progressing toward goals    Frequency    Min 2X/week      PT Plan      Co-evaluation              AM-PAC PT "6 Clicks" Mobility   Outcome Measure  Help needed turning from your back to your side while in a flat bed without using bedrails?: A Little Help needed moving from lying on your back to sitting on the side of a flat bed without using bedrails?: A Lot Help needed moving to and from a bed to a chair (including a wheelchair)?: A Lot Help needed standing up from a chair using your arms (e.g., wheelchair or  bedside chair)?: A Little Help needed to walk in hospital room?: A Lot Help needed climbing 3-5 steps with a railing? : Total 6 Click Score: 13    End of Session Equipment Utilized During Treatment: Gait belt Activity Tolerance: Treatment limited secondary to medical complications (Comment) (orthostatic hypotension) Patient left: in bed;with call bell/phone within reach;with bed alarm set Nurse Communication: Mobility status;Other (comment) (orthostatic hypotension) PT Visit Diagnosis: Unsteadiness on feet (R26.81);Other abnormalities of gait and mobility (R26.89);Muscle weakness (generalized) (M62.81);History of falling (Z91.81);Pain     Time: 1610-9604 PT Time Calculation (min) (ACUTE ONLY): 44 min  Charges:    $Therapeutic Activity: 38-52 mins PT General Charges $$ ACUTE PT VISIT: 1 Visit                     [facility],  [facility] Acute Rehab [phone]    Lonell Rives 01/05/2024, 4:48 PM

## 2024-01-05 NOTE — Progress Notes (Signed)
 PROGRESS NOTE    Valerie [name]  ZOX:[id number] DOB: 1960-10-17 DOA: 01/02/2024 PCP: Vevelyn Gowers, NP   Brief Narrative:  Valerie [name] is a 63 y.o. female with medical history significant for anxiety/depression, NSTEMI in 2021, diabetes, CKD stage IIIB, peripheral neuropathy, bipolar disorder, hypothyroidism, psoriatic arthritis, CVA, dementia, OSA and a recent right total knee replacement in February 2025 by Dr. Agatha Horsfall who presented to the ER for evaluation of right leg pain after a fall.  Patient reports she was out in downtown eating for Mother's Day. Afterwards, a friend was picking her up and as she was walking, she tripped over a curb, fell and landed directly on her right knee.  Started having severe right leg pain and noticed deformity of the proximal part of her right knee.    She was unable to bear weight so some bystanders picked her up and her friend drove her to the ED.  She denies any dizziness, headaches, vision changes, fevers, chills, chest pain, syncope or leg swelling.  Reports a history of falls with her last fall in February 2025 prior to her knee replacement.  She is also scheduled for back surgery on May/29.   **Interim History Found have a periprosthetic fracture around her internal prosthetic right knee joint and orthopedic surgery was consulted and took her for an ORIF on 512.  Postoperatively she has an acute anemia drop and has been hypotensive.  Will continue to monitor her blood count and blood pressure and we have not added midodrine and albumin.   Assessment and Plan:  Periprosthetic fracture around internal prosthetic right knee joint: S/p ORIF 5/12. WBAT as tolerated. Ortho wants DOAC at discharge and now on Apixaban. Will defer to ortho which DOAC they want. Continue prn IV dilaudid , prn oxycodone  for pain. Hopefully home in next 1-2 days.  Lives at home alone in [place]. She had friend stay with her when she had right TKA last year. Pt plans on returning home  and having same friend stay with her at home but SNF is a better option and agreeable.  Acute postoperative anemia due to expected blood loss: Hgb/Hct Trend: Recent Labs  Lab 01/02/24 1419 01/03/24 0441 01/03/24 1840 01/04/24 0715 01/05/24 0700  HGB 11.3* 10.3* 8.3* 8.4* 7.6*  HCT 37.8 32.8* 26.5* 27.1* 24.7*  MCV 99.5 97.0 96.7 96.8 96.1  -Check Anemia Panel in the AM. CTM for S/Sx of Bleeding; No overt bleeding noted. Repeat CBC in the AM   Acute kidney injury superimposed on stage 3b chronic kidney disease (HCC): BUN/Cr Trend: Recent Labs  Lab 01/02/24 1419 01/03/24 0441 01/04/24 0715  BUN 26* 24* 18  CREATININE 1.68* 1.50* 1.43*  -C/w IVF with NS  -Avoid Nephrotoxic Medications, Contrast Dyes, Hypotension and Dehydration to Ensure Adequate Renal Perfusion and will need to Renally Adjust Meds. CTM and Trend Renal Function carefully and repeat CMP in the AM   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 On chronic prednisone  5 mg daily. Pt did received 10 mg IV decadron  with surgery yesterday. If BP remains borderline and pt does not need PRBC transfusion, will consider stress dose steroids and now on Prednisone  20 mg po Daily  Bipolar Disorder/Depression/Anxiety: C/w Fluoxetine  80 mg po daily, Lamotrigine  150 mg po BID, Levitiracetam 750 mg po BID, and Trazodone  200 mg po qHS  Parkinson's Disease w/o dyskinesia/Neuromuscular Disorder: C/w Carbidopa -Levodopa  25-100 mg 1 tab po BID and Primidone  100 mg po BID  Alzheimer's Dementia: C/w Donepezil  10 mg po at bedtime  and Memantine  10 mg po BID   Fall with Right Periprosthetic Fx: S/p ORIF; PT/OT recommending CIR but not a candidate so now agreeable to SNF   Hyperlipidemia associated with T2DM (HCC): C/w Rosuvastatin  10 mg po qHS   Essential HTN -> Hypotension: C/w Hydralazine  10 mg po q6hPRN SBP >170. Hold Antihypertensives. Given IVF Bolus and C/w IVF with NS @ 75 mL/hr. Give Albumin 25 g x1. Ordered Midodrine  2.5 mg po TIDwm. CTM BP per Protocol. Last BP reading was on the softer side at 92/44   DM type 2 (diabetes mellitus, type 2) complicated by Neuropathy Valerie [name], Valerie [name]): Resume Sensitive Novolog  SSI AC. C/w Gabapentin  300 mg po qAM and 600 mg qHS  Class II Obesity: Complicates overall prognosis and care. Estimated body mass index is 36.58 kg/m as calculated from the following:   Height as of this encounter: 5\' 2"  (1.575 m).   Weight as of this encounter: 90.7 kg. Weight Loss and Dietary Counseling given  DVT prophylaxis: enoxaparin  (LOVENOX ) injection 40 mg Start: 01/04/24 0800 SCDs Start: 01/03/24 0949 SCDs Start: 01/02/24 1628    Code Status: Full Code Family Communication: No family present @ bedside  Disposition Plan:  Level of care: Med-Surg Status is: Inpatient Remains inpatient appropriate because: Needs SNF and Medical Stability   Consultants:  Orthopedic Surgery  Procedures:  As delineated as above  Antimicrobials:  Anti-infectives (From admission, onward)    Start     Dose/Rate Route Frequency Ordered Stop   01/03/24 1600  ceFAZolin  (ANCEF ) IVPB 2g/100 mL premix        2 g 200 mL/hr over 30 Minutes Intravenous Every 8 hours 01/03/24 0949 01/04/24 0854   01/03/24 0815  vancomycin (VANCOCIN) powder  Status:  Discontinued          As needed 01/03/24 0815 01/03/24 0859   01/03/24 0600  ceFAZolin  (ANCEF ) IVPB 2g/100 mL premix        2 g 200 mL/hr over 30 Minutes Intravenous On call to O.R. 01/02/24 1829 01/03/24 0752   01/03/24 0000  fluconazole  (DIFLUCAN ) tablet 150 mg  Status:  Discontinued        150 mg Oral Every 3 DAYS 01/02/24 1718 01/02/24 1730       Subjective: Seen and examined at bedside and was doing okay today.  No nausea or vomiting.  Blood pressure is on the softer side.  Was dizzy a little bit with therapy.  No other concerns or complaints at this time.  Objective: Vitals:   01/05/24 0612 01/05/24 0742 01/05/24 1459 01/05/24 1737  BP: (!) 109/53 (!) 101/47  (!) 90/48 (!) 92/44  Pulse: (!) 47 (!) 49 (!) 51 (!) 51  Resp: 17 16 16    Temp: 97.6 F (36.4 C) 97.6 F (36.4 C) 97.6 F (36.4 C)   TempSrc: Oral Oral Axillary   SpO2: 100% 96% 96%   Weight:      Height:        Intake/Output Summary (Last 24 hours) at 01/05/2024 1923 Last data filed at 01/05/2024 1623 Gross per 24 hour  Intake 1300.84 ml  Output 1000 ml  Net 300.84 ml   Filed Weights   01/02/24 1331 01/03/24 0659  Weight: 95.3 kg 90.7 kg   Examination: Physical Exam:  Constitutional: WN/WD obese Caucasian female no acute distress Respiratory: Diminished to auscultation bilaterally, no wheezing, rales, rhonchi or crackles. Normal respiratory effort and patient is not tachypenic. No accessory muscle use.  Unlabored breathing Cardiovascular: RRR, no murmurs / rubs /  gallops. S1 and S2 auscultated.  Abdomen: Soft, non-tender, distended secondary to body habitus. Bowel sounds positive.  GU: Deferred. Musculoskeletal: No clubbing / cyanosis of digits/nails. No joint deformity upper and lower extremities.  Right leg is wrapped Skin: No rashes, lesions, ulcers on limited skin evaluation Neurologic: CN 2-12 grossly intact with no focal deficits. Romberg sign cerebellar reflexes not assessed.  Psychiatric: She is awake and alert and has a pleasant mood and affect.  Data Reviewed: I have personally reviewed following labs and imaging studies  CBC: Recent Labs  Lab 01/02/24 1419 01/03/24 0441 01/03/24 1840 01/04/24 0715 01/05/24 0700  WBC 9.3 7.1 9.1 6.7 7.6  NEUTROABS 7.4  --   --   --   --   HGB 11.3* 10.3* 8.3* 8.4* 7.6*  HCT 37.8 32.8* 26.5* 27.1* 24.7*  MCV 99.5 97.0 96.7 96.8 96.1  PLT 237 190 170 175 161   Basic Metabolic Panel: Recent Labs  Lab 01/02/24 1419 01/03/24 0441 01/04/24 0715  NA 139 139 140  K 4.0 4.8 4.1  CL 104 104 106  CO2 19* 26 27  GLUCOSE 154* 108* 101*  BUN 26* 24* 18  CREATININE 1.68* 1.50* 1.43*  CALCIUM  8.6* 7.8* 7.9*    GFR: Estimated Creatinine Clearance: 41.6 mL/min (A) (by C-G formula based on SCr of 1.43 mg/dL (H)). Liver Function Tests: Recent Labs  Lab 01/02/24 1419  AST 23  ALT 6  ALKPHOS 91  BILITOT 0.5  PROT 6.5  ALBUMIN 3.6   No results for input(s): "LIPASE", "AMYLASE" in the last 168 hours. No results for input(s): "AMMONIA" in the last 168 hours. Coagulation Profile: No results for input(s): "INR", "PROTIME" in the last 168 hours. Cardiac Enzymes: No results for input(s): "CKTOTAL", "CKMB", "CKMBINDEX", "TROPONINI" in the last 168 hours. BNP (last 3 results) No results for input(s): "PROBNP" in the last 8760 hours. HbA1C: No results for input(s): "HGBA1C" in the last 72 hours. CBG: Recent Labs  Lab 01/02/24 2104 01/03/24 0704 01/03/24 0903 01/03/24 1148  GLUCAP 104* 105* 122* 155*   Lipid Profile: No results for input(s): "CHOL", "HDL", "LDLCALC", "TRIG", "CHOLHDL", "LDLDIRECT" in the last 72 hours. Thyroid  Function Tests: No results for input(s): "TSH", "T4TOTAL", "FREET4", "T3FREE", "THYROIDAB" in the last 72 hours. Anemia Panel: No results for input(s): "VITAMINB12", "FOLATE", "FERRITIN", "TIBC", "IRON ", "RETICCTPCT" in the last 72 hours. Sepsis Labs: No results for input(s): "PROCALCITON", "LATICACIDVEN" in the last 168 hours.  Recent Results (from the past 240 hours)  MRSA Next Gen by PCR, Nasal     Status: None   Collection Time: 01/02/24  9:37 PM   Specimen: Nasal Mucosa; Nasal Swab  Result Value Ref Range Status   MRSA by PCR Next Gen NOT DETECTED NOT DETECTED Final    Comment: (NOTE) The GeneXpert MRSA Assay (FDA approved for NASAL specimens only), is one component of a comprehensive MRSA colonization surveillance program. It is not intended to diagnose MRSA infection nor to guide or monitor treatment for MRSA infections. Test performance is not FDA approved in patients less than 54 years old. Performed at [name], [address]  [place], [place] [address]     Radiology Studies: No results found.  Scheduled Meds:  acetaminophen   1,000 mg Oral TID   carbidopa -levodopa   1 tablet Oral BID   docusate sodium   100 mg Oral BID   donepezil   10 mg Oral Daily   enoxaparin  (LOVENOX ) injection  40 mg Subcutaneous Q24H   FLUoxetine   80  mg Oral Daily   gabapentin   300 mg Oral q morning   And   gabapentin   600 mg Oral QHS   insulin  aspart  0-5 Units Subcutaneous QHS   [START ON 01/06/2024] insulin  aspart  0-9 Units Subcutaneous TID WC   lamoTRIgine   150 mg Oral BID   levETIRAcetam   750 mg Oral BID   memantine   10 mg Oral BID   [START ON 01/06/2024] midodrine  2.5 mg Oral TID WC   multivitamin with minerals  1 tablet Oral QHS   omega-3 acid ethyl esters  1 g Oral Daily   oxybutynin   5 mg Oral BID   pantoprazole   40 mg Oral Daily   predniSONE   20 mg Oral Q breakfast   primidone   100 mg Oral BID   rosuvastatin   10 mg Oral QHS   senna-docusate  2 tablet Oral Daily   tiZANidine  4 mg Oral TID   traZODone   200 mg Oral QHS   Continuous Infusions:  sodium chloride  75 mL/hr at 01/05/24 1623    LOS: 3 days   Aura Leeds, DO [facility] Available via Epic secure chat 7am-7pm After these hours, please refer to coverage provider listed on [email or web address] 01/05/2024, 7:23 PM

## 2024-01-05 NOTE — TOC Progression Note (Signed)
 Transition of Care [facility]) - Progression Note    Patient Details  Name: Valerie [name] MRN: [id number] Date of Birth: 07/06/1961  [facility]) CM/SW Contact  Elspeth Hals, LCSW Phone Number: 01/05/2024, 8:29 AM  Clinical Narrative:   Email from [facility]: they are not able to offer bed.      Expected Discharge Plan: Skilled Nursing Facility Barriers to Discharge: Continued Medical Work up, SNF Pending bed offer  Expected Discharge Plan and Services In-house Referral: Clinical Social Work   Post Acute Care Choice: Skilled Nursing Facility Living arrangements for the past 2 months: Single Family Home                                       Social Determinants of Health (SDOH) Interventions SDOH Screenings   Food Insecurity: No Food Insecurity (01/02/2024)  Housing: Low Risk  (01/02/2024)  Transportation Needs: No Transportation Needs (01/02/2024)  Utilities: Not At Risk (01/02/2024)  Alcohol Screen: Low Risk  (12/04/2022)  Depression (PHQ2-9): Medium Risk (09/21/2023)  Financial Resource Strain: High Risk (01/11/2023)  Physical Activity: Inactive (09/21/2023)  Social Connections: Socially Isolated (10/21/2023)  Stress: Stress Concern Present (09/21/2023)  Tobacco Use: Medium Risk (01/03/2024)    Readmission Risk Interventions     No data to display

## 2024-01-05 NOTE — Progress Notes (Signed)
 Orthopaedic Trauma Progress Note  SUBJECTIVE: Patient resting in bed.  BP still remains slightly soft but better than yesterday.  Patient denies any significant numbness or tingling throughout the leg.  No chest pain. No SOB. No nausea/vomiting. No other complaints.  Was able to work with PT/OT yesterday, mobility limited by pain.   Patient's initial plan was to return home at discharge and have friend stay with her, but based on mobility I feel she would benefit from SNF.  She is agreeable to this.  OBJECTIVE:  Vitals:   01/05/24 0612 01/05/24 0742  BP: (!) 109/53 (!) 101/47  Pulse: (!) 47 (!) 49  Resp: 17 16  Temp: 97.6 F (36.4 C) 97.6 F (36.4 C)  SpO2: 100% 96%    Opiates Today (MME): Today's  total administered Morphine  Milligram Equivalents: 0 Opiates Yesterday (MME): Yesterday's total administered Morphine  Milligram Equivalents: 20  General: Resting in bed, no acute distress. Respiratory: No increased work of breathing.  Operative Extremity (right lower extremity): Dressing clean, dry, intact.  Endorses sensation of light touch over all aspects of the foot.+ DP pulse  IMAGING: Stable post op imaging.   LABS:  Results for orders placed or performed during the hospital encounter of 01/02/24 (from the past 24 hours)  CBC     Status: Abnormal   Collection Time: 01/05/24  7:00 AM  Result Value Ref Range   WBC 7.6 4.0 - 10.5 K/uL   RBC 2.57 (L) 3.87 - 5.11 MIL/uL   Hemoglobin 7.6 (L) 12.0 - 15.0 g/dL   HCT 16.1 (L) 09.6 - 04.5 %   MCV 96.1 80.0 - 100.0 fL   MCH 29.6 26.0 - 34.0 pg   MCHC 30.8 30.0 - 36.0 g/dL   RDW 40.9 81.1 - 91.4 %   Platelets 161 150 - 400 K/uL   nRBC 0.0 0.0 - 0.2 %    ASSESSMENT: Valerie [name] is a 63 y.o. female, 2 Days Post-Op s/p mechanical fall Procedures: OPEN REDUCTION INTERNAL FIXATION RIGHT PERIPROSTHETIC FRACTURE  CV/Blood loss: Acute blood loss anemia, Hgb 7.6 this AM.  BP remains soft   PLAN: Weightbearing: WBAT RLE ROM: Okay for  unrestricted hip and knee motion as tolerated Incisional and dressing care: Daily dressing changes as needed starting today Showering: Okay to begin getting incisions wet starting 01/06/2024 Orthopedic device(s): None  Pain management:  1. Tylenol  1000 mg 3 times daily  2. Tizanidine 4 mg TID 3. Oxycodone  5-10 mg q 4 hours PRN 4. Dilaudid  1 mg q 6 hours PRN 5. Neurontin  300 mg in AM., 600 mg QHS VTE prophylaxis: Lovenox , SCDs ID:  Ancef  2gm post op completed Foley/Lines:  No foley, KVO IVFs Impediments to Fracture Healing: Vitamin D  level 52, no supplementation needed Dispo: PT/OT evaluation ongoing, recommending SNF.  Continue to monitor blood pressure and work on pain control.  Okay for discharge from ortho standpoint once cleared by medicine team and therapies.  I have signed and placed discharge Rx for pain medication, DVT prophylaxis in patient's chart.  D/C recommendations: - Oxycodone , tizanidine, Tylenol  for pain control - Eliquis 2.5 mg twice daily x 30 days for DVT prophylaxis - No additional need for Vit D supplementation  Follow - up plan: 2 weeks after d/c for wound check and repeat x-rays   Contact information:  Katheryne Pane MD, Alona Jamaica PA-C. After hours and holidays please check [email or web address] for group call information for [facility]   Edilia Gordon, PA-C [phone] (office) [email or web address]

## 2024-01-05 NOTE — TOC Progression Note (Addendum)
 Transition of Care [facility]) - Progression Note    Patient Details  Name: Valerie [name] MRN: [id number] Date of Birth: 09-08-1960  Transition [facility]) CM/SW Contact  Katrinka Parr, [place] Phone Number: 01/05/2024, 12:51 PM  Clinical Narrative:     CSW met with pt and provided SNF bed offers. Informed her that [facility] cannot offer and waiting to hear back from [facility]. She is interested in hearing from [facility] prior to deciding alternative SNF.   1245: CSW met with pt and informed her [facility] cannot offer and a SNF choice would be needed from the list of facilities that can offer a bed. Pt states her friend is going to go look at two facilities in [place] today prior to making decision. TOC to follow for SNF choice.   Documents uploaded to [facility]; passr pending  1600: CSW spoke with pt on room phone. She states she does not have a choice at the moment. She is waiting for her daughter to get off work to also review facilities. She request CSW call her daughter.   CSW called and left voicemail with pt's daughter.    Expected Discharge Plan: Skilled Nursing Facility Barriers to Discharge: Continued Medical Work up, SNF Pending bed offer  Expected Discharge Plan and Services In-house Referral: Clinical Social Work   Post Acute Care Choice: Skilled Nursing Facility Living arrangements for the past 2 months: Single Family Home                                       Social Determinants of Health (SDOH) Interventions SDOH Screenings   Food Insecurity: No Food Insecurity (01/02/2024)  Housing: Low Risk  (01/02/2024)  Transportation Needs: No Transportation Needs (01/02/2024)  Utilities: Not At Risk (01/02/2024)  Alcohol Screen: Low Risk  (12/04/2022)  Depression (PHQ2-9): Medium Risk (09/21/2023)  Financial Resource Strain: High Risk (01/11/2023)  Physical Activity: Inactive (09/21/2023)  Social Connections: Socially Isolated (10/21/2023)  Stress:  Stress Concern Present (09/21/2023)  Tobacco Use: Medium Risk (01/03/2024)    Readmission Risk Interventions     No data to display

## 2024-01-05 NOTE — Plan of Care (Signed)
   Problem: Education: Goal: Ability to describe self-care measures that may prevent or decrease complications (Diabetes Survival Skills Education) will improve Outcome: Progressing   Problem: Education: Goal: Individualized Educational Video(s) Outcome: Progressing   Problem: Coping: Goal: Ability to adjust to condition or change in health will improve Outcome: Progressing

## 2024-01-05 NOTE — Care Management Important Message (Signed)
 Important Message  Patient Details  Name: Valerie [name] MRN: [id number] Date of Birth: February 29, 1961   Important Message Given:  Yes - Medicare IM     Felix Host 01/05/2024, 12:01 PM

## 2024-01-06 DIAGNOSIS — W19XXXA Unspecified fall, initial encounter: Secondary | ICD-10-CM | POA: Diagnosis not present

## 2024-01-06 DIAGNOSIS — M9711XA Periprosthetic fracture around internal prosthetic right knee joint, initial encounter: Secondary | ICD-10-CM | POA: Diagnosis not present

## 2024-01-06 DIAGNOSIS — D62 Acute posthemorrhagic anemia: Secondary | ICD-10-CM | POA: Diagnosis not present

## 2024-01-06 DIAGNOSIS — N179 Acute kidney failure, unspecified: Secondary | ICD-10-CM | POA: Diagnosis not present

## 2024-01-06 LAB — CBC WITH DIFFERENTIAL/PLATELET
Abs Immature Granulocytes: 0.03 10*3/uL (ref 0.00–0.07)
Basophils Absolute: 0 10*3/uL (ref 0.0–0.1)
Basophils Relative: 1 %
Eosinophils Absolute: 0.2 10*3/uL (ref 0.0–0.5)
Eosinophils Relative: 3 %
HCT: 23.4 % — ABNORMAL LOW (ref 36.0–46.0)
Hemoglobin: 7.2 g/dL — ABNORMAL LOW (ref 12.0–15.0)
Immature Granulocytes: 0 %
Lymphocytes Relative: 16 %
Lymphs Abs: 1.1 10*3/uL (ref 0.7–4.0)
MCH: 30 pg (ref 26.0–34.0)
MCHC: 30.8 g/dL (ref 30.0–36.0)
MCV: 97.5 fL (ref 80.0–100.0)
Monocytes Absolute: 0.6 10*3/uL (ref 0.1–1.0)
Monocytes Relative: 8 %
Neutro Abs: 4.8 10*3/uL (ref 1.7–7.7)
Neutrophils Relative %: 72 %
Platelets: 189 10*3/uL (ref 150–400)
RBC: 2.4 MIL/uL — ABNORMAL LOW (ref 3.87–5.11)
RDW: 14.3 % (ref 11.5–15.5)
WBC: 6.7 10*3/uL (ref 4.0–10.5)
nRBC: 0 % (ref 0.0–0.2)

## 2024-01-06 LAB — COMPREHENSIVE METABOLIC PANEL WITH GFR
ALT: <5 U/L (ref 0–44)
AST: 14 U/L — ABNORMAL LOW (ref 15–41)
Albumin: 2.8 g/dL — ABNORMAL LOW (ref 3.5–5.0)
Alkaline Phosphatase: 53 U/L (ref 38–126)
Anion gap: 6 (ref 5–15)
BUN: 19 mg/dL (ref 8–23)
CO2: 23 mmol/L (ref 22–32)
Calcium: 8.1 mg/dL — ABNORMAL LOW (ref 8.9–10.3)
Chloride: 108 mmol/L (ref 98–111)
Creatinine, Ser: 1.18 mg/dL — ABNORMAL HIGH (ref 0.44–1.00)
GFR, Estimated: 52 mL/min — ABNORMAL LOW (ref >60)
Glucose, Bld: 122 mg/dL — ABNORMAL HIGH (ref 70–99)
Potassium: 4.4 mmol/L (ref 3.5–5.1)
Sodium: 137 mmol/L (ref 135–145)
Total Bilirubin: 0.3 mg/dL (ref 0.0–1.2)
Total Protein: 5.1 g/dL — ABNORMAL LOW (ref 6.5–8.1)

## 2024-01-06 LAB — GLUCOSE, CAPILLARY
Glucose-Capillary: 95 mg/dL (ref 70–99)
Glucose-Capillary: 122 mg/dL — ABNORMAL HIGH (ref 70–99)
Glucose-Capillary: 134 mg/dL — ABNORMAL HIGH (ref 70–99)
Glucose-Capillary: 88 mg/dL (ref 70–99)

## 2024-01-06 LAB — MAGNESIUM: Magnesium: 1.9 mg/dL (ref 1.7–2.4)

## 2024-01-06 LAB — PHOSPHORUS: Phosphorus: 2.3 mg/dL — ABNORMAL LOW (ref 2.5–4.6)

## 2024-01-06 MED ORDER — POLYETHYLENE GLYCOL 3350 17 G PO PACK
17.0000 g | PACK | Freq: Two times a day (BID) | ORAL | Status: DC
  Administered 2024-01-06 – 2024-01-09 (×7): 17 g via ORAL
  Filled 2024-01-06 (×7): qty 1

## 2024-01-06 MED ORDER — ORAL CARE MOUTH RINSE: 15.0000 mL | OROMUCOSAL | Status: DC | PRN

## 2024-01-06 MED ORDER — K PHOS MONO-SOD PHOS DI & MONO 155-852-130 MG PO TABS
500.0000 mg | ORAL_TABLET | Freq: Once | ORAL | Status: AC
  Administered 2024-01-06: 500 mg via ORAL
  Filled 2024-01-06: qty 2

## 2024-01-06 MED ORDER — SENNOSIDES-DOCUSATE SODIUM 8.6-50 MG PO TABS
2.0000 | ORAL_TABLET | Freq: Two times a day (BID) | ORAL | Status: DC
  Administered 2024-01-06 – 2024-01-10 (×8): 2 via ORAL
  Filled 2024-01-06 (×8): qty 2

## 2024-01-06 MED ORDER — BISACODYL 10 MG RE SUPP
10.0000 mg | Freq: Every day | RECTAL | Status: DC | PRN
  Administered 2024-01-06: 10 mg via RECTAL
  Filled 2024-01-06: qty 1

## 2024-01-06 NOTE — Progress Notes (Signed)
 Physical Therapy Treatment Patient Details Name: Valerie [name] MRN: [id number] DOB: 03/07/1961 Today's Date: 01/06/2024   History of Present Illness The pt is a 63 yo female presenting 5/11 after a fall in which she landed on her R knee. Pt s/p R TKA on 10/19/23, and found to have periprosthetic distal femur fx. Pt now s/p ORIF R distal femur fx on 5/12. PMH includes: anemia, bipolar disorder, CKD III, dementia, depression, DM II, neuropathy, NSTEMI, OSA, R TKA, bilateral THA, and R foot surgery.    PT Comments  Pt received in supine and agreeable to session. Pt able to progress to short ambulation distances this session, however continues to be limited by RLE pain. Pt able to complete all mobility with CGA for safety. Mild R knee instability noted, but no LOB. Pt continues to benefit from PT services to progress toward functional mobility goals.     If plan is discharge home, recommend the following: A lot of help with walking and/or transfers;A lot of help with bathing/dressing/bathroom;Assist for transportation;Help with stairs or ramp for entrance   Can travel by private vehicle        Equipment Recommendations  None recommended by PT    Recommendations for Other Services       Precautions / Restrictions Precautions Precautions: Fall Recall of Precautions/Restrictions: Intact Restrictions Weight Bearing Restrictions Per Provider Order: Yes RLE Weight Bearing Per Provider Order: Weight bearing as tolerated     Mobility  Bed Mobility Overal bed mobility: Needs Assistance Bed Mobility: Supine to Sit       Sit to supine: Contact guard assist, HOB elevated, Used rails   General bed mobility comments: increased time and difficulty advancing RLE    Transfers Overall transfer level: Needs assistance Equipment used: Rolling walker (2 wheels) Transfers: Sit to/from Stand Sit to Stand: Contact guard assist           General transfer comment: From EOB and recliner with cues  for hand placement and CGA for safety.    Ambulation/Gait Ambulation/Gait assistance: Contact guard assist Gait Distance (Feet): 5 Feet (x2) Assistive device: Rolling walker (2 wheels) Gait Pattern/deviations: Step-to pattern, Antalgic, Decreased weight shift to right, Decreased stance time - right, Decreased step length - left       General Gait Details: Pt demonstrates step-to pattern with limited RLE WB tolerance. Mild R knee buckling noted requiring intermittent blocking, but no LOB. Heavy reliance on BUE support on RW   Stairs             Wheelchair Mobility     Tilt Bed    Modified Rankin (Stroke Patients Only)       Balance Overall balance assessment: Needs assistance Sitting-balance support: Feet supported, No upper extremity supported Sitting balance-Leahy Scale: Fair Sitting balance - Comments: sitting EOB   Standing balance support: Bilateral upper extremity supported, During functional activity, Reliant on assistive device for balance Standing balance-Leahy Scale: Poor Standing balance comment: with RW support                            Communication Communication Communication: No apparent difficulties  Cognition Arousal: Alert Behavior During Therapy: WFL for tasks assessed/performed   PT - Cognitive impairments: No apparent impairments                         Following commands: Intact      Cueing Cueing Techniques: Verbal cues  Exercises General Exercises - Lower Extremity Long Arc Quad: AROM, Seated, Right, 10 reps    General Comments        Pertinent Vitals/Pain Pain Assessment Pain Assessment: 0-10 Pain Score: 7  Pain Location: R knee and thigh Pain Descriptors / Indicators: Discomfort, Grimacing, Guarding, Sore Pain Intervention(s): Limited activity within patient's tolerance, Monitored during session, Repositioned     PT Goals (current goals can now be found in the care plan section) Acute Rehab PT  Goals Patient Stated Goal: progress to being able to go home with follow up therapy at home PT Goal Formulation: With patient/family Time For Goal Achievement: 01/18/24 Progress towards PT goals: Progressing toward goals    Frequency    Min 2X/week       AM-PAC PT "6 Clicks" Mobility   Outcome Measure  Help needed turning from your back to your side while in a flat bed without using bedrails?: A Little Help needed moving from lying on your back to sitting on the side of a flat bed without using bedrails?: A Little Help needed moving to and from a bed to a chair (including a wheelchair)?: A Little Help needed standing up from a chair using your arms (e.g., wheelchair or bedside chair)?: A Little Help needed to walk in hospital room?: A Lot Help needed climbing 3-5 steps with a railing? : Total 6 Click Score: 15    End of Session Equipment Utilized During Treatment: Gait belt Activity Tolerance: Patient tolerated treatment well Patient left: in chair;with call bell/phone within reach Nurse Communication: Mobility status;Patient requests pain meds PT Visit Diagnosis: Unsteadiness on feet (R26.81);Other abnormalities of gait and mobility (R26.89);Muscle weakness (generalized) (M62.81);History of falling (Z91.81);Pain     Time: 1478-2956 PT Time Calculation (min) (ACUTE ONLY): 23 min  Charges:    $Gait Training: 8-22 mins $Therapeutic Activity: 8-22 mins PT General Charges $$ ACUTE PT VISIT: 1 Visit                     Michaelle Adolphus, PTA Acute Rehabilitation Services Secure Chat Preferred  [phone]    Michaelle Adolphus 01/06/2024, 10:50 AM

## 2024-01-06 NOTE — Progress Notes (Signed)
   01/06/24 0008  BiPAP/CPAP/SIPAP  BiPAP/CPAP/SIPAP Pt Type Adult  BiPAP/CPAP/SIPAP Resmed  Mask Type Nasal mask  FiO2 (%) 21 %  Patient Home Machine Yes  Safety Check Completed by RT for Home Unit Yes, no issues noted  Patient Home Mask Yes  Patient Home Tubing Yes  BiPAP/CPAP /SiPAP Vitals  Bilateral Breath Sounds Diminished

## 2024-01-06 NOTE — Plan of Care (Signed)
  Problem: Tissue Perfusion: Goal: Adequacy of tissue perfusion will improve Outcome: Progressing   Problem: Education: Goal: Knowledge of General Education information will improve Description: Including pain rating scale, medication(s)/side effects and non-pharmacologic comfort measures Outcome: Progressing   Problem: Coping: Goal: Level of anxiety will decrease Outcome: Progressing   Problem: Elimination: Goal: Will not experience complications related to urinary retention Outcome: Progressing   Problem: Pain Managment: Goal: General experience of comfort will improve and/or be controlled Outcome: Progressing

## 2024-01-06 NOTE — Progress Notes (Signed)
 Disempacked patient per order. Patient passed a large amount of type 1 Valerie [name] stool.

## 2024-01-06 NOTE — TOC Progression Note (Addendum)
 Transition of Care [facility]) - Progression Note    Patient Details  Name: Valerie [name] MRN: [id number] Date of Birth: [birth year]  Transition of [facility]) CM/SW Contact  Katrinka Parr, [place] Phone Number: 01/06/2024, 9:31 AM  Clinical Narrative:     Spoke with pt's daughter over the phone and explained SNF bed offers. Pt has been to [facility] in the past and may be interested in that facility but [facility] is very close to one of pt's friends home. Pt's friend is supposed to go visit [facility] today. CSW explained a SNF choice would be needed today.   1230: [facility] liaison notified CSW that pt's friend visited and that they have one more bed for bed. [facility] is requesting confirmation if pt chooses them as it is their last bed.  1330: CSW met with pt to discuss SNF choice. Pt confirms choice for [facility]. CSW notified [facility] and submitted SNF auth request in online portal. Auth# [id number] SN[facility] is pending.    Expected Discharge Plan: Skilled Nursing Facility Barriers to Discharge: Continued Medical Work up, SNF Pending bed offer  Expected Discharge Plan and Services In-house Referral: Clinical Social Work   Post Acute Care Choice: Skilled Nursing Facility Living arrangements for the past 2 months: Single Family Home                                       Social Determinants of Health (SDOH) Interventions SDOH Screenings   Food Insecurity: No Food Insecurity (01/02/2024)  Housing: Low Risk  (01/02/2024)  Transportation Needs: No Transportation Needs (01/02/2024)  Utilities: Not At Risk (01/02/2024)  Alcohol Screen: Low Risk  (12/04/2022)  Depression (PHQ2-9): Medium Risk (09/21/2023)  Financial Resource Strain: High Risk (01/11/2023)  Physical Activity: Inactive (09/21/2023)  Social Connections: Socially Isolated (10/21/2023)  Stress: Stress Concern Present (09/21/2023)  Tobacco Use: Medium Risk (01/03/2024)    Readmission Risk Interventions     No data to  display

## 2024-01-06 NOTE — Progress Notes (Signed)
 PROGRESS NOTE    Valerie [name]  ZOX:[id number] DOB: March 26, 1961 DOA: 01/02/2024 PCP: Vevelyn Gowers, NP   Brief Narrative:  Valerie [name] is a 63 y.o. female with medical history significant for anxiety/depression, NSTEMI in 2021, diabetes, CKD stage IIIB, peripheral neuropathy, bipolar disorder, hypothyroidism, psoriatic arthritis, CVA, dementia, OSA and a recent right total knee replacement in February 2025 by Dr. Agatha Horsfall who presented to the ER for evaluation of right leg pain after a fall.  Patient reports she was out in downtown eating for Mother's Day. Afterwards, a friend was picking her up and as she was walking, she tripped over a curb, fell and landed directly on her right knee.  Started having severe right leg pain and noticed deformity of the proximal part of her right knee.    She was unable to bear weight so some bystanders picked her up and her friend drove her to the ED.  She denies any dizziness, headaches, vision changes, fevers, chills, chest pain, syncope or leg swelling.  Reports a history of falls with her last fall in February 2025 prior to her knee replacement.  She is also scheduled for back surgery on May/29.   **Interim History Found have a periprosthetic fracture around her internal prosthetic right knee joint and orthopedic surgery was consulted and took her for an ORIF on 5/12.  Postoperatively she has an acute anemia drop and has been hypotensive.  Will continue to monitor her blood count and blood pressure and we have now added midodrine and albumin.   Assessment and Plan:  Periprosthetic fracture around internal prosthetic right knee joint: S/p ORIF 5/12. WBAT as tolerated. Ortho wants DOAC at discharge w/ Apixaban and currently on Enoxaparin  40 mg sq daily while hospitalized. Continue prn IV dilaudid , prn oxycodone  for pain. PT/OT Recommending SNF and agreeable. [facility]fighter pending as patient has select [facility].   Acute postoperative anemia due to expected  blood loss: Hgb/Hct Trend: Recent Labs  Lab 01/02/24 1419 01/03/24 0441 01/03/24 1840 01/04/24 0715 01/05/24 0700 01/05/24 1856 01/06/24 0632  HGB 11.3* 10.3* 8.3* 8.4* 7.6* 8.5* 7.2*  HCT 37.8 32.8* 26.5* 27.1* 24.7* 27.5* 23.4*  MCV 99.5 97.0 96.7 96.8 96.1  --  97.5  -Check Anemia Panel in the AM. CTM for S/Sx of Bleeding; No overt bleeding noted. Repeat CBC in the AM   Acute kidney injury superimposed on stage 3b chronic kidney disease (HCC): BUN/Cr Trend: Recent Labs  Lab 01/02/24 1419 01/03/24 0441 01/04/24 0715 01/06/24 0632  BUN 26* 24* 18 19  CREATININE 1.68* 1.50* 1.43* 1.18*  -IVF with NS now stopped -Avoid Nephrotoxic Medications, Contrast Dyes, Hypotension and Dehydration to Ensure Adequate Renal Perfusion and will need to Renally Adjust Meds. CTM and Trend Renal Function carefully and repeat CMP in the AM   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 On chronic prednisone  5 mg daily. Pt did received 10 mg IV decadron  with surgery yesterday. If BP remains borderline and pt does not need PRBC transfusion, will consider stress dose steroids and now on Prednisone  20 mg po Daily  Constipation with Fecal Impaction: C/w Senna-Docusate 2 tab BID, Miralax  17 grams BID, Bisacodyl  Suppository. Had to be manually disimpacted by Nursing today  Bipolar Disorder/Depression/Anxiety: C/w Fluoxetine  80 mg po daily, Lamotrigine  150 mg po BID, Levitiracetam 750 mg po BID, and Trazodone  200 mg po qHS  Parkinson's Disease w/o dyskinesia/Neuromuscular Disorder: C/w Carbidopa -Levodopa  25-100 mg 1 tab po BID and Primidone  100 mg po BID  Hypophosphatemia: Phos  Level is now 2.3. Replete with po Kphos Neutral 500 mg x1. CTM and Replete as Necessary. Repeat Phos level in the AM  Alzheimer's Dementia: C/w Donepezil  10 mg po at bedtime and Memantine  10 mg po BID   Fall with Right Periprosthetic Fx: S/p ORIF; PT/OT recommending CIR but not a candidate so now agreeable to SNF    Hyperlipidemia associated with T2DM (HCC): C/w Rosuvastatin  10 mg po qHS   Essential HTN -> Hypotension: C/w Hydralazine  10 mg po q6hPRN SBP >170. Hold Antihypertensives. Given IVF Bolus yesterday mIVF with NS @ 75 mL/hr but this is now stopped. Given Albumin 25 g x1. Ordered Midodrine 2.5 mg po TIDwm. CTM BP per Protocol. Last BP reading was on the softer side at 95/44   DM Type 2 (diabetes mellitus, type 2) complicated by Neuropathy [facility]): Resume Sensitive Novolog  SSI AC. C/w Gabapentin  300 mg po qAM and 600 mg at bedtime. CBGs ranging from 95-175  Class II Obesity: Complicates overall prognosis and care. Estimated body mass index is 36.58 kg/m as calculated from the following:   Height as of this encounter: 5\' 2"  (1.575 m).   Weight as of this encounter: 90.7 kg. Weight Loss and Dietary Counseling given   DVT prophylaxis: enoxaparin  (LOVENOX ) injection 40 mg Start: 01/04/24 0800 SCDs Start: 01/03/24 0949 SCDs Start: 01/02/24 1628    Code Status: Full Code Family Communication: No family present @ bedside  Disposition Plan:  Level of care: Med-Surg Status is: Inpatient Remains inpatient appropriate because: Needs further clinical improvement, has to have a bowel movement and is sure stability of her hemoglobin prior to discharging   Consultants:  Orthopedic Surgery  Procedures:  As delineated as above  Antimicrobials:  Anti-infectives (From admission, onward)    Start     Dose/Rate Route Frequency Ordered Stop   01/03/24 1600  ceFAZolin  (ANCEF ) IVPB 2g/100 mL premix        2 g 200 mL/hr over 30 Minutes Intravenous Every 8 hours 01/03/24 0949 01/04/24 0854   01/03/24 0815  vancomycin (VANCOCIN) powder  Status:  Discontinued          As needed 01/03/24 0815 01/03/24 0859   01/03/24 0600  ceFAZolin  (ANCEF ) IVPB 2g/100 mL premix        2 g 200 mL/hr over 30 Minutes Intravenous On call to O.R. 01/02/24 1829 01/03/24 0752   01/03/24 0000  fluconazole  (DIFLUCAN ) tablet 150 mg   Status:  Discontinued        150 mg Oral Every 3 DAYS 01/02/24 1718 01/02/24 1730       Subjective: Seen and examined at bedside and states her abdomen was hurting a little bit today given that she has not had a bowel movement.  Also complains of leg spasms given that she has been up and ambulating.  No nausea or vomiting.  Blood pressure continues to be on the softer side.  No lightheadedness or dizziness today.  Wants to have a bowel movement and states that she has been unable to.  No other concerns or complaints at this time.  Objective: Vitals:   01/06/24 0427 01/06/24 0832 01/06/24 1122 01/06/24 1524  BP: (!) 103/31 (!) 108/57 102/60 (!) 95/44  Pulse: (!) 51 (!) 52  (!) 57  Resp: 18 14  16   Temp: 97.9 F (36.6 C) 98.2 F (36.8 C)  97.8 F (36.6 C)  TempSrc: Oral Oral  Oral  SpO2: 97% 98%  94%  Weight:      Height:  Intake/Output Summary (Last 24 hours) at 01/06/2024 1755 Last data filed at 01/06/2024 1700 Gross per 24 hour  Intake 960 ml  Output 1500 ml  Net -540 ml   Filed Weights   01/02/24 1331 01/03/24 0659  Weight: 95.3 kg 90.7 kg   Examination: Physical Exam:  Constitutional: WN/WD obese Caucasian female in no acute distress lying in bed Respiratory: Diminished to auscultation bilaterally, no wheezing, rales, rhonchi or crackles. Normal respiratory effort and patient is not tachypenic. No accessory muscle use.  Unlabored breathing Cardiovascular: RRR, no murmurs / rubs / gallops. S1 and S2 auscultated.  Abdomen: Soft, a little-tender, distended secondary to body habitus. Bowel sounds positive.  GU: Deferred. Musculoskeletal: No clubbing / cyanosis of digits/nails. No joint deformity upper and lower extremities.  Skin: No rashes, lesions, ulcers on limited skin evaluation. No induration; Warm and dry.  Neurologic: CN 2-12 grossly intact with no focal deficits.  Romberg sign and cerebellar reflexes not assessed.  Psychiatric: She is awake and alert has a  pleasant mood and affect  Data Reviewed: I have personally reviewed following labs and imaging studies  CBC: Recent Labs  Lab 01/02/24 1419 01/03/24 0441 01/03/24 1840 01/04/24 0715 01/05/24 0700 01/05/24 1856 01/06/24 0632  WBC 9.3 7.1 9.1 6.7 7.6  --  6.7  NEUTROABS 7.4  --   --   --   --   --  4.8  HGB 11.3* 10.3* 8.3* 8.4* 7.6* 8.5* 7.2*  HCT 37.8 32.8* 26.5* 27.1* 24.7* 27.5* 23.4*  MCV 99.5 97.0 96.7 96.8 96.1  --  97.5  PLT 237 190 170 175 161  --  189   Basic Metabolic Panel: Recent Labs  Lab 01/02/24 1419 01/03/24 0441 01/04/24 0715 01/06/24 0632  NA 139 139 140 137  K 4.0 4.8 4.1 4.4  CL 104 104 106 108  CO2 19* 26 27 23   GLUCOSE 154* 108* 101* 122*  BUN 26* 24* 18 19  CREATININE 1.68* 1.50* 1.43* 1.18*  CALCIUM  8.6* 7.8* 7.9* 8.1*  MG  --   --   --  1.9  PHOS  --   --   --  2.3*   GFR: Estimated Creatinine Clearance: 50.4 mL/min (A) (by C-G formula based on SCr of 1.18 mg/dL (H)). Liver Function Tests: Recent Labs  Lab 01/02/24 1419 01/06/24 0632  AST 23 14*  ALT 6 <5  ALKPHOS 91 53  BILITOT 0.5 0.3  PROT 6.5 5.1*  ALBUMIN 3.6 2.8*   No results for input(s): "LIPASE", "AMYLASE" in the last 168 hours. No results for input(s): "AMMONIA" in the last 168 hours. Coagulation Profile: No results for input(s): "INR", "PROTIME" in the last 168 hours. Cardiac Enzymes: No results for input(s): "CKTOTAL", "CKMB", "CKMBINDEX", "TROPONINI" in the last 168 hours. BNP (last 3 results) No results for input(s): "PROBNP" in the last 8760 hours. HbA1C: No results for input(s): "HGBA1C" in the last 72 hours. CBG: Recent Labs  Lab 01/03/24 1148 01/05/24 2017 01/06/24 0830 01/06/24 1203 01/06/24 1636  GLUCAP 155* 175* 95 122* 134*   Lipid Profile: No results for input(s): "CHOL", "HDL", "LDLCALC", "TRIG", "CHOLHDL", "LDLDIRECT" in the last 72 hours. Thyroid  Function Tests: No results for input(s): "TSH", "T4TOTAL", "FREET4", "T3FREE", "THYROIDAB" in the  last 72 hours. Anemia Panel: No results for input(s): "VITAMINB12", "FOLATE", "FERRITIN", "TIBC", "IRON ", "RETICCTPCT" in the last 72 hours. Sepsis Labs: No results for input(s): "PROCALCITON", "LATICACIDVEN" in the last 168 hours.  Recent Results (from the past 240 hours)  MRSA Next  Gen by PCR, Nasal     Status: None   Collection Time: 01/02/24  9:37 PM   Specimen: Nasal Mucosa; Nasal Swab  Result Value Ref Range Status   MRSA by PCR Next Gen NOT DETECTED NOT DETECTED Final    Comment: (NOTE) The GeneXpert MRSA Assay (FDA approved for NASAL specimens only), is one component of a comprehensive MRSA colonization surveillance program. It is not intended to diagnose MRSA infection nor to guide or monitor treatment for MRSA infections. Test performance is not FDA approved in patients less than 13 years old. Performed at [facility], [address] [place], [place] [address]     Radiology Studies: No results found.  Scheduled Meds:  acetaminophen   1,000 mg Oral TID   carbidopa -levodopa   1 tablet Oral BID   donepezil   10 mg Oral Daily   enoxaparin  (LOVENOX ) injection  40 mg Subcutaneous Q24H   FLUoxetine   80 mg Oral Daily   gabapentin   300 mg Oral q morning   And   gabapentin   600 mg Oral QHS   insulin  aspart  0-5 Units Subcutaneous QHS   insulin  aspart  0-9 Units Subcutaneous TID WC   lamoTRIgine   150 mg Oral BID   levETIRAcetam   750 mg Oral BID   memantine   10 mg Oral BID   midodrine  2.5 mg Oral TID WC   multivitamin with minerals  1 tablet Oral QHS   omega-3 acid ethyl esters  1 g Oral Daily   oxybutynin   5 mg Oral BID   pantoprazole   40 mg Oral Daily   phosphorus  500 mg Oral Once   polyethylene glycol  17 g Oral BID   predniSONE   20 mg Oral Q breakfast   primidone   100 mg Oral BID   rosuvastatin   10 mg Oral QHS   senna-docusate  2 tablet Oral BID   tiZANidine  4 mg Oral TID   traZODone   200 mg Oral QHS   Continuous Infusions:   LOS: 4 days   Aura Leeds, DO [facility] Available via Epic secure chat 7am-7pm After these hours, please refer to coverage provider listed on [email or web address] 01/06/2024, 5:55 PM

## 2024-01-06 NOTE — Plan of Care (Signed)
  Problem: Skin Integrity: Goal: Risk for impaired skin integrity will decrease Outcome: Progressing   Problem: Safety: Goal: Ability to remain free from injury will improve Outcome: Progressing   Problem: Pain Managment: Goal: General experience of comfort will improve and/or be controlled Outcome: Progressing   Problem: Clinical Measurements: Goal: Will remain free from infection Outcome: Progressing

## 2024-01-07 DIAGNOSIS — D62 Acute posthemorrhagic anemia: Secondary | ICD-10-CM | POA: Diagnosis not present

## 2024-01-07 DIAGNOSIS — N179 Acute kidney failure, unspecified: Secondary | ICD-10-CM | POA: Diagnosis not present

## 2024-01-07 DIAGNOSIS — I959 Hypotension, unspecified: Secondary | ICD-10-CM

## 2024-01-07 DIAGNOSIS — M9711XA Periprosthetic fracture around internal prosthetic right knee joint, initial encounter: Secondary | ICD-10-CM | POA: Diagnosis not present

## 2024-01-07 DIAGNOSIS — W19XXXA Unspecified fall, initial encounter: Secondary | ICD-10-CM | POA: Diagnosis not present

## 2024-01-07 LAB — CBC WITH DIFFERENTIAL/PLATELET
Abs Immature Granulocytes: 0.03 10*3/uL (ref 0.00–0.07)
Basophils Absolute: 0 10*3/uL (ref 0.0–0.1)
Basophils Relative: 1 %
Eosinophils Absolute: 0.2 10*3/uL (ref 0.0–0.5)
Eosinophils Relative: 3 %
HCT: 23.8 % — ABNORMAL LOW (ref 36.0–46.0)
Hemoglobin: 7.3 g/dL — ABNORMAL LOW (ref 12.0–15.0)
Immature Granulocytes: 1 %
Lymphocytes Relative: 24 %
Lymphs Abs: 1.6 10*3/uL (ref 0.7–4.0)
MCH: 30.3 pg (ref 26.0–34.0)
MCHC: 30.7 g/dL (ref 30.0–36.0)
MCV: 98.8 fL (ref 80.0–100.0)
Monocytes Absolute: 0.6 10*3/uL (ref 0.1–1.0)
Monocytes Relative: 9 %
Neutro Abs: 4.1 10*3/uL (ref 1.7–7.7)
Neutrophils Relative %: 62 %
Platelets: 196 10*3/uL (ref 150–400)
RBC: 2.41 MIL/uL — ABNORMAL LOW (ref 3.87–5.11)
RDW: 14.4 % (ref 11.5–15.5)
WBC: 6.5 10*3/uL (ref 4.0–10.5)
nRBC: 0.3 % — ABNORMAL HIGH (ref 0.0–0.2)

## 2024-01-07 LAB — RETICULOCYTES
Immature Retic Fract: 33.3 % — ABNORMAL HIGH (ref 2.3–15.9)
RBC.: 2.31 MIL/uL — ABNORMAL LOW (ref 3.87–5.11)
Retic Count, Absolute: 83.4 10*3/uL (ref 19.0–186.0)
Retic Ct Pct: 3.6 % — ABNORMAL HIGH (ref 0.4–3.1)

## 2024-01-07 LAB — COMPREHENSIVE METABOLIC PANEL WITH GFR
ALT: <5 U/L (ref 0–44)
AST: 17 U/L (ref 15–41)
Albumin: 2.6 g/dL — ABNORMAL LOW (ref 3.5–5.0)
Alkaline Phosphatase: 54 U/L (ref 38–126)
Anion gap: 7 (ref 5–15)
BUN: 17 mg/dL (ref 8–23)
CO2: 24 mmol/L (ref 22–32)
Calcium: 8.1 mg/dL — ABNORMAL LOW (ref 8.9–10.3)
Chloride: 110 mmol/L (ref 98–111)
Creatinine, Ser: 1.24 mg/dL — ABNORMAL HIGH (ref 0.44–1.00)
GFR, Estimated: 49 mL/min — ABNORMAL LOW (ref >60)
Glucose, Bld: 90 mg/dL (ref 70–99)
Potassium: 4.6 mmol/L (ref 3.5–5.1)
Sodium: 141 mmol/L (ref 135–145)
Total Bilirubin: 0.5 mg/dL (ref 0.0–1.2)
Total Protein: 4.9 g/dL — ABNORMAL LOW (ref 6.5–8.1)

## 2024-01-07 LAB — FERRITIN: Ferritin: 30 ng/mL (ref 11–307)

## 2024-01-07 LAB — IRON AND TIBC
Iron: 24 ug/dL — ABNORMAL LOW (ref 28–170)
Saturation Ratios: 12 % (ref 10.4–31.8)
TIBC: 204 ug/dL — ABNORMAL LOW (ref 250–450)
UIBC: 180 ug/dL

## 2024-01-07 LAB — VITAMIN B12: Vitamin B-12: 651 pg/mL (ref 180–914)

## 2024-01-07 LAB — PHOSPHORUS: Phosphorus: 2.4 mg/dL — ABNORMAL LOW (ref 2.5–4.6)

## 2024-01-07 LAB — MAGNESIUM: Magnesium: 1.9 mg/dL (ref 1.7–2.4)

## 2024-01-07 LAB — GLUCOSE, CAPILLARY
Glucose-Capillary: 111 mg/dL — ABNORMAL HIGH (ref 70–99)
Glucose-Capillary: 103 mg/dL — ABNORMAL HIGH (ref 70–99)
Glucose-Capillary: 136 mg/dL — ABNORMAL HIGH (ref 70–99)
Glucose-Capillary: 145 mg/dL — ABNORMAL HIGH (ref 70–99)

## 2024-01-07 LAB — FOLATE: Folate: 32.9 ng/mL (ref >5.9)

## 2024-01-07 MED ORDER — MIDODRINE HCL 5 MG PO TABS
5.0000 mg | ORAL_TABLET | Freq: Three times a day (TID) | ORAL | Status: DC
  Administered 2024-01-07 – 2024-01-08 (×3): 5 mg via ORAL
  Filled 2024-01-07 (×4): qty 1

## 2024-01-07 MED ORDER — ALBUMIN HUMAN 25 % IV SOLN
25.0000 g | Freq: Once | INTRAVENOUS | Status: AC
  Administered 2024-01-07: 25 g via INTRAVENOUS
  Filled 2024-01-07: qty 100

## 2024-01-07 NOTE — TOC Progression Note (Addendum)
 Transition of Care [facility]) - Progression Note    Patient Details  Name: Valerie [name] MRN: [id number] Date of Birth: [birth year]  Transition of [facility]) CM/SW Contact  Katrinka Parr, [place] Phone Number: 01/07/2024, 3:19 PM  Clinical Narrative:     S[facility] is approved 5/16-5/20. [facility] [id number] PASRR is still pending. Pt cannot admit until pt has a PASRR number. Informed by provider that pt has low BP today and not ready for DC. [facility] cannot admit over the weekend; they will have a bed for pt Monday if PASRR is approved by then.  TOC will continue to follow.   Expected Discharge Plan: Skilled Nursing Facility Barriers to Discharge: Continued Medical Work up, SNF Pending bed offer  Expected Discharge Plan and Services In-house Referral: Clinical Social Work   Post Acute Care Choice: Skilled Nursing Facility Living arrangements for the past 2 months: Single Family Home                                       Social Determinants of Health (SDOH) Interventions SDOH Screenings   Food Insecurity: No Food Insecurity (01/02/2024)  Housing: Low Risk  (01/02/2024)  Transportation Needs: No Transportation Needs (01/02/2024)  Utilities: Not At Risk (01/02/2024)  Alcohol Screen: Low Risk  (12/04/2022)  Depression (PHQ2-9): Medium Risk (09/21/2023)  Financial Resource Strain: High Risk (01/11/2023)  Physical Activity: Inactive (09/21/2023)  Social Connections: Socially Isolated (10/21/2023)  Stress: Stress Concern Present (09/21/2023)  Tobacco Use: Medium Risk (01/03/2024)    Readmission Risk Interventions     No data to display

## 2024-01-07 NOTE — Progress Notes (Signed)
 Physical Therapy Treatment Patient Details Name: Valerie [name] MRN: [id number] DOB: February 13, 1961 Today's Date: 01/07/2024   History of Present Illness The pt is a 63 yo female presenting 5/11 after a fall in which she landed on her R knee. Pt s/p R TKA on 10/19/23, and found to have periprosthetic distal femur fx. Pt now s/p ORIF R distal femur fx on 5/12. PMH includes: anemia, bipolar disorder, CKD III, dementia, depression, DM II, neuropathy, NSTEMI, OSA, R TKA, bilateral THA, and R foot surgery.    PT Comments  Pt received in the recliner and agreeable to session. Pt reports being up throughout the day for increased mobility. Pt able to tolerate increased gait distance this session, however continues to be limited by RLE pain. Pt demonstrates little RLE WB tolerance and relies heavily on BUE support. Pt is able to complete mobility tasks with increased time and CGA for safety. Pt continues to benefit from PT services to progress toward functional mobility goals.     If plan is discharge home, recommend the following: A lot of help with walking and/or transfers;A lot of help with bathing/dressing/bathroom;Assist for transportation;Help with stairs or ramp for entrance   Can travel by private vehicle        Equipment Recommendations  None recommended by PT    Recommendations for Other Services       Precautions / Restrictions Precautions Precautions: Fall Recall of Precautions/Restrictions: Intact Restrictions Weight Bearing Restrictions Per Provider Order: Yes RLE Weight Bearing Per Provider Order: Weight bearing as tolerated     Mobility  Bed Mobility               General bed mobility comments: Pt in recliner at beginning and end of session    Transfers Overall transfer level: Needs assistance Equipment used: Rolling walker (2 wheels) Transfers: Sit to/from Stand Sit to Stand: Contact guard assist           General transfer comment: from recliner with increased  time to reach upright posture    Ambulation/Gait Ambulation/Gait assistance: Contact guard assist Gait Distance (Feet): 30 Feet Assistive device: Rolling walker (2 wheels) Gait Pattern/deviations: Antalgic, Decreased weight shift to right, Decreased stance time - right, Decreased step length - left, Step-to pattern       General Gait Details: Pt demonstrates step- to pattern with limited RLE WB tolerance and poor L foot clearance with tendency to slide foot forward. Pt able to increase L foot clearance briefly with cues, however unable to maintain with increased fatigue. Heavy reliance on BUE support   Stairs             Wheelchair Mobility     Tilt Bed    Modified Rankin (Stroke Patients Only)       Balance Overall balance assessment: Needs assistance Sitting-balance support: Feet supported, No upper extremity supported Sitting balance-Leahy Scale: Fair Sitting balance - Comments: sitting in recliner   Standing balance support: Bilateral upper extremity supported, During functional activity, Reliant on assistive device for balance Standing balance-Leahy Scale: Poor Standing balance comment: with RW support                            Communication Communication Communication: No apparent difficulties  Cognition Arousal: Alert Behavior During Therapy: WFL for tasks assessed/performed   PT - Cognitive impairments: No apparent impairments  Following commands: Intact      Cueing Cueing Techniques: Verbal cues  Exercises      General Comments        Pertinent Vitals/Pain Pain Assessment Pain Assessment: Faces Faces Pain Scale: Hurts even more Pain Location: RLE Pain Descriptors / Indicators: Discomfort, Grimacing, Guarding, Sore Pain Intervention(s): Limited activity within patient's tolerance, Monitored during session, Repositioned     PT Goals (current goals can now be found in the care plan section)  Acute Rehab PT Goals Patient Stated Goal: progress to being able to go home with follow up therapy at home PT Goal Formulation: With patient/family Time For Goal Achievement: 01/18/24 Progress towards PT goals: Progressing toward goals    Frequency    Min 2X/week       AM-PAC PT "6 Clicks" Mobility   Outcome Measure  Help needed turning from your back to your side while in a flat bed without using bedrails?: A Little Help needed moving from lying on your back to sitting on the side of a flat bed without using bedrails?: A Little Help needed moving to and from a bed to a chair (including a wheelchair)?: A Little Help needed standing up from a chair using your arms (e.g., wheelchair or bedside chair)?: A Little Help needed to walk in hospital room?: A Little Help needed climbing 3-5 steps with a railing? : Total 6 Click Score: 16    End of Session Equipment Utilized During Treatment: Gait belt Activity Tolerance: Patient tolerated treatment well Patient left: in chair;with call bell/phone within reach Nurse Communication: Mobility status;Patient requests pain meds PT Visit Diagnosis: Unsteadiness on feet (R26.81);Other abnormalities of gait and mobility (R26.89);Muscle weakness (generalized) (M62.81);History of falling (Z91.81);Pain     Time: 1914-7829 PT Time Calculation (min) (ACUTE ONLY): 8 min  Charges:    $Gait Training: 8-22 mins PT General Charges $$ ACUTE PT VISIT: 1 Visit                     Michaelle Adolphus, PTA Acute Rehabilitation Services Secure Chat Preferred  [phone]    Michaelle Adolphus 01/07/2024, 3:48 PM

## 2024-01-07 NOTE — Plan of Care (Signed)
  Problem: Fluid Volume: Goal: Ability to maintain a balanced intake and output will improve Outcome: Progressing   Problem: Metabolic: Goal: Ability to maintain appropriate glucose levels will improve Outcome: Progressing   Problem: Skin Integrity: Goal: Risk for impaired skin integrity will decrease Outcome: Progressing   Problem: Tissue Perfusion: Goal: Adequacy of tissue perfusion will improve Outcome: Progressing   Problem: Education: Goal: Knowledge of General Education information will improve Description: Including pain rating scale, medication(s)/side effects and non-pharmacologic comfort measures Outcome: Progressing   Problem: Coping: Goal: Level of anxiety will decrease Outcome: Progressing   Problem: Pain Managment: Goal: General experience of comfort will improve and/or be controlled Outcome: Progressing   Problem: Safety: Goal: Ability to remain free from injury will improve Outcome: Progressing

## 2024-01-07 NOTE — Progress Notes (Signed)
 Occupational Therapy Treatment Patient Details Name: Valerie [name] MRN: [id number] DOB: 07/20/1961 Today's Date: 01/07/2024   History of present illness The pt is a 63 yo female presenting 5/11 after a fall in which she landed on her R knee. Pt s/p R TKA on 10/19/23, and found to have periprosthetic distal femur fx. Pt now s/p ORIF R distal femur fx on 5/12. PMH includes: anemia, bipolar disorder, CKD III, dementia, depression, DM II, neuropathy, NSTEMI, OSA, R TKA, bilateral THA, and R foot surgery.   OT comments  Pt progressing well towards goals. Pt able to progress standing ADLs and toileting today to CGA. Pt continues to be limited by decreased activity tolerance and pain with mobility. Recommendation of  <3 hours of skilled rehab daily to optimize independence levels. Will continue to follow acutely.       If plan is discharge home, recommend the following:  A little help with walking and/or transfers;A little help with bathing/dressing/bathroom   Equipment Recommendations  Other (comment) (Defer to next venue)    Recommendations for Other Services      Precautions / Restrictions Precautions Precautions: Fall Recall of Precautions/Restrictions: Intact Restrictions Weight Bearing Restrictions Per Provider Order: Yes RLE Weight Bearing Per Provider Order: Weight bearing as tolerated       Mobility Bed Mobility Overal bed mobility: Needs Assistance             General bed mobility comments: Pt ambulating from PTA    Transfers Overall transfer level: Needs assistance Equipment used: Rolling walker (2 wheels) Transfers: Bed to chair/wheelchair/BSC       Step pivot transfers: Contact guard assist     General transfer comment: CGA with use of RW     Balance Overall balance assessment: Needs assistance Sitting-balance support: Feet supported, No upper extremity supported Sitting balance-Leahy Scale: Fair     Standing balance support: Bilateral upper extremity  supported, During functional activity, Reliant on assistive device for balance Standing balance-Leahy Scale: Poor Standing balance comment: with RW support       ADL either performed or assessed with clinical judgement   ADL Overall ADL's : Needs assistance/impaired       Lower Body Dressing: Contact guard assist;Sit to/from stand Lower Body Dressing Details (indicate cue type and reason): Pt with adequate standing balance to complete Toilet Transfer: Contact guard assist;Rolling walker (2 wheels);Ambulation Toilet Transfer Details (indicate cue type and reason): Educated on use of GB for support Toileting- Architect and Hygiene: Contact guard assist;Sit to/from stand Toileting - Clothing Manipulation Details (indicate cue type and reason): Adequate standing balance for hygiene     Functional mobility during ADLs: Contact guard assist      Extremity/Trunk Assessment Upper Extremity Assessment Upper Extremity Assessment: Overall WFL for tasks assessed   Lower Extremity Assessment Lower Extremity Assessment: Defer to PT evaluation        Vision   Vision Assessment?: No apparent visual deficits         Communication Communication Communication: No apparent difficulties   Cognition Arousal: Alert Behavior During Therapy: WFL for tasks assessed/performed Cognition: No apparent impairments     Following commands: Intact        Cueing   Cueing Techniques: Verbal cues        General Comments VSS on RA    Pertinent Vitals/ Pain       Pain Assessment Pain Assessment: Faces Faces Pain Scale: Hurts even more Pain Location: RLE Pain Descriptors / Indicators: Discomfort, Grimacing, Guarding, Sore  Pain Intervention(s): Monitored during session   Frequency  Min 2X/week        Progress Toward Goals  OT Goals(current goals can now be found in the care plan section)  Progress towards OT goals: Progressing toward goals  Acute Rehab OT  Goals Patient Stated Goal: To go to rehab OT Goal Formulation: With patient Time For Goal Achievement: 01/18/24 Potential to Achieve Goals: Good ADL Goals Pt Will Perform Grooming: with supervision;standing Pt Will Perform Lower Body Dressing: with supervision;sit to/from stand;with adaptive equipment;sitting/lateral leans Pt Will Transfer to Toilet: ambulating;bedside commode;with min assist Pt Will Perform Toileting - Clothing Manipulation and hygiene: with supervision;sitting/lateral leans;sit to/from stand Additional ADL Goal #1: Pt will complete bed mobility with supervision.  Plan         AM-PAC OT "6 Clicks" Daily Activity     Outcome Measure   Help from another person eating meals?: None Help from another person taking care of personal grooming?: A Little Help from another person toileting, which includes using toliet, bedpan, or urinal?: A Little Help from another person bathing (including washing, rinsing, drying)?: A Little Help from another person to put on and taking off regular upper body clothing?: A Little Help from another person to put on and taking off regular lower body clothing?: A Little 6 Click Score: 19    End of Session Equipment Utilized During Treatment: Gait belt;Rolling walker (2 wheels)  OT Visit Diagnosis: Other abnormalities of gait and mobility (R26.89);Pain Pain - Right/Left: Right Pain - part of body: Leg;Knee   Activity Tolerance Patient tolerated treatment well   Patient Left in chair;with call bell/phone within reach   Nurse Communication Mobility status        Time: 1610-9604 OT Time Calculation (min): 10 min  Charges: OT General Charges $OT Visit: 1 Visit OT Treatments $Self Care/Home Management : 8-22 mins  Delmer Ferraris, OT  Acute Rehabilitation Services Office [phone] Secure chat preferred  Mickael Alamo 01/07/2024, 4:55 PM

## 2024-01-07 NOTE — Progress Notes (Signed)
 PROGRESS NOTE    Valerie [name]  ZOX:[id number] DOB: Dec 10, 1960 DOA: 01/02/2024 PCP: Vevelyn Gowers, NP   Brief Narrative:  Valerie [name] is a 63 y.o. female with medical history significant for anxiety/depression, NSTEMI in 2021, diabetes, CKD stage IIIB, peripheral neuropathy, bipolar disorder, hypothyroidism, psoriatic arthritis, CVA, dementia, OSA and a recent right total knee replacement in February 2025 by Dr. Agatha Horsfall who presented to the ER for evaluation of right leg pain after a fall.  Patient reports she was out in downtown eating for Mother's Day. Afterwards, a friend was picking her up and as she was walking, she tripped over a curb, fell and landed directly on her right knee.  Started having severe right leg pain and noticed deformity of the proximal part of her right knee.    She was unable to bear weight so some bystanders picked her up and her friend drove her to the ED.  She denies any dizziness, headaches, vision changes, fevers, chills, chest pain, syncope or leg swelling.  Reports a history of falls with her last fall in February 2025 prior to her knee replacement.  She is also scheduled for back surgery on May/29.   **Interim History Found have a periprosthetic fracture around her internal prosthetic right knee joint and orthopedic surgery was consulted and took her for an ORIF on 5/12.  Postoperatively she has an acute anemia drop and has been hypotensive.  Will continue to monitor her blood count and blood pressure and we have now added Midodrine and Albumin and will continue for now.   Assessment and Plan:  Periprosthetic fracture around internal prosthetic right knee joint: S/p ORIF 5/12. WBAT as tolerated. Ortho wants DOAC at discharge w/ Apixaban and currently on Enoxaparin  40 mg sq daily while hospitalized. Continue prn IV dilaudid , prn oxycodone  for pain. PT/OT Recommending SNF and agreeable. Currently has bed and Insurance Auth but awaiting PASRR.   Acute postoperative  anemia due to expected blood loss: Hgb/Hct Trend: Recent Labs  Lab 01/03/24 0441 01/03/24 1840 01/04/24 0715 01/05/24 0700 01/05/24 1856 01/06/24 0632 01/07/24 0328  HGB 10.3* 8.3* 8.4* 7.6* 8.5* 7.2* 7.3*  HCT 32.8* 26.5* 27.1* 24.7* 27.5* 23.4* 23.8*  MCV 97.0 96.7 96.8 96.1  --  97.5 98.8  -Checked Anemia Panel in the AM and showed an iron  level of 24, UIBC of 180, TIBC of 204, saturation is of 12%, ferritin level 30, folate of 32.9 and vitamin B12 651. CTM for S/Sx of Bleeding; No overt bleeding noted and hemoglobin appears to be stabilized. Repeat CBC in the AM   Essential HTN -> Hypotension: C/w Hydralazine  10 mg po q6hPRN SBP >811. Hold Antihypertensives. Given IVF Bolus 5/14 and mIVF with NS @ 75 mL/hr but this is now stopped. Given Albumin 25 g x1 again. Ordered Midodrine 2.5 mg po TIDwm and increasing to 5 mg po TIDwm. CTM BP per Protocol. Last BP reading was on the softer side and better than this AM and is 105/50 now   Acute kidney injury superimposed on stage 3b chronic kidney disease [facility]): BUN/Cr Trend improved and stabilized: Recent Labs  Lab 01/02/24 1419 01/03/24 0441 01/04/24 0715 01/06/24 0632 01/07/24 0328  BUN 26* 24* 18 19 17   CREATININE 1.68* 1.50* 1.43* 1.18* 1.24*  -IVF with NS now stopped -Avoid Nephrotoxic Medications, Contrast Dyes, Hypotension and Dehydration to Ensure Adequate Renal Perfusion and will need to Renally Adjust Meds. CTM and Trend Renal Function carefully and repeat CMP in the AM   Immunosuppression due to  drug therapy for psoriatic arthritis On chronic prednisone  5 mg daily and Enbrel 2x weekly. Pt did received 10 mg IV decadron  with surgery yesterday. If BP remains borderline and pt does not need PRBC transfusion, will consider stress dose steroids and now on Prednisone  20 mg po Daily and will continue for now and C/w Enbrel @ D/C.   Constipation with Fecal Impaction: C/w Senna-Docusate 2 tab BID, Miralax  17 grams BID, Bisacodyl   Suppository. Had to be manually disimpacted by Nursing today  Bipolar Disorder/Depression/Anxiety: C/w Fluoxetine  80 mg po daily, Lamotrigine  150 mg po BID, Levitiracetam 750 mg po BID, and Trazodone  200 mg po qHS  Parkinson's Disease w/o dyskinesia/Neuromuscular Disorder: C/w Carbidopa -Levodopa  25-100 mg 1 tab po BID and Primidone  100 mg po BID  Hypophosphatemia: Phos Level is now 2.4. Replete with po Kphos Neutral 500 mg x1. CTM and Replete as Necessary. Repeat Phos level in the AM  Alzheimer's Dementia: C/w Donepezil  10 mg po at bedtime and Memantine  10 mg po BID   Fall with Right Periprosthetic Fx: S/p ORIF; PT/OT recommending CIR but not a candidate so now agreeable to SNF   Hyperlipidemia associated with T2DM (HCC): C/w Rosuvastatin  10 mg po qHS   DM Type 2 (diabetes mellitus, type 2) complicated by Neuropathy [facility]): Resume Sensitive Novolog  SSI AC. C/w Gabapentin  300 mg po qAM and 600 mg at bedtime. CBGs ranging from 88-136  Class II Obesity: Complicates overall prognosis and care. Estimated body mass index is 36.58 kg/m as calculated from the following:   Height as of this encounter: 5\' 2"  (1.575 m).   Weight as of this encounter: 90.7 kg. Weight Loss and Dietary Counseling given   DVT prophylaxis: enoxaparin  (LOVENOX ) injection 40 mg Start: 01/04/24 0800 SCDs Start: 01/03/24 0949 SCDs Start: 01/02/24 1628    Code Status: Full Code Family Communication: D/w Daughter over the telephone   Disposition Plan:  Level of care: Med-Surg Status is: Inpatient Remains inpatient appropriate because: Awaiting PASRR and will need stability of BP and ensure Hgb is Stable   Consultants:  Orthopedic Surgery  Procedures:  As delineated as above  Antimicrobials:  Anti-infectives (From admission, onward)    Start     Dose/Rate Route Frequency Ordered Stop   01/03/24 1600  ceFAZolin  (ANCEF ) IVPB 2g/100 mL premix        2 g 200 mL/hr over 30 Minutes Intravenous Every 8 hours 01/03/24  0949 01/04/24 0854   01/03/24 0815  vancomycin (VANCOCIN) powder  Status:  Discontinued          As needed 01/03/24 0815 01/03/24 0859   01/03/24 0600  ceFAZolin  (ANCEF ) IVPB 2g/100 mL premix        2 g 200 mL/hr over 30 Minutes Intravenous On call to O.R. 01/02/24 1829 01/03/24 0752   01/03/24 0000  fluconazole  (DIFLUCAN ) tablet 150 mg  Status:  Discontinued        150 mg Oral Every 3 DAYS 01/02/24 1718 01/02/24 1730       Subjective: Seen and examined at bedside and she is doing okay today.  Does better after having a bowel movement.  No nausea or vomiting.  Blood pressures on the low side but she is not symptomatic or lightheaded or dizzy today.  Denies any other concerns or complaints at this time.  Objective: Vitals:   01/07/24 0945 01/07/24 1100 01/07/24 1413 01/07/24 1507  BP: (!) 100/43 (!) 85/36 (!) 105/51 (!) 105/50  Pulse:    (!) 50  Resp:  18  Temp:    98.1 F (36.7 C)  TempSrc:      SpO2:    97%  Weight:      Height:        Intake/Output Summary (Last 24 hours) at 01/07/2024 1815 Last data filed at 01/07/2024 1538 Gross per 24 hour  Intake 441.47 ml  Output 500 ml  Net -58.53 ml   Filed Weights   01/02/24 1331 01/03/24 0659  Weight: 95.3 kg 90.7 kg   Examination: Physical Exam:  Constitutional: WN/WD obese Caucasian female in no acute distress Respiratory: Diminished to auscultation bilaterally, no wheezing, rales, rhonchi or crackles. Normal respiratory effort and patient is not tachypenic. No accessory muscle use.  Unlabored breathing Cardiovascular: RRR, no murmurs / rubs / gallops. S1 and S2 auscultated.  Abdomen: Soft, non-tender, distended secondary to body habitus. Bowel sounds positive.  GU: Deferred. Musculoskeletal: Right extremity is wrapped Skin: No rashes, lesions, ulcers on limited skin evaluation. No induration; Warm and dry.  Neurologic: CN 2-12 grossly intact with no focal deficits. Romberg sign and cerebellar reflexes not assessed.   Psychiatric: Normal judgment and insight. Alert and oriented x 3.   Data Reviewed: I have personally reviewed following labs and imaging studies  CBC: Recent Labs  Lab 01/02/24 1419 01/03/24 0441 01/03/24 1840 01/04/24 0715 01/05/24 0700 01/05/24 1856 01/06/24 0632 01/07/24 0328  WBC 9.3   < > 9.1 6.7 7.6  --  6.7 6.5  NEUTROABS 7.4  --   --   --   --   --  4.8 4.1  HGB 11.3*   < > 8.3* 8.4* 7.6* 8.5* 7.2* 7.3*  HCT 37.8   < > 26.5* 27.1* 24.7* 27.5* 23.4* 23.8*  MCV 99.5   < > 96.7 96.8 96.1  --  97.5 98.8  PLT 237   < > 170 175 161  --  189 196   < > = values in this interval not displayed.   Basic Metabolic Panel: Recent Labs  Lab 01/02/24 1419 01/03/24 0441 01/04/24 0715 01/06/24 0632 01/07/24 0328  NA 139 139 140 137 141  K 4.0 4.8 4.1 4.4 4.6  CL 104 104 106 108 110  CO2 19* 26 27 23 24   GLUCOSE 154* 108* 101* 122* 90  BUN 26* 24* 18 19 17   CREATININE 1.68* 1.50* 1.43* 1.18* 1.24*  CALCIUM  8.6* 7.8* 7.9* 8.1* 8.1*  MG  --   --   --  1.9 1.9  PHOS  --   --   --  2.3* 2.4*   GFR: Estimated Creatinine Clearance: 48 mL/min (A) (by C-G formula based on SCr of 1.24 mg/dL (H)). Liver Function Tests: Recent Labs  Lab 01/02/24 1419 01/06/24 0632 01/07/24 0328  AST 23 14* 17  ALT 6 <5 <5  ALKPHOS 91 53 54  BILITOT 0.5 0.3 0.5  PROT 6.5 5.1* 4.9*  ALBUMIN 3.6 2.8* 2.6*   No results for input(s): "LIPASE", "AMYLASE" in the last 168 hours. No results for input(s): "AMMONIA" in the last 168 hours. Coagulation Profile: No results for input(s): "INR", "PROTIME" in the last 168 hours. Cardiac Enzymes: No results for input(s): "CKTOTAL", "CKMB", "CKMBINDEX", "TROPONINI" in the last 168 hours. BNP (last 3 results) No results for input(s): "PROBNP" in the last 8760 hours. HbA1C: No results for input(s): "HGBA1C" in the last 72 hours. CBG: Recent Labs  Lab 01/06/24 1636 01/06/24 2052 01/07/24 0804 01/07/24 1144 01/07/24 1738  GLUCAP 134* 88 111* 103* 136*  Lipid Profile: No results for input(s): "CHOL", "HDL", "LDLCALC", "TRIG", "CHOLHDL", "LDLDIRECT" in the last 72 hours. Thyroid  Function Tests: No results for input(s): "TSH", "T4TOTAL", "FREET4", "T3FREE", "THYROIDAB" in the last 72 hours. Anemia Panel: Recent Labs    01/07/24 0328  VITAMINB12 651  FOLATE 32.9  FERRITIN 30  TIBC 204*  IRON  24*  RETICCTPCT 3.6*   Sepsis Labs: No results for input(s): "PROCALCITON", "LATICACIDVEN" in the last 168 hours.  Recent Results (from the past 240 hours)  MRSA Next Gen by PCR, Nasal     Status: None   Collection Time: 01/02/24  9:37 PM   Specimen: Nasal Mucosa; Nasal Swab  Result Value Ref Range Status   MRSA by PCR Next Gen NOT DETECTED NOT DETECTED Final    Comment: (NOTE) The GeneXpert MRSA Assay (FDA approved for NASAL specimens only), is one component of a comprehensive MRSA colonization surveillance program. It is not intended to diagnose MRSA infection nor to guide or monitor treatment for MRSA infections. Test performance is not FDA approved in patients less than 72 years old. Performed at [facility], [address], [place], [place] [address]     Radiology Studies: No results found.  Scheduled Meds:  acetaminophen   1,000 mg Oral TID   carbidopa -levodopa   1 tablet Oral BID   donepezil   10 mg Oral Daily   enoxaparin  (LOVENOX ) injection  40 mg Subcutaneous Q24H   FLUoxetine   80 mg Oral Daily   gabapentin   300 mg Oral q morning   And   gabapentin   600 mg Oral QHS   insulin  aspart  0-5 Units Subcutaneous QHS   insulin  aspart  0-9 Units Subcutaneous TID WC   lamoTRIgine   150 mg Oral BID   levETIRAcetam   750 mg Oral BID   memantine   10 mg Oral BID   midodrine  5 mg Oral TID WC   multivitamin with minerals  1 tablet Oral QHS   omega-3 acid ethyl esters  1 g Oral Daily   oxybutynin   5 mg Oral BID   pantoprazole   40 mg Oral Daily   polyethylene glycol  17 g Oral BID   predniSONE   20 mg Oral Q breakfast    primidone   100 mg Oral BID   rosuvastatin   10 mg Oral QHS   senna-docusate  2 tablet Oral BID   tiZANidine  4 mg Oral TID   traZODone   200 mg Oral QHS   Continuous Infusions:   LOS: 5 days   Aura Leeds, DO [facility] Available via Epic secure chat 7am-7pm After these hours, please refer to coverage provider listed on [email or web address] 01/07/2024, 6:15 PM

## 2024-01-08 DIAGNOSIS — N179 Acute kidney failure, unspecified: Secondary | ICD-10-CM | POA: Diagnosis not present

## 2024-01-08 DIAGNOSIS — M9711XA Periprosthetic fracture around internal prosthetic right knee joint, initial encounter: Secondary | ICD-10-CM | POA: Diagnosis not present

## 2024-01-08 DIAGNOSIS — D62 Acute posthemorrhagic anemia: Secondary | ICD-10-CM | POA: Diagnosis not present

## 2024-01-08 DIAGNOSIS — W19XXXA Unspecified fall, initial encounter: Secondary | ICD-10-CM | POA: Diagnosis not present

## 2024-01-08 LAB — CBC WITH DIFFERENTIAL/PLATELET
Abs Immature Granulocytes: 0.04 10*3/uL (ref 0.00–0.07)
Basophils Absolute: 0 10*3/uL (ref 0.0–0.1)
Basophils Relative: 0 %
Eosinophils Absolute: 0.3 10*3/uL (ref 0.0–0.5)
Eosinophils Relative: 5 %
HCT: 22.8 % — ABNORMAL LOW (ref 36.0–46.0)
Hemoglobin: 7.2 g/dL — ABNORMAL LOW (ref 12.0–15.0)
Immature Granulocytes: 1 %
Lymphocytes Relative: 26 %
Lymphs Abs: 1.7 10*3/uL (ref 0.7–4.0)
MCH: 30.5 pg (ref 26.0–34.0)
MCHC: 31.6 g/dL (ref 30.0–36.0)
MCV: 96.6 fL (ref 80.0–100.0)
Monocytes Absolute: 0.7 10*3/uL (ref 0.1–1.0)
Monocytes Relative: 10 %
Neutro Abs: 3.8 10*3/uL (ref 1.7–7.7)
Neutrophils Relative %: 58 %
Platelets: 189 10*3/uL (ref 150–400)
RBC: 2.36 MIL/uL — ABNORMAL LOW (ref 3.87–5.11)
RDW: 14.5 % (ref 11.5–15.5)
WBC: 6.5 10*3/uL (ref 4.0–10.5)
nRBC: 0.3 % — ABNORMAL HIGH (ref 0.0–0.2)

## 2024-01-08 LAB — COMPREHENSIVE METABOLIC PANEL WITH GFR
ALT: <5 U/L (ref 0–44)
AST: 18 U/L (ref 15–41)
Albumin: 3 g/dL — ABNORMAL LOW (ref 3.5–5.0)
Alkaline Phosphatase: 53 U/L (ref 38–126)
Anion gap: 6 (ref 5–15)
BUN: 21 mg/dL (ref 8–23)
CO2: 25 mmol/L (ref 22–32)
Calcium: 8.6 mg/dL — ABNORMAL LOW (ref 8.9–10.3)
Chloride: 107 mmol/L (ref 98–111)
Creatinine, Ser: 1.29 mg/dL — ABNORMAL HIGH (ref 0.44–1.00)
GFR, Estimated: 46 mL/min — ABNORMAL LOW (ref >60)
Glucose, Bld: 96 mg/dL (ref 70–99)
Potassium: 4.4 mmol/L (ref 3.5–5.1)
Sodium: 138 mmol/L (ref 135–145)
Total Bilirubin: 0.6 mg/dL (ref 0.0–1.2)
Total Protein: 5.1 g/dL — ABNORMAL LOW (ref 6.5–8.1)

## 2024-01-08 LAB — GLUCOSE, CAPILLARY
Glucose-Capillary: 94 mg/dL (ref 70–99)
Glucose-Capillary: 147 mg/dL — ABNORMAL HIGH (ref 70–99)
Glucose-Capillary: 136 mg/dL — ABNORMAL HIGH (ref 70–99)
Glucose-Capillary: 136 mg/dL — ABNORMAL HIGH (ref 70–99)

## 2024-01-08 LAB — MAGNESIUM: Magnesium: 1.9 mg/dL (ref 1.7–2.4)

## 2024-01-08 LAB — PHOSPHORUS: Phosphorus: 3.1 mg/dL (ref 2.5–4.6)

## 2024-01-08 MED ORDER — MIDODRINE HCL 5 MG PO TABS
2.5000 mg | ORAL_TABLET | Freq: Three times a day (TID) | ORAL | Status: DC
  Administered 2024-01-08 – 2024-01-10 (×6): 2.5 mg via ORAL
  Filled 2024-01-08 (×5): qty 1

## 2024-01-08 NOTE — Progress Notes (Signed)
 PROGRESS NOTE    Valerie [name]  JYN:[id number] DOB: Jan 15, 1961 DOA: 01/02/2024 PCP: Vevelyn Gowers, NP   Brief Narrative:  Valerie [name] is a 63 y.o. female with medical history significant for anxiety/depression, NSTEMI in 2021, diabetes, CKD stage IIIB, peripheral neuropathy, bipolar disorder, hypothyroidism, psoriatic arthritis, CVA, dementia, OSA and a recent right total knee replacement in February 2025 by Dr. Agatha Horsfall who presented to the ER for evaluation of right leg pain after a fall.  Patient reports she was out in downtown eating for Mother's Day. Afterwards, a friend was picking her up and as she was walking, she tripped over a curb, fell and landed directly on her right knee.  Started having severe right leg pain and noticed deformity of the proximal part of her right knee.    She was unable to bear weight so some bystanders picked her up and her friend drove her to the ED.  She denies any dizziness, headaches, vision changes, fevers, chills, chest pain, syncope or leg swelling.  Reports a history of falls with her last fall in February 2025 prior to her knee replacement.  She is also scheduled for back surgery on May/29.   **Interim History Found have a periprosthetic fracture around her internal prosthetic right knee joint and orthopedic surgery was consulted and took her for an ORIF on 5/12.  Postoperatively she has an acute anemia drop and has been hypotensive.  Will continue to monitor her blood count and blood pressure and we have now added Midodrine .  Globin has appeared to stabilize in the low sevens and blood pressure is improving so we will start weaning back on the midodrine .  She appears to be medically stable for discharge just awaiting PASRR.   Assessment and Plan:  Periprosthetic fracture around internal prosthetic right knee joint: S/p ORIF 5/12. WBAT as tolerated. Ortho wants DOAC at discharge w/ Apixaban  and currently on Enoxaparin  40 mg sq daily while hospitalized.  Continue prn IV dilaudid , prn oxycodone  for pain. PT/OT Recommending SNF and agreeable. Currently has bed and Insurance Auth but awaiting PASRR.   Acute postoperative anemia due to expected blood loss: Hgb/Hct Trend now fairly stable: Recent Labs  Lab 01/03/24 1840 01/04/24 0715 01/05/24 0700 01/05/24 1856 01/06/24 0632 01/07/24 0328 01/08/24 0436  HGB 8.3* 8.4* 7.6* 8.5* 7.2* 7.3* 7.2*  HCT 26.5* 27.1* 24.7* 27.5* 23.4* 23.8* 22.8*  MCV 96.7 96.8 96.1  --  97.5 98.8 96.6  -Checked Anemia Panel in the AM and showed an iron  level of 24, UIBC of 180, TIBC of 204, saturation is of 12%, ferritin level 30, folate of 32.9 and vitamin B12 651. CTM for S/Sx of Bleeding; No overt bleeding noted and hemoglobin appears to be stabilized. Repeat CBC in the AM   Essential HTN -> Hypotension: C/w Hydralazine  10 mg po q6hPRN SBP >170. Hold Antihypertensives. Given IVF Bolus 5/14 and mIVF with NS @ 75 mL/hr but this is now stopped. Given Albumin  25 g x1 again. Ordered Midodrine  2.5 mg po TIDwm and increased to 5 mg po TIDwm yesterday but will cut back again given improvement in BP. CTM BP per Protocol. Last BP reading was on the softer side and better than this AM and is 129/71 now   Acute kidney injury superimposed on stage 3b chronic kidney disease [facility]): BUN/Cr Trend improved and stabilized: Recent Labs  Lab 01/02/24 1419 01/03/24 0441 01/04/24 0715 01/06/24 0632 01/07/24 0328 01/08/24 0436  BUN 26* 24* 18 19 17 21   CREATININE 1.68* 1.50* 1.43*  1.18* 1.24* 1.29*  -IVF with NS now stopped -Avoid Nephrotoxic Medications, Contrast Dyes, Hypotension and Dehydration to Ensure Adequate Renal Perfusion and will need to Renally Adjust Meds. CTM and Trend Renal Function carefully and repeat CMP in the AM   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 On chronic prednisone  5 mg daily and Enbrel 2x weekly. Pt did received 10 mg IV decadron  with surgery yesterday. If BP remains borderline and  pt does not need PRBC transfusion, will consider stress dose steroids and now on Prednisone  20 mg po Daily and will continue for now and C/w Enbrel @ D/C.   Constipation with Fecal Impaction: C/w Senna-Docusate 2 tab BID, Miralax  17 grams BID, Bisacodyl  Suppository. Had to be manually disimpacted by Nursing 5/15  Bipolar Disorder/Depression/Anxiety: C/w Fluoxetine  80 mg po daily, Lamotrigine  150 mg po BID, Levitiracetam 750 mg po BID, and Trazodone  200 mg po qHS  Parkinson's Disease w/o dyskinesia/Neuromuscular Disorder: C/w Carbidopa -Levodopa  25-100 mg 1 tab po BID and Primidone  100 mg po BID  Hypophosphatemia: Phos Level is now 3.1. Replete with po Kphos Neutral 500 mg x1. CTM and Replete as Necessary. Repeat Phos level in the AM  Alzheimer's Dementia: C/w Donepezil  10 mg po at bedtime and Memantine  10 mg po BID   Fall with Right Periprosthetic Fx: S/p ORIF; PT/OT recommending CIR but not a candidate so now agreeable to SNF   Hyperlipidemia associated with T2DM (HCC): C/w Rosuvastatin  10 mg po qHS   DM Type 2 (diabetes mellitus, type 2) complicated by Neuropathy [facility]): Resume Sensitive Novolog  SSI AC. C/w Gabapentin  300 mg po qAM and 600 mg at bedtime. CBGs ranging from 94-147  Class II Obesity: Complicates overall prognosis and care. Estimated body mass index is 36.58 kg/m as calculated from the following:   Height as of this encounter: 5\' 2"  (1.575 m).   Weight as of this encounter: 90.7 kg. Weight Loss and Dietary Counseling given   DVT prophylaxis: enoxaparin  (LOVENOX ) injection 40 mg Start: 01/04/24 0800 SCDs Start: 01/03/24 0949 SCDs Start: 01/02/24 1628    Code Status: Full Code Family Communication: D/w daughter @ bedside  Disposition Plan:  Level of care: Med-Surg Status is: Inpatient Remains inpatient appropriate because: Needs SNF and appears to be medically stable but awaiting PASRR number and likely can be D/C'd 5/19   Consultants:  Orthopedic Surgery  Procedures:   As delineated as above  Antimicrobials:  Anti-infectives (From admission, onward)    Start     Dose/Rate Route Frequency Ordered Stop   01/03/24 1600  ceFAZolin  (ANCEF ) IVPB 2g/100 mL premix        2 g 200 mL/hr over 30 Minutes Intravenous Every 8 hours 01/03/24 0949 01/04/24 0854   01/03/24 0815  vancomycin  (VANCOCIN ) powder  Status:  Discontinued          As needed 01/03/24 0815 01/03/24 0859   01/03/24 0600  ceFAZolin  (ANCEF ) IVPB 2g/100 mL premix        2 g 200 mL/hr over 30 Minutes Intravenous On call to O.R. 01/02/24 1829 01/03/24 0752   01/03/24 0000  fluconazole  (DIFLUCAN ) tablet 150 mg  Status:  Discontinued        150 mg Oral Every 3 DAYS 01/02/24 1718 01/02/24 1730       Subjective: Seen and examined at bedside and she is sitting in the chair at bedside.  No nausea or vomiting and states her pain is fairly well-controlled.  Feels really good today.  No other concerns or  complaints this time.  Objective: Vitals:   01/08/24 0502 01/08/24 0800 01/08/24 1300 01/08/24 1610  BP: (!) 104/49 137/60 (!) 112/58 129/71  Pulse: (!) 43 (!) 49 (!) 47 (!) 51  Resp: 18 18 17 18   Temp: 98 F (36.7 C)  98 F (36.7 C)   TempSrc:   Oral   SpO2: 100% 100% 96% 97%  Weight:      Height:        Intake/Output Summary (Last 24 hours) at 01/08/2024 1717 Last data filed at 01/08/2024 1059 Gross per 24 hour  Intake 480 ml  Output 2700 ml  Net -2220 ml   Filed Weights   01/02/24 1331 01/03/24 0659  Weight: 95.3 kg 90.7 kg   Examination: Physical Exam:  Constitutional: WN/WD obese Caucasian female in no acute distress sitting in the chair Respiratory: Mildly diminished to auscultation bilaterally, no wheezing, rales, rhonchi or crackles. Normal respiratory effort and patient is not tachypenic. No accessory muscle use.  Unlabored breathing Cardiovascular: RRR, no murmurs / rubs / gallops. S1 and S2 auscultated.  Has some right leg edema with her incisions.  Abdomen: Soft, non-tender,  distended secondary to body habitus. Bowel sounds positive.  GU: Deferred. Musculoskeletal: No clubbing / cyanosis of digits/nails. No joint deformity upper and lower extremities. Skin: Right leg incisions appear clean dry and intact Neurologic: CN 2-12 grossly intact with no focal deficits. Romberg sign and cerebellar reflexes not assessed.  Psychiatric: Normal judgment and insight. Alert and oriented x 3.   Data Reviewed: I have personally reviewed following labs and imaging studies  CBC: Recent Labs  Lab 01/02/24 1419 01/03/24 0441 01/04/24 0715 01/05/24 0700 01/05/24 1856 01/06/24 0632 01/07/24 0328 01/08/24 0436  WBC 9.3   < > 6.7 7.6  --  6.7 6.5 6.5  NEUTROABS 7.4  --   --   --   --  4.8 4.1 3.8  HGB 11.3*   < > 8.4* 7.6* 8.5* 7.2* 7.3* 7.2*  HCT 37.8   < > 27.1* 24.7* 27.5* 23.4* 23.8* 22.8*  MCV 99.5   < > 96.8 96.1  --  97.5 98.8 96.6  PLT 237   < > 175 161  --  189 196 189   < > = values in this interval not displayed.   Basic Metabolic Panel: Recent Labs  Lab 01/03/24 0441 01/04/24 0715 01/06/24 0632 01/07/24 0328 01/08/24 0436  NA 139 140 137 141 138  K 4.8 4.1 4.4 4.6 4.4  CL 104 106 108 110 107  CO2 26 27 23 24 25   GLUCOSE 108* 101* 122* 90 96  BUN 24* 18 19 17 21   CREATININE 1.50* 1.43* 1.18* 1.24* 1.29*  CALCIUM  7.8* 7.9* 8.1* 8.1* 8.6*  MG  --   --  1.9 1.9 1.9  PHOS  --   --  2.3* 2.4* 3.1   GFR: Estimated Creatinine Clearance: 46.1 mL/min (A) (by C-G formula based on SCr of 1.29 mg/dL (H)). Liver Function Tests: Recent Labs  Lab 01/02/24 1419 01/06/24 0632 01/07/24 0328 01/08/24 0436  AST 23 14* 17 18  ALT 6 <5 <5 <5  ALKPHOS 91 53 54 53  BILITOT 0.5 0.3 0.5 0.6  PROT 6.5 5.1* 4.9* 5.1*  ALBUMIN  3.6 2.8* 2.6* 3.0*   No results for input(s): "LIPASE", "AMYLASE" in the last 168 hours. No results for input(s): "AMMONIA" in the last 168 hours. Coagulation Profile: No results for input(s): "INR", "PROTIME" in the last 168  hours. Cardiac Enzymes: No  results for input(s): "CKTOTAL", "CKMB", "CKMBINDEX", "TROPONINI" in the last 168 hours. BNP (last 3 results) No results for input(s): "PROBNP" in the last 8760 hours. HbA1C: No results for input(s): "HGBA1C" in the last 72 hours. CBG: Recent Labs  Lab 01/07/24 1144 01/07/24 1738 01/07/24 2027 01/08/24 0828 01/08/24 1338  GLUCAP 103* 136* 145* 94 147*   Lipid Profile: No results for input(s): "CHOL", "HDL", "LDLCALC", "TRIG", "CHOLHDL", "LDLDIRECT" in the last 72 hours. Thyroid  Function Tests: No results for input(s): "TSH", "T4TOTAL", "FREET4", "T3FREE", "THYROIDAB" in the last 72 hours. Anemia Panel: Recent Labs    01/07/24 0328  VITAMINB12 651  FOLATE 32.9  FERRITIN 30  TIBC 204*  IRON  24*  RETICCTPCT 3.6*   Sepsis Labs: No results for input(s): "PROCALCITON", "LATICACIDVEN" in the last 168 hours.  Recent Results (from the past 240 hours)  MRSA Next Gen by PCR, Nasal     Status: None   Collection Time: 01/02/24  9:37 PM   Specimen: Nasal Mucosa; Nasal Swab  Result Value Ref Range Status   MRSA by PCR Next Gen NOT DETECTED NOT DETECTED Final    Comment: (NOTE) The GeneXpert MRSA Assay (FDA approved for NASAL specimens only), is one component of a comprehensive MRSA colonization surveillance program. It is not intended to diagnose MRSA infection nor to guide or monitor treatment for MRSA infections. Test performance is not FDA approved in patients less than 101 years old. Performed at [facility], [address], [place], [place] [address]     Radiology Studies: No results found.  Scheduled Meds:  acetaminophen   1,000 mg Oral TID   carbidopa -levodopa   1 tablet Oral BID   donepezil   10 mg Oral Daily   enoxaparin  (LOVENOX ) injection  40 mg Subcutaneous Q24H   FLUoxetine   80 mg Oral Daily   gabapentin   300 mg Oral q morning   And   gabapentin   600 mg Oral QHS   insulin  aspart  0-5 Units Subcutaneous QHS   insulin  aspart   0-9 Units Subcutaneous TID WC   lamoTRIgine   150 mg Oral BID   levETIRAcetam   750 mg Oral BID   memantine   10 mg Oral BID   midodrine   2.5 mg Oral TID WC   multivitamin with minerals  1 tablet Oral QHS   omega-3 acid ethyl esters  1 g Oral Daily   oxybutynin   5 mg Oral BID   pantoprazole   40 mg Oral Daily   polyethylene glycol  17 g Oral BID   predniSONE   20 mg Oral Q breakfast   primidone   100 mg Oral BID   rosuvastatin   10 mg Oral QHS   senna-docusate  2 tablet Oral BID   tiZANidine   4 mg Oral TID   traZODone   200 mg Oral QHS   Continuous Infusions:   LOS: 6 days   Aura Leeds, DO [facility] Available via Epic secure chat 7am-7pm After these hours, please refer to coverage provider listed on [email or web address] 01/08/2024, 5:17 PM

## 2024-01-08 NOTE — Plan of Care (Signed)
  Problem: Education: Goal: Ability to describe self-care measures that may prevent or decrease complications (Diabetes Survival Skills Education) will improve Outcome: Progressing   Problem: Education: Goal: Knowledge of General Education information will improve Description: Including pain rating scale, medication(s)/side effects and non-pharmacologic comfort measures Outcome: Progressing   Problem: Health Behavior/Discharge Planning: Goal: Ability to manage health-related needs will improve Outcome: Progressing

## 2024-01-08 NOTE — Plan of Care (Signed)
  Problem: Coping: Goal: Ability to adjust to condition or change in health will improve Outcome: Progressing   Problem: Fluid Volume: Goal: Ability to maintain a balanced intake and output will improve Outcome: Progressing   Problem: Nutritional: Goal: Maintenance of adequate nutrition will improve Outcome: Progressing   Problem: Skin Integrity: Goal: Risk for impaired skin integrity will decrease Outcome: Progressing   Problem: Education: Goal: Knowledge of General Education information will improve Description: Including pain rating scale, medication(s)/side effects and non-pharmacologic comfort measures Outcome: Progressing   Problem: Clinical Measurements: Goal: Ability to maintain clinical measurements within normal limits will improve Outcome: Progressing   Problem: Clinical Measurements: Goal: Will remain free from infection Outcome: Progressing   Problem: Pain Managment: Goal: General experience of comfort will improve and/or be controlled Outcome: Progressing

## 2024-01-09 DIAGNOSIS — W19XXXA Unspecified fall, initial encounter: Secondary | ICD-10-CM | POA: Diagnosis not present

## 2024-01-09 DIAGNOSIS — D62 Acute posthemorrhagic anemia: Secondary | ICD-10-CM | POA: Diagnosis not present

## 2024-01-09 DIAGNOSIS — N179 Acute kidney failure, unspecified: Secondary | ICD-10-CM | POA: Diagnosis not present

## 2024-01-09 DIAGNOSIS — M9711XA Periprosthetic fracture around internal prosthetic right knee joint, initial encounter: Secondary | ICD-10-CM | POA: Diagnosis not present

## 2024-01-09 LAB — COMPREHENSIVE METABOLIC PANEL WITH GFR
ALT: 5 U/L (ref 0–44)
AST: 22 U/L (ref 15–41)
Albumin: 3.2 g/dL — ABNORMAL LOW (ref 3.5–5.0)
Alkaline Phosphatase: 66 U/L (ref 38–126)
Anion gap: 9 (ref 5–15)
BUN: 19 mg/dL (ref 8–23)
CO2: 24 mmol/L (ref 22–32)
Calcium: 8.9 mg/dL (ref 8.9–10.3)
Chloride: 104 mmol/L (ref 98–111)
Creatinine, Ser: 1.48 mg/dL — ABNORMAL HIGH (ref 0.44–1.00)
GFR, Estimated: 39 mL/min — ABNORMAL LOW (ref >60)
Glucose, Bld: 145 mg/dL — ABNORMAL HIGH (ref 70–99)
Potassium: 3.7 mmol/L (ref 3.5–5.1)
Sodium: 137 mmol/L (ref 135–145)
Total Bilirubin: 0.6 mg/dL (ref 0.0–1.2)
Total Protein: 5.6 g/dL — ABNORMAL LOW (ref 6.5–8.1)

## 2024-01-09 LAB — CBC WITH DIFFERENTIAL/PLATELET
Abs Immature Granulocytes: 0.08 10*3/uL — ABNORMAL HIGH (ref 0.00–0.07)
Basophils Absolute: 0.1 10*3/uL (ref 0.0–0.1)
Basophils Relative: 1 %
Eosinophils Absolute: 0.4 10*3/uL (ref 0.0–0.5)
Eosinophils Relative: 6 %
HCT: 27.6 % — ABNORMAL LOW (ref 36.0–46.0)
Hemoglobin: 8.4 g/dL — ABNORMAL LOW (ref 12.0–15.0)
Immature Granulocytes: 1 %
Lymphocytes Relative: 24 %
Lymphs Abs: 1.6 10*3/uL (ref 0.7–4.0)
MCH: 29.9 pg (ref 26.0–34.0)
MCHC: 30.4 g/dL (ref 30.0–36.0)
MCV: 98.2 fL (ref 80.0–100.0)
Monocytes Absolute: 0.6 10*3/uL (ref 0.1–1.0)
Monocytes Relative: 9 %
Neutro Abs: 4.1 10*3/uL (ref 1.7–7.7)
Neutrophils Relative %: 59 %
Platelets: 221 10*3/uL (ref 150–400)
RBC: 2.81 MIL/uL — ABNORMAL LOW (ref 3.87–5.11)
RDW: 14.5 % (ref 11.5–15.5)
WBC: 6.9 10*3/uL (ref 4.0–10.5)
nRBC: 0.4 % — ABNORMAL HIGH (ref 0.0–0.2)

## 2024-01-09 LAB — GLUCOSE, CAPILLARY
Glucose-Capillary: 119 mg/dL — ABNORMAL HIGH (ref 70–99)
Glucose-Capillary: 145 mg/dL — ABNORMAL HIGH (ref 70–99)
Glucose-Capillary: 130 mg/dL — ABNORMAL HIGH (ref 70–99)
Glucose-Capillary: 133 mg/dL — ABNORMAL HIGH (ref 70–99)

## 2024-01-09 LAB — PHOSPHORUS: Phosphorus: 3.5 mg/dL (ref 2.5–4.6)

## 2024-01-09 LAB — MAGNESIUM: Magnesium: 1.7 mg/dL (ref 1.7–2.4)

## 2024-01-09 MED ORDER — MAGNESIUM SULFATE 2 GM/50ML IV SOLN
2.0000 g | Freq: Once | INTRAVENOUS | Status: AC
  Administered 2024-01-09: 2 g via INTRAVENOUS
  Filled 2024-01-09: qty 50

## 2024-01-09 MED ORDER — PREDNISONE 10 MG PO TABS
10.0000 mg | ORAL_TABLET | Freq: Every day | ORAL | Status: DC
  Administered 2024-01-10: 10 mg via ORAL
  Filled 2024-01-09: qty 1

## 2024-01-09 NOTE — TOC Progression Note (Signed)
 Transition of Care [facility]) - Progression Note    Patient Details  Name: Valerie [name] MRN: [id number] Date of Birth: 06/07/1961  [facility] CM/SW Contact  Adline [place], [place] Phone Number: 01/09/2024, 3:15 PM  Clinical Narrative:     Patients passr number has been approved, [id number] and is active until 02/06/24  Expected Discharge Plan: Skilled Nursing Facility Barriers to Discharge: Continued Medical Work up, SNF Pending bed offer  Expected Discharge Plan and Services In-house Referral: Clinical Social Work   Post Acute Care Choice: Skilled Nursing Facility Living arrangements for the past 2 months: Single Family Home                                       Social Determinants of Health (SDOH) Interventions SDOH Screenings   Food Insecurity: No Food Insecurity (01/02/2024)  Housing: Low Risk  (01/02/2024)  Transportation Needs: No Transportation Needs (01/02/2024)  Utilities: Not At Risk (01/02/2024)  Alcohol Screen: Low Risk  (12/04/2022)  Depression (PHQ2-9): Medium Risk (09/21/2023)  Financial Resource Strain: High Risk (01/11/2023)  Physical Activity: Inactive (09/21/2023)  Social Connections: Socially Isolated (10/21/2023)  Stress: Stress Concern Present (09/21/2023)  Tobacco Use: Medium Risk (01/03/2024)    Readmission Risk Interventions     No data to display

## 2024-01-09 NOTE — Plan of Care (Signed)
  Problem: Education: Goal: Ability to describe self-care measures that may prevent or decrease complications (Diabetes Survival Skills Education) will improve Outcome: Progressing   Problem: Skin Integrity: Goal: Risk for impaired skin integrity will decrease Outcome: Progressing   Problem: Activity: Goal: Risk for activity intolerance will decrease Outcome: Progressing   Problem: Nutrition: Goal: Adequate nutrition will be maintained Outcome: Progressing   Problem: Pain Managment: Goal: General experience of comfort will improve and/or be controlled Outcome: Progressing   Problem: Safety: Goal: Ability to remain free from injury will improve Outcome: Progressing

## 2024-01-09 NOTE — Plan of Care (Signed)
 Problem: Education: Goal: Ability to describe self-care measures that may prevent or decrease complications (Diabetes Survival Skills Education) will improve 01/09/2024 0649 by Bianca Bud, RN Outcome: Progressing 01/09/2024 0649 by Bianca Bud, RN Outcome: Progressing 01/09/2024 0649 by Bianca Bud, RN Outcome: Progressing Goal: Individualized Educational Video(s) 01/09/2024 0649 by Bianca Bud, RN Outcome: Progressing 01/09/2024 0649 by Bianca Bud, RN Outcome: Progressing 01/09/2024 0649 by Bianca Bud, RN Outcome: Progressing   Problem: Coping: Goal: Ability to adjust to condition or change in health will improve 01/09/2024 0649 by Bianca Bud, RN Outcome: Progressing 01/09/2024 0649 by Bianca Bud, RN Outcome: Progressing 01/09/2024 0649 by Bianca Bud, RN Outcome: Progressing   Problem: Fluid Volume: Goal: Ability to maintain a balanced intake and output will improve 01/09/2024 0649 by Bianca Bud, RN Outcome: Progressing 01/09/2024 0649 by Bianca Bud, RN Outcome: Progressing 01/09/2024 0649 by Bianca Bud, RN Outcome: Progressing   Problem: Health Behavior/Discharge Planning: Goal: Ability to identify and utilize available resources and services will improve 01/09/2024 0649 by Bianca Bud, RN Outcome: Progressing 01/09/2024 0649 by Bianca Bud, RN Outcome: Progressing 01/09/2024 0649 by Bianca Bud, RN Outcome: Progressing Goal: Ability to manage health-related needs will improve 01/09/2024 0649 by Bianca Bud, RN Outcome: Progressing 01/09/2024 0649 by Bianca Bud, RN Outcome: Progressing 01/09/2024 0649 by Bianca Bud, RN Outcome: Progressing   Problem: Metabolic: Goal: Ability to maintain appropriate glucose levels will improve 01/09/2024 0649 by Bianca Bud, RN Outcome: Progressing 01/09/2024 0649 by Bianca Bud, RN Outcome: Progressing 01/09/2024 0649 by Bianca Bud, RN Outcome: Progressing   Problem: Nutritional: Goal:  Maintenance of adequate nutrition will improve 01/09/2024 0649 by Bianca Bud, RN Outcome: Progressing 01/09/2024 0649 by Bianca Bud, RN Outcome: Progressing 01/09/2024 0649 by Bianca Bud, RN Outcome: Progressing Goal: Progress toward achieving an optimal weight will improve 01/09/2024 0649 by Bianca Bud, RN Outcome: Progressing 01/09/2024 0649 by Bianca Bud, RN Outcome: Progressing 01/09/2024 0649 by Bianca Bud, RN Outcome: Progressing   Problem: Skin Integrity: Goal: Risk for impaired skin integrity will decrease 01/09/2024 0649 by Bianca Bud, RN Outcome: Progressing 01/09/2024 0649 by Bianca Bud, RN Outcome: Progressing 01/09/2024 0649 by Bianca Bud, RN Outcome: Progressing   Problem: Tissue Perfusion: Goal: Adequacy of tissue perfusion will improve 01/09/2024 0649 by Bianca Bud, RN Outcome: Progressing 01/09/2024 0649 by Bianca Bud, RN Outcome: Progressing 01/09/2024 0649 by Bianca Bud, RN Outcome: Progressing   Problem: Education: Goal: Knowledge of General Education information will improve Description: Including pain rating scale, medication(s)/side effects and non-pharmacologic comfort measures 01/09/2024 0649 by Bianca Bud, RN Outcome: Progressing 01/09/2024 0649 by Bianca Bud, RN Outcome: Progressing 01/09/2024 0649 by Bianca Bud, RN Outcome: Progressing   Problem: Health Behavior/Discharge Planning: Goal: Ability to manage health-related needs will improve 01/09/2024 0649 by Bianca Bud, RN Outcome: Progressing 01/09/2024 0649 by Bianca Bud, RN Outcome: Progressing 01/09/2024 0649 by Bianca Bud, RN Outcome: Progressing   Problem: Clinical Measurements: Goal: Ability to maintain clinical measurements within normal limits will improve 01/09/2024 0649 by Bianca Bud, RN Outcome: Progressing 01/09/2024 0649 by Bianca Bud, RN Outcome: Progressing 01/09/2024 0649 by Bianca Bud, RN Outcome: Progressing Goal: Will  remain free from infection 01/09/2024 0649 by Bianca Bud, RN Outcome: Progressing 01/09/2024 0649 by Bianca Bud, RN Outcome: Progressing 01/09/2024 0649 by Bianca Bud, RN Outcome: Progressing Goal: Diagnostic test results will improve 01/09/2024 0649 by Bianca Bud, RN Outcome: Progressing 01/09/2024 0649 by Bianca Bud, RN Outcome: Progressing 01/09/2024 0649 by Bianca Bud, RN Outcome: Progressing Goal: Respiratory complications will improve 01/09/2024 0649 by Pincus Bridgeman,  Henry Loge, RN Outcome: Progressing 01/09/2024 0649 by Bianca Bud, RN Outcome: Progressing 01/09/2024 0649 by Bianca Bud, RN Outcome: Progressing Goal: Cardiovascular complication will be avoided 01/09/2024 0649 by Bianca Bud, RN Outcome: Progressing 01/09/2024 0649 by Bianca Bud, RN Outcome: Progressing 01/09/2024 0649 by Bianca Bud, RN Outcome: Progressing   Problem: Activity: Goal: Risk for activity intolerance will decrease 01/09/2024 0649 by Bianca Bud, RN Outcome: Progressing 01/09/2024 0649 by Bianca Bud, RN Outcome: Progressing 01/09/2024 0649 by Bianca Bud, RN Outcome: Progressing   Problem: Nutrition: Goal: Adequate nutrition will be maintained 01/09/2024 0649 by Bianca Bud, RN Outcome: Progressing 01/09/2024 0649 by Bianca Bud, RN Outcome: Progressing 01/09/2024 0649 by Bianca Bud, RN Outcome: Progressing   Problem: Coping: Goal: Level of anxiety will decrease 01/09/2024 0649 by Bianca Bud, RN Outcome: Progressing 01/09/2024 0649 by Bianca Bud, RN Outcome: Progressing 01/09/2024 0649 by Bianca Bud, RN Outcome: Progressing   Problem: Elimination: Goal: Will not experience complications related to bowel motility 01/09/2024 0649 by Bianca Bud, RN Outcome: Progressing 01/09/2024 0649 by Bianca Bud, RN Outcome: Progressing 01/09/2024 0649 by Bianca Bud, RN Outcome: Progressing Goal: Will not experience complications related to urinary  retention 01/09/2024 0649 by Bianca Bud, RN Outcome: Progressing 01/09/2024 0649 by Bianca Bud, RN Outcome: Progressing 01/09/2024 0649 by Bianca Bud, RN Outcome: Progressing   Problem: Pain Managment: Goal: General experience of comfort will improve and/or be controlled 01/09/2024 0649 by Bianca Bud, RN Outcome: Progressing 01/09/2024 0649 by Bianca Bud, RN Outcome: Progressing 01/09/2024 0649 by Bianca Bud, RN Outcome: Progressing   Problem: Safety: Goal: Ability to remain free from injury will improve 01/09/2024 0649 by Bianca Bud, RN Outcome: Progressing 01/09/2024 0649 by Bianca Bud, RN Outcome: Progressing 01/09/2024 0649 by Bianca Bud, RN Outcome: Progressing   Problem: Skin Integrity: Goal: Risk for impaired skin integrity will decrease 01/09/2024 0649 by Bianca Bud, RN Outcome: Progressing 01/09/2024 0649 by Bianca Bud, RN Outcome: Progressing 01/09/2024 0649 by Bianca Bud, RN Outcome: Progressing

## 2024-01-09 NOTE — Progress Notes (Signed)
 PROGRESS NOTE    Valerie [name]  JXB:[id number] DOB: 1961-03-28 DOA: 01/02/2024 PCP: Vevelyn Gowers, NP   Brief Narrative:  Valerie [name] is a 63 y.o. female with medical history significant for anxiety/depression, NSTEMI in 2021, diabetes, CKD stage IIIB, peripheral neuropathy, bipolar disorder, hypothyroidism, psoriatic arthritis, CVA, dementia, OSA and a recent right total knee replacement in February 2025 by Dr. Agatha Horsfall who presented to the ER for evaluation of right leg pain after a fall.  Patient reports she was out in downtown eating for Mother's Day. Afterwards, a friend was picking her up and as she was walking, she tripped over a curb, fell and landed directly on her right knee.  Started having severe right leg pain and noticed deformity of the proximal part of her right knee.    She was unable to bear weight so some bystanders picked her up and her friend drove her to the ED.  She denies any dizziness, headaches, vision changes, fevers, chills, chest pain, syncope or leg swelling.  Reports a history of falls with her last fall in February 2025 prior to her knee replacement.  She is also scheduled for back surgery on May/29.   **Interim History Found have a periprosthetic fracture around her internal prosthetic right knee joint and orthopedic surgery was consulted and took her for an ORIF on 5/12.  Postoperatively she has an acute anemia drop and has been hypotensive.  Will continue to monitor her blood count and blood pressure and we have now added Midodrine .  Globin has appeared to stabilize in the low sevens and blood pressure is improving so we will start weaning back on the midodrine .  She appears to be medically stable for discharge just awaiting PASRR.   Assessment and Plan:  Periprosthetic fracture around internal prosthetic right knee joint: S/p ORIF 5/12. WBAT as tolerated. Ortho wants DOAC at discharge w/ Apixaban  and currently on Enoxaparin  40 mg sq daily while hospitalized.  Continue prn IV dilaudid , prn oxycodone  for pain. PT/OT Recommending SNF and agreeable. Currently has bed and Insurance Auth but awaiting PASRR.   Acute postoperative anemia due to expected blood loss: Hgb/Hct Trend now fairly stable: Recent Labs  Lab 01/04/24 0715 01/05/24 0700 01/05/24 1856 01/06/24 1308 01/07/24 0328 01/08/24 0436 01/09/24 0921  HGB 8.4* 7.6* 8.5* 7.2* 7.3* 7.2* 8.4*  HCT 27.1* 24.7* 27.5* 23.4* 23.8* 22.8* 27.6*  MCV 96.8 96.1  --  97.5 98.8 96.6 98.2  -Checked Anemia Panel in the AM and showed an iron  level of 24, UIBC of 180, TIBC of 204, saturation is of 12%, ferritin level 30, folate of 32.9 and vitamin B12 651. CTM for S/Sx of Bleeding; No overt bleeding noted and hemoglobin appears to be stabilized. Repeat CBC in the AM   Essential HTN -> Hypotension: C/w Hydralazine  10 mg po q6hPRN SBP >170. Hold Antihypertensives. Given IVF Bolus 5/14 and mIVF with NS @ 75 mL/hr but this is now stopped. Given Albumin  25 g x2. C/w Midodrine  2.5 mg po TIDwm. CTM BP per Protocol. Last BP reading was on the softer side and is now 102/47   Acute kidney injury superimposed on stage 3b chronic kidney disease [facility]): BUN/Cr Trend had improved and stablized but now slightly worsening  Recent Labs  Lab 01/02/24 1419 01/03/24 0441 01/04/24 0715 01/06/24 6578 01/07/24 0328 01/08/24 0436 01/09/24 0921  BUN 26* 24* 18 19 17 21 19   CREATININE 1.68* 1.50* 1.43* 1.18* 1.24* 1.29* 1.48*  -IVF with NS now stopped -Avoid Nephrotoxic Medications, Contrast  Dyes, Hypotension and Dehydration to Ensure Adequate Renal Perfusion and will need to Renally Adjust Meds. CTM and Trend Renal Function carefully and repeat CMP in the AM   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 On chronic prednisone  5 mg daily and Enbrel 2x weekly. Pt did received 10 mg IV decadron  with surgery yesterday. If BP remains borderline and pt does not need PRBC transfusion, will consider stress dose  steroids; now on Prednisone  20 mg po Daily and will reduce to 10 mg po daily today and C/w Enbrel @ D/C.   Constipation with Fecal Impaction: C/w Senna-Docusate 2 tab BID, Miralax  17 grams BID, Bisacodyl  Suppository. Had to be manually disimpacted by Nursing 5/15  Bipolar Disorder/Depression/Anxiety: C/w Fluoxetine  80 mg po daily, Lamotrigine  150 mg po BID, Levitiracetam 750 mg po BID, and Trazodone  200 mg po qHS  Parkinson's Disease w/o dyskinesia/Neuromuscular Disorder: C/w Carbidopa -Levodopa  25-100 mg 1 tab po BID and Primidone  100 mg po BID  Hypophosphatemia: Phos Level is now 3.5. CTM and Replete as Necessary. Repeat Phos level in the AM  Alzheimer's Dementia: C/w Donepezil  10 mg po qHS and Memantine  10 mg po BID   Fall with Right Periprosthetic Fx: S/p ORIF; PT/OT recommending CIR but not a candidate so now agreeable to SNF   Hyperlipidemia associated with T2DM (HCC): C/w Rosuvastatin  10 mg po qHS   DM Type 2 (diabetes mellitus, type 2) complicated by Neuropathy [facility]): Resume Sensitive Novolog  SSI AC. C/w Gabapentin  300 mg po qAM and 600 mg at bedtime. CBGs ranging from 119-145  Hypoalbuminemia: Patient's Albumin  Level is now 3.2. CTM and Trend and repeat CMP in the AM  Class II Obesity: Complicates overall prognosis and care. Estimated body mass index is 36.58 kg/m as calculated from the following:   Height as of this encounter: 5\' 2"  (1.575 m).   Weight as of this encounter: 90.7 kg. Weight Loss and Dietary Counseling given   DVT prophylaxis: enoxaparin  (LOVENOX ) injection 40 mg Start: 01/04/24 0800 SCDs Start: 01/03/24 0949 SCDs Start: 01/02/24 1628    Code Status: Full Code Family Communication: No family present @ Bedside  Disposition Plan:  Level of care: Med-Surg Status is: Inpatient Remains inpatient appropriate because: Medically stable for D/C and awaiting PASRR   Consultants:  Orthopedic Surgery  Procedures:  As delineated as above  Antimicrobials:   Anti-infectives (From admission, onward)    Start     Dose/Rate Route Frequency Ordered Stop   01/03/24 1600  ceFAZolin  (ANCEF ) IVPB 2g/100 mL premix        2 g 200 mL/hr over 30 Minutes Intravenous Every 8 hours 01/03/24 0949 01/04/24 0854   01/03/24 0815  vancomycin  (VANCOCIN ) powder  Status:  Discontinued          As needed 01/03/24 0815 01/03/24 0859   01/03/24 0600  ceFAZolin  (ANCEF ) IVPB 2g/100 mL premix        2 g 200 mL/hr over 30 Minutes Intravenous On call to O.R. 01/02/24 1829 01/03/24 0752   01/03/24 0000  fluconazole  (DIFLUCAN ) tablet 150 mg  Status:  Discontinued        150 mg Oral Every 3 DAYS 01/02/24 1718 01/02/24 1730       Subjective: Seen and examined at bedside and she is sitting in the chair and appears calm.  No nausea or vomiting.  States the pains well-controlled but still there is a dull aching pain.  Feels well and denies any concerns or complaints at this time.  Objective: Vitals:  01/08/24 2052 01/09/24 0522 01/09/24 0827 01/09/24 1252  BP: 129/60 122/60 (!) 111/41 (!) 102/47  Pulse: (!) 40 (!) 41 (!) 53 (!) 48  Resp:  18 17   Temp:   97.7 F (36.5 C)   TempSrc:   Oral   SpO2:  97% 98%   Weight:      Height:        Intake/Output Summary (Last 24 hours) at 01/09/2024 1507 Last data filed at 01/09/2024 0900 Gross per 24 hour  Intake 240 ml  Output 1250 ml  Net -1010 ml   Filed Weights   01/02/24 1331 01/03/24 0659  Weight: 95.3 kg 90.7 kg   Examination: Physical Exam:  Constitutional: WN/WD obese Caucasian female in no acute distress sitting in the chair Respiratory: Slightly diminished to auscultation bilaterally, no wheezing, rales, rhonchi or crackles. Normal respiratory effort and patient is not tachypenic. No accessory muscle use.  Unlabored breathing Cardiovascular: RRR, no murmurs / rubs / gallops. S1 and S2 auscultated. Has some R Leg Edema  Abdomen: Soft, non-tender, Distended 2/2 to body habitus  Bowel sounds positive.  GU:  Deferred. Musculoskeletal: No clubbing / cyanosis of digits/nails. No joint deformity upper and lower extremities.  Skin: Right leg incisions appear clean dry and intact.  No facial rashes or lesions on limited skin evaluation Neurologic: CN 2-12 grossly intact with no focal deficits. Romberg sign and cerebellar reflexes not assessed.  Psychiatric: Normal judgment and insight. Alert and oriented x 3.   Data Reviewed: I have personally reviewed following labs and imaging studies  CBC: Recent Labs  Lab 01/05/24 0700 01/05/24 1856 01/06/24 0632 01/07/24 0328 01/08/24 0436 01/09/24 0921  WBC 7.6  --  6.7 6.5 6.5 6.9  NEUTROABS  --   --  4.8 4.1 3.8 4.1  HGB 7.6* 8.5* 7.2* 7.3* 7.2* 8.4*  HCT 24.7* 27.5* 23.4* 23.8* 22.8* 27.6*  MCV 96.1  --  97.5 98.8 96.6 98.2  PLT 161  --  189 196 189 221   Basic Metabolic Panel: Recent Labs  Lab 01/04/24 0715 01/06/24 0632 01/07/24 0328 01/08/24 0436 01/09/24 0921  NA 140 137 141 138 137  K 4.1 4.4 4.6 4.4 3.7  CL 106 108 110 107 104  CO2 27 23 24 25 24   GLUCOSE 101* 122* 90 96 145*  BUN 18 19 17 21 19   CREATININE 1.43* 1.18* 1.24* 1.29* 1.48*  CALCIUM  7.9* 8.1* 8.1* 8.6* 8.9  MG  --  1.9 1.9 1.9 1.7  PHOS  --  2.3* 2.4* 3.1 3.5   GFR: Estimated Creatinine Clearance: 40.2 mL/min (A) (by C-G formula based on SCr of 1.48 mg/dL (H)). Liver Function Tests: Recent Labs  Lab 01/06/24 1610 01/07/24 0328 01/08/24 0436 01/09/24 0921  AST 14* 17 18 22   ALT <5 <5 <5 5  ALKPHOS 53 54 53 66  BILITOT 0.3 0.5 0.6 0.6  PROT 5.1* 4.9* 5.1* 5.6*  ALBUMIN  2.8* 2.6* 3.0* 3.2*   No results for input(s): "LIPASE", "AMYLASE" in the last 168 hours. No results for input(s): "AMMONIA" in the last 168 hours. Coagulation Profile: No results for input(s): "INR", "PROTIME" in the last 168 hours. Cardiac Enzymes: No results for input(s): "CKTOTAL", "CKMB", "CKMBINDEX", "TROPONINI" in the last 168 hours. BNP (last 3 results) No results for input(s):  "PROBNP" in the last 8760 hours. HbA1C: No results for input(s): "HGBA1C" in the last 72 hours. CBG: Recent Labs  Lab 01/08/24 1338 01/08/24 1728 01/08/24 2026 01/09/24 9604 01/09/24 1142  GLUCAP 147* 136* 136* 119* 145*   Lipid Profile: No results for input(s): "CHOL", "HDL", "LDLCALC", "TRIG", "CHOLHDL", "LDLDIRECT" in the last 72 hours. Thyroid  Function Tests: No results for input(s): "TSH", "T4TOTAL", "FREET4", "T3FREE", "THYROIDAB" in the last 72 hours. Anemia Panel: Recent Labs    01/07/24 0328  VITAMINB12 651  FOLATE 32.9  FERRITIN 30  TIBC 204*  IRON  24*  RETICCTPCT 3.6*   Sepsis Labs: No results for input(s): "PROCALCITON", "LATICACIDVEN" in the last 168 hours.  Recent Results (from the past 240 hours)  MRSA Next Gen by PCR, Nasal     Status: None   Collection Time: 01/02/24  9:37 PM   Specimen: Nasal Mucosa; Nasal Swab  Result Value Ref Range Status   MRSA by PCR Next Gen NOT DETECTED NOT DETECTED Final    Comment: (NOTE) The GeneXpert MRSA Assay (FDA approved for NASAL specimens only), is one component of a comprehensive MRSA colonization surveillance program. It is not intended to diagnose MRSA infection nor to guide or monitor treatment for MRSA infections. Test performance is not FDA approved in patients less than 33 years old. Performed at [facility], [address] [place], [place] [address]     Radiology Studies: No results found.  Scheduled Meds:  acetaminophen   1,000 mg Oral TID   carbidopa -levodopa   1 tablet Oral BID   donepezil   10 mg Oral Daily   enoxaparin  (LOVENOX ) injection  40 mg Subcutaneous Q24H   FLUoxetine   80 mg Oral Daily   gabapentin   300 mg Oral q morning   And   gabapentin   600 mg Oral QHS   insulin  aspart  0-5 Units Subcutaneous QHS   insulin  aspart  0-9 Units Subcutaneous TID WC   lamoTRIgine   150 mg Oral BID   levETIRAcetam   750 mg Oral BID   memantine   10 mg Oral BID   midodrine   2.5 mg Oral TID WC    multivitamin with minerals  1 tablet Oral QHS   omega-3 acid ethyl esters  1 g Oral Daily   oxybutynin   5 mg Oral BID   pantoprazole   40 mg Oral Daily   polyethylene glycol  17 g Oral BID   [START ON 01/10/2024] predniSONE   10 mg Oral Q breakfast   primidone   100 mg Oral BID   rosuvastatin   10 mg Oral QHS   senna-docusate  2 tablet Oral BID   tiZANidine   4 mg Oral TID   traZODone   200 mg Oral QHS   Continuous Infusions:  magnesium  sulfate bolus IVPB      LOS: 7 days   Aura Leeds, DO [facility] Available via Epic secure chat 7am-7pm After these hours, please refer to coverage provider listed on [email or web address] 01/09/2024, 3:07 PM

## 2024-01-10 DIAGNOSIS — W19XXXA Unspecified fall, initial encounter: Secondary | ICD-10-CM | POA: Diagnosis not present

## 2024-01-10 DIAGNOSIS — M9711XA Periprosthetic fracture around internal prosthetic right knee joint, initial encounter: Secondary | ICD-10-CM | POA: Diagnosis not present

## 2024-01-10 DIAGNOSIS — D84821 Immunodeficiency due to drugs: Secondary | ICD-10-CM | POA: Diagnosis not present

## 2024-01-10 DIAGNOSIS — D62 Acute posthemorrhagic anemia: Secondary | ICD-10-CM | POA: Diagnosis not present

## 2024-01-10 LAB — CREATININE, SERUM
Creatinine, Ser: 1.45 mg/dL — ABNORMAL HIGH (ref 0.44–1.00)
GFR, Estimated: 40 mL/min — ABNORMAL LOW (ref >60)

## 2024-01-10 LAB — GLUCOSE, CAPILLARY
Glucose-Capillary: 100 mg/dL — ABNORMAL HIGH (ref 70–99)
Glucose-Capillary: 124 mg/dL — ABNORMAL HIGH (ref 70–99)

## 2024-01-10 MED ORDER — MIDODRINE HCL 2.5 MG PO TABS: 2.5000 mg | ORAL_TABLET | Freq: Three times a day (TID) | ORAL | Status: DC

## 2024-01-10 MED ORDER — ALPRAZOLAM 0.5 MG PO TABS: 0.5000 mg | ORAL_TABLET | Freq: Three times a day (TID) | ORAL | 0 refills | Status: AC | PRN

## 2024-01-10 MED ORDER — PREDNISONE 10 MG PO TABS: ORAL_TABLET | ORAL | Status: AC

## 2024-01-10 NOTE — Progress Notes (Signed)
 Orthopaedic Trauma Progress Note  SUBJECTIVE: Patient doing okay today.  Notes some intermittent pain through the leg which is to be expected.  Also having some muscle spasms elevated on the toes which we discussed can be normal given her injury and surgery.  Making progress with therapies.  Feels ready for discharge. Patient denies any significant numbness or tingling throughout the leg.  No chest pain. No SOB. No nausea/vomiting. No other complaints.   OBJECTIVE:  Vitals:   01/10/24 0529 01/10/24 0932  BP: (!) 113/59 (!) 117/54  Pulse: (!) 48 (!) 51  Resp: 18 18  Temp: 98.2 F (36.8 C) 98.1 F (36.7 C)  SpO2: 94% 91%    Opiates Today (MME): Today's  total administered Morphine  Milligram Equivalents: 15 Opiates Yesterday (MME): Yesterday's total administered Morphine  Milligram Equivalents: 45  General: Sitting up in bedside chair.  No acute distress. Respiratory: No increased work of breathing.  Operative Extremity (right lower extremity): Incisions are clean, dry, intact.  Some soreness throughout the thigh as expected.  Endorses sensation to light touch over all aspects of the foot.  Ankle DF/PF intact.  Tolerates gentle knee range of motion.  + DP pulse  IMAGING: Stable post op imaging.   LABS:  Results for orders placed or performed during the hospital encounter of 01/02/24 (from the past 24 hours)  Glucose, capillary     Status: Abnormal   Collection Time: 01/09/24 11:42 AM  Result Value Ref Range   Glucose-Capillary 145 (H) 70 - 99 mg/dL  Glucose, capillary     Status: Abnormal   Collection Time: 01/09/24  6:01 PM  Result Value Ref Range   Glucose-Capillary 130 (H) 70 - 99 mg/dL  Glucose, capillary     Status: Abnormal   Collection Time: 01/09/24  8:47 PM  Result Value Ref Range   Glucose-Capillary 133 (H) 70 - 99 mg/dL  Creatinine, serum     Status: Abnormal   Collection Time: 01/10/24  7:20 AM  Result Value Ref Range   Creatinine, Ser 1.45 (H) 0.44 - 1.00 mg/dL    GFR, Estimated 40 (L) >60 mL/min  Glucose, capillary     Status: Abnormal   Collection Time: 01/10/24  8:46 AM  Result Value Ref Range   Glucose-Capillary 100 (H) 70 - 99 mg/dL    ASSESSMENT: Valerie [name] is a 63 y.o. female, 7 Days Post-Op s/p mechanical fall Procedures: OPEN REDUCTION INTERNAL FIXATION RIGHT PERIPROSTHETIC FRACTURE  CV/Blood loss: Acute blood loss anemia, Hgb 8.4 on 01/09/2024.  Hemodynamically stable  PLAN: Weightbearing: WBAT RLE ROM: Okay for unrestricted hip and knee motion as tolerated Incisional and dressing care: Okay to leave incisions open to air  Showering: Okay to begin getting incisions wet starting 01/06/2024 Orthopedic device(s): None  Pain management:  1. Tylenol  1000 mg 3 times daily  2. Tizanidine  4 mg TID 3. Oxycodone  5-10 mg q 4 hours PRN 4. Dilaudid  1 mg q 6 hours PRN 5. Neurontin  300 mg in AM., 600 mg QHS VTE prophylaxis: Lovenox , SCDs ID:  Ancef  2gm post op completed Foley/Lines:  No foley, KVO IVFs Impediments to Fracture Healing: Vitamin D  level 52, no supplementation needed Dispo: PT/OT evaluation ongoing, recommending SNF.  Continue to monitor blood pressure and work on pain control.  Okay for discharge from ortho standpoint once cleared by medicine team and therapies.  I have signed and placed discharge Rx for pain medication, DVT prophylaxis in patient's chart.  D/C recommendations: - Oxycodone , tizanidine , Tylenol  for pain control -  Eliquis  2.5 mg twice daily x 30 days for DVT prophylaxis - No additional need for Vit D supplementation  Follow - up plan: 2 weeks after d/c for wound check and repeat x-rays   Contact information:  Katheryne Pane MD, Alona Jamaica PA-C. After hours and holidays please check [email or web address] for group call information for [facility]   Edilia Gordon, PA-C [phone] (office) [email or web address]

## 2024-01-10 NOTE — Plan of Care (Signed)

## 2024-01-10 NOTE — TOC Transition Note (Signed)
 Transition of Care [facility]) - Discharge Note   Patient Details  Name: Valerie [name] MRN: [id number] Date of Birth: 1960-10-03  Transition of Care [facility]) CM/SW Contact:  Katrinka Parr, LCSW Phone Number: 01/10/2024, 2:07 PM   Clinical Narrative:      Per MD patient ready for DC to [facility] . RN, patient, patient's family, and facility notified of DC. Discharge Summary and FL2 sent to facility. RN to call report prior to discharge [phone]). DC packet on chart. Ambulance transport requested for patient.   CSW will sign off for now as social work intervention is no longer needed. Please consult us  again if new needs arise.    Final next level of care: Skilled Nursing Facility Barriers to Discharge: No Barriers Identified   Patient Goals and CMS Choice Patient states their goals for this hospitalization and ongoing recovery are:: get back to normal living CMS Medicare.gov Compare Post Acute Care list provided to:: Patient Represenative (must comment) (daughter Hayden Lipoma) Choice offered to / list presented to : Patient, Adult Children      Discharge Placement              Patient chooses bed at: [facility] Patient to be transferred to facility by: [facility] Name of family member notified: Daughter Patient and family notified of of transfer: 01/10/24  Discharge Plan and Services Additional resources added to the After Visit Summary for   In-house Referral: Clinical Social Work   Post Acute Care Choice: Skilled Nursing Facility                               Social Drivers of Health (SDOH) Interventions SDOH Screenings   Food Insecurity: No Food Insecurity (01/02/2024)  Housing: Low Risk  (01/02/2024)  Transportation Needs: No Transportation Needs (01/02/2024)  Utilities: Not At Risk (01/02/2024)  Alcohol Screen: Low Risk  (12/04/2022)  Depression (PHQ2-9): Medium Risk (09/21/2023)  Financial Resource Strain: High Risk (01/11/2023)  Physical Activity:  Inactive (09/21/2023)  Social Connections: Socially Isolated (10/21/2023)  Stress: Stress Concern Present (09/21/2023)  Tobacco Use: Medium Risk (01/03/2024)     Readmission Risk Interventions     No data to display

## 2024-01-10 NOTE — Care Management Important Message (Signed)
 Important Message  Patient Details  Name: Valerie [name] MRN: [id number] Date of Birth: June 15, 1961   Important Message Given:  Yes - Medicare IM     Felix Host 01/10/2024, 2:57 PM

## 2024-01-10 NOTE — Discharge Summary (Signed)
 Physician Discharge Summary   [name] MRN: [id number] DOB: [birth year]  Admit date:     01/02/2024  Discharge date: 01/10/24  Discharge Physician: Valerie Leeds, DO   PCP: Valerie Gowers, NP   Recommendations at discharge:   Follow up w/ PCP w/in 1-2 weeks and repeat CBC, CMP, Mag, Phos with in 1 week Follow up with Orthopedic Surgery w/in 1-2 weeks Follow up w/ Cardiology in the outpatient setting for Sinus Bradycardia Follow up w/ Rheumatology w/in 1-2 weeks and c/w Enbrel @ D/C Follow up w/ Neurology in the outpatient setting and use caution when using Leqembi and Apixaban  together  Discharge Diagnoses: Principal Problem:   Periprosthetic fracture around internal prosthetic right knee joint Active Problems:   Acute postoperative anemia due to expected blood loss   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 *  Hospital Course: Valerie [name] is a 63 y.o. female with medical history significant for anxiety/depression, NSTEMI in 2021, diabetes, CKD stage IIIB, peripheral neuropathy, bipolar disorder, hypothyroidism, psoriatic arthritis, CVA, dementia, OSA and a recent right total knee replacement in February 2025 by Dr. Agatha [name] who presented to the ER for evaluation of right leg pain after a fall.  Patient reports she was out in downtown eating for Mother's Day. Afterwards, a friend was picking her up and as she was walking, she tripped over a curb, fell and landed directly on her right knee.  Started having severe right leg pain and noticed deformity of the proximal part of her right knee.     She was unable to bear weight so some bystanders picked her up and her friend drove her to the ED.  She denies any dizziness, headaches, vision changes, fevers, chills, chest pain, syncope or leg swelling.  Reports a history of falls with her last fall in February 2025 prior to her knee replacement.  She is also scheduled for back surgery on May/29.   **Interim History Found have a periprosthetic fracture around her internal prosthetic right knee joint and orthopedic surgery was consulted and took her for an ORIF on 5/12.  Postoperatively she has an acute anemia drop and has been hypotensive.  Will continue to monitor her blood count and blood pressure and we have now added Midodrine .  Globin has appeared to stabilize in the low sevens and blood pressure is improving so we will start weaning back on the midodrine .  She appears to be medically stable for discharge and now PASRR has been obtained. She will need to F/u with PCP, Orthopedic Surgery, and Cardiology in the outpatient setting.   Assessment and Plan:  Periprosthetic fracture around internal prosthetic right knee joint: S/p ORIF 5/12. WBAT as tolerated. Ortho wants DOAC at discharge w/ Apixaban  and currently on Enoxaparin  40 mg sq daily while hospitalized. Continue prn IV dilaudid , prn oxycodone  for pain. PT/OT Recommending SNF and agreeable. Medically stable for D/C   Acute postoperative anemia due to expected blood loss: Hgb/Hct Trend now fairly stable: Recent Labs  Lab 01/04/24 0715 01/05/24 0700 01/05/24 1856 01/06/24 2841 01/07/24 0328 01/08/24 0436 01/09/24 0921  HGB 8.4*  7.6* 8.5* 7.2* 7.3* 7.2* 8.4*  HCT 27.1* 24.7* 27.5* 23.4* 23.8* 22.8* 27.6*  MCV 96.8 96.1  --  97.5 98.8 96.6 98.2  -Checked Anemia Panel in the AM and showed an iron  level of 24, UIBC of 180, TIBC of 204, saturation is of 12%, ferritin level 30, folate of 32.9 and vitamin B12 651. CTM for S/Sx of Bleeding; No overt bleeding noted and hemoglobin appears to  be stabilized. Repeat CBC in the AM   Essential HTN -> Hypotension: C/w Hydralazine  10 mg po q6hPRN SBP >170. Hold Antihypertensives. Given IVF Bolus 5/14 and mIVF with NS @ 75 mL/hr but this is now stopped. Given Albumin  25 g x2. C/w Midodrine  2.5 mg po TIDwm for now and wean in the outpt setting. CTM BP per Protocol. Last BP reading was on the softer side and is now 117/54   Acute kidney injury superimposed on stage 3b chronic kidney disease [facility]): BUN/Cr Trend had improved and stablized but now slightly worsening  Recent Labs  Lab 01/02/24 1419 01/03/24 0441 01/04/24 0715 01/06/24 0981 01/07/24 0328 01/08/24 0436 01/09/24 0921 01/10/24 0720  BUN 26* 24* 18 19 17 21 19   --   CREATININE 1.68* 1.50* 1.43* 1.18* 1.24* 1.29* 1.48* 1.45*  -IVF with NS now stopped -Avoid Nephrotoxic Medications, Contrast Dyes, Hypotension and Dehydration to Ensure Adequate Renal Perfusion and will need to Renally Adjust Meds. CTM and Trend Renal Function carefully and repeat CMP in the AM   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 On Chronic Prednisone  5 mg daily and Enbrel 2x weekly. Pt did received 10 mg IV decadron  with surgery yesterday. If BP remains borderline and pt does not need PRBC transfusion, will consider stress dose steroids; now on Prednisone  20 mg po Daily and will reduce to 10 mg po daily for now and wean further in the next 3 days back to 5 mg po Daily and C/w Enbrel @ D/C.   Constipation with Fecal Impaction: C/w Senna-Docusate 2 tab BID, Miralax  17 grams BID, Bisacodyl  Suppository. Had to be manually disimpacted by Nursing 5/15  Sinus Bradycardia: Asymptomatic and has been in Sinus Bradycardia since 5/16. EKG showed a rate of 50 today and otherwise normal EKG. CTM and Avoid AV Nodal Blocking agents. F/u w/ Cardiology in the outpatient setting for further evaluation if necessary  Bipolar Disorder/Depression/Anxiety: C/w Fluoxetine  80 mg po daily, Lamotrigine  150 mg po  BID, Levitiracetam 750 mg po BID, and Trazodone  200 mg po qHS  Parkinson's Disease w/o dyskinesia/Neuromuscular Disorder: C/w Carbidopa -Levodopa  25-100 mg 1 tab po BID and Primidone  100 mg po BID  Hypophosphatemia: Phos Level is now 3.5 on last check. CTM and Replete as Necessary. Repeat Phos level in the AM  Alzheimer's Dementia: C/w Donepezil  10 mg po qHS and Memantine  10 mg po BID   Fall with Right Periprosthetic Fx: S/p ORIF; PT/OT recommending CIR but not a candidate so now agreeable to SNF and medically stable for D/C   Hyperlipidemia associated with T2DM (HCC): C/w Rosuvastatin  10 mg po qHS   DM Type 2 (diabetes mellitus, type 2) complicated by Neuropathy [facility]): Resume Sensitive Novolog  SSI AC. C/w Gabapentin  300 mg po qAM and 600 mg at bedtime. CBGs ranging from 100-145  Hypoalbuminemia: Patient's Albumin  Level is now 3.2 on last check. CTM and Trend and repeat CMP in the AM  Class II Obesity: Complicates overall prognosis and care. Estimated body mass index is 36.58 kg/m as calculated from the following:   Height as  of this encounter: 5\' 2"  (1.575 m).   Weight as of this encounter: 90.7 kg. Weight Loss and Dietary Counseling given  Consultants: Orthopedic Surgey Procedures performed: As delineated as above  Disposition: Skilled nursing facility  Diet recommendation:  Regular diet  DISCHARGE MEDICATION: Allergies as of 01/10/2024       Reactions   Carbamazepine Other (See Comments)   Parkinsons like symptoms tremors   Sertraline Hcl Other (See Comments)   Parkinsons like symptoms        Medication List     STOP taking these medications    aspirin  EC 81 MG tablet   baclofen  10 MG tablet Commonly known as: LIORESAL        TAKE these medications    acetaminophen  500 MG tablet Commonly known as: TYLENOL  Take 2 tablets (1,000 mg total) by mouth every 8 (eight) hours as needed for mild pain (pain score 1-3), fever or headache.   ALPRAZolam  0.5 MG  tablet Commonly known as: XANAX  Take 1 tablet (0.5 mg total) by mouth 3 (three) times daily as needed for up to 3 days for anxiety.   apixaban  2.5 MG Tabs tablet Commonly known as: Eliquis  Take 1 tablet (2.5 mg total) by mouth 2 (two) times daily.   carbidopa -levodopa  25-100 MG tablet Commonly known as: SINEMET  IR Take 1 tablet by mouth in the morning and at bedtime.   donepezil  10 MG tablet Commonly known as: ARICEPT  Take 10 mg by mouth daily.   Enbrel 50 MG/ML injection Generic drug: etanercept Inject 50 mg into the skin See admin instructions. Take 50 mg 2 times a week for 4 weeks take 50 mg once  a week   FISH OIL PO Take 2 capsules by mouth daily.   fluconazole  150 MG tablet Commonly known as: DIFLUCAN  Take 150 mg by mouth every 3 (three) days.   FLUoxetine  40 MG capsule Commonly known as: PROZAC  Take 1 capsule (40 mg total) by mouth daily. What changed: how much to take   gabapentin  300 MG capsule Commonly known as: NEURONTIN  Take 300-600 mg by mouth See admin instructions. Take 300 mg in the morning and 600 mg at night   lamoTRIgine  150 MG tablet Commonly known as: LAMICTAL  Take 1 tablet by mouth 2 (two) times daily.   LEQEMBI IV Inject into the vein every 14 (fourteen) days.   levETIRAcetam  750 MG tablet Commonly known as: KEPPRA  Take 750 mg by mouth 2 (two) times daily.   levocetirizine 5 MG tablet Commonly known as: XYZAL  Take 1 tablet (5 mg total) by mouth every evening.   meclizine  25 MG tablet Commonly known as: ANTIVERT  Take 25 mg by mouth 3 (three) times daily as needed for dizziness.   memantine  10 MG tablet Commonly known as: NAMENDA  Take 10 mg by mouth 2 (two) times daily.   midodrine  2.5 MG tablet Commonly known as: PROAMATINE  Take 1 tablet (2.5 mg total) by mouth 3 (three) times daily with meals.   multivitamin with minerals tablet Take 1 tablet by mouth at bedtime.   nitroGLYCERIN  0.4 MG SL tablet Commonly known as:  NITROSTAT  Place 1 tablet (0.4 mg total) under the tongue every 5 (five) minutes as needed for chest pain.   omeprazole  40 MG capsule Commonly known as: PRILOSEC Take 1 capsule (40 mg total) by mouth daily.   ondansetron  8 MG tablet Commonly known as: ZOFRAN  Take 1 tablet (8 mg total) by mouth every 8 (eight) hours as needed for nausea or vomiting.   oxybutynin   5 MG tablet Commonly known as: DITROPAN  Take 5 mg by mouth 2 (two) times daily.   Oxycodone  HCl 10 MG Tabs Take 0.5-1 tablets (5-10 mg total) by mouth every 4 (four) hours as needed for severe pain (pain score 7-10) or moderate pain (pain score 4-6) (5 mg pain score 4-6, 10 mg pain socre 7-10). What changed:  medication strength how much to take reasons to take this   polyethylene glycol 17 g packet Commonly known as: MIRALAX  / GLYCOLAX  Take 17 g by mouth 2 (two) times daily.   predniSONE  10 MG tablet Commonly known as: DELTASONE  Take 1 tablet (10 mg total) by mouth daily with breakfast for 3 days, THEN 0.5 tablets (5 mg total) daily with breakfast. Start taking on: Jan 11, 2024 What changed:  medication strength See the new instructions.   primidone  50 MG tablet Commonly known as: MYSOLINE  Take 100 mg by mouth 2 (two) times daily.   rizatriptan  10 MG tablet Commonly known as: MAXALT  Take 10 mg by mouth as needed for migraine. May repeat in 2 hours if needed   rosuvastatin  10 MG tablet Commonly known as: CRESTOR  Take 1 tablet (10 mg total) by mouth daily.   sennosides-docusate sodium  8.6-50 MG tablet Commonly known as: SENOKOT-S Take 2 tablets by mouth daily.   STOOL SOFTENER PO Take 1 tablet by mouth in the morning and at bedtime.   tiZANidine  4 MG tablet Commonly known as: ZANAFLEX  Take 1 tablet (4 mg total) by mouth 3 (three) times daily. What changed: See the new instructions.   traZODone  100 MG tablet Commonly known as: DESYREL  Take 2 tablets (200 mg total) by mouth at bedtime.                Discharge Care Instructions  (From admission, onward)           Start     Ordered   01/10/24 0000  Discharge wound care:       Comments: Per Orthopedic Surgery; Reinforce dressing   01/10/24 1248           Discharge Exam: Filed Weights   01/02/24 1331 01/03/24 0659  Weight: 95.3 kg 90.7 kg   Vitals:   01/10/24 0529 01/10/24 0932  BP: (!) 113/59 (!) 117/54  Pulse: (!) 48 (!) 51  Resp: 18 18  Temp: 98.2 F (36.8 C) 98.1 F (36.7 C)  SpO2: 94% 91%   Examination: Physical Exam:  Constitutional: WN/WD, obese Caucasian female in no acute distress appears calm Respiratory: Slightly diminished to auscultation bilaterally, no wheezing, rales, rhonchi or crackles. Normal respiratory effort and patient is not tachypenic. No accessory muscle use.  Unlabored breathing Cardiovascular: Bradycardic rate but regular rhythm. no murmurs / rubs / gallops. S1 and S2 auscultated.  Has some right lower extremity edema Abdomen: Soft, non-tender, distended secondary to body habitus. Bowel sounds positive.  GU: Deferred. Musculoskeletal: No clubbing / cyanosis of digits/nails. No joint deformity upper and lower extremities.  Skin: No induration; Warm and dry.  Neurologic: CN 2-12 grossly intact with no focal deficits. Romberg sign and cerebellar reflexes not assessed.  Psychiatric: Normal judgment and insight. Awake and alert  Condition at discharge: stable  The results of significant diagnostics from this hospitalization (including imaging, microbiology, ancillary and laboratory) are listed below for reference.   Imaging Studies: DG FEMUR PORT, MIN 2 VIEWS RIGHT Result Date: 01/03/2024 CLINICAL DATA:  Fracture, postop. EXAM: RIGHT FEMUR PORTABLE 2 VIEW COMPARISON:  Preoperative imaging FINDINGS: Lateral plate and  screw fixation of comminuted distal femur fracture. Improved fracture alignment from preoperative imaging. Previous knee and hip arthroplasty. Recent postsurgical change  includes air and edema in the soft tissues. IMPRESSION: ORIF of comminuted distal femur fracture. Electronically Signed   By: Chadwick Colonel M.D.   On: 01/03/2024 11:39   DG FEMUR, MIN 2 VIEWS RIGHT Result Date: 01/03/2024 CLINICAL DATA:  Elective surgery. EXAM: RIGHT FEMUR 2 VIEWS COMPARISON:  Preoperative imaging FINDINGS: Five fluoroscopic spot views of the right femur submitted from the operating room. Lateral plate and screw fixation of comminuted distal femur fracture. Previous knee and hip arthroplasty. Fluoroscopy time 57.5 seconds. Dose 5.87 mGy. IMPRESSION: Intraoperative fluoroscopy during distal femur fracture fixation. Electronically Signed   By: Chadwick Colonel M.D.   On: 01/03/2024 11:38   DG C-Arm 1-60 Min-No Report Result Date: 01/03/2024 Fluoroscopy was utilized by the requesting physician.  No radiographic interpretation.   DG Chest Port 1 View Result Date: 01/02/2024 CLINICAL DATA:  Preop, femur fracture EXAM: PORTABLE CHEST 1 VIEW COMPARISON:  Radiograph 10/21/2023 FINDINGS: Stable mild cardiomegaly.The cardiomediastinal contours are unchanged. The lungs are clear. Pulmonary vasculature is normal. No consolidation, pleural effusion, or pneumothorax. Remote right rib fracture. No acute osseous abnormalities are seen. IMPRESSION: Stable mild cardiomegaly. No acute chest findings. Electronically Signed   By: Chadwick Colonel M.D.   On: 01/02/2024 14:55   DG Femur Min 2 Views Right Result Date: 01/02/2024 CLINICAL DATA:  Trip and fall with injury to the right lower extremity. EXAM: PELVIS - 1-2 VIEW; RIGHT FEMUR 2 VIEWS; RIGHT KNEE - COMPLETE 4+ VIEW COMPARISON:  None Available. FINDINGS: Pelvis: Bilateral hip arthroplasties are intact were visualized. No periprosthetic fracture. No acute fracture of the pelvis. Pubic rami are intact. Femur: Periprosthetic fracture will be described below. The femoral shaft is intact. Knee: Knee arthroplasty is in place. Acute distal femur fracture  extends to the femoral component with significant displacement. Osseous overriding is at least 5 cm. The tibial and femoral components appear congruent. IMPRESSION: 1. Acute displaced and overriding distal femur fracture extending to the femoral component of the knee arthroplasty. 2. No acute fracture of the pelvis.  Intact hip arthroplasties. Electronically Signed   By: Chadwick Colonel M.D.   On: 01/02/2024 14:54   DG Knee Complete 4 Views Right Result Date: 01/02/2024 CLINICAL DATA:  Trip and fall with injury to the right lower extremity. EXAM: PELVIS - 1-2 VIEW; RIGHT FEMUR 2 VIEWS; RIGHT KNEE - COMPLETE 4+ VIEW COMPARISON:  None Available. FINDINGS: Pelvis: Bilateral hip arthroplasties are intact were visualized. No periprosthetic fracture. No acute fracture of the pelvis. Pubic rami are intact. Femur: Periprosthetic fracture will be described below. The femoral shaft is intact. Knee: Knee arthroplasty is in place. Acute distal femur fracture extends to the femoral component with significant displacement. Osseous overriding is at least 5 cm. The tibial and femoral components appear congruent. IMPRESSION: 1. Acute displaced and overriding distal femur fracture extending to the femoral component of the knee arthroplasty. 2. No acute fracture of the pelvis.  Intact hip arthroplasties. Electronically Signed   By: Chadwick Colonel M.D.   On: 01/02/2024 14:54   DG Pelvis 1-2 Views Result Date: 01/02/2024 CLINICAL DATA:  Trip and fall with injury to the right lower extremity. EXAM: PELVIS - 1-2 VIEW; RIGHT FEMUR 2 VIEWS; RIGHT KNEE - COMPLETE 4+ VIEW COMPARISON:  None Available. FINDINGS: Pelvis: Bilateral hip arthroplasties are intact were visualized. No periprosthetic fracture. No acute fracture of the pelvis. Pubic rami  are intact. Femur: Periprosthetic fracture will be described below. The femoral shaft is intact. Knee: Knee arthroplasty is in place. Acute distal femur fracture extends to the femoral  component with significant displacement. Osseous overriding is at least 5 cm. The tibial and femoral components appear congruent. IMPRESSION: 1. Acute displaced and overriding distal femur fracture extending to the femoral component of the knee arthroplasty. 2. No acute fracture of the pelvis.  Intact hip arthroplasties. Electronically Signed   By: Chadwick Colonel M.D.   On: 01/02/2024 14:54   Microbiology: Results for orders placed or performed during the hospital encounter of 01/02/24  MRSA Next Gen by PCR, Nasal     Status: None   Collection Time: 01/02/24  9:37 PM   Specimen: Nasal Mucosa; Nasal Swab  Result Value Ref Range Status   MRSA by PCR Next Gen NOT DETECTED NOT DETECTED Final    Comment: (NOTE) The GeneXpert MRSA Assay (FDA approved for NASAL specimens only), is one component of a comprehensive MRSA colonization surveillance program. It is not intended to diagnose MRSA infection nor to guide or monitor treatment for MRSA infections. Test performance is not FDA approved in patients less than 33 years old. Performed at [facility], [address], [place], [place] [address]    Labs: CBC: Recent Labs  Lab 01/05/24 0700 01/05/24 1856 01/06/24 6010 01/07/24 0328 01/08/24 0436 01/09/24 0921  WBC 7.6  --  6.7 6.5 6.5 6.9  NEUTROABS  --   --  4.8 4.1 3.8 4.1  HGB 7.6* 8.5* 7.2* 7.3* 7.2* 8.4*  HCT 24.7* 27.5* 23.4* 23.8* 22.8* 27.6*  MCV 96.1  --  97.5 98.8 96.6 98.2  PLT 161  --  189 196 189 221   Basic Metabolic Panel: Recent Labs  Lab 01/04/24 0715 01/06/24 0632 01/07/24 0328 01/08/24 0436 01/09/24 0921 01/10/24 0720  NA 140 137 141 138 137  --   K 4.1 4.4 4.6 4.4 3.7  --   CL 106 108 110 107 104  --   CO2 27 23 24 25 24   --   GLUCOSE 101* 122* 90 96 145*  --   BUN 18 19 17 21 19   --   CREATININE 1.43* 1.18* 1.24* 1.29* 1.48* 1.45*  CALCIUM  7.9* 8.1* 8.1* 8.6* 8.9  --   MG  --  1.9 1.9 1.9 1.7  --   PHOS  --  2.3* 2.4* 3.1 3.5  --    Liver  Function Tests: Recent Labs  Lab 01/06/24 9323 01/07/24 0328 01/08/24 0436 01/09/24 0921  AST 14* 17 18 22   ALT <5 <5 <5 5  ALKPHOS 53 54 53 66  BILITOT 0.3 0.5 0.6 0.6  PROT 5.1* 4.9* 5.1* 5.6*  ALBUMIN  2.8* 2.6* 3.0* 3.2*   CBG: Recent Labs  Lab 01/09/24 1142 01/09/24 1801 01/09/24 2047 01/10/24 0846 01/10/24 1237  GLUCAP 145* 130* 133* 100* 124*   Discharge time spent: greater than 30 minutes.  Signed: Aura Leeds, DO [facility] 01/10/2024

## 2024-01-19 ENCOUNTER — Ambulatory Visit: Attending: Physician Assistant | Admitting: Physician Assistant

## 2024-01-19 ENCOUNTER — Encounter: Payer: Self-pay | Admitting: Physician Assistant

## 2024-01-19 VITALS — BP 73/44 | HR 49 | Ht 62.0 in | Wt 210.0 lb

## 2024-01-19 DIAGNOSIS — I2510 Atherosclerotic heart disease of native coronary artery without angina pectoris: Secondary | ICD-10-CM | POA: Diagnosis not present

## 2024-01-19 DIAGNOSIS — I959 Hypotension, unspecified: Secondary | ICD-10-CM

## 2024-01-19 MED ORDER — MIDODRINE HCL 5 MG PO TABS: 5.0000 mg | ORAL_TABLET | Freq: Three times a day (TID) | ORAL | 0 refills | Status: DC

## 2024-01-19 NOTE — Patient Instructions (Signed)
 Medication Instructions:  INCREASE MIDODRINE  TO 5MG  THREE TIMES A DAY; IF SYSTOLIC BLOOD PRESSURE REMAINS <100 MAY INCREASE TO  10 MG THREE TIMES A DAY *If you need a refill on your cardiac medications before your next appointment, please call your pharmacy*  Lab Work: URINALYSIS TODAY If you have labs (blood work) drawn today and your tests are completely normal, you will receive your results only by: MyChart Message (if you have MyChart) OR A paper copy in the mail If you have any lab test that is abnormal or we need to change your treatment, we will call you to review the results.  Testing/Procedures:[address] Your physician has requested that you have an echocardiogram. Echocardiography is a painless test that uses sound waves to create images of your heart. It provides your doctor with information about the size and shape of your heart and how well your heart's chambers and valves are working. This procedure takes approximately one hour. There are no restrictions for this procedure. Please do NOT wear cologne, perfume, aftershave, or lotions (deodorant is allowed). Please arrive 15 minutes prior to your appointment time.  Please note: We ask at that you not bring children with you during ultrasound (echo/ vascular) testing. Due to room size and safety concerns, children are not allowed in the ultrasound rooms during exams. Our front office staff cannot provide observation of children in our lobby area while testing is being conducted. An adult accompanying a patient to their appointment will only be allowed in the ultrasound room at the discretion of the ultrasound technician under special circumstances. We apologize for any inconvenience.   Follow-Up: At [facility], you and your health needs are our priority.  As part of our continuing mission to provide you with exceptional heart care, our providers are all part of one team.  This team includes your primary Cardiologist  (physician) and Advanced Practice Providers or APPs (Physician Assistants and Nurse Practitioners) who all work together to provide you with the care you need, when you need it.  Your next appointment:   2 week(s)  Provider:   Ervin Heath, PA

## 2024-01-19 NOTE — Progress Notes (Unsigned)
 Cardiology Office Note:  .   Date:  01/21/2024  ID:  Valerie [name], DOB [birth year], MRN [id number] PCP: Vevelyn Gowers, NP  [facility] Providers Cardiologist:  Euell Herrlich, MD     History of Present Illness: Valerie [name]   Valerie [name] is a 63 y.o. female with PMH of nonobstructive CAD on cath 10/2019, HLD, prediabetes, neuropathy, history of CVA, bipolar, GAD, seizure, CKD and OSA on CPAP.  Patient was admitted in the hospital in March 2021 with chest pain and found to have elevated troponin.  Subsequent cardiac catheterization demonstrated nonobstructive CAD, LVEDP 25 mmHg.  Potential etiology behind chest pain included hypertensive heart disease, stress-induced cardiomyopathy or small vessel disease.  Echocardiogram obtained during the hospitalization showed EF of 60 to 65%, normal EF, no regional wall motion abnormality.  More recently, patient was seen by Katlyn West NP on 10/15/2023 for preop clearance.  She was able to achieve more than 4 METS of activity, therefore was felt to be acceptable risk to proceed with upcoming surgery.  He underwent a right total knee surgery on 10/19/2023 by Dr. Agatha Horsfall, shortly after discharge, patient was readmitted to the hospital after a fall and the development of confusion.  She was diagnosed with acute cystitis.  Urine culture was positive for E. coli ESBL.  Patient was treated with Rocephin  and fosfomycin.  Patient was readmitted to the hospital on 01/02/2024 after developed a right leg pain secondary to a fall.  According to hospital record, patient [place] out in downtown eating for Mother's Day when she tripped over a curb and fell directly on her right knee.  X-ray of the right knee and femur showed acute displaced and overriding distal femur fracture.  She was seen by orthopedic surgery and underwent ORIF of the right supracondylar distal femur fracture surgery by Dr. Curtiss Dowdy on 01/03/2024.  She was in the hospital for more than a week.  Postop course complicated  by acute anemia and hypotension.  Antihypertensives were held.  Midodrine  was added.  She was discharged on 2.5 mg twice a day of Eliquis  for VTE prophylaxis.  Patient presents today for follow-up.  Her initial blood pressure was 73/44, on manual repeat by myself blood pressure came up to 86/54.  Patient denies any dizziness or feeling of passing out.  She does have fatigue and occasional blurry vision.  I will increase her midodrine  to 5 mg 3 times a day with instruction to further increase it to 10 mg 3 times a day as needed if systolic blood pressure remain less than 100 mmHg.  She just had CBC and CMP on Monday, we have reviewed quested the lab result from her facility which is [facility].  We will obtain outpatient echocardiogram.  I will see the patient back in 2 weeks for reassessment  ROS:   She denies chest pain, palpitations, dyspnea, pnd, orthopnea, n, v, dizziness, syncope, edema, weight gain, or early satiety. All other systems reviewed and are otherwise negative except as noted above.    Studies Reviewed: Valerie [name]   EKG Interpretation Date/Time:  Wednesday Jan 19 2024 13:34:45 EDT Ventricular Rate:  47 PR Interval:  202 QRS Duration:  82 QT Interval:  462 QTC Calculation: 408 R Axis:   -5  Text Interpretation: Sinus bradycardia Low voltage QRS Possible Anterolateral infarct , age undetermined When compared with ECG of 10-Jan-2024 09:59, Borderline criteria for Anterior infarct are now Present Borderline criteria for Anterolateral infarct are now Present Confirmed by Heddie Lively,  Daissy Yerian [phone]) on 01/19/2024 1:36:31 PM     Risk Assessment/Calculations:           Physical Exam:   VS:  BP (!) 73/44 (BP Location: Left Arm, Patient Position: Sitting, Cuff Size: Large)   Pulse (!) 49   Ht 5\' 2"  (1.575 m)   Wt 210 lb (95.3 kg)   SpO2 94%   BMI 38.41 kg/m    Wt Readings from Last 3 Encounters:  01/19/24 210 lb (95.3 kg)  01/03/24 200 lb (90.7 kg)  10/21/23 194 lb 0.1 oz (88  kg)    GEN: Well nourished, well developed in no acute distress NECK: No JVD; No carotid bruits CARDIAC: RRR, no murmurs, rubs, gallops RESPIRATORY:  Clear to auscultation without rales, wheezing or rhonchi  ABDOMEN: Soft, non-tender, non-distended EXTREMITIES:  No edema; No deformity   ASSESSMENT AND PLAN: .    Hypotension: On manual recheck by myself, blood pressure came up to 86/54.  Patient was recently discharged on low-dose midodrine , will increase midodrine  to 5 mg 3 times a day with instruction to further increase it to 10 mg 3 times a day as needed if systolic blood pressure remain less than 100 mmHg.  She recently had blood work at [facility], we will request the blood work results.  Obtain urinalysis to rule out UTI.  CAD: Denies any chest pain.       Dispo: Follow-up in 2 weeks for reassessment.  Signed, Ervin Heath, PA

## 2024-01-21 LAB — URINALYSIS, ROUTINE W REFLEX MICROSCOPIC
Bilirubin, UA: NEGATIVE
Glucose, UA: NEGATIVE
Ketones, UA: NEGATIVE
Nitrite, UA: POSITIVE — AB
Protein,UA: NEGATIVE
RBC, UA: NEGATIVE
Specific Gravity, UA: 1.012 (ref 1.005–1.030)
Urobilinogen, Ur: 0.2 mg/dL (ref 0.2–1.0)
pH, UA: 6 (ref 5.0–7.5)

## 2024-01-21 LAB — MICROSCOPIC EXAMINATION
Casts: NONE SEEN /LPF
RBC, Urine: NONE SEEN /HPF (ref 0–2)
WBC, UA: >30 /HPF — AB (ref 0–5)

## 2024-01-22 ENCOUNTER — Ambulatory Visit: Payer: Self-pay | Admitting: Physician Assistant

## 2024-01-22 ENCOUNTER — Other Ambulatory Visit: Payer: Self-pay | Admitting: Physician Assistant

## 2024-01-24 MED ORDER — SULFAMETHOXAZOLE-TRIMETHOPRIM 800-160 MG PO TABS: 1.0000 | ORAL_TABLET | Freq: Two times a day (BID) | ORAL | 0 refills | Status: DC

## 2024-01-24 MED ORDER — SULFAMETHOXAZOLE-TRIMETHOPRIM 800-160 MG PO TABS
1.0000 | ORAL_TABLET | Freq: Two times a day (BID) | ORAL | 0 refills | Status: DC
Start: 2024-01-24 — End: 2024-04-03

## 2024-01-24 NOTE — Telephone Encounter (Signed)
-----   Message from Ervin Heath sent at 01/22/2024 11:53 AM EDT ----- Evidence of urinary tract infection. Please send a paper Rx of Bactrim DS 800mg /160mg  twice a day for 7 days to [facility]

## 2024-01-24 NOTE — Telephone Encounter (Signed)
 Had Hao Meng sign prescription and faxed to [phone].

## 2024-01-25 ENCOUNTER — Other Ambulatory Visit: Payer: Self-pay | Admitting: Specialist

## 2024-01-25 DIAGNOSIS — Z452 Encounter for adjustment and management of vascular access device: Secondary | ICD-10-CM

## 2024-01-26 NOTE — Telephone Encounter (Signed)
 Patient is returning phone call. Patient stated she is heading to an appt and requested we call back this afternoon.

## 2024-01-31 NOTE — Discharge Instructions (Addendum)
 Implanted Port Insertion After Care       After the procedure, it is common to have discomfort at the port insertion site, as well as bruising on the skin over the port. This should improve over 3-4 days.    After your port is placed, you will get a manufacturer's information card. The card has information about your port. Keep this card with you at all times.      Follow instructions from your health care provider about how to take care of your port insertion site. Make sure you wash your hands with soap and water  for at least 20 seconds before and after you change your bandage (dressing). If soap and water  are not available, use hand sanitizer.  Leave your initial bandage on for a full 24 hours. After 24 hours you may remove the dressing and shower.  Do not scrub directly on the incision site but rather above it and let the soapy water  run over the incision. Pat dry after. You may then opt to redress your incision for your comfort but you may just leave it open to air.  The Dermabond (surgical super glue) will protect your incision and keep it clean and dry. Leave the layer of skin glue in place. If the glue edges start to loosen and curl up, you may trim the loose edges but do not pull or pick at it. Do NOT apply neosporin or other antibacterial ointment to the surgical glue.  It will dissolve the glue and expose your new incision to possible infection.  Do NOT apply EMLA numbing cream to the surgical glue.  It will dissolve the glue and expose your new incision to possible infection.  You may have to wait to use the EMLA cream until your incision has healed.    Return to your normal activities as told by your health care provider. Ask your health care provider what activities are safe for you. You may resume over-the-counter and prescription medicines as prescribed by your health care provider. Do not take baths, swim, or use a hot tub until your incision has healed completely (usually 2 weeks).      You were given a sedative during the procedure, and itcan affect you for several hours. Do not drive, operate machinery or sign important documents for 24 hours after your procedure.   You may resume Eliquis  tomorrow morning.    Please contact our office at [phone] and ask to speak with the nurse if you have any signs of an infection, such as fever or chills, redness, swelling, or pain around your port insertion site, fluid or blood coming from your port insertion site, if your port insertion site feels warm to the touch and/or if you have pus or a bad smell coming from the port insertion site.       If you need to speak to someone after hours, please call the [facility] on-call service at [phone] and tell them you are a patient of Dr. Aleen Huron and you had a portacath placed today, as well as any issues you are having.    Thank you for visiting [facility] today!

## 2024-02-02 ENCOUNTER — Ambulatory Visit: Attending: Physician Assistant | Admitting: Physician Assistant

## 2024-02-02 ENCOUNTER — Other Ambulatory Visit: Payer: Self-pay | Admitting: *Deleted

## 2024-02-02 ENCOUNTER — Encounter: Payer: Self-pay | Admitting: Physician Assistant

## 2024-02-02 VITALS — BP 87/45 | HR 52 | Resp 14 | Ht 62.0 in | Wt 205.0 lb

## 2024-02-02 DIAGNOSIS — N390 Urinary tract infection, site not specified: Secondary | ICD-10-CM

## 2024-02-02 DIAGNOSIS — I959 Hypotension, unspecified: Secondary | ICD-10-CM

## 2024-02-02 DIAGNOSIS — E785 Hyperlipidemia, unspecified: Secondary | ICD-10-CM

## 2024-02-02 DIAGNOSIS — I2510 Atherosclerotic heart disease of native coronary artery without angina pectoris: Secondary | ICD-10-CM

## 2024-02-02 DIAGNOSIS — D649 Anemia, unspecified: Secondary | ICD-10-CM

## 2024-02-02 NOTE — Progress Notes (Signed)
 urine

## 2024-02-02 NOTE — Patient Instructions (Signed)
 Medication Instructions:  NO CHANGES *If you need a refill on your cardiac medications before your next appointment, please call your pharmacy*  Lab Work: [facility] AND URINALYSIS WITH CULTURE If you have labs (blood work) drawn today and your tests are completely normal, you will receive your results only by: MyChart Message (if you have MyChart) OR A paper copy in the mail If you have any lab test that is abnormal or we need to change your treatment, we will call you to review the results.  Testing/Procedures: NO TESTING  Follow-Up: At [facility], you and your health needs are our priority.  As part of our continuing mission to provide you with exceptional heart care, our providers are all part of one team.  This team includes your primary Cardiologist (physician) and Advanced Practice Providers or APPs (Physician Assistants and Nurse Practitioners) who all work together to provide you with the care you need, when you need it.  Your next appointment:   2 month(s)  Provider:   Gayatri A Acharya, MD or Hao Meng, [place]

## 2024-02-02 NOTE — Progress Notes (Signed)
 Cardiology Office Note   Date:  02/02/2024  ID:  Valerie [name], DOB 1960/12/02, MRN [id number] PCP: Vevelyn Gowers, NP  [facility] Providers Cardiologist:  Euell Herrlich, MD     History of Present Illness Valerie [name] is a 63 y.o. female with PMH of nonobstructive CAD on cath 10/2019, HLD, prediabetes, neuropathy, history of CVA, bipolar, GAD, seizure, CKD and OSA on CPAP.  Patient was admitted in the hospital in March 2021 with chest pain and found to have elevated troponin.  Subsequent cardiac catheterization demonstrated nonobstructive CAD, LVEDP 25 mmHg.  Potential etiology behind chest pain included hypertensive heart disease, stress-induced cardiomyopathy or small vessel disease.  Echocardiogram obtained during the hospitalization showed EF of 60 to 65%, normal EF, no regional wall motion abnormality.  More recently, patient was seen by Katlyn West NP on 10/15/2023 for preop clearance.  She was able to achieve more than 4 METS of activity, therefore was felt to be acceptable risk to proceed with upcoming surgery.  He underwent a right total knee surgery on 10/19/2023 by Dr. Agatha Horsfall, shortly after discharge, patient was readmitted to the hospital after a fall and the development of confusion.  She was diagnosed with acute cystitis.  Urine culture was positive for E. coli ESBL.  Patient was treated with Rocephin  and fosfomycin.  Patient was readmitted to the hospital on 01/02/2024 after developed a right leg pain secondary to a fall.  According to hospital record, [place] in [place] Mother[place] when she tripped over a curb and fell directly on her right knee.  X-ray of the right knee and femur showed acute displaced and overriding distal femur fracture.  She was seen by orthopedic surgery and underwent ORIF of the right supracondylar distal femur fracture surgery by Dr. Curtiss Dowdy on 01/03/2024.  She was in the hospital for more than a week.  Postop course complicated by acute  anemia and hypotension.  Antihypertensives were held.  Midodrine  was added.  She was discharged on 2.5 mg twice a day of Eliquis  for VTE prophylaxis.   I last saw the patient on 01/19/2024, her initial blood pressure was 73/44 at the time, on manual repeat by myself, blood pressure came up to 86/54.  I increased her midodrine  to 10 mg 3 times a day as needed for systolic blood pressure less than 100 mmHg.  I recommended echocardiogram and a repeat urine culture.  Urinalysis was positive for nitrite and many bacteria.  We sent in a prescription for Bactrim  DS 800/160 mg twice a day for 7 days to [facility].  Patient presents today for follow-up.  She was discharged from [facility] on Saturday and currently staying at home.  She is getting home rehab.  She denies any chest pain or shortness of breath.  Last blood work obtained at [facility] showed hemoglobin of 8.5.  I will repeat CBC, basic metabolic panel and urinalysis with culture today.  I discussed her case with DOD, interestingly, her blood pressure has been in the 130s at home for the past few days and only dip is this morning.  She looked great on exam and was alert and oriented.  Suspicion for severe sepsis is fairly low, we will hold off on blood culture at this time.  She will need to follow-up with her PCP regarding persistent hypotension.  I plan to see the patient back in 2 months for reassessment.  If white blood cell count  is high on the lab work, I would have low threshold of ordering blood culture   ROS:   She denies chest pain, palpitations, dyspnea, pnd, orthopnea, n, v, dizziness, syncope, edema, weight gain, or early satiety. All other systems reviewed and are otherwise negative except as noted above.    Studies Reviewed      Cardiac Studies & Procedures   ______________________________________________________________________________________________ CARDIAC CATHETERIZATION  CARDIAC  CATHETERIZATION 11/14/2019  Conclusion  Prox LAD to Mid LAD lesion is 30% stenosed.  Prox RCA lesion is 25% stenosed.  LV end diastolic pressure is moderately elevated.  1. Mild nonobstructive CAD. 2. Moderately elevated LV EDP  Plan: medical management. Assess LV function by Echo. Potential etiology for chest pain include hypertensive heart disease, stress induced CM, or aortic pathology.  Findings Coronary Findings Diagnostic  Dominance: Right  Left Main Vessel was injected. Vessel is large. Vessel is angiographically normal.  Left Anterior Descending Prox LAD to Mid LAD lesion is 30% stenosed.  Left Circumflex Vessel was injected. Vessel is large. Vessel is angiographically normal. The vessel is moderately ectatic.  Right Coronary Artery Vessel was injected. Vessel is large. Vessel is angiographically normal. The vessel is moderately ectatic. Prox RCA lesion is 25% stenosed.  Intervention  No interventions have been documented.     ECHOCARDIOGRAM  ECHOCARDIOGRAM COMPLETE 11/14/2019  Narrative ECHOCARDIOGRAM REPORT    Patient Name:   Valerie [name] Date of Exam: 11/14/2019 Medical Rec #:  [id number]      Height:       62.0 in Accession #:    [id number]     Weight:       224.7 lb Date of Birth:  1961/04/10       BSA:          2.009 m Patient Age:    58 years       BP:           120/56 mmHg Patient Gender: F              HR:           50 bpm. Exam Location:  Inpatient  Procedure: 2D Echo, Cardiac Doppler and Color Doppler  Indications:    Chest Pain 786.50  History:        Patient has no prior history of Echocardiogram examinations. Signs/Symptoms:Chest Pain; Risk Factors:Current Smoker.  Sonographer:    Joannie Muff RDCS Referring Phys: [id number] [facility]  IMPRESSIONS   1. Left ventricular ejection fraction, by estimation, is 60 to 65%. The left ventricle has normal function. The left ventricle has no regional wall motion abnormalities. Left  ventricular diastolic parameters were normal. 2. Right ventricular systolic function is normal. The right ventricular size is normal. Tricuspid regurgitation signal is inadequate for assessing PA pressure. 3. The mitral valve is grossly normal. No evidence of mitral valve regurgitation. No evidence of mitral stenosis. 4. The aortic valve is tricuspid. Aortic valve regurgitation is not visualized. No aortic stenosis is present. 5. The inferior vena cava is normal in size with greater than 50% respiratory variability, suggesting right atrial pressure of 3 mmHg.  FINDINGS Left Ventricle: Left ventricular ejection fraction, by estimation, is 60 to 65%. The left ventricle has normal function. The left ventricle has no regional wall motion abnormalities. The left ventricular internal cavity size was normal in size. There is no left ventricular hypertrophy. Left ventricular diastolic parameters were normal. Normal left ventricular filling pressure.  Right Ventricle: The right ventricular size is normal. No  increase in right ventricular wall thickness. Right ventricular systolic function is normal. Tricuspid regurgitation signal is inadequate for assessing PA pressure.  Left Atrium: Left atrial size was normal in size.  Right Atrium: Right atrial size was normal in size. Prominent Crista terminalis.  Pericardium: There is no evidence of pericardial effusion. Presence of pericardial fat pad.  Mitral Valve: The mitral valve is grossly normal. No evidence of mitral valve regurgitation. No evidence of mitral valve stenosis.  Tricuspid Valve: The tricuspid valve is grossly normal. Tricuspid valve regurgitation is not demonstrated. No evidence of tricuspid stenosis.  Aortic Valve: The aortic valve is tricuspid. Aortic valve regurgitation is not visualized. No aortic stenosis is present.  Pulmonic Valve: The pulmonic valve was grossly normal. Pulmonic valve regurgitation is not visualized. No evidence of  pulmonic stenosis.  Aorta: The aortic root is normal in size and structure.  Venous: The inferior vena cava is normal in size with greater than 50% respiratory variability, suggesting right atrial pressure of 3 mmHg.  IAS/Shunts: No atrial level shunt detected by color flow Doppler.   LEFT VENTRICLE PLAX 2D LVIDd:         4.60 cm      Diastology LVIDs:         3.20 cm      LV e' lateral:   7.83 cm/s LV PW:         0.80 cm      LV E/e' lateral: 11.0 LV IVS:        0.80 cm      LV e' medial:    6.64 cm/s LVOT diam:     2.00 cm      LV E/e' medial:  13.0 LV SV:         74 LV SV Index:   37 LVOT Area:     3.14 cm  LV Volumes (MOD) LV vol d, MOD A2C: 111.0 ml LV vol d, MOD A4C: 123.0 ml LV vol s, MOD A2C: 50.6 ml LV vol s, MOD A4C: 46.3 ml LV SV MOD A2C:     60.4 ml LV SV MOD A4C:     123.0 ml LV SV MOD BP:      69.6 ml  RIGHT VENTRICLE RV S prime:     8.81 cm/s TAPSE (M-mode): 1.7 cm  LEFT ATRIUM             Index       RIGHT ATRIUM           Index LA diam:        3.60 cm 1.79 cm/m  RA Area:     13.70 cm LA Vol (A2C):   28.8 ml 14.34 ml/m RA Volume:   31.70 ml  15.78 ml/m LA Vol (A4C):   37.6 ml 18.72 ml/m LA Biplane Vol: 33.1 ml 16.48 ml/m AORTIC VALVE LVOT Vmax:   119.00 cm/s LVOT Vmean:  70.100 cm/s LVOT VTI:    0.235 m  AORTA Ao Root diam: 3.00 cm  MITRAL VALVE MV Area (PHT): 2.18 cm    SHUNTS MV Decel Time: 348 msec    Systemic VTI:  0.24 m MV E velocity: 86.30 cm/s  Systemic Diam: 2.00 cm MV A velocity: 97.50 cm/s MV E/A ratio:  0.89  Jackquelyn Mass MD Electronically signed by Jackquelyn Mass MD Signature Date/Time: 11/14/2019/4:11:53 PM    Final          ______________________________________________________________________________________________      Risk Assessment/Calculations  Physical Exam VS:  BP (!) 87/45 (BP Location: Left Arm, Patient Position: Sitting, Cuff Size: Large)   Pulse (!) 52   Resp 14   Ht 5' 2 (1.575  m)   Wt 205 lb (93 kg) Comment: Patient reported  SpO2 94%   BMI 37.49 kg/m    Wt Readings from Last 3 Encounters:  02/02/24 205 lb (93 kg)  01/19/24 210 lb (95.3 kg)  01/03/24 200 lb (90.7 kg)    GEN: Well nourished, well developed in no acute distress NECK: No JVD; No carotid bruits CARDIAC: RRR, no murmurs, rubs, gallops RESPIRATORY:  Clear to auscultation without rales, wheezing or rhonchi  ABDOMEN: Soft, non-tender, non-distended EXTREMITIES:  No edema; No deformity   ASSESSMENT AND PLAN  Hypotension: Currently on midodrine  10 mg 3 times a day as needed.  According to the patient, despite her low blood pressure today, her systolic blood pressure has been in the 130s at home in the past few days to the point where she has not needed to take the midodrine .  I previously treated her for UTI with 7-day course of Bactrim .  Will obtain urinalysis with culture today.  She has a history of E. coli ESBL UTI.  Obtain CBC.  If white blood cell count is high, may need blood culture.  Anemia: Labwork showed hemoglobin 8.5.  Obtain CBC  UTI: Obtain urinalysis  CAD: Prior history of nonobstructive CAD.  Denies any chest pain  Hyperlipidemia: On rosuvastatin   Recent orthopedic surgery: On Eliquis  2.5 mg twice a day for 30 days for DVT prophylaxis.       Dispo: Follow-up in 2 months  Signed, Daiki Dicostanzo, [place]

## 2024-02-03 ENCOUNTER — Ambulatory Visit: Payer: Self-pay | Admitting: Physician Assistant

## 2024-02-03 LAB — CBC WITH DIFFERENTIAL/PLATELET
Basophils Absolute: 0 x10E3/uL (ref 0.0–0.2)
Basos: 1 %
EOS (ABSOLUTE): 0.3 x10E3/uL (ref 0.0–0.4)
Eos: 4 %
Hematocrit: 34.3 % (ref 34.0–46.6)
Hemoglobin: 10.7 g/dL — ABNORMAL LOW (ref 11.1–15.9)
Immature Grans (Abs): 0.1 x10E3/uL (ref 0.0–0.1)
Immature Granulocytes: 1 %
Lymphocytes Absolute: 0.8 x10E3/uL (ref 0.7–3.1)
Lymphs: 11 %
MCH: 29.6 pg (ref 26.6–33.0)
MCHC: 31.2 g/dL — ABNORMAL LOW (ref 31.5–35.7)
MCV: 95 fL (ref 79–97)
Monocytes Absolute: 0.7 x10E3/uL (ref 0.1–0.9)
Monocytes: 9 %
Neutrophils Absolute: 5.5 x10E3/uL (ref 1.4–7.0)
Neutrophils: 74 %
Platelets: 256 x10E3/uL (ref 150–450)
RBC: 3.62 x10E6/uL — ABNORMAL LOW (ref 3.77–5.28)
RDW: 13.8 % (ref 11.7–15.4)
WBC: 7.4 x10E3/uL (ref 3.4–10.8)

## 2024-02-03 LAB — BASIC METABOLIC PANEL WITH GFR
BUN/Creatinine Ratio: 21 (ref 12–28)
BUN: 32 mg/dL — ABNORMAL HIGH (ref 8–27)
CO2: 24 mmol/L (ref 20–29)
Calcium: 9.1 mg/dL (ref 8.7–10.3)
Chloride: 100 mmol/L (ref 96–106)
Creatinine, Ser: 1.51 mg/dL — ABNORMAL HIGH (ref 0.57–1.00)
Glucose: 92 mg/dL (ref 70–99)
Potassium: 4.5 mmol/L (ref 3.5–5.2)
Sodium: 141 mmol/L (ref 134–144)
eGFR: 38 mL/min/{1.73_m2} — ABNORMAL LOW (ref >59)

## 2024-02-03 NOTE — Progress Notes (Signed)
 Stable renal function and electrolyte. Red blood cell count improving. Normal white blood cell count which is reassuring.

## 2024-02-04 ENCOUNTER — Ambulatory Visit: Payer: Self-pay | Admitting: Physician Assistant

## 2024-02-04 ENCOUNTER — Ambulatory Visit
Admission: RE | Admit: 2024-02-04 | Discharge: 2024-02-04 | Disposition: A | Discharge status: Home / Self Care | Source: Ambulatory Visit | Attending: Specialist | Admitting: Specialist

## 2024-02-04 DIAGNOSIS — Z452 Encounter for adjustment and management of vascular access device: Secondary | ICD-10-CM

## 2024-02-04 HISTORY — PX: IR IMAGING GUIDED PORT INSERTION: IMG5740

## 2024-02-04 MED ORDER — SODIUM CHLORIDE 0.9 % IV SOLN
INTRAVENOUS | Status: DC
  Administered 2024-02-04: 250 mL via INTRAVENOUS

## 2024-02-04 MED ORDER — MIDAZOLAM HCL 2 MG/2ML IJ SOLN: 1.0000 mg | INTRAMUSCULAR | Status: DC | PRN

## 2024-02-04 MED ORDER — HEPARIN SOD (PORK) LOCK FLUSH 100 UNIT/ML IV SOLN
500.0000 [IU] | Freq: Once | INTRAVENOUS | Status: AC
  Administered 2024-02-04: 500 [IU]

## 2024-02-04 MED ORDER — FENTANYL CITRATE PF 50 MCG/ML IJ SOSY: 25.0000 ug | PREFILLED_SYRINGE | INTRAMUSCULAR | Status: DC | PRN

## 2024-02-04 MED ORDER — LIDOCAINE-EPINEPHRINE 1 %-1:100000 IJ SOLN
20.0000 mL | Freq: Once | INTRAMUSCULAR | Status: AC
  Administered 2024-02-04: 20 mL via INTRADERMAL

## 2024-02-04 MED ORDER — MIDAZOLAM HCL 2 MG/2ML IJ SOLN
INTRAMUSCULAR | Status: DC | PRN
  Administered 2024-02-04: 1 mg via INTRAVENOUS

## 2024-02-04 NOTE — Procedures (Signed)
 Interventional Radiology Procedure Note  Procedure: Single Lumen Power Port Placement    Access:  Right internal jugular vein  Findings: Catheter tip positioned at cavoatrial junction. Port is ready for immediate use.   Complications: None  EBL: < 10 mL  Recommendations:  - Ok to shower in 24 hours - Do not submerge for 7 days - Routine line care    Marliss Coots, MD

## 2024-02-04 NOTE — Progress Notes (Signed)
 Preliminary culture came back positive for e coli, awaiting final sensitivity panel to see which antibiotic is the bacteria sensitive to.

## 2024-02-04 NOTE — H&P (Signed)
 Patient Status: [facility]-- outpatient  Assessment and Plan: Patient in need of venous access.  Patient in need of Leqembi infusions.  Request for durable venous access by Dr. Maxie Spaniel.  Valerie [name] presents today in her usual state of health.  She did have a light breakfast this AM, however is agreeable to [place] with anxiolytic.  She does have transportation and post-procedure care with family.   Risks and benefits of image guided port-a-catheter placement was discussed with the patient including, but not limited to bleeding, infection, pneumothorax, or fibrin sheath development and need for additional procedures.  All of the patient's questions were answered, patient is agreeable to proceed. Consent signed and in chart.  ______________________________________________________________________   History of Present Illness: Valerie [name] is a 63 y.o. female with medical history significant for anxiety/depression, NSTEMI in 2021, diabetes, CKD stage IIIB, peripheral neuropathy, bipolar disorder, hypothyroidism, psoriatic arthritis, CVA, dementia, OSA and a recent right total knee replacement in February 2025 by Dr. Agatha Horsfall.  She is now planning for Laqembi infusions and is in need of durable venous access.  Allergies and medications reviewed.   Review of Systems: A 12 point ROS discussed and pertinent positives are indicated in the HPI above.  All other systems are negative.  Review of Systems  Constitutional:  Negative for fatigue and fever.  Respiratory:  Negative for cough and shortness of breath.   Cardiovascular:  Negative for chest pain.  Gastrointestinal:  Negative for abdominal pain, nausea and vomiting.  Musculoskeletal:  Negative for back pain.  Psychiatric/Behavioral:  Negative for behavioral problems and confusion.     Vital Signs: BP 120/60   Pulse 68   Temp 98.2 F (36.8 C)   Resp 16   SpO2 96%   Physical Exam Vitals and nursing note reviewed.  Constitutional:       General: She is not in acute distress.    Appearance: Normal appearance. She is not ill-appearing.  HENT:     Mouth/Throat:     Mouth: Mucous membranes are moist.     Pharynx: Oropharynx is clear.   Cardiovascular:     Rate and Rhythm: Normal rate and regular rhythm.  Pulmonary:     Effort: Pulmonary effort is normal.     Breath sounds: Normal breath sounds.  Abdominal:     General: Abdomen is flat. There is no distension.     Palpations: Abdomen is soft.   Neurological:     General: No focal deficit present.     Mental Status: She is alert and oriented to person, place, and time. Mental status is at baseline.   Psychiatric:        Mood and Affect: Mood normal.        Behavior: Behavior normal.        Thought Content: Thought content normal.        Judgment: Judgment normal.      Imaging reviewed.   Labs:  COAGS: No results for input(s): INR, APTT in the last 8760 hours.  BMP: Recent Labs    01/07/24 0328 01/08/24 0436 01/09/24 0921 01/10/24 0720 02/02/24 1134  NA 141 138 137  --  141  K 4.6 4.4 3.7  --  4.5  CL 110 107 104  --  100  CO2 24 25 24   --  24  GLUCOSE 90 96 145*  --  92  BUN 17 21 19   --  32*  CALCIUM  8.1* 8.6* 8.9  --  9.1  CREATININE 1.24*  1.29* 1.48* 1.45* 1.51*  GFRNONAA 49* 46* 39* 40*  --        Electronically Signed: Lillian Rein, PA 02/04/2024, 8:14 AM   I spent a total of 15 minutes in face to face in clinical consultation, greater than 50% of which was counseling/coordinating care for venous access.

## 2024-02-07 ENCOUNTER — Other Ambulatory Visit: Payer: Self-pay | Admitting: Physician Assistant

## 2024-02-07 ENCOUNTER — Telehealth: Payer: Self-pay

## 2024-02-07 MED ORDER — AMOXICILLIN-POT CLAVULANATE 875-125 MG PO TABS: 1.0000 | ORAL_TABLET | Freq: Two times a day (BID) | ORAL | 0 refills | Status: DC

## 2024-02-07 MED ORDER — AMOXICILLIN-POT CLAVULANATE 875-125 MG PO TABS
1.0000 | ORAL_TABLET | Freq: Two times a day (BID) | ORAL | 0 refills | Status: DC
Start: 2024-02-07 — End: 2024-02-07

## 2024-02-07 NOTE — Telephone Encounter (Signed)
 Phone call to patient to follow up from her portacath insertion 02/04/24. She reports her port site is still sore but fine. She states she's using it for the first time tomorrow.  I asked her not to use any EMLA cream while the dermabond still is there and the wound is healing; she said she hasn't been prescribed any yet. She said she would bring this up with her health care team tomorrow. She denies any signs of infection, redness at the site, draining or fever.

## 2024-02-07 NOTE — Progress Notes (Signed)
 Sensitivity panel came back, I spoke with our clinical pharmacist, we ultimately decided on Augmentin  twice a day for 7 days for her complicated UTI. I have sent the Rx to her [facility] pharmacy. Please inform the patient that the side effect of Augmentin  is diarrhea, if she has significant side effect, please let us  know. Also she need a follow up with her PCP given recurrent complicated UTI. Will defer to PCP to draw repeat urinalysis with culture to monitor in the future. Please send a copy of her lab to her PCP as well. She need to monitor more recurrent infection since she is at risk for sepsis, her previous sepsis symptom is altered mental status.

## 2024-02-08 NOTE — Progress Notes (Deleted)
 Office Visit Note  [name]             Date of Birth: 03/28/1961           MRN: [id number]             PCP: Vevelyn Gowers, NP Referring: Vevelyn Gowers, NP Visit Date: 02/22/2024   Subjective:  No chief complaint on file.   History of Present Illness: Sukari Grist is a 63 y.o. female here for follow up with joint pain in multiple areas concern for psoriatic arthritis.    Previous HPI 06/01/2023 Havanna Groner is a 63 y.o. female here for follow up with joint pain in multiple areas concern for psoriatic arthritis.  Evaluation at our initial visit was overall unremarkable CRP just slightly outside normal at 8.7 x-rays without any erosive disease.  She continues to have joint pain in multiple areas including her back and legs and hands.  Bone density scan from last month is consistent with osteopenia with T-score -1.7.   Previous HPI 05/12/23 Swayzie Choate is a 63 y.o. female here for evaluation and treatment for psoriatic arthritis.  Previous diagnosis and treatment with her internist in [place] with previous treatments including hydroxychloroquine , methotrexate , Enbrel, and long-term prednisone .  She states originally evaluated with reportedly borderline labs for RA check due to hand swelling but was ultimately diagnosed with psoriatic disease.  She does not recall exact sequence of treatments but remembers drugs were stopped after sepsis event in 2020 that required long-term antibiotics and stopping her DMARD treatments.  She has had bilateral total hip arthroplasty.  Currently treated with prednisone  5 mg daily she has been on higher doses up to 20 mg daily in the past. But recently has issues with persistent pain more around the shoulders hips and knees affecting both sides.  Intermittently sees swelling in the hands and feet.  On the scalp and occurring on the right index finger.  Not on any specific topical medication prescriptions for this.     Labs  reviewed 04/2023 eGFR 38.41 03/2023 Vit D 60.11   No Rheumatology ROS completed.   PMFS History:  Patient Active Problem List   Diagnosis Date Noted   Acute postoperative anemia due to expected blood loss 01/04/2024   Periprosthetic fracture around internal prosthetic right knee joint 01/02/2024   Primary localized osteoarthritis of right knee 10/19/2023   Seizure disorder (HCC) 07/28/2023   Parkinson disease (HCC) 07/28/2023   Aortic atherosclerosis (HCC) 07/28/2023   Primary osteoarthritis involving multiple joints 06/20/2023   Long term (current) use of systemic steroids 06/20/2023   High risk medication use 05/12/2023   Vitamin D  deficiency 05/12/2023   Tremor 02/11/2023   Fall 02/11/2023   Candidal skin infection 12/04/2022   Urinary incontinence 12/04/2022   GERD (gastroesophageal reflux disease) 10/22/2022   Hyperlipidemia associated with type 2 diabetes mellitus (HCC) 10/22/2022   Hyperparathyroidism (HCC) 12/03/2020   Multiple thyroid  nodules 12/03/2020   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   Acute kidney injury superimposed on stage 3b chronic kidney disease (HCC)    Anemia, chronic disease 07/18/2020   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 07/18/2020   CAD (coronary artery disease) 06/04/2020   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 05/20/2020   DM type 2 (diabetes mellitus, type 2) (HCC) 11/14/2019   Anxiety    Obesity, Class II, BMI 35-39.9    Chronic kidney disease, stage 3b (HCC) - baseline Scr 1.4  Psoriatic arthritis (HCC)    Tobacco abuse    OSA (obstructive sleep apnea)    Neuropathy    Bipolar disorder (HCC)    Osteoarthritis of right hip 11/05/2014   Essential hypertension 10/29/2014    Past Medical History:  Diagnosis Date   Anemia    Anxiety    Arthritis    Psoriatic   Bipolar disorder (HCC)    CKD (chronic kidney disease), stage III (HCC)    Colovesical fistula     Dementia (HCC)    Depression    DM type 2 (diabetes mellitus, type 2) (HCC) 11/14/2019   Dyspnea    E coli bacteremia 07/28/2023   Headache    Hyperthyroidism    Neuromuscular disorder (HCC)    Neuropathy    NSTEMI (non-ST elevated myocardial infarction) (HCC) 11/14/2019   OSA (obstructive sleep apnea)    Primary localized osteoarthritis of right knee 10/19/2023   Psoriatic arthritis (HCC)    Pyelonephritis 07/23/2023   Sleep apnea    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   Stroke (HCC)    ?history; MRI normal    Family History  Problem Relation Age of Onset   Breast cancer Neg Hx    Past Surgical History:  Procedure Laterality Date   ABDOMINAL HYSTERECTOMY     APPENDECTOMY     CHOLECYSTECTOMY     CYSTOSCOPY WITH STENT PLACEMENT N/A 07/17/2020   Procedure: CYSTOSCOPY WITH BILATERAL FIREFLY INJECTION;  Surgeon: Florencio Hunting, MD;  Location: [facility];  Service: Urology;  Laterality: N/A;   FLEXIBLE SIGMOIDOSCOPY N/A 05/17/2020   Procedure: FLEXIBLE SIGMOIDOSCOPY;  Surgeon: Alvis Jourdain, MD;  Location: [facility];  Service: Endoscopy;  Laterality: N/A;   FOOT SURGERY Right    IR FLUORO GUIDE CV LINE RIGHT  08/19/2020   IR IMAGING GUIDED PORT INSERTION  02/04/2024   IR RADIOLOGIST EVAL & MGMT  09/10/2020   IR RADIOLOGIST EVAL & MGMT  10/10/2020   IR REMOVAL TUN CV CATH W/O FL  09/16/2020   IR SINUS/FIST TUBE CHK-NON GI  10/24/2020   IR SINUS/FIST TUBE CHK-NON GI  10/31/2020   IR US  GUIDE VASC ACCESS RIGHT  08/19/2020   LEFT HEART CATH AND CORONARY ANGIOGRAPHY N/A 11/14/2019   Procedure: LEFT HEART CATH AND CORONARY ANGIOGRAPHY;  Surgeon: Swaziland, Peter M, MD;  Location: [facility] INVASIVE CV LAB;  Service: Cardiovascular;  Laterality: N/A;   ORIF PERIPROSTHETIC FRACTURE Right 01/03/2024   Procedure: OPEN REDUCTION INTERNAL FIXATION (ORIF) PERIPROSTHETIC FRACTURE;  Surgeon: Laneta Pintos, MD;  Location: [facility] OR;  Service: Orthopedics;  Laterality: Right;   PARATHYROID  EXPLORATION N/A  01/09/2021   Procedure: POSSIBLE NECK EXPLORATION;  Surgeon: Oralee Billow, MD;  Location: [facility];  Service: General;  Laterality: N/A;   PARATHYROIDECTOMY Left 01/09/2021   Procedure: LEFT INFERIOR PARATHYROIDECTOMY;  Surgeon: Oralee Billow, MD;  Location: [facility];  Service: General;  Laterality: Left;   PROCTOSCOPY N/A 07/17/2020   Procedure: RIGID PROCTOSCOPY;  Surgeon: Candyce Champagne, MD;  Location: [facility];  Service: General;  Laterality: N/A;   TONSILLECTOMY     TOTAL HIP ARTHROPLASTY Bilateral    TOTAL KNEE ARTHROPLASTY Right 10/19/2023   Procedure: TOTAL KNEE ARTHROPLASTY;  Surgeon: Osa Blase, MD;  Location: [facility];  Service: Orthopedics;  Laterality: Right;   Social History   Social History Narrative   Not on file   Immunization History  Administered Date(s) Administered   Influenza, Seasonal, Injecte, Preservative Fre 05/03/2023   Influenza,inj,Quad PF,6+ Mos 06/30/2017, 08/12/2022  Objective: Vital Signs: There were no vitals taken for this visit.   Physical Exam   Musculoskeletal Exam: ***  CDAI Exam: CDAI Score: -- Patient Global: --; Provider Global: -- Swollen: --; Tender: -- Joint Exam 02/22/2024   No joint exam has been documented for this visit   There is currently no information documented on the homunculus. Go to the Rheumatology activity and complete the homunculus joint exam.  Investigation: No additional findings.  Imaging: IR IMAGING GUIDED PORT INSERTION Result Date: 02/04/2024 INDICATION: 63 year old female requiring central venous access for Leqembi infusions. EXAM: IMPLANTED PORT A CATH PLACEMENT WITH ULTRASOUND AND FLUOROSCOPIC GUIDANCE COMPARISON:  None Available. MEDICATIONS: None. ANESTHESIA/SEDATION: Moderate (conscious) sedation was employed during this procedure. A total of Versed  2 mg and Fentanyl  0 mcg was administered intravenously. Moderate Sedation Time: 0 minutes. The patient's level of consciousness and vital signs were monitored  continuously by radiology nursing throughout the procedure under my direct supervision. CONTRAST:  None FLUOROSCOPY TIME:  1.9 mGy reference air kerma COMPLICATIONS: None immediate. PROCEDURE: The procedure, risks, benefits, and alternatives were explained to the patient. Questions regarding the procedure were encouraged and answered. The patient understands and consents to the procedure. The right neck and chest were prepped with chlorhexidine  in a sterile fashion, and a sterile drape was applied covering the operative field. Maximum barrier sterile technique with sterile gowns and gloves were used for the procedure. A timeout was performed prior to the initiation of the procedure. Ultrasound was used to examine the jugular vein which was compressible and free of internal echoes. A skin marker was used to demarcate the planned venotomy and port pocket incision sites. Local anesthesia was provided to these sites and the subcutaneous tunnel track with 1% lidocaine  with 1:100,000 epinephrine . A small incision was created at the jugular access site and blunt dissection was performed of the subcutaneous tissues. Under ultrasound guidance, the jugular vein was accessed with a 21 ga micropuncture needle and an 0.018 wire was inserted to the superior vena cava. Real-time ultrasound guidance was utilized for vascular access including the acquisition of a permanent ultrasound image documenting patency of the accessed vessel. A 5 Fr micopuncture set was then used, through which a 0.035 Rosen wire was passed under fluoroscopic guidance into the inferior vena cava. An 8 Fr dilator was then placed over the wire. A subcutaneous port pocket was then created along the upper chest wall utilizing a combination of sharp and blunt dissection. The pocket was irrigated with sterile saline, packed with gauze, and observed for hemorrhage. A single lumen plastic power injectable port was chosen for placement. The 8 Fr catheter was  tunneled from the port pocket site to the venotomy incision. The port was placed in the pocket. The external catheter was trimmed to appropriate length. The dilator was exchanged for an 8 Fr peel-away sheath under fluoroscopic guidance. The catheter was then placed through the sheath and the sheath was removed. Final catheter positioning was confirmed and documented with a fluoroscopic spot radiograph. The port was accessed with a Huber needle, aspirated, and flushed with heparinized saline. The deep dermal layer of the port pocket incision was closed with interrupted 3-0 Vicryl suture. Dermabond was then placed over the port pocket and neck incisions. The patient tolerated the procedure well without immediate post procedural complication. FINDINGS: After catheter placement, the tip lies within the superior cavoatrial junction. The catheter aspirates and flushes normally and is ready for immediate use. IMPRESSION: Successful placement of a power injectable Port-A-Cath  via the right internal jugular vein. The catheter is ready for immediate use. Creasie Doctor, MD [facility] Radiology Electronically Signed   By: Creasie Doctor M.D.   On: 02/04/2024 09:08    Recent Labs: Lab Results  Component Value Date   WBC 7.4 02/02/2024   HGB 10.7 (L) 02/02/2024   PLT 256 02/02/2024   NA 141 02/02/2024   K 4.5 02/02/2024   CL 100 02/02/2024   CO2 24 02/02/2024   GLUCOSE 92 02/02/2024   BUN 32 (H) 02/02/2024   CREATININE 1.51 (H) 02/02/2024   BILITOT 0.6 01/09/2024   ALKPHOS 66 01/09/2024   AST 22 01/09/2024   ALT 5 01/09/2024   PROT 5.6 (L) 01/09/2024   ALBUMIN  3.2 (L) 01/09/2024   CALCIUM  9.1 02/02/2024   GFRAA 38 (L) 05/15/2020   QFTBGOLDPLUS NEGATIVE 05/12/2023    Speciality Comments: No specialty comments available.  Procedures:  No procedures performed Allergies: Carbamazepine and Sertraline hcl   Assessment / Plan:     Visit Diagnoses: No  diagnosis found.  ***  Orders: No orders of the defined types were placed in this encounter.  No orders of the defined types were placed in this encounter.    Follow-Up Instructions: No follow-ups on file.   Glena Landau, RT  Note - This record has been created using [employer].  Chart creation errors have been sought, but may not always  have been located. Such creation errors do not reflect on  the standard of medical care.

## 2024-02-22 ENCOUNTER — Ambulatory Visit: Admitting: Internal Medicine

## 2024-02-22 DIAGNOSIS — Z7952 Long term (current) use of systemic steroids: Secondary | ICD-10-CM

## 2024-02-22 DIAGNOSIS — L4050 Arthropathic psoriasis, unspecified: Secondary | ICD-10-CM

## 2024-02-22 DIAGNOSIS — M150 Primary generalized (osteo)arthritis: Secondary | ICD-10-CM

## 2024-03-01 ENCOUNTER — Ambulatory Visit (HOSPITAL_COMMUNITY)
Admission: RE | Admit: 2024-03-01 | Discharge: 2024-03-01 | Disposition: A | Discharge status: Home / Self Care | Source: Ambulatory Visit | Attending: Cardiovascular Disease | Admitting: Cardiovascular Disease

## 2024-03-01 DIAGNOSIS — I959 Hypotension, unspecified: Secondary | ICD-10-CM | POA: Insufficient documentation

## 2024-03-01 DIAGNOSIS — I2510 Atherosclerotic heart disease of native coronary artery without angina pectoris: Secondary | ICD-10-CM | POA: Diagnosis present

## 2024-03-01 LAB — ECHOCARDIOGRAM COMPLETE
Area-P 1/2: 3.43 cm2
S' Lateral: 2.8 cm

## 2024-03-14 ENCOUNTER — Encounter (HOSPITAL_COMMUNITY): Payer: Self-pay | Admitting: Student

## 2024-03-15 ENCOUNTER — Other Ambulatory Visit (HOSPITAL_COMMUNITY): Payer: Self-pay | Admitting: Student

## 2024-03-15 DIAGNOSIS — S72451D Displaced supracondylar fracture without intracondylar extension of lower end of right femur, subsequent encounter for closed fracture with routine healing: Secondary | ICD-10-CM

## 2024-03-22 ENCOUNTER — Ambulatory Visit (HOSPITAL_COMMUNITY)
Admission: RE | Admit: 2024-03-22 | Discharge: 2024-03-22 | Disposition: A | Discharge status: Home / Self Care | Source: Ambulatory Visit | Attending: Student | Admitting: Student

## 2024-03-22 DIAGNOSIS — S72451D Displaced supracondylar fracture without intracondylar extension of lower end of right femur, subsequent encounter for closed fracture with routine healing: Secondary | ICD-10-CM | POA: Diagnosis present

## 2024-04-01 ENCOUNTER — Other Ambulatory Visit: Payer: Self-pay | Admitting: Family Medicine

## 2024-04-03 ENCOUNTER — Ambulatory Visit: Attending: Physician Assistant | Admitting: Physician Assistant

## 2024-04-03 ENCOUNTER — Encounter: Payer: Self-pay | Admitting: Physician Assistant

## 2024-04-03 VITALS — BP 106/68 | HR 61 | Ht 62.0 in | Wt 205.0 lb

## 2024-04-03 DIAGNOSIS — I2510 Atherosclerotic heart disease of native coronary artery without angina pectoris: Secondary | ICD-10-CM | POA: Diagnosis not present

## 2024-04-03 DIAGNOSIS — I959 Hypotension, unspecified: Secondary | ICD-10-CM

## 2024-04-03 DIAGNOSIS — E785 Hyperlipidemia, unspecified: Secondary | ICD-10-CM | POA: Diagnosis not present

## 2024-04-03 NOTE — Patient Instructions (Signed)
 Medication Instructions:  Your physician recommends that you continue on your current medications as directed. Please refer to the Current Medication list given to you today.  *If you need a refill on your cardiac medications before your next appointment, please call your pharmacy*  Lab Work: None ordered If you have labs (blood work) drawn today and your tests are completely normal, you will receive your results only by: MyChart Message (if you have MyChart) OR A paper copy in the mail If you have any lab test that is abnormal or we need to change your treatment, we will call you to review the results.  Follow-Up: At [facility], you and your health needs are our priority.  As part of our continuing mission to provide you with exceptional heart care, our providers are all part of one team.  This team includes your primary Cardiologist (physician) and Advanced Practice Providers or APPs (Physician Assistants and Nurse Practitioners) who all work together to provide you with the care you need, when you need it.  Your next appointment:   1 year(s)  Provider:   Gayatri A Acharya, MD

## 2024-04-03 NOTE — Progress Notes (Signed)
 Cardiology Office Note   Date:  04/03/2024  ID:  Denetria Luevanos, DOB 1960-12-08, MRN [id number] PCP: Arloa Jarvis, NP  [facility] Providers Cardiologist:  Soyla DELENA Merck, MD { Click to update primary MD,subspecialty MD or APP then REFRESH:1}    History of Present Illness Valerie [name] is a 63 y.o. female with PMH of nonobstructive CAD on cath 10/2019, HLD, prediabetes, neuropathy, history of CVA, bipolar, GAD, seizure, CKD and OSA on CPAP. Patient was admitted in the hospital in March 2021 with chest pain and found to have elevated troponin. Subsequent cardiac catheterization demonstrated nonobstructive CAD, LVEDP 25 mmHg. Potential etiology behind chest pain included hypertensive heart disease, stress-induced cardiomyopathy or small vessel disease. Echocardiogram obtained during the hospitalization showed EF of 60 to 65%, normal EF, no regional wall motion abnormality. More recently, patient was seen by Katlyn West NP on 10/15/2023 for preop clearance. She was able to achieve more than 4 METS of activity, therefore was felt to be acceptable risk to proceed with upcoming surgery. He underwent a right total knee surgery on 10/19/2023 by Dr. Josefina, shortly after discharge, patient was readmitted to the hospital after a fall and the development of confusion. She was diagnosed with acute cystitis. Urine culture was positive for E. coli ESBL. Patient was treated with Rocephin  and fosfomycin. Patient was readmitted to the hospital on 01/02/2024 after developed a right leg pain secondary to a fall. According to hospital record, patient was out in downtown eating for Mother's Day when she tripped over a curb and fell directly on her right knee. X-ray of the right knee and femur showed acute displaced and overriding distal femur fracture. She was seen by orthopedic surgery and underwent ORIF of the right supracondylar distal femur fracture surgery by Dr. Kendal on 01/03/2024. She was in the hospital for  more than a week. Postop course complicated by acute anemia and hypotension. Antihypertensives were held. Midodrine  was added. She was discharged on 2.5 mg twice a day of Eliquis  for VTE prophylaxis.   I first saw the patient in May 2025, blood pressure was in the 70s.  I increased her midodrine  to 10 mg 3 times a day.  Her urinalysis was positive for nitrite and many bacteria.  We initially prescribed Bactrim .  I last saw the patient in June 2025 at which time she was still hypotensive in the 80s.  However according to her, blood pressure was in the 130s.  She looked much better during physical exam.  I have obtained another urine culture and prescribed Augmentin  for 7 days for complicated UTI.  Echocardiogram obtained on 03/01/2024 showed EF 65 to 70%, no regional wall motion abnormality, trivial MR.   Presents today for follow-up.  She looks very good and denies any recent chest pain or shortness of breath.  She is off of Eliquis  and the midodrine .  Her blood pressure stable at 106/68.  She denies any significant dizziness.  She still cannot walk long distance because the knee issue.  Recent echocardiogram was very reassuring.  Overall patient is doing well and can follow-up with Dr. Merck in 1 year  ROS: ***  Studies Reviewed      *** Risk Assessment/Calculations {Does this patient have ATRIAL FIBRILLATION?:[id number]}         Physical Exam VS:  BP 106/68   Pulse 61   Ht 5' 2 (1.575 m)   Wt 205 lb (93 kg)   SpO2 95%   BMI 37.49 kg/m  Wt Readings from Last 3 Encounters:  04/03/24 205 lb (93 kg)  02/02/24 205 lb (93 kg)  01/19/24 210 lb (95.3 kg)    GEN: Well nourished, well developed in no acute distress NECK: No JVD; No carotid bruits CARDIAC: ***RRR, no murmurs, rubs, gallops RESPIRATORY:  Clear to auscultation without rales, wheezing or rhonchi  ABDOMEN: Soft, non-tender, non-distended EXTREMITIES:  No edema; No deformity   ASSESSMENT AND PLAN ***    {Are you  ordering a CV Procedure (e.g. stress test, cath, DCCV, TEE, etc)?   Press F2        :[id number]}  Dispo: ***  Signed, Scot Ford, PA

## 2024-04-18 ENCOUNTER — Encounter: Payer: Self-pay | Admitting: Internal Medicine

## 2024-04-18 ENCOUNTER — Ambulatory Visit: Payer: Medicare Other | Admitting: Internal Medicine

## 2024-04-18 VITALS — BP 120/70 | HR 55 | Ht 62.0 in | Wt 210.0 lb

## 2024-04-18 DIAGNOSIS — E042 Nontoxic multinodular goiter: Secondary | ICD-10-CM

## 2024-04-18 DIAGNOSIS — E213 Hyperparathyroidism, unspecified: Secondary | ICD-10-CM | POA: Diagnosis not present

## 2024-04-18 NOTE — Progress Notes (Signed)
 Patient ID: Valerie [name], female   DOB: May 16, 1961, 63 y.o.   MRN: [id number]    HPI  Valerie [name] is a 63 y.o.-year-old female, initially referred by Dr. Jerri, returning for follow-up for primary hyperparathyroidism and thyroid  nodules.  Last visit 1 year ago  Interim history: Patient continues to have  joint pains, muscle aches. She has psoriatic arthritis >> prev. On  Enbrel, MTX, Prednisone  (now 5 mg daily) now also back on Embrel - helping.  She also has dementia-started Aricept  and Namenda  in 2023. She had a fall 06/2023 but no fractures.  However, she had a right  femoral fracture after a fall on 01/02/2024 - had ORIF 01/12/2024.  Since last visit she also had a right total knee replacement 10/19/2023. She has Home Health PT/OT.  Reviewed history: At our first visit, patient presented after hospital admission for metabolic encephalopathy in the setting of hypercalcemia after having colovesical fistula repair in 06/2020. She has another fistula >> will have repair Sx in 09/2019. Before her first Sx, she was on TPN for 6 weeks.  Calcium  levels were elevated in 10/2019 when she had an AMI and a stroke.  During her recent admission, calcium  level was found to be as high as 12.8.  The PTH was not suppressed, at 73.   While in the hospital, she was given Zolendronic acid x1, calcitonin, and hydrated and her calcium  returned to normal and then decreased to even the lower limit of normal.  Of note, calcitriol level, vitamin D , TSH, ACE, PTHrp, 24-hour urine calcium  and spot Ca/Cr ratio were all normal.  Phosphorus was low (lowest 1.0).   I reviewed pt's pertinent labs: Lab Results  Component Value Date   PTH 47 04/16/2022   PTH 84 (H) 04/15/2021   PTH Comment 04/15/2021   PTH 113 (H) 12/03/2020   PTH Comment 12/03/2020   PTH 73 (H) 08/09/2020   PTH Comment 08/09/2020   CALCIUM  9.1 02/02/2024   CALCIUM  8.9 01/09/2024   CALCIUM  8.6 (L) 01/08/2024   CALCIUM  8.1 (L) 01/07/2024   CALCIUM  8.1  (L) 01/06/2024   CALCIUM  7.9 (L) 01/04/2024   CALCIUM  7.8 (L) 01/03/2024   CALCIUM  8.6 (L) 01/02/2024   CALCIUM  8.1 (L) 10/22/2023   CALCIUM  8.9 10/21/2023   CALCIUM  8.6 (L) 10/11/2023   CALCIUM  8.9 07/28/2023   CALCIUM  8.7 (L) 07/27/2023   CALCIUM  7.9 (L) 07/25/2023   CALCIUM  7.6 (L) 07/24/2023   CALCIUM  8.3 (L) 07/23/2023   CALCIUM  9.4 05/03/2023   CALCIUM  9.3 02/10/2023   CALCIUM  8.5 (L) 08/23/2022   CALCIUM  8.5 (L) 04/15/2021   CALCIUM  9.4 01/07/2021   CALCIUM  10.0 12/03/2020   CALCIUM  7.9 (L) 08/19/2020   CALCIUM  7.9 (L) 08/18/2020   CALCIUM  8.2 (L) 08/16/2020   CALCIUM  8.3 (L) 08/15/2020   CALCIUM  8.9 08/14/2020   CALCIUM  9.5 08/13/2020   CALCIUM  10.3 08/12/2020   CALCIUM  11.7 (H) 08/11/2020   CALCIUM  11.9 (H) 08/10/2020   CALCIUM  12.0 (H) 08/09/2020   CALCIUM  12.8 (H) 08/09/2020   CALCIUM  11.4 (H) 07/21/2020   CALCIUM  11.4 (H) 07/20/2020   CALCIUM  11.7 (H) 07/19/2020   CALCIUM  11.6 (H) 07/19/2020   CALCIUM  10.7 (H) 07/18/2020   CALCIUM  11.3 (H) 07/17/2020   CALCIUM  10.1 07/08/2020   CALCIUM  9.3 06/13/2020   CALCIUM  9.8 06/10/2020   CALCIUM  10.1 06/09/2020   CALCIUM  10.4 (H) 06/08/2020   CALCIUM  10.7 (H) 06/07/2020   CALCIUM  10.5 (H) 06/06/2020   CALCIUM  10.8 (H) 06/05/2020  CALCIUM  11.5 (H) 06/03/2020   CALCIUM  11.2 (H) 05/15/2020   CALCIUM  12.9 (H) 05/14/2020  02/04/2021: Calcium  9.0, PTH 72  Lab Results  Component Value Date   PHOS 3.5 01/09/2024   PHOS 3.1 01/08/2024   PHOS 2.4 (L) 01/07/2024   PHOS 2.3 (L) 01/06/2024   PHOS 3.9 07/28/2023   PHOS 2.6 07/27/2023   PHOS 3.5 07/24/2023   PHOS 3.0 08/18/2020   PHOS 2.2 (L) 08/16/2020   PHOS 1.9 (L) 08/15/2020   Component     Latest Ref Rng & Units 08/16/2020  Magnesium      1.7 - 2.4 mg/dL 2.3   Component     Latest Ref Rng & Units 08/09/2020 08/10/2020 08/11/2020 08/14/2020  Calcium , Ur     Not Estab. mg/dL  6.0 6.2   Calcium /Creat.Ratio     29 - 442 mg/g creat  154    Creatinine, Urine      Not Estab. mg/dL  60.9    Calcium , 24 hour urine     0 - 320 mg/24 hr   143   Vit D, 1,25-Dihydroxy     19.9 - 79.3 pg/mL 33.2     PTH-related peptide     pmol/L   <2.0   Angiotensin-Converting Enzyme     14 - 82 U/L    41   FE Ca = 0.02  A thyroid  ultrasound (01/05/2020) showed 4 nodules, but none of them reported as possible parathyroid  adenomas: THYROID  ULTRASOUND  INDICATION: Nontoxic single thyroid  nodule thyroid  nodules seen on carotid ultrasound performed at doctor's office  COMPARISON: None  ISTHMUS:  - Size: 0.4 cm.   RIGHT LOBE:  - Size: 3.6 x 1.2 x 1.3 cm.  - Echogenicity: Normal.   LEFT LOBE:  - Size: 3.9 x 2.4 x 2.7 cm.  - Echogenicity: Normal.   NODULES:   - Nodule 1:  -- Size: 0.7 x 0.6 x 0.7 cm (long x AP x trans).  -- Location: Right mid.  -- Composition: spongiform (0)  -- Echogenicity: hypoechoic (2)  -- Shape: wider-than-tall (0)  -- Margins: ill-defined (0)  -- Echogenic foci: none (0)  -- ACR TI-RADS total points and risk category: 2 Points - TR2.   - Nodule 2:  -- Size: 2.4 x 1.6 x 2 cm (long x AP x trans).  -- Location: Left mid.  -- Composition: solid or almost completely solid (2)  -- Echogenicity: hypoechoic (2)  -- Shape: wider-than-tall (0)  -- Margins: ill-defined (0)  -- Echogenic foci: none (0)  -- ACR TI-RADS total points and risk category: 4 Points - TR4.   03/29/2020: FNA: Thyroid , Left Mid Lobe; FNA (smears, ThinPrep): Findings are consistent with a benign hyperplastic thyroid  nodule with Hurthle cell changes and cystic degeneration. [facility] System Category II  - Nodule 3:  -- Size: 0.6 x 0.4 x 0.6 cm (long x AP x trans).  -- Location: Left lower.  -- Composition: cystic or almost completely cystic (0)   - Nodule 4:  -- Size: 0.8 x 0.6 x 0.7 cm (long x AP x trans).  -- Location: Left lower.  -- Composition: solid or almost completely solid (2)  -- Echogenicity: isoechoic (1)  -- Shape: wider-than-tall (0)  --  Margins: smooth (0)  -- Echogenic foci: none (0)  -- ACR TI-RADS total points and risk category: 3 Points - TR3.   Technetium sestamibi scan (11/23/2020): Washout of radiotracer activity from the thyroid  gland at 2 hours. Small focus faint activity inferior to  the LEFT lobe of thyroid  gland is noted on planar imaging.   SPECT imaging of the neck and chest demonstrate a small focus activity which is faintly above the residual thyroid  activit localizing to the lower pole of the LEFT lobe of thyroid  gland.   Inferior to the LEFT lower pole activity, there is a second small focus of activity which is very faint.   IMPRESSION: 1. No convincing evidence parathyroid  adenoma adenoma. 2. Focal activity in the lower pole of the LEFT lobe of thyroid  gland and small focus inferior to LEFT lobe of thyroid  gland would be most likely location of parathyroid  adenomas. Consider CT of the neck with contrast for further evaluation.  4DCT (12/17/2020):  Possible left inferior parathyroid  adenoma, measuring 9 mm  Left inferior parathyroidectomy (01/09/2021): final pathology: 0.465 mg hypercellular parathyroid  tissue consistent with adenoma (1.0 x 0.8 x 0.7 cm)  Thyroid  U/S (04/17/2021): Parenchymal Echotexture: Moderately heterogeneous Isthmus: 0.4 cm Right lobe: 3.9 x 1.2 x 1.9 cm Left lobe: 3.6 x 1.2 x 1.7 cm _________________________________________________________   Nodule 1: 0.7 cm spongiform nodule in the right mid thyroid  lobe does not meet criteria for FNA or surveillance.  _________________________________________________________   Nodule # 2: Location: Left; mid Maximum size: 1.3 cm; Other 2 dimensions: 1.1 x 0.8 cm Composition: solid/almost completely solid (2) Echogenicity: isoechoic (1) Given size (<1.4 cm) and appearance, this nodule does NOT meet TI-RADS criteria for biopsy or dedicated follow-up.  _________________________________________________________   Nodule #  3: Location: Left; inferior Maximum size: 1.0 cm; Other 2 dimensions: 0.9 x 0.5 cm Composition: solid/almost completely solid (2) Echogenicity: hypoechoic (2)  *Given size (>/= 1 - 1.4 cm) and appearance, a follow-up ultrasound in 1 year should be considered based on TI-RADS criteria.  ________________________________________________________   IMPRESSION: Nodule 3 (TI-RADS 4) located in the inferior left thyroid  lobe meets criteria for imaging follow-up. Annual ultrasound surveillance is recommended until 5 years of stability is documented.  Thyroid  U/S (04/27/2023): Parenchymal Echotexture: Moderately heterogenous  Isthmus: 0.5 cm  Right lobe: 3.7 cm x 1.0 cm x 1.4 cm  Left lobe: 3.9 cm x 1.0 cm x 1.4 cm  _________________________________________________________   Estimated total number of nodules >/= 1 cm: 1  _________________________________________________________   Nodule labeled 1 in the mid right thyroid , 7 mm with spongiform characteristics. Nodule does not meet criteria for surveillance.   Nodule labeled 2 in the mid left thyroid , 1.2 cm. Nodule has TR 2/cystic characteristics and does not meet criteria for surveillance.   Nodule labeled 3, inferior left thyroid , 8 mm. Nodule has decreased in size, TR 4, and does not meet the threshold for continued surveillance. Discontinuing surveillance may be reasonable.   No adenopathy   IMPRESSION: Heterogeneous thyroid  suggesting medical thyroid  disease.   Nodule labeled 3 inferior left thyroid , TR 4, has decreased in size and is now below threshold for surveillance. Discontinuing surveillance may be reasonable.  Not on B complex.  No previous DXA scan reports available for review. No h/o OP.  No fractures or falls.   + h/o kidney stone x1 years ago.  +  CKD. Last BUN/Cr: Lab Results  Component Value Date   BUN 32 (H) 02/02/2024   BUN 19 01/09/2024   CREATININE 1.51 (H) 02/02/2024   CREATININE 1.45 (H)  01/10/2024  Pt is not on HCTZ.  No h/o vitamin D  deficiency. Reviewed vit D levels: Lab Results  Component Value Date   VD25OH 52.28 01/03/2024   VD25OH 60.11 04/19/2023  VD25OH 41.30 04/16/2022   VD25OH 50.6 12/03/2020   VD25OH 68.85 08/09/2020   Pt was on vitamin D  1000 units daily >> now on Centrum silver MVI (1000 Units)  Pt does not have a FH of hypercalcemia, pituitary tumors, thyroid  cancer, or osteoporosis.   Pt. also has a history of CAD, history of NSTEMI.  She has a history of scurvy.  Also has a history of peripheral neuropathy, psoriatic arthritis, prediabetes, hyperlipidemia. She is on B12. 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  Tried Lyrica and Neurontin  but this did not work well for her in the past. She gets steroid injections.  She also has leg weakness. She is on Keppra  for seizures.  ROS: + see HPI  Past Medical History:  Diagnosis Date   Anemia    Anxiety    Arthritis    Psoriatic   Bipolar disorder (HCC)    CKD (chronic kidney disease), stage III (HCC)    Colovesical fistula    Dementia (HCC)    Depression    DM type 2 (diabetes mellitus, type 2) (HCC) 11/14/2019   Dyspnea    E coli bacteremia 07/28/2023   Headache    Hyperthyroidism    Neuromuscular disorder (HCC)    Neuropathy    NSTEMI (non-ST elevated myocardial infarction) (HCC) 11/14/2019   OSA (obstructive sleep apnea)    Primary localized osteoarthritis of right knee 10/19/2023   Psoriatic arthritis (HCC)    Pyelonephritis 07/23/2023   Sleep apnea    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   Stroke (HCC)    ?history; MRI normal   Past Surgical History:  Procedure Laterality Date   ABDOMINAL HYSTERECTOMY     APPENDECTOMY     CHOLECYSTECTOMY     CYSTOSCOPY WITH STENT PLACEMENT N/A 07/17/2020   Procedure: CYSTOSCOPY WITH BILATERAL FIREFLY INJECTION;  Surgeon: Renda Glance, MD;  Location: [facility];  Service: Urology;  Laterality: N/A;   FLEXIBLE SIGMOIDOSCOPY N/A 05/17/2020    Procedure: FLEXIBLE SIGMOIDOSCOPY;  Surgeon: Rollin Dover, MD;  Location: [facility];  Service: Endoscopy;  Laterality: N/A;   FOOT SURGERY Right    IR FLUORO GUIDE CV LINE RIGHT  08/19/2020   IR IMAGING GUIDED PORT INSERTION  02/04/2024   IR RADIOLOGIST EVAL & MGMT  09/10/2020   IR RADIOLOGIST EVAL & MGMT  10/10/2020   IR REMOVAL TUN CV CATH W/O FL  09/16/2020   IR SINUS/FIST TUBE CHK-NON GI  10/24/2020   IR SINUS/FIST TUBE CHK-NON GI  10/31/2020   IR US  GUIDE VASC ACCESS RIGHT  08/19/2020   LEFT HEART CATH AND CORONARY ANGIOGRAPHY N/A 11/14/2019   Procedure: LEFT HEART CATH AND CORONARY ANGIOGRAPHY;  Surgeon: [place], Peter M, MD;  Location: [facility] INVASIVE CV LAB;  Service: Cardiovascular;  Laterality: N/A;   ORIF PERIPROSTHETIC FRACTURE Right 01/03/2024   Procedure: OPEN REDUCTION INTERNAL FIXATION (ORIF) PERIPROSTHETIC FRACTURE;  Surgeon: Kendal Franky SQUIBB, MD;  Location: [facility];  Service: Orthopedics;  Laterality: Right;   PARATHYROID  EXPLORATION N/A 01/09/2021   Procedure: POSSIBLE NECK EXPLORATION;  Surgeon: Eletha Boas, MD;  Location: [facility];  Service: General;  Laterality: N/A;   PARATHYROIDECTOMY Left 01/09/2021   Procedure: LEFT INFERIOR PARATHYROIDECTOMY;  Surgeon: Eletha Boas, MD;  Location: [facility];  Service: General;  Laterality: Left;   PROCTOSCOPY N/A 07/17/2020   Procedure: RIGID PROCTOSCOPY;  Surgeon: Sheldon Standing, MD;  Location: [facility];  Service: General;  Laterality: N/A;   TONSILLECTOMY     TOTAL HIP ARTHROPLASTY Bilateral  TOTAL KNEE ARTHROPLASTY Right 10/19/2023   Procedure: TOTAL KNEE ARTHROPLASTY;  Surgeon: Josefina Chew, MD;  Location: [facility];  Service: Orthopedics;  Laterality: Right;   Social History   Socioeconomic History   Marital status: Divorced    Spouse name: Not on file   Number of children: 2   Years of education: Not on file   Highest education level: Associate degree: occupational, Scientist, product/process development, or vocational program  Occupational History   Occupation:  Disabled  Tobacco Use   Smoking status: Former    Current packs/day: 0.50    Types: Cigarettes, E-cigarettes    Passive exposure: Past   Smokeless tobacco: Never  Vaping Use   Vaping status: Every Day  Substance and Sexual Activity   Alcohol use: Yes    Comment: rarely   Drug use: Never   Sexual activity: Not on file  Other Topics Concern   Not on file  Social History Narrative   Not on file   Social Drivers of Health   Financial Resource Strain: High Risk (01/11/2023)   Overall Financial Resource Strain (CARDIA)    Difficulty of Paying Living Expenses: Very hard  Food Insecurity: No Food Insecurity (01/02/2024)   Hunger Vital Sign    Worried About Running Out of Food in the Last Year: Never true    Ran Out of Food in the Last Year: Never true  Transportation Needs: No Transportation Needs (01/02/2024)   PRAPARE - Administrator, Civil Service (Medical): No    Lack of Transportation (Non-Medical): No  Physical Activity: Inactive (09/21/2023)   Exercise Vital Sign    Days of Exercise per Week: 0 days    Minutes of Exercise per Session: 0 min  Stress: Stress Concern Present (09/21/2023)   [employer] of Occupational Health - Occupational Stress Questionnaire    Feeling of Stress : To some extent  Social Connections: Socially Isolated (10/21/2023)   Social Connection and Isolation Panel    Frequency of Communication with Friends and Family: Once a week    Frequency of Social Gatherings with Friends and Family: Once a week    Attends Religious Services: Never    Database administrator or Organizations: No    Attends Banker Meetings: Never    Marital Status: Divorced  Catering manager Violence: Not At Risk (01/02/2024)   Humiliation, Afraid, Rape, and Kick questionnaire    Fear of Current or Ex-Partner: No    Emotionally Abused: No    Physically Abused: No    Sexually Abused: No   Current Outpatient Medications on File Prior to Visit   Medication Sig Dispense Refill   aspirin  EC 81 MG tablet Take 81 mg by mouth daily. Swallow whole.     carbidopa -levodopa  (SINEMET  IR) 25-100 MG tablet Take 1 tablet by mouth in the morning and at bedtime.     desvenlafaxine (PRISTIQ) 25 MG 24 hr tablet Take 25 mg by mouth daily.     Docusate Calcium  (STOOL SOFTENER PO) Take 1 tablet by mouth in the morning and at bedtime.     donepezil  (ARICEPT ) 10 MG tablet Take 10 mg by mouth daily.     ENBREL 50 MG/ML injection Inject 50 mg into the skin See admin instructions. Take 50 mg 2 times a week for 4 weeks take 50 mg once  a week     fluconazole  (DIFLUCAN ) 150 MG tablet Take 150 mg by mouth every 3 (three) days.     gabapentin  (NEURONTIN ) 300  MG capsule Take 300-600 mg by mouth See admin instructions. Take 300 mg in the morning and 600 mg at night     lamoTRIgine  (LAMICTAL ) 150 MG tablet Take 1 tablet by mouth 2 (two) times daily.     Lecanemab-irmb (LEQEMBI IV) Inject into the vein every 14 (fourteen) days.     levETIRAcetam  (KEPPRA ) 750 MG tablet Take 750 mg by mouth 2 (two) times daily.     levocetirizine (XYZAL ) 5 MG tablet Take 1 tablet (5 mg total) by mouth every evening. 90 tablet 3   meclizine  (ANTIVERT ) 25 MG tablet Take 25 mg by mouth 3 (three) times daily as needed for dizziness.     memantine  (NAMENDA ) 10 MG tablet Take 10 mg by mouth 2 (two) times daily.     Multiple Vitamins-Minerals (MULTIVITAMIN WITH MINERALS) tablet Take 1 tablet by mouth at bedtime.     nitroGLYCERIN  (NITROSTAT ) 0.4 MG SL tablet Place 1 tablet (0.4 mg total) under the tongue every 5 (five) minutes as needed for chest pain. 10 tablet 2   Omega-3 Fatty Acids (FISH OIL PO) Take 2 capsules by mouth daily.     omeprazole  (PRILOSEC) 40 MG capsule Take 1 capsule (40 mg total) by mouth daily. 90 capsule 3   ondansetron  (ZOFRAN ) 8 MG tablet Take 1 tablet (8 mg total) by mouth every 8 (eight) hours as needed for nausea or vomiting. 20 tablet 0   ondansetron  (ZOFRAN -ODT) 8  MG disintegrating tablet Take 8 mg by mouth every 8 (eight) hours as needed for nausea or vomiting.     oxybutynin  (DITROPAN ) 5 MG tablet Take 5 mg by mouth 2 (two) times daily.     polyethylene glycol (MIRALAX  / GLYCOLAX ) 17 g packet Take 17 g by mouth 2 (two) times daily.     predniSONE  (DELTASONE ) 5 MG tablet Take 5 mg by mouth daily.     primidone  (MYSOLINE ) 50 MG tablet Take 100 mg by mouth 2 (two) times daily.     rizatriptan  (MAXALT ) 10 MG tablet Take 10 mg by mouth as needed for migraine. May repeat in 2 hours if needed     rosuvastatin  (CRESTOR ) 10 MG tablet Take 1 tablet (10 mg total) by mouth daily. 90 tablet 3   sennosides-docusate sodium  (SENOKOT-S) 8.6-50 MG tablet Take 2 tablets by mouth daily. 30 tablet 1   tiZANidine  (ZANAFLEX ) 4 MG tablet Take 1 tablet (4 mg total) by mouth 3 (three) times daily. 60 tablet 0   traZODone  (DESYREL ) 100 MG tablet Take 2 tablets (200 mg total) by mouth at bedtime. 60 tablet 0   No current facility-administered medications on file prior to visit.   Allergies  Allergen Reactions   Carbamazepine Other (See Comments)    Parkinsons like symptoms, tremors   Sertraline Hcl Other (See Comments)    Parkinsons like symptoms   Family History  Problem Relation Age of Onset   Breast cancer Neg Hx    PE: BP 120/70   Pulse (!) 55   Ht 5' 2 (1.575 m)   Wt 210 lb (95.3 kg)   SpO2 95%   BMI 38.41 kg/m  Wt Readings from Last 3 Encounters:  04/18/24 210 lb (95.3 kg)  04/03/24 205 lb (93 kg)  02/02/24 205 lb (93 kg)   Constitutional: overweight, in NAD. No kyphosis. Walks with a cane. Eyes: EOMI, no exophthalmos ENT: no thyromegaly, no nodules palpated, no cervical lymphadenopathy Cardiovascular: RRR, No MRG  Respiratory: CTA B Musculoskeletal: no deformities, R knee  in brace Skin: no rashes, + bruises on forearms Neurological: + tremor with outstretched hands  Assessment: 1.  Primary hyperparathyroidism  2. Thyroid   nodules  Plan: Patient with several instances of elevated calcium  in the past, with the highest being 12.8-12.9.  A corresponding intact PTH level was also high, at 72.  She has a history of vitamin D  deficiency but more recent levels were normal.  Investigation pointed towards primary hyperparathyroidism (fractional excretion of calcium  0.02, normal calcitriol level, PTH-rp, ACE, SPEP/UPEP, magnesium , phosphorus.  She had CKD but this was mild and not severe enough to explain her significant the elevated calcium .  I referred her to surgery and she had the technician sestamibi scan in 11/2020 which did not show a clear parathyroid  lesion.  However, there was the possibility of a left inferior parathyroid  adenoma.  She then had a 4 DCT which also showed a possible left inferior parathyroid  adenoma measuring 9 mm.  She ended up having left inferior parathyroidectomy on 01/09/2021.  Final pathology showed a 0.465 g parathyroid  adenoma, measuring 1.2 cm in the largest dimension. - After the surgery, PTH was still elevated but calcium  was low at the time of the measurement so this was most likely a physiologic, and not pathologic response to removal of the parathyroid  gland. - Ionized and total calcium  levels were normal afterwards, including at last check on 02/02/2024 (9.1) - Vitamin D  level was normal at last check in 01/03/2024 (52.28) - she can continue with calcium  and vitamin D  level checked by PCP at the annual physical exam  2. Thyroid  nodules -Patient with a history of 3 subcentimeter and 1 super centimeter thyroid  nodule, which the largest was biopsied with benign results in 2021 - We checked another thyroid  ultrasound in 2022 and this showed only 1 nodule that required follow-up.  This was hypoechoic and measuring 1 cm in the largest dimension.hypoechoic, measuring 1 cm in the largest dimension - At last visit, we checked another thyroid  ultrasound (04/27/2023) and this showed heterogeneous thyroid   with a left inferior thyroid  nodule which has decreased in size and was below the threshold for surveillance.  No imaging follow-up was recommended. - Denies neck compression symptoms - TSH was normal at last check in 01/2023 - Will have her follow-up with her primary care doctor and return to see me as needed.  Lela Fendt, MD PhD [facility] Endocrinology

## 2024-04-18 NOTE — Patient Instructions (Addendum)
 Please continue vitamin D  1000 units daily.    Please return to see me as needed.

## 2024-05-29 ENCOUNTER — Encounter (HOSPITAL_COMMUNITY): Payer: Self-pay

## 2024-05-29 ENCOUNTER — Emergency Department (HOSPITAL_COMMUNITY)

## 2024-05-29 ENCOUNTER — Other Ambulatory Visit: Payer: Self-pay

## 2024-05-29 ENCOUNTER — Inpatient Hospital Stay (HOSPITAL_COMMUNITY)
Admission: EM | Admit: 2024-05-29 | Discharge: 2024-06-05 | DRG: 872 | Disposition: A | Discharge status: Home / Self Care | Attending: Family Medicine | Admitting: Family Medicine

## 2024-05-29 DIAGNOSIS — F02818 Dementia in other diseases classified elsewhere, unspecified severity, with other behavioral disturbance: Secondary | ICD-10-CM | POA: Diagnosis present

## 2024-05-29 DIAGNOSIS — F410 Panic disorder [episodic paroxysmal anxiety] without agoraphobia: Secondary | ICD-10-CM | POA: Diagnosis present

## 2024-05-29 DIAGNOSIS — E1122 Type 2 diabetes mellitus with diabetic chronic kidney disease: Secondary | ICD-10-CM | POA: Diagnosis present

## 2024-05-29 DIAGNOSIS — E872 Acidosis, unspecified: Secondary | ICD-10-CM | POA: Diagnosis present

## 2024-05-29 DIAGNOSIS — G20A1 Parkinson's disease without dyskinesia, without mention of fluctuations: Secondary | ICD-10-CM | POA: Diagnosis present

## 2024-05-29 DIAGNOSIS — E8809 Other disorders of plasma-protein metabolism, not elsewhere classified: Secondary | ICD-10-CM | POA: Diagnosis present

## 2024-05-29 DIAGNOSIS — I2510 Atherosclerotic heart disease of native coronary artery without angina pectoris: Secondary | ICD-10-CM | POA: Diagnosis present

## 2024-05-29 DIAGNOSIS — B9620 Unspecified Escherichia coli [E. coli] as the cause of diseases classified elsewhere: Secondary | ICD-10-CM | POA: Diagnosis not present

## 2024-05-29 DIAGNOSIS — I129 Hypertensive chronic kidney disease with stage 1 through stage 4 chronic kidney disease, or unspecified chronic kidney disease: Secondary | ICD-10-CM | POA: Diagnosis present

## 2024-05-29 DIAGNOSIS — N179 Acute kidney failure, unspecified: Secondary | ICD-10-CM | POA: Diagnosis present

## 2024-05-29 DIAGNOSIS — R6520 Severe sepsis without septic shock: Secondary | ICD-10-CM | POA: Diagnosis present

## 2024-05-29 DIAGNOSIS — Z96651 Presence of right artificial knee joint: Secondary | ICD-10-CM | POA: Diagnosis present

## 2024-05-29 DIAGNOSIS — A4151 Sepsis due to Escherichia coli [E. coli]: Secondary | ICD-10-CM | POA: Diagnosis present

## 2024-05-29 DIAGNOSIS — F319 Bipolar disorder, unspecified: Secondary | ICD-10-CM | POA: Diagnosis present

## 2024-05-29 DIAGNOSIS — D649 Anemia, unspecified: Secondary | ICD-10-CM | POA: Diagnosis present

## 2024-05-29 DIAGNOSIS — G4700 Insomnia, unspecified: Secondary | ICD-10-CM | POA: Diagnosis present

## 2024-05-29 DIAGNOSIS — Z7982 Long term (current) use of aspirin: Secondary | ICD-10-CM

## 2024-05-29 DIAGNOSIS — Y92009 Unspecified place in unspecified non-institutional (private) residence as the place of occurrence of the external cause: Secondary | ICD-10-CM

## 2024-05-29 DIAGNOSIS — F0284 Dementia in other diseases classified elsewhere, unspecified severity, with anxiety: Secondary | ICD-10-CM | POA: Diagnosis present

## 2024-05-29 DIAGNOSIS — N390 Urinary tract infection, site not specified: Secondary | ICD-10-CM | POA: Diagnosis present

## 2024-05-29 DIAGNOSIS — E039 Hypothyroidism, unspecified: Secondary | ICD-10-CM | POA: Diagnosis present

## 2024-05-29 DIAGNOSIS — W19XXXA Unspecified fall, initial encounter: Secondary | ICD-10-CM | POA: Diagnosis not present

## 2024-05-29 DIAGNOSIS — E785 Hyperlipidemia, unspecified: Secondary | ICD-10-CM | POA: Diagnosis present

## 2024-05-29 DIAGNOSIS — Z9181 History of falling: Secondary | ICD-10-CM

## 2024-05-29 DIAGNOSIS — G4733 Obstructive sleep apnea (adult) (pediatric): Secondary | ICD-10-CM | POA: Diagnosis present

## 2024-05-29 DIAGNOSIS — Z87891 Personal history of nicotine dependence: Secondary | ICD-10-CM

## 2024-05-29 DIAGNOSIS — G40909 Epilepsy, unspecified, not intractable, without status epilepticus: Secondary | ICD-10-CM | POA: Diagnosis present

## 2024-05-29 DIAGNOSIS — F0283 Dementia in other diseases classified elsewhere, unspecified severity, with mood disturbance: Secondary | ICD-10-CM | POA: Diagnosis present

## 2024-05-29 DIAGNOSIS — E871 Hypo-osmolality and hyponatremia: Secondary | ICD-10-CM | POA: Diagnosis present

## 2024-05-29 DIAGNOSIS — L4050 Arthropathic psoriasis, unspecified: Secondary | ICD-10-CM | POA: Diagnosis present

## 2024-05-29 DIAGNOSIS — G309 Alzheimer's disease, unspecified: Secondary | ICD-10-CM | POA: Diagnosis present

## 2024-05-29 DIAGNOSIS — R531 Weakness: Secondary | ICD-10-CM | POA: Diagnosis not present

## 2024-05-29 DIAGNOSIS — R7881 Bacteremia: Secondary | ICD-10-CM | POA: Diagnosis not present

## 2024-05-29 DIAGNOSIS — A419 Sepsis, unspecified organism: Principal | ICD-10-CM | POA: Diagnosis present

## 2024-05-29 DIAGNOSIS — G709 Myoneural disorder, unspecified: Secondary | ICD-10-CM | POA: Diagnosis present

## 2024-05-29 DIAGNOSIS — Z888 Allergy status to other drugs, medicaments and biological substances status: Secondary | ICD-10-CM

## 2024-05-29 DIAGNOSIS — R296 Repeated falls: Secondary | ICD-10-CM | POA: Diagnosis present

## 2024-05-29 DIAGNOSIS — E66812 Obesity, class 2: Secondary | ICD-10-CM | POA: Diagnosis present

## 2024-05-29 DIAGNOSIS — Z8673 Personal history of transient ischemic attack (TIA), and cerebral infarction without residual deficits: Secondary | ICD-10-CM

## 2024-05-29 DIAGNOSIS — N1832 Chronic kidney disease, stage 3b: Secondary | ICD-10-CM | POA: Diagnosis present

## 2024-05-29 DIAGNOSIS — I252 Old myocardial infarction: Secondary | ICD-10-CM

## 2024-05-29 DIAGNOSIS — Z751 Person awaiting admission to adequate facility elsewhere: Secondary | ICD-10-CM

## 2024-05-29 DIAGNOSIS — F411 Generalized anxiety disorder: Secondary | ICD-10-CM | POA: Diagnosis present

## 2024-05-29 DIAGNOSIS — K219 Gastro-esophageal reflux disease without esophagitis: Secondary | ICD-10-CM | POA: Diagnosis present

## 2024-05-29 DIAGNOSIS — Z79899 Other long term (current) drug therapy: Secondary | ICD-10-CM

## 2024-05-29 DIAGNOSIS — Z87440 Personal history of urinary (tract) infections: Secondary | ICD-10-CM

## 2024-05-29 DIAGNOSIS — Z90710 Acquired absence of both cervix and uterus: Secondary | ICD-10-CM

## 2024-05-29 DIAGNOSIS — Z6841 Body Mass Index (BMI) 40.0 and over, adult: Secondary | ICD-10-CM

## 2024-05-29 DIAGNOSIS — Z96643 Presence of artificial hip joint, bilateral: Secondary | ICD-10-CM | POA: Diagnosis present

## 2024-05-29 LAB — COMPREHENSIVE METABOLIC PANEL WITH GFR
ALT: 16 U/L (ref 0–44)
AST: 37 U/L (ref 15–41)
Albumin: 4.1 g/dL (ref 3.5–5.0)
Alkaline Phosphatase: 109 U/L (ref 38–126)
Anion gap: 13 (ref 5–15)
BUN: 38 mg/dL — ABNORMAL HIGH (ref 8–23)
CO2: 22 mmol/L (ref 22–32)
Calcium: 9.1 mg/dL (ref 8.9–10.3)
Chloride: 98 mmol/L (ref 98–111)
Creatinine, Ser: 2.07 mg/dL — ABNORMAL HIGH (ref 0.44–1.00)
GFR, Estimated: 26 mL/min — ABNORMAL LOW (ref >60)
Glucose, Bld: 136 mg/dL — ABNORMAL HIGH (ref 70–99)
Potassium: 4.4 mmol/L (ref 3.5–5.1)
Sodium: 133 mmol/L — ABNORMAL LOW (ref 135–145)
Total Bilirubin: 0.4 mg/dL (ref 0.0–1.2)
Total Protein: 7 g/dL (ref 6.5–8.1)

## 2024-05-29 LAB — URINALYSIS, W/ REFLEX TO CULTURE (INFECTION SUSPECTED)
Bilirubin Urine: NEGATIVE
Glucose, UA: NEGATIVE mg/dL
Ketones, ur: NEGATIVE mg/dL
Nitrite: POSITIVE — AB
Protein, ur: NEGATIVE mg/dL
Specific Gravity, Urine: 1.006 (ref 1.005–1.030)
WBC, UA: >50 WBC/hpf (ref 0–5)
pH: 5 (ref 5.0–8.0)

## 2024-05-29 LAB — CBC WITH DIFFERENTIAL/PLATELET
Abs Immature Granulocytes: 0.14 K/uL — ABNORMAL HIGH (ref 0.00–0.07)
Basophils Absolute: 0.1 K/uL (ref 0.0–0.1)
Basophils Relative: 0 %
Eosinophils Absolute: 0.1 K/uL (ref 0.0–0.5)
Eosinophils Relative: 0 %
HCT: 35.8 % — ABNORMAL LOW (ref 36.0–46.0)
Hemoglobin: 11.2 g/dL — ABNORMAL LOW (ref 12.0–15.0)
Immature Granulocytes: 1 %
Lymphocytes Relative: 4 %
Lymphs Abs: 0.7 K/uL (ref 0.7–4.0)
MCH: 29.2 pg (ref 26.0–34.0)
MCHC: 31.3 g/dL (ref 30.0–36.0)
MCV: 93.2 fL (ref 80.0–100.0)
Monocytes Absolute: 1.2 K/uL — ABNORMAL HIGH (ref 0.1–1.0)
Monocytes Relative: 7 %
Neutro Abs: 14.4 K/uL — ABNORMAL HIGH (ref 1.7–7.7)
Neutrophils Relative %: 88 %
Platelets: 212 K/uL (ref 150–400)
RBC: 3.84 MIL/uL — ABNORMAL LOW (ref 3.87–5.11)
RDW: 15.5 % (ref 11.5–15.5)
WBC: 16.6 K/uL — ABNORMAL HIGH (ref 4.0–10.5)
nRBC: 0 % (ref 0.0–0.2)

## 2024-05-29 LAB — PROTIME-INR
INR: 1.1 (ref 0.8–1.2)
Prothrombin Time: 15.2 s (ref 11.4–15.2)

## 2024-05-29 LAB — I-STAT CG4 LACTIC ACID, ED: Lactic Acid, Venous: 1.1 mmol/L (ref 0.5–1.9)

## 2024-05-29 MED ORDER — ENOXAPARIN SODIUM 40 MG/0.4ML IJ SOSY
40.0000 mg | PREFILLED_SYRINGE | INTRAMUSCULAR | Status: DC
  Administered 2024-05-29 – 2024-06-04 (×7): 40 mg via SUBCUTANEOUS
  Filled 2024-05-29 (×7): qty 0.4

## 2024-05-29 MED ORDER — PREDNISONE 5 MG PO TABS
5.0000 mg | ORAL_TABLET | Freq: Every day | ORAL | Status: DC
  Administered 2024-05-30 – 2024-06-05 (×7): 5 mg via ORAL
  Filled 2024-05-29 (×7): qty 1

## 2024-05-29 MED ORDER — HYDRALAZINE HCL 20 MG/ML IJ SOLN: 10.0000 mg | Freq: Four times a day (QID) | INTRAMUSCULAR | Status: DC | PRN

## 2024-05-29 MED ORDER — ONDANSETRON HCL 4 MG/2ML IJ SOLN
4.0000 mg | Freq: Four times a day (QID) | INTRAMUSCULAR | Status: DC | PRN
  Administered 2024-06-01 – 2024-06-03 (×4): 4 mg via INTRAVENOUS
  Filled 2024-05-29 (×4): qty 2

## 2024-05-29 MED ORDER — LEVALBUTEROL HCL 0.63 MG/3ML IN NEBU: 0.6300 mg | INHALATION_SOLUTION | Freq: Four times a day (QID) | RESPIRATORY_TRACT | Status: DC | PRN

## 2024-05-29 MED ORDER — ADULT MULTIVITAMIN W/MINERALS CH
1.0000 | ORAL_TABLET | Freq: Every day | ORAL | Status: DC
  Administered 2024-05-29 – 2024-06-04 (×7): 1 via ORAL
  Filled 2024-05-29 (×7): qty 1

## 2024-05-29 MED ORDER — LORATADINE 10 MG PO TABS
10.0000 mg | ORAL_TABLET | Freq: Every day | ORAL | Status: DC
  Administered 2024-05-30 – 2024-05-31 (×2): 10 mg via ORAL
  Filled 2024-05-29 (×2): qty 1

## 2024-05-29 MED ORDER — LEVOCETIRIZINE DIHYDROCHLORIDE 5 MG PO TABS
5.0000 mg | ORAL_TABLET | Freq: Every evening | ORAL | Status: DC
Start: 2024-05-29 — End: 2024-05-29

## 2024-05-29 MED ORDER — GABAPENTIN 300 MG PO CAPS
300.0000 mg | ORAL_CAPSULE | Freq: Every day | ORAL | Status: DC
Start: 2024-05-30 — End: 2024-06-05
  Administered 2024-05-30 – 2024-06-05 (×7): 300 mg via ORAL
  Filled 2024-05-29 (×7): qty 1

## 2024-05-29 MED ORDER — CARBIDOPA-LEVODOPA 25-100 MG PO TABS
1.0000 | ORAL_TABLET | Freq: Two times a day (BID) | ORAL | Status: DC
  Administered 2024-05-29 – 2024-06-05 (×14): 1 via ORAL
  Filled 2024-05-29 (×14): qty 1

## 2024-05-29 MED ORDER — ASPIRIN 81 MG PO TBEC
81.0000 mg | DELAYED_RELEASE_TABLET | Freq: Every day | ORAL | Status: DC
  Administered 2024-05-29 – 2024-06-05 (×8): 81 mg via ORAL
  Filled 2024-05-29 (×8): qty 1

## 2024-05-29 MED ORDER — LACTATED RINGERS IV BOLUS (SEPSIS)
1000.0000 mL | Freq: Once | INTRAVENOUS | Status: AC
  Administered 2024-05-29: 1000 mL via INTRAVENOUS

## 2024-05-29 MED ORDER — PANTOPRAZOLE SODIUM 40 MG PO TBEC
40.0000 mg | DELAYED_RELEASE_TABLET | Freq: Every day | ORAL | Status: DC
  Administered 2024-05-30 – 2024-06-05 (×7): 40 mg via ORAL
  Filled 2024-05-29 (×7): qty 1

## 2024-05-29 MED ORDER — MEMANTINE HCL 10 MG PO TABS
10.0000 mg | ORAL_TABLET | Freq: Two times a day (BID) | ORAL | Status: DC
  Administered 2024-05-29 – 2024-06-05 (×14): 10 mg via ORAL
  Filled 2024-05-29 (×10): qty 1
  Filled 2024-05-29: qty 2
  Filled 2024-05-29: qty 1
  Filled 2024-05-29: qty 2
  Filled 2024-05-29: qty 1

## 2024-05-29 MED ORDER — MECLIZINE HCL 25 MG PO TABS
25.0000 mg | ORAL_TABLET | Freq: Three times a day (TID) | ORAL | Status: DC | PRN
  Administered 2024-06-01: 25 mg via ORAL
  Filled 2024-05-29: qty 1

## 2024-05-29 MED ORDER — SENNA 8.6 MG PO TABS
1.0000 | ORAL_TABLET | Freq: Two times a day (BID) | ORAL | Status: DC
  Administered 2024-05-29 – 2024-06-05 (×14): 8.6 mg via ORAL
  Filled 2024-05-29 (×14): qty 1

## 2024-05-29 MED ORDER — ONDANSETRON HCL 4 MG PO TABS
4.0000 mg | ORAL_TABLET | Freq: Four times a day (QID) | ORAL | Status: DC | PRN
  Administered 2024-06-02: 4 mg via ORAL
  Filled 2024-05-29: qty 1

## 2024-05-29 MED ORDER — SODIUM CHLORIDE 0.9 % IV BOLUS
500.0000 mL | Freq: Once | INTRAVENOUS | Status: AC
  Administered 2024-05-29: 500 mL via INTRAVENOUS

## 2024-05-29 MED ORDER — ACETAMINOPHEN 325 MG PO TABS
650.0000 mg | ORAL_TABLET | Freq: Four times a day (QID) | ORAL | Status: DC | PRN
  Filled 2024-05-29 (×2): qty 2

## 2024-05-29 MED ORDER — SUMATRIPTAN SUCCINATE 50 MG PO TABS
50.0000 mg | ORAL_TABLET | ORAL | Status: DC | PRN
  Administered 2024-06-01 – 2024-06-03 (×6): 50 mg via ORAL
  Filled 2024-05-29 (×9): qty 1

## 2024-05-29 MED ORDER — FENTANYL CITRATE PF 50 MCG/ML IJ SOSY: 12.5000 ug | PREFILLED_SYRINGE | INTRAMUSCULAR | Status: DC | PRN

## 2024-05-29 MED ORDER — POLYETHYLENE GLYCOL 3350 17 G PO PACK: 17.0000 g | PACK | Freq: Every day | ORAL | Status: DC | PRN

## 2024-05-29 MED ORDER — SODIUM CHLORIDE 0.9 % IV SOLN
2.0000 g | Freq: Two times a day (BID) | INTRAVENOUS | Status: DC
  Administered 2024-05-29: 2 g via INTRAVENOUS
  Filled 2024-05-29: qty 12.5

## 2024-05-29 MED ORDER — ROSUVASTATIN CALCIUM 10 MG PO TABS
10.0000 mg | ORAL_TABLET | Freq: Every day | ORAL | Status: DC
  Administered 2024-05-29 – 2024-06-04 (×7): 10 mg via ORAL
  Filled 2024-05-29 (×7): qty 1

## 2024-05-29 MED ORDER — BISACODYL 10 MG RE SUPP: 10.0000 mg | Freq: Every day | RECTAL | Status: DC | PRN

## 2024-05-29 MED ORDER — DONEPEZIL HCL 10 MG PO TABS
10.0000 mg | ORAL_TABLET | Freq: Every day | ORAL | Status: DC
  Administered 2024-05-30 – 2024-06-05 (×7): 10 mg via ORAL
  Filled 2024-05-29 (×3): qty 1
  Filled 2024-05-29: qty 2
  Filled 2024-05-29 (×3): qty 1

## 2024-05-29 MED ORDER — LAMOTRIGINE 25 MG PO TABS
150.0000 mg | ORAL_TABLET | Freq: Two times a day (BID) | ORAL | Status: DC
  Administered 2024-05-29 – 2024-06-05 (×14): 150 mg via ORAL
  Filled 2024-05-29 (×13): qty 2

## 2024-05-29 MED ORDER — OXYCODONE HCL 5 MG PO TABS
5.0000 mg | ORAL_TABLET | ORAL | Status: DC | PRN
  Administered 2024-06-01: 5 mg via ORAL
  Filled 2024-05-29: qty 1

## 2024-05-29 MED ORDER — LACTATED RINGERS IV SOLN: INTRAVENOUS | Status: AC

## 2024-05-29 MED ORDER — ACETAMINOPHEN 650 MG RE SUPP
650.0000 mg | Freq: Four times a day (QID) | RECTAL | Status: DC | PRN
  Administered 2024-05-30: 650 mg via RECTAL
  Filled 2024-05-29: qty 1

## 2024-05-29 MED ORDER — POLYETHYLENE GLYCOL 3350 17 G PO PACK
17.0000 g | PACK | Freq: Two times a day (BID) | ORAL | Status: DC
  Administered 2024-05-31 – 2024-06-04 (×2): 17 g via ORAL
  Filled 2024-05-29 (×12): qty 1

## 2024-05-29 MED ORDER — GABAPENTIN 300 MG PO CAPS
600.0000 mg | ORAL_CAPSULE | Freq: Every day | ORAL | Status: DC
  Administered 2024-05-29 – 2024-06-04 (×7): 600 mg via ORAL
  Filled 2024-05-29 (×7): qty 2

## 2024-05-29 MED ORDER — TIZANIDINE HCL 4 MG PO TABS
4.0000 mg | ORAL_TABLET | Freq: Three times a day (TID) | ORAL | Status: DC
  Administered 2024-05-29 – 2024-05-31 (×4): 4 mg via ORAL
  Filled 2024-05-29 (×4): qty 1

## 2024-05-29 MED ORDER — SODIUM CHLORIDE 0.9 % IV SOLN
2.0000 g | Freq: Once | INTRAVENOUS | Status: AC
  Administered 2024-05-29: 2 g via INTRAVENOUS
  Filled 2024-05-29: qty 12.5

## 2024-05-29 MED ORDER — PRIMIDONE 50 MG PO TABS
100.0000 mg | ORAL_TABLET | Freq: Two times a day (BID) | ORAL | Status: DC
  Administered 2024-05-29 – 2024-06-05 (×14): 100 mg via ORAL
  Filled 2024-05-29 (×15): qty 2

## 2024-05-29 MED ORDER — NITROGLYCERIN 0.4 MG SL SUBL: 0.4000 mg | SUBLINGUAL_TABLET | SUBLINGUAL | Status: DC | PRN

## 2024-05-29 MED ORDER — VENLAFAXINE HCL ER 37.5 MG PO CP24: 37.5000 mg | ORAL_CAPSULE | Freq: Every day | ORAL | Status: DC

## 2024-05-29 MED ORDER — VENLAFAXINE HCL ER 75 MG PO CP24
75.0000 mg | ORAL_CAPSULE | Freq: Every day | ORAL | Status: DC
  Administered 2024-05-30 – 2024-06-05 (×7): 75 mg via ORAL
  Filled 2024-05-29 (×8): qty 1

## 2024-05-29 MED ORDER — TRAZODONE HCL 100 MG PO TABS
200.0000 mg | ORAL_TABLET | Freq: Every day | ORAL | Status: DC
  Administered 2024-05-29 – 2024-06-04 (×7): 200 mg via ORAL
  Filled 2024-05-29 (×7): qty 2

## 2024-05-29 MED ORDER — LEVETIRACETAM 500 MG PO TABS
750.0000 mg | ORAL_TABLET | Freq: Two times a day (BID) | ORAL | Status: DC
  Administered 2024-05-29 – 2024-06-05 (×14): 750 mg via ORAL
  Filled 2024-05-29 (×13): qty 1

## 2024-05-29 NOTE — H&P (Signed)
 History and Physical    [name] FMW:[id number] DOB: [birth year] DOA: 05/29/2024 DOS: the patient was seen and examined on 05/29/2024 PCP: Arloa Jarvis, NP  Patient coming from: Home  Chief Complaint:  Chief Complaint  Patient presents with   Weakness   Fall   HPI: Valerie [name] is a 63 y.o. female with medical history significant for multiple medical comorbidities including arthritis, anxiety, bipolar 1 disorder, CKD stage IIIb, history of colovesicular fistula, dementia, depression, diabetes mellitus type 2, hypertension, hypothyroidism neuromuscular disorder, history of sleep apnea and history of TIA/CVA and other comorbidities who presents with progressive weakness and fall x 2 as well as burning and discomfort in her urine.  She states that she has had multiple urinary tract infections frequently and started developing one a few weeks ago and progressively got worse.  States that she is having burning and discomfort and became weaker and weaker and has been falling more.  States that she fell and slid the floor but did not hit her head.  This has been happening twice in last few days and EMS was called and assisted her to get back to a chair.  Today she had inability to rise and call again for assistance and feels like this when ever she has urinary tract infection.  She denied any chest pain or shortness of breath and no headaches or lightheadedness but just feels weak.  No other concerns or complaints this time and [facility] was asked to admit this patient for sepsis secondary to suspected UTI   Review of Systems: As mentioned in the history of present illness. All other systems reviewed and are negative. Past Medical History:  Diagnosis Date   Anemia    Anxiety    Arthritis    Psoriatic   Bipolar disorder (HCC)    CKD (chronic kidney disease), stage III (HCC)    Colovesical fistula    Dementia (HCC)    Depression    DM type 2 (diabetes mellitus, type 2) (HCC) 11/14/2019    Dyspnea    E coli bacteremia 07/28/2023   Headache    Hyperthyroidism    Neuromuscular disorder (HCC)    Neuropathy    NSTEMI (non-ST elevated myocardial infarction) (HCC) 11/14/2019   OSA (obstructive sleep apnea)    Primary localized osteoarthritis of right knee 10/19/2023   Psoriatic arthritis (HCC)    Pyelonephritis 07/23/2023   Sleep apnea    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   Stroke (HCC)    ?history; MRI normal   Past Surgical History:  Procedure Laterality Date   ABDOMINAL HYSTERECTOMY     APPENDECTOMY     CHOLECYSTECTOMY     CYSTOSCOPY WITH STENT PLACEMENT N/A 07/17/2020   Procedure: CYSTOSCOPY WITH BILATERAL FIREFLY INJECTION;  Surgeon: Renda Glance, MD;  Location: [facility];  Service: Urology;  Laterality: N/A;   FLEXIBLE SIGMOIDOSCOPY N/A 05/17/2020   Procedure: FLEXIBLE SIGMOIDOSCOPY;  Surgeon: Rollin Dover, MD;  Location: [facility];  Service: Endoscopy;  Laterality: N/A;   FOOT SURGERY Right    IR FLUORO GUIDE CV LINE RIGHT  08/19/2020   IR IMAGING GUIDED PORT INSERTION  02/04/2024   IR RADIOLOGIST EVAL & MGMT  09/10/2020   IR RADIOLOGIST EVAL & MGMT  10/10/2020   IR REMOVAL TUN CV CATH W/O FL  09/16/2020   IR SINUS/FIST TUBE CHK-NON GI  10/24/2020   IR SINUS/FIST TUBE CHK-NON GI  10/31/2020   IR US  GUIDE VASC ACCESS RIGHT  08/19/2020   LEFT HEART  CATH AND CORONARY ANGIOGRAPHY N/A 11/14/2019   Procedure: LEFT HEART CATH AND CORONARY ANGIOGRAPHY;  Surgeon: Swazil[place], Peter M, MD;  Location: [facility] INVASIVE CV LAB;  Service: Cardiovascular;  Laterality: N/A;   ORIF PERIPROSTHETIC FRACTURE Right 01/03/2024   Procedure: OPEN REDUCTION INTERNAL FIXATION (ORIF) PERIPROSTHETIC FRACTURE;  Surgeon: Kendal Franky SQUIBB, MD;  Location: [facility];  Service: Orthopedics;  Laterality: Right;   PARATHYROID  EXPLORATION N/A 01/09/2021   Procedure: POSSIBLE NECK EXPLORATION;  Surgeon: Eletha Boas, MD;  Location: [facility];  Service: General;  Laterality: N/A;   PARATHYROIDECTOMY Left  01/09/2021   Procedure: LEFT INFERIOR PARATHYROIDECTOMY;  Surgeon: Eletha Boas, MD;  Location: [facility];  Service: General;  Laterality: Left;   PROCTOSCOPY N/A 07/17/2020   Procedure: RIGID PROCTOSCOPY;  Surgeon: Sheldon Standing, MD;  Location: [facility];  Service: General;  Laterality: N/A;   TONSILLECTOMY     TOTAL HIP ARTHROPLASTY Bilateral    TOTAL KNEE ARTHROPLASTY Right 10/19/2023   Procedure: TOTAL KNEE ARTHROPLASTY;  Surgeon: Josefina Chew, MD;  Location: [facility];  Service: Orthopedics;  Laterality: Right;   Social History:  reports that she has quit smoking. Her smoking use included cigarettes and e-cigarettes. She has been exposed to tobacco smoke. She has never used smokeless tobacco. She reports current alcohol use. She reports that she does not use drugs.  Allergies  Allergen Reactions   Carbamazepine Other (See Comments)    Parkinsons like symptoms, tremors   Sertraline Hcl Other (See Comments)    Parkinsons like symptoms    Family History  Problem Relation Age of Onset   Breast cancer Neg Hx     Prior to Admission medications   Medication Sig Start Date End Date Taking? Authorizing Provider  aspirin  EC 81 MG tablet Take 81 mg by mouth daily. Swallow whole.   Yes [provider]  carbidopa -levodopa  (SINEMET  IR) 25-100 MG tablet Take 1 tablet by mouth in the morning and at bedtime. 08/27/22   [provider]  desvenlafaxine (PRISTIQ) 25 MG 24 hr tablet Take 25 mg by mouth daily. Patient taking differently: Take 50 mg by mouth daily. 03/13/24   [provider]  Docusate Calcium  (STOOL SOFTENER PO) Take 1 tablet by mouth in the morning and at bedtime.    [provider]  donepezil  (ARICEPT ) 10 MG tablet Take 10 mg by mouth daily. 10/30/19   [provider]  ENBREL 50 MG/ML injection Inject 50 mg into the skin See admin instructions. Take 50 mg 2 times a week for 4 weeks take 50 mg once  a week 12/16/23   [provider]  fluconazole   (DIFLUCAN ) 150 MG tablet Take 150 mg by mouth every 3 (three) days. 07/29/23   [provider]  gabapentin  (NEURONTIN ) 300 MG capsule Take 300-600 mg by mouth See admin instructions. Take 300 mg in the morning and 600 mg at night 07/29/23   [provider]  lamoTRIgine  (LAMICTAL ) 150 MG tablet Take 1 tablet by mouth 2 (two) times daily. 12/28/22   [provider]  Lecanemab-irmb (LEQEMBI IV) Inject into the vein every 14 (fourteen) days.    [provider]  levETIRAcetam  (KEPPRA ) 750 MG tablet Take 750 mg by mouth 2 (two) times daily.    [provider]  levocetirizine (XYZAL ) 5 MG tablet Take 1 tablet (5 mg total) by mouth every evening. 08/11/23   Rollene Almarie LABOR, MD  meclizine  (ANTIVERT ) 25 MG tablet Take 25 mg by mouth 3 (three) times daily as needed  for dizziness.    [provider]  memantine  (NAMENDA ) 10 MG tablet Take 10 mg by mouth 2 (two) times daily. 03/18/22   [provider]  Multiple Vitamins-Minerals (MULTIVITAMIN WITH MINERALS) tablet Take 1 tablet by mouth at bedtime.    [provider]  nitroGLYCERIN  (NITROSTAT ) 0.4 MG SL tablet Place 1 tablet (0.4 mg total) under the tongue every 5 (five) minutes as needed for chest pain. 05/03/23   Merlynn Niki FALCON, FNP  Omega-3 Fatty Acids (FISH OIL PO) Take 2 capsules by mouth daily.    [provider]  omeprazole  (PRILOSEC) 40 MG capsule Take 1 capsule (40 mg total) by mouth daily. 08/11/23   Rollene Almarie LABOR, MD  ondansetron  (ZOFRAN ) 8 MG tablet Take 1 tablet (8 mg total) by mouth every 8 (eight) hours as needed for nausea or vomiting. 10/19/23   Josefina Chew, MD  ondansetron  (ZOFRAN -ODT) 8 MG disintegrating tablet Take 8 mg by mouth every 8 (eight) hours as needed for nausea or vomiting. 09/15/22   [provider]  oxybutynin  (DITROPAN ) 5 MG tablet Take 5 mg by mouth 2 (two) times daily. 09/13/23   [provider]  polyethylene glycol (MIRALAX   / GLYCOLAX ) 17 g packet Take 17 g by mouth 2 (two) times daily. 07/28/23   Briana Elgin LABOR, MD  predniSONE  (DELTASONE ) 5 MG tablet Take 5 mg by mouth daily.    [provider]  primidone  (MYSOLINE ) 50 MG tablet Take 100 mg by mouth 2 (two) times daily. 04/09/22   [provider]  rizatriptan  (MAXALT ) 10 MG tablet Take 10 mg by mouth as needed for migraine. May repeat in 2 hours if needed    [provider]  rosuvastatin  (CRESTOR ) 10 MG tablet Take 1 tablet (10 mg total) by mouth daily. 12/04/22   Rollene Almarie LABOR, MD  sennosides-docusate sodium  (SENOKOT-S) 8.6-50 MG tablet Take 2 tablets by mouth daily. 10/19/23   Josefina Chew, MD  tiZANidine  (ZANAFLEX ) 4 MG tablet Take 1 tablet (4 mg total) by mouth 3 (three) times daily. 01/05/24   Danton Lauraine LABOR, PA-C  traZODone  (DESYREL ) 100 MG tablet Take 2 tablets (200 mg total) by mouth at bedtime. 07/23/20   Tonnie George, PA-C   Physical Exam: Vitals:   05/29/24 1007 05/29/24 1355 05/29/24 1832 05/29/24 1915  BP: (!) 120/53 109/68 120/67 117/60  Pulse: 95 91 88 89  Resp: 16 (!) 23 (!) 26 (!) 24  Temp: 98.1 F (36.7 C) 98.2 F (36.8 C) (!) 101 F (38.3 C)   TempSrc: Oral Oral Oral   SpO2: 97% 90% 94% 93%   Examination: Physical Exam:  Constitutional: WN/WD obese chronically ill-appearing elderly Caucasian female who is fatigued appearing Respiratory: Diminished to auscultation bilaterally, no wheezing, rales, rhonchi or crackles. Normal respiratory effort and patient is not tachypenic. No accessory muscle use.  Unlabored breathing Cardiovascular: RRR, no murmurs / rubs / gallops. S1 and S2 auscultated.  Mild extremity edema Abdomen: Soft, non-tender, distended secondary body habitus. Bowel sounds positive.  GU: Deferred. Musculoskeletal: No clubbing / cyanosis of digits/nails. No joint deformity upper and lower extremities.  Skin: No rashes, lesions, ulcers on limited skin evaluation. No induration; Warm and  dry.  Neurologic: CN 2-12 grossly intact with no focal deficits. Romberg sign and cerebellar reflexes not assessed.  Psychiatric: She is awake and alert and answers questions appropriately  Data Reviewed:  Recent Results (from the past 2160 hours)  ECHOCARDIOGRAM COMPLETE     Status: None  Collection Time: 03/01/24  2:58 PM  Result Value Ref Range   Area-P 1/2 3.43 cm2   S' Lateral 2.80 cm   Est EF 65 - 70%   Comprehensive metabolic panel     Status: Abnormal   Collection Time: 05/29/24 10:22 AM  Result Value Ref Range   Sodium 133 (L) 135 - 145 mmol/L   Potassium 4.4 3.5 - 5.1 mmol/L    Comment: HEMOLYSIS AT THIS LEVEL MAY AFFECT RESULT   Chloride 98 98 - 111 mmol/L   CO2 22 22 - 32 mmol/L   Glucose, Bld 136 (H) 70 - 99 mg/dL    Comment: Glucose reference range applies only to samples taken after fasting for at least 8 hours.   BUN 38 (H) 8 - 23 mg/dL   Creatinine, Ser 7.92 (H) 0.44 - 1.00 mg/dL   Calcium  9.1 8.9 - 10.3 mg/dL   Total Protein 7.0 6.5 - 8.1 g/dL   Albumin  4.1 3.5 - 5.0 g/dL   AST 37 15 - 41 U/L    Comment: HEMOLYSIS AT THIS LEVEL MAY AFFECT RESULT   ALT 16 0 - 44 U/L   Alkaline Phosphatase 109 38 - 126 U/L   Total Bilirubin 0.4 0.0 - 1.2 mg/dL   GFR, Estimated 26 (L) >60 mL/min    Comment: (NOTE) Calculated using the CKD-EPI Creatinine Equation (2021)    Anion gap 13 5 - 15    Comment: Performed at [facility], [address], [place], [place] [address]  CBC with Differential     Status: Abnormal   Collection Time: 05/29/24 10:22 AM  Result Value Ref Range   WBC 16.6 (H) 4.0 - 10.5 K/uL   RBC 3.84 (L) 3.87 - 5.11 MIL/uL   Hemoglobin 11.2 (L) 12.0 - 15.0 g/dL   HCT 64.1 (L) 63.9 - 53.9 %   MCV 93.2 80.0 - 100.0 fL   MCH 29.2 26.0 - 34.0 pg   MCHC 31.3 30.0 - 36.0 g/dL   RDW 84.4 88.4 - 84.4 %   Platelets 212 150 - 400 K/uL   nRBC 0.0 0.0 - 0.2 %   Neutrophils Relative % 88 %   Neutro Abs 14.4 (H) 1.7 - 7.7 K/uL   Lymphocytes  Relative 4 %   Lymphs Abs 0.7 0.7 - 4.0 K/uL   Monocytes Relative 7 %   Monocytes Absolute 1.2 (H) 0.1 - 1.0 K/uL   Eosinophils Relative 0 %   Eosinophils Absolute 0.1 0.0 - 0.5 K/uL   Basophils Relative 0 %   Basophils Absolute 0.1 0.0 - 0.1 K/uL   Immature Granulocytes 1 %   Abs Immature Granulocytes 0.14 (H) 0.00 - 0.07 K/uL    Comment: Performed at [facility], [address] [place], [place] [address]  Protime-INR     Status: None   Collection Time: 05/29/24 10:22 AM  Result Value Ref Range   Prothrombin Time 15.2 11.4 - 15.2 seconds   INR 1.1 0.8 - 1.2    Comment: (NOTE) INR goal varies based on device and disease states. Performed at [facility], [address], [place], [place] [address]   I-Stat Lactic Acid, ED     Status: None   Collection Time: 05/29/24 11:34 AM  Result Value Ref Range   Lactic Acid, Venous 1.1 0.5 - 1.9 mmol/L  Urinalysis, w/ Reflex to Culture (Infection Suspected) -Urine, Clean Catch     Status: Abnormal   Collection Time: 05/29/24  1:54 PM  Result Value Ref Range   Specimen Source URINE, CLEAN CATCH    Color, Urine YELLOW YELLOW   APPearance HAZY (A) CLEAR   Specific Gravity, Urine 1.006 1.005 - 1.030   pH 5.0 5.0 - 8.0   Glucose, UA NEGATIVE NEGATIVE mg/dL   Hgb urine dipstick SMALL (A) NEGATIVE   Bilirubin Urine NEGATIVE NEGATIVE   Ketones, ur NEGATIVE NEGATIVE mg/dL   Protein, ur NEGATIVE NEGATIVE mg/dL   Nitrite POSITIVE (A) NEGATIVE   Leukocytes,Ua LARGE (A) NEGATIVE   RBC / HPF 0-5 0 - 5 RBC/hpf   WBC, UA >50 0 - 5 WBC/hpf    Comment:        Reflex urine culture not performed if WBC <=10, OR if Squamous epithelial cells >5. If Squamous epithelial cells >5 suggest recollection.    Bacteria, UA FEW (A) NONE SEEN   Squamous Epithelial / HPF 0-5 0 - 5 /HPF    Comment: Performed at [facility], [address], [place], [place] [address]   EKG: Showed a sinus rhythm with a  rate of 93 and a QTc of 416 with no evidence of ST elevation on my interpretation  Assessment and Plan: No notes have been filed under this hospital service. Service: Hospitalist  Sepsis 2/2 Suspected UTI: Presents w/ Urinary Discomfort and Burning w/ a Leukocytosis of 16.6 and increased RR. Given 1 Liter in the ED and started on mIVF. Has had multiple UTI's in the past and states ~ 7 this year. LA is 1.1. Placed on IV Cefepime and will continue. U/A showed a hazy appearance with small hemoglobin, large leukocytes, positive nitrites, 5 pH, few bacteria, greater than 50 WBCs, 0-5 squamous epithelial cells and 0-5 RBCs per high-power field.  Urine cultures pending and blood cultures pending x 2.  Chest x-ray showed no acute cardiopulmonary disease.  Getting IV fluid hydration with LR at 150 mL/h for 20 hours  Fall: Did not hit head. Related to Weakness from Above. Will need PT/OT to evaluate and treat. IVF Hydration as above and ill need PT/OT to evalaute and Treat  AKI on CKD Stage 3b: BUN/Cr Trend: Recent Labs  Lab 05/29/24 1022  BUN 38*  CREATININE 2.07*  -IVF as above -Avoid Nephrotoxic Medications, Contrast Dyes, Hypotension and Dehydration to Ensure Adequate Renal Perfusion and will need to Renally Adjust Meds. Continue to Monitor and Trend Renal Function carefully and repeat CMP in the AM   Hyponatremia: Mild. Na+ is now 133. IVF as above. CTM and Trend and Repeat CMP in the AM  Alzheimer's Dementia: C/w Donepezil  10 mg po Daily and Memantine  10 mg po BID; Delirium Precautions  Neuromuscular Disorder /Parkinson's: C/w Carbidopa -Levodopa  25-100 1 tab po BID  HLD/ Nonobstructive CAD: C/w ASA 81 mg po Daily and Rosuvastatin  10 mg po at bedtime  Bipolar 1 Disorder/Panic Disorder/GAD/Insomnia w/ Sleep Apnea: C/w Desvenlafaxine 50 mg po Daily, Lamotrigine  150 mg po BID, Trazodone  200 mg po at bedtime;   Seizure Disorder: C/w Lamtorigine 150 mg po BID and Levetiracetam  750 mg po BID. Place  on Seizure Precautions and Gabapentin  300-600 mg po Daily  Normocytic Anemia: Hgb/Hct Trend:  Recent Labs  Lab 05/29/24 1022  HGB 11.2*  HCT 35.8*  MCV 93.2  -Check Anemia Panel in the AM. CTM for S/Sx of Bleeding; No overt bleeding noted. Repeat CBC in the AM   GERD/GI Prophylaxis: C/w Pantoprazole  40 mg po Daily.  Class II Obesity: Complicates overall prognosis and care. Estimated  body mass index is 38.41 kg/m as calculated from the following:   Height as of 04/18/24: 5' 2 (1.575 m).   Weight as of 04/18/24: 95.3 kg. Weight Loss and Dietary Counseling given   Advance Care Planning:   Code Status: Full Code   Consults: None  Family Communication: D/w Daughter @ bedside  Severity of Illness: The appropriate patient status for this patient is INPATIENT. Inpatient status is judged to be reasonable and necessary in order to provide the required intensity of service to ensure the patient's safety. The patient's presenting symptoms, physical exam findings, and initial radiographic and laboratory data in the context of their chronic comorbidities is felt to place them at high risk for further clinical deterioration. Furthermore, it is not anticipated that the patient will be medically stable for discharge from the hospital within 2 midnights of admission.   * I certify that at the point of admission it is my clinical judgment that the patient will require inpatient hospital care spanning beyond 2 midnights from the point of admission due to high intensity of service, high risk for further deterioration and high frequency of surveillance required.*  Author: Alejandro Marker, DO [facility]  05/29/2024 8:49 PM  For on call review [email or web address].

## 2024-05-29 NOTE — ED Triage Notes (Addendum)
 Pt BIB EMS from home for generalized weakness x2 days. Pt also had a fall this morning and yesterday, but did not hit her head. Pt is not on blood thinners as well. Pt stated last time this happened I had a UTI.

## 2024-05-29 NOTE — ED Notes (Signed)
 Sat 83%, placed pt on 2L of O2; pt sat increased to 90%

## 2024-05-29 NOTE — ED Provider Notes (Signed)
 [place] EMERGENCY DEPARTMENT AT [facility] Provider Note   CSN: [id number] Arrival date & time: 05/29/24  1000     Patient presents with: Weakness and Fall   Valerie [name] is a 63 y.o. female.   HPI Patient arrives via EMS after a fall.  Patient complains of worsening weakness over the past few days, and today she was so weak that upon attempting to stand she slid to the floor.  This has happened twice in the past 3 days, once requiring EMS assistance to get back into her chair.  Today with inability to arise she called again for assistance.  She denies focal pain, focal weakness.  She notes a history of sepsis, urinary tract infection in the fall earlier this year that required hip surgery. EMS reports no hemodynamic instability in transport.    Prior to Admission medications   Medication Sig Start Date End Date Taking? Authorizing Provider  aspirin  EC 81 MG tablet Take 81 mg by mouth daily. Swallow whole.    [provider]  carbidopa -levodopa  (SINEMET  IR) 25-100 MG tablet Take 1 tablet by mouth in the morning and at bedtime. 08/27/22   [provider]  desvenlafaxine (PRISTIQ) 25 MG 24 hr tablet Take 25 mg by mouth daily. Patient taking differently: Take 50 mg by mouth daily. 03/13/24   [provider]  Docusate Calcium  (STOOL SOFTENER PO) Take 1 tablet by mouth in the morning and at bedtime.    [provider]  donepezil  (ARICEPT ) 10 MG tablet Take 10 mg by mouth daily. 10/30/19   [provider]  ENBREL 50 MG/ML injection Inject 50 mg into the skin See admin instructions. Take 50 mg 2 times a week for 4 weeks take 50 mg once  a week 12/16/23   [provider]  fluconazole  (DIFLUCAN ) 150 MG tablet Take 150 mg by mouth every 3 (three) days. 07/29/23   [provider]  gabapentin  (NEURONTIN ) 300 MG capsule Take 300-600 mg by mouth See admin instructions. Take 300 mg in the morning and 600 mg at night 07/29/23    [provider]  lamoTRIgine  (LAMICTAL ) 150 MG tablet Take 1 tablet by mouth 2 (two) times daily. 12/28/22   [provider]  Lecanemab-irmb (LEQEMBI IV) Inject into the vein every 14 (fourteen) days.    [provider]  levETIRAcetam  (KEPPRA ) 750 MG tablet Take 750 mg by mouth 2 (two) times daily.    [provider]  levocetirizine (XYZAL ) 5 MG tablet Take 1 tablet (5 mg total) by mouth every evening. 08/11/23   Rollene Almarie LABOR, MD  meclizine  (ANTIVERT ) 25 MG tablet Take 25 mg by mouth 3 (three) times daily as needed for dizziness.    [provider]  memantine  (NAMENDA ) 10 MG tablet Take 10 mg by mouth 2 (two) times daily. 03/18/22   [provider]  Multiple Vitamins-Minerals (MULTIVITAMIN WITH MINERALS) tablet Take 1 tablet by mouth at bedtime.    [provider]  nitroGLYCERIN  (NITROSTAT ) 0.4 MG SL tablet Place 1 tablet (0.4 mg total) under the tongue every 5 (five) minutes as needed for chest pain. 05/03/23   Merlynn Niki FALCON, FNP  Omega-3 Fatty Acids (FISH OIL PO) Take 2 capsules by mouth daily.    [provider]  omeprazole  (PRILOSEC) 40 MG capsule Take 1 capsule (40 mg total) by mouth daily. 08/11/23   Rollene Almarie LABOR, MD  ondansetron  (ZOFRAN ) 8 MG tablet Take 1 tablet (8 mg total) by mouth  every 8 (eight) hours as needed for nausea or vomiting. 10/19/23   Josefina Chew, MD  ondansetron  (ZOFRAN -ODT) 8 MG disintegrating tablet Take 8 mg by mouth every 8 (eight) hours as needed for nausea or vomiting. 09/15/22   [provider]  oxybutynin  (DITROPAN ) 5 MG tablet Take 5 mg by mouth 2 (two) times daily. 09/13/23   [provider]  polyethylene glycol (MIRALAX  / GLYCOLAX ) 17 g packet Take 17 g by mouth 2 (two) times daily. 07/28/23   Briana Elgin LABOR, MD  predniSONE  (DELTASONE ) 5 MG tablet Take 5 mg by mouth daily.    [provider]  primidone  (MYSOLINE ) 50 MG tablet Take 100 mg by mouth 2  (two) times daily. 04/09/22   [provider]  rizatriptan  (MAXALT ) 10 MG tablet Take 10 mg by mouth as needed for migraine. May repeat in 2 hours if needed    [provider]  rosuvastatin  (CRESTOR ) 10 MG tablet Take 1 tablet (10 mg total) by mouth daily. 12/04/22   Rollene Almarie LABOR, MD  sennosides-docusate sodium  (SENOKOT-S) 8.6-50 MG tablet Take 2 tablets by mouth daily. 10/19/23   Josefina Chew, MD  tiZANidine  (ZANAFLEX ) 4 MG tablet Take 1 tablet (4 mg total) by mouth 3 (three) times daily. 01/05/24   Danton Lauraine LABOR, PA-C  traZODone  (DESYREL ) 100 MG tablet Take 2 tablets (200 mg total) by mouth at bedtime. 07/23/20   Tonnie George, PA-C    Allergies: Carbamazepine and Sertraline hcl    Review of Systems  Updated Vital Signs BP 109/68 (BP Location: Left Arm)   Pulse 91   Temp 98.2 F (36.8 C) (Oral)   Resp (!) 23   SpO2 90%   Physical Exam Vitals and nursing note reviewed.  Constitutional:      General: She is not in acute distress.    Appearance: She is well-developed. She is obese. She is ill-appearing. She is not toxic-appearing or diaphoretic.  HENT:     Head: Normocephalic and atraumatic.  Eyes:     Conjunctiva/sclera: Conjunctivae normal.  Cardiovascular:     Rate and Rhythm: Normal rate and regular rhythm.  Pulmonary:     Effort: Pulmonary effort is normal. No respiratory distress.     Breath sounds: Normal breath sounds. No stridor.  Abdominal:     General: There is no distension.  Musculoskeletal:     Comments: No appreciable deformities, patient moves all extremity spontaneously.  Skin:    General: Skin is warm and dry.  Neurological:     Mental Status: She is alert and oriented to person, place, and time.     Cranial Nerves: No cranial nerve deficit.  Psychiatric:        Mood and Affect: Mood normal.     (all labs ordered are listed, but only abnormal results are displayed) Labs Reviewed  COMPREHENSIVE METABOLIC PANEL WITH GFR  - Abnormal; Notable for the following components:      Result Value   Sodium 133 (*)    Glucose, Bld 136 (*)    BUN 38 (*)    Creatinine, Ser 2.07 (*)    GFR, Estimated 26 (*)    All other components within normal limits  CBC WITH DIFFERENTIAL/PLATELET - Abnormal; Notable for the following components:   WBC 16.6 (*)    RBC 3.84 (*)    Hemoglobin 11.2 (*)    HCT 35.8 (*)    Neutro Abs 14.4 (*)    Monocytes Absolute 1.2 (*)  Abs Immature Granulocytes 0.14 (*)    All other components within normal limits  URINALYSIS, W/ REFLEX TO CULTURE (INFECTION SUSPECTED) - Abnormal; Notable for the following components:   APPearance HAZY (*)    Hgb urine dipstick SMALL (*)    Nitrite POSITIVE (*)    Leukocytes,Ua LARGE (*)    Bacteria, UA FEW (*)    All other components within normal limits  CULTURE, BLOOD (ROUTINE X 2)  CULTURE, BLOOD (ROUTINE X 2)  URINE CULTURE  PROTIME-INR  I-STAT CG4 LACTIC ACID, ED  I-STAT CG4 LACTIC ACID, ED    EKG: EKG Interpretation Date/Time:  Monday May 29 2024 10:17:11 EDT Ventricular Rate:  93 PR Interval:  196 QRS Duration:  87 QT Interval:  334 QTC Calculation: 416 R Axis:   25  Text Interpretation: Sinus rhythm Confirmed by Garrick Charleston [phone]) on 05/29/2024 10:44:48 AM  Radiology: [facility] Chest Port 1 View Result Date: 05/29/2024 EXAM: 1 VIEW(S) XRAY OF THE CHEST 05/29/2024 10:30:51 AM COMPARISON: 01/02/2024 CLINICAL HISTORY: Questionable sepsis - evaluate for abnormality. Questionable sepsis FINDINGS: LINES, TUBES AND DEVICES: Right chest port in place with tip overlying the SVC. LUNGS AND PLEURA: Low lung volumes. No focal pulmonary opacity. No pulmonary edema. No pleural effusion. No pneumothorax. HEART AND MEDIASTINUM: No acute abnormality of the cardiac and mediastinal silhouettes. BONES AND SOFT TISSUES: Remote right rib fracture. IMPRESSION: 1. No acute cardiopulmonary abnormality. 2. Low lung volumes. Electronically signed by: Evalene Coho MD 05/29/2024 10:39 AM EDT RP Workstation: [id number]     Procedures   Medications Ordered in the ED  lactated ringers  infusion (0 mLs Intravenous Hold 05/29/24 1350)  lactated ringers  bolus 1,000 mL (1,000 mLs Intravenous New Bag/Given 05/29/24 1340)  ceFEPIme (MAXIPIME) 2 g in sodium chloride  0.9 % 100 mL IVPB (has no administration in time range)  ceFEPIme (MAXIPIME) 2 g in sodium chloride  0.9 % 100 mL IVPB (2 g Intravenous New Bag/Given 05/29/24 1340)                                    Medical Decision Making Elderly female with prior episodes of weakness, fall, now presents with weakness, multiple falls.  Broad differential including electrolyte abnormalities, dehydration, renal dysfunction, infection, bacteremia, sepsis. Absence of specific pain or weakness is reassuring, labs imaging all started. Cardiac 95 sinus normal pulse ox 97% room air normal  Amount and/or Complexity of Data Reviewed Independent Historian: EMS External Data Reviewed: notes. Labs: ordered. Decision-making details documented in ED Course. Radiology: ordered and independent interpretation performed. Decision-making details documented in ED Course. ECG/medicine tests: ordered and independent interpretation performed. Decision-making details documented in ED Course.  Risk Prescription drug management. Decision regarding hospitalization. Diagnosis or treatment significantly limited by social determinants of health.   Labs concerning with leukocytosis, worsening renal function, however there is no lactic acidosis.  2:39 PM Patient now coming by her daughter.  We discussed the findings, patient meets SIRS criteria, has infection, thus sepsis, no evidence for septic shock, however. Patient is mentating appropriately, has received initial fluid bolus given renal dysfunction, as well as initial antibiotics.  Given concern for sepsis, with worsening renal function patient will be admitted for further  monitoring, management.  CRITICAL CARE Performed by: [place] Garrick Total critical care time: 35 minutes Critical care time was exclusive of separately billable procedures and treating other patients. Critical care was necessary to treat or prevent imminent or life-threatening  deterioration. Critical care was time spent personally by me on the following activities: development of treatment plan with patient and/or surrogate as well as nursing, discussions with consultants, evaluation of patient's response to treatment, examination of patient, obtaining history from patient or surrogate, ordering and performing treatments and interventions, ordering and review of laboratory studies, ordering and review of radiographic studies, pulse oximetry and re-evaluation of patient's condition.   Final diagnoses:  Severe sepsis [facility])    ED Discharge Orders     None          Garrick Charleston, MD 05/29/24 1442

## 2024-05-29 NOTE — Sepsis Progress Note (Signed)
 Sepsis protocol monitored by eLink

## 2024-05-29 NOTE — Progress Notes (Signed)
 PHARMACY NOTE:  ANTIMICROBIAL RENAL DOSAGE ADJUSTMENT  Current antimicrobial regimen includes a mismatch between antimicrobial dosage and estimated renal function.  As per policy approved by the Pharmacy & Therapeutics and Medical Executive Committees, the antimicrobial dosage will be adjusted accordingly.  Current antimicrobial dosage:  cefepime 2 g IV every 24 hours  Indication: sepsis  Renal Function:  CrCl cannot be calculated (Unknown ideal weight.). []      On intermittent HD, scheduled: []      On CRRT    Estimated CrCl 39 ml/min using previous actual weight of 90.8 kg  Antimicrobial dosage has been changed to:  cefepime 2 g IV q12h  Additional comments:   Thank you for allowing pharmacy to be a part of this patient's care.  Eleanor Agent, [facility] 05/29/2024 3:02 PM

## 2024-05-30 DIAGNOSIS — N390 Urinary tract infection, site not specified: Secondary | ICD-10-CM | POA: Diagnosis not present

## 2024-05-30 DIAGNOSIS — B9620 Unspecified Escherichia coli [E. coli] as the cause of diseases classified elsewhere: Secondary | ICD-10-CM | POA: Diagnosis not present

## 2024-05-30 DIAGNOSIS — R7881 Bacteremia: Secondary | ICD-10-CM | POA: Diagnosis not present

## 2024-05-30 LAB — BLOOD CULTURE ID PANEL (REFLEXED) - BCID2

## 2024-05-30 LAB — PHOSPHORUS: Phosphorus: 2.8 mg/dL (ref 2.5–4.6)

## 2024-05-30 LAB — COMPREHENSIVE METABOLIC PANEL WITH GFR
ALT: <5 U/L (ref 0–44)
AST: 31 U/L (ref 15–41)
Albumin: 3.1 g/dL — ABNORMAL LOW (ref 3.5–5.0)
Alkaline Phosphatase: 90 U/L (ref 38–126)
Anion gap: 11 (ref 5–15)
BUN: 33 mg/dL — ABNORMAL HIGH (ref 8–23)
CO2: 20 mmol/L — ABNORMAL LOW (ref 22–32)
Calcium: 8.1 mg/dL — ABNORMAL LOW (ref 8.9–10.3)
Chloride: 105 mmol/L (ref 98–111)
Creatinine, Ser: 1.84 mg/dL — ABNORMAL HIGH (ref 0.44–1.00)
GFR, Estimated: 30 mL/min — ABNORMAL LOW (ref >60)
Glucose, Bld: 107 mg/dL — ABNORMAL HIGH (ref 70–99)
Potassium: 4.3 mmol/L (ref 3.5–5.1)
Sodium: 136 mmol/L (ref 135–145)
Total Bilirubin: 0.4 mg/dL (ref 0.0–1.2)
Total Protein: 5.7 g/dL — ABNORMAL LOW (ref 6.5–8.1)

## 2024-05-30 LAB — CBC WITH DIFFERENTIAL/PLATELET
Abs Immature Granulocytes: 0.07 K/uL (ref 0.00–0.07)
Basophils Absolute: 0 K/uL (ref 0.0–0.1)
Basophils Relative: 0 %
Eosinophils Absolute: 0.2 K/uL (ref 0.0–0.5)
Eosinophils Relative: 2 %
HCT: 31.6 % — ABNORMAL LOW (ref 36.0–46.0)
Hemoglobin: 9.8 g/dL — ABNORMAL LOW (ref 12.0–15.0)
Immature Granulocytes: 1 %
Lymphocytes Relative: 6 %
Lymphs Abs: 0.6 K/uL — ABNORMAL LOW (ref 0.7–4.0)
MCH: 29.6 pg (ref 26.0–34.0)
MCHC: 31 g/dL (ref 30.0–36.0)
MCV: 95.5 fL (ref 80.0–100.0)
Monocytes Absolute: 1.1 K/uL — ABNORMAL HIGH (ref 0.1–1.0)
Monocytes Relative: 11 %
Neutro Abs: 8.3 K/uL — ABNORMAL HIGH (ref 1.7–7.7)
Neutrophils Relative %: 80 %
Platelets: 157 K/uL (ref 150–400)
RBC: 3.31 MIL/uL — ABNORMAL LOW (ref 3.87–5.11)
RDW: 15.9 % — ABNORMAL HIGH (ref 11.5–15.5)
WBC: 10.3 K/uL (ref 4.0–10.5)
nRBC: 0 % (ref 0.0–0.2)

## 2024-05-30 LAB — VITAMIN B12: Vitamin B-12: 642 pg/mL (ref 180–914)

## 2024-05-30 LAB — FERRITIN: Ferritin: 109 ng/mL (ref 11–307)

## 2024-05-30 LAB — RETICULOCYTES
Immature Retic Fract: 15.3 % (ref 2.3–15.9)
RBC.: 3.39 MIL/uL — ABNORMAL LOW (ref 3.87–5.11)
Retic Count, Absolute: 65.4 K/uL (ref 19.0–186.0)
Retic Ct Pct: 1.9 % (ref 0.4–3.1)

## 2024-05-30 LAB — TSH: TSH: 0.925 u[IU]/mL (ref 0.350–4.500)

## 2024-05-30 LAB — IRON AND TIBC
Iron: <10 ug/dL — ABNORMAL LOW (ref 28–170)
TIBC: 228 ug/dL — ABNORMAL LOW (ref 250–450)

## 2024-05-30 LAB — FOLATE: Folate: >20 ng/mL (ref >5.9)

## 2024-05-30 LAB — MAGNESIUM: Magnesium: 2.3 mg/dL (ref 1.7–2.4)

## 2024-05-30 MED ORDER — POLYSACCHARIDE IRON COMPLEX 150 MG PO CAPS
150.0000 mg | ORAL_CAPSULE | Freq: Every day | ORAL | Status: DC
  Administered 2024-05-30 – 2024-06-05 (×7): 150 mg via ORAL
  Filled 2024-05-30 (×7): qty 1

## 2024-05-30 MED ORDER — SODIUM CHLORIDE 0.9 % IV BOLUS
500.0000 mL | Freq: Once | INTRAVENOUS | Status: AC
  Administered 2024-05-30: 500 mL via INTRAVENOUS

## 2024-05-30 MED ORDER — SODIUM CHLORIDE 0.9 % IV SOLN
2.0000 g | INTRAVENOUS | Status: DC
  Administered 2024-05-30 – 2024-06-04 (×6): 2 g via INTRAVENOUS
  Filled 2024-05-30 (×6): qty 20

## 2024-05-30 MED ORDER — LACTATED RINGERS IV SOLN: INTRAVENOUS | Status: AC

## 2024-05-30 MED ORDER — ORAL CARE MOUTH RINSE: 15.0000 mL | OROMUCOSAL | Status: DC | PRN

## 2024-05-30 NOTE — Hospital Course (Addendum)
 63 year old woman multiple medical problems presented with a fall and urinary symptoms.  Admitted for sepsis secondary to UTI, AKI.  Treated with empiric antibiotics with gradual clinical improvement and resolution of AKI.  Urine pathogen with resistance pattern requiring IV antibiotics to complete treatment.  Afterwards plan SNF though pt now thinking about going home.  Consultants None  Procedures/Events None

## 2024-05-30 NOTE — Progress Notes (Signed)
 PROGRESS NOTE    Valerie [name]  FMW:[id number] DOB: [birth year] DOA: 05/29/2024 PCP: Arloa Jarvis, NP   Brief Narrative:  Valerie [name] is a 63 y.o. female w/ PMH significant for multiple medical comorbidities including arthritis, anxiety, Bipolar 1 disorder, CKD stage IIIb, history of colovesicular fistula, dementia, depression, DMT2, HTN, hypothyroidism neuromuscular disorder, history of sleep apnea and history of TIA/CVA and other comorbidities who presents with progressive weakness and fall x 2 as well as burning and discomfort in her urine.  Further workup reveals that she was septic secondary to E. coli Bacteremia and suspected UTI.  She was placed on IV cefepime initially but now been changed to IV Ceftriaxone .  Assessment and Plan:  Sepsis 2/2 E Coli Bacteremia and UTI: Presented w/ Urinary Discomfort and Burning w/ a Leukocytosis of 16.6 and increased RR. Had a T Max of 102.9 overnight. Given 1 Liter in the ED and started on mIVF w/ LR 150 mL/hr x20 hours; Will now renew at 75 mL/hr x 20 hours. Has had multiple UTI's in the past and states ~ 7 this year. LA is 1.1. Placed on IV Cefepime and de-escalated to IV Ceftriaxone  2 grams daily. U/A showed a hazy appearance with small hemoglobin, large leukocytes, positive nitrites, 5 pH, few bacteria, greater than 50 WBCs, 0-5 squamous epithelial cells and 0-5 RBCs per high-power field.  Urine cultures pending and Blood Cx showed 3/4 Bottles of E Coli w/ Sensitivities pending. Chest x-ray showed no ACP Disease.    Fall: Did not hit head. Related to Weakness from Above. Will need PT/OT to evaluate and treat. IVF Hydration as above and ill need PT/OT to evalaute and Treat   AKI on CKD Stage 3b / Metabolic Acidosis: BUN/Cr went from 38/2.07 -> 33/1.84.  Has slight metabolic acidosis with a CO2 of 20, anion gap of 11, chloride level 105. IVF as above. Avoid Nephrotoxic Medications, Contrast Dyes, Hypotension and Dehydration to Ensure Adequate Renal  Perfusion and will need to Renally Adjust Meds. CTM and Trend Renal Function carefully and repeat CMP in the AM    Hyponatremia: Mild and Improved. Na+ is now 136. IVF as above. CTM and Trend and Repeat CMP in the AM   Alzheimer's Dementia: C/w Donepezil  10 mg po Daily and Memantine  10 mg po BID; Delirium Precautions   Neuromuscular Disorder /Parkinson's: C/w Carbidopa -Levodopa  25-100 1 tab po BID   HLD/ Nonobstructive CAD: C/w ASA 81 mg po Daily and Rosuvastatin  10 mg po at bedtime   Bipolar 1 Disorder/Panic Disorder/GAD/Insomnia w/ Sleep Apnea: C/w Desvenlafaxine 50 mg po Daily, Lamotrigine  150 mg po BID, Trazodone  200 mg po at bedtime;    Seizure Disorder: C/w Lamtorigine 150 mg po BID and Levetiracetam  750 mg po BID. Place on Seizure Precautions and Gabapentin  300-600 mg po Daily   Normocytic Anemia: Hgb/Hct went from 11.2/35.8 -> 9.8/31.6. Checked anemia panel showed an iron  level less than 10, UIBC was not calculated, TIBC of 220, saturation ratios were not calculated, ferritin of 109, folate level of 20.0 and vitamin B12 642.  Initiate Fe Supplementation w/ Niferex 150 mg po Daily. CTM for S/Sx of Bleeding; No overt bleeding noted. Repeat CBC in the AM    GERD/GI Prophylaxis: C/w Pantoprazole  40 mg po Daily.  Hypoalbuminemia: Patient's Albumin  Lvl went from 4.1 -> 3.1. CTM and Trend and repeat CMP in the AM   Class II Obesity: Complicates overall prognosis and care. Estimated body mass index is 38.41 kg/m as calculated from the following:  Height as of 04/18/24: 5' 2 (1.575 m).   Weight as of 04/18/24: 95.3 kg. Weight Loss and Dietary Counseling given   DVT prophylaxis: enoxaparin  (LOVENOX ) injection 40 mg Start: 05/29/24 2200 SCDs Start: 05/29/24 1705    Code Status: Full Code Family Communication: No family present @ Bedside  Disposition Plan:  Level of care: Progressive Status is: Inpatient Remains inpatient appropriate because: Needs further clinical improvement and  evaluation by PT and OT for safe discharge disposition   Consultants:  None  Procedures:  As delineated as above  Antimicrobials:  Anti-infectives (From admission, onward)    Start     Dose/Rate Route Frequency Ordered Stop   05/30/24 1000  cefTRIAXone  (ROCEPHIN ) 2 g in sodium chloride  0.9 % 100 mL IVPB        2 g 200 mL/hr over 30 Minutes Intravenous Every 24 hours 05/30/24 0417     05/29/24 2200  ceFEPIme (MAXIPIME) 2 g in sodium chloride  0.9 % 100 mL IVPB  Status:  Discontinued        2 g 200 mL/hr over 30 Minutes Intravenous Every 12 hours 05/29/24 1433 05/30/24 0417   05/29/24 1315  ceFEPIme (MAXIPIME) 2 g in sodium chloride  0.9 % 100 mL IVPB        2 g 200 mL/hr over 30 Minutes Intravenous  Once 05/29/24 1305 05/29/24 1804       Subjective: Seen and examined at bedside and she is resting and still having some urinary discomfort but states is not as bad.  Feels okay.  Had a temperature overnight.  No other concerns or complaints at this time and had to be placed on supplemental oxygen overnight for comfort but did not actually desaturate..  Objective: Vitals:   05/30/24 0740 05/30/24 1050 05/30/24 1100 05/30/24 1350  BP: (!) 100/54 130/67 (!) 131/55 (!) 105/53  Pulse: 77 95 78 66  Resp: 18 17 (!) 23 16  Temp: 98 F (36.7 C) 98.3 F (36.8 C)  97.8 F (36.6 C)  TempSrc: Oral Oral  Oral  SpO2: 97% 96% 94% 94%    Intake/Output Summary (Last 24 hours) at 05/30/2024 1427 Last data filed at 05/30/2024 1210 Gross per 24 hour  Intake 4154.91 ml  Output --  Net 4154.91 ml   There were no vitals filed for this visit.  Examination: Physical Exam:  Constitutional: WN/WD obese chronically ill-appearing Caucasian female who appears older than her stated age in no acute distress Respiratory: Mildly diminished to auscultation bilaterally, no wheezing, rales, rhonchi or crackles. Normal respiratory effort and patient is not tachypenic. No accessory muscle use.  Unlabored  breathing Cardiovascular: RRR, no murmurs / rubs / gallops. S1 and S2 auscultated. No extremity edema.  Abdomen: Soft, non-tender, distended secondary to body habitus. Bowel sounds positive.  GU: Deferred. Musculoskeletal: No clubbing / cyanosis of digits/nails. No joint deformity upper and lower extremities.  Skin: No rashes, lesions, ulcers on limited skin evaluation. No induration; Warm and dry.  Neurologic: CN 2-12 grossly intact with no focal deficits. Romberg sign and cerebellar reflexes not assessed.  Psychiatric: Normal judgment and insight. Alert and oriented x 3. Normal mood and appropriate affect.   Data Reviewed: I have personally reviewed following labs and imaging studies  CBC: Recent Labs  Lab 05/29/24 1022 05/30/24 0458  WBC 16.6* 10.3  NEUTROABS 14.4* 8.3*  HGB 11.2* 9.8*  HCT 35.8* 31.6*  MCV 93.2 95.5  PLT 212 157   Basic Metabolic Panel: Recent Labs  Lab 05/29/24 1022  05/30/24 0458  NA 133* 136  K 4.4 4.3  CL 98 105  CO2 22 20*  GLUCOSE 136* 107*  BUN 38* 33*  CREATININE 2.07* 1.84*  CALCIUM  9.1 8.1*  MG  --  2.3  PHOS  --  2.8   GFR: CrCl cannot be calculated (Unknown ideal weight.). Liver Function Tests: Recent Labs  Lab 05/29/24 1022 05/30/24 0458  AST 37 31  ALT 16 <5  ALKPHOS 109 90  BILITOT 0.4 0.4  PROT 7.0 5.7*  ALBUMIN  4.1 3.1*   No results for input(s): LIPASE, AMYLASE in the last 168 hours. No results for input(s): AMMONIA in the last 168 hours. Coagulation Profile: Recent Labs  Lab 05/29/24 1022  INR 1.1   Cardiac Enzymes: No results for input(s): CKTOTAL, CKMB, CKMBINDEX, TROPONINI in the last 168 hours. BNP (last 3 results) No results for input(s): PROBNP in the last 8760 hours. HbA1C: No results for input(s): HGBA1C in the last 72 hours. CBG: No results for input(s): GLUCAP in the last 168 hours. Lipid Profile: No results for input(s): CHOL, HDL, LDLCALC, TRIG, CHOLHDL, LDLDIRECT  in the last 72 hours. Thyroid  Function Tests: Recent Labs    05/30/24 0458  TSH 0.925   Anemia Panel: Recent Labs    05/30/24 0458  VITAMINB12 642  FOLATE >20.0  FERRITIN 109  TIBC 228*  IRON  <10*  RETICCTPCT 1.9   Sepsis Labs: Recent Labs  Lab 05/29/24 1134  LATICACIDVEN 1.1   Recent Results (from the past 240 hours)  Blood Culture (routine x 2)     Status: None (Preliminary result)   Collection Time: 05/29/24 10:22 AM   Specimen: BLOOD  Result Value Ref Range Status   Specimen Description   Final    BLOOD RIGHT ANTECUBITAL Performed at [facility], [address], [place], [place] [address]    Special Requests   Final    BOTTLES DRAWN AEROBIC AND ANAEROBIC Blood Culture results may not be optimal due to an inadequate volume of blood received in culture bottles Performed at [facility], [address] [place], [place] [address]    Culture  Setup Time   Final    GRAM NEGATIVE RODS ANAEROBIC BOTTLE ONLY CRITICAL RESULT CALLED TO, READ BACK BY AND VERIFIED WITH: L POINDEXTER PHARMD 05/30/2024 0355 BY DD Performed at [facility], [address] [place], [place] [address]    Culture GRAM NEGATIVE RODS  Final   Report Status PENDING  Incomplete  Blood Culture ID Panel (Reflexed)     Status: Abnormal   Collection Time: 05/29/24 10:22 AM  Result Value Ref Range Status   Enterococcus faecalis NOT DETECTED NOT DETECTED Final   Enterococcus Faecium NOT DETECTED NOT DETECTED Final   Listeria monocytogenes NOT DETECTED NOT DETECTED Final   Staphylococcus species NOT DETECTED NOT DETECTED Final   Staphylococcus aureus (BCID) NOT DETECTED NOT DETECTED Final   Staphylococcus epidermidis NOT DETECTED NOT DETECTED Final   Staphylococcus lugdunensis NOT DETECTED NOT DETECTED Final   Streptococcus species NOT DETECTED NOT DETECTED Final   Streptococcus agalactiae NOT DETECTED NOT DETECTED Final   Streptococcus pneumoniae NOT  DETECTED NOT DETECTED Final   Streptococcus pyogenes NOT DETECTED NOT DETECTED Final   A.calcoaceticus-baumannii NOT DETECTED NOT DETECTED Final   Bacteroides fragilis NOT DETECTED NOT DETECTED Final   Enterobacterales DETECTED (A) NOT DETECTED Final    Comment: Enterobacterales represent a large order of gram negative bacteria, not a single organism. CRITICAL RESULT CALLED  TO, READ BACK BY AND VERIFIED WITH: L POINDEXTER PHARMD 05/30/2024 0355 BY DD    Enterobacter cloacae complex NOT DETECTED NOT DETECTED Final   Escherichia coli DETECTED (A) NOT DETECTED Final    Comment: CRITICAL RESULT CALLED TO, READ BACK BY AND VERIFIED WITH: L POINDEXTER PHARMD 05/30/2024 0355 BY DD    Klebsiella aerogenes NOT DETECTED NOT DETECTED Final   Klebsiella oxytoca NOT DETECTED NOT DETECTED Final   Klebsiella pneumoniae NOT DETECTED NOT DETECTED Final   Proteus species NOT DETECTED NOT DETECTED Final   Salmonella species NOT DETECTED NOT DETECTED Final   Serratia marcescens NOT DETECTED NOT DETECTED Final   Haemophilus influenzae NOT DETECTED NOT DETECTED Final   Neisseria meningitidis NOT DETECTED NOT DETECTED Final   Pseudomonas aeruginosa NOT DETECTED NOT DETECTED Final   Stenotrophomonas maltophilia NOT DETECTED NOT DETECTED Final   Candida albicans NOT DETECTED NOT DETECTED Final   Candida auris NOT DETECTED NOT DETECTED Final   Candida glabrata NOT DETECTED NOT DETECTED Final   Candida krusei NOT DETECTED NOT DETECTED Final   Candida parapsilosis NOT DETECTED NOT DETECTED Final   Candida tropicalis NOT DETECTED NOT DETECTED Final   Cryptococcus neoformans/gattii NOT DETECTED NOT DETECTED Final   CTX-M ESBL NOT DETECTED NOT DETECTED Final   Carbapenem resistance IMP NOT DETECTED NOT DETECTED Final   Carbapenem resistance KPC NOT DETECTED NOT DETECTED Final   Carbapenem resistance NDM NOT DETECTED NOT DETECTED Final   Carbapenem resist OXA 48 LIKE NOT DETECTED NOT DETECTED Final   Carbapenem  resistance VIM NOT DETECTED NOT DETECTED Final    Comment: Performed at [facility], [address], [place], [place] [address]  Blood Culture (routine x 2)     Status: None (Preliminary result)   Collection Time: 05/29/24 10:27 AM   Specimen: BLOOD  Result Value Ref Range Status   Specimen Description   Final    BLOOD BLOOD RIGHT WRIST Performed at [facility], [address], [place], [place] [address]    Special Requests   Final    BOTTLES DRAWN AEROBIC AND ANAEROBIC Blood Culture adequate volume Performed at [facility], [address] [place], [place] [address]    Culture  Setup Time   Final    GRAM NEGATIVE RODS IN BOTH AEROBIC AND ANAEROBIC BOTTLES CRITICAL VALUE NOTED.  VALUE IS CONSISTENT WITH PREVIOUSLY REPORTED AND CALLED VALUE.    Culture   Final    NO GROWTH < 24 HOURS Performed at [facility], [address], [place], [place] [address]    Report Status PENDING  Incomplete    Radiology Studies: DG Chest Port 1 View Result Date: 05/29/2024 EXAM: 1 VIEW(S) XRAY OF THE CHEST 05/29/2024 10:30:51 AM COMPARISON: 01/02/2024 CLINICAL HISTORY: Questionable sepsis - evaluate for abnormality. Questionable sepsis FINDINGS: LINES, TUBES AND DEVICES: Right chest port in place with tip overlying the SVC. LUNGS AND PLEURA: Low lung volumes. No focal pulmonary opacity. No pulmonary edema. No pleural effusion. No pneumothorax. HEART AND MEDIASTINUM: No acute abnormality of the cardiac and mediastinal silhouettes. BONES AND SOFT TISSUES: Remote right rib fracture. IMPRESSION: 1. No acute cardiopulmonary abnormality. 2. Low lung volumes. Electronically signed by: Evalene Coho MD 05/29/2024 10:39 AM EDT RP Workstation: [id number]   Scheduled Meds:  aspirin  EC  81 mg Oral Daily   carbidopa -levodopa   1 tablet Oral BID   donepezil   10 mg Oral Daily   enoxaparin  (LOVENOX ) injection  40 mg Subcutaneous Q24H  gabapentin   300 mg Oral  Daily   gabapentin   600 mg Oral QHS   iron  polysaccharides  150 mg Oral Daily   lamoTRIgine   150 mg Oral BID   levETIRAcetam   750 mg Oral BID   loratadine   10 mg Oral Daily   memantine   10 mg Oral BID   multivitamin with minerals  1 tablet Oral QHS   pantoprazole   40 mg Oral Daily   polyethylene glycol  17 g Oral BID   predniSONE   5 mg Oral Daily   primidone   100 mg Oral BID   rosuvastatin   10 mg Oral QHS   senna  1 tablet Oral BID   tiZANidine   4 mg Oral TID   traZODone   200 mg Oral QHS   venlafaxine XR  75 mg Oral Q breakfast   Continuous Infusions:  cefTRIAXone  (ROCEPHIN )  IV Stopped (05/30/24 1210)   lactated ringers       LOS: 1 day   Alejandro Marker, DO [facility] Available via Epic secure chat 7am-7pm After these hours, please refer to coverage provider listed on [email or web address] 05/30/2024, 2:27 PM

## 2024-05-30 NOTE — Progress Notes (Signed)
 PHARMACY - PHYSICIAN COMMUNICATION CRITICAL VALUE ALERT - BLOOD CULTURE IDENTIFICATION (BCID)  Valerie [name] is an 63 y.o. female who presented to [facility] on 05/29/2024 with sepsis due to suspected UTI.  Assessment:  BCID + E. Coli (no ESBL) in 3 out of 4 bottles  Name of physician (or Provider) Contacted: Lynwood Kipper, NP  Current antibiotics: Cefepime   Changes to prescribed antibiotics recommended:  Recommendations accepted by provider D/C Cefepime Begin Ceftriaxone  2g IV q24h  Results for orders placed or performed during the hospital encounter of 05/29/24  Blood Culture ID Panel (Reflexed) (Collected: 05/29/2024 10:22 AM)  Result Value Ref Range   Enterococcus faecalis NOT DETECTED NOT DETECTED   Enterococcus Faecium NOT DETECTED NOT DETECTED   Listeria monocytogenes NOT DETECTED NOT DETECTED   Staphylococcus species NOT DETECTED NOT DETECTED   Staphylococcus aureus (BCID) NOT DETECTED NOT DETECTED   Staphylococcus epidermidis NOT DETECTED NOT DETECTED   Staphylococcus lugdunensis NOT DETECTED NOT DETECTED   Streptococcus species NOT DETECTED NOT DETECTED   Streptococcus agalactiae NOT DETECTED NOT DETECTED   Streptococcus pneumoniae NOT DETECTED NOT DETECTED   Streptococcus pyogenes NOT DETECTED NOT DETECTED   A.calcoaceticus-baumannii NOT DETECTED NOT DETECTED   Bacteroides fragilis NOT DETECTED NOT DETECTED   Enterobacterales DETECTED (A) NOT DETECTED   Enterobacter cloacae complex NOT DETECTED NOT DETECTED   Escherichia coli DETECTED (A) NOT DETECTED   Klebsiella aerogenes NOT DETECTED NOT DETECTED   Klebsiella oxytoca NOT DETECTED NOT DETECTED   Klebsiella pneumoniae NOT DETECTED NOT DETECTED   Proteus species NOT DETECTED NOT DETECTED   Salmonella species NOT DETECTED NOT DETECTED   Serratia marcescens NOT DETECTED NOT DETECTED   Haemophilus influenzae NOT DETECTED NOT DETECTED   Neisseria meningitidis NOT DETECTED NOT DETECTED   Pseudomonas aeruginosa NOT  DETECTED NOT DETECTED   Stenotrophomonas maltophilia NOT DETECTED NOT DETECTED   Candida albicans NOT DETECTED NOT DETECTED   Candida auris NOT DETECTED NOT DETECTED   Candida glabrata NOT DETECTED NOT DETECTED   Candida krusei NOT DETECTED NOT DETECTED   Candida parapsilosis NOT DETECTED NOT DETECTED   Candida tropicalis NOT DETECTED NOT DETECTED   Cryptococcus neoformans/gattii NOT DETECTED NOT DETECTED   CTX-M ESBL NOT DETECTED NOT DETECTED   Carbapenem resistance IMP NOT DETECTED NOT DETECTED   Carbapenem resistance KPC NOT DETECTED NOT DETECTED   Carbapenem resistance NDM NOT DETECTED NOT DETECTED   Carbapenem resist OXA 48 LIKE NOT DETECTED NOT DETECTED   Carbapenem resistance VIM NOT DETECTED NOT DETECTED    Arvin Gauss, PharmD 05/30/2024  4:18 AM

## 2024-05-31 DIAGNOSIS — B9620 Unspecified Escherichia coli [E. coli] as the cause of diseases classified elsewhere: Secondary | ICD-10-CM | POA: Diagnosis not present

## 2024-05-31 DIAGNOSIS — N179 Acute kidney failure, unspecified: Secondary | ICD-10-CM | POA: Diagnosis not present

## 2024-05-31 DIAGNOSIS — R7881 Bacteremia: Secondary | ICD-10-CM

## 2024-05-31 DIAGNOSIS — N1832 Chronic kidney disease, stage 3b: Secondary | ICD-10-CM

## 2024-05-31 LAB — CBC WITH DIFFERENTIAL/PLATELET
Abs Immature Granulocytes: 0.04 K/uL (ref 0.00–0.07)
Basophils Absolute: 0 K/uL (ref 0.0–0.1)
Basophils Relative: 0 %
Eosinophils Absolute: 0.3 K/uL (ref 0.0–0.5)
Eosinophils Relative: 3 %
HCT: 30.8 % — ABNORMAL LOW (ref 36.0–46.0)
Hemoglobin: 9.2 g/dL — ABNORMAL LOW (ref 12.0–15.0)
Immature Granulocytes: 1 %
Lymphocytes Relative: 11 %
Lymphs Abs: 0.9 K/uL (ref 0.7–4.0)
MCH: 28.8 pg (ref 26.0–34.0)
MCHC: 29.9 g/dL — ABNORMAL LOW (ref 30.0–36.0)
MCV: 96.3 fL (ref 80.0–100.0)
Monocytes Absolute: 1.1 K/uL — ABNORMAL HIGH (ref 0.1–1.0)
Monocytes Relative: 14 %
Neutro Abs: 5.7 K/uL (ref 1.7–7.7)
Neutrophils Relative %: 71 %
Platelets: 168 K/uL (ref 150–400)
RBC: 3.2 MIL/uL — ABNORMAL LOW (ref 3.87–5.11)
RDW: 15.7 % — ABNORMAL HIGH (ref 11.5–15.5)
WBC: 8 K/uL (ref 4.0–10.5)
nRBC: 0 % (ref 0.0–0.2)

## 2024-05-31 LAB — PHOSPHORUS: Phosphorus: 2.6 mg/dL (ref 2.5–4.6)

## 2024-05-31 LAB — URINE CULTURE: Culture: >=100000 — AB

## 2024-05-31 LAB — COMPREHENSIVE METABOLIC PANEL WITH GFR
ALT: 8 U/L (ref 0–44)
AST: 78 U/L — ABNORMAL HIGH (ref 15–41)
Albumin: 3.2 g/dL — ABNORMAL LOW (ref 3.5–5.0)
Alkaline Phosphatase: 133 U/L — ABNORMAL HIGH (ref 38–126)
Anion gap: 10 (ref 5–15)
BUN: 28 mg/dL — ABNORMAL HIGH (ref 8–23)
CO2: 22 mmol/L (ref 22–32)
Calcium: 8.4 mg/dL — ABNORMAL LOW (ref 8.9–10.3)
Chloride: 106 mmol/L (ref 98–111)
Creatinine, Ser: 1.65 mg/dL — ABNORMAL HIGH (ref 0.44–1.00)
GFR, Estimated: 34 mL/min — ABNORMAL LOW (ref >60)
Glucose, Bld: 95 mg/dL (ref 70–99)
Potassium: 4.3 mmol/L (ref 3.5–5.1)
Sodium: 138 mmol/L (ref 135–145)
Total Bilirubin: 0.3 mg/dL (ref 0.0–1.2)
Total Protein: 5.7 g/dL — ABNORMAL LOW (ref 6.5–8.1)

## 2024-05-31 LAB — GLUCOSE, CAPILLARY: Glucose-Capillary: 127 mg/dL — ABNORMAL HIGH (ref 70–99)

## 2024-05-31 LAB — MAGNESIUM: Magnesium: 2.3 mg/dL (ref 1.7–2.4)

## 2024-05-31 MED ORDER — TIZANIDINE HCL 4 MG PO TABS
4.0000 mg | ORAL_TABLET | Freq: Three times a day (TID) | ORAL | Status: DC | PRN
  Administered 2024-06-01: 4 mg via ORAL
  Filled 2024-05-31: qty 1

## 2024-05-31 MED ORDER — LACTATED RINGERS IV SOLN: INTRAVENOUS | Status: AC

## 2024-05-31 MED ORDER — LORATADINE 10 MG PO TABS: 10.0000 mg | ORAL_TABLET | Freq: Every day | ORAL | Status: DC | PRN

## 2024-05-31 NOTE — Evaluation (Signed)
 Occupational Therapy Evaluation Patient Details Name: Valerie [name] MRN: [id number] DOB: 1960-09-08 Today's Date: 05/31/2024   History of Present Illness   Valerie [name] is a 63 y.o. female w/ PMH significant for multiple medical comorbidities including arthritis, anxiety, Bipolar 1 disorder, CKD stage IIIb, history of colovesicular fistula, dementia, depression, DMT2, HTN, hypothyroidism neuromuscular disorder, history of sleep apnea and history of TIA/CVA and other comorbidities who presents with progressive weakness and fall x 2 as well as burning and discomfort in her urine.  Further workup reveals that she was septic secondary to E. coli Bacteremia and suspected UTI     Clinical Impressions Pt presents with decline in function and safety with ADLs and ADL mobility with impaired strength, balance and endurance. PTA pt lives alone and was Ind with ADLs, home mgt, cooking and used RW for mobility, does not drive and pt is a retired Engineer, civil (consulting). Pt currently requires mod A to sit EOB, max-total A with LB ADLs, total  with toileting, mod/min A with STS/SPTs. Pt's HR and O2 SATs WNL throughout activity. OT will follow pt acutely to maximize level of function and safety      If plan is discharge home, recommend the following:   A lot of help with bathing/dressing/bathroom;A lot of help with walking and/or transfers;Assistance with cooking/housework;Assist for transportation;Help with stairs or ramp for entrance     Functional Status Assessment   Patient has had a recent decline in their functional status and demonstrates the ability to make significant improvements in function in a reasonable and predictable amount of time.     Equipment Recommendations   None recommended by OT     Recommendations for Other Services         Precautions/Restrictions   Precautions Precautions: Fall Restrictions Weight Bearing Restrictions Per Provider Order: No     Mobility Bed  Mobility Overal bed mobility: Needs Assistance Bed Mobility: Supine to Sit, Sit to Supine     Supine to sit: Mod assist, HOB elevated, Used rails Sit to supine: Min assist   General bed mobility comments: increased time    Transfers Overall transfer level: Needs assistance Equipment used: Rolling walker (2 wheels) Transfers: Sit to/from Stand, Bed to chair/wheelchair/BSC Sit to Stand: Mod assist Stand pivot transfers: Min assist                Balance Overall balance assessment: Needs assistance Sitting-balance support: No upper extremity supported, Feet supported Sitting balance-Leahy Scale: Fair     Standing balance support: Bilateral upper extremity supported, During functional activity Standing balance-Leahy Scale: Poor                             ADL either performed or assessed with clinical judgement   ADL Overall ADL's : Needs assistance/impaired Eating/Feeding: Independent   Grooming: Wash/dry hands;Wash/dry face;Contact guard assist;Sitting   Upper Body Bathing: Contact guard assist;Sitting   Lower Body Bathing: Maximal assistance   Upper Body Dressing : Contact guard assist;Sitting   Lower Body Dressing: Maximal assistance   Toilet Transfer: Moderate assistance;Minimal assistance;Rolling walker (2 wheels);Stand-pivot;BSC/3in1;Cueing for safety   Toileting- Clothing Manipulation and Hygiene: Total assistance       Functional mobility during ADLs: Moderate assistance;Minimal assistance;Rolling walker (2 wheels);Cueing for safety       Vision Baseline Vision/History: 1 Wears glasses Ability to See in Adequate Light: 0 Adequate Patient Visual Report: No change from baseline       Perception  Praxis         Pertinent Vitals/Pain Pain Assessment Pain Assessment: Faces Faces Pain Scale: Hurts little more Pain Location: back (chronic) Pain Descriptors / Indicators: Aching, Discomfort Pain Intervention(s): Monitored  during session, Repositioned     Extremity/Trunk Assessment Upper Extremity Assessment Upper Extremity Assessment: Overall WFL for tasks assessed;Generalized weakness   Lower Extremity Assessment Lower Extremity Assessment: Defer to PT evaluation       Communication Communication Communication: No apparent difficulties   Cognition Arousal: Alert Behavior During Therapy: WFL for tasks assessed/performed Cognition: No apparent impairments                               Following commands: Intact       Cueing  General Comments   Cueing Techniques: Verbal cues      Exercises     Shoulder Instructions      Home Living Family/patient expects to be discharged to:: Private residence Living Arrangements: Alone Available Help at Discharge: Family;Available 24 hours/day Type of Home: House Home Access: Stairs to enter [employer] of Steps: 1 Entrance Stairs-Rails: None Home Layout: Two level;1/2 bath on main level;Able to live on main level with bedroom/bathroom Alternate Level Stairs-Number of Steps: 1 flight Alternate Level Stairs-Rails: Left Bathroom Shower/Tub: Walk-in Human resources officer: Standard Bathroom Accessibility: No   Home Equipment: Agricultural consultant (2 wheels);BSC/3in1;Cane - single point;Rollator (4 wheels);Grab bars - tub/shower;Shower seat;Wheelchair - manual   Additional Comments: sister and friends can assist as needed      Prior Functioning/Environment Prior Level of Function : Independent/Modified Independent;History of Falls (last six months)             Mobility Comments: using RW since May ADLs Comments: independent ADLs, light IADLs, grocery delivery, no longer drives    OT Problem List: Decreased strength;Decreased activity tolerance;Impaired balance (sitting and/or standing)   OT Treatment/Interventions: Self-care/ADL training;Therapeutic exercise;Therapeutic activities;DME and/or AE  instruction;Patient/family education;Balance training      OT Goals(Current goals can be found in the care plan section)   Acute Rehab OT Goals Patient Stated Goal: go to rehab then go home OT Goal Formulation: With patient Time For Goal Achievement: 06/14/24 Potential to Achieve Goals: Good ADL Goals Pt Will Perform Grooming: with supervision;with set-up;sitting Pt Will Perform Upper Body Bathing: with supervision;with set-up;sitting Pt Will Perform Lower Body Bathing: with mod assist Pt Will Perform Upper Body Dressing: with supervision;with set-up;sitting Pt Will Transfer to Toilet: with min assist;with contact guard assist;ambulating;bedside commode Pt Will Perform Toileting - Clothing Manipulation and hygiene: with max assist;with mod assist;sitting/lateral leans;sit to/from stand   OT Frequency:  Min 2X/week    Co-evaluation              AM-PAC OT 6 Clicks Daily Activity     Outcome Measure Help from another person eating meals?: None Help from another person taking care of personal grooming?: A Little Help from another person toileting, which includes using toliet, bedpan, or urinal?: Total Help from another person bathing (including washing, rinsing, drying)?: A Lot Help from another person to put on and taking off regular upper body clothing?: A Little Help from another person to put on and taking off regular lower body clothing?: Total 6 Click Score: 14   End of Session Equipment Utilized During Treatment: Gait belt;Rolling walker (2 wheels);Other (comment) [facility]) Nurse Communication: Mobility status  Activity Tolerance: Patient limited by fatigue Patient left: in bed;with call  bell/phone within reach  OT Visit Diagnosis: Other abnormalities of gait and mobility (R26.89);Unsteadiness on feet (R26.81);Muscle weakness (generalized) (M62.81)                Time: 8955-8877 OT Time Calculation (min): 38 min Charges:  OT General Charges $OT Visit: 1 Visit OT  Evaluation $OT Eval Moderate Complexity: 1 Mod OT Treatments $Self Care/Home Management : 8-22 mins $Therapeutic Activity: 8-22 mins    Jacques Karna Loose 05/31/2024, 12:49 PM

## 2024-05-31 NOTE — TOC Initial Note (Signed)
 Transition of Care [facility]) - Initial/Assessment Note    Patient Details  Name: Valerie [name] MRN: [id number] Date of Birth: 08-08-1961  Transition of Care [facility]) CM/SW Contact:    Jon ONEIDA Anon, RN Phone Number: 05/31/2024, 3:26 PM  Clinical Narrative:                 Pt is from home. Needing continued medical workup, not medically ready to discharge. OT rec SNF, awaiting PT evaluation. ICM will continue to follow for DC needs.      Expected Discharge Plan: Skilled Nursing Facility Barriers to Discharge: Continued Medical Work up   Patient Goals and CMS Choice Patient states their goals for this hospitalization and ongoing recovery are:: Return home CMS Medicare.gov Compare Post Acute Care list provided to:: Patient Choice offered to / list presented to : Patient  ownership interest in [facility].provided to:: Patient    Expected Discharge Plan and Services In-house Referral: NA Discharge Planning Services: CM Consult Post Acute Care Choice: Skilled Nursing Facility Living arrangements for the past 2 months: Apartment                 DME Arranged: N/A DME Agency: NA       HH Arranged: NA HH Agency: NA        Prior Living Arrangements/Services Living arrangements for the past 2 months: Apartment Lives with:: Self Patient language and need for interpreter reviewed:: Yes Do you feel safe going back to the place where you live?: Yes      Need for Family Participation in Patient Care: Yes (Comment) Care giver support system in place?: Yes (comment)   Criminal Activity/Legal Involvement Pertinent to Current Situation/Hospitalization: No - Comment as needed  Activities of Daily Living   ADL Screening (condition at time of admission) Independently performs ADLs?: No Does the patient have a NEW difficulty with bathing/dressing/toileting/self-feeding that is expected to last >3 days?: Yes (Initiates electronic notice to provider for possible OT  consult) Does the patient have a NEW difficulty with getting in/out of bed, walking, or climbing stairs that is expected to last >3 days?: Yes (Initiates electronic notice to provider for possible PT consult) Does the patient have a NEW difficulty with communication that is expected to last >3 days?: No Is the patient deaf or have difficulty hearing?: No Does the patient have difficulty seeing, even when wearing glasses/contacts?: No Does the patient have difficulty concentrating, remembering, or making decisions?: No  Permission Sought/Granted Permission sought to share information with : Family Supports Permission granted to share information with : Yes, Verbal Permission Granted  Share Information with NAME: Beauty Ramp (Sister)  [phone]           Emotional Assessment       Orientation: : Oriented to Self, Oriented to Place, Oriented to  Time, Oriented to Situation Alcohol / Substance Use: Not Applicable Psych Involvement: No (comment)  Admission diagnosis:  Sepsis (HCC) [A41.9] Severe sepsis (HCC) [A41.9, R65.20] Patient Active Problem List   Diagnosis Date Noted   Sepsis (HCC) 05/29/2024   Acute postoperative anemia due to expected blood loss 01/04/2024   Periprosthetic fracture around internal prosthetic right knee joint 01/02/2024   Primary localized osteoarthritis of right knee 10/19/2023   Seizure disorder (HCC) 07/28/2023   Parkinson disease (HCC) 07/28/2023   Aortic atherosclerosis 07/28/2023   Primary osteoarthritis involving multiple joints 06/20/2023   Long term (current) use of systemic steroids 06/20/2023   High risk medication use 05/12/2023  Vitamin D  deficiency 05/12/2023   Tremor 02/11/2023   Fall 02/11/2023   Candidal skin infection 12/04/2022   Urinary incontinence 12/04/2022   GERD (gastroesophageal reflux disease) 10/22/2022   Hyperlipidemia associated with type 2 diabetes mellitus (HCC) 10/22/2022   Hyperparathyroidism 12/03/2020   Multiple  thyroid  nodules 12/03/2020   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   Acute kidney injury superimposed on stage 3b chronic kidney disease (HCC)    Anemia, chronic disease 07/18/2020   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 [place] [address] Phone: [phone] Fax: [phone]  [facility]) - [place], [place] - [address] [facility] [address] [place] [address] [place] [address] Phone: [phone] Fax: [phone]     Social Drivers of Health (SDOH) Social History: SDOH Screenings   Food Insecurity: No Food Insecurity (05/30/2024)  Housing: Low Risk  (05/30/2024)  Transportation Needs: No Transportation Needs (05/30/2024)  Utilities: Not At Risk (05/30/2024)  Alcohol Screen: Low Risk  (12/04/2022)  Depression (PHQ2-9): Medium Risk (09/21/2023)  Financial Resource Strain: High Risk (01/11/2023)  Physical Activity: Inactive (09/21/2023)  Social Connections: Socially Isolated (10/21/2023)  Stress: Stress Concern Present (09/21/2023)  Tobacco Use: Medium Risk (05/29/2024)   SDOH Interventions:     Readmission Risk Interventions    05/31/2024    3:22 PM  Readmission Risk Prevention Plan  Transportation Screening Complete  Medication Review (RN Care Manager) Complete  PCP or Specialist  appointment within 3-5 days of discharge Complete  HRI or Home Care Consult Complete  SW Recovery Care/Counseling Consult Complete  Palliative Care Screening Not Applicable  Skilled Nursing Facility Complete

## 2024-05-31 NOTE — Evaluation (Signed)
 Physical Therapy Evaluation Patient Details Name: Valerie [name] MRN: [id number] DOB: 1960/11/17 Today's Date: 05/31/2024  History of Present Illness  63 y.o. female admitted with sepsis 2* bacteremia/UTI, weakness, fall. PMH significant for multiple medical comorbidities including arthritis, anxiety, Bipolar 1 disorder, CKD stage IIIb, history of colovesicular fistula, dementia, depression, DMT2, HTN, hypothyroidism, Parkinson's/neuromuscular disorder, history of sleep apnea and history of TIA/CVA.  Clinical Impression  On eval, pt required Mod A for mobility. She was able to stand and take side steps along bedside with a RW. She reports fatigue and requested to return to bed. Pt presents with general weakness, decreased activity tolerance, and impaired gait/balance.Patient will benefit from continued inpatient follow up therapy, <3 hours/day, if pt/family are agreeable. Will continue to follow and progress activity as tolerated.         If plan is discharge home, recommend the following: A little help with walking and/or transfers;A little help with bathing/dressing/bathroom;Assistance with cooking/housework;Assist for transportation;Help with stairs or ramp for entrance   Can travel by private vehicle        Equipment Recommendations None recommended by PT  Recommendations for Other Services       Functional Status Assessment Patient has had a recent decline in their functional status and demonstrates the ability to make significant improvements in function in a reasonable and predictable amount of time.     Precautions / Restrictions Precautions Precautions: Fall Restrictions Weight Bearing Restrictions Per Provider Order: No      Mobility  Bed Mobility Overal bed mobility: Needs Assistance Bed Mobility: Supine to Sit, Sit to Supine     Supine to sit: Min assist, HOB elevated, Used rails Sit to supine: Min assist, HOB elevated, Used rails   General bed mobility comments:  increased time. assist for trunk and to scoot to EOB. Cues for safety.    Transfers Overall transfer level: Needs assistance Equipment used: Rolling walker (2 wheels) Transfers: Sit to/from Stand Sit to Stand: Mod assist, From elevated surface           General transfer comment: Assist to rise, steady, control descent. Cues for safety, technique, hand placement. Increased time    Ambulation/Gait Ambulation/Gait assistance: Min assist   Assistive device: Rolling walker (2 wheels)         General Gait Details: Side steps along bedside with RW. Min A to steady pt and manage RW. Cues for safety, technique.  Stairs            Wheelchair Mobility     Tilt Bed    Modified Rankin (Stroke Patients Only)       Balance Overall balance assessment: Needs assistance         Standing balance support: Bilateral upper extremity supported, During functional activity Standing balance-Leahy Scale: Poor                               Pertinent Vitals/Pain Pain Assessment Pain Assessment: Faces Faces Pain Scale: Hurts little more Pain Location: back (chronic) Pain Descriptors / Indicators: Aching, Discomfort Pain Intervention(s): Limited activity within patient's tolerance, Monitored during session, Repositioned    Home Living Family/patient expects to be discharged to:: Skilled nursing facility Living Arrangements: Alone Available Help at Discharge: Family;Available 24 hours/day Type of Home: House Home Access: Stairs to enter Entrance Stairs-Rails: None Entrance Stairs-Number of Steps: 1 Alternate Level Stairs-Number of Steps: 1 flight Home Layout: Two level;1/2 bath on main level;Able to live on  main level with bedroom/bathroom Home Equipment: Rolling Walker (2 wheels);BSC/3in1;Cane - single point;Rollator (4 wheels);Grab bars - tub/shower;Shower seat;Wheelchair - manual Additional Comments: sister and friends can assist as needed    Prior Function  Prior Level of Function : Independent/Modified Independent;History of Falls (last six months)             Mobility Comments: using RW since May ADLs Comments: independent ADLs, light IADLs, grocery delivery, no longer drives     Extremity/Trunk Assessment   Upper Extremity Assessment Upper Extremity Assessment: Defer to OT evaluation    Lower Extremity Assessment Lower Extremity Assessment: Generalized weakness    Cervical / Trunk Assessment Cervical / Trunk Assessment: Normal  Communication   Communication Communication: No apparent difficulties    Cognition Arousal: Alert Behavior During Therapy: WFL for tasks assessed/performed   PT - Cognitive impairments: No apparent impairments                         Following commands: Intact       Cueing Cueing Techniques: Verbal cues     General Comments      Exercises     Assessment/Plan    PT Assessment Patient needs continued PT services  PT Problem List Decreased strength;Decreased range of motion;Decreased activity tolerance;Decreased balance;Decreased mobility;Decreased knowledge of use of DME;Pain       PT Treatment Interventions DME instruction;Gait training;Functional mobility training;Therapeutic activities;Therapeutic exercise;Patient/family education;Balance training    PT Goals (Current goals can be found in the Care Plan section)  Acute Rehab PT Goals Patient Stated Goal: to regain PLOF/independence PT Goal Formulation: With patient Time For Goal Achievement: 06/14/24 Potential to Achieve Goals: Good    Frequency Min 2X/week     Co-evaluation               AM-PAC PT 6 Clicks Mobility  Outcome Measure Help needed turning from your back to your side while in a flat bed without using bedrails?: A Little Help needed moving from lying on your back to sitting on the side of a flat bed without using bedrails?: A Lot Help needed moving to and from a bed to a chair (including a  wheelchair)?: A Little Help needed standing up from a chair using your arms (e.g., wheelchair or bedside chair)?: A Lot Help needed to walk in hospital room?: A Little Help needed climbing 3-5 steps with a railing? : Total 6 Click Score: 14    End of Session Equipment Utilized During Treatment: Gait belt Activity Tolerance: Patient tolerated treatment well Patient left: in bed;with call bell/phone within reach;with bed alarm set   PT Visit Diagnosis: Muscle weakness (generalized) (M62.81);Difficulty in walking, not elsewhere classified (R26.2)    Time: 8453-8398 PT Time Calculation (min) (ACUTE ONLY): 15 min   Charges:   PT Evaluation $PT Eval Low Complexity: 1 Low   PT General Charges $$ ACUTE PT VISIT: 1 Visit          Dannial SQUIBB, PT Acute Rehabilitation  Office: [phone]

## 2024-05-31 NOTE — Progress Notes (Signed)
  Progress Note   [name] FMW:[id number] DOB: [birth year] DOA: 05/29/2024     2 DOS: the patient was seen and examined on 05/31/2024   Brief hospital course:   Assessment and Plan: Sepsis  E Coli Bacteremia and UTI  Sepsis resolving.  Continue antibiotics.    AKI  CKD Stage IIIb  Baseline creatinine around 1.3.  Creatinine on admission 2.07.  Creatinine trending down.  Check BMP in AM.   Hyponatremia Resolved.   Alzheimer's Dementia Stable.  Donepezil  and Memantine  Delirium precautions   Neuromuscular Disorder  Parkinson's  Continue Carbidopa -Levodopa    HLD/ Nonobstructive CAD Continue aspirin , statin   Bipolar 1 Disorder Panic Disorder GAD Insomnia  Sleep Apnea  Desvenlafaxine, Lamotrigine , Trazodone     Seizure Disorder Continue Lamtorigine and Levetiracetam  and  Gabapentin      Normocytic Anemia Stable   GERD/GI Prophylaxis Pantoprazole     Class II Obesity Body mass index is 41.57 kg/m.   Fall   Overall seems to be improving    Subjective:  Confused a bit  Physical Exam: Vitals:   05/31/24 0729 05/31/24 1231 05/31/24 1250 05/31/24 1800  BP: 111/65 (!) 90/57 (!) 113/54 121/61  Pulse: 71 (!) 57    Resp:  18  16  Temp: 98.5 F (36.9 C) 98.4 F (36.9 C)    TempSrc: Oral Oral    SpO2: 96% 97%    Weight:      Height:       Physical Exam Vitals reviewed.  Constitutional:      General: She is not in acute distress.    Appearance: She is not ill-appearing or toxic-appearing.  Cardiovascular:     Rate and Rhythm: Normal rate and regular rhythm.     Heart sounds: No murmur heard. Pulmonary:     Effort: Pulmonary effort is normal. No respiratory distress.     Breath sounds: No wheezing, rhonchi or rales.  Neurological:     Mental Status: She is alert.  Psychiatric:        Mood and Affect: Mood normal.        Behavior: Behavior normal.     Data Reviewed: Creatinine 1.65, improved Hgb stable 9.2  Family Communication:  sister at bedside  Disposition: Status is: Inpatient Remains inpatient appropriate because: see above     Time spent: 35 minutes  Author: Toribio Door, MD 05/31/2024 7:05 PM  For on call review [email or web address].

## 2024-06-01 DIAGNOSIS — N1832 Chronic kidney disease, stage 3b: Secondary | ICD-10-CM | POA: Diagnosis not present

## 2024-06-01 DIAGNOSIS — R7881 Bacteremia: Secondary | ICD-10-CM | POA: Diagnosis not present

## 2024-06-01 DIAGNOSIS — B9620 Unspecified Escherichia coli [E. coli] as the cause of diseases classified elsewhere: Secondary | ICD-10-CM | POA: Diagnosis not present

## 2024-06-01 DIAGNOSIS — N179 Acute kidney failure, unspecified: Secondary | ICD-10-CM | POA: Diagnosis not present

## 2024-06-01 LAB — CULTURE, BLOOD (ROUTINE X 2)
Culture: NO GROWTH — AB
Special Requests: ADEQUATE

## 2024-06-01 LAB — BASIC METABOLIC PANEL WITH GFR
Anion gap: 8 (ref 5–15)
BUN: 22 mg/dL (ref 8–23)
CO2: 26 mmol/L (ref 22–32)
Calcium: 8.9 mg/dL (ref 8.9–10.3)
Chloride: 107 mmol/L (ref 98–111)
Creatinine, Ser: 1.42 mg/dL — ABNORMAL HIGH (ref 0.44–1.00)
GFR, Estimated: 41 mL/min — ABNORMAL LOW (ref >60)
Glucose, Bld: 83 mg/dL (ref 70–99)
Potassium: 4.1 mmol/L (ref 3.5–5.1)
Sodium: 142 mmol/L (ref 135–145)

## 2024-06-01 MED ORDER — OXYBUTYNIN CHLORIDE 5 MG PO TABS
5.0000 mg | ORAL_TABLET | Freq: Two times a day (BID) | ORAL | Status: DC
  Administered 2024-06-01 – 2024-06-05 (×9): 5 mg via ORAL
  Filled 2024-06-01 (×9): qty 1

## 2024-06-01 NOTE — Progress Notes (Signed)
  Progress Note   [name] FMW:[id number] DOB: 1961/03/30 DOA: 05/29/2024     3 DOS: the patient was seen and examined on 06/01/2024   Brief hospital course:   Assessment and Plan: Sepsis  E coli bacteremia and UTI  Sepsis resolving.  Much improved.  Discussed with pharmacy, will need to treat total 7 days ceftriaxone .      AKI  CKD Stage IIIb  Baseline creatinine around 1.3.  Creatinine on admission 2.07.  Creatinine trending down, close to baseline.   Hyponatremia Resolved.   Alzheimer's Dementia Stable.  Donepezil  and Memantine  Delirium precautions   Neuromuscular Disorder  Parkinson's  Continue Carbidopa -Levodopa    HLD/ Nonobstructive CAD Continue aspirin , statin   Bipolar 1 Disorder Panic Disorder GAD Insomnia  Sleep Apnea  Desvenlafaxine, Lamotrigine , Trazodone     Seizure Disorder Continue Lamtorigine and Levetiracetam  and  Gabapentin      Normocytic Anemia Stable   GERD/GI Prophylaxis Pantoprazole     Class II Obesity Body mass index is 41.57 kg/m.     Fall    Improving markedly.  Likely home next 1 to 2 days.      Subjective:  Feels much better   Physical Exam: Vitals:   05/31/24 2018 06/01/24 0436 06/01/24 0825 06/01/24 1224  BP: (!) 124/56 130/69 126/68 121/67  Pulse: 64 83  71  Resp: 17 16 18 16   Temp: 98.1 F (36.7 C) 98.4 F (36.9 C)    TempSrc: Oral Oral    SpO2: 100% 95%  92%  Weight:      Height:       Physical Exam Vitals reviewed.  Constitutional:      General: She is not in acute distress.    Appearance: She is not ill-appearing or toxic-appearing.  Cardiovascular:     Rate and Rhythm: Normal rate and regular rhythm.     Heart sounds: No murmur heard. Pulmonary:     Effort: Pulmonary effort is normal. No respiratory distress.     Breath sounds: No wheezing, rhonchi or rales.  Neurological:     Mental Status: She is alert.  Psychiatric:        Mood and Affect: Mood normal.        Behavior: Behavior  normal.     Data Reviewed: Creatinine down to 1.42  Family Communication: none  Disposition: Status is: Inpatient Remains inpatient appropriate because: infection     Time spent: 20 minutes  Author: Toribio Door, MD 06/01/2024 7:58 PM  For on call review [email or web address].

## 2024-06-02 DIAGNOSIS — B9620 Unspecified Escherichia coli [E. coli] as the cause of diseases classified elsewhere: Secondary | ICD-10-CM | POA: Diagnosis not present

## 2024-06-02 DIAGNOSIS — N179 Acute kidney failure, unspecified: Secondary | ICD-10-CM | POA: Diagnosis not present

## 2024-06-02 DIAGNOSIS — N1832 Chronic kidney disease, stage 3b: Secondary | ICD-10-CM | POA: Diagnosis not present

## 2024-06-02 DIAGNOSIS — R7881 Bacteremia: Secondary | ICD-10-CM | POA: Diagnosis not present

## 2024-06-02 LAB — BASIC METABOLIC PANEL WITH GFR
Anion gap: 10 (ref 5–15)
BUN: 16 mg/dL (ref 8–23)
CO2: 30 mmol/L (ref 22–32)
Calcium: 9.6 mg/dL (ref 8.9–10.3)
Chloride: 103 mmol/L (ref 98–111)
Creatinine, Ser: 1.33 mg/dL — ABNORMAL HIGH (ref 0.44–1.00)
GFR, Estimated: 44 mL/min — ABNORMAL LOW (ref >60)
Glucose, Bld: 95 mg/dL (ref 70–99)
Potassium: 4.2 mmol/L (ref 3.5–5.1)
Sodium: 143 mmol/L (ref 135–145)

## 2024-06-02 MED ORDER — SODIUM CHLORIDE 0.9% FLUSH
10.0000 mL | Freq: Two times a day (BID) | INTRAVENOUS | Status: DC
  Administered 2024-06-02: 10 mL
  Administered 2024-06-03 – 2024-06-04 (×2): 30 mL

## 2024-06-02 MED ORDER — CHLORHEXIDINE GLUCONATE CLOTH 2 % EX PADS
6.0000 | MEDICATED_PAD | Freq: Every day | CUTANEOUS | Status: DC
  Administered 2024-06-02 – 2024-06-04 (×3): 6 via TOPICAL

## 2024-06-02 MED ORDER — SODIUM CHLORIDE 0.9% FLUSH: 10.0000 mL | INTRAVENOUS | Status: DC | PRN

## 2024-06-02 NOTE — Progress Notes (Signed)
 PT Cancellation Note  Patient Details Name: Ashelynn Marks MRN: [id number] DOB: 1960/12/23   Cancelled Treatment:    Reason Eval/Treat Not Completed: Pain limiting ability to participate (pt reports she has a migraine, she is awaiting her medication for that. Pt is eager to participate in PT, will check back this afternoon.)   Sylvan Delon Copp PT 06/02/2024  Acute Rehabilitation Services  Office [phone]

## 2024-06-02 NOTE — TOC Progression Note (Signed)
 Transition of Care [facility]) - Progression Note    Patient Details  Name: Valerie [name] MRN: [id number] Date of Birth: 03/19/1961  Transition [facility]) CM/SW Contact  Tawni [name], LCSW Phone Number: 06/02/2024, 11:13 AM  Clinical Narrative:     Csw met with pt at bedside to discuss rec for SNF placement. Pt is agreeable and would like [facility]. CSW has explained the processes noting pt will need insurance authorization. Pt is requesting CSW to provided  an update to her daughter. Care management to follow.  Expected Discharge Plan: Skilled Nursing Facility Barriers to Discharge: Continued Medical Work up               Expected Discharge Plan and Services In-house Referral: NA Discharge Planning Services: CM Consult Post Acute Care Choice: Skilled Nursing Facility Living arrangements for the past 2 months: Apartment                 DME Arranged: N/A DME Agency: NA       HH Arranged: NA HH Agency: NA         Social Drivers of Health (SDOH) Interventions SDOH Screenings   Food Insecurity: No Food Insecurity (05/30/2024)  Housing: Low Risk  (05/30/2024)  Transportation Needs: No Transportation Needs (05/30/2024)  Utilities: Not At Risk (05/30/2024)  Alcohol Screen: Low Risk  (12/04/2022)  Depression (PHQ2-9): Medium Risk (09/21/2023)  Financial Resource Strain: High Risk (01/11/2023)  Physical Activity: Inactive (09/21/2023)  Social Connections: Socially Isolated (10/21/2023)  Stress: Stress Concern Present (09/21/2023)  Tobacco Use: Medium Risk (05/29/2024)    Readmission Risk Interventions    05/31/2024    3:22 PM  Readmission Risk Prevention Plan  Transportation Screening Complete  Medication Review (RN Care Manager) Complete  PCP or Specialist appointment within 3-5 days of discharge Complete  HRI or Home Care Consult Complete  SW Recovery Care/Counseling Consult Complete  Palliative Care Screening Not Applicable  Skilled Nursing Facility  Complete

## 2024-06-02 NOTE — NC FL2 (Signed)
 [place]  MEDICAID [id number] LEVEL OF CARE FORM     IDENTIFICATION  Patient Name: Valerie [name] Birthdate: [birth year] Sex: female Admission Date (Current Location): 05/29/2024  [facility] and [place] Number:  Producer, television/film/video and Address:  [facility],  [address], [place] [address]      Provider Number: [id number]  Attending Physician Name and Address:  Jadine Toribio SQUIBB, MD  Relative Name and Phone Number:  Marsa Mungo (Daughter)  [phone] (Mobile)    Current Level of Care: Hospital Recommended Level of Care: Skilled Nursing Facility Prior Approval Number:    Date Approved/Denied:   PASRR Number: Pending  Discharge Plan: SNF    Current Diagnoses: Patient Active Problem List   Diagnosis Date Noted   Sepsis (HCC) 05/29/2024   Acute postoperative anemia due to expected blood loss 01/04/2024   Periprosthetic fracture around internal prosthetic right knee joint 01/02/2024   Primary localized osteoarthritis of right knee 10/19/2023   Seizure disorder (HCC) 07/28/2023   Parkinson disease (HCC) 07/28/2023   Aortic atherosclerosis 07/28/2023   Primary osteoarthritis involving multiple joints 06/20/2023   Long term (current) use of systemic steroids 06/20/2023   High risk medication use 05/12/2023   Vitamin D  deficiency 05/12/2023   Tremor 02/11/2023   Fall 02/11/2023   Candidal skin infection 12/04/2022   Urinary incontinence 12/04/2022   GERD (gastroesophageal reflux disease) 10/22/2022   Hyperlipidemia associated with type 2 diabetes mellitus (HCC) 10/22/2022   Hyperparathyroidism 12/03/2020   Multiple thyroid  nodules 12/03/2020   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   Acute kidney injury superimposed on stage 3b chronic kidney disease (HCC)    Anemia, chronic disease 07/18/2020   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 07/18/2020   CAD (coronary artery disease) 06/04/2020   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 05/20/2020   DM type 2 (diabetes mellitus, type 2) (HCC) 11/14/2019   Anxiety    Obesity, Class II, BMI 35-39.9    Chronic kidney disease, stage 3b (HCC) - baseline Scr 1.4    Psoriatic arthritis (HCC)    Tobacco abuse    OSA (obstructive sleep apnea)    Neuropathy    Bipolar disorder (HCC)    Osteoarthritis of right hip 11/05/2014   Essential hypertension 10/29/2014    Orientation RESPIRATION BLADDER Height & Weight     Self, Time, Situation, Place  Normal Continent Weight: 227 lb 4.7 oz (103.1 kg) (bed) Height:  5' 2 (157.5 cm)  BEHAVIORAL SYMPTOMS/MOOD NEUROLOGICAL BOWEL NUTRITION STATUS      Continent Diet (regular)  AMBULATORY STATUS COMMUNICATION OF NEEDS Skin   Limited Assist Verbally Other (Comment) (Irritant contact Dermatitis Groin)                       Personal Care Assistance Level of Assistance  Bathing, Dressing, Feeding Bathing Assistance: Limited assistance Feeding assistance: Independent Dressing Assistance: Limited assistance     Functional Limitations Info  Sight, Hearing, Speech Sight Info: Impaired (glasses) Hearing Info: Adequate Speech Info: Adequate    SPECIAL CARE FACTORS FREQUENCY  PT (By licensed PT), OT (By licensed OT)     PT Frequency: 5 x a week OT Frequency: 5 x a week            Contractures Contractures Info: Not present    Additional Factors Info  Code Status, Allergies Code Status Info: full Allergies Info: Carbamazepine,Sertraline Hcl  Current Medications (06/02/2024):  This is the current hospital active medication list Current Facility-Administered Medications  Medication Dose Route Frequency Provider Last Rate Last Admin   acetaminophen  (TYLENOL ) tablet 650 mg  650 mg Oral Q6H PRN Sheikh, Omair Latif, DO       Or   acetaminophen  (TYLENOL ) suppository 650 mg  650 mg Rectal Q6H PRN Sheikh, Omair Latif, DO   650 mg at 05/30/24 0401   aspirin  EC tablet 81 mg  81  mg Oral Daily Sheikh, Omair Latif, DO   81 mg at 06/02/24 1125   bisacodyl  (DULCOLAX) suppository 10 mg  10 mg Rectal Daily PRN Sheikh, Omair Latif, DO       carbidopa -levodopa  (SINEMET  IR) 25-100 MG per tablet immediate release 1 tablet  1 tablet Oral BID Sheikh, Omair Latif, DO   1 tablet at 06/02/24 1124   cefTRIAXone  (ROCEPHIN ) 2 g in sodium chloride  0.9 % 100 mL IVPB  2 g Intravenous Q24H Daniels, James K, NP 200 mL/hr at 06/01/24 0829 2 g at 06/01/24 0829   donepezil  (ARICEPT ) tablet 10 mg  10 mg Oral Daily Sheikh, Omair Latif, DO   10 mg at 06/02/24 1125   enoxaparin  (LOVENOX ) injection 40 mg  40 mg Subcutaneous Q24H Sheikh, Omair Latif, DO   40 mg at 06/01/24 2145   fentaNYL  (SUBLIMAZE ) injection 12.5-25 mcg  12.5-25 mcg Intravenous Q2H PRN Sheikh, Omair Latif, DO       gabapentin  (NEURONTIN ) capsule 300 mg  300 mg Oral Daily Sheikh, Omair Latif, DO   300 mg at 06/02/24 1124   gabapentin  (NEURONTIN ) capsule 600 mg  600 mg Oral QHS Sheikh, Omair Latif, DO   600 mg at 06/01/24 2144   hydrALAZINE  (APRESOLINE ) injection 10 mg  10 mg Intravenous Q6H PRN Sheikh, Omair Latif, DO       iron  polysaccharides (NIFEREX) capsule 150 mg  150 mg Oral Daily Sheikh, Omair Latif, DO   150 mg at 06/02/24 1124   lamoTRIgine  (LAMICTAL ) tablet 150 mg  150 mg Oral BID Sheikh, Omair Latif, DO   150 mg at 06/02/24 1124   levalbuterol (XOPENEX) nebulizer solution 0.63 mg  0.63 mg Nebulization Q6H PRN Sherrill, Omair Latif, DO       levETIRAcetam  (KEPPRA ) tablet 750 mg  750 mg Oral BID Sheikh, Omair Latif, DO   750 mg at 06/02/24 1123   loratadine  (CLARITIN ) tablet 10 mg  10 mg Oral Daily PRN Jadine Toribio SQUIBB, MD       meclizine  (ANTIVERT ) tablet 25 mg  25 mg Oral TID PRN Sheikh, Omair Latif, DO   25 mg at 06/01/24 9091   memantine  (NAMENDA ) tablet 10 mg  10 mg Oral BID Sheikh, Omair Latif, DO   10 mg at 06/02/24 1125   multivitamin with minerals tablet 1 tablet  1 tablet Oral QHS Sheikh, Omair Latif, DO   1 tablet at  06/01/24 2144   nitroGLYCERIN  (NITROSTAT ) SL tablet 0.4 mg  0.4 mg Sublingual Q5 min PRN Sheikh, Omair Latif, DO       ondansetron  (ZOFRAN ) tablet 4 mg  4 mg Oral Q6H PRN Sheikh, Omair [place], DO   4 mg at 06/02/24 0246   Or   ondansetron  (ZOFRAN ) injection 4 mg  4 mg Intravenous Q6H PRN Sheikh, Omair Latif, DO   4 mg at 06/01/24 9091   Oral care mouth rinse  15 mL Mouth Rinse PRN Sheikh, Omair Latif, DO       oxybutynin  (DITROPAN ) tablet 5 mg  5 mg Oral BID Jadine Toribio SQUIBB, MD   5 mg at 06/02/24 1124   oxyCODONE  (Oxy IR/ROXICODONE ) immediate release tablet 5 mg  5 mg Oral Q4H PRN Sheikh, Omair Latif, DO   5 mg at 06/01/24 0825   pantoprazole  (PROTONIX ) EC tablet 40 mg  40 mg Oral Daily Sheikh, Omair Latif, DO   40 mg at 06/02/24 1125   polyethylene glycol (MIRALAX  / GLYCOLAX ) packet 17 g  17 g Oral BID Sheikh, Omair [place], DO   17 g at 05/31/24 9083   polyethylene glycol (MIRALAX  / GLYCOLAX ) packet 17 g  17 g Oral Daily PRN Sheikh, Omair Latif, DO       predniSONE  (DELTASONE ) tablet 5 mg  5 mg Oral Daily Sheikh, Omair Latif, DO   5 mg at 06/02/24 1125   primidone  (MYSOLINE ) tablet 100 mg  100 mg Oral BID Sheikh, Omair Latif, DO   100 mg at 06/02/24 1124   rosuvastatin  (CRESTOR ) tablet 10 mg  10 mg Oral QHS Sheikh, Omair Latif, DO   10 mg at 06/01/24 2144   senna (SENOKOT) tablet 8.6 mg  1 tablet Oral BID Sheikh, Omair Latif, DO   8.6 mg at 06/02/24 1124   SUMAtriptan  (IMITREX ) tablet 50 mg  50 mg Oral Q2H PRN Sheikh, Omair Latif, DO   50 mg at 06/02/24 1124   tiZANidine  (ZANAFLEX ) tablet 4 mg  4 mg Oral Q8H PRN Jadine Toribio SQUIBB, MD   4 mg at 06/01/24 0418   traZODone  (DESYREL ) tablet 200 mg  200 mg Oral QHS Sheikh, Omair Latif, DO   200 mg at 06/01/24 2144   venlafaxine XR (EFFEXOR-XR) 24 hr capsule 75 mg  75 mg Oral Q breakfast Sheikh, Omair Latif, DO   75 mg at 06/02/24 1125     Discharge Medications: Please see discharge summary for a list of discharge medications.  Relevant Imaging  Results:  Relevant Lab Results:   Additional Information SSN [id number]  Tawni HERO Merrik [place], LCSW

## 2024-06-02 NOTE — TOC Progression Note (Signed)
 Transition of Care [facility]) - Progression Note    Patient Details  Name: Valerie [name] MRN: [id number] Date of Birth: [birth year]  Transition of Care [facility]) CM/SW Contact  Tawni [name], LCSW Phone Number: 06/02/2024, 4:05 PM  Clinical Narrative:     CSW spoke with the pt's daughter, Geofm, and informed her that [facility] has declined the pt. Geofm stated that the pt would like to choose [facility], as she has previously stayed at that facility.  CSW informed the pt's daughter that insurance authorization is required and that the pt is currently awaiting to be assign a PASRR number.   CSW spoke with [facility] admission with [facility] , she stated they can not accept pt until Monday and she will need to have Picc line in place for IV abx. Care management to follow.   Expected Discharge Plan: Skilled Nursing Facility Barriers to Discharge: Continued Medical Work up               Expected Discharge Plan and Services In-house Referral: NA Discharge Planning Services: CM Consult Post Acute Care Choice: Skilled Nursing Facility Living arrangements for the past 2 months: Apartment                 DME Arranged: N/A DME Agency: NA       HH Arranged: NA HH Agency: NA         Social Drivers of Health (SDOH) Interventions SDOH Screenings   Food Insecurity: No Food Insecurity (05/30/2024)  Housing: Low Risk  (05/30/2024)  Transportation Needs: No Transportation Needs (05/30/2024)  Utilities: Not At Risk (05/30/2024)  Alcohol Screen: Low Risk  (12/04/2022)  Depression (PHQ2-9): Medium Risk (09/21/2023)  Financial Resource Strain: High Risk (01/11/2023)  Physical Activity: Inactive (09/21/2023)  Social Connections: Socially Isolated (10/21/2023)  Stress: Stress Concern Present (09/21/2023)  Tobacco Use: Medium Risk (05/29/2024)    Readmission Risk Interventions    05/31/2024    3:22 PM  Readmission Risk Prevention Plan  Transportation Screening Complete  Medication  Review (RN Care Manager) Complete  PCP or Specialist appointment within 3-5 days of discharge Complete  HRI or Home Care Consult Complete  SW Recovery Care/Counseling Consult Complete  Palliative Care Screening Not Applicable  Skilled Nursing Facility Complete

## 2024-06-02 NOTE — Progress Notes (Addendum)
 Physical Therapy Treatment Patient Details Name: Valerie [name] MRN: [id number] DOB: 1961/02/15 Today's Date: 06/02/2024   History of Present Illness 63 y.o. female admitted with sepsis 2* bacteremia/UTI, weakness, fall. PMH significant for multiple medical comorbidities including arthritis, anxiety, Bipolar 1 disorder, CKD stage IIIb, history of colovesicular fistula, dementia, depression, DMT2, HTN, hypothyroidism, Parkinson's/neuromuscular disorder, history of sleep apnea and history of TIA/CVA.    PT Comments  Pt is progressing well with mobility, she tolerated ambulating 63' with RW, no loss of balance, distance limited by fatigue. Pt put forth good effort. Pt reports she does not have help available at home and would like to go to rehab.    If plan is discharge home, recommend the following: A little help with bathing/dressing/bathroom;Assistance with cooking/housework;Assist for transportation;Help with stairs or ramp for entrance   Can travel by private vehicle     Yes  Equipment Recommendations  None recommended by PT    Recommendations for Other Services       Precautions / Restrictions Precautions Precautions: Fall Recall of Precautions/Restrictions: Intact Restrictions Weight Bearing Restrictions Per Provider Order: No     Mobility  Bed Mobility           Sit to supine: Modified independent (Device/Increase time), Used rails        Transfers Overall transfer level: Needs assistance Equipment used: Rolling walker (2 wheels) Transfers: Sit to/from Stand Sit to Stand: Supervision           General transfer comment: VCs for hand placement    Ambulation/Gait Ambulation/Gait assistance: Contact guard assist Gait Distance (Feet): 80 Feet Assistive device: Rolling walker (2 wheels) Gait Pattern/deviations: Step-to pattern, Decreased stride length Gait velocity: decr     General Gait Details: steady, no loss of balance, distance limited by  fatigue   Stairs             Wheelchair Mobility     Tilt Bed    Modified Rankin (Stroke Patients Only)       Balance Overall balance assessment: Needs assistance Sitting-balance support: No upper extremity supported, Feet supported Sitting balance-Leahy Scale: Fair     Standing balance support: Bilateral upper extremity supported, During functional activity Standing balance-Leahy Scale: Poor                              Communication Communication Communication: No apparent difficulties  Cognition Arousal: Alert Behavior During Therapy: WFL for tasks assessed/performed   PT - Cognitive impairments: No apparent impairments                         Following commands: Intact      Cueing Cueing Techniques: Verbal cues  Exercises      General Comments        Pertinent Vitals/Pain Pain Assessment Pain Score: 0-No pain    Home Living                          Prior Function            PT Goals (current goals can now be found in the care plan section) Acute Rehab PT Goals Patient Stated Goal: to regain PLOF/independence PT Goal Formulation: With patient Time For Goal Achievement: 06/14/24 Potential to Achieve Goals: Good Progress towards PT goals: Progressing toward goals    Frequency    Min 2X/week      PT  Plan      Co-evaluation              AM-PAC PT 6 Clicks Mobility   Outcome Measure  Help needed turning from your back to your side while in a flat bed without using bedrails?: None Help needed moving from lying on your back to sitting on the side of a flat bed without using bedrails?: A Little Help needed moving to and from a bed to a chair (including a wheelchair)?: A Little Help needed standing up from a chair using your arms (e.g., wheelchair or bedside chair)?: A Little Help needed to walk in hospital room?: A Little Help needed climbing 3-5 steps with a railing? : A Little 6 Click  Score: 19    End of Session Equipment Utilized During Treatment: Gait belt Activity Tolerance: Patient tolerated treatment well Patient left: in bed;with bed alarm set;with call bell/phone within reach Nurse Communication: Mobility status PT Visit Diagnosis: Muscle weakness (generalized) (M62.81);Difficulty in walking, not elsewhere classified (R26.2)     Time: 8695-8676 PT Time Calculation (min) (ACUTE ONLY): 19 min  Charges:    $Gait Training: 8-22 mins PT General Charges $$ ACUTE PT VISIT: 1 Visit                     Sylvan Delon Copp PT 06/02/2024  Acute Rehabilitation Services  Office [phone]

## 2024-06-02 NOTE — Progress Notes (Signed)
 30 Day PASRR Note   Patient Details  Name: Valerie [name] Date of Birth: [birth year]   Transition of Care [facility]) CM/SW Contact:    Tawni [name], LCSW Phone Number: 06/02/2024, 12:11 PM  To Whom It May Concern:  Please be advised that this patient will require a short-term nursing home stay - anticipated 30 days or less for rehabilitation and strengthening.   The plan is for return home.

## 2024-06-02 NOTE — Progress Notes (Signed)
  Progress Note   [name] FMW:[id number] DOB: December 17, 1960 DOA: 05/29/2024     4 DOS: the patient was seen and examined on 06/02/2024   Brief hospital course:   Assessment and Plan: Sepsis  E coli bacteremia and UTI  Sepsis resolved. Discussed with pharmacy, will need to treat total 7 days ceftriaxone .      AKI  CKD Stage IIIb  Baseline creatinine around 1.3.  Creatinine on admission 2.07.  Creatinine continues to trend down, just about at baseline.   Hyponatremia Resolved.   Alzheimer's Dementia Remains stable.  Donepezil  and memantine  Continue delirium precautions   Neuromuscular Disorder  Parkinson's  Continue carbidopa -levodopa    HLD/ Nonobstructive CAD Continue aspirin , statin   Bipolar 1 Disorder Panic Disorder GAD Insomnia  Sleep Apnea Continue lamotrigine , trazodone     Seizure Disorder Continue lamtotrigine, levetiracetam  and gabapentin      Normocytic Anemia Stable   GERD/GI Prophylaxis Pantoprazole     Class II Obesity Body mass index is 41.57 kg/m.    Fall   Now stable for SNF      Subjective:  Feels better today Migraine free  Physical Exam: Vitals:   06/02/24 0421 06/02/24 1426 06/02/24 1707 06/02/24 1710  BP: (!) 153/72 124/70 (!) 148/81 (!) 148/81  Pulse: 88 65 (!) 57 (!) 57  Resp: 19 18 16 18   Temp: 99.2 F (37.3 C) 99.3 F (37.4 C) 98.3 F (36.8 C) 98.3 F (36.8 C)  TempSrc: Oral Oral Oral Oral  SpO2: 92% 91% (!) 88%   Weight:      Height:       Physical Exam Vitals reviewed.  Constitutional:      General: She is not in acute distress.    Appearance: She is not ill-appearing or toxic-appearing.     Comments: Sitting in chair  Cardiovascular:     Rate and Rhythm: Normal rate and regular rhythm.     Heart sounds: No murmur heard. Pulmonary:     Effort: Pulmonary effort is normal. No respiratory distress.     Breath sounds: No wheezing, rhonchi or rales.  Neurological:     Mental Status: She is alert.   Psychiatric:        Mood and Affect: Mood normal.        Behavior: Behavior normal.     Data Reviewed: Creatinine 1.33, improving   Family Communication: none  Disposition: Status is: Inpatient Remains inpatient appropriate because: needs SNF     Time spent: 20 minutes  Author: Toribio Door, MD 06/02/2024 6:05 PM  For on call review [email or web address].

## 2024-06-03 DIAGNOSIS — R7881 Bacteremia: Secondary | ICD-10-CM | POA: Diagnosis not present

## 2024-06-03 DIAGNOSIS — N1832 Chronic kidney disease, stage 3b: Secondary | ICD-10-CM | POA: Diagnosis not present

## 2024-06-03 DIAGNOSIS — B9620 Unspecified Escherichia coli [E. coli] as the cause of diseases classified elsewhere: Secondary | ICD-10-CM | POA: Diagnosis not present

## 2024-06-03 NOTE — Progress Notes (Addendum)
  Progress Note   [name] FMW:[id number] DOB: Oct 22, 1960 DOA: 05/29/2024     5 DOS: the patient was seen and examined on 06/03/2024   Brief hospital course: 63 year old woman multiple medical problems presented with a fall and urinary symptoms.  Admitted for sepsis secondary to UTI, AKI.  Treated with empiric antibiotics with gradual clinical improvement and resolution of AKI.  Urine pathogen with resistance pattern requiring IV antibiotics to complete treatment.  Afterwards plan SNF though pt now thinking about going home.  Consultants None  Procedures/Events None  Assessment and Plan: Sepsis  E coli bacteremia and UTI  Sepsis resolved. Discussed with pharmacy, will need to treat total 7 days ceftriaxone .  Will complete treatment prior to transfer therefore no PICC line needed.    AKI  CKD Stage IIIb  Baseline creatinine around 1.3.  Creatinine on admission 2.07.  Nearly back to baseline.  No further evaluation suggested.   Hyponatremia Resolved.   Alzheimer's Dementia Remains stable.  Donepezil  and memantine  Continue delirium precautions   Neuromuscular Disorder  Parkinson's  Continue carbidopa -levodopa    HLD/ Nonobstructive CAD Continue aspirin , statin   Bipolar 1 Disorder Panic Disorder GAD Insomnia  Sleep Apnea Continue lamotrigine , trazodone     Seizure Disorder Continue lamtotrigine, levetiracetam  and gabapentin      Normocytic Anemia Stable   GERD/GI Prophylaxis Pantoprazole     Class II Obesity Body mass index is 41.57 kg/m.    Fall    Remains stable for SNF       Subjective:  Feels better  Physical Exam: Vitals:   06/03/24 0605 06/03/24 0906 06/03/24 0957 06/03/24 1313  BP: (!) 130/59 132/69 131/76 131/78  Pulse: (!) 57 67 72 69  Resp: 20 16 18 16   Temp:  98.5 F (36.9 C) 98.4 F (36.9 C) 98.5 F (36.9 C)  TempSrc:  Oral Oral   SpO2: 93% (!) 87% 93% 99%  Weight:      Height:       Physical Exam Vitals reviewed.   Constitutional:      General: She is not in acute distress.    Appearance: She is not ill-appearing or toxic-appearing.  Cardiovascular:     Rate and Rhythm: Normal rate and regular rhythm.     Heart sounds: No murmur heard. Pulmonary:     Effort: Pulmonary effort is normal. No respiratory distress.     Breath sounds: No wheezing, rhonchi or rales.  Neurological:     Mental Status: She is alert.  Psychiatric:        Mood and Affect: Mood normal.        Behavior: Behavior normal.     Data Reviewed: No new labs  Family Communication: none  Disposition: Status is: Inpatient Remains inpatient appropriate because: await SNF     Time spent: 20 minutes  Author: Toribio Door, MD 06/03/2024 5:59 PM  For on call review [email or web address].

## 2024-06-03 NOTE — Plan of Care (Signed)

## 2024-06-03 NOTE — Plan of Care (Signed)
  Problem: Activity: Goal: Risk for activity intolerance will decrease Outcome: Progressing   Problem: Nutrition: Goal: Adequate nutrition will be maintained Outcome: Progressing   Problem: Coping: Goal: Level of anxiety will decrease Outcome: Progressing   Problem: Elimination: Goal: Will not experience complications related to urinary retention Outcome: Progressing   Problem: Pain Managment: Goal: General experience of comfort will improve and/or be controlled Outcome: Progressing   Problem: Skin Integrity: Goal: Risk for impaired skin integrity will decrease Outcome: Progressing

## 2024-06-03 NOTE — Progress Notes (Signed)
 Mobility Specialist - Progress Note   06/03/24 0938  Mobility  Activity Ambulated with assistance  Level of Assistance Contact guard assist, steadying assist  Assistive Device Front wheel walker  Distance Ambulated (ft) 150 ft  Range of Motion/Exercises Active  Activity Response Tolerated well  Mobility Referral Yes  Mobility visit 1 Mobility  Mobility Specialist Start Time (ACUTE ONLY) [id number]  Mobility Specialist Stop Time (ACUTE ONLY) [id number]  Mobility Specialist Time Calculation (min) (ACUTE ONLY) 10 min   Pt was found in bed and agreeable to mobilize. C/o headache throughout session. At EOS returned to bed with all needs met. Call bell in reach and RN notified pt requesting pain medicine. Bed alarm on.   Erminio Leos,  Mobility Specialist Can be reached via Secure Chat

## 2024-06-03 NOTE — Progress Notes (Signed)
   06/03/24 0053  BiPAP/CPAP/SIPAP  Patient Home Machine Yes  Safety Check Completed by RT for Home Unit Yes, no issues noted  Patient Home Mask Yes  Patient Home Tubing Yes  Auto Titrate  (home setting)  Device Plugged into RED Power Outlet Yes

## 2024-06-04 DIAGNOSIS — N1832 Chronic kidney disease, stage 3b: Secondary | ICD-10-CM | POA: Diagnosis not present

## 2024-06-04 DIAGNOSIS — N179 Acute kidney failure, unspecified: Secondary | ICD-10-CM | POA: Diagnosis not present

## 2024-06-04 NOTE — Progress Notes (Signed)
  Progress Note   [name] FMW:[id number] DOB: [birth year] DOA: 05/29/2024     6 DOS: the patient was seen and examined on 06/04/2024   Brief hospital course: 63 year old woman multiple medical problems presented with a fall and urinary symptoms.  Admitted for sepsis secondary to UTI, AKI.  Treated with empiric antibiotics with gradual clinical improvement and resolution of AKI.  Urine pathogen with resistance pattern requiring IV antibiotics to complete treatment.    Consultants None  Procedures/Events None  Assessment and Plan: Sepsis  E coli bacteremia and UTI  Sepsis resolved. Discussed with pharmacy, will need to treat total 7 days ceftriaxone .  Will complete treatment prior to transfer therefore no PICC line needed. Home tomorrow    AKI  CKD Stage IIIb  Baseline creatinine around 1.3.  Creatinine on admission 2.07.  Nearly back to baseline.  No further evaluation suggested.   Hyponatremia Resolved.   Alzheimer's Dementia Remains stable.  Donepezil  and memantine  Continue delirium precautions   Neuromuscular Disorder  Parkinson's  Continue carbidopa -levodopa    HLD/ Nonobstructive CAD Continue aspirin , statin   Bipolar 1 Disorder Panic Disorder GAD Insomnia  Sleep Apnea Continue lamotrigine , trazodone     Seizure Disorder Continue lamtotrigine, levetiracetam  and gabapentin      Normocytic Anemia Stable   GERD/GI Prophylaxis Pantoprazole     Class II Obesity Body mass index is 41.57 kg/m.    Fall       Subjective:  Feels fine  Physical Exam: Vitals:   06/03/24 0957 06/03/24 1313 06/03/24 2245 06/04/24 0653  BP: 131/76 131/78 117/60 114/61  Pulse: 72 69 80 67  Resp: 18 16 17 17   Temp: 98.4 F (36.9 C) 98.5 F (36.9 C) 98.5 F (36.9 C) 98.7 F (37.1 C)  TempSrc: Oral     SpO2: 93% 99% 98% 98%  Weight:      Height:       Physical Exam Vitals reviewed.  Constitutional:      General: She is not in acute distress.     Appearance: She is not ill-appearing or toxic-appearing.  Cardiovascular:     Rate and Rhythm: Normal rate and regular rhythm.     Heart sounds: No murmur heard. Pulmonary:     Effort: Pulmonary effort is normal. No respiratory distress.     Breath sounds: No wheezing, rhonchi or rales.  Neurological:     Mental Status: She is alert.  Psychiatric:        Mood and Affect: Mood normal.        Behavior: Behavior normal.     Data Reviewed: No new data  Family Communication: none  Disposition: Status is: Inpatient Remains inpatient appropriate because: sepsis, bacteremia     Time spent: 20 minutes  Author: Toribio Door, MD 06/04/2024 12:14 PM  For on call review [email or web address].

## 2024-06-04 NOTE — Plan of Care (Signed)
   Problem: Elimination: Goal: Will not experience complications related to urinary retention Outcome: Progressing   Problem: Pain Managment: Goal: General experience of comfort will improve and/or be controlled Outcome: Progressing   Problem: Skin Integrity: Goal: Risk for impaired skin integrity will decrease Outcome: Progressing

## 2024-06-04 NOTE — Progress Notes (Signed)
   06/04/24 0300  BiPAP/CPAP/SIPAP  BiPAP/CPAP/SIPAP Pt Type Adult  FiO2 (%) 21 %  Patient Home Machine Yes  Safety Check Completed by RT for Home Unit Yes, no issues noted  Patient Home Mask Yes  Patient Home Tubing Yes  Device Plugged into RED Power Outlet Yes

## 2024-06-04 NOTE — Plan of Care (Signed)
 Patient admission education progressing

## 2024-06-04 NOTE — Progress Notes (Signed)
 Ins auth started; pending approval.

## 2024-06-04 NOTE — Progress Notes (Signed)
   06/04/24 2258  BiPAP/CPAP/SIPAP  BiPAP/CPAP/SIPAP Pt Type Adult  FiO2 (%) 21 %  Patient Home Machine Yes  Safety Check Completed by RT for Home Unit Yes, no issues noted  Patient Home Mask Yes  Patient Home Tubing Yes  Auto Titrate  (home settings)  Device Plugged into RED Power Outlet Yes

## 2024-06-05 DIAGNOSIS — A419 Sepsis, unspecified organism: Secondary | ICD-10-CM | POA: Diagnosis not present

## 2024-06-05 DIAGNOSIS — B9620 Unspecified Escherichia coli [E. coli] as the cause of diseases classified elsewhere: Secondary | ICD-10-CM | POA: Diagnosis not present

## 2024-06-05 DIAGNOSIS — N179 Acute kidney failure, unspecified: Secondary | ICD-10-CM | POA: Diagnosis not present

## 2024-06-05 LAB — CREATININE, SERUM
Creatinine, Ser: 1.25 mg/dL — ABNORMAL HIGH (ref 0.44–1.00)
GFR, Estimated: 48 mL/min — ABNORMAL LOW (ref >60)

## 2024-06-05 MED ORDER — HEPARIN SOD (PORK) LOCK FLUSH 100 UNIT/ML IV SOLN
500.0000 [IU] | INTRAVENOUS | Status: AC | PRN
  Administered 2024-06-05: 500 [IU]

## 2024-06-05 NOTE — Care Management Important Message (Signed)
 Important Message  Patient Details IM Letter given. Name: Cami Delawder MRN: [id number] Date of Birth: [birth year]   Important Message Given:        Melba Ates 06/05/2024, 11:11 AM

## 2024-06-05 NOTE — Progress Notes (Signed)
 Physical Therapy Treatment Patient Details Name: Valerie [name] MRN: [id number] DOB: 04/12/1961 Today's Date: 06/05/2024   History of Present Illness 63 y.o. female admitted with sepsis 2* bacteremia/UTI, weakness, fall. PMH significant for multiple medical comorbidities including arthritis, anxiety, Bipolar 1 disorder, CKD stage IIIb, history of colovesicular fistula, dementia, depression, DMT2, HTN, hypothyroidism, Parkinson's/neuromuscular disorder, history of sleep apnea and history of TIA/CVA.    PT Comments  Pt is making steady progress, reports feeling much better,  motivated to work with PT and d/c home. Pt demonstrates carryover from prior sessions, using proper technique with RW during gait and transfers. No LOB during session. Pt reports getting up to [facility], mobilizing independently in room today. Will benefit from HHPT     If plan is discharge home, recommend the following: Help with stairs or ramp for entrance   Can travel by private vehicle        Equipment Recommendations  None recommended by PT    Recommendations for Other Services       Precautions / Restrictions Precautions Precautions: Fall Recall of Precautions/Restrictions: Intact Restrictions Weight Bearing Restrictions Per Provider Order: No     Mobility  Bed Mobility               General bed mobility comments: pt on BSC and returned to recliner    Transfers Overall transfer level: Needs assistance Equipment used: Rolling walker (2 wheels) Transfers: Sit to/from Stand Sit to Stand: Supervision, Modified independent (Device/Increase time)           General transfer comment: ongoing education for proper hand placement and safety, subtle cues; pt demonstrates carryover from prior sessions    Ambulation/Gait Ambulation/Gait assistance: Supervision, Contact guard assist Gait Distance (Feet): 140 Feet Assistive device: Rolling walker (2 wheels) Gait Pattern/deviations: Step-through pattern,  Decreased stride length Gait velocity: decr but functional     General Gait Details: pt demonstrates proper RW position, incr stride length and more fluid gait pattern, no LOB-lightly reliant on RW   Stairs             Wheelchair Mobility     Tilt Bed    Modified Rankin (Stroke Patients Only)       Balance Overall balance assessment: Needs assistance Sitting-balance support: No upper extremity supported, Feet supported Sitting balance-Leahy Scale: Good     Standing balance support: No upper extremity supported, During functional activity, Reliant on assistive device for balance Standing balance-Leahy Scale: Fair               High level balance activites: Side stepping, Backward walking, Head turns, Turns High Level Balance Comments: no LOB, RW used throughout dynamic activities            Communication Communication Communication: No apparent difficulties  Cognition Arousal: Alert Behavior During Therapy: WFL for tasks assessed/performed   PT - Cognitive impairments: No apparent impairments                         Following commands: Intact      Cueing Cueing Techniques: Verbal cues  Exercises      General Comments        Pertinent Vitals/Pain Pain Assessment Pain Assessment: No/denies pain    Home Living                          Prior Function            PT Goals (  current goals can now be found in the care plan section) Acute Rehab PT Goals Patient Stated Goal: to regain PLOF/independence PT Goal Formulation: With patient Time For Goal Achievement: 06/14/24 Potential to Achieve Goals: Good Progress towards PT goals: Progressing toward goals    Frequency    Min 2X/week      PT Plan      Co-evaluation              AM-PAC PT 6 Clicks Mobility   Outcome Measure  Help needed turning from your back to your side while in a flat bed without using bedrails?: None Help needed moving from lying on  your back to sitting on the side of a flat bed without using bedrails?: None Help needed moving to and from a bed to a chair (including a wheelchair)?: None Help needed standing up from a chair using your arms (e.g., wheelchair or bedside chair)?: None Help needed to walk in hospital room?: None Help needed climbing 3-5 steps with a railing? : A Little 6 Click Score: 23    End of Session Equipment Utilized During Treatment: Gait belt Activity Tolerance: Patient tolerated treatment well Patient left: in chair;with call bell/phone within reach Nurse Communication: Mobility status PT Visit Diagnosis: Muscle weakness (generalized) (M62.81);Difficulty in walking, not elsewhere classified (R26.2)     Time: 8992-8978 PT Time Calculation (min) (ACUTE ONLY): 14 min  Charges:    $Gait Training: 8-22 mins PT General Charges $$ ACUTE PT VISIT: 1 Visit                     Florette Thai, PT  Acute Rehab Dept [facility]) [phone]  06/05/2024    [facility] 06/05/2024, 10:31 AM

## 2024-06-05 NOTE — Plan of Care (Signed)
 ?  Problem: Coping: ?Goal: Level of anxiety will decrease ?Outcome: Progressing ?  ?Problem: Safety: ?Goal: Ability to remain free from injury will improve ?Outcome: Progressing ?  ?

## 2024-06-05 NOTE — Discharge Summary (Signed)
 Physician Discharge Summary   [name] MRN: [id number] DOB: [birth year]  Admit date:     05/29/2024  Discharge date: 06/05/24  Discharge Physician: Toribio Door   PCP: Arloa Jarvis, NP   Recommendations at discharge:  Follow-up resolution of infection HH for PT/OT  Discharge Diagnoses: Principal Problem:   Sepsis (HCC)  E coli bacteremia and UTI  AKI  CKD Stage IIIb  Hyponatremia Alzheimer's Dementia Neuromuscular Disorder  Parkinson's  HLD/ Nonobstructive CAD Bipolar 1 Disorder Panic Disorder GAD Insomnia  Sleep Apnea Seizure Disorder Normocytic Anemia GERD Class II Obesity Body mass index is 41.57 kg/m  Hospital Course: 63 year old woman multiple medical problems presented with a fall and urinary symptoms.  Admitted for sepsis secondary to UTI, AKI.  Treated with empiric antibiotics with gradual clinical improvement and resolution of AKI.  Urine pathogen with resistance pattern requiring IV antibiotics to complete treatment. Completed IV abx, hospitalization uncomplicated. SNF recommended, but patient declined, and requested discharge home, requested [facility]  Consultants None  Procedures/Events None  Sepsis  E coli bacteremia and UTI  Sepsis resolved. Discussed with pharmacy, needed to treat total 7 days ceftriaxone . Completed treatment. Home     AKI  CKD Stage IIIb  Baseline creatinine around 1.3.  Creatinine on admission 2.07.  Nearly back to baseline.  No further evaluation suggested.   Hyponatremia Resolved.   Alzheimer's Dementia Stable.  Donepezil  and memantine    Neuromuscular Disorder  Parkinson's  Continue carbidopa -levodopa    HLD/ Nonobstructive CAD Continue aspirin , statin   Bipolar 1 Disorder Panic Disorder GAD Insomnia  Sleep Apnea Continue lamotrigine , trazodone     Seizure Disorder Continue lamtotrigine, levetiracetam  and gabapentin      Normocytic Anemia Stable   GERD/GI  Prophylaxis Pantoprazole     Class II Obesity Body mass index is 41.57 kg/m.    Fall   Disposition: Home health Diet recommendation:  Cardiac diet DISCHARGE MEDICATION: Allergies as of 06/05/2024       Reactions   Carbamazepine Other (See Comments)   Parkinsons like symptoms, tremors   Sertraline Hcl Other (See Comments)   Parkinsons like symptoms        Medication List     STOP taking these medications    ondansetron  8 MG tablet Commonly known as: ZOFRAN        TAKE these medications    aspirin  EC 81 MG tablet Take 81 mg by mouth daily. Swallow whole.   carbidopa -levodopa  25-100 MG tablet Commonly known as: SINEMET  IR Take 1 tablet by mouth in the morning and at bedtime.   desvenlafaxine 25 MG 24 hr tablet Commonly known as: PRISTIQ Take 25 mg by mouth daily. What changed: how much to take   donepezil  10 MG tablet Commonly known as: ARICEPT  Take 10 mg by mouth daily.   Enbrel 50 MG/ML injection Generic drug: etanercept Inject 50 mg into the skin once a week. Patient takes this medication on Mondays   FISH OIL PO Take 2 capsules by mouth daily.   fluconazole  150 MG tablet Commonly known as: DIFLUCAN  Take 150 mg by mouth as needed (Recurring UTI).   gabapentin  300 MG capsule Commonly known as: NEURONTIN  Take 300-600 mg by mouth See admin instructions. Take 300 mg in the morning and 600 mg at night   lamoTRIgine  150 MG tablet Commonly known as: LAMICTAL  Take 1 tablet by mouth 2 (two) times daily.   LEQEMBI IV Inject into the vein every 14 (fourteen) days.   levETIRAcetam  750 MG tablet Commonly known as: KEPPRA   Take 750 mg by mouth 2 (two) times daily.   levocetirizine 5 MG tablet Commonly known as: XYZAL  Take 1 tablet (5 mg total) by mouth every evening. What changed:  when to take this reasons to take this   meclizine  25 MG tablet Commonly known as: ANTIVERT  Take 25 mg by mouth 3 (three) times daily as needed for dizziness.    memantine  10 MG tablet Commonly known as: NAMENDA  Take 10 mg by mouth 2 (two) times daily.   multivitamin with minerals tablet Take 1 tablet by mouth at bedtime.   nitroGLYCERIN  0.4 MG SL tablet Commonly known as: NITROSTAT  Place 1 tablet (0.4 mg total) under the tongue every 5 (five) minutes as needed for chest pain.   omeprazole  40 MG capsule Commonly known as: PRILOSEC Take 1 capsule (40 mg total) by mouth daily.   ondansetron  8 MG disintegrating tablet Commonly known as: ZOFRAN -ODT Take 8 mg by mouth every 8 (eight) hours as needed for nausea or vomiting.   oxybutynin  5 MG tablet Commonly known as: DITROPAN  Take 5 mg by mouth 2 (two) times daily.   polyethylene glycol 17 g packet Commonly known as: MIRALAX  / GLYCOLAX  Take 17 g by mouth 2 (two) times daily. What changed:  when to take this reasons to take this   predniSONE  5 MG tablet Commonly known as: DELTASONE  Take 10 mg by mouth daily.   primidone  50 MG tablet Commonly known as: MYSOLINE  Take 100 mg by mouth 2 (two) times daily.   rizatriptan  10 MG tablet Commonly known as: MAXALT  Take 10 mg by mouth as needed for migraine. May repeat in 2 hours if needed   rosuvastatin  10 MG tablet Commonly known as: CRESTOR  Take 1 tablet (10 mg total) by mouth daily. What changed: when to take this   sennosides-docusate sodium  8.6-50 MG tablet Commonly known as: SENOKOT-S Take 2 tablets by mouth daily.   STOOL SOFTENER PO Take 1 tablet by mouth in the morning and at bedtime.   tiZANidine  4 MG tablet Commonly known as: ZANAFLEX  Take 1 tablet (4 mg total) by mouth 3 (three) times daily. What changed:  when to take this reasons to take this   traZODone  100 MG tablet Commonly known as: DESYREL  Take 2 tablets (200 mg total) by mouth at bedtime.        Contact information for follow-up providers     Arloa Jarvis, NP. Schedule an appointment as soon as possible for a visit in 1 week(s).   Specialty: Nurse  Practitioner Contact information: [address] [place] [address] [place] [address] [phone]              Contact information for after-discharge care     Destination     [facility] Preferred SNF .   Service: Skilled Nursing Contact information: [address] [place]  [address] [phone]                    Discharge Exam: Fredricka Weights   05/30/24 2321  Weight: 103.1 kg   Physical Exam Vitals reviewed.  Constitutional:      General: She is not in acute distress.    Appearance: She is not ill-appearing or toxic-appearing.  Cardiovascular:     Rate and Rhythm: Normal rate and regular rhythm.     Heart sounds: No murmur heard. Pulmonary:     Effort: Pulmonary effort is normal. No respiratory distress.     Breath sounds: No wheezing, rhonchi or  rales.  Neurological:     Mental Status: She is alert.  Psychiatric:        Mood and Affect: Mood normal.        Behavior: Behavior normal.      Condition at discharge: good  The results of significant diagnostics from this hospitalization (including imaging, microbiology, ancillary and laboratory) are listed below for reference.   Imaging Studies: DG Chest Port 1 View Result Date: 05/29/2024 EXAM: 1 VIEW(S) XRAY OF THE CHEST 05/29/2024 10:30:51 AM COMPARISON: 01/02/2024 CLINICAL HISTORY: Questionable sepsis - evaluate for abnormality. Questionable sepsis FINDINGS: LINES, TUBES AND DEVICES: Right chest port in place with tip overlying the SVC. LUNGS AND PLEURA: Low lung volumes. No focal pulmonary opacity. No pulmonary edema. No pleural effusion. No pneumothorax. HEART AND MEDIASTINUM: No acute abnormality of the cardiac and mediastinal silhouettes. BONES AND SOFT TISSUES: Remote right rib fracture. IMPRESSION: 1. No acute cardiopulmonary abnormality. 2. Low lung volumes. Electronically signed by: Evalene Coho MD 05/29/2024 10:39 AM EDT RP Workstation: [id number]     Microbiology: Results for orders placed or performed during the hospital encounter of 05/29/24  Blood Culture (routine x 2)     Status: Abnormal   Collection Time: 05/29/24 10:22 AM   Specimen: BLOOD  Result Value Ref Range Status   Specimen Description   Final    BLOOD RIGHT ANTECUBITAL Performed at [facility], [address], [place], [place] [address]    Special Requests   Final    BOTTLES DRAWN AEROBIC AND ANAEROBIC Blood Culture results may not be optimal due to an inadequate volume of blood received in culture bottles Performed at [facility], [address] [place], [place] [address]    Culture  Setup Time   Final    GRAM NEGATIVE RODS ANAEROBIC BOTTLE ONLY CRITICAL RESULT CALLED TO, READ BACK BY AND VERIFIED WITH: L POINDEXTER PHARMD 05/30/2024 0355 BY DD Performed at [facility], [address] [place], [place] [address]    Culture ESCHERICHIA COLI (A)  Final   Report Status 06/01/2024 FINAL  Final   Organism ID, Bacteria ESCHERICHIA COLI  Final      Susceptibility   Escherichia coli - MIC*    AMPICILLIN  >=32 RESISTANT Resistant     CEFAZOLIN  (NON-URINE) >=32 RESISTANT Resistant     CEFEPIME <=0.12 SENSITIVE Sensitive     ERTAPENEM <=0.12 SENSITIVE Sensitive     CEFTRIAXONE  1 SENSITIVE Sensitive     CIPROFLOXACIN 0.5 INTERMEDIATE Intermediate     GENTAMICIN <=1 SENSITIVE Sensitive     MEROPENEM <=0.25 SENSITIVE Sensitive     TRIMETH /SULFA  >=320 RESISTANT Resistant     AMPICILLIN /SULBACTAM >=32 RESISTANT Resistant     PIP/TAZO Value in next row Sensitive      <=4 SENSITIVEThis is a modified FDA-approved test that has been validated and its performance characteristics determined by the reporting laboratory.  This laboratory is certified under the Clinical Laboratory Improvement Amendments CLIA as qualified to perform high complexity clinical laboratory testing.    * ESCHERICHIA COLI  Blood Culture ID Panel (Reflexed)      Status: Abnormal   Collection Time: 05/29/24 10:22 AM  Result Value Ref Range Status   Enterococcus faecalis NOT DETECTED NOT DETECTED Final   Enterococcus Faecium NOT DETECTED NOT DETECTED Final   Listeria monocytogenes NOT DETECTED NOT DETECTED Final   Staphylococcus species NOT DETECTED NOT DETECTED Final   Staphylococcus aureus (BCID) NOT DETECTED NOT DETECTED Final   Staphylococcus epidermidis NOT  DETECTED NOT DETECTED Final   Staphylococcus lugdunensis NOT DETECTED NOT DETECTED Final   Streptococcus species NOT DETECTED NOT DETECTED Final   Streptococcus agalactiae NOT DETECTED NOT DETECTED Final   Streptococcus pneumoniae NOT DETECTED NOT DETECTED Final   Streptococcus pyogenes NOT DETECTED NOT DETECTED Final   A.calcoaceticus-baumannii NOT DETECTED NOT DETECTED Final   Bacteroides fragilis NOT DETECTED NOT DETECTED Final   Enterobacterales DETECTED (A) NOT DETECTED Final    Comment: Enterobacterales represent a large order of gram negative bacteria, not a single organism. CRITICAL RESULT CALLED TO, READ BACK BY AND VERIFIED WITH: L POINDEXTER PHARMD 05/30/2024 0355 BY DD    Enterobacter cloacae complex NOT DETECTED NOT DETECTED Final   Escherichia coli DETECTED (A) NOT DETECTED Final    Comment: CRITICAL RESULT CALLED TO, READ BACK BY AND VERIFIED WITH: L POINDEXTER PHARMD 05/30/2024 0355 BY DD    Klebsiella aerogenes NOT DETECTED NOT DETECTED Final   Klebsiella oxytoca NOT DETECTED NOT DETECTED Final   Klebsiella pneumoniae NOT DETECTED NOT DETECTED Final   Proteus species NOT DETECTED NOT DETECTED Final   Salmonella species NOT DETECTED NOT DETECTED Final   Serratia marcescens NOT DETECTED NOT DETECTED Final   Haemophilus influenzae NOT DETECTED NOT DETECTED Final   Neisseria meningitidis NOT DETECTED NOT DETECTED Final   Pseudomonas aeruginosa NOT DETECTED NOT DETECTED Final   Stenotrophomonas maltophilia NOT DETECTED NOT DETECTED Final   Candida albicans NOT DETECTED  NOT DETECTED Final   Candida auris NOT DETECTED NOT DETECTED Final   Candida glabrata NOT DETECTED NOT DETECTED Final   Candida krusei NOT DETECTED NOT DETECTED Final   Candida parapsilosis NOT DETECTED NOT DETECTED Final   Candida tropicalis NOT DETECTED NOT DETECTED Final   Cryptococcus neoformans/gattii NOT DETECTED NOT DETECTED Final   CTX-M ESBL NOT DETECTED NOT DETECTED Final   Carbapenem resistance IMP NOT DETECTED NOT DETECTED Final   Carbapenem resistance KPC NOT DETECTED NOT DETECTED Final   Carbapenem resistance NDM NOT DETECTED NOT DETECTED Final   Carbapenem resist OXA 48 LIKE NOT DETECTED NOT DETECTED Final   Carbapenem resistance VIM NOT DETECTED NOT DETECTED Final    Comment: Performed at [facility], [address] [place], [place] [address]  Blood Culture (routine x 2)     Status: Abnormal   Collection Time: 05/29/24 10:27 AM   Specimen: BLOOD  Result Value Ref Range Status   Specimen Description   Final    BLOOD BLOOD RIGHT WRIST Performed at [facility], [address] [place], [place] [address]    Special Requests   Final    BOTTLES DRAWN AEROBIC AND ANAEROBIC Blood Culture adequate volume Performed at [facility], [address], [place], [place] [address]    Culture  Setup Time   Final    GRAM NEGATIVE RODS IN BOTH AEROBIC AND ANAEROBIC BOTTLES CRITICAL VALUE NOTED.  VALUE IS CONSISTENT WITH PREVIOUSLY REPORTED AND CALLED VALUE.    Culture (A)  Final    ESCHERICHIA COLI SUSCEPTIBILITIES PERFORMED ON PREVIOUS CULTURE WITHIN THE LAST 5 DAYS. Performed at [facility], [address] [place], [place] [address]    Report Status 06/01/2024 FINAL  Final  Urine Culture     Status: Abnormal   Collection Time: 05/29/24  1:54 PM   Specimen: Urine, Random  Result Value Ref Range Status   Specimen Description   Final    URINE, RANDOM Performed at [facility], [address]  [place], [place] [address]    Special Requests   Final    NONE Reflexed from [id number] Performed at [facility], [address] [place], [place] [address]    Culture >=100,000 COLONIES/mL ESCHERICHIA COLI (A)  Final   Report Status 05/31/2024 FINAL  Final   Organism ID, Bacteria ESCHERICHIA COLI (A)  Final      Susceptibility   Escherichia coli - MIC*    AMPICILLIN  >=32 RESISTANT Resistant     CEFAZOLIN  (URINE) Value in next row Resistant      >=32 RESISTANTThis is a modified FDA-approved test that has been validated and its performance characteristics determined by the reporting laboratory.  This laboratory is certified under the Clinical Laboratory Improvement Amendments CLIA as qualified to perform high complexity clinical laboratory testing.    CEFEPIME Value in next row Sensitive      >=32 RESISTANTThis is a modified FDA-approved test that has been validated and its performance characteristics determined by the reporting laboratory.  This laboratory is certified under the Clinical Laboratory Improvement Amendments CLIA as qualified to perform high complexity clinical laboratory testing.    ERTAPENEM Value in next row Sensitive      >=32 RESISTANTThis is a modified FDA-approved test that has been validated and its performance characteristics determined by the reporting laboratory.  This laboratory is certified under the Clinical Laboratory Improvement Amendments CLIA as qualified to perform high complexity clinical laboratory testing.    CEFTRIAXONE  Value in next row Sensitive      >=32 RESISTANTThis is a modified FDA-approved test that has been validated and its performance characteristics determined by the reporting laboratory.  This laboratory is certified under the Clinical Laboratory Improvement Amendments CLIA as qualified to perform high complexity clinical laboratory testing.    CIPROFLOXACIN Value in next row Resistant      >=32 RESISTANTThis is a modified FDA-approved  test that has been validated and its performance characteristics determined by the reporting laboratory.  This laboratory is certified under the Clinical Laboratory Improvement Amendments CLIA as qualified to perform high complexity clinical laboratory testing.    GENTAMICIN Value in next row Sensitive      >=32 RESISTANTThis is a modified FDA-approved test that has been validated and its performance characteristics determined by the reporting laboratory.  This laboratory is certified under the Clinical Laboratory Improvement Amendments CLIA as qualified to perform high complexity clinical laboratory testing.    NITROFURANTOIN Value in next row Sensitive      >=32 RESISTANTThis is a modified FDA-approved test that has been validated and its performance characteristics determined by the reporting laboratory.  This laboratory is certified under the Clinical Laboratory Improvement Amendments CLIA as qualified to perform high complexity clinical laboratory testing.    TRIMETH /SULFA  Value in next row Sensitive      >=32 RESISTANTThis is a modified FDA-approved test that has been validated and its performance characteristics determined by the reporting laboratory.  This laboratory is certified under the Clinical Laboratory Improvement Amendments CLIA as qualified to perform high complexity clinical laboratory testing.    AMPICILLIN /SULBACTAM Value in next row Intermediate      >=32 RESISTANTThis is a modified FDA-approved test that has been validated and its performance characteristics determined by the reporting laboratory.  This laboratory is certified under the Clinical Laboratory Improvement Amendments CLIA as qualified to perform high complexity clinical laboratory testing.    PIP/TAZO Value in next row Sensitive      <=4 SENSITIVEThis is a modified FDA-approved test that has  been validated and its performance characteristics determined by the reporting laboratory.  This laboratory is certified under the  Clinical Laboratory Improvement Amendments CLIA as qualified to perform high complexity clinical laboratory testing.    MEROPENEM Value in next row Sensitive      <=4 SENSITIVEThis is a modified FDA-approved test that has been validated and its performance characteristics determined by the reporting laboratory.  This laboratory is certified under the Clinical Laboratory Improvement Amendments CLIA as qualified to perform high complexity clinical laboratory testing.    * >=100,000 COLONIES/mL ESCHERICHIA COLI    Labs: CBC: Recent Labs  Lab 05/29/24 1022 05/30/24 0458 05/31/24 0425  WBC 16.6* 10.3 8.0  NEUTROABS 14.4* 8.3* 5.7  HGB 11.2* 9.8* 9.2*  HCT 35.8* 31.6* 30.8*  MCV 93.2 95.5 96.3  PLT 212 157 168   Basic Metabolic Panel: Recent Labs  Lab 05/29/24 1022 05/30/24 0458 05/31/24 0425 06/01/24 0346 06/02/24 0348 06/05/24 0249  NA 133* 136 138 142 143  --   K 4.4 4.3 4.3 4.1 4.2  --   CL 98 105 106 107 103  --   CO2 22 20* 22 26 30   --   GLUCOSE 136* 107* 95 83 95  --   BUN 38* 33* 28* 22 16  --   CREATININE 2.07* 1.84* 1.65* 1.42* 1.33* 1.25*  CALCIUM  9.1 8.1* 8.4* 8.9 9.6  --   MG  --  2.3 2.3  --   --   --   PHOS  --  2.8 2.6  --   --   --    Liver Function Tests: Recent Labs  Lab 05/29/24 1022 05/30/24 0458 05/31/24 0425  AST 37 31 78*  ALT 16 <5 8  ALKPHOS 109 90 133*  BILITOT 0.4 0.4 0.3  PROT 7.0 5.7* 5.7*  ALBUMIN  4.1 3.1* 3.2*   CBG: Recent Labs  Lab 05/31/24 1241  GLUCAP 127*    Discharge time spent: less than 30 minutes.  Signed: Toribio Door, MD [facility] 06/05/2024

## 2024-06-05 NOTE — TOC Transition Note (Signed)
 Transition of Care [facility]) - Discharge Note   Patient Details  Name: Valerie [name] MRN: [id number] Date of Birth: 04-03-1961  Transition of Care (TOC) CM/SW Contact:  NORMAN ASPEN, LCSW Phone Number: 06/05/2024, 10:30 AM   Clinical Narrative:    Met with pt this morning who reports she feels she has continued to progress in mobility and safety and no longer wishes to proceed with SNF.  PT has been able to work with her again today and confirms has met goals to dc home alone with [facility] services.  Orders placed for HHPT/OT and pt requests [facility] - they have accepted referral.  Pt has needed DME in the home.  No further IP CM needs.   Final next level of care: Home w Home Health Services Barriers to Discharge: Barriers Resolved   Patient Goals and CMS Choice Patient states their goals for this hospitalization and ongoing recovery are:: Return home CMS Medicare.gov Compare Post Acute Care list provided to:: Patient Choice offered to / list presented to : Patient [place] ownership interest in [facility].provided to:: Patient    Discharge Placement                       Discharge Plan and Services Additional resources added to the After Visit Summary for   In-house Referral: NA Discharge Planning Services: CM Consult Post Acute Care Choice: Skilled Nursing Facility          DME Arranged: N/A DME Agency: NA       HH Arranged: PT, OT HH Agency: [facility] Date HH Agency Contacted: 06/05/24 Time HH Agency Contacted: 1030 Representative spoke with at [facility] Agency: [facility]  Social Drivers of Health (SDOH) Interventions SDOH Screenings   Food Insecurity: No Food Insecurity (05/30/2024)  Housing: Low Risk  (05/30/2024)  Transportation Needs: No Transportation Needs (05/30/2024)  Utilities: Not At Risk (05/30/2024)  Alcohol Screen: Low Risk  (12/04/2022)  Depression (PHQ2-9): Medium Risk (09/21/2023)  Financial Resource Strain: High Risk  (01/11/2023)  Physical Activity: Inactive (09/21/2023)  Social Connections: Socially Isolated (10/21/2023)  Stress: Stress Concern Present (09/21/2023)  Tobacco Use: Medium Risk (05/29/2024)     Readmission Risk Interventions    05/31/2024    3:22 PM  Readmission Risk Prevention Plan  Transportation Screening Complete  Medication Review (RN Care Manager) Complete  PCP or Specialist appointment within 3-5 days of discharge Complete  HRI or Home Care Consult Complete  SW Recovery Care/Counseling Consult Complete  Palliative Care Screening Not Applicable  Skilled Nursing Facility Complete

## 2024-06-30 ENCOUNTER — Observation Stay (HOSPITAL_COMMUNITY)

## 2024-06-30 ENCOUNTER — Encounter (HOSPITAL_COMMUNITY): Payer: Self-pay

## 2024-06-30 ENCOUNTER — Emergency Department (HOSPITAL_COMMUNITY)

## 2024-06-30 ENCOUNTER — Other Ambulatory Visit: Payer: Self-pay

## 2024-06-30 ENCOUNTER — Inpatient Hospital Stay (HOSPITAL_COMMUNITY)
Admission: EM | Admit: 2024-06-30 | Discharge: 2024-07-04 | DRG: 871 | Disposition: A | Discharge status: Home / Self Care | Attending: Internal Medicine | Admitting: Internal Medicine

## 2024-06-30 DIAGNOSIS — E1169 Type 2 diabetes mellitus with other specified complication: Secondary | ICD-10-CM | POA: Diagnosis present

## 2024-06-30 DIAGNOSIS — D638 Anemia in other chronic diseases classified elsewhere: Secondary | ICD-10-CM | POA: Diagnosis present

## 2024-06-30 DIAGNOSIS — Z7982 Long term (current) use of aspirin: Secondary | ICD-10-CM

## 2024-06-30 DIAGNOSIS — I252 Old myocardial infarction: Secondary | ICD-10-CM

## 2024-06-30 DIAGNOSIS — N1832 Chronic kidney disease, stage 3b: Secondary | ICD-10-CM | POA: Diagnosis not present

## 2024-06-30 DIAGNOSIS — G20A1 Parkinson's disease without dyskinesia, without mention of fluctuations: Secondary | ICD-10-CM | POA: Diagnosis present

## 2024-06-30 DIAGNOSIS — A4151 Sepsis due to Escherichia coli [E. coli]: Principal | ICD-10-CM | POA: Diagnosis present

## 2024-06-30 DIAGNOSIS — N3001 Acute cystitis with hematuria: Secondary | ICD-10-CM

## 2024-06-30 DIAGNOSIS — Z8673 Personal history of transient ischemic attack (TIA), and cerebral infarction without residual deficits: Secondary | ICD-10-CM

## 2024-06-30 DIAGNOSIS — Z9049 Acquired absence of other specified parts of digestive tract: Secondary | ICD-10-CM

## 2024-06-30 DIAGNOSIS — F419 Anxiety disorder, unspecified: Secondary | ICD-10-CM | POA: Diagnosis present

## 2024-06-30 DIAGNOSIS — I10 Essential (primary) hypertension: Secondary | ICD-10-CM | POA: Diagnosis present

## 2024-06-30 DIAGNOSIS — E7849 Other hyperlipidemia: Secondary | ICD-10-CM | POA: Diagnosis present

## 2024-06-30 DIAGNOSIS — Z96643 Presence of artificial hip joint, bilateral: Secondary | ICD-10-CM | POA: Diagnosis present

## 2024-06-30 DIAGNOSIS — N136 Pyonephrosis: Secondary | ICD-10-CM | POA: Diagnosis present

## 2024-06-30 DIAGNOSIS — N390 Urinary tract infection, site not specified: Secondary | ICD-10-CM | POA: Diagnosis present

## 2024-06-30 DIAGNOSIS — Z79899 Other long term (current) drug therapy: Secondary | ICD-10-CM

## 2024-06-30 DIAGNOSIS — Z90710 Acquired absence of both cervix and uterus: Secondary | ICD-10-CM

## 2024-06-30 DIAGNOSIS — Z59868 Other specified financial insecurity: Secondary | ICD-10-CM

## 2024-06-30 DIAGNOSIS — E1122 Type 2 diabetes mellitus with diabetic chronic kidney disease: Secondary | ICD-10-CM | POA: Diagnosis present

## 2024-06-30 DIAGNOSIS — Z6835 Body mass index (BMI) 35.0-35.9, adult: Secondary | ICD-10-CM

## 2024-06-30 DIAGNOSIS — I959 Hypotension, unspecified: Secondary | ICD-10-CM | POA: Diagnosis present

## 2024-06-30 DIAGNOSIS — Z95828 Presence of other vascular implants and grafts: Secondary | ICD-10-CM

## 2024-06-30 DIAGNOSIS — G309 Alzheimer's disease, unspecified: Secondary | ICD-10-CM | POA: Diagnosis present

## 2024-06-30 DIAGNOSIS — R531 Weakness: Secondary | ICD-10-CM | POA: Diagnosis not present

## 2024-06-30 DIAGNOSIS — Z8619 Personal history of other infectious and parasitic diseases: Secondary | ICD-10-CM

## 2024-06-30 DIAGNOSIS — Z87440 Personal history of urinary (tract) infections: Secondary | ICD-10-CM

## 2024-06-30 DIAGNOSIS — F0283 Dementia in other diseases classified elsewhere, unspecified severity, with mood disturbance: Secondary | ICD-10-CM | POA: Diagnosis present

## 2024-06-30 DIAGNOSIS — G40909 Epilepsy, unspecified, not intractable, without status epilepticus: Secondary | ICD-10-CM | POA: Diagnosis present

## 2024-06-30 DIAGNOSIS — K219 Gastro-esophageal reflux disease without esophagitis: Secondary | ICD-10-CM | POA: Diagnosis present

## 2024-06-30 DIAGNOSIS — Z96651 Presence of right artificial knee joint: Secondary | ICD-10-CM | POA: Diagnosis present

## 2024-06-30 DIAGNOSIS — I82B21 Chronic embolism and thrombosis of right subclavian vein: Secondary | ICD-10-CM | POA: Diagnosis present

## 2024-06-30 DIAGNOSIS — I2510 Atherosclerotic heart disease of native coronary artery without angina pectoris: Secondary | ICD-10-CM | POA: Diagnosis present

## 2024-06-30 DIAGNOSIS — F0284 Dementia in other diseases classified elsewhere, unspecified severity, with anxiety: Secondary | ICD-10-CM | POA: Diagnosis present

## 2024-06-30 DIAGNOSIS — E1140 Type 2 diabetes mellitus with diabetic neuropathy, unspecified: Secondary | ICD-10-CM | POA: Diagnosis present

## 2024-06-30 DIAGNOSIS — D631 Anemia in chronic kidney disease: Secondary | ICD-10-CM | POA: Diagnosis present

## 2024-06-30 DIAGNOSIS — N321 Vesicointestinal fistula: Secondary | ICD-10-CM | POA: Diagnosis present

## 2024-06-30 DIAGNOSIS — G629 Polyneuropathy, unspecified: Secondary | ICD-10-CM

## 2024-06-30 DIAGNOSIS — L4050 Arthropathic psoriasis, unspecified: Secondary | ICD-10-CM | POA: Diagnosis present

## 2024-06-30 DIAGNOSIS — I5033 Acute on chronic diastolic (congestive) heart failure: Secondary | ICD-10-CM | POA: Diagnosis present

## 2024-06-30 DIAGNOSIS — M1990 Unspecified osteoarthritis, unspecified site: Secondary | ICD-10-CM | POA: Diagnosis present

## 2024-06-30 DIAGNOSIS — I129 Hypertensive chronic kidney disease with stage 1 through stage 4 chronic kidney disease, or unspecified chronic kidney disease: Secondary | ICD-10-CM | POA: Diagnosis present

## 2024-06-30 DIAGNOSIS — A419 Sepsis, unspecified organism: Secondary | ICD-10-CM | POA: Diagnosis present

## 2024-06-30 DIAGNOSIS — I708 Atherosclerosis of other arteries: Secondary | ICD-10-CM | POA: Diagnosis present

## 2024-06-30 DIAGNOSIS — G4733 Obstructive sleep apnea (adult) (pediatric): Secondary | ICD-10-CM | POA: Diagnosis present

## 2024-06-30 DIAGNOSIS — D849 Immunodeficiency, unspecified: Secondary | ICD-10-CM | POA: Diagnosis present

## 2024-06-30 DIAGNOSIS — F17290 Nicotine dependence, other tobacco product, uncomplicated: Secondary | ICD-10-CM | POA: Diagnosis present

## 2024-06-30 DIAGNOSIS — E119 Type 2 diabetes mellitus without complications: Secondary | ICD-10-CM

## 2024-06-30 DIAGNOSIS — E66812 Obesity, class 2: Secondary | ICD-10-CM | POA: Diagnosis present

## 2024-06-30 DIAGNOSIS — E785 Hyperlipidemia, unspecified: Secondary | ICD-10-CM

## 2024-06-30 DIAGNOSIS — Z888 Allergy status to other drugs, medicaments and biological substances status: Secondary | ICD-10-CM

## 2024-06-30 DIAGNOSIS — Z7952 Long term (current) use of systemic steroids: Secondary | ICD-10-CM

## 2024-06-30 DIAGNOSIS — Z792 Long term (current) use of antibiotics: Secondary | ICD-10-CM

## 2024-06-30 DIAGNOSIS — Z6841 Body Mass Index (BMI) 40.0 and over, adult: Secondary | ICD-10-CM

## 2024-06-30 DIAGNOSIS — F17210 Nicotine dependence, cigarettes, uncomplicated: Secondary | ICD-10-CM | POA: Diagnosis present

## 2024-06-30 DIAGNOSIS — F319 Bipolar disorder, unspecified: Secondary | ICD-10-CM | POA: Diagnosis present

## 2024-06-30 LAB — URINALYSIS, W/ REFLEX TO CULTURE (INFECTION SUSPECTED)
Bacteria, UA: NONE SEEN
Bilirubin Urine: NEGATIVE
Glucose, UA: NEGATIVE mg/dL
Hgb urine dipstick: NEGATIVE
Ketones, ur: NEGATIVE mg/dL
Nitrite: NEGATIVE
Protein, ur: 30 mg/dL — AB
Specific Gravity, Urine: 1.017 (ref 1.005–1.030)
WBC, UA: >50 WBC/hpf (ref 0–5)
pH: 6 (ref 5.0–8.0)

## 2024-06-30 LAB — CBG MONITORING, ED
Glucose-Capillary: 114 mg/dL — ABNORMAL HIGH (ref 70–99)
Glucose-Capillary: 83 mg/dL (ref 70–99)
Glucose-Capillary: 89 mg/dL (ref 70–99)

## 2024-06-30 LAB — I-STAT CG4 LACTIC ACID, ED
Lactic Acid, Venous: 1.3 mmol/L (ref 0.5–1.9)
Lactic Acid, Venous: 1.6 mmol/L (ref 0.5–1.9)

## 2024-06-30 LAB — CBC WITH DIFFERENTIAL/PLATELET
Abs Immature Granulocytes: 0.13 K/uL — ABNORMAL HIGH (ref 0.00–0.07)
Basophils Absolute: 0.1 K/uL (ref 0.0–0.1)
Basophils Relative: 0 %
Eosinophils Absolute: 0.5 K/uL (ref 0.0–0.5)
Eosinophils Relative: 4 %
HCT: 35.4 % — ABNORMAL LOW (ref 36.0–46.0)
Hemoglobin: 11.2 g/dL — ABNORMAL LOW (ref 12.0–15.0)
Immature Granulocytes: 1 %
Lymphocytes Relative: 4 %
Lymphs Abs: 0.6 K/uL — ABNORMAL LOW (ref 0.7–4.0)
MCH: 30.5 pg (ref 26.0–34.0)
MCHC: 31.6 g/dL (ref 30.0–36.0)
MCV: 96.5 fL (ref 80.0–100.0)
Monocytes Absolute: 0.5 K/uL (ref 0.1–1.0)
Monocytes Relative: 4 %
Neutro Abs: 13.2 K/uL — ABNORMAL HIGH (ref 1.7–7.7)
Neutrophils Relative %: 87 %
Platelets: 180 K/uL (ref 150–400)
RBC: 3.67 MIL/uL — ABNORMAL LOW (ref 3.87–5.11)
RDW: 15.9 % — ABNORMAL HIGH (ref 11.5–15.5)
WBC: 15 K/uL — ABNORMAL HIGH (ref 4.0–10.5)
nRBC: 0 % (ref 0.0–0.2)

## 2024-06-30 LAB — COMPREHENSIVE METABOLIC PANEL WITH GFR
ALT: 14 U/L (ref 0–44)
AST: 20 U/L (ref 15–41)
Albumin: 3.3 g/dL — ABNORMAL LOW (ref 3.5–5.0)
Alkaline Phosphatase: 88 U/L (ref 38–126)
Anion gap: 12 (ref 5–15)
BUN: 18 mg/dL (ref 8–23)
CO2: 26 mmol/L (ref 22–32)
Calcium: 8.5 mg/dL — ABNORMAL LOW (ref 8.9–10.3)
Chloride: 100 mmol/L (ref 98–111)
Creatinine, Ser: 1.58 mg/dL — ABNORMAL HIGH (ref 0.44–1.00)
GFR, Estimated: 36 mL/min — ABNORMAL LOW (ref >60)
Glucose, Bld: 121 mg/dL — ABNORMAL HIGH (ref 70–99)
Potassium: 4.5 mmol/L (ref 3.5–5.1)
Sodium: 138 mmol/L (ref 135–145)
Total Bilirubin: 0.4 mg/dL (ref 0.0–1.2)
Total Protein: 6.5 g/dL (ref 6.5–8.1)

## 2024-06-30 LAB — CK: Total CK: 51 U/L (ref 38–234)

## 2024-06-30 MED ORDER — SODIUM CHLORIDE 0.9 % IV SOLN
2.0000 g | Freq: Two times a day (BID) | INTRAVENOUS | Status: DC
  Administered 2024-07-01 – 2024-07-03 (×5): 2 g via INTRAVENOUS
  Filled 2024-06-30 (×5): qty 12.5

## 2024-06-30 MED ORDER — GABAPENTIN 300 MG PO CAPS: 300.0000 mg | ORAL_CAPSULE | ORAL | Status: DC

## 2024-06-30 MED ORDER — LACTATED RINGERS IV SOLN
150.0000 mL/h | INTRAVENOUS | Status: AC
  Administered 2024-06-30 – 2024-07-01 (×2): 150 mL/h via INTRAVENOUS

## 2024-06-30 MED ORDER — LACTATED RINGERS IV BOLUS (SEPSIS)
1000.0000 mL | Freq: Once | INTRAVENOUS | Status: AC
  Administered 2024-06-30: 1000 mL via INTRAVENOUS

## 2024-06-30 MED ORDER — VENLAFAXINE HCL ER 75 MG PO CP24
75.0000 mg | ORAL_CAPSULE | Freq: Every day | ORAL | Status: DC
  Administered 2024-07-01 – 2024-07-04 (×4): 75 mg via ORAL
  Filled 2024-06-30 (×4): qty 1

## 2024-06-30 MED ORDER — GABAPENTIN 300 MG PO CAPS
600.0000 mg | ORAL_CAPSULE | Freq: Every day | ORAL | Status: DC
  Administered 2024-06-30 – 2024-07-03 (×4): 600 mg via ORAL
  Filled 2024-06-30 (×4): qty 2

## 2024-06-30 MED ORDER — ASPIRIN 81 MG PO TBEC
81.0000 mg | DELAYED_RELEASE_TABLET | Freq: Every day | ORAL | Status: DC
  Administered 2024-07-01 – 2024-07-04 (×4): 81 mg via ORAL
  Filled 2024-06-30 (×4): qty 1

## 2024-06-30 MED ORDER — TRAZODONE HCL 50 MG PO TABS
200.0000 mg | ORAL_TABLET | Freq: Every day | ORAL | Status: DC
  Administered 2024-06-30 – 2024-07-03 (×4): 200 mg via ORAL
  Filled 2024-06-30 (×4): qty 4

## 2024-06-30 MED ORDER — SODIUM CHLORIDE 0.9 % IV SOLN
2.0000 g | Freq: Once | INTRAVENOUS | Status: AC
  Administered 2024-06-30: 2 g via INTRAVENOUS
  Filled 2024-06-30: qty 12.5

## 2024-06-30 MED ORDER — ROSUVASTATIN CALCIUM 5 MG PO TABS
10.0000 mg | ORAL_TABLET | Freq: Every day | ORAL | Status: DC
  Filled 2024-06-30 (×4): qty 2

## 2024-06-30 MED ORDER — LACTATED RINGERS IV BOLUS (SEPSIS)
500.0000 mL | Freq: Once | INTRAVENOUS | Status: AC
  Administered 2024-06-30: 500 mL via INTRAVENOUS

## 2024-06-30 MED ORDER — CARBIDOPA-LEVODOPA 25-100 MG PO TABS
1.0000 | ORAL_TABLET | Freq: Two times a day (BID) | ORAL | Status: DC
  Administered 2024-06-30 – 2024-07-04 (×8): 1 via ORAL
  Filled 2024-06-30 (×8): qty 1

## 2024-06-30 MED ORDER — GABAPENTIN 300 MG PO CAPS
300.0000 mg | ORAL_CAPSULE | Freq: Every morning | ORAL | Status: DC
  Administered 2024-07-01 – 2024-07-04 (×4): 300 mg via ORAL
  Filled 2024-06-30 (×4): qty 1

## 2024-06-30 MED ORDER — SODIUM CHLORIDE 0.9% FLUSH
3.0000 mL | Freq: Two times a day (BID) | INTRAVENOUS | Status: DC
  Administered 2024-06-30 – 2024-07-04 (×8): 3 mL via INTRAVENOUS

## 2024-06-30 MED ORDER — LEVETIRACETAM 250 MG PO TABS
750.0000 mg | ORAL_TABLET | Freq: Two times a day (BID) | ORAL | Status: DC
  Administered 2024-06-30 – 2024-07-04 (×8): 750 mg via ORAL
  Filled 2024-06-30 (×7): qty 3
  Filled 2024-06-30: qty 1

## 2024-06-30 MED ORDER — TRAMADOL HCL 50 MG PO TABS
50.0000 mg | ORAL_TABLET | Freq: Four times a day (QID) | ORAL | Status: DC | PRN
  Administered 2024-06-30 – 2024-07-02 (×2): 50 mg via ORAL
  Filled 2024-06-30 (×2): qty 1

## 2024-06-30 MED ORDER — ACETAMINOPHEN 650 MG RE SUPP
650.0000 mg | Freq: Four times a day (QID) | RECTAL | Status: DC | PRN
Start: 2024-06-30 — End: 2024-07-02

## 2024-06-30 MED ORDER — MEMANTINE HCL 10 MG PO TABS
10.0000 mg | ORAL_TABLET | Freq: Two times a day (BID) | ORAL | Status: DC
  Administered 2024-06-30 – 2024-07-04 (×8): 10 mg via ORAL
  Filled 2024-06-30 (×8): qty 1

## 2024-06-30 MED ORDER — POLYETHYLENE GLYCOL 3350 17 G PO PACK: 17.0000 g | PACK | Freq: Every day | ORAL | Status: DC | PRN

## 2024-06-30 MED ORDER — DONEPEZIL HCL 10 MG PO TABS
10.0000 mg | ORAL_TABLET | Freq: Every day | ORAL | Status: DC
  Administered 2024-07-01 – 2024-07-04 (×4): 10 mg via ORAL
  Filled 2024-06-30 (×4): qty 1

## 2024-06-30 MED ORDER — LACTATED RINGERS IV BOLUS (SEPSIS)
1000.0000 mL | Freq: Once | INTRAVENOUS | Status: AC
Start: 2024-06-30 — End: 2024-06-30
  Administered 2024-06-30: 1000 mL via INTRAVENOUS

## 2024-06-30 MED ORDER — LAMOTRIGINE 100 MG PO TABS
150.0000 mg | ORAL_TABLET | Freq: Two times a day (BID) | ORAL | Status: DC
  Administered 2024-06-30 – 2024-07-04 (×8): 150 mg via ORAL
  Filled 2024-06-30 (×8): qty 2

## 2024-06-30 MED ORDER — INSULIN ASPART 100 UNIT/ML IJ SOLN
0.0000 [IU] | Freq: Three times a day (TID) | INTRAMUSCULAR | Status: DC
  Administered 2024-07-01 – 2024-07-03 (×3): 2 [IU] via SUBCUTANEOUS
  Filled 2024-06-30 (×3): qty 2

## 2024-06-30 MED ORDER — ACETAMINOPHEN 325 MG PO TABS: 650.0000 mg | ORAL_TABLET | Freq: Four times a day (QID) | ORAL | Status: DC | PRN

## 2024-06-30 MED ORDER — ENOXAPARIN SODIUM 40 MG/0.4ML IJ SOSY
40.0000 mg | PREFILLED_SYRINGE | INTRAMUSCULAR | Status: DC
  Administered 2024-06-30 – 2024-07-03 (×4): 40 mg via SUBCUTANEOUS
  Filled 2024-06-30 (×4): qty 0.4

## 2024-06-30 NOTE — Sepsis Progress Note (Signed)
 Confirmed blood cultures were drawn before antibiotic started.

## 2024-06-30 NOTE — ED Triage Notes (Signed)
 Patient states that she fell and she has a UTI. She states her muscles are so stiff she can't move. Was previously admitted to the hospital for sepsis 2 weeks ago. She states that she fell this morning and she had to call EMT to get her out of the floor. Denies injury.

## 2024-06-30 NOTE — ED Provider Notes (Addendum)
 [place] [facility] AT [facility] Provider Note   CSN: [id number] Arrival date & time: 06/30/24  1114     Patient presents with: No chief complaint on file.   Valerie [name] is a 63 y.o. female with medical history significant for CKD stage IIIb, history of colovesicular fistula, dementia, depression, diabetes mellitus type 2, hypertension, hypothyroidism neuromuscular disorder, history of sleep apnea and history of TIA/CVA and other comorbidities, recently discharged from hospital with sepsis and bacteremia secondary to urinary source who presents with dysuria and weakness. She states that she has had multiple urinary tract infections frequently and started developing dysuria and hematuria afew days ago and progressively got worse.  States that she is having burning and discomfort with urination.  Patient was seen by her primary care doctor who told her she had a UTI and prescribed Macrobid. However, today developed worsening weakness and myalgias.  This morning when she attempted to get out of bed she was so weak she could not stand and slid to the floor. She was unable to rise and call EMS for assistance and feels like this when ever she has urinary tract infection.  She denied any chest pain or shortness of breath and no headaches or lightheadedness but just feels weak    HPI     Prior to Admission medications   Medication Sig Start Date End Date Taking? Authorizing Provider  aspirin  EC 81 MG tablet Take 81 mg by mouth daily. Swallow whole.    [provider]  carbidopa -levodopa  (SINEMET  IR) 25-100 MG tablet Take 1 tablet by mouth in the morning and at bedtime. 08/27/22   [provider]  desvenlafaxine (PRISTIQ) 25 MG 24 hr tablet Take 25 mg by mouth daily. Patient taking differently: Take 50 mg by mouth daily. 03/13/24   [provider]  Docusate Calcium  (STOOL SOFTENER PO) Take 1 tablet by mouth in the morning and at bedtime.    [provider]  donepezil  (ARICEPT ) 10 MG tablet Take 10 mg by mouth daily. 10/30/19   [provider]  ENBREL 50 MG/ML injection Inject 50 mg into the skin once a week. Patient takes this medication on Mondays 12/16/23   [provider]  fluconazole  (DIFLUCAN ) 150 MG tablet Take 150 mg by mouth as needed (Recurring UTI). 07/29/23   [provider]  gabapentin  (NEURONTIN ) 300 MG capsule Take 300-600 mg by mouth See admin instructions. Take 300 mg in the morning and 600 mg at night 07/29/23   [provider]  lamoTRIgine  (LAMICTAL ) 150 MG tablet Take 1 tablet by mouth 2 (two) times daily. 12/28/22   [provider]  Lecanemab-irmb (LEQEMBI IV) Inject into the vein every 14 (fourteen) days.    [provider]  levETIRAcetam  (KEPPRA ) 750 MG tablet Take 750 mg by mouth 2 (two) times daily.    [provider]  levocetirizine (XYZAL ) 5 MG tablet Take 1 tablet (5 mg total) by mouth every evening. Patient taking differently: Take 5 mg by mouth as needed for allergies. 08/11/23   Rollene Almarie LABOR, MD  meclizine  (ANTIVERT ) 25 MG tablet Take 25 mg by mouth 3 (three) times daily as needed for dizziness.    [provider]  memantine  (NAMENDA ) 10 MG tablet Take 10 mg by mouth 2 (two) times daily. 03/18/22   [provider]  Multiple Vitamins-Minerals (MULTIVITAMIN WITH MINERALS) tablet Take 1 tablet by mouth at bedtime.    [provider]  nitroGLYCERIN  (NITROSTAT ) 0.4 MG  SL tablet Place 1 tablet (0.4 mg total) under the tongue every 5 (five) minutes as needed for chest pain. 05/03/23   Merlynn Niki FALCON, FNP  Omega-3 Fatty Acids (FISH OIL PO) Take 2 capsules by mouth daily.    [provider]  omeprazole  (PRILOSEC) 40 MG capsule Take 1 capsule (40 mg total) by mouth daily. Patient not taking: Reported on 05/29/2024 08/11/23   Rollene Almarie LABOR, MD  ondansetron  (ZOFRAN -ODT) 8 MG disintegrating tablet Take 8 mg by  mouth every 8 (eight) hours as needed for nausea or vomiting. Patient not taking: Reported on 05/29/2024 09/15/22   [provider]  oxybutynin  (DITROPAN ) 5 MG tablet Take 5 mg by mouth 2 (two) times daily. 09/13/23   [provider]  polyethylene glycol (MIRALAX  / GLYCOLAX ) 17 g packet Take 17 g by mouth 2 (two) times daily. Patient taking differently: Take 17 g by mouth daily as needed for mild constipation. 07/28/23   Briana Elgin LABOR, MD  predniSONE  (DELTASONE ) 5 MG tablet Take 10 mg by mouth daily.    [provider]  primidone  (MYSOLINE ) 50 MG tablet Take 100 mg by mouth 2 (two) times daily. 04/09/22   [provider]  rizatriptan  (MAXALT ) 10 MG tablet Take 10 mg by mouth as needed for migraine. May repeat in 2 hours if needed    [provider]  rosuvastatin  (CRESTOR ) 10 MG tablet Take 1 tablet (10 mg total) by mouth daily. Patient taking differently: Take 10 mg by mouth at bedtime. 12/04/22   Rollene Almarie LABOR, MD  sennosides-docusate sodium  (SENOKOT-S) 8.6-50 MG tablet Take 2 tablets by mouth daily. 10/19/23   Josefina Chew, MD  tiZANidine  (ZANAFLEX ) 4 MG tablet Take 1 tablet (4 mg total) by mouth 3 (three) times daily. Patient taking differently: Take 4 mg by mouth as needed for muscle spasms. 01/05/24   Danton Lauraine LABOR, PA-C  traZODone  (DESYREL ) 100 MG tablet Take 2 tablets (200 mg total) by mouth at bedtime. 07/23/20   Tonnie George, PA-C    Allergies: Carbamazepine and Sertraline hcl    Review of Systems  Updated Vital Signs BP (!) 93/58 (BP Location: Right Arm)   Pulse 78   Temp 98.5 F (36.9 C) (Oral)   Resp 18   Ht 5' 2 (1.575 m)   Wt 103 kg   SpO2 95%   BMI 41.53 kg/m   Physical Exam Vitals and nursing note reviewed.  Constitutional:      Appearance: She is well-developed. She is ill-appearing.     Comments: Tearful  HENT:     Head: Normocephalic and atraumatic.  Eyes:     Conjunctiva/sclera: Conjunctivae normal.   Cardiovascular:     Rate and Rhythm: Normal rate and regular rhythm.     Heart sounds: No murmur heard. Pulmonary:     Effort: Pulmonary effort is normal. No respiratory distress.     Breath sounds: Normal breath sounds.  Abdominal:     Palpations: Abdomen is soft.     Tenderness: There is abdominal tenderness (Suprapubic).  Musculoskeletal:        General: No swelling.     Cervical back: Neck supple.  Skin:    General: Skin is warm and dry.     Capillary Refill: Capillary refill takes less than 2 seconds.  Neurological:     Mental Status: She is alert.     (all labs ordered are listed, but only abnormal results are displayed) Labs Reviewed  COMPREHENSIVE METABOLIC PANEL WITH  GFR - Abnormal; Notable for the following components:      Result Value   Glucose, Bld 121 (*)    Creatinine, Ser 1.58 (*)    Calcium  8.5 (*)    Albumin  3.3 (*)    GFR, Estimated 36 (*)    All other components within normal limits  CBC WITH DIFFERENTIAL/PLATELET - Abnormal; Notable for the following components:   WBC 15.0 (*)    RBC 3.67 (*)    Hemoglobin 11.2 (*)    HCT 35.4 (*)    RDW 15.9 (*)    Neutro Abs 13.2 (*)    Lymphs Abs 0.6 (*)    Abs Immature Granulocytes 0.13 (*)    All other components within normal limits  URINALYSIS, W/ REFLEX TO CULTURE (INFECTION SUSPECTED) - Abnormal; Notable for the following components:   APPearance HAZY (*)    Protein, ur 30 (*)    Leukocytes,Ua LARGE (*)    All other components within normal limits  CBG MONITORING, ED - Abnormal; Notable for the following components:   Glucose-Capillary 114 (*)    All other components within normal limits  CULTURE, BLOOD (ROUTINE X 2)  CULTURE, BLOOD (ROUTINE X 2)  URINE CULTURE  CK  I-STAT CG4 LACTIC ACID, ED  I-STAT CG4 LACTIC ACID, ED    EKG: None  Radiology: DG Chest 2 View Result Date: 06/30/2024 EXAM: 2 VIEW(S) XRAY OF THE CHEST 06/30/2024 12:24:00 PM COMPARISON: Chest radiograph 05/29/2024. CLINICAL  HISTORY: Hypoxia. FINDINGS: LINES, TUBES AND DEVICES: Right chest port-a-cath tip overlies the mid to lower SVC. LUNGS AND PLEURA: No focal pulmonary opacity. No pulmonary edema. No pleural effusion. No pneumothorax. HEART AND MEDIASTINUM: No acute abnormality of the cardiac silhouette. BONES AND SOFT TISSUES: Degenerative changes of the thoracic spine. No acute osseous abnormality. IMPRESSION: 1. No acute cardiopulmonary findings. Electronically signed by: Shahmeer Marcelino MD 06/30/2024 01:37 PM EST RP Workstation: [id number]     Procedures   Medications Ordered in the ED  lactated ringers  infusion (has no administration in time range)  lactated ringers  bolus 1,000 mL (1,000 mLs Intravenous New Bag/Given 06/30/24 1531)    And  lactated ringers  bolus 1,000 mL (1,000 mLs Intravenous New Bag/Given 06/30/24 1544)    And  lactated ringers  bolus 1,000 mL (has no administration in time range)    And  lactated ringers  bolus 500 mL (has no administration in time range)  ceFEPIme (MAXIPIME) 2 g in sodium chloride  0.9 % 100 mL IVPB (2 g Intravenous New Bag/Given 06/30/24 1530)    Clinical Course as of 06/30/24 1604  Fri Jun 30, 2024  1507 63y F, recently d.c for ESBL bacteremia and urosepsis. Was on macrobid in outpatient setting. Generalized weakness, SIRS with leukocystosis and bacteremia and initiated cefepime.  []  admitted [SA]  1514 Comprehensive metabolic panel(!) No significant left-right abnormalities, creatinine consistent patient's baseline [LB]  1514 CBC with Differential(!) Significant leukocytosis [LB]  1514 Urinalysis, w/ Reflex to Culture (Infection Suspected) -Urine, Clean Catch(!) Large leukesterase and proteinuria concerning for UTI, on outpatient antibiotics [LB]  1515 DG Chest 2 View No consolidation or opacity concerning for pneumonia [LB]  1515 ED EKG Normal sinus rhythm, no evidence of acute ischemia [LB]    Clinical Course User Index [LB] Sharlet Dowdy, MD [SA] Billy Pal, MD                                 Medical Decision Making  Patient is a 63 year old female with multiple medical comorbidities recently discharged after treatment for sepsis and bacteremia secondary to urinary source.  Patient has history of ESBL.  On exam, patient is ill-appearing.  Lungs are clear to auscultation bilaterally, patient endorses suprapubic tenderness on palpation.  On arrival, patient is found to be tachycardic and hypoxic which improved after 2 L nasal cannula.  Patient became hypotensive throughout her stay.  Of note, patient has an indwelling port that was recently accessed for Leqembi infusion that she is on for treatment of Alzheimer's.  Differential diagnosis includes but not limited to UTI, sepsis, electrolyte derangement  On evaluation, patient was found to trigger SIRS criteria with a white blood cell count of 15, heart rate greater than 100, and suspected urinary infection.  Because of this, code sepsis was initiated.  UA with large leukocyte esterase concerning for UTI in the setting of being on outpatient antibiotics.  2 sets of blood cultures were collected.  Patient was started on a 30 cc/kg IV fluid bolus with improvement in her hypotension. Cefepime initiated given patient's recent admission requiring IV antibiotics and history of ESBL, previous cultures have been cefepime sensitive.  Patient requires admission for continued IV antibiotics for treatment of sepsis from a urinary source.  Hospitalist was consulted for admission and agreed to admit this patient for continued treatment.  Please see admitting providers note for further manger of patient's care.  Amount and/or Complexity of Data Reviewed Independent Historian: friend External Data Reviewed: labs and notes. Labs: ordered. Decision-making details documented in ED Course. Radiology: ordered and independent interpretation performed. Decision-making details documented in ED Course. ECG/medicine tests:  ordered and independent interpretation performed. Decision-making details documented in ED Course.  Risk Prescription drug management. Decision regarding hospitalization.        Final diagnoses:  Weakness  Sepsis without acute organ dysfunction, due to unspecified organism [facility])  Acute cystitis with hematuria    ED Discharge Orders     None          Sharlet Dowdy, MD 06/30/24 1517    Sharlet Dowdy, MD 06/30/24 1518    Sharlet Dowdy, MD 06/30/24 1604    Valerie Jayson LABOR, DO 07/07/24 2343

## 2024-06-30 NOTE — ED Notes (Signed)
 ED Pharmacy Antibiotic Sign Off An antibiotic consult was received from an ED provider for cefepime per pharmacy dosing for UTI. A chart review was completed to assess appropriateness.   The following one time order(s) were placed:  Cefepime 2g  Further antibiotic and/or antibiotic pharmacy consults should be ordered by the admitting provider if indicated.   Thank you for allowing pharmacy to be a part of this patient's care.   Leonor GORMAN Bash, [facility]  Clinical Pharmacist 06/30/24 2:30 PM

## 2024-06-30 NOTE — Sepsis Progress Note (Signed)
Notified bedside nurse of need to draw and administer antibiotics and blood cultures.  

## 2024-06-30 NOTE — H&P (Signed)
 History and Physical   Valerie [name] FMW:[id number] DOB: [birth year] DOA: 06/30/2024  PCP: Arloa Jarvis, NP   Patient coming from: Home  Chief Complaint: Dysuria, weakness  HPI: Valerie [name] is a 63 y.o. 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  Patient reports history of recurrent UTI.  Also recently admitted 10/6-10/13 with sepsis secondary to UTI that was treated with 7 days of ceftriaxone .  Also treated for AKI which improved with fluids.  Patient had been doing well since that admission however for the past 2 to 3 days she has had worsening dysuria and hematuria.  Saw her PCP and was diagnosed with a UTI and started on Macrobid.  However, symptoms have continued to worsen and today she had severe weakness and myalgia and had been unable to walk.  Called EMS for transport to the ED.  Denies fevers, chills, chest pain, shortness of breath, abdominal pain, constipation, diarrhea, nausea, vomiting.  ED Course: Vital signs in the ED notable for blood pressure in the 90s-100 systolic, heart rate in the 70s-100s.  Requiring 2 L to maintain saturations.  Lab workup included CMP with creatinine stable at 1.58, glucose 121, calcium  8.5, albumin  3.3.  CBC with leukocytosis to 15, hemoglobin elevated from baseline at 11.2.  Lactic acid normal with repeat pending.  CK pending.  Urinalysis with protein, leukocytes but no bacteria.  Urine culture and blood culture pending.  Chest x-ray showed no acute abnormality.  Patient received cefepime, 3.5 L IV fluids and started on a rate of fluids at 150 cc an hour.  Review of Systems: As per HPI otherwise all other systems reviewed and are  negative.  Past Medical History:  Diagnosis Date   Anemia    Anxiety    Arthritis    Psoriatic   Bipolar disorder (HCC)    CKD (chronic kidney disease), stage III (HCC)    Colovesical fistula    Dementia (HCC)    Depression    DM type 2 (diabetes mellitus, type 2) (HCC) 11/14/2019   Dyspnea    E coli bacteremia 07/28/2023   Headache    Hyperthyroidism    Neuromuscular disorder (HCC)    Neuropathy    NSTEMI (non-ST elevated myocardial infarction) (HCC) 11/14/2019   OSA (obstructive sleep apnea)    Primary localized osteoarthritis of right knee 10/19/2023   Psoriatic arthritis (HCC)    Pyelonephritis 07/23/2023   Sleep apnea    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   Stroke (HCC)    ?history; MRI normal    Past Surgical History:  Procedure Laterality Date   ABDOMINAL HYSTERECTOMY     APPENDECTOMY     CHOLECYSTECTOMY     CYSTOSCOPY WITH STENT PLACEMENT N/A 07/17/2020   Procedure: CYSTOSCOPY WITH BILATERAL FIREFLY INJECTION;  Surgeon: Renda Glance, MD;  Location: [facility];  Service: Urology;  Laterality: N/A;   FLEXIBLE SIGMOIDOSCOPY N/A 05/17/2020   Procedure: FLEXIBLE SIGMOIDOSCOPY;  Surgeon: Rollin Dover, MD;  Location: [facility];  Service: Endoscopy;  Laterality: N/A;   FOOT SURGERY Right    IR FLUORO GUIDE CV LINE RIGHT  08/19/2020   IR IMAGING GUIDED PORT INSERTION  02/04/2024   IR RADIOLOGIST EVAL & MGMT  09/10/2020   IR RADIOLOGIST EVAL & MGMT  10/10/2020   IR REMOVAL TUN  CV CATH W/O FL  09/16/2020   IR SINUS/FIST TUBE CHK-NON GI  10/24/2020   IR SINUS/FIST TUBE CHK-NON GI  10/31/2020   IR US  GUIDE VASC ACCESS RIGHT  08/19/2020   LEFT HEART CATH AND CORONARY ANGIOGRAPHY N/A 11/14/2019   Procedure: LEFT HEART CATH AND CORONARY ANGIOGRAPHY;  Surgeon: Jordan, Peter M, MD;  Location: [facility] INVASIVE CV LAB;  Service: Cardiovascular;  Laterality: N/A;   ORIF PERIPROSTHETIC FRACTURE Right 01/03/2024   Procedure: OPEN REDUCTION INTERNAL FIXATION (ORIF) PERIPROSTHETIC  FRACTURE;  Surgeon: Kendal Franky SQUIBB, MD;  Location: [facility];  Service: Orthopedics;  Laterality: Right;   PARATHYROID  EXPLORATION N/A 01/09/2021   Procedure: POSSIBLE NECK EXPLORATION;  Surgeon: Eletha Boas, MD;  Location: [facility];  Service: General;  Laterality: N/A;   PARATHYROIDECTOMY Left 01/09/2021   Procedure: LEFT INFERIOR PARATHYROIDECTOMY;  Surgeon: Eletha Boas, MD;  Location: [facility];  Service: General;  Laterality: Left;   PROCTOSCOPY N/A 07/17/2020   Procedure: RIGID PROCTOSCOPY;  Surgeon: Sheldon Standing, MD;  Location: [facility];  Service: General;  Laterality: N/A;   TONSILLECTOMY     TOTAL HIP ARTHROPLASTY Bilateral    TOTAL KNEE ARTHROPLASTY Right 10/19/2023   Procedure: TOTAL KNEE ARTHROPLASTY;  Surgeon: Josefina Chew, MD;  Location: [facility];  Service: Orthopedics;  Laterality: Right;    Social History  reports that she has quit smoking. Her smoking use included cigarettes and e-cigarettes. She has been exposed to tobacco smoke. She has never used smokeless tobacco. She reports current alcohol use. She reports that she does not use drugs.  Allergies  Allergen Reactions   Carbamazepine Other (See Comments)    Parkinsons like symptoms, tremors   Sertraline Hcl Other (See Comments)    Parkinsons like symptoms    Family History  Problem Relation Age of Onset   Breast cancer Neg Hx   Reviewed on admission  Prior to Admission medications   Medication Sig Start Date End Date Taking? Authorizing Provider  aspirin  EC 81 MG tablet Take 81 mg by mouth daily. Swallow whole.    [provider]  carbidopa -levodopa  (SINEMET  IR) 25-100 MG tablet Take 1 tablet by mouth in the morning and at bedtime. 08/27/22   [provider]  desvenlafaxine (PRISTIQ) 25 MG 24 hr tablet Take 25 mg by mouth daily. Patient taking differently: Take 50 mg by mouth daily. 03/13/24   [provider]  Docusate Calcium  (STOOL SOFTENER PO) Take 1 tablet by mouth in the morning and at bedtime.     [provider]  donepezil  (ARICEPT ) 10 MG tablet Take 10 mg by mouth daily. 10/30/19   [provider]  ENBREL 50 MG/ML injection Inject 50 mg into the skin once a week. Patient takes this medication on Mondays 12/16/23   [provider]  fluconazole  (DIFLUCAN ) 150 MG tablet Take 150 mg by mouth as needed (Recurring UTI). 07/29/23   [provider]  gabapentin  (NEURONTIN ) 300 MG capsule Take 300-600 mg by mouth See admin instructions. Take 300 mg in the morning and 600 mg at night 07/29/23   [provider]  lamoTRIgine  (LAMICTAL ) 150 MG tablet Take 1 tablet by mouth 2 (two) times daily. 12/28/22   [provider]  Lecanemab-irmb (LEQEMBI IV) Inject into the vein every 14 (fourteen) days.    [provider]  levETIRAcetam  (KEPPRA ) 750 MG tablet Take 750 mg by mouth 2 (two) times daily.    [provider]  levocetirizine (XYZAL ) 5 MG tablet Take 1 tablet (5  mg total) by mouth every evening. Patient taking differently: Take 5 mg by mouth as needed for allergies. 08/11/23   Rollene Almarie LABOR, MD  meclizine  (ANTIVERT ) 25 MG tablet Take 25 mg by mouth 3 (three) times daily as needed for dizziness.    [provider]  memantine  (NAMENDA ) 10 MG tablet Take 10 mg by mouth 2 (two) times daily. 03/18/22   [provider]  Multiple Vitamins-Minerals (MULTIVITAMIN WITH MINERALS) tablet Take 1 tablet by mouth at bedtime.    [provider]  nitroGLYCERIN  (NITROSTAT ) 0.4 MG SL tablet Place 1 tablet (0.4 mg total) under the tongue every 5 (five) minutes as needed for chest pain. 05/03/23   Merlynn Niki FALCON, FNP  Omega-3 Fatty Acids (FISH OIL PO) Take 2 capsules by mouth daily.    [provider]  omeprazole  (PRILOSEC) 40 MG capsule Take 1 capsule (40 mg total) by mouth daily. Patient not taking: Reported on 05/29/2024 08/11/23   Rollene Almarie LABOR, MD  ondansetron  (ZOFRAN -ODT) 8 MG disintegrating tablet Take  8 mg by mouth every 8 (eight) hours as needed for nausea or vomiting. Patient not taking: Reported on 05/29/2024 09/15/22   [provider]  oxybutynin  (DITROPAN ) 5 MG tablet Take 5 mg by mouth 2 (two) times daily. 09/13/23   [provider]  polyethylene glycol (MIRALAX  / GLYCOLAX ) 17 g packet Take 17 g by mouth 2 (two) times daily. Patient taking differently: Take 17 g by mouth daily as needed for mild constipation. 07/28/23   Briana Elgin LABOR, MD  predniSONE  (DELTASONE ) 5 MG tablet Take 10 mg by mouth daily.    [provider]  primidone  (MYSOLINE ) 50 MG tablet Take 100 mg by mouth 2 (two) times daily. 04/09/22   [provider]  rizatriptan  (MAXALT ) 10 MG tablet Take 10 mg by mouth as needed for migraine. May repeat in 2 hours if needed    [provider]  rosuvastatin  (CRESTOR ) 10 MG tablet Take 1 tablet (10 mg total) by mouth daily. Patient taking differently: Take 10 mg by mouth at bedtime. 12/04/22   Rollene Almarie LABOR, MD  sennosides-docusate sodium  (SENOKOT-S) 8.6-50 MG tablet Take 2 tablets by mouth daily. 10/19/23   Josefina Chew, MD  tiZANidine  (ZANAFLEX ) 4 MG tablet Take 1 tablet (4 mg total) by mouth 3 (three) times daily. Patient taking differently: Take 4 mg by mouth as needed for muscle spasms. 01/05/24   Danton Lauraine LABOR, PA-C  traZODone  (DESYREL ) 100 MG tablet Take 2 tablets (200 mg total) by mouth at bedtime. 07/23/20   Tonnie George, PA-C    Physical Exam: Vitals:   06/30/24 1156 06/30/24 1314 06/30/24 1344 06/30/24 1449  BP:  (!) 94/55  (!) 93/58  Pulse:  87  78  Resp:  16  18  Temp:  98.6 F (37 C)  98.5 F (36.9 C)  TempSrc:    Oral  SpO2:  95% 100% 95%  Weight: 103 kg     Height: 5' 2 (1.575 m)       Physical Exam Constitutional:      General: She is not in acute distress.    Appearance: Normal appearance. She is obese.  HENT:     Head: Normocephalic and atraumatic.     Mouth/Throat:     Mouth: Mucous  membranes are moist.     Pharynx: Oropharynx is clear.  Eyes:     Extraocular Movements: Extraocular movements intact.     Pupils: Pupils are equal, round, and  reactive to light.  Cardiovascular:     Rate and Rhythm: Normal rate and regular rhythm.     Pulses: Normal pulses.     Heart sounds: Normal heart sounds.  Pulmonary:     Effort: Pulmonary effort is normal. No respiratory distress.     Breath sounds: Normal breath sounds.  Abdominal:     General: Bowel sounds are normal. There is no distension.     Palpations: Abdomen is soft.     Tenderness: There is no abdominal tenderness.  Musculoskeletal:        General: No swelling or deformity.  Skin:    General: Skin is warm and dry.  Neurological:     General: No focal deficit present.     Mental Status: Mental status is at baseline.    Labs on Admission: I have personally reviewed following labs and imaging studies  CBC: Recent Labs  Lab 06/30/24 1201  WBC 15.0*  NEUTROABS 13.2*  HGB 11.2*  HCT 35.4*  MCV 96.5  PLT 180    Basic Metabolic Panel: Recent Labs  Lab 06/30/24 1201  NA 138  K 4.5  CL 100  CO2 26  GLUCOSE 121*  BUN 18  CREATININE 1.58*  CALCIUM  8.5*    GFR: Estimated Creatinine Clearance: 40.5 mL/min (A) (by C-G formula based on SCr of 1.58 mg/dL (H)).  Liver Function Tests: Recent Labs  Lab 06/30/24 1201  AST 20  ALT 14  ALKPHOS 88  BILITOT 0.4  PROT 6.5  ALBUMIN  3.3*    Urine analysis:    Component Value Date/Time   COLORURINE YELLOW 06/30/2024 1201   APPEARANCEUR HAZY (A) 06/30/2024 1201   APPEARANCEUR Clear 01/20/2024 0948   LABSPEC 1.017 06/30/2024 1201   PHURINE 6.0 06/30/2024 1201   GLUCOSEU NEGATIVE 06/30/2024 1201   HGBUR NEGATIVE 06/30/2024 1201   BILIRUBINUR NEGATIVE 06/30/2024 1201   BILIRUBINUR Negative 01/20/2024 0948   KETONESUR NEGATIVE 06/30/2024 1201   PROTEINUR 30 (A) 06/30/2024 1201   NITRITE NEGATIVE 06/30/2024 1201   LEUKOCYTESUR LARGE (A) 06/30/2024  1201    Radiological Exams on Admission: DG Chest 2 View Result Date: 06/30/2024 EXAM: 2 VIEW(S) XRAY OF THE CHEST 06/30/2024 12:24:00 PM COMPARISON: Chest radiograph 05/29/2024. CLINICAL HISTORY: Hypoxia. FINDINGS: LINES, TUBES AND DEVICES: Right chest port-a-cath tip overlies the mid to lower SVC. LUNGS AND PLEURA: No focal pulmonary opacity. No pulmonary edema. No pleural effusion. No pneumothorax. HEART AND MEDIASTINUM: No acute abnormality of the cardiac silhouette. BONES AND SOFT TISSUES: Degenerative changes of the thoracic spine. No acute osseous abnormality. IMPRESSION: 1. No acute cardiopulmonary findings. Electronically signed by: Harrietta Sherry MD 06/30/2024 01:37 PM EST RP Workstation: [id number]   EKG: Independently reviewed.  Sinus rhythm at 96 beats minute.  Nonspecific T wave changes.  Low voltage in multiple leads.  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 > Family confers concerned about recurrent UTI possibly being due to repeat of prior colovesical fistula or issue with port that patient has in place for infusions (as patient did have port in  place when she had E. coli bacteremia during last admission.).  Last CT of the abdomen pelvis was 1 year ago.  Repeat blood cultures are pending in the ED. > Meets sepsis criteria with tachycardia, hypoxia, leukocytosis to 15, urinary source. > Started on cefepime, 3.5 L bolus in the ED. - Monitor in progressive unit overnight - Continue with cefepime - Continue IV fluids - Trend fever curve and WBC - Follow-up urine culture and blood culture - Repeat CT abdomen pelvis to evaluate for recurrent fistula. - Supportive care  Hypertension - Not currently on antihypertensives  Hyperlipidemia - Continue home rosuvastatin   GERD - Not currently taking PPI  Diabetes - SSI  CKD 3B > Creatinine stable at 1.58 in the ED - Trend renal function and electrolytes - Receiving IV fluids as above  CAD Chronically occluded right subclavian artery - Continue home ASA, rosuvastatin   Seizure disorder - Continue home Lamictal , Keppra , gabapentin   Bipolar disorder Anxiety Dementia - Continue home Lamictal , trazodone  - Continue donepezil , Namenda  - Replace home desvenlafaxine with formulary venlafaxine  Psoriatic arthritis - Get Enbrel injections outpatient  Parkinson disease - Continue home Sinemet   OSA - Continue CPAP  Neuropathy - Continue home gabapentin   Obesity - Noted   DVT prophylaxis: Lovenox  Code Status:   Full Family Communication:  Updated at bedside  Disposition Plan:   Patient is from:  Home  Anticipated DC to:  Home  Anticipated DC date:  1 to 3 days  Anticipated DC barriers: None  Consults called:  None Admission status:  Observation, progressive  Severity of Illness: The appropriate patient status for this patient is OBSERVATION. Observation status is judged to be reasonable and necessary in order to provide the required intensity of service to ensure the patient's safety. The patient's presenting symptoms, physical exam findings, and initial radiographic  and laboratory data in the context of their medical condition is felt to place them at decreased risk for further clinical deterioration. Furthermore, it is anticipated that the patient will be medically stable for discharge from the hospital within 2 midnights of admission.    Marsa KATHEE Scurry MD [facility]  How to contact the [facility] Attending or Consulting provider 7A - 7P or covering provider during after hours 7P -7A, for this patient?   Check the care team in [facility] and look for a) attending/consulting [facility] provider listed and b) the [facility] team listed Log into [email or web address] and use [place]'s universal password to access. If you do not have the password, please contact the hospital operator. Locate the [facility] provider you are looking for under [facility] and page to a number that you can be directly reached. If you still have difficulty reaching the provider, please page the [facility] (Director on Call) for the Hospitalists listed on amion for assistance.  06/30/2024, 4:09 PM

## 2024-06-30 NOTE — Sepsis Progress Note (Signed)
 Code Sepsis protocol being monitored by eLink.

## 2024-07-01 DIAGNOSIS — N390 Urinary tract infection, site not specified: Secondary | ICD-10-CM | POA: Diagnosis not present

## 2024-07-01 DIAGNOSIS — A419 Sepsis, unspecified organism: Secondary | ICD-10-CM | POA: Diagnosis not present

## 2024-07-01 LAB — COMPREHENSIVE METABOLIC PANEL WITH GFR
ALT: 5 U/L (ref 0–44)
AST: 18 U/L (ref 15–41)
Albumin: 2.7 g/dL — ABNORMAL LOW (ref 3.5–5.0)
Alkaline Phosphatase: 67 U/L (ref 38–126)
Anion gap: 9 (ref 5–15)
BUN: 15 mg/dL (ref 8–23)
CO2: 29 mmol/L (ref 22–32)
Calcium: 8.5 mg/dL — ABNORMAL LOW (ref 8.9–10.3)
Chloride: 104 mmol/L (ref 98–111)
Creatinine, Ser: 1.56 mg/dL — ABNORMAL HIGH (ref 0.44–1.00)
GFR, Estimated: 37 mL/min — ABNORMAL LOW (ref >60)
Glucose, Bld: 89 mg/dL (ref 70–99)
Potassium: 4.2 mmol/L (ref 3.5–5.1)
Sodium: 142 mmol/L (ref 135–145)
Total Bilirubin: 0.7 mg/dL (ref 0.0–1.2)
Total Protein: 5.6 g/dL — ABNORMAL LOW (ref 6.5–8.1)

## 2024-07-01 LAB — CBC
HCT: 30.9 % — ABNORMAL LOW (ref 36.0–46.0)
Hemoglobin: 9.8 g/dL — ABNORMAL LOW (ref 12.0–15.0)
MCH: 30.5 pg (ref 26.0–34.0)
MCHC: 31.7 g/dL (ref 30.0–36.0)
MCV: 96.3 fL (ref 80.0–100.0)
Platelets: 154 K/uL (ref 150–400)
RBC: 3.21 MIL/uL — ABNORMAL LOW (ref 3.87–5.11)
RDW: 15.8 % — ABNORMAL HIGH (ref 11.5–15.5)
WBC: 6 K/uL (ref 4.0–10.5)
nRBC: 0 % (ref 0.0–0.2)

## 2024-07-01 LAB — GLUCOSE, CAPILLARY
Glucose-Capillary: 89 mg/dL (ref 70–99)
Glucose-Capillary: 132 mg/dL — ABNORMAL HIGH (ref 70–99)
Glucose-Capillary: 154 mg/dL — ABNORMAL HIGH (ref 70–99)

## 2024-07-01 LAB — PROTIME-INR
INR: 1.1 (ref 0.8–1.2)
Prothrombin Time: 15.1 s (ref 11.4–15.2)

## 2024-07-01 LAB — CBG MONITORING, ED: Glucose-Capillary: 83 mg/dL (ref 70–99)

## 2024-07-01 MED ORDER — SODIUM CHLORIDE 0.9% FLUSH: 10.0000 mL | INTRAVENOUS | Status: DC | PRN

## 2024-07-01 MED ORDER — CHLORHEXIDINE GLUCONATE CLOTH 2 % EX PADS
6.0000 | MEDICATED_PAD | Freq: Every day | CUTANEOUS | Status: DC
  Administered 2024-07-01 – 2024-07-04 (×4): 6 via TOPICAL

## 2024-07-01 NOTE — Progress Notes (Signed)
 PROGRESS NOTE    Valerie [name]  FMW:[id number] DOB: 1961-05-17 DOA: 06/30/2024 PCP: Arloa Jarvis, NP  Outpatient Specialists:     Brief Narrative:  Patient is a 63 year old female with multiple medical and cardiac problems.  Patient carries diagnosis of bipolar disorder, chronic kidney disease, dementia, type 2 diabetes mellitus, E. 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  Patient was seen by the primary care provider and was diagnosed with UTI, started on Macrobid.  Despite being on antibiotics, symptoms continue to worsen.  Patient was admitted with severe weakness, myalgia and inability to walk.  No fever or chills.  No shortness of breath.  Patient is currently on IV cefepime.  Cultures are pending.    07/01/2024: Patient seen.  Patient continues to report dysuria.  Follow cultures done at this hospital, or obtain culture results from patient's primary care provider's office.   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 -Family concerned about recurrent UTI. - History of colovesical fistula-follow cultures. - Continue IV cefepime.   -Sepsis physiology seems to have resolved. - Continue with cefepime - Continue IV fluids - Trend fever curve and WBC - Follow-up urine culture and blood culture  CT abdomen and pelvis without contrast revealed: 1. New mild right hydroureteronephrosis with ureteral dilatation extending to the pelvic brim, without an identified ureteral obstructing lesion. 2. Mild increase in left ureteral diameter. 3. Mild bladder distention with a small intraluminal gas focus. No fissure identified. Small volume gas potentially related to instrumentation/catheterization    Hypertension - Not currently on antihypertensives   Hyperlipidemia - Continue home rosuvastatin    GERD - Not currently taking PPI   Diabetes - SSI   CKD 3B -Serum creatinine of 1.56 today. - Stable. - Continue to monitor closely.    CAD Chronically occluded right subclavian artery - Continue home ASA, rosuvastatin    Seizure disorder - Continue home Lamictal , Keppra , gabapentin    Bipolar disorder Anxiety Dementia - Continue home Lamictal , trazodone  - Continue donepezil , Namenda  - Replace home desvenlafaxine with formulary venlafaxine   Psoriatic arthritis - Get Enbrel injections outpatient   Parkinson disease - Continue home Sinemet    OSA - Continue CPAP   Neuropathy - Continue home gabapentin    Obesity - Noted    DVT prophylaxis: Subcutaneous Lovenox . Code Status: Full code. Family Communication:  Disposition Plan: Observation.   Consultants:  None  Procedures:  None  Antimicrobials:  IV cefepime.   Subjective: No new complaints. Continues to endorse dysuria.  Objective: Vitals:   07/01/24  9070 07/01/24 0956 07/01/24 1200 07/01/24 1634  BP: (!) 89/53 (!) 105/48 101/66 (!) 145/111  Pulse: 70  (!) 56 62 70  Resp: 13 (!) 21 12 12   Temp: 98.4 F (36.9 C) 98.1 F (36.7 C) 98.3 F (36.8 C) 98.2 F (36.8 C)  TempSrc: Oral Oral Oral Oral  SpO2: 98% 97% 95% 95%  Weight:      Height:        Intake/Output Summary (Last 24 hours) at 07/01/2024 1718 Last data filed at 07/01/2024 0754 Gross per 24 hour  Intake 2101.12 ml  Output 800 ml  Net 1301.12 ml   Filed Weights   06/30/24 1156  Weight: 103 kg    Examination:  General exam: Appears calm and comfortable.  Patient is obese. Respiratory system: Clear to auscultation. Respiratory effort normal. Cardiovascular system: S1 & S2 heard Gastrointestinal system: Abdomen is soft and nontender.  Central nervous system: Alert and oriented.  Extremities: Symmetric 5 x 5 power.   Data Reviewed: I have personally reviewed following labs and imaging studies  CBC: Recent Labs  Lab 06/30/24 1201 07/01/24 0605  WBC 15.0* 6.0  NEUTROABS 13.2*  --   HGB 11.2* 9.8*  HCT 35.4* 30.9*  MCV 96.5 96.3  PLT 180 154   Basic Metabolic Panel: Recent Labs  Lab 06/30/24 1201 07/01/24 0605  NA 138 142  K 4.5 4.2  CL 100 104  CO2 26 29  GLUCOSE 121* 89  BUN 18 15  CREATININE 1.58* 1.56*  CALCIUM  8.5* 8.5*   GFR: Estimated Creatinine Clearance: 41 mL/min (A) (by C-G formula based on SCr of 1.56 mg/dL (H)). Liver Function Tests: Recent Labs  Lab 06/30/24 1201 07/01/24 0605  AST 20 18  ALT 14 5  ALKPHOS 88 67  BILITOT 0.4 0.7  PROT 6.5 5.6*  ALBUMIN  3.3* 2.7*   No results for input(s): LIPASE, AMYLASE in the last 168 hours. No results for input(s): AMMONIA in the last 168 hours. Coagulation Profile: Recent Labs  Lab 07/01/24 0605  INR 1.1   Cardiac Enzymes: Recent Labs  Lab 06/30/24 1205  CKTOTAL 51   BNP (last 3 results) No results for input(s): PROBNP in the last 8760 hours. HbA1C: No results for input(s): HGBA1C in the last 72 hours. CBG: Recent Labs  Lab 06/30/24 1826 06/30/24 2327  07/01/24 0749 07/01/24 1223 07/01/24 1643  GLUCAP 83 89 83 89 132*   Lipid Profile: No results for input(s): CHOL, HDL, LDLCALC, TRIG, CHOLHDL, LDLDIRECT in the last 72 hours. Thyroid  Function Tests: No results for input(s): TSH, T4TOTAL, FREET4, T3FREE, THYROIDAB in the last 72 hours. Anemia Panel: No results for input(s): VITAMINB12, FOLATE, FERRITIN, TIBC, IRON , RETICCTPCT in the last 72 hours. Urine analysis:    Component Value Date/Time   COLORURINE YELLOW 06/30/2024 1201   APPEARANCEUR HAZY (A) 06/30/2024 1201   APPEARANCEUR Clear 01/20/2024 0948   LABSPEC 1.017 06/30/2024 1201   PHURINE 6.0 06/30/2024 1201   GLUCOSEU NEGATIVE 06/30/2024 1201   HGBUR NEGATIVE 06/30/2024 1201   BILIRUBINUR NEGATIVE 06/30/2024 1201   BILIRUBINUR Negative 01/20/2024 0948   KETONESUR NEGATIVE 06/30/2024 1201   PROTEINUR 30 (A) 06/30/2024 1201   NITRITE NEGATIVE 06/30/2024 1201   LEUKOCYTESUR LARGE (A) 06/30/2024 1201   Sepsis Labs: @LABRCNTIP (procalcitonin:4,lacticidven:4)  ) Recent Results (from the past 240 hours)  Culture, blood (x 2)     Status: None (Preliminary result)   Collection Time: 06/30/24  3:20 PM   Specimen: BLOOD RIGHT ARM  Result Value Ref  Range Status   Specimen Description BLOOD RIGHT ARM  Final   Special Requests   Final    BOTTLES DRAWN AEROBIC AND ANAEROBIC Blood Culture adequate volume   Culture   Final    NO GROWTH < 24 HOURS Performed at [facility], [address] [place], [place] [address]    Report Status PENDING  Incomplete         Radiology Studies: CT ABDOMEN PELVIS WO CONTRAST Result Date: 06/30/2024 EXAM: CT ABDOMEN AND PELVIS WITHOUT CONTRAST 06/30/2024 05:44:55 PM TECHNIQUE: CT of the abdomen and pelvis was performed without the administration of intravenous contrast. Multiplanar reformatted images are provided for review. Automated exposure control, iterative reconstruction, and/or weight-based  adjustment of the mA/kV was utilized to reduce the radiation dose to as low as reasonably achievable. COMPARISON: CT chest 10/21/2023, CT abdomen and pelvis 03/15/2024. CLINICAL HISTORY: Vesicointestinal fistula; history of colovesical fistula. Concern for recurrence with recurrent and severe UTI. FINDINGS: LOWER CHEST: Stable lower parenchymal scarring at the inferior right middle lobe. LIVER: The liver is unremarkable. GALLBLADDER AND BILE DUCTS: Post cholecystectomy. No biliary ductal dilatation. SPLEEN: No acute abnormality. PANCREAS: No acute abnormality. ADRENAL GLANDS: No acute abnormality. KIDNEYS, URETERS AND BLADDER: There is new mild hydroureter and hydronephrosis of the right collecting system. No obstructing lesion identified within the right ureter. The ureteral dilatation extends to the pelvic brim. There is mild increase in left ureteral diameter additionally. The bladder is mildly distended. Small bubble of gas within the bladder. No nephrolithiasis. No perinephric or periureteral stranding. GI AND BOWEL: Stomach demonstrates no acute abnormality. There is anastomosis in the sigmoid colon. There is no bowel obstruction. PERITONEUM AND RETROPERITONEUM: No ascites. No free air. VASCULATURE: Aorta is normal in caliber. LYMPH NODES: No lymphadenopathy. REPRODUCTIVE ORGANS: No acute abnormality. BONES AND SOFT TISSUES: Bilateral hip prosthetics. Several surgical clips in the right lower quadrant. No acute osseous abnormality. No focal soft tissue abnormality. IMPRESSION: 1. New mild right hydroureteronephrosis with ureteral dilatation extending to the pelvic brim, without an identified ureteral obstructing lesion. 2. Mild increase in left ureteral diameter. 3. Mild bladder distention with a small intraluminal gas focus. No fissure identified. Small volume gas potentially related to instrumentation/catheterization . Electronically signed by: Norleen Boxer MD 06/30/2024 06:29 PM EST RP Workstation:  [id number]   DG Chest 2 View Result Date: 06/30/2024 EXAM: 2 VIEW(S) XRAY OF THE CHEST 06/30/2024 12:24:00 PM COMPARISON: Chest radiograph 05/29/2024. CLINICAL HISTORY: Hypoxia. FINDINGS: LINES, TUBES AND DEVICES: Right chest port-a-cath tip overlies the mid to lower SVC. LUNGS AND PLEURA: No focal pulmonary opacity. No pulmonary edema. No pleural effusion. No pneumothorax. HEART AND MEDIASTINUM: No acute abnormality of the cardiac silhouette. BONES AND SOFT TISSUES: Degenerative changes of the thoracic spine. No acute osseous abnormality. IMPRESSION: 1. No acute cardiopulmonary findings. Electronically signed by: Shahmeer Marcelino MD 06/30/2024 01:37 PM EST RP Workstation: [id number]        Scheduled Meds:  aspirin  EC  81 mg Oral Daily   carbidopa -levodopa   1 tablet Oral BID   Chlorhexidine  Gluconate Cloth  6 each Topical Daily   donepezil   10 mg Oral Daily   enoxaparin  (LOVENOX ) injection  40 mg Subcutaneous Q24H   gabapentin   300 mg Oral q morning   And   gabapentin   600 mg Oral QHS   insulin  aspart  0-15 Units Subcutaneous TID WC   lamoTRIgine   150 mg Oral BID   levETIRAcetam   750 mg Oral BID   memantine   10 mg Oral  BID   rosuvastatin   10 mg Oral Daily   sodium chloride  flush  3 mL Intravenous Q12H   traZODone   200 mg Oral QHS   venlafaxine XR  75 mg Oral Q breakfast   Continuous Infusions:  ceFEPime (MAXIPIME) IV Stopped (07/01/24 0417)     LOS: 0 days    Time spent: 35 minutes.    Leatrice Chapel, MD  [facility] Pager #: [phone] 7PM-7AM contact night coverage as above

## 2024-07-01 NOTE — Progress Notes (Signed)
 Patient is able to self-administer CPAP using her own equipment. Patient is familiar with equipment and procedure.

## 2024-07-01 NOTE — ED Notes (Addendum)
 Unable to obtain morning labs from PT at this time. Will inform phlebotomy and access port

## 2024-07-01 NOTE — Care Management Obs Status (Signed)
 MEDICARE OBSERVATION STATUS NOTIFICATION   Patient Details  Name: Valerie [name] MRN: [id number] Date of Birth: [birth year]   Medicare Observation Status Notification Given:  Yes    Marval Gell, RN 07/01/2024, 3:24 PM

## 2024-07-02 ENCOUNTER — Inpatient Hospital Stay (HOSPITAL_COMMUNITY)

## 2024-07-02 DIAGNOSIS — L4050 Arthropathic psoriasis, unspecified: Secondary | ICD-10-CM | POA: Diagnosis present

## 2024-07-02 DIAGNOSIS — A4151 Sepsis due to Escherichia coli [E. coli]: Secondary | ICD-10-CM | POA: Diagnosis present

## 2024-07-02 DIAGNOSIS — Z95828 Presence of other vascular implants and grafts: Secondary | ICD-10-CM | POA: Diagnosis not present

## 2024-07-02 DIAGNOSIS — F17210 Nicotine dependence, cigarettes, uncomplicated: Secondary | ICD-10-CM | POA: Diagnosis present

## 2024-07-02 DIAGNOSIS — G40909 Epilepsy, unspecified, not intractable, without status epilepticus: Secondary | ICD-10-CM | POA: Diagnosis present

## 2024-07-02 DIAGNOSIS — I2510 Atherosclerotic heart disease of native coronary artery without angina pectoris: Secondary | ICD-10-CM | POA: Diagnosis present

## 2024-07-02 DIAGNOSIS — F0284 Dementia in other diseases classified elsewhere, unspecified severity, with anxiety: Secondary | ICD-10-CM | POA: Diagnosis present

## 2024-07-02 DIAGNOSIS — G309 Alzheimer's disease, unspecified: Secondary | ICD-10-CM | POA: Diagnosis present

## 2024-07-02 DIAGNOSIS — A419 Sepsis, unspecified organism: Secondary | ICD-10-CM | POA: Diagnosis not present

## 2024-07-02 DIAGNOSIS — N321 Vesicointestinal fistula: Secondary | ICD-10-CM | POA: Diagnosis present

## 2024-07-02 DIAGNOSIS — Z6841 Body Mass Index (BMI) 40.0 and over, adult: Secondary | ICD-10-CM | POA: Diagnosis not present

## 2024-07-02 DIAGNOSIS — N390 Urinary tract infection, site not specified: Secondary | ICD-10-CM | POA: Diagnosis not present

## 2024-07-02 DIAGNOSIS — E1122 Type 2 diabetes mellitus with diabetic chronic kidney disease: Secondary | ICD-10-CM | POA: Diagnosis present

## 2024-07-02 DIAGNOSIS — D631 Anemia in chronic kidney disease: Secondary | ICD-10-CM | POA: Diagnosis present

## 2024-07-02 DIAGNOSIS — N136 Pyonephrosis: Secondary | ICD-10-CM | POA: Diagnosis present

## 2024-07-02 DIAGNOSIS — I5033 Acute on chronic diastolic (congestive) heart failure: Secondary | ICD-10-CM | POA: Diagnosis present

## 2024-07-02 DIAGNOSIS — G20A1 Parkinson's disease without dyskinesia, without mention of fluctuations: Secondary | ICD-10-CM | POA: Diagnosis present

## 2024-07-02 DIAGNOSIS — F0283 Dementia in other diseases classified elsewhere, unspecified severity, with mood disturbance: Secondary | ICD-10-CM | POA: Diagnosis present

## 2024-07-02 DIAGNOSIS — E1169 Type 2 diabetes mellitus with other specified complication: Secondary | ICD-10-CM | POA: Diagnosis present

## 2024-07-02 DIAGNOSIS — N1832 Chronic kidney disease, stage 3b: Secondary | ICD-10-CM | POA: Diagnosis present

## 2024-07-02 DIAGNOSIS — E66812 Obesity, class 2: Secondary | ICD-10-CM | POA: Diagnosis present

## 2024-07-02 DIAGNOSIS — K219 Gastro-esophageal reflux disease without esophagitis: Secondary | ICD-10-CM | POA: Diagnosis present

## 2024-07-02 DIAGNOSIS — R531 Weakness: Secondary | ICD-10-CM | POA: Diagnosis present

## 2024-07-02 DIAGNOSIS — G4733 Obstructive sleep apnea (adult) (pediatric): Secondary | ICD-10-CM | POA: Diagnosis present

## 2024-07-02 DIAGNOSIS — D849 Immunodeficiency, unspecified: Secondary | ICD-10-CM | POA: Diagnosis present

## 2024-07-02 DIAGNOSIS — I129 Hypertensive chronic kidney disease with stage 1 through stage 4 chronic kidney disease, or unspecified chronic kidney disease: Secondary | ICD-10-CM | POA: Diagnosis present

## 2024-07-02 DIAGNOSIS — E1140 Type 2 diabetes mellitus with diabetic neuropathy, unspecified: Secondary | ICD-10-CM | POA: Diagnosis present

## 2024-07-02 LAB — GLUCOSE, CAPILLARY
Glucose-Capillary: 134 mg/dL — ABNORMAL HIGH (ref 70–99)
Glucose-Capillary: 85 mg/dL (ref 70–99)
Glucose-Capillary: 112 mg/dL — ABNORMAL HIGH (ref 70–99)
Glucose-Capillary: 90 mg/dL (ref 70–99)

## 2024-07-02 LAB — CBC WITH DIFFERENTIAL/PLATELET
Abs Immature Granulocytes: 0.01 K/uL (ref 0.00–0.07)
Basophils Absolute: 0 K/uL (ref 0.0–0.1)
Basophils Relative: 1 %
Eosinophils Absolute: 0.4 K/uL (ref 0.0–0.5)
Eosinophils Relative: 9 %
HCT: 29.5 % — ABNORMAL LOW (ref 36.0–46.0)
Hemoglobin: 9.3 g/dL — ABNORMAL LOW (ref 12.0–15.0)
Immature Granulocytes: 0 %
Lymphocytes Relative: 18 %
Lymphs Abs: 0.9 K/uL (ref 0.7–4.0)
MCH: 30.5 pg (ref 26.0–34.0)
MCHC: 31.5 g/dL (ref 30.0–36.0)
MCV: 96.7 fL (ref 80.0–100.0)
Monocytes Absolute: 0.5 K/uL (ref 0.1–1.0)
Monocytes Relative: 10 %
Neutro Abs: 3 K/uL (ref 1.7–7.7)
Neutrophils Relative %: 62 %
Platelets: 152 K/uL (ref 150–400)
RBC: 3.05 MIL/uL — ABNORMAL LOW (ref 3.87–5.11)
RDW: 15.9 % — ABNORMAL HIGH (ref 11.5–15.5)
WBC: 4.9 K/uL (ref 4.0–10.5)
nRBC: 0 % (ref 0.0–0.2)

## 2024-07-02 LAB — COMPREHENSIVE METABOLIC PANEL WITH GFR
ALT: <5 U/L (ref 0–44)
AST: 16 U/L (ref 15–41)
Albumin: 2.4 g/dL — ABNORMAL LOW (ref 3.5–5.0)
Alkaline Phosphatase: 61 U/L (ref 38–126)
Anion gap: 11 (ref 5–15)
BUN: 15 mg/dL (ref 8–23)
CO2: 26 mmol/L (ref 22–32)
Calcium: 8.3 mg/dL — ABNORMAL LOW (ref 8.9–10.3)
Chloride: 103 mmol/L (ref 98–111)
Creatinine, Ser: 1.5 mg/dL — ABNORMAL HIGH (ref 0.44–1.00)
GFR, Estimated: 39 mL/min — ABNORMAL LOW (ref >60)
Glucose, Bld: 99 mg/dL (ref 70–99)
Potassium: 4.1 mmol/L (ref 3.5–5.1)
Sodium: 140 mmol/L (ref 135–145)
Total Bilirubin: 0.4 mg/dL (ref 0.0–1.2)
Total Protein: 5.1 g/dL — ABNORMAL LOW (ref 6.5–8.1)

## 2024-07-02 LAB — PROCALCITONIN: Procalcitonin: <0.1 ng/mL

## 2024-07-02 LAB — C-REACTIVE PROTEIN: CRP: 5.3 mg/dL — ABNORMAL HIGH (ref <1.0)

## 2024-07-02 LAB — MAGNESIUM: Magnesium: 1.8 mg/dL (ref 1.7–2.4)

## 2024-07-02 MED ORDER — PHENAZOPYRIDINE HCL 100 MG PO TABS
100.0000 mg | ORAL_TABLET | Freq: Three times a day (TID) | ORAL | Status: DC
  Administered 2024-07-03 – 2024-07-04 (×5): 100 mg via ORAL
  Filled 2024-07-02 (×6): qty 1

## 2024-07-02 MED ORDER — BACLOFEN 5 MG HALF TABLET
5.0000 mg | ORAL_TABLET | Freq: Three times a day (TID) | ORAL | Status: DC | PRN
  Administered 2024-07-02: 5 mg via ORAL
  Filled 2024-07-02: qty 1

## 2024-07-02 MED ORDER — MIDODRINE HCL 5 MG PO TABS
10.0000 mg | ORAL_TABLET | Freq: Three times a day (TID) | ORAL | Status: DC
  Administered 2024-07-02 – 2024-07-04 (×7): 10 mg via ORAL
  Filled 2024-07-02 (×9): qty 2

## 2024-07-02 MED ORDER — LACTATED RINGERS IV SOLN: INTRAVENOUS | Status: DC

## 2024-07-02 MED ORDER — SODIUM CHLORIDE 0.9 % IV BOLUS
250.0000 mL | Freq: Once | INTRAVENOUS | Status: AC
  Administered 2024-07-02: 250 mL via INTRAVENOUS

## 2024-07-02 MED ORDER — ACETAMINOPHEN 650 MG RE SUPP: 650.0000 mg | Freq: Four times a day (QID) | RECTAL | Status: DC | PRN

## 2024-07-02 MED ORDER — SODIUM CHLORIDE 0.9 % IV BOLUS
500.0000 mL | Freq: Once | INTRAVENOUS | Status: AC
  Administered 2024-07-02: 500 mL via INTRAVENOUS

## 2024-07-02 MED ORDER — LACTATED RINGERS IV BOLUS
500.0000 mL | Freq: Once | INTRAVENOUS | Status: AC
  Administered 2024-07-02: 500 mL via INTRAVENOUS

## 2024-07-02 MED ORDER — ACETAMINOPHEN 325 MG PO TABS: 650.0000 mg | ORAL_TABLET | Freq: Four times a day (QID) | ORAL | Status: DC | PRN

## 2024-07-02 NOTE — Consult Note (Addendum)
 H&P Physician requesting consult: Lavada Stank  Chief Complaint: Recurrent UTI, history of colovesical fistula, hydronephrosis  History of Present Illness:  #1. Colovesicular fistula:  -In 2021, she had diverticulitis with concern for perforation as well as colovesicular fistula identified on CT 04/2020.  Flexible sigmoidoscopy on 05/17/2020 with benign-appearing, intrinsic moderate stenosis in the sigmoid colon with no evidence of any malignancy and evidence of diverticulosis in the sigmoid colon.   She is s/p robotic low anterior rectosigmoid resection with primary colovesical fistula repair, omental pedicle flap of bladder repair with Dr. Elspeth Schultze on 07/17/2020. CT cystogram 08/15/2020 with persistent colovesicular fistula arising from the dome of the bladder extending into the sigmoid colon along with a 8 x 4 x 4 cm abscess adjacent to the bladder and sigmoid colon. S/p IR drain 08/16/2020 into abscess. Drain study on 09/10/2020 with persistent colovesicular fistula from the sigmoid colon into the bladder. CT did demonstrate resolution of drain left pelvic abscess. She had been undergoing sinograms with interventional radiology. On 10/24/2020, there was evidence of persistent vesicular fistula. This drain was downsized to a 10 French. She then had follow-up on 10/31/2020 and per note, the catheter had substantially been retracted with drain holes external to skin entry stie, so the catether was removed. She had a catheter removed in 10/2020.   She presents in July 2025 with complaints of recurrent urinary tract infections. She states she has had a UTI in January, March, and June 2025. Of note, she was hospitalized in March 25 with sepsis from UTI source with E. coli ESBL. She states she was seen in June by her PCP and was found to have UTI was placed on nitrofurantoin. Initial urine culture was E. coli and subsequent urine culture was 10-50 Enterococcus faecalis. She denies pneumaturia or feces in her  urine. She denies dysuria. She denies fevers, chills.  She underwent CT of the abdomen pelvis with oral contrast in July of this year that showed a loop of sigmoid colon in close approximation with the dome of the bladder near the suture line with some tethering.  There was no contrast within the sigmoid colon and no free air in the bladder.  Consideration for cystogram was recommended if clinical concern persisted.  Patient presented to the hospital with 2 to 3-day history of worsening dysuria and hematuria.  She had been placed on Macrobid for possible UTI but symptoms worsened.  She also developed some myalgia and difficulty walking.  She denied fever, chill.  CT of the abdomen and pelvis was performed that showed mild bladder distention with a small intraluminal gas focus.  There is mild increase in the left ureteral diameter and new mild right hydronephrosis with ureteral dilation down to the pelvic brim without obvious stone or lesion.  Creatinine is 1.5 and she has no leukocytosis.  Urinalysis with large leukocyte, negative nitrite, no bacteria, greater than 50 WBC, 0-5 RBC.  Urine culture with 30,000 E. coli, sensitivities pending.  Past Medical History:  Diagnosis Date   Anemia    Anxiety    Arthritis    Psoriatic   Bipolar disorder (HCC)    CKD (chronic kidney disease), stage III (HCC)    Colovesical fistula    Dementia (HCC)    Depression    DM type 2 (diabetes mellitus, type 2) (HCC) 11/14/2019   Dyspnea    E coli bacteremia 07/28/2023   Headache    Hyperthyroidism    Neuromuscular disorder (HCC)    Neuropathy  NSTEMI (non-ST elevated myocardial infarction) (HCC) 11/14/2019   OSA (obstructive sleep apnea)    Primary localized osteoarthritis of right knee 10/19/2023   Psoriatic arthritis (HCC)    Pyelonephritis 07/23/2023   Sleep apnea    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   Stroke (HCC)    ?history; MRI normal   Past Surgical History:  Procedure Laterality  Date   ABDOMINAL HYSTERECTOMY     APPENDECTOMY     CHOLECYSTECTOMY     CYSTOSCOPY WITH STENT PLACEMENT N/A 07/17/2020   Procedure: CYSTOSCOPY WITH BILATERAL FIREFLY INJECTION;  Surgeon: Renda Glance, MD;  Location: [facility];  Service: Urology;  Laterality: N/A;   FLEXIBLE SIGMOIDOSCOPY N/A 05/17/2020   Procedure: FLEXIBLE SIGMOIDOSCOPY;  Surgeon: Rollin Dover, MD;  Location: [facility];  Service: Endoscopy;  Laterality: N/A;   FOOT SURGERY Right    IR FLUORO GUIDE CV LINE RIGHT  08/19/2020   IR IMAGING GUIDED PORT INSERTION  02/04/2024   IR RADIOLOGIST EVAL & MGMT  09/10/2020   IR RADIOLOGIST EVAL & MGMT  10/10/2020   IR REMOVAL TUN CV CATH W/O FL  09/16/2020   IR SINUS/FIST TUBE CHK-NON GI  10/24/2020   IR SINUS/FIST TUBE CHK-NON GI  10/31/2020   IR US  GUIDE VASC ACCESS RIGHT  08/19/2020   LEFT HEART CATH AND CORONARY ANGIOGRAPHY N/A 11/14/2019   Procedure: LEFT HEART CATH AND CORONARY ANGIOGRAPHY;  Surgeon: Jordan, Peter M, MD;  Location: [facility];  Service: Cardiovascular;  Laterality: N/A;   ORIF PERIPROSTHETIC FRACTURE Right 01/03/2024   Procedure: OPEN REDUCTION INTERNAL FIXATION (ORIF) PERIPROSTHETIC FRACTURE;  Surgeon: Kendal Franky SQUIBB, MD;  Location: [facility];  Service: Orthopedics;  Laterality: Right;   PARATHYROID  EXPLORATION N/A 01/09/2021   Procedure: POSSIBLE NECK EXPLORATION;  Surgeon: Eletha Boas, MD;  Location: [facility];  Service: General;  Laterality: N/A;   PARATHYROIDECTOMY Left 01/09/2021   Procedure: LEFT INFERIOR PARATHYROIDECTOMY;  Surgeon: Eletha Boas, MD;  Location: [facility];  Service: General;  Laterality: Left;   PROCTOSCOPY N/A 07/17/2020   Procedure: RIGID PROCTOSCOPY;  Surgeon: Sheldon Standing, MD;  Location: [facility];  Service: General;  Laterality: N/A;   TONSILLECTOMY     TOTAL HIP ARTHROPLASTY Bilateral    TOTAL KNEE ARTHROPLASTY Right 10/19/2023   Procedure: TOTAL KNEE ARTHROPLASTY;  Surgeon: Josefina Chew, MD;  Location: [facility];  Service: Orthopedics;   Laterality: Right;    Home Medications:  Medications Prior to Admission  Medication Sig Dispense Refill Last Dose/Taking   aspirin  EC 81 MG tablet Take 81 mg by mouth daily. Swallow whole.   06/29/2024   carbidopa -levodopa  (SINEMET  IR) 25-100 MG tablet Take 1 tablet by mouth in the morning and at bedtime.   06/29/2024   desvenlafaxine (PRISTIQ) 25 MG 24 hr tablet Take 25 mg by mouth daily. (Patient taking differently: Take 50 mg by mouth daily.)   06/29/2024   Docusate Calcium  (STOOL SOFTENER PO) Take 1 tablet by mouth in the morning and at bedtime.   06/29/2024   donepezil  (ARICEPT ) 10 MG tablet Take 10 mg by mouth daily.   06/29/2024   ENBREL 50 MG/ML injection Inject 50 mg into the skin once a week. Patient takes this medication on Mondays   06/26/2024   fluconazole  (DIFLUCAN ) 150 MG tablet Take 150 mg by mouth as needed (Recurring UTI).   Past Week   gabapentin  (NEURONTIN ) 300 MG capsule Take 300-600 mg by mouth See admin instructions. Take 300 mg in the morning and 600 mg at  night   06/29/2024   lamoTRIgine  (LAMICTAL ) 150 MG tablet Take 1 tablet by mouth 2 (two) times daily.   06/29/2024   Lecanemab-irmb (LEQEMBI IV) Inject into the vein every 14 (fourteen) days.   06/27/2024   levETIRAcetam  (KEPPRA ) 750 MG tablet Take 750 mg by mouth 2 (two) times daily.   06/29/2024   levocetirizine (XYZAL ) 5 MG tablet Take 1 tablet (5 mg total) by mouth every evening. (Patient taking differently: Take 5 mg by mouth as needed for allergies.) 90 tablet 3 Past Week   meclizine  (ANTIVERT ) 25 MG tablet Take 25 mg by mouth 3 (three) times daily as needed for dizziness.   Past Week   memantine  (NAMENDA ) 10 MG tablet Take 10 mg by mouth 2 (two) times daily.   06/29/2024   Multiple Vitamins-Minerals (MULTIVITAMIN WITH MINERALS) tablet Take 1 tablet by mouth daily.   06/29/2024   nitrofurantoin, macrocrystal-monohydrate, (MACROBID) 100 MG capsule Take 100 mg by mouth 2 (two) times daily.   06/29/2024   Omega-3 Fatty Acids  (FISH OIL PO) Take 2 capsules by mouth daily.   06/29/2024   omeprazole  (PRILOSEC) 40 MG capsule Take 1 capsule (40 mg total) by mouth daily. 90 capsule 3 06/29/2024   ondansetron  (ZOFRAN -ODT) 8 MG disintegrating tablet Take 8 mg by mouth every 8 (eight) hours as needed for nausea or vomiting.   Past Month   oxybutynin  (DITROPAN ) 5 MG tablet Take 5 mg by mouth 2 (two) times daily.   06/29/2024   polyethylene glycol (MIRALAX  / GLYCOLAX ) 17 g packet Take 17 g by mouth 2 (two) times daily. (Patient taking differently: Take 17 g by mouth daily as needed for mild constipation.)   Past Week   predniSONE  (DELTASONE ) 5 MG tablet Take 5 mg by mouth daily.   06/29/2024   primidone  (MYSOLINE ) 50 MG tablet Take 100 mg by mouth 2 (two) times daily.   06/29/2024   rizatriptan  (MAXALT ) 10 MG tablet Take 10 mg by mouth as needed for migraine. May repeat in 2 hours if needed   Past Month   rosuvastatin  (CRESTOR ) 10 MG tablet Take 1 tablet (10 mg total) by mouth daily. (Patient taking differently: Take 10 mg by mouth at bedtime.) 90 tablet 3 06/29/2024   tiZANidine  (ZANAFLEX ) 4 MG tablet Take 1 tablet (4 mg total) by mouth 3 (three) times daily. (Patient taking differently: Take 4 mg by mouth as needed for muscle spasms.) 60 tablet 0 06/29/2024   traZODone  (DESYREL ) 100 MG tablet Take 2 tablets (200 mg total) by mouth at bedtime. 60 tablet 0 06/29/2024   nitroGLYCERIN  (NITROSTAT ) 0.4 MG SL tablet Place 1 tablet (0.4 mg total) under the tongue every 5 (five) minutes as needed for chest pain. (Patient not taking: Reported on 07/01/2024) 10 tablet 2 Not Taking   sennosides-docusate sodium  (SENOKOT-S) 8.6-50 MG tablet Take 2 tablets by mouth daily. (Patient not taking: Reported on 07/01/2024) 30 tablet 1 Not Taking   ZEPBOUND 2.5 MG/0.5ML Pen Inject 2.5 mg into the skin once a week. (Patient not taking: Reported on 07/01/2024)   Not Taking   Allergies:  Allergies  Allergen Reactions   Carbamazepine Other (See Comments)     Parkinsons like symptoms, tremors   Sertraline Hcl Other (See Comments)    Parkinsons like symptoms    Family History  Problem Relation Age of Onset   Breast cancer Neg Hx    Social History:  reports that she has quit smoking. Her smoking use included cigarettes and e-cigarettes.  She has been exposed to tobacco smoke. She has never used smokeless tobacco. She reports current alcohol use. She reports that she does not use drugs.  ROS: A complete review of systems was performed.  All systems are negative except for pertinent findings as noted. ROS   Physical Exam:  Vital signs in last 24 hours: Temp:  [97.6 F (36.4 C)-98.4 F (36.9 C)] 98.4 F (36.9 C) (11/09 1500) Pulse Rate:  [56-95] 95 (11/09 1500) Resp:  [12-24] 19 (11/09 1500) BP: (73-145)/(40-111) 128/71 (11/09 1500) SpO2:  [87 %-96 %] 93 % (11/09 1500) General:  Alert and oriented, No acute distress HEENT: Normocephalic, atraumatic Neck: No JVD or lymphadenopathy Cardiovascular: Regular rate and rhythm Lungs: Regular rate and effort Abdomen: Soft, nontender, nondistended, no abdominal masses Back: No CVA tenderness Extremities: No edema Neurologic: Grossly intact  Laboratory Data:  Results for orders placed or performed during the hospital encounter of 06/30/24 (from the past 24 hours)  Glucose, capillary     Status: Abnormal   Collection Time: 07/01/24  4:43 PM  Result Value Ref Range   Glucose-Capillary 132 (H) 70 - 99 mg/dL  Glucose, capillary     Status: Abnormal   Collection Time: 07/01/24  8:32 PM  Result Value Ref Range   Glucose-Capillary 154 (H) 70 - 99 mg/dL   Comment 1 Notify RN    Comment 2 Document in Chart   CBC with Differential/Platelet     Status: Abnormal   Collection Time: 07/02/24  7:13 AM  Result Value Ref Range   WBC 4.9 4.0 - 10.5 K/uL   RBC 3.05 (L) 3.87 - 5.11 MIL/uL   Hemoglobin 9.3 (L) 12.0 - 15.0 g/dL   HCT 70.4 (L) 63.9 - 53.9 %   MCV 96.7 80.0 - 100.0 fL   MCH 30.5 26.0 -  34.0 pg   MCHC 31.5 30.0 - 36.0 g/dL   RDW 84.0 (H) 88.4 - 84.4 %   Platelets 152 150 - 400 K/uL   nRBC 0.0 0.0 - 0.2 %   Neutrophils Relative % 62 %   Neutro Abs 3.0 1.7 - 7.7 K/uL   Lymphocytes Relative 18 %   Lymphs Abs 0.9 0.7 - 4.0 K/uL   Monocytes Relative 10 %   Monocytes Absolute 0.5 0.1 - 1.0 K/uL   Eosinophils Relative 9 %   Eosinophils Absolute 0.4 0.0 - 0.5 K/uL   Basophils Relative 1 %   Basophils Absolute 0.0 0.0 - 0.1 K/uL   Immature Granulocytes 0 %   Abs Immature Granulocytes 0.01 0.00 - 0.07 K/uL  Comprehensive metabolic panel with GFR     Status: Abnormal   Collection Time: 07/02/24  7:13 AM  Result Value Ref Range   Sodium 140 135 - 145 mmol/L   Potassium 4.1 3.5 - 5.1 mmol/L   Chloride 103 98 - 111 mmol/L   CO2 26 22 - 32 mmol/L   Glucose, Bld 99 70 - 99 mg/dL   BUN 15 8 - 23 mg/dL   Creatinine, Ser 8.49 (H) 0.44 - 1.00 mg/dL   Calcium  8.3 (L) 8.9 - 10.3 mg/dL   Total Protein 5.1 (L) 6.5 - 8.1 g/dL   Albumin  2.4 (L) 3.5 - 5.0 g/dL   AST 16 15 - 41 U/L   ALT <5 0 - 44 U/L   Alkaline Phosphatase 61 38 - 126 U/L   Total Bilirubin 0.4 0.0 - 1.2 mg/dL   GFR, Estimated 39 (L) >60 mL/min   Anion gap 11  5 - 15  C-reactive protein     Status: Abnormal   Collection Time: 07/02/24  7:13 AM  Result Value Ref Range   CRP 5.3 (H) <1.0 mg/dL  Magnesium      Status: None   Collection Time: 07/02/24  7:13 AM  Result Value Ref Range   Magnesium  1.8 1.7 - 2.4 mg/dL  Procalcitonin     Status: None   Collection Time: 07/02/24  7:13 AM  Result Value Ref Range   Procalcitonin <0.10 ng/mL  Glucose, capillary     Status: Abnormal   Collection Time: 07/02/24  8:31 AM  Result Value Ref Range   Glucose-Capillary 134 (H) 70 - 99 mg/dL   Recent Results (from the past 240 hours)  Urine Culture     Status: Abnormal (Preliminary result)   Collection Time: 06/30/24 12:01 PM   Specimen: Urine, Random  Result Value Ref Range Status   Specimen Description URINE, RANDOM  Final    Special Requests NONE Reflexed from [id number]  Final   Culture (A)  Final    30,000 COLONIES/mL ESCHERICHIA COLI SUSCEPTIBILITIES TO FOLLOW Performed at [facility], [address], [place], [place] [address]    Report Status PENDING  Incomplete  Culture, blood (x 2)     Status: None (Preliminary result)   Collection Time: 06/30/24  3:20 PM   Specimen: BLOOD RIGHT ARM  Result Value Ref Range Status   Specimen Description BLOOD RIGHT ARM  Final   Special Requests   Final    BOTTLES DRAWN AEROBIC AND ANAEROBIC Blood Culture adequate volume   Culture   Final    NO GROWTH 2 DAYS Performed at [facility], [address] [place], [place] [address]    Report Status PENDING  Incomplete   Creatinine: Recent Labs    06/30/24 1201 07/01/24 0605 07/02/24 0713  CREATININE 1.58* 1.56* 1.50*   CT scan personally reviewed and is detailed in the history of present illness  Impression/Assessment:  Recurrent urinary tract infections Right hydronephrosis with incomplete bladder emptying/retention Acute urinary tract infection  Plan:  Agree with antibiotics until speciation occurs at which point hopefully discharged with an oral antibiotic.  CT cystogram has been ordered to evaluate for possible recurrent fistula.  She also needs to follow-up outpatient for cystoscopy to evaluate the lower urinary tract.  Also discussed the use of vaginal estrogen in postmenopausal patients to help prevent recurrent UTI.  I would recommend initiation of that outpatient.  Also discussed low-dose nightly UTI prophylaxis which we can start depending on susceptibilities after completion of full dose antibiotic course.  I recommended Foley catheter placement, placed that order given her incomplete emptying and mild hydronephrosis.  Plan for voiding trial in 2 to 3 days after treatment with the antibiotic.  Sherwood JONETTA Edison, III 07/02/2024, 3:45 PM

## 2024-07-02 NOTE — Progress Notes (Signed)
 PROGRESS NOTE                                                                                                                                                                                                             Patient Demographics:    Valerie [name], is a 63 y.o. female, DOB - 1961-03-15, FMW:[id number]  Outpatient Primary MD for the patient is Arloa Jarvis, NP    LOS - 0  Admit date - 06/30/2024    Chief Complaint  Patient presents with   UTI       Brief Narrative (HPI from H&P)    63 y.o. 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   Patient reports history of recurrent UTI.  Also recently admitted 10/6-10/13 with sepsis secondary to UTI that was treated with 7 days of ceftriaxone .  Also treated for AKI which improved with fluids.  She went home now comes back with dysuria and low-grade fevers diagnosed with UTI, CT showing some right sided hydroureteronephrosis with ureteral dilation and was admitted to the hospital.   Subjective:    [name] today has, No headache, No chest pain, No abdominal pain - No Nausea, No new weakness tingling or numbness, shortness of breath, still having some dysuria.   Assessment  & Plan :   Sepsis secondary to UTI - this is the second such episode in the last few weeks, failed outpatient Macrobid treatment, history of colovesical fistula which was repaired by general surgeon Dr. Sheldon and she is also followed by urologist Dr. Selma.  He is being treated with IV fluids, empiric IV antibiotics, inflammatory markers being followed, clinically doing better. Both urology and  general surgery informed.  CT shows some right sided hydroureteronephrosis with ureteral dilatation extending to the pelvic brim, will defer this to general surgery and urology.  Follow cultures.  Hypotension.  IV fluid bolus and hydrate.  Monitor.   Hyperlipidemia  - Continue home rosuvastatin    GERD - Not currently taking PPI   CKD 3B  -Serum creatinine of 1.56 today. Stable. Continue to monitor closely.     CAD, Chronically occluded right subclavian  artery  - Continue home ASA, rosuvastatin    Seizure disorder - Continue home Lamictal , Keppra , gabapentin    Bipolar disorder, Anxiety, Dementia -  Continue home Lamictal , trazodone . Continue donepezil , Namenda . Replace home desvenlafaxine with formulary venlafaxine   Psoriatic arthritis  - Get Enbrel injections outpatient   Parkinson disease  - Continue home Sinemet    OSA - Continue CPAP   Neuropathy - Continue home gabapentin    Obesity BMI of 41, follow-up with PCP.   DM 2 - SSI  Lab Results  Component Value Date   HGBA1C 5.3 10/21/2023   CBG (last 3)  Recent Labs    07/01/24 1223 07/01/24 1643 07/01/24 2032  GLUCAP 89 132* 154*        Condition - Fair  Family Communication  : Called daughter Geofm [phone]  on 07/02/2024 at 7:45 AM and detailed message left  Code Status :  Full  Consults  : Neurology, general surgery  PUD Prophylaxis :    Procedures  :     CT - 1. New mild right hydroureteronephrosis with ureteral dilatation extending to the pelvic brim, without an identified ureteral obstructing lesion. 2. Mild increase in left ureteral diameter. 3. Mild bladder distention with a small intraluminal gas focus. No fissure identified. Small volume gas potentially related to instrumentation/catheterization .      Disposition Plan  :    Status is: Inpatient   DVT Prophylaxis  :    enoxaparin  (LOVENOX ) injection 40 mg Start: 06/30/24 1800    Lab Results  Component Value Date   PLT 152  07/02/2024    Diet :  Diet Order             Diet regular Room service appropriate? Yes; Fluid consistency: Thin  Diet effective now                    Inpatient Medications  Scheduled Meds:  aspirin  EC  81 mg Oral Daily   carbidopa -levodopa   1 tablet Oral BID   Chlorhexidine  Gluconate Cloth  6 each Topical Daily   donepezil   10 mg Oral Daily   enoxaparin  (LOVENOX ) injection  40 mg Subcutaneous Q24H   gabapentin   300 mg Oral q morning   And   gabapentin   600 mg Oral QHS   insulin  aspart  0-15 Units Subcutaneous TID WC   lamoTRIgine   150 mg Oral BID   levETIRAcetam   750 mg Oral BID   memantine   10 mg Oral BID   midodrine   10 mg Oral TID WC   rosuvastatin   10 mg Oral Daily   sodium chloride  flush  3 mL Intravenous Q12H   traZODone   200 mg Oral QHS   venlafaxine XR  75 mg Oral Q breakfast   Continuous Infusions:  ceFEPime (MAXIPIME) IV 2 g (07/02/24 0231)   PRN Meds:.acetaminophen  **OR** acetaminophen , polyethylene glycol, sodium chloride  flush, traMADol   Antibiotics  :    Anti-infectives (From admission, onward)    Start     Dose/Rate Route Frequency Ordered Stop   07/01/24 0300  ceFEPIme (MAXIPIME) 2 g in sodium chloride  0.9 % 100 mL IVPB        2 g 200 mL/hr over 30 Minutes Intravenous Every 12 hours 06/30/24 1616     06/30/24 1430  ceFEPIme (MAXIPIME) 2 g in sodium chloride  0.9 % 100 mL IVPB        2 g 200 mL/hr over 30 Minutes Intravenous  Once 06/30/24 1427 06/30/24 1811  Objective:   Vitals:   07/02/24 0437 07/02/24 0500 07/02/24 0604 07/02/24 0645  BP: (!) 95/55 (!) 100/53 (!) 89/51 (!) 94/55  Pulse: (!) 56  (!) 56 60  Resp: 19  16 12   Temp:      TempSrc:      SpO2: 90%  91% 96%  Weight:      Height:        Wt Readings from Last 3 Encounters:  06/30/24 103 kg  05/30/24 103.1 kg  04/18/24 95.3 kg     Intake/Output Summary (Last 24 hours) at 07/02/2024 0743 Last data filed at 07/01/2024 1956 Gross per 24 hour  Intake --   Output 1600 ml  Net -1600 ml     Physical Exam  Awake Alert, No new F.N deficits, Normal affect [place].AT,PERRAL Supple Neck, No JVD,   Symmetrical Chest wall movement, Good air movement bilaterally, CTAB RRR,No Gallops,Rubs or new Murmurs,  +ve B.Sounds, Abd Soft, No tenderness,   No Cyanosis, Clubbing or edema        Data Review:    Recent Labs  Lab 06/30/24 1201 07/01/24 0605 07/02/24 0713  WBC 15.0* 6.0 4.9  HGB 11.2* 9.8* 9.3*  HCT 35.4* 30.9* 29.5*  PLT 180 154 152  MCV 96.5 96.3 96.7  MCH 30.5 30.5 30.5  MCHC 31.6 31.7 31.5  RDW 15.9* 15.8* 15.9*  LYMPHSABS 0.6*  --  0.9  MONOABS 0.5  --  0.5  EOSABS 0.5  --  0.4  BASOSABS 0.1  --  0.0    Recent Labs  Lab 06/30/24 1201 06/30/24 1217 06/30/24 1602 07/01/24 0605  NA 138  --   --  142  K 4.5  --   --  4.2  CL 100  --   --  104  CO2 26  --   --  29  ANIONGAP 12  --   --  9  GLUCOSE 121*  --   --  89  BUN 18  --   --  15  CREATININE 1.58*  --   --  1.56*  AST 20  --   --  18  ALT 14  --   --  5  ALKPHOS 88  --   --  67  BILITOT 0.4  --   --  0.7  ALBUMIN  3.3*  --   --  2.7*  LATICACIDVEN  --  1.3 1.6  --   INR  --   --   --  1.1  CALCIUM  8.5*  --   --  8.5*      Recent Labs  Lab 06/30/24 1201 06/30/24 1217 06/30/24 1602 07/01/24 0605  LATICACIDVEN  --  1.3 1.6  --   INR  --   --   --  1.1  CALCIUM  8.5*  --   --  8.5*    --------------------------------------------------------------------------------------------------------------- Lab Results  Component Value Date   CHOL 156 02/10/2023   HDL 48.30 02/10/2023   LDLCALC 75 02/10/2023   TRIG 162.0 (H) 02/10/2023   CHOLHDL 3 02/10/2023    Lab Results  Component Value Date   HGBA1C 5.3 10/21/2023   No results for input(s): TSH, T4TOTAL, FREET4, T3FREE, THYROIDAB in the last 72 hours. No results for input(s): VITAMINB12, FOLATE, FERRITIN, TIBC, IRON , RETICCTPCT in the last 72  hours. ------------------------------------------------------------------------------------------------------------------ Cardiac Enzymes No results for input(s): CKMB, TROPONINI, MYOGLOBIN in the last 168 hours.  Invalid input(s): CK  Micro Results Recent Results (from the past 240 hours)  Culture, blood (x 2)     Status: None (Preliminary result)   Collection Time: 06/30/24  3:20 PM   Specimen: BLOOD RIGHT ARM  Result Value Ref Range Status   Specimen Description BLOOD RIGHT ARM  Final   Special Requests   Final    BOTTLES DRAWN AEROBIC AND ANAEROBIC Blood Culture adequate volume   Culture   Final    NO GROWTH < 24 HOURS Performed at [facility], [address] [place], [place] [address]    Report Status PENDING  Incomplete    Radiology Report CT ABDOMEN PELVIS WO CONTRAST Result Date: 06/30/2024 EXAM: CT ABDOMEN AND PELVIS WITHOUT CONTRAST 06/30/2024 05:44:55 PM TECHNIQUE: CT of the abdomen and pelvis was performed without the administration of intravenous contrast. Multiplanar reformatted images are provided for review. Automated exposure control, iterative reconstruction, and/or weight-based adjustment of the mA/kV was utilized to reduce the radiation dose to as low as reasonably achievable. COMPARISON: CT chest 10/21/2023, CT abdomen and pelvis 03/15/2024. CLINICAL HISTORY: Vesicointestinal fistula; history of colovesical fistula. Concern for recurrence with recurrent and severe UTI. FINDINGS: LOWER CHEST: Stable lower parenchymal scarring at the inferior right middle lobe. LIVER: The liver is unremarkable. GALLBLADDER AND BILE DUCTS: Post cholecystectomy. No biliary ductal dilatation. SPLEEN: No acute abnormality. PANCREAS: No acute abnormality. ADRENAL GLANDS: No acute abnormality. KIDNEYS, URETERS AND BLADDER: There is new mild hydroureter and hydronephrosis of the right collecting system. No obstructing lesion identified within the right ureter. The ureteral  dilatation extends to the pelvic brim. There is mild increase in left ureteral diameter additionally. The bladder is mildly distended. Small bubble of gas within the bladder. No nephrolithiasis. No perinephric or periureteral stranding. GI AND BOWEL: Stomach demonstrates no acute abnormality. There is anastomosis in the sigmoid colon. There is no bowel obstruction. PERITONEUM AND RETROPERITONEUM: No ascites. No free air. VASCULATURE: Aorta is normal in caliber. LYMPH NODES: No lymphadenopathy. REPRODUCTIVE ORGANS: No acute abnormality. BONES AND SOFT TISSUES: Bilateral hip prosthetics. Several surgical clips in the right lower quadrant. No acute osseous abnormality. No focal soft tissue abnormality. IMPRESSION: 1. New mild right hydroureteronephrosis with ureteral dilatation extending to the pelvic brim, without an identified ureteral obstructing lesion. 2. Mild increase in left ureteral diameter. 3. Mild bladder distention with a small intraluminal gas focus. No fissure identified. Small volume gas potentially related to instrumentation/catheterization . Electronically signed by: Norleen Boxer MD 06/30/2024 06:29 PM EST RP Workstation: [id number]   DG Chest 2 View Result Date: 06/30/2024 EXAM: 2 VIEW(S) XRAY OF THE CHEST 06/30/2024 12:24:00 PM COMPARISON: Chest radiograph 05/29/2024. CLINICAL HISTORY: Hypoxia. FINDINGS: LINES, TUBES AND DEVICES: Right chest port-a-cath tip overlies the mid to lower SVC. LUNGS AND PLEURA: No focal pulmonary opacity. No pulmonary edema. No pleural effusion. No pneumothorax. HEART AND MEDIASTINUM: No acute abnormality of the cardiac silhouette. BONES AND SOFT TISSUES: Degenerative changes of the thoracic spine. No acute osseous abnormality. IMPRESSION: 1. No acute cardiopulmonary findings. Electronically signed by: Harrietta Sherry MD 06/30/2024 01:37 PM EST RP Workstation: [id number]     Signature  -   Lavada Stank M.D on 07/02/2024 at 7:43 AM   -  To page go to [email or web address]

## 2024-07-02 NOTE — Plan of Care (Signed)

## 2024-07-02 NOTE — Evaluation (Signed)
 Physical Therapy Evaluation Patient Details Name: Valerie [name] MRN: [id number] DOB: 08/18/1961 Today's Date: 07/02/2024  History of Present Illness  The pt is a 63 yo female presenting 11/7 after a fall. Admitted for sepsis due to UTI, recent admission 10/6-10/13 for same. PMH significant for multiple medical comorbidities including: obesity (BMI 41), arthritis, anxiety, Bipolar 1 disorder, CKD stage IIIb, history of colovesicular fistula, dementia, depression, DMT2, HTN, hypothyroidism, Parkinson's/neuromuscular disorder, history of sleep apnea, and history of TIA/CVA.   Clinical Impression  Pt in bed upon arrival of PT, agreeable to evaluation at this time. Prior to admission the pt was ambulating without assistance, but using RW, working with [facility] therapies and reports no falls since last d/c. The pt was able to complete all mobility without need for assistance, but did continue to require use of RW for transfers and gait. Pt continues to present with deficits in RLE strength and dynamic stability, will benefit from skilled PT acutely and resume HHPT once medically stable for d/c.      If plan is discharge home, recommend the following: A little help with walking and/or transfers;A little help with bathing/dressing/bathroom;Assistance with cooking/housework;Assist for transportation;Help with stairs or ramp for entrance   Can travel by private vehicle        Equipment Recommendations None recommended by PT  Recommendations for Other Services       Functional Status Assessment Patient has had a recent decline in their functional status and demonstrates the ability to make significant improvements in function in a reasonable and predictable amount of time.     Precautions / Restrictions Precautions Precautions: Fall Recall of Precautions/Restrictions: Intact Restrictions Weight Bearing Restrictions Per Provider Order: No      Mobility  Bed Mobility Overal bed mobility: Modified  Independent             General bed mobility comments: sitting EOB upon arrival    Transfers Overall transfer level: Needs assistance Equipment used: Rolling walker (2 wheels) Transfers: Sit to/from Stand Sit to Stand: Supervision           General transfer comment: supervision in session, PT managed lines. no overt LOB or instability    Ambulation/Gait Ambulation/Gait assistance: Contact guard assist Gait Distance (Feet): 150 Feet Assistive device: Rolling walker (2 wheels) Gait Pattern/deviations: Step-through pattern, Decreased stride length Gait velocity: decreased Gait velocity interpretation: <1.31 ft/sec, indicative of household ambulator   General Gait Details: pt with slightly slowed gait, steady with BUE support. VSS on RA.     Balance Overall balance assessment: Needs assistance Sitting-balance support: No upper extremity supported Sitting balance-Leahy Scale: Good     Standing balance support: Bilateral upper extremity supported, During functional activity Standing balance-Leahy Scale: Fair Standing balance comment: BUE support for gait, can stand statically with single UE support                             Pertinent Vitals/Pain Pain Assessment Pain Assessment: 0-10 Pain Score: 4  Pain Location: headache (decreased from 4 to 2/10 in session) Pain Descriptors / Indicators: Headache Pain Intervention(s): Limited activity within patient's tolerance, Monitored during session, Premedicated before session, Repositioned    Home Living Family/patient expects to be discharged to:: Private residence Living Arrangements: Alone Available Help at Discharge: Family;Available PRN/intermittently Type of Home: House Home Access: Stairs to enter Entrance Stairs-Rails: None Entrance Stairs-Number of Steps: 1 Alternate Level Stairs-Number of Steps: 1 flight Home Layout: Two level;1/2 bath on  main level;Able to live on main level with  bedroom/bathroom Home Equipment: Rolling Walker (2 wheels);BSC/3in1;Cane - single point;Rollator (4 wheels);Grab bars - tub/shower;Shower seat;Wheelchair - manual Additional Comments: sister and friends can assist as needed    Prior Function Prior Level of Function : Independent/Modified Independent;History of Falls (last six months)             Mobility Comments: using RW since May, able to ambulate in home, working with HHPT on progressing ambulation distance ADLs Comments: independent ADLs, light IADLs, grocery delivery, no longer drives     Extremity/Trunk Assessment   Upper Extremity Assessment Upper Extremity Assessment: LUE deficits/detail;Generalized weakness (grossly 4-/5 to MMT) LUE Deficits / Details: grossly 4-/5 to MMT due to posterior shoulder pain from trying to pull up after sliding out of bed. pt reports tenderness to palpation along upper trap and levator.  able to move against gravity to 90 deg and maintain against light pressure LUE Sensation: WNL LUE Coordination: WNL    Lower Extremity Assessment Lower Extremity Assessment: Generalized weakness;RLE deficits/detail RLE Deficits / Details: hip flexion 3/5, hip abd 4/5, hip add 4-/5, ankle DF 4-/5 RLE Sensation: WNL RLE Coordination: WNL    Cervical / Trunk Assessment Cervical / Trunk Assessment: Other exceptions Cervical / Trunk Exceptions: increased body habitus  Communication   Communication Communication: No apparent difficulties    Cognition Arousal: Alert Behavior During Therapy: WFL for tasks assessed/performed   PT - Cognitive impairments: No apparent impairments                       PT - Cognition Comments: answering questions and following commands appropriately. not formally assessed Following commands: Intact       Cueing Cueing Techniques: Verbal cues     General Comments General comments (skin integrity, edema, etc.): VSS on RA    Exercises     Assessment/Plan     PT Assessment Patient needs continued PT services  PT Problem List Decreased activity tolerance;Decreased balance;Decreased mobility       PT Treatment Interventions DME instruction;Gait training;Stair training;Functional mobility training;Therapeutic activities;Therapeutic exercise;Balance training;Patient/family education    PT Goals (Current goals can be found in the Care Plan section)  Acute Rehab PT Goals Patient Stated Goal: to return home PT Goal Formulation: With patient Time For Goal Achievement: 07/16/24 Potential to Achieve Goals: Good    Frequency Min 2X/week        AM-PAC PT 6 Clicks Mobility  Outcome Measure Help needed turning from your back to your side while in a flat bed without using bedrails?: None Help needed moving from lying on your back to sitting on the side of a flat bed without using bedrails?: None Help needed moving to and from a bed to a chair (including a wheelchair)?: A Little Help needed standing up from a chair using your arms (e.g., wheelchair or bedside chair)?: A Little Help needed to walk in hospital room?: A Little Help needed climbing 3-5 steps with a railing? : A Little 6 Click Score: 20    End of Session Equipment Utilized During Treatment: Gait belt Activity Tolerance: Patient tolerated treatment well Patient left: in chair;with call bell/phone within reach Nurse Communication: Mobility status;Other (comment) (purewick cannister full) PT Visit Diagnosis: Unsteadiness on feet (R26.81);Other abnormalities of gait and mobility (R26.89);Muscle weakness (generalized) (M62.81)    Time: 8551-8484 PT Time Calculation (min) (ACUTE ONLY): 27 min   Charges:   PT Evaluation $PT Eval Low Complexity: 1 Low PT  Treatments $Gait Training: 8-22 mins PT General Charges $$ ACUTE PT VISIT: 1 Visit         Izetta Call, PT, DPT   Acute Rehabilitation Department Office [phone] Secure Chat Communication Preferred  Izetta JULIANNA Call 07/02/2024, 4:58 PM

## 2024-07-02 NOTE — Consult Note (Signed)
 Consult Note  Valerie [name] 03-09-1961  [id number].    Requesting MD:  Dr. 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 Sheldon in 2021, seizure disorder, bipolar disorder, anxiety, psoriatic arthritis, anemia, Parkinson disease, OSA, obesity, neuropathy that originally presented the hospital due to dysuria and weakness found to have spesis secondary to UTI.  Patient states that she started to experience dysuria about one week ago. She states that on Friday she fell out of her bed because of weakness leading to the hospital. Patient states that she has a history of recurrent UTIs and has been hospitalized for sepsis several times this year. She is still having dysuria. Otherwise she denies any other symptoms. Denies n/v, dizziness, headaches, SOB, CP, abdominal pain, concerns with bowel movements, difficulties with food intake.   Tobacco use: Former and quit in February. Vapes on a daily basis now Alcohol use: Denies Illicit drug use Denies Anticoagulation medications: Denies Allergies: Sertraline, Carbamazepine Surgical history: Colovesical fistula repair, cholecystectomy, appendectomy, abdominal hysterectomy, flexible sigmoidoscopy, left heart cath and coronary angiography, ORIF periprosthetic fracture, parathyroidectomy, proctoscopy, tonsillectomy, right knee arthroplasty and right hip arthroplasty  ROS: Per HPI  Family History  Problem Relation Age of Onset   Breast cancer Neg Hx     Past Medical History:  Diagnosis Date   Anemia    Anxiety    Arthritis    Psoriatic   Bipolar disorder (HCC)    CKD (chronic kidney disease), stage III (HCC)    Colovesical fistula    Dementia (HCC)    Depression    DM  type 2 (diabetes mellitus, type 2) (HCC) 11/14/2019   Dyspnea    E coli bacteremia 07/28/2023   Headache    Hyperthyroidism    Neuromuscular disorder (HCC)    Neuropathy    NSTEMI (non-ST elevated myocardial infarction) (HCC) 11/14/2019   OSA (obstructive sleep apnea)    Primary localized osteoarthritis of right knee 10/19/2023   Psoriatic arthritis (HCC)    Pyelonephritis 07/23/2023   Sleep apnea    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   Stroke (HCC)    ?history; MRI normal    Past Surgical History:  Procedure Laterality Date   ABDOMINAL HYSTERECTOMY     APPENDECTOMY     CHOLECYSTECTOMY     CYSTOSCOPY WITH STENT PLACEMENT N/A 07/17/2020   Procedure: CYSTOSCOPY WITH BILATERAL FIREFLY INJECTION;  Surgeon: Renda Glance, MD;  Location: [facility];  Service: Urology;  Laterality: N/A;   FLEXIBLE SIGMOIDOSCOPY N/A 05/17/2020   Procedure: FLEXIBLE SIGMOIDOSCOPY;  Surgeon: Rollin Dover, MD;  Location: [facility];  Service: Endoscopy;  Laterality: N/A;   FOOT SURGERY Right    IR FLUORO GUIDE CV LINE RIGHT  08/19/2020   IR IMAGING GUIDED PORT INSERTION  02/04/2024   IR RADIOLOGIST EVAL & MGMT  09/10/2020   IR RADIOLOGIST EVAL & MGMT  10/10/2020   IR REMOVAL TUN CV CATH W/O FL  09/16/2020   IR SINUS/FIST TUBE CHK-NON GI  10/24/2020   IR SINUS/FIST TUBE CHK-NON GI  10/31/2020   IR US  GUIDE VASC ACCESS RIGHT  08/19/2020   LEFT HEART CATH AND CORONARY ANGIOGRAPHY N/A 11/14/2019   Procedure: LEFT HEART CATH AND CORONARY ANGIOGRAPHY;  Surgeon: Jordan, Peter M, MD;  Location: [facility];  Service: Cardiovascular;  Laterality: N/A;   ORIF PERIPROSTHETIC FRACTURE Right 01/03/2024   Procedure: OPEN REDUCTION INTERNAL FIXATION (ORIF) PERIPROSTHETIC FRACTURE;  Surgeon: Kendal Franky SQUIBB, MD;  Location: [facility];  Service: Orthopedics;  Laterality: Right;   PARATHYROID  EXPLORATION N/A 01/09/2021   Procedure: POSSIBLE NECK EXPLORATION;  Surgeon: Eletha Boas, MD;  Location: [facility];  Service:  General;  Laterality: N/A;   PARATHYROIDECTOMY Left 01/09/2021   Procedure: LEFT INFERIOR PARATHYROIDECTOMY;  Surgeon: Eletha Boas, MD;  Location: [facility];  Service: General;  Laterality: Left;   PROCTOSCOPY N/A 07/17/2020   Procedure: RIGID PROCTOSCOPY;  Surgeon: Sheldon Standing, MD;  Location: [facility];  Service: General;  Laterality: N/A;   TONSILLECTOMY     TOTAL HIP ARTHROPLASTY Bilateral    TOTAL KNEE ARTHROPLASTY Right 10/19/2023   Procedure: TOTAL KNEE ARTHROPLASTY;  Surgeon: Josefina Chew, MD;  Location: [facility];  Service: Orthopedics;  Laterality: Right;    Social History:  reports that she has quit smoking. Her smoking use included cigarettes and e-cigarettes. She has been exposed to tobacco smoke. She has never used smokeless tobacco. She reports current alcohol use. She reports that she does not use drugs.  Allergies:  Allergies  Allergen Reactions   Carbamazepine Other (See Comments)    Parkinsons like symptoms, tremors   Sertraline Hcl Other (See Comments)    Parkinsons like symptoms    Medications Prior to Admission  Medication Sig Dispense Refill   aspirin  EC 81 MG tablet Take 81 mg by mouth daily. Swallow whole.     carbidopa -levodopa  (SINEMET  IR) 25-100 MG tablet Take 1 tablet by mouth in the morning and at bedtime.     desvenlafaxine (PRISTIQ) 25 MG 24 hr tablet Take 25 mg by mouth daily. (Patient taking differently: Take 50 mg by mouth daily.)     Docusate Calcium  (STOOL SOFTENER PO) Take 1 tablet by mouth in the morning and at bedtime.     donepezil  (ARICEPT ) 10 MG tablet Take 10 mg by mouth daily.     ENBREL 50 MG/ML injection Inject 50 mg into the skin once a week. Patient takes this medication on Mondays     fluconazole  (DIFLUCAN ) 150 MG tablet Take 150 mg by mouth as needed (Recurring UTI).     gabapentin  (NEURONTIN ) 300 MG capsule Take 300-600 mg by mouth See admin instructions. Take 300 mg in the morning and 600 mg at night     lamoTRIgine  (LAMICTAL ) 150 MG  tablet Take 1 tablet by mouth 2 (two) times daily.     Lecanemab-irmb (LEQEMBI IV) Inject into the vein every 14 (fourteen) days.     levETIRAcetam  (KEPPRA ) 750 MG tablet Take 750 mg by mouth 2 (two) times daily.     levocetirizine (XYZAL ) 5 MG tablet Take 1 tablet (5 mg total) by mouth every evening. (Patient taking differently: Take 5 mg by mouth as needed for allergies.) 90 tablet 3   meclizine  (ANTIVERT ) 25 MG tablet Take 25 mg by mouth 3 (three) times daily as needed for dizziness.     memantine  (NAMENDA ) 10 MG tablet Take 10 mg by mouth 2 (two) times daily.     Multiple Vitamins-Minerals (MULTIVITAMIN WITH MINERALS) tablet Take 1 tablet by mouth daily.     nitrofurantoin, macrocrystal-monohydrate, (MACROBID) 100 MG capsule Take 100 mg by mouth 2 (two) times daily.     Omega-3 Fatty Acids (FISH OIL PO) Take 2 capsules by mouth daily.     omeprazole  (PRILOSEC)  40 MG capsule Take 1 capsule (40 mg total) by mouth daily. 90 capsule 3   ondansetron  (ZOFRAN -ODT) 8 MG disintegrating tablet Take 8 mg by mouth every 8 (eight) hours as needed for nausea or vomiting.     oxybutynin  (DITROPAN ) 5 MG tablet Take 5 mg by mouth 2 (two) times daily.     polyethylene glycol (MIRALAX  / GLYCOLAX ) 17 g packet Take 17 g by mouth 2 (two) times daily. (Patient taking differently: Take 17 g by mouth daily as needed for mild constipation.)     predniSONE  (DELTASONE ) 5 MG tablet Take 5 mg by mouth daily.     primidone  (MYSOLINE ) 50 MG tablet Take 100 mg by mouth 2 (two) times daily.     rizatriptan  (MAXALT ) 10 MG tablet Take 10 mg by mouth as needed for migraine. May repeat in 2 hours if needed     rosuvastatin  (CRESTOR ) 10 MG tablet Take 1 tablet (10 mg total) by mouth daily. (Patient taking differently: Take 10 mg by mouth at bedtime.) 90 tablet 3   tiZANidine  (ZANAFLEX ) 4 MG tablet Take 1 tablet (4 mg total) by mouth 3 (three) times daily. (Patient taking differently: Take 4 mg by mouth as needed for muscle spasms.)  60 tablet 0   traZODone  (DESYREL ) 100 MG tablet Take 2 tablets (200 mg total) by mouth at bedtime. 60 tablet 0   nitroGLYCERIN  (NITROSTAT ) 0.4 MG SL tablet Place 1 tablet (0.4 mg total) under the tongue every 5 (five) minutes as needed for chest pain. (Patient not taking: Reported on 07/01/2024) 10 tablet 2   sennosides-docusate sodium  (SENOKOT-S) 8.6-50 MG tablet Take 2 tablets by mouth daily. (Patient not taking: Reported on 07/01/2024) 30 tablet 1   ZEPBOUND 2.5 MG/0.5ML Pen Inject 2.5 mg into the skin once a week. (Patient not taking: Reported on 07/01/2024)      Blood pressure (!) 98/56, pulse 65, temperature 97.7 F (36.5 C), temperature source Oral, resp. rate (!) 24, height 5' 2 (1.575 m), weight 103 kg, SpO2 (!) 87%. Physical Exam:  General: Pleasant female who is laying in bed in NAD. HEENT: Head is normocephalic, atraumatic.  Heart: HR normal during encounter. Palpable radial and pedal pulses bilaterally Lungs: CTAB, no wheezes, rhonchi, or rales noted.  Respiratory effort nonlabored. Abd: Soft, NT, ND. +BS. No rebound tenderness or guarding. MS: Able to move all 4 extremities. No edema noted. Skin: Warm and dry. Neuro: Speech is normal Psych: A&Ox3 with an appropriate affect.   Results for orders placed or performed during the hospital encounter of 06/30/24 (from the past 48 hours)  CBG monitoring, ED     Status: Abnormal   Collection Time: 06/30/24 12:15 PM  Result Value Ref Range   Glucose-Capillary 114 (H) 70 - 99 mg/dL    Comment: Glucose reference range applies only to samples taken after fasting for at least 8 hours.  I-Stat Lactic Acid, ED     Status: None   Collection Time: 06/30/24 12:17 PM  Result Value Ref Range   Lactic Acid, Venous 1.3 0.5 - 1.9 mmol/L  Culture, blood (x 2)     Status: None (Preliminary result)   Collection Time: 06/30/24  3:20 PM   Specimen: BLOOD RIGHT ARM  Result Value Ref Range   Specimen Description BLOOD RIGHT ARM    Special Requests       BOTTLES DRAWN AEROBIC AND ANAEROBIC Blood Culture adequate volume   Culture      NO GROWTH 2 DAYS Performed at  [facility], [address], [place], [place] [address]    Report Status PENDING   I-Stat Lactic Acid, ED     Status: None   Collection Time: 06/30/24  4:02 PM  Result Value Ref Range   Lactic Acid, Venous 1.6 0.5 - 1.9 mmol/L  CBG monitoring, ED     Status: None   Collection Time: 06/30/24  6:26 PM  Result Value Ref Range   Glucose-Capillary 83 70 - 99 mg/dL    Comment: Glucose reference range applies only to samples taken after fasting for at least 8 hours.  CBG monitoring, ED     Status: None   Collection Time: 06/30/24 11:27 PM  Result Value Ref Range   Glucose-Capillary 89 70 - 99 mg/dL    Comment: Glucose reference range applies only to samples taken after fasting for at least 8 hours.  Protime-INR     Status: None   Collection Time: 07/01/24  6:05 AM  Result Value Ref Range   Prothrombin Time 15.1 11.4 - 15.2 seconds   INR 1.1 0.8 - 1.2    Comment: (NOTE) INR goal varies based on device and disease states. Performed at [facility], [address], [place], [place] [address]   Comprehensive metabolic panel     Status: Abnormal   Collection Time: 07/01/24  6:05 AM  Result Value Ref Range   Sodium 142 135 - 145 mmol/L   Potassium 4.2 3.5 - 5.1 mmol/L   Chloride 104 98 - 111 mmol/L   CO2 29 22 - 32 mmol/L   Glucose, Bld 89 70 - 99 mg/dL    Comment: Glucose reference range applies only to samples taken after fasting for at least 8 hours.   BUN 15 8 - 23 mg/dL   Creatinine, Ser 8.43 (H) 0.44 - 1.00 mg/dL   Calcium  8.5 (L) 8.9 - 10.3 mg/dL   Total Protein 5.6 (L) 6.5 - 8.1 g/dL   Albumin  2.7 (L) 3.5 - 5.0 g/dL   AST 18 15 - 41 U/L   ALT 5 0 - 44 U/L   Alkaline Phosphatase 67 38 - 126 U/L   Total Bilirubin 0.7 0.0 - 1.2 mg/dL   GFR, Estimated 37 (L) >60 mL/min    Comment: (NOTE) Calculated using the CKD-EPI Creatinine Equation (2021)     Anion gap 9 5 - 15    Comment: Performed at [facility], [address], [place], [place] [address]  CBC     Status: Abnormal   Collection Time: 07/01/24  6:05 AM  Result Value Ref Range   WBC 6.0 4.0 - 10.5 K/uL   RBC 3.21 (L) 3.87 - 5.11 MIL/uL   Hemoglobin 9.8 (L) 12.0 - 15.0 g/dL   HCT 69.0 (L) 63.9 - 53.9 %   MCV 96.3 80.0 - 100.0 fL   MCH 30.5 26.0 - 34.0 pg   MCHC 31.7 30.0 - 36.0 g/dL   RDW 84.1 (H) 88.4 - 84.4 %   Platelets 154 150 - 400 K/uL   nRBC 0.0 0.0 - 0.2 %    Comment: Performed at [facility], [address], [place], [place] [address]  CBG monitoring, ED     Status: None   Collection Time: 07/01/24  7:49 AM  Result Value Ref Range   Glucose-Capillary 83 70 - 99 mg/dL    Comment: Glucose reference range applies only to samples taken after fasting for at least 8 hours.  Glucose,  capillary     Status: None   Collection Time: 07/01/24 12:23 PM  Result Value Ref Range   Glucose-Capillary 89 70 - 99 mg/dL    Comment: Glucose reference range applies only to samples taken after fasting for at least 8 hours.  Glucose, capillary     Status: Abnormal   Collection Time: 07/01/24  4:43 PM  Result Value Ref Range   Glucose-Capillary 132 (H) 70 - 99 mg/dL    Comment: Glucose reference range applies only to samples taken after fasting for at least 8 hours.  Glucose, capillary     Status: Abnormal   Collection Time: 07/01/24  8:32 PM  Result Value Ref Range   Glucose-Capillary 154 (H) 70 - 99 mg/dL    Comment: Glucose reference range applies only to samples taken after fasting for at least 8 hours.   Comment 1 Notify RN    Comment 2 Document in Chart   CBC with Differential/Platelet     Status: Abnormal   Collection Time: 07/02/24  7:13 AM  Result Value Ref Range   WBC 4.9 4.0 - 10.5 K/uL   RBC 3.05 (L) 3.87 - 5.11 MIL/uL   Hemoglobin 9.3 (L) 12.0 - 15.0 g/dL   HCT 70.4 (L) 63.9 - 53.9 %   MCV 96.7 80.0 - 100.0 fL   MCH 30.5 26.0 - 34.0 pg   MCHC 31.5  30.0 - 36.0 g/dL   RDW 84.0 (H) 88.4 - 84.4 %   Platelets 152 150 - 400 K/uL   nRBC 0.0 0.0 - 0.2 %   Neutrophils Relative % 62 %   Neutro Abs 3.0 1.7 - 7.7 K/uL   Lymphocytes Relative 18 %   Lymphs Abs 0.9 0.7 - 4.0 K/uL   Monocytes Relative 10 %   Monocytes Absolute 0.5 0.1 - 1.0 K/uL   Eosinophils Relative 9 %   Eosinophils Absolute 0.4 0.0 - 0.5 K/uL   Basophils Relative 1 %   Basophils Absolute 0.0 0.0 - 0.1 K/uL   Immature Granulocytes 0 %   Abs Immature Granulocytes 0.01 0.00 - 0.07 K/uL    Comment: Performed at [facility], [address] [place], [place] [address]  Comprehensive metabolic panel with GFR     Status: Abnormal   Collection Time: 07/02/24  7:13 AM  Result Value Ref Range   Sodium 140 135 - 145 mmol/L   Potassium 4.1 3.5 - 5.1 mmol/L   Chloride 103 98 - 111 mmol/L   CO2 26 22 - 32 mmol/L   Glucose, Bld 99 70 - 99 mg/dL    Comment: Glucose reference range applies only to samples taken after fasting for at least 8 hours.   BUN 15 8 - 23 mg/dL   Creatinine, Ser 8.49 (H) 0.44 - 1.00 mg/dL   Calcium  8.3 (L) 8.9 - 10.3 mg/dL   Total Protein 5.1 (L) 6.5 - 8.1 g/dL   Albumin  2.4 (L) 3.5 - 5.0 g/dL   AST 16 15 - 41 U/L   ALT <5 0 - 44 U/L   Alkaline Phosphatase 61 38 - 126 U/L   Total Bilirubin 0.4 0.0 - 1.2 mg/dL   GFR, Estimated 39 (L) >60 mL/min    Comment: (NOTE) Calculated using the CKD-EPI Creatinine Equation (2021)    Anion gap 11 5 - 15    Comment: Performed at [facility], [address], [place], [place] [address]  C-reactive protein     Status: Abnormal  Collection Time: 07/02/24  7:13 AM  Result Value Ref Range   CRP 5.3 (H) <1.0 mg/dL    Comment: Performed at [facility], [address], [place], [place] [address]  Magnesium      Status: None   Collection Time: 07/02/24  7:13 AM  Result Value Ref Range   Magnesium  1.8 1.7 - 2.4 mg/dL    Comment: Performed at [facility], [address] [place], [place] [address]   Procalcitonin     Status: None   Collection Time: 07/02/24  7:13 AM  Result Value Ref Range   Procalcitonin <0.10 ng/mL    Comment:        Interpretation: PCT (Procalcitonin) <= 0.5 ng/mL: Systemic infection (sepsis) is not likely. Local bacterial infection is possible. (NOTE)       Sepsis PCT Algorithm           Lower Respiratory Tract                                      Infection PCT Algorithm    ----------------------------     ----------------------------         PCT < 0.25 ng/mL                PCT < 0.10 ng/mL          Strongly encourage             Strongly discourage   discontinuation of antibiotics    initiation of antibiotics    ----------------------------     -----------------------------       PCT 0.25 - 0.50 ng/mL            PCT 0.10 - 0.25 ng/mL               OR       >80% decrease in PCT            Discourage initiation of                                            antibiotics      Encourage discontinuation           of antibiotics    ----------------------------     -----------------------------         PCT >= 0.50 ng/mL              PCT 0.26 - 0.50 ng/mL               AND        <80% decrease in PCT             Encourage initiation of                                             antibiotics       Encourage continuation           of antibiotics    ----------------------------     -----------------------------        PCT >= 0.50 ng/mL                  PCT > 0.50 ng/mL  AND         increase in PCT                  Strongly encourage                                      initiation of antibiotics    Strongly encourage escalation           of antibiotics                                     -----------------------------                                           PCT <= 0.25 ng/mL                                                 OR                                        > 80% decrease in PCT                                      Discontinue / Do not  initiate                                             antibiotics  Performed at [facility], [address] [place], [place] [address]   Glucose, capillary     Status: Abnormal   Collection Time: 07/02/24  8:31 AM  Result Value Ref Range   Glucose-Capillary 134 (H) 70 - 99 mg/dL    Comment: Glucose reference range applies only to samples taken after fasting for at least 8 hours.   CT ABDOMEN PELVIS WO CONTRAST Result Date: 06/30/2024 EXAM: CT ABDOMEN AND PELVIS WITHOUT CONTRAST 06/30/2024 05:44:55 PM TECHNIQUE: CT of the abdomen and pelvis was performed without the administration of intravenous contrast. Multiplanar reformatted images are provided for review. Automated exposure control, iterative reconstruction, and/or weight-based adjustment of the mA/kV was utilized to reduce the radiation dose to as low as reasonably achievable. COMPARISON: CT chest 10/21/2023, CT abdomen and pelvis 03/15/2024. CLINICAL HISTORY: Vesicointestinal fistula; history of colovesical fistula. Concern for recurrence with recurrent and severe UTI. FINDINGS: LOWER CHEST: Stable lower parenchymal scarring at the inferior right middle lobe. LIVER: The liver is unremarkable. GALLBLADDER AND BILE DUCTS: Post cholecystectomy. No biliary ductal dilatation. SPLEEN: No acute abnormality. PANCREAS: No acute abnormality. ADRENAL GLANDS: No acute abnormality. KIDNEYS, URETERS AND BLADDER: There is new mild hydroureter and hydronephrosis of the right collecting system. No obstructing lesion identified within the right ureter. The ureteral dilatation extends to the pelvic brim. There is mild increase in left ureteral diameter additionally. The bladder is mildly distended. Small bubble of gas  within the bladder. No nephrolithiasis. No perinephric or periureteral stranding. GI AND BOWEL: Stomach demonstrates no acute abnormality. There is anastomosis in the sigmoid colon. There is no bowel obstruction. PERITONEUM AND RETROPERITONEUM:  No ascites. No free air. VASCULATURE: Aorta is normal in caliber. LYMPH NODES: No lymphadenopathy. REPRODUCTIVE ORGANS: No acute abnormality. BONES AND SOFT TISSUES: Bilateral hip prosthetics. Several surgical clips in the right lower quadrant. No acute osseous abnormality. No focal soft tissue abnormality. IMPRESSION: 1. New mild right hydroureteronephrosis with ureteral dilatation extending to the pelvic brim, without an identified ureteral obstructing lesion. 2. Mild increase in left ureteral diameter. 3. Mild bladder distention with a small intraluminal gas focus. No fissure identified. Small volume gas potentially related to instrumentation/catheterization . Electronically signed by: Norleen Boxer MD 06/30/2024 06:29 PM EST RP Workstation: [id number]   DG Chest 2 View Result Date: 06/30/2024 EXAM: 2 VIEW(S) XRAY OF THE CHEST 06/30/2024 12:24:00 PM COMPARISON: Chest radiograph 05/29/2024. CLINICAL HISTORY: Hypoxia. FINDINGS: LINES, TUBES AND DEVICES: Right chest port-a-cath tip overlies the mid to lower SVC. LUNGS AND PLEURA: No focal pulmonary opacity. No pulmonary edema. No pleural effusion. No pneumothorax. HEART AND MEDIASTINUM: No acute abnormality of the cardiac silhouette. BONES AND SOFT TISSUES: Degenerative changes of the thoracic spine. No acute osseous abnormality. IMPRESSION: 1. No acute cardiopulmonary findings. Electronically signed by: Shahmeer Marcelino MD 06/30/2024 01:37 PM EST RP Workstation: [id number]      Assessment/Plan Sepsis secondary to UTI History of colovesical fistula s/p robotic low anterior resection, tap block colovesical fistula repair mobilization of splenic flexure, omental pedicle flap with omentopexy of bladder repair by Dr. Sheldon in 2021. -CT abd/pelvis wo contrast on 11/7 showed new mild right hydroureteronephrosis with ureteral dilatation extending to the pelvic brim, without an identified ureteral obstructing lesion. Mild increase in left ureteral diameter. Mild  bladder distention with a small intraluminal gas focus. No fissure identified. Small volume gas potentially related to instrumentation/catheterization. -Afebrile. -WBC 4.9, HGB 9.3 -Patient does not complain of any other symptoms other than dysuria.  -Agree with IV antibiotics -Discussed imaging with attending. Will plan for CT cystogram pelvis. -Will continue to follow.    FEN: Regular; IVF per hospital team. VTE: Enoxaparin  ID: [id number]   I reviewed specialist notes, nursing notes, hospitalist notes, last 24 h vitals and pain scores, last 48 h intake and output, last 24 h labs and trends, and last 24 h imaging results.  This care required high  level of medical decision making.   Marjorie Carlyon Favre, [facility] Surgery 07/02/2024, 12:13 PM Please see Amion for pager number during day hours 7:00am-4:30pm

## 2024-07-02 NOTE — Progress Notes (Signed)
 PT Cancellation Note  Patient Details Name: Valerie [name] MRN: [id number] DOB: [birth year]   Cancelled Treatment:    Reason Eval/Treat Not Completed: Pain limiting ability to participate (headache) at this time, and it is not time for pain medications. Will continue to check back and evaluate as time/schedule allows.   Izetta Call, PT, DPT   Acute Rehabilitation Department Office [phone] Secure Chat Communication Preferred   Izetta JULIANNA Call 07/02/2024, 1:35 PM

## 2024-07-03 ENCOUNTER — Inpatient Hospital Stay (HOSPITAL_COMMUNITY)

## 2024-07-03 DIAGNOSIS — A419 Sepsis, unspecified organism: Secondary | ICD-10-CM | POA: Diagnosis not present

## 2024-07-03 DIAGNOSIS — N390 Urinary tract infection, site not specified: Secondary | ICD-10-CM | POA: Diagnosis not present

## 2024-07-03 LAB — COMPREHENSIVE METABOLIC PANEL WITH GFR
ALT: <5 U/L (ref 0–44)
AST: 15 U/L (ref 15–41)
Albumin: 2.4 g/dL — ABNORMAL LOW (ref 3.5–5.0)
Alkaline Phosphatase: 63 U/L (ref 38–126)
Anion gap: 9 (ref 5–15)
BUN: 14 mg/dL (ref 8–23)
CO2: 28 mmol/L (ref 22–32)
Calcium: 8.4 mg/dL — ABNORMAL LOW (ref 8.9–10.3)
Chloride: 105 mmol/L (ref 98–111)
Creatinine, Ser: 1.43 mg/dL — ABNORMAL HIGH (ref 0.44–1.00)
GFR, Estimated: 41 mL/min — ABNORMAL LOW (ref >60)
Glucose, Bld: 94 mg/dL (ref 70–99)
Potassium: 3.7 mmol/L (ref 3.5–5.1)
Sodium: 142 mmol/L (ref 135–145)
Total Bilirubin: 0.5 mg/dL (ref 0.0–1.2)
Total Protein: 5.3 g/dL — ABNORMAL LOW (ref 6.5–8.1)

## 2024-07-03 LAB — GLUCOSE, CAPILLARY
Glucose-Capillary: 100 mg/dL — ABNORMAL HIGH (ref 70–99)
Glucose-Capillary: 109 mg/dL — ABNORMAL HIGH (ref 70–99)
Glucose-Capillary: 135 mg/dL — ABNORMAL HIGH (ref 70–99)
Glucose-Capillary: 146 mg/dL — ABNORMAL HIGH (ref 70–99)

## 2024-07-03 LAB — CBC WITH DIFFERENTIAL/PLATELET
Abs Immature Granulocytes: 0.01 K/uL (ref 0.00–0.07)
Basophils Absolute: 0 K/uL (ref 0.0–0.1)
Basophils Relative: 1 %
Eosinophils Absolute: 0.4 K/uL (ref 0.0–0.5)
Eosinophils Relative: 10 %
HCT: 29 % — ABNORMAL LOW (ref 36.0–46.0)
Hemoglobin: 9.3 g/dL — ABNORMAL LOW (ref 12.0–15.0)
Immature Granulocytes: 0 %
Lymphocytes Relative: 19 %
Lymphs Abs: 0.8 K/uL (ref 0.7–4.0)
MCH: 30.7 pg (ref 26.0–34.0)
MCHC: 32.1 g/dL (ref 30.0–36.0)
MCV: 95.7 fL (ref 80.0–100.0)
Monocytes Absolute: 0.4 K/uL (ref 0.1–1.0)
Monocytes Relative: 9 %
Neutro Abs: 2.7 K/uL (ref 1.7–7.7)
Neutrophils Relative %: 61 %
Platelets: 166 K/uL (ref 150–400)
RBC: 3.03 MIL/uL — ABNORMAL LOW (ref 3.87–5.11)
RDW: 15.7 % — ABNORMAL HIGH (ref 11.5–15.5)
WBC: 4.3 K/uL (ref 4.0–10.5)
nRBC: 0 % (ref 0.0–0.2)

## 2024-07-03 LAB — URINE CULTURE: Culture: 30000 — AB

## 2024-07-03 LAB — C-REACTIVE PROTEIN: CRP: 3 mg/dL — ABNORMAL HIGH (ref <1.0)

## 2024-07-03 LAB — PROCALCITONIN: Procalcitonin: <0.1 ng/mL

## 2024-07-03 LAB — MAGNESIUM: Magnesium: 1.8 mg/dL (ref 1.7–2.4)

## 2024-07-03 MED ORDER — IOHEXOL 300 MG/ML  SOLN
50.0000 mL | Freq: Once | INTRAMUSCULAR | Status: AC | PRN
  Administered 2024-07-03: 50 mL

## 2024-07-03 MED ORDER — ESTRADIOL 0.01 % VA CREA
1.0000 | TOPICAL_CREAM | Freq: Every day | VAGINAL | Status: DC
  Administered 2024-07-03: 1 via VAGINAL
  Filled 2024-07-03: qty 42.5

## 2024-07-03 MED ORDER — SODIUM CHLORIDE 0.9 % IV SOLN
2.0000 g | INTRAVENOUS | Status: DC
  Administered 2024-07-03: 2 g via INTRAVENOUS
  Filled 2024-07-03: qty 20

## 2024-07-03 MED ORDER — IOHEXOL 9 MG/ML PO SOLN
ORAL | Status: AC
  Filled 2024-07-03: qty 500

## 2024-07-03 NOTE — Progress Notes (Signed)
   07/03/24 1112  Mobility  Activity Ambulated with assistance  Level of Assistance Standby assist, set-up cues, supervision of patient - no hands on  Assistive Device Front wheel walker  Distance Ambulated (ft) 20 ft  Activity Response Tolerated fair  Mobility Referral Yes  Mobility visit 1 Mobility  Mobility Specialist Start Time (ACUTE ONLY) 1112  Mobility Specialist Stop Time (ACUTE ONLY) 1122  Mobility Specialist Time Calculation (min) (ACUTE ONLY) 10 min   Mobility Specialist: Progress Note   Pt agreeable to mobility session - received in transport chair. C/o BM urgency, pericare performed ind. Returned to [facility] with all needs met - call bell within reach.   Additional comments: Pt ambulated to chair then back to BR d/t BM urgency.  Pt instructed to pull help string when finished - RN notified.   Virgle Boards, BS Mobility Specialist Please contact via SecureChat or  Rehab office at [phone].

## 2024-07-03 NOTE — Progress Notes (Signed)
 Plan of Care Note (Preliminary telephone/chart evaluation)      Valerie [name]  17-Feb-1961 [id number]  CARE TEAM:  PCP: Arloa Jarvis, NP  Outpatient Care Team: Patient Care Team: Arloa Jarvis, NP as PCP - General (Nurse Practitioner) Loni Soyla LABOR, MD as PCP - Cardiology (Cardiology) Selma Donnice SAUNDERS, MD as Consulting Physician (Urology) Sheldon Standing, MD as Consulting Physician (Colon and Rectal Surgery) Rollin Dover, MD as Consulting Physician (Gastroenterology) Loni Soyla LABOR, MD as Consulting Physician (Cardiology) Selma Donnice SAUNDERS, MD as Consulting Physician (Urology) Trixie File, MD as Consulting Physician (Endocrinology) Overton Constance DASEN, MD as Consulting Physician (Infectious Diseases)  Inpatient Treatment Team: Treatment Team:  Dennise Lavada POUR, MD Bobbette Lights, MD [facility], Md, MD Carolee Sherwood JONETTA DOUGLAS, MD Jama Kara LABOR, RN Bari Delon JONETTA, RPH Lurena Luana RAMAN, NT Fanbissi Ketchiozo, [place], RN Rayyan, Inocente RAMAN KEN Vicci Wilbert KATHEE, RN Nola Devere SAILOR, RN Sheldon Standing, MD    Curbside by Dr. Carolee who is covering for urology this weekend about this patient that I operated on in 2021.  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  Complicated with delayed bladder leak eventually controlled with Foley catheter and pelvic peritoneal drainage.  That was all in 2021.  Have not seen since.  Patient has recurrent UTIs which raises concern of recurrent colovesical fistula.  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  Should be responsive to the Macrobid that she has been chronically on but here we are.  Rather unlikely but this patient is high risk.  Odd that she would get symptoms for years later.  Does not sound like she has pneumaturia or fecal urea.  I  reviewed the CAT scan with Dr. Carolee and the urology team per Dr. Margot request since he was on this weekend and ran into me..  I do see a small diverticulum on the anterior bladder wall but there is no contrast going into the rectosigmoid.  There is no extravasation.  The staple line of the colorectal anastomosis is nearby but I do not see any definite leak.  There is no evidence of any inflammation around the colon or rectum such as recurrent diverticulitis or other issues.  Really no concerning inflammation or phlegmon.  Maybe that could be suppressed with her chronic him suppression but you would expect some concerning thickening or inflammation.  Given the concern in this relatively high risk person, I would recommend a transanal CT contrast enema through the rectum to see.    Dr. Carolee to relay to Dr. Selma who is oncoming covering for inpatient urology this week.  They will order.    If there is no extravasation from the rectosigmoid and around the anastomosis, that is reassuring.    If she truly has a significant colovesical fistula, that may require -Fecal diversion with a diverting loop ileostomy and then a delayed attempt at surgical repair/closure although I am guarded on this medically fragile chronically immunosuppressed woman on that unless she is truly getting significant urosepsis issues.  Would have to be discussion.  I would not rush to that.    Another option to see if urology feels that her symptoms could be managed with a different antibiotic suppressive regimen if not controlled with current nitrofurantoin/Macrobid.     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 [place], [place] [address] [phone] Fax [phone] Main  CONTACT INFORMATION: Weekday (9AM-5PM): Call [facility] main office at [phone] Weeknight (5PM-9AM) or Weekend/Holiday: Check EPIC Web Links tab & use AMION (password  [email or web address]) for General Surgery [facility] coverage  Please, DO NOT use SecureChat  (it is not reliable communication to reach operating surgeons & will lead to a delay in care).   Epic staff messaging available for outpatient concerns needing 1-2 business day response.      07/03/2024     ########################################################################################################    Past Medical History:  Diagnosis Date   Anemia    Anxiety    Arthritis    Psoriatic   Bipolar disorder (HCC)    CKD (chronic kidney disease), stage III (HCC)    Colovesical fistula    Dementia (HCC)    Depression    DM type 2 (diabetes mellitus, type 2) (HCC) 11/14/2019   Dyspnea    E coli bacteremia 07/28/2023   Headache    Hyperthyroidism    Neuromuscular disorder (HCC)    Neuropathy    NSTEMI (non-ST elevated myocardial infarction) (HCC) 11/14/2019   OSA (obstructive sleep apnea)    Primary localized osteoarthritis of right knee 10/19/2023   Psoriatic arthritis (HCC)    Pyelonephritis 07/23/2023   Sleep apnea    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   Stroke (HCC)     ?history; MRI normal    Past Surgical History:  Procedure Laterality Date   ABDOMINAL HYSTERECTOMY     APPENDECTOMY     CHOLECYSTECTOMY     CYSTOSCOPY WITH STENT PLACEMENT N/A 07/17/2020   Procedure: CYSTOSCOPY WITH BILATERAL FIREFLY INJECTION;  Surgeon: Renda Glance, MD;  Location: [facility];  Service: Urology;  Laterality: N/A;   FLEXIBLE SIGMOIDOSCOPY N/A 05/17/2020   Procedure: FLEXIBLE SIGMOIDOSCOPY;  Surgeon: Rollin Dover, MD;  Location: [facility];  Service: Endoscopy;  Laterality: N/A;   FOOT SURGERY Right    IR FLUORO GUIDE CV LINE RIGHT  08/19/2020   IR IMAGING GUIDED PORT INSERTION  02/04/2024   IR RADIOLOGIST EVAL & MGMT  09/10/2020   IR RADIOLOGIST EVAL & MGMT  10/10/2020   IR REMOVAL TUN CV CATH W/O FL  09/16/2020   IR SINUS/FIST TUBE CHK-NON GI  10/24/2020   IR SINUS/FIST TUBE CHK-NON GI  10/31/2020   IR US  GUIDE VASC ACCESS RIGHT  08/19/2020   LEFT HEART CATH AND CORONARY ANGIOGRAPHY N/A 11/14/2019   Procedure: LEFT HEART CATH AND CORONARY ANGIOGRAPHY;  Surgeon: Jordan, Peter M, MD;  Location: [facility] INVASIVE CV LAB;  Service: Cardiovascular;  Laterality: N/A;   ORIF  PERIPROSTHETIC FRACTURE Right 01/03/2024   Procedure: OPEN REDUCTION INTERNAL FIXATION (ORIF) PERIPROSTHETIC FRACTURE;  Surgeon: Kendal Franky SQUIBB, MD;  Location: [facility];  Service: Orthopedics;  Laterality: Right;   PARATHYROID  EXPLORATION N/A 01/09/2021   Procedure: POSSIBLE NECK EXPLORATION;  Surgeon: Eletha Boas, MD;  Location: [facility];  Service: General;  Laterality: N/A;   PARATHYROIDECTOMY Left 01/09/2021   Procedure: LEFT INFERIOR PARATHYROIDECTOMY;  Surgeon: Eletha Boas, MD;  Location: [facility];  Service: General;  Laterality: Left;   PROCTOSCOPY N/A 07/17/2020   Procedure: RIGID PROCTOSCOPY;  Surgeon: Sheldon Standing, MD;  Location: [facility];  Service: General;  Laterality: N/A;   TONSILLECTOMY     TOTAL HIP ARTHROPLASTY Bilateral    TOTAL KNEE ARTHROPLASTY Right 10/19/2023   Procedure: TOTAL KNEE ARTHROPLASTY;   Surgeon: Josefina Chew, MD;  Location: [facility];  Service: Orthopedics;  Laterality: Right;    Social History   Socioeconomic History   Marital status: Divorced    Spouse name: Not on file   Number of children: 2   Years of education: Not on file   Highest education level: Associate degree: occupational, scientist, product/process development, or vocational program  Occupational History   Occupation: Disabled  Tobacco Use   Smoking status: Former    Current packs/day: 0.50    Types: Cigarettes, E-cigarettes    Passive exposure: Past   Smokeless tobacco: Never  Vaping Use   Vaping status: Every Day  Substance and Sexual Activity   Alcohol use: Yes    Comment: rarely   Drug use: Never   Sexual activity: Not on file  Other Topics Concern   Not on file  Social History Narrative   Not on file   Social Drivers of Health   Financial Resource Strain: High Risk (01/11/2023)   Overall Financial Resource Strain (CARDIA)    Difficulty of Paying Living Expenses: Very hard  Food Insecurity: No Food Insecurity (06/30/2024)   Hunger Vital Sign    Worried About Running Out of Food in the Last Year: Never true    Ran Out of Food in the Last Year: Never true  Transportation Needs: No Transportation Needs (06/30/2024)   PRAPARE - Administrator, Civil Service (Medical): No    Lack of Transportation (Non-Medical): No  Physical Activity: Inactive (09/21/2023)   Exercise Vital Sign    Days of Exercise per Week: 0 days    Minutes of Exercise per Session: 0 min  Stress: Stress Concern Present (09/21/2023)   [employer] of Occupational Health - Occupational Stress Questionnaire    Feeling of Stress : To some extent  Social Connections: Socially Isolated (10/21/2023)   Social Connection and Isolation Panel    Frequency of Communication with Friends and Family: Once a week    Frequency of Social Gatherings with Friends and Family: Once a week    Attends Religious Services: Never    Database Administrator or  Organizations: No    Attends Banker Meetings: Never    Marital Status: Divorced  Catering Manager Violence: Not At Risk (06/30/2024)   Humiliation, Afraid, Rape, and Kick questionnaire    Fear of Current or Ex-Partner: No    Emotionally Abused: No    Physically Abused: No    Sexually Abused: No    Family History  Problem Relation Age of Onset   Breast cancer Neg Hx     Current Facility-Administered Medications  Medication Dose Route Frequency Provider Last Rate Last Admin   acetaminophen  (TYLENOL ) tablet  650 mg  650 mg Oral Q6H PRN Singh, Prashant K, MD       Or   acetaminophen  (TYLENOL ) suppository 650 mg  650 mg Rectal Q6H PRN Singh, Prashant K, MD       aspirin  EC tablet 81 mg  81 mg Oral Daily Melvin, Alexander B, MD   81 mg at 07/02/24 1029   baclofen  (LIORESAL ) tablet 5 mg  5 mg Oral TID PRN Singh, Prashant K, MD   5 mg at 07/02/24 1425   carbidopa -levodopa  (SINEMET  IR) 25-100 MG per tablet immediate release 1 tablet  1 tablet Oral BID Melvin, Alexander B, MD   1 tablet at 07/02/24 2048   ceFEPIme (MAXIPIME) 2 g in sodium chloride  0.9 % 100 mL IVPB  2 g Intravenous Q12H Gaines Carrier, RPH 200 mL/hr at 07/03/24 0321 2 g at 07/03/24 0321   Chlorhexidine  Gluconate Cloth 2 % PADS 6 each  6 each Topical Daily Melvin, Alexander B, MD   6 each at 07/02/24 1025   donepezil  (ARICEPT ) tablet 10 mg  10 mg Oral Daily Melvin, Alexander B, MD   10 mg at 07/02/24 1011   enoxaparin  (LOVENOX ) injection 40 mg  40 mg Subcutaneous Q24H Melvin, Alexander B, MD   40 mg at 07/02/24 1819   gabapentin  (NEURONTIN ) capsule 300 mg  300 mg Oral q morning Seena Marsa NOVAK, MD   300 mg at 07/02/24 1010   And   gabapentin  (NEURONTIN ) capsule 600 mg  600 mg Oral QHS Melvin, Alexander B, MD   600 mg at 07/02/24 2228   insulin  aspart (novoLOG ) injection 0-15 Units  0-15 Units Subcutaneous TID WC Melvin, Alexander B, MD   2 Units at 07/02/24 1016   lamoTRIgine  (LAMICTAL ) tablet 150 mg  150 mg  Oral BID Melvin, Alexander B, MD   150 mg at 07/02/24 2227   levETIRAcetam  (KEPPRA ) tablet 750 mg  750 mg Oral BID Melvin, Alexander B, MD   750 mg at 07/02/24 2227   memantine  (NAMENDA ) tablet 10 mg  10 mg Oral BID Melvin, Alexander B, MD   10 mg at 07/02/24 2226   midodrine  (PROAMATINE ) tablet 10 mg  10 mg Oral TID WC Mansy, Jan A, MD   10 mg at 07/02/24 1818   phenazopyridine  (PYRIDIUM ) tablet 100 mg  100 mg Oral TID WC Carolee Sherwood BIRCH III, MD       polyethylene glycol (MIRALAX  / GLYCOLAX ) packet 17 g  17 g Oral Daily PRN Melvin, Alexander B, MD       rosuvastatin  (CRESTOR ) tablet 10 mg  10 mg Oral Daily Melvin, Alexander B, MD       sodium chloride  flush (NS) 0.9 % injection 10-40 mL  10-40 mL Intracatheter PRN Seena Marsa NOVAK, MD       sodium chloride  flush (NS) 0.9 % injection 3 mL  3 mL Intravenous Q12H Melvin, Alexander B, MD   3 mL at 07/02/24 2228   traMADol  (ULTRAM ) tablet 50 mg  50 mg Oral Q6H PRN Melvin, Alexander B, MD   50 mg at 07/02/24 1032   traZODone  (DESYREL ) tablet 200 mg  200 mg Oral QHS Melvin, Alexander B, MD   200 mg at 07/02/24 2227   venlafaxine XR (EFFEXOR-XR) 24 hr capsule 75 mg  75 mg Oral Q breakfast Melvin, Alexander B, MD   75 mg at 07/02/24 1029     Allergies  Allergen Reactions   Carbamazepine Other (See Comments)    Parkinsons like symptoms,  tremors   Sertraline Hcl Other (See Comments)    Parkinsons like symptoms     BP (!) 91/52 (BP Location: Right Arm)   Pulse 62   Temp 98.2 F (36.8 C) (Oral)   Resp 16   Ht 5' 2 (1.575 m)   Wt 103 kg   SpO2 94%   BMI 41.53 kg/m     Results:   Labs: Results for orders placed or performed during the hospital encounter of 06/30/24 (from the past 48 hours)  Glucose, capillary     Status: None   Collection Time: 07/01/24 12:23 PM  Result Value Ref Range   Glucose-Capillary 89 70 - 99 mg/dL    Comment: Glucose reference range applies only to samples taken after fasting for at least 8 hours.  Glucose,  capillary     Status: Abnormal   Collection Time: 07/01/24  4:43 PM  Result Value Ref Range   Glucose-Capillary 132 (H) 70 - 99 mg/dL    Comment: Glucose reference range applies only to samples taken after fasting for at least 8 hours.  Glucose, capillary     Status: Abnormal   Collection Time: 07/01/24  8:32 PM  Result Value Ref Range   Glucose-Capillary 154 (H) 70 - 99 mg/dL    Comment: Glucose reference range applies only to samples taken after fasting for at least 8 hours.   Comment 1 Notify RN    Comment 2 Document in Chart   CBC with Differential/Platelet     Status: Abnormal   Collection Time: 07/02/24  7:13 AM  Result Value Ref Range   WBC 4.9 4.0 - 10.5 K/uL   RBC 3.05 (L) 3.87 - 5.11 MIL/uL   Hemoglobin 9.3 (L) 12.0 - 15.0 g/dL   HCT 70.4 (L) 63.9 - 53.9 %   MCV 96.7 80.0 - 100.0 fL   MCH 30.5 26.0 - 34.0 pg   MCHC 31.5 30.0 - 36.0 g/dL   RDW 84.0 (H) 88.4 - 84.4 %   Platelets 152 150 - 400 K/uL   nRBC 0.0 0.0 - 0.2 %   Neutrophils Relative % 62 %   Neutro Abs 3.0 1.7 - 7.7 K/uL   Lymphocytes Relative 18 %   Lymphs Abs 0.9 0.7 - 4.0 K/uL   Monocytes Relative 10 %   Monocytes Absolute 0.5 0.1 - 1.0 K/uL   Eosinophils Relative 9 %   Eosinophils Absolute 0.4 0.0 - 0.5 K/uL   Basophils Relative 1 %   Basophils Absolute 0.0 0.0 - 0.1 K/uL   Immature Granulocytes 0 %   Abs Immature Granulocytes 0.01 0.00 - 0.07 K/uL    Comment: Performed at [facility], [address], [place], [place] [address]  Comprehensive metabolic panel with GFR     Status: Abnormal   Collection Time: 07/02/24  7:13 AM  Result Value Ref Range   Sodium 140 135 - 145 mmol/L   Potassium 4.1 3.5 - 5.1 mmol/L   Chloride 103 98 - 111 mmol/L   CO2 26 22 - 32 mmol/L   Glucose, Bld 99 70 - 99 mg/dL    Comment: Glucose reference range applies only to samples taken after fasting for at least 8 hours.   BUN 15 8 - 23 mg/dL   Creatinine, Ser 8.49 (H) 0.44 - 1.00 mg/dL   Calcium  8.3 (L) 8.9 - 10.3  mg/dL   Total Protein 5.1 (L) 6.5 - 8.1 g/dL   Albumin  2.4 (L) 3.5 - 5.0 g/dL  AST 16 15 - 41 U/L   ALT <5 0 - 44 U/L   Alkaline Phosphatase 61 38 - 126 U/L   Total Bilirubin 0.4 0.0 - 1.2 mg/dL   GFR, Estimated 39 (L) >60 mL/min    Comment: (NOTE) Calculated using the CKD-EPI Creatinine Equation (2021)    Anion gap 11 5 - 15    Comment: Performed at [facility], [address] [place], [place] [address]  C-reactive protein     Status: Abnormal   Collection Time: 07/02/24  7:13 AM  Result Value Ref Range   CRP 5.3 (H) <1.0 mg/dL    Comment: Performed at [facility], [address], [place], [place] [address]  Magnesium      Status: None   Collection Time: 07/02/24  7:13 AM  Result Value Ref Range   Magnesium  1.8 1.7 - 2.4 mg/dL    Comment: Performed at [facility], [address] [place], [place] [address]  Procalcitonin     Status: None   Collection Time: 07/02/24  7:13 AM  Result Value Ref Range   Procalcitonin <0.10 ng/mL    Comment:        Interpretation: PCT (Procalcitonin) <= 0.5 ng/mL: Systemic infection (sepsis) is not likely. Local bacterial infection is possible. (NOTE)       Sepsis PCT Algorithm           Lower Respiratory Tract                                      Infection PCT Algorithm    ----------------------------     ----------------------------         PCT < 0.25 ng/mL                PCT < 0.10 ng/mL          Strongly encourage             Strongly discourage   discontinuation of antibiotics    initiation of antibiotics    ----------------------------     -----------------------------       PCT 0.25 - 0.50 ng/mL            PCT 0.10 - 0.25 ng/mL               OR       >80% decrease in PCT            Discourage initiation of                                            antibiotics      Encourage discontinuation           of antibiotics    ----------------------------     -----------------------------         PCT >= 0.50 ng/mL               PCT 0.26 - 0.50 ng/mL               AND        <80% decrease in PCT             Encourage initiation of  antibiotics       Encourage continuation           of antibiotics    ----------------------------     -----------------------------        PCT >= 0.50 ng/mL                  PCT > 0.50 ng/mL               AND         increase in PCT                  Strongly encourage                                      initiation of antibiotics    Strongly encourage escalation           of antibiotics                                     -----------------------------                                           PCT <= 0.25 ng/mL                                                 OR                                        > 80% decrease in PCT                                      Discontinue / Do not initiate                                             antibiotics  Performed at [facility], [address] [place], [place] [address]   Glucose, capillary     Status: Abnormal   Collection Time: 07/02/24  8:31 AM  Result Value Ref Range   Glucose-Capillary 134 (H) 70 - 99 mg/dL    Comment: Glucose reference range applies only to samples taken after fasting for at least 8 hours.  Glucose, capillary     Status: None   Collection Time: 07/02/24 11:46 AM  Result Value Ref Range   Glucose-Capillary 85 70 - 99 mg/dL    Comment: Glucose reference range applies only to samples taken after fasting for at least 8 hours.  Glucose, capillary     Status: Abnormal   Collection Time: 07/02/24  4:31 PM  Result Value Ref Range   Glucose-Capillary 112 (H) 70 - 99 mg/dL    Comment: Glucose reference range applies only to samples taken after fasting for at least 8 hours.  Glucose, capillary     Status: None  Collection Time: 07/02/24  8:39 PM  Result Value Ref Range   Glucose-Capillary 90 70 - 99 mg/dL    Comment: Glucose reference range applies only to  samples taken after fasting for at least 8 hours.   Comment 1 Notify RN    Comment 2 Document in Chart   Procalcitonin     Status: None   Collection Time: 07/03/24  4:13 AM  Result Value Ref Range   Procalcitonin <0.10 ng/mL    Comment:        Interpretation: PCT (Procalcitonin) <= 0.5 ng/mL: Systemic infection (sepsis) is not likely. Local bacterial infection is possible. (NOTE)       Sepsis PCT Algorithm           Lower Respiratory Tract                                      Infection PCT Algorithm    ----------------------------     ----------------------------         PCT < 0.25 ng/mL                PCT < 0.10 ng/mL          Strongly encourage             Strongly discourage   discontinuation of antibiotics    initiation of antibiotics    ----------------------------     -----------------------------       PCT 0.25 - 0.50 ng/mL            PCT 0.10 - 0.25 ng/mL               OR       >80% decrease in PCT            Discourage initiation of                                            antibiotics      Encourage discontinuation           of antibiotics    ----------------------------     -----------------------------         PCT >= 0.50 ng/mL              PCT 0.26 - 0.50 ng/mL               AND        <80% decrease in PCT             Encourage initiation of                                             antibiotics       Encourage continuation           of antibiotics    ----------------------------     -----------------------------        PCT >= 0.50 ng/mL                  PCT > 0.50 ng/mL               AND         increase in PCT  Strongly encourage                                      initiation of antibiotics    Strongly encourage escalation           of antibiotics                                     -----------------------------                                           PCT <= 0.25 ng/mL                                                 OR                                         > 80% decrease in PCT                                      Discontinue / Do not initiate                                             antibiotics  Performed at [facility], [address], [place], [place] [address]   Magnesium      Status: None   Collection Time: 07/03/24  4:13 AM  Result Value Ref Range   Magnesium  1.8 1.7 - 2.4 mg/dL    Comment: Performed at [facility], [address], [place], [place] [address]  C-reactive protein     Status: Abnormal   Collection Time: 07/03/24  4:13 AM  Result Value Ref Range   CRP 3.0 (H) <1.0 mg/dL    Comment: Performed at [facility], [address], [place], [place] [address]  Comprehensive metabolic panel with GFR     Status: Abnormal   Collection Time: 07/03/24  4:13 AM  Result Value Ref Range   Sodium 142 135 - 145 mmol/L   Potassium 3.7 3.5 - 5.1 mmol/L   Chloride 105 98 - 111 mmol/L   CO2 28 22 - 32 mmol/L   Glucose, Bld 94 70 - 99 mg/dL    Comment: Glucose reference range applies only to samples taken after fasting for at least 8 hours.   BUN 14 8 - 23 mg/dL   Creatinine, Ser 8.56 (H) 0.44 - 1.00 mg/dL   Calcium  8.4 (L) 8.9 - 10.3 mg/dL   Total Protein 5.3 (L) 6.5 - 8.1 g/dL   Albumin  2.4 (L) 3.5 - 5.0 g/dL   AST 15 15 - 41 U/L   ALT <5 0 - 44 U/L   Alkaline Phosphatase 63 38 - 126 U/L   Total Bilirubin 0.5 0.0 - 1.2 mg/dL   GFR, Estimated  41 (L) >60 mL/min    Comment: (NOTE) Calculated using the CKD-EPI Creatinine Equation (2021)    Anion gap 9 5 - 15    Comment: Performed at [facility], [address] [place], [place] [address]  CBC with Differential/Platelet     Status: Abnormal   Collection Time: 07/03/24  4:13 AM  Result Value Ref Range   WBC 4.3 4.0 - 10.5 K/uL   RBC 3.03 (L) 3.87 - 5.11 MIL/uL   Hemoglobin 9.3 (L) 12.0 - 15.0 g/dL   HCT 70.9 (L) 63.9 - 53.9 %   MCV 95.7 80.0 - 100.0 fL   MCH 30.7 26.0 - 34.0 pg   MCHC 32.1 30.0 - 36.0 g/dL   RDW 84.2 (H) 88.4 -  15.5 %   Platelets 166 150 - 400 K/uL   nRBC 0.0 0.0 - 0.2 %   Neutrophils Relative % 61 %   Neutro Abs 2.7 1.7 - 7.7 K/uL   Lymphocytes Relative 19 %   Lymphs Abs 0.8 0.7 - 4.0 K/uL   Monocytes Relative 9 %   Monocytes Absolute 0.4 0.1 - 1.0 K/uL   Eosinophils Relative 10 %   Eosinophils Absolute 0.4 0.0 - 0.5 K/uL   Basophils Relative 1 %   Basophils Absolute 0.0 0.0 - 0.1 K/uL   Immature Granulocytes 0 %   Abs Immature Granulocytes 0.01 0.00 - 0.07 K/uL    Comment: Performed at [facility], [address], [place], [place] [address]  Glucose, capillary     Status: Abnormal   Collection Time: 07/03/24  7:36 AM  Result Value Ref Range   Glucose-Capillary 100 (H) 70 - 99 mg/dL    Comment: Glucose reference range applies only to samples taken after fasting for at least 8 hours.    Imaging / Studies: CT CYSTOGRAM PELVIS Result Date: 07/03/2024 EXAM: CT Cystogram 07/03/2024 12:34:33 AM TECHNIQUE: Contrast - No IV contrast was given, no oral contrast was given, 50 mL of iohexol  (OMNIPAQUE ) 300 MG/ML solution was instilled retrograde into the bladder via an indwelling foley catheter. Noncontrast phase - pelvis contrast placed in bladder. Reconstructions - coronal and sagittal planes of delayed phase. This exam was performed according to departmental dose-optimization program, which includes automated exposure control, adjustment of the mA and/or kV according to patient size and/or use of iterative reconstruction technique. FLUOROSCOPY DOSE AND TYPE: DLP mGycm COMPARISON: CT abdomen/pelvis dated 06/30/2024. CLINICAL HISTORY: Diverticulitis, complication suspected; UTI, recurrent/complicated (Female). FINDINGS: Lack of intravenous contrast compromises evaluation of solid organs and vasculature. GENITOURINARY: Indwelling foley catheter with contrast and nondependent gas. 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 No reflux of contrast into the distal ureters. The bladder is otherwise unremarkable without filling defect or extravasation of contrast. GASTROINTESTINAL: Status post sigmoid resection with suture line in the left pelvis (image 48). Visualized bowel is otherwise unremarkable. VASCULAR AND LYMPHATICS: No enlarged pelvic lymph nodes by CT size criteria. MSK: Bilateral hip arthroplasties with streak artifact overlying the pelvis. No concerning bony lesion identified. OTHER: In the pelvis, there is no extraluminal air. No extraluminal fluid. IMPRESSION: 1. 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 2. Status post sigmoid resection with suture line adjacent to the bladder dome. Electronically signed by: Pinkie Pebbles MD 07/03/2024 12:43 AM EST RP  Workstation: [id number]   CT ABDOMEN PELVIS WO CONTRAST Result Date: 06/30/2024 EXAM: CT ABDOMEN AND PELVIS WITHOUT CONTRAST 06/30/2024 05:44:55 PM TECHNIQUE: CT of the abdomen and pelvis was performed without the administration of intravenous contrast. Multiplanar reformatted images are provided for review. Automated exposure control, iterative reconstruction, and/or weight-based adjustment of the mA/kV was utilized to reduce the radiation dose to as low as reasonably achievable. COMPARISON: CT chest 10/21/2023, CT abdomen and pelvis 03/15/2024. CLINICAL HISTORY: Vesicointestinal fistula; history of colovesical fistula. Concern for recurrence with recurrent and severe UTI. FINDINGS: LOWER CHEST: Stable lower parenchymal scarring at the inferior right middle lobe. LIVER: The liver is unremarkable. GALLBLADDER AND BILE DUCTS: Post cholecystectomy. No biliary ductal dilatation. SPLEEN: No acute abnormality. PANCREAS: No acute abnormality. ADRENAL GLANDS: No acute abnormality. KIDNEYS, URETERS AND  BLADDER: There is new mild hydroureter and hydronephrosis of the right collecting system. No obstructing lesion identified within the right ureter. The ureteral dilatation extends to the pelvic brim. There is mild increase in left ureteral diameter additionally. The bladder is mildly distended. Small bubble of gas within the bladder. No nephrolithiasis. No perinephric or periureteral stranding. GI AND BOWEL: Stomach demonstrates no acute abnormality. There is anastomosis in the sigmoid colon. There is no bowel obstruction. PERITONEUM AND RETROPERITONEUM: No ascites. No free air. VASCULATURE: Aorta is normal in caliber. LYMPH NODES: No lymphadenopathy. REPRODUCTIVE ORGANS: No acute abnormality. BONES AND SOFT TISSUES: Bilateral hip prosthetics. Several surgical clips in the right lower quadrant. No acute osseous abnormality. No focal soft tissue abnormality. IMPRESSION: 1. New mild right hydroureteronephrosis with ureteral dilatation extending to the pelvic brim, without an identified ureteral obstructing lesion. 2. Mild increase in left ureteral diameter. 3. Mild bladder distention with a small intraluminal gas focus. No fissure identified. Small volume gas potentially related to instrumentation/catheterization . Electronically signed by: Norleen Boxer MD 06/30/2024 06:29 PM EST RP Workstation: [id number]   DG Chest 2 View Result Date: 06/30/2024 EXAM: 2 VIEW(S) XRAY OF THE CHEST 06/30/2024 12:24:00 PM COMPARISON: Chest radiograph 05/29/2024. CLINICAL HISTORY: Hypoxia. FINDINGS: LINES, TUBES AND DEVICES: Right chest port-a-cath tip overlies the mid to lower SVC. LUNGS AND PLEURA: No focal pulmonary opacity. No pulmonary edema. No pleural effusion. No pneumothorax. HEART AND MEDIASTINUM: No acute abnormality of the cardiac silhouette. BONES AND SOFT TISSUES: Degenerative changes of the thoracic spine. No acute osseous abnormality. IMPRESSION: 1. No acute cardiopulmonary findings. Electronically signed by: Shahmeer  Lateef MD 06/30/2024 01:37 PM EST RP Workstation: [id number]    Medications / Allergies: per chart  Antibiotics: Anti-infectives (From admission, onward)    Start     Dose/Rate Route Frequency Ordered Stop   07/01/24 0300  ceFEPIme (MAXIPIME) 2 g in sodium chloride  0.9 % 100 mL IVPB        2 g 200 mL/hr over 30 Minutes Intravenous Every 12 hours 06/30/24 1616     06/30/24 1430  ceFEPIme (MAXIPIME) 2 g in sodium chloride  0.9 % 100 mL IVPB        2 g 200 mL/hr over 30 Minutes Intravenous  Once 06/30/24 1427 06/30/24 1811         Note: Portions of this report may have been transcribed using voice recognition software. Every effort was made to ensure accuracy; however, inadvertent computerized transcription errors may be present.   Any transcriptional errors that result from this process are unintentional.      07/03/2024  8:13 AM

## 2024-07-03 NOTE — Progress Notes (Signed)
Patient placed herself on home CPAP unit for the night  

## 2024-07-03 NOTE — TOC Initial Note (Signed)
 Transition of Care [facility]) - Initial/Assessment Note    Patient Details  Name: Valerie [name] MRN: [id number] Date of Birth: 09-13-1960  Transition of Care [facility]) CM/SW Contact:    Nola Devere Hands, RN Phone Number: 07/03/2024, 9:08 AM  Clinical Narrative:                 Patient is a 63 yr old female admitted with Sepsis secondary to UTI. Case Manager spoke with patient concerning recommendation for Home Health PT. Patient is active with [facility] she stated. CM left a voice message for Baker Browner, [facility] Liaison to advise of patient's admission. Patient has RW at home, no further DME needs identified. ICM will continue to follow.   Expected Discharge Plan: Home w Home Health Services Barriers to Discharge: Continued Medical Work up   Patient Goals and CMS Choice Patient states their goals for this hospitalization and ongoing recovery are:: return home   Choice offered to / list presented to : Patient      Expected Discharge Plan and Services   Discharge Planning Services: CM Consult Post Acute Care Choice: Home Health, Resumption of Svcs/PTA Provider Living arrangements for the past 2 months: Single Family Home                 DME Arranged: N/A DME Agency: NA       HH Arranged: PT HH Agency: [facility] (resumption of care) Date HH Agency Contacted: 07/03/24 Time HH Agency Contacted: [phone] Representative spoke with at [facility] Agency: left Message for Artasvia that patient is hospitalized  Prior Living Arrangements/Services Living arrangements for the past 2 months: Single Family Home Lives with:: Self Patient language and need for interpreter reviewed:: Yes Do you feel safe going back to the place where you live?: Yes      Need for Family Participation in Patient Care: Yes (Comment) Care giver support system in place?: Yes (comment) Current home services: DME (uses walker) Criminal Activity/Legal Involvement Pertinent to  Current Situation/Hospitalization: No - Comment as needed  Activities of Daily Living   ADL Screening (condition at time of admission) Independently performs ADLs?: No Does the patient have a NEW difficulty with bathing/dressing/toileting/self-feeding that is expected to last >3 days?: No Does the patient have a NEW difficulty with getting in/out of bed, walking, or climbing stairs that is expected to last >3 days?: Yes (Initiates electronic notice to provider for possible PT consult) Does the patient have a NEW difficulty with communication that is expected to last >3 days?: No Is the patient deaf or have difficulty hearing?: No Does the patient have difficulty seeing, even when wearing glasses/contacts?: No Does the patient have difficulty concentrating, remembering, or making decisions?: No  Permission Sought/Granted         Permission granted to share info w AGENCY: [facility]        Emotional Assessment   Attitude/Demeanor/Rapport: Gracious   Orientation: : Oriented to Self, Oriented to Place, Oriented to  Time, Oriented to Situation Alcohol / Substance Use: Not Applicable Psych Involvement: No (comment)  Admission diagnosis:  Weakness [R53.1] Acute cystitis with hematuria [N30.01] Sepsis secondary to UTI (HCC) [A41.9, N39.0] Sepsis without acute organ dysfunction, due to unspecified organism [facility]) [A41.9] Patient Active Problem List   Diagnosis Date Noted   Sepsis secondary to UTI (HCC) 06/30/2024   Sepsis (HCC) 05/29/2024   Acute postoperative anemia due to expected blood loss 01/04/2024   Periprosthetic fracture around internal prosthetic right knee joint 01/02/2024  Primary localized osteoarthritis of right knee 10/19/2023   Seizure disorder (HCC) 07/28/2023   Parkinson disease (HCC) 07/28/2023   Aortic atherosclerosis 07/28/2023   Primary osteoarthritis involving multiple joints 06/20/2023   Long term (current) use of systemic steroids 06/20/2023   High risk  medication use 05/12/2023   Vitamin D  deficiency 05/12/2023   Tremor 02/11/2023   Fall 02/11/2023   Candidal skin infection 12/04/2022   Urinary incontinence 12/04/2022   GERD (gastroesophageal reflux disease) 10/22/2022   Hyperlipidemia associated with type 2 diabetes mellitus (HCC) 10/22/2022   Hyperparathyroidism 12/03/2020   Multiple thyroid  nodules 12/03/2020   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   Anemia, chronic disease 07/18/2020   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 07/18/2020   CAD (coronary artery disease) 06/04/2020   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 [place] [address] Phone: [phone] Fax: [phone]  [facility]) - [place], [place] - [address] [facility] [address] [place] [address] [place]  [address] Phone: [phone] Fax: [phone]     Social Drivers of Health (SDOH) Social History: SDOH Screenings   Food Insecurity: No Food Insecurity (06/30/2024)  Housing: Low Risk  (06/30/2024)  Transportation Needs: No Transportation Needs (06/30/2024)  Utilities: Not At Risk (06/30/2024)  Alcohol Screen: Low Risk  (12/04/2022)  Depression (PHQ2-9): Medium Risk (09/21/2023)  Financial Resource Strain: High Risk (01/11/2023)  Physical Activity: Inactive (09/21/2023)  Social Connections: Socially Isolated (10/21/2023)  Stress: Stress Concern Present (09/21/2023)   Tobacco Use: Medium Risk (06/30/2024)   SDOH Interventions:     Readmission Risk Interventions    05/31/2024    3:22 PM  Readmission Risk Prevention Plan  Transportation Screening Complete  Medication Review (RN Care Manager) Complete  PCP or Specialist appointment within 3-5 days of discharge Complete  HRI or Home Care Consult Complete  SW Recovery Care/Counseling Consult Complete  Palliative Care Screening Not Applicable  Skilled Nursing Facility Complete

## 2024-07-03 NOTE — Progress Notes (Addendum)
 Subjective: First time seeing Mrs. Forry. NAEON. Up in chair. Reviewed case and plan.   Objective: Vital signs in last 24 hours: Temp:  [98.2 F (36.8 C)-98.8 F (37.1 C)] 98.5 F (36.9 C) (11/10 1130) Pulse Rate:  [53-95] 65 (11/10 1130) Resp:  [9-42] 16 (11/10 0321) BP: (91-128)/(51-71) 104/54 (11/10 1130) SpO2:  [87 %-96 %] 94 % (11/10 0321)  Assessment/Plan: #Recurrent urinary tract infections #Right hydronephrosis with incomplete bladder emptying/retention #Acute urinary tract infection #Colovesical fistula?  CT cystogram with small defect towards the dome of the bladder concerning for fistulous connection though no extravasated contrast was noted.  Was able to review images with Dr. Sheldon who closed her bladder defect several years ago.  Collected recommended CT pelvis with contrast enema, again this did not reflect fistulous connection.  Patient clear to discharge once medically reasonable.  Will plan for outpatient cystoscopy  Initiate vaginal Estrace nightly for 2 weeks, then twice a week thereafter  After completion of primary UTI treatment, recommend placing patient on 100 mg of prophylactic trimethoprim  daily.  Intake/Output from previous day: 11/09 0701 - 11/10 0700 In: -  Out: 2600 [Urine:2600]  Intake/Output this shift: Total I/O In: 5 [I.V.:5] Out: -   Physical Exam:  General: Alert and oriented CV: No cyanosis Lungs: equal chest rise Abdomen: Soft, NTND, no rebound or guarding  Lab Results: Recent Labs    07/01/24 0605 07/02/24 0713 07/03/24 0413  HGB 9.8* 9.3* 9.3*  HCT 30.9* 29.5* 29.0*   BMET Recent Labs    07/02/24 0713 07/03/24 0413  NA 140 142  K 4.1 3.7  CL 103 105  CO2 26 28  GLUCOSE 99 94  BUN 15 14  CREATININE 1.50* 1.43*  CALCIUM  8.3* 8.4*  HGB 9.3* 9.3*  WBC 4.9 4.3     Studies/Results: CT PELVIS WO CONTRAST Result Date: 07/03/2024 CLINICAL DATA:  Colovesical fistula. EXAM: CT PELVIS WITHOUT CONTRAST  TECHNIQUE: Multidetector CT imaging of the pelvis was performed following the standard protocol without intravenous contrast. RADIATION DOSE REDUCTION: This exam was performed according to the departmental dose-optimization program which includes automated exposure control, adjustment of the mA and/or kV according to patient size and/or use of iterative reconstruction technique. COMPARISON:  CT cystogram earlier same day FINDINGS: Urinary Tract: Irregular bladder wall. Small gas bubble in the bladder lumen compatible with the instrumentation seen on the previous CT. Amorphous soft tissue again seen between the sigmoid colon, in the region of the anastomosis, and the dome of the bladder. There is some focal bladder wall thickening towards the bladder dome. A small focus of increased density is identified in this focal bladder wall thickening (see coronal 83/4) present before complete filling of the rectum and sigmoid colon with contrast material. The second set of images on this current study show better opacification of the rectum and sigmoid colon with no increase in high density material in this region of focal bladder wall thickening. This area corresponds to the tiny beak like projection of intraluminal contrast into the bladder wall seen on the earlier CT pelvis today of 12:17 a.m. (see coronal 77/4 of that study). There are adjacent tiny gas bubbles on that earlier study (images 73-76 of series 4). These bubbles track in the amorphous soft tissue between the colon and bladder dome up towards the sigmoid anastomosis and are considered suspicious for a colovesical fistula although a fistula is not well demonstrated on the current study. No definite migration of rectal contrast into the  bladder lumen to confirm the presence of a fistula on the current study. There is high density overlying the posterior aspect of the inferior bladder which is probably artifact from the bilateral hip replacement. Bowel: Rectum and  sigmoid colon are distended and opacified with contrast material. Rectal tube is visualized in situ. Vascular/Lymphatic: No pathologically enlarged lymph nodes. No significant vascular abnormality seen. Reproductive:  Status post hysterectomy. Other:  No intraperitoneal free fluid. Musculoskeletal: Status post bilateral hip replacement. IMPRESSION: 1. Amorphous soft tissue again seen between the sigmoid colon, in the region of the anastomosis, and the dome of the bladder. There is some focal bladder wall thickening associated. A small focus of increased density is identified in this focal bladder wall thickening present before complete filling of the rectum and sigmoid colon with contrast material on the current exam. The second set of images on this current study show better opacification of the rectum and sigmoid colon with no increase in high density material in this region of focal bladder wall thickening. However, this area corresponds to the tiny beak like projection of intraluminal contrast into the bladder wall seen on the earlier CT pelvis today of 12:17 AM. There are adjacent tiny gas bubbles on that earlier study which track in the amorphous soft tissue between the colon and bladder dome up towards the sigmoid anastomosis and are considered suspicious for a colovesical fistula although a fistula is not well demonstrated on the current study. No definite migration of rectal contrast into the bladder lumen to confirm the presence of a fistula on the current study. 2. Irregular bladder wall. Small gas bubble in the bladder lumen compatible with the instrumentation seen on the previous CT. Electronically Signed   By: Camellia Candle M.D.   On: 07/03/2024 12:28   CT CYSTOGRAM PELVIS Result Date: 07/03/2024 EXAM: CT Cystogram 07/03/2024 12:34:33 AM TECHNIQUE: Contrast - No IV contrast was given, no oral contrast was given, 50 mL of iohexol  (OMNIPAQUE ) 300 MG/ML solution was instilled retrograde into the  bladder via an indwelling foley catheter. Noncontrast phase - pelvis contrast placed in bladder. Reconstructions - coronal and sagittal planes of delayed phase. This exam was performed according to departmental dose-optimization program, which includes automated exposure control, adjustment of the mA and/or kV according to patient size and/or use of iterative reconstruction technique. FLUOROSCOPY DOSE AND TYPE: DLP mGycm COMPARISON: CT abdomen/pelvis dated 06/30/2024. CLINICAL HISTORY: Diverticulitis, complication suspected; UTI, recurrent/complicated (Female). FINDINGS: Lack of intravenous contrast compromises evaluation of solid organs and vasculature. GENITOURINARY: Indwelling foley catheter with contrast and nondependent gas. 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 No reflux of contrast into the distal ureters. The bladder is otherwise unremarkable without filling defect or extravasation of contrast. GASTROINTESTINAL: Status post sigmoid resection with suture line in the left pelvis (image 48). Visualized bowel is otherwise unremarkable. VASCULAR AND LYMPHATICS: No enlarged pelvic lymph nodes by CT size criteria. MSK: Bilateral hip arthroplasties with streak artifact overlying the pelvis. No concerning bony lesion identified. OTHER: In the pelvis, there is no extraluminal air. No extraluminal fluid. IMPRESSION: 1. 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 2. Status post sigmoid resection with suture line adjacent to the bladder dome. Electronically signed by: Pinkie Pebbles MD 07/03/2024 12:43 AM EST RP Workstation: [id number]      LOS:  1 day   Ole Bourdon, NP [facility] Pager: [phone]  07/03/2024, 2:17 PM

## 2024-07-03 NOTE — Progress Notes (Signed)
 PROGRESS NOTE                                                                                                                                                                                                             Patient Demographics:    Valerie [name], is a 63 y.o. female, DOB - 09-Sep-1960, FMW:[id number]  Outpatient Primary MD for the patient is Arloa Jarvis, NP    LOS - 1  Admit date - 06/30/2024    Chief Complaint  Patient presents with   UTI       Brief Narrative (HPI from H&P)    63 y.o. 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   Patient reports history of recurrent UTI.  Also recently admitted 10/6-10/13 with sepsis secondary to UTI that was treated with 7 days of ceftriaxone .  Also treated for AKI which improved with fluids.  She went home now comes back with dysuria and low-grade fevers diagnosed with UTI, CT showing some right sided hydroureteronephrosis with ureteral dilation and was admitted to the hospital.   Subjective:   Patient in bed, appears comfortable, denies any headache, no fever, no chest pain or pressure, no shortness of breath , no abdominal pain. No focal weakness.  Dysuria much improved.   Assessment  & Plan :   Sepsis secondary to UTI - this is the second such episode in the last few weeks, failed outpatient Macrobid treatment, history of colovesical fistula which was repaired by general surgeon Dr. Sheldon and she is also followed by urologist Dr. Selma.  He is being treated with IV fluids, empiric IV antibiotics, inflammatory markers being followed, clinically doing better. Both  urology and general surgery informed.  CT shows some right sided hydroureteronephrosis with ureteral dilatation extending to the pelvic brim, seen by urology underwent cystogram, case discussed with Dr. Carolee, per general surgery nothing to offer per urology outpatient cystoscopy.  Follow final cultures, will be seen by urology later today, if stable likely discharge on 07/04/2024.  Hypotension.  IV fluid bolus and hydrate.  Monitor.   Hyperlipidemia  - Continue home rosuvastatin    GERD - Not  currently taking PPI   CKD 3B  -Serum creatinine of 1.56 today. Stable. Continue to monitor closely.     CAD, Chronically occluded right subclavian artery  - Continue home ASA, rosuvastatin    Seizure disorder - Continue home Lamictal , Keppra , gabapentin    Bipolar disorder, Anxiety, Dementia -  Continue home Lamictal , trazodone . Continue donepezil , Namenda . Replace home desvenlafaxine with formulary venlafaxine   Psoriatic arthritis  - Get Enbrel injections outpatient   Parkinson disease  - Continue home Sinemet    OSA - Continue CPAP   Neuropathy - Continue home gabapentin    Obesity BMI of 41, follow-up with PCP.   DM 2 - SSI  Lab Results  Component Value Date   HGBA1C 5.3 10/21/2023   CBG (last 3)  Recent Labs    07/02/24 1631 07/02/24 2039 07/03/24 0736  GLUCAP 112* 90 100*        Condition - Fair  Family Communication  : Called daughter Geofm [phone]  on 07/02/2024 at 7:45 AM and detailed message left  Code Status :  Full  Consults  : Urology, general surgery  PUD Prophylaxis :    Procedures  :     Cystogram.    1. 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 2. Status post sigmoid resection with suture line adjacent to the bladder dome  CT - 1. New mild right hydroureteronephrosis with ureteral dilatation extending to the pelvic brim, without an identified  ureteral obstructing lesion. 2. Mild increase in left ureteral diameter. 3. Mild bladder distention with a small intraluminal gas focus. No fissure identified. Small volume gas potentially related to instrumentation/catheterization .      Disposition Plan  :    Status is: Inpatient   DVT Prophylaxis  :    enoxaparin  (LOVENOX ) injection 40 mg Start: 06/30/24 1800    Lab Results  Component Value Date   PLT 166 07/03/2024    Diet :  Diet Order             Diet regular Room service appropriate? Yes; Fluid consistency: Thin  Diet effective now                    Inpatient Medications  Scheduled Meds:  aspirin  EC  81 mg Oral Daily   carbidopa -levodopa   1 tablet Oral BID   Chlorhexidine  Gluconate Cloth  6 each Topical Daily   donepezil   10 mg Oral Daily   enoxaparin  (LOVENOX ) injection  40 mg Subcutaneous Q24H   gabapentin   300 mg Oral q morning   And   gabapentin   600 mg Oral QHS   insulin  aspart  0-15 Units Subcutaneous TID WC   lamoTRIgine   150 mg Oral BID   levETIRAcetam   750 mg Oral BID   memantine   10 mg Oral BID   midodrine   10 mg Oral TID WC   phenazopyridine   100 mg Oral TID WC   rosuvastatin   10 mg Oral Daily   sodium chloride  flush  3 mL Intravenous Q12H   traZODone   200 mg Oral QHS   venlafaxine XR  75 mg Oral Q breakfast   Continuous Infusions:  ceFEPime (MAXIPIME) IV 2 g (07/03/24 0321)   PRN Meds:.acetaminophen  **OR** acetaminophen , baclofen , polyethylene glycol, sodium chloride  flush, traMADol   Antibiotics  :    Anti-infectives (From admission, onward)    Start     Dose/Rate Route Frequency Ordered Stop   07/01/24 0300  ceFEPIme (MAXIPIME) 2 g in sodium chloride  0.9 % 100 mL IVPB        2 g 200 mL/hr over 30 Minutes Intravenous Every 12 hours 06/30/24 1616     06/30/24 1430  ceFEPIme (MAXIPIME) 2 g in sodium chloride  0.9 % 100 mL IVPB        2 g 200 mL/hr over 30 Minutes Intravenous  Once 06/30/24 1427 06/30/24 1811          Objective:   Vitals:   07/03/24 0319 07/03/24 0320 07/03/24 0321 07/03/24 0731  BP:  (!) 111/51  (!) 91/52  Pulse: 61 (!) 59 62   Resp: 14 16 16    Temp:  98.6 F (37 C)  98.2 F (36.8 C)  TempSrc:  Oral  Oral  SpO2: 94% 96% 94%   Weight:      Height:        Wt Readings from Last 3 Encounters:  06/30/24 103 kg  05/30/24 103.1 kg  04/18/24 95.3 kg     Intake/Output Summary (Last 24 hours) at 07/03/2024 0802 Last data filed at 07/02/2024 2300 Gross per 24 hour  Intake --  Output 2600 ml  Net -2600 ml     Physical Exam  Awake Alert, No new F.N deficits, Normal affect [place].AT,PERRAL Supple Neck, No JVD,   Symmetrical Chest wall movement, Good air movement bilaterally, CTAB RRR,No Gallops,Rubs or new Murmurs,  +ve B.Sounds, Abd Soft, No tenderness,   No Cyanosis, Clubbing or edema        Data Review:    Recent Labs  Lab 06/30/24 1201 07/01/24 0605 07/02/24 0713 07/03/24 0413  WBC 15.0* 6.0 4.9 4.3  HGB 11.2* 9.8* 9.3* 9.3*  HCT 35.4* 30.9* 29.5* 29.0*  PLT 180 154 152 166  MCV 96.5 96.3 96.7 95.7  MCH 30.5 30.5 30.5 30.7  MCHC 31.6 31.7 31.5 32.1  RDW 15.9* 15.8* 15.9* 15.7*  LYMPHSABS 0.6*  --  0.9 0.8  MONOABS 0.5  --  0.5 0.4  EOSABS 0.5  --  0.4 0.4  BASOSABS 0.1  --  0.0 0.0    Recent Labs  Lab 06/30/24 1201 06/30/24 1217 06/30/24 1602 07/01/24 0605 07/02/24 0713 07/03/24 0413  NA 138  --   --  142 140 142  K 4.5  --   --  4.2 4.1 3.7  CL 100  --   --  104 103 105  CO2 26  --   --  29 26 28   ANIONGAP 12  --   --  9 11 9   GLUCOSE 121*  --   --  89 99 94  BUN 18  --   --  15 15 14   CREATININE 1.58*  --   --  1.56* 1.50* 1.43*  AST 20  --   --  18 16 15   ALT 14  --   --  5 <5 <5  ALKPHOS 88  --   --  67 61 63  BILITOT 0.4  --   --  0.7 0.4 0.5  ALBUMIN  3.3*  --   --  2.7* 2.4* 2.4*  CRP  --   --   --   --  5.3* 3.0*  PROCALCITON  --   --   --   --  <0.10 <0.10  LATICACIDVEN  --  1.3 1.6  --   --   --   INR  --   --   --  1.1  --   --  MG  --   --   --   --  1.8 1.8  CALCIUM  8.5*  --   --  8.5* 8.3* 8.4*      Recent Labs  Lab 06/30/24 1201 06/30/24 1217 06/30/24 1602 07/01/24 0605 07/02/24 0713 07/03/24 0413  CRP  --   --   --   --  5.3* 3.0*  PROCALCITON  --   --   --   --  <0.10 <0.10  LATICACIDVEN  --  1.3 1.6  --   --   --   INR  --   --   --  1.1  --   --   MG  --   --   --   --  1.8 1.8  CALCIUM  8.5*  --   --  8.5* 8.3* 8.4*    --------------------------------------------------------------------------------------------------------------- Lab Results  Component Value Date   CHOL 156 02/10/2023   HDL 48.30 02/10/2023   LDLCALC 75 02/10/2023   TRIG 162.0 (H) 02/10/2023   CHOLHDL 3 02/10/2023    Lab Results  Component Value Date   HGBA1C 5.3 10/21/2023   No results for input(s): TSH, T4TOTAL, FREET4, T3FREE, THYROIDAB in the last 72 hours. No results for input(s): VITAMINB12, FOLATE, FERRITIN, TIBC, IRON , RETICCTPCT in the last 72 hours. ------------------------------------------------------------------------------------------------------------------ Cardiac Enzymes No results for input(s): CKMB, TROPONINI, MYOGLOBIN in the last 168 hours.  Invalid input(s): CK  Micro Results Recent Results (from the past 240 hours)  Urine Culture     Status: Abnormal   Collection Time: 06/30/24 12:01 PM   Specimen: Urine, Random  Result Value Ref Range Status   Specimen Description URINE, RANDOM  Final   Special Requests   Final    NONE Reflexed from [id number] Performed at [facility], [address], [place], [place] [address]    Culture 30,000 COLONIES/mL ESCHERICHIA COLI (A)  Final   Report Status 07/03/2024 FINAL  Final   Organism ID, Bacteria ESCHERICHIA COLI (A)  Final      Susceptibility   Escherichia coli - MIC*    AMPICILLIN  >=32 RESISTANT Resistant     CEFAZOLIN  (URINE) Value in next row Resistant      >=32 RESISTANTThis is a modified FDA-approved test  that has been validated and its performance characteristics determined by the reporting laboratory.  This laboratory is certified under the Clinical Laboratory Improvement Amendments CLIA as qualified to perform high complexity clinical laboratory testing.    CEFEPIME Value in next row Sensitive      >=32 RESISTANTThis is a modified FDA-approved test that has been validated and its performance characteristics determined by the reporting laboratory.  This laboratory is certified under the Clinical Laboratory Improvement Amendments CLIA as qualified to perform high complexity clinical laboratory testing.    ERTAPENEM Value in next row Sensitive      >=32 RESISTANTThis is a modified FDA-approved test that has been validated and its performance characteristics determined by the reporting laboratory.  This laboratory is certified under the Clinical Laboratory Improvement Amendments CLIA as qualified to perform high complexity clinical laboratory testing.    CEFTRIAXONE  Value in next row Sensitive      >=32 RESISTANTThis is a modified FDA-approved test that has been validated and its performance characteristics determined by the reporting laboratory.  This laboratory is certified under the Clinical Laboratory Improvement Amendments CLIA as qualified to perform high complexity clinical laboratory testing.    CIPROFLOXACIN Value in next row Resistant      >=32  RESISTANTThis is a modified FDA-approved test that has been validated and its performance characteristics determined by the reporting laboratory.  This laboratory is certified under the Clinical Laboratory Improvement Amendments CLIA as qualified to perform high complexity clinical laboratory testing.    GENTAMICIN Value in next row Sensitive      >=32 RESISTANTThis is a modified FDA-approved test that has been validated and its performance characteristics determined by the reporting laboratory.  This laboratory is certified under the Clinical Laboratory  Improvement Amendments CLIA as qualified to perform high complexity clinical laboratory testing.    NITROFURANTOIN Value in next row Sensitive      >=32 RESISTANTThis is a modified FDA-approved test that has been validated and its performance characteristics determined by the reporting laboratory.  This laboratory is certified under the Clinical Laboratory Improvement Amendments CLIA as qualified to perform high complexity clinical laboratory testing.    TRIMETH /SULFA  Value in next row Sensitive      >=32 RESISTANTThis is a modified FDA-approved test that has been validated and its performance characteristics determined by the reporting laboratory.  This laboratory is certified under the Clinical Laboratory Improvement Amendments CLIA as qualified to perform high complexity clinical laboratory testing.    AMPICILLIN /SULBACTAM Value in next row Intermediate      >=32 RESISTANTThis is a modified FDA-approved test that has been validated and its performance characteristics determined by the reporting laboratory.  This laboratory is certified under the Clinical Laboratory Improvement Amendments CLIA as qualified to perform high complexity clinical laboratory testing.    PIP/TAZO Value in next row Sensitive      <=4 SENSITIVEThis is a modified FDA-approved test that has been validated and its performance characteristics determined by the reporting laboratory.  This laboratory is certified under the Clinical Laboratory Improvement Amendments CLIA as qualified to perform high complexity clinical laboratory testing.    MEROPENEM Value in next row Sensitive      <=4 SENSITIVEThis is a modified FDA-approved test that has been validated and its performance characteristics determined by the reporting laboratory.  This laboratory is certified under the Clinical Laboratory Improvement Amendments CLIA as qualified to perform high complexity clinical laboratory testing.    * 30,000 COLONIES/mL ESCHERICHIA COLI  Culture,  blood (x 2)     Status: None (Preliminary result)   Collection Time: 06/30/24  3:20 PM   Specimen: BLOOD RIGHT ARM  Result Value Ref Range Status   Specimen Description BLOOD RIGHT ARM  Final   Special Requests   Final    BOTTLES DRAWN AEROBIC AND ANAEROBIC Blood Culture adequate volume   Culture   Final    NO GROWTH 3 DAYS Performed at [facility], [address] [place], [place] [address]    Report Status PENDING  Incomplete  Culture, blood (x 2)     Status: None (Preliminary result)   Collection Time: 06/30/24  3:38 PM   Specimen: BLOOD LEFT FOREARM  Result Value Ref Range Status   Specimen Description BLOOD LEFT FOREARM  Final   Special Requests   Final    BOTTLES DRAWN AEROBIC AND ANAEROBIC Blood Culture adequate volume   Culture   Final    NO GROWTH 3 DAYS Performed at [facility], [address], [place], [place] [address]    Report Status PENDING  Incomplete    Radiology Report CT CYSTOGRAM PELVIS Result Date: 07/03/2024 EXAM: CT Cystogram 07/03/2024 12:34:33 AM TECHNIQUE: Contrast - No IV contrast was given, no oral contrast was given,  50 mL of iohexol  (OMNIPAQUE ) 300 MG/ML solution was instilled retrograde into the bladder via an indwelling foley catheter. Noncontrast phase - pelvis contrast placed in bladder. Reconstructions - coronal and sagittal planes of delayed phase. This exam was performed according to departmental dose-optimization program, which includes automated exposure control, adjustment of the mA and/or kV according to patient size and/or use of iterative reconstruction technique. FLUOROSCOPY DOSE AND TYPE: DLP mGycm COMPARISON: CT abdomen/pelvis dated 06/30/2024. CLINICAL HISTORY: Diverticulitis, complication suspected; UTI, recurrent/complicated (Female). FINDINGS: Lack of intravenous contrast compromises evaluation of solid organs and vasculature. GENITOURINARY: Indwelling foley catheter with contrast and nondependent gas. Suspected colovesical  fistula (sagittal image 127) with associated bladder wall thickening along the dome, although contrast does not opacify the colon on the current CT to confirm patency. No reflux of contrast into the distal ureters. The bladder is otherwise unremarkable without filling defect or extravasation of contrast. GASTROINTESTINAL: Status post sigmoid resection with suture line in the left pelvis (image 48). Visualized bowel is otherwise unremarkable. VASCULAR AND LYMPHATICS: No enlarged pelvic lymph nodes by CT size criteria. MSK: Bilateral hip arthroplasties with streak artifact overlying the pelvis. No concerning bony lesion identified. OTHER: In the pelvis, there is no extraluminal air. No extraluminal fluid. IMPRESSION: 1. 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 2. Status post sigmoid resection with suture line adjacent to the bladder dome. Electronically signed by: Pinkie Pebbles MD 07/03/2024 12:43 AM EST RP Workstation: [id number]     Signature  -   Lavada Stank M.D on 07/03/2024 at 8:02 AM   -  To page go to [email or web address]

## 2024-07-03 NOTE — Plan of Care (Signed)

## 2024-07-04 ENCOUNTER — Other Ambulatory Visit (HOSPITAL_COMMUNITY): Payer: Self-pay

## 2024-07-04 DIAGNOSIS — A419 Sepsis, unspecified organism: Secondary | ICD-10-CM | POA: Diagnosis not present

## 2024-07-04 DIAGNOSIS — N390 Urinary tract infection, site not specified: Secondary | ICD-10-CM | POA: Diagnosis not present

## 2024-07-04 LAB — CBC WITH DIFFERENTIAL/PLATELET
Abs Immature Granulocytes: 0.02 K/uL (ref 0.00–0.07)
Basophils Absolute: 0.1 K/uL (ref 0.0–0.1)
Basophils Relative: 1 %
Eosinophils Absolute: 0.5 K/uL (ref 0.0–0.5)
Eosinophils Relative: 10 %
HCT: 30.7 % — ABNORMAL LOW (ref 36.0–46.0)
Hemoglobin: 9.6 g/dL — ABNORMAL LOW (ref 12.0–15.0)
Immature Granulocytes: 0 %
Lymphocytes Relative: 18 %
Lymphs Abs: 0.9 K/uL (ref 0.7–4.0)
MCH: 30.4 pg (ref 26.0–34.0)
MCHC: 31.3 g/dL (ref 30.0–36.0)
MCV: 97.2 fL (ref 80.0–100.0)
Monocytes Absolute: 0.5 K/uL (ref 0.1–1.0)
Monocytes Relative: 10 %
Neutro Abs: 3 K/uL (ref 1.7–7.7)
Neutrophils Relative %: 61 %
Platelets: 183 K/uL (ref 150–400)
RBC: 3.16 MIL/uL — ABNORMAL LOW (ref 3.87–5.11)
RDW: 15.6 % — ABNORMAL HIGH (ref 11.5–15.5)
WBC: 5 K/uL (ref 4.0–10.5)
nRBC: 0 % (ref 0.0–0.2)

## 2024-07-04 LAB — COMPREHENSIVE METABOLIC PANEL WITH GFR
ALT: 5 U/L (ref 0–44)
AST: 18 U/L (ref 15–41)
Albumin: 2.6 g/dL — ABNORMAL LOW (ref 3.5–5.0)
Alkaline Phosphatase: 68 U/L (ref 38–126)
Anion gap: 7 (ref 5–15)
BUN: 11 mg/dL (ref 8–23)
CO2: 30 mmol/L (ref 22–32)
Calcium: 8.7 mg/dL — ABNORMAL LOW (ref 8.9–10.3)
Chloride: 105 mmol/L (ref 98–111)
Creatinine, Ser: 1.41 mg/dL — ABNORMAL HIGH (ref 0.44–1.00)
GFR, Estimated: 42 mL/min — ABNORMAL LOW (ref >60)
Glucose, Bld: 83 mg/dL (ref 70–99)
Potassium: 3.9 mmol/L (ref 3.5–5.1)
Sodium: 142 mmol/L (ref 135–145)
Total Bilirubin: 0.2 mg/dL (ref 0.0–1.2)
Total Protein: 5.2 g/dL — ABNORMAL LOW (ref 6.5–8.1)

## 2024-07-04 LAB — C-REACTIVE PROTEIN: CRP: 1.7 mg/dL — ABNORMAL HIGH (ref <1.0)

## 2024-07-04 LAB — PROCALCITONIN: Procalcitonin: <0.1 ng/mL

## 2024-07-04 LAB — MAGNESIUM: Magnesium: 1.8 mg/dL (ref 1.7–2.4)

## 2024-07-04 LAB — GLUCOSE, CAPILLARY
Glucose-Capillary: 89 mg/dL (ref 70–99)
Glucose-Capillary: 123 mg/dL — ABNORMAL HIGH (ref 70–99)

## 2024-07-04 MED ORDER — HEPARIN SOD (PORK) LOCK FLUSH 100 UNIT/ML IV SOLN
500.0000 [IU] | INTRAVENOUS | Status: AC | PRN
  Administered 2024-07-04: 500 [IU]

## 2024-07-04 MED ORDER — PREDNISONE 20 MG PO TABS
20.0000 mg | ORAL_TABLET | Freq: Every day | ORAL | Status: DC
  Administered 2024-07-04: 20 mg via ORAL
  Filled 2024-07-04: qty 1

## 2024-07-04 MED ORDER — SULFAMETHOXAZOLE-TRIMETHOPRIM 400-80 MG PO TABS
1.0000 | ORAL_TABLET | Freq: Two times a day (BID) | ORAL | 0 refills | Status: DC
  Filled 2024-07-04: qty 20, 10d supply, fill #0

## 2024-07-04 MED ORDER — ONDANSETRON HCL 4 MG/2ML IJ SOLN
4.0000 mg | Freq: Four times a day (QID) | INTRAMUSCULAR | Status: DC | PRN
  Administered 2024-07-04: 4 mg via INTRAVENOUS
  Filled 2024-07-04: qty 2

## 2024-07-04 MED ORDER — MIDODRINE HCL 10 MG PO TABS
5.0000 mg | ORAL_TABLET | Freq: Two times a day (BID) | ORAL | 0 refills | Status: DC
  Filled 2024-07-04: qty 10, 10d supply, fill #0

## 2024-07-04 MED ORDER — SULFAMETHOXAZOLE-TRIMETHOPRIM 800-160 MG PO TABS
1.0000 | ORAL_TABLET | Freq: Two times a day (BID) | ORAL | 0 refills | Status: AC
  Filled 2024-07-04: qty 12, 6d supply, fill #0

## 2024-07-04 MED ORDER — CEPHALEXIN 500 MG PO CAPS
500.0000 mg | ORAL_CAPSULE | Freq: Three times a day (TID) | ORAL | 0 refills | Status: DC
  Filled 2024-07-04: qty 15, 5d supply, fill #0

## 2024-07-04 MED ORDER — CEPHALEXIN 500 MG PO CAPS
ORAL_CAPSULE | ORAL | 0 refills | Status: DC
  Filled 2024-07-04: qty 35, 15d supply, fill #0

## 2024-07-04 MED ORDER — LACTATED RINGERS IV BOLUS
500.0000 mL | Freq: Once | INTRAVENOUS | Status: AC
  Administered 2024-07-04: 500 mL via INTRAVENOUS

## 2024-07-04 MED ORDER — LACTATED RINGERS IV SOLN: INTRAVENOUS | Status: DC

## 2024-07-04 NOTE — Progress Notes (Signed)
 CT scan of was done with rectal contrast.  Very challenging to see since bilateral hip replacements have a lot of haziness in the region.  Staple near nearby.  However there is no major bladder leak to my view.  No increased gas.  No definite intraluminal staining.  Just thickening near the bladder.  I am skeptical of any major colovesical fistula.  She did have a delayed leak so it is not going to look normal.  I would hedged with some chronic antibiotics and monitor.  Hopefully this will calm down its own.  Agree with urology with antibiotic regimen.  Consider cystoscopy to more aggressively evaluate if persistent.  Solid diet as tolerated.  I would hold off on any surgical intervention at this time.  Surgery will follow peripherally.

## 2024-07-04 NOTE — Discharge Instructions (Addendum)
 Follow with Primary MD Arloa Jarvis, NP in 7 days   Get CBC, CMP, Magnesium , 2 view Chest X ray -  checked next visit with your primary MD   Activity: As tolerated with Full fall precautions use walker/cane & assistance as needed  Disposition Home    Diet: Heart Healthy  Low Carb  Special Instructions: If you have smoked or chewed Tobacco  in the last 2 yrs please stop smoking, stop any regular Alcohol  and or any Recreational drug use.  On your next visit with your primary care physician please Get Medicines reviewed and adjusted.  Please request your Prim.MD to go over all Hospital Tests and Procedure/Radiological results at the follow up, please get all Hospital records sent to your Prim MD by signing hospital release before you go home.  If you experience worsening of your admission symptoms, develop shortness of breath, life threatening emergency, suicidal or homicidal thoughts you must seek medical attention immediately by calling 911 or calling your MD immediately  if symptoms less severe.  You Must read complete instructions/literature along with all the possible adverse reactions/side effects for all the Medicines you take and that have been prescribed to you. Take any new Medicines after you have completely understood and accpet all the possible adverse reactions/side effects.   Do not drive when taking Pain medications.  Do not take more than prescribed Pain, Sleep and Anxiety Medications  Wear Seat belts while driving.

## 2024-07-04 NOTE — TOC Transition Note (Signed)
 Transition of Care [facility]) - Discharge Note   Patient Details  Name: Valerie [name] MRN: [id number] Date of Birth: November 01, 1960  [facility] CM/SW Contact:  Marval Gell, RN Phone Number: 07/04/2024, 9:14 AM   Clinical Narrative:     Notified [facility] that patient will be discharged today   Final next level of care: Home w Home Health Services Barriers to Discharge: No Barriers Identified   Patient Goals and CMS Choice Patient states their goals for this hospitalization and ongoing recovery are:: return home   Choice offered to / list presented to : Patient      Discharge Placement                       Discharge Plan and Services Additional resources added to the After Visit Summary for     Discharge Planning Services: CM Consult Post Acute Care Choice: Home Health, Resumption of Svcs/PTA Provider          DME Arranged: N/A DME Agency: NA       HH Arranged: PT HH Agency: [facility] ([facility]) Date HH Agency Contacted: 07/04/24 Time HH Agency Contacted: [phone] Representative spoke with at [facility] Agency: [facility]  Social Drivers of Health (SDOH) Interventions SDOH Screenings   Food Insecurity: No Food Insecurity (06/30/2024)  Housing: Low Risk  (06/30/2024)  Transportation Needs: No Transportation Needs (06/30/2024)  Utilities: Not At Risk (06/30/2024)  Alcohol Screen: Low Risk  (12/04/2022)  Depression (PHQ2-9): Medium Risk (09/21/2023)  Financial Resource Strain: High Risk (01/11/2023)  Physical Activity: Inactive (09/21/2023)  Social Connections: Socially Isolated (10/21/2023)  Stress: Stress Concern Present (09/21/2023)  Tobacco Use: Medium Risk (06/30/2024)     Readmission Risk Interventions    05/31/2024    3:22 PM  Readmission Risk Prevention Plan  Transportation Screening Complete  Medication Review (RN Care Manager) Complete  PCP or Specialist appointment within 3-5 days of discharge Complete  HRI or Home Care Consult Complete  SW  Recovery Care/Counseling Consult Complete  Palliative Care Screening Not Applicable  Skilled Nursing Facility Complete

## 2024-07-04 NOTE — Plan of Care (Signed)
  Problem: Fluid Volume: Goal: Hemodynamic stability will improve Outcome: Progressing   Problem: Clinical Measurements: Goal: Diagnostic test results will improve Outcome: Progressing   Problem: Respiratory: Goal: Ability to maintain adequate ventilation will improve Outcome: Progressing   Problem: Education: Goal: Ability to describe self-care measures that may prevent or decrease complications (Diabetes Survival Skills Education) will improve Outcome: Progressing   Problem: Metabolic: Goal: Ability to maintain appropriate glucose levels will improve Outcome: Progressing

## 2024-07-04 NOTE — Discharge Summary (Addendum)
 Valerie [name] FMW:[id number] DOB: 1960-10-14 DOA: 06/30/2024  PCP: Arloa Jarvis, NP  Admit date: 06/30/2024  Discharge date: 07/04/2024  Admitted From: Home   Disposition:  Home   Recommendations for Outpatient Follow-up:   Follow up with PCP in 1-2 weeks  PCP Please obtain BMP/CBC, 2 view CXR in 1week,  (see Discharge instructions)   PCP Please follow up on the following pending results:    Home Health: PT   Equipment/Devices: None  Consultations: Urology Discharge Condition: Stable    CODE STATUS: Full    Diet Recommendation: Heart Healthy Low Carb    Chief Complaint  Patient presents with   UTI     Brief history of present illness from the day of admission and additional interim summary    63 y.o. 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   Patient reports history of recurrent UTI.  Also recently admitted 10/6-10/13 with sepsis secondary to UTI that was treated with 7 days of ceftriaxone .  Also treated for AKI which improved with fluids.  She went home now comes back with dysuria and low-grade fevers diagnosed with UTI, CT showing some right sided hydroureteronephrosis with ureteral dilation and was admitted to the hospital.                                                                   Hospital Course    Sepsis secondary to UTI - this is the second such episode in the last few weeks, failed outpatient Macrobid treatment, history of colovesical fistula which was repaired by general surgeon  Dr. Sheldon and she is also followed by urologist Dr. Selma.  She was hydrated, cultures noted, multiple CT scans of the abdomen pelvis were done in conjunction with urology and urology also consulted Dr. Sheldon, all studies were reviewed case was discussed with the urology team morning of 07/03/2024 and again on 07/04/2024, no confirmed colovesical fistula, will be discharged on antibiotics as below with outpatient follow-up with urology, PCP and ID.     Hypotension.  Resolved after IV fluids and low-dose midodrine , continue home dose steroids along with short midodrine  taper   Hyperlipidemia  - Continue home rosuvastatin    GERD - Not currently taking PPI   CKD 3B  - Serum creatinine close to baseline..     CAD, Chronically occluded right subclavian artery  - Continue home ASA, rosuvastatin    Seizure disorder - Continue home Lamictal , Keppra , gabapentin    Bipolar disorder, Anxiety, Dementia -  Continue home Lamictal , trazodone . Continue donepezil , Namenda . Replace home desvenlafaxine with formulary venlafaxine   Psoriatic arthritis  -  Get Enbrel injections outpatient   Parkinson disease  - Continue home Sinemet    OSA - Continue CPAP   Neuropathy - Continue home gabapentin    Obesity BMI of 41, follow-up with PCP.   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   CAD (coronary artery disease)   Anemia, chronic disease   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)   GERD (gastroesophageal reflux  disease)   Seizure disorder (HCC)   Parkinson disease (HCC)    Discharge instructions    Discharge Instructions     Discharge instructions   Complete by: As directed    Follow with Primary MD Arloa Jarvis, NP in 7 days   Get CBC, CMP, Magnesium , 2 view Chest X ray -  checked next visit with your primary MD   Activity: As tolerated with Full fall precautions use walker/cane & assistance as needed  Disposition Home    Diet: Heart Healthy  Low Carb  Special Instructions: If you have smoked or chewed Tobacco  in the last 2 yrs please stop smoking, stop any regular Alcohol  and or any Recreational drug use.  On your next visit with your primary care physician please Get Medicines reviewed and adjusted.  Please request your Prim.MD to go over all Hospital Tests and Procedure/Radiological results at the follow up, please get all Hospital records sent to your Prim MD by signing hospital release before you go home.  If you experience worsening of your admission symptoms, develop shortness of breath, life threatening emergency, suicidal or homicidal thoughts you must seek medical attention immediately by calling 911 or calling your MD immediately  if symptoms less severe.  You Must read complete instructions/literature along with all the possible adverse reactions/side effects for all the Medicines you take and that have been prescribed to you. Take any new Medicines after you have completely understood and accpet all the possible adverse reactions/side effects.   Do not drive when taking Pain medications.  Do not take more than prescribed Pain, Sleep and Anxiety Medications  Wear Seat belts while driving.       Discharge Medications   Allergies as of 07/04/2024       Reactions   Carbamazepine Other (See Comments)   Parkinsons like symptoms, tremors   Sertraline Hcl Other (See Comments)   Parkinsons like symptoms        Medication List     STOP taking these medications     nitrofurantoin (macrocrystal-monohydrate) 100 MG capsule Commonly known as: MACROBID   nitroGLYCERIN  0.4 MG SL tablet Commonly known as: NITROSTAT    sennosides-docusate sodium  8.6-50 MG tablet Commonly known as: SENOKOT-S   Zepbound 2.5 MG/0.5ML Pen Generic drug: tirzepatide       TAKE these medications    aspirin  EC 81 MG tablet Take 81 mg by mouth daily. Swallow whole.   carbidopa -levodopa  25-100 MG tablet Commonly known as: SINEMET  IR Take 1 tablet by mouth in the morning and at  bedtime.   desvenlafaxine 25 MG 24 hr tablet Commonly known as: PRISTIQ Take 25 mg by mouth daily. What changed: how much to take   donepezil  10 MG tablet Commonly known as: ARICEPT  Take 10 mg by mouth daily.   Enbrel 50 MG/ML injection Generic drug: etanercept Inject 50 mg into the skin once a week. Patient takes this medication on Mondays   FISH OIL PO Take 2 capsules by mouth daily.   fluconazole  150 MG tablet Commonly known as: DIFLUCAN  Take 150 mg by mouth as needed (Recurring UTI).   gabapentin  300 MG capsule Commonly known as: NEURONTIN  Take 300-600 mg by mouth See admin instructions. Take 300 mg in the morning and 600 mg at night   lamoTRIgine  150 MG tablet Commonly known as: LAMICTAL  Take 1 tablet by mouth 2 (two) times daily.   LEQEMBI IV Inject into the vein every 14 (fourteen) days.   levETIRAcetam  750 MG tablet Commonly known as: KEPPRA  Take 750 mg by mouth 2 (two) times daily.   levocetirizine 5 MG tablet Commonly known as: XYZAL  Take 1 tablet (5 mg total) by mouth every evening. What changed:  when to take this reasons to take this   meclizine  25 MG tablet Commonly known as: ANTIVERT  Take 25 mg by mouth 3 (three) times daily as needed for dizziness.   memantine  10 MG tablet Commonly known as: NAMENDA  Take 10 mg by mouth 2 (two) times daily.   midodrine  10 MG tablet Commonly known as: PROAMATINE  Take 0.5 tablets (5 mg total) by mouth 2 (two)  times daily with a meal.   multivitamin with minerals tablet Take 1 tablet by mouth daily.   omeprazole  40 MG capsule Commonly known as: PRILOSEC Take 1 capsule (40 mg total) by mouth daily.   ondansetron  8 MG disintegrating tablet Commonly known as: ZOFRAN -ODT Take 8 mg by mouth every 8 (eight) hours as needed for nausea or vomiting.   oxybutynin  5 MG tablet Commonly known as: DITROPAN  Take 5 mg by mouth 2 (two) times daily.   polyethylene glycol 17 g packet Commonly known as: MIRALAX  / GLYCOLAX  Take 17 g by mouth 2 (two) times daily. What changed:  when to take this reasons to take this   predniSONE  5 MG tablet Commonly known as: DELTASONE  Take 5 mg by mouth daily.   primidone  50 MG tablet Commonly known as: MYSOLINE  Take 100 mg by mouth 2 (two) times daily.   rizatriptan  10 MG tablet Commonly known as: MAXALT  Take 10 mg by mouth as needed for migraine. May repeat in 2 hours if needed   rosuvastatin  10 MG tablet Commonly known as: CRESTOR  Take 1 tablet (10 mg total) by mouth daily. What changed: when to take this   STOOL SOFTENER PO Take 1 tablet by mouth in the morning and at bedtime.   sulfamethoxazole -trimethoprim  800-160 MG tablet Commonly known as: BACTRIM  DS Take 1 tablet by mouth 2 (two) times daily for 6 days.   sulfamethoxazole -trimethoprim  400-80 MG tablet Commonly known as: Bactrim  Take 1 tablet by mouth 2 (two) times daily. Start taking on: July 11, 2024   tiZANidine  4 MG tablet Commonly known as: ZANAFLEX  Take 1 tablet (4 mg total) by mouth 3 (three) times daily. What changed:  when to take this reasons to take this   traZODone  100 MG tablet Commonly known as: DESYREL  Take 2 tablets (200 mg total) by mouth at bedtime.         Follow-up Information     [facility],  [facility]  Follow up.   Why: Someone from [facility] will contact you to arrange date and time to resume your Home Health  Therapy. Contact information: [address] [place] [address] [phone]         Arloa Jarvis, NP. Schedule an appointment as soon as possible for a visit in 1 week(s).   Specialty: Nurse Practitioner Contact information: [address] [place] [address] [phone]         Selma Donnice SAUNDERS, MD. Schedule an appointment as soon as possible for a visit in 1 week(s).   Specialty: Urology Contact information: [address] [place] [address] [phone]         Luiz Channel, MD. Schedule an appointment as soon as possible for a visit in 3 day(s).   Specialty: Infectious Diseases Why: Please make this appointment as soon as possible. Contact information: [address] [place] [address] [phone]                 Major procedures and Radiology Reports - PLEASE review detailed and final reports thoroughly  -      CT PELVIS WO CONTRAST Result Date: 07/03/2024 CLINICAL DATA:  Colovesical fistula. EXAM: CT PELVIS WITHOUT CONTRAST TECHNIQUE: Multidetector CT imaging of the pelvis was performed following the standard protocol without intravenous contrast. RADIATION DOSE REDUCTION: This exam was performed according to the departmental dose-optimization program which includes automated exposure control, adjustment of the mA and/or kV according to patient size and/or use of iterative reconstruction technique. COMPARISON:  CT cystogram earlier same day FINDINGS: Urinary Tract: Irregular bladder wall. Small gas bubble in the bladder lumen compatible with the instrumentation seen on the previous CT. Amorphous soft tissue again seen between the sigmoid colon, in the region of the anastomosis, and the dome of the bladder. There is some focal bladder wall thickening towards the bladder dome. A small focus of increased density is identified in this focal bladder wall thickening (see coronal 83/4) present before complete filling of  the rectum and sigmoid colon with contrast material. The second set of images on this current study show better opacification of the rectum and sigmoid colon with no increase in high density material in this region of focal bladder wall thickening. This area corresponds to the tiny beak like projection of intraluminal contrast into the bladder wall seen on the earlier CT pelvis today of 12:17 a.m. (see coronal 77/4 of that study). There are adjacent tiny gas bubbles on that earlier study (images 73-76 of series 4). These bubbles track in the amorphous soft tissue between the colon and bladder dome up towards the sigmoid anastomosis and are considered suspicious for a colovesical fistula although a fistula is not well demonstrated on the current study. No definite migration of rectal contrast into the bladder lumen to confirm the presence of a fistula on the current study. There is high density overlying the posterior aspect of the inferior bladder which is probably artifact from the bilateral hip replacement. Bowel: Rectum and sigmoid colon are distended and opacified with contrast material. Rectal tube is visualized in situ. Vascular/Lymphatic: No pathologically enlarged lymph nodes. No significant vascular abnormality seen. Reproductive:  Status post hysterectomy. Other:  No intraperitoneal free fluid. Musculoskeletal: Status post bilateral hip replacement. IMPRESSION: 1. Amorphous soft tissue again seen between the sigmoid colon, in the region of the anastomosis, and the dome of the bladder. There is some focal bladder wall  thickening associated. A small focus of increased density is identified in this focal bladder wall thickening present before complete filling of the rectum and sigmoid colon with contrast material on the current exam. The second set of images on this current study show better opacification of the rectum and sigmoid colon with no increase in high density material in this region of focal bladder  wall thickening. However, this area corresponds to the tiny beak like projection of intraluminal contrast into the bladder wall seen on the earlier CT pelvis today of 12:17 AM. There are adjacent tiny gas bubbles on that earlier study which track in the amorphous soft tissue between the colon and bladder dome up towards the sigmoid anastomosis and are considered suspicious for a colovesical fistula although a fistula is not well demonstrated on the current study. No definite migration of rectal contrast into the bladder lumen to confirm the presence of a fistula on the current study. 2. Irregular bladder wall. Small gas bubble in the bladder lumen compatible with the instrumentation seen on the previous CT. Electronically Signed   By: Camellia Candle M.D.   On: 07/03/2024 12:28   CT CYSTOGRAM PELVIS Result Date: 07/03/2024 EXAM: CT Cystogram 07/03/2024 12:34:33 AM TECHNIQUE: Contrast - No IV contrast was given, no oral contrast was given, 50 mL of iohexol  (OMNIPAQUE ) 300 MG/ML solution was instilled retrograde into the bladder via an indwelling foley catheter. Noncontrast phase - pelvis contrast placed in bladder. Reconstructions - coronal and sagittal planes of delayed phase. This exam was performed according to departmental dose-optimization program, which includes automated exposure control, adjustment of the mA and/or kV according to patient size and/or use of iterative reconstruction technique. FLUOROSCOPY DOSE AND TYPE: DLP mGycm COMPARISON: CT abdomen/pelvis dated 06/30/2024. CLINICAL HISTORY: Diverticulitis, complication suspected; UTI, recurrent/complicated (Female). FINDINGS: Lack of intravenous contrast compromises evaluation of solid organs and vasculature. GENITOURINARY: Indwelling foley catheter with contrast and nondependent gas. Suspected colovesical fistula (sagittal image 127) with associated bladder wall thickening along the dome, although contrast does not opacify the colon on the current CT to  confirm patency. No reflux of contrast into the distal ureters. The bladder is otherwise unremarkable without filling defect or extravasation of contrast. GASTROINTESTINAL: Status post sigmoid resection with suture line in the left pelvis (image 48). Visualized bowel is otherwise unremarkable. VASCULAR AND LYMPHATICS: No enlarged pelvic lymph nodes by CT size criteria. MSK: Bilateral hip arthroplasties with streak artifact overlying the pelvis. No concerning bony lesion identified. OTHER: In the pelvis, there is no extraluminal air. No extraluminal fluid. IMPRESSION: 1. 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 2. Status post sigmoid resection with suture line adjacent to the bladder dome. Electronically signed by: Pinkie Pebbles MD 07/03/2024 12:43 AM EST RP Workstation: [id number]   CT ABDOMEN PELVIS WO CONTRAST Result Date: 06/30/2024 EXAM: CT ABDOMEN AND PELVIS WITHOUT CONTRAST 06/30/2024 05:44:55 PM TECHNIQUE: CT of the abdomen and pelvis was performed without the administration of intravenous contrast. Multiplanar reformatted images are provided for review. Automated exposure control, iterative reconstruction, and/or weight-based adjustment of the mA/kV was utilized to reduce the radiation dose to as low as reasonably achievable. COMPARISON: CT chest 10/21/2023, CT abdomen and pelvis 03/15/2024. CLINICAL HISTORY: Vesicointestinal fistula; history of colovesical fistula. Concern for recurrence with recurrent and severe UTI. FINDINGS: LOWER CHEST: Stable lower parenchymal scarring at the inferior right middle lobe. LIVER: The liver is unremarkable. GALLBLADDER AND BILE DUCTS: Post cholecystectomy.  No biliary ductal dilatation. SPLEEN: No acute abnormality. PANCREAS: No acute abnormality. ADRENAL GLANDS: No acute abnormality. KIDNEYS, URETERS AND BLADDER: There is new mild hydroureter and  hydronephrosis of the right collecting system. No obstructing lesion identified within the right ureter. The ureteral dilatation extends to the pelvic brim. There is mild increase in left ureteral diameter additionally. The bladder is mildly distended. Small bubble of gas within the bladder. No nephrolithiasis. No perinephric or periureteral stranding. GI AND BOWEL: Stomach demonstrates no acute abnormality. There is anastomosis in the sigmoid colon. There is no bowel obstruction. PERITONEUM AND RETROPERITONEUM: No ascites. No free air. VASCULATURE: Aorta is normal in caliber. LYMPH NODES: No lymphadenopathy. REPRODUCTIVE ORGANS: No acute abnormality. BONES AND SOFT TISSUES: Bilateral hip prosthetics. Several surgical clips in the right lower quadrant. No acute osseous abnormality. No focal soft tissue abnormality. IMPRESSION: 1. New mild right hydroureteronephrosis with ureteral dilatation extending to the pelvic brim, without an identified ureteral obstructing lesion. 2. Mild increase in left ureteral diameter. 3. Mild bladder distention with a small intraluminal gas focus. No fissure identified. Small volume gas potentially related to instrumentation/catheterization . Electronically signed by: Norleen Boxer MD 06/30/2024 06:29 PM EST RP Workstation: [id number]   DG Chest 2 View Result Date: 06/30/2024 EXAM: 2 VIEW(S) XRAY OF THE CHEST 06/30/2024 12:24:00 PM COMPARISON: Chest radiograph 05/29/2024. CLINICAL HISTORY: Hypoxia. FINDINGS: LINES, TUBES AND DEVICES: Right chest port-a-cath tip overlies the mid to lower SVC. LUNGS AND PLEURA: No focal pulmonary opacity. No pulmonary edema. No pleural effusion. No pneumothorax. HEART AND MEDIASTINUM: No acute abnormality of the cardiac silhouette. BONES AND SOFT TISSUES: Degenerative changes of the thoracic spine. No acute osseous abnormality. IMPRESSION: 1. No acute cardiopulmonary findings. Electronically signed by: Harrietta Sherry MD 06/30/2024 01:37 PM EST RP  Workstation: [id number]    Micro Results    Recent Results (from the past 240 hours)  Urine Culture     Status: Abnormal   Collection Time: 06/30/24 12:01 PM   Specimen: Urine, Random  Result Value Ref Range Status   Specimen Description URINE, RANDOM  Final   Special Requests   Final    NONE Reflexed from [id number] Performed at [facility], [address], [place], [place] [address]    Culture 30,000 COLONIES/mL ESCHERICHIA COLI (A)  Final   Report Status 07/03/2024 FINAL  Final   Organism ID, Bacteria ESCHERICHIA COLI (A)  Final      Susceptibility   Escherichia coli - MIC*    AMPICILLIN  >=32 RESISTANT Resistant     CEFAZOLIN  (URINE) Value in next row Resistant      >=32 RESISTANTThis is a modified FDA-approved test that has been validated and its performance characteristics determined by the reporting laboratory.  This laboratory is certified under the Clinical Laboratory Improvement Amendments CLIA as qualified to perform high complexity clinical laboratory testing.    CEFEPIME Value in next row Sensitive      >=32 RESISTANTThis is a modified FDA-approved test that has been validated and its performance characteristics determined by the reporting laboratory.  This laboratory is certified under the Clinical Laboratory Improvement Amendments CLIA as qualified to perform high complexity clinical laboratory testing.    ERTAPENEM Value in next row Sensitive      >=32 RESISTANTThis is a modified FDA-approved test that has been validated and its performance characteristics determined by the reporting laboratory.  This laboratory is certified under the Clinical Laboratory Improvement Amendments CLIA as qualified to perform high complexity clinical laboratory testing.  CEFTRIAXONE  Value in next row Sensitive      >=32 RESISTANTThis is a modified FDA-approved test that has been validated and its performance characteristics determined by the reporting laboratory.  This laboratory is  certified under the Clinical Laboratory Improvement Amendments CLIA as qualified to perform high complexity clinical laboratory testing.    CIPROFLOXACIN Value in next row Resistant      >=32 RESISTANTThis is a modified FDA-approved test that has been validated and its performance characteristics determined by the reporting laboratory.  This laboratory is certified under the Clinical Laboratory Improvement Amendments CLIA as qualified to perform high complexity clinical laboratory testing.    GENTAMICIN Value in next row Sensitive      >=32 RESISTANTThis is a modified FDA-approved test that has been validated and its performance characteristics determined by the reporting laboratory.  This laboratory is certified under the Clinical Laboratory Improvement Amendments CLIA as qualified to perform high complexity clinical laboratory testing.    NITROFURANTOIN Value in next row Sensitive      >=32 RESISTANTThis is a modified FDA-approved test that has been validated and its performance characteristics determined by the reporting laboratory.  This laboratory is certified under the Clinical Laboratory Improvement Amendments CLIA as qualified to perform high complexity clinical laboratory testing.    TRIMETH /SULFA  Value in next row Sensitive      >=32 RESISTANTThis is a modified FDA-approved test that has been validated and its performance characteristics determined by the reporting laboratory.  This laboratory is certified under the Clinical Laboratory Improvement Amendments CLIA as qualified to perform high complexity clinical laboratory testing.    AMPICILLIN /SULBACTAM Value in next row Intermediate      >=32 RESISTANTThis is a modified FDA-approved test that has been validated and its performance characteristics determined by the reporting laboratory.  This laboratory is certified under the Clinical Laboratory Improvement Amendments CLIA as qualified to perform high complexity clinical laboratory testing.     PIP/TAZO Value in next row Sensitive      <=4 SENSITIVEThis is a modified FDA-approved test that has been validated and its performance characteristics determined by the reporting laboratory.  This laboratory is certified under the Clinical Laboratory Improvement Amendments CLIA as qualified to perform high complexity clinical laboratory testing.    MEROPENEM Value in next row Sensitive      <=4 SENSITIVEThis is a modified FDA-approved test that has been validated and its performance characteristics determined by the reporting laboratory.  This laboratory is certified under the Clinical Laboratory Improvement Amendments CLIA as qualified to perform high complexity clinical laboratory testing.    * 30,000 COLONIES/mL ESCHERICHIA COLI  Culture, blood (x 2)     Status: None (Preliminary result)   Collection Time: 06/30/24  3:20 PM   Specimen: BLOOD RIGHT ARM  Result Value Ref Range Status   Specimen Description BLOOD RIGHT ARM  Final   Special Requests   Final    BOTTLES DRAWN AEROBIC AND ANAEROBIC Blood Culture adequate volume   Culture   Final    NO GROWTH 3 DAYS Performed at [facility], [address], [place], [place] [address]    Report Status PENDING  Incomplete  Culture, blood (x 2)     Status: None (Preliminary result)   Collection Time: 06/30/24  3:38 PM   Specimen: BLOOD LEFT FOREARM  Result Value Ref Range Status   Specimen Description BLOOD LEFT FOREARM  Final   Special Requests   Final    BOTTLES DRAWN AEROBIC AND ANAEROBIC Blood  Culture adequate volume   Culture   Final    NO GROWTH 3 DAYS Performed at [facility], [address] [place], [place] [address]    Report Status PENDING  Incomplete    Today   Subjective    Valerie [name] today has no headache,no chest abdominal pain,no new weakness tingling or numbness, feels much better wants to go home today.    Objective   Blood pressure (!) 126/53, pulse 60, temperature 98.6 F (37 C), temperature  source Oral, resp. rate 16, height 5' 2 (1.575 m), weight 103 kg, SpO2 95%.   Intake/Output Summary (Last 24 hours) at 07/04/2024 0922 Last data filed at 07/04/2024 0541 Gross per 24 hour  Intake 353.69 ml  Output --  Net 353.69 ml    Exam  Awake Alert, No new F.N deficits,    [place].AT,PERRAL Supple Neck,   Symmetrical Chest wall movement, Good air movement bilaterally, CTAB RRR,No Gallops,   +ve B.Sounds, Abd Soft, Non tender,  No Cyanosis, Clubbing or edema    Data Review   Recent Labs  Lab 06/30/24 1201 07/01/24 0605 07/02/24 0713 07/03/24 0413 07/04/24 0216  WBC 15.0* 6.0 4.9 4.3 5.0  HGB 11.2* 9.8* 9.3* 9.3* 9.6*  HCT 35.4* 30.9* 29.5* 29.0* 30.7*  PLT 180 154 152 166 183  MCV 96.5 96.3 96.7 95.7 97.2  MCH 30.5 30.5 30.5 30.7 30.4  MCHC 31.6 31.7 31.5 32.1 31.3  RDW 15.9* 15.8* 15.9* 15.7* 15.6*  LYMPHSABS 0.6*  --  0.9 0.8 0.9  MONOABS 0.5  --  0.5 0.4 0.5  EOSABS 0.5  --  0.4 0.4 0.5  BASOSABS 0.1  --  0.0 0.0 0.1    Recent Labs  Lab 06/30/24 1201 06/30/24 1217 06/30/24 1602 07/01/24 0605 07/02/24 0713 07/03/24 0413 07/04/24 0216  NA 138  --   --  142 140 142 142  K 4.5  --   --  4.2 4.1 3.7 3.9  CL 100  --   --  104 103 105 105  CO2 26  --   --  29 26 28 30   ANIONGAP 12  --   --  9 11 9 7   GLUCOSE 121*  --   --  89 99 94 83  BUN 18  --   --  15 15 14 11   CREATININE 1.58*  --   --  1.56* 1.50* 1.43* 1.41*  AST 20  --   --  18 16 15 18   ALT 14  --   --  5 <5 <5 5  ALKPHOS 88  --   --  67 61 63 68  BILITOT 0.4  --   --  0.7 0.4 0.5 0.2  ALBUMIN  3.3*  --   --  2.7* 2.4* 2.4* 2.6*  CRP  --   --   --   --  5.3* 3.0* 1.7*  PROCALCITON  --   --   --   --  <0.10 <0.10 <0.10  LATICACIDVEN  --  1.3 1.6  --   --   --   --   INR  --   --   --  1.1  --   --   --   MG  --   --   --   --  1.8 1.8 1.8  CALCIUM  8.5*  --   --  8.5* 8.3* 8.4* 8.7*    Total Time in preparing paper work, data evaluation and todays exam -  35 minutes  Signature  -    Lavada Stank M.D on 07/04/2024 at 9:22 AM   -  To page go to [email or web address]

## 2024-07-04 NOTE — Progress Notes (Signed)
 Physical Therapy Treatment Patient Details Name: Valerie [name] MRN: [id number] DOB: [birth year] Today's Date: 07/04/2024   History of Present Illness The pt is a 63 yo female presenting 11/7 after a fall. Admitted for sepsis due to UTI, recent admission 10/6-10/13 for same. PMH significant for multiple medical comorbidities including: obesity (BMI 41), arthritis, anxiety, Bipolar 1 disorder, CKD stage IIIb, history of colovesicular fistula, dementia, depression, DMT2, HTN, hypothyroidism, Parkinson's/neuromuscular disorder, history of sleep apnea, and history of TIA/CVA.    PT Comments  Patient with recent nause and anti-nausea meds. Willing to ambulate because I need to. Patient performing bed mobility, transfers, and ambulation modified independent with RW. Ambulated 150 ft with standing rest x1 with sats >90% on RA. Patient agrees she is moving well enough to go home from a mobility perspective.     If plan is discharge home, recommend the following: A little help with bathing/dressing/bathroom;Assistance with cooking/housework;Assist for transportation;Help with stairs or ramp for entrance   Can travel by private vehicle        Equipment Recommendations  None recommended by PT    Recommendations for Other Services       Precautions / Restrictions Precautions Precautions: Fall Recall of Precautions/Restrictions: Intact Restrictions Weight Bearing Restrictions Per Provider Order: No     Mobility  Bed Mobility Overal bed mobility: Modified Independent             General bed mobility comments: out of and into bed with HOB slightly elevated    Transfers Overall transfer level: Modified independent Equipment used: Rolling walker (2 wheels) Transfers: Sit to/from Stand Sit to Stand: Modified independent (Device/Increase time)           General transfer comment: from EOB and toilet    Ambulation/Gait Ambulation/Gait assistance: Modified independent  (Device/Increase time) Gait Distance (Feet): 150 Feet Assistive device: Rolling walker (2 wheels) Gait Pattern/deviations: Step-through pattern, Decreased stride length, Trunk flexed Gait velocity: decreased     General Gait Details: pt with slightly slowed gait, steady with BUE support. VSS on RA.   Stairs Stairs:  (pt able to verbalize how she goes up/down single step with RW and has no concerns that she will be able to do so)           Wheelchair Mobility     Tilt Bed    Modified Rankin (Stroke Patients Only)       Balance Overall balance assessment: Needs assistance Sitting-balance support: No upper extremity supported Sitting balance-Leahy Scale: Good     Standing balance support: During functional activity, No upper extremity supported Standing balance-Leahy Scale: Fair Standing balance comment: BUE support for gait                            Communication Communication Communication: No apparent difficulties  Cognition Arousal: Alert Behavior During Therapy: WFL for tasks assessed/performed   PT - Cognitive impairments: No apparent impairments                         Following commands: Intact      Cueing Cueing Techniques: Verbal cues  Exercises      General Comments General comments (skin integrity, edema, etc.): sats 90-94% on RA      Pertinent Vitals/Pain Pain Assessment Pain Assessment: No/denies pain    Home Living  Prior Function            PT Goals (current goals can now be found in the care plan section) Acute Rehab PT Goals Patient Stated Goal: to return home Time For Goal Achievement: 07/16/24 Potential to Achieve Goals: Good Progress towards PT goals: Progressing toward goals    Frequency    Min 2X/week      PT Plan      Co-evaluation              AM-PAC PT 6 Clicks Mobility   Outcome Measure  Help needed turning from your back to your side  while in a flat bed without using bedrails?: None Help needed moving from lying on your back to sitting on the side of a flat bed without using bedrails?: None Help needed moving to and from a bed to a chair (including a wheelchair)?: None Help needed standing up from a chair using your arms (e.g., wheelchair or bedside chair)?: None Help needed to walk in hospital room?: None Help needed climbing 3-5 steps with a railing? : A Little 6 Click Score: 23    End of Session   Activity Tolerance: Patient tolerated treatment well Patient left: with call bell/phone within reach;in bed;with bed alarm set Nurse Communication: Mobility status;Other (comment) (ok for dc re: mobility) PT Visit Diagnosis: Unsteadiness on feet (R26.81);Other abnormalities of gait and mobility (R26.89);Muscle weakness (generalized) (M62.81)     Time: 8889-8871 PT Time Calculation (min) (ACUTE ONLY): 18 min  Charges:    $Gait Training: 8-22 mins PT General Charges $$ ACUTE PT VISIT: 1 Visit                      Macario RAMAN, PT Acute Rehabilitation Services  Office [phone]    Macario SHAUNNA Soja 07/04/2024, 11:35 AM

## 2024-07-05 LAB — CULTURE, BLOOD (ROUTINE X 2)
Culture: NO GROWTH
Culture: NO GROWTH
Special Requests: ADEQUATE
Special Requests: ADEQUATE

## 2024-07-07 ENCOUNTER — Ambulatory Visit: Admitting: Internal Medicine

## 2024-07-07 ENCOUNTER — Telehealth: Payer: Self-pay

## 2024-07-07 ENCOUNTER — Encounter: Payer: Self-pay | Admitting: Internal Medicine

## 2024-07-07 ENCOUNTER — Other Ambulatory Visit: Payer: Self-pay

## 2024-07-07 VITALS — BP 131/70 | HR 70 | Temp 98.2°F

## 2024-07-07 DIAGNOSIS — N390 Urinary tract infection, site not specified: Secondary | ICD-10-CM | POA: Diagnosis not present

## 2024-07-07 DIAGNOSIS — A419 Sepsis, unspecified organism: Secondary | ICD-10-CM

## 2024-07-07 DIAGNOSIS — N3000 Acute cystitis without hematuria: Secondary | ICD-10-CM

## 2024-07-07 DIAGNOSIS — N179 Acute kidney failure, unspecified: Secondary | ICD-10-CM | POA: Diagnosis not present

## 2024-07-07 DIAGNOSIS — N189 Chronic kidney disease, unspecified: Secondary | ICD-10-CM

## 2024-07-07 DIAGNOSIS — Z79899 Other long term (current) drug therapy: Secondary | ICD-10-CM

## 2024-07-07 MED ORDER — PREMARIN 0.625 MG/GM VA CREA: 1.0000 | TOPICAL_CREAM | VAGINAL | 12 refills | Status: AC

## 2024-07-07 MED ORDER — METHENAMINE HIPPURATE 1 G PO TABS: 1.0000 g | ORAL_TABLET | Freq: Two times a day (BID) | ORAL | 3 refills | Status: DC

## 2024-07-07 NOTE — Telephone Encounter (Signed)
 Per Dr. Luiz pt will need to applicator full- pharmacist states that full applicator will be 1gm. Gave okay for this dose.  No other questions at this time. Lorenda CHRISTELLA Code, RMA

## 2024-07-07 NOTE — Telephone Encounter (Signed)
 [facility] pharmacy called regarding conjugated estrogens (PREMARIN) vaginal cream prescription. Pharmacist is asking how many grams patient will need to apply. Cream comes with applicator and requires this in directions.  Requesting new script be sent in. Secure chat sent to provider. Lorenda CHRISTELLA Code, RMA

## 2024-07-07 NOTE — Progress Notes (Signed)
 Patient ID: Valerie [name], female   DOB: 14-Nov-1960, 63 y.o.   MRN: [id number]  HPI 63yo F with history of colovesicula fistula repaired by Dr Sheldon. And also followed by Dr Selma. She was recently hospitalized for sepsis due to UTI due to e.coli. Work up included urology and gen surgery to weign in and didn't confirm recurrence of colovesicula fistula. She was discharged on bactrim . She mentioned that she usually is asymptomatic but this past event she had intense burning urination, and easily became septic. She is feeling better since discharged. No issues with taking abtx. Denies diarrhea nor rash  Has upcoming appt with dr gay next Thursday. Has cystoscopy dec 18th   Outpatient Encounter Medications as of 07/07/2024  Medication Sig   aspirin  EC 81 MG tablet Take 81 mg by mouth daily. Swallow whole.   carbidopa -levodopa  (SINEMET  IR) 25-100 MG tablet Take 1 tablet by mouth in the morning and at bedtime.   desvenlafaxine (PRISTIQ) 25 MG 24 hr tablet Take 25 mg by mouth daily. (Patient taking differently: Take 50 mg by mouth daily.)   Docusate Calcium  (STOOL SOFTENER PO) Take 1 tablet by mouth in the morning and at bedtime.   donepezil  (ARICEPT ) 10 MG tablet Take 10 mg by mouth daily.   ENBREL 50 MG/ML injection Inject 50 mg into the skin once a week. Patient takes this medication on Mondays   fluconazole  (DIFLUCAN ) 150 MG tablet Take 150 mg by mouth as needed (Recurring UTI).   gabapentin  (NEURONTIN ) 300 MG capsule Take 300-600 mg by mouth See admin instructions. Take 300 mg in the morning and 600 mg at night   lamoTRIgine  (LAMICTAL ) 150 MG tablet Take 1 tablet by mouth 2 (two) times daily.   Lecanemab-irmb (LEQEMBI IV) Inject into the vein every 14 (fourteen) days.   levETIRAcetam  (KEPPRA ) 750 MG tablet Take 750 mg by mouth 2 (two) times daily.   levocetirizine (XYZAL ) 5 MG tablet Take 1 tablet (5 mg total) by mouth every evening. (Patient taking differently: Take 5 mg by mouth as needed  for allergies.)   meclizine  (ANTIVERT ) 25 MG tablet Take 25 mg by mouth 3 (three) times daily as needed for dizziness.   memantine  (NAMENDA ) 10 MG tablet Take 10 mg by mouth 2 (two) times daily.   midodrine  (PROAMATINE ) 10 MG tablet Take 0.5 tablets (5 mg total) by mouth 2 (two) times daily with a meal.   Multiple Vitamins-Minerals (MULTIVITAMIN WITH MINERALS) tablet Take 1 tablet by mouth daily.   Omega-3 Fatty Acids (FISH OIL PO) Take 2 capsules by mouth daily.   omeprazole  (PRILOSEC) 40 MG capsule Take 1 capsule (40 mg total) by mouth daily.   ondansetron  (ZOFRAN -ODT) 8 MG disintegrating tablet Take 8 mg by mouth every 8 (eight) hours as needed for nausea or vomiting.   oxybutynin  (DITROPAN ) 5 MG tablet Take 5 mg by mouth 2 (two) times daily.   polyethylene glycol (MIRALAX  / GLYCOLAX ) 17 g packet Take 17 g by mouth 2 (two) times daily. (Patient taking differently: Take 17 g by mouth daily as needed for mild constipation.)   predniSONE  (DELTASONE ) 5 MG tablet Take 5 mg by mouth daily.   primidone  (MYSOLINE ) 50 MG tablet Take 100 mg by mouth 2 (two) times daily.   rizatriptan  (MAXALT ) 10 MG tablet Take 10 mg by mouth as needed for migraine. May repeat in 2 hours if needed   rosuvastatin  (CRESTOR ) 10 MG tablet Take 1 tablet (10 mg total) by mouth daily. (Patient taking  differently: Take 10 mg by mouth at bedtime.)   sulfamethoxazole -trimethoprim  (BACTRIM  DS) 800-160 MG tablet Take 1 tablet by mouth 2 (two) times daily for 6 days.   [START ON 07/11/2024] sulfamethoxazole -trimethoprim  (BACTRIM ) 400-80 MG tablet Take 1 tablet by mouth 2 (two) times daily. (Start on 11/18)   tiZANidine  (ZANAFLEX ) 4 MG tablet Take 1 tablet (4 mg total) by mouth 3 (three) times daily. (Patient taking differently: Take 4 mg by mouth as needed for muscle spasms.)   traZODone  (DESYREL ) 100 MG tablet Take 2 tablets (200 mg total) by mouth at bedtime.   No facility-administered encounter medications on file as of 07/07/2024.      Patient Active Problem List   Diagnosis Date Noted   Sepsis secondary to UTI (HCC) 06/30/2024   Sepsis (HCC) 05/29/2024   Acute postoperative anemia due to expected blood loss 01/04/2024   Periprosthetic fracture around internal prosthetic right knee joint 01/02/2024   Primary localized osteoarthritis of right knee 10/19/2023   Seizure disorder (HCC) 07/28/2023   Parkinson disease (HCC) 07/28/2023   Aortic atherosclerosis 07/28/2023   Primary osteoarthritis involving multiple joints 06/20/2023   Long term (current) use of systemic steroids 06/20/2023   High risk medication use 05/12/2023   Vitamin D  deficiency 05/12/2023   Tremor 02/11/2023   Fall 02/11/2023   Candidal skin infection 12/04/2022   Urinary incontinence 12/04/2022   GERD (gastroesophageal reflux disease) 10/22/2022   Hyperlipidemia associated with type 2 diabetes mellitus (HCC) 10/22/2022   Hyperparathyroidism 12/03/2020   Multiple thyroid  nodules 12/03/2020   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   Anemia, chronic disease 07/18/2020   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 07/18/2020   CAD (coronary artery disease) 06/04/2020   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 05/20/2020   DM type 2 (diabetes mellitus, type 2) (HCC) 11/14/2019   Anxiety    Obesity, Class II, BMI 35-39.9    Chronic kidney disease, stage 3b (HCC) - baseline Scr 1.4    Psoriatic arthritis (HCC)    Tobacco abuse    OSA (obstructive sleep apnea)    Neuropathy    Bipolar disorder (HCC)    Osteoarthritis of right hip 11/05/2014   Essential hypertension 10/29/2014     Health Maintenance Due  Topic Date Due   COVID-19 Vaccine (1) Never done   Diabetic kidney evaluation - Urine ACR  Never done   DTaP/Tdap/Td (1 - Tdap) Never done   Pneumococcal Vaccine: 50+ Years (1 of 2 - PCV) Never done   Zoster Vaccines- Shingrix (1 of 2) Never done   FOOT EXAM  12/04/2023    OPHTHALMOLOGY EXAM  01/20/2024   Medicare Annual Wellness (AWV)  02/10/2024   Influenza Vaccine  03/24/2024   HEMOGLOBIN A1C  04/19/2024     Review of Systems 12 point ros is otherwise negative Physical Exam  BP 131/70   Pulse 70   Temp 98.2 F (36.8 C) (Oral)   SpO2 96%  Constitutional:  oriented to person, place, and time. appears well-developed and well-nourished. No distress.  HENT: /AT, PERRLA, no scleral icterus Mouth/Throat: Oropharynx is clear and moist. No oropharyngeal exudate.  Cardiovascular: Normal rate, regular rhythm and normal heart sounds. Exam reveals no gallop and no friction rub.  No murmur heard.  Pulmonary/Chest: Effort normal and breath sounds normal. No respiratory distress.  has no wheezes.  Abdominal: Soft. Bowel sounds are normal.  exhibits no distension. There is no tenderness.  Neurological: alert and oriented to  person, place, and time.  Skin: Skin is warm and dry. No rash noted. No erythema.  Psychiatric: a normal mood and affect.  behavior is normal.    CBC Lab Results  Component Value Date   WBC 5.0 07/04/2024   RBC 3.16 (L) 07/04/2024   HGB 9.6 (L) 07/04/2024   HCT 30.7 (L) 07/04/2024   PLT 183 07/04/2024   MCV 97.2 07/04/2024   MCH 30.4 07/04/2024   MCHC 31.3 07/04/2024   RDW 15.6 (H) 07/04/2024   LYMPHSABS 0.9 07/04/2024   MONOABS 0.5 07/04/2024   EOSABS 0.5 07/04/2024    BMET Lab Results  Component Value Date   NA 142 07/04/2024   K 3.9 07/04/2024   CL 105 07/04/2024   CO2 30 07/04/2024   GLUCOSE 83 07/04/2024   BUN 11 07/04/2024   CREATININE 1.41 (H) 07/04/2024   CALCIUM  8.7 (L) 07/04/2024   GFRNONAA 42 (L) 07/04/2024   GFRAA 38 (L) 05/15/2020   Escherichia coli      MIC    AMPICILLIN  >=32 RESIST... Resistant    AMPICILLIN /SULBACTAM 16 INTERMED... Intermediate    CEFAZOLIN  (URINE)  Resistant 1    CEFEPIME <=0.12 SENS... Sensitive    CEFTRIAXONE  <=0.25 SENS... Sensitive    CIPROFLOXACIN 1 RESISTANT Resistant     ERTAPENEM <=0.12 SENS... Sensitive    GENTAMICIN <=1 SENSITIVE Sensitive    MEROPENEM <=0.25 SENS... Sensitive    NITROFURANTOIN <=16 SENSIT... Sensitive    PIP/TAZO  Sensitive 2    TRIMETH /SULFA  <=20 SENSIT... Sensitive      Assessment and Plan Long term medication management = will check Cbc and cmp and mag today  Acute on chronic kidney disease= labs show slight increase from her baseline. Likely due to bactrim . Antiicpate to return back to baseline as she finishes her high dose in the next few days  Recurrent uti, hx of sepsis from uti= finish out course of bactrim  then Start with hipprex and continue vaginal estrogen  Hx of ecoli uti = currently still has bactrim  and nitrofurantoin as oral options based on sensitivities  Rtc in 4 wk  cc gay  I have personally spent 30 minutes involved in face-to-face and non-face-to-face activities for this patient on the day of the visit. Professional time spent includes the following activities: Preparing to see the patient (review of tests), Obtaining and/or reviewing separately obtained history (admission/discharge record), Performing a medically appropriate examination and/or evaluation , Ordering medications/tests/procedures, referring and communicating with other health care professionals, Documenting clinical information in the EMR, Independently interpreting results (not separately reported), Communicating results to the patient/family, Counseling and educating the patient/family

## 2024-07-08 LAB — COMPREHENSIVE METABOLIC PANEL WITH GFR
AG Ratio: 1.5 (calc) (ref 1.0–2.5)
ALT: 12 U/L (ref 6–29)
AST: 17 U/L (ref 10–35)
Albumin: 3.9 g/dL (ref 3.6–5.1)
Alkaline phosphatase (APISO): 72 U/L (ref 37–153)
BUN/Creatinine Ratio: 14 (calc) (ref 6–22)
BUN: 25 mg/dL (ref 7–25)
CO2: 28 mmol/L (ref 20–32)
Calcium: 8.5 mg/dL — ABNORMAL LOW (ref 8.6–10.4)
Chloride: 105 mmol/L (ref 98–110)
Creat: 1.83 mg/dL — ABNORMAL HIGH (ref 0.50–1.05)
Globulin: 2.6 g/dL (ref 1.9–3.7)
Glucose, Bld: 100 mg/dL — ABNORMAL HIGH (ref 65–99)
Potassium: 4.3 mmol/L (ref 3.5–5.3)
Sodium: 142 mmol/L (ref 135–146)
Total Bilirubin: 0.2 mg/dL (ref 0.2–1.2)
Total Protein: 6.5 g/dL (ref 6.1–8.1)
eGFR: 30 mL/min/1.73m2 — ABNORMAL LOW (ref >=60)

## 2024-07-08 LAB — CBC WITH DIFFERENTIAL/PLATELET
Absolute Lymphocytes: 970 {cells}/uL (ref 850–3900)
Absolute Monocytes: 670 {cells}/uL (ref 200–950)
Basophils Absolute: 50 {cells}/uL (ref 0–200)
Basophils Relative: 0.5 %
Eosinophils Absolute: 440 {cells}/uL (ref 15–500)
Eosinophils Relative: 4.4 %
HCT: 31.8 % — ABNORMAL LOW (ref 35.0–45.0)
Hemoglobin: 10.1 g/dL — ABNORMAL LOW (ref 11.7–15.5)
MCH: 30.4 pg (ref 27.0–33.0)
MCHC: 31.8 g/dL — ABNORMAL LOW (ref 32.0–36.0)
MCV: 95.8 fL (ref 80.0–100.0)
MPV: 9.8 fL (ref 7.5–12.5)
Monocytes Relative: 6.7 %
Neutro Abs: 7870 {cells}/uL — ABNORMAL HIGH (ref 1500–7800)
Neutrophils Relative %: 78.7 %
Platelets: 221 Thousand/uL (ref 140–400)
RBC: 3.32 Million/uL — ABNORMAL LOW (ref 3.80–5.10)
RDW: 15 % (ref 11.0–15.0)
Total Lymphocyte: 9.7 %
WBC: 10 Thousand/uL (ref 3.8–10.8)

## 2024-07-08 LAB — TEST AUTHORIZATION: TEST NAME:: 622

## 2024-07-08 LAB — MAGNESIUM: Magnesium: 1.9 mg/dL (ref 1.5–2.5)

## 2024-08-04 ENCOUNTER — Other Ambulatory Visit: Payer: Self-pay

## 2024-08-04 ENCOUNTER — Encounter: Payer: Self-pay | Admitting: Internal Medicine

## 2024-08-04 ENCOUNTER — Ambulatory Visit (INDEPENDENT_AMBULATORY_CARE_PROVIDER_SITE_OTHER): Admitting: Internal Medicine

## 2024-08-04 VITALS — BP 99/57 | HR 89 | Temp 97.8°F | Ht 62.0 in | Wt 212.0 lb

## 2024-08-04 DIAGNOSIS — N3000 Acute cystitis without hematuria: Secondary | ICD-10-CM

## 2024-08-04 MED ORDER — METHENAMINE HIPPURATE 1 G PO TABS: 1.0000 g | ORAL_TABLET | Freq: Two times a day (BID) | ORAL | 3 refills | Status: AC

## 2024-08-04 MED ORDER — PHENAZOPYRIDINE HCL 100 MG PO TABS: 100.0000 mg | ORAL_TABLET | Freq: Three times a day (TID) | ORAL | 0 refills | Status: DC | PRN

## 2024-08-04 MED ORDER — PHENAZOPYRIDINE HCL 100 MG PO TABS: 100.0000 mg | ORAL_TABLET | Freq: Three times a day (TID) | ORAL | 0 refills | Status: AC | PRN

## 2024-08-04 MED ORDER — NITROFURANTOIN MONOHYD MACRO 100 MG PO CAPS: 100.0000 mg | ORAL_CAPSULE | Freq: Two times a day (BID) | ORAL | 0 refills | Status: DC

## 2024-08-04 NOTE — Progress Notes (Unsigned)
 Patient ID: Valerie [name], female   DOB: 12-23-1960, 63 y.o.   MRN: [id number]  HPI   Outpatient Encounter Medications as of 08/04/2024  Medication Sig   aspirin  EC 81 MG tablet Take 81 mg by mouth daily. Swallow whole.   carbidopa -levodopa  (SINEMET  IR) 25-100 MG tablet Take 1 tablet by mouth in the morning and at bedtime.   conjugated estrogens  (PREMARIN ) vaginal cream Place 1 Applicatorful vaginally 3 (three) times a week.   desvenlafaxine (PRISTIQ) 25 MG 24 hr tablet Take 25 mg by mouth daily. (Patient taking differently: Take 50 mg by mouth daily.)   Docusate Calcium  (STOOL SOFTENER PO) Take 1 tablet by mouth in the morning and at bedtime.   donepezil  (ARICEPT ) 10 MG tablet Take 10 mg by mouth daily.   ENBREL 50 MG/ML injection Inject 50 mg into the skin once a week. Patient takes this medication on Mondays   fluconazole  (DIFLUCAN ) 150 MG tablet Take 150 mg by mouth as needed (Recurring UTI).   gabapentin  (NEURONTIN ) 300 MG capsule Take 300-600 mg by mouth See admin instructions. Take 300 mg in the morning and 600 mg at night   lamoTRIgine  (LAMICTAL ) 150 MG tablet Take 1 tablet by mouth 2 (two) times daily.   Lecanemab-irmb (LEQEMBI IV) Inject into the vein every 14 (fourteen) days.   levETIRAcetam  (KEPPRA ) 750 MG tablet Take 750 mg by mouth 2 (two) times daily.   levocetirizine (XYZAL ) 5 MG tablet Take 1 tablet (5 mg total) by mouth every evening. (Patient taking differently: Take 5 mg by mouth as needed for allergies.)   meclizine  (ANTIVERT ) 25 MG tablet Take 25 mg by mouth 3 (three) times daily as needed for dizziness.   memantine  (NAMENDA ) 10 MG tablet Take 10 mg by mouth 2 (two) times daily.   methenamine  (HIPREX ) 1 g tablet Take 1 tablet (1 g total) by mouth 2 (two) times daily with a meal.   Multiple Vitamins-Minerals (MULTIVITAMIN WITH MINERALS) tablet Take 1 tablet by mouth daily.   Omega-3 Fatty Acids (FISH OIL PO) Take 2 capsules by mouth daily.   omeprazole  (PRILOSEC)  40 MG capsule Take 1 capsule (40 mg total) by mouth daily.   ondansetron  (ZOFRAN -ODT) 8 MG disintegrating tablet Take 8 mg by mouth every 8 (eight) hours as needed for nausea or vomiting.   oxybutynin  (DITROPAN ) 5 MG tablet Take 5 mg by mouth 2 (two) times daily.   polyethylene glycol (MIRALAX  / GLYCOLAX ) 17 g packet Take 17 g by mouth 2 (two) times daily. (Patient taking differently: Take 17 g by mouth daily as needed for mild constipation.)   predniSONE  (DELTASONE ) 5 MG tablet Take 5 mg by mouth daily.   primidone  (MYSOLINE ) 50 MG tablet Take 100 mg by mouth 2 (two) times daily.   rizatriptan  (MAXALT ) 10 MG tablet Take 10 mg by mouth as needed for migraine. May repeat in 2 hours if needed   rosuvastatin  (CRESTOR ) 10 MG tablet Take 1 tablet (10 mg total) by mouth daily. (Patient taking differently: Take 10 mg by mouth at bedtime.)   sulfamethoxazole -trimethoprim  (BACTRIM ) 400-80 MG tablet Take 1 tablet by mouth 2 (two) times daily. (Start on 11/18)   traZODone  (DESYREL ) 100 MG tablet Take 2 tablets (200 mg total) by mouth at bedtime.   midodrine  (PROAMATINE ) 10 MG tablet Take 0.5 tablets (5 mg total) by mouth 2 (two) times daily with a meal.   tiZANidine  (ZANAFLEX ) 4 MG tablet Take 1 tablet (4 mg total) by mouth 3 (three)  times daily. (Patient not taking: Reported on 08/04/2024)   No facility-administered encounter medications on file as of 08/04/2024.     Patient Active Problem List   Diagnosis Date Noted   Sepsis secondary to UTI (HCC) 06/30/2024   Sepsis (HCC) 05/29/2024   Acute postoperative anemia due to expected blood loss 01/04/2024   Periprosthetic fracture around internal prosthetic right knee joint 01/02/2024   Primary localized osteoarthritis of right knee 10/19/2023   Seizure disorder (HCC) 07/28/2023   Parkinson disease (HCC) 07/28/2023   Aortic atherosclerosis 07/28/2023   Primary osteoarthritis involving multiple joints 06/20/2023   Long term (current) use of systemic  steroids 06/20/2023   High risk medication use 05/12/2023   Vitamin D  deficiency 05/12/2023   Tremor 02/11/2023   Fall 02/11/2023   Candidal skin infection 12/04/2022   Urinary incontinence 12/04/2022   GERD (gastroesophageal reflux disease) 10/22/2022   Hyperlipidemia associated with type 2 diabetes mellitus (HCC) 10/22/2022   Hyperparathyroidism 12/03/2020   Multiple thyroid  nodules 12/03/2020   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   Anemia, chronic disease 07/18/2020   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 07/18/2020   CAD (coronary artery disease) 06/04/2020   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 05/20/2020   DM type 2 (diabetes mellitus, type 2) (HCC) 11/14/2019   Anxiety    Obesity, Class II, BMI 35-39.9    Chronic kidney disease, stage 3b (HCC) - baseline Scr 1.4    Psoriatic arthritis (HCC)    Tobacco abuse    OSA (obstructive sleep apnea)    Neuropathy    Bipolar disorder (HCC)    Osteoarthritis of right hip 11/05/2014   Essential hypertension 10/29/2014     Health Maintenance Due  Topic Date Due   Diabetic kidney evaluation - Urine ACR  Never done   DTaP/Tdap/Td (1 - Tdap) Never done   Zoster Vaccines- Shingrix (1 of 2) Never done   COVID-19 Vaccine (2 - Pfizer risk series) 07/16/2020   FOOT EXAM  12/04/2023   OPHTHALMOLOGY EXAM  01/20/2024   Medicare Annual Wellness (AWV)  02/10/2024   HEMOGLOBIN A1C  04/19/2024     Review of Systems  Physical Exam   BP (!) 99/57   Pulse 89   Temp 97.8 F (36.6 C) (Temporal)   Ht 5' 2 (1.575 m)   Wt 212 lb (96.2 kg)   SpO2 98%   BMI 38.78 kg/m    No results found for: CD4TCELL No results found for: CD4TABS No results found for: HIV1RNAQUANT No results found for: HEPBSAB No results found for: RPR, LABRPR  CBC Lab Results  Component Value Date   WBC 10.0 07/07/2024   RBC 3.32 (L) 07/07/2024   HGB 10.1 (L) 07/07/2024   HCT  31.8 (L) 07/07/2024   PLT 221 07/07/2024   MCV 95.8 07/07/2024   MCH 30.4 07/07/2024   MCHC 31.8 (L) 07/07/2024   RDW 15.0 07/07/2024   LYMPHSABS 0.9 07/04/2024   MONOABS 0.5 07/04/2024   EOSABS 440 07/07/2024    BMET Lab Results  Component Value Date   NA 142 07/07/2024   K 4.3 07/07/2024   CL 105 07/07/2024   CO2 28 07/07/2024   GLUCOSE 100 (H) 07/07/2024   BUN 25 07/07/2024   CREATININE 1.83 (H) 07/07/2024   CALCIUM  8.5 (L) 07/07/2024   GFRNONAA 42 (L) 07/04/2024   GFRAA 38 (L) 05/15/2020      Assessment and Plan  Nitrofurantoin . Plus ua and urine  cx  2nd day of dysuria

## 2024-08-04 NOTE — Patient Instructions (Signed)
 Stop using hipprex while we treat your uti for the next 7 days. Then can restart taking it

## 2024-08-05 LAB — CBC WITH DIFFERENTIAL/PLATELET
Absolute Lymphocytes: 1113 {cells}/uL (ref 850–3900)
Absolute Monocytes: 754 {cells}/uL (ref 200–950)
Basophils Absolute: 46 {cells}/uL (ref 0–200)
Basophils Relative: 0.5 %
Eosinophils Absolute: 386 {cells}/uL (ref 15–500)
Eosinophils Relative: 4.2 %
HCT: 39.5 % (ref 35.9–46.0)
Hemoglobin: 12.7 g/dL (ref 11.7–15.5)
MCH: 30.3 pg (ref 27.0–33.0)
MCHC: 32.2 g/dL (ref 31.6–35.4)
MCV: 94.3 fL (ref 81.4–101.7)
MPV: 10.3 fL (ref 7.5–12.5)
Monocytes Relative: 8.2 %
Neutro Abs: 6900 {cells}/uL (ref 1500–7800)
Neutrophils Relative %: 75 %
Platelets: 244 Thousand/uL (ref 140–400)
RBC: 4.19 Million/uL (ref 3.80–5.10)
RDW: 14.3 % (ref 11.0–15.0)
Total Lymphocyte: 12.1 %
WBC: 9.2 Thousand/uL (ref 3.8–10.8)

## 2024-08-05 LAB — BASIC METABOLIC PANEL WITH GFR
BUN/Creatinine Ratio: 21 (calc) (ref 6–22)
BUN: 35 mg/dL — ABNORMAL HIGH (ref 7–25)
CO2: 26 mmol/L (ref 20–32)
Calcium: 9.3 mg/dL (ref 8.6–10.4)
Chloride: 103 mmol/L (ref 98–110)
Creat: 1.69 mg/dL — ABNORMAL HIGH (ref 0.50–1.05)
Glucose, Bld: 93 mg/dL (ref 65–99)
Potassium: 3.9 mmol/L (ref 3.5–5.3)
Sodium: 140 mmol/L (ref 135–146)
eGFR: 34 mL/min/1.73m2 — ABNORMAL LOW (ref >=60)

## 2024-08-05 LAB — URINE CULTURE
MICRO NUMBER:: 17350736
SPECIMEN QUALITY:: ADEQUATE

## 2024-08-05 LAB — URINALYSIS, ROUTINE W REFLEX MICROSCOPIC
Bilirubin Urine: NEGATIVE
Glucose, UA: NEGATIVE
Hgb urine dipstick: NEGATIVE
Hyaline Cast: NONE SEEN /LPF
Nitrite: POSITIVE — AB
Specific Gravity, Urine: 1.026 (ref 1.001–1.035)
WBC, UA: >=60 /HPF — AB (ref 0–5)
pH: 5.5 (ref 5.0–8.0)

## 2024-08-05 LAB — MICROSCOPIC MESSAGE

## 2024-08-31 ENCOUNTER — Other Ambulatory Visit: Payer: Self-pay | Admitting: Urology

## 2024-09-20 ENCOUNTER — Encounter (HOSPITAL_COMMUNITY): Payer: Self-pay

## 2024-09-20 NOTE — Pre-Procedure Instructions (Signed)
 Surgical Instructions   Your procedure is scheduled on September 25, 2024. Report to [facility] Main Entrance A at 10:30 A.M., then check in with the Admitting office. Any questions or running late day of surgery: call [phone]  Questions prior to your surgery date: call [phone], Monday-Friday, 8am-4pm. If you experience any cold or flu symptoms such as cough, fever, chills, shortness of breath, etc. between now and your scheduled surgery, please notify us  at the above number.     Remember:  Do not eat or drink after midnight the night before your surgery    Take these medicines the morning of surgery with A SIP OF WATER : carbidopa -levodopa  (SINEMET  IR)  desvenlafaxine (PRISTIQ)  donepezil  (ARICEPT )  gabapentin  (NEURONTIN )  lamoTRIgine  (LAMICTAL )  levETIRAcetam  (KEPPRA )  levocetirizine (XYZAL )  memantine  (NAMENDA )  methenamine  (HIPREX )  mometasone (NASONEX) nasal spray omeprazole  (PRILOSEC)   oxybutynin  (DITROPAN )  predniSONE  (DELTASONE )  primidone  (MYSOLINE )  tamsulosin (FLOMAX)  tiZANidine  (ZANAFLEX )    May take these medicines IF NEEDED: fluconazole  (DIFLUCAN )  meclizine  (ANTIVERT )  nitroGLYCERIN  (NITROSTAT ) - if dose taken prior to surgery, please call [phone] ondansetron  (ZOFRAN -ODT)  phenazopyridine  (PYRIDIUM )  rizatriptan  (MAXALT )    Follow your surgeon's instructions on when to stop Asprin.  If no instructions were given by your surgeon then you will need to call the office to get those instructions.    Please follow your prescribing physician's instructions regarding stopping your ENBREL injection.   One week prior to surgery, STOP taking any Aleve, Naproxen, Ibuprofen, Motrin, Advil, Goody's, BC's, all herbal medications, Omega-3 Fatty Acids (FISH OIL PO), and non-prescription vitamins.   HOW TO MANAGE YOUR DIABETES BEFORE AND AFTER SURGERY  Why is it important to control my blood sugar before and after surgery? Improving blood sugar  levels before and after surgery helps healing and can limit problems. A way of improving blood sugar control is eating a healthy diet by:  Eating less sugar and carbohydrates  Increasing activity/exercise  Talking with your doctor about reaching your blood sugar goals High blood sugars (greater than 180 mg/dL) can raise your risk of infections and slow your recovery, so you will need to focus on controlling your diabetes during the weeks before surgery. Make sure that the doctor who takes care of your diabetes knows about your planned surgery including the date and location.  How do I manage my blood sugar before surgery? Check your blood sugar at least 4 times a day, starting 2 days before surgery, to make sure that the level is not too high or low.  Check your blood sugar the morning of your surgery when you wake up and every 2 hours until you get to the Short Stay unit.  If your blood sugar is less than 70 mg/dL, you will need to treat for low blood sugar: Do not take insulin . Treat a low blood sugar (less than 70 mg/dL) with  cup of clear juice (cranberry or apple), 4 glucose tablets, OR glucose gel. Recheck blood sugar in 15 minutes after treatment (to make sure it is greater than 70 mg/dL). If your blood sugar is not greater than 70 mg/dL on recheck, call [phone] for further instructions. Report your blood sugar to the short stay nurse when you get to Short Stay.  If you are admitted to the hospital after surgery: Your blood sugar will be checked by the staff and you will probably be given insulin  after surgery (instead of oral diabetes medicines) to make sure you have good blood sugar  levels. The goal for blood sugar control after surgery is 80-180 mg/dL.                      Do NOT Smoke (Tobacco/Vaping) for 24 hours prior to your procedure.  If you use a CPAP at night, you may bring your mask/headgear for your overnight stay.   You will be asked to remove any contacts,  glasses, piercing's, hearing aid's, dentures/partials prior to surgery. Please bring cases for these items if needed.    Your surgeon will determine if you are to be admitted or discharged the same day.  Patients discharged the day of surgery will not be allowed to drive home, and someone needs to stay with them for 24 hours.  SURGICAL WAITING ROOM VISITATION Patients may have no more than 2 support people in the waiting area - these visitors may rotate.   Pre-op nurse will coordinate an appropriate time for 2 ADULT support persons, who may not rotate, to accompany patient in pre-op.  Children under the age of 43 must have an adult with them who is not the patient and must remain in the main waiting area with an adult.  If the patient needs to stay at the hospital during part of their recovery, the visitor guidelines for inpatient rooms apply.  Please refer to the [facility] website for the visitor guidelines for any additional information.   If you received a COVID test during your pre-op visit  it is requested that you wear a mask when out in public, stay away from anyone that may not be feeling well and notify your surgeon if you develop symptoms. If you have been in contact with anyone that has tested positive in the last 10 days please notify you surgeon.      Pre-operative CHG Bathing Instructions   You can play a key role in reducing the risk of infection after surgery. Your skin needs to be as free of germs as possible. You can reduce the number of germs on your skin by washing with CHG (chlorhexidine  gluconate) soap before surgery. CHG is an antiseptic soap that kills germs and continues to kill germs even after washing.   DO NOT use if you have an allergy to chlorhexidine /CHG or antibacterial soaps. If your skin becomes reddened or irritated, stop using the CHG and notify one of our RNs at [phone].              TAKE A SHOWER THE NIGHT BEFORE SURGERY   Please keep in mind  the following:  DO NOT shave, including legs and underarms, 48 hours prior to surgery.   You may shave your face before/day of surgery.  Place clean sheets on your bed the night before surgery Use a clean washcloth (not used since being washed) for shower. DO NOT sleep with pet's night before surgery.  CHG Shower Instructions:  Wash your face and private area with normal soap. If you choose to wash your hair, wash first with your normal shampoo.  After you use shampoo/soap, rinse your hair and body thoroughly to remove shampoo/soap residue.  Turn the water  OFF and apply half the bottle of CHG soap to a CLEAN washcloth.  Apply CHG soap ONLY FROM YOUR NECK DOWN TO YOUR TOES (washing for 3-5 minutes)  DO NOT use CHG soap on face, private areas, open wounds, or sores.  Pay special attention to the area where your surgery is being performed.  If you are  having back surgery, having someone wash your back for you may be helpful. Wait 2 minutes after CHG soap is applied, then you may rinse off the CHG soap.  Pat dry with a clean towel  Put on clean pajamas    Additional instructions for the day of surgery: If you choose, you may shower the morning of surgery with an antibacterial soap.  DO NOT APPLY any lotions, deodorants, cologne, or perfumes.   Do not wear jewelry or makeup Do not wear nail polish, gel polish, artificial nails, or any other type of covering on natural nails (fingers and toes) Do not bring valuables to the hospital. [facility] is not responsible for valuables/personal belongings. Put on clean/comfortable clothes.  Please brush your teeth.  Ask your nurse before applying any prescription medications to the skin.

## 2024-09-21 ENCOUNTER — Encounter (HOSPITAL_COMMUNITY): Payer: Self-pay

## 2024-09-21 ENCOUNTER — Other Ambulatory Visit: Payer: Self-pay

## 2024-09-21 ENCOUNTER — Encounter (HOSPITAL_COMMUNITY)
Admission: RE | Admit: 2024-09-21 | Discharge: 2024-09-21 | Disposition: A | Discharge status: Home / Self Care | Source: Ambulatory Visit | Attending: Urology | Admitting: Urology

## 2024-09-21 VITALS — BP 121/57 | HR 82 | Temp 98.4°F | Resp 18 | Ht 62.0 in | Wt 212.0 lb

## 2024-09-21 DIAGNOSIS — D631 Anemia in chronic kidney disease: Secondary | ICD-10-CM | POA: Insufficient documentation

## 2024-09-21 DIAGNOSIS — I129 Hypertensive chronic kidney disease with stage 1 through stage 4 chronic kidney disease, or unspecified chronic kidney disease: Secondary | ICD-10-CM | POA: Diagnosis not present

## 2024-09-21 DIAGNOSIS — G4733 Obstructive sleep apnea (adult) (pediatric): Secondary | ICD-10-CM | POA: Diagnosis not present

## 2024-09-21 DIAGNOSIS — K219 Gastro-esophageal reflux disease without esophagitis: Secondary | ICD-10-CM | POA: Insufficient documentation

## 2024-09-21 DIAGNOSIS — Z01818 Encounter for other preprocedural examination: Secondary | ICD-10-CM

## 2024-09-21 DIAGNOSIS — E66812 Obesity, class 2: Secondary | ICD-10-CM | POA: Insufficient documentation

## 2024-09-21 DIAGNOSIS — F319 Bipolar disorder, unspecified: Secondary | ICD-10-CM | POA: Insufficient documentation

## 2024-09-21 DIAGNOSIS — Z01812 Encounter for preprocedural laboratory examination: Secondary | ICD-10-CM | POA: Diagnosis present

## 2024-09-21 DIAGNOSIS — F419 Anxiety disorder, unspecified: Secondary | ICD-10-CM | POA: Insufficient documentation

## 2024-09-21 DIAGNOSIS — G40909 Epilepsy, unspecified, not intractable, without status epilepticus: Secondary | ICD-10-CM | POA: Insufficient documentation

## 2024-09-21 DIAGNOSIS — E1140 Type 2 diabetes mellitus with diabetic neuropathy, unspecified: Secondary | ICD-10-CM | POA: Insufficient documentation

## 2024-09-21 DIAGNOSIS — E785 Hyperlipidemia, unspecified: Secondary | ICD-10-CM | POA: Diagnosis not present

## 2024-09-21 DIAGNOSIS — L4050 Arthropathic psoriasis, unspecified: Secondary | ICD-10-CM | POA: Insufficient documentation

## 2024-09-21 DIAGNOSIS — G20A1 Parkinson's disease without dyskinesia, without mention of fluctuations: Secondary | ICD-10-CM | POA: Diagnosis not present

## 2024-09-21 DIAGNOSIS — Z6838 Body mass index (BMI) 38.0-38.9, adult: Secondary | ICD-10-CM | POA: Diagnosis not present

## 2024-09-21 DIAGNOSIS — N1832 Chronic kidney disease, stage 3b: Secondary | ICD-10-CM | POA: Diagnosis not present

## 2024-09-21 DIAGNOSIS — I252 Old myocardial infarction: Secondary | ICD-10-CM | POA: Insufficient documentation

## 2024-09-21 DIAGNOSIS — I2510 Atherosclerotic heart disease of native coronary artery without angina pectoris: Secondary | ICD-10-CM | POA: Diagnosis not present

## 2024-09-21 DIAGNOSIS — E1122 Type 2 diabetes mellitus with diabetic chronic kidney disease: Secondary | ICD-10-CM | POA: Insufficient documentation

## 2024-09-21 DIAGNOSIS — Z79899 Other long term (current) drug therapy: Secondary | ICD-10-CM | POA: Diagnosis not present

## 2024-09-21 HISTORY — DX: Personal history of urinary calculi: Z87.442

## 2024-09-21 HISTORY — DX: Gastro-esophageal reflux disease without esophagitis: K21.9

## 2024-09-21 HISTORY — DX: Unspecified convulsions: R56.9

## 2024-09-21 HISTORY — DX: Atherosclerotic heart disease of native coronary artery without angina pectoris: I25.10

## 2024-09-21 LAB — CBC
HCT: 38.6 % (ref 36.0–46.0)
Hemoglobin: 12.5 g/dL (ref 12.0–15.0)
MCH: 30.7 pg (ref 26.0–34.0)
MCHC: 32.4 g/dL (ref 30.0–36.0)
MCV: 94.8 fL (ref 80.0–100.0)
Platelets: 236 10*3/uL (ref 150–400)
RBC: 4.07 MIL/uL (ref 3.87–5.11)
RDW: 14.6 % (ref 11.5–15.5)
WBC: 9.5 10*3/uL (ref 4.0–10.5)
nRBC: 0 % (ref 0.0–0.2)

## 2024-09-21 LAB — BASIC METABOLIC PANEL WITH GFR
Anion gap: 11 (ref 5–15)
BUN: 22 mg/dL (ref 8–23)
CO2: 27 mmol/L (ref 22–32)
Calcium: 9.4 mg/dL (ref 8.9–10.3)
Chloride: 104 mmol/L (ref 98–111)
Creatinine, Ser: 1.64 mg/dL — ABNORMAL HIGH (ref 0.44–1.00)
GFR, Estimated: 35 mL/min — ABNORMAL LOW
Glucose, Bld: 92 mg/dL (ref 70–99)
Potassium: 4.8 mmol/L (ref 3.5–5.1)
Sodium: 141 mmol/L (ref 135–145)

## 2024-09-21 LAB — HEMOGLOBIN A1C
Hgb A1c MFr Bld: 5.3 % (ref 4.8–5.6)
Mean Plasma Glucose: 105.41 mg/dL

## 2024-09-21 NOTE — Progress Notes (Signed)
 PCP - Tylene Samie RIGGERS Cardiologist - Gayatri Acharya,MD- LOV 04/03/24.Follow up in 1 year.  PPM/ICD - denies Device Orders -  Rep Notified -   Chest x-ray - 06/30/24 EKG - 06/30/24 Stress Test - denies ECHO - 03/01/24 Cardiac Cath - 11/14/19  Sleep Study - +OSA CPAP - yes,uses it nightly  Fasting Blood Sugar - pt denies diabetes Checks Blood Sugar _____ times a day  Last dose of GLP1 agonist-  Patient last took Zepbound 09/11/24. She says she does not have anymore of this med and cannot afford to get it so she will not be taking it prior to surgery.  GLP1 instructions: no  Blood Thinner Instructions:na Aspirin  Instructions:  ERAS Protcol -NPO PRE-SURGERY Ensure or G2-   COVID TEST- na   Anesthesia review: yes-hx CAD,MI,stroke,Parkinson's CKD. OSA,Cr-1.64  Patient denies shortness of breath, fever, cough and chest pain at PAT appointment   All instructions explained to the patient, with a verbal understanding of the material. Patient agrees to go over the instructions while at home for a better understanding. The opportunity to ask questions was provided.

## 2024-09-22 NOTE — Progress Notes (Signed)
 Anesthesia Chart Review:  64 y.o. 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  Echo 02/2024 showed LVEF 65 to 70%, normal diastolic parameters, normal RV, no significant valvular abnormalities.  Cath 10/2019 showed mild nonobstructive CAD.  Preop labs reviewed, creatinine elevated 1.64 consistent with history of CKD, otherwise WNL.  Patient reports last dose of Zepbound 09/11/2024.  EKG 06/30/2024: NSR.  Rate 96.  LAD.  TTE 03/01/2024:  1. Left ventricular ejection fraction, by estimation, is 65 to 70%. Left  ventricular ejection fraction by 3D volume is 70 %. The left ventricle has  normal function. The left ventricle has no regional wall motion  abnormalities. Left ventricular diastolic   parameters were normal. The average left ventricular global longitudinal  strain is -24.4 %. The global longitudinal strain is normal.   2. Right ventricular systolic function is normal. The right ventricular  size is normal.   3. The mitral valve is normal in structure. Trivial mitral valve  regurgitation. No evidence of mitral stenosis.   4. The aortic valve is normal in structure. Aortic valve regurgitation is  not visualized. No aortic stenosis is present.   5. The inferior vena cava is normal in size with greater than 50%  respiratory variability, suggesting right atrial pressure of 3 mmHg.   Cath 11/14/2019: Prox LAD to Mid LAD lesion is 30% stenosed. Prox RCA lesion is 25% stenosed. LV end diastolic pressure is moderately elevated.   1. Mild nonobstructive CAD. 2. Moderately elevated LV EDP   Plan: medical management.  Assess LV function by Echo. Potential etiology for chest pain include hypertensive heart disease, stress induced CM, or aortic pathology.     Lynwood Geofm RIGGERS [facility] Short Stay Center/Anesthesiology Phone [phone] 09/22/2024 11:00 AM

## 2024-09-22 NOTE — Anesthesia Preprocedure Evaluation (Signed)
"                                    Anesthesia Evaluation    Airway        Dental   Pulmonary former smoker          Cardiovascular      Neuro/Psych    GI/Hepatic   Endo/Other  diabetes    Renal/GU      Musculoskeletal   Abdominal   Peds  Hematology   Anesthesia Other Findings   Reproductive/Obstetrics                              Anesthesia Physical Anesthesia Plan  ASA:   Anesthesia Plan:    Post-op Pain Management:    Induction:   PONV Risk Score and Plan:   Airway Management Planned:   Additional Equipment:   Intra-op Plan:   Post-operative Plan:   Informed Consent:   Plan Discussed with:   Anesthesia Plan Comments: (PAT note by Lynwood Hope, PA-C: 64 y.o. 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  Echo 02/2024 showed LVEF 65 to 70%, normal diastolic parameters, normal RV, no significant valvular abnormalities.  Cath 10/2019 showed mild nonobstructive CAD.  Preop labs reviewed, creatinine elevated 1.64 consistent with history of CKD, otherwise WNL.  Patient reports last dose of Zepbound 09/11/2024.  EKG 06/30/2024: NSR.  Rate 96.  LAD.  TTE 03/01/2024:  1. Left ventricular ejection fraction, by estimation, is 65 to 70%. Left  ventricular ejection fraction by 3D volume is 70 %. The left ventricle has  normal function. The left ventricle has no regional wall motion  abnormalities. Left ventricular diastolic   parameters were normal. The average left ventricular global longitudinal  strain is -24.4 %. The global longitudinal strain is  normal.   2. Right ventricular systolic function is normal. The right ventricular  size is normal.   3. The mitral valve is normal in structure. Trivial mitral valve  regurgitation. No evidence of mitral stenosis.   4. The aortic valve is normal in structure. Aortic valve regurgitation is  not visualized. No aortic stenosis is present.   5. The inferior vena cava is normal in size with greater than 50%  respiratory variability, suggesting right atrial pressure of 3 mmHg.   Cath 11/14/2019:  Prox LAD to Mid LAD lesion is 30% stenosed.  Prox RCA lesion is 25% stenosed.  LV end diastolic pressure is moderately elevated.   1. Mild nonobstructive CAD. 2. Moderately elevated LV EDP   Plan: medical management. Assess LV function by Echo. Potential etiology for chest pain include hypertensive heart disease, stress induced CM, or aortic pathology.   )         Anesthesia Quick Evaluation  "

## 2024-09-24 NOTE — Progress Notes (Signed)
 Pt. Called stated she is cancelling surgery due to the weather. Instructed pt. To call Dr. Cira office.

## 2024-09-25 ENCOUNTER — Ambulatory Visit (HOSPITAL_COMMUNITY): Admission: RE | Admit: 2024-09-25 | Payer: Self-pay | Source: Home / Self Care | Admitting: Urology

## 2024-09-25 ENCOUNTER — Encounter (HOSPITAL_COMMUNITY): Admission: RE | Payer: Self-pay | Source: Home / Self Care

## 2024-09-25 ENCOUNTER — Encounter (HOSPITAL_COMMUNITY): Payer: Self-pay | Admitting: Physician Assistant

## 2024-10-06 ENCOUNTER — Encounter (HOSPITAL_COMMUNITY): Admission: RE | Payer: Self-pay | Source: Home / Self Care

## 2024-10-06 ENCOUNTER — Ambulatory Visit (HOSPITAL_COMMUNITY): Admission: RE | Admit: 2024-10-06 | Source: Home / Self Care | Admitting: Urology

## 2024-11-02 ENCOUNTER — Ambulatory Visit: Payer: Self-pay | Admitting: Internal Medicine
